# Patient Record
Sex: Female | Born: 1965
Health system: Southern US, Community
[De-identification: ages and names within clinical notes are randomized; demographics above are authoritative.]

## PROBLEM LIST (undated history)

## (undated) DIAGNOSIS — Z8719 Personal history of other diseases of the digestive system: Secondary | ICD-10-CM

## (undated) DIAGNOSIS — E119 Type 2 diabetes mellitus without complications: Secondary | ICD-10-CM

## (undated) DIAGNOSIS — E538 Deficiency of other specified B group vitamins: Secondary | ICD-10-CM

## (undated) DIAGNOSIS — F329 Major depressive disorder, single episode, unspecified: Secondary | ICD-10-CM

## (undated) DIAGNOSIS — F32A Depression, unspecified: Secondary | ICD-10-CM

## (undated) DIAGNOSIS — K56609 Unspecified intestinal obstruction, unspecified as to partial versus complete obstruction: Secondary | ICD-10-CM

## (undated) DIAGNOSIS — J9610 Chronic respiratory failure, unspecified whether with hypoxia or hypercapnia: Secondary | ICD-10-CM

## (undated) DIAGNOSIS — G4730 Sleep apnea, unspecified: Secondary | ICD-10-CM

## (undated) DIAGNOSIS — M1990 Unspecified osteoarthritis, unspecified site: Secondary | ICD-10-CM

## (undated) DIAGNOSIS — Z8489 Family history of other specified conditions: Secondary | ICD-10-CM

## (undated) DIAGNOSIS — E785 Hyperlipidemia, unspecified: Secondary | ICD-10-CM

## (undated) DIAGNOSIS — T4145XA Adverse effect of unspecified anesthetic, initial encounter: Secondary | ICD-10-CM

## (undated) DIAGNOSIS — M479 Spondylosis, unspecified: Secondary | ICD-10-CM

## (undated) DIAGNOSIS — G43719 Chronic migraine without aura, intractable, without status migrainosus: Secondary | ICD-10-CM

## (undated) DIAGNOSIS — G4733 Obstructive sleep apnea (adult) (pediatric): Secondary | ICD-10-CM

## (undated) DIAGNOSIS — K219 Gastro-esophageal reflux disease without esophagitis: Secondary | ICD-10-CM

## (undated) DIAGNOSIS — K2270 Barrett's esophagus without dysplasia: Secondary | ICD-10-CM

## (undated) DIAGNOSIS — Z9884 Bariatric surgery status: Secondary | ICD-10-CM

## (undated) DIAGNOSIS — F319 Bipolar disorder, unspecified: Secondary | ICD-10-CM

## (undated) DIAGNOSIS — G43909 Migraine, unspecified, not intractable, without status migrainosus: Secondary | ICD-10-CM

## (undated) DIAGNOSIS — J189 Pneumonia, unspecified organism: Secondary | ICD-10-CM

## (undated) DIAGNOSIS — F419 Anxiety disorder, unspecified: Secondary | ICD-10-CM

## (undated) DIAGNOSIS — D649 Anemia, unspecified: Secondary | ICD-10-CM

## (undated) DIAGNOSIS — I10 Essential (primary) hypertension: Secondary | ICD-10-CM

## (undated) DIAGNOSIS — E559 Vitamin D deficiency, unspecified: Secondary | ICD-10-CM

## (undated) DIAGNOSIS — K5909 Other constipation: Secondary | ICD-10-CM

## (undated) DIAGNOSIS — H04129 Dry eye syndrome of unspecified lacrimal gland: Secondary | ICD-10-CM

## (undated) DIAGNOSIS — E039 Hypothyroidism, unspecified: Secondary | ICD-10-CM

## (undated) DIAGNOSIS — K3184 Gastroparesis: Secondary | ICD-10-CM

## (undated) HISTORY — DX: Obstructive sleep apnea (adult) (pediatric): G47.33

## (undated) HISTORY — DX: Sleep apnea, unspecified: G47.30

## (undated) HISTORY — DX: Chronic migraine without aura, intractable, without status migrainosus: G43.719

## (undated) HISTORY — DX: Pneumonia, unspecified organism: J18.9

## (undated) HISTORY — DX: Chronic respiratory failure, unspecified whether with hypoxia or hypercapnia: J96.10

## (undated) HISTORY — PX: TONSILLECTOMY: SUR1361

## (undated) HISTORY — DX: Barrett's esophagus without dysplasia: K22.70

## (undated) HISTORY — DX: Migraine, unspecified, not intractable, without status migrainosus: G43.909

## (undated) HISTORY — DX: Vitamin D deficiency, unspecified: E55.9

## (undated) HISTORY — PX: HERNIA REPAIR: SHX51

## (undated) HISTORY — DX: Other constipation: K59.09

## (undated) HISTORY — DX: Deficiency of other specified B group vitamins: E53.8

## (undated) HISTORY — DX: Hypothyroidism, unspecified: E03.9

## (undated) HISTORY — DX: Morbid (severe) obesity due to excess calories: E66.01

## (undated) HISTORY — DX: Hyperlipidemia, unspecified: E78.5

## (undated) HISTORY — DX: Dry eye syndrome of unspecified lacrimal gland: H04.129

## (undated) HISTORY — PX: COLONOSCOPY: SHX174

## (undated) HISTORY — DX: Unspecified osteoarthritis, unspecified site: M19.90

## (undated) HISTORY — DX: Gastroparesis: K31.84

## (undated) HISTORY — DX: Essential (primary) hypertension: I10

## (undated) HISTORY — DX: Bariatric surgery status: Z98.84

## (undated) HISTORY — PX: GASTROSTOMY TUBE PLACEMENT: SHX655

## (undated) HISTORY — DX: Bipolar disorder, unspecified: F31.9

---
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
## 1999-05-31 ENCOUNTER — Emergency Department (HOSPITAL_COMMUNITY): Admission: EM | Admit: 1999-05-31 | Discharge: 1999-05-31 | Payer: Self-pay | Admitting: Emergency Medicine

## 2003-11-20 ENCOUNTER — Other Ambulatory Visit: Admission: RE | Admit: 2003-11-20 | Discharge: 2003-11-20 | Payer: Self-pay | Admitting: Obstetrics and Gynecology

## 2003-12-13 HISTORY — PX: CHOLECYSTECTOMY: SHX55

## 2004-02-27 ENCOUNTER — Ambulatory Visit (HOSPITAL_COMMUNITY): Admission: RE | Admit: 2004-02-27 | Discharge: 2004-02-27 | Payer: Self-pay | Admitting: Family Medicine

## 2004-03-17 ENCOUNTER — Ambulatory Visit (HOSPITAL_COMMUNITY): Admission: RE | Admit: 2004-03-17 | Discharge: 2004-03-17 | Payer: Self-pay

## 2004-03-17 ENCOUNTER — Encounter (INDEPENDENT_AMBULATORY_CARE_PROVIDER_SITE_OTHER): Payer: Self-pay | Admitting: *Deleted

## 2004-09-16 ENCOUNTER — Ambulatory Visit (HOSPITAL_COMMUNITY): Admission: RE | Admit: 2004-09-16 | Discharge: 2004-09-16 | Payer: Self-pay | Admitting: Internal Medicine

## 2004-10-19 ENCOUNTER — Ambulatory Visit: Payer: Self-pay | Admitting: Gastroenterology

## 2004-10-25 ENCOUNTER — Ambulatory Visit (HOSPITAL_COMMUNITY): Admission: RE | Admit: 2004-10-25 | Discharge: 2004-10-25 | Payer: Self-pay | Admitting: Gastroenterology

## 2004-11-22 ENCOUNTER — Other Ambulatory Visit: Admission: RE | Admit: 2004-11-22 | Discharge: 2004-11-22 | Payer: Self-pay | Admitting: Obstetrics and Gynecology

## 2004-12-14 ENCOUNTER — Ambulatory Visit: Payer: Self-pay | Admitting: Gastroenterology

## 2004-12-20 ENCOUNTER — Ambulatory Visit: Payer: Self-pay | Admitting: Gastroenterology

## 2004-12-29 ENCOUNTER — Ambulatory Visit: Payer: Self-pay | Admitting: Gastroenterology

## 2005-01-06 ENCOUNTER — Ambulatory Visit: Payer: Self-pay | Admitting: Gastroenterology

## 2005-01-11 ENCOUNTER — Ambulatory Visit (HOSPITAL_COMMUNITY): Admission: RE | Admit: 2005-01-11 | Discharge: 2005-01-11 | Payer: Self-pay | Admitting: Gastroenterology

## 2005-02-08 ENCOUNTER — Ambulatory Visit: Payer: Self-pay | Admitting: Gastroenterology

## 2005-06-16 ENCOUNTER — Other Ambulatory Visit: Admission: RE | Admit: 2005-06-16 | Discharge: 2005-06-16 | Payer: Self-pay | Admitting: Obstetrics and Gynecology

## 2005-07-25 ENCOUNTER — Ambulatory Visit: Admission: RE | Admit: 2005-07-25 | Discharge: 2005-07-25 | Payer: Self-pay | Admitting: Family Medicine

## 2005-07-27 ENCOUNTER — Ambulatory Visit: Payer: Self-pay | Admitting: Pulmonary Disease

## 2005-08-19 ENCOUNTER — Ambulatory Visit (HOSPITAL_COMMUNITY): Admission: RE | Admit: 2005-08-19 | Discharge: 2005-08-19 | Payer: Self-pay | Admitting: Family Medicine

## 2005-11-09 ENCOUNTER — Encounter: Admission: RE | Admit: 2005-11-09 | Discharge: 2005-11-28 | Payer: Self-pay | Admitting: Orthopedic Surgery

## 2005-12-01 ENCOUNTER — Other Ambulatory Visit: Admission: RE | Admit: 2005-12-01 | Discharge: 2005-12-01 | Payer: Self-pay | Admitting: Obstetrics and Gynecology

## 2010-07-14 ENCOUNTER — Encounter: Admission: RE | Admit: 2010-07-14 | Discharge: 2010-07-14 | Payer: Self-pay | Admitting: Family Medicine

## 2010-09-05 ENCOUNTER — Encounter: Payer: Self-pay | Admitting: Pulmonary Disease

## 2010-09-05 ENCOUNTER — Emergency Department (HOSPITAL_COMMUNITY): Admission: EM | Admit: 2010-09-05 | Discharge: 2010-09-05 | Payer: Self-pay | Admitting: Emergency Medicine

## 2010-09-05 ENCOUNTER — Ambulatory Visit: Admission: RE | Admit: 2010-09-05 | Discharge: 2010-09-05 | Payer: Self-pay | Admitting: Family Medicine

## 2010-09-09 ENCOUNTER — Ambulatory Visit (HOSPITAL_COMMUNITY): Admission: RE | Admit: 2010-09-09 | Discharge: 2010-09-09 | Payer: Self-pay | Admitting: Family Medicine

## 2010-09-27 ENCOUNTER — Ambulatory Visit: Payer: Self-pay | Admitting: Internal Medicine

## 2010-09-27 DIAGNOSIS — I10 Essential (primary) hypertension: Secondary | ICD-10-CM

## 2010-09-27 DIAGNOSIS — G43909 Migraine, unspecified, not intractable, without status migrainosus: Secondary | ICD-10-CM | POA: Insufficient documentation

## 2010-09-27 DIAGNOSIS — J9610 Chronic respiratory failure, unspecified whether with hypoxia or hypercapnia: Secondary | ICD-10-CM | POA: Insufficient documentation

## 2010-09-27 DIAGNOSIS — E1159 Type 2 diabetes mellitus with other circulatory complications: Secondary | ICD-10-CM

## 2010-09-27 DIAGNOSIS — G4733 Obstructive sleep apnea (adult) (pediatric): Secondary | ICD-10-CM | POA: Insufficient documentation

## 2010-11-08 ENCOUNTER — Ambulatory Visit: Payer: Self-pay | Admitting: Internal Medicine

## 2010-12-09 ENCOUNTER — Telehealth (INDEPENDENT_AMBULATORY_CARE_PROVIDER_SITE_OTHER): Payer: Self-pay | Admitting: *Deleted

## 2010-12-14 ENCOUNTER — Encounter: Payer: Self-pay | Admitting: Internal Medicine

## 2010-12-16 ENCOUNTER — Encounter: Payer: Self-pay | Admitting: Internal Medicine

## 2010-12-16 ENCOUNTER — Telehealth: Payer: Self-pay | Admitting: Internal Medicine

## 2010-12-27 ENCOUNTER — Ambulatory Visit
Admission: RE | Admit: 2010-12-27 | Discharge: 2010-12-27 | Payer: Self-pay | Source: Home / Self Care | Attending: Internal Medicine | Admitting: Internal Medicine

## 2010-12-27 ENCOUNTER — Telehealth (INDEPENDENT_AMBULATORY_CARE_PROVIDER_SITE_OTHER): Payer: Self-pay | Admitting: *Deleted

## 2010-12-31 ENCOUNTER — Ambulatory Visit
Admission: RE | Admit: 2010-12-31 | Discharge: 2010-12-31 | Payer: Self-pay | Source: Home / Self Care | Attending: Pulmonary Disease | Admitting: Pulmonary Disease

## 2010-12-31 DIAGNOSIS — G8929 Other chronic pain: Secondary | ICD-10-CM | POA: Insufficient documentation

## 2010-12-31 DIAGNOSIS — K3184 Gastroparesis: Secondary | ICD-10-CM | POA: Insufficient documentation

## 2010-12-31 DIAGNOSIS — M25562 Pain in left knee: Secondary | ICD-10-CM

## 2010-12-31 DIAGNOSIS — M25561 Pain in right knee: Secondary | ICD-10-CM

## 2010-12-31 DIAGNOSIS — J309 Allergic rhinitis, unspecified: Secondary | ICD-10-CM | POA: Insufficient documentation

## 2010-12-31 DIAGNOSIS — K2270 Barrett's esophagus without dysplasia: Secondary | ICD-10-CM | POA: Insufficient documentation

## 2011-01-13 NOTE — Progress Notes (Signed)
  Phone Note Other Incoming   Request: Send information Summary of Call: Request for records received from [employer]. Request forwarded to [employer]. 12/12/2008 to present. Pulmonary

## 2011-01-13 NOTE — Miscellaneous (Signed)
Summary: Orders Update  Clinical Lists Changes  Orders: Added new Referral order of DME Referral (DME) - Signed 

## 2011-01-13 NOTE — Progress Notes (Signed)
Summary: ?cpap/o2 >  check ono on cpap  Phone Note Call from Patient Call back at Home Phone [phone]   Caller: Patient Call For: wert Reason for Call: Talk to Nurse Summary of Call: Patient calling saying that when she wakes in the morning her chest feels tight and sore.  Patient on cpap and is wondering if adding oxygen at night along with cpap would help with this.   Initial call taken by: Lehman Prom,  December 09, 2010 8:13 AM  Follow-up for Phone Call        Pt staets she has been waking up in the morning and her chest has been really tight in the mornings. She states the tightness goes away after a few hours. Pt wears a cpap at night and wants to know if she needs to have oxygen bleed into the machine. It doesnt look like we ordered her cpap, pt states her pcp ordered it originally but she wants to ask Dr. Sherene Sires his pinion about the oxygen. Pt aware MW out of office and ok to wait for his response. Please advise.Carron Curie CMA  December 09, 2010 9:33 AM I ordered overnight 02 on cpap today but need to be sure she has ov next available to review meds/ blood pressure Follow-up by: Nyoka Cowden MD,  December 10, 2010 9:43 AM  Additional Follow-up for Phone Call Additional follow up Details #1::        Order faxed to [facility] to arrange ONO on CPAP. Pt is aware of this order. Rhonda Cobb  December 10, 2010 10:00 AM  Pt is sch for f/u on 12/27/2010 @ 10am w/ MW. Michel Bickers [facility]  December 10, 2010 10:09 AM     Appended Document: ?cpap/o2 >  check ono on cpap study done 12/13/09 shows 1h and 30 min desat on cpap/ RA so add 2lpm and referred to sleep doc

## 2011-01-13 NOTE — Assessment & Plan Note (Signed)
Summary: Pulmonary/ ext f/u ov with change lisnopril > diovan 160/25    Copy to:  Belva Agee, PA Primary Provider/Referring Provider:  Belva Agee, PA  CC:  DOE- improved.  History of Present Illness: 45  yowf never smoker with morbid obesity  placed 02 2  effective 08/2010  September 27, 2010  1st pulmonary office eval  new onset bilateral leg swelling x sev months assoc excess hypersomnolence and better since started CPAP 2 weeks.  eval with ct chest showing atx bases and referred for pulmonary evaluation by Belva Agee with Dr [facility] office.  swelling better.  doe x > room to room . rec Stop fish oil Incentive spirometry 4 x daily Please schedule a follow-up appointment in 6 weeks, sooner if needed with PFT's  Weight control  November 08, 2010 with pft's cc doe better, some sensation of pnds, cough, tickle in throat while on ace. Pt denies any significant sore throat, dysphagia, itching, sneezing,  nasal congestion or excess secretions,  fever, chills, sweats, unintended wt loss, pleuritic or exertional cp, hempoptysis, change in activity tolerance  orthopnea pnd or leg swelling. Pt also denies any obvious fluctuation in symptoms with weather or environmental change or other alleviating or aggravating factors.       Current Medications (verified): 1)  Lisinopril-Hydrochlorothiazide 20-25 Mg Tabs (Lisinopril-Hydrochlorothiazide) .Marland Kitchen.. 1 Once Daily 2)  Lasix 20 Mg Tabs (Furosemide) .Marland Kitchen.. 1 Once Daily 3)  Antara 130 Mg Caps (Fenofibrate Micronized) .Marland Kitchen.. 1 Once Daily 4)  Amitiza 24 Mcg Caps (Lubiprostone) .Marland Kitchen.. 1 Two Times A Day 5)  Ultram 50 Mg Tabs (Tramadol Hcl) .Marland Kitchen.. 1 To 2 Every 4-6 Hrs As Needed 6)  Ra One Daily Multi-Vitamin  Tabs (Multiple Vitamin) .Marland Kitchen.. 1 Once Daily 7)  Trazodone Hcl 100 Mg Tabs (Trazodone Hcl) .... 2 At Bedtime 8)  Omeprazole 40 Mg Cpdr (Omeprazole) .Marland Kitchen.. 1 Once Daily 9)  Diclofenac Sodium 75 Mg Tbec (Diclofenac Sodium) .Marland Kitchen.. 1 Two Times A Day With Meal 10)  Nyamyc  100000 Unit/gm Powd (Nystatin) .... Apply To Area Two Times A Day 11)  Hydroxyzine Hcl 25 Mg Tabs (Hydroxyzine Hcl) .Marland Kitchen.. 1 Three Times A Day 12)  Sudafed 30 Mg Tabs (Pseudoephedrine Hcl) .... Per Bottle Directions As Needed  Allergies (verified): 1)  ! Codeine 2)  ! * Nizoral 3)  ! * Statins  Past History:  Past Medical History: Mobid Obesity      -  weighed 175  2007  OSA  dx 08/2010 >> cpap and daytime 02 ordered by Dr Kathi Der office Dyspnea      - PFT's nl November 08, 2010 x low erv      - Sats ok on 2lpm walking November 08, 2010   Vital Signs:  Patient profile:   45 year old female Height:      66 inches Weight:      326 pounds BMI:     52.81 O2 Sat:      96 % on Room air Temp:     97.9 degrees F oral Pulse rate:   97 / minute BP sitting:   114 / 78  (left arm) Cuff size:   large  Vitals Entered By: Vernie Murders (November 08, 2010 10:47 AM)  O2 Flow:  Room air  Serial Vital Signs/Assessments:  Comments: 12:04 PM Ambulatory Pulse Oximetry  Resting; HR__99___    02 Sat__97%2lpm___  Lap1 (185 feet)   HR_126____   02 Sat__90%2lpm___ Lap2 (185 feet)   HR_101____   02  Sat__96%2lpm___    Lap3 (185 feet)   HR_____   02 Sat_____  ___Test Completed without Difficulty _x__Test Stopped due ZO:XWRU knee pain   By: Vernie Murders    Physical Exam  Additional Exam:  massively obese wf nad wt  345 September 27, 2010  >> 326 November 08, 2010  HEENT: nl dentition, turbinates, and orophanx. Nl external ear canals without cough reflex NECK :  without JVD/Nodes/TM/ nl carotid upstrokes bilaterally LUNGS: no acc muscle use, clear to A and P bilaterally without cough on insp or exp maneuvers CV:  RRR  no s3 or murmur or increase in P2, no edema  ABD:   massive with limited excursion   MS:  warm without deformities, calf tenderness, cyanosis or clubbing SKIN: warm and dry without lesions   NEURO:  alert, approp, no deficits     Impression &  Recommendations:  Problem # 1:  RESPIRATORY FAILURE, CHRONIC (ICD-518.83)  c/w obesity effects by pft's with nl lung vol and nl dlco but disproportionate reduction in erv and well compensated on 2lpm with activity and cpap at night  Orders: Est. Patient Level IV (99214) Pulse Oximetry, Ambulatory (04540)  Problem # 2:  HYPERTENSION (ICD-401.9)  The following medications were removed from the medication list:    Lisinopril-hydrochlorothiazide 20-25 Mg Tabs (Lisinopril-hydrochlorothiazide) .Marland Kitchen... 1 once daily Her updated medication list for this problem includes:    Lasix 20 Mg Tabs (Furosemide) .Marland Kitchen... 1 once daily    Diovan Hct 160-25 Mg Tabs (Valsartan-hydrochlorothiazide) ..... One by mouth daily  cough on ace is ace related until proven otherwise.  ACE inhibitors are probably fueling this problem because they inhibit the metabolism of Upper Air Bradykinin,  a potent mediator of upper airways inflammation. If they are the "fuel" then something else usually is the "spark" (eg viral uri, post nasal drainage, or reflux). In the era of ARB equivalency, at least in the short run, I recommend the ACE be stopped for a six week trial basis. If the problem resolves, the ACE could still be restarted depending on the severity of the severity of the original problem  Orders: Est. Patient Level IV (98119)  Medications Added to Medication List This Visit: 1)  Trazodone Hcl 100 Mg Tabs (Trazodone hcl) .... 2 at bedtime 2)  Sudafed 30 Mg Tabs (Pseudoephedrine hcl) .... Per bottle directions as needed 3)  Diovan Hct 160-25 Mg Tabs (Valsartan-hydrochlorothiazide) .... One by mouth daily  Patient Instructions: 1)  Keep working on your weight, you are making great progress 2)  Try Diovan 14782  one daily in place of the lisinopril for a 6 week free trial to see if the cough resolves 3)  Please schedule a follow-up appointment in 6 weeks, sooner if needed

## 2011-01-13 NOTE — Progress Notes (Signed)
Summary: returning call  Phone Note Call from Patient Call back at Home Phone [phone]   Caller: Patient Call For: Leila Schuff Reason for Call: Talk to Nurse Summary of Call: Patient returning call.  Patient says it could be about ONO. Initial call taken by: Lehman Prom,  December 16, 2010 11:45 AM  Follow-up for Phone Call        Pt states she had a message left on her vmail and was not sure who was calling. States she figured out it was Almyra Free to confirm her upcoming appointment with [facility]. No further action needed. Zackery Barefoot CMA  December 16, 2010 2:29 PM

## 2011-01-13 NOTE — Assessment & Plan Note (Signed)
Summary: Pulmonary/ final f/u ov for obesity    Copy to:  Belva Agee, PA Primary Provider/Referring Provider:  Belva Agee, PA  CC:  DOE- the same.  History of Present Illness: 71  yowf never smoker with morbid obesity  placed 02 2  effective 08/2010  September 27, 2010  1st pulmonary office eval  new onset bilateral leg swelling x sev months assoc excess hypersomnolence and better since started CPAP 2 weeks.  eval with ct chest showing atx bases and referred for pulmonary evaluation by Belva Agee with Dr [facility] office.  swelling better.  doe x > room to room . rec Stop fish oil Incentive spirometry 4 x daily Please schedule a follow-up appointment in 6 weeks, sooner if needed with PFT's  Weight control  November 08, 2010 with pft's cc doe better, some sensation of pnds, cough, tickle in throat while on ace Keep working on your weight, you are making great progress Try Diovan 16109  one daily in place of the lisinopril for a 6 week free trial to see if the cough resolves    December 27, 2010 ov c/o doe no cough.  Pt denies any significant sore throat, dysphagia, itching, sneezing,  nasal congestion or excess secretions,  fever, chills, sweats, unintended wt loss, pleuritic or exertional cp, hempoptysis, change in activity tolerance  orthopnea pnd or leg swelling    Current Medications (verified): 1)  Lasix 20 Mg Tabs (Furosemide) .Marland Kitchen.. 1 Once Daily 2)  Antara 130 Mg Caps (Fenofibrate Micronized) .Marland Kitchen.. 1 Once Daily 3)  Amitiza 24 Mcg Caps (Lubiprostone) .Marland Kitchen.. 1 Two Times A Day 4)  Ultram 50 Mg Tabs (Tramadol Hcl) .Marland Kitchen.. 1 To 2 Every 4-6 Hrs As Needed 5)  Ra One Daily Multi-Vitamin  Tabs (Multiple Vitamin) .Marland Kitchen.. 1 Once Daily 6)  Omeprazole 40 Mg Cpdr (Omeprazole) .Marland Kitchen.. 1 Once Daily 7)  Diclofenac Sodium 75 Mg Tbec (Diclofenac Sodium) .Marland Kitchen.. 1 Two Times A Day With Meal 8)  Nyamyc 100000 Unit/gm Powd (Nystatin) .... Apply To Area Two Times A Day 9)  Hydroxyzine Hcl 25 Mg Tabs (Hydroxyzine Hcl)  .Marland Kitchen.. 1 Three Times A Day 10)  Sudafed 30 Mg Tabs (Pseudoephedrine Hcl) .... Per Bottle Directions As Needed 11)  Diovan Hct 160-25 Mg  Tabs (Valsartan-Hydrochlorothiazide) .... One By Mouth Daily 12)  Vitamin D (Ergocalciferol) 50000 Unit Caps (Ergocalciferol) .Marland Kitchen.. 1 Every Wk  Allergies (verified): 1)  ! Codeine 2)  ! * Nizoral 3)  ! * Statins  Past History:  Past Medical History: Mobid Obesity      -  weighed 175  2007       -  5'  6" so [employer] wt  =   179  for BMI < 30  OSA  dx 08/2010 >> cpap and daytime 02 ordered by Dr Kathi Der office Dyspnea      - CTangiogram of chest neg for pulmonary emboli/ ild  09/09/10      - PFT's nl November 08, 2010 x low erv c/w effects of obesity      - Sats ok on 2lpm walking November 08, 2010  Cough     - Resolved off ace December 27, 2010   Vital Signs:  Patient profile:   45 year old female Weight:      333 pounds O2 Sat:      95 % on Room air Temp:     98.0 degrees F oral Pulse rate:   76 / minute BP sitting:   126 /  78  (left arm)  Vitals Entered By: Vernie Murders (December 27, 2010 9:46 AM)  O2 Flow:  Room air  Physical Exam  Additional Exam:  massively obese wf nad wt  345 September 27, 2010  >> 326 November 08, 2010 >  333 December 27, 2010  HEENT: nl dentition, turbinates, and orophanx. Nl external ear canals without cough reflex NECK :  without JVD/Nodes/TM/ nl carotid upstrokes bilaterally LUNGS: no acc muscle use, clear to A and P bilaterally without cough on insp or exp maneuvers CV:  RRR  no s3 or murmur or increase in P2, no edema  ABD:   massive with limited excursion   MS:  warm without deformities, calf tenderness, cyanosis or clubbing    Impression & Recommendations:  Problem # 1:  RESPIRATORY FAILURE, CHRONIC (ICD-518.83)  c/w obesity effects by pft's with nl lung vol and nl dlco but disproportionate reduction in erv and well compensated on 2lpm with activity and cpap at night   Weight control is a matter of  calorie balance which needs to be tilted in the pt's favor by eating less and exercising more.  Specifically, I recommended  exercise at a level where pt  is short of breath but not out of breath 30 minutes daily.  If not losing weight on this program, I would strongly recommend pt see a nutritionist with a food diary recorded for two weeks prior to the visit.     No further pulmonary f/u planned     Problem # 2:  HYPERTENSION (ICD-401.9)  Her updated medication list for this problem includes:    Lasix 20 Mg Tabs (Furosemide) .Marland Kitchen... 1 once daily    Diovan Hct 160-25 Mg Tabs (Valsartan-hydrochlorothiazide) ..... One by mouth daily   cough resolved off ace so rec avoid this class with f/u by Belva Agee Dr [facility] office     Medications Added to Medication List This Visit: 1)  Vitamin D (ergocalciferol) 50000 Unit Caps (Ergocalciferol) .Marland Kitchen.. 1 every wk  Other Orders: Est. Patient Level IV (62952)  Patient Instructions: 1)  See Dr Tania Ade re longterm blood pressure medication choice but you need to avoid ace inhibitors 2)  Keep Appt to see Clance for sleep and we will see you in the pulmonary clinic as needed

## 2011-01-13 NOTE — Assessment & Plan Note (Signed)
Summary: Pulmonary/ new pt ov with walking sats ra= 86 p 185 ft   Visit Type:  Initial Consult Copy to:  Belva Agee, PA Primary Provider/Referring Provider:  Belva Agee, PA  CC:  Abnormal CT Chest.  History of Present Illness: 64 yowf never smoker with morbid obesity  placed 02 2 24 hours per day effective 08/2010  September 27, 2010  1st pulmonary office eval  new onset bilateral leg swelling x sev months assoc excess hypersomnolence and better since started CPAP 2 weeks.  eval with ct chest showing atx bases and referred for pulmonary evaluation by Belva Agee with Dr [facility] office.  swelling better.  doe x > room to room .  Pt denies any significant sore throat, dysphagia, itching, sneezing,  nasal congestion or excess secretions,  fever, chills, sweats, unintended wt loss, pleuritic or exertional cp, hempoptysis, change in activity tolerance  orthopnea pnd. Pt also denies any obvious fluctuation in symptoms with weather or environmental change or other alleviating or aggravating factors.       Current Medications (verified): 1)  Lisinopril-Hydrochlorothiazide 20-25 Mg Tabs (Lisinopril-Hydrochlorothiazide) .Marland Kitchen.. 1 Once Daily 2)  Lasix 20 Mg Tabs (Furosemide) .Marland Kitchen.. 1 Once Daily 3)  Antara 130 Mg Caps (Fenofibrate Micronized) .Marland Kitchen.. 1 Once Daily 4)  Amitiza 24 Mcg Caps (Lubiprostone) .Marland Kitchen.. 1 Two Times A Day 5)  Ultram 50 Mg Tabs (Tramadol Hcl) .Marland Kitchen.. 1 To 2 Every 4-6 Hrs As Needed 6)  Ra One Daily Multi-Vitamin  Tabs (Multiple Vitamin) .Marland Kitchen.. 1 Once Daily 7)  Cymbalta 30 Mg Cpep (Duloxetine Hcl) .Marland Kitchen.. 1 Once Daily 8)  Trazodone Hcl 100 Mg Tabs (Trazodone Hcl) .... 2 At Bedtime 9)  Omeprazole 40 Mg Cpdr (Omeprazole) .Marland Kitchen.. 1 Once Daily 10)  Diclofenac Sodium 75 Mg Tbec (Diclofenac Sodium) .Marland Kitchen.. 1 Two Times A Day With Meal 11)  Fish Oil Double Strength 1200 Mg Caps (Omega-3 Fatty Acids) .... 4 Once Daily 12)  Nyamyc 100000 Unit/gm Powd (Nystatin) .... Apply To Area Two Times A Day 13)  Hydroxyzine Hcl  25 Mg Tabs (Hydroxyzine Hcl) .Marland Kitchen.. 1 Three Times A Day  Allergies (verified): 1)  ! Codeine 2)  ! * Nizoral 3)  ! * Statins  Past History:  Past Medical History: Mobid Obesity      -  weighed 175  2007  OSA  dx 08/2010 >> cpap and daytime 02 ordered by Dr Kathi Der office  Past Surgical History: Cholecystectomy 2005 Appendectomy age 31 Tonsillectomy age 59  Family History: Asthma- Father and SIster Allergies- Brother and Sister Heart Dz- Father had enlarged heart Colon CA- Sister  Social History: Single with no children Disabled Never smoker No ETOH  Review of Systems       The patient complains of shortness of breath with activity, productive cough, indigestion, abdominal pain, headaches, anxiety, depression, hand/feet swelling, and change in color of mucus.  The patient denies shortness of breath at rest, non-productive cough, coughing up blood, chest pain, irregular heartbeats, acid heartburn, loss of appetite, weight change, difficulty swallowing, sore throat, tooth/dental problems, nasal congestion/difficulty breathing through nose, sneezing, itching, ear ache, joint stiffness or pain, rash, and fever.    Vital Signs:  Patient profile:   45 year old female Height:      65 inches Weight:      345 pounds BMI:     57.62 O2 Sat:      97 % on 2 L/min Temp:     98.1 degrees F oral Pulse rate:  75 / minute BP sitting:   126 / 76  (left arm) Cuff size:   large  Vitals Entered By: Vernie Murders (September 27, 2010 11:28 AM)  O2 Flow:  2 L/min  Serial Vital Signs/Assessments:  Comments: Ambulatory Pulse Oximetry  Resting; HR__76___    02 Sat_98%RA___  Lap1 (185 feet)   HR__103___   02 Sat_86%RA___ Pt recovered to 99%2liters P-97 Lap2 (185 feet)   HR__102___   02 Sat__96%2 liters___    Lap3 (185 feet)   HR__111___   02 Sat__95%2liters___  _x__Test Completed without Difficulty Zackery Barefoot CMA  September 27, 2010 12:17 PM  ___Test Stopped due to:   By: Zackery Barefoot CMA    Physical Exam  Additional Exam:  massively obese wf nad wt  345 September 27, 2010  HEENT: nl dentition, turbinates, and orophanx. Nl external ear canals without cough reflex NECK :  without JVD/Nodes/TM/ nl carotid upstrokes bilaterally LUNGS: no acc muscle use, clear to A and P bilaterally without cough on insp or exp maneuvers CV:  RRR  no s3 or murmur or increase in P2, no edema  ABD:   massive with limited excursion   MS:  warm without deformities, calf tenderness, cyanosis or clubbing SKIN: warm and dry without lesions   NEURO:  alert, approp, no deficits     CT of Chest  Procedure date:  09/09/2010  Findings:      Dependent atx and large Hiatal  hernia  Impression & Recommendations:  Problem # 1:  RESPIRATORY FAILURE, CHRONIC (ICD-518.83)  CT c/w poor basilar v/q matching from obesity; no evidence PE though she is at risk   Weight control is a matter of calorie balance which needs to be tilted in the pt's favor by eating less and exercising more.  Specifically, I recommended  exercise at a level where pt  is short of breath but not out of breath 30 minutes daily.  If not losing weight on this program, I would strongly recommend pt see a nutritionist with a food diary recorded for two weeks prior to the visit.     Needs to wear 02 with anything more than room to room walking  Orders: New Patient Level V (78469) Pulse Oximetry, Ambulatory (62952)  Problem # 2:  OBSTRUCTIVE SLEEP APNEA (ICD-327.23)  rx per Belva Agee with cpap  Orders: New Patient Level V (84132)  Medications Added to Medication List This Visit: 1)  Lisinopril-hydrochlorothiazide 20-25 Mg Tabs (Lisinopril-hydrochlorothiazide) .Marland Kitchen.. 1 once daily 2)  Lasix 20 Mg Tabs (Furosemide) .Marland Kitchen.. 1 once daily 3)  Antara 130 Mg Caps (Fenofibrate micronized) .Marland Kitchen.. 1 once daily 4)  Amitiza 24 Mcg Caps (Lubiprostone) .Marland Kitchen.. 1 two times a day 5)  Ultram 50 Mg Tabs (Tramadol hcl) .Marland Kitchen.. 1 to 2 every 4-6  hrs as needed 6)  Ra One Daily Multi-vitamin Tabs (Multiple vitamin) .Marland Kitchen.. 1 once daily 7)  Cymbalta 30 Mg Cpep (Duloxetine hcl) .Marland Kitchen.. 1 once daily 8)  Trazodone Hcl 100 Mg Tabs (Trazodone hcl) .... 2 at bedtime 9)  Omeprazole 40 Mg Cpdr (Omeprazole) .Marland Kitchen.. 1 once daily 10)  Diclofenac Sodium 75 Mg Tbec (Diclofenac sodium) .Marland Kitchen.. 1 two times a day with meal 11)  Fish Oil Double Strength 1200 Mg Caps (Omega-3 fatty acids) .... 4 once daily 12)  Nyamyc 100000 Unit/gm Powd (Nystatin) .... Apply to area two times a day 13)  Hydroxyzine Hcl 25 Mg Tabs (Hydroxyzine hcl) .Marland Kitchen.. 1 three times a day  Patient Instructions: 1)  Stop  fish oil 2)  Incentive spirometry 4 x daily 3)  Please schedule a follow-up appointment in 6 weeks, sooner if needed with PFT's  4)  Weight control is simply a matter of calorie balance which needs to be tilted in your favor by eating less and exercising more.  To get the most out of exercise, you need to be continuously aware that you are short of breath, but never out of breath, for 30 minutes daily. As you improve, it will actually be easier for you to do the same amount in  30 minutes so always push to the level where you are short of breath.  If this does not result in gradual weight reduction,  I recommend  a nutritionist for a food nutrition.

## 2011-01-13 NOTE — Procedures (Signed)
Summary: Oximetry/[facility]  Oximetry/[facility]   Imported By: Sherian Rein 12/28/2010 10:14:49  _____________________________________________________________________  External Attachment:    Type:   Image     Comment:   External Document

## 2011-01-13 NOTE — Miscellaneous (Signed)
Summary: Orders Update pft charges  Clinical Lists Changes  Orders: Added new Service order of Carbon Monoxide diffusing w/capacity (94720) - Signed Added new Service order of Lung Volumes (94240) - Signed Added new Service order of Spirometry (Pre & Post) (94060) - Signed 

## 2011-01-19 NOTE — Assessment & Plan Note (Signed)
Summary: consult for managment of very severe osa   Copy to:  Sandrea Hughs Primary Provider/Referring Provider:  Belva Agee, PA  CC:  Sleep Consult.  History of Present Illness: The pt is a 45y/o female who I have been asked to see for management of severe osa.  The pt was diagnosed with osa in 2011 with AHI 111 events/hr, and was ultimately titrated to a cpap pressure of 18cm.  She currently is using cpap everynight, and wears oxygen at hs as well.  She has a full face mask that sometimes leaks, and is also having issues with her humidifier.  She feels that cpap aggrevates her sleep, and most likely is due to "mask flapping".  She goes to bed at 9-10pm, and arises at 4am to start her day.  She feels more rested since being on cpap, and although she has some residual sleepiness during the day with tv/reading, it is much improved.  She no longer falls asleep in church, and denies sleepiness with driving.  Her epworth score today is only 6, and she tells me that she has gained a "huge" amount of weight over the last 2 years (? > 100 lbs).  Medications Prior to Update: 1)  Lasix 20 Mg Tabs (Furosemide) .Marland Kitchen.. 1 Once Daily 2)  Antara 130 Mg Caps (Fenofibrate Micronized) .Marland Kitchen.. 1 Once Daily 3)  Amitiza 24 Mcg Caps (Lubiprostone) .Marland Kitchen.. 1 Two Times A Day 4)  Ultram 50 Mg Tabs (Tramadol Hcl) .Marland Kitchen.. 1 To 2 Every 4-6 Hrs As Needed 5)  Ra One Daily Multi-Vitamin  Tabs (Multiple Vitamin) .Marland Kitchen.. 1 Once Daily 6)  Omeprazole 40 Mg Cpdr (Omeprazole) .Marland Kitchen.. 1 Once Daily 7)  Diclofenac Sodium 75 Mg Tbec (Diclofenac Sodium) .Marland Kitchen.. 1 Two Times A Day With Meal 8)  Nyamyc 100000 Unit/gm Powd (Nystatin) .... Apply To Area Two Times A Day 9)  Hydroxyzine Hcl 25 Mg Tabs (Hydroxyzine Hcl) .Marland Kitchen.. 1 Three Times A Day 10)  Sudafed 30 Mg Tabs (Pseudoephedrine Hcl) .... Per Bottle Directions As Needed 11)  Diovan Hct 160-25 Mg  Tabs (Valsartan-Hydrochlorothiazide) .... One By Mouth Daily 12)  Vitamin D (Ergocalciferol) 50000 Unit Caps  (Ergocalciferol) .Marland Kitchen.. 1 Every Wk  Allergies (verified): 1)  ! Codeine 2)  ! * Nizoral 3)  ! * Statins  Past History:  Past Medical History: Mobid Obesity      -  weighed 175  2007       -  5'  6" so [employer] wt  =   179  for BMI < 30  OSA  dx 08/2010 >> cpap and daytime 02 ordered by Dr Kathi Der office; AHI 111/hr. Dyspnea      - CTangiogram of chest neg for pulmonary emboli/ ild  09/09/10      - PFT's nl November 08, 2010 x low erv c/w effects of obesity      - Sats ok on 2lpm walking November 08, 2010  Cough     - Resolved off ace December 27, 2010   BARRETTS ESOPHAGUS (ICD-530.85) OSTEOARTHRITIS (ICD-715.90) GASTROPARESIS (ICD-536.3) ALLERGIC RHINITIS (ICD-477.9) RESPIRATORY FAILURE, CHRONIC (ICD-518.83) MIGRAINE HEADACHE (ICD-346.90) HYPERTENSION (ICD-401.9) OBSTRUCTIVE SLEEP APNEA (ICD-327.23)--AHI 111/hr 2011.    Past Surgical History: Cholecystectomy 2005 Appendectomy 1978 Tonsillectomy age 45  Family History: Reviewed history from 09/27/2010 and no changes required. Asthma- Father and SIster Allergies- Brother and Sister, father, mother  Heart Dz- Father had enlarged heart Colon CA- Sister  Social History: Reviewed history from 09/27/2010 and no changes required. Single with  no children Disabled Never smoker No ETOH  Review of Systems       The patient complains of shortness of breath with activity, shortness of breath at rest, productive cough, weight change, headaches, nasal congestion/difficulty breathing through nose, anxiety, depression, hand/feet swelling, joint stiffness or pain, and change in color of mucus.  The patient denies non-productive cough, coughing up blood, chest pain, irregular heartbeats, acid heartburn, indigestion, loss of appetite, abdominal pain, difficulty swallowing, sore throat, tooth/dental problems, sneezing, itching, ear ache, rash, and fever.    Vital Signs:  Patient profile:   45 year old female Height:      66 inches Weight:       330.50 pounds BMI:     53.54 O2 Sat:      99 % on 2 LPM  Temp:     97.7 degrees F oral Pulse rate:   75 / minute BP sitting:   122 / 88  (left arm) Cuff size:   large  Vitals Entered By: Arman Filter LPN (December 31, 2010 3:02 PM)  O2 Flow:  2 LPM  CC: Sleep Consult Comments Medications reviewed with patient Arman Filter LPN  December 31, 2010 3:03 PM    Physical Exam  General:  morbidly obese female in nad Eyes:  PERRLA and EOMI.   Nose:  narrowed bilat, no discharge Mouth:  long palate, normal uvula, small posterior pharynx Neck:  large, no jvd, tmg, LN Lungs:  clear to auscultation Heart:  rrr, no mrg Abdomen:  soft and nontender, bs+ Extremities:  1+ edema , no cyanosis  pulses intact distally Neurologic:  alert and oriented, moves all 4.   Impression & Recommendations:  Problem # 1:  OBSTRUCTIVE SLEEP APNEA (ICD-327.23) the pt has very severe osa by her sleep study last year, and has been treated with cpap at a fairly high pressure.  She is tolerating the device surprisingly well given her pressure setting, but is having seal issues with her current mask.  This may explain her persistent daytime sleepiness issues despite wearing cpap compliantly.  At this point, would like to get her a better sealing mask and see how she does.  If continues to have issues, would wonder if her optimal pressure is accurate since it was a split night study?  My other thought is she may be in one of the small subsets of pts with severe osa that have persistent sleepiness to varying degress despite adequate treatment with cpap.    Other Orders: Consultation Level IV (95621) DME Referral (DME)  Patient Instructions: 1)  will get you a new full face mask, and get your humidifier fixed.  If not able to find the right fit, will refer you to the sleep center for fitting. 2)  work on weight loss 3)  will see you back in 4 weeks to check on your progress.

## 2011-01-28 ENCOUNTER — Ambulatory Visit (INDEPENDENT_AMBULATORY_CARE_PROVIDER_SITE_OTHER): Payer: Medicare Other | Admitting: Pulmonary Disease

## 2011-01-28 ENCOUNTER — Encounter: Payer: Self-pay | Admitting: Pulmonary Disease

## 2011-01-28 DIAGNOSIS — G4733 Obstructive sleep apnea (adult) (pediatric): Secondary | ICD-10-CM

## 2011-02-02 NOTE — Assessment & Plan Note (Signed)
Summary: rov for osa   Copy to:  Sandrea Hughs Primary Provider/Referring Provider:  Belva Agee, PA  CC:  Pt here for follow-up sleep. She has new full face mask and it is working well for her.  Pt is wearing mask x 5 hours nightly. Pt also c/o productive cough with green phlegm and nasal congestion x3 days. Marland Kitchen  History of Present Illness: the pt comes in today for f/u of her known very severe osa.  She has finally found a mask that fits well for her, and is wearing cpap compliantly.  She feels she is sleeping better, with improved daytime alertness.  She has developed a "head cold", but hasn't interfered with her cpap use as of yet.    Current Medications (verified): 1)  Lasix 20 Mg Tabs (Furosemide) .Marland Kitchen.. 1 Once Daily 2)  Antara 130 Mg Caps (Fenofibrate Micronized) .Marland Kitchen.. 1 Once Daily 3)  Amitiza 24 Mcg Caps (Lubiprostone) .Marland Kitchen.. 1 Two Times A Day 4)  Ultram 50 Mg Tabs (Tramadol Hcl) .Marland Kitchen.. 1 To 2 Every 4-6 Hrs As Needed 5)  Ra One Daily Multi-Vitamin  Tabs (Multiple Vitamin) .Marland Kitchen.. 1 Once Daily 6)  Omeprazole 40 Mg Cpdr (Omeprazole) .Marland Kitchen.. 1 Once Daily 7)  Diclofenac Sodium 75 Mg Tbec (Diclofenac Sodium) .Marland Kitchen.. 1 Two Times A Day With Meal 8)  Nyamyc 100000 Unit/gm Powd (Nystatin) .... Apply To Area Two Times A Day 9)  Sudafed 30 Mg Tabs (Pseudoephedrine Hcl) .... Per Bottle Directions As Needed 10)  Diovan Hct 160-25 Mg  Tabs (Valsartan-Hydrochlorothiazide) .... One By Mouth Daily 11)  Vitamin D (Ergocalciferol) 50000 Unit Caps (Ergocalciferol) .Marland Kitchen.. 1 Every Wk 12)  Zoloft 100 Mg Tabs (Sertraline Hcl) .... Take 1 Tablet By Mouth Once A Day 13)  Seroquel Xr 150 Mg Xr24h-Tab (Quetiapine Fumarate) .... Take 1 Tablet By Mouth Once A Day  Allergies (verified): 1)  ! Codeine 2)  ! * Nizoral 3)  ! * Statins  Review of Systems       The patient complains of productive cough, nasal congestion/difficulty breathing through nose, and change in color of mucus.  The patient denies shortness of breath with  activity, shortness of breath at rest, non-productive cough, coughing up blood, chest pain, irregular heartbeats, acid heartburn, indigestion, loss of appetite, weight change, abdominal pain, difficulty swallowing, sore throat, tooth/dental problems, headaches, sneezing, itching, ear ache, anxiety, depression, hand/feet swelling, joint stiffness or pain, rash, and fever.    Vital Signs:  Patient profile:   45 year old female Height:      66 inches Weight:      321.25 pounds O2 Sat:      98 % on 2 L/min Temp:     98.1 degrees F oral Pulse rate:   77 / minute BP sitting:   126 / 82  (right arm) Cuff size:   large  Vitals Entered By: Carron Curie CMA (January 28, 2011 9:08 AM)  O2 Flow:  2 L/min CC: Pt here for follow-up sleep. She has new full face mask and it is working well for her.  Pt is wearing mask x 5 hours nightly. Pt also c/o productive cough with green phlegm and nasal congestion x3 days.  Comments Medications reviewed with patient Carron Curie CMA  January 28, 2011 9:10 AM Daytime phone number verified with patient.    Physical Exam  General:  morbidly obese female in nad Nose:  no skin breakdown or pressure necrosis from cpap mask Extremities:  mild edema, no cyanosis  Neurologic:  alert and oriented, moves all 4. does not appear sleepy.   Impression & Recommendations:  Problem # 1:  OBSTRUCTIVE SLEEP APNEA (ICD-327.23)  the pt has found a mask that fits, and is doing much better with cpap.  I have asked her to keep up with mask seal changes and cpap supplies.  I have also encouraged her to work aggressively on weight loss.  She is to followup with me in 6mos.  Medications Added to Medication List This Visit: 1)  Zoloft 100 Mg Tabs (Sertraline hcl) .... Take 1 tablet by mouth once a day 2)  Seroquel Xr 150 Mg Xr24h-tab (Quetiapine fumarate) .... Take 1 tablet by mouth once a day  Other Orders: Est. Patient Level III (04540)  Patient Instructions: 1)   stay on cpap as you are doing.  keep up with changing seals on mask fairly frequently since you need high pressures. 2)  work on weight loss 3)  try tylenol cold and sinus for your head cold, use saline nasal spray to help keep your nose open.  If congestion keeps you from wearing cpap at night, can use afrin 2 sprays each nostril at bedtime ONLY for no more than one week. 4)  followup with me in 6mos.

## 2011-02-24 LAB — DIFFERENTIAL
Basophils Absolute: 0 10*3/uL (ref 0.0–0.1)
Basophils Relative: 1 % (ref 0–1)
Eosinophils Absolute: 0.1 10*3/uL (ref 0.0–0.7)
Eosinophils Relative: 2 % (ref 0–5)
Lymphocytes Relative: 17 % (ref 12–46)
Lymphs Abs: 1 10*3/uL (ref 0.7–4.0)
Monocytes Absolute: 0.3 10*3/uL (ref 0.1–1.0)
Monocytes Relative: 5 % (ref 3–12)
Neutro Abs: 4.3 10*3/uL (ref 1.7–7.7)
Neutrophils Relative %: 76 % (ref 43–77)

## 2011-02-24 LAB — CBC
HCT: 39.5 % (ref 36.0–46.0)
Hemoglobin: 12.7 g/dL (ref 12.0–15.0)
MCH: 30 pg (ref 26.0–34.0)
MCHC: 32.2 g/dL (ref 30.0–36.0)
MCV: 93.2 fL (ref 78.0–100.0)
Platelets: 304 10*3/uL (ref 150–400)
RBC: 4.24 MIL/uL (ref 3.87–5.11)
RDW: 15.1 % (ref 11.5–15.5)
WBC: 5.7 10*3/uL (ref 4.0–10.5)

## 2011-02-24 LAB — BASIC METABOLIC PANEL
BUN: 17 mg/dL (ref 6–23)
CO2: 35 mEq/L — ABNORMAL HIGH (ref 19–32)
Calcium: 9 mg/dL (ref 8.4–10.5)
Chloride: 98 mEq/L (ref 96–112)
Creatinine, Ser: 0.97 mg/dL (ref 0.4–1.2)
GFR calc Af Amer: >60 mL/min (ref >60)
GFR calc non Af Amer: >60 mL/min (ref >60)
Glucose, Bld: 91 mg/dL (ref 70–99)
Potassium: 4 mEq/L (ref 3.5–5.1)
Sodium: 141 mEq/L (ref 135–145)

## 2011-02-24 LAB — RAPID STREP SCREEN (MED CTR MEBANE ONLY): Streptococcus, Group A Screen (Direct): NEGATIVE

## 2011-02-24 LAB — LACTIC ACID, PLASMA: Lactic Acid, Venous: 1 mmol/L (ref 0.5–2.2)

## 2011-02-28 ENCOUNTER — Encounter: Payer: Self-pay | Admitting: Nurse Practitioner

## 2011-02-28 DIAGNOSIS — E039 Hypothyroidism, unspecified: Secondary | ICD-10-CM

## 2011-02-28 DIAGNOSIS — E538 Deficiency of other specified B group vitamins: Secondary | ICD-10-CM | POA: Insufficient documentation

## 2011-02-28 DIAGNOSIS — K5909 Other constipation: Secondary | ICD-10-CM | POA: Insufficient documentation

## 2011-04-06 ENCOUNTER — Telehealth: Payer: Self-pay | Admitting: Pulmonary Disease

## 2011-04-06 NOTE — Telephone Encounter (Signed)
Spoke w/ pt and she states she is going to have laser shoulder surgery due to her arthritis is getting worse and is "eating away at her bones". Pt states surgery is not scheduled yet and it will bed one by Dr. Dion Saucier and she is going to see him next wed. Pt states she uses cpap w/ o2 and wants to know if Dr. Shelle Iron feels like she would be fine to have the surgery. I asked pt did she need clearance and she states no one advised her that she did or not. Please advise Dr. Shelle Iron. Thanks  Carver Fila, CMA

## 2011-04-06 NOTE — Telephone Encounter (Signed)
Ok to have surgery, but needs to have cpap available in the postop period in case she needs while waking up from anesthesia.

## 2011-04-06 NOTE — Telephone Encounter (Signed)
Called, spoke with pt.  She was informed of KC's statement and verbalized understanding of this.

## 2011-04-29 NOTE — Op Note (Signed)
NAME:  Morgan [name], Morgan [name]                        ACCOUNT NO.:  [id number]   MEDICAL RECORD NO.:  [id number]                   PATIENT TYPE:  OIB   LOCATION:  5715                                 FACILITY:  [facility]   PHYSICIAN:  Lorre Munroe., M.D.            DATE OF BIRTH:  1966/05/26   DATE OF PROCEDURE:  03/17/2004  DATE OF DISCHARGE:  03/17/2004                                 OPERATIVE REPORT   PREOPERATIVE DIAGNOSIS:  Symptomatic cholelithiasis.   POSTOPERATIVE DIAGNOSIS:  Symptomatic cholelithiasis.   OPERATION:  Laparoscopic cholecystectomy with operative cholangiogram.   SURGEON:  Lebron Conners, M.D.   ANESTHESIA:  General.   PROCEDURE:  After the patient was monitored and anesthetized, and had  routine preparation and draping of the abdomen, I infused local anesthetic  just below the umbilicus, then subsequently at three additional sites for  port placement.  I made a short transverse incision below the umbilicus and  cut the fascia longitudinally and entered the peritoneum bluntly.  There  were adhesions in the lower abdomen but none right around the umbilicus.  I  put in a Hasson cannula and inflated the abdomen with CO2, securing the  Hasson with a 0 Vicryl pursestring suture in the fascia.  Except for the  adhesions and for some distention of the gallbladder and adhesions of the  liver to the abdominal wall and adhesions of viscera to the gallbladder, I  saw no abnormalities.  After positioning the patient head up, foot down and  tilted to the left, I put in the three additional ports and took down the  adhesions with scissors and with cautery.  I then retracted the fundus of  the gallbladder toward the right shoulder and dissected away the fat from  around the infundibulum until I could pull the infundibulum laterally.  I  noted the cystic duct emerging from the infundibulum and put one clip on it  as it emerged.  I saw the cystic artery branching from a very  prominent  right hepatic artery, and I clipped it with three clips and divided between  the two closest to the gallbladder.  I then made a small hole in the cystic  duct and put in a cholangiogram catheter for fluoroscopic cholangiogram,  which showed a normal biliary anatomy with normal sized ducts and no  evidence of any obstruction or filling defects.  I then removed the  cholangiogram catheter and clipped the distal cystic duct with three clips  and divided it at the site of catheter insertion.  I dissected the  gallbladder from the liver using cautery and mostly the hook instrument.  I  found one other arterial branch, which I clipped and divided.  After  detaching the gallbladder from the liver, I got good hemostasis in the  gallbladder fossa.  I accidentally made a small hole or two in the  gallbladder and spilled a couple of small stones which  I scooped up and  removed.  I placed the gallbladder in a plastic pouch and removed it from  the body through the umbilical incision and tied the first string suture.  After copiously irrigating the right upper quadrant until the irrigant was  clear and removing the irrigant, I concluded the operation, finding that the  clips were secure and that hemostasis was good.  I removed the lateral ports  under direct vision, then allowed the CO2 to escape and removed the  epigastric port.  I closed all skin incisions with intracuticular 4-0 Vicryl  and Steri-Strips.  The patient tolerated the operation well.                                               Lorre Munroe., M.D.    Jodi Marble  D:  03/17/2004  T:  03/18/2004  Job:  [id number]

## 2011-04-29 NOTE — Procedures (Signed)
Morgan [name], YERBY NO.:  [id number]   MEDICAL RECORD NO.:  [id number]          PATIENT TYPE:  OUT   LOCATION:  SLEEP LAB                     FACILITY:  [facility]   PHYSICIAN:  Marcelyn Bruins, M.D. [facility] DATE OF BIRTH:  [birth year]   DATE OF STUDY:  07/25/2005                              NOCTURNAL POLYSOMNOGRAM   REFERRING PHYSICIAN:  Ernestina Penna, M.D.   DATE OF STUDY:  July 25, 2005.   INDICATION FOR STUDY:  Hypersomnia with sleep apnea.   EPWORTH SLEEPINESS SCORE:  8.   SLEEP ARCHITECTURE:  The patient had total sleep time of 242 minutes with  adequate slow wave sleep but never achieved REM.  Sleep onset latency was  prolonged at 39 minutes. Sleep efficiency was very poor at 60%   IMPRESSION:  1.  Mild obstructive sleep apnea/hypopnea syndrome with a respiratory      disturbance index of 9 events per hour and O2 desaturation as low as      86%. Events were not positional. The patient did not meet split night      criteria secondary to the small numbers of events in the allotted time.      Treatment for this degree of sleep apnea may include weight loss alone,      upper airway surgery, oral appliances, or CPAP. Clinical correlation is      suggested.  2.  Mild to moderate snoring noted throughout.  3.  No clinically significant cardiac arrhythmias.                                            ______________________________  Marcelyn Bruins, M.D. [facility]  Diplomate, American Board of Sleep  Medicine     KC/MEDQ  D:  07/27/2005 11:57:16  T:  07/27/2005 12:42:27  Job:  [id number]

## 2011-06-09 ENCOUNTER — Encounter: Payer: Self-pay | Admitting: Internal Medicine

## 2011-06-10 ENCOUNTER — Telehealth: Payer: Self-pay | Admitting: Internal Medicine

## 2011-06-10 ENCOUNTER — Encounter: Payer: Self-pay | Admitting: Internal Medicine

## 2011-06-10 ENCOUNTER — Ambulatory Visit (INDEPENDENT_AMBULATORY_CARE_PROVIDER_SITE_OTHER): Payer: Medicare Other | Admitting: Internal Medicine

## 2011-06-10 VITALS — BP 112/63 | HR 83 | Ht 66.0 in | Wt 312.0 lb

## 2011-06-10 DIAGNOSIS — G4733 Obstructive sleep apnea (adult) (pediatric): Secondary | ICD-10-CM

## 2011-06-10 DIAGNOSIS — Z01818 Encounter for other preprocedural examination: Secondary | ICD-10-CM

## 2011-06-10 NOTE — Telephone Encounter (Signed)
LOV,Sleep Study faxed to [facility] @[phone]  06/10/11/km

## 2011-06-12 DIAGNOSIS — Z419 Encounter for procedure for purposes other than remedying health state, unspecified: Secondary | ICD-10-CM | POA: Insufficient documentation

## 2011-06-12 HISTORY — PX: SHOULDER ARTHROSCOPY: SHX128

## 2011-06-12 NOTE — Progress Notes (Signed)
HPI  Patient is a 45 year old who was referred for preop risk stratification prior to shoulder surgery The patient has no history of CAD.  She does have a history of sleep apnea She was recently seen at [facility].  EKG showed nonspeciific IVCD. She denies chest pains. She denies dizziness.  No syncope. Able to clean house. Allergies  Allergen Reactions  . Codeine     REACTION: increased BP  . Ketoconazole     REACTION: hives, swelling  . Statins     REACTION: leg crapms    Current Outpatient Prescriptions  Medication Sig Dispense Refill  . calcium-vitamin D (OSCAL WITH D) 500-200 MG-UNIT per tablet Take 1 tablet by mouth daily.        Marland Kitchen etodolac (LODINE) 500 MG tablet Take 500 mg by mouth 2 (two) times daily.        . fenofibrate micronized (ANTARA) 130 MG capsule Take 130 mg by mouth daily before breakfast.        . ferrous gluconate (FERGON) 325 MG tablet Take 325 mg by mouth daily with breakfast.        . furosemide (LASIX) 20 MG tablet Take 20 mg by mouth daily.        Marland Kitchen levothyroxine (SYNTHROID, LEVOTHROID) 150 MCG tablet Take 150 mcg by mouth daily.        Marland Kitchen loratadine (CLARITIN) 10 MG tablet Take 10 mg by mouth daily. Prn        . lubiprostone (AMITIZA) 24 MCG capsule Take 24 mcg by mouth 2 (two) times daily with a meal.        . MEDROXYPROGESTERONE ACETATE PO Take by mouth. Take 1 tab daily for 12 days per month every other month       . Multiple Vitamin (MULTIVITAMIN) capsule Take 1 capsule by mouth daily.        Marland Kitchen nystatin (MYCOSTATIN) powder Apply topically 2 (two) times daily as needed.        Marland Kitchen omeprazole (PRILOSEC) 40 MG capsule Take 40 mg by mouth daily.        . potassium chloride (K-DUR,KLOR-CON) 10 MEQ tablet Take 10 mEq by mouth daily.        . pseudoephedrine (SUDAFED) 30 MG tablet Take 30 mg by mouth every 4 (four) hours as needed.        Marland Kitchen QUEtiapine Fumarate (SEROQUEL XR) 150 MG TB24 Take by mouth. 1 po q day       . sertraline (ZOLOFT) 100 MG tablet Take 1 and  1/2 tab daily      . traMADol (ULTRAM) 50 MG tablet Take 50 mg by mouth. 1 to 2 tabs po q 4-6 hours prn       . valsartan-hydrochlorothiazide (DIOVAN-HCT) 160-25 MG per tablet Take 1 tablet by mouth daily.          Past Medical History  Diagnosis Date  . Unspecified hypothyroidism   . Constipation, chronic   . Vitamin B 12 deficiency     No past surgical history on file.  No family history on file.  History   Social History  . Marital Status: Single    Spouse Name: N/A    Number of Children: N/A  . Years of Education: N/A   Occupational History  . Not on file.   Social History Main Topics  . Smoking status: Never Smoker   . Smokeless tobacco: Not on file  . Alcohol Use: Not on file  . Drug Use: Not  on file  . Sexually Active: Not on file   Other Topics Concern  . Not on file   Social History Narrative  . No narrative on file    Review of Systems:  All systems reviewed.  They are negative to the above problem except as previously stated.  Vital Signs: BP 112/63  Pulse 83  Ht 5\' 6"  (1.676 m)  Wt 312 lb (141.522 kg)  BMI 50.36 kg/m2  Physical Exam  Patient is in NAD  HEENT:  Normocephalic, atraumatic. EOMI, PERRLA.  Neck: JVP is normal. No thyromegaly. No bruits.  Lungs: clear to auscultation. No rales no wheezes.  Heart: Regular rate and rhythm. Normal S1, S2. No S3.   No significant murmurs. PMI not displaced.  Abdomen:  Supple, nontender. Normal bowel sounds. No masses. No hepatomegaly.  Extremities:   Good distal pulses throughout. No lower extremity edema.  Musculoskeletal :moving all extremities.  Neuro:   alert and oriented x3.  CN II-XII grossly intact.  EKG:  71 bpm.  Nonspecific IVCD.   Assessment and Plan:

## 2011-06-12 NOTE — Assessment & Plan Note (Signed)
Patient does not appear to have any active cardiac problems.  Overall I think she is a low risk for cardiac event with planned procedure and OK to proceed.

## 2011-06-12 NOTE — Assessment & Plan Note (Signed)
Continue CPAP.  

## 2011-06-13 ENCOUNTER — Telehealth: Payer: Self-pay | Admitting: Internal Medicine

## 2011-06-13 NOTE — Telephone Encounter (Signed)
Office note sent in EPIC system.

## 2011-06-13 NOTE — Telephone Encounter (Signed)
DR OFFICE NEEDS SUR CLEARANCE. DR FAX# [phone]. PT WAS SEEN BY DR ROSS.

## 2011-06-27 ENCOUNTER — Telehealth: Payer: Self-pay | Admitting: Internal Medicine

## 2011-06-27 NOTE — Telephone Encounter (Signed)
Faxed EKG to [facility] @ [facility] ([phone]).

## 2011-06-28 ENCOUNTER — Other Ambulatory Visit: Payer: Self-pay | Admitting: Orthopedic Surgery

## 2011-06-28 ENCOUNTER — Ambulatory Visit
Admission: RE | Admit: 2011-06-28 | Discharge: 2011-06-28 | Disposition: A | Discharge status: Home / Self Care | Payer: Medicare Other | Source: Ambulatory Visit | Attending: Orthopedic Surgery | Admitting: Orthopedic Surgery

## 2011-06-28 ENCOUNTER — Encounter (HOSPITAL_BASED_OUTPATIENT_CLINIC_OR_DEPARTMENT_OTHER)
Admission: RE | Admit: 2011-06-28 | Discharge: 2011-06-28 | Disposition: A | Discharge status: Home / Self Care | Payer: Medicare Other | Source: Ambulatory Visit | Attending: Orthopedic Surgery | Admitting: Orthopedic Surgery

## 2011-06-28 DIAGNOSIS — Z01811 Encounter for preprocedural respiratory examination: Secondary | ICD-10-CM

## 2011-06-28 LAB — BASIC METABOLIC PANEL
BUN: 14 mg/dL (ref 6–23)
Chloride: 98 mEq/L (ref 96–112)
Creatinine, Ser: 0.69 mg/dL (ref 0.50–1.10)
Glucose, Bld: 121 mg/dL — ABNORMAL HIGH (ref 70–99)
Potassium: 3.5 mEq/L (ref 3.5–5.1)

## 2011-07-01 ENCOUNTER — Ambulatory Visit (HOSPITAL_BASED_OUTPATIENT_CLINIC_OR_DEPARTMENT_OTHER)
Admission: RE | Admit: 2011-07-01 | Discharge: 2011-07-02 | Disposition: A | Discharge status: Home / Self Care | Payer: Medicare Other | Source: Ambulatory Visit | Attending: Orthopedic Surgery | Admitting: Orthopedic Surgery

## 2011-07-01 DIAGNOSIS — M24119 Other articular cartilage disorders, unspecified shoulder: Secondary | ICD-10-CM | POA: Insufficient documentation

## 2011-07-01 DIAGNOSIS — Z01812 Encounter for preprocedural laboratory examination: Secondary | ICD-10-CM | POA: Insufficient documentation

## 2011-07-01 DIAGNOSIS — M19019 Primary osteoarthritis, unspecified shoulder: Secondary | ICD-10-CM | POA: Insufficient documentation

## 2011-07-01 DIAGNOSIS — Z79899 Other long term (current) drug therapy: Secondary | ICD-10-CM | POA: Insufficient documentation

## 2011-07-01 DIAGNOSIS — Z01818 Encounter for other preprocedural examination: Secondary | ICD-10-CM | POA: Insufficient documentation

## 2011-07-01 DIAGNOSIS — F411 Generalized anxiety disorder: Secondary | ICD-10-CM | POA: Insufficient documentation

## 2011-07-01 DIAGNOSIS — M25819 Other specified joint disorders, unspecified shoulder: Secondary | ICD-10-CM | POA: Insufficient documentation

## 2011-07-01 DIAGNOSIS — F3289 Other specified depressive episodes: Secondary | ICD-10-CM | POA: Insufficient documentation

## 2011-07-01 DIAGNOSIS — I10 Essential (primary) hypertension: Secondary | ICD-10-CM | POA: Insufficient documentation

## 2011-07-01 DIAGNOSIS — F329 Major depressive disorder, single episode, unspecified: Secondary | ICD-10-CM | POA: Insufficient documentation

## 2011-07-01 LAB — POCT HEMOGLOBIN-HEMACUE: Hemoglobin: 13.2 g/dL (ref 12.0–15.0)

## 2011-07-07 NOTE — Op Note (Signed)
  NAMENEDA, WILLENBRING NO.:  [id number]  MEDICAL RECORD NO.:  [id number]  LOCATION:                                 FACILITY:  PHYSICIAN:  Eulas Post, MD         DATE OF BIRTH:  DATE OF PROCEDURE:  07/01/2011 DATE OF DISCHARGE:                              OPERATIVE REPORT   ATTENDING SURGEON:  Eulas Post, MD  ASSISTANT:  Janace Litten, Orthopedics PA-C  PREOPERATIVE DIAGNOSIS:  Left shoulder impingement syndrome and acromioclavicular joint arthritis.  POSTOPERATIVE DIAGNOSIS:  Left shoulder impingement syndrome, acromioclavicular joint arthritis, and labral tearing.  OPERATIVE PROCEDURE:  Left shoulder arthroscopy with acromioplasty and distal clavicle resection and debridement of labrum.  PREOPERATIVE INDICATIONS:  [name] is a 45 year old woman who had persistent left shoulder pain that failed conservative treatment including antiinflammatories, injections, as well as exercises.  She localized pain vertically over the [facility] joint, as well as laterally with overhead motion.  She elected for the above-named procedures.  The risks, benefits, and alternatives were discussed with her before the procedure including, but not limited to risks of infection, bleeding, nerve injury, stiffness, incomplete relief of pain, cardiopulmonary complications, among others and she is willing to proceed.  OPERATIVE FINDINGS:  The glenohumeral joint actually had a remarkable amount of degenerative changes.  The anterior labrum had substantial fraying and tearing diffusely all the way around to the 6 o'clock position.  This was not an unstable labrum, but were frayed and degenerative.  The rotator cuff and biceps and biceps pulley were all intact.  She had full motion during exam under anesthesia with no evidence for adhesive capsulitis.  Her subacromial space had some moderate bursitis with mild undersurface spurring of the acromion.  The [facility] joint had  substantial degenerative changes with osteolysis changes noted.  OPERATIVE PROCEDURE:  The patient was brought to the operating room, placed in supine position.  General anesthesia was administered.  2 g of Ancef were given.  The left upper extremity was prepped and draped in usual sterile fashion.  Examination under anesthesia demonstrated full motion.  A time-out was performed and diagnostic arthroscopy was carried out with the above-named findings.  The arthroscopic shaver was used to debride the anterior labrum.  A cannula was also utilized.  Once I had completed the debridement and the diagnostic arthroscopy of the joint, I went to the subacromial space.  Complete bursectomy was performed, and I released the CA ligament and performed the undersurface acromioplasty using a bur.  I then resected the distal clavicle and confirmed adequate resection both from the lateral and the anterior viewing portals.  The shoulder was then irrigated copiously and the arthroscopic instruments removed and the portal was closed with Monocryl and Steri-Strips and sterile gauze.  She was awakened and extubated and returned to PACU in stable satisfactory condition.  There were no complications.  She tolerated procedure well.     Eulas Post, MD     JPL/MEDQ  D:  07/01/2011  T:  07/01/2011  Job:  [id number]  Electronically Signed by Teryl Lucy MD on 07/07/2011 10:35:29 AM

## 2011-07-18 ENCOUNTER — Ambulatory Visit: Payer: Medicare Other | Attending: Orthopedic Surgery | Admitting: Physical Therapy

## 2011-07-18 DIAGNOSIS — IMO0001 Reserved for inherently not codable concepts without codable children: Secondary | ICD-10-CM | POA: Insufficient documentation

## 2011-07-18 DIAGNOSIS — R5381 Other malaise: Secondary | ICD-10-CM | POA: Insufficient documentation

## 2011-07-18 DIAGNOSIS — M25519 Pain in unspecified shoulder: Secondary | ICD-10-CM | POA: Insufficient documentation

## 2011-07-18 DIAGNOSIS — M25619 Stiffness of unspecified shoulder, not elsewhere classified: Secondary | ICD-10-CM | POA: Insufficient documentation

## 2011-07-22 ENCOUNTER — Ambulatory Visit: Payer: Medicare Other | Admitting: Physical Therapy

## 2011-07-25 ENCOUNTER — Ambulatory Visit: Payer: Medicare Other | Admitting: Physical Therapy

## 2011-07-28 ENCOUNTER — Encounter: Payer: Self-pay | Admitting: Pulmonary Disease

## 2011-07-28 ENCOUNTER — Ambulatory Visit: Payer: Medicare Other | Admitting: Physical Therapy

## 2011-07-29 ENCOUNTER — Ambulatory Visit (INDEPENDENT_AMBULATORY_CARE_PROVIDER_SITE_OTHER): Payer: Medicare Other | Admitting: Pulmonary Disease

## 2011-07-29 ENCOUNTER — Encounter: Payer: Self-pay | Admitting: Pulmonary Disease

## 2011-07-29 VITALS — BP 126/72 | HR 72 | Temp 97.5°F | Ht 66.0 in | Wt 317.0 lb

## 2011-07-29 DIAGNOSIS — G4733 Obstructive sleep apnea (adult) (pediatric): Secondary | ICD-10-CM

## 2011-07-29 NOTE — Patient Instructions (Signed)
Continue on CPAP, and keep up with cushion on your mask. Work on weight loss Followup with me in one year or sooner if having problems.

## 2011-07-29 NOTE — Progress Notes (Signed)
  Subjective:    Patient ID: Morgan [name], female    DOB: 06/10/1966, 45 y.o.   MRN: [id number]  HPI The patient comes in today for followup of her known severe obstructive sleep apnea.  She is wearing CPAP compliantly, and feels that she is sleeping fairly well with adequate daytime alertness.  She denies any issues with mask fit, leaks, or an intolerance to her current pressure.  She is keeping up with her CPAP supplies.   Review of Systems  Constitutional: Negative for fever and unexpected weight change.  HENT: Positive for congestion. Negative for ear pain, nosebleeds, sore throat, rhinorrhea, sneezing, trouble swallowing, dental problem, postnasal drip and sinus pressure.   Eyes: Negative for redness and itching.  Respiratory: Negative for cough, chest tightness, shortness of breath and wheezing.   Cardiovascular: Negative for palpitations and leg swelling.  Gastrointestinal: Negative for nausea and vomiting.  Genitourinary: Negative for dysuria.  Musculoskeletal: Positive for joint swelling.  Skin: Negative for rash.  Neurological: Positive for headaches.  Hematological: Does not bruise/bleed easily.  Psychiatric/Behavioral: Negative for dysphoric mood. The patient is not nervous/anxious.        Objective:   Physical Exam Morbidly obese female in no acute distress No skin breakdown or pressure necrosis from the CPAP mask Lower extremities with 1+ edema bilaterally, no cyanosis noted Alert, does not appear sleepy, moves all 4 extremities.       Assessment & Plan:

## 2011-07-29 NOTE — Assessment & Plan Note (Signed)
The patient currently is doing well with CPAP, and has been compliant by her history.  She feels that she is sleeping well with adequate daytime alertness.  I've encouraged her to work aggressively on weight loss, and to keep up with CPAP supplies and mask changes.  She will followup with me in one year.

## 2011-08-02 ENCOUNTER — Ambulatory Visit: Payer: Medicare Other | Admitting: Physical Therapy

## 2011-08-04 ENCOUNTER — Ambulatory Visit: Payer: Medicare Other | Admitting: Physical Therapy

## 2011-08-08 ENCOUNTER — Ambulatory Visit: Payer: Medicare Other | Admitting: Physical Therapy

## 2011-10-19 ENCOUNTER — Other Ambulatory Visit: Payer: Self-pay | Admitting: Orthopedic Surgery

## 2011-10-19 ENCOUNTER — Other Ambulatory Visit: Payer: Medicare Other

## 2011-10-19 DIAGNOSIS — M25562 Pain in left knee: Secondary | ICD-10-CM

## 2011-10-20 ENCOUNTER — Ambulatory Visit
Admission: RE | Admit: 2011-10-20 | Discharge: 2011-10-20 | Disposition: A | Discharge status: Home / Self Care | Payer: Medicare Other | Source: Ambulatory Visit | Attending: Orthopedic Surgery | Admitting: Orthopedic Surgery

## 2011-10-20 DIAGNOSIS — M25562 Pain in left knee: Secondary | ICD-10-CM

## 2012-07-30 ENCOUNTER — Ambulatory Visit (INDEPENDENT_AMBULATORY_CARE_PROVIDER_SITE_OTHER): Payer: Medicare Other | Admitting: Pulmonary Disease

## 2012-07-30 ENCOUNTER — Encounter: Payer: Self-pay | Admitting: Pulmonary Disease

## 2012-07-30 VITALS — BP 122/72 | HR 99 | Temp 98.1°F | Ht 66.0 in | Wt 322.6 lb

## 2012-07-30 DIAGNOSIS — G4733 Obstructive sleep apnea (adult) (pediatric): Secondary | ICD-10-CM

## 2012-07-30 NOTE — Progress Notes (Signed)
  Subjective:    Patient ID: Morgan [name], female    DOB: 03/21/1966, 46 y.o.   MRN: [id number]  HPI The pt comes in today for f/u of her known obstructive sleep apnea.  She has been wearing cpap compliantly, and has had no issues with pressure tolerance or mask comfort.  She is due for a new cushion, and has had some leaking recently.  She feels that her daytime alertness is stable, but has been having increased awakenings of late.  Her weight is up a few pounds from her last visit.    Review of Systems  Constitutional: Negative for fever and unexpected weight change.  HENT: Positive for congestion. Negative for ear pain, nosebleeds, sore throat, rhinorrhea, sneezing, trouble swallowing, dental problem, postnasal drip and sinus pressure.   Eyes: Negative for redness and itching.  Respiratory: Negative for cough, chest tightness, shortness of breath and wheezing.   Cardiovascular: Negative for palpitations and leg swelling.  Gastrointestinal: Negative for nausea and vomiting.  Genitourinary: Negative for dysuria.  Musculoskeletal: Positive for arthralgias. Negative for joint swelling.  Skin: Negative for rash.  Neurological: Positive for headaches.  Hematological: Does not bruise/bleed easily.  Psychiatric/Behavioral: Negative for dysphoric mood. The patient is nervous/anxious.   All other systems reviewed and are negative.       Objective:   Physical Exam Obese female in nad Nose without purulence or discharge. No skin breakdown or pressure necrosis from cpap mask. LE with edema, no cyanosis  Alert and oriented, does not appear sleepy, moves all 4.        Assessment & Plan:

## 2012-07-30 NOTE — Patient Instructions (Addendum)
Continue with cpap, and work on weight loss Keep up with mask changes and supplies If you continue to have frequent awakenings at night for unknown reasons, let me know and we can recheck your optimal pressure at home. followup with me in one year.

## 2012-07-30 NOTE — Assessment & Plan Note (Signed)
The patient is doing fairly well overall with her CPAP therapy, and feels that it is helping her daytime alertness.  She is having some increased awakenings at night, and it is unclear whether this has anything to do with her sleep disordered breathing.  She continues to have adequate daytime alertness.  Her weight has increased, and therefore if she continues to have these awakenings, would consider optimizing her pressure again on the automatic setting.  She is to call me if this continues, and I have encouraged her to work aggressively on weight loss.

## 2012-12-14 ENCOUNTER — Other Ambulatory Visit: Payer: Self-pay | Admitting: Orthopedic Surgery

## 2012-12-18 ENCOUNTER — Encounter (HOSPITAL_BASED_OUTPATIENT_CLINIC_OR_DEPARTMENT_OTHER): Payer: Self-pay | Admitting: *Deleted

## 2012-12-18 NOTE — Progress Notes (Signed)
Pt was here 2012 for shoulder surgery-did stay overnight due to OSA-uses cpap-did well To bring cpap dos and knows to use it post op

## 2012-12-19 ENCOUNTER — Other Ambulatory Visit: Payer: Self-pay

## 2012-12-19 ENCOUNTER — Encounter (HOSPITAL_BASED_OUTPATIENT_CLINIC_OR_DEPARTMENT_OTHER)
Admission: RE | Admit: 2012-12-19 | Discharge: 2012-12-19 | Disposition: A | Discharge status: Home / Self Care | Payer: Medicare Other | Source: Ambulatory Visit | Attending: Orthopedic Surgery | Admitting: Orthopedic Surgery

## 2012-12-19 LAB — BASIC METABOLIC PANEL
Chloride: 100 mEq/L (ref 96–112)
GFR calc Af Amer: >90 mL/min (ref >90)
GFR calc non Af Amer: >90 mL/min (ref >90)
Potassium: 3.6 mEq/L (ref 3.5–5.1)
Sodium: 140 mEq/L (ref 135–145)

## 2012-12-20 ENCOUNTER — Ambulatory Visit (HOSPITAL_BASED_OUTPATIENT_CLINIC_OR_DEPARTMENT_OTHER)
Admission: RE | Admit: 2012-12-20 | Discharge: 2012-12-20 | Disposition: A | Discharge status: Home / Self Care | Payer: Medicare Other | Source: Ambulatory Visit | Attending: Orthopedic Surgery | Admitting: Orthopedic Surgery

## 2012-12-20 ENCOUNTER — Encounter (HOSPITAL_BASED_OUTPATIENT_CLINIC_OR_DEPARTMENT_OTHER): Payer: Self-pay | Admitting: Anesthesiology

## 2012-12-20 ENCOUNTER — Encounter (HOSPITAL_BASED_OUTPATIENT_CLINIC_OR_DEPARTMENT_OTHER): Payer: Self-pay

## 2012-12-20 ENCOUNTER — Ambulatory Visit (HOSPITAL_BASED_OUTPATIENT_CLINIC_OR_DEPARTMENT_OTHER): Payer: Medicare Other | Admitting: Anesthesiology

## 2012-12-20 ENCOUNTER — Encounter (HOSPITAL_BASED_OUTPATIENT_CLINIC_OR_DEPARTMENT_OTHER)
Admission: RE | Disposition: A | Discharge status: Home / Self Care | Payer: Self-pay | Source: Ambulatory Visit | Attending: Orthopedic Surgery

## 2012-12-20 ENCOUNTER — Encounter (HOSPITAL_BASED_OUTPATIENT_CLINIC_OR_DEPARTMENT_OTHER): Payer: Self-pay | Admitting: Orthopedic Surgery

## 2012-12-20 DIAGNOSIS — Z01812 Encounter for preprocedural laboratory examination: Secondary | ICD-10-CM | POA: Insufficient documentation

## 2012-12-20 DIAGNOSIS — M6530 Trigger finger, unspecified finger: Secondary | ICD-10-CM | POA: Insufficient documentation

## 2012-12-20 DIAGNOSIS — K219 Gastro-esophageal reflux disease without esophagitis: Secondary | ICD-10-CM | POA: Insufficient documentation

## 2012-12-20 DIAGNOSIS — E039 Hypothyroidism, unspecified: Secondary | ICD-10-CM | POA: Insufficient documentation

## 2012-12-20 DIAGNOSIS — E669 Obesity, unspecified: Secondary | ICD-10-CM | POA: Insufficient documentation

## 2012-12-20 DIAGNOSIS — E119 Type 2 diabetes mellitus without complications: Secondary | ICD-10-CM | POA: Insufficient documentation

## 2012-12-20 DIAGNOSIS — I10 Essential (primary) hypertension: Secondary | ICD-10-CM | POA: Insufficient documentation

## 2012-12-20 DIAGNOSIS — Z79899 Other long term (current) drug therapy: Secondary | ICD-10-CM | POA: Insufficient documentation

## 2012-12-20 DIAGNOSIS — G4733 Obstructive sleep apnea (adult) (pediatric): Secondary | ICD-10-CM | POA: Insufficient documentation

## 2012-12-20 HISTORY — DX: Gastro-esophageal reflux disease without esophagitis: K21.9

## 2012-12-20 HISTORY — PX: TRIGGER FINGER RELEASE: SHX641

## 2012-12-20 HISTORY — DX: Type 2 diabetes mellitus without complications: E11.9

## 2012-12-20 LAB — POCT HEMOGLOBIN-HEMACUE: Hemoglobin: 12.9 g/dL (ref 12.0–15.0)

## 2012-12-20 SURGERY — RELEASE, A1 PULLEY, FOR TRIGGER FINGER
Anesthesia: Monitor Anesthesia Care | Site: Thumb | Laterality: Left | Wound class: Clean

## 2012-12-20 MED ORDER — BUPIVACAINE HCL (PF) 0.25 % IJ SOLN
INTRAMUSCULAR | Status: DC | PRN
  Administered 2012-12-20: 10 mL

## 2012-12-20 MED ORDER — MIDAZOLAM HCL 2 MG/2ML IJ SOLN: 1.0000 mg | INTRAMUSCULAR | Status: DC | PRN

## 2012-12-20 MED ORDER — OXYCODONE-ACETAMINOPHEN 5-325 MG PO TABS: ORAL_TABLET | ORAL | Status: DC

## 2012-12-20 MED ORDER — MIDAZOLAM HCL 5 MG/5ML IJ SOLN
INTRAMUSCULAR | Status: DC | PRN
  Administered 2012-12-20: 2 mg via INTRAVENOUS

## 2012-12-20 MED ORDER — DEXAMETHASONE SODIUM PHOSPHATE 10 MG/ML IJ SOLN
INTRAMUSCULAR | Status: DC | PRN
  Administered 2012-12-20: 10 mg via INTRAVENOUS

## 2012-12-20 MED ORDER — ONDANSETRON HCL 4 MG/2ML IJ SOLN
INTRAMUSCULAR | Status: DC | PRN
  Administered 2012-12-20: 4 mg via INTRAVENOUS

## 2012-12-20 MED ORDER — CHLORHEXIDINE GLUCONATE 4 % EX LIQD: 60.0000 mL | Freq: Once | CUTANEOUS | Status: DC

## 2012-12-20 MED ORDER — FENTANYL CITRATE 0.05 MG/ML IJ SOLN
INTRAMUSCULAR | Status: DC | PRN
  Administered 2012-12-20: 100 ug via INTRAVENOUS
  Administered 2012-12-20: 50 ug via INTRAVENOUS

## 2012-12-20 MED ORDER — OXYCODONE HCL 5 MG/5ML PO SOLN: 5.0000 mg | Freq: Once | ORAL | Status: DC | PRN

## 2012-12-20 MED ORDER — LIDOCAINE HCL (CARDIAC) 20 MG/ML IV SOLN
INTRAVENOUS | Status: DC | PRN
  Administered 2012-12-20: 50 mg via INTRAVENOUS

## 2012-12-20 MED ORDER — LACTATED RINGERS IV SOLN
INTRAVENOUS | Status: DC
  Administered 2012-12-20: 08:00:00 via INTRAVENOUS

## 2012-12-20 MED ORDER — FENTANYL CITRATE 0.05 MG/ML IJ SOLN: 50.0000 ug | Freq: Once | INTRAMUSCULAR | Status: DC

## 2012-12-20 MED ORDER — FENTANYL CITRATE 0.05 MG/ML IJ SOLN: 25.0000 ug | INTRAMUSCULAR | Status: DC | PRN

## 2012-12-20 MED ORDER — PROPOFOL INFUSION 10 MG/ML OPTIME
INTRAVENOUS | Status: DC | PRN
  Administered 2012-12-20: 100 ug/kg/min via INTRAVENOUS

## 2012-12-20 MED ORDER — CEFAZOLIN SODIUM-DEXTROSE 2-3 GM-% IV SOLR: 2.0000 g | Freq: Once | INTRAVENOUS | Status: DC

## 2012-12-20 MED ORDER — OXYCODONE HCL 5 MG PO TABS: 5.0000 mg | ORAL_TABLET | Freq: Once | ORAL | Status: DC | PRN

## 2012-12-20 MED ORDER — PROMETHAZINE HCL 25 MG/ML IJ SOLN: 6.2500 mg | INTRAMUSCULAR | Status: DC | PRN

## 2012-12-20 SURGICAL SUPPLY — 35 items
BANDAGE COBAN STERILE 2 (GAUZE/BANDAGES/DRESSINGS) ×2 IMPLANT
BANDAGE CONFORM 2  STR LF (GAUZE/BANDAGES/DRESSINGS) ×2 IMPLANT
BLADE MINI RND TIP GREEN BEAV (BLADE) IMPLANT
BLADE SURG 15 STRL LF DISP TIS (BLADE) ×2 IMPLANT
BLADE SURG 15 STRL SS (BLADE) ×4
BNDG CMPR 9X4 STRL LF SNTH (GAUZE/BANDAGES/DRESSINGS) ×1
BNDG CMPR MD 5X2 ELC HKLP STRL (GAUZE/BANDAGES/DRESSINGS)
BNDG ELASTIC 2 VLCR STRL LF (GAUZE/BANDAGES/DRESSINGS) IMPLANT
BNDG ESMARK 4X9 LF (GAUZE/BANDAGES/DRESSINGS) ×1 IMPLANT
CHLORAPREP W/TINT 26ML (MISCELLANEOUS) ×2 IMPLANT
CLOTH BEACON ORANGE TIMEOUT ST (SAFETY) ×2 IMPLANT
CORDS BIPOLAR (ELECTRODE) ×2 IMPLANT
COVER MAYO STAND STRL (DRAPES) ×2 IMPLANT
COVER TABLE BACK 60X90 (DRAPES) ×2 IMPLANT
CUFF TOURNIQUET SINGLE 18IN (TOURNIQUET CUFF) ×2 IMPLANT
DRAPE EXTREMITY T 121X128X90 (DRAPE) ×2 IMPLANT
DRAPE SURG 17X23 STRL (DRAPES) ×2 IMPLANT
GAUZE XEROFORM 1X8 LF (GAUZE/BANDAGES/DRESSINGS) ×2 IMPLANT
GLOVE BIO SURGEON STRL SZ7.5 (GLOVE) ×2 IMPLANT
GOWN PREVENTION PLUS XLARGE (GOWN DISPOSABLE) ×2 IMPLANT
GOWN STRL REIN XL XLG (GOWN DISPOSABLE) ×2 IMPLANT
NDL HYPO 25X1 1.5 SAFETY (NEEDLE) IMPLANT
NEEDLE HYPO 25X1 1.5 SAFETY (NEEDLE) ×2 IMPLANT
NS IRRIG 1000ML POUR BTL (IV SOLUTION) ×2 IMPLANT
PACK BASIN DAY SURGERY FS (CUSTOM PROCEDURE TRAY) ×2 IMPLANT
PADDING CAST ABS 4INX4YD NS (CAST SUPPLIES) ×1
PADDING CAST ABS COTTON 4X4 ST (CAST SUPPLIES) ×1 IMPLANT
SPONGE GAUZE 4X4 12PLY (GAUZE/BANDAGES/DRESSINGS) ×2 IMPLANT
STOCKINETTE 4X48 STRL (DRAPES) ×2 IMPLANT
SUT ETHILON 4 0 PS 2 18 (SUTURE) ×2 IMPLANT
SYR BULB 3OZ (MISCELLANEOUS) ×2 IMPLANT
SYR CONTROL 10ML LL (SYRINGE) ×1 IMPLANT
TOWEL OR 17X24 6PK STRL BLUE (TOWEL DISPOSABLE) ×4 IMPLANT
UNDERPAD 30X30 INCONTINENT (UNDERPADS AND DIAPERS) ×2 IMPLANT
WATER STERILE IRR 1000ML POUR (IV SOLUTION) ×1 IMPLANT

## 2012-12-20 NOTE — Anesthesia Postprocedure Evaluation (Signed)
  Anesthesia Post-op Note  Patient: Morgan [name]  Procedure(s) Performed: Procedure(s) (LRB) with comments: RELEASE TRIGGER FINGER/A-1 PULLEY (Left) - LEFT TRIGGER THUMB RELEASE  Patient Location: PACU  Anesthesia Type:MAC  Level of Consciousness: awake and alert   Airway and Oxygen Therapy: Patient Spontanous Breathing  Post-op Pain: mild  Post-op Assessment: Post-op Vital signs reviewed, Patient's Cardiovascular Status Stable, Respiratory Function Stable, Patent Airway, No signs of Nausea or vomiting, Adequate PO intake and Pain level controlled  Post-op Vital Signs: stable  Complications: No apparent anesthesia complications

## 2012-12-20 NOTE — Anesthesia Procedure Notes (Signed)
Procedure Name: MAC Date/Time: 12/20/2012 8:52 AM Performed by: Caren Macadam Pre-anesthesia Checklist: Patient identified, Emergency Drugs available, Suction available and Patient being monitored Patient Re-evaluated:Patient Re-evaluated prior to inductionOxygen Delivery Method: Simple face mask Preoxygenation: Pre-oxygenation with 100% oxygen Intubation Type: IV induction Placement Confirmation: breath sounds checked- equal and bilateral and positive ETCO2

## 2012-12-20 NOTE — Transfer of Care (Signed)
Immediate Anesthesia Transfer of Care Note  Patient: Morgan [name]  Procedure(s) Performed: Procedure(s) (LRB) with comments: RELEASE TRIGGER FINGER/A-1 PULLEY (Left) - LEFT TRIGGER THUMB RELEASE  Patient Location: PACU  Anesthesia Type:MAC  Level of Consciousness: awake and alert   Airway & Oxygen Therapy: Patient Spontanous Breathing and Patient connected to face mask oxygen  Post-op Assessment: Report given to PACU RN and Post -op Vital signs reviewed and stable  Post vital signs: Reviewed and stable  Complications: No apparent anesthesia complications

## 2012-12-20 NOTE — Brief Op Note (Signed)
12/20/2012  9:21 AM  PATIENT:  Morgan [name]  47 y.o. female  PRE-OPERATIVE DIAGNOSIS:  LEFT TRIGGER THUMB  POST-OPERATIVE DIAGNOSIS:  LEFT TRIGGER THUMB  PROCEDURE:  Procedure(s) (LRB) with comments: RELEASE TRIGGER FINGER/A-1 PULLEY (Left) - LEFT TRIGGER THUMB RELEASE  SURGEON:  Surgeon(s) and Role:    * Tami Ribas, MD - Primary  PHYSICIAN ASSISTANT:   ASSISTANTS: none   ANESTHESIA:   local and MAC  EBL:  Total I/O In: 800 [I.V.:800] Out: -   BLOOD ADMINISTERED:none  DRAINS: none   LOCAL MEDICATIONS USED:  MARCAINE     SPECIMEN:  No Specimen  DISPOSITION OF SPECIMEN:  N/A  COUNTS:  YES  TOURNIQUET:   Total Tourniquet Time Documented: Upper Arm (Left) - 17 minutes  DICTATION: .Other Dictation: Dictation Number [id number]  PLAN OF CARE: Discharge to home after PACU  PATIENT DISPOSITION:  PACU - hemodynamically stable.

## 2012-12-20 NOTE — Op Note (Signed)
Dictation [id number]

## 2012-12-20 NOTE — Anesthesia Preprocedure Evaluation (Signed)
Anesthesia Evaluation  Patient identified by MRN, date of birth, ID band Patient awake    Reviewed: Allergy & Precautions, H&P , NPO status , Patient's Chart, lab work & pertinent test results  Airway Mallampati: II TM Distance: <3 FB Neck ROM: Full    Dental   Pulmonary sleep apnea ,  breath sounds clear to auscultation        Cardiovascular hypertension, Rate:Normal     Neuro/Psych  Headaches,    GI/Hepatic GERD-  ,  Endo/Other  diabetesHypothyroidism Morbid obesity  Renal/GU      Musculoskeletal   Abdominal (+) + obese,   Peds  Hematology   Anesthesia Other Findings   Reproductive/Obstetrics                           Anesthesia Physical Anesthesia Plan  ASA: III  Anesthesia Plan: MAC   Post-op Pain Management:    Induction: Intravenous  Airway Management Planned: Simple Face Mask  Additional Equipment:   Intra-op Plan:   Post-operative Plan:   Informed Consent: I have reviewed the patients History and Physical, chart, labs and discussed the procedure including the risks, benefits and alternatives for the proposed anesthesia with the patient or authorized representative who has indicated his/her understanding and acceptance.     Plan Discussed with: CRNA and Surgeon  Anesthesia Plan Comments:         Anesthesia Quick Evaluation

## 2012-12-20 NOTE — H&P (Signed)
Morgan [name] is an 47 y.o. female.   Chief Complaint: left trigger thumb HPI: 47 yo rhd female with triggering and pain in left thumb x 4 months.  Has had injections of flexor sheath without relief.  She would like a trigger release.  Past Medical History  Diagnosis Date  . Unspecified hypothyroidism   . Constipation, chronic   . Vitamin B 12 deficiency   . Barrett's esophagus   . Osteoarthritis   . Gastroparesis   . Allergic rhinitis   . Chronic respiratory failure   . Migraine headache   . Hypertension   . Diabetes mellitus without complication   . GERD (gastroesophageal reflux disease)   . OSA (obstructive sleep apnea)     uses a cpap    Past Surgical History  Procedure Date  . Cholecystectomy 2005  . Appendectomy 1978  . Tonsillectomy at age 34  . Shoulder arthroscopy 7/12    left-dsc    Family History  Problem Relation Age of Onset  . Asthma Father   . Asthma Sister   . Allergies Mother   . Allergies Father   . Allergies Brother   . Allergies Sister   . Heart disease Father     enlarged heart  . Colon cancer Sister    Social History:  reports that she has never smoked. She does not have any smokeless tobacco history on file. She reports that she does not drink alcohol or use illicit drugs.  Allergies:  Allergies  Allergen Reactions  . Codeine     REACTION: increased BP  . Ketoconazole     REACTION: hives, swelling  . Statins     REACTION: leg crapms    Medications Prior to Admission  Medication Sig Dispense Refill  . calcium-vitamin D (OSCAL WITH D) 500-200 MG-UNIT per tablet Take 1 tablet by mouth daily.        . cyanocobalamin (,VITAMIN B-12,) 1000 MCG/ML injection Inject 1,000 mcg into the muscle every 30 (thirty) days.      Marland Kitchen etodolac (LODINE) 500 MG tablet Take 500 mg by mouth 2 (two) times daily.        . fenofibrate micronized (ANTARA) 130 MG capsule Take 130 mg by mouth daily before breakfast.        . ferrous gluconate (FERGON) 325 MG  tablet Take 325 mg by mouth daily with breakfast.        . furosemide (LASIX) 20 MG tablet Take 20 mg by mouth daily.        Marland Kitchen levothyroxine (SYNTHROID, LEVOTHROID) 150 MCG tablet Take 150 mcg by mouth daily.        Marland Kitchen loratadine (CLARITIN) 10 MG tablet Take 10 mg by mouth daily. Prn        . lubiprostone (AMITIZA) 24 MCG capsule Take 24 mcg by mouth 2 (two) times daily with a meal.        . MEDROXYPROGESTERONE ACETATE PO Take by mouth. Take 1 tab daily for 12 days per month every other month       . metFORMIN (GLUCOPHAGE) 500 MG tablet Take 500 mg by mouth every evening.      . Multiple Vitamin (MULTIVITAMIN) capsule Take 1 capsule by mouth daily.        Marland Kitchen nystatin (MYCOSTATIN) powder Apply topically 2 (two) times daily as needed.        Marland Kitchen omeprazole (PRILOSEC) 40 MG capsule Take 40 mg by mouth daily.        . potassium  chloride (K-DUR,KLOR-CON) 10 MEQ tablet Take 10 mEq by mouth daily.        . Probiotic Product (HEALTHY COLON PO) Take 1 capsule by mouth daily.      . pseudoephedrine (SUDAFED) 30 MG tablet Take 30 mg by mouth every 4 (four) hours as needed.        Marland Kitchen QUEtiapine Fumarate (SEROQUEL XR) 150 MG TB24 Take by mouth. 1 po q day       . sertraline (ZOLOFT) 100 MG tablet Take 1 and 1/2 tab daily      . traMADol (ULTRAM) 50 MG tablet Take 50 mg by mouth. 1 to 2 tabs po q 4-6 hours prn       . valsartan-hydrochlorothiazide (DIOVAN-HCT) 160-25 MG per tablet Take 1 tablet by mouth daily.          Results for orders placed during the hospital encounter of 12/20/12 (from the past 48 hour(s))  BASIC METABOLIC PANEL     Status: Abnormal   Collection Time   12/19/12 10:30 AM      Component Value Range Comment   Sodium 140  135 - 145 mEq/L    Potassium 3.6  3.5 - 5.1 mEq/L    Chloride 100  96 - 112 mEq/L    CO2 30  19 - 32 mEq/L    Glucose, Bld 137 (*) 70 - 99 mg/dL    BUN 19  6 - 23 mg/dL    Creatinine, Ser 1.61  0.50 - 1.10 mg/dL    Calcium 9.6  8.4 - 09.6 mg/dL    GFR calc non Af Amer  >90  >90 mL/min    GFR calc Af Amer >90  >90 mL/min     No results found.   A comprehensive review of systems was negative except for: Hematologic/lymphatic: positive for anemia Neurological: positive for headaches Behavioral/Psych: positive for anxiety, depression and sleep disturbance  Blood pressure 136/82, pulse 73, temperature 98 F (36.7 C), temperature source Oral, resp. rate 20, height 5\' 6"  (1.676 m), weight 144.924 kg (319 lb 8 oz), last menstrual period 12/19/2012, SpO2 99.00%.  General appearance: alert, cooperative and appears stated age Head: Normocephalic, without obvious abnormality, atraumatic Neck: supple, symmetrical, trachea midline Resp: clear to auscultation bilaterally Cardio: regular rate and rhythm GI: non tender Extremities: intact sensation and capillary refill all digits.  +epl/fpl/io.  palpable flexor tendon nodule left thumb at mp joint. Pulses: 2+ and symmetric Skin: Skin color, texture, turgor normal. No rashes or lesions Neurologic: Grossly normal Incision/Wound: na  Assessment/Plan Left trigger thumb.  Non operative and operative treatment options were discussed with the patient and patient wishes to proceed with operative treatment. Risks, benefits, and alternatives of surgery were discussed and the patient agrees with the plan of care.   [name] R 12/20/2012, 8:45 AM

## 2012-12-21 ENCOUNTER — Encounter (HOSPITAL_BASED_OUTPATIENT_CLINIC_OR_DEPARTMENT_OTHER): Payer: Self-pay | Admitting: Orthopedic Surgery

## 2012-12-21 NOTE — Op Note (Signed)
Morgan [name], Morgan [name] NO.:  [id number]  MEDICAL RECORD NO.:  [id number]  LOCATION:                                 FACILITY:  PHYSICIAN:  Betha Loa, MD        DATE OF BIRTH:  1966/11/09  DATE OF PROCEDURE:  12/20/2012 DATE OF DISCHARGE:                              OPERATIVE REPORT   PREOPERATIVE DIAGNOSIS:  Left thumb trigger digit.  POSTOPERATIVE DIAGNOSIS:  Left thumb trigger digit.  PROCEDURE:  Left thumb trigger release.  SURGEON:  Betha Loa, MD  ASSISTANT:  None.  ANESTHESIA:  MAC with local.  IV FLUIDS:  Per anesthesia flow sheet.  ESTIMATED BLOOD LOSS:  Minimal.  COMPLICATIONS:  None.  SPECIMENS:  None.  TOURNIQUET TIME:  17 minutes.  DISPOSITION:  Stable to PACU.  INDICATIONS:  Morgan [name] is a 47 year old right-hand dominant female who has had trigger in her left thumb for approximately 4 months.  She has had this injected twice with recurrence of triggering pain.  Flexor tendon nodule could be palpated and was painful at her last visit.  She wished to have a trigger digit release.  Risks, benefits, and alternatives of surgery were discussed including the risk of blood loss, infection, damage to nerves, vessels, tendons, ligaments, bone, failure of surgery, need for additional surgery, complications with wound healing, recurrence of triggering.  She voiced understanding of these risks and elected to proceed.  OPERATIVE COURSE:  After being identified preoperatively by myself, the patient and I agreed upon procedure and site of procedure.  Surgical site was marked.  The risks, benefits, and alternatives of surgery were reviewed and she wished to proceed.  Surgical consent had been signed. She was given 2 g of IV Ancef as preoperative antibiotic prophylaxis. She was transported to the operating room, placed on the operative table in supine position with left upper extremity on arm board.  MAC anesthesia was induced.  A surgical  pause was performed between surgeons, anesthesia, and operating staff, and all were in agreement as to the patient, procedure, and site of procedure.  The surgical site was injected with 10 mL of 0.25% plain Marcaine.  The left upper extremity was prepped and draped in normal sterile orthopedic fashion.  Tourniquet at the proximal aspect of the forearm was inflated to 250 mmHg after exsanguination the limb with an Esmarch bandage.  A transverse incision was made at the proximal flexion crease of the thumb through the skin only.  Spreading technique was used in deeper tissues.  The radial and ulnar digital nerves were identified and were protected throughout the case.  The A1 pulley was identified.  It was thickened.  It was incised sharply toward its radial side.  The oblique pulley was identified and was protected.  Spreading technique was used proximally on the tendon to ensure there was no tight band more proximally, which there was not. The wound was copiously irrigated with sterile saline.  It was closed with 4-0 nylon in a horizontal mattress fashion.  It was then dressed with sterile Xeroform, 4x4s, and wrapped with Kling and a Coban dressing lightly.  Tourniquet was deflated at 17 minutes.  Fingertips  were pink with brisk capillary refill after deflation of the tourniquet. Operative drapes were broken down and the patient was awoken from anesthesia safely.  She was transferred back to stretcher and taken to PACU in stable condition.  I will see her back in the office in 1 week for postoperative followup.  I will give her Percocet 5/325, 1-2 p.o. q.6 hours p.r.n. pain, dispensed 30.     Betha Loa, MD     KK/MEDQ  D:  12/20/2012  T:  12/21/2012  Job:  [id number]

## 2013-03-12 ENCOUNTER — Ambulatory Visit (INDEPENDENT_AMBULATORY_CARE_PROVIDER_SITE_OTHER): Payer: Medicare Other | Admitting: Family Medicine

## 2013-03-12 ENCOUNTER — Encounter: Payer: Self-pay | Admitting: Family Medicine

## 2013-03-12 VITALS — BP 136/77 | HR 80 | Temp 97.2°F | Ht 67.0 in | Wt 323.0 lb

## 2013-03-12 DIAGNOSIS — J9610 Chronic respiratory failure, unspecified whether with hypoxia or hypercapnia: Secondary | ICD-10-CM

## 2013-03-12 DIAGNOSIS — G4733 Obstructive sleep apnea (adult) (pediatric): Secondary | ICD-10-CM

## 2013-03-12 DIAGNOSIS — E785 Hyperlipidemia, unspecified: Secondary | ICD-10-CM

## 2013-03-12 DIAGNOSIS — I10 Essential (primary) hypertension: Secondary | ICD-10-CM

## 2013-03-12 DIAGNOSIS — E039 Hypothyroidism, unspecified: Secondary | ICD-10-CM

## 2013-03-12 DIAGNOSIS — E538 Deficiency of other specified B group vitamins: Secondary | ICD-10-CM

## 2013-03-12 DIAGNOSIS — E119 Type 2 diabetes mellitus without complications: Secondary | ICD-10-CM | POA: Insufficient documentation

## 2013-03-12 HISTORY — DX: Hyperlipidemia, unspecified: E78.5

## 2013-03-12 LAB — HEPATIC FUNCTION PANEL
ALT: 43 U/L — ABNORMAL HIGH (ref 0–35)
AST: 37 U/L (ref 0–37)
Albumin: 4.1 g/dL (ref 3.5–5.2)
Alkaline Phosphatase: 58 U/L (ref 39–117)
Bilirubin, Direct: 0.1 mg/dL (ref 0.0–0.3)
Indirect Bilirubin: 0.3 mg/dL (ref 0.0–0.9)
Total Bilirubin: 0.4 mg/dL (ref 0.3–1.2)
Total Protein: 6.6 g/dL (ref 6.0–8.3)

## 2013-03-12 LAB — BASIC METABOLIC PANEL WITH GFR
BUN: 17 mg/dL (ref 6–23)
CO2: 31 mEq/L (ref 19–32)
Calcium: 9.4 mg/dL (ref 8.4–10.5)
Chloride: 100 mEq/L (ref 96–112)
Creat: 0.72 mg/dL (ref 0.50–1.10)
GFR, Est African American: >89 mL/min
GFR, Est Non African American: >89 mL/min
Glucose, Bld: 101 mg/dL — ABNORMAL HIGH (ref 70–99)
Potassium: 3.8 mEq/L (ref 3.5–5.3)
Sodium: 140 mEq/L (ref 135–145)

## 2013-03-12 LAB — MICROALBUMIN, URINE: Microalb, Ur: 1 mg/dL (ref 0.00–1.89)

## 2013-03-12 LAB — THYROID PANEL WITH TSH
Free Thyroxine Index: 2.3 (ref 1.0–3.9)
T3 Uptake: 25.9 % (ref 22.5–37.0)
T4, Total: 8.9 ug/dL (ref 5.0–12.5)
TSH: 2.102 u[IU]/mL (ref 0.350–4.500)

## 2013-03-12 LAB — VITAMIN B12: Vitamin B-12: >2000 pg/mL — ABNORMAL HIGH (ref 211–911)

## 2013-03-12 LAB — POCT GLYCOSYLATED HEMOGLOBIN (HGB A1C): Hemoglobin A1C: 6.8

## 2013-03-12 MED ORDER — CYANOCOBALAMIN 1000 MCG/ML IJ SOLN
1000.0000 ug | Freq: Once | INTRAMUSCULAR | Status: AC
  Administered 2013-03-12: 1000 ug via INTRAMUSCULAR

## 2013-03-12 NOTE — Assessment & Plan Note (Signed)
Continue to follow per Dr. Delford Field. On portable oxygen 2 L per minute via nasal cannula.

## 2013-03-12 NOTE — Assessment & Plan Note (Signed)
Dietary changes to be made. Exercise program in a chair. Will check an NMR lipomed  Today. Increase in vegetables recommended. Nutrition books recommended. Information will be in the AVS.

## 2013-03-12 NOTE — Progress Notes (Signed)
Tolerated injection well. 

## 2013-03-12 NOTE — Progress Notes (Signed)
Subjective:     Patient ID: Morgan [name], female   DOB: 1966-07-09, 47 y.o.   MRN: [id number]  HPI patient comes in for follow up of her multiple medical problems. She is morbidly obese with significant impact on her health. She claims that she is incapable of exercise. Her diet also is not reflective of a healthy diet. She is obstructive sleep apnea with chronic respiratory failure and she is on portable oxygen 2 L per minute she is to sleep with a CPAP at night.  In regards to her hypertension no headache chest pain palpitations or pedal edema. In regards to her diabetes she'll brought her CBG reports. She runs between 100-120. Her glucose looks actually very good on her present treatment regimen. In regards to her obesity her weight fluctuates 100 pounds at the time she is capable of losing weight. Family stress intervenes and then she gained that weight by eating foods for comfort. Hyperlipidemia: Diet is high in fat. Arthritis of the knees: Worsened by her weight gain. Gastroparesis. Vitamin B12 deficiency she is here for a B12 shot which he receives monthly.  PMH/PSH: reviewed/updated in Epic  SH/FH: reviewed/updated in Epic  Allergies: reviewed/updated in Epic  Medications: reviewed/updated in Epic  Immunizations: reviewed/updated in Epic     Review of Systems  Constitutional: Positive for activity change, appetite change and fatigue.  HENT: Negative.   Eyes: Negative.   Respiratory: Positive for apnea and shortness of breath.   Cardiovascular: Negative.   Gastrointestinal: Negative.   Endocrine: Negative.   Genitourinary: Negative.   Musculoskeletal: Positive for arthralgias.  Skin: Negative.   Allergic/Immunologic: Negative.   Neurological: Negative.   Hematological: Negative.   Psychiatric/Behavioral: Negative.        Objective:   Physical Exam On examination she appeared in good health and spirits. She is on oxygen via nasal cannula. Morbidly obese .  Vital signs as documented. BP 136/77  Pulse 80  Temp(Src) 97.2 F (36.2 C) (Oral)  Ht 5\' 7"  (1.702 m)  Wt 323 lb (146.512 kg)  BMI 50.58 kg/m2  SpO2 98%  LMP 02/20/2013  Skin warm and dry and without overt rashes.  Neck without JVD.  Lungs clear.  Heart exam notable for regular rhythm, normal sounds and absence of murmurs, rubs or gallops. Abdomen unremarkable and without evidence of organomegaly, masses, or abdominal aortic enlargement.  Extremities nonedematous. The knee joints have palpable crepitus. Neurologic: Oriented to time place and person. Nonfocal    Assessment:     RESPIRATORY FAILURE, CHRONIC Continue to follow per Dr. Delford Field. On portable oxygen 2 L per minute via nasal cannula.  OBSTRUCTIVE SLEEP APNEA Continue using her CPAP. 15 is the pressure setting  Morbid obesity Diet and Exercise discussed with patient. For nutrition information, I recommend books: Eat to Live by Dr Monico Hoar. Prevent and Reverse Heart Disease by Dr Suzzette Righter.  Exercise recommendations are:  If unable to walk, then the patient can exercise in a chair 3 times a day. By flapping arms like a bird gently and raising legs outwards to the front.  If ambulatory, the patient can go for walks for 30 minutes 3 times a week. Then increase the intensity and duration as tolerated. Goal is to try to attain exercise frequency to 5 times a week. Best to perform resistance exercises 2 days a week and cardio type exercises 3 days per week.   DM (diabetes mellitus) CBGs/sugars have been running low 100. This is satisfactory. She is on  metformin. She is tolerating this. We'll check her labs today hemoglobin A1c. Microalbumin urea test.  HLD (hyperlipidemia) Dietary changes to be made. Exercise program in a chair. Will check an NMR lipomed  Today. Increase in vegetables recommended. Nutrition books recommended. Information will be in the AVS.  Vitamin B 12 deficiency Patient received her B12  injection today.        Plan:     Orders Placed This Encounter  Procedures  . MyChart Exercise Flowsheet    Order Specific Question:  After how many days would you like to receive a notification of this patient's flowsheet entries?    Answer:  46    Order Specific Question:  After how many readings would you like to receive notification of this patient's flowsheet entries?    Answer:  1  . MyChart Weight Flowsheet    Order Specific Question:  After how many days would you like to receive a notification of this patient's flowsheet entries?    Answer:  6    Order Specific Question:  After how many readings would you like to receive notification of this patient's flowsheet entries?    Answer:  1  . NMR Lipoprofile with Lipids  . Vitamin B12  . Thyroid Panel With TSH  . Hepatic function panel  . Microalbumin, urine  . BASIC METABOLIC PANEL WITH GFR  . POCT glycosylated hemoglobin (Hb A1C)   Meds ordered this encounter  Medications  . potassium chloride (K-DUR) 10 MEQ tablet    Sig:   . traMADol (ULTRAM) 50 MG tablet    Sig: Take 50 mg by mouth 2 (two) times daily.   . cyanocobalamin ((VITAMIN B-12)) injection 1,000 mcg    Sig:                Also discussed with this patient for 45 minutes on making lifestyle therapeutic changes.  In the AVS. patient will receive instructions on B12 deficiency and diet and exercise. Diet and Exercise discussed with patient. For nutrition information, I recommend books: Eat to Live by Dr Monico Hoar. Prevent and Reverse Heart Disease by Dr Suzzette Righter.  Exercise recommendations are:  If unable to walk, then the patient can exercise in a chair 3 times a day. By flapping arms like a bird gently and raising legs outwards to the front.  If ambulatory, the patient can go for walks for 30 minutes 3 times a week. Then increase the intensity and duration as tolerated. Goal is to try to attain exercise frequency to 5 times a week. Best to  perform resistance exercises 2 days a week and cardio type exercises 3 days per week.  Await labs. Encouraged patient to sign up for my chart.  Return to clinic in 3 months.  Viola Placeres P. Modesto Charon, M.D.

## 2013-03-12 NOTE — Assessment & Plan Note (Signed)
Continue using her CPAP. 15 is the pressure setting

## 2013-03-12 NOTE — Assessment & Plan Note (Signed)
CBGs/sugars have been running low 100. This is satisfactory. She is on metformin. She is tolerating this. We'll check her labs today hemoglobin A1c. Microalbumin urea test.

## 2013-03-12 NOTE — Patient Instructions (Addendum)
Vitamin B12 Deficiency Not having enough vitamin B12 is called a deficiency. Vitamin B12 is an important vitamin. Your body needs vitamin B12 to:   Make red blood cells.  Make DNA. This is the genetic material inside all of your cells.  Help your nerves work properly so they can carry messages from your brain to your body. CAUSES  Not eating enough foods that contain vitamin B12.  Not having enough stomach acid and digestive juices. The body needs these to absorb vitamin B12 from the food you eat.  Having certain digestive system diseases that make it hard to absorb vitamin B12. These diseases include Crohn's disease, chronic pancreatitis, and cystic fibrosis.  Having pernicious anemia, which is a condition where the body has too few red blood cells. People with this condition do not make enough of a protein called "intrinsic factor," which is needed to absorb vitamin B12.  Having a surgery in which part of the stomach or small intestine is removed.  Taking certain medicines that make it hard for the body to absorb vitamin B12. These medicines include:  Heartburn medicine (antacids and proton pump inhibitors).  A certain antibiotic medicine called neomycin, which fights infection.  Some medicines used to treat diabetes, tuberculosis, gout, and high cholesterol. RISK FACTORS Risk factors are things that make you more likely to develop a vitamin B12 deficiency. They include:  Being older than 50.  Being a vegetarian.  Being pregnant and a vegetarian or having a poor diet.  Taking certain drugs.  Being an alcoholic. SYMPTOMS You may have a vitamin B12 deficiency with no symptoms. However, a vitamin B12 deficiency can cause health problems like anemia and nerve damage. These health problems can lead to many possible symptoms, including:  Weakness.  Fatigue.  Loss of appetite.  Weight loss.  Numbness or tingling in your hands and feet.  Redness and burning of the  tongue.  Confusion or memory problems.  Depression.  Dizziness.  Sensory problems, such as loss of taste, color blindness, and ringing in the ears.  Diarrhea or constipation.  Trouble walking. DIAGNOSIS Various types of tests can be given to help find the cause of your vitamin B12 deficiency. These tests include:  A complete blood count (CBC). This test gives your caregiver an overall picture of what makes up your blood.  A blood test to measure your B12 level.  A blood test to measure intrinsic factor.  An endoscopy. This procedure uses a thin tube with a camera on the end to look into your stomach or intestines. TREATMENT Treatment for vitamin B12 deficiency depends on what is causing it. Common options include:  Changing your eating and drinking habits, such as:  Eating more foods that contain vitamin B12.  Not drinking as much alcohol or any alcohol.  Taking vitamin B12 supplements. Your caregiver will tell you what dose is best for you.  Getting vitamin B12 injections. Some people get these a few times a week. Others get them once a month. HOME CARE INSTRUCTIONS  Take all supplements as directed by your caregiver. Follow the directions carefully.  Get any injections your caregiver prescribes. Do not miss your appointments.  Eat lots of healthy foods that contain vitamin B12. Ask your caregiver if you should work with a nutritionist. Good things to include in your diet are:  Meat.  Poultry.  Fish.  Eggs.  Fortified cereal and dairy products. This means vitamin B12 has been added to the food. Check the label on the   package to be sure.  Do not abuse alcohol.  Keep all follow-up appointments. Your caregiver will need to perform blood tests to make sure your vitamin B12 deficiency is going away. SEEK MEDICAL CARE IF:  You have any questions about your treatment.  Your symptoms come back. MAKE SURE YOU:  Understand these instructions.  Will watch your  condition.  Will get help right away if you are not doing well or get worse. Document Released: 02/20/2012 Document Reviewed: 02/20/2012 [facility] Patient Information 2013 [place], [place].    Diet and Exercise discussed with patient. For nutrition information, I recommend books: Eat to Live by Dr Monico Hoar. Prevent and Reverse Heart Disease by Dr Suzzette Righter.  Exercise recommendations are:  If unable to walk, then the patient can exercise in a chair 3 times a day. By flapping arms like a bird gently and raising legs outwards to the front.  If ambulatory, the patient can go for walks for 30 minutes 3 times a week. Then increase the intensity and duration as tolerated. Goal is to try to attain exercise frequency to 5 times a week. Best to perform resistance exercises 2 days a week and cardio type exercises 3 days per week.

## 2013-03-12 NOTE — Assessment & Plan Note (Signed)
Diet and Exercise discussed with patient. For nutrition information, I recommend books: Eat to Live by Dr Joel Fuhrman. Prevent and Reverse Heart Disease by Dr Caldwell Esselstyn.  Exercise recommendations are:  If unable to walk, then the patient can exercise in a chair 3 times a day. By flapping arms like a bird gently and raising legs outwards to the front.  If ambulatory, the patient can go for walks for 30 minutes 3 times a week. Then increase the intensity and duration as tolerated. Goal is to try to attain exercise frequency to 5 times a week. Best to perform resistance exercises 2 days a week and cardio type exercises 3 days per week.  

## 2013-03-12 NOTE — Assessment & Plan Note (Signed)
Patient received her B-12 injection today.

## 2013-03-13 LAB — NMR LIPOPROFILE WITH LIPIDS
Cholesterol, Total: 231 mg/dL — ABNORMAL HIGH (ref <200)
HDL Particle Number: 29.7 umol/L — ABNORMAL LOW (ref >=30.5)
HDL Size: 9.1 nm — ABNORMAL LOW (ref >=9.2)
HDL-C: 36 mg/dL — ABNORMAL LOW (ref >=40)
LDL (calc): 107 mg/dL — ABNORMAL HIGH (ref <100)
LDL Particle Number: 2300 nmol/L — ABNORMAL HIGH (ref <1000)
LDL Size: 19.5 nm — ABNORMAL LOW (ref >20.5)
LP-IR Score: 76 — ABNORMAL HIGH (ref <=45)
Large HDL-P: 1.4 umol/L — ABNORMAL LOW (ref >=4.8)
Large VLDL-P: 22.5 nmol/L — ABNORMAL HIGH (ref <=2.7)
Small LDL Particle Number: 2013 nmol/L — ABNORMAL HIGH (ref <=527)
Triglycerides: 439 mg/dL — ABNORMAL HIGH (ref <150)
VLDL Size: 56.2 nm — ABNORMAL HIGH (ref >=46.6)

## 2013-03-13 NOTE — Progress Notes (Signed)
Quick Note:  Labs abnormal. Needs appointment with Pharmacist for nutrition education, diet review and optimize treat for lipids and DM. Lipids too high. The HgbA1C is close to goal. Thanks ______

## 2013-04-03 ENCOUNTER — Encounter: Payer: Self-pay | Admitting: *Deleted

## 2013-04-08 ENCOUNTER — Ambulatory Visit: Payer: Medicare Other

## 2013-04-11 ENCOUNTER — Other Ambulatory Visit: Payer: Self-pay | Admitting: Physician Assistant

## 2013-04-12 ENCOUNTER — Ambulatory Visit (INDEPENDENT_AMBULATORY_CARE_PROVIDER_SITE_OTHER): Payer: Medicare Other | Admitting: *Deleted

## 2013-04-12 DIAGNOSIS — E538 Deficiency of other specified B group vitamins: Secondary | ICD-10-CM

## 2013-04-12 MED ORDER — CYANOCOBALAMIN 1000 MCG/ML IJ SOLN
1000.0000 ug | INTRAMUSCULAR | Status: DC
  Administered 2013-04-12 – 2015-09-11 (×28): 1000 ug via INTRAMUSCULAR

## 2013-04-12 NOTE — Progress Notes (Signed)
Tolerated well

## 2013-05-08 ENCOUNTER — Other Ambulatory Visit: Payer: Self-pay | Admitting: Physician Assistant

## 2013-05-14 ENCOUNTER — Ambulatory Visit (INDEPENDENT_AMBULATORY_CARE_PROVIDER_SITE_OTHER): Payer: Medicare Other | Admitting: General Practice

## 2013-05-14 DIAGNOSIS — E538 Deficiency of other specified B group vitamins: Secondary | ICD-10-CM

## 2013-05-14 NOTE — Progress Notes (Signed)
Patient tolerated well.

## 2013-05-14 NOTE — Patient Instructions (Signed)
Vitamin B12 Injections Every person needs vitamin B12. A deficiency develops when the body does not get enough of it. One way to overcome this is by getting B12 shots (injections). A B12 shot puts the vitamin directly into muscle tissue. This avoids any problems your body might have in absorbing it from food or a pill. In some people, the body has trouble using the vitamin correctly. This can cause a B12 deficiency. Not consuming enough of the vitamin can also cause a deficiency. Getting enough vitamin B12 can be hard for elderly people. Sometimes, they do not eat a well-balanced diet. The elderly are also more likely than younger people to have medical conditions or take medications that can lead to a deficiency. WHAT DOES VITAMIN B12 DO? Vitamin B12 does many things to help the body work right:  It helps the body make healthy red blood cells.  It helps maintain nerve cells.  It is involved in the body's process of converting food into energy (metabolism).  It is needed to make the genetic material in all cells (DNA). VITAMIN B12 FOOD SOURCES Most people get plenty of vitamin B12 through the foods they eat. It is present in:  Meat, fish, poultry, and eggs.  Milk and milk products.  It also is added when certain foods are made, including some breads, cereals and yogurts. The food is then called "fortified". CAUSES The most common causes of vitamin B12 deficiency are:  Pernicious anemia. The condition develops when the body cannot make enough healthy red blood cells. This stems from a lack of a protein made in the stomach (intrinsic factor). People without this protein cannot absorb enough vitamin B12 from food.  Malabsorption. This is when the body cannot absorb the vitamin. It can be caused by:  Pernicious anemia.  Surgery to remove part or all of the stomach can lead to malabsorption. Removal of part or all of the small intestine can also cause malabsorption.  Vegetarian diet.  People who are strict about not eating foods from animals could have trouble taking in enough vitamin B12 from diet alone.  Medications. Some medicines have been linked to B12 deficiency, such as Metformin (a drug prescribed for type 2 diabetes). Long-term use of stomach acid suppressants also can keep the vitamin from being absorbed.  Intestinal problems such as inflammatory bowel disease. If there are problems in the digestive tract, vitamin B12 may not be absorbed in good enough amounts. SYMPTOMS People who do not get enough B12 can develop problems. These can include:  Anemia. This is when the body has too few red blood cells. Red blood cells carry oxygen to the rest of the body. Without a healthy supply of red blood cells, people can feel:  Tired (fatigued).  Weak.  Severe anemia can cause:  Shortness of breath.  Dizziness.  Rapid heart rate.  Paleness.  Other Vitamin B12 deficiency symptoms include:  Diarrhea.  Numbness or tingling in the hands or feet.  Loss of appetite.  Confusion.  Sores on the tongue or in the mouth. LET YOUR CAREGIVER KNOW ABOUT:  Any allergies. It is very important to know if you are allergic or sensitive to cobalt. Vitamin B12 contains cobalt.  Any history of kidney disease.  All medications you are taking. Include prescription and over-the-counter medicines, herbs and creams.  Whether you are pregnant or breast-feeding.  If you have Leber's disease, a hereditary eye condition, vitamin B12 could make it worse. RISKS AND COMPLICATIONS Reactions to an injection are   usually temporary. They might include:  Pain at the injection site.  Redness, swelling or tenderness at the site.  Headache, dizziness or weakness.  Nausea, upset stomach or diarrhea.  Numbness or tingling.  Fever.  Joint pain.  Itching or rash. If a reaction does not go away in a short while, talk with your healthcare provider. A change in the way the shots are  given, or where they are given, might need to be made. BEFORE AN INJECTION To decide whether B12 injections are right for you, your healthcare provider will probably:  Ask about your medical history.  Ask questions about your diet.  Ask about symptoms such as:  Have you felt weak?  Do you feel unusually tired?  Do you get dizzy?  Order blood tests. These may include a test to:  Check the level of red cells in your blood.  Measure B12 levels.  Check for the presence of intrinsic factor. VITAMIN B12 INJECTIONS How often you will need a vitamin B12 injection will depend on how severe your deficiency is. This also will affect how long you will need to get them. People with pernicious anemia usually get injections for their entire life. Others might get them for a shorter period. For many people, injections are given daily or weekly for several weeks. Then, once B12 levels are normal, injections are given just once a month. If the cause of the deficiency can be fixed, the injections can be stopped. Talk with your healthcare provider about what you should expect. For an injection:  The injection site will be cleaned with an alcohol swab.  Your healthcare provider will insert a needle directly into a muscle. Most any muscle can be used. Most often, an arm muscle is used. A buttocks muscle can also be used. Many people say shots in that area are less painful.  A small adhesive bandage may be put over the injection site. It usually can be taken off in an hour or less. Injections can be given by your healthcare provider. In some cases, family members give them. Sometimes, people give them to themselves. Talk with your healthcare provider about what would be best for you. If someone other than your healthcare provider will be giving the shots, the person will need to be trained to give them correctly. HOME CARE INSTRUCTIONS   You can remove the adhesive bandage within an hour of getting a  shot.  You should be able to go about your normal activities right away.  Avoid drinking large amounts of alcohol while taking vitamin B12 shots. Alcohol can interfere with the body's use of the vitamin. SEEK MEDICAL CARE IF:   Pain, redness, swelling or tenderness at the injection site does not get better or gets worse.  Headache, dizziness or weakness does not go away.  You develop a fever of more than 100.5 F (38.1 C). SEEK IMMEDIATE MEDICAL CARE IF:   You have chest pain.  You develop shortness of breath.  You have muscle weakness that gets worse.  You develop numbness, weakness or tingling on one side or one area of the body.  You have symptoms of an allergic reaction, such as:  Hives.  Difficulty breathing.  Swelling of the lips, face, tongue or throat.  You develop a fever of more than 102.0 F (38.9 C). MAKE SURE YOU:   Understand these instructions.  Will watch your condition.  Will get help right away if you are not doing well or get worse. Document   Released: 02/24/2009 Document Revised: 02/20/2012 Document Reviewed: 02/24/2009 [employer] Patient Information 2014 [employer].  

## 2013-06-11 ENCOUNTER — Ambulatory Visit: Payer: Medicare Other | Admitting: Family Medicine

## 2013-06-13 ENCOUNTER — Ambulatory Visit (INDEPENDENT_AMBULATORY_CARE_PROVIDER_SITE_OTHER): Payer: Medicare Other | Admitting: *Deleted

## 2013-06-13 DIAGNOSIS — E538 Deficiency of other specified B group vitamins: Secondary | ICD-10-CM

## 2013-06-17 ENCOUNTER — Other Ambulatory Visit: Payer: Self-pay

## 2013-06-17 MED ORDER — METFORMIN HCL 500 MG PO TABS: 500.0000 mg | ORAL_TABLET | Freq: Every evening | ORAL | Status: DC

## 2013-06-17 MED ORDER — LEVOTHYROXINE SODIUM 150 MCG PO TABS: 150.0000 ug | ORAL_TABLET | Freq: Every day | ORAL | Status: DC

## 2013-06-18 ENCOUNTER — Ambulatory Visit (INDEPENDENT_AMBULATORY_CARE_PROVIDER_SITE_OTHER): Payer: Medicare Other | Admitting: Family Medicine

## 2013-06-18 ENCOUNTER — Encounter: Payer: Self-pay | Admitting: Family Medicine

## 2013-06-18 VITALS — BP 125/76 | HR 60 | Temp 97.4°F | Ht 67.0 in | Wt 316.8 lb

## 2013-06-18 DIAGNOSIS — E119 Type 2 diabetes mellitus without complications: Secondary | ICD-10-CM

## 2013-06-18 DIAGNOSIS — E876 Hypokalemia: Secondary | ICD-10-CM

## 2013-06-18 DIAGNOSIS — E785 Hyperlipidemia, unspecified: Secondary | ICD-10-CM

## 2013-06-18 DIAGNOSIS — E538 Deficiency of other specified B group vitamins: Secondary | ICD-10-CM

## 2013-06-18 DIAGNOSIS — F319 Bipolar disorder, unspecified: Secondary | ICD-10-CM

## 2013-06-18 LAB — COMPLETE METABOLIC PANEL WITH GFR
ALT: 30 U/L (ref 0–35)
AST: 24 U/L (ref 0–37)
Albumin: 4.2 g/dL (ref 3.5–5.2)
Alkaline Phosphatase: 54 U/L (ref 39–117)
BUN: 23 mg/dL (ref 6–23)
CO2: 31 mEq/L (ref 19–32)
Calcium: 9.9 mg/dL (ref 8.4–10.5)
Chloride: 102 mEq/L (ref 96–112)
Creat: 0.82 mg/dL (ref 0.50–1.10)
GFR, Est African American: >89 mL/min
GFR, Est Non African American: 87 mL/min
Glucose, Bld: 106 mg/dL — ABNORMAL HIGH (ref 70–99)
Potassium: 3.7 mEq/L (ref 3.5–5.3)
Sodium: 142 mEq/L (ref 135–145)
Total Bilirubin: 0.4 mg/dL (ref 0.3–1.2)
Total Protein: 6.6 g/dL (ref 6.0–8.3)

## 2013-06-18 LAB — VITAMIN B12: Vitamin B-12: 742 pg/mL (ref 211–911)

## 2013-06-18 LAB — POCT GLYCOSYLATED HEMOGLOBIN (HGB A1C): Hemoglobin A1C: 6.5

## 2013-06-18 LAB — POCT UA - MICROALBUMIN: Microalbumin Ur, POC: POSITIVE mg/L

## 2013-06-18 MED ORDER — GLUCOSE BLOOD VI STRP: ORAL_STRIP | Status: DC

## 2013-06-18 MED ORDER — SERTRALINE HCL 100 MG PO TABS: 150.0000 mg | ORAL_TABLET | Freq: Every day | ORAL | Status: DC

## 2013-06-18 MED ORDER — QUETIAPINE FUMARATE ER 150 MG PO TB24: 150.0000 mg | ORAL_TABLET | Freq: Every day | ORAL | Status: DC

## 2013-06-18 MED ORDER — POTASSIUM CHLORIDE CRYS ER 10 MEQ PO TBCR: 10.0000 meq | EXTENDED_RELEASE_TABLET | Freq: Every day | ORAL | Status: DC

## 2013-06-18 MED ORDER — METFORMIN HCL 500 MG PO TABS: 500.0000 mg | ORAL_TABLET | Freq: Every evening | ORAL | Status: DC

## 2013-06-18 NOTE — Progress Notes (Signed)
Patient ID: [id number] [name], female   DOB: 1966/02/05, 47 y.o.   MRN: [id number] SUBJECTIVE: CC: Chief Complaint  Patient presents with  . Follow-up    3 month fasting     HPI: Patient is here for follow up of Diabetes Mellitus/obesity/OSA/vit B12 deficiency/ chronic  Respiratory failure: Symptoms: Denies Nocturia ,Denies Urinary Frequency , denies Blurred vision ,deniesDizziness,denies.Dysuria,denies paresthesias, denies extremity pain or ulcers.Marland Kitchendenies chest pain. has had an annual eye exam. do check the feet. Does check CBGs. Average CBG:116-122 Denies episodes of hypoglycemia. Does have an emergency hypoglycemic plan. admits toCompliance with medications. Denies Problems with medications.  Breakfast: scrambled egg and toast; some mornings oatmeal Lunch: mix chicken and salad dressing and  A salad  Supper : potatoes, pintos, chicken or tuna.  Exercise: trying to do chair exercises 4-5 times a  Day.  Having a flare up of allergies. Using Zyrtec and  Nose  Spray.  Past Medical History  Diagnosis Date  . Unspecified hypothyroidism   . Constipation, chronic   . Vitamin B 12 deficiency   . Barrett's esophagus   . Osteoarthritis   . Gastroparesis   . Allergic rhinitis   . Chronic respiratory failure   . Migraine headache   . Hypertension   . Diabetes mellitus without complication   . GERD (gastroesophageal reflux disease)   . OSA (obstructive sleep apnea)     uses a cpap  . Sleep apnea     has c-pap   Past Surgical History  Procedure Laterality Date  . Cholecystectomy  2005  . Appendectomy  1978  . Tonsillectomy  at age 79  . Shoulder arthroscopy  7/12    left-dsc  . Trigger finger release  12/20/2012    Procedure: RELEASE TRIGGER FINGER/A-1 PULLEY;  Surgeon: Tami Ribas, MD;  Location: [facility];  Service: Orthopedics;  Laterality: Left;  LEFT TRIGGER THUMB RELEASE   History   Social History  . Marital Status: Single    Spouse Name: N/A   Number of Children: N  . Years of Education: N/A   Occupational History  . disabled    Social History Main Topics  . Smoking status: Never Smoker   . Smokeless tobacco: Not on file  . Alcohol Use: No  . Drug Use: No  . Sexually Active: Not on file   Other Topics Concern  . Not on file   Social History Narrative  . No narrative on file   Family History  Problem Relation Age of Onset  . Asthma Father   . Allergies Father   . Heart disease Father     enlarged heart  . Peripheral vascular disease Father   . Asthma Sister   . Cancer Sister     colon at 44 yr old.  . Allergies Mother   . Allergies Brother   . Allergies Sister   . Diabetes Sister   . Colon cancer Sister    Current Outpatient Prescriptions on File Prior to Visit  Medication Sig Dispense Refill  . calcium-vitamin D (OSCAL WITH D) 500-200 MG-UNIT per tablet Take 1 tablet by mouth daily.        . cyanocobalamin (,VITAMIN B-12,) 1000 MCG/ML injection Inject 1,000 mcg into the muscle every 30 (thirty) days.      Marland Kitchen etodolac (LODINE) 500 MG tablet Take 500 mg by mouth 2 (two) times daily.        . fenofibrate micronized (ANTARA) 130 MG capsule TAKE 1 CAPSULE AT BEDTIME  30 capsule  4  . ferrous gluconate (FERGON) 325 MG tablet Take 325 mg by mouth daily with breakfast.        . furosemide (LASIX) 20 MG tablet Take 20 mg by mouth daily.        Marland Kitchen levothyroxine (SYNTHROID, LEVOTHROID) 150 MCG tablet Take 1 tablet (150 mcg total) by mouth daily.  30 tablet  9  . loratadine (CLARITIN) 10 MG tablet Take 10 mg by mouth daily. Prn        . lubiprostone (AMITIZA) 24 MCG capsule Take 24 mcg by mouth 2 (two) times daily with a meal.        . MEDROXYPROGESTERONE ACETATE PO Take by mouth. Take 1 tab daily for 12 days per month every other month       . metFORMIN (GLUCOPHAGE) 500 MG tablet Take 1 tablet (500 mg total) by mouth every evening.  30 tablet  0  . Multiple Vitamin (MULTIVITAMIN) capsule Take 1 capsule by mouth daily.         Marland Kitchen nystatin (MYCOSTATIN) powder Apply topically 2 (two) times daily as needed.        Marland Kitchen omeprazole (PRILOSEC) 40 MG capsule TAKE (1) CAPSULE DAILY  30 capsule  4  . oxyCODONE-acetaminophen (PERCOCET) 5-325 MG per tablet 1-2 tabs po q6 hours prn pain  30 tablet  0  . potassium chloride (K-DUR) 10 MEQ tablet       . potassium chloride (K-DUR,KLOR-CON) 10 MEQ tablet Take 10 mEq by mouth daily.        . Probiotic Product (HEALTHY COLON PO) Take 1 capsule by mouth daily.      . pseudoephedrine (SUDAFED) 30 MG tablet Take 30 mg by mouth every 4 (four) hours as needed.        Marland Kitchen QUEtiapine Fumarate (SEROQUEL XR) 150 MG TB24 Take by mouth. 1 po q day       . sertraline (ZOLOFT) 100 MG tablet Take 1 and 1/2 tab daily      . traMADol (ULTRAM) 50 MG tablet Take 50 mg by mouth 2 (two) times daily.       . valsartan-hydrochlorothiazide (DIOVAN-HCT) 160-25 MG per tablet Take 1 tablet by mouth daily.         Current Facility-Administered Medications on File Prior to Visit  Medication Dose Route Frequency Provider Last Rate Last Dose  . cyanocobalamin ((VITAMIN B-12)) injection 1,000 mcg  1,000 mcg Intramuscular Q30 days Ernestina Penna, MD   1,000 mcg at 06/13/13 0849   Allergies  Allergen Reactions  . Codeine     REACTION: increased BP  . Ketoconazole     REACTION: hives, swelling  . Statins     REACTION: leg crapms    There is no immunization history on file for this patient. Prior to Admission medications   Medication Sig Start Date End Date Taking? Authorizing Provider  calcium-vitamin D (OSCAL WITH D) 500-200 MG-UNIT per tablet Take 1 tablet by mouth daily.      Historical Provider, MD  cyanocobalamin (,VITAMIN B-12,) 1000 MCG/ML injection Inject 1,000 mcg into the muscle every 30 (thirty) days.    Historical Provider, MD  etodolac (LODINE) 500 MG tablet Take 500 mg by mouth 2 (two) times daily.      Historical Provider, MD  fenofibrate micronized (ANTARA) 130 MG capsule TAKE 1 CAPSULE AT  BEDTIME 04/11/13   Ileana Ladd, MD  ferrous gluconate (FERGON) 325 MG tablet Take 325 mg by mouth daily with breakfast.  Historical Provider, MD  fluticasone Aleda Grana) 50 MCG/ACT nasal spray  06/12/13   Historical Provider, MD  furosemide (LASIX) 20 MG tablet Take 20 mg by mouth daily.      Historical Provider, MD  levothyroxine (SYNTHROID, LEVOTHROID) 150 MCG tablet Take 1 tablet (150 mcg total) by mouth daily. 06/17/13   Coralie Keens, FNP  loratadine (CLARITIN) 10 MG tablet Take 10 mg by mouth daily. Prn      Historical Provider, MD  lubiprostone (AMITIZA) 24 MCG capsule Take 24 mcg by mouth 2 (two) times daily with a meal.      Historical Provider, MD  MEDROXYPROGESTERONE ACETATE PO Take by mouth. Take 1 tab daily for 12 days per month every other month     Historical Provider, MD  metFORMIN (GLUCOPHAGE) 500 MG tablet Take 1 tablet (500 mg total) by mouth every evening. 06/17/13   Coralie Keens, FNP  Multiple Vitamin (MULTIVITAMIN) capsule Take 1 capsule by mouth daily.      Historical Provider, MD  nystatin (MYCOSTATIN) powder Apply topically 2 (two) times daily as needed.      Historical Provider, MD  omeprazole (PRILOSEC) 40 MG capsule TAKE (1) CAPSULE DAILY 05/08/13   Ileana Ladd, MD  oxyCODONE-acetaminophen [facility]) 5-325 MG per tablet 1-2 tabs po q6 hours prn pain 12/20/12   Tami Ribas, MD  potassium chloride (K-DUR) 10 MEQ tablet  02/11/13   Historical Provider, MD  potassium chloride (K-DUR,KLOR-CON) 10 MEQ tablet Take 10 mEq by mouth daily.      Historical Provider, MD  Probiotic Product (HEALTHY COLON PO) Take 1 capsule by mouth daily.    Historical Provider, MD  pseudoephedrine (SUDAFED) 30 MG tablet Take 30 mg by mouth every 4 (four) hours as needed.      Historical Provider, MD  QUEtiapine Fumarate (SEROQUEL XR) 150 MG TB24 Take by mouth. 1 po q day     Historical Provider, MD  sertraline (ZOLOFT) 100 MG tablet Take 1 and 1/2 tab daily    Historical Provider, MD  traMADol  (ULTRAM) 50 MG tablet Take 50 mg by mouth 2 (two) times daily.  02/27/13   Historical Provider, MD  valsartan-hydrochlorothiazide (DIOVAN-HCT) 160-25 MG per tablet Take 1 tablet by mouth daily.      Historical Provider, MD     ROS: As above in the HPI. All other systems are stable or negative.  OBJECTIVE: APPEARANCE:  Patient in no acute distress.The patient appeared well nourished and normally developed. Acyanotic. Waist: VITAL SIGNS:BP 125/76  Pulse 60  Temp(Src) 97.4 F (36.3 C) (Oral)  Ht 5\' 7"  (1.702 m)  Wt 316 lb 12.8 oz (143.7 kg)  BMI 49.61 kg/m2  SpO2 98% WF Morbidly obese.  SKIN: warm and  Dry without overt rashes, tattoos and scars  HEAD and Neck: without JVD, Head and scalp: normal Eyes:No scleral icterus. Fundi normal, eye movements normal. Ears: Auricle normal, canal normal, Tympanic membranes normal, insufflation normal. Nose: normal Throat: normal Neck & thyroid: normal  CHEST & LUNGS: Chest wall: normal Lungs: Clear  CVS: Reveals the PMI to be normally located. Regular rhythm, First and Second Heart sounds are normal,  absence of murmurs, rubs or gallops. Peripheral vasculature: Radial pulses: normal Dorsal pedis pulses: normal Posterior pulses: normal  ABDOMEN:  Appearance:morbidly Obese Benign, no organomegaly, no masses, no Abdominal Aortic enlargement. No Guarding , no rebound. No Bruits. Bowel sounds: normal  RECTAL: N/A GU: N/A  EXTREMETIES: nonedematous.Pedal pulses are reduced  MUSCULOSKELETAL:  Spine:  normal Joints: intact  NEUROLOGIC: oriented to time,place and person; nonfocal. Strength is normal Sensory is normal Reflexes are normal Cranial Nerves are normal.  ASSESSMENT: DM (diabetes mellitus) - Plan: POCT glycosylated hemoglobin (Hb A1C), POCT UA - Microalbumin, Microalbumin, urine, metFORMIN (GLUCOPHAGE) 500 MG tablet, glucose blood (ONE TOUCH ULTRA TEST) test strip  HLD (hyperlipidemia) - Plan: COMPLETE METABOLIC  PANEL WITH GFR, NMR Lipoprofile with Lipids  B12 deficiency - Plan: Vitamin B12  Bipolar disorder, unspecified - Plan: QUEtiapine Fumarate (SEROQUEL XR) 150 MG 24 hr tablet, sertraline (ZOLOFT) 100 MG tablet  Hypokalemia - Plan: potassium chloride (K-DUR,KLOR-CON) 10 MEQ tablet   PLAN: Agree with flonase and  Zyrtec, and this  Should be continued. Orders Placed This Encounter  Procedures  . COMPLETE METABOLIC PANEL WITH GFR  . NMR Lipoprofile with Lipids  . Vitamin B12  . Microalbumin, urine  . POCT glycosylated hemoglobin (Hb A1C)  . POCT UA - Microalbumin   Meds ordered this encounter  Medications  . fluticasone (FLONASE) 50 MCG/ACT nasal spray    Sig: Place 1 spray into the nose 2 (two) times daily.   Marland Kitchen DISCONTD: glucose blood (ONE TOUCH ULTRA TEST) test strip    Sig: 1 each by Other route daily as needed for other. Use as instructed  . metFORMIN (GLUCOPHAGE) 500 MG tablet    Sig: Take 1 tablet (500 mg total) by mouth every evening.    Dispense:  30 tablet    Refill:  11  . QUEtiapine Fumarate (SEROQUEL XR) 150 MG 24 hr tablet    Sig: Take 1 tablet (150 mg total) by mouth at bedtime. 1 po q day    Dispense:  30 tablet    Refill:  5  . sertraline (ZOLOFT) 100 MG tablet    Sig: Take 1.5 tablets (150 mg total) by mouth daily. Take 1 and 1/2 tab daily    Dispense:  45 tablet    Refill:  11  . potassium chloride (K-DUR,KLOR-CON) 10 MEQ tablet    Sig: Take 1 tablet (10 mEq total) by mouth daily.    Dispense:  30 tablet    Refill:  11  . glucose blood (ONE TOUCH ULTRA TEST) test strip    Sig: Use as instructed    Dispense:  100 each    Refill:  11   Recommended to continue with the exercises in a chair and reading the book "Eat to LIVE"      Dr Woodroe Mode Recommendations  Diet and Exercise discussed with patient.  For nutrition information, I recommend books:  1).Eat to Live by Dr Monico Hoar. 2).Prevent and Reverse Heart Disease by Dr Suzzette Righter. 3)  Dr Katherina Right Book:  Program to Reverse Diabetes  Exercise recommendations are:  If unable to walk, then the patient can exercise in a chair 3 times a day. By flapping arms like a bird gently and raising legs outwards to the front.  If ambulatory, the patient can go for walks for 30 minutes 3 times a week. Then increase the intensity and duration as tolerated.  Goal is to try to attain exercise frequency to 5 times a week.  If applicable: Best to perform resistance exercises (machines or weights) 2 days a week and cardio type exercises 3 days per week.   Return in about 3 months (around 09/18/2013) for Recheck medical problems.  Wilena Tyndall P. Modesto Charon, M.D.

## 2013-06-18 NOTE — Patient Instructions (Addendum)
      Dr Woodroe Mode Recommendations  Diet and Exercise discussed with patient.  For nutrition information, I recommend books:  1).Eat to Live by Dr Monico Hoar. 2).Prevent and Reverse Heart Disease by Dr Suzzette Righter. 3) Dr Katherina Right Book:  Program to Reverse Diabetes  Exercise recommendations are:  If unable to walk, then the patient can exercise in a chair 3 times a day. By flapping arms like a bird gently and raising legs outwards to the front.  If ambulatory, the patient can go for walks for 30 minutes 3 times a week. Then increase the intensity and duration as tolerated.  Goal is to try to attain exercise frequency to 5 times a week.  If applicable: Best to perform resistance exercises (machines or weights) 2 days a week and cardio type exercises 3 days per week.  Continue doing the good work with slow but persistent weight loss by changing your  Diet and  Exercising in a  Chair.

## 2013-06-19 LAB — NMR LIPOPROFILE WITH LIPIDS
Cholesterol, Total: 225 mg/dL — ABNORMAL HIGH (ref <200)
HDL Particle Number: 28.2 umol/L — ABNORMAL LOW (ref >=30.5)
HDL Size: 8.4 nm — ABNORMAL LOW (ref >=9.2)
HDL-C: 33 mg/dL — ABNORMAL LOW (ref >=40)
LDL (calc): 129 mg/dL — ABNORMAL HIGH (ref <100)
LDL Particle Number: 2565 nmol/L — ABNORMAL HIGH (ref <1000)
LDL Size: 19.6 nm — ABNORMAL LOW (ref >20.5)
LP-IR Score: 91 — ABNORMAL HIGH (ref <=45)
Large HDL-P: <1.3 umol/L — ABNORMAL LOW (ref >=4.8)
Large VLDL-P: 19.1 nmol/L — ABNORMAL HIGH (ref <=2.7)
Small LDL Particle Number: 2041 nmol/L — ABNORMAL HIGH (ref <=527)
Triglycerides: 314 mg/dL — ABNORMAL HIGH (ref <150)
VLDL Size: 56.6 nm — ABNORMAL HIGH (ref <=46.6)

## 2013-06-23 ENCOUNTER — Other Ambulatory Visit: Payer: Self-pay | Admitting: Family Medicine

## 2013-06-23 MED ORDER — EZETIMIBE 10 MG PO TABS: 10.0000 mg | ORAL_TABLET | Freq: Every day | ORAL | Status: DC

## 2013-07-15 ENCOUNTER — Encounter: Payer: Self-pay | Admitting: *Deleted

## 2013-07-16 ENCOUNTER — Ambulatory Visit (INDEPENDENT_AMBULATORY_CARE_PROVIDER_SITE_OTHER): Payer: Medicare Other | Admitting: *Deleted

## 2013-07-16 DIAGNOSIS — E538 Deficiency of other specified B group vitamins: Secondary | ICD-10-CM

## 2013-07-16 NOTE — Patient Instructions (Signed)
Vitamin B12 Injections Every person needs vitamin B12. A deficiency develops when the body does not get enough of it. One way to overcome this is by getting B12 shots (injections). A B12 shot puts the vitamin directly into muscle tissue. This avoids any problems your body might have in absorbing it from food or a pill. In some people, the body has trouble using the vitamin correctly. This can cause a B12 deficiency. Not consuming enough of the vitamin can also cause a deficiency. Getting enough vitamin B12 can be hard for elderly people. Sometimes, they do not eat a well-balanced diet. The elderly are also more likely than younger people to have medical conditions or take medications that can lead to a deficiency. WHAT DOES VITAMIN B12 DO? Vitamin B12 does many things to help the body work right:  It helps the body make healthy red blood cells.  It helps maintain nerve cells.  It is involved in the body's process of converting food into energy (metabolism).  It is needed to make the genetic material in all cells (DNA). VITAMIN B12 FOOD SOURCES Most people get plenty of vitamin B12 through the foods they eat. It is present in:  Meat, fish, poultry, and eggs.  Milk and milk products.  It also is added when certain foods are made, including some breads, cereals and yogurts. The food is then called "fortified". CAUSES The most common causes of vitamin B12 deficiency are:  Pernicious anemia. The condition develops when the body cannot make enough healthy red blood cells. This stems from a lack of a protein made in the stomach (intrinsic factor). People without this protein cannot absorb enough vitamin B12 from food.  Malabsorption. This is when the body cannot absorb the vitamin. It can be caused by:  Pernicious anemia.  Surgery to remove part or all of the stomach can lead to malabsorption. Removal of part or all of the small intestine can also cause malabsorption.  Vegetarian diet.  People who are strict about not eating foods from animals could have trouble taking in enough vitamin B12 from diet alone.  Medications. Some medicines have been linked to B12 deficiency, such as Metformin (a drug prescribed for type 2 diabetes). Long-term use of stomach acid suppressants also can keep the vitamin from being absorbed.  Intestinal problems such as inflammatory bowel disease. If there are problems in the digestive tract, vitamin B12 may not be absorbed in good enough amounts. SYMPTOMS People who do not get enough B12 can develop problems. These can include:  Anemia. This is when the body has too few red blood cells. Red blood cells carry oxygen to the rest of the body. Without a healthy supply of red blood cells, people can feel:  Tired (fatigued).  Weak.  Severe anemia can cause:  Shortness of breath.  Dizziness.  Rapid heart rate.  Paleness.  Other Vitamin B12 deficiency symptoms include:  Diarrhea.  Numbness or tingling in the hands or feet.  Loss of appetite.  Confusion.  Sores on the tongue or in the mouth. LET YOUR CAREGIVER KNOW ABOUT:  Any allergies. It is very important to know if you are allergic or sensitive to cobalt. Vitamin B12 contains cobalt.  Any history of kidney disease.  All medications you are taking. Include prescription and over-the-counter medicines, herbs and creams.  Whether you are pregnant or breast-feeding.  If you have Leber's disease, a hereditary eye condition, vitamin B12 could make it worse. RISKS AND COMPLICATIONS Reactions to an injection are   usually temporary. They might include:  Pain at the injection site.  Redness, swelling or tenderness at the site.  Headache, dizziness or weakness.  Nausea, upset stomach or diarrhea.  Numbness or tingling.  Fever.  Joint pain.  Itching or rash. If a reaction does not go away in a short while, talk with your healthcare provider. A change in the way the shots are  given, or where they are given, might need to be made. BEFORE AN INJECTION To decide whether B12 injections are right for you, your healthcare provider will probably:  Ask about your medical history.  Ask questions about your diet.  Ask about symptoms such as:  Have you felt weak?  Do you feel unusually tired?  Do you get dizzy?  Order blood tests. These may include a test to:  Check the level of red cells in your blood.  Measure B12 levels.  Check for the presence of intrinsic factor. VITAMIN B12 INJECTIONS How often you will need a vitamin B12 injection will depend on how severe your deficiency is. This also will affect how long you will need to get them. People with pernicious anemia usually get injections for their entire life. Others might get them for a shorter period. For many people, injections are given daily or weekly for several weeks. Then, once B12 levels are normal, injections are given just once a month. If the cause of the deficiency can be fixed, the injections can be stopped. Talk with your healthcare provider about what you should expect. For an injection:  The injection site will be cleaned with an alcohol swab.  Your healthcare provider will insert a needle directly into a muscle. Most any muscle can be used. Most often, an arm muscle is used. A buttocks muscle can also be used. Many people say shots in that area are less painful.  A small adhesive bandage may be put over the injection site. It usually can be taken off in an hour or less. Injections can be given by your healthcare provider. In some cases, family members give them. Sometimes, people give them to themselves. Talk with your healthcare provider about what would be best for you. If someone other than your healthcare provider will be giving the shots, the person will need to be trained to give them correctly. HOME CARE INSTRUCTIONS   You can remove the adhesive bandage within an hour of getting a  shot.  You should be able to go about your normal activities right away.  Avoid drinking large amounts of alcohol while taking vitamin B12 shots. Alcohol can interfere with the body's use of the vitamin. SEEK MEDICAL CARE IF:   Pain, redness, swelling or tenderness at the injection site does not get better or gets worse.  Headache, dizziness or weakness does not go away.  You develop a fever of more than 100.5 F (38.1 C). SEEK IMMEDIATE MEDICAL CARE IF:   You have chest pain.  You develop shortness of breath.  You have muscle weakness that gets worse.  You develop numbness, weakness or tingling on one side or one area of the body.  You have symptoms of an allergic reaction, such as:  Hives.  Difficulty breathing.  Swelling of the lips, face, tongue or throat.  You develop a fever of more than 102.0 F (38.9 C). MAKE SURE YOU:   Understand these instructions.  Will watch your condition.  Will get help right away if you are not doing well or get worse. Document   Released: 02/24/2009 Document Revised: 02/20/2012 Document Reviewed: 02/24/2009 [employer] Patient Information 2014 [employer].  

## 2013-07-16 NOTE — Progress Notes (Signed)
Patient tolerated well.

## 2013-07-30 ENCOUNTER — Encounter: Payer: Self-pay | Admitting: Pulmonary Disease

## 2013-07-30 ENCOUNTER — Ambulatory Visit (INDEPENDENT_AMBULATORY_CARE_PROVIDER_SITE_OTHER): Payer: Medicare Other | Admitting: Pulmonary Disease

## 2013-07-30 VITALS — BP 126/80 | HR 61 | Temp 97.0°F | Ht 65.0 in | Wt 316.6 lb

## 2013-07-30 DIAGNOSIS — G4733 Obstructive sleep apnea (adult) (pediatric): Secondary | ICD-10-CM

## 2013-07-30 NOTE — Progress Notes (Signed)
  Subjective:    Patient ID: Morgan [name], female    DOB: 1965-12-19, 47 y.o.   MRN: [id number]  HPI The pt comes in today for f/u of her OSA.  She is wearing cpap compliantly, and keeping up with mask changes and supplies.  She feels she is sleeping well, and is satisfied with her daytime alertness.   No issues with pressure or mask fit.  She has lost weight since her last visit, and is continuing to work on this.    Review of Systems  Constitutional: Negative for fever and unexpected weight change.  HENT: Positive for congestion. Negative for ear pain, nosebleeds, sore throat, rhinorrhea, sneezing, trouble swallowing, dental problem, postnasal drip and sinus pressure.   Eyes: Negative for redness and itching.  Respiratory: Negative for cough, chest tightness, shortness of breath and wheezing.   Cardiovascular: Negative for palpitations and leg swelling.  Gastrointestinal: Negative for nausea and vomiting.  Genitourinary: Negative for dysuria.  Musculoskeletal: Negative for joint swelling.  Skin: Negative for rash.  Neurological: Negative for headaches.  Hematological: Does not bruise/bleed easily.  Psychiatric/Behavioral: Negative for dysphoric mood. The patient is not nervous/anxious.        Objective:   Physical Exam Obese female in nad Nose without purulence or discharge noted.  Minimal crusting. No skin breakdown or pressure necrosis from cpap mask. Neck without LN or TMG LE with mild edema, no cyanosis Alert and oriented, moves all 4.        Assessment & Plan:

## 2013-07-30 NOTE — Patient Instructions (Addendum)
Continue with cpap, and keep up with mask changes and supplies. Keep working on weight loss followup with me in one year, but call if having issues.

## 2013-07-30 NOTE — Assessment & Plan Note (Signed)
The pt is doing well from a sleep apnea standpoint while on cpap.  She is losing weight, although slowly.  I have asked her to continue with cpap, and keep up with supplies and mask changes.   She will see me back in one year  If doing well.

## 2013-08-16 ENCOUNTER — Ambulatory Visit (INDEPENDENT_AMBULATORY_CARE_PROVIDER_SITE_OTHER): Payer: Medicare Other | Admitting: *Deleted

## 2013-08-16 DIAGNOSIS — E538 Deficiency of other specified B group vitamins: Secondary | ICD-10-CM

## 2013-08-29 ENCOUNTER — Other Ambulatory Visit: Payer: Self-pay

## 2013-08-29 DIAGNOSIS — E119 Type 2 diabetes mellitus without complications: Secondary | ICD-10-CM

## 2013-08-29 MED ORDER — GLUCOSE BLOOD VI STRP: ORAL_STRIP | Status: DC

## 2013-09-09 ENCOUNTER — Telehealth: Payer: Self-pay | Admitting: Family Medicine

## 2013-09-09 NOTE — Telephone Encounter (Signed)
Pt.notified

## 2013-09-09 NOTE — Telephone Encounter (Signed)
Appointment with Morgan [name] or Morgan [name].

## 2013-09-12 ENCOUNTER — Other Ambulatory Visit: Payer: Self-pay | Admitting: Family Medicine

## 2013-09-18 ENCOUNTER — Ambulatory Visit (INDEPENDENT_AMBULATORY_CARE_PROVIDER_SITE_OTHER): Payer: Medicare Other | Admitting: Family Medicine

## 2013-09-18 DIAGNOSIS — E538 Deficiency of other specified B group vitamins: Secondary | ICD-10-CM

## 2013-10-01 ENCOUNTER — Telehealth: Payer: Self-pay | Admitting: Family Medicine

## 2013-10-01 NOTE — Telephone Encounter (Signed)
Can hold for Dr. Modesto Charon

## 2013-10-01 NOTE — Telephone Encounter (Signed)
Pt notified and states she can come in for labs cause "they don't cost anything" but does not want an ov.

## 2013-10-01 NOTE — Telephone Encounter (Signed)
CLARIFICATION ON MESSAGE : PT DOES NOT WANT AN OFFICE VISIT DUE TO HER FIANCES BUT SHE SAYS LABS WILL BE PAID FOR AND WOULD RATHER DO LABS ONLY IF OK WITH DR Modesto Charon.

## 2013-10-03 ENCOUNTER — Ambulatory Visit: Payer: Medicare Other | Admitting: Family Medicine

## 2013-10-03 NOTE — Telephone Encounter (Signed)
Spoke with pt regarding an "addenum " from Dr Modesto Charon and call transferred to insurance regarding possible payment plan . Pt verbalizes understanding

## 2013-10-07 ENCOUNTER — Encounter: Payer: Self-pay | Admitting: Family Medicine

## 2013-10-07 ENCOUNTER — Ambulatory Visit (INDEPENDENT_AMBULATORY_CARE_PROVIDER_SITE_OTHER): Payer: Medicare Other | Admitting: Family Medicine

## 2013-10-07 VITALS — BP 141/81 | HR 69 | Temp 97.0°F | Ht 67.0 in | Wt 315.4 lb

## 2013-10-07 DIAGNOSIS — K2270 Barrett's esophagus without dysplasia: Secondary | ICD-10-CM

## 2013-10-07 DIAGNOSIS — F319 Bipolar disorder, unspecified: Secondary | ICD-10-CM

## 2013-10-07 DIAGNOSIS — E039 Hypothyroidism, unspecified: Secondary | ICD-10-CM

## 2013-10-07 DIAGNOSIS — G43909 Migraine, unspecified, not intractable, without status migrainosus: Secondary | ICD-10-CM

## 2013-10-07 DIAGNOSIS — M1990 Unspecified osteoarthritis, unspecified site: Secondary | ICD-10-CM

## 2013-10-07 DIAGNOSIS — E785 Hyperlipidemia, unspecified: Secondary | ICD-10-CM

## 2013-10-07 DIAGNOSIS — K3184 Gastroparesis: Secondary | ICD-10-CM

## 2013-10-07 DIAGNOSIS — I10 Essential (primary) hypertension: Secondary | ICD-10-CM

## 2013-10-07 DIAGNOSIS — E119 Type 2 diabetes mellitus without complications: Secondary | ICD-10-CM

## 2013-10-07 DIAGNOSIS — K5909 Other constipation: Secondary | ICD-10-CM

## 2013-10-07 DIAGNOSIS — E538 Deficiency of other specified B group vitamins: Secondary | ICD-10-CM

## 2013-10-07 DIAGNOSIS — G4733 Obstructive sleep apnea (adult) (pediatric): Secondary | ICD-10-CM

## 2013-10-07 DIAGNOSIS — D649 Anemia, unspecified: Secondary | ICD-10-CM

## 2013-10-07 DIAGNOSIS — J309 Allergic rhinitis, unspecified: Secondary | ICD-10-CM

## 2013-10-07 DIAGNOSIS — R300 Dysuria: Secondary | ICD-10-CM

## 2013-10-07 DIAGNOSIS — K5900 Constipation, unspecified: Secondary | ICD-10-CM

## 2013-10-07 LAB — POCT URINALYSIS DIPSTICK
Bilirubin, UA: NEGATIVE
Blood, UA: NEGATIVE
Glucose, UA: NEGATIVE
Ketones, UA: NEGATIVE
Leukocytes, UA: NEGATIVE
Nitrite, UA: NEGATIVE
Protein, UA: NEGATIVE
Spec Grav, UA: 1.02
Urobilinogen, UA: NEGATIVE
pH, UA: 6.5

## 2013-10-07 LAB — POCT UA - MICROSCOPIC ONLY
Bacteria, U Microscopic: NEGATIVE
Casts, Ur, LPF, POC: NEGATIVE
Crystals, Ur, HPF, POC: NEGATIVE
Mucus, UA: NEGATIVE
RBC, urine, microscopic: NEGATIVE
WBC, Ur, HPF, POC: NEGATIVE
Yeast, UA: NEGATIVE

## 2013-10-07 LAB — POCT CBC
Granulocyte percent: 68.5 %G (ref 37–80)
HCT, POC: 40.9 % (ref 37.7–47.9)
Hemoglobin: 13.1 g/dL (ref 12.2–16.2)
Lymph, poc: 1.8 (ref 0.6–3.4)
MCH, POC: 27.4 pg (ref 27–31.2)
MCHC: 32.2 g/dL (ref 31.8–35.4)
MCV: 85.2 fL (ref 80–97)
MPV: 8.5 fL (ref 0–99.8)
POC Granulocyte: 4.2 (ref 2–6.9)
POC LYMPH PERCENT: 28.4 %L (ref 10–50)
Platelet Count, POC: 254 10*3/uL (ref 142–424)
RBC: 4.8 M/uL (ref 4.04–5.48)
RDW, POC: 13.6 %
WBC: 6.2 10*3/uL (ref 4.6–10.2)

## 2013-10-07 LAB — POCT GLYCOSYLATED HEMOGLOBIN (HGB A1C): Hemoglobin A1C: 6.2

## 2013-10-07 MED ORDER — QUETIAPINE FUMARATE ER 150 MG PO TB24: 150.0000 mg | ORAL_TABLET | Freq: Every day | ORAL | Status: DC

## 2013-10-07 MED ORDER — VALSARTAN-HYDROCHLOROTHIAZIDE 160-25 MG PO TABS: ORAL_TABLET | ORAL | Status: DC

## 2013-10-07 MED ORDER — FUROSEMIDE 20 MG PO TABS: 20.0000 mg | ORAL_TABLET | Freq: Every day | ORAL | Status: DC

## 2013-10-07 MED ORDER — FENOFIBRATE MICRONIZED 130 MG PO CAPS: ORAL_CAPSULE | ORAL | Status: DC

## 2013-10-07 MED ORDER — OMEPRAZOLE 40 MG PO CPDR: DELAYED_RELEASE_CAPSULE | ORAL | Status: DC

## 2013-10-07 NOTE — Progress Notes (Signed)
Patient ID: Morgan [name], female   DOB: May 03, 1966, 47 y.o.   MRN: [id number] SUBJECTIVE: CC: Chief Complaint  Patient presents with  . Follow-up    3 month follow up diabetes  . Medication Refill    prilosec, diovan,lasix antara seroquel     HPI: Patient is here for follow up of Diabetes Mellitus/obesity/hypothyrois/hyperlipidemia: Symptoms evaluated: Denies Nocturia ,Denies Urinary Frequency , denies Blurred vision ,deniesDizziness,denies.Dysuria,denies paresthesias, denies extremity pain or ulcers.Marland Kitchendenies chest pain. has had an annual eye exam. do check the feet. Does check CBGs. Average CBG:94-136 Denies episodes of hypoglycemia. Does have an emergency hypoglycemic plan. admits toCompliance with medications.   Problems with medications. Zocor caused swelling. The other statins caused bad leg cramps. And her dad had worsening DM with a statin. She has tried everyone of the statin. Has appointment with Tammy to review medications and  Diet as well. Couldn't tolerate Zetia due to cramps.  Breakfast: scrambled eggs or boiled eggs, and oatmeal. Lunch: baked chicken or tuna and salad dressing. Pinto beans. Dinner: baked  Deer hamburger or baked chicken, with a piece of bread and sweet potatoes and beans.  Doesn't eat enough vegetables.  Exercise: walks  Some. 30 to 45 minutes daily. Has arthritis that limits her activities. Has gastroparesis that limits dietary changes.  Drinks: sweet tea, coolaid.  Past Medical History  Diagnosis Date  . Unspecified hypothyroidism   . Constipation, chronic   . Vitamin B 12 deficiency   . Barrett's esophagus   . Osteoarthritis   . Gastroparesis   . Allergic rhinitis   . Chronic respiratory failure   . Migraine headache   . Hypertension   . Diabetes mellitus without complication   . GERD (gastroesophageal reflux disease)   . OSA (obstructive sleep apnea)     uses a cpap  . Sleep apnea     has c-pap   Past Surgical History   Procedure Laterality Date  . Cholecystectomy  2005  . Appendectomy  1978  . Tonsillectomy  at age 57  . Shoulder arthroscopy  7/12    left-dsc  . Trigger finger release  12/20/2012    Procedure: RELEASE TRIGGER FINGER/A-1 PULLEY;  Surgeon: Tami Ribas, MD;  Location: [facility];  Service: Orthopedics;  Laterality: Left;  LEFT TRIGGER THUMB RELEASE   History   Social History  . Marital Status: Single    Spouse Name: N/A    Number of Children: N  . Years of Education: N/A   Occupational History  . disabled    Social History Main Topics  . Smoking status: Never Smoker   . Smokeless tobacco: Not on file  . Alcohol Use: No  . Drug Use: No  . Sexual Activity: Not on file   Other Topics Concern  . Not on file   Social History Narrative  . No narrative on file   Family History  Problem Relation Age of Onset  . Asthma Father   . Allergies Father   . Heart disease Father     enlarged heart  . Peripheral vascular disease Father   . Asthma Sister   . Cancer Sister     colon at 48 yr old.  . Allergies Mother   . Allergies Brother   . Allergies Sister   . Diabetes Sister   . Colon cancer Sister    Current Outpatient Prescriptions on File Prior to Visit  Medication Sig Dispense Refill  . calcium-vitamin D (OSCAL WITH D) 500-200 MG-UNIT per  tablet Take 1 tablet by mouth daily.       Marland Kitchen etodolac (LODINE) 500 MG tablet Take 500 mg by mouth 2 (two) times daily.        . ferrous gluconate (FERGON) 325 MG tablet Take 325 mg by mouth daily with breakfast.        . fluticasone (FLONASE) 50 MCG/ACT nasal spray Place 1 spray into the nose 2 (two) times daily.       Marland Kitchen glucose blood (ONE TOUCH ULTRA TEST) test strip Use as instructed  100 each  0  . levothyroxine (SYNTHROID, LEVOTHROID) 150 MCG tablet Take 1 tablet (150 mcg total) by mouth daily.  30 tablet  9  . loratadine (CLARITIN) 10 MG tablet Take 10 mg by mouth daily. Prn        . MEDROXYPROGESTERONE ACETATE PO  Take by mouth. Take 1 tab daily for 12 days per month every other month       . metFORMIN (GLUCOPHAGE) 500 MG tablet Take 1 tablet (500 mg total) by mouth every evening.  30 tablet  11  . Multiple Vitamin (MULTIVITAMIN) capsule Take 1 capsule by mouth daily.        Marland Kitchen nystatin (MYCOSTATIN) powder Apply topically 2 (two) times daily as needed.        . potassium chloride (K-DUR,KLOR-CON) 10 MEQ tablet Take 1 tablet (10 mEq total) by mouth daily.  30 tablet  11  . Probiotic Product (HEALTHY COLON PO) Take 2 capsules by mouth daily.       . sertraline (ZOLOFT) 100 MG tablet Take 1.5 tablets (150 mg total) by mouth daily. Take 1 and 1/2 tab daily  45 tablet  11  . traMADol (ULTRAM) 50 MG tablet Take 50 mg by mouth 2 (two) times daily.        Current Facility-Administered Medications on File Prior to Visit  Medication Dose Route Frequency Provider Last Rate Last Dose  . cyanocobalamin ((VITAMIN B-12)) injection 1,000 mcg  1,000 mcg Intramuscular Q30 days Ernestina Penna, MD   1,000 mcg at 09/18/13 0901   Allergies  Allergen Reactions  . Codeine     REACTION: increased BP  . Ketoconazole     REACTION: hives, swelling  . Statins     REACTION: leg crapms    There is no immunization history on file for this patient. Prior to Admission medications   Medication Sig Start Date End Date Taking? Authorizing Provider  calcium-vitamin D (OSCAL WITH D) 500-200 MG-UNIT per tablet Take 1 tablet by mouth daily.     Historical Provider, MD  etodolac (LODINE) 500 MG tablet Take 500 mg by mouth 2 (two) times daily.      Historical Provider, MD  ezetimibe (ZETIA) 10 MG tablet Take 1 tablet (10 mg total) by mouth daily. 06/23/13   Ileana Ladd, MD  fenofibrate micronized (ANTARA) 130 MG capsule TAKE (1) CAPSULE DAILY AT BEDTIME. 09/12/13   Mary-Margaret Daphine Deutscher, FNP  ferrous gluconate (FERGON) 325 MG tablet Take 325 mg by mouth daily with breakfast.      Historical Provider, MD  fluticasone (FLONASE) 50 MCG/ACT  nasal spray Place 1 spray into the nose 2 (two) times daily.  06/12/13   Historical Provider, MD  furosemide (LASIX) 20 MG tablet Take 20 mg by mouth daily.      Historical Provider, MD  glucose blood (ONE TOUCH ULTRA TEST) test strip Use as instructed 08/29/13   Ernestina Penna, MD  levothyroxine (SYNTHROID, LEVOTHROID) 150  MCG tablet Take 1 tablet (150 mcg total) by mouth daily. 06/17/13   Coralie Keens, FNP  loratadine (CLARITIN) 10 MG tablet Take 10 mg by mouth daily. Prn      Historical Provider, MD  MEDROXYPROGESTERONE ACETATE PO Take by mouth. Take 1 tab daily for 12 days per month every other month     Historical Provider, MD  metFORMIN (GLUCOPHAGE) 500 MG tablet Take 1 tablet (500 mg total) by mouth every evening. 06/18/13   Ileana Ladd, MD  Multiple Vitamin (MULTIVITAMIN) capsule Take 1 capsule by mouth daily.      Historical Provider, MD  nystatin (MYCOSTATIN) powder Apply topically 2 (two) times daily as needed.      Historical Provider, MD  omeprazole (PRILOSEC) 40 MG capsule TAKE (1) CAPSULE DAILY 05/08/13   Ileana Ladd, MD  potassium chloride (K-DUR,KLOR-CON) 10 MEQ tablet Take 1 tablet (10 mEq total) by mouth daily. 06/18/13   Ileana Ladd, MD  Probiotic Product (HEALTHY COLON PO) Take 2 capsules by mouth daily.     Historical Provider, MD  QUEtiapine Fumarate (SEROQUEL XR) 150 MG 24 hr tablet Take 1 tablet (150 mg total) by mouth at bedtime. 1 po q day 06/18/13   Ileana Ladd, MD  sertraline (ZOLOFT) 100 MG tablet Take 1.5 tablets (150 mg total) by mouth daily. Take 1 and 1/2 tab daily 06/18/13   Ileana Ladd, MD  traMADol (ULTRAM) 50 MG tablet Take 50 mg by mouth 2 (two) times daily.  02/27/13   Historical Provider, MD  valsartan-hydrochlorothiazide (DIOVAN-HCT) 160-25 MG per tablet TAKE 1 TABLET DAILY 09/12/13   Ileana Ladd, MD     ROS: As above in the HPI. All other systems are stable or negative.  OBJECTIVE: APPEARANCE:  Patient in no acute distress.The patient  appeared well nourished and normally developed. Acyanotic. Waist: VITAL SIGNS:BP 141/81  Pulse 69  Temp(Src) 97 F (36.1 C) (Oral)  Ht 5\' 7"  (1.702 m)  Wt 315 lb 6.4 oz (143.065 kg)  BMI 49.39 kg/m2  LMP 09/13/2013 WF morbidly Obese Accompanied by mother who says she has the advanced symptoms and conditions that her daughter has. And therefore she and her daughter is limited from eating a healthier diet and exercising. SKIN: warm and  Dry without overt rashes, tattoos and scars  HEAD and Neck: without JVD, Head and scalp: normal Eyes:No scleral icterus. Fundi normal, eye movements normal. Ears: Auricle normal, canal normal, Tympanic membranes normal, insufflation normal. Nose: normal Throat: normal Neck & thyroid: normal  CHEST & LUNGS: Chest wall: normal Lungs: Clear  CVS: Reveals the PMI to be normally located. Regular rhythm, First and Second Heart sounds are normal,  absence of murmurs, rubs or gallops. Peripheral vasculature: Radial pulses: normal Dorsal pedis pulses: normal Posterior pulses: normal  ABDOMEN:  Appearance: morbidly obese Benign, no organomegaly, no masses, no Abdominal Aortic enlargement. No Guarding , no rebound. No Bruits. Bowel sounds: normal  RECTAL: N/A GU: N/A  EXTREMETIES: nonedematous.  MUSCULOSKELETAL:  Spine: normal Joints: intact  NEUROLOGIC: oriented to time,place and person; nonfocal. Strength is normal Sensory is normal Reflexes are normal Cranial Nerves are normal.  Psychiatric: stable. No emotional swings. No mania and not depressed. Not suicidal. Not hallucinating.  ASSESSMENT:  HYPERTENSION - Plan: CMP14+EGFR, furosemide (LASIX) 20 MG tablet, valsartan-hydrochlorothiazide (DIOVAN-HCT) 160-25 MG per tablet  Morbid obesity  OBSTRUCTIVE SLEEP APNEA  DM (diabetes mellitus) - Plan: POCT glycosylated hemoglobin (Hb A1C), CMP14+EGFR  OSTEOARTHRITIS  Unspecified hypothyroidism -  Plan: TSH  HLD (hyperlipidemia) -  Plan: CMP14+EGFR, NMR, lipoprofile, fenofibrate micronized (ANTARA) 130 MG capsule  Vitamin B 12 deficiency  MIGRAINE HEADACHE  ALLERGIC RHINITIS  Gastroparesis  BARRETTS ESOPHAGUS - Plan: omeprazole (PRILOSEC) 40 MG capsule  Constipation, chronic - Plan: omeprazole (PRILOSEC) 40 MG capsule  Bipolar disorder, unspecified - Plan: QUEtiapine Fumarate (SEROQUEL XR) 150 MG 24 hr tablet  Anemia - Plan: POCT CBC, Iron  Dysuria - Plan: POCT urinalysis dipstick, POCT UA - Microscopic Only  Many obstacles to needed lifestyle therapeutic  Changes , including environment, culture and physical limitations. Still encouraged to make small incremental changes over time will add up and make a substantial difference.  Patient was not able to continue with mental health for her Rx. Is stable and was told her PCP could refill.  PLAN:  Orders Placed This Encounter  Procedures  . CMP14+EGFR  . NMR, lipoprofile  . TSH  . Iron  . POCT glycosylated hemoglobin (Hb A1C)  . POCT CBC  . POCT urinalysis dipstick  . POCT UA - Microscopic Only   Meds ordered this encounter  Medications  . fenofibrate micronized (ANTARA) 130 MG capsule    Sig: TAKE (1) CAPSULE DAILY AT BEDTIME.    Dispense:  30 capsule    Refill:  5  . furosemide (LASIX) 20 MG tablet    Sig: Take 1 tablet (20 mg total) by mouth daily.    Dispense:  30 tablet    Refill:  5  . valsartan-hydrochlorothiazide (DIOVAN-HCT) 160-25 MG per tablet    Sig: TAKE 1 TABLET DAILY    Dispense:  30 tablet    Refill:  5  . QUEtiapine Fumarate (SEROQUEL XR) 150 MG 24 hr tablet    Sig: Take 1 tablet (150 mg total) by mouth at bedtime. 1 po q day    Dispense:  30 tablet    Refill:  5  . omeprazole (PRILOSEC) 40 MG capsule    Sig: TAKE (1) CAPSULE DAILY    Dispense:  30 capsule    Refill:  5   Medications Discontinued During This Encounter  Medication Reason  . ezetimibe (ZETIA) 10 MG tablet Side effect (s)  . fenofibrate micronized  (ANTARA) 130 MG capsule Reorder  . furosemide (LASIX) 20 MG tablet Reorder  . valsartan-hydrochlorothiazide (DIOVAN-HCT) 160-25 MG per tablet Reorder  . QUEtiapine Fumarate (SEROQUEL XR) 150 MG 24 hr tablet Reorder  . omeprazole (PRILOSEC) 40 MG capsule Reorder   DM foot Care handout in the AVS Use artificial sweetener in the tea. Prefer to have you stop Col aid and use water flavored with lemon or lime.  Try and add 3 to 4 servings of vegetables that you can tolerate daily.  Walking exercise as you can tolerate twice a day. Consider the following:       Dr Woodroe Mode Recommendations  For nutrition information, I recommend books:  1).Eat to Live by Dr Monico Hoar. 2).Prevent and Reverse Heart Disease by Dr Suzzette Righter. 3) Dr Katherina Right Book:  Program to Reverse Diabetes  Exercise recommendations are:  If unable to walk, then the patient can exercise in a chair 3 times a day. By flapping arms like a bird gently and raising legs outwards to the front.  If ambulatory, the patient can go for walks for 30 minutes 3 times a week. Then increase the intensity and duration as tolerated.  Goal is to try to attain exercise frequency to 5 times a week.  If applicable:  Best to perform resistance exercises (machines or weights) 2 days a week and cardio type exercises 3 days per week.   Return in about 4 months (around 02/07/2014) for Recheck medical problems.  Supriya Beaston P. Modesto Charon, M.D.

## 2013-10-07 NOTE — Patient Instructions (Addendum)
Use artificial sweetener in the tea. Prefer to have you stop Col aid and use water flavored with lemon or lime.  Try and add 3 to 4 servings of vegetables that you can tolerate daily.  Walking exercise as you can tolerate twice a day. Consider the following:       Dr Woodroe Mode Recommendations  For nutrition information, I recommend books:  1).Eat to Live by Dr Monico Hoar. 2).Prevent and Reverse Heart Disease by Dr Suzzette Righter. 3) Dr Katherina Right Book:  Program to Reverse Diabetes  Exercise recommendations are:  If unable to walk, then the patient can exercise in a chair 3 times a day. By flapping arms like a bird gently and raising legs outwards to the front.  If ambulatory, the patient can go for walks for 30 minutes 3 times a week. Then increase the intensity and duration as tolerated.  Goal is to try to attain exercise frequency to 5 times a week.  If applicable: Best to perform resistance exercises (machines or weights) 2 days a week and cardio type exercises 3 days per week.    Diabetes and Foot Care Diabetes may cause you to have a poor blood supply (circulation) to your legs and feet. Because of this, the skin may be thinner, break easier, and heal more slowly. You also may have nerve damage in your legs and feet causing decreased feeling. You may not notice minor injuries to your feet that could lead to serious problems or infections. Taking care of your feet is one of the most important things you can do for yourself.  HOME CARE INSTRUCTIONS  Do not go barefoot. Bare feet are easily injured.  Check your feet daily for blisters, cuts, and redness.  Wash your feet with warm water (not hot) and mild soap. Pat your feet and between your toes until completely dry.  Apply a moisturizing lotion that does not contain alcohol or petroleum jelly to the dry skin on your feet and to dry brittle toenails. Do not put it between your toes.  Trim your toenails  straight across. Do not dig under them or around the cuticle.  Do not cut corns or calluses, or try to remove them with medicine.  Wear clean cotton socks or stockings every day. Make sure they are not too tight. Do not wear knee high stockings since they may decrease blood flow to your legs.  Wear leather shoes that fit properly and have enough cushioning. To break in new shoes, wear them just a few hours a day to avoid injuring your feet.  Wear shoes at all times, even in the house.  Do not cross your legs. This may decrease the blood flow to your feet.  If you find a minor scrape, cut, or break in the skin on your feet, keep it and the skin around it clean and dry. These areas may be cleansed with mild soap and water. Do not use peroxide, alcohol, iodine or Merthiolate.  When you remove an adhesive bandage, be sure not to harm the skin around it.  If you have a wound, look at it several times a day to make sure it is healing.  Do not use heating pads or hot water bottles. Burns can occur. If you have lost feeling in your feet or legs, you may not know it is happening until it is too late.  Report any cuts, sores or bruises to your caregiver. Do not wait! SEEK MEDICAL CARE IF:  You have an injury that is not healing or you notice redness, numbness, burning, or tingling.  Your feet always feel cold.  You have pain or cramps in your legs and feet. SEEK IMMEDIATE MEDICAL CARE IF:   There is increasing redness, swelling, or increasing pain in the wound.  There is a red line that goes up your leg.  Pus is coming from a wound.  You develop an unexplained oral temperature above 102 F (38.9 C), or as your caregiver suggests.  You notice a bad smell coming from an ulcer or wound. MAKE SURE YOU:   Understand these instructions.  Will watch your condition.  Will get help right away if you are not doing well or get worse. Document Released: 11/25/2000 Document Revised:  02/20/2012 Document Reviewed: 06/03/2009 [facility] Patient Information 2014 [place], [place].

## 2013-10-08 LAB — CMP14+EGFR
ALT: 31 IU/L (ref 0–32)
AST: 24 IU/L (ref 0–40)
Albumin/Globulin Ratio: 1.9 (ref 1.1–2.5)
Albumin: 4.4 g/dL (ref 3.5–5.5)
Alkaline Phosphatase: 53 IU/L (ref 39–117)
BUN/Creatinine Ratio: 32 — ABNORMAL HIGH (ref 9–23)
BUN: 23 mg/dL (ref 6–24)
CO2: 27 mmol/L (ref 18–29)
Calcium: 9.5 mg/dL (ref 8.7–10.2)
Chloride: 99 mmol/L (ref 97–108)
Creatinine, Ser: 0.71 mg/dL (ref 0.57–1.00)
GFR calc Af Amer: 118 mL/min/{1.73_m2} (ref >59)
GFR calc non Af Amer: 102 mL/min/{1.73_m2} (ref >59)
Globulin, Total: 2.3 g/dL (ref 1.5–4.5)
Glucose: 108 mg/dL — ABNORMAL HIGH (ref 65–99)
Potassium: 4 mmol/L (ref 3.5–5.2)
Sodium: 143 mmol/L (ref 134–144)
Total Bilirubin: 0.2 mg/dL (ref 0.0–1.2)
Total Protein: 6.7 g/dL (ref 6.0–8.5)

## 2013-10-08 LAB — TSH: TSH: 4.41 u[IU]/mL (ref 0.450–4.500)

## 2013-10-08 LAB — NMR, LIPOPROFILE
Cholesterol: 244 mg/dL — ABNORMAL HIGH (ref <200)
HDL Cholesterol by NMR: 48 mg/dL (ref >=40)
HDL Particle Number: 37.9 umol/L (ref >=30.5)
LDL Particle Number: 2360 nmol/L — ABNORMAL HIGH (ref <1000)
LDL Size: 20.2 nm — ABNORMAL LOW (ref >20.5)
LDLC SERPL CALC-MCNC: 145 mg/dL — ABNORMAL HIGH (ref <100)
LP-IR Score: 86 — ABNORMAL HIGH (ref <=45)
Small LDL Particle Number: 1469 nmol/L — ABNORMAL HIGH (ref <=527)
Triglycerides by NMR: 257 mg/dL — ABNORMAL HIGH (ref <150)

## 2013-10-08 LAB — IRON: Iron: 72 ug/dL (ref 35–155)

## 2013-10-21 ENCOUNTER — Ambulatory Visit (INDEPENDENT_AMBULATORY_CARE_PROVIDER_SITE_OTHER): Payer: Medicare Other | Admitting: Pharmacist

## 2013-10-21 VITALS — BP 110/70 | HR 72 | Ht 67.0 in | Wt 319.0 lb

## 2013-10-21 DIAGNOSIS — E538 Deficiency of other specified B group vitamins: Secondary | ICD-10-CM

## 2013-10-21 DIAGNOSIS — E785 Hyperlipidemia, unspecified: Secondary | ICD-10-CM

## 2013-10-21 DIAGNOSIS — E119 Type 2 diabetes mellitus without complications: Secondary | ICD-10-CM

## 2013-10-22 NOTE — Progress Notes (Signed)
Lipid Clinic Consultation  Chief Complaint:   Chief Complaint  Patient presents with  . Hyperlipidemia     HPI: First visit to lipid clinic. Patient with hyperlipidemia.  According to patient and her sister she has tried several statins with extreme myalgias but cannot recall exact names of medications.  She also tried Zetia 10mg  but after a month she though it was causing abdominal cramping and stomach.  Patient also has type 2 DM and related gastroparesis.   Past Medical History  Diagnosis Date  . Unspecified hypothyroidism   . Constipation, chronic   . Vitamin B 12 deficiency   . Barrett's esophagus   . Osteoarthritis   . Gastroparesis   . Allergic rhinitis   . Chronic respiratory failure   . Migraine headache   . Hypertension   . Diabetes mellitus without complication   . GERD (gastroesophageal reflux disease)   . OSA (obstructive sleep apnea)     uses a cpap  . Sleep apnea     has c-pap      lipid  panel  Component Value Date/Time   LDL-P 2360 10/07/2013   LDL-C 145 10/07/2013   HDL-C 48 10/07/2013   TOTAL CHOL 244* 10/07/2013 0927   TRIG 257 10/07/2013   A1c was 6.2& (10/27/20140)   CHD/CHF Risk Equivalents:  DM Primary Problem(s):  LDL or LDL-P elevated and TG elevated  Current NCEP Goals: LDL Goal < 100 HDL Goal >/= 45 Tg Goal < 161 Non-HDL Goal < 130  Secondary cause of hyperlipidemia present:  DM and poor dietary habits Low fat diet followed?  No - eats lots of processed food such as Vienna sausages, liver pudding / mush Low carb diet followed?  No - sweet tea is regular drink Exercise?  No - limited by osteoarthritis and finances  Patient was going to [facility] and riding bike but she cannot afford membership which is $25 per month  Filed Vitals:   10/21/13 0836  BP: 110/70  Pulse: 72   Filed Weights   10/21/13 0836  Weight: 319 lb (144.697 kg)     Edema:  Trace bilaterally Respirations:  Normal, no wheezing noted   General Appearance:   alert, oriented, no acute distress and obese Mood/Affect:  normal    Recommendations: Changes in lipid medication(s):  Restart zetia 10mg  daily - patient already had rx at pharmacy but has not filled in several months.   I also discussed the benefit on statin therapy but patient unwilling to retry even 2-3 times weekly statin.  I also gave patient information about incretin mimetic / Bydureon as an alternate to metformin which might help to decrease appetite and weight loss.  Patient to take information and we will discuss again at next visit. Most of the visit was spent discussing dietary changes to decrease cholesterol - lean proteins, increase whole grains and watch serving sizes (1/2 cup), increase non starchy vegetables and fresh fruits - handout given.  Encouraged increase exercise however able - important to move more.    Recheck Lipid Panel:  2 months   Henrene Pastor, PharmD, CPP   Time spent counseling patient:  40 minutes PharmD:  Henrene Pastor, PHARMD

## 2013-10-28 ENCOUNTER — Telehealth: Payer: Self-pay | Admitting: Family Medicine

## 2013-10-28 MED ORDER — PRAVASTATIN SODIUM 20 MG PO TABS: 20.0000 mg | ORAL_TABLET | Freq: Every day | ORAL | Status: DC

## 2013-10-28 NOTE — Telephone Encounter (Signed)
Patient calls to say that she cannot tolerate Zetia.  She has been having cramps.  He brother has been able to tolerate pravastatin 20mg  daily and she would like to try.   Rx for pravastatin sent to pharmacy.

## 2013-11-11 ENCOUNTER — Telehealth: Payer: Self-pay | Admitting: Family Medicine

## 2013-11-11 ENCOUNTER — Other Ambulatory Visit: Payer: Self-pay | Admitting: Family Medicine

## 2013-11-13 NOTE — Telephone Encounter (Signed)
Talked with patient about rxn she had to pravastatin.  Will not prescribe another medication at this time as she has had side effects to all cholesterol lowering medications tried.  She is trying to eat healthier and reduce fat intake.  Continue with TLC and recheck as planned January 2014.

## 2013-11-21 ENCOUNTER — Ambulatory Visit (INDEPENDENT_AMBULATORY_CARE_PROVIDER_SITE_OTHER): Payer: Medicare Other | Admitting: *Deleted

## 2013-11-21 DIAGNOSIS — E538 Deficiency of other specified B group vitamins: Secondary | ICD-10-CM

## 2013-11-21 NOTE — Progress Notes (Signed)
Patient ID: Morgan [name], female   DOB: 18-Jun-1966, 47 y.o.   MRN: [id number] Pt tolerated inj well

## 2013-12-18 ENCOUNTER — Encounter: Payer: Medicare Other | Admitting: *Deleted

## 2013-12-18 NOTE — Progress Notes (Signed)
Patient was too early for injection

## 2013-12-23 ENCOUNTER — Ambulatory Visit (INDEPENDENT_AMBULATORY_CARE_PROVIDER_SITE_OTHER): Payer: Medicare Other | Admitting: *Deleted

## 2013-12-23 DIAGNOSIS — E538 Deficiency of other specified B group vitamins: Secondary | ICD-10-CM

## 2014-01-16 ENCOUNTER — Telehealth: Payer: Self-pay | Admitting: Pulmonary Disease

## 2014-01-16 ENCOUNTER — Telehealth: Payer: Self-pay | Admitting: Family Medicine

## 2014-01-16 NOTE — Telephone Encounter (Signed)
Pt advsied to contact PCP as well as [employer]. [place] Bing, CMA

## 2014-01-16 NOTE — Telephone Encounter (Signed)
If we have never dealt with her oxygen, and her pcp has been signing for this, would have them send to primary.

## 2014-01-16 NOTE — Telephone Encounter (Signed)
lmtcb x1 w/ family member  

## 2014-01-16 NOTE — Telephone Encounter (Signed)
I spoke with the pt and she states she changed insurance and received a letter from [employer] stating she needs to be tested for her oxygen. I advised I will call [facility] and ask exactly what is needed.  I spoke with Mariann Laster and she advised the pt needs to have an ONO done to re qualify her for oxygen due to change in insurance.  Looking through the pt chart it looks like the orders from [facility] for the pt oxygen is being singed by her PCP and orders for cpap are being singed by you. Are you ok with ordering ONO or does this need to go through PCP? [place] Bing, CMA

## 2014-01-29 ENCOUNTER — Other Ambulatory Visit: Payer: Self-pay | Admitting: Family Medicine

## 2014-01-29 NOTE — Progress Notes (Signed)
   Subjective:    Patient ID: Morgan [name], female    DOB: 09/30/1966, 48 y.o.   MRN: [id number]  HPI    Review of Systems     Objective:   Physical Exam        Assessment & Plan:  b12 injection Lysbeth Penner FNP

## 2014-02-07 ENCOUNTER — Ambulatory Visit: Payer: Medicare Other | Admitting: Family Medicine

## 2014-02-07 ENCOUNTER — Ambulatory Visit (INDEPENDENT_AMBULATORY_CARE_PROVIDER_SITE_OTHER): Payer: Medicare Other | Admitting: *Deleted

## 2014-02-07 DIAGNOSIS — E538 Deficiency of other specified B group vitamins: Secondary | ICD-10-CM

## 2014-02-07 NOTE — Progress Notes (Signed)
Patient tolerated well.

## 2014-02-20 ENCOUNTER — Ambulatory Visit (INDEPENDENT_AMBULATORY_CARE_PROVIDER_SITE_OTHER): Payer: Medicare Other | Admitting: Family Medicine

## 2014-02-20 ENCOUNTER — Encounter: Payer: Self-pay | Admitting: Family Medicine

## 2014-02-20 VITALS — BP 153/75 | HR 78 | Temp 97.6°F | Ht 65.0 in | Wt 326.8 lb

## 2014-02-20 DIAGNOSIS — E785 Hyperlipidemia, unspecified: Secondary | ICD-10-CM

## 2014-02-20 DIAGNOSIS — K2270 Barrett's esophagus without dysplasia: Secondary | ICD-10-CM

## 2014-02-20 DIAGNOSIS — R1013 Epigastric pain: Secondary | ICD-10-CM

## 2014-02-20 DIAGNOSIS — E039 Hypothyroidism, unspecified: Secondary | ICD-10-CM

## 2014-02-20 DIAGNOSIS — G4733 Obstructive sleep apnea (adult) (pediatric): Secondary | ICD-10-CM

## 2014-02-20 DIAGNOSIS — J9610 Chronic respiratory failure, unspecified whether with hypoxia or hypercapnia: Secondary | ICD-10-CM

## 2014-02-20 DIAGNOSIS — M1990 Unspecified osteoarthritis, unspecified site: Secondary | ICD-10-CM

## 2014-02-20 DIAGNOSIS — E8881 Metabolic syndrome: Secondary | ICD-10-CM | POA: Insufficient documentation

## 2014-02-20 DIAGNOSIS — E559 Vitamin D deficiency, unspecified: Secondary | ICD-10-CM

## 2014-02-20 DIAGNOSIS — E119 Type 2 diabetes mellitus without complications: Secondary | ICD-10-CM

## 2014-02-20 DIAGNOSIS — F319 Bipolar disorder, unspecified: Secondary | ICD-10-CM

## 2014-02-20 DIAGNOSIS — E538 Deficiency of other specified B group vitamins: Secondary | ICD-10-CM

## 2014-02-20 DIAGNOSIS — G43909 Migraine, unspecified, not intractable, without status migrainosus: Secondary | ICD-10-CM

## 2014-02-20 DIAGNOSIS — I10 Essential (primary) hypertension: Secondary | ICD-10-CM

## 2014-02-20 DIAGNOSIS — G8929 Other chronic pain: Secondary | ICD-10-CM

## 2014-02-20 DIAGNOSIS — K3184 Gastroparesis: Secondary | ICD-10-CM

## 2014-02-20 LAB — POCT CBC
Granulocyte percent: 65.6 %G (ref 37–80)
HCT, POC: 43 % (ref 37.7–47.9)
Hemoglobin: 12.9 g/dL (ref 12.2–16.2)
Lymph, poc: 1.5 (ref 0.6–3.4)
MCH, POC: 26 pg — AB (ref 27–31.2)
MCHC: 29.9 g/dL — AB (ref 31.8–35.4)
MCV: 86.9 fL (ref 80–97)
MPV: 9.3 fL (ref 0–99.8)
POC Granulocyte: 3.1 (ref 2–6.9)
POC LYMPH PERCENT: 31.2 %L (ref 10–50)
Platelet Count, POC: 267 10*3/uL (ref 142–424)
RBC: 5 M/uL (ref 4.04–5.48)
RDW, POC: 14 %
WBC: 4.8 10*3/uL (ref 4.6–10.2)

## 2014-02-20 LAB — POCT GLYCOSYLATED HEMOGLOBIN (HGB A1C): Hemoglobin A1C: 6.7

## 2014-02-20 MED ORDER — QUETIAPINE FUMARATE 50 MG PO TABS: 50.0000 mg | ORAL_TABLET | Freq: Two times a day (BID) | ORAL | Status: DC

## 2014-02-20 NOTE — Patient Instructions (Signed)
Dr Paula Libra Recommendations  For nutrition information, I recommend books:  1).Eat to Live by Dr Excell Seltzer. 2).Prevent and Reverse Heart Disease by Dr Karl Luke. 3) Dr Janene Harvey Book:  Program to Reverse Diabetes  Exercise recommendations are:  If unable to walk, then the patient can exercise in a chair 3 times a day. By flapping arms like a bird gently and raising legs outwards to the front.  If ambulatory, the patient can go for walks for 30 minutes 3 times a week. Then increase the intensity and duration as tolerated.  Goal is to try to attain exercise frequency to 5 times a week.  If applicable: Best to perform resistance exercises (machines or weights) 2 days a week and cardio type exercises 3 days per week.    Consider bariatric  Surgery  DASH Diet The DASH diet stands for "Dietary Approaches to Stop Hypertension." It is a healthy eating plan that has been shown to reduce high blood pressure (hypertension) in as little as 14 days, while also possibly providing other significant health benefits. These other health benefits include reducing the risk of breast cancer after menopause and reducing the risk of type 2 diabetes, heart disease, colon cancer, and stroke. Health benefits also include weight loss and slowing kidney failure in patients with chronic kidney disease.  DIET GUIDELINES  Limit salt (sodium). Your diet should contain less than 1500 mg of sodium daily.  Limit refined or processed carbohydrates. Your diet should include mostly whole grains. Desserts and added sugars should be used sparingly.  Include small amounts of heart-healthy fats. These types of fats include nuts, oils, and tub margarine. Limit saturated and trans fats. These fats have been shown to be harmful in the body. CHOOSING FOODS  The following food groups are based on a 2000 calorie diet. See your Registered Dietitian for individual calorie needs. Grains and Grain Products  (6 to 8 servings daily)  Eat More Often: Whole-wheat bread, brown rice, whole-grain or wheat pasta, quinoa, popcorn without added fat or salt (air popped).  Eat Less Often: White bread, white pasta, white rice, cornbread. Vegetables (4 to 5 servings daily)  Eat More Often: Fresh, frozen, and canned vegetables. Vegetables may be raw, steamed, roasted, or grilled with a minimal amount of fat.  Eat Less Often/Avoid: Creamed or fried vegetables. Vegetables in a cheese sauce. Fruit (4 to 5 servings daily)  Eat More Often: All fresh, canned (in natural juice), or frozen fruits. Dried fruits without added sugar. One hundred percent fruit juice ( cup [237 mL] daily).  Eat Less Often: Dried fruits with added sugar. Canned fruit in light or heavy syrup. [employer], Fish, and Poultry (2 servings or less daily. One serving is 3 to 4 oz [85-114 g]).  Eat More Often: Ninety percent or leaner ground beef, tenderloin, sirloin. Round cuts of beef, chicken breast, [place] breast. All fish. Grill, bake, or broil your meat. Nothing should be fried.  Eat Less Often/Avoid: Fatty cuts of meat, [place], or chicken leg, thigh, or wing. Fried cuts of meat or fish. Dairy (2 to 3 servings)  Eat More Often: Low-fat or fat-free milk, low-fat plain or light yogurt, reduced-fat or part-skim cheese.  Eat Less Often/Avoid: Milk (whole, 2%).Whole milk yogurt. Full-fat cheeses. Nuts, Seeds, and Legumes (4 to 5 servings per week)  Eat More Often: All without added salt.  Eat Less Often/Avoid: Salted nuts and seeds, canned beans with added salt. Fats and Sweets (limited)  Eat More Often: Vegetable oils, tub margarines without trans fats, sugar-free gelatin. Mayonnaise and salad dressings.  Eat Less Often/Avoid: Coconut oils, palm oils, butter, stick margarine, cream, half and half, cookies, candy, pie. FOR MORE INFORMATION The Dash Diet Eating Plan: www.dashdiet.org Document Released: 11/17/2011 Document Revised:  02/20/2012 Document Reviewed: 11/17/2011 [facility] Patient Information 2014 [place], [place].   Obesity Obesity is defined as having too much total body fat and a body mass index (BMI) of 30 or more. BMI is an estimate of body fat and is calculated from your height and weight. Obesity happens when you consume more calories than you can burn by exercising or performing daily physical tasks. Prolonged obesity can cause major illnesses or emergencies, such as:   A stroke.  Heart disease.  Diabetes.  Cancer.  Arthritis.  High blood pressure (hypertension).  High cholesterol.  Sleep apnea.  Erectile dysfunction.  Infertility problems. CAUSES   Regularly eating unhealthy foods.  Physical inactivity.  Certain disorders, such as an underactive thyroid (hypothyroidism), Cushing's syndrome, and polycystic ovarian syndrome.  Certain medicines, such as steroids, some depression medicines, and antipsychotics.  Genetics.  Lack of sleep. DIAGNOSIS  A caregiver can diagnose obesity after calculating your BMI. Obesity will be diagnosed if your BMI is 30 or higher.  There are other methods of measuring obesity levels. Some other methods include measuring your skin fold thickness, your waist circumference, and comparing your hip circumference to your waist circumference. TREATMENT  A healthy treatment program includes some or all of the following:  Long-term dietary changes.  Exercise and physical activity.  Behavioral and lifestyle changes.  Medicine only under the supervision of your caregiver. Medicines may help, but only if they are used with diet and exercise programs. An unhealthy treatment program includes:  Fasting.  Fad diets.  Supplements and drugs. These choices do not succeed in long-term weight control.  HOME CARE INSTRUCTIONS   Exercise and perform physical activity as directed by your caregiver. To increase physical activity, try the following:  Use stairs  instead of elevators.  Park farther away from store entrances.  Garden, bike, or walk instead of watching television or using the computer.  Eat healthy, low-calorie foods and drinks on a regular basis. Eat more fruits and vegetables. Use low-calorie cookbooks or take healthy cooking classes.  Limit fast food, sweets, and processed snack foods.  Eat smaller portions.  Keep a daily journal of everything you eat. There are many free websites to help you with this. It may be helpful to measure your foods so you can determine if you are eating the correct portion sizes.  Avoid drinking alcohol. Drink more water and drinks without calories.  Take vitamins and supplements only as recommended by your caregiver.  Weight-loss support groups, Nurse, mental health, counselors, and stress reduction education can also be very helpful. SEEK IMMEDIATE MEDICAL CARE IF:  You have chest pain or tightness.  You have trouble breathing or feel short of breath.  You have weakness or leg numbness.  You feel confused or have trouble talking.  You have sudden changes in your vision. MAKE SURE YOU:  Understand these instructions.  Will watch your condition.  Will get help right away if you are not doing well or get worse. Document Released: 01/05/2005 Document Revised: 05/29/2012 Document Reviewed: 01/04/2012 [facility] Patient Information 2014 [place].

## 2014-02-20 NOTE — Progress Notes (Signed)
Patient ID: Morgan [name], female   DOB: [birth year], 48 y.o.   MRN: [id number] SUBJECTIVE: CC: Chief Complaint  Patient presents with  . Follow-up    4 month ck up chronic problems. states had stomach virus 2 weeks ago and now has "bloatness" . wants rx for plain serquoel due to insurance     HPI:  As above: had the stomach virus as above and now gets cramps when she eats.  Breakfast:oatmeal or 1-2 eggs and a piece of toast Snack: peanut butter cracker or popcorn Lunch: sandwich, ham, or  A can of tuna with crackers. Snack: peanut butter crackers or fruit or cracker Supper: beans, or meat: baked chicken eg a leg, occasional baked potato. Gravy.   Patient is here for follow up of Diabetes Mellitus/HTN/B12 def/Barett's esophagus/: Symptoms evaluated: Denies Nocturia ,Denies Urinary Frequency , denies Blurred vision ,deniesDizziness,denies.Dysuria,denies paresthesias, denies extremity pain or ulcers.Marland Kitchendenies chest pain. has had an annual eye exam. do check the feet. Does check CBGs. Average CBG:110s to 140s Denies episodes of hypoglycemia. Does have an emergency hypoglycemic plan. admits toCompliance with medications. Denies Problems with medications.   Still drinks coolaid Past Medical History  Diagnosis Date  . Unspecified hypothyroidism   . Constipation, chronic   . Vitamin B 12 deficiency   . Barrett's esophagus   . Osteoarthritis   . Gastroparesis   . Allergic rhinitis   . Chronic respiratory failure   . Migraine headache   . Hypertension   . Diabetes mellitus without complication   . GERD (gastroesophageal reflux disease)   . OSA (obstructive sleep apnea)     uses a cpap  . Sleep apnea     has c-pap  . Vitamin D deficiency    Past Surgical History  Procedure Laterality Date  . Cholecystectomy  2005  . Appendectomy  1978  . Tonsillectomy  at age 63  . Shoulder arthroscopy  7/12    left-dsc  . Trigger finger release  12/20/2012    Procedure: RELEASE TRIGGER  FINGER/A-1 PULLEY;  Surgeon: Tennis Must, MD;  Location: [facility];  Service: Orthopedics;  Laterality: Left;  LEFT TRIGGER THUMB RELEASE   History   Social History  . Marital Status: Single    Spouse Name: N/A    Number of Children: N  . Years of Education: N/A   Occupational History  . disabled    Social History Main Topics  . Smoking status: Never Smoker   . Smokeless tobacco: Not on file  . Alcohol Use: No  . Drug Use: No  . Sexual Activity: Not on file   Other Topics Concern  . Not on file   Social History Narrative  . No narrative on file   Family History  Problem Relation Age of Onset  . Asthma Father   . Allergies Father   . Heart disease Father     enlarged heart  . Peripheral vascular disease Father   . Asthma Sister   . Cancer Sister     colon at 66 yr old.  . Allergies Mother   . Allergies Brother   . Allergies Sister   . Diabetes Sister   . Colon cancer Sister    Current Outpatient Prescriptions on File Prior to Visit  Medication Sig Dispense Refill  . calcium-vitamin D (OSCAL WITH D) 500-200 MG-UNIT per tablet Take 1 tablet by mouth daily.       Marland Kitchen etodolac (LODINE) 500 MG tablet Take 500 mg by mouth  2 (two) times daily.        . fenofibrate micronized (ANTARA) 130 MG capsule TAKE (1) CAPSULE DAILY AT BEDTIME.  30 capsule  5  . ferrous gluconate (FERGON) 325 MG tablet Take 325 mg by mouth daily with breakfast.        . fluticasone (FLONASE) 50 MCG/ACT nasal spray SPRAY 1 SPRAY IN EACH NOSTRIL TWICE DAILY. SHAKE GENTLY BEFORE EACH USE.  16 g  3  . furosemide (LASIX) 20 MG tablet Take 1 tablet (20 mg total) by mouth daily.  30 tablet  5  . glucose blood (ONE TOUCH ULTRA TEST) test strip Use as instructed  100 each  0  . levothyroxine (SYNTHROID, LEVOTHROID) 150 MCG tablet Take 1 tablet (150 mcg total) by mouth daily.  30 tablet  9  . loratadine (CLARITIN) 10 MG tablet Take 10 mg by mouth daily. Prn        . MEDROXYPROGESTERONE ACETATE  PO Take by mouth. Take 1 tab daily for 12 days per month every other month       . metFORMIN (GLUCOPHAGE) 500 MG tablet Take 1 tablet (500 mg total) by mouth every evening.  30 tablet  11  . Multiple Vitamin (MULTIVITAMIN) capsule Take 1 capsule by mouth daily.        Marland Kitchen nystatin (MYCOSTATIN) powder Apply topically 2 (two) times daily as needed.        Marland Kitchen omeprazole (PRILOSEC) 40 MG capsule TAKE (1) CAPSULE DAILY  30 capsule  5  . potassium chloride (K-DUR,KLOR-CON) 10 MEQ tablet Take 1 tablet (10 mEq total) by mouth daily.  30 tablet  11  . pravastatin (PRAVACHOL) 20 MG tablet Take 1 tablet (20 mg total) by mouth daily.  30 tablet  2  . Probiotic Product (HEALTHY COLON PO) Take 2 capsules by mouth daily.       . sertraline (ZOLOFT) 100 MG tablet Take 1.5 tablets (150 mg total) by mouth daily. Take 1 and 1/2 tab daily  45 tablet  11  . traMADol (ULTRAM) 50 MG tablet Take 50 mg by mouth 2 (two) times daily.       . valsartan-hydrochlorothiazide (DIOVAN-HCT) 160-25 MG per tablet TAKE 1 TABLET DAILY  30 tablet  5   Current Facility-Administered Medications on File Prior to Visit  Medication Dose Route Frequency Provider Last Rate Last Dose  . cyanocobalamin ((VITAMIN B-12)) injection 1,000 mcg  1,000 mcg Intramuscular Q30 days Chipper Herb, MD   1,000 mcg at 02/07/14 1124   Allergies  Allergen Reactions  . Codeine     REACTION: increased BP  . Ketoconazole     REACTION: hives, swelling  . Pravachol [Pravastatin Sodium] Swelling    Throat swelling  . Statins     REACTION: leg crapms    There is no immunization history on file for this patient. Prior to Admission medications   Medication Sig Start Date End Date Taking? Authorizing Provider  calcium-vitamin D (OSCAL WITH D) 500-200 MG-UNIT per tablet Take 1 tablet by mouth daily.     Historical Provider, MD  etodolac (LODINE) 500 MG tablet Take 500 mg by mouth 2 (two) times daily.      Historical Provider, MD  fenofibrate micronized  (ANTARA) 130 MG capsule TAKE (1) CAPSULE DAILY AT BEDTIME. 10/07/13   Vernie Shanks, MD  ferrous gluconate (FERGON) 325 MG tablet Take 325 mg by mouth daily with breakfast.      Historical Provider, MD  fluticasone (FLONASE) 50 MCG/ACT  nasal spray SPRAY 1 SPRAY IN EACH NOSTRIL TWICE DAILY. SHAKE GENTLY BEFORE EACH USE. 11/11/13   Vernie Shanks, MD  furosemide (LASIX) 20 MG tablet Take 1 tablet (20 mg total) by mouth daily. 10/07/13   Vernie Shanks, MD  glucose blood (ONE TOUCH ULTRA TEST) test strip Use as instructed 08/29/13   Chipper Herb, MD  levothyroxine (SYNTHROID, LEVOTHROID) 150 MCG tablet Take 1 tablet (150 mcg total) by mouth daily. 06/17/13   Erby Pian, FNP  loratadine (CLARITIN) 10 MG tablet Take 10 mg by mouth daily. Prn      Historical Provider, MD  MEDROXYPROGESTERONE ACETATE PO Take by mouth. Take 1 tab daily for 12 days per month every other month     Historical Provider, MD  metFORMIN (GLUCOPHAGE) 500 MG tablet Take 1 tablet (500 mg total) by mouth every evening. 06/18/13   Vernie Shanks, MD  Multiple Vitamin (MULTIVITAMIN) capsule Take 1 capsule by mouth daily.      Historical Provider, MD  nystatin (MYCOSTATIN) powder Apply topically 2 (two) times daily as needed.      Historical Provider, MD  omeprazole (PRILOSEC) 40 MG capsule TAKE (1) CAPSULE DAILY 10/07/13   Vernie Shanks, MD  potassium chloride (K-DUR,KLOR-CON) 10 MEQ tablet Take 1 tablet (10 mEq total) by mouth daily. 06/18/13   Vernie Shanks, MD  pravastatin (PRAVACHOL) 20 MG tablet Take 1 tablet (20 mg total) by mouth daily. 10/28/13   Tammy Eckard, PHARMD  Probiotic Product (HEALTHY COLON PO) Take 2 capsules by mouth daily.     Historical Provider, MD  QUEtiapine Fumarate (SEROQUEL XR) 150 MG 24 hr tablet Take 1 tablet (150 mg total) by mouth at bedtime. 1 po q day 10/07/13   Vernie Shanks, MD  sertraline (ZOLOFT) 100 MG tablet Take 1.5 tablets (150 mg total) by mouth daily. Take 1 and 1/2 tab daily 06/18/13    Vernie Shanks, MD  traMADol (ULTRAM) 50 MG tablet Take 50 mg by mouth 2 (two) times daily.  02/27/13   Historical Provider, MD  valsartan-hydrochlorothiazide (DIOVAN-HCT) 160-25 MG per tablet TAKE 1 TABLET DAILY 10/07/13   Vernie Shanks, MD     ROS: As above in the HPI. All other systems are stable or negative.  OBJECTIVE: APPEARANCE:  Patient in no acute distress.The patient appeared well nourished and normally developed. Acyanotic. Waist: VITAL SIGNS:BP 153/75  Pulse 78  Temp(Src) 97.6 F (36.4 C) (Oral)  Ht _0  (1.651 m)  Wt 326 lb 12.8 oz (148.236 kg)  BMI 54.38 kg/m2  SpO2 96%  LMP 01/23/2014 Morbidly Obese WF on portable O2. Via [place].  SKIN: warm and  Dry without overt rashes, tattoos and scars  HEAD and Neck: without JVD, Head and scalp: normal Eyes:No scleral icterus. Fundi normal, eye movements normal. Ears: Auricle normal, canal normal, Tympanic membranes normal, insufflation normal. Nose: normal Throat: normal Neck & thyroid: normal  CHEST & LUNGS: Chest wall: normal Lungs: Clear  CVS: Reveals the PMI to be normally located. Regular rhythm, First and Second Heart sounds are normal,  absence of murmurs, rubs or gallops. Peripheral vasculature: Radial pulses: normal Dorsal pedis pulses: normal Posterior pulses: normal  ABDOMEN:  Appearance: morbidly Obese Benign, no organomegaly, no masses, no Abdominal Aortic enlargement. No Guarding , no rebound. No Bruits. Bowel sounds: normal  RECTAL: N/A GU: N/A  EXTREMETIES: nonedematous.  MUSCULOSKELETAL:  Spine: normal Joints: intact  NEUROLOGIC: oriented to time,place and person; nonfocal.  ASSESSMENT:  RESPIRATORY FAILURE, CHRONIC  OBSTRUCTIVE SLEEP APNEA  Morbid obesity  HYPERTENSION  DM (diabetes mellitus) - Plan: POCT glycosylated hemoglobin (Hb A1C)  Unspecified hypothyroidism - Plan: TSH  OSTEOARTHRITIS  HLD (hyperlipidemia) - Plan: CMP14+EGFR, NMR, lipoprofile  Vitamin B 12  deficiency - Plan: Vitamin B12  BARRETTS ESOPHAGUS  Gastroparesis  MIGRAINE HEADACHE  Abdominal pain, chronic, epigastric - Plan: POCT CBC, CMP14+EGFR, Amylase, Lipase  Bipolar disorder, unspecified - Plan: QUEtiapine (SEROQUEL) 50 MG tablet  Vitamin D deficiency - Plan: Vit D  25 hydroxy (rtn osteoporosis monitoring)  Metabolic syndrome Patient no longer seeing a psychiatrist because she cannot afford it. She requests a change to cheaper seroquel.  PLAN: Recommend taking the probiotics.  Dietary changes recommended Lifestyle changes. Recommend consider Bariatric surgery to try to improve her morbidity and mortality. She declines. I think the patient has a poor longterm prognosis. She has poor motivation, lacking interest to even exercise in a  Chair.       Dr Paula Libra Recommendations  For nutrition information, I recommend books:  1).Eat to Live by Dr Excell Seltzer. 2).Prevent and Reverse Heart Disease by Dr Karl Luke. 3) Dr Janene Harvey Book:  Program to Reverse Diabetes  Exercise recommendations are:  If unable to walk, then the patient can exercise in a chair 3 times a day. By flapping arms like a bird gently and raising legs outwards to the front.  If ambulatory, the patient can go for walks for 30 minutes 3 times a week. Then increase the intensity and duration as tolerated.  Goal is to try to attain exercise frequency to 5 times a week.  If applicable: Best to perform resistance exercises (machines or weights) 2 days a week and cardio type exercises 3 days per week.   Handout on Obesity & DASH diet given in the AVS   Orders Placed This Encounter  Procedures  . CMP14+EGFR  . Amylase  . Lipase  . NMR, lipoprofile  . TSH  . Vit D  25 hydroxy (rtn osteoporosis monitoring)  . Vitamin B12  . POCT CBC  . POCT glycosylated hemoglobin (Hb A1C)   Meds ordered this encounter  Medications  . Cholecalciferol (VITAMIN D) 2000 UNITS CAPS    Sig:  Take 2,000 Units by mouth daily.  . QUEtiapine (SEROQUEL) 50 MG tablet    Sig: Take 1 tablet (50 mg total) by mouth 2 (two) times daily.    Dispense:  60 tablet    Refill:  2   Medications Discontinued During This Encounter  Medication Reason  . QUEtiapine Fumarate (SEROQUEL XR) 150 MG 24 hr tablet Formulary change   Return in about 4 weeks (around 03/20/2014) for Recheck medical problems, recheck th eseroquel.  Terrion Gencarelli P. Jacelyn Grip, M.D.

## 2014-02-22 LAB — NMR, LIPOPROFILE
Cholesterol: 219 mg/dL — ABNORMAL HIGH (ref <200)
HDL Cholesterol by NMR: 37 mg/dL — ABNORMAL LOW (ref >=40)
HDL Particle Number: 29 umol/L — ABNORMAL LOW (ref >=30.5)
LDL Particle Number: 2382 nmol/L — ABNORMAL HIGH (ref <1000)
LDL Size: 19.5 nm — ABNORMAL LOW (ref >20.5)
LP-IR Score: 86 — ABNORMAL HIGH (ref <=45)
Small LDL Particle Number: 2086 nmol/L — ABNORMAL HIGH (ref <=527)
Triglycerides by NMR: 485 mg/dL — ABNORMAL HIGH (ref <150)

## 2014-02-22 LAB — CMP14+EGFR
ALT: 45 IU/L — ABNORMAL HIGH (ref 0–32)
AST: 36 IU/L (ref 0–40)
Albumin/Globulin Ratio: 1.8 (ref 1.1–2.5)
Albumin: 4.2 g/dL (ref 3.5–5.5)
Alkaline Phosphatase: 57 IU/L (ref 39–117)
BUN/Creatinine Ratio: 20 (ref 9–23)
BUN: 16 mg/dL (ref 6–24)
CO2: 26 mmol/L (ref 18–29)
Calcium: 9.4 mg/dL (ref 8.7–10.2)
Chloride: 100 mmol/L (ref 97–108)
Creatinine, Ser: 0.79 mg/dL (ref 0.57–1.00)
GFR calc Af Amer: 104 mL/min/{1.73_m2} (ref >59)
GFR calc non Af Amer: 90 mL/min/{1.73_m2} (ref >59)
Globulin, Total: 2.4 g/dL (ref 1.5–4.5)
Glucose: 136 mg/dL — ABNORMAL HIGH (ref 65–99)
Potassium: 3.9 mmol/L (ref 3.5–5.2)
Sodium: 143 mmol/L (ref 134–144)
Total Bilirubin: 0.2 mg/dL (ref 0.0–1.2)
Total Protein: 6.6 g/dL (ref 6.0–8.5)

## 2014-02-22 LAB — TSH: TSH: 3.01 u[IU]/mL (ref 0.450–4.500)

## 2014-02-22 LAB — AMYLASE: Amylase: 24 U/L — ABNORMAL LOW (ref 31–124)

## 2014-02-22 LAB — LIPASE: Lipase: 80 U/L — ABNORMAL HIGH (ref 0–59)

## 2014-02-22 LAB — VITAMIN D 25 HYDROXY (VIT D DEFICIENCY, FRACTURES): Vit D, 25-Hydroxy: 26.9 ng/mL — ABNORMAL LOW (ref 30.0–100.0)

## 2014-02-22 LAB — VITAMIN B12: Vitamin B-12: 728 pg/mL (ref 211–946)

## 2014-02-23 NOTE — Progress Notes (Signed)
Quick Note:  Call Patient Labs that are abnormal: Lipase is elevated. She may have pancreatitis with the elevated triglyceride and with the seroquel. She needs to come in for Recheck in the next day or 2 for Recheck and she needs to see a Psychiatrist to consider changing her medications.    ______

## 2014-02-25 ENCOUNTER — Encounter: Payer: Self-pay | Admitting: Family Medicine

## 2014-02-25 ENCOUNTER — Other Ambulatory Visit: Payer: Self-pay | Admitting: Family Medicine

## 2014-02-25 ENCOUNTER — Other Ambulatory Visit (INDEPENDENT_AMBULATORY_CARE_PROVIDER_SITE_OTHER): Payer: Medicare Other | Admitting: Family Medicine

## 2014-02-25 ENCOUNTER — Telehealth: Payer: Self-pay | Admitting: Family Medicine

## 2014-02-25 ENCOUNTER — Ambulatory Visit (HOSPITAL_COMMUNITY)
Admission: RE | Admit: 2014-02-25 | Discharge: 2014-02-25 | Disposition: A | Discharge status: Home / Self Care | Payer: Medicare Other | Source: Ambulatory Visit | Attending: Family Medicine | Admitting: Family Medicine

## 2014-02-25 DIAGNOSIS — K859 Acute pancreatitis without necrosis or infection, unspecified: Secondary | ICD-10-CM

## 2014-02-25 DIAGNOSIS — R109 Unspecified abdominal pain: Secondary | ICD-10-CM | POA: Insufficient documentation

## 2014-02-25 DIAGNOSIS — K760 Fatty (change of) liver, not elsewhere classified: Secondary | ICD-10-CM | POA: Insufficient documentation

## 2014-02-25 LAB — POCT CBC
Granulocyte percent: 64.5 %G (ref 37–80)
HCT, POC: 40.9 % (ref 37.7–47.9)
Hemoglobin: 12.9 g/dL (ref 12.2–16.2)
Lymph, poc: 1.5 (ref 0.6–3.4)
MCH, POC: 27 pg (ref 27–31.2)
MCHC: 31.5 g/dL — AB (ref 31.8–35.4)
MCV: 85.8 fL (ref 80–97)
MPV: 8.2 fL (ref 0–99.8)
POC Granulocyte: 2.9 (ref 2–6.9)
POC LYMPH PERCENT: 33.3 %L (ref 10–50)
Platelet Count, POC: 268 10*3/uL (ref 142–424)
RBC: 4.8 M/uL (ref 4.04–5.48)
RDW, POC: 13.4 %
WBC: 4.5 10*3/uL — AB (ref 4.6–10.2)

## 2014-02-25 NOTE — Progress Notes (Signed)
Patient ID: Morgan [name], female   DOB: October 09, 1966, 48 y.o.   MRN: [id number] SUBJECTIVE: CC: Chief Complaint  Patient presents with  . Follow-up    follow up on abnormal lab results    HPI: Abnormal labs  Suspicious for pancreatitis. Patient called back in. Her epigastric bloating and  Discomfort has improved since being seen. She has stopped seeing her psychiatrist. Never had pancreatitis before.  Past Medical History  Diagnosis Date  . Unspecified hypothyroidism   . Constipation, chronic   . Vitamin B 12 deficiency   . Barrett's esophagus   . Osteoarthritis   . Gastroparesis   . Allergic rhinitis   . Chronic respiratory failure   . Migraine headache   . Hypertension   . Diabetes mellitus without complication   . GERD (gastroesophageal reflux disease)   . OSA (obstructive sleep apnea)     uses a cpap  . Sleep apnea     has c-pap  . Vitamin D deficiency    Past Surgical History  Procedure Laterality Date  . Cholecystectomy  2005  . Appendectomy  1978  . Tonsillectomy  at age 13  . Shoulder arthroscopy  7/12    left-dsc  . Trigger finger release  12/20/2012    Procedure: RELEASE TRIGGER FINGER/A-1 PULLEY;  Surgeon: Tennis Must, MD;  Location: [place];  Service: Orthopedics;  Laterality: Left;  LEFT TRIGGER THUMB RELEASE   History   Social History  . Marital Status: Single    Spouse Name: N/A    Number of Children: N  . Years of Education: N/A   Occupational History  . disabled    Social History Main Topics  . Smoking status: Never Smoker   . Smokeless tobacco: Not on file  . Alcohol Use: No  . Drug Use: No  . Sexual Activity: Not on file   Other Topics Concern  . Not on file   Social History Narrative  . No narrative on file   Family History  Problem Relation Age of Onset  . Asthma Father   . Allergies Father   . Heart disease Father     enlarged heart  . Peripheral vascular disease Father   . Asthma Sister   . Cancer  Sister     colon at 63 yr old.  . Allergies Mother   . Allergies Brother   . Allergies Sister   . Diabetes Sister   . Colon cancer Sister    Current Outpatient Prescriptions on File Prior to Visit  Medication Sig Dispense Refill  . calcium-vitamin D (OSCAL WITH D) 500-200 MG-UNIT per tablet Take 1 tablet by mouth daily.       . Cholecalciferol (VITAMIN D) 2000 UNITS CAPS Take 2,000 Units by mouth daily.      Marland Kitchen etodolac (LODINE) 500 MG tablet Take 500 mg by mouth 2 (two) times daily.        . fenofibrate micronized (ANTARA) 130 MG capsule TAKE (1) CAPSULE DAILY AT BEDTIME.  30 capsule  5  . ferrous gluconate (FERGON) 325 MG tablet Take 325 mg by mouth daily with breakfast.        . fluticasone (FLONASE) 50 MCG/ACT nasal spray SPRAY 1 SPRAY IN EACH NOSTRIL TWICE DAILY. SHAKE GENTLY BEFORE EACH USE.  16 g  3  . furosemide (LASIX) 20 MG tablet Take 1 tablet (20 mg total) by mouth daily.  30 tablet  5  . glucose blood (ONE TOUCH ULTRA TEST) test strip  Use as instructed  100 each  0  . levothyroxine (SYNTHROID, LEVOTHROID) 150 MCG tablet Take 1 tablet (150 mcg total) by mouth daily.  30 tablet  9  . loratadine (CLARITIN) 10 MG tablet Take 10 mg by mouth daily. Prn        . MEDROXYPROGESTERONE ACETATE PO Take by mouth. Take 1 tab daily for 12 days per month every other month       . metFORMIN (GLUCOPHAGE) 500 MG tablet Take 1 tablet (500 mg total) by mouth every evening.  30 tablet  11  . Multiple Vitamin (MULTIVITAMIN) capsule Take 1 capsule by mouth daily.        Marland Kitchen nystatin (MYCOSTATIN) powder Apply topically 2 (two) times daily as needed.        Marland Kitchen omeprazole (PRILOSEC) 40 MG capsule TAKE (1) CAPSULE DAILY  30 capsule  5  . potassium chloride (K-DUR,KLOR-CON) 10 MEQ tablet Take 1 tablet (10 mEq total) by mouth daily.  30 tablet  11  . pravastatin (PRAVACHOL) 20 MG tablet Take 1 tablet (20 mg total) by mouth daily.  30 tablet  2  . Probiotic Product (HEALTHY COLON PO) Take 2 capsules by mouth  daily.       . QUEtiapine (SEROQUEL) 50 MG tablet Take 1 tablet (50 mg total) by mouth 2 (two) times daily.  60 tablet  2  . sertraline (ZOLOFT) 100 MG tablet Take 1.5 tablets (150 mg total) by mouth daily. Take 1 and 1/2 tab daily  45 tablet  11  . traMADol (ULTRAM) 50 MG tablet Take 50 mg by mouth 2 (two) times daily.       . valsartan-hydrochlorothiazide (DIOVAN-HCT) 160-25 MG per tablet TAKE 1 TABLET DAILY  30 tablet  5   Current Facility-Administered Medications on File Prior to Visit  Medication Dose Route Frequency Provider Last Rate Last Dose  . cyanocobalamin ((VITAMIN B-12)) injection 1,000 mcg  1,000 mcg Intramuscular Q30 days Chipper Herb, MD   1,000 mcg at 02/07/14 1124   Allergies  Allergen Reactions  . Codeine     REACTION: increased BP  . Ketoconazole     REACTION: hives, swelling  . Pravachol [Pravastatin Sodium] Swelling    Throat swelling  . Statins     REACTION: leg crapms    There is no immunization history on file for this patient. Prior to Admission medications   Medication Sig Start Date End Date Taking? Authorizing Provider  calcium-vitamin D (OSCAL WITH D) 500-200 MG-UNIT per tablet Take 1 tablet by mouth daily.     Historical Provider, MD  Cholecalciferol (VITAMIN D) 2000 UNITS CAPS Take 2,000 Units by mouth daily.    Historical Provider, MD  etodolac (LODINE) 500 MG tablet Take 500 mg by mouth 2 (two) times daily.      Historical Provider, MD  fenofibrate micronized (ANTARA) 130 MG capsule TAKE (1) CAPSULE DAILY AT BEDTIME. 10/07/13   Vernie Shanks, MD  ferrous gluconate (FERGON) 325 MG tablet Take 325 mg by mouth daily with breakfast.      Historical Provider, MD  fluticasone (FLONASE) 50 MCG/ACT nasal spray SPRAY 1 SPRAY IN EACH NOSTRIL TWICE DAILY. SHAKE GENTLY BEFORE EACH USE. 11/11/13   Vernie Shanks, MD  furosemide (LASIX) 20 MG tablet Take 1 tablet (20 mg total) by mouth daily. 10/07/13   Vernie Shanks, MD  glucose blood (ONE TOUCH ULTRA TEST)  test strip Use as instructed 08/29/13   Chipper Herb, MD  levothyroxine (  SYNTHROID, LEVOTHROID) 150 MCG tablet Take 1 tablet (150 mcg total) by mouth daily. 06/17/13   Erby Pian, FNP  loratadine (CLARITIN) 10 MG tablet Take 10 mg by mouth daily. Prn      Historical Provider, MD  MEDROXYPROGESTERONE ACETATE PO Take by mouth. Take 1 tab daily for 12 days per month every other month     Historical Provider, MD  metFORMIN (GLUCOPHAGE) 500 MG tablet Take 1 tablet (500 mg total) by mouth every evening. 06/18/13   Vernie Shanks, MD  Multiple Vitamin (MULTIVITAMIN) capsule Take 1 capsule by mouth daily.      Historical Provider, MD  nystatin (MYCOSTATIN) powder Apply topically 2 (two) times daily as needed.      Historical Provider, MD  omeprazole (PRILOSEC) 40 MG capsule TAKE (1) CAPSULE DAILY 10/07/13   Vernie Shanks, MD  potassium chloride (K-DUR,KLOR-CON) 10 MEQ tablet Take 1 tablet (10 mEq total) by mouth daily. 06/18/13   Vernie Shanks, MD  pravastatin (PRAVACHOL) 20 MG tablet Take 1 tablet (20 mg total) by mouth daily. 10/28/13   Tammy Eckard, PHARMD  Probiotic Product (HEALTHY COLON PO) Take 2 capsules by mouth daily.     Historical Provider, MD  QUEtiapine (SEROQUEL) 50 MG tablet Take 1 tablet (50 mg total) by mouth 2 (two) times daily. 02/20/14   Vernie Shanks, MD  sertraline (ZOLOFT) 100 MG tablet Take 1.5 tablets (150 mg total) by mouth daily. Take 1 and 1/2 tab daily 06/18/13   Vernie Shanks, MD  traMADol (ULTRAM) 50 MG tablet Take 50 mg by mouth 2 (two) times daily.  02/27/13   Historical Provider, MD  valsartan-hydrochlorothiazide (DIOVAN-HCT) 160-25 MG per tablet TAKE 1 TABLET DAILY 10/07/13   Vernie Shanks, MD     ROS: As above in the HPI. All other systems are stable or negative.  OBJECTIVE: APPEARANCE:  Patient in no acute distress.The patient appeared well nourished and normally developed. Acyanotic. Waist: VITAL SIGNS:BP 138/92  Pulse 66  Temp(Src) 98.9 F (37.2 C)  (Oral)  Ht 5\' 5"  (1.651 m)  Wt 325 lb (147.419 kg)  BMI 54.08 kg/m2  LMP 01/23/2014  Morbidly obese WF On portable O2 via [place].  SKIN: warm and  Dry without overt rashes, tattoos and scars  HEAD and Neck: without JVD, Head and scalp: normal Eyes:No scleral icterus. Fundi normal, eye movements normal. Ears: Auricle normal, canal normal, Tympanic membranes normal, insufflation normal. Nose: normal Throat: normal Neck & thyroid: normal  CHEST & LUNGS: Chest wall: normal Lungs: Clear  CVS: Reveals the PMI to be normally located. Regular rhythm, First and Second Heart sounds are normal,  absence of murmurs, rubs or gallops. Peripheral vasculature: Radial pulses: normal Dorsal pedis pulses: normal Posterior pulses: normal  ABDOMEN:  Appearance: morbidly obese with mild epigastric tenderness. Benign, no organomegaly, no masses, no Abdominal Aortic enlargement. No Guarding , no rebound. No Bruits. Bowel sounds: normal  RECTAL: N/A GU: N/A  EXTREMETIES: nonedematous.  MUSCULOSKELETAL:  Spine: normal Joints: intact  NEUROLOGIC: oriented to time,place and person; nonfocal.  Results for orders placed in visit on 02/25/14  POCT CBC      Result Value Ref Range   WBC 4.5 (*) 4.6 - 10.2 K/uL   Lymph, poc 1.5  0.6 - 3.4   POC LYMPH PERCENT 33.3  10 - 50 %L   MID (cbc)    0 - 0.9   POC MID %    0 - 12 %M  POC Granulocyte 2.9  2 - 6.9   Granulocyte percent 64.5  37 - 80 %G   RBC 4.8  4.04 - 5.48 M/uL   Hemoglobin 12.9  12.2 - 16.2 g/dL   HCT, POC 40.9  37.7 - 47.9 %   MCV 85.8  80 - 97 fL   MCH, POC 27.0  27 - 31.2 pg   MCHC 31.5 (*) 31.8 - 35.4 g/dL   RDW, POC 13.4     Platelet Count, POC 268.0  142 - 424 K/uL   MPV 8.2  0 - 99.8 fL    ASSESSMENT: Pancreatitis - Plan: POCT CBC, Lipase, Hepatic function panel  PLAN:      Dr Paula Libra Recommendations  For nutrition information, I recommend books:  1).Eat to Live by Dr Excell Seltzer. 2).Prevent and  Reverse Heart Disease by Dr Karl Luke. 3) Dr Janene Harvey Book:  Program to Reverse Diabetes  Exercise recommendations are:  If unable to walk, then the patient can exercise in a chair 3 times a day. By flapping arms like a bird gently and raising legs outwards to the front.  If ambulatory, the patient can go for walks for 30 minutes 3 times a week. Then increase the intensity and duration as tolerated.  Goal is to try to attain exercise frequency to 5 times a week.  If applicable: Best to perform resistance exercises (machines or weights) 2 days a week and cardio type exercises 3 days per week.  No orders of the defined types were placed in this encounter.   No orders of the defined types were placed in this encounter.   There are no discontinued medications. Return in about 2 days (around 02/27/2014) for Recheck medical problems.  Otilio Groleau P. Jacelyn Grip, M.D.

## 2014-02-25 NOTE — Patient Instructions (Signed)
      Dr Alyze Lauf's Recommendations  For nutrition information, I recommend books:  1).Eat to Live by Dr Joel Fuhrman. 2).Prevent and Reverse Heart Disease by Dr Caldwell Esselstyn. 3) Dr Neal Barnard's Book:  Program to Reverse Diabetes  Exercise recommendations are:  If unable to walk, then the patient can exercise in a chair 3 times a day. By flapping arms like a bird gently and raising legs outwards to the front.  If ambulatory, the patient can go for walks for 30 minutes 3 times a week. Then increase the intensity and duration as tolerated.  Goal is to try to attain exercise frequency to 5 times a week.  If applicable: Best to perform resistance exercises (machines or weights) 2 days a week and cardio type exercises 3 days per week.  

## 2014-02-25 NOTE — Telephone Encounter (Signed)
Pt aware test results not reviewed yet by Dr Jacelyn Grip and as soon as reviewed will contac pt

## 2014-02-26 LAB — HEPATIC FUNCTION PANEL
ALT: 45 IU/L — ABNORMAL HIGH (ref 0–32)
AST: 38 IU/L (ref 0–40)
Albumin: 4.4 g/dL (ref 3.5–5.5)
Alkaline Phosphatase: 57 IU/L (ref 39–117)
Bilirubin, Direct: 0.11 mg/dL (ref 0.00–0.40)
Total Bilirubin: 0.2 mg/dL (ref 0.0–1.2)
Total Protein: 6.9 g/dL (ref 6.0–8.5)

## 2014-02-26 LAB — LIPASE: Lipase: 61 U/L — ABNORMAL HIGH (ref 0–59)

## 2014-02-27 ENCOUNTER — Encounter: Payer: Self-pay | Admitting: Family Medicine

## 2014-02-27 ENCOUNTER — Ambulatory Visit (INDEPENDENT_AMBULATORY_CARE_PROVIDER_SITE_OTHER): Payer: Medicare Other | Admitting: Family Medicine

## 2014-02-27 VITALS — BP 133/81 | HR 77 | Temp 97.0°F | Ht 65.0 in | Wt 325.0 lb

## 2014-02-27 DIAGNOSIS — E785 Hyperlipidemia, unspecified: Secondary | ICD-10-CM

## 2014-02-27 DIAGNOSIS — E119 Type 2 diabetes mellitus without complications: Secondary | ICD-10-CM

## 2014-02-27 DIAGNOSIS — F319 Bipolar disorder, unspecified: Secondary | ICD-10-CM

## 2014-02-27 DIAGNOSIS — G4733 Obstructive sleep apnea (adult) (pediatric): Secondary | ICD-10-CM

## 2014-02-27 DIAGNOSIS — K3184 Gastroparesis: Secondary | ICD-10-CM

## 2014-02-27 DIAGNOSIS — M1990 Unspecified osteoarthritis, unspecified site: Secondary | ICD-10-CM

## 2014-02-27 DIAGNOSIS — K7689 Other specified diseases of liver: Secondary | ICD-10-CM

## 2014-02-27 DIAGNOSIS — I10 Essential (primary) hypertension: Secondary | ICD-10-CM

## 2014-02-27 DIAGNOSIS — E8881 Metabolic syndrome: Secondary | ICD-10-CM

## 2014-02-27 DIAGNOSIS — K859 Acute pancreatitis without necrosis or infection, unspecified: Secondary | ICD-10-CM

## 2014-02-27 DIAGNOSIS — G43909 Migraine, unspecified, not intractable, without status migrainosus: Secondary | ICD-10-CM

## 2014-02-27 DIAGNOSIS — K760 Fatty (change of) liver, not elsewhere classified: Secondary | ICD-10-CM

## 2014-02-27 DIAGNOSIS — J9610 Chronic respiratory failure, unspecified whether with hypoxia or hypercapnia: Secondary | ICD-10-CM

## 2014-02-27 DIAGNOSIS — E559 Vitamin D deficiency, unspecified: Secondary | ICD-10-CM

## 2014-02-27 DIAGNOSIS — E039 Hypothyroidism, unspecified: Secondary | ICD-10-CM

## 2014-02-27 DIAGNOSIS — E538 Deficiency of other specified B group vitamins: Secondary | ICD-10-CM

## 2014-02-27 DIAGNOSIS — K2270 Barrett's esophagus without dysplasia: Secondary | ICD-10-CM

## 2014-02-27 NOTE — Progress Notes (Addendum)
Patient ID: Morgan [name], female   DOB: 19-Aug-1966, 48 y.o.   MRN: [id number] SUBJECTIVE: CC: Chief Complaint  Patient presents with  . Follow-up    2 day reck also face to face for usage of oxygen. upon arrival 02 on at 2l --97 andafter walking length of office without 02 was 94% and and after walking with the 02 on was 94% . pt states she does use the 02 "which is bled into her CPAP at night .    HPI:   Here for follow up of pancreatitis. Stomach pain is resolved. Changing her diet and is motivated to try changing her lifestyle to improve her health. The psychiatrist is no longer at [facility] [address] that she used to follow . So she did not make an appointment to see anyone else there.  Needs renewal of O2. Assessment of her O2 requirements.  Past Medical History  Diagnosis Date  . Unspecified hypothyroidism   . Constipation, chronic   . Vitamin B 12 deficiency   . Barrett's esophagus   . Osteoarthritis   . Gastroparesis   . Allergic rhinitis   . Chronic respiratory failure   . Migraine headache   . Hypertension   . Diabetes mellitus without complication   . GERD (gastroesophageal reflux disease)   . OSA (obstructive sleep apnea)     uses a cpap  . Sleep apnea     has c-pap  . Vitamin D deficiency    Past Surgical History  Procedure Laterality Date  . Cholecystectomy  2005  . Appendectomy  1978  . Tonsillectomy  at age 39  . Shoulder arthroscopy  7/12    left-dsc  . Trigger finger release  12/20/2012    Procedure: RELEASE TRIGGER FINGER/A-1 PULLEY;  Surgeon: Tennis Must, MD;  Location: [place];  Service: Orthopedics;  Laterality: Left;  LEFT TRIGGER THUMB RELEASE   History   Social History  . Marital Status: Single    Spouse Name: N/A    Number of Children: N  . Years of Education: N/A   Occupational History  . disabled    Social History Main Topics  . Smoking status: Never Smoker   . Smokeless tobacco: Not on file  . Alcohol Use: No  . Drug  Use: No  . Sexual Activity: Not on file   Other Topics Concern  . Not on file   Social History Narrative  . No narrative on file   Family History  Problem Relation Age of Onset  . Asthma Father   . Allergies Father   . Heart disease Father     enlarged heart  . Peripheral vascular disease Father   . Asthma Sister   . Cancer Sister     colon at 21 yr old.  . Allergies Mother   . Allergies Brother   . Allergies Sister   . Diabetes Sister   . Colon cancer Sister    Current Outpatient Prescriptions on File Prior to Visit  Medication Sig Dispense Refill  . calcium-vitamin D (OSCAL WITH D) 500-200 MG-UNIT per tablet Take 1 tablet by mouth daily.       . Cholecalciferol (VITAMIN D) 2000 UNITS CAPS Take 2,000 Units by mouth daily.      Marland Kitchen etodolac (LODINE) 500 MG tablet Take 500 mg by mouth 2 (two) times daily.        . fenofibrate micronized (ANTARA) 130 MG capsule TAKE (1) CAPSULE DAILY AT BEDTIME.  30 capsule  5  .  ferrous gluconate (FERGON) 325 MG tablet Take 325 mg by mouth daily with breakfast.        . fluticasone (FLONASE) 50 MCG/ACT nasal spray SPRAY 1 SPRAY IN EACH NOSTRIL TWICE DAILY. SHAKE GENTLY BEFORE EACH USE.  16 g  3  . furosemide (LASIX) 20 MG tablet Take 1 tablet (20 mg total) by mouth daily.  30 tablet  5  . glucose blood (ONE TOUCH ULTRA TEST) test strip Use as instructed  100 each  0  . levothyroxine (SYNTHROID, LEVOTHROID) 150 MCG tablet Take 1 tablet (150 mcg total) by mouth daily.  30 tablet  9  . loratadine (CLARITIN) 10 MG tablet Take 10 mg by mouth daily. Prn        . MEDROXYPROGESTERONE ACETATE PO Take by mouth. Take 1 tab daily for 12 days per month every other month       . metFORMIN (GLUCOPHAGE) 500 MG tablet Take 1 tablet (500 mg total) by mouth every evening.  30 tablet  11  . Multiple Vitamin (MULTIVITAMIN) capsule Take 1 capsule by mouth daily.        Marland Kitchen nystatin (MYCOSTATIN) powder Apply topically 2 (two) times daily as needed.        Marland Kitchen omeprazole  (PRILOSEC) 40 MG capsule TAKE (1) CAPSULE DAILY  30 capsule  5  . potassium chloride (K-DUR,KLOR-CON) 10 MEQ tablet Take 1 tablet (10 mEq total) by mouth daily.  30 tablet  11  . pravastatin (PRAVACHOL) 20 MG tablet Take 1 tablet (20 mg total) by mouth daily.  30 tablet  2  . Probiotic Product (HEALTHY COLON PO) Take 2 capsules by mouth daily.       . QUEtiapine (SEROQUEL) 50 MG tablet Take 1 tablet (50 mg total) by mouth 2 (two) times daily.  60 tablet  2  . sertraline (ZOLOFT) 100 MG tablet Take 1.5 tablets (150 mg total) by mouth daily. Take 1 and 1/2 tab daily  45 tablet  11  . traMADol (ULTRAM) 50 MG tablet Take 50 mg by mouth 2 (two) times daily.       . valsartan-hydrochlorothiazide (DIOVAN-HCT) 160-25 MG per tablet TAKE 1 TABLET DAILY  30 tablet  5   Current Facility-Administered Medications on File Prior to Visit  Medication Dose Route Frequency Provider Last Rate Last Dose  . cyanocobalamin ((VITAMIN B-12)) injection 1,000 mcg  1,000 mcg Intramuscular Q30 days Chipper Herb, MD   1,000 mcg at 02/07/14 1124   Allergies  Allergen Reactions  . Codeine     REACTION: increased BP  . Ketoconazole     REACTION: hives, swelling  . Pravachol [Pravastatin Sodium] Swelling    Throat swelling  . Statins     REACTION: leg crapms    There is no immunization history on file for this patient. Prior to Admission medications   Medication Sig Start Date End Date Taking? Authorizing Provider  calcium-vitamin D (OSCAL WITH D) 500-200 MG-UNIT per tablet Take 1 tablet by mouth daily.     Historical Provider, MD  Cholecalciferol (VITAMIN D) 2000 UNITS CAPS Take 2,000 Units by mouth daily.    Historical Provider, MD  etodolac (LODINE) 500 MG tablet Take 500 mg by mouth 2 (two) times daily.      Historical Provider, MD  fenofibrate micronized (ANTARA) 130 MG capsule TAKE (1) CAPSULE DAILY AT BEDTIME. 10/07/13   Vernie Shanks, MD  ferrous gluconate (FERGON) 325 MG tablet Take 325 mg by mouth daily  with breakfast.  Historical Provider, MD  fluticasone (FLONASE) 50 MCG/ACT nasal spray SPRAY 1 SPRAY IN EACH NOSTRIL TWICE DAILY. SHAKE GENTLY BEFORE EACH USE. 11/11/13   Vernie Shanks, MD  furosemide (LASIX) 20 MG tablet Take 1 tablet (20 mg total) by mouth daily. 10/07/13   Vernie Shanks, MD  glucose blood (ONE TOUCH ULTRA TEST) test strip Use as instructed 08/29/13   Chipper Herb, MD  levothyroxine (SYNTHROID, LEVOTHROID) 150 MCG tablet Take 1 tablet (150 mcg total) by mouth daily. 06/17/13   Erby Pian, FNP  loratadine (CLARITIN) 10 MG tablet Take 10 mg by mouth daily. Prn      Historical Provider, MD  MEDROXYPROGESTERONE ACETATE PO Take by mouth. Take 1 tab daily for 12 days per month every other month     Historical Provider, MD  metFORMIN (GLUCOPHAGE) 500 MG tablet Take 1 tablet (500 mg total) by mouth every evening. 06/18/13   Vernie Shanks, MD  Multiple Vitamin (MULTIVITAMIN) capsule Take 1 capsule by mouth daily.      Historical Provider, MD  nystatin (MYCOSTATIN) powder Apply topically 2 (two) times daily as needed.      Historical Provider, MD  omeprazole (PRILOSEC) 40 MG capsule TAKE (1) CAPSULE DAILY 10/07/13   Vernie Shanks, MD  potassium chloride (K-DUR,KLOR-CON) 10 MEQ tablet Take 1 tablet (10 mEq total) by mouth daily. 06/18/13   Vernie Shanks, MD  pravastatin (PRAVACHOL) 20 MG tablet Take 1 tablet (20 mg total) by mouth daily. 10/28/13   Tammy Eckard, PHARMD  Probiotic Product (HEALTHY COLON PO) Take 2 capsules by mouth daily.     Historical Provider, MD  QUEtiapine (SEROQUEL) 50 MG tablet Take 1 tablet (50 mg total) by mouth 2 (two) times daily. 02/20/14   Vernie Shanks, MD  sertraline (ZOLOFT) 100 MG tablet Take 1.5 tablets (150 mg total) by mouth daily. Take 1 and 1/2 tab daily 06/18/13   Vernie Shanks, MD  traMADol (ULTRAM) 50 MG tablet Take 50 mg by mouth 2 (two) times daily.  02/27/13   Historical Provider, MD  valsartan-hydrochlorothiazide (DIOVAN-HCT) 160-25 MG per  tablet TAKE 1 TABLET DAILY 10/07/13   Vernie Shanks, MD     ROS: As above in the HPI. All other systems are stable or negative.  OBJECTIVE: APPEARANCE:  Patient in no acute distress.The patient appeared well nourished and normally developed. Acyanotic. Waist: VITAL SIGNS:BP 133/81  Pulse 77  Temp(Src) 97 F (36.1 C) (Oral)  Ht 5\' 5"  (1.651 m)  Wt 325 lb (147.419 kg)  BMI 54.08 kg/m2  SpO2 97%  LMP 01/23/2014 Morbidly WF  SKIN: warm and  Dry without overt rashes, tattoos and scars  HEAD and Neck: without JVD, Head and scalp: normal Eyes:No scleral icterus. Fundi normal, eye movements normal. Ears: Auricle normal, canal normal, Tympanic membranes normal, insufflation normal. Nose: normal Throat: normal Neck & thyroid: normal  CHEST & LUNGS: Chest wall: normal Lungs: Clear  CVS: Reveals the PMI to be normally located. Regular rhythm, First and Second Heart sounds are normal,  absence of murmurs, rubs or gallops. Peripheral vasculature: Radial pulses: normal Dorsal pedis pulses: normal Posterior pulses: normal  ABDOMEN:  Appearance: Obese Benign, no organomegaly, no masses, no Abdominal Aortic enlargement. No Guarding , no rebound. No Bruits. Bowel sounds: normal  RECTAL: N/A GU: N/A  EXTREMETIES: nonedematous.  MUSCULOSKELETAL:  Spine: normal Joints: intact  NEUROLOGIC: oriented to time,place and person; nonfocal.    Results for orders placed in visit on  02/25/14  LIPASE      Result Value Ref Range   Lipase 61 (*) 0 - 59 U/L  HEPATIC FUNCTION PANEL      Result Value Ref Range   Total Protein 6.9  6.0 - 8.5 g/dL   Albumin 4.4  3.5 - 5.5 g/dL   Total Bilirubin 0.2  0.0 - 1.2 mg/dL   Bilirubin, Direct 0.11  0.00 - 0.40 mg/dL   Alkaline Phosphatase 57  39 - 117 IU/L   AST 38  0 - 40 IU/L   ALT 45 (*) 0 - 32 IU/L  POCT CBC      Result Value Ref Range   WBC 4.5 (*) 4.6 - 10.2 K/uL   Lymph, poc 1.5  0.6 - 3.4   POC LYMPH PERCENT 33.3  10 - 50  %L   MID (cbc)    0 - 0.9   POC MID %    0 - 12 %M   POC Granulocyte 2.9  2 - 6.9   Granulocyte percent 64.5  37 - 80 %G   RBC 4.8  4.04 - 5.48 M/uL   Hemoglobin 12.9  12.2 - 16.2 g/dL   HCT, POC 40.9  37.7 - 47.9 %   MCV 85.8  80 - 97 fL   MCH, POC 27.0  27 - 31.2 pg   MCHC 31.5 (*) 31.8 - 35.4 g/dL   RDW, POC 13.4     Platelet Count, POC 268.0  142 - 424 K/uL   MPV 8.2  0 - 99.8 fL    ASSESSMENT: Pancreatitis - Plan: Lipase, Hepatic function panel  RESPIRATORY FAILURE, CHRONIC  Metabolic syndrome  HYPERTENSION  Fatty liver disease, nonalcoholic  DM (diabetes mellitus)  OBSTRUCTIVE SLEEP APNEA - Plan: Ambulatory referral to Home Health  Unspecified hypothyroidism  OSTEOARTHRITIS  MIGRAINE HEADACHE  HLD (hyperlipidemia)  Gastroparesis  Bipolar disorder, unspecified  BARRETTS ESOPHAGUS  Vitamin D deficiency  Vitamin B 12 deficiency  PLAN:      Dr Paula Libra Recommendations  For nutrition information, I recommend books:  1).Eat to Live by Dr Excell Seltzer. 2).Prevent and Reverse Heart Disease by Dr Karl Luke. 3) Dr Janene Harvey Book:  Program to Reverse Diabetes  Exercise recommendations are:  If unable to walk, then the patient can exercise in a chair 3 times a day. By flapping arms like a bird gently and raising legs outwards to the front.  If ambulatory, the patient can go for walks for 30 minutes 3 times a week. Then increase the intensity and duration as tolerated.  Goal is to try to attain exercise frequency to 5 times a week.  If applicable: Best to perform resistance exercises (machines or weights) 2 days a week and cardio type exercises 3 days per week.  counselled for 20 minutes on healthy eating choices and increasing activities to lose weight and exercise as above. Orders Placed This Encounter  Procedures  . Lipase  . Hepatic function panel  . Ambulatory referral to Home Health    Referral Priority:  Routine     Referral Type:  Home Health Care    Referral Reason:  Specialty Services Required    Requested Specialty:  [place]    Number of Visits Requested:  1   No orders of the defined types were placed in this encounter.   There are no discontinued medications. Return keep your follow up appointment, for Recheck medical problems, appt with Tammy on nutrition counselling.  Rhiana Morash P.  Jacelyn Grip, M.D.   03/27/2014 Had ordered for home pulse oximetry and her Sats drop to < 88% lowest is 59% recorded especially low at nights. Needs night time O2. Rx written today. Mykelle Cockerell P. Jacelyn Grip, M.D.

## 2014-02-27 NOTE — Patient Instructions (Signed)
      Dr Dianely Krehbiel's Recommendations  For nutrition information, I recommend books:  1).Eat to Live by Dr Joel Fuhrman. 2).Prevent and Reverse Heart Disease by Dr Caldwell Esselstyn. 3) Dr Neal Barnard's Book:  Program to Reverse Diabetes  Exercise recommendations are:  If unable to walk, then the patient can exercise in a chair 3 times a day. By flapping arms like a bird gently and raising legs outwards to the front.  If ambulatory, the patient can go for walks for 30 minutes 3 times a week. Then increase the intensity and duration as tolerated.  Goal is to try to attain exercise frequency to 5 times a week.  If applicable: Best to perform resistance exercises (machines or weights) 2 days a week and cardio type exercises 3 days per week.  

## 2014-02-28 LAB — HEPATIC FUNCTION PANEL
ALT: 56 IU/L — ABNORMAL HIGH (ref 0–32)
AST: 46 IU/L — ABNORMAL HIGH (ref 0–40)
Albumin: 4.7 g/dL (ref 3.5–5.5)
Alkaline Phosphatase: 61 IU/L (ref 39–117)
Bilirubin, Direct: 0.14 mg/dL (ref 0.00–0.40)
Total Bilirubin: 0.4 mg/dL (ref 0.0–1.2)
Total Protein: 7 g/dL (ref 6.0–8.5)

## 2014-02-28 LAB — LIPASE: Lipase: 51 U/L (ref 0–59)

## 2014-03-04 ENCOUNTER — Telehealth: Payer: Self-pay | Admitting: Family Medicine

## 2014-03-04 NOTE — Progress Notes (Signed)
Sent order for overnight pulse oximetry to [employer]

## 2014-03-10 ENCOUNTER — Encounter: Payer: Self-pay | Admitting: Pharmacist

## 2014-03-10 ENCOUNTER — Ambulatory Visit (INDEPENDENT_AMBULATORY_CARE_PROVIDER_SITE_OTHER): Payer: Medicare Other | Admitting: Pharmacist

## 2014-03-10 ENCOUNTER — Other Ambulatory Visit: Payer: Self-pay | Admitting: Pharmacist

## 2014-03-10 VITALS — BP 133/78 | HR 70 | Ht 65.0 in | Wt 325.0 lb

## 2014-03-10 DIAGNOSIS — K7689 Other specified diseases of liver: Secondary | ICD-10-CM

## 2014-03-10 DIAGNOSIS — E119 Type 2 diabetes mellitus without complications: Secondary | ICD-10-CM

## 2014-03-10 DIAGNOSIS — E785 Hyperlipidemia, unspecified: Secondary | ICD-10-CM

## 2014-03-10 DIAGNOSIS — E538 Deficiency of other specified B group vitamins: Secondary | ICD-10-CM

## 2014-03-10 DIAGNOSIS — I10 Essential (primary) hypertension: Secondary | ICD-10-CM

## 2014-03-10 DIAGNOSIS — K760 Fatty (change of) liver, not elsewhere classified: Secondary | ICD-10-CM

## 2014-03-10 MED ORDER — LINACLOTIDE 145 MCG PO CAPS: 145.0000 ug | ORAL_CAPSULE | Freq: Every day | ORAL | Status: DC

## 2014-03-10 NOTE — Progress Notes (Signed)
Lipid Clinic Consultation  Chief Complaint:   Chief Complaint  Patient presents with  . Hyperlipidemia     HPI:  Patient with hyperlipidemia. She was last seen several months ago and tried pravastatin but was unable to tolerate - causeed nausea.  According to patient and her sister she has tried several statins with extreme myalgias and or nausea - crestor, vytorin, atorvastatin, pravastatin, livalo and simvastatin.  She also tried Zetia 10mg  but after a month she though it was causing abdominal cramping and stomach pain and stopped Patient also has type 2 DM and related gastroparesis.   There is concern about the antipsychotic Seroquel that she is taking worsening metabolic problems and fatty liver.  She has not seen psychiatrist - Dr Reece Levy since 2012 and he is no longer at the office she use to go to.    Past Medical History  Diagnosis Date  . Unspecified hypothyroidism   . Constipation, chronic   . Vitamin B 12 deficiency   . Barrett's esophagus   . Osteoarthritis   . Gastroparesis   . Allergic rhinitis   . Chronic respiratory failure   . Migraine headache   . Hypertension   . Diabetes mellitus without complication   . GERD (gastroesophageal reflux disease)   . OSA (obstructive sleep apnea)     uses a cpap  . Sleep apnea     has c-pap  . Vitamin D deficiency       lipid  panel  Component Value Date/Time   LDL-P 2382 02/20/2014   LDL-C 129 02/20/2014   HDL-C 33 02/20/2014   TOTAL CHOL 219 02/20/2014   TRIG 485 02/20/2014   A1c was 6.7% (02/20/2014)   CHD/CHF Risk Equivalents:  DM Primary Problem(s):  LDL or LDL-P elevated and TG elevated  Current NCEP Goals: LDL Goal < 100 HDL Goal >/= 45 Tg Goal < 150 Non-HDL Goal < 130  Secondary cause of hyperlipidemia present:  DM and poor dietary habits Low fat diet followed?  No  Low carb diet followed?  No  Exercise?  No   Filed Vitals:   03/10/14 2255  BP: 133/78  Pulse: 70   Filed Weights   03/10/14 2255   Weight: 325 lb (147.419 kg)   Edema:  Trace bilaterally Respirations:  Normal, no wheezing noted   General Appearance:  alert, oriented, no acute distress and obese Mood/Affect:  normal    Recommendations: Changes in lipid medication(s):  Restart zetia 5mg  daily.  Consider changing seroquel (medium metabolic abnormalilites) to Abilify or Geodon which have lower associated risk of metabolic abnormalilites  Most of the visit was spent reviewing dietary changes to decrease cholesterol - lean proteins, increase whole grains and watch serving sizes (1/2 cup), increase non starchy vegetables and fresh fruits - handout given.  Encouraged increase exercise however able - important to move more.    Recheck Lipid Panel:  2 months   Cherre Robins, PharmD, CPP  Time spent counseling patient:  30 minutes PharmD:  Cherre Robins, PHARMD

## 2014-03-10 NOTE — Telephone Encounter (Signed)
Entered in error

## 2014-03-18 ENCOUNTER — Telehealth: Payer: Self-pay

## 2014-03-18 NOTE — Telephone Encounter (Signed)
Faxed over papers several times for Dr Jacelyn Grip to sign regarding pulse O2  New need order for nocturnal O2

## 2014-03-19 ENCOUNTER — Encounter: Payer: Self-pay | Admitting: General Practice

## 2014-03-19 ENCOUNTER — Ambulatory Visit (INDEPENDENT_AMBULATORY_CARE_PROVIDER_SITE_OTHER): Payer: Medicare Other | Admitting: General Practice

## 2014-03-19 VITALS — BP 123/77 | HR 69 | Temp 97.7°F | Ht 65.0 in | Wt 320.0 lb

## 2014-03-19 DIAGNOSIS — F319 Bipolar disorder, unspecified: Secondary | ICD-10-CM

## 2014-03-19 DIAGNOSIS — E785 Hyperlipidemia, unspecified: Secondary | ICD-10-CM

## 2014-03-19 DIAGNOSIS — K589 Irritable bowel syndrome without diarrhea: Secondary | ICD-10-CM

## 2014-03-19 MED ORDER — ARIPIPRAZOLE 15 MG PO TABS: 15.0000 mg | ORAL_TABLET | Freq: Every day | ORAL | Status: DC

## 2014-03-19 MED ORDER — LINACLOTIDE 145 MCG PO CAPS: 145.0000 ug | ORAL_CAPSULE | Freq: Every day | ORAL | Status: DC

## 2014-03-19 MED ORDER — EZETIMIBE 10 MG PO TABS: 5.0000 mg | ORAL_TABLET | Freq: Every day | ORAL | Status: DC

## 2014-03-19 NOTE — Progress Notes (Signed)
   Subjective:    Patient ID: Morgan [name], female    DOB: May 28, 1966, 48 y.o.   MRN: [id number]  HPI Patient presents today for follow up of medication change. She was given linezess and found it effective in managing IBS, would like to continue. Also taking zetia was able to tolerate 5 mg. Also change seroquel changed to abilify. Patient denies being seen by psychiatrist since 2012 due to insurance change.     Review of Systems  Constitutional: Negative for fever and chills.  Respiratory: Negative for chest tightness and shortness of breath.   Cardiovascular: Negative for chest pain and palpitations.  Psychiatric/Behavioral: Negative for suicidal ideas and self-injury. The patient is not nervous/anxious.        Bipolar disorder       Objective:   Physical Exam  Constitutional: She is oriented to person, place, and time. She appears well-developed and well-nourished.  Obese   Cardiovascular: Normal rate, regular rhythm and normal heart sounds.   Pulmonary/Chest: Effort normal and breath sounds normal. No respiratory distress. She exhibits no tenderness.  Neurological: She is alert and oriented to person, place, and time.  Skin: Skin is warm and dry.  Psychiatric: She has a normal mood and affect. Her behavior is normal.          Assessment & Plan:  1. Bipolar disorder, unspecified  - ARIPiprazole (ABILIFY) 15 MG tablet; Take 1 tablet (15 mg total) by mouth daily.  Dispense: 30 tablet; Refill: 1  2. Dyslipidemia  - ezetimibe (ZETIA) 10 MG tablet; Take 0.5 tablets (5 mg total) by mouth daily.  Dispense: 30 tablet; Refill: 5  3. IBS (irritable bowel syndrome)  - Linaclotide (LINZESS) 145 MCG CAPS capsule; Take 1 capsule (145 mcg total) by mouth daily.  Dispense: 30 capsule; Refill: 1 -provided list of counseling centers (patient to call) -RTO in 1 weeks for follow up medication change -may seek emergency medical treatment  Patient verbalized understanding Erby Pian, FNP-C

## 2014-03-19 NOTE — Patient Instructions (Addendum)
[facility]- Therapist [address] [place] ,[place] [address] [phone] Children limited to anxiety and depression- NO ADD/ADHD Does not accept Medicaid  [facility] [address] [place], [place] Does see children Does accept medicaid Will assess for Autism but not treat  [facility] [place]. [address] [place] [phone] Does see children  Does accept Medicaid Medication management- substance abuse- bipolar- grief- family-marriage- OCD- Anxiety- PTSD  [facility] Does see children Does accept medicaid They do perform psychological testing  [facility] [address] [place] Schedule through [facility]. [phone] Patient must call and make own appointment Does se children Does accept Medicaid  [facility] [place], [place] 7-10 accompanied by an adult, 11 and up by themselves Does accept Medicaid Will see patients with- substance abuse-ADHD-ADD-Bipolar-Domestic violence-Marriage counseling- Family Counseling and sexual abuse  [facility] [address], [place] [phone] Does see children Does accept [facility] [place] [phone]  Dr. Sabra Heck-  Psychiatrist [address], [place] Specializes in ADHD and addictions They do ADHD testing Suboxone clinic  [facility] [address] [place] Does Child psychological testing  [facility] [address] [place] [phone] Does Accept Medicaid Evaluates for Autism  [facility] [place] [phone] Does Not accept Medicaid Does do adult ADD  evaluations  Dr. Lorenza Evangelist [address] [place] [phone] Does not Take Medicaid Sees ADD and ADHD for treatment      [facility] [address], [place] [address] [phone] Takes Medicaid WIll see children as young as 3  Irritable Bowel Syndrome Irritable Bowel Syndrome (IBS) is caused by a disturbance of normal bowel function. Other terms used are spastic colon, mucous colitis, and irritable colon. It does not require surgery, nor does it lead to cancer. There is no cure for IBS. But with proper diet, stress reduction, and medication, you will find that your problems (symptoms) will gradually disappear or improve. IBS is a common digestive disorder. It usually appears in late adolescence or early adulthood. Women develop it twice as often as men. CAUSES  After food has been digested and absorbed in the small intestine, waste material is moved into the colon (large intestine). In the colon, water and salts are absorbed from the undigested products coming from the small intestine. The remaining residue, or fecal material, is held for elimination. Under normal circumstances, gentle, rhythmic contractions on the bowel walls push the fecal material along the colon towards the rectum. In IBS, however, these contractions are irregular and poorly coordinated. The fecal material is either retained too long, resulting in constipation, or expelled too soon, producing diarrhea. SYMPTOMS  The most common symptom of IBS is pain. It is typically in the lower left side of the belly (abdomen). But it may occur anywhere in the abdomen. It can be felt as heartburn, backache, or even as a dull pain in the arms or shoulders. The pain comes from excessive bowel-muscle spasms and from the buildup of gas and fecal material in the colon. This pain:  Can range from sharp belly (abdominal) cramps to a dull, continuous ache.  Usually worsens soon after eating.  Is  typically relieved by having a bowel movement or passing gas.  Abdominal pain is usually accompanied by constipation. But it may also produce diarrhea. The diarrhea typically occurs right after a meal or upon arising in the morning. The stools are typically soft and watery. They are often flecked with secretions (mucus). Other symptoms of IBS include:  Bloating.  Loss of appetite.  Heartburn.  Feeling sick to your stomach (nausea).  Belching  Vomiting  Gas. IBS may also cause a number of symptoms that are unrelated to the digestive system:  Fatigue.  Headaches.  Anxiety  Shortness of breath  Difficulty in concentrating.  Dizziness. These symptoms tend to come and go. DIAGNOSIS  The symptoms of IBS closely mimic the symptoms of other, more serious digestive disorders. So your caregiver may wish to perform a variety of additional tests to exclude these disorders. He/she wants to be certain of learning what is wrong (diagnosis). The nature and purpose of each test will be explained to you. TREATMENT A number of medications are available to help correct bowel function and/or relieve bowel spasms and abdominal pain. Among the drugs available are:  Mild, non-irritating laxatives for severe constipation and to help restore normal bowel habits.  Specific anti-diarrheal medications to treat severe or prolonged diarrhea.  Anti-spasmodic agents to relieve intestinal cramps.  Your caregiver may also decide to treat you with a mild tranquilizer or sedative during unusually stressful periods in your life. The important thing to remember is that if any drug is prescribed for you, make sure that you take it exactly as directed. Make sure that your caregiver knows how well it worked for you. HOME CARE INSTRUCTIONS   Avoid foods that are high in fat or oils. Some examples [facility]:QIONG cream, butter, frankfurters, sausage, and other fatty meats.  Avoid foods that have a laxative effect, such  as fruit, fruit juice, and dairy products.  Cut out carbonated drinks, chewing gum, and "gassy" foods, such as beans and cabbage. This may help relieve bloating and belching.  Bran taken with plenty of liquids may help relieve constipation.  Keep track of what foods seem to trigger your symptoms.  Avoid emotionally charged situations or circumstances that produce anxiety.  Start or continue exercising.  Get plenty of rest and sleep. MAKE SURE YOU:   Understand these instructions.  Will watch your condition.  Will get help right away if you are not doing well or get worse. Document Released: 11/28/2005 Document Revised: 02/20/2012 Document Reviewed: 07/18/2008 [facility] Patient Information 2014 [place].

## 2014-03-23 NOTE — Telephone Encounter (Signed)
Could we look for this please. thanks

## 2014-03-24 ENCOUNTER — Ambulatory Visit: Payer: Medicare Other | Admitting: Family Medicine

## 2014-03-26 LAB — HM DIABETES EYE EXAM

## 2014-03-28 ENCOUNTER — Telehealth: Payer: Self-pay | Admitting: General Practice

## 2014-03-28 NOTE — Telephone Encounter (Signed)
Patient had discussed possible changes from seroquel to abilify with clinical pharmacist on 03/10/14. Change was made on 03/19/14. Should allow more time (2 weeks) for effectiveness of medication. Patient was given list of counselors during [place] visit, has she contacted a psychiatrist for appointment, since last visit with one was in 2012. Patient needs to be followed by psychiatry. Make follow up appointment with this office (clinical pharmacist), to discuss medications. May seek emergency medical treatment as needed.

## 2014-03-31 NOTE — Telephone Encounter (Signed)
Patient aware.

## 2014-04-10 ENCOUNTER — Other Ambulatory Visit: Payer: Self-pay | Admitting: General Practice

## 2014-04-10 ENCOUNTER — Other Ambulatory Visit: Payer: Self-pay | Admitting: Family Medicine

## 2014-04-17 ENCOUNTER — Encounter: Payer: Self-pay | Admitting: Pharmacist

## 2014-04-17 ENCOUNTER — Ambulatory Visit (INDEPENDENT_AMBULATORY_CARE_PROVIDER_SITE_OTHER): Payer: Medicare Other | Admitting: Pharmacist

## 2014-04-17 VITALS — BP 134/82 | HR 78 | Ht 65.0 in | Wt 320.0 lb

## 2014-04-17 DIAGNOSIS — E538 Deficiency of other specified B group vitamins: Secondary | ICD-10-CM

## 2014-04-17 DIAGNOSIS — E559 Vitamin D deficiency, unspecified: Secondary | ICD-10-CM

## 2014-04-17 DIAGNOSIS — E8881 Metabolic syndrome: Secondary | ICD-10-CM

## 2014-04-17 DIAGNOSIS — K760 Fatty (change of) liver, not elsewhere classified: Secondary | ICD-10-CM

## 2014-04-17 DIAGNOSIS — K7689 Other specified diseases of liver: Secondary | ICD-10-CM

## 2014-04-17 DIAGNOSIS — E119 Type 2 diabetes mellitus without complications: Secondary | ICD-10-CM

## 2014-04-17 MED ORDER — ZIPRASIDONE HCL 60 MG PO CAPS: 60.0000 mg | ORAL_CAPSULE | Freq: Two times a day (BID) | ORAL | Status: DC

## 2014-04-17 NOTE — Progress Notes (Signed)
Lipid Clinic Consultation  Chief Complaint:   Chief Complaint  Patient presents with  . Hyperlipidemia     HPI:  Patient with hyperlipidemia. She was last seen several months ago and tried pravastatin but was unable to tolerate - causeed nausea.  According to patient and her sister she has tried several statins with extreme myalgias and or nausea - crestor, vytorin, atorvastatin, pravastatin, livalo and simvastatin.  She also tried Zetia 10m but after a month she though it was causing abdominal cramping and stomach pain and stopped.  At our last visit she agreed to retry Zetia 18m1/2 tablet daily and has been able to tolerate.   Patient also has type 2 DM and related gastroparesis.  Checks BG 1-2 times daily.  Average is 122.  Lowest = 108.  Highest = 166.  Patient was seen about 1 month ago by MaAleen Sellso change Seroquel because it was suspected that it could be worsening metabolic problems and fatty liver.  Mrs. Shinsky was changed to Abilify 1510maily.  Since change patient and her sister report that Mrs. Whitakers mood swings have been worse and also she is not sleeping well - only about 2-3 hours at a time.   She has not seen psychiatrist - Dr RedReece Levynce 2012 and he is no longer at the office she use to go to.    Past Medical History  Diagnosis Date  . Unspecified hypothyroidism   . Constipation, chronic   . Vitamin B 12 deficiency   . Barrett's esophagus   . Osteoarthritis   . Gastroparesis   . Allergic rhinitis   . Chronic respiratory failure   . Migraine headache   . Hypertension   . Diabetes mellitus without complication   . GERD (gastroesophageal reflux disease)   . OSA (obstructive sleep apnea)     uses a cpap  . Sleep apnea     has c-pap  . Vitamin D deficiency   . Bipolar affective disorder       lipid  panel  Component Value Date/Time   LDL-P 2382 02/20/2014   LDL-C 129 02/20/2014   HDL-C 33 02/20/2014   TOTAL CHOL 219 02/20/2014   TRIG 485  02/20/2014   A1c was 6.7% (02/20/2014)   Secondary cause of hyperlipidemia present:  antipsychoitic medication, DM and poor dietary habits Low fat diet followed?  Has greatly improved - not more fried foods and has increased vegetable intake Low carb diet followed?  Has increased non starchy vegetables over last month Exercise?  No - but planning to start riding stationary bike    Filed Vitals:   04/17/14 0844  BP: 134/82  Pulse: 78   Filed Weights   04/17/14 0844  Weight: 320 lb (145.151 kg)   Edema:  Trace bilaterally Respirations:  Normal, no wheezing noted   General Appearance:  alert, oriented, no acute distress and obese Mood/Affect:  normal  Assessment:  Dyslipidemia - complicated by DM and antipsychotic medications side effects  CHD/CHF Risk Equivalents:  DM  Primary Problem(s):  LDL or LDL-P elevated and TG elevated  Current NCEP Goals:  LDL Goal < 100,  HDL Goal >/= 45, Tg Goal < 150 and Non-HDL Goal < 130 Type 2 DM Fatty liver Vitamin D deficiency  Recommendations: Changes in lipid medication(s):  Continue zetia 5mg27mily  Discussed bipolar with Dr MoorLaurance Flatteniscontinue Abilify and start Geodon 60mg6mapsule BID with food.   Patient reminded to call office if has thoughts  of harming self or someone else or if not experiencing improvement in mood or sleep  Continue to check BG 1-2 times daily Continue to limit CHO serving sizes, increase lean proteins, increase whole grains, increase non starchy vegetables Encouraged increase exercise however able - stationary bikes is great way to start Above should help with weight loss - goal 25# Patient will RTC in 1 month to have labs rechecked - future orders placed today  Orders Placed This Encounter  Procedures  . BMP8+EGFR    Standing Status: Future     Number of Occurrences:      Standing Expiration Date: 06/17/2014  . NMR, lipoprofile    Standing Status: Future     Number of Occurrences:      Standing Expiration  Date: 06/17/2014  . Hepatic function panel    Standing Status: Future     Number of Occurrences:      Standing Expiration Date: 06/17/2014  . Lipase    Standing Status: Future     Number of Occurrences:      Standing Expiration Date: 06/17/2014  . Vit D  25 hydroxy (rtn osteoporosis monitoring)    Standing Status: Future     Number of Occurrences:      Standing Expiration Date: 06/02/2014  . POCT glycosylated hemoglobin (Hb A1C)    Standing Status: Future     Number of Occurrences:      Standing Expiration Date: 06/18/2015      Time spent counseling patient:  30 minutes PharmD:  Cherre Robins, PHARMD

## 2014-05-12 ENCOUNTER — Other Ambulatory Visit: Payer: Self-pay | Admitting: General Practice

## 2014-05-15 ENCOUNTER — Telehealth: Payer: Self-pay | Admitting: Family Medicine

## 2014-05-15 DIAGNOSIS — F319 Bipolar disorder, unspecified: Secondary | ICD-10-CM

## 2014-05-15 MED ORDER — ZIPRASIDONE HCL 60 MG PO CAPS: 60.0000 mg | ORAL_CAPSULE | Freq: Every evening | ORAL | Status: DC

## 2014-05-15 MED ORDER — SERTRALINE HCL 100 MG PO TABS: 200.0000 mg | ORAL_TABLET | Freq: Every day | ORAL | Status: DC

## 2014-05-15 NOTE — Telephone Encounter (Signed)
Patient complains of increase fatigue and sleeping during day since starting Geodon.  She is sleeping better at night but still some mood swings.  Recommended increase sertraline to 200mg  daily and decrease Geodon to 80mg  just at night.  RTC in 5 days to have b12 will check on mood at that time.

## 2014-05-19 ENCOUNTER — Ambulatory Visit (INDEPENDENT_AMBULATORY_CARE_PROVIDER_SITE_OTHER): Payer: Medicare Other | Admitting: *Deleted

## 2014-05-19 DIAGNOSIS — E538 Deficiency of other specified B group vitamins: Secondary | ICD-10-CM

## 2014-05-19 NOTE — Progress Notes (Signed)
Vitamin b12 given and tolerated well. 

## 2014-05-19 NOTE — Patient Instructions (Signed)
Vitamin B12 Injections Every person needs vitamin B12. A deficiency develops when the body does not get enough of it. One way to overcome this is by getting B12 shots (injections). A B12 shot puts the vitamin directly into muscle tissue. This avoids any problems your body might have in absorbing it from food or a pill. In some people, the body has trouble using the vitamin correctly. This can cause a B12 deficiency. Not consuming enough of the vitamin can also cause a deficiency. Getting enough vitamin B12 can be hard for elderly people. Sometimes, they do not eat a well-balanced diet. The elderly are also more likely than younger people to have medical conditions or take medications that can lead to a deficiency. WHAT DOES VITAMIN B12 DO? Vitamin B12 does many things to help the body work right:  It helps the body make healthy red blood cells.  It helps maintain nerve cells.  It is involved in the body's process of converting food into energy (metabolism).  It is needed to make the genetic material in all cells (DNA). VITAMIN B12 FOOD SOURCES Most people get plenty of vitamin B12 through the foods they eat. It is present in:  Meat, fish, poultry, and eggs.  Milk and milk products.  It also is added when certain foods are made, including some breads, cereals and yogurts. The food is then called "fortified". CAUSES The most common causes of vitamin B12 deficiency are:  Pernicious anemia. The condition develops when the body cannot make enough healthy red blood cells. This stems from a lack of a protein made in the stomach (intrinsic factor). People without this protein cannot absorb enough vitamin B12 from food.  Malabsorption. This is when the body cannot absorb the vitamin. It can be caused by:  Pernicious anemia.  Surgery to remove part or all of the stomach can lead to malabsorption. Removal of part or all of the small intestine can also cause malabsorption.  Vegetarian diet.  People who are strict about not eating foods from animals could have trouble taking in enough vitamin B12 from diet alone.  Medications. Some medicines have been linked to B12 deficiency, such as Metformin (a drug prescribed for type 2 diabetes). Long-term use of stomach acid suppressants also can keep the vitamin from being absorbed.  Intestinal problems such as inflammatory bowel disease. If there are problems in the digestive tract, vitamin B12 may not be absorbed in good enough amounts. SYMPTOMS People who do not get enough B12 can develop problems. These can include:  Anemia. This is when the body has too few red blood cells. Red blood cells carry oxygen to the rest of the body. Without a healthy supply of red blood cells, people can feel:  Tired (fatigued).  Weak.  Severe anemia can cause:  Shortness of breath.  Dizziness.  Rapid heart rate.  Paleness.  Other Vitamin B12 deficiency symptoms include:  Diarrhea.  Numbness or tingling in the hands or feet.  Loss of appetite.  Confusion.  Sores on the tongue or in the mouth. LET YOUR CAREGIVER KNOW ABOUT:  Any allergies. It is very important to know if you are allergic or sensitive to cobalt. Vitamin B12 contains cobalt.  Any history of kidney disease.  All medications you are taking. Include prescription and over-the-counter medicines, herbs and creams.  Whether you are pregnant or breast-feeding.  If you have Leber's disease, a hereditary eye condition, vitamin B12 could make it worse. RISKS AND COMPLICATIONS Reactions to an injection are   usually temporary. They might include:  Pain at the injection site.  Redness, swelling or tenderness at the site.  Headache, dizziness or weakness.  Nausea, upset stomach or diarrhea.  Numbness or tingling.  Fever.  Joint pain.  Itching or rash. If a reaction does not go away in a short while, talk with your healthcare provider. A change in the way the shots are  given, or where they are given, might need to be made. BEFORE AN INJECTION To decide whether B12 injections are right for you, your healthcare provider will probably:  Ask about your medical history.  Ask questions about your diet.  Ask about symptoms such as:  Have you felt weak?  Do you feel unusually tired?  Do you get dizzy?  Order blood tests. These may include a test to:  Check the level of red cells in your blood.  Measure B12 levels.  Check for the presence of intrinsic factor. VITAMIN B12 INJECTIONS How often you will need a vitamin B12 injection will depend on how severe your deficiency is. This also will affect how long you will need to get them. People with pernicious anemia usually get injections for their entire life. Others might get them for a shorter period. For many people, injections are given daily or weekly for several weeks. Then, once B12 levels are normal, injections are given just once a month. If the cause of the deficiency can be fixed, the injections can be stopped. Talk with your healthcare provider about what you should expect. For an injection:  The injection site will be cleaned with an alcohol swab.  Your healthcare provider will insert a needle directly into a muscle. Most any muscle can be used. Most often, an arm muscle is used. A buttocks muscle can also be used. Many people say shots in that area are less painful.  A small adhesive bandage may be put over the injection site. It usually can be taken off in an hour or less. Injections can be given by your healthcare provider. In some cases, family members give them. Sometimes, people give them to themselves. Talk with your healthcare provider about what would be best for you. If someone other than your healthcare provider will be giving the shots, the person will need to be trained to give them correctly. HOME CARE INSTRUCTIONS   You can remove the adhesive bandage within an hour of getting a  shot.  You should be able to go about your normal activities right away.  Avoid drinking large amounts of alcohol while taking vitamin B12 shots. Alcohol can interfere with the body's use of the vitamin. SEEK MEDICAL CARE IF:   Pain, redness, swelling or tenderness at the injection site does not get better or gets worse.  Headache, dizziness or weakness does not go away.  You develop a fever of more than 100.5 F (38.1 C). SEEK IMMEDIATE MEDICAL CARE IF:   You have chest pain.  You develop shortness of breath.  You have muscle weakness that gets worse.  You develop numbness, weakness or tingling on one side or one area of the body.  You have symptoms of an allergic reaction, such as:  Hives.  Difficulty breathing.  Swelling of the lips, face, tongue or throat.  You develop a fever of more than 102.0 F (38.9 C). MAKE SURE YOU:   Understand these instructions.  Will watch your condition.  Will get help right away if you are not doing well or get worse. Document   Released: 02/24/2009 Document Revised: 02/20/2012 Document Reviewed: 02/24/2009 [employer] Patient Information 2014 [employer].  

## 2014-05-23 ENCOUNTER — Other Ambulatory Visit (INDEPENDENT_AMBULATORY_CARE_PROVIDER_SITE_OTHER): Payer: Medicare Other

## 2014-05-23 DIAGNOSIS — K760 Fatty (change of) liver, not elsewhere classified: Secondary | ICD-10-CM

## 2014-05-23 DIAGNOSIS — E8881 Metabolic syndrome: Secondary | ICD-10-CM

## 2014-05-23 DIAGNOSIS — E119 Type 2 diabetes mellitus without complications: Secondary | ICD-10-CM

## 2014-05-23 LAB — POCT GLYCOSYLATED HEMOGLOBIN (HGB A1C): Hemoglobin A1C: 6.4

## 2014-05-23 NOTE — Progress Notes (Unsigned)
Patient came in for labs only.

## 2014-05-24 LAB — NMR, LIPOPROFILE
CHOLESTEROL: 186 mg/dL (ref 100–199)
HDL Cholesterol by NMR: 34 mg/dL — ABNORMAL LOW (ref >39)
HDL Particle Number: 32.9 umol/L (ref >=30.5)
LDL Particle Number: 1554 nmol/L — ABNORMAL HIGH (ref <1000)
LDL Size: 20.1 nm (ref >20.5)
LDLC SERPL CALC-MCNC: 105 mg/dL — AB (ref 0–99)
LP-IR SCORE: 92 — AB (ref <=45)
SMALL LDL PARTICLE NUMBER: 1065 nmol/L — AB (ref <=527)
Triglycerides by NMR: 233 mg/dL — ABNORMAL HIGH (ref 0–149)

## 2014-05-24 LAB — BMP8+EGFR
BUN / CREAT RATIO: 25 — AB (ref 9–23)
BUN: 20 mg/dL (ref 6–24)
CHLORIDE: 98 mmol/L (ref 97–108)
CO2: 28 mmol/L (ref 18–29)
Calcium: 9.3 mg/dL (ref 8.7–10.2)
Creatinine, Ser: 0.81 mg/dL (ref 0.57–1.00)
GFR calc non Af Amer: 87 mL/min/{1.73_m2} (ref >59)
GFR, EST AFRICAN AMERICAN: 101 mL/min/{1.73_m2} (ref >59)
Glucose: 113 mg/dL — ABNORMAL HIGH (ref 65–99)
POTASSIUM: 4.2 mmol/L (ref 3.5–5.2)
SODIUM: 141 mmol/L (ref 134–144)

## 2014-05-24 LAB — HEPATIC FUNCTION PANEL
ALT: 26 IU/L (ref 0–32)
AST: 25 IU/L (ref 0–40)
Albumin: 4.1 g/dL (ref 3.5–5.5)
Alkaline Phosphatase: 58 IU/L (ref 39–117)
BILIRUBIN DIRECT: 0.12 mg/dL (ref 0.00–0.40)
Total Bilirubin: 0.2 mg/dL (ref 0.0–1.2)
Total Protein: 6.2 g/dL (ref 6.0–8.5)

## 2014-05-24 LAB — VITAMIN D 25 HYDROXY (VIT D DEFICIENCY, FRACTURES): Vit D, 25-Hydroxy: 35.5 ng/mL (ref 30.0–100.0)

## 2014-05-24 LAB — LIPASE: LIPASE: 52 U/L (ref 0–59)

## 2014-05-26 ENCOUNTER — Telehealth: Payer: Self-pay | Admitting: Pharmacist

## 2014-05-26 NOTE — Telephone Encounter (Signed)
Patient called about recent labs which were much improved.  No changes in medications recommended.  Patien to follow up in 3 months

## 2014-06-16 ENCOUNTER — Telehealth: Payer: Self-pay | Admitting: Family Medicine

## 2014-06-16 ENCOUNTER — Other Ambulatory Visit: Payer: Self-pay | Admitting: Family Medicine

## 2014-06-16 DIAGNOSIS — I10 Essential (primary) hypertension: Secondary | ICD-10-CM

## 2014-06-16 DIAGNOSIS — E785 Hyperlipidemia, unspecified: Secondary | ICD-10-CM

## 2014-06-16 MED ORDER — VALSARTAN-HYDROCHLOROTHIAZIDE 160-25 MG PO TABS: ORAL_TABLET | ORAL | Status: DC

## 2014-06-16 MED ORDER — FENOFIBRATE MICRONIZED 130 MG PO CAPS: ORAL_CAPSULE | ORAL | Status: DC

## 2014-06-16 NOTE — Telephone Encounter (Signed)
done

## 2014-06-18 NOTE — Telephone Encounter (Signed)
Patient of Mae and Dr Jacelyn Grip. Please advise on refill

## 2014-06-18 NOTE — Telephone Encounter (Signed)
Please have patient make an appointment for followup with a provider We will refill her prescription

## 2014-06-19 ENCOUNTER — Ambulatory Visit (INDEPENDENT_AMBULATORY_CARE_PROVIDER_SITE_OTHER): Payer: Medicare Other | Admitting: *Deleted

## 2014-06-19 DIAGNOSIS — E538 Deficiency of other specified B group vitamins: Secondary | ICD-10-CM

## 2014-06-19 NOTE — Progress Notes (Signed)
Vitamin b12 given and tolerated well. 

## 2014-06-19 NOTE — Patient Instructions (Signed)
Vitamin B12 Injections Every person needs vitamin B12. A deficiency develops when the body does not get enough of it. One way to overcome this is by getting B12 shots (injections). A B12 shot puts the vitamin directly into muscle tissue. This avoids any problems your body might have in absorbing it from food or a pill. In some people, the body has trouble using the vitamin correctly. This can cause a B12 deficiency. Not consuming enough of the vitamin can also cause a deficiency. Getting enough vitamin B12 can be hard for elderly people. Sometimes, they do not eat a well-balanced diet. The elderly are also more likely than younger people to have medical conditions or take medications that can lead to a deficiency. WHAT DOES VITAMIN B12 DO? Vitamin B12 does many things to help the body work right:  It helps the body make healthy red blood cells.  It helps maintain nerve cells.  It is involved in the body's process of converting food into energy (metabolism).  It is needed to make the genetic material in all cells (DNA). VITAMIN B12 FOOD SOURCES Most people get plenty of vitamin B12 through the foods they eat. It is present in:  Meat, fish, poultry, and eggs.  Milk and milk products.  It also is added when certain foods are made, including some breads, cereals and yogurts. The food is then called "fortified". CAUSES The most common causes of vitamin B12 deficiency are:  Pernicious anemia. The condition develops when the body cannot make enough healthy red blood cells. This stems from a lack of a protein made in the stomach (intrinsic factor). People without this protein cannot absorb enough vitamin B12 from food.  Malabsorption. This is when the body cannot absorb the vitamin. It can be caused by:  Pernicious anemia.  Surgery to remove part or all of the stomach can lead to malabsorption. Removal of part or all of the small intestine can also cause malabsorption.  Vegetarian diet.  People who are strict about not eating foods from animals could have trouble taking in enough vitamin B12 from diet alone.  Medications. Some medicines have been linked to B12 deficiency, such as Metformin (a drug prescribed for type 2 diabetes). Long-term use of stomach acid suppressants also can keep the vitamin from being absorbed.  Intestinal problems such as inflammatory bowel disease. If there are problems in the digestive tract, vitamin B12 may not be absorbed in good enough amounts. SYMPTOMS People who do not get enough B12 can develop problems. These can include:  Anemia. This is when the body has too few red blood cells. Red blood cells carry oxygen to the rest of the body. Without a healthy supply of red blood cells, people can feel:  Tired (fatigued).  Weak.  Severe anemia can cause:  Shortness of breath.  Dizziness.  Rapid heart rate.  Paleness.  Other Vitamin B12 deficiency symptoms include:  Diarrhea.  Numbness or tingling in the hands or feet.  Loss of appetite.  Confusion.  Sores on the tongue or in the mouth. LET YOUR CAREGIVER KNOW ABOUT:  Any allergies. It is very important to know if you are allergic or sensitive to cobalt. Vitamin B12 contains cobalt.  Any history of kidney disease.  All medications you are taking. Include prescription and over-the-counter medicines, herbs and creams.  Whether you are pregnant or breast-feeding.  If you have Leber's disease, a hereditary eye condition, vitamin B12 could make it worse. RISKS AND COMPLICATIONS Reactions to an injection are   usually temporary. They might include:  Pain at the injection site.  Redness, swelling or tenderness at the site.  Headache, dizziness or weakness.  Nausea, upset stomach or diarrhea.  Numbness or tingling.  Fever.  Joint pain.  Itching or rash. If a reaction does not go away in a short while, talk with your healthcare provider. A change in the way the shots are  given, or where they are given, might need to be made. BEFORE AN INJECTION To decide whether B12 injections are right for you, your healthcare provider will probably:  Ask about your medical history.  Ask questions about your diet.  Ask about symptoms such as:  Have you felt weak?  Do you feel unusually tired?  Do you get dizzy?  Order blood tests. These may include a test to:  Check the level of red cells in your blood.  Measure B12 levels.  Check for the presence of intrinsic factor. VITAMIN B12 INJECTIONS How often you will need a vitamin B12 injection will depend on how severe your deficiency is. This also will affect how long you will need to get them. People with pernicious anemia usually get injections for their entire life. Others might get them for a shorter period. For many people, injections are given daily or weekly for several weeks. Then, once B12 levels are normal, injections are given just once a month. If the cause of the deficiency can be fixed, the injections can be stopped. Talk with your healthcare provider about what you should expect. For an injection:  The injection site will be cleaned with an alcohol swab.  Your healthcare provider will insert a needle directly into a muscle. Most any muscle can be used. Most often, an arm muscle is used. A buttocks muscle can also be used. Many people say shots in that area are less painful.  A small adhesive bandage may be put over the injection site. It usually can be taken off in an hour or less. Injections can be given by your healthcare provider. In some cases, family members give them. Sometimes, people give them to themselves. Talk with your healthcare provider about what would be best for you. If someone other than your healthcare provider will be giving the shots, the person will need to be trained to give them correctly. HOME CARE INSTRUCTIONS   You can remove the adhesive bandage within an hour of getting a  shot.  You should be able to go about your normal activities right away.  Avoid drinking large amounts of alcohol while taking vitamin B12 shots. Alcohol can interfere with the body's use of the vitamin. SEEK MEDICAL CARE IF:   Pain, redness, swelling or tenderness at the injection site does not get better or gets worse.  Headache, dizziness or weakness does not go away.  You develop a fever of more than 100.5 F (38.1 C). SEEK IMMEDIATE MEDICAL CARE IF:   You have chest pain.  You develop shortness of breath.  You have muscle weakness that gets worse.  You develop numbness, weakness or tingling on one side or one area of the body.  You have symptoms of an allergic reaction, such as:  Hives.  Difficulty breathing.  Swelling of the lips, face, tongue or throat.  You develop a fever of more than 102.0 F (38.9 C). MAKE SURE YOU:   Understand these instructions.  Will watch your condition.  Will get help right away if you are not doing well or get worse. Document   Released: 02/24/2009 Document Revised: 02/20/2012 Document Reviewed: 02/24/2009 [employer] Patient Information 2015 [employer]. This information is not intended to replace advice given to you by your health care provider. Make sure you discuss any questions you have with your health care provider.  

## 2014-06-23 ENCOUNTER — Other Ambulatory Visit: Payer: Self-pay | Admitting: Family Medicine

## 2014-07-03 ENCOUNTER — Telehealth: Payer: Self-pay | Admitting: General Practice

## 2014-07-04 MED ORDER — METFORMIN HCL 500 MG PO TABS: 500.0000 mg | ORAL_TABLET | Freq: Every evening | ORAL | Status: DC

## 2014-07-04 NOTE — Telephone Encounter (Signed)
Med refilled   Patient aware 

## 2014-07-12 ENCOUNTER — Other Ambulatory Visit: Payer: Self-pay | Admitting: Nurse Practitioner

## 2014-07-15 ENCOUNTER — Other Ambulatory Visit: Payer: Self-pay | Admitting: *Deleted

## 2014-07-15 DIAGNOSIS — E876 Hypokalemia: Secondary | ICD-10-CM

## 2014-07-15 NOTE — Telephone Encounter (Signed)
Last ov 4/15. Last K+ 4.2 on 05/23/14.

## 2014-07-16 MED ORDER — ETODOLAC 500 MG PO TABS: 500.0000 mg | ORAL_TABLET | Freq: Two times a day (BID) | ORAL | Status: DC

## 2014-07-16 MED ORDER — POTASSIUM CHLORIDE CRYS ER 10 MEQ PO TBCR: 10.0000 meq | EXTENDED_RELEASE_TABLET | Freq: Every day | ORAL | Status: DC

## 2014-07-18 ENCOUNTER — Other Ambulatory Visit: Payer: Self-pay

## 2014-07-18 MED ORDER — PRAVASTATIN SODIUM 20 MG PO TABS: 20.0000 mg | ORAL_TABLET | Freq: Every day | ORAL | Status: DC

## 2014-07-18 MED ORDER — ETODOLAC 500 MG PO TABS: 500.0000 mg | ORAL_TABLET | Freq: Two times a day (BID) | ORAL | Status: DC

## 2014-07-21 ENCOUNTER — Ambulatory Visit (INDEPENDENT_AMBULATORY_CARE_PROVIDER_SITE_OTHER): Payer: Medicare Other | Admitting: *Deleted

## 2014-07-21 DIAGNOSIS — E538 Deficiency of other specified B group vitamins: Secondary | ICD-10-CM

## 2014-07-21 NOTE — Progress Notes (Signed)
Vitamin b12 given and tolerated well. 

## 2014-07-21 NOTE — Patient Instructions (Signed)
Vitamin B12 Injections Every person needs vitamin B12. A deficiency develops when the body does not get enough of it. One way to overcome this is by getting B12 shots (injections). A B12 shot puts the vitamin directly into muscle tissue. This avoids any problems your body might have in absorbing it from food or a pill. In some people, the body has trouble using the vitamin correctly. This can cause a B12 deficiency. Not consuming enough of the vitamin can also cause a deficiency. Getting enough vitamin B12 can be hard for elderly people. Sometimes, they do not eat a well-balanced diet. The elderly are also more likely than younger people to have medical conditions or take medications that can lead to a deficiency. WHAT DOES VITAMIN B12 DO? Vitamin B12 does many things to help the body work right:  It helps the body make healthy red blood cells.  It helps maintain nerve cells.  It is involved in the body's process of converting food into energy (metabolism).  It is needed to make the genetic material in all cells (DNA). VITAMIN B12 FOOD SOURCES Most people get plenty of vitamin B12 through the foods they eat. It is present in:  Meat, fish, poultry, and eggs.  Milk and milk products.  It also is added when certain foods are made, including some breads, cereals and yogurts. The food is then called "fortified". CAUSES The most common causes of vitamin B12 deficiency are:  Pernicious anemia. The condition develops when the body cannot make enough healthy red blood cells. This stems from a lack of a protein made in the stomach (intrinsic factor). People without this protein cannot absorb enough vitamin B12 from food.  Malabsorption. This is when the body cannot absorb the vitamin. It can be caused by:  Pernicious anemia.  Surgery to remove part or all of the stomach can lead to malabsorption. Removal of part or all of the small intestine can also cause malabsorption.  Vegetarian diet.  People who are strict about not eating foods from animals could have trouble taking in enough vitamin B12 from diet alone.  Medications. Some medicines have been linked to B12 deficiency, such as Metformin (a drug prescribed for type 2 diabetes). Long-term use of stomach acid suppressants also can keep the vitamin from being absorbed.  Intestinal problems such as inflammatory bowel disease. If there are problems in the digestive tract, vitamin B12 may not be absorbed in good enough amounts. SYMPTOMS People who do not get enough B12 can develop problems. These can include:  Anemia. This is when the body has too few red blood cells. Red blood cells carry oxygen to the rest of the body. Without a healthy supply of red blood cells, people can feel:  Tired (fatigued).  Weak.  Severe anemia can cause:  Shortness of breath.  Dizziness.  Rapid heart rate.  Paleness.  Other Vitamin B12 deficiency symptoms include:  Diarrhea.  Numbness or tingling in the hands or feet.  Loss of appetite.  Confusion.  Sores on the tongue or in the mouth. LET YOUR CAREGIVER KNOW ABOUT:  Any allergies. It is very important to know if you are allergic or sensitive to cobalt. Vitamin B12 contains cobalt.  Any history of kidney disease.  All medications you are taking. Include prescription and over-the-counter medicines, herbs and creams.  Whether you are pregnant or breast-feeding.  If you have Leber's disease, a hereditary eye condition, vitamin B12 could make it worse. RISKS AND COMPLICATIONS Reactions to an injection are   usually temporary. They might include:  Pain at the injection site.  Redness, swelling or tenderness at the site.  Headache, dizziness or weakness.  Nausea, upset stomach or diarrhea.  Numbness or tingling.  Fever.  Joint pain.  Itching or rash. If a reaction does not go away in a short while, talk with your healthcare provider. A change in the way the shots are  given, or where they are given, might need to be made. BEFORE AN INJECTION To decide whether B12 injections are right for you, your healthcare provider will probably:  Ask about your medical history.  Ask questions about your diet.  Ask about symptoms such as:  Have you felt weak?  Do you feel unusually tired?  Do you get dizzy?  Order blood tests. These may include a test to:  Check the level of red cells in your blood.  Measure B12 levels.  Check for the presence of intrinsic factor. VITAMIN B12 INJECTIONS How often you will need a vitamin B12 injection will depend on how severe your deficiency is. This also will affect how long you will need to get them. People with pernicious anemia usually get injections for their entire life. Others might get them for a shorter period. For many people, injections are given daily or weekly for several weeks. Then, once B12 levels are normal, injections are given just once a month. If the cause of the deficiency can be fixed, the injections can be stopped. Talk with your healthcare provider about what you should expect. For an injection:  The injection site will be cleaned with an alcohol swab.  Your healthcare provider will insert a needle directly into a muscle. Most any muscle can be used. Most often, an arm muscle is used. A buttocks muscle can also be used. Many people say shots in that area are less painful.  A small adhesive bandage may be put over the injection site. It usually can be taken off in an hour or less. Injections can be given by your healthcare provider. In some cases, family members give them. Sometimes, people give them to themselves. Talk with your healthcare provider about what would be best for you. If someone other than your healthcare provider will be giving the shots, the person will need to be trained to give them correctly. HOME CARE INSTRUCTIONS   You can remove the adhesive bandage within an hour of getting a  shot.  You should be able to go about your normal activities right away.  Avoid drinking large amounts of alcohol while taking vitamin B12 shots. Alcohol can interfere with the body's use of the vitamin. SEEK MEDICAL CARE IF:   Pain, redness, swelling or tenderness at the injection site does not get better or gets worse.  Headache, dizziness or weakness does not go away.  You develop a fever of more than 100.5 F (38.1 C). SEEK IMMEDIATE MEDICAL CARE IF:   You have chest pain.  You develop shortness of breath.  You have muscle weakness that gets worse.  You develop numbness, weakness or tingling on one side or one area of the body.  You have symptoms of an allergic reaction, such as:  Hives.  Difficulty breathing.  Swelling of the lips, face, tongue or throat.  You develop a fever of more than 102.0 F (38.9 C). MAKE SURE YOU:   Understand these instructions.  Will watch your condition.  Will get help right away if you are not doing well or get worse. Document   Released: 02/24/2009 Document Revised: 02/20/2012 Document Reviewed: 02/24/2009 [employer] Patient Information 2015 [employer]. This information is not intended to replace advice given to you by your health care provider. Make sure you discuss any questions you have with your health care provider.  

## 2014-07-30 ENCOUNTER — Ambulatory Visit (INDEPENDENT_AMBULATORY_CARE_PROVIDER_SITE_OTHER): Payer: Medicare Other | Admitting: Pulmonary Disease

## 2014-07-30 ENCOUNTER — Encounter: Payer: Self-pay | Admitting: Pulmonary Disease

## 2014-07-30 VITALS — BP 120/80 | HR 67 | Temp 98.4°F | Ht 67.0 in | Wt 328.0 lb

## 2014-07-30 DIAGNOSIS — G4733 Obstructive sleep apnea (adult) (pediatric): Secondary | ICD-10-CM

## 2014-07-30 NOTE — Progress Notes (Signed)
   Subjective:    Patient ID: Morgan [name], female    DOB: 1966/07/11, 48 y.o.   MRN: [id number]  HPI The patient comes in today for followup of her obstructive sleep apnea. She is wearing CPAP compliantly, and is having no issues with her mask fit or pressure. She feels that she is sleeping well with the device, and is satisfied with her sleep and daytime alertness. Of note, her weight is up 12 pounds since last visit.   Review of Systems  Constitutional: Negative for fever and unexpected weight change.  HENT: Negative for congestion, dental problem, ear pain, nosebleeds, postnasal drip, rhinorrhea, sinus pressure, sneezing, sore throat and trouble swallowing.   Eyes: Negative for redness and itching.  Respiratory: Negative for cough, chest tightness, shortness of breath and wheezing.   Cardiovascular: Negative for palpitations and leg swelling.  Gastrointestinal: Negative for nausea and vomiting.  Genitourinary: Negative for dysuria.  Musculoskeletal: Negative for joint swelling.  Skin: Negative for rash.  Neurological: Negative for headaches.  Hematological: Does not bruise/bleed easily.  Psychiatric/Behavioral: Negative for dysphoric mood. The patient is not nervous/anxious.        Objective:   Physical Exam Morbidly obese female in no acute distress Nose without purulence or discharge noted Neck without lymphadenopathy or thyromegaly No skin breakdown or pressure necrosis from the CPAP mask Lower extremities with mild edema, no cyanosis Alert and oriented, moves all 4 extremities.       Assessment & Plan:

## 2014-07-30 NOTE — Assessment & Plan Note (Signed)
The patient is doing very well with CPAP, and is having no issues with her equipment or her sleep. I have encouraged her to stay on her device with nocturnal oxygen, and to work aggressively on weight loss. I've also asked her to consider bariatric surgery. I will see her back in one year if doing well.

## 2014-07-30 NOTE — Patient Instructions (Signed)
Stay on cpap and keep up with mask changes and supplies. Work on weight loss followup with me again in one year if doing well.

## 2014-08-08 ENCOUNTER — Other Ambulatory Visit: Payer: Self-pay | Admitting: General Practice

## 2014-08-19 ENCOUNTER — Other Ambulatory Visit: Payer: Self-pay | Admitting: Family Medicine

## 2014-08-19 NOTE — Telephone Encounter (Signed)
Last ov 5/15 with tammy.

## 2014-08-22 ENCOUNTER — Ambulatory Visit (INDEPENDENT_AMBULATORY_CARE_PROVIDER_SITE_OTHER): Payer: Medicare Other

## 2014-08-22 DIAGNOSIS — E538 Deficiency of other specified B group vitamins: Secondary | ICD-10-CM

## 2014-08-29 ENCOUNTER — Ambulatory Visit (INDEPENDENT_AMBULATORY_CARE_PROVIDER_SITE_OTHER): Payer: Medicare Other | Admitting: Pharmacist

## 2014-08-29 ENCOUNTER — Encounter: Payer: Self-pay | Admitting: Pharmacist

## 2014-08-29 VITALS — BP 116/72 | HR 66 | Ht 67.0 in | Wt 324.5 lb

## 2014-08-29 DIAGNOSIS — E8881 Metabolic syndrome: Secondary | ICD-10-CM

## 2014-08-29 DIAGNOSIS — Z0000 Encounter for general adult medical examination without abnormal findings: Secondary | ICD-10-CM

## 2014-08-29 DIAGNOSIS — K760 Fatty (change of) liver, not elsewhere classified: Secondary | ICD-10-CM

## 2014-08-29 DIAGNOSIS — E119 Type 2 diabetes mellitus without complications: Secondary | ICD-10-CM

## 2014-08-29 DIAGNOSIS — E785 Hyperlipidemia, unspecified: Secondary | ICD-10-CM

## 2014-08-29 DIAGNOSIS — L84 Corns and callosities: Secondary | ICD-10-CM

## 2014-08-29 DIAGNOSIS — E538 Deficiency of other specified B group vitamins: Secondary | ICD-10-CM

## 2014-08-29 LAB — POCT GLYCOSYLATED HEMOGLOBIN (HGB A1C): HEMOGLOBIN A1C: 6.2

## 2014-08-29 NOTE — Patient Instructions (Signed)
Diabetes and Standards of Medical Care  Diabetes is complicated. You may find that your diabetes team includes a dietitian, nurse, diabetes educator, eye doctor, and more. To help everyone know what is going on and to help you get the care you deserve, the following schedule of care was developed to help keep you on track. Below are the tests, exams, vaccines, medicines, education, and plans you will need.  Blood Glucose Goals Prior to meals = 80 - 130 Within 2 hours of the start of a meal = less than 180  HbA1c test (goal is less than 7.0% - your last value was 6.2%) This test shows how well you have controlled your glucose over the past 2 3 months. It is used to see if your diabetes management plan needs to be adjusted.   It is performed at least 2 times a year if you are meeting treatment goals.  It is performed 4 times a year if therapy has changed or if you are not meeting treatment goals.   Blood pressure test  This test is performed at every routine medical visit. The goal is less than 140/90 mmHg for most people, but 130/80 mmHg in some cases. Ask your health care provider about your goal. Dental exam  Follow up with the dentist regularly. Eye exam  If you are diagnosed with type 1 diabetes as a child, get an exam upon reaching the age of 57 years or older and have had diabetes for 3 5 years. Yearly eye exams are recommended after that initial eye exam.  If you are diagnosed with type 1 diabetes as an adult, get an exam within 5 years of diagnosis and then yearly.  If you are diagnosed with type 2 diabetes, get an exam as soon as possible after the diagnosis and then yearly. Foot care exam  Visual foot exams are performed at every routine medical visit. The exams check for cuts, injuries, or other problems with the feet.  A comprehensive foot exam should be done yearly. This includes visual inspection as well as assessing foot pulses and testing for loss of sensation.  Check  your feet nightly for cuts, injuries, or other problems with your feet. Tell your health care provider if anything is not healing. Kidney function test (urine microalbumin)  This test is performed once a year.  Type 1 diabetes: The first test is performed 5 years after diagnosis.  Type 2 diabetes: The first test is performed at the time of diagnosis.  A serum creatinine and estimated glomerular filtration rate (eGFR) test is done once a year to assess the level of chronic kidney disease (CKD), if present. Lipid profile (cholesterol, HDL, LDL, triglycerides)  Performed every 5 years for most people.  The goal for LDL is less than 100 mg/dL. If you are at high risk, the goal is less than 70 mg/dL.  The goal for HDL is 40 mg/dL 50 mg/dL for men and 50 mg/dL 60 mg/dL for women. An HDL cholesterol of 60 mg/dL or higher gives some protection against heart disease.  The goal for triglycerides is less than 150 mg/dL. Influenza vaccine, pneumococcal vaccine, and hepatitis B vaccine  The influenza vaccine is recommended yearly.  The pneumococcal vaccine is generally given once in a lifetime. However, there are some instances when another vaccination is recommended. Check with your health care provider.  The hepatitis B vaccine is also recommended for adults with diabetes. Diabetes self-management education  Education is recommended at diagnosis and ongoing  as needed. Treatment plan  Your treatment plan is reviewed at every medical visit. Document Released: 09/25/2009 Document Revised: 07/31/2013 Document Reviewed: 04/30/2013 [facility] Patient Information 2014 [place].  Health Maintenance Summary    PNEUMOCOCCAL POLYSACCHARIDE VACCINE Overdue 08/10/1969      TETANUS/TDAP Overdue 08/10/1986     Colonoscopy Next Due 08/24/2015 to 08/23/2017  last done 08/23/2012    INFLUENZA VACCINE Overdue 07/12/2014      HEMOGLOBIN A1C Next Due 02/27/2015      OPHTHALMOLOGY EXAM Next Due 03/13/2015       FOOT EXAM Next Due 08/30/2015      URINE MICROALBUMIN Next Due 08/30/2015      PAP SMEAR Next Due 08/12/2016         Preventive Care for Adults A healthy lifestyle and preventive care can promote health and wellness. Preventive health guidelines for women include the following key practices.  A routine yearly physical is a good way to check with your health care provider about your health and preventive screening. It is a chance to share any concerns and updates on your health and to receive a thorough exam.  Visit your dentist for a routine exam and preventive care every 6 months. Brush your teeth twice a day and floss once a day. Good oral hygiene prevents tooth decay and gum disease.  The frequency of eye exams is based on your age, health, family medical history, use of contact lenses, and other factors. Follow your health care provider's recommendations for frequency of eye exams.  Eat a healthy diet. Foods like vegetables, fruits, whole grains, low-fat dairy products, and lean protein foods contain the nutrients you need without too many calories. Decrease your intake of foods high in solid fats, added sugars, and salt. Eat the right amount of calories for you.Get information about a proper diet from your health care provider, if necessary.  Regular physical exercise is one of the most important things you can do for your health. Most adults should get at least 150 minutes of moderate-intensity exercise (any activity that increases your heart rate and causes you to sweat) each week. In addition, most adults need muscle-strengthening exercises on 2 or more days a week.  Maintain a healthy weight. The body mass index (BMI) is a screening tool to identify possible weight problems. It provides an estimate of body fat based on height and weight. Your health care provider can find your BMI and can help you achieve or maintain a healthy weight.For adults 20 years and older:  A BMI below 18.5 is  considered underweight.  A BMI of 18.5 to 24.9 is normal.  A BMI of 25 to 29.9 is considered overweight.  A BMI of 30 and above is considered obese.  Maintain normal blood lipids and cholesterol levels by exercising and minimizing your intake of saturated fat. Eat a balanced diet with plenty of fruit and vegetables. Blood tests for lipids and cholesterol should begin at age 27 and be repeated every 5 years. If your lipid or cholesterol levels are high, you are over 50, or you are at high risk for heart disease, you may need your cholesterol levels checked more frequently.Ongoing high lipid and cholesterol levels should be treated with medicines if diet and exercise are not working.  If you smoke, find out from your health care provider how to quit. If you do not use tobacco, do not start.  Lung cancer screening is recommended for adults aged 63-80 years who are at high risk  for developing lung cancer because of a history of smoking. A yearly low-dose CT scan of the lungs is recommended for people who have at least a 30-pack-year history of smoking and are a current smoker or have quit within the past 15 years. A pack year of smoking is smoking an average of 1 pack of cigarettes a day for 1 year (for example: 1 pack a day for 30 years or 2 packs a day for 15 years). Yearly screening should continue until the smoker has stopped smoking for at least 15 years. Yearly screening should be stopped for people who develop a health problem that would prevent them from having lung cancer treatment.  If you are pregnant, do not drink alcohol. If you are breastfeeding, be very cautious about drinking alcohol. If you are not pregnant and choose to drink alcohol, do not have more than 1 drink per day. One drink is considered to be 12 ounces (355 mL) of beer, 5 ounces (148 mL) of wine, or 1.5 ounces (44 mL) of liquor.  Avoid use of street drugs. Do not share needles with anyone. Ask for help if you need support or  instructions about stopping the use of drugs.  High blood pressure causes heart disease and increases the risk of stroke. Your blood pressure should be checked at least every 1 to 2 years. Ongoing high blood pressure should be treated with medicines if weight loss and exercise do not work.  If you are 49-33 years old, ask your health care provider if you should take aspirin to prevent strokes.  Diabetes screening involves taking a blood sample to check your fasting blood sugar level. This should be done once every 3 years, after age 23, if you are within normal weight and without risk factors for diabetes. Testing should be considered at a younger age or be carried out more frequently if you are overweight and have at least 1 risk factor for diabetes.  Breast cancer screening is essential preventive care for women. You should practice "breast self-awareness." This means understanding the normal appearance and feel of your breasts and may include breast self-examination. Any changes detected, no matter how small, should be reported to a health care provider. Women in their 14s and 30s should have a clinical breast exam (CBE) by a health care provider as part of a regular health exam every 1 to 3 years. After age 59, women should have a CBE every year. Starting at age 28, women should consider having a mammogram (breast X-ray test) every year. Women who have a family history of breast cancer should talk to their health care provider about genetic screening. Women at a high risk of breast cancer should talk to their health care providers about having an MRI and a mammogram every year.  Breast cancer gene (BRCA)-related cancer risk assessment is recommended for women who have family members with BRCA-related cancers. BRCA-related cancers include breast, ovarian, tubal, and peritoneal cancers. Having family members with these cancers may be associated with an increased risk for harmful changes (mutations) in  the breast cancer genes BRCA1 and BRCA2. Results of the assessment will determine the need for genetic counseling and BRCA1 and BRCA2 testing.  Routine pelvic exams to screen for cancer are no longer recommended for nonpregnant women who are considered low risk for cancer of the pelvic organs (ovaries, uterus, and vagina) and who do not have symptoms. Ask your health care provider if a screening pelvic exam is right for you.  If you have had past treatment for cervical cancer or a condition that could lead to cancer, you need Pap tests and screening for cancer for at least 20 years after your treatment. If Pap tests have been discontinued, your risk factors (such as having a new sexual partner) need to be reassessed to determine if screening should be resumed. Some women have medical problems that increase the chance of getting cervical cancer. In these cases, your health care provider may recommend more frequent screening and Pap tests.  The HPV test is an additional test that may be used for cervical cancer screening. The HPV test looks for the virus that can cause the cell changes on the cervix. The cells collected during the Pap test can be tested for HPV. The HPV test could be used to screen women aged 9 years and older, and should be used in women of any age who have unclear Pap test results. After the age of 89, women should have HPV testing at the same frequency as a Pap test.  Colorectal cancer can be detected and often prevented. Most routine colorectal cancer screening begins at the age of 8 years and continues through age 67 years. However, your health care provider may recommend screening at an earlier age if you have risk factors for colon cancer. On a yearly basis, your health care provider may provide home test kits to check for hidden blood in the stool. Use of a small camera at the end of a tube, to directly examine the colon (sigmoidoscopy or colonoscopy), can detect the earliest forms  of colorectal cancer. Talk to your health care provider about this at age 60, when routine screening begins. Direct exam of the colon should be repeated every 5-10 years through age 72 years, unless early forms of pre-cancerous polyps or small growths are found.  People who are at an increased risk for hepatitis B should be screened for this virus. You are considered at high risk for hepatitis B if:  You were born in a country where hepatitis B occurs often. Talk with your health care provider about which countries are considered high risk.  Your parents were born in a high-risk country and you have not received a shot to protect against hepatitis B (hepatitis B vaccine).  You have HIV or AIDS.  You use needles to inject street drugs.  You live with, or have sex with, someone who has hepatitis B.  You get hemodialysis treatment.  You take certain medicines for conditions like cancer, organ transplantation, and autoimmune conditions.  Hepatitis C blood testing is recommended for all people born from [birth year] through 1964 and any individual with known risks for hepatitis C.  Practice safe sex. Use condoms and avoid high-risk sexual practices to reduce the spread of sexually transmitted infections (STIs). STIs include gonorrhea, chlamydia, syphilis, trichomonas, herpes, HPV, and human immunodeficiency virus (HIV). Herpes, HIV, and HPV are viral illnesses that have no cure. They can result in disability, cancer, and death.  You should be screened for sexually transmitted illnesses (STIs) including gonorrhea and chlamydia if:  You are sexually active and are younger than 24 years.  You are older than 24 years and your health care provider tells you that you are at risk for this type of infection.  Your sexual activity has changed since you were last screened and you are at an increased risk for chlamydia or gonorrhea. Ask your health care provider if you are at risk.  If you are  at risk of  being infected with HIV, it is recommended that you take a prescription medicine daily to prevent HIV infection. This is called preexposure prophylaxis (PrEP). You are considered at risk if:  You are a heterosexual woman, are sexually active, and are at increased risk for HIV infection.  You take drugs by injection.  You are sexually active with a partner who has HIV.  Talk with your health care provider about whether you are at high risk of being infected with HIV. If you choose to begin PrEP, you should first be tested for HIV. You should then be tested every 3 months for as long as you are taking PrEP.  Osteoporosis is a disease in which the bones lose minerals and strength with aging. This can result in serious bone fractures or breaks. The risk of osteoporosis can be identified using a bone density scan. Women ages 69 years and over and women at risk for fractures or osteoporosis should discuss screening with their health care providers. Ask your health care provider whether you should take a calcium supplement or vitamin D to reduce the rate of osteoporosis.  Menopause can be associated with physical symptoms and risks. Hormone replacement therapy is available to decrease symptoms and risks. You should talk to your health care provider about whether hormone replacement therapy is right for you.  Use sunscreen. Apply sunscreen liberally and repeatedly throughout the day. You should seek shade when your shadow is shorter than you. Protect yourself by wearing long sleeves, pants, a wide-brimmed hat, and sunglasses year round, whenever you are outdoors.  Once a month, do a whole body skin exam, using a mirror to look at the skin on your back. Tell your health care provider of new moles, moles that have irregular borders, moles that are larger than a pencil eraser, or moles that have changed in shape or color.  Stay current with required vaccines (immunizations).  Influenza vaccine. All adults  should be immunized every year.  Tetanus, diphtheria, and acellular pertussis (Td, Tdap) vaccine. Pregnant women should receive 1 dose of Tdap vaccine during each pregnancy. The dose should be obtained regardless of the length of time since the last dose. Immunization is preferred during the 27th-36th week of gestation. An adult who has not previously received Tdap or who does not know her vaccine status should receive 1 dose of Tdap. This initial dose should be followed by tetanus and diphtheria toxoids (Td) booster doses every 10 years. Adults with an unknown or incomplete history of completing a 3-dose immunization series with Td-containing vaccines should begin or complete a primary immunization series including a Tdap dose. Adults should receive a Td booster every 10 years.  Varicella vaccine. An adult without evidence of immunity to varicella should receive 2 doses or a second dose if she has previously received 1 dose. Pregnant females who do not have evidence of immunity should receive the first dose after pregnancy. This first dose should be obtained before leaving the health care facility. The second dose should be obtained 4-8 weeks after the first dose.  Human papillomavirus (HPV) vaccine. Females aged 13-26 years who have not received the vaccine previously should obtain the 3-dose series. The vaccine is not recommended for use in pregnant females. However, pregnancy testing is not needed before receiving a dose. If a female is found to be pregnant after receiving a dose, no treatment is needed. In that case, the remaining doses should be delayed until after the pregnancy. Immunization is  recommended for any person with an immunocompromised condition through the age of 68 years if she did not get any or all doses earlier. During the 3-dose series, the second dose should be obtained 4-8 weeks after the first dose. The third dose should be obtained 24 weeks after the first dose and 16 weeks after  the second dose.  Zoster vaccine. One dose is recommended for adults aged 18 years or older unless certain conditions are present.  Measles, mumps, and rubella (MMR) vaccine. Adults born before [birth year] generally are considered immune to measles and mumps. Adults born in [birth year] or later should have 1 or more doses of MMR vaccine unless there is a contraindication to the vaccine or there is laboratory evidence of immunity to each of the three diseases. A routine second dose of MMR vaccine should be obtained at least 28 days after the first dose for students attending postsecondary schools, health care workers, or international travelers. People who received inactivated measles vaccine or an unknown type of measles vaccine during 1963-1967 should receive 2 doses of MMR vaccine. People who received inactivated mumps vaccine or an unknown type of mumps vaccine before 1979 and are at high risk for mumps infection should consider immunization with 2 doses of MMR vaccine. For females of childbearing age, rubella immunity should be determined. If there is no evidence of immunity, females who are not pregnant should be vaccinated. If there is no evidence of immunity, females who are pregnant should delay immunization until after pregnancy. Unvaccinated health care workers born before [birth year] who lack laboratory evidence of measles, mumps, or rubella immunity or laboratory confirmation of disease should consider measles and mumps immunization with 2 doses of MMR vaccine or rubella immunization with 1 dose of MMR vaccine.  Pneumococcal 13-valent conjugate (PCV13) vaccine. When indicated, a person who is uncertain of her immunization history and has no record of immunization should receive the PCV13 vaccine. An adult aged 49 years or older who has certain medical conditions and has not been previously immunized should receive 1 dose of PCV13 vaccine. This PCV13 should be followed with a dose of pneumococcal polysaccharide (PPSV23)  vaccine. The PPSV23 vaccine dose should be obtained at least 8 weeks after the dose of PCV13 vaccine. An adult aged 65 years or older who has certain medical conditions and previously received 1 or more doses of PPSV23 vaccine should receive 1 dose of PCV13. The PCV13 vaccine dose should be obtained 1 or more years after the last PPSV23 vaccine dose.  Pneumococcal polysaccharide (PPSV23) vaccine. When PCV13 is also indicated, PCV13 should be obtained first. All adults aged 8 years and older should be immunized. An adult younger than age 81 years who has certain medical conditions should be immunized. Any person who resides in a nursing home or long-term care facility should be immunized. An adult smoker should be immunized. People with an immunocompromised condition and certain other conditions should receive both PCV13 and PPSV23 vaccines. People with human immunodeficiency virus (HIV) infection should be immunized as soon as possible after diagnosis. Immunization during chemotherapy or radiation therapy should be avoided. Routine use of PPSV23 vaccine is not recommended for American Indians, [place] Natives, or people younger than 65 years unless there are medical conditions that require PPSV23 vaccine. When indicated, people who have unknown immunization and have no record of immunization should receive PPSV23 vaccine. One-time revaccination 5 years after the first dose of PPSV23 is recommended for people aged 19-64 years who have chronic kidney failure,  nephrotic syndrome, asplenia, or immunocompromised conditions. People who received 1-2 doses of PPSV23 before age 44 years should receive another dose of PPSV23 vaccine at age 65 years or later if at least 5 years have passed since the previous dose. Doses of PPSV23 are not needed for people immunized with PPSV23 at or after age 8 years.  Meningococcal vaccine. Adults with asplenia or persistent complement component deficiencies should receive 2 doses of  quadrivalent meningococcal conjugate (MenACWY-D) vaccine. The doses should be obtained at least 2 months apart. Microbiologists working with certain meningococcal bacteria, [place] recruits, people at risk during an outbreak, and people who travel to or live in countries with a high rate of meningitis should be immunized. A first-year college student up through age 32 years who is living in a residence hall should receive a dose if she did not receive a dose on or after her 16th birthday. Adults who have certain high-risk conditions should receive one or more doses of vaccine.  Hepatitis A vaccine. Adults who wish to be protected from this disease, have certain high-risk conditions, work with hepatitis A-infected animals, work in hepatitis A research labs, or travel to or work in countries with a high rate of hepatitis A should be immunized. Adults who were previously unvaccinated and who anticipate close contact with an international adoptee during the first 60 days after arrival in the [place] from a country with a high rate of hepatitis A should be immunized.  Hepatitis B vaccine. Adults who wish to be protected from this disease, have certain high-risk conditions, may be exposed to blood or other infectious body fluids, are household contacts or sex partners of hepatitis B positive people, are clients or workers in certain care facilities, or travel to or work in countries with a high rate of hepatitis B should be immunized.  Haemophilus influenzae type b (Hib) vaccine. A previously unvaccinated person with asplenia or sickle cell disease or having a scheduled splenectomy should receive 1 dose of Hib vaccine. Regardless of previous immunization, a recipient of a hematopoietic stem cell transplant should receive a 3-dose series 6-12 months after her successful transplant. Hib vaccine is not recommended for adults with HIV infection. Preventive Services / Frequency Ages 60 to 69 years  Blood  pressure check.** / Every 1 to 2 years.  Lipid and cholesterol check.** / Every 5 years beginning at age 48 years.  Lung cancer screening. / Every year if you are aged 42-80 years and have a 30-pack-year history of smoking and currently smoke or have quit within the past 15 years. Yearly screening is stopped once you have quit smoking for at least 15 years or develop a health problem that would prevent you from having lung cancer treatment.  Clinical breast exam.** / Every year after age 22 years.  BRCA-related cancer risk assessment.** / For women who have family members with a BRCA-related cancer (breast, ovarian, tubal, or peritoneal cancers).  Mammogram.** / Every year beginning at age 42 years and continuing for as long as you are in good health. Consult with your health care provider.  Pap test.** / Every 3 years starting at age 30 years through age 52 or 65 years with a history of 3 consecutive normal Pap tests.  HPV screening.** / Every 3 years from ages 72 years through ages 67 to 35 years with a history of 3 consecutive normal Pap tests.  Fecal occult blood test (FOBT) of stool. / Every year beginning at age 67 years and  continuing until age 16 years. You may not need to do this test if you get a colonoscopy every 10 years.  Flexible sigmoidoscopy or colonoscopy.** / Every 5 years for a flexible sigmoidoscopy or every 10 years for a colonoscopy beginning at age 63 years and continuing until age 51 years.  Hepatitis C blood test.** / For all people born from [birth year] through 1964 and any individual with known risks for hepatitis C.  Skin self-exam. / Monthly.  Influenza vaccine. / Every year.  Tetanus, diphtheria, and acellular pertussis (Tdap/Td) vaccine.** / Consult your health care provider. Pregnant women should receive 1 dose of Tdap vaccine during each pregnancy. 1 dose of Td every 10 years.  Varicella vaccine.** / Consult your health care provider. Pregnant females who do not  have evidence of immunity should receive the first dose after pregnancy.  Zoster vaccine.** / 1 dose for adults aged 27 years or older.  Measles, mumps, rubella (MMR) vaccine.** / You need at least 1 dose of MMR if you were born in 1957 or later. You may also need a 2nd dose. For females of childbearing age, rubella immunity should be determined. If there is no evidence of immunity, females who are not pregnant should be vaccinated. If there is no evidence of immunity, females who are pregnant should delay immunization until after pregnancy.  Pneumococcal 13-valent conjugate (PCV13) vaccine.** / Consult your health care provider.  Pneumococcal polysaccharide (PPSV23) vaccine.** / 1 to 2 doses if you smoke cigarettes or if you have certain conditions.  Meningococcal vaccine.** / Consult your health care provider.  Hepatitis A vaccine.** / Consult your health care provider.  Hepatitis B vaccine.** / Consult your health care provider.  Haemophilus influenzae type b (Hib) vaccine.** / Consult your health care provider.

## 2014-08-29 NOTE — Progress Notes (Signed)
Subjective:    Morgan [name] is a 48 y.o. female who presents for Medicare Initial Wellness Visit and recheck diabetes and elevated triglycerides.  HBG readings:  High - 227 (after steroid injection); Low - 96;  Average = 111  Preventive Screening-Counseling & Management  Tobacco History  Smoking status  . Never Smoker   Smokeless tobacco  . Never Used    Current Problems (verified) Patient Active Problem List   Diagnosis Date Noted  . Pancreatitis 02/27/2014  . Fatty liver disease, nonalcoholic 50/27/7412  . Bipolar disorder, unspecified 02/20/2014  . Metabolic syndrome 87/86/7672  . Vitamin D deficiency   . Morbid obesity 03/12/2013  . DM (diabetes mellitus) 03/12/2013  . HLD (hyperlipidemia) 03/12/2013  . Preoperative evaluation to rule out surgical contraindication 06/12/2011  . Unspecified hypothyroidism   . Constipation, chronic   . Vitamin B 12 deficiency   . ALLERGIC RHINITIS 12/31/2010  . BARRETTS ESOPHAGUS 12/31/2010  . Gastroparesis 12/31/2010  . OSTEOARTHRITIS 12/31/2010  . OBSTRUCTIVE SLEEP APNEA 09/27/2010  . MIGRAINE HEADACHE 09/27/2010  . HYPERTENSION 09/27/2010  . RESPIRATORY FAILURE, CHRONIC 09/27/2010    Medications Prior to Visit Current Outpatient Prescriptions on File Prior to Visit  Medication Sig Dispense Refill  . Cholecalciferol (VITAMIN D) 2000 UNITS CAPS Take 2,000 Units by mouth daily.      Marland Kitchen etodolac (LODINE) 500 MG tablet Take 1 tablet (500 mg total) by mouth 2 (two) times daily.  60 tablet  0  . ezetimibe (ZETIA) 10 MG tablet Take 0.5 tablets (5 mg total) by mouth daily.  30 tablet  5  . fenofibrate micronized (ANTARA) 130 MG capsule TAKE (1) CAPSULE DAILY AT BEDTIME.  30 capsule  5  . ferrous gluconate (FERGON) 325 MG tablet Take 325 mg by mouth daily with breakfast.        . fluticasone (FLONASE) 50 MCG/ACT nasal spray SPRAY 1 SPRAY IN EACH NOSTRIL TWICE DAILY. SHAKE GENTLY BEFORE EACH USE.  16 g  3  . furosemide (LASIX) 20 MG  tablet TAKE 1 TABLET DAILY  30 tablet  3  . glucose blood (ONE TOUCH ULTRA TEST) test strip Use as instructed  100 each  0  . levothyroxine (SYNTHROID, LEVOTHROID) 150 MCG tablet TAKE 1 TABLET DAILY  30 tablet  10  . LINZESS 145 MCG CAPS capsule TAKE (1) CAPSULE DAILY  30 capsule  6  . loratadine (CLARITIN) 10 MG tablet Take 10 mg by mouth daily. Prn        . MEDROXYPROGESTERONE ACETATE PO Take by mouth. Take 1 tab daily for 12 days per month every other month       . metFORMIN (GLUCOPHAGE) 500 MG tablet Take 1 tablet (500 mg total) by mouth every evening.  30 tablet  2  . Multiple Vitamin (MULTIVITAMIN) capsule Take 1 capsule by mouth daily.        Marland Kitchen nystatin (MYCOSTATIN) powder Apply topically 2 (two) times daily as needed.        Marland Kitchen omeprazole (PRILOSEC) 40 MG capsule TAKE (1) CAPSULE DAILY  30 capsule  5  . potassium chloride (K-DUR,KLOR-CON) 10 MEQ tablet Take 1 tablet (10 mEq total) by mouth daily.  30 tablet  3  . Probiotic Product (HEALTHY COLON PO) Take 2 capsules by mouth daily.       . sertraline (ZOLOFT) 100 MG tablet TAKE (2) TABLETS DAILY  60 tablet  3  . traMADol (ULTRAM) 50 MG tablet Take 50 mg by mouth 2 (two) times daily.       Marland Kitchen  valsartan-hydrochlorothiazide (DIOVAN-HCT) 160-25 MG per tablet TAKE 1 TABLET DAILY  30 tablet  5   Current Facility-Administered Medications on File Prior to Visit  Medication Dose Route Frequency Provider Last Rate Last Dose  . cyanocobalamin ((VITAMIN B-12)) injection 1,000 mcg  1,000 mcg Intramuscular Q30 days Chipper Herb, MD   1,000 mcg at 08/22/14 1125    Current Medications (verified) Current Outpatient Prescriptions  Medication Sig Dispense Refill  . Cholecalciferol (VITAMIN D) 2000 UNITS CAPS Take 2,000 Units by mouth daily.      Marland Kitchen etodolac (LODINE) 500 MG tablet Take 1 tablet (500 mg total) by mouth 2 (two) times daily.  60 tablet  0  . ezetimibe (ZETIA) 10 MG tablet Take 0.5 tablets (5 mg total) by mouth daily.  30 tablet  5  .  fenofibrate micronized (ANTARA) 130 MG capsule TAKE (1) CAPSULE DAILY AT BEDTIME.  30 capsule  5  . ferrous gluconate (FERGON) 325 MG tablet Take 325 mg by mouth daily with breakfast.        . fluticasone (FLONASE) 50 MCG/ACT nasal spray SPRAY 1 SPRAY IN EACH NOSTRIL TWICE DAILY. SHAKE GENTLY BEFORE EACH USE.  16 g  3  . furosemide (LASIX) 20 MG tablet TAKE 1 TABLET DAILY  30 tablet  3  . glucose blood (ONE TOUCH ULTRA TEST) test strip Use as instructed  100 each  0  . levothyroxine (SYNTHROID, LEVOTHROID) 150 MCG tablet TAKE 1 TABLET DAILY  30 tablet  10  . LINZESS 145 MCG CAPS capsule TAKE (1) CAPSULE DAILY  30 capsule  6  . loratadine (CLARITIN) 10 MG tablet Take 10 mg by mouth daily. Prn        . MEDROXYPROGESTERONE ACETATE PO Take by mouth. Take 1 tab daily for 12 days per month every other month       . metFORMIN (GLUCOPHAGE) 500 MG tablet Take 1 tablet (500 mg total) by mouth every evening.  30 tablet  2  . Multiple Vitamin (MULTIVITAMIN) capsule Take 1 capsule by mouth daily.        Marland Kitchen nystatin (MYCOSTATIN) powder Apply topically 2 (two) times daily as needed.        Marland Kitchen omeprazole (PRILOSEC) 40 MG capsule TAKE (1) CAPSULE DAILY  30 capsule  5  . potassium chloride (K-DUR,KLOR-CON) 10 MEQ tablet Take 1 tablet (10 mEq total) by mouth daily.  30 tablet  3  . Probiotic Product (HEALTHY COLON PO) Take 2 capsules by mouth daily.       . sertraline (ZOLOFT) 100 MG tablet TAKE (2) TABLETS DAILY  60 tablet  3  . traMADol (ULTRAM) 50 MG tablet Take 50 mg by mouth 2 (two) times daily.       . valsartan-hydrochlorothiazide (DIOVAN-HCT) 160-25 MG per tablet TAKE 1 TABLET DAILY  30 tablet  5  . ziprasidone (GEODON) 60 MG capsule TAKE  (1)  CAPSULE  daily       Current Facility-Administered Medications  Medication Dose Route Frequency Provider Last Rate Last Dose  . cyanocobalamin ((VITAMIN B-12)) injection 1,000 mcg  1,000 mcg Intramuscular Q30 days Chipper Herb, MD   1,000 mcg at 08/22/14 1125      Allergies (verified) Codeine; Ketoconazole; Pravachol; and Statins   PAST HISTORY  Family History Family History  Problem Relation Age of Onset  . Asthma Father   . Allergies Father   . Heart disease Father     enlarged heart  . Peripheral vascular disease Father   .  Diabetes Father   . Hyperlipidemia Father   . Arthritis Father   . Asthma Sister   . Cancer Sister     colon at 47 yr old.  . Colon cancer Sister   . Allergies Mother   . Stroke Mother 22    with hemi paralysis  . Diabetes Mother   . Hyperlipidemia Mother   . Hypertension Mother   . GI problems Mother   . Arthritis Mother   . Allergies Brother   . Early death Brother 9    congenital abormality  . Allergies Sister   . Diabetes Sister   . Asthma Sister   . Colon polyps Sister   . Hyperlipidemia Sister   . GI problems Sister     gastroporesis   . Liver disease Sister     fatty liver  . Diabetes Daughter   . Hyperlipidemia Daughter   . Hypertension Daughter     Social History History  Substance Use Topics  . Smoking status: Never Smoker   . Smokeless tobacco: Never Used  . Alcohol Use: No     Are there smokers in your home (other than you)? No  Risk Factors Current exercise habits: walks when she can and chair exercises - limited by knee and shoulder pain  Dietary issues discussed: low CHO diet and Low fat diet   Cardiac risk factors: diabetes mellitus, dyslipidemia, family history of premature cardiovascular disease, hypertension, obesity (BMI >= 30 kg/m2) and sedentary lifestyle.  Depression Screen (Note: if answer to either of the following is "Yes", a more complete depression screening is indicated)   Over the past 2 weeks, have you felt down, depressed or hopeless? No  Over the past 2 weeks, have you felt little interest or pleasure in doing things? No  Have you lost interest or pleasure in daily life? No  Do you often feel hopeless? No  Do you cry easily over simple problems?  No  Activities of Daily Living In your present state of health, do you have any difficulty performing the following activities?:  Driving? No Managing money?  No Feeding yourself? No Getting from bed to chair? No  Climbing a flight of stairs? Yes Preparing food and eating?: No Bathing or showering? No Getting dressed: No Getting to the toilet? No Using the toilet:No Moving around from place to place: No In the past year have you fallen or had a near fall?:No   Are you sexually active?  No  Do you have more than one partner?  No  Hearing Difficulties: No Do you often ask people to speak up or repeat themselves? No Do you experience ringing or noises in your ears? No Do you have difficulty understanding soft or whispered voices? No   Do you feel that you have a problem with memory? No  Do you often misplace items? No  Do you feel safe at home?  Yes  Cognitive Testing  Alert? Yes  Normal Appearance?Yes  Oriented to person? Yes  Place? Yes   Time? Yes  Recall of three objects?  Yes  Can perform simple calculations? Yes  Displays appropriate judgment?Yes  Can read the correct time from a watch face?Yes   Advanced Directives have been discussed with the patient? Yes  List the Names of Other Physician/Practitioners you currently use: 1.  Henry Mixon - GI 2.  Dr Earnstine Regal and [place] 3.  Dr. Marin Comment - optometrist / [facility]  Indicate any recent Medical Services you may have received  from other than [facility] providers in the past year (date may be approximate).   There is no immunization history on file for this patient.  Screening Tests Health Maintenance  Topic Date Due  . Pneumococcal Polysaccharide Vaccine (##1) 08/10/1969  . Tetanus/tdap  08/10/1986  . Influenza Vaccine  07/12/2014  . Hemoglobin A1c  02/27/2015  . Ophthalmology Exam  03/13/2015  . Foot Exam  08/30/2015  . Urine Microalbumin  08/30/2015  . Pap Smear  08/12/2016    All answers were reviewed with the  patient and necessary referrals were made:  Cherre Robins, [facility]   08/29/2014   History reviewed: allergies, current medications, past family history, past medical history, past social history, past surgical history and problem list  Objective:    Body mass index is 50.81 kg/(m^2). BP 116/72  Pulse 66  Ht '5\' 7"'  (1.702 m)  Wt 324 lb 8 oz (147.192 kg)  BMI 50.81 kg/m2  A1c was 6.2% today in office  Assessment:     Initial Medicare Wellenss Visit Type 2 DM controlled  Foot exam - shows callus of concern on right great toe/  Poor fitting shoes Mixes dyslipidemia HTN - controlled Metabolic Syndrome     Plan:     During the course of the visit the patient was educated and counseled about appropriate screening and preventive services including:    Pneumococcal vaccine - patient declined  Influenza vaccine - patient declined  Td vaccine - patient declined  Screening mammography - patient declined  Screening Pap smear and pelvic exam - UTD - due 2016  Colorectal cancer screening - UTD  Glaucoma screening - UTD - records requested from Dr Marin Comment  Nutrition counseling - continue to limit high calorie, CHO foods  Advanced directives: patient given caring connections packet today  No changes in medications today  Foot exam performed today   Orders Placed This Encounter  Procedures  . CMP14+EGFR  . NMR, lipoprofile  . Microalbumin, urine  . Vitamin B12  . POCT glycosylated hemoglobin (Hb A1C)  . PR DIABETIC DELUXE SHOE    Pre-ulcerative callus on great toe of right foot. Dx:  700.00  Type 2 Diabetes - controlled Dx:  250.00   Patient Instructions (the written plan) was given to the patient.  Medicare Attestation I have personally reviewed: The patient's medical and social history Their use of alcohol, tobacco or illicit drugs Their current medications and supplements The patient's functional ability including ADLs,fall risks, home safety risks, cognitive, and  hearing and visual impairment Diet and physical activities Evidence for depression or mood disorders  The patient's weight, height, BMI, and BP/HR have been recorded in the chart.  I have made referrals, counseling, and provided education to the patient based on review of the above and I have provided the patient with a written personalized care plan for preventive services.     Cherre Robins, [facility]   08/29/2014

## 2014-08-30 LAB — NMR, LIPOPROFILE
Cholesterol: 206 mg/dL — ABNORMAL HIGH (ref 100–199)
HDL CHOLESTEROL BY NMR: 39 mg/dL — AB (ref >39)
HDL Particle Number: 35 umol/L (ref >=30.5)
LDL Particle Number: 1790 nmol/L — ABNORMAL HIGH (ref <1000)
LDL Size: 20 nm (ref >20.5)
LDLC SERPL CALC-MCNC: 115 mg/dL — ABNORMAL HIGH (ref 0–99)
LP-IR Score: 99 — ABNORMAL HIGH (ref <=45)
Small LDL Particle Number: 1269 nmol/L — ABNORMAL HIGH (ref <=527)
Triglycerides by NMR: 259 mg/dL — ABNORMAL HIGH (ref 0–149)

## 2014-08-30 LAB — CMP14+EGFR
ALT: 30 IU/L (ref 0–32)
AST: 23 IU/L (ref 0–40)
Albumin/Globulin Ratio: 1.9 (ref 1.1–2.5)
Albumin: 4.5 g/dL (ref 3.5–5.5)
Alkaline Phosphatase: 56 IU/L (ref 39–117)
BUN/Creatinine Ratio: 27 — ABNORMAL HIGH (ref 9–23)
BUN: 21 mg/dL (ref 6–24)
CALCIUM: 9.4 mg/dL (ref 8.7–10.2)
CHLORIDE: 99 mmol/L (ref 97–108)
CO2: 26 mmol/L (ref 18–29)
Creatinine, Ser: 0.78 mg/dL (ref 0.57–1.00)
GFR calc Af Amer: 105 mL/min/{1.73_m2} (ref >59)
GFR calc non Af Amer: 91 mL/min/{1.73_m2} (ref >59)
Globulin, Total: 2.4 g/dL (ref 1.5–4.5)
Glucose: 115 mg/dL — ABNORMAL HIGH (ref 65–99)
POTASSIUM: 4.2 mmol/L (ref 3.5–5.2)
Sodium: 142 mmol/L (ref 134–144)
Total Bilirubin: 0.3 mg/dL (ref 0.0–1.2)
Total Protein: 6.9 g/dL (ref 6.0–8.5)

## 2014-08-30 LAB — MICROALBUMIN, URINE: MICROALBUM., U, RANDOM: 9.8 ug/mL (ref 0.0–17.0)

## 2014-08-30 LAB — VITAMIN B12: VITAMIN B 12: 815 pg/mL (ref 211–946)

## 2014-09-03 ENCOUNTER — Telehealth: Payer: Self-pay | Admitting: Pharmacist

## 2014-09-03 ENCOUNTER — Encounter: Payer: Self-pay | Admitting: Pharmacist

## 2014-09-03 DIAGNOSIS — E785 Hyperlipidemia, unspecified: Secondary | ICD-10-CM

## 2014-09-03 MED ORDER — EZETIMIBE 10 MG PO TABS: 10.0000 mg | ORAL_TABLET | Freq: Every day | ORAL | Status: DC

## 2014-09-03 NOTE — Telephone Encounter (Signed)
Patient notified of lab results.  Instructed to increase Zetia to 10mg  daily. Rx sen to pharmacy

## 2014-09-11 ENCOUNTER — Other Ambulatory Visit: Payer: Self-pay | Admitting: *Deleted

## 2014-09-11 MED ORDER — GLUCOSE BLOOD VI STRP: ORAL_STRIP | Status: DC

## 2014-09-23 ENCOUNTER — Ambulatory Visit (INDEPENDENT_AMBULATORY_CARE_PROVIDER_SITE_OTHER): Payer: Medicare Other | Admitting: *Deleted

## 2014-09-23 DIAGNOSIS — E538 Deficiency of other specified B group vitamins: Secondary | ICD-10-CM

## 2014-09-23 NOTE — Patient Instructions (Signed)
Vitamin B12 Injections Every person needs vitamin B12. A deficiency develops when the body does not get enough of it. One way to overcome this is by getting B12 shots (injections). A B12 shot puts the vitamin directly into muscle tissue. This avoids any problems your body might have in absorbing it from food or a pill. In some people, the body has trouble using the vitamin correctly. This can cause a B12 deficiency. Not consuming enough of the vitamin can also cause a deficiency. Getting enough vitamin B12 can be hard for elderly people. Sometimes, they do not eat a well-balanced diet. The elderly are also more likely than younger people to have medical conditions or take medications that can lead to a deficiency. WHAT DOES VITAMIN B12 DO? Vitamin B12 does many things to help the body work right:  It helps the body make healthy red blood cells.  It helps maintain nerve cells.  It is involved in the body's process of converting food into energy (metabolism).  It is needed to make the genetic material in all cells (DNA). VITAMIN B12 FOOD SOURCES Most people get plenty of vitamin B12 through the foods they eat. It is present in:  Meat, fish, poultry, and eggs.  Milk and milk products.  It also is added when certain foods are made, including some breads, cereals and yogurts. The food is then called "fortified". CAUSES The most common causes of vitamin B12 deficiency are:  Pernicious anemia. The condition develops when the body cannot make enough healthy red blood cells. This stems from a lack of a protein made in the stomach (intrinsic factor). People without this protein cannot absorb enough vitamin B12 from food.  Malabsorption. This is when the body cannot absorb the vitamin. It can be caused by:  Pernicious anemia.  Surgery to remove part or all of the stomach can lead to malabsorption. Removal of part or all of the small intestine can also cause malabsorption.  Vegetarian diet.  People who are strict about not eating foods from animals could have trouble taking in enough vitamin B12 from diet alone.  Medications. Some medicines have been linked to B12 deficiency, such as Metformin (a drug prescribed for type 2 diabetes). Long-term use of stomach acid suppressants also can keep the vitamin from being absorbed.  Intestinal problems such as inflammatory bowel disease. If there are problems in the digestive tract, vitamin B12 may not be absorbed in good enough amounts. SYMPTOMS People who do not get enough B12 can develop problems. These can include:  Anemia. This is when the body has too few red blood cells. Red blood cells carry oxygen to the rest of the body. Without a healthy supply of red blood cells, people can feel:  Tired (fatigued).  Weak.  Severe anemia can cause:  Shortness of breath.  Dizziness.  Rapid heart rate.  Paleness.  Other Vitamin B12 deficiency symptoms include:  Diarrhea.  Numbness or tingling in the hands or feet.  Loss of appetite.  Confusion.  Sores on the tongue or in the mouth. LET YOUR CAREGIVER KNOW ABOUT:  Any allergies. It is very important to know if you are allergic or sensitive to cobalt. Vitamin B12 contains cobalt.  Any history of kidney disease.  All medications you are taking. Include prescription and over-the-counter medicines, herbs and creams.  Whether you are pregnant or breast-feeding.  If you have Leber's disease, a hereditary eye condition, vitamin B12 could make it worse. RISKS AND COMPLICATIONS Reactions to an injection are   usually temporary. They might include:  Pain at the injection site.  Redness, swelling or tenderness at the site.  Headache, dizziness or weakness.  Nausea, upset stomach or diarrhea.  Numbness or tingling.  Fever.  Joint pain.  Itching or rash. If a reaction does not go away in a short while, talk with your healthcare provider. A change in the way the shots are  given, or where they are given, might need to be made. BEFORE AN INJECTION To decide whether B12 injections are right for you, your healthcare provider will probably:  Ask about your medical history.  Ask questions about your diet.  Ask about symptoms such as:  Have you felt weak?  Do you feel unusually tired?  Do you get dizzy?  Order blood tests. These may include a test to:  Check the level of red cells in your blood.  Measure B12 levels.  Check for the presence of intrinsic factor. VITAMIN B12 INJECTIONS How often you will need a vitamin B12 injection will depend on how severe your deficiency is. This also will affect how long you will need to get them. People with pernicious anemia usually get injections for their entire life. Others might get them for a shorter period. For many people, injections are given daily or weekly for several weeks. Then, once B12 levels are normal, injections are given just once a month. If the cause of the deficiency can be fixed, the injections can be stopped. Talk with your healthcare provider about what you should expect. For an injection:  The injection site will be cleaned with an alcohol swab.  Your healthcare provider will insert a needle directly into a muscle. Most any muscle can be used. Most often, an arm muscle is used. A buttocks muscle can also be used. Many people say shots in that area are less painful.  A small adhesive bandage may be put over the injection site. It usually can be taken off in an hour or less. Injections can be given by your healthcare provider. In some cases, family members give them. Sometimes, people give them to themselves. Talk with your healthcare provider about what would be best for you. If someone other than your healthcare provider will be giving the shots, the person will need to be trained to give them correctly. HOME CARE INSTRUCTIONS   You can remove the adhesive bandage within an hour of getting a  shot.  You should be able to go about your normal activities right away.  Avoid drinking large amounts of alcohol while taking vitamin B12 shots. Alcohol can interfere with the body's use of the vitamin. SEEK MEDICAL CARE IF:   Pain, redness, swelling or tenderness at the injection site does not get better or gets worse.  Headache, dizziness or weakness does not go away.  You develop a fever of more than 100.5 F (38.1 C). SEEK IMMEDIATE MEDICAL CARE IF:   You have chest pain.  You develop shortness of breath.  You have muscle weakness that gets worse.  You develop numbness, weakness or tingling on one side or one area of the body.  You have symptoms of an allergic reaction, such as:  Hives.  Difficulty breathing.  Swelling of the lips, face, tongue or throat.  You develop a fever of more than 102.0 F (38.9 C). MAKE SURE YOU:   Understand these instructions.  Will watch your condition.  Will get help right away if you are not doing well or get worse. Document   Released: 02/24/2009 Document Revised: 02/20/2012 Document Reviewed: 02/24/2009 [employer] Patient Information 2015 [employer]. This information is not intended to replace advice given to you by your health care provider. Make sure you discuss any questions you have with your health care provider.  

## 2014-09-23 NOTE — Progress Notes (Signed)
Patient ID: Morgan [name], female   DOB: 12-08-1966, 48 y.o.   MRN: [id number] Vitamin B12 given and tolerated well.

## 2014-09-29 ENCOUNTER — Other Ambulatory Visit: Payer: Self-pay | Admitting: Family Medicine

## 2014-10-13 ENCOUNTER — Other Ambulatory Visit: Payer: Self-pay | Admitting: General Practice

## 2014-10-20 ENCOUNTER — Other Ambulatory Visit: Payer: Self-pay | Admitting: Family Medicine

## 2014-10-20 DIAGNOSIS — F3170 Bipolar disorder, currently in remission, most recent episode unspecified: Secondary | ICD-10-CM

## 2014-10-21 NOTE — Telephone Encounter (Signed)
Pt last seen in April 2015 by [place], she has appt with Sabra Heck 12/10/14, needs refill on Geodon, is it ok to refill

## 2014-10-24 ENCOUNTER — Ambulatory Visit (INDEPENDENT_AMBULATORY_CARE_PROVIDER_SITE_OTHER): Payer: Medicare Other

## 2014-10-24 DIAGNOSIS — E538 Deficiency of other specified B group vitamins: Secondary | ICD-10-CM

## 2014-11-19 ENCOUNTER — Other Ambulatory Visit: Payer: Self-pay | Admitting: Nurse Practitioner

## 2014-11-25 ENCOUNTER — Ambulatory Visit (INDEPENDENT_AMBULATORY_CARE_PROVIDER_SITE_OTHER): Payer: Medicare Other | Admitting: *Deleted

## 2014-11-25 ENCOUNTER — Telehealth: Payer: Self-pay | Admitting: Family Medicine

## 2014-11-25 DIAGNOSIS — E538 Deficiency of other specified B group vitamins: Secondary | ICD-10-CM

## 2014-11-25 NOTE — Patient Instructions (Signed)

## 2014-11-25 NOTE — Progress Notes (Signed)
Vitamin B12 injection given and tolerated well   

## 2014-11-26 ENCOUNTER — Other Ambulatory Visit: Payer: Self-pay | Admitting: Family Medicine

## 2014-11-26 NOTE — Telephone Encounter (Signed)
Ok to continue with maximally tolerated Zetia of 5mg  daily.  Due to recheck labs - 12/10/2014

## 2014-12-10 ENCOUNTER — Encounter: Payer: Self-pay | Admitting: Family Medicine

## 2014-12-10 ENCOUNTER — Ambulatory Visit (INDEPENDENT_AMBULATORY_CARE_PROVIDER_SITE_OTHER): Payer: Medicare Other | Admitting: Family Medicine

## 2014-12-10 VITALS — BP 124/79 | HR 69 | Temp 97.8°F | Ht 67.0 in | Wt 330.4 lb

## 2014-12-10 DIAGNOSIS — E785 Hyperlipidemia, unspecified: Secondary | ICD-10-CM

## 2014-12-10 DIAGNOSIS — E119 Type 2 diabetes mellitus without complications: Secondary | ICD-10-CM

## 2014-12-10 DIAGNOSIS — I10 Essential (primary) hypertension: Secondary | ICD-10-CM

## 2014-12-10 LAB — POCT GLYCOSYLATED HEMOGLOBIN (HGB A1C): HEMOGLOBIN A1C: 6.5

## 2014-12-10 MED ORDER — POTASSIUM CHLORIDE ER 10 MEQ PO TBCR: 10.0000 meq | EXTENDED_RELEASE_TABLET | Freq: Every day | ORAL | Status: DC

## 2014-12-10 MED ORDER — FENOFIBRATE MICRONIZED 130 MG PO CAPS: ORAL_CAPSULE | ORAL | Status: DC

## 2014-12-10 MED ORDER — FLUTICASONE PROPIONATE 50 MCG/ACT NA SUSP: NASAL | Status: DC

## 2014-12-10 MED ORDER — AMOXICILLIN 875 MG PO TABS: 875.0000 mg | ORAL_TABLET | Freq: Two times a day (BID) | ORAL | Status: DC

## 2014-12-10 MED ORDER — METFORMIN HCL 500 MG PO TABS: 500.0000 mg | ORAL_TABLET | Freq: Every evening | ORAL | Status: DC

## 2014-12-10 MED ORDER — SERTRALINE HCL 100 MG PO TABS: ORAL_TABLET | ORAL | Status: DC

## 2014-12-10 MED ORDER — VALSARTAN-HYDROCHLOROTHIAZIDE 160-25 MG PO TABS: ORAL_TABLET | ORAL | Status: DC

## 2014-12-10 NOTE — Progress Notes (Signed)
   Subjective:    Patient ID: Morgan [name], female    DOB: 09/17/1966, 48 y.o.   MRN: [id number]  HPI Morgan [name] is a 48 year old female who is here to follow-up hyperlipidemia, diabetes, obesity,. Today, she also complains of some sinus drainage consisting of some pressure in her sinuses and cough productive of green sputum.  We did an optimal screening assessment tool on her today looking at blood pressure diabetes with hemoglobin A1c as well as a depression screen. Depression screening shows no issues with depression and is entered in the electronic record. A1c is pending at the time of this dictation    Review of Systems  Constitutional: Negative.   HENT: Positive for congestion and sinus pressure.   Eyes: Negative.   Respiratory: Negative.   Cardiovascular: Negative.   Gastrointestinal: Negative.   Endocrine: Negative.   Genitourinary: Negative.   Hematological: Negative.   Psychiatric/Behavioral: Negative.        Objective:   Physical Exam  HENT:  Sinuses are tender to percussion          Assessment & Plan:  1. Type 2 diabetes mellitus without complication  - POCT glycosylated hemoglobin (Hb A1C)  2. Essential hypertension  - valsartan-hydrochlorothiazide (DIOVAN-HCT) 160-25 MG per tablet; TAKE 1 TABLET DAILY  Dispense: 30 tablet; Refill: 5  3. HLD (hyperlipidemia)  - fenofibrate micronized (ANTARA) 130 MG capsule; TAKE (1) CAPSULE DAILY AT BEDTIME.  Dispense: 90 capsule; Refill: 3  May have to substitute another fibrate since her insurance will not cover the above per history  Wardell Honour MD

## 2014-12-17 ENCOUNTER — Other Ambulatory Visit: Payer: Self-pay | Admitting: Nurse Practitioner

## 2014-12-26 ENCOUNTER — Ambulatory Visit (INDEPENDENT_AMBULATORY_CARE_PROVIDER_SITE_OTHER): Payer: Medicare Other | Admitting: *Deleted

## 2014-12-26 DIAGNOSIS — E538 Deficiency of other specified B group vitamins: Secondary | ICD-10-CM | POA: Diagnosis not present

## 2014-12-26 NOTE — Progress Notes (Signed)
Pt given B12 injection IM right deltoid, pt tolerated injection well. 

## 2014-12-26 NOTE — Patient Instructions (Signed)

## 2014-12-29 DIAGNOSIS — G4733 Obstructive sleep apnea (adult) (pediatric): Secondary | ICD-10-CM | POA: Diagnosis not present

## 2014-12-29 DIAGNOSIS — R0902 Hypoxemia: Secondary | ICD-10-CM | POA: Diagnosis not present

## 2015-01-12 ENCOUNTER — Telehealth: Payer: Self-pay | Admitting: Family Medicine

## 2015-01-12 DIAGNOSIS — G43809 Other migraine, not intractable, without status migrainosus: Secondary | ICD-10-CM

## 2015-01-12 NOTE — Telephone Encounter (Signed)
Please review and advise.

## 2015-01-13 NOTE — Telephone Encounter (Signed)
Okay to do referral

## 2015-01-14 NOTE — Telephone Encounter (Signed)
Pt.notified

## 2015-01-29 DIAGNOSIS — G4733 Obstructive sleep apnea (adult) (pediatric): Secondary | ICD-10-CM | POA: Diagnosis not present

## 2015-01-29 DIAGNOSIS — R0902 Hypoxemia: Secondary | ICD-10-CM | POA: Diagnosis not present

## 2015-01-30 ENCOUNTER — Ambulatory Visit (INDEPENDENT_AMBULATORY_CARE_PROVIDER_SITE_OTHER): Payer: Medicare Other | Admitting: *Deleted

## 2015-01-30 DIAGNOSIS — E538 Deficiency of other specified B group vitamins: Secondary | ICD-10-CM

## 2015-01-30 NOTE — Progress Notes (Signed)
Pt given B12 injection IM left deltoid, pt tolerated injection well. 

## 2015-01-30 NOTE — Patient Instructions (Signed)

## 2015-02-04 ENCOUNTER — Encounter: Payer: Self-pay | Admitting: Neurology

## 2015-02-04 ENCOUNTER — Ambulatory Visit (INDEPENDENT_AMBULATORY_CARE_PROVIDER_SITE_OTHER): Payer: Medicare Other | Admitting: Neurology

## 2015-02-04 VITALS — BP 141/82 | HR 72 | Ht 67.0 in | Wt 321.2 lb

## 2015-02-04 DIAGNOSIS — G43719 Chronic migraine without aura, intractable, without status migrainosus: Secondary | ICD-10-CM | POA: Diagnosis not present

## 2015-02-04 MED ORDER — SUMATRIPTAN SUCCINATE 100 MG PO TABS: 100.0000 mg | ORAL_TABLET | Freq: Two times a day (BID) | ORAL | Status: DC | PRN

## 2015-02-04 MED ORDER — TOPIRAMATE 25 MG PO TABS: ORAL_TABLET | ORAL | Status: DC

## 2015-02-04 NOTE — Progress Notes (Signed)
Reason for visit: Migraine headache  Morgan [name] is a 49 y.o. female  History of present illness:  Morgan [name] is a 49 year old right-handed white female with morbid obesity and sleep apnea. The patient gives a history of headaches that began around age 75 or 61. The patient indicates that following a tonsillectomy her headaches disappeared for number of years, but in her early 64s the headaches began once again. The headaches have gradually become more frequent over time, currently they are occurring once every other day. The headaches may last up to 6 hours, and may be associated with nausea, no vomiting. The headaches start in the occipital area of the head, and spread forward. There may be some neck stiffness with the headache. Headaches are pounding in nature and bilateral. The patient may have some visual disturbance with squiggly lines in the right eye only, and spots in eyes. The patient indicates that the week prior to and after her menstrual cycle is a period of time where the headaches are more frequent and more severe. The patient indicates that she has photophobia and phonophobia with headache. She has a lot of allergies and sinus drainage. She indicates that heat and certain odors such as perfumes may bring on the headache. The patient takes Tylenol with caffeine for the headache, averaging about 10 of these tablets a week. She has about 15 headache days per month. She also drinks 3 cups of tea daily, and she may have occasional coffee in the morning. She does not drink soft drinks. She indicates that she is incapacitated by the headaches frequently. She is sent to this office for an evaluation.  Past Medical History  Diagnosis Date  . Unspecified hypothyroidism   . Constipation, chronic   . Vitamin B 12 deficiency   . Barrett's esophagus   . Osteoarthritis     bilateral knee  . Gastroparesis   . Allergic rhinitis   . Chronic respiratory failure   . Migraine headache   .  Hypertension   . Diabetes mellitus without complication   . GERD (gastroesophageal reflux disease)   . OSA (obstructive sleep apnea)     uses a cpap  . Sleep apnea     has c-pap  . Vitamin D deficiency   . Bipolar affective disorder   . Hyperlipidemia   . Morbid obesity     Past Surgical History  Procedure Laterality Date  . Cholecystectomy  2005  . Appendectomy  1978  . Tonsillectomy  at age 65  . Shoulder arthroscopy  7/12    left-dsc  . Trigger finger release  12/20/2012    Procedure: RELEASE TRIGGER FINGER/A-1 PULLEY;  Surgeon: Tennis Must, MD;  Location: [facility];  Service: Orthopedics;  Laterality: Left;  LEFT TRIGGER THUMB RELEASE    Family History  Problem Relation Age of Onset  . Asthma Father   . Allergies Father   . Heart disease Father     enlarged heart  . Peripheral vascular disease Father   . Diabetes Father   . Hyperlipidemia Father   . Arthritis Father   . Asthma Sister   . Cancer Sister     colon at 48 yr old.  . Colon cancer Sister   . Allergies Mother   . Stroke Mother 57    with hemi paralysis  . Diabetes Mother   . Hyperlipidemia Mother   . Hypertension Mother   . GI problems Mother   . Arthritis Mother   .  Allergies Brother   . Early death Brother 9    congenital abormality  . Allergies Sister   . Diabetes Sister   . Asthma Sister   . Colon polyps Sister   . Hyperlipidemia Sister   . GI problems Sister     gastroporesis   . Liver disease Sister     fatty liver  . Diabetes Daughter   . Hyperlipidemia Daughter   . Hypertension Daughter   . Migraines Neg Hx     Social history:  reports that she has never smoked. She has never used smokeless tobacco. She reports that she does not drink alcohol or use illicit drugs.  Medications:  Current Outpatient Prescriptions on File Prior to Visit  Medication Sig Dispense Refill  . Cholecalciferol (VITAMIN D) 2000 UNITS CAPS Take 2,000 Units by mouth daily.    Marland Kitchen etodolac  (LODINE) 500 MG tablet Take 1 tablet (500 mg total) by mouth 2 (two) times daily. 60 tablet 0  . ezetimibe (ZETIA) 10 MG tablet Take 1 tablet (10 mg total) by mouth daily. 30 tablet 5  . fenofibrate micronized (ANTARA) 130 MG capsule TAKE (1) CAPSULE DAILY AT BEDTIME. 90 capsule 3  . ferrous gluconate (FERGON) 325 MG tablet Take 325 mg by mouth daily with breakfast.      . fluticasone (FLONASE) 50 MCG/ACT nasal spray SPRAY 1 SPRAY IN EACH NOSTRIL TWICE DAILY. SHAKE GENTLY BEFORE EACH USE. 16 g 3  . furosemide (LASIX) 20 MG tablet TAKE 1 TABLET DAILY 30 tablet 2  . glucose blood (ONE TOUCH ULTRA TEST) test strip Use as instructed 100 each 1  . levothyroxine (SYNTHROID, LEVOTHROID) 150 MCG tablet TAKE 1 TABLET DAILY 30 tablet 10  . LINZESS 145 MCG CAPS capsule TAKE (1) CAPSULE DAILY 30 capsule 6  . loratadine (CLARITIN) 10 MG tablet Take 10 mg by mouth daily as needed. Prn     . metFORMIN (GLUCOPHAGE) 500 MG tablet Take 1 tablet (500 mg total) by mouth every evening. 30 tablet 5  . Multiple Vitamin (MULTIVITAMIN) capsule Take 1 capsule by mouth daily.      Marland Kitchen nystatin (MYCOSTATIN) powder Apply topically 2 (two) times daily as needed.      Marland Kitchen omeprazole (PRILOSEC) 40 MG capsule TAKE (1) CAPSULE DAILY 30 capsule 4  . potassium chloride (K-DUR) 10 MEQ tablet Take 1 tablet (10 mEq total) by mouth daily. 30 tablet 5  . Probiotic Product (HEALTHY COLON PO) Take 2 capsules by mouth daily.     . sertraline (ZOLOFT) 100 MG tablet TAKE (2) TABLETS DAILY 60 tablet 5  . traMADol (ULTRAM) 50 MG tablet Take 50 mg by mouth 2 (two) times daily.     . valsartan-hydrochlorothiazide (DIOVAN-HCT) 160-25 MG per tablet TAKE 1 TABLET DAILY 30 tablet 5  . ziprasidone (GEODON) 60 MG capsule TAKE  (1)  CAPSULE  daily     Current Facility-Administered Medications on File Prior to Visit  Medication Dose Route Frequency Provider Last Rate Last Dose  . cyanocobalamin ((VITAMIN B-12)) injection 1,000 mcg  1,000 mcg  Intramuscular Q30 days Chipper Herb, MD   1,000 mcg at 01/30/15 0857      Allergies  Allergen Reactions  . Codeine     REACTION: increased BP  . Ketoconazole     REACTION: hives, swelling  . Pravachol [Pravastatin Sodium] Swelling    Throat swelling  . Statins     REACTION: leg crapms    ROS:  Out of a complete 14 system  review of symptoms, the patient complains only of the following symptoms, and all other reviewed systems are negative.  Blurred vision Wheezing, snoring Constipation Feeling hot, flushing Joint pain, joint swelling, aching muscles Allergies Headache, dizziness Depression, anxiety, not enough sleep, decreased energy, disinterest in activities Sleepiness, snoring  Blood pressure 141/82, pulse 72, height 5\' 7"  (1.702 m), weight 321 lb 3.2 oz (145.695 kg).  Physical Exam  General: The patient is alert and cooperative at the time of the examination. The patient is morbidly obese.  Eyes: Pupils are equal, round, and reactive to light. Discs are flat bilaterally.  Neck: The neck is supple, no carotid bruits are noted.  Respiratory: The respiratory examination is clear.  Cardiovascular: The cardiovascular examination reveals a regular rate and rhythm, no obvious murmurs or rubs are noted.  Neuromuscular: Range of movement of the cervical spine is full. No crepitus is noted in the temporomandibular joints.  Skin: Extremities are without significant edema.  Neurologic Exam  Mental status: The patient is alert and oriented x 3 at the time of the examination. The patient has apparent normal recent and remote memory, with an apparently normal attention span and concentration ability.  Cranial nerves: Facial symmetry is present. There is good sensation of the face to pinprick and soft touch bilaterally. The strength of the facial muscles and the muscles to head turning and shoulder shrug are normal bilaterally. Speech is well enunciated, no aphasia or  dysarthria is noted. Extraocular movements are full. Visual fields are full. The tongue is midline, and the patient has symmetric elevation of the soft palate. No obvious hearing deficits are noted.  Motor: The motor testing reveals 5 over 5 strength of all 4 extremities. Good symmetric motor tone is noted throughout.  Sensory: Sensory testing is intact to pinprick, soft touch, vibration sensation, and position sense on all 4 extremities. No evidence of extinction is noted.  Coordination: Cerebellar testing reveals good finger-nose-finger and heel-to-shin bilaterally.  Gait and station: Gait is normal. Tandem gait is normal. Romberg is negative. No drift is seen.  Reflexes: Deep tendon reflexes are symmetric and normal bilaterally. Toes are downgoing bilaterally.   Assessment/Plan:  1. Migraine headache  The patient has chronic migraine headache that have been virtually lifelong. Clinical examination today is unremarkable. The patient will be started on Topamax, working up to 75 mg at night. I have asked her to stop drinking caffeinated tea. The patient will be given Imitrex to take if needed for the headache. She will follow-up in about 4 months. If the headaches do not improve, MRI of the brain may be done in the future.  Jill Alexanders MD 02/04/2015 9:15 PM  [facility] [address] [place], [place] [address]  Phone [phone] Fax [phone]

## 2015-02-04 NOTE — Patient Instructions (Signed)
Topamax (topiramate) is a seizure medication that has an FDA approval for seizures and for migraine headache. Potential side effects of this medication include weight loss, cognitive slowing, tingling in the fingers and toes, and carbonated drinks will taste bad. If any significant side effects are noted on this drug, please contact our office.  Migraine Headache A migraine headache is an intense, throbbing pain on one or both sides of your head. A migraine can last for 30 minutes to several hours. CAUSES  The exact cause of a migraine headache is not always known. However, a migraine may be caused when nerves in the brain become irritated and release chemicals that cause inflammation. This causes pain. Certain things may also trigger migraines, such as:  Alcohol.  Smoking.  Stress.  Menstruation.  Aged cheeses.  Foods or drinks that contain nitrates, glutamate, aspartame, or tyramine.  Lack of sleep.  Chocolate.  Caffeine.  Hunger.  Physical exertion.  Fatigue.  Medicines used to treat chest pain (nitroglycerine), birth control pills, estrogen, and some blood pressure medicines. SIGNS AND SYMPTOMS  Pain on one or both sides of your head.  Pulsating or throbbing pain.  Severe pain that prevents daily activities.  Pain that is aggravated by any physical activity.  Nausea, vomiting, or both.  Dizziness.  Pain with exposure to bright lights, loud noises, or activity.  General sensitivity to bright lights, loud noises, or smells. Before you get a migraine, you may get warning signs that a migraine is coming (aura). An aura may include:  Seeing flashing lights.  Seeing bright spots, halos, or zigzag lines.  Having tunnel vision or blurred vision.  Having feelings of numbness or tingling.  Having trouble talking.  Having muscle weakness. DIAGNOSIS  A migraine headache is often diagnosed based on:  Symptoms.  Physical exam.  A CT scan or MRI of your  head. These imaging tests cannot diagnose migraines, but they can help rule out other causes of headaches. TREATMENT Medicines may be given for pain and nausea. Medicines can also be given to help prevent recurrent migraines.  HOME CARE INSTRUCTIONS  Only take over-the-counter or prescription medicines for pain or discomfort as directed by your health care provider. The use of long-term narcotics is not recommended.  Lie down in a dark, quiet room when you have a migraine.  Keep a journal to find out what may trigger your migraine headaches. For example, write down:  What you eat and drink.  How much sleep you get.  Any change to your diet or medicines.  Limit alcohol consumption.  Quit smoking if you smoke.  Get 7-9 hours of sleep, or as recommended by your health care provider.  Limit stress.  Keep lights dim if bright lights bother you and make your migraines worse. SEEK IMMEDIATE MEDICAL CARE IF:   Your migraine becomes severe.  You have a fever.  You have a stiff neck.  You have vision loss.  You have muscular weakness or loss of muscle control.  You start losing your balance or have trouble walking.  You feel faint or pass out.  You have severe symptoms that are different from your first symptoms. MAKE SURE YOU:   Understand these instructions.  Will watch your condition.  Will get help right away if you are not doing well or get worse. Document Released: 11/28/2005 Document Revised: 04/14/2014 Document Reviewed: 08/05/2013 [facility] Patient Information 2015 [place], [place]. This information is not intended to replace advice given to you by your  health care provider. Make sure you discuss any questions you have with your health care provider.

## 2015-02-09 ENCOUNTER — Other Ambulatory Visit: Payer: Self-pay | Admitting: Family Medicine

## 2015-02-23 ENCOUNTER — Telehealth: Payer: Self-pay | Admitting: Neurology

## 2015-02-23 MED ORDER — TOPIRAMATE 50 MG PO TABS: 150.0000 mg | ORAL_TABLET | Freq: Every day | ORAL | Status: DC

## 2015-02-23 NOTE — Telephone Encounter (Signed)
Patient stated she's had 13 migraines since last seen.  Rx's topiramate (TOPAMAX) 25 MG tablet and SUMAtriptan (IMITREX) 100 MG tablet isn't beneficial.  Patient stated she has a migraine today.  Please call and advise.

## 2015-02-23 NOTE — Telephone Encounter (Signed)
I called patient. The patient is still having almost daily headaches. She is taking 75 mg of Topamax at night, she is tolerating medication, but she is not on a maximum dose. We will convert to the 50 mg tablets, the patient will begin taking 100 mg at night now 1 week, and she will convert to 150 mg at night.

## 2015-02-27 DIAGNOSIS — G4733 Obstructive sleep apnea (adult) (pediatric): Secondary | ICD-10-CM | POA: Diagnosis not present

## 2015-02-27 DIAGNOSIS — R0902 Hypoxemia: Secondary | ICD-10-CM | POA: Diagnosis not present

## 2015-03-02 ENCOUNTER — Ambulatory Visit (INDEPENDENT_AMBULATORY_CARE_PROVIDER_SITE_OTHER): Payer: Medicare Other | Admitting: *Deleted

## 2015-03-02 DIAGNOSIS — E538 Deficiency of other specified B group vitamins: Secondary | ICD-10-CM | POA: Diagnosis not present

## 2015-03-02 NOTE — Progress Notes (Signed)
Vitamin b12 injection given and tolerated well.  

## 2015-03-02 NOTE — Patient Instructions (Signed)

## 2015-03-04 ENCOUNTER — Telehealth: Payer: Self-pay | Admitting: *Deleted

## 2015-03-04 MED ORDER — FENOFIBRATE MICRONIZED 134 MG PO CAPS: 134.0000 mg | ORAL_CAPSULE | Freq: Every day | ORAL | Status: DC

## 2015-03-04 NOTE — Telephone Encounter (Signed)
Dr Sabra Heck ins wants fenofibrate 134 mg instead of antara, am not sure what the big difference is in fenofibrate 130 and 134 but apparently a lot to ins.  Could you change this and if so please let us know. thanks

## 2015-03-09 DIAGNOSIS — G4733 Obstructive sleep apnea (adult) (pediatric): Secondary | ICD-10-CM | POA: Diagnosis not present

## 2015-03-10 ENCOUNTER — Telehealth: Payer: Self-pay | Admitting: Neurology

## 2015-03-10 DIAGNOSIS — G4489 Other headache syndrome: Secondary | ICD-10-CM

## 2015-03-10 MED ORDER — PROPRANOLOL HCL 10 MG PO TABS: ORAL_TABLET | ORAL | Status: DC

## 2015-03-10 NOTE — Telephone Encounter (Signed)
I called the patient. The patient continues to have frequent headaches. She is on 150 mg Topamax without benefit. I will taper her down off of the Topamax by 50 mg every 2 weeks until she stopped the drug. I will start propranolol for the headache, and get her set up for MRI evaluation of the brain.

## 2015-03-10 NOTE — Telephone Encounter (Signed)
Patient stated150 mg increase of  Rx topiramate (TOPAMAX) 50 MG tablet has not helped with headaches, since increase she has had 8 headaches.  Please call and advise.

## 2015-03-11 ENCOUNTER — Other Ambulatory Visit: Payer: Self-pay | Admitting: General Practice

## 2015-03-11 ENCOUNTER — Other Ambulatory Visit: Payer: Self-pay | Admitting: Nurse Practitioner

## 2015-03-11 ENCOUNTER — Other Ambulatory Visit: Payer: Self-pay | Admitting: Family Medicine

## 2015-03-12 NOTE — Telephone Encounter (Signed)
Fenofibrate was changed to 134mg .

## 2015-03-23 ENCOUNTER — Other Ambulatory Visit: Payer: Self-pay | Admitting: Neurology

## 2015-03-25 DIAGNOSIS — E119 Type 2 diabetes mellitus without complications: Secondary | ICD-10-CM | POA: Diagnosis not present

## 2015-03-25 DIAGNOSIS — H40033 Anatomical narrow angle, bilateral: Secondary | ICD-10-CM | POA: Diagnosis not present

## 2015-03-25 LAB — HM DIABETES EYE EXAM

## 2015-03-29 ENCOUNTER — Ambulatory Visit
Admission: RE | Admit: 2015-03-29 | Discharge: 2015-03-29 | Disposition: A | Discharge status: Home / Self Care | Payer: Self-pay | Source: Ambulatory Visit | Attending: Neurology | Admitting: Neurology

## 2015-03-29 DIAGNOSIS — G4489 Other headache syndrome: Secondary | ICD-10-CM

## 2015-03-30 ENCOUNTER — Encounter: Payer: Self-pay | Admitting: *Deleted

## 2015-03-30 DIAGNOSIS — G4733 Obstructive sleep apnea (adult) (pediatric): Secondary | ICD-10-CM | POA: Diagnosis not present

## 2015-03-30 DIAGNOSIS — R0902 Hypoxemia: Secondary | ICD-10-CM | POA: Diagnosis not present

## 2015-03-31 ENCOUNTER — Other Ambulatory Visit: Payer: Self-pay | Admitting: Family Medicine

## 2015-03-31 ENCOUNTER — Telehealth: Payer: Self-pay | Admitting: Neurology

## 2015-03-31 NOTE — Telephone Encounter (Signed)
  MRI is normal. The results were released on Mychart   MRI brain 03/30/15:  IMPRESSION: This is a normal noncontrasted MRI of the brain. There are no acute findings.

## 2015-04-03 ENCOUNTER — Ambulatory Visit (INDEPENDENT_AMBULATORY_CARE_PROVIDER_SITE_OTHER): Payer: Medicare Other | Admitting: *Deleted

## 2015-04-03 ENCOUNTER — Telehealth: Payer: Self-pay

## 2015-04-03 DIAGNOSIS — E538 Deficiency of other specified B group vitamins: Secondary | ICD-10-CM | POA: Diagnosis not present

## 2015-04-03 MED ORDER — PROPRANOLOL HCL 40 MG PO TABS: 40.0000 mg | ORAL_TABLET | Freq: Two times a day (BID) | ORAL | Status: DC

## 2015-04-03 MED ORDER — ISOMETHEPTENE-DICHLORAL-APAP 65-100-325 MG PO CAPS: 1.0000 | ORAL_CAPSULE | Freq: Four times a day (QID) | ORAL | Status: DC | PRN

## 2015-04-03 NOTE — Telephone Encounter (Signed)
I called patient. MRI the brain was normal. She still having a lot of headaches. She is off the Topamax, on propranolol. The propranolol is helping slightly, she is only on 20 mg twice daily. I will go up to 40 milligrams twice daily. The Imitrex does not help her, we will switch to Midrin to see if this helps.

## 2015-04-03 NOTE — Patient Instructions (Signed)

## 2015-04-03 NOTE — Progress Notes (Signed)
Vitamin b12 given and tolerated well. 

## 2015-04-03 NOTE — Telephone Encounter (Signed)
Patient called wondering what her MRI results were. I relayed that her MRI was normal. She stated that she wanted Dr. Jannifer Franklin to know that she has had 11 migraines since 03/11/15. The medications she is taking do not seem to help.

## 2015-04-13 ENCOUNTER — Telehealth: Payer: Self-pay | Admitting: Neurology

## 2015-04-13 MED ORDER — CHLORPROMAZINE HCL 50 MG PO TABS: 50.0000 mg | ORAL_TABLET | Freq: Two times a day (BID) | ORAL | Status: DC

## 2015-04-13 NOTE — Telephone Encounter (Signed)
I called patient. She has had a headache for about 4 days. She has been on an increased dose of Inderal for about 10 days. In the future, she may be a candidate for Botox injections. I will give her a 3 day course of Thorazine taking 50 mg 2 times daily.

## 2015-04-13 NOTE — Telephone Encounter (Signed)
Patient is calling in regard to Rx Inderal 40 mg 2 x day.  She has been taking since 4/23 and has had constant headaches and pain in neck.  Please call.

## 2015-04-15 ENCOUNTER — Telehealth: Payer: Self-pay

## 2015-04-15 NOTE — Telephone Encounter (Signed)
Blenda Peals at 04/15/2015      Status: Signed       Expand All Collapse All   Patient called stating that the patient started taking Thorazine yesterday and is still having headaches. She also wants to know if she needs to stop taking her other meds.  Please call and advice # [phone]

## 2015-04-15 NOTE — Telephone Encounter (Signed)
I called patient. The Thorazine makes her somewhat drowsy, but it does seem to be helping the headache. She is to continue her propranolol, she is not to stop that medication.

## 2015-04-15 NOTE — Telephone Encounter (Signed)
Patient called stating that the patient started taking Thorazine yesterday and is still having headaches. She also wants to know if she needs to stop taking her other meds.  Please call and advice # [phone]

## 2015-04-16 ENCOUNTER — Other Ambulatory Visit: Payer: Self-pay | Admitting: Family Medicine

## 2015-04-29 DIAGNOSIS — R0902 Hypoxemia: Secondary | ICD-10-CM | POA: Diagnosis not present

## 2015-04-29 DIAGNOSIS — G4733 Obstructive sleep apnea (adult) (pediatric): Secondary | ICD-10-CM | POA: Diagnosis not present

## 2015-05-01 ENCOUNTER — Encounter: Payer: Self-pay | Admitting: Family Medicine

## 2015-05-01 ENCOUNTER — Ambulatory Visit (INDEPENDENT_AMBULATORY_CARE_PROVIDER_SITE_OTHER): Payer: Medicare Other | Admitting: Family Medicine

## 2015-05-01 VITALS — BP 128/75 | HR 61 | Temp 97.1°F | Ht 64.5 in | Wt 324.8 lb

## 2015-05-01 DIAGNOSIS — H65191 Other acute nonsuppurative otitis media, right ear: Secondary | ICD-10-CM | POA: Diagnosis not present

## 2015-05-01 DIAGNOSIS — Z0000 Encounter for general adult medical examination without abnormal findings: Secondary | ICD-10-CM

## 2015-05-01 DIAGNOSIS — Z0189 Encounter for other specified special examinations: Secondary | ICD-10-CM

## 2015-05-01 NOTE — Patient Instructions (Signed)
Continue current medications. Continue good therapeutic lifestyle changes which include good diet and exercise. Fall precautions discussed with patient. If an FOBT was given today- please return it to our front desk. If you are over 48 years old - you may need Prevnar 13 or the adult Pneumonia vaccine.   After your visit with us today you will receive a survey in the mail or online from [employer] regarding your care with us. Please take a moment to fill this out. Your feedback is very important to us as you can help us better understand your patient needs as well as improve your experience and satisfaction. WE CARE ABOUT YOU!!!    

## 2015-05-01 NOTE — Progress Notes (Signed)
Subjective:    Patient ID: Morgan [name], female    DOB: 07-Sep-1966, 49 y.o.   MRN: [id number]  HPI  Pt is here today for annual wellness exam. Actually, her wellness exam is due for July which time we need to repeat her lab work, lipids and blood sugar since she was prediabetic. Today we talked about migraines. She is in communication with Dr. Jannifer Franklin, neurologist trying to get her migraines regulated. She says that occur as often as every other day. She takes propranolol and Imitrex with occasional tramadol, the latter which is not very effective. Today she is also complaining of some fullness in her ears. Not had any fever or pain.           Patient Active Problem List   Diagnosis Date Noted  . Pancreatitis 02/27/2014  . Fatty liver disease, nonalcoholic 63/33/5456  . Bipolar disorder 02/20/2014  . Metabolic syndrome 25/63/8937  . Vitamin D deficiency   . Morbid obesity 03/12/2013  . DM (diabetes mellitus) 03/12/2013  . HLD (hyperlipidemia) 03/12/2013  . Preoperative evaluation to rule out surgical contraindication 06/12/2011  . Unspecified hypothyroidism   . Constipation, chronic   . Vitamin B 12 deficiency   . ALLERGIC RHINITIS 12/31/2010  . BARRETTS ESOPHAGUS 12/31/2010  . Gastroparesis 12/31/2010  . OSTEOARTHRITIS 12/31/2010  . OBSTRUCTIVE SLEEP APNEA 09/27/2010  . Migraine 09/27/2010  . HYPERTENSION 09/27/2010  . RESPIRATORY FAILURE, CHRONIC 09/27/2010   Outpatient Encounter Prescriptions as of 05/01/2015  Medication Sig  . chlorproMAZINE (THORAZINE) 50 MG tablet Take 1 tablet (50 mg total) by mouth 2 (two) times daily.  . Cholecalciferol (VITAMIN D) 2000 UNITS CAPS Take 2,000 Units by mouth daily.  Marland Kitchen etodolac (LODINE) 500 MG tablet Take 1 tablet (500 mg total) by mouth 2 (two) times daily.  Marland Kitchen ezetimibe (ZETIA) 10 MG tablet Take 1 tablet (10 mg total) by mouth daily.  . fenofibrate micronized (LOFIBRA) 134 MG capsule Take 1 capsule (134 mg total) by mouth  daily before breakfast.  . ferrous gluconate (FERGON) 325 MG tablet Take 325 mg by mouth daily with breakfast.    . fluticasone (FLONASE) 50 MCG/ACT nasal spray SPRAY 1 SPRAY IN EACH NOSTRIL TWICE DAILY. SHAKE GENTLY BEFORE EACH USE.  . furosemide (LASIX) 20 MG tablet TAKE 1 TABLET DAILY  . glucose blood (ONE TOUCH ULTRA TEST) test strip Test 2 times daily  . levothyroxine (SYNTHROID, LEVOTHROID) 150 MCG tablet TAKE 1 TABLET DAILY  . LINZESS 145 MCG CAPS capsule TAKE (1) CAPSULE DAILY  . loratadine (CLARITIN) 10 MG tablet Take 10 mg by mouth daily as needed. Prn   . metFORMIN (GLUCOPHAGE) 500 MG tablet Take 1 tablet (500 mg total) by mouth every evening.  . Multiple Vitamin (MULTIVITAMIN) capsule Take 1 capsule by mouth daily.    Marland Kitchen nystatin (MYCOSTATIN) powder Apply topically 2 (two) times daily as needed.    Marland Kitchen omeprazole (PRILOSEC) 40 MG capsule TAKE (1) CAPSULE DAILY  . potassium chloride (K-DUR) 10 MEQ tablet Take 1 tablet (10 mEq total) by mouth daily.  . Probiotic Product (HEALTHY COLON PO) Take 2 capsules by mouth daily.   . propranolol (INDERAL) 40 MG tablet Take 1 tablet (40 mg total) by mouth 2 (two) times daily.  . sertraline (ZOLOFT) 100 MG tablet TAKE (2) TABLETS DAILY  . traMADol (ULTRAM) 50 MG tablet Take 50 mg by mouth 2 (two) times daily.   . valsartan-hydrochlorothiazide (DIOVAN-HCT) 160-25 MG per tablet TAKE 1 TABLET DAILY  . ziprasidone (GEODON)  60 MG capsule TAKE  (1)  CAPSULE  daily  . ziprasidone (GEODON) 60 MG capsule Take 1 capsule (60 mg total) by mouth every evening.  . [DISCONTINUED] isometheptene-acetaminophen-dichloralphenazone (MIDRIN) 65-100-325 MG capsule Take 1 capsule by mouth 4 (four) times daily as needed for migraine. Maximum 5 capsules in 12 hours for migraine headaches, 8 capsules in 24 hours for tension headaches. (Patient not taking: Reported on 05/01/2015)   Facility-Administered Encounter Medications as of 05/01/2015  Medication  . cyanocobalamin  ((VITAMIN B-12)) injection 1,000 mcg       Review of Systems  Constitutional: Positive for fatigue.  HENT: Positive for ear pain (bilateral, pressure) and rhinorrhea.   Eyes: Negative.   Respiratory: Negative.   Cardiovascular: Positive for leg swelling (intermitent).  Gastrointestinal: Negative.   Genitourinary: Negative.   Musculoskeletal: Positive for arthralgias (bilateral knees).  Neurological: Positive for headaches (migraines, pt sees Dr Jannifer Franklin).  Psychiatric/Behavioral: Positive for sleep disturbance (occasional).       Objective:   Physical Exam  Constitutional: She appears well-developed and well-nourished.  HENT:  Right TM is dull and does not move with Valsalva maneuver  Cardiovascular: Normal rate and regular rhythm.   Pulmonary/Chest: Effort normal.  Musculoskeletal:  There is tenderness involving the left knee. She sees orthopedist and will eventually probably need total knee replacement. We spent some time talking about weight reduction and how that could help her knees as well as her lipids and sugar   BP 128/75 mmHg  Pulse 61  Temp(Src) 97.1 F (36.2 C) (Oral)  Ht 5' 4.5" (1.638 m)  Wt 324 lb 12.8 oz (147.328 kg)  BMI 54.91 kg/m2        Assessment & Plan:  1. Wellness examination Will return in July for exam  2. Acute nonsuppurative otitis media of right ear There is no sign of infection at this point. Patient instructed on Valsalva maneuver which she will do 6 times a day and also begin Flonase once a day. If pain or fever ensue may need an antibiotic.  Wardell Honour MD

## 2015-05-04 ENCOUNTER — Ambulatory Visit: Payer: Medicare Other

## 2015-05-07 ENCOUNTER — Ambulatory Visit (INDEPENDENT_AMBULATORY_CARE_PROVIDER_SITE_OTHER): Payer: Medicare Other | Admitting: *Deleted

## 2015-05-07 DIAGNOSIS — E538 Deficiency of other specified B group vitamins: Secondary | ICD-10-CM

## 2015-05-07 NOTE — Patient Instructions (Signed)

## 2015-05-07 NOTE — Progress Notes (Signed)
Pt given B12 injection IM right deltoid and tolerated well. 

## 2015-05-13 ENCOUNTER — Other Ambulatory Visit: Payer: Self-pay | Admitting: Family Medicine

## 2015-05-13 DIAGNOSIS — N939 Abnormal uterine and vaginal bleeding, unspecified: Secondary | ICD-10-CM | POA: Insufficient documentation

## 2015-05-13 NOTE — Telephone Encounter (Signed)
Last seen 05/01/15 Dr Sabra Heck last thyroid level 02/20/14

## 2015-05-28 ENCOUNTER — Other Ambulatory Visit: Payer: Self-pay | Admitting: Family Medicine

## 2015-05-28 NOTE — Telephone Encounter (Signed)
Last seen 05/01/15   Dr Sabra Heck

## 2015-05-29 ENCOUNTER — Other Ambulatory Visit: Payer: Self-pay

## 2015-05-29 DIAGNOSIS — Z124 Encounter for screening for malignant neoplasm of cervix: Secondary | ICD-10-CM | POA: Diagnosis not present

## 2015-05-29 DIAGNOSIS — Z1231 Encounter for screening mammogram for malignant neoplasm of breast: Secondary | ICD-10-CM | POA: Diagnosis not present

## 2015-05-29 DIAGNOSIS — Z01419 Encounter for gynecological examination (general) (routine) without abnormal findings: Secondary | ICD-10-CM | POA: Diagnosis not present

## 2015-05-30 DIAGNOSIS — R0902 Hypoxemia: Secondary | ICD-10-CM | POA: Diagnosis not present

## 2015-05-30 DIAGNOSIS — G4733 Obstructive sleep apnea (adult) (pediatric): Secondary | ICD-10-CM | POA: Diagnosis not present

## 2015-06-01 LAB — CYTOLOGY - PAP

## 2015-06-03 ENCOUNTER — Other Ambulatory Visit: Payer: Self-pay | Admitting: Orthopedic Surgery

## 2015-06-03 DIAGNOSIS — M542 Cervicalgia: Secondary | ICD-10-CM | POA: Diagnosis not present

## 2015-06-04 ENCOUNTER — Encounter: Payer: Self-pay | Admitting: Neurology

## 2015-06-04 ENCOUNTER — Ambulatory Visit (INDEPENDENT_AMBULATORY_CARE_PROVIDER_SITE_OTHER): Payer: Medicare Other | Admitting: Neurology

## 2015-06-04 VITALS — BP 141/77 | HR 62 | Ht 66.0 in | Wt 322.6 lb

## 2015-06-04 DIAGNOSIS — G43719 Chronic migraine without aura, intractable, without status migrainosus: Secondary | ICD-10-CM | POA: Diagnosis not present

## 2015-06-04 HISTORY — DX: Chronic migraine without aura, intractable, without status migrainosus: G43.719

## 2015-06-04 MED ORDER — PROPRANOLOL HCL 60 MG PO TABS: 60.0000 mg | ORAL_TABLET | Freq: Two times a day (BID) | ORAL | Status: DC

## 2015-06-04 MED ORDER — ISOMETHEPTENE-DICHLORAL-APAP 65-100-325 MG PO CAPS
1.0000 | ORAL_CAPSULE | Freq: Four times a day (QID) | ORAL | Status: DC | PRN
Start: 2015-06-04 — End: 2015-06-05

## 2015-06-04 NOTE — Patient Instructions (Signed)

## 2015-06-04 NOTE — Progress Notes (Signed)
Reason for visit: Intractable migraine headache  Morgan [name] is an 49 y.o. female  History of present illness:  Morgan [name] is a 49 year old right-handed white female with a history of morbid obesity and sleep apnea. She has had migraine headache on a regular basis since she was 51 or 49 years old. More recently, she had MRI evaluation of the brain that was unremarkable. The patient has returned to the office today continuing to complain of at least 15 migraine headache days a month. The patient has been on Topamax, working up to 150 mg daily, with no benefit whatsoever. She was tapered off of the Topamax. She is on valsartan and Zoloft, without any benefit with her headache. The patient has been given a trial on acetaminophen and sumatriptan and as a rescue drug, this has not been helpful. The patient has also used Thorazine as a rescue drug with some benefit. The patient also takes Geodon on a regular basis. She has been placed on propranolol, but the headaches continue at a frequent rate. The headaches have become shorter in duration, however. The patient was having headaches that would last 2 or 3 days, but this is no longer the case. The propranolol has reduced the duration of the headaches to about 3 or 4 hours. The patient has to lie down with the headache, she may have nausea but no vomiting. She may have spots in front of the eyes, photophobia and phonophobia. Heat and certain odors make activate the headache. The patient returns to the office today continuing to complain of intractable migraine headache. She has gone off of the sumatriptan as this was not beneficial whatsoever. The patient more recently has had a lot of neck pain, she is followed through an orthopedic physician, they are concerned about possible nerve root impingement.  Past Medical History  Diagnosis Date  . Unspecified hypothyroidism   . Constipation, chronic   . Vitamin B 12 deficiency   . Barrett's esophagus   .  Osteoarthritis     bilateral knee  . Gastroparesis   . Allergic rhinitis   . Chronic respiratory failure   . Migraine headache   . Hypertension   . Diabetes mellitus without complication   . GERD (gastroesophageal reflux disease)   . OSA (obstructive sleep apnea)     uses a cpap  . Sleep apnea     has c-pap  . Vitamin D deficiency   . Bipolar affective disorder   . Hyperlipidemia   . Morbid obesity   . Intractable chronic migraine without aura 06/04/2015    Past Surgical History  Procedure Laterality Date  . Cholecystectomy  2005  . Appendectomy  1978  . Tonsillectomy  at age 3  . Shoulder arthroscopy  7/12    left-dsc  . Trigger finger release  12/20/2012    Procedure: RELEASE TRIGGER FINGER/A-1 PULLEY;  Surgeon: Tennis Must, MD;  Location: [place];  Service: Orthopedics;  Laterality: Left;  LEFT TRIGGER THUMB RELEASE    Family History  Problem Relation Age of Onset  . Asthma Father   . Allergies Father   . Heart disease Father     enlarged heart  . Peripheral vascular disease Father   . Diabetes Father   . Hyperlipidemia Father   . Arthritis Father   . Asthma Sister   . Cancer Sister     colon at 41 yr old.  . Colon cancer Sister   . Allergies Mother   .  Stroke Mother 87    with hemi paralysis  . Diabetes Mother   . Hyperlipidemia Mother   . Hypertension Mother   . GI problems Mother   . Arthritis Mother   . Allergies Brother   . Early death Brother 9    congenital abormality  . Allergies Sister   . Diabetes Sister   . Asthma Sister   . Colon polyps Sister   . Hyperlipidemia Sister   . GI problems Sister     gastroporesis   . Liver disease Sister     fatty liver  . Diabetes Daughter   . Hyperlipidemia Daughter   . Hypertension Daughter   . Migraines Neg Hx     Social history:  reports that she has never smoked. She has never used smokeless tobacco. She reports that she does not drink alcohol or use illicit drugs.      Allergies  Allergen Reactions  . Codeine     REACTION: increased BP  . Ketoconazole     REACTION: hives, swelling  . Pravachol [Pravastatin Sodium] Swelling    Throat swelling  . Statins     REACTION: leg crapms    Medications:  Prior to Admission medications   Medication Sig Start Date End Date Taking? Authorizing Provider  chlorproMAZINE (THORAZINE) 50 MG tablet Take 1 tablet (50 mg total) by mouth 2 (two) times daily. 04/13/15  Yes Kathrynn Ducking, MD  Cholecalciferol (VITAMIN D) 2000 UNITS CAPS Take 2,000 Units by mouth daily.   Yes Historical Provider, MD  etodolac (LODINE) 500 MG tablet Take 1 tablet (500 mg total) by mouth 2 (two) times daily. 07/18/14  Yes Chipper Herb, MD  ezetimibe (ZETIA) 10 MG tablet Take 1 tablet (10 mg total) by mouth daily. 09/03/14  Yes Tammy Eckard, PHARMD  fenofibrate micronized (LOFIBRA) 134 MG capsule Take 1 capsule (134 mg total) by mouth daily before breakfast. 03/04/15  Yes Wardell Honour, MD  ferrous gluconate (FERGON) 325 MG tablet Take 325 mg by mouth daily with breakfast.     Yes Historical Provider, MD  fluticasone (FLONASE) 50 MCG/ACT nasal spray SPRAY 1 SPRAY IN EACH NOSTRIL TWICE DAILY. SHAKE GENTLY BEFORE EACH USE. 12/10/14  Yes Wardell Honour, MD  furosemide (LASIX) 20 MG tablet TAKE 1 TABLET DAILY 03/12/15  Yes Wardell Honour, MD  glucose blood (ONE TOUCH ULTRA TEST) test strip Test 2 times daily 04/20/15  Yes Wardell Honour, MD  levothyroxine (SYNTHROID, LEVOTHROID) 150 MCG tablet TAKE 1 TABLET DAILY 05/14/15  Yes Wardell Honour, MD  LINZESS 145 MCG CAPS capsule TAKE (1) CAPSULE DAILY 03/12/15  Yes Wardell Honour, MD  loratadine (CLARITIN) 10 MG tablet Take 10 mg by mouth daily as needed. Prn    Yes Historical Provider, MD  metFORMIN (GLUCOPHAGE) 500 MG tablet Take 1 tablet (500 mg total) by mouth every evening. 12/10/14  Yes Wardell Honour, MD  Multiple Vitamin (MULTIVITAMIN) capsule Take 1 capsule by mouth daily.     Yes  Historical Provider, MD  nystatin (MYCOSTATIN) powder Apply topically 2 (two) times daily as needed.     Yes Historical Provider, MD  omeprazole (PRILOSEC) 40 MG capsule TAKE (1) CAPSULE DAILY 03/12/15  Yes Wardell Honour, MD  potassium chloride (K-DUR) 10 MEQ tablet Take 1 tablet (10 mEq total) by mouth daily. 12/10/14  Yes Wardell Honour, MD  predniSONE (DELTASONE) 10 MG tablet Take by mouth. Follow taper instructions 06/03/15  Yes Historical Provider, MD  Probiotic Product (HEALTHY COLON PO) Take 2 capsules by mouth daily.    Yes Historical Provider, MD  propranolol (INDERAL) 40 MG tablet Take 1 tablet (40 mg total) by mouth 2 (two) times daily. 04/03/15  Yes Kathrynn Ducking, MD  sertraline (ZOLOFT) 100 MG tablet TAKE (2) TABLETS DAILY 12/10/14  Yes Wardell Honour, MD  traMADol (ULTRAM) 50 MG tablet Take 50 mg by mouth 2 (two) times daily.  02/27/13  Yes Historical Provider, MD  valsartan-hydrochlorothiazide (DIOVAN-HCT) 160-25 MG per tablet TAKE 1 TABLET DAILY 12/10/14  Yes Wardell Honour, MD  ziprasidone (GEODON) 60 MG capsule TAKE  (1)  CAPSULE  daily   Yes Chipper Herb, MD  ziprasidone (GEODON) 60 MG capsule TAKE 1 CAPSULE IN THE EVENING 05/29/15  Yes Wardell Honour, MD    ROS:  Out of a complete 14 system review of symptoms, the patient complains only of the following symptoms, and all other reviewed systems are negative.  Excessive sweating Light sensitivity, blurred vision Swollen abdomen, constipation Sleep apnea, snoring Environmental allergies Neck pain Anemia Dizziness, headache Depression, anxiety  Blood pressure 141/77, pulse 62, height 5\' 6"  (1.676 m), weight 322 lb 9.6 oz (146.33 kg).  Physical Exam  General: The patient is alert and cooperative at the time of the examination. The patient is morbidly obese.  Skin: No significant peripheral edema is noted.   Neurologic Exam  Mental status: The patient is alert and oriented x 3 at the time of the  examination. The patient has apparent normal recent and remote memory, with an apparently normal attention span and concentration ability.   Cranial nerves: Facial symmetry is present. Speech is normal, no aphasia or dysarthria is noted. Extraocular movements are full. Visual fields are full.  Motor: The patient has good strength in all 4 extremities.  Sensory examination: Soft touch sensation is symmetric on the face, arms, and legs.  Coordination: The patient has good finger-nose-finger and heel-to-shin bilaterally.  Gait and station: The patient has a normal gait. Tandem gait is slightly unsteady. Romberg is negative. No drift is seen.  Reflexes: Deep tendon reflexes are symmetric.   Assessment/Plan:  1. Intractable migraine headache, G43.719  2. Morbid obesity  3. Sleep apnea  The patient has had ongoing severe headaches. The patient continues to have 15 or more headache days a month. The propranolol will be increased to 60 mg twice daily. The patient will be taken off of the sumatriptan, and placed on Midrin to take if needed for the headache. She has failed multiple drugs for her migraine, I have discussed with her the possibility of using Botox injections for migraine, she is amenable to this. I will try to initiate this therapy. The patient will follow-up in 4 months, she may be seen sooner for the Botox injections if this is approved.  Jill Alexanders MD 06/04/2015 8:11 PM  [facility] [address] [place], [place] [address]  Phone [phone] Fax [phone]

## 2015-06-05 ENCOUNTER — Telehealth: Payer: Self-pay

## 2015-06-05 MED ORDER — ZOLMITRIPTAN 2.5 MG NA SOLN: 1.0000 | Freq: Two times a day (BID) | NASAL | Status: DC | PRN

## 2015-06-05 NOTE — Telephone Encounter (Signed)
Midrin apparently is too expensive. I will try the patient on Zomig nasal spray.

## 2015-06-05 NOTE — Telephone Encounter (Signed)
Morgan [name] sent a fax stating the patient is requesting a drug change because Midrin is too expensive.  Please advise.  Thank you.

## 2015-06-08 ENCOUNTER — Ambulatory Visit (INDEPENDENT_AMBULATORY_CARE_PROVIDER_SITE_OTHER): Payer: Medicare Other | Admitting: *Deleted

## 2015-06-08 DIAGNOSIS — E538 Deficiency of other specified B group vitamins: Secondary | ICD-10-CM

## 2015-06-08 NOTE — Progress Notes (Signed)
Patient tolerated well.

## 2015-06-09 ENCOUNTER — Other Ambulatory Visit: Payer: Self-pay | Admitting: Family Medicine

## 2015-06-09 NOTE — Telephone Encounter (Signed)
Last seen 05/01/15  Dr Sabra Heck  Last lipid 08/29/14

## 2015-06-09 NOTE — Telephone Encounter (Signed)
It appears the provider sent Rx for Zomig.  I called the pharmacy who verified they did receive the prescription, however, ins will not pay for it.  I contacted ins to ask that they grant an exception, request is currently under review Ref # [id number].  I called back and spoke with the patient.  Relayed the info above.  She expressed understanding and will wait to hear from ins regarding determination.

## 2015-06-09 NOTE — Telephone Encounter (Signed)
Last seen 05/01/15 Dr Sabra Heck  Last thyroid level 02/20/14

## 2015-06-09 NOTE — Telephone Encounter (Signed)
Patient called stating pharmacy has not received medication and she does not know what medication has been replaced by Midrin. Please call and advise.

## 2015-06-10 ENCOUNTER — Telehealth: Payer: Self-pay

## 2015-06-10 NOTE — Telephone Encounter (Signed)
Request has been approved.  Ins indicates the patient has been notified.

## 2015-06-10 NOTE — Telephone Encounter (Signed)
[facility] [place]) has approved the request for coverage on Zomig effective until 12/12/2015 Ref # [id number]

## 2015-06-13 ENCOUNTER — Ambulatory Visit
Admission: RE | Admit: 2015-06-13 | Discharge: 2015-06-13 | Disposition: A | Discharge status: Home / Self Care | Payer: Medicare Other | Source: Ambulatory Visit | Attending: Orthopedic Surgery | Admitting: Orthopedic Surgery

## 2015-06-13 DIAGNOSIS — M542 Cervicalgia: Secondary | ICD-10-CM

## 2015-06-13 DIAGNOSIS — R200 Anesthesia of skin: Secondary | ICD-10-CM | POA: Diagnosis not present

## 2015-06-17 ENCOUNTER — Ambulatory Visit (INDEPENDENT_AMBULATORY_CARE_PROVIDER_SITE_OTHER): Payer: Medicare Other | Admitting: Family Medicine

## 2015-06-17 ENCOUNTER — Encounter: Payer: Self-pay | Admitting: Family Medicine

## 2015-06-17 VITALS — BP 119/71 | HR 53 | Temp 97.3°F | Ht 66.0 in | Wt 317.0 lb

## 2015-06-17 DIAGNOSIS — I10 Essential (primary) hypertension: Secondary | ICD-10-CM | POA: Diagnosis not present

## 2015-06-17 DIAGNOSIS — E039 Hypothyroidism, unspecified: Secondary | ICD-10-CM

## 2015-06-17 DIAGNOSIS — E785 Hyperlipidemia, unspecified: Secondary | ICD-10-CM | POA: Diagnosis not present

## 2015-06-17 DIAGNOSIS — E119 Type 2 diabetes mellitus without complications: Secondary | ICD-10-CM | POA: Diagnosis not present

## 2015-06-17 LAB — POCT GLYCOSYLATED HEMOGLOBIN (HGB A1C): Hemoglobin A1C: 7.1

## 2015-06-17 MED ORDER — SERTRALINE HCL 100 MG PO TABS: 200.0000 mg | ORAL_TABLET | Freq: Every day | ORAL | Status: DC

## 2015-06-17 MED ORDER — ZIPRASIDONE HCL 60 MG PO CAPS: ORAL_CAPSULE | ORAL | Status: DC

## 2015-06-17 MED ORDER — EZETIMIBE 10 MG PO TABS: 10.0000 mg | ORAL_TABLET | Freq: Every day | ORAL | Status: DC

## 2015-06-17 MED ORDER — METFORMIN HCL 500 MG PO TABS: 500.0000 mg | ORAL_TABLET | Freq: Every evening | ORAL | Status: DC

## 2015-06-17 MED ORDER — LEVOTHYROXINE SODIUM 150 MCG PO TABS: 150.0000 ug | ORAL_TABLET | Freq: Every day | ORAL | Status: DC

## 2015-06-17 NOTE — Progress Notes (Signed)
Subjective:    Patient ID: Morgan [name], female    DOB: 11-02-1966, 49 y.o.   MRN: [id number]  HPI 49 year old female who is here to follow-up diabetes, hypertension, and hyperlipidemia, she also has ongoing problems with neck pain for which she sees orthopedics and migraine headache for which sees she sees neurology. Regarding diabetes when she checks her sugars at home have generally been less than 150 except she has been on a course of prednisone and sugars were higher. Her last A1c 6 months ago was 6.5. Her weight is down about 7 pounds. In reviewing her medicines noticed that she is on an anti-inflammatory and we discussed this might not be best regarding kidneys in view of diabetes.  Patient Active Problem List   Diagnosis Date Noted  . Intractable chronic migraine without aura 06/04/2015  . Pancreatitis 02/27/2014  . Fatty liver disease, nonalcoholic 74/07/1447  . Bipolar disorder 02/20/2014  . Metabolic syndrome 18/56/3149  . Vitamin D deficiency   . Morbid obesity 03/12/2013  . DM (diabetes mellitus) 03/12/2013  . HLD (hyperlipidemia) 03/12/2013  . Preoperative evaluation to rule out surgical contraindication 06/12/2011  . Unspecified hypothyroidism   . Constipation, chronic   . Vitamin B 12 deficiency   . ALLERGIC RHINITIS 12/31/2010  . BARRETTS ESOPHAGUS 12/31/2010  . Gastroparesis 12/31/2010  . OSTEOARTHRITIS 12/31/2010  . OBSTRUCTIVE SLEEP APNEA 09/27/2010  . Migraine 09/27/2010  . HYPERTENSION 09/27/2010  . RESPIRATORY FAILURE, CHRONIC 09/27/2010   Outpatient Encounter Prescriptions as of 06/17/2015  Medication Sig  . chlorproMAZINE (THORAZINE) 50 MG tablet Take 1 tablet (50 mg total) by mouth 2 (two) times daily.  . Cholecalciferol (VITAMIN D) 2000 UNITS CAPS Take 2,000 Units by mouth daily.  Marland Kitchen etodolac (LODINE) 500 MG tablet Take 1 tablet (500 mg total) by mouth 2 (two) times daily.  Marland Kitchen ezetimibe (ZETIA) 10 MG tablet Take 1 tablet (10 mg total) by mouth daily.    . fenofibrate micronized (LOFIBRA) 134 MG capsule Take 1 capsule (134 mg total) by mouth daily before breakfast.  . ferrous gluconate (FERGON) 325 MG tablet Take 325 mg by mouth daily with breakfast.    . fluticasone (FLONASE) 50 MCG/ACT nasal spray SPRAY 1 SPRAY IN EACH NOSTRIL TWICE DAILY. SHAKE GENTLY BEFORE EACH USE.  . furosemide (LASIX) 20 MG tablet TAKE 1 TABLET DAILY  . glucose blood (ONE TOUCH ULTRA TEST) test strip Test 2 times daily  . levothyroxine (SYNTHROID, LEVOTHROID) 150 MCG tablet TAKE 1 TABLET DAILY  . LINZESS 145 MCG CAPS capsule TAKE (1) CAPSULE DAILY  . loratadine (CLARITIN) 10 MG tablet Take 10 mg by mouth daily as needed. Prn   . metFORMIN (GLUCOPHAGE) 500 MG tablet Take 1 tablet (500 mg total) by mouth every evening.  . Multiple Vitamin (MULTIVITAMIN) capsule Take 1 capsule by mouth daily.    Marland Kitchen nystatin (MYCOSTATIN) powder Apply topically 2 (two) times daily as needed.    Marland Kitchen omeprazole (PRILOSEC) 40 MG capsule TAKE (1) CAPSULE DAILY  . potassium chloride (K-DUR) 10 MEQ tablet TAKE 1 TABLET DAILY  . Probiotic Product (HEALTHY COLON PO) Take 2 capsules by mouth daily.   . propranolol (INDERAL) 60 MG tablet Take 1 tablet (60 mg total) by mouth 2 (two) times daily.  . sertraline (ZOLOFT) 100 MG tablet TAKE (2) TABLETS DAILY  . traMADol (ULTRAM) 50 MG tablet Take 50 mg by mouth 2 (two) times daily.   . valsartan-hydrochlorothiazide (DIOVAN-HCT) 160-25 MG per tablet TAKE 1 TABLET DAILY  . ziprasidone (  GEODON) 60 MG capsule TAKE 1 CAPSULE IN THE EVENING  . ZOLMitriptan (ZOMIG) 2.5 MG SOLN Place 1 spray into the nose 2 (two) times daily as needed.  . [DISCONTINUED] predniSONE (DELTASONE) 10 MG tablet Take by mouth. Follow taper instructions  . [DISCONTINUED] ziprasidone (GEODON) 60 MG capsule TAKE  (1)  CAPSULE  daily   Facility-Administered Encounter Medications as of 06/17/2015  Medication  . cyanocobalamin ((VITAMIN B-12)) injection 1,000 mcg       Review of Systems   Constitutional: Negative.   Eyes: Negative.   Respiratory: Negative.   Cardiovascular: Negative.   Gastrointestinal: Negative.   Musculoskeletal: Positive for arthralgias and neck pain.  Neurological: Positive for headaches.       Objective:   Physical Exam  Constitutional: She is oriented to person, place, and time. She appears well-developed and well-nourished.  Cardiovascular: Normal rate, regular rhythm and intact distal pulses.   Pulmonary/Chest: Effort normal.  Neurological: She is alert and oriented to person, place, and time.  Psychiatric: She has a normal mood and affect.    BP 119/71 mmHg  Pulse 53  Temp(Src) 97.3 F (36.3 C) (Oral)  Ht '5\' 6"'  (1.676 m)  Wt 317 lb (143.79 kg)  BMI 51.19 kg/m2  LMP 05/22/2015       Assessment & Plan:  1. Hypothyroidism, unspecified hypothyroidism type She is having some irregular periods so we need to check TSH to be sure that dose of supplemental thyroid is appropriate - TSH  2. Essential hypertension Esters are well controlled on Diovan - CMP14+EGFR  3. HLD (hyperlipidemia) Intolerant of statins. She does take Zetia and fenofibrate. - Lipid panel  4. Type 2 diabetes mellitus without complication Sugars have been doing good when she checks at home. Last A1c was 6.5 on metformin only - POCT glycosylated hemoglobin (Hb A1C)  Wardell Honour MD

## 2015-06-17 NOTE — Patient Instructions (Signed)

## 2015-06-18 ENCOUNTER — Telehealth: Payer: Self-pay | Admitting: *Deleted

## 2015-06-18 LAB — CMP14+EGFR
A/G RATIO: 1.7 (ref 1.1–2.5)
ALBUMIN: 4 g/dL (ref 3.5–5.5)
ALT: 34 IU/L — ABNORMAL HIGH (ref 0–32)
AST: 24 IU/L (ref 0–40)
Alkaline Phosphatase: 58 IU/L (ref 39–117)
BILIRUBIN TOTAL: 0.3 mg/dL (ref 0.0–1.2)
BUN/Creatinine Ratio: 24 — ABNORMAL HIGH (ref 9–23)
BUN: 19 mg/dL (ref 6–24)
CALCIUM: 9.2 mg/dL (ref 8.7–10.2)
CO2: 26 mmol/L (ref 18–29)
CREATININE: 0.78 mg/dL (ref 0.57–1.00)
Chloride: 101 mmol/L (ref 97–108)
GFR calc non Af Amer: 91 mL/min/{1.73_m2} (ref >59)
GFR, EST AFRICAN AMERICAN: 105 mL/min/{1.73_m2} (ref >59)
Globulin, Total: 2.3 g/dL (ref 1.5–4.5)
Glucose: 131 mg/dL — ABNORMAL HIGH (ref 65–99)
Potassium: 4.1 mmol/L (ref 3.5–5.2)
Sodium: 142 mmol/L (ref 134–144)
TOTAL PROTEIN: 6.3 g/dL (ref 6.0–8.5)

## 2015-06-18 LAB — LIPID PANEL
Chol/HDL Ratio: 6 ratio units — ABNORMAL HIGH (ref 0.0–4.4)
Cholesterol, Total: 199 mg/dL (ref 100–199)
HDL: 33 mg/dL — ABNORMAL LOW (ref >39)
Triglycerides: 425 mg/dL — ABNORMAL HIGH (ref 0–149)

## 2015-06-18 LAB — TSH: TSH: 4.43 u[IU]/mL (ref 0.450–4.500)

## 2015-06-18 NOTE — Telephone Encounter (Signed)
Pt notified of results Verbalizes understanding 

## 2015-06-18 NOTE — Telephone Encounter (Signed)
-----   Message from Wardell Honour, MD sent at 06/18/2015  7:48 AM EDT ----- Hemoglobin A1c is near goal lipids show elevated triglycerides despite therapy. Needs better adherence to low-fat diet and be sure she is taking her fiber 8 regularly

## 2015-06-19 DIAGNOSIS — N939 Abnormal uterine and vaginal bleeding, unspecified: Secondary | ICD-10-CM | POA: Diagnosis not present

## 2015-06-22 ENCOUNTER — Telehealth: Payer: Self-pay | Admitting: Neurology

## 2015-06-22 NOTE — Telephone Encounter (Signed)
I called the patient to let her know that we received her message and that I was forwarding it to the Botox coordinator to check on the status of her request.

## 2015-06-22 NOTE — Telephone Encounter (Signed)
Patient called and wanted to follow up on the request to receive botox, she states that Dr. Jannifer Franklin sent over paperwork to her insurance regarding the botox, and he informed her that if she had not heard anything back within two weeks she should call us back and follow up. Please call and advise.

## 2015-06-29 DIAGNOSIS — R0902 Hypoxemia: Secondary | ICD-10-CM | POA: Diagnosis not present

## 2015-06-29 DIAGNOSIS — G4733 Obstructive sleep apnea (adult) (pediatric): Secondary | ICD-10-CM | POA: Diagnosis not present

## 2015-07-06 NOTE — Telephone Encounter (Signed)
Please call patient to schedule a Botox appointment. Thanks!

## 2015-07-07 NOTE — Telephone Encounter (Signed)
Patient returned your call. Please call [phone].  Thanks!

## 2015-07-07 NOTE — Telephone Encounter (Signed)
Called patient. No answer. Left vmail to return call.  

## 2015-07-08 NOTE — Telephone Encounter (Signed)
Spoke to patient. Explained because of insurance Botox injections will be performed by [facility]. Patient verbalized understanding.

## 2015-07-09 ENCOUNTER — Encounter: Payer: Medicare Other | Admitting: Nurse Practitioner

## 2015-07-09 ENCOUNTER — Encounter: Payer: Self-pay | Admitting: Nurse Practitioner

## 2015-07-09 DIAGNOSIS — E538 Deficiency of other specified B group vitamins: Secondary | ICD-10-CM

## 2015-07-09 DIAGNOSIS — Z23 Encounter for immunization: Secondary | ICD-10-CM

## 2015-07-09 NOTE — Patient Instructions (Signed)

## 2015-07-09 NOTE — Progress Notes (Signed)
Patient administered B12 injection to right deltoid, tolerated well.  Patient administered to left deltoid Boostrix, tolerated well, VIS given

## 2015-07-13 ENCOUNTER — Ambulatory Visit: Payer: Medicare Other

## 2015-07-13 ENCOUNTER — Other Ambulatory Visit: Payer: Self-pay | Admitting: Family Medicine

## 2015-07-15 ENCOUNTER — Other Ambulatory Visit: Payer: Self-pay | Admitting: Family Medicine

## 2015-07-21 ENCOUNTER — Telehealth: Payer: Self-pay | Admitting: Neurology

## 2015-07-21 NOTE — Telephone Encounter (Signed)
Patient called stating she needs information on who will be calling her for botox. She is upset.Please call asap. Patient can be [phone].

## 2015-07-21 NOTE — Telephone Encounter (Signed)
Spoke with patient and scheduled her for her BOTOX injections 07/30/2015 at 12 pt verbalized understanding

## 2015-07-28 ENCOUNTER — Telehealth: Payer: Self-pay

## 2015-07-28 NOTE — Telephone Encounter (Signed)
Spoke to patient. Advised [facility] will contact her in reference to confirm shipment of Botox to our office. Went over Botox shipment plan. Patient verbalized understanding. Botox PA# [id number] exp. 01/28/2016

## 2015-07-30 ENCOUNTER — Ambulatory Visit (INDEPENDENT_AMBULATORY_CARE_PROVIDER_SITE_OTHER): Payer: Medicare Other | Admitting: Neurology

## 2015-07-30 ENCOUNTER — Ambulatory Visit: Payer: Medicare Other | Admitting: Pulmonary Disease

## 2015-07-30 ENCOUNTER — Encounter: Payer: Self-pay | Admitting: Neurology

## 2015-07-30 VITALS — BP 141/69 | HR 59

## 2015-07-30 DIAGNOSIS — R0902 Hypoxemia: Secondary | ICD-10-CM | POA: Diagnosis not present

## 2015-07-30 DIAGNOSIS — G4733 Obstructive sleep apnea (adult) (pediatric): Secondary | ICD-10-CM | POA: Diagnosis not present

## 2015-07-30 DIAGNOSIS — G43719 Chronic migraine without aura, intractable, without status migrainosus: Secondary | ICD-10-CM

## 2015-07-30 MED ORDER — ZOLMITRIPTAN 5 MG NA SOLN: 1.0000 | Freq: Two times a day (BID) | NASAL | Status: DC | PRN

## 2015-07-30 NOTE — Procedures (Signed)
     BOTOX PROCEDURE NOTE FOR MIGRAINE HEADACHE   HISTORY: Morgan [name] is a 49 year old patient with a history of intractable migraine headaches. She is having at least 15 headache days a month, she is on propranolol which has helped the duration of the headaches, but she still has very frequent headaches. At least 6 days a month she has to lie down and cannot function with the headache. She has been on several different prophylactic medications without benefit. Propranolol has worked the best for her. She takes Zomig nasal spray with some benefit. She comes in today for Botox injections for treatment of intractable migraine headache.   Description of procedure:  The patient was placed in a sitting position. The standard protocol was used for Botox as follows, with 5 units of Botox injected at each site:   -Procerus muscle, midline injection  -Corrugator muscle, bilateral injection  -Frontalis muscle, bilateral injection, with 2 sites each side, medial injection was performed in the upper one third of the frontalis muscle, in the region vertical from the medial inferior edge of the superior orbital rim. The lateral injection was again in the upper one third of the forehead vertically above the lateral limbus of the cornea, 1.5 cm lateral to the medial injection site.  -Temporalis muscle injection, 4 sites, bilaterally. The first injection was 3 cm above the tragus of the ear, second injection site was 1.5 cm to 3 cm up from the first injection site in line with the tragus of the ear. The third injection site was 1.5-3 cm forward between the first 2 injection sites. The fourth injection site was 1.5 cm posterior to the second injection site.  -Occipitalis muscle injection, 3 sites, bilaterally. The first injection was done one half way between the occipital protuberance and the tip of the mastoid process behind the ear. The second injection site was done lateral and superior to the first, 1  fingerbreadth from the first injection. The third injection site was 1 fingerbreadth superiorly and medially from the first injection site.  -Cervical paraspinal muscle injection, 2 sites, bilateral, the first injection site was 1 cm from the midline of the cervical spine, 3 cm inferior to the lower border of the occipital protuberance. The second injection site was 1.5 cm superiorly and laterally to the first injection site.  -Trapezius muscle injection was performed at 3 sites, bilaterally. The first injection site was in the upper trapezius muscle halfway between the inflection point of the neck, and the acromion. The second injection site was one half way between the acromion and the first injection site. The third injection was done between the first injection site and the inflection point of the neck.   A 200 unit bottle of Botox was used, 155 units were injected, the rest of the Botox was wasted. The patient tolerated the procedure well, there were no complications of the above procedure.  Botox NDC 4536-4680-32 Lot number [id number] Expiration date March 2019

## 2015-08-03 ENCOUNTER — Other Ambulatory Visit: Payer: Self-pay

## 2015-08-03 DIAGNOSIS — N939 Abnormal uterine and vaginal bleeding, unspecified: Secondary | ICD-10-CM | POA: Diagnosis not present

## 2015-08-03 DIAGNOSIS — Z3202 Encounter for pregnancy test, result negative: Secondary | ICD-10-CM | POA: Diagnosis not present

## 2015-08-10 ENCOUNTER — Ambulatory Visit (INDEPENDENT_AMBULATORY_CARE_PROVIDER_SITE_OTHER): Payer: Medicare Other | Admitting: *Deleted

## 2015-08-10 DIAGNOSIS — E538 Deficiency of other specified B group vitamins: Secondary | ICD-10-CM | POA: Diagnosis not present

## 2015-08-10 NOTE — Progress Notes (Signed)
Vitamin b12 injection given and tolerated well.  

## 2015-08-10 NOTE — Patient Instructions (Signed)

## 2015-08-30 DIAGNOSIS — G4733 Obstructive sleep apnea (adult) (pediatric): Secondary | ICD-10-CM | POA: Diagnosis not present

## 2015-08-30 DIAGNOSIS — R0902 Hypoxemia: Secondary | ICD-10-CM | POA: Diagnosis not present

## 2015-09-02 ENCOUNTER — Telehealth: Payer: Self-pay | Admitting: Neurology

## 2015-09-02 MED ORDER — RIZATRIPTAN BENZOATE 10 MG PO TBDP: 10.0000 mg | ORAL_TABLET | Freq: Three times a day (TID) | ORAL | Status: DC | PRN

## 2015-09-02 NOTE — Telephone Encounter (Signed)
Patient called in regards to zolmitriptan (ZOMIG) 5 MG nasal solution . She is experiencing severe stomach pain after using medication. She has been using medication for 2 months but thought the stomach issues and nausea were from irritable bowel syndrome. She read information on medication and discovered stomach issus could be associated so she has stopped medication x 1 week with significant decrease in stomach cramping and nausea. She is not going to take medication but is requesting what to do next. Please call and advise. Patient can be reached at [phone] and [phone] (cell).

## 2015-09-02 NOTE — Telephone Encounter (Signed)
I called patient. She indicates that the Zomig nasal spray upsets her stomach. Imitrex previously did not help much, but it did not upset her stomach. I may try an orally disintegrating Maxalt tablet to see if this helps.

## 2015-09-09 ENCOUNTER — Other Ambulatory Visit: Payer: Self-pay | Admitting: Family Medicine

## 2015-09-11 ENCOUNTER — Ambulatory Visit (INDEPENDENT_AMBULATORY_CARE_PROVIDER_SITE_OTHER): Payer: Medicare Other | Admitting: *Deleted

## 2015-09-11 DIAGNOSIS — G4733 Obstructive sleep apnea (adult) (pediatric): Secondary | ICD-10-CM | POA: Diagnosis not present

## 2015-09-11 DIAGNOSIS — E538 Deficiency of other specified B group vitamins: Secondary | ICD-10-CM | POA: Diagnosis not present

## 2015-09-23 ENCOUNTER — Other Ambulatory Visit: Payer: Self-pay | Admitting: Neurology

## 2015-09-29 DIAGNOSIS — G4733 Obstructive sleep apnea (adult) (pediatric): Secondary | ICD-10-CM | POA: Diagnosis not present

## 2015-09-29 DIAGNOSIS — R0902 Hypoxemia: Secondary | ICD-10-CM | POA: Diagnosis not present

## 2015-10-01 DIAGNOSIS — M170 Bilateral primary osteoarthritis of knee: Secondary | ICD-10-CM | POA: Diagnosis not present

## 2015-10-12 ENCOUNTER — Ambulatory Visit: Payer: Medicare Other

## 2015-10-13 ENCOUNTER — Ambulatory Visit (INDEPENDENT_AMBULATORY_CARE_PROVIDER_SITE_OTHER): Payer: Medicare Other | Admitting: Internal Medicine

## 2015-10-13 ENCOUNTER — Encounter: Payer: Self-pay | Admitting: Internal Medicine

## 2015-10-13 VITALS — BP 124/72 | HR 64 | Ht 67.0 in | Wt 313.6 lb

## 2015-10-13 DIAGNOSIS — G4733 Obstructive sleep apnea (adult) (pediatric): Secondary | ICD-10-CM | POA: Diagnosis not present

## 2015-10-13 DIAGNOSIS — J9611 Chronic respiratory failure with hypoxia: Secondary | ICD-10-CM

## 2015-10-13 NOTE — Assessment & Plan Note (Signed)
Severe obstructive sleep apnea complicated by obesity. She describes good compliance and control with CPAP at 18/[facility] supplemented with oxygen bleed in at 2 L

## 2015-10-13 NOTE — Assessment & Plan Note (Signed)
Offered consideration of bariatric referral

## 2015-10-13 NOTE — Patient Instructions (Signed)
Order- [facility]- Continue CPAP 18 with O2 2L bleed in, mask of choice, humidifier, supplies,                     Please add AirView, download for pressure compliance    Dx OSA, morbid obesity

## 2015-10-13 NOTE — Assessment & Plan Note (Signed)
Require supplemental oxygen at 2 L during sleep, through her CPAP machine. This probably reflects her morbid obesity with obesity hypoventilation syndrome

## 2015-10-13 NOTE — Progress Notes (Signed)
07/30/14- Dr Gwenette Greet The patient comes in today for followup of her obstructive sleep apnea. She is wearing CPAP compliantly, and is having no issues with her mask fit or pressure. She feels that she is sleeping well with the device, and is satisfied with her sleep and daytime alertness. Of note, her weight is up 12 pounds since last visit.   10/13/15- 84 yoF following for obstructive sleep apnea complicated by obesity, HBP, GERD/Barrett's, chronic respiratory failure with hypoxia, bipolar CPAP 18/ [facility] w O2 2L NPSG 09/05/10  AHI 50.9 per hour, CPAP to 18, body weight 364 pounds  Former [facility] pt-Wears CPAP every night for about 6 hours; DME is [facility]-no recent download; has had current CPAP machine for about 2 years.      She describes good compliance and control. CPAP stops her snoring and she sleeps very well. Declines flu vaccine-discussed ENT surgery history-tonsils and adenoids  ROS-see HPI   Negative unless "+" Constitutional:    weight loss, night sweats, fevers, chills, fatigue, lassitude. HEENT:    headaches, difficulty swallowing, tooth/dental problems, sore throat,       sneezing, itching, ear ache, nasal congestion, post nasal drip, snoring CV:    chest pain, orthopnea, PND, swelling in lower extremities, anasarca,                                                   dizziness, palpitations Resp:   shortness of breath with exertion or at rest.                productive cough,   non-productive cough, coughing up of blood.              change in color of mucus.  wheezing.   Skin:    rash or lesions. GI:  No-   heartburn, indigestion, abdominal pain, nausea, vomiting,  GU:  MS:   joint pain, stiffness, decreased range of motion, back pain. Neuro-     nothing unusual Psych:  change in mood or affect.  depression or anxiety.   memory loss.  OBJ- Physical Exam General- Alert, Oriented, Affect-appropriate, Distress- none acute, + morbid obesity Skin- rash-none,  lesions- none, excoriation- none Lymphadenopathy- none Head- atraumatic            Eyes- Gross vision intact, PERRLA, conjunctivae and secretions clear            Ears- Hearing, canals-normal            Nose- Clear, no-Septal dev, mucus, polyps, erosion, perforation             Throat- Mallampati IV , mucosa clear , drainage- none, tonsils- atrophic Neck- flexible , trachea midline, no stridor , thyroid nl, carotid no bruit Chest - symmetrical excursion , unlabored           Heart/CV- RRR , no murmur , no gallop  , no rub, nl s1 s2                           - JVD- none , edema- none, stasis changes- none, varices- none           Lung- clear to P&A, wheeze- none, cough- none , dullness-none, rub- none           Chest wall-  Abd-  Br/ Gen/ Rectal- Not done, not indicated Extrem- cyanosis- none, clubbing, none, atrophy- none, strength- nl Neuro- grossly intact to observation

## 2015-10-14 ENCOUNTER — Ambulatory Visit (INDEPENDENT_AMBULATORY_CARE_PROVIDER_SITE_OTHER): Payer: Medicare Other | Admitting: Neurology

## 2015-10-14 ENCOUNTER — Encounter: Payer: Self-pay | Admitting: Neurology

## 2015-10-14 DIAGNOSIS — G43719 Chronic migraine without aura, intractable, without status migrainosus: Secondary | ICD-10-CM | POA: Diagnosis not present

## 2015-10-14 NOTE — Progress Notes (Signed)
Reason for visit: Migraine headache  Morgan [name] is an 49 y.o. female  History of present illness:  Morgan [name] is a 49 year old right-handed white female with a history of morbid obesity and intractable migraine headache. The patient was having greater than 15 headache days a month, she was set up for Botox injections, and she received her first injection on 18th of August 2016. The patient indicates that she had a slight improvement in her headaches for several weeks afterwards, but otherwise she gained no benefit from the injection. The patient had only 12 headache days in October. She has been under some increased stress recently as her sister had a significant stroke event 2 weeks ago with a left hemiparesis. The patient indicates that she has been sleeping fairly well otherwise. She does have some nausea with her headaches, she has been taking propranolol 60 mg twice a day, without any significant side effects. The patient takes Maxalt for the headache with some benefit. At times, her headaches may last 2 days. She comes to this office for an evaluation. In October, 7 of the 12 headaches were incapacitating requiring that she lie down, unable to function.  Past Medical History  Diagnosis Date  . Unspecified hypothyroidism   . Constipation, chronic   . Vitamin B 12 deficiency   . Barrett's esophagus   . Osteoarthritis     bilateral knee  . Gastroparesis   . Allergic rhinitis   . Chronic respiratory failure ([place])   . Migraine headache   . Hypertension   . Diabetes mellitus without complication ([place])   . GERD (gastroesophageal reflux disease)   . OSA (obstructive sleep apnea)     uses a cpap  . Sleep apnea     has c-pap  . Vitamin D deficiency   . Bipolar affective disorder ([place])   . Hyperlipidemia   . Morbid obesity ([place])   . Intractable chronic migraine without aura 06/04/2015    Past Surgical History  Procedure Laterality Date  . Cholecystectomy  2005  .  Appendectomy  1978  . Tonsillectomy  at age 37  . Shoulder arthroscopy  7/12    left-dsc  . Trigger finger release  12/20/2012    Procedure: RELEASE TRIGGER FINGER/A-1 PULLEY;  Surgeon: Tennis Must, MD;  Location: [facility];  Service: Orthopedics;  Laterality: Left;  LEFT TRIGGER THUMB RELEASE    Family History  Problem Relation Age of Onset  . Asthma Father   . Allergies Father   . Heart disease Father     enlarged heart  . Peripheral vascular disease Father   . Diabetes Father   . Hyperlipidemia Father   . Arthritis Father   . Asthma Sister   . Cancer Sister     colon at 29 yr old.  . Colon cancer Sister   . Allergies Mother   . Stroke Mother 75    with hemi paralysis  . Diabetes Mother   . Hyperlipidemia Mother   . Hypertension Mother   . GI problems Mother   . Arthritis Mother   . Allergies Brother   . Early death Brother 9    congenital abormality  . Allergies Sister   . Diabetes Sister   . Asthma Sister   . Colon polyps Sister   . Hyperlipidemia Sister   . GI problems Sister     gastroporesis   . Liver disease Sister     fatty liver  . Diabetes Daughter   .  Hyperlipidemia Daughter   . Hypertension Daughter   . Migraines Neg Hx     Social history:  reports that she has never smoked. She has never used smokeless tobacco. She reports that she does not drink alcohol or use illicit drugs.    Allergies  Allergen Reactions  . Codeine     REACTION: increased BP  . Ketoconazole     REACTION: hives, swelling  . Pravachol [Pravastatin Sodium] Swelling    Throat swelling  . Statins     REACTION: leg crapms    Medications:  Prior to Admission medications   Medication Sig Start Date End Date Taking? Authorizing Provider  chlorproMAZINE (THORAZINE) 50 MG tablet Take 1 tablet (50 mg total) by mouth 2 (two) times daily. 04/13/15  Yes Kathrynn Ducking, MD  Cholecalciferol (VITAMIN D) 2000 UNITS CAPS Take 2,000 Units by mouth daily.   Yes Historical  Provider, MD  diclofenac sodium (VOLTAREN) 1 % GEL Use as directed for knee pain 10/02/15  Yes Historical Provider, MD  ezetimibe (ZETIA) 10 MG tablet Take 1 tablet (10 mg total) by mouth daily. 06/17/15  Yes Wardell Honour, MD  fenofibrate micronized (LOFIBRA) 134 MG capsule TAKE (1) CAPSULE DAILY BEFORE BREAKFAST. 07/13/15  Yes Wardell Honour, MD  ferrous gluconate (FERGON) 325 MG tablet Take 325 mg by mouth daily with breakfast.     Yes Historical Provider, MD  fluticasone (FLONASE) 50 MCG/ACT nasal spray SPRAY 1 SPRAY IN EACH NOSTRIL TWICE DAILY. SHAKE GENTLY BEFORE EACH USE. 12/10/14  Yes Wardell Honour, MD  furosemide (LASIX) 20 MG tablet TAKE 1 TABLET DAILY 09/09/15  Yes Wardell Honour, MD  levothyroxine (SYNTHROID, LEVOTHROID) 150 MCG tablet Take 1 tablet (150 mcg total) by mouth daily. 06/17/15  Yes Wardell Honour, MD  LINZESS 145 MCG CAPS capsule TAKE (1) CAPSULE DAILY 09/09/15  Yes Wardell Honour, MD  loratadine (CLARITIN) 10 MG tablet Take 10 mg by mouth daily as needed. Prn    Yes Historical Provider, MD  metFORMIN (GLUCOPHAGE) 500 MG tablet Take 1 tablet (500 mg total) by mouth every evening. 06/17/15  Yes Wardell Honour, MD  Multiple Vitamin (MULTIVITAMIN) capsule Take 1 capsule by mouth daily.     Yes Historical Provider, MD  nystatin (MYCOSTATIN) powder Apply topically 2 (two) times daily as needed.     Yes Historical Provider, MD  omeprazole (PRILOSEC) 40 MG capsule TAKE (1) CAPSULE DAILY 09/09/15  Yes Wardell Honour, MD  ONE TOUCH ULTRA TEST test strip CHECK BLOOD SUGAR TWICE A DAY AS DIRECTED 07/16/15  Yes Wardell Honour, MD  potassium chloride (K-DUR) 10 MEQ tablet TAKE 1 TABLET DAILY 06/09/15  Yes Chipper Herb, MD  Probiotic Product (HEALTHY COLON PO) Take 2 capsules by mouth daily.    Yes Historical Provider, MD  propranolol (INDERAL) 60 MG tablet TAKE  (1)  TABLET TWICE A DAY. 09/23/15  Yes Kathrynn Ducking, MD  rizatriptan (MAXALT-MLT) 10 MG disintegrating tablet  Take 1 tablet (10 mg total) by mouth 3 (three) times daily as needed for migraine. 09/02/15  Yes Kathrynn Ducking, MD  sertraline (ZOLOFT) 100 MG tablet Take 2 tablets (200 mg total) by mouth daily. 06/17/15  Yes Wardell Honour, MD  traMADol (ULTRAM) 50 MG tablet Take 50 mg by mouth 2 (two) times daily.  02/27/13  Yes Historical Provider, MD  valsartan-hydrochlorothiazide (DIOVAN-HCT) 160-25 MG per tablet TAKE 1 TABLET DAILY 06/09/15  Yes Chipper Herb, MD  ziprasidone (GEODON) 60 MG capsule TAKE 1 CAPSULE IN THE EVENING 06/17/15  Yes Wardell Honour, MD    ROS:  Out of a complete 14 system review of symptoms, the patient complains only of the following symptoms, and all other reviewed systems are negative.  Constipation Sleep apnea Environmental allergies Joint pain, neck pain Anemia Headache Depression  Blood pressure 135/76, pulse 53, height 5\' 7"  (1.702 m), weight 311 lb (141.069 kg).  Physical Exam  General: The patient is alert and cooperative at the time of the examination. The patient is morbidly obese.  Skin: No significant peripheral edema is noted.   Neurologic Exam  Mental status: The patient is alert and oriented x 3 at the time of the examination. The patient has apparent normal recent and remote memory, with an apparently normal attention span and concentration ability.   Cranial nerves: Facial symmetry is present. Speech is normal, no aphasia or dysarthria is noted. Extraocular movements are full. Visual fields are full.  Motor: The patient has good strength in all 4 extremities.  Sensory examination: Soft touch sensation is symmetric on the face, arms, and legs.  Coordination: The patient has good finger-nose-finger and heel-to-shin bilaterally.  Gait and station: The patient has a normal gait. Tandem gait is normal. Romberg is negative. No drift is seen.  Reflexes: Deep tendon reflexes are symmetric.   Assessment/Plan:  1. Intractable migraine  headache  2. Morbid obesity  The patient will continue on propranolol, the heart rate today is 53, we may be unable to increase the dose. The patient has gained a modest benefit with the first Botox injection, we will give a least 3 or 4 treatments before we make a decision to continue or stop the treatment. The patient will follow-up for her next injection within the next several weeks.  Jill Alexanders MD 10/14/2015 8:17 AM  [facility] [address] [place], [place] [address]  Phone [phone] Fax [phone]

## 2015-10-14 NOTE — Patient Instructions (Signed)

## 2015-10-15 ENCOUNTER — Ambulatory Visit (INDEPENDENT_AMBULATORY_CARE_PROVIDER_SITE_OTHER): Payer: Medicare Other | Admitting: *Deleted

## 2015-10-15 DIAGNOSIS — E538 Deficiency of other specified B group vitamins: Secondary | ICD-10-CM

## 2015-10-15 MED ORDER — CYANOCOBALAMIN 1000 MCG/ML IJ SOLN
1000.0000 ug | INTRAMUSCULAR | Status: AC
  Administered 2015-10-15 – 2016-09-29 (×12): 1000 ug via INTRAMUSCULAR

## 2015-10-15 NOTE — Patient Instructions (Signed)

## 2015-10-15 NOTE — Progress Notes (Signed)
Patient is here today for a vitamin b12 injection and tolerated well. Patients bp was rechecked. Patients BP 115/71 mmHg  Pulse 56

## 2015-10-22 ENCOUNTER — Telehealth: Payer: Self-pay | Admitting: Neurology

## 2015-10-22 NOTE — Telephone Encounter (Signed)
Called patient to schedule botox inj. Left a VM asking her to call me back.

## 2015-10-30 ENCOUNTER — Ambulatory Visit (HOSPITAL_BASED_OUTPATIENT_CLINIC_OR_DEPARTMENT_OTHER): Payer: Medicare Other | Admitting: Physical Medicine & Rehabilitation

## 2015-10-30 ENCOUNTER — Encounter: Payer: Self-pay | Admitting: Physical Medicine & Rehabilitation

## 2015-10-30 ENCOUNTER — Encounter: Payer: Medicare Other | Attending: Physical Medicine & Rehabilitation

## 2015-10-30 VITALS — BP 128/72 | HR 54

## 2015-10-30 DIAGNOSIS — G43719 Chronic migraine without aura, intractable, without status migrainosus: Secondary | ICD-10-CM | POA: Insufficient documentation

## 2015-10-30 DIAGNOSIS — G4733 Obstructive sleep apnea (adult) (pediatric): Secondary | ICD-10-CM | POA: Diagnosis not present

## 2015-10-30 DIAGNOSIS — R0902 Hypoxemia: Secondary | ICD-10-CM | POA: Diagnosis not present

## 2015-10-30 NOTE — Patient Instructions (Signed)
You received a botulinum toxin injection for chronic headache prevention. You may have soreness in your injection site, you may use ice for 20-30 minutes every 2 hours to help with soreness.  Maximum effect of the injection will be at around 1 week. Other side effects include possible muscle weakness which will subside as the medication wears off.  Medication may be repeated every 3 months as needed. 

## 2015-11-09 ENCOUNTER — Other Ambulatory Visit: Payer: Self-pay | Admitting: Family Medicine

## 2015-11-15 ENCOUNTER — Other Ambulatory Visit: Payer: Self-pay

## 2015-11-15 MED ORDER — RIZATRIPTAN BENZOATE 10 MG PO TBDP: 10.0000 mg | ORAL_TABLET | Freq: Three times a day (TID) | ORAL | Status: DC | PRN

## 2015-11-16 ENCOUNTER — Telehealth: Payer: Self-pay | Admitting: Neurology

## 2015-11-16 ENCOUNTER — Ambulatory Visit (INDEPENDENT_AMBULATORY_CARE_PROVIDER_SITE_OTHER): Payer: Medicare Other | Admitting: *Deleted

## 2015-11-16 DIAGNOSIS — E538 Deficiency of other specified B group vitamins: Secondary | ICD-10-CM | POA: Diagnosis not present

## 2015-11-16 MED ORDER — TIZANIDINE HCL 2 MG PO TABS
ORAL_TABLET | ORAL | Status: DC
Start: 2015-11-16 — End: 2016-05-10

## 2015-11-16 NOTE — Progress Notes (Signed)
Patient given vitamin b12 injection right deltoid and tolerated well.

## 2015-11-16 NOTE — Telephone Encounter (Signed)
Pt called and states that since she had her botox injection (10/30/15) she has had a headache and her head feels like she has someone pressing in on her at all time. " like a vise grip on both sides and on the back of my head." she also says that her head feels like it is pounding. Please call and advise [phone]

## 2015-11-16 NOTE — Telephone Encounter (Signed)
I called the patient. The patient has had Botox injections in November, has a squeezing sensation in the head at this time. Likely represents some component of a tension headache. I will try tizanidine for this.

## 2015-11-16 NOTE — Patient Instructions (Signed)

## 2015-11-16 NOTE — Telephone Encounter (Signed)
I called the patient. She states that she has had a constant headache since her last Botox injections (10/30/15). It feels like someone is squeezing her head in the sides and back. Maxalt helps some but she still has to lay down all the time and it hurts to touch it. She wants to make sure she is not having an allergic reaction to the Botox and would like to know if there is anything else she can try to help with the migraine. I advised that I would forward this message to Dr. Jannifer Franklin and we would call her back.

## 2015-11-29 DIAGNOSIS — G4733 Obstructive sleep apnea (adult) (pediatric): Secondary | ICD-10-CM | POA: Diagnosis not present

## 2015-11-29 DIAGNOSIS — R0902 Hypoxemia: Secondary | ICD-10-CM | POA: Diagnosis not present

## 2015-12-09 ENCOUNTER — Other Ambulatory Visit: Payer: Self-pay | Admitting: Family Medicine

## 2015-12-18 ENCOUNTER — Ambulatory Visit (INDEPENDENT_AMBULATORY_CARE_PROVIDER_SITE_OTHER): Payer: Medicare Other

## 2015-12-18 DIAGNOSIS — E538 Deficiency of other specified B group vitamins: Secondary | ICD-10-CM

## 2015-12-22 ENCOUNTER — Ambulatory Visit: Payer: Medicare Other | Admitting: Family Medicine

## 2015-12-22 ENCOUNTER — Other Ambulatory Visit: Payer: Self-pay | Admitting: Family Medicine

## 2015-12-22 NOTE — Telephone Encounter (Signed)
Last seen 06/17/15  Dr Sabra Heck

## 2015-12-30 DIAGNOSIS — G4733 Obstructive sleep apnea (adult) (pediatric): Secondary | ICD-10-CM | POA: Diagnosis not present

## 2015-12-30 DIAGNOSIS — R0902 Hypoxemia: Secondary | ICD-10-CM | POA: Diagnosis not present

## 2016-01-04 ENCOUNTER — Encounter: Payer: Self-pay | Admitting: Family Medicine

## 2016-01-04 ENCOUNTER — Ambulatory Visit (INDEPENDENT_AMBULATORY_CARE_PROVIDER_SITE_OTHER): Payer: Medicare Other | Admitting: Family Medicine

## 2016-01-04 VITALS — BP 136/81 | HR 62 | Temp 97.1°F | Ht 67.0 in | Wt 315.0 lb

## 2016-01-04 DIAGNOSIS — E538 Deficiency of other specified B group vitamins: Secondary | ICD-10-CM

## 2016-01-04 DIAGNOSIS — J0110 Acute frontal sinusitis, unspecified: Secondary | ICD-10-CM | POA: Diagnosis not present

## 2016-01-04 DIAGNOSIS — E119 Type 2 diabetes mellitus without complications: Secondary | ICD-10-CM | POA: Diagnosis not present

## 2016-01-04 DIAGNOSIS — I10 Essential (primary) hypertension: Secondary | ICD-10-CM

## 2016-01-04 DIAGNOSIS — E785 Hyperlipidemia, unspecified: Secondary | ICD-10-CM

## 2016-01-04 DIAGNOSIS — E039 Hypothyroidism, unspecified: Secondary | ICD-10-CM | POA: Diagnosis not present

## 2016-01-04 LAB — POCT GLYCOSYLATED HEMOGLOBIN (HGB A1C): Hemoglobin A1C: 7

## 2016-01-04 MED ORDER — LINACLOTIDE 145 MCG PO CAPS: ORAL_CAPSULE | ORAL | Status: DC

## 2016-01-04 MED ORDER — VALSARTAN-HYDROCHLOROTHIAZIDE 160-25 MG PO TABS: 1.0000 | ORAL_TABLET | Freq: Every day | ORAL | Status: DC

## 2016-01-04 MED ORDER — METFORMIN HCL 500 MG PO TABS: 500.0000 mg | ORAL_TABLET | Freq: Every evening | ORAL | Status: DC

## 2016-01-04 MED ORDER — AMOXICILLIN 875 MG PO TABS: 875.0000 mg | ORAL_TABLET | Freq: Two times a day (BID) | ORAL | Status: DC

## 2016-01-04 MED ORDER — SERTRALINE HCL 100 MG PO TABS: 200.0000 mg | ORAL_TABLET | Freq: Every day | ORAL | Status: DC

## 2016-01-04 MED ORDER — FUROSEMIDE 20 MG PO TABS: 20.0000 mg | ORAL_TABLET | Freq: Every day | ORAL | Status: DC

## 2016-01-04 MED ORDER — LEVOTHYROXINE SODIUM 150 MCG PO TABS: 150.0000 ug | ORAL_TABLET | Freq: Every day | ORAL | Status: DC

## 2016-01-04 MED ORDER — OMEPRAZOLE 40 MG PO CPDR: DELAYED_RELEASE_CAPSULE | ORAL | Status: DC

## 2016-01-04 MED ORDER — FENOFIBRATE MICRONIZED 134 MG PO CAPS: ORAL_CAPSULE | ORAL | Status: DC

## 2016-01-04 MED ORDER — ZIPRASIDONE HCL 60 MG PO CAPS: ORAL_CAPSULE | ORAL | Status: DC

## 2016-01-04 MED ORDER — POTASSIUM CHLORIDE ER 10 MEQ PO TBCR: 10.0000 meq | EXTENDED_RELEASE_TABLET | Freq: Every day | ORAL | Status: DC

## 2016-01-04 NOTE — Progress Notes (Signed)
Subjective:    Patient ID: Morgan [name], female    DOB: 1966/11/08, 50 y.o.   MRN: [id number]  HPI Pt here for follow up and management of chronic medical problems which includes hyperlipidemia, hypertension, diabetes and hypothyroid. She is taking medications regularly.  Today she is complaining of some sinus symptoms with pressure and drainage. She took Claritin for the drainage. The drainage stopped but headaches increased. This is not the same as her migraine headaches which she has frequently. She is receiving Botox injections for migraines.       Patient Active Problem List   Diagnosis Date Noted  . Intractable chronic migraine without aura 06/04/2015  . Pancreatitis 02/27/2014  . Fatty liver disease, nonalcoholic [id number]  . Bipolar disorder ([place]) 02/20/2014  . Metabolic syndrome [id number]  . Vitamin D deficiency   . Morbid obesity ([place]) 03/12/2013  . DM (diabetes mellitus) ([place]) 03/12/2013  . HLD (hyperlipidemia) 03/12/2013  . Preoperative evaluation to rule out surgical contraindication 06/12/2011  . Hypothyroidism   . Constipation, chronic   . Vitamin B 12 deficiency   . ALLERGIC RHINITIS 12/31/2010  . BARRETTS ESOPHAGUS 12/31/2010  . Gastroparesis 12/31/2010  . OSTEOARTHRITIS 12/31/2010  . Obstructive sleep apnea 09/27/2010  . Migraine 09/27/2010  . Essential hypertension 09/27/2010  . RESPIRATORY FAILURE, CHRONIC 09/27/2010   Outpatient Encounter Prescriptions as of 01/04/2016  Medication Sig  . Cholecalciferol (VITAMIN D) 2000 UNITS CAPS Take 2,000 Units by mouth daily.  . diclofenac sodium (VOLTAREN) 1 % GEL Use as directed for knee pain  . ezetimibe (ZETIA) 10 MG tablet Take 1 tablet (10 mg total) by mouth daily.  . fenofibrate micronized (LOFIBRA) 134 MG capsule TAKE (1) CAPSULE DAILY BEFORE BREAKFAST.  . ferrous gluconate (FERGON) 325 MG tablet Take 325 mg by mouth daily with breakfast.    . fluticasone (FLONASE) 50 MCG/ACT nasal spray SPRAY 1  SPRAY IN EACH NOSTRIL TWICE DAILY. SHAKE GENTLY BEFORE EACH USE.  . furosemide (LASIX) 20 MG tablet TAKE 1 TABLET DAILY  . levothyroxine (SYNTHROID, LEVOTHROID) 150 MCG tablet Take 1 tablet (150 mcg total) by mouth daily.  Marland Kitchen LINZESS 145 MCG CAPS capsule TAKE (1) CAPSULE DAILY  . loratadine (CLARITIN) 10 MG tablet Take 10 mg by mouth daily as needed. Prn   . metFORMIN (GLUCOPHAGE) 500 MG tablet Take 1 tablet (500 mg total) by mouth every evening.  . Multiple Vitamin (MULTIVITAMIN) capsule Take 1 capsule by mouth daily.    Marland Kitchen nystatin (MYCOSTATIN) powder Apply topically 2 (two) times daily as needed.    Marland Kitchen omeprazole (PRILOSEC) 40 MG capsule TAKE (1) CAPSULE DAILY  . ONE TOUCH ULTRA TEST test strip CHECK BLOOD SUGAR TWICE A DAY AS DIRECTED  . potassium chloride (K-DUR) 10 MEQ tablet TAKE 1 TABLET DAILY  . Probiotic Product (HEALTHY COLON PO) Take 2 capsules by mouth daily.   . propranolol (INDERAL) 60 MG tablet TAKE  (1)  TABLET TWICE A DAY.  . rizatriptan (MAXALT-MLT) 10 MG disintegrating tablet Take 1 tablet (10 mg total) by mouth 3 (three) times daily as needed for migraine.  . sertraline (ZOLOFT) 100 MG tablet Take 2 tablets (200 mg total) by mouth daily.  Marland Kitchen tiZANidine (ZANAFLEX) 2 MG tablet 1 tablet twice daily for 2 weeks, then take 1 tablet 3 times daily  . traMADol (ULTRAM) 50 MG tablet Take 50 mg by mouth 2 (two) times daily.   . valsartan-hydrochlorothiazide (DIOVAN-HCT) 160-25 MG tablet TAKE 1 TABLET DAILY  . ziprasidone (GEODON) 60 MG  capsule TAKE 1 CAPSULE IN THE EVENING  . [DISCONTINUED] chlorproMAZINE (THORAZINE) 50 MG tablet Take 1 tablet (50 mg total) by mouth 2 (two) times daily.   Facility-Administered Encounter Medications as of 01/04/2016  Medication  . cyanocobalamin ((VITAMIN B-12)) injection 1,000 mcg      Review of Systems  Constitutional: Negative.   HENT: Positive for postnasal drip and sinus pressure.   Eyes: Negative.   Respiratory: Negative.   Cardiovascular:  Negative.   Gastrointestinal: Negative.   Endocrine: Negative.   Genitourinary: Negative.   Musculoskeletal: Negative.   Skin: Negative.   Allergic/Immunologic: Negative.   Neurological: Negative.   Hematological: Negative.   Psychiatric/Behavioral: Negative.        Objective:   Physical Exam  Constitutional: She is oriented to person, place, and time. She appears well-developed and well-nourished.  HENT:  Head: Normocephalic.  Tenderness with percussion over frontal sinus  Cardiovascular: Normal rate and regular rhythm.   Pulmonary/Chest: Effort normal and breath sounds normal.  Neurological: She is alert and oriented to person, place, and time.  Psychiatric: She has a normal mood and affect. Her behavior is normal.   BP 136/81 mmHg  Pulse 62  Temp(Src) 97.1 F (36.2 C) (Oral)  Ht 5\' 7"  (1.702 m)  Wt 315 lb (142.883 kg)  BMI 49.32 kg/m2  LMP 12/29/2015        Assessment & Plan:  1. Hypothyroidism, unspecified hypothyroidism type TSH was high normal in July    4. HLD (hyperlipidemia)  main issue is triglycerides which are probably a function of diabetes. LDL still not at goal on ZetiaLipid panel  5. Type 2 diabetes mellitus without complication, without long-term current use of insulA1c was 7.16 months ago. Need to update today. Generally sugars at averaged 1:30 fasting. - POCT glycosylated hemoglobin (Hb A1C)  6. Acute frontal sinusitis, recurrenRx amoxicillin 875 twice a day Mucinexce not specified   Wardell Honour MD

## 2016-01-04 NOTE — Patient Instructions (Signed)
Continue current medications. Continue good therapeutic lifestyle changes which include good diet and exercise. Fall precautions discussed with patient. If an FOBT was given today- please return it to our front desk. If you are over 50 years old - you may need Prevnar 13 or the adult Pneumonia vaccine.  **Flu shots are available--- please call and schedule a FLU-CLINIC appointment**  After your visit with us today you will receive a survey in the mail or online from [employer] regarding your care with us. Please take a moment to fill this out. Your feedback is very important to us as you can help us better understand your patient needs as well as improve your experience and satisfaction. WE CARE ABOUT YOU!!!    

## 2016-01-05 LAB — CMP14+EGFR
ALBUMIN: 4.5 g/dL (ref 3.5–5.5)
ALK PHOS: 76 IU/L (ref 39–117)
ALT: 30 IU/L (ref 0–32)
AST: 28 IU/L (ref 0–40)
Albumin/Globulin Ratio: 1.6 (ref 1.1–2.5)
BUN / CREAT RATIO: 22 (ref 9–23)
BUN: 17 mg/dL (ref 6–24)
CHLORIDE: 98 mmol/L (ref 96–106)
CO2: 25 mmol/L (ref 18–29)
Calcium: 9.7 mg/dL (ref 8.7–10.2)
Creatinine, Ser: 0.77 mg/dL (ref 0.57–1.00)
GFR calc Af Amer: 106 mL/min/{1.73_m2} (ref >59)
GFR calc non Af Amer: 92 mL/min/{1.73_m2} (ref >59)
GLOBULIN, TOTAL: 2.8 g/dL (ref 1.5–4.5)
Glucose: 122 mg/dL — ABNORMAL HIGH (ref 65–99)
Potassium: 4.2 mmol/L (ref 3.5–5.2)
SODIUM: 141 mmol/L (ref 134–144)
Total Protein: 7.3 g/dL (ref 6.0–8.5)

## 2016-01-05 LAB — LIPID PANEL
CHOLESTEROL TOTAL: 232 mg/dL — AB (ref 100–199)
Chol/HDL Ratio: 5.8 ratio units — ABNORMAL HIGH (ref 0.0–4.4)
HDL: 40 mg/dL (ref >39)
LDL Calculated: 118 mg/dL — ABNORMAL HIGH (ref 0–99)
Triglycerides: 371 mg/dL — ABNORMAL HIGH (ref 0–149)
VLDL CHOLESTEROL CAL: 74 mg/dL — AB (ref 5–40)

## 2016-01-15 NOTE — Progress Notes (Signed)
Botox Injection for Chronic migraines, greater than 15 per month, has had good relief with Botox injection in past prior injection August 2016  Dilution: 50 Units/ml  Informed consent was obtained after describing risks and benefits of the procedure with the patient. This includes bleeding, bruising, infection, excessive weakness, or medication side effects. A REMS form is on file and signed. Needle: 30-gauge 1/2 inch  The patient was placed in a sitting position. The standard protocol was used for Botox as follows, with 5 units of Botox injected at each site:   -Procerus muscle, midline injection  -Corrugator muscle, bilateral injection  -Frontalis muscle, bilateral injection, with 2 sites each side, medial injection was performed in the upper one third of the frontalis muscle, in the region vertical from the medial inferior edge of the superior orbital rim. The lateral injection was again in the upper one third of the forehead vertically above the lateral limbus of the cornea, 1.5 cm lateral to the medial injection site.  -Temporalis muscle injection, 4 sites, bilaterally. The first injection was 3 cm above the tragus of the ear, second injection site was 1.5 cm to 3 cm up from the first injection site in line with the tragus of the ear. The third injection site was 1.5-3 cm forward between the first 2 injection sites. The fourth injection site was 1.5 cm posterior to the second injection site.  -Occipitalis muscle injection, 3 sites, bilaterally. The first injection was done one half way between the occipital protuberance and the tip of the mastoid process behind the ear. The second injection site was done lateral and superior to the first, 1 fingerbreadth from the first injection. The third injection site was 1 fingerbreadth superiorly and medially from the first injection site.  -Cervical paraspinal muscle injection, 2 sites, bilateral, the first injection site was 1 cm from the midline of  the cervical spine, 3 cm inferior to the lower border of the occipital protuberance. The second injection site was 1.5 cm superiorly and laterally to the first injection site.  -Trapezius muscle injection was performed at 3 sites, bilaterally. The first injection site was in the upper trapezius muscle halfway between the inflection point of the neck, and the acromion. The second injection site was one half way between the acromion and the first injection site. The third injection was done between the first injection site and the inflection point of the neck.   A 200 unit bottle of Botox was used, 155 units were injected, the rest of the Botox was wasted. The patient tolerated the procedure well, there were no complications of the above procedure All injections were done  after negative drawback for blood. The patient tolerated the procedure well. Post procedure instructions were given. Plan repeat injection in 3 months.

## 2016-01-19 ENCOUNTER — Ambulatory Visit (INDEPENDENT_AMBULATORY_CARE_PROVIDER_SITE_OTHER): Payer: Medicare Other | Admitting: *Deleted

## 2016-01-19 DIAGNOSIS — E538 Deficiency of other specified B group vitamins: Secondary | ICD-10-CM | POA: Diagnosis not present

## 2016-01-19 NOTE — Patient Instructions (Signed)

## 2016-01-19 NOTE — Progress Notes (Signed)
Pt given B12 injection IM right deltoid and tolerated well. 

## 2016-01-21 ENCOUNTER — Other Ambulatory Visit: Payer: Self-pay | Admitting: Neurology

## 2016-01-30 DIAGNOSIS — G4733 Obstructive sleep apnea (adult) (pediatric): Secondary | ICD-10-CM | POA: Diagnosis not present

## 2016-01-30 DIAGNOSIS — R0902 Hypoxemia: Secondary | ICD-10-CM | POA: Diagnosis not present

## 2016-02-01 ENCOUNTER — Other Ambulatory Visit: Payer: Self-pay | Admitting: Family Medicine

## 2016-02-01 ENCOUNTER — Encounter: Payer: Self-pay | Admitting: Physical Medicine & Rehabilitation

## 2016-02-01 ENCOUNTER — Encounter: Payer: Medicare Other | Attending: Physical Medicine & Rehabilitation

## 2016-02-01 ENCOUNTER — Ambulatory Visit (HOSPITAL_BASED_OUTPATIENT_CLINIC_OR_DEPARTMENT_OTHER): Payer: Medicare Other | Admitting: Physical Medicine & Rehabilitation

## 2016-02-01 VITALS — BP 122/66 | HR 54 | Resp 14

## 2016-02-01 DIAGNOSIS — G43719 Chronic migraine without aura, intractable, without status migrainosus: Secondary | ICD-10-CM | POA: Insufficient documentation

## 2016-02-01 NOTE — Patient Instructions (Signed)
You received a botulinum toxin injection for chronic headache prevention. You may have soreness in your injection site, you may use ice for 20-30 minutes every 2 hours to help with soreness.  Maximum effect of the injection will be at around 1 week. Other side effects include possible muscle weakness which will subside as the medication wears off.  Medication may be repeated every 3 months as needed. 

## 2016-02-01 NOTE — Progress Notes (Signed)
Botox Injection for Chronic migraines, greater than 15 per month, has had good relief with Botox injection in past prior injection August 2016  Dilution: 50 Units/ml  Informed consent was obtained after describing risks and benefits of the procedure with the patient. This includes bleeding, bruising, infection, excessive weakness, or medication side effects. A REMS form is on file and signed. Needle: 30-gauge 1/2 inch for the frontalis, temporalis, procerus and corrugator. 30-gauge 1 inch needle electrode with audio EMG guidance for the suboccipital and posterior cervical musculature The patient was placed in a sitting position. The standard protocol was used for Botox as follows, with 5 units of Botox injected at each site:   -Procerus muscle, midline injection  -Corrugator muscle, bilateral injection  -Frontalis muscle, bilateral injection, with 2 sites each side, medial injection was performed in the upper one third of the frontalis muscle, in the region vertical from the medial inferior edge of the superior orbital rim. The lateral injection was again in the upper one third of the forehead vertically above the lateral limbus of the cornea, 1.5 cm lateral to the medial injection site.  -Temporalis muscle injection, 4 sites, bilaterally. The first injection was 3 cm above the tragus of the ear, second injection site was 1.5 cm to 3 cm up from the first injection site in line with the tragus of the ear. The third injection site was 1.5-3 cm forward between the first 2 injection sites. The fourth injection site was 1.5 cm posterior to the second injection site.  -Occipitalis muscle injection, 3 sites, bilaterally. The first injection was done one half way between the occipital protuberance and the tip of the mastoid process behind the ear. The second injection site was done lateral and superior to the first, 1 fingerbreadth from the first injection. The third injection site was 1 fingerbreadth  superiorly and medially from the first injection site.  -Cervical paraspinal muscle injection, 3 sites, bilateral, the first injection site was 1 cm from the midline of the cervical spine, 3 cm inferior to the lower border of the occipital protuberance. The second injection site was 1.5 cm superiorly and laterally to the first injection site.   -Trapezius muscle injection was performed at 3 sites, bilaterally. The first injection site was in the upper trapezius muscle halfway between the inflection point of the neck, and the acromion. The second injection site was one half way between the acromion and the first injection site. The third injection was done between the first injection site and the inflection point of the neck.   A 200 unit bottle of Botox was used, 165 units were injected, the rest of the Botox was wasted. The patient tolerated the procedure well, there were no complications of the above procedure.   All injections were done  after negative drawback for blood. The patient tolerated the procedure well. Post procedure instructions were given. Plan repeat injection in 3 months.

## 2016-02-17 ENCOUNTER — Ambulatory Visit (INDEPENDENT_AMBULATORY_CARE_PROVIDER_SITE_OTHER): Payer: Medicare Other | Admitting: *Deleted

## 2016-02-17 DIAGNOSIS — E538 Deficiency of other specified B group vitamins: Secondary | ICD-10-CM

## 2016-02-17 NOTE — Patient Instructions (Signed)

## 2016-02-17 NOTE — Progress Notes (Signed)
Pt given B12 injection IM left deltoid and tolerated well. °

## 2016-02-19 ENCOUNTER — Telehealth: Payer: Self-pay | Admitting: Neurology

## 2016-02-19 NOTE — Telephone Encounter (Signed)
[facility] would have done her benefits investigation and would have been the ones to advise her regarding her financial obligation for Botox.  She should call [facility] to have them explain to her why she owes this amt./fim

## 2016-02-19 NOTE — Telephone Encounter (Signed)
Pt says she was sent to [facility] for Botox and states that she is being charged over $400 dollars for one round. She is very upset about not knowing the price of the injection. She says she was not told what the cost would be. She says she has had 2 rounds at [facility] but will not have any more. She says Dr. Jannifer Franklin wanted to see her after 4 rounds of botox. Please call and advise [phone]

## 2016-02-22 NOTE — Telephone Encounter (Signed)
Pt returned call and was told she would need to speak with [facility]. She does not understand why she was sent to them to begin with for botox when she used to come here. Pt wants to know if she needs to have a return appt. Please call and advise [phone]

## 2016-02-22 NOTE — Telephone Encounter (Signed)
Spoke to pt and made an appt for her on 05/10/16 at 8:00 per Dr. Jannifer Franklin request.

## 2016-02-22 NOTE — Telephone Encounter (Signed)
I called patient. The patient was told that each visit for Botox headache $400 co-pay for the visit. She is unable to afford anymore Botox injections. I will see her back in office in a couple months.

## 2016-02-22 NOTE — Telephone Encounter (Signed)
Called pt back. Advised that because of her insurance she has to be buy and bill. Our office does not do this. That is why we had to refer her to a different office. Advised she will have to call [facility] to discuss botox coverage. She stated "They did not tell me it was going to cost me that much. They stated "I signed a paper stating I would have to pay" and now I  Am out 800 dollars. Calling them won't do me any good. I have called [facility] and their office." Advised Dr Jannifer Franklin out of office until Wednesday and I will give him the message to see if she needs to come in for f/u apt. She stated "I am not going for any more botox treatments there". Advised we will call her back. She verbalized understanding.

## 2016-02-27 DIAGNOSIS — R0902 Hypoxemia: Secondary | ICD-10-CM | POA: Diagnosis not present

## 2016-02-27 DIAGNOSIS — G4733 Obstructive sleep apnea (adult) (pediatric): Secondary | ICD-10-CM | POA: Diagnosis not present

## 2016-03-10 ENCOUNTER — Other Ambulatory Visit: Payer: Self-pay | Admitting: Neurology

## 2016-03-18 DIAGNOSIS — G4733 Obstructive sleep apnea (adult) (pediatric): Secondary | ICD-10-CM | POA: Diagnosis not present

## 2016-03-21 ENCOUNTER — Ambulatory Visit (INDEPENDENT_AMBULATORY_CARE_PROVIDER_SITE_OTHER): Payer: Medicare Other | Admitting: *Deleted

## 2016-03-21 DIAGNOSIS — E538 Deficiency of other specified B group vitamins: Secondary | ICD-10-CM | POA: Diagnosis not present

## 2016-03-21 DIAGNOSIS — E119 Type 2 diabetes mellitus without complications: Secondary | ICD-10-CM | POA: Diagnosis not present

## 2016-03-21 DIAGNOSIS — H40033 Anatomical narrow angle, bilateral: Secondary | ICD-10-CM | POA: Diagnosis not present

## 2016-03-21 LAB — HM DIABETES EYE EXAM

## 2016-03-21 NOTE — Progress Notes (Signed)
Pt given B12 injection IM right deltoid and tolerated well. 

## 2016-03-28 ENCOUNTER — Other Ambulatory Visit: Payer: Self-pay | Admitting: Neurology

## 2016-03-29 DIAGNOSIS — G4733 Obstructive sleep apnea (adult) (pediatric): Secondary | ICD-10-CM | POA: Diagnosis not present

## 2016-03-29 DIAGNOSIS — R0902 Hypoxemia: Secondary | ICD-10-CM | POA: Diagnosis not present

## 2016-04-21 ENCOUNTER — Ambulatory Visit (INDEPENDENT_AMBULATORY_CARE_PROVIDER_SITE_OTHER): Payer: Medicare Other | Admitting: *Deleted

## 2016-04-21 DIAGNOSIS — E538 Deficiency of other specified B group vitamins: Secondary | ICD-10-CM

## 2016-04-21 NOTE — Patient Instructions (Signed)

## 2016-04-21 NOTE — Progress Notes (Signed)
Vitamin b12 injection given and patient tolerated well.  

## 2016-04-25 ENCOUNTER — Other Ambulatory Visit: Payer: Self-pay | Admitting: Neurology

## 2016-04-28 ENCOUNTER — Telehealth: Payer: Self-pay | Admitting: *Deleted

## 2016-04-28 DIAGNOSIS — R0902 Hypoxemia: Secondary | ICD-10-CM | POA: Diagnosis not present

## 2016-04-28 DIAGNOSIS — G4733 Obstructive sleep apnea (adult) (pediatric): Secondary | ICD-10-CM | POA: Diagnosis not present

## 2016-04-28 MED ORDER — RIZATRIPTAN BENZOATE 10 MG PO TBDP: ORAL_TABLET | ORAL | Status: DC

## 2016-04-28 NOTE — Telephone Encounter (Signed)
Pt requesting additional refill of Maxalt. Recent rx for #9 sent in on 04/09/16.

## 2016-04-28 NOTE — Telephone Encounter (Signed)
I will call in another refill for the Maxalt.

## 2016-04-28 NOTE — Telephone Encounter (Signed)
------------------------------------------------------------ ------------------------------------------------------------   [facility]     CID [id number]  Patient Morgan [name]       Pt's Dr [place] Code 336 Phone# [phone] * DOB [id number]     RE PLS CALL REGARDING THE RX DENIAL THAT WAS         SENT IN                                              Disp:Y/N N If Y = C/B If No Response In 90minutes ============================================================

## 2016-05-02 ENCOUNTER — Ambulatory Visit: Payer: Medicare Other | Admitting: Physical Medicine & Rehabilitation

## 2016-05-10 ENCOUNTER — Encounter: Payer: Self-pay | Admitting: Neurology

## 2016-05-10 ENCOUNTER — Ambulatory Visit (INDEPENDENT_AMBULATORY_CARE_PROVIDER_SITE_OTHER): Payer: Medicare Other | Admitting: Neurology

## 2016-05-10 VITALS — BP 130/79 | HR 61 | Ht 67.0 in | Wt 313.0 lb

## 2016-05-10 DIAGNOSIS — G43719 Chronic migraine without aura, intractable, without status migrainosus: Secondary | ICD-10-CM | POA: Diagnosis not present

## 2016-05-10 MED ORDER — TIZANIDINE HCL 2 MG PO TABS: 2.0000 mg | ORAL_TABLET | Freq: Three times a day (TID) | ORAL | Status: DC

## 2016-05-10 MED ORDER — SUMATRIPTAN SUCCINATE 6 MG/0.5ML ~~LOC~~ SOSY: 6.0000 mg | PREFILLED_SYRINGE | SUBCUTANEOUS | Status: DC | PRN

## 2016-05-10 MED ORDER — GABAPENTIN 300 MG PO CAPS: ORAL_CAPSULE | ORAL | Status: DC

## 2016-05-10 NOTE — Patient Instructions (Signed)
   Neurontin (gabapentin) may result in drowsiness, ankle swelling, gait instability, or possibly dizziness. Please contact our office if significant side effects occur with this medication.  

## 2016-05-10 NOTE — Progress Notes (Signed)
Reason for visit: Intractable migraine headache  Morgan [name] is an 50 y.o. female  History of present illness:  Morgan [name] is a 50 year old right-handed white female with a history of intractable migraine headaches, morbid obesity, and bipolar disorder. The patient has had ongoing difficulties with her headaches. She has had 3 injections of Botox, the last was done in November 2016. The patient has stopped the Botox mainly because of the expense of the procedure, the co-pay was about $400 each visit. The patient has had an increase in her headaches over the last 3 months. The patient is averaging about 16 headache days a month, but the severity of the headaches has increased, the patient will have at least 8 headaches each month that are quite severe, and incapacitating to her. She has significant photophobia, she may have to wear dark glasses inside the house. She has a lot of nausea, but no vomiting. The patient indicates that getting heat exposure may will bring on the headache. The Maxalt is becoming less effective in controlling the headaches. The patient has some neck discomfort, Zanaflex does help this. She is on propranolol at 60 mg twice daily. She returns to this office for an evaluation.  Past Medical History  Diagnosis Date  . Unspecified hypothyroidism   . Constipation, chronic   . Vitamin B 12 deficiency   . Barrett's esophagus   . Osteoarthritis     bilateral knee  . Gastroparesis   . Allergic rhinitis   . Chronic respiratory failure ([place])   . Migraine headache   . Hypertension   . Diabetes mellitus without complication ([place])   . GERD (gastroesophageal reflux disease)   . OSA (obstructive sleep apnea)     uses a cpap  . Sleep apnea     has c-pap  . Vitamin D deficiency   . Bipolar affective disorder ([place])   . Hyperlipidemia   . Morbid obesity ([place])   . Intractable chronic migraine without aura 06/04/2015    Past Surgical History  Procedure Laterality Date   . Cholecystectomy  2005  . Appendectomy  1978  . Tonsillectomy  at age 75  . Shoulder arthroscopy  7/12    left-dsc  . Trigger finger release  12/20/2012    Procedure: RELEASE TRIGGER FINGER/A-1 PULLEY;  Surgeon: Tennis Must, MD;  Location: [facility];  Service: Orthopedics;  Laterality: Left;  LEFT TRIGGER THUMB RELEASE    Family History  Problem Relation Age of Onset  . Asthma Father   . Allergies Father   . Heart disease Father     enlarged heart  . Peripheral vascular disease Father   . Diabetes Father   . Hyperlipidemia Father   . Arthritis Father   . Asthma Sister   . Cancer Sister     colon at 53 yr old.  . Colon cancer Sister   . Allergies Mother   . Stroke Mother 27    with hemi paralysis  . Diabetes Mother   . Hyperlipidemia Mother   . Hypertension Mother   . GI problems Mother   . Arthritis Mother   . Allergies Brother   . Early death Brother 9    congenital abormality  . Allergies Sister   . Diabetes Sister   . Asthma Sister   . Colon polyps Sister   . Hyperlipidemia Sister   . GI problems Sister     gastroporesis   . Liver disease Sister  fatty liver  . Diabetes Daughter   . Hyperlipidemia Daughter   . Hypertension Daughter   . Migraines Neg Hx     Social history:  reports that she has never smoked. She has never used smokeless tobacco. She reports that she does not drink alcohol or use illicit drugs.    Allergies  Allergen Reactions  . Codeine     REACTION: increased BP  . Ketoconazole     REACTION: hives, swelling  . Pravachol [Pravastatin Sodium] Swelling    Throat swelling  . Statins     REACTION: leg crapms    Medications:  Prior to Admission medications   Medication Sig Start Date End Date Taking? Authorizing Provider  Cholecalciferol (VITAMIN D) 2000 UNITS CAPS Take 2,000 Units by mouth daily.    Historical Provider, MD  diclofenac sodium (VOLTAREN) 1 % GEL Use as directed for knee pain 10/02/15   Historical  Provider, MD  ezetimibe (ZETIA) 10 MG tablet Take 1 tablet (10 mg total) by mouth daily. 06/17/15   Wardell Honour, MD  fenofibrate micronized (LOFIBRA) 134 MG capsule TAKE (1) CAPSULE DAILY BEFORE BREAKFAST. 01/04/16   Wardell Honour, MD  ferrous gluconate (FERGON) 325 MG tablet Take 325 mg by mouth daily with breakfast.      Historical Provider, MD  fluticasone (FLONASE) 50 MCG/ACT nasal spray SPRAY 1 SPRAY IN EACH NOSTRIL TWICE DAILY. SHAKE GENTLY BEFORE EACH USE. 12/10/14   Wardell Honour, MD  furosemide (LASIX) 20 MG tablet Take 1 tablet (20 mg total) by mouth daily. 01/04/16   Wardell Honour, MD  levothyroxine (SYNTHROID, LEVOTHROID) 150 MCG tablet Take 1 tablet (150 mcg total) by mouth daily. 01/04/16   Wardell Honour, MD  Linaclotide [facility]) 145 MCG CAPS capsule TAKE (1) CAPSULE DAILY 01/04/16   Wardell Honour, MD  loratadine (CLARITIN) 10 MG tablet Take 10 mg by mouth daily as needed. Prn     Historical Provider, MD  metFORMIN (GLUCOPHAGE) 500 MG tablet Take 1 tablet (500 mg total) by mouth every evening. 01/04/16   Wardell Honour, MD  Multiple Vitamin (MULTIVITAMIN) capsule Take 1 capsule by mouth daily.      Historical Provider, MD  nystatin (MYCOSTATIN) powder Apply topically 2 (two) times daily as needed.      Historical Provider, MD  omeprazole (PRILOSEC) 40 MG capsule TAKE (1) CAPSULE DAILY 01/04/16   Wardell Honour, MD  ONE TOUCH ULTRA TEST test strip CHECK BLOOD SUGAR TWICE A DAY AS DIRECTED 02/02/16   Wardell Honour, MD  potassium chloride (K-DUR) 10 MEQ tablet Take 1 tablet (10 mEq total) by mouth daily. 01/04/16   Wardell Honour, MD  Probiotic Product (HEALTHY COLON PO) Take 2 capsules by mouth daily.     Historical Provider, MD  propranolol (INDERAL) 60 MG tablet TAKE (1) TABLET TWICE A DAY. 01/21/16   Kathrynn Ducking, MD  rizatriptan (MAXALT-MLT) 10 MG disintegrating tablet TAKE 1 TABLET UP TO 3 TIMES A DAY AS NEEDED FOR SEVERE HEADACHE 04/28/16   Kathrynn Ducking,  MD  sertraline (ZOLOFT) 100 MG tablet Take 2 tablets (200 mg total) by mouth daily. 01/04/16   Wardell Honour, MD  tiZANidine (ZANAFLEX) 2 MG tablet 1 tablet twice daily for 2 weeks, then take 1 tablet 3 times daily 11/16/15   Kathrynn Ducking, MD  traMADol (ULTRAM) 50 MG tablet Take 50 mg by mouth 2 (two) times daily.  02/27/13   Historical Provider, MD  valsartan-hydrochlorothiazide (DIOVAN-HCT) 160-25 MG tablet Take 1 tablet by mouth daily. 01/04/16   Wardell Honour, MD  ziprasidone (GEODON) 60 MG capsule TAKE 1 CAPSULE IN THE EVENING 01/04/16   Wardell Honour, MD    ROS:  Out of a complete 14 system review of symptoms, the patient complains only of the following symptoms, and all other reviewed systems are negative.  Dizziness, headache Depression, anxiety  Blood pressure 130/79, pulse 61, height 5\' 7"  (1.702 m), weight 313 lb (141.976 kg).  Physical Exam  General: The patient is alert and cooperative at the time of the examination. The patient is morbidly obese.  Skin: No significant peripheral edema is noted.   Neurologic Exam  Mental status: The patient is alert and oriented x 3 at the time of the examination. The patient has apparent normal recent and remote memory, with an apparently normal attention span and concentration ability.   Cranial nerves: Facial symmetry is present. Speech is normal, no aphasia or dysarthria is noted. Extraocular movements are full. Visual fields are full.  Motor: The patient has good strength in all 4 extremities.  Sensory examination: Soft touch sensation is symmetric on the face, arms, and legs.  Coordination: The patient has good finger-nose-finger and heel-to-shin bilaterally.  Gait and station: The patient has a normal gait. Tandem gait is normal. Romberg is negative. No drift is seen.  Reflexes: Deep tendon reflexes are symmetric.   Assessment/Plan:  1. Intractable migraine headache  The patient will continue the propranolol  for now, in the future we may consider tapering this medication off and use verapamil. The patient will be given a trial on gabapentin. The Maxalt will be discontinued, and the patient will be given a prescription for injectable Imitrex. The patient will follow-up in 3-4 months. A prescription was written for the tizanidine taking 2 mg 3 times daily.  Jill Alexanders MD 05/10/2016 8:09 PM  [facility] [address] [place], [place] [address]  Phone [phone] Fax [phone]

## 2016-05-23 ENCOUNTER — Ambulatory Visit (INDEPENDENT_AMBULATORY_CARE_PROVIDER_SITE_OTHER): Payer: Medicare Other | Admitting: *Deleted

## 2016-05-23 ENCOUNTER — Other Ambulatory Visit: Payer: Self-pay | Admitting: Neurology

## 2016-05-23 DIAGNOSIS — E538 Deficiency of other specified B group vitamins: Secondary | ICD-10-CM | POA: Diagnosis not present

## 2016-05-23 NOTE — Patient Instructions (Signed)

## 2016-05-23 NOTE — Progress Notes (Signed)
Pt given B12 injection IM right deltoid and tolerated well. 

## 2016-05-29 DIAGNOSIS — G4733 Obstructive sleep apnea (adult) (pediatric): Secondary | ICD-10-CM | POA: Diagnosis not present

## 2016-05-29 DIAGNOSIS — R0902 Hypoxemia: Secondary | ICD-10-CM | POA: Diagnosis not present

## 2016-06-06 DIAGNOSIS — N921 Excessive and frequent menstruation with irregular cycle: Secondary | ICD-10-CM | POA: Diagnosis not present

## 2016-06-06 DIAGNOSIS — Z1231 Encounter for screening mammogram for malignant neoplasm of breast: Secondary | ICD-10-CM | POA: Diagnosis not present

## 2016-06-06 DIAGNOSIS — Z6841 Body Mass Index (BMI) 40.0 and over, adult: Secondary | ICD-10-CM

## 2016-06-06 DIAGNOSIS — Z01419 Encounter for gynecological examination (general) (routine) without abnormal findings: Secondary | ICD-10-CM | POA: Diagnosis not present

## 2016-06-06 DIAGNOSIS — Z124 Encounter for screening for malignant neoplasm of cervix: Secondary | ICD-10-CM | POA: Diagnosis not present

## 2016-06-08 ENCOUNTER — Other Ambulatory Visit: Payer: Self-pay | Admitting: *Deleted

## 2016-06-08 DIAGNOSIS — E785 Hyperlipidemia, unspecified: Secondary | ICD-10-CM

## 2016-06-08 MED ORDER — EZETIMIBE 10 MG PO TABS: 10.0000 mg | ORAL_TABLET | Freq: Every day | ORAL | Status: DC

## 2016-06-20 DIAGNOSIS — H1013 Acute atopic conjunctivitis, bilateral: Secondary | ICD-10-CM | POA: Diagnosis not present

## 2016-06-23 ENCOUNTER — Ambulatory Visit (INDEPENDENT_AMBULATORY_CARE_PROVIDER_SITE_OTHER): Payer: Medicare Other | Admitting: *Deleted

## 2016-06-23 DIAGNOSIS — E538 Deficiency of other specified B group vitamins: Secondary | ICD-10-CM

## 2016-06-23 NOTE — Progress Notes (Signed)
Pt given B12 injection IM left deltoid and tolerated well. °

## 2016-06-28 DIAGNOSIS — G4733 Obstructive sleep apnea (adult) (pediatric): Secondary | ICD-10-CM | POA: Diagnosis not present

## 2016-06-28 DIAGNOSIS — R0902 Hypoxemia: Secondary | ICD-10-CM | POA: Diagnosis not present

## 2016-06-29 ENCOUNTER — Encounter: Payer: Self-pay | Admitting: Pharmacist

## 2016-06-29 ENCOUNTER — Encounter: Payer: Self-pay | Admitting: Gastroenterology

## 2016-06-29 ENCOUNTER — Ambulatory Visit (INDEPENDENT_AMBULATORY_CARE_PROVIDER_SITE_OTHER): Payer: Medicare Other | Admitting: Pharmacist

## 2016-06-29 VITALS — BP 122/70 | HR 78 | Ht 64.5 in | Wt 314.0 lb

## 2016-06-29 DIAGNOSIS — Z1211 Encounter for screening for malignant neoplasm of colon: Secondary | ICD-10-CM

## 2016-06-29 DIAGNOSIS — Z0000 Encounter for general adult medical examination without abnormal findings: Secondary | ICD-10-CM

## 2016-06-29 DIAGNOSIS — Z23 Encounter for immunization: Secondary | ICD-10-CM

## 2016-06-29 DIAGNOSIS — Z299 Encounter for prophylactic measures, unspecified: Secondary | ICD-10-CM

## 2016-06-29 NOTE — Progress Notes (Signed)
Subjective:   Morgan [name] is a 50 y.o. female who presents for a subsequent Medicare Annual Wellness Visit.  Morgan [name] is doing well.  She reports that she is meeting with Dr Redmond Pulling with [facility] to discuss bariatric surgery 07/14/16.  She has lost about 10# since I last saw her which was over a year ago 08/2014.   Last A1c was at goal.  Review of Systems  Review of Systems  Constitutional: Negative.   Eyes: Negative.   Respiratory: Negative.   Cardiovascular: Negative.   Gastrointestinal: Positive for constipation (has IBS-C).  Genitourinary: Negative.   Musculoskeletal: Positive for back pain.  Skin: Negative.   Neurological: Positive for headaches (has migraine HAs).  Endo/Heme/Allergies: Negative.   Psychiatric/Behavioral: Positive for depression.    Current Medications (verified) Outpatient Encounter Prescriptions as of 06/29/2016  Medication Sig  . Cholecalciferol (VITAMIN D) 2000 UNITS CAPS Take 2,000 Units by mouth daily.  . diclofenac sodium (VOLTAREN) 1 % GEL Use as directed for knee pain  . ezetimibe (ZETIA) 10 MG tablet Take 1 tablet (10 mg total) by mouth daily.  . fenofibrate micronized (LOFIBRA) 134 MG capsule TAKE (1) CAPSULE DAILY BEFORE BREAKFAST.  . fluticasone (FLONASE) 50 MCG/ACT nasal spray SPRAY 1 SPRAY IN EACH NOSTRIL TWICE DAILY. SHAKE GENTLY BEFORE EACH USE.  . furosemide (LASIX) 20 MG tablet Take 1 tablet (20 mg total) by mouth daily.  Marland Kitchen gabapentin (NEURONTIN) 300 MG capsule One capsule twice a day for 2 weeks, then take one capsule in the morning and two in the evening (Patient taking differently: Take 300 mg by mouth. one capsule in the morning and two in the evening)  . levothyroxine (SYNTHROID, LEVOTHROID) 150 MCG tablet Take 1 tablet (150 mcg total) by mouth daily.  . Linaclotide (LINZESS) 145 MCG CAPS capsule TAKE (1) CAPSULE DAILY  . loratadine (CLARITIN) 10 MG tablet Take 10 mg by mouth daily as needed. Prn   . metFORMIN  (GLUCOPHAGE) 500 MG tablet Take 1 tablet (500 mg total) by mouth every evening.  . Multiple Vitamin (MULTIVITAMIN) capsule Take 1 capsule by mouth daily.    Marland Kitchen nystatin (MYCOSTATIN) powder Apply topically 2 (two) times daily as needed.    Marland Kitchen omeprazole (PRILOSEC) 40 MG capsule TAKE (1) CAPSULE DAILY  . ONE TOUCH ULTRA TEST test strip CHECK BLOOD SUGAR TWICE A DAY AS DIRECTED  . potassium chloride (K-DUR) 10 MEQ tablet Take 1 tablet (10 mEq total) by mouth daily.  . Probiotic Product (HEALTHY COLON PO) Take 2 capsules by mouth daily.   . propranolol (INDERAL) 60 MG tablet TAKE (1) TABLET TWICE A DAY.  . sertraline (ZOLOFT) 100 MG tablet Take 2 tablets (200 mg total) by mouth daily.  . SUMAtriptan (IMITREX) 6 MG/0.5ML SOSY injection Inject 0.5 mLs (6 mg total) into the skin every 2 (two) hours as needed for migraine or headache.  Marland Kitchen tiZANidine (ZANAFLEX) 2 MG tablet Take 1 tablet (2 mg total) by mouth 3 (three) times daily.  . traMADol (ULTRAM) 50 MG tablet Take 50 mg by mouth 2 (two) times daily.   . valsartan-hydrochlorothiazide (DIOVAN-HCT) 160-25 MG tablet Take 1 tablet by mouth daily.  . ziprasidone (GEODON) 60 MG capsule TAKE 1 CAPSULE IN THE EVENING  . ferrous gluconate (FERGON) 325 MG tablet Take 325 mg by mouth daily with breakfast. Reported on 06/29/2016  . PAZEO 0.7 % SOLN Place 1 drop into both eyes every morning.  . RESTASIS MULTIDOSE 0.05 % ophthalmic emulsion Place 1 drop  into both eyes 2 (two) times daily.   Facility-Administered Encounter Medications as of 06/29/2016  Medication  . cyanocobalamin ((VITAMIN B-12)) injection 1,000 mcg    Allergies (verified) Codeine; Ketoconazole; Pravachol; and Statins   History: Past Medical History  Diagnosis Date  . Unspecified hypothyroidism   . Constipation, chronic   . Vitamin B 12 deficiency   . Barrett's esophagus   . Osteoarthritis     bilateral knee  . Gastroparesis   . Allergic rhinitis   . Chronic respiratory failure ([place])     . Migraine headache   . Hypertension   . Diabetes mellitus without complication ([place])   . GERD (gastroesophageal reflux disease)   . OSA (obstructive sleep apnea)     uses a cpap  . Sleep apnea     has c-pap  . Vitamin D deficiency   . Bipolar affective disorder ([place])   . Hyperlipidemia   . Morbid obesity ([place])   . Intractable chronic migraine without aura 06/04/2015  . Dry eye    Past Surgical History  Procedure Laterality Date  . Cholecystectomy  2005  . Appendectomy  1978  . Tonsillectomy  at age 65  . Shoulder arthroscopy  7/12    left-dsc  . Trigger finger release  12/20/2012    Procedure: RELEASE TRIGGER FINGER/A-1 PULLEY;  Surgeon: Tennis Must, MD;  Location: [place];  Service: Orthopedics;  Laterality: Left;  LEFT TRIGGER THUMB RELEASE   Family History  Problem Relation Age of Onset  . Asthma Father   . Allergies Father   . Heart disease Father     enlarged heart  . Peripheral vascular disease Father   . Diabetes Father   . Hyperlipidemia Father   . Arthritis Father   . Asthma Sister   . Cancer Sister     colon at 10 yr old.  . Colon cancer Sister   . Allergies Mother   . Stroke Mother 21    with hemi paralysis  . Diabetes Mother   . Hyperlipidemia Mother   . Hypertension Mother   . GI problems Mother   . Arthritis Mother   . Allergies Brother   . Early death Brother 9    congenital abormality  . Allergies Sister   . Diabetes Sister   . Asthma Sister   . Colon polyps Sister   . Hyperlipidemia Sister   . GI problems Sister     gastroporesis   . Liver disease Sister     fatty liver  . Stroke Sister     intercrandial bleed  . Diabetes Daughter   . Hyperlipidemia Daughter   . Hypertension Daughter   . Migraines Neg Hx   . Diabetes Brother   . Hypertension Brother   . Hyperlipidemia Brother    Social History   Occupational History  . disabled    Social History Main Topics  . Smoking status: Never Smoker   . Smokeless  tobacco: Never Used  . Alcohol Use: No  . Drug Use: No  . Sexual Activity: No    Do you feel safe at home?  Yes Are there smokers in your home (other than you)? No  Dietary issues and exercise activities: Current Exercise Habits: Home exercise routine, Time (Minutes): 20, Frequency (Times/Week): 1, Weekly Exercise (Minutes/Week): 20, Intensity: Mild  Current Dietary habits:  Trying to limit serving sizes of high calorie foods.  She is also limiting her CHO intake   Objective:    Today's  Vitals   06/29/16 0852  BP: 122/70  Pulse: 78  Height: 5' 4.5" (1.638 m)  Weight: 314 lb (142.429 kg)  PainSc: 6   PainLoc: Knee   Body mass index is 53.08 kg/(m^2).  Activities of Daily Living In your present state of health, do you have any difficulty performing the following activities: 06/29/2016  Hearing? N  Vision? N  Difficulty concentrating or making decisions? N  Walking or climbing stairs? Y  Dressing or bathing? N  Doing errands, shopping? Y  Preparing Food and eating ? N  Using the Toilet? N  In the past six months, have you accidently leaked urine? N  Do you have problems with loss of bowel control? N  Managing your Medications? N  Managing your Finances? N  Housekeeping or managing your Housekeeping? N     Cardiac Risk Factors include: diabetes mellitus;dyslipidemia;family history of premature cardiovascular disease;hypertension;obesity (BMI >30kg/m2);sedentary lifestyle  Depression Screen PHQ 2/9 Scores 06/29/2016 01/04/2016 05/01/2015 12/10/2014  PHQ - 2 Score 3 1 5 1   PHQ- 9 Score 6 - 13 -     Fall Risk Fall Risk  06/29/2016 02/01/2016 01/04/2016 10/30/2015 05/01/2015  Falls in the past year? No No No No No    Cognitive Function: MMSE - Mini Mental State Exam 06/29/2016  Orientation to time 5  Orientation to Place 5  Registration 3  Attention/ Calculation 5  Recall 3  Language- name 2 objects 2  Language- repeat 1  Language- follow 3 step command 3  Language-  read & follow direction 1  Write a sentence 1  Copy design 1  Total score 30    Immunizations and Health Maintenance Immunization History  Administered Date(s) Administered  . Pneumococcal Polysaccharide-23 06/29/2016  . Tdap 07/09/2015   Health Maintenance Due  Topic Date Due  . COLONOSCOPY  09/15/2009    Patient Care Team: Wardell Honour, MD as PCP - General (Family Medicine) Allyn Kenner, DO as Attending Physician (Obstetrics and Gynecology) Marchia Bond, MD as Attending Physician (Orthopedic Surgery) Vinie Sill Mixon as Consulting Physician (Unknown Physician Specialty) Adelina Mings Margret Chance, MD as Referring Physician (Optometry) Deneise Lever, MD as Consulting Physician (Pulmonary Disease) Almedia Balls, MD as Consulting Physician (Orthopedic Surgery)  Indicate any recent Medical Services you may have received from other than [facility] providers in the past year (date may be approximate).    Assessment:    Annual Wellness Visit    Screening Tests Health Maintenance  Topic Date Due  . COLONOSCOPY  09/15/2009  . PNEUMOCOCCAL POLYSACCHARIDE VACCINE (1) 07/03/2016 (Originally 08/10/1969)  . HIV Screening  09/03/2016 (Originally 08/10/1982)  . INFLUENZA VACCINE  10/12/2016 (Originally 07/12/2016)  . HEMOGLOBIN A1C  07/03/2016  . FOOT EXAM  01/03/2017  . OPHTHALMOLOGY EXAM  03/21/2017  . MAMMOGRAM  05/26/2017  . PAP SMEAR  05/28/2018  . TETANUS/TDAP  07/08/2025        Plan:   During the course of the visit Morgan [name] was educated and counseled about the following appropriate screening and preventive services:   Vaccines to include Pneumoccal, Influenza, Td, - received Pneumovax 23 in office today  Colorectal cancer screening - referral made today for follow up colonoscopy  Diabetes - due to have A1c checked at appt with PCP next week 07/05/16  Mammogram - UTD (05/2016)  PAP - UTD, every 2 years  Glaucoma screening / Diabetic Eye Exam - UTD Nutrition counseling -    Increase non-starchy vegetables - carrots, green bean, squash, zucchini,  tomatoes, onions, peppers, spinach and other green leafy vegetables,  cabbage, lettuce, cucumbers, asparagus, okra (not fried), eggplant.  Limit  sugar and processed foods (cakes, cookies, ice cream, crackers and  chips.  Increase fresh fruit but limit serving sizes 1/2 cup or about the size  of tennis or baseball. Limit red meat to no more than 1-2 times per week  (serving size about the size of your palm).  Choose whole grains / lean  proteins - whole wheat bread, quinoa, whole grain rice (1/2 cup), fish,  chicken, [place]. Avoid sugar and calorie containing beverages - soda,  sweet tea and juice.  Choose water or unsweetened tea instead.   Advanced Directives - UTD, patient to bring in copy  Physical Activity - discussed increasing water exercising and join [employer]  Follow up with PCP as planned 07/05/2016 - labs due then A1c, lipids, CMP, CBC with diff, B12, vitamin D and consider urine microalbumin    Patient Instructions (the written plan) were given to the patient.   Cherre Robins, [facility]   06/29/2016

## 2016-06-29 NOTE — Patient Instructions (Addendum)
[name] , Thank you for taking time to come for your Medicare Wellness Visit. I appreciate your ongoing commitment to your health goals. Please review the following plan we discussed and let me know if I can assist you in the future.   These are the goals we discussed:  Sent referral for colonoscopy - due every 10 years.  Look into Musician Program at [employer].  Continue to do water exercises / aerobics  Increase non-starchy vegetables - carrots, green bean, squash, zucchini, tomatoes, onions, peppers, spinach and other green leafy vegetables, cabbage, lettuce, cucumbers, asparagus, okra (not fried), eggplant Limit sugar and processed foods (cakes, cookies, ice cream, crackers and chips) Increase fresh fruit but limit serving sizes 1/2 cup or about the size of tennis or baseball Limit red meat to no more than 1-2 times per week (serving size about the size of your palm) Choose whole grains / lean proteins - whole wheat bread, quinoa, whole grain rice (1/2 cup), fish, chicken, [place] Avoid sugar and calorie containing beverages - soda, sweet tea and juice.  Choose water or unsweetened tea instead.    This is a list of the screening recommended for you and due dates:  Health Maintenance  Topic Date Due  . Pneumococcal vaccine (1) Done today  . HIV Screening  09/03/2016 - post pone  . Flu Shot   doesn't get flu vaccine  . Hemoglobin A1C  07/03/2016  . Complete foot exam   01/03/2017  . Eye exam for diabetics  03/21/2017  . Pap Smear  05/28/2018  . Tetanus Vaccine  07/08/2025  *Topic was postponed. The date shown is not the original due date.   Health Maintenance, Female Adopting a healthy lifestyle and getting preventive care can go a long way to promote health and wellness. Talk with your health care provider about what schedule of regular examinations is right for you. This is a good chance for you to check in with your provider about disease prevention and staying healthy. In  between checkups, there are plenty of things you can do on your own. Experts have done a lot of research about which lifestyle changes and preventive measures are most likely to keep you healthy. Ask your health care provider for more information. WEIGHT AND DIET  Eat a healthy diet  Be sure to include plenty of vegetables, fruits, low-fat dairy products, and lean protein.  Do not eat a lot of foods high in solid fats, added sugars, or salt.  Get regular exercise. This is one of the most important things you can do for your health.  Most adults should exercise for at least 150 minutes each week. The exercise should increase your heart rate and make you sweat (moderate-intensity exercise).  Most adults should also do strengthening exercises at least twice a week. This is in addition to the moderate-intensity exercise.  Maintain a healthy weight  Body mass index (BMI) is a measurement that can be used to identify possible weight problems. It estimates body fat based on height and weight. Your health care provider can help determine your BMI and help you achieve or maintain a healthy weight.  For females 41 years of age and older:   A BMI below 18.5 is considered underweight.  A BMI of 18.5 to 24.9 is normal.  A BMI of 25 to 29.9 is considered overweight.  A BMI of 30 and above is considered obese.  Watch levels of cholesterol and blood lipids  You should start having  your blood tested for lipids and cholesterol at 49 years of age, then have this test every 5 years.  You may need to have your cholesterol levels checked more often if:  Your lipid or cholesterol levels are high.  You are older than 49 years of age.  You are at high risk for heart disease.  CANCER SCREENING   Lung Cancer  Lung cancer screening is recommended for adults 14-32 years old who are at high risk for lung cancer because of a history of smoking.  A yearly low-dose CT scan of the lungs is recommended  for people who:  Currently smoke.  Have quit within the past 15 years.  Have at least a 30-pack-year history of smoking. A pack year is smoking an average of one pack of cigarettes a day for 1 year.  Yearly screening should continue until it has been 15 years since you quit.  Yearly screening should stop if you develop a health problem that would prevent you from having lung cancer treatment.  Breast Cancer  Practice breast self-awareness. This means understanding how your breasts normally appear and feel.  It also means doing regular breast self-exams. Let your health care provider know about any changes, no matter how small.  If you are in your 20s or 30s, you should have a clinical breast exam (CBE) by a health care provider every 1-3 years as part of a regular health exam.  If you are 73 or older, have a CBE every year. Also consider having a breast X-ray (mammogram) every year.  If you have a family history of breast cancer, talk to your health care provider about genetic screening.  If you are at high risk for breast cancer, talk to your health care provider about having an MRI and a mammogram every year.  Breast cancer gene (BRCA) assessment is recommended for women who have family members with BRCA-related cancers. BRCA-related cancers include:  Breast.  Ovarian.  Tubal.  Peritoneal cancers.  Results of the assessment will determine the need for genetic counseling and BRCA1 and BRCA2 testing. Cervical Cancer Your health care provider may recommend that you be screened regularly for cancer of the pelvic organs (ovaries, uterus, and vagina). This screening involves a pelvic examination, including checking for microscopic changes to the surface of your cervix (Pap test). You may be encouraged to have this screening done every 3 years, beginning at age 67.  For women ages 63-65, health care providers may recommend pelvic exams and Pap testing every 3 years, or they may  recommend the Pap and pelvic exam, combined with testing for human papilloma virus (HPV), every 5 years. Some types of HPV increase your risk of cervical cancer. Testing for HPV may also be done on women of any age with unclear Pap test results.  Other health care providers may not recommend any screening for nonpregnant women who are considered low risk for pelvic cancer and who do not have symptoms. Ask your health care provider if a screening pelvic exam is right for you.  If you have had past treatment for cervical cancer or a condition that could lead to cancer, you need Pap tests and screening for cancer for at least 20 years after your treatment. If Pap tests have been discontinued, your risk factors (such as having a new sexual partner) need to be reassessed to determine if screening should resume. Some women have medical problems that increase the chance of getting cervical cancer. In these cases, your health  care provider may recommend more frequent screening and Pap tests. Colorectal Cancer  This type of cancer can be detected and often prevented.  Routine colorectal cancer screening usually begins at 50 years of age and continues through 49 years of age.  Your health care provider may recommend screening at an earlier age if you have risk factors for colon cancer.  Your health care provider may also recommend using home test kits to check for hidden blood in the stool.  A small camera at the end of a tube can be used to examine your colon directly (sigmoidoscopy or colonoscopy). This is done to check for the earliest forms of colorectal cancer.  Routine screening usually begins at age 70.  Direct examination of the colon should be repeated every 5-10 years through 49 years of age. However, you may need to be screened more often if early forms of precancerous polyps or small growths are found. Skin Cancer  Check your skin from head to toe regularly.  Tell your health care provider  about any new moles or changes in moles, especially if there is a change in a mole's shape or color.  Also tell your health care provider if you have a mole that is larger than the size of a pencil eraser.  Always use sunscreen. Apply sunscreen liberally and repeatedly throughout the day.  Protect yourself by wearing long sleeves, pants, a wide-brimmed hat, and sunglasses whenever you are outside. HEART DISEASE, DIABETES, AND HIGH BLOOD PRESSURE   High blood pressure causes heart disease and increases the risk of stroke. High blood pressure is more likely to develop in:  People who have blood pressure in the high end of the normal range (130-139/85-89 mm Hg).  People who are overweight or obese.  People who are African American.  If you are 63-39 years of age, have your blood pressure checked every 3-5 years. If you are 2 years of age or older, have your blood pressure checked every year. You should have your blood pressure measured twice--once when you are at a hospital or clinic, and once when you are not at a hospital or clinic. Record the average of the two measurements. To check your blood pressure when you are not at a hospital or clinic, you can use:  An automated blood pressure machine at a pharmacy.  A home blood pressure monitor.  If you are between 21 years and 51 years old, ask your health care provider if you should take aspirin to prevent strokes.  Have regular diabetes screenings. This involves taking a blood sample to check your fasting blood sugar level.  If you are at a normal weight and have a low risk for diabetes, have this test once every three years after 49 years of age.  If you are overweight and have a high risk for diabetes, consider being tested at a younger age or more often. PREVENTING INFECTION  Hepatitis B  If you have a higher risk for hepatitis B, you should be screened for this virus. You are considered at high risk for hepatitis B if:  You were  born in a country where hepatitis B is common. Ask your health care provider which countries are considered high risk.  Your parents were born in a high-risk country, and you have not been immunized against hepatitis B (hepatitis B vaccine).  You have HIV or AIDS.  You use needles to inject street drugs.  You live with someone who has hepatitis B.  You  have had sex with someone who has hepatitis B.  You get hemodialysis treatment.  You take certain medicines for conditions, including cancer, organ transplantation, and autoimmune conditions. Hepatitis C  Blood testing is recommended for:  Everyone born from [birth year] through 1964.  Anyone with known risk factors for hepatitis C. Sexually transmitted infections (STIs)  You should be screened for sexually transmitted infections (STIs) including gonorrhea and chlamydia if:  You are sexually active and are younger than 50 years of age.  You are older than 49 years of age and your health care provider tells you that you are at risk for this type of infection.  Your sexual activity has changed since you were last screened and you are at an increased risk for chlamydia or gonorrhea. Ask your health care provider if you are at risk.  If you do not have HIV, but are at risk, it may be recommended that you take a prescription medicine daily to prevent HIV infection. This is called pre-exposure prophylaxis (PrEP). You are considered at risk if:  You are sexually active and do not regularly use condoms or know the HIV status of your partner(s).  You take drugs by injection.  You are sexually active with a partner who has HIV. Talk with your health care provider about whether you are at high risk of being infected with HIV. If you choose to begin PrEP, you should first be tested for HIV. You should then be tested every 3 months for as long as you are taking PrEP.  PREGNANCY   If you are premenopausal and you may become pregnant, ask your  health care provider about preconception counseling.  If you may become pregnant, take 400 to 800 micrograms (mcg) of folic acid every day.  If you want to prevent pregnancy, talk to your health care provider about birth control (contraception). OSTEOPOROSIS AND MENOPAUSE   Osteoporosis is a disease in which the bones lose minerals and strength with aging. This can result in serious bone fractures. Your risk for osteoporosis can be identified using a bone density scan.  If you are 79 years of age or older, or if you are at risk for osteoporosis and fractures, ask your health care provider if you should be screened.  Ask your health care provider whether you should take a calcium or vitamin D supplement to lower your risk for osteoporosis.  Menopause may have certain physical symptoms and risks.  Hormone replacement therapy may reduce some of these symptoms and risks. Talk to your health care provider about whether hormone replacement therapy is right for you.  HOME CARE INSTRUCTIONS   Schedule regular health, dental, and eye exams.  Stay current with your immunizations.   Do not use any tobacco products including cigarettes, chewing tobacco, or electronic cigarettes.  If you are pregnant, do not drink alcohol.  If you are breastfeeding, limit how much and how often you drink alcohol.  Limit alcohol intake to no more than 1 drink per day for nonpregnant women. One drink equals 12 ounces of beer, 5 ounces of wine, or 1 ounces of hard liquor.  Do not use street drugs.  Do not share needles.  Ask your health care provider for help if you need support or information about quitting drugs.  Tell your health care provider if you often feel depressed.  Tell your health care provider if you have ever been abused or do not feel safe at home.   This information is not intended to  replace advice given to you by your health care provider. Make sure you discuss any questions you have with  your health care provider.   Document Released: 06/13/2011 Document Revised: 12/19/2014 Document Reviewed: 10/30/2013 Elsevier Interactive Patient Education [employer].

## 2016-07-05 ENCOUNTER — Ambulatory Visit (INDEPENDENT_AMBULATORY_CARE_PROVIDER_SITE_OTHER): Payer: Medicare Other | Admitting: Family Medicine

## 2016-07-05 ENCOUNTER — Encounter: Payer: Self-pay | Admitting: Family Medicine

## 2016-07-05 VITALS — BP 138/66 | HR 58 | Temp 97.0°F | Ht 64.5 in | Wt 310.0 lb

## 2016-07-05 DIAGNOSIS — E039 Hypothyroidism, unspecified: Secondary | ICD-10-CM

## 2016-07-05 DIAGNOSIS — E119 Type 2 diabetes mellitus without complications: Secondary | ICD-10-CM | POA: Diagnosis not present

## 2016-07-05 DIAGNOSIS — E785 Hyperlipidemia, unspecified: Secondary | ICD-10-CM | POA: Diagnosis not present

## 2016-07-05 DIAGNOSIS — I10 Essential (primary) hypertension: Secondary | ICD-10-CM | POA: Diagnosis not present

## 2016-07-05 LAB — BAYER DCA HB A1C WAIVED: HB A1C: 7.2 % — AB (ref <7.0)

## 2016-07-05 MED ORDER — VALSARTAN-HYDROCHLOROTHIAZIDE 160-25 MG PO TABS: 1.0000 | ORAL_TABLET | Freq: Every day | ORAL | 6 refills | Status: DC

## 2016-07-05 MED ORDER — LINACLOTIDE 145 MCG PO CAPS: ORAL_CAPSULE | ORAL | 6 refills | Status: DC

## 2016-07-05 MED ORDER — POTASSIUM CHLORIDE ER 10 MEQ PO TBCR: 10.0000 meq | EXTENDED_RELEASE_TABLET | Freq: Every day | ORAL | 6 refills | Status: DC

## 2016-07-05 MED ORDER — METFORMIN HCL 500 MG PO TABS: 500.0000 mg | ORAL_TABLET | Freq: Every evening | ORAL | 6 refills | Status: DC

## 2016-07-05 MED ORDER — GLUCOSE BLOOD VI STRP: ORAL_STRIP | 3 refills | Status: DC

## 2016-07-05 MED ORDER — FENOFIBRATE MICRONIZED 134 MG PO CAPS: ORAL_CAPSULE | ORAL | 6 refills | Status: DC

## 2016-07-05 MED ORDER — FUROSEMIDE 20 MG PO TABS: 20.0000 mg | ORAL_TABLET | Freq: Every day | ORAL | 6 refills | Status: DC

## 2016-07-05 MED ORDER — ZIPRASIDONE HCL 60 MG PO CAPS: ORAL_CAPSULE | ORAL | 6 refills | Status: DC

## 2016-07-05 MED ORDER — SERTRALINE HCL 100 MG PO TABS: 200.0000 mg | ORAL_TABLET | Freq: Every day | ORAL | 6 refills | Status: DC

## 2016-07-05 MED ORDER — LEVOTHYROXINE SODIUM 150 MCG PO TABS: 150.0000 ug | ORAL_TABLET | Freq: Every day | ORAL | 6 refills | Status: DC

## 2016-07-05 MED ORDER — OMEPRAZOLE 40 MG PO CPDR: DELAYED_RELEASE_CAPSULE | ORAL | 6 refills | Status: DC

## 2016-07-05 NOTE — Progress Notes (Signed)
Subjective:    Patient ID: Morgan [name], female    DOB: 1966-04-10, 50 y.o.   MRN: [id number]  HPI 50 year old female with morbid obesity with BMI at 53.2, diabetes, hypertension, hyperlipidemia, and obstructive sleep apnea. She also has migraine headaches and these are being managed by neurology. She brings with her today some forms from the [facility] surgery office and she is considering bariatric surgery. Nodes are to include weights from the past 5 years weight loss attempts including medicines diets etc. This will require some research before completing  Patient Active Problem List   Diagnosis Date Noted  . Intractable chronic migraine without aura 06/04/2015  . Pancreatitis 02/27/2014  . Fatty liver disease, nonalcoholic 54/98/2641  . Bipolar disorder ([place]) 02/20/2014  . Metabolic syndrome 58/30/9407  . Vitamin D deficiency   . Morbid obesity ([place]) 03/12/2013  . DM (diabetes mellitus) ([place]) 03/12/2013  . HLD (hyperlipidemia) 03/12/2013  . Preoperative evaluation to rule out surgical contraindication 06/12/2011  . Hypothyroidism   . Constipation, chronic   . Vitamin B 12 deficiency   . ALLERGIC RHINITIS 12/31/2010  . BARRETTS ESOPHAGUS 12/31/2010  . Gastroparesis 12/31/2010  . OSTEOARTHRITIS 12/31/2010  . Obstructive sleep apnea 09/27/2010  . Migraine 09/27/2010  . Essential hypertension 09/27/2010  . RESPIRATORY FAILURE, CHRONIC 09/27/2010   Outpatient Encounter Prescriptions as of 07/05/2016  Medication Sig  . Cholecalciferol (VITAMIN D) 2000 UNITS CAPS Take 2,000 Units by mouth daily.  . diclofenac sodium (VOLTAREN) 1 % GEL Use as directed for knee pain  . ezetimibe (ZETIA) 10 MG tablet Take 1 tablet (10 mg total) by mouth daily.  . fenofibrate micronized (LOFIBRA) 134 MG capsule TAKE (1) CAPSULE DAILY BEFORE BREAKFAST.  . ferrous gluconate (FERGON) 325 MG tablet Take 325 mg by mouth daily with breakfast. Reported on 06/29/2016  . fluticasone (FLONASE) 50  MCG/ACT nasal spray SPRAY 1 SPRAY IN EACH NOSTRIL TWICE DAILY. SHAKE GENTLY BEFORE EACH USE.  . furosemide (LASIX) 20 MG tablet Take 1 tablet (20 mg total) by mouth daily.  Marland Kitchen gabapentin (NEURONTIN) 300 MG capsule One capsule twice a day for 2 weeks, then take one capsule in the morning and two in the evening (Patient taking differently: Take 300 mg by mouth. one capsule in the morning and two in the evening)  . glucose blood (ONE TOUCH ULTRA TEST) test strip CHECK BLOOD SUGAR TWICE A DAY AS DIRECTED  . levothyroxine (SYNTHROID, LEVOTHROID) 150 MCG tablet Take 1 tablet (150 mcg total) by mouth daily.  Marland Kitchen linaclotide (LINZESS) 145 MCG CAPS capsule TAKE (1) CAPSULE DAILY  . loratadine (CLARITIN) 10 MG tablet Take 10 mg by mouth daily as needed. Prn   . metFORMIN (GLUCOPHAGE) 500 MG tablet Take 1 tablet (500 mg total) by mouth every evening.  . Multiple Vitamin (MULTIVITAMIN) capsule Take 1 capsule by mouth daily.    Marland Kitchen nystatin (MYCOSTATIN) powder Apply topically 2 (two) times daily as needed.    Marland Kitchen omeprazole (PRILOSEC) 40 MG capsule TAKE (1) CAPSULE DAILY  . PAZEO 0.7 % SOLN Place 1 drop into both eyes every morning.  . potassium chloride (K-DUR) 10 MEQ tablet Take 1 tablet (10 mEq total) by mouth daily.  . Probiotic Product (HEALTHY COLON PO) Take 2 capsules by mouth daily.   . propranolol (INDERAL) 60 MG tablet TAKE (1) TABLET TWICE A DAY.  Marland Kitchen RESTASIS MULTIDOSE 0.05 % ophthalmic emulsion Place 1 drop into both eyes 2 (two) times daily.  . sertraline (ZOLOFT) 100 MG tablet Take  2 tablets (200 mg total) by mouth daily.  . SUMAtriptan (IMITREX) 6 MG/0.5ML SOSY injection Inject 0.5 mLs (6 mg total) into the skin every 2 (two) hours as needed for migraine or headache.  Marland Kitchen tiZANidine (ZANAFLEX) 2 MG tablet Take 1 tablet (2 mg total) by mouth 3 (three) times daily.  . traMADol (ULTRAM) 50 MG tablet Take 50 mg by mouth 2 (two) times daily.   . valsartan-hydrochlorothiazide (DIOVAN-HCT) 160-25 MG tablet  Take 1 tablet by mouth daily.  . ziprasidone (GEODON) 60 MG capsule TAKE 1 CAPSULE IN THE EVENING  . [DISCONTINUED] fenofibrate micronized (LOFIBRA) 134 MG capsule TAKE (1) CAPSULE DAILY BEFORE BREAKFAST.  . [DISCONTINUED] furosemide (LASIX) 20 MG tablet Take 1 tablet (20 mg total) by mouth daily.  . [DISCONTINUED] levothyroxine (SYNTHROID, LEVOTHROID) 150 MCG tablet Take 1 tablet (150 mcg total) by mouth daily.  . [DISCONTINUED] Linaclotide (LINZESS) 145 MCG CAPS capsule TAKE (1) CAPSULE DAILY  . [DISCONTINUED] metFORMIN (GLUCOPHAGE) 500 MG tablet Take 1 tablet (500 mg total) by mouth every evening.  . [DISCONTINUED] omeprazole (PRILOSEC) 40 MG capsule TAKE (1) CAPSULE DAILY  . [DISCONTINUED] ONE TOUCH ULTRA TEST test strip CHECK BLOOD SUGAR TWICE A DAY AS DIRECTED  . [DISCONTINUED] potassium chloride (K-DUR) 10 MEQ tablet Take 1 tablet (10 mEq total) by mouth daily.  . [DISCONTINUED] sertraline (ZOLOFT) 100 MG tablet Take 2 tablets (200 mg total) by mouth daily.  . [DISCONTINUED] valsartan-hydrochlorothiazide (DIOVAN-HCT) 160-25 MG tablet Take 1 tablet by mouth daily.  . [DISCONTINUED] ziprasidone (GEODON) 60 MG capsule TAKE 1 CAPSULE IN THE EVENING   Facility-Administered Encounter Medications as of 07/05/2016  Medication  . cyanocobalamin ((VITAMIN B-12)) injection 1,000 mcg      Review of Systems  Constitutional: Negative.   Respiratory: Negative.   Cardiovascular: Negative.   Neurological: Positive for headaches.  Psychiatric/Behavioral: Negative.        Objective:   Physical Exam  Constitutional: She is oriented to person, place, and time. She appears well-developed and well-nourished.  HENT:  Head: Normocephalic.  Cardiovascular: Normal rate, regular rhythm and normal heart sounds.   Pulmonary/Chest: Effort normal and breath sounds normal.  Abdominal: Soft.  Neurological: She is alert and oriented to person, place, and time.  Psychiatric: She has a normal mood and affect.  Her behavior is normal.   BP 138/66 (BP Location: Left Arm, Patient Position: Sitting, Cuff Size: Large)   Pulse (!) 58   Temp 97 F (36.1 C) (Oral)   Ht 5' 4.5" (1.638 m)   Wt (!) 310 lb (140.6 kg)   BMI 52.39 kg/m         Assessment & Plan:  1. Type 2 diabetes mellitus without complication, without long-term current use of insulin (HCC) Last A1c was 7.07 months ago. - Bayer DCA Hb A1c Waived - Microalbumin / creatinine urine ratio  2. Essential hypertension Pressure is well controlled today at 138/66. - CMP14+EGFR - Vitamin B12  3. Hypothyroidism, unspecified hypothyroidism type Last TSH was done one year ago at 4.4 - Lipid panel - Thyroid Panel With TSH - VITAMIN D 25 Hydroxy (Vit-D Deficiency, Fractures)  4. HLD (hyperlipidemia) She takes Zetia and fenofibrate.  Wardell Honour MD

## 2016-07-06 LAB — CMP14+EGFR
A/G RATIO: 1.7 (ref 1.2–2.2)
ALBUMIN: 4.3 g/dL (ref 3.5–5.5)
ALK PHOS: 68 IU/L (ref 39–117)
ALT: 40 IU/L — ABNORMAL HIGH (ref 0–32)
AST: 42 IU/L — ABNORMAL HIGH (ref 0–40)
BILIRUBIN TOTAL: 0.3 mg/dL (ref 0.0–1.2)
BUN / CREAT RATIO: 26 — AB (ref 9–23)
BUN: 23 mg/dL (ref 6–24)
CHLORIDE: 100 mmol/L (ref 96–106)
CO2: 27 mmol/L (ref 18–29)
CREATININE: 0.88 mg/dL (ref 0.57–1.00)
Calcium: 9.6 mg/dL (ref 8.7–10.2)
GFR calc Af Amer: 90 mL/min/{1.73_m2} (ref >59)
GFR calc non Af Amer: 78 mL/min/{1.73_m2} (ref >59)
GLOBULIN, TOTAL: 2.5 g/dL (ref 1.5–4.5)
Glucose: 130 mg/dL — ABNORMAL HIGH (ref 65–99)
POTASSIUM: 4 mmol/L (ref 3.5–5.2)
SODIUM: 144 mmol/L (ref 134–144)
Total Protein: 6.8 g/dL (ref 6.0–8.5)

## 2016-07-06 LAB — THYROID PANEL WITH TSH
Free Thyroxine Index: 2.5 (ref 1.2–4.9)
T3 Uptake Ratio: 26 % (ref 24–39)
T4, Total: 9.6 ug/dL (ref 4.5–12.0)
TSH: 2.33 u[IU]/mL (ref 0.450–4.500)

## 2016-07-06 LAB — MICROALBUMIN / CREATININE URINE RATIO
Creatinine, Urine: 235.9 mg/dL
MICROALB/CREAT RATIO: 6.7 mg/g creat (ref 0.0–30.0)
Microalbumin, Urine: 15.9 ug/mL

## 2016-07-06 LAB — LIPID PANEL
CHOLESTEROL TOTAL: 179 mg/dL (ref 100–199)
Chol/HDL Ratio: 5.6 ratio units — ABNORMAL HIGH (ref 0.0–4.4)
HDL: 32 mg/dL — AB (ref >39)
TRIGLYCERIDES: 450 mg/dL — AB (ref 0–149)

## 2016-07-06 LAB — VITAMIN D 25 HYDROXY (VIT D DEFICIENCY, FRACTURES): Vit D, 25-Hydroxy: 44.1 ng/mL (ref 30.0–100.0)

## 2016-07-06 LAB — VITAMIN B12: Vitamin B-12: 1140 pg/mL — ABNORMAL HIGH (ref 211–946)

## 2016-07-12 ENCOUNTER — Telehealth: Payer: Self-pay | Admitting: Family Medicine

## 2016-07-13 NOTE — Telephone Encounter (Signed)
Pt needed appt for weight management - appt given.  Also will write letter to [facility] [place] [facility] for bariatric surgery program enrollment and fax to them one Dr Sabra Heck has opportunity to sign.

## 2016-07-14 ENCOUNTER — Telehealth: Payer: Self-pay | Admitting: Family Medicine

## 2016-07-14 DIAGNOSIS — E669 Obesity, unspecified: Secondary | ICD-10-CM | POA: Diagnosis not present

## 2016-07-14 DIAGNOSIS — E1169 Type 2 diabetes mellitus with other specified complication: Secondary | ICD-10-CM | POA: Diagnosis not present

## 2016-07-14 DIAGNOSIS — K219 Gastro-esophageal reflux disease without esophagitis: Secondary | ICD-10-CM | POA: Diagnosis not present

## 2016-07-14 DIAGNOSIS — Z6841 Body Mass Index (BMI) 40.0 and over, adult: Secondary | ICD-10-CM | POA: Diagnosis not present

## 2016-07-14 NOTE — Telephone Encounter (Signed)
Patient aware of lab results.

## 2016-07-20 ENCOUNTER — Encounter: Payer: Medicare Other | Attending: General Surgery | Admitting: Dietician

## 2016-07-20 ENCOUNTER — Encounter: Payer: Self-pay | Admitting: Dietician

## 2016-07-20 DIAGNOSIS — E669 Obesity, unspecified: Secondary | ICD-10-CM | POA: Diagnosis not present

## 2016-07-20 DIAGNOSIS — I10 Essential (primary) hypertension: Secondary | ICD-10-CM | POA: Insufficient documentation

## 2016-07-20 DIAGNOSIS — E119 Type 2 diabetes mellitus without complications: Secondary | ICD-10-CM | POA: Insufficient documentation

## 2016-07-20 DIAGNOSIS — Z713 Dietary counseling and surveillance: Secondary | ICD-10-CM | POA: Insufficient documentation

## 2016-07-20 NOTE — Patient Instructions (Signed)
Follow Pre-Op Goals Try Protein Shakes Call [facility] at [phone] when surgery is scheduled to enroll in Pre-Op Class  Things to remember:  Please always be honest with us. We want to support you!  If you have any questions or concerns in between appointments, please call or email Liz, Leslie, or Laurie.  The diet after surgery will be high protein and low in carbohydrate.  Vitamins and calcium need to be taken for the rest of your life.  Feel free to include support people in any classes or appointments.   Supplement recommendations:  Complete" Multivitamin: Sleeve Gastrectomy and RYGB patients take a double dose of MVI. LAGB patients take single dose as it is written on the package. Vitamin must be liquid or chewable but not gummy. Examples of these include Flintstones Complete and Centrum Complete. If the vitamin is bariatric-specific, take 1 dose as it is already formulated for bariatric surgery patients. Examples of these are Bariatric Advantage, Celebrate, and Wellesse. These can be found at the [facility] and/or online.     Calcium citrate: 1500 mg/day of Calcium citrate (also chewable or liquid) is recommended for all procedures. The body is only able to absorb 500-600 mg of Calcium at one time so 3 daily doses of 500 mg are recommended. Calcium doses must be taken a minimum of 2 hours apart. Additionally, Calcium must be taken 2 hours apart from iron-containing MVI. Examples of brands include Celebrate, Bariatric Advantage, and Wellesse. These brands must be purchased online or at the [facility]. Citracal Petites is the only Calcium citrate supplement found in general grocery stores and pharmacies. This is in tablet form and may be recommended for patients who do not tolerate chewable Calcium.  Continued or added Vitamin D supplementation based on individual needs.    Vitamin B12: 300-500 mcg/day for Sleeve Gastrectomy and RYGB. Optional for  LAGB patients as stomach remains fully intact. Must be taken intramuscularly, sublingually, or inhaled nasally. Oral route is not recommended. 

## 2016-07-20 NOTE — Progress Notes (Signed)
  Pre-Op Assessment Visit:  Pre-Operative RYGB Surgery  Medical Nutrition Therapy:  Appt start time: [id number]   End time:  1000.  Patient was seen on 07/20/2016 for Pre-Operative Nutrition Assessment. Assessment and letter of approval faxed to [facility] Bariatric [facility] Program coordinator on 07/20/2016.   Preferred Learning Style:   No preference indicated   Learning Readiness:   Ready  Handouts given during visit include:  Pre-Op Goals Bariatric Surgery Protein Shakes   During the appointment today the following Pre-Op Goals were reviewed with the patient: Maintain or lose weight as instructed by your surgeon Make healthy food choices Begin to limit portion sizes Limited concentrated sugars and fried foods Keep fat/sugar in the single digits per serving on   food labels Practice CHEWING your food  (aim for 30 chews per bite or until applesauce consistency) Practice not drinking 15 minutes before, during, and 30 minutes after each meal/snack Avoid all carbonated beverages  Avoid/limit caffeinated beverages  Avoid all sugar-sweetened beverages Consume 3 meals per day; eat every 3-5 hours Make a list of non-food related activities Aim for 64-100 ounces of FLUID daily  Aim for at least 60-80 grams of PROTEIN daily Look for a liquid protein source that contain ?15 g protein and ?5 g carbohydrate  (ex: shakes, drinks, shots)  Patient-Centered Goals: Have knee surgery, be more active with nieces and nephews  8 confidence/10 importance  Demonstrated degree of understanding via:  Teach Back  Teaching Method Utilized:  Visual Auditory Hands on  Barriers to learning/adherence to lifestyle change: none  [facility] to call the [facility] to enroll in Pre-Op and Post-Op Nutrition Education when surgery date is scheduled.

## 2016-07-26 ENCOUNTER — Ambulatory Visit (INDEPENDENT_AMBULATORY_CARE_PROVIDER_SITE_OTHER): Payer: Medicare Other | Admitting: *Deleted

## 2016-07-26 ENCOUNTER — Ambulatory Visit: Payer: Medicare Other

## 2016-07-26 ENCOUNTER — Encounter: Payer: Self-pay | Admitting: Pharmacist

## 2016-07-26 ENCOUNTER — Ambulatory Visit (INDEPENDENT_AMBULATORY_CARE_PROVIDER_SITE_OTHER): Payer: Medicare Other | Admitting: Pharmacist

## 2016-07-26 DIAGNOSIS — E661 Drug-induced obesity: Secondary | ICD-10-CM

## 2016-07-26 DIAGNOSIS — E538 Deficiency of other specified B group vitamins: Secondary | ICD-10-CM

## 2016-07-26 NOTE — Patient Instructions (Signed)

## 2016-07-26 NOTE — Progress Notes (Signed)
Vitamin b12 injection given and patient tolerated well.  

## 2016-07-26 NOTE — Patient Instructions (Signed)
Goal is to decrease weight by 10% or 30# over next 3 to 6 months.   Start to record each meal and snack with food amounts Goal calorie intake per day is 1200 to 1500 calories per day.  My Fitness Pal is a good app to use on phone to track calories and exercise.   Continue to do water aerobics - goal is at least 150 minutes of exercise per week.    Increase non-starchy vegetables - carrots, green bean, squash, zucchini, tomatoes, onions, peppers, spinach and other green leafy vegetables, cabbage, lettuce, cucumbers, asparagus, okra (not fried), eggplant  Limit sugar and processed foods (cakes, cookies, ice cream, crackers and chips)  Increase fresh fruit but limit serving sizes 1/2 cup or about the size of tennis or baseball  Limit red meat to no more than 1-2 times per week (serving size about the size of your palm)  Choose whole grains / lean proteins - whole wheat bread, quinoa, whole grain rice (1/2 cup), fish, chicken, [place] Avoid fried foods  Avoid sugar and calorie containing beverages - soda, sweet tea and juice.  Choose water or unsweetened, decaf tea instead.

## 2016-07-28 NOTE — Progress Notes (Signed)
Patient ID: Morgan [name], female   DOB: 11-Apr-1966, 50 y.o.   MRN: [id number]  Subjective:     Morgan [name] is a 50 y.o. female who I am asked to see in consultation with Dr Alain Honey for evaluation and treatment of obesity. Morgan [name] is interested in bariatric surgery. She has met with surgeon at [facility] and also nutritionist.  She is requried by her insurance to complete 6 months physician supervised weight loss visits.    Patient cites health, increased physical ability, self-image as reasons for wanting to lose weight.   History of Weight Loss Efforts Greatest amount of weight lost: 150 lbs over 10 months Amount of time that loss was maintained: 4 months Circumstances associated with regain of weight: stopped phentermine Successful weight loss techniques attempted: prescription appetite suppressants: phentermine and supervised diet program Unsuccessful weight loss techniques attempted: self-directed dieting and vegan diet  Current Exercise Habits water aerobics started about 2 weeks ago - goes for 60 minutes MWF  Current Outpatient Prescriptions on File Prior to Visit  Medication Sig Dispense Refill  . Cholecalciferol (VITAMIN D) 2000 UNITS CAPS Take 2,000 Units by mouth daily.    . diclofenac sodium (VOLTAREN) 1 % GEL Use as directed for knee pain    . ezetimibe (ZETIA) 10 MG tablet Take 1 tablet (10 mg total) by mouth daily. 30 tablet 5  . fenofibrate micronized (LOFIBRA) 134 MG capsule TAKE (1) CAPSULE DAILY BEFORE BREAKFAST. 30 capsule 6  . ferrous gluconate (FERGON) 325 MG tablet Take 325 mg by mouth daily with breakfast. Reported on 06/29/2016    . fluticasone (FLONASE) 50 MCG/ACT nasal spray SPRAY 1 SPRAY IN EACH NOSTRIL TWICE DAILY. SHAKE GENTLY BEFORE EACH USE. 16 g 3  . furosemide (LASIX) 20 MG tablet Take 1 tablet (20 mg total) by mouth daily. 30 tablet 6  . gabapentin (NEURONTIN) 300 MG capsule One capsule twice a day for 2 weeks, then take one  capsule in the morning and two in the evening (Patient taking differently: Take 300 mg by mouth. one capsule in the morning and two in the evening) 90 capsule 3  . glucose blood (ONE TOUCH ULTRA TEST) test strip CHECK BLOOD SUGAR TWICE A DAY AS DIRECTED 100 each 3  . levothyroxine (SYNTHROID, LEVOTHROID) 150 MCG tablet Take 1 tablet (150 mcg total) by mouth daily. 30 tablet 6  . linaclotide (LINZESS) 145 MCG CAPS capsule TAKE (1) CAPSULE DAILY 30 capsule 6  . loratadine (CLARITIN) 10 MG tablet Take 10 mg by mouth daily as needed. Prn     . metFORMIN (GLUCOPHAGE) 500 MG tablet Take 1 tablet (500 mg total) by mouth every evening. 30 tablet 6  . Multiple Vitamin (MULTIVITAMIN) capsule Take 1 capsule by mouth daily.      Marland Kitchen nystatin (MYCOSTATIN) powder Apply topically 2 (two) times daily as needed.      Marland Kitchen omeprazole (PRILOSEC) 40 MG capsule TAKE (1) CAPSULE DAILY 30 capsule 6  . PAZEO 0.7 % SOLN Place 1 drop into both eyes every morning.    . potassium chloride (K-DUR) 10 MEQ tablet Take 1 tablet (10 mEq total) by mouth daily. 30 tablet 6  . Probiotic Product (HEALTHY COLON PO) Take 2 capsules by mouth daily.     . propranolol (INDERAL) 60 MG tablet TAKE (1) TABLET TWICE A DAY. 60 tablet 5  . RESTASIS MULTIDOSE 0.05 % ophthalmic emulsion Place 1 drop into both eyes 2 (two) times daily.    Marland Kitchen  sertraline (ZOLOFT) 100 MG tablet Take 2 tablets (200 mg total) by mouth daily. 60 tablet 6  . SUMAtriptan (IMITREX) 6 MG/0.5ML SOSY injection Inject 0.5 mLs (6 mg total) into the skin every 2 (two) hours as needed for migraine or headache. 8 vial 5  . tiZANidine (ZANAFLEX) 2 MG tablet Take 1 tablet (2 mg total) by mouth 3 (three) times daily. 90 tablet 5  . traMADol (ULTRAM) 50 MG tablet Take 50 mg by mouth 2 (two) times daily.     . valsartan-hydrochlorothiazide (DIOVAN-HCT) 160-25 MG tablet Take 1 tablet by mouth daily. 30 tablet 6  . ziprasidone (GEODON) 60 MG capsule TAKE 1 CAPSULE IN THE EVENING 30 capsule 6    Current Facility-Administered Medications on File Prior to Visit  Medication Dose Route Frequency Provider Last Rate Last Dose  . cyanocobalamin ((VITAMIN B-12)) injection 1,000 mcg  1,000 mcg Intramuscular Q30 days Wardell Honour, MD   1,000 mcg at 07/26/16 1359    Current Eating Habits Number of regular meals per day: 3 Number of snacking episodes per day: 1 Who shops for food? patient Who prepares food? patient Who eats with patient? patient Binge behavior?: yes -   Purge behavior? no Anorexic behavior? no Eating precipitated by stress? yes -   Guilt feelings associated with eating? yes -    Other Potential Contributing Factors Use of alcohol: average 0 drinks/week Use of medications that may cause weight gain antipsychotics (ziprasidone / Geodon) and SSRI   Psych History: bipolar Comorbidities: diabetes mellitus, dyslipidemias, GERD, hypertension, osteoarthritis, sleep apnea/hypopnea and NASH The following portions of the patient's history were reviewed and updated as appropriate: allergies, current medications, past family history, past medical history, past social history, past surgical history and problem list.    Objective:    BP 132/80   Pulse 78   Ht 5' 4.5" (1.638 m)   Wt (!) 309 lb (140.2 kg)   BMI 52.22 kg/m  Body mass index is 52.22 kg/m.  Lab Review  Last A1c = 7.2% (07/05/2016) AST = 42 (07/05/2016) ALT = 40 (07/05/2016)  Last Lipid Panel (07/05/2016) Tg = 450 HDL = 32 LDL = not able to calculate    Assessment:    Obesity with BMI and comorbidities as noted above. Contraindications to weight loss: none Patient readiness to commit to diet and activity changes: excellent Barriers to weight loss: limited income and stress      Plan:  1.  Discussed available diabtes treatments that can also help with weight loss - GLP-1 Agonist and SLGT2 agents.  Patient declined to start today (concerned about side effects) but information was given for her  to review.   2. General patient education ('Yes' if discussed, 'No' if not) Average sustained weight loss in long-term studies w/lifestyle interventions alone is 10-15lb: yes Importance of long-term maintenance tx in weight loss: yes Use non-food self-rewards to reinforce behavior changes: yes Elicit support from others; identify saboteurs: yes Initial target weight loss is 10% of weight.  Patient's goal is to lose 30lbs over the next 6 months.  3. Diet interventions: low calorie (1000 kCal/d) deficit diet Risks of dieting were reviewed, including fatigue, temporary hair loss, gallstone formation, gout, and with very low calorie diets, electrolyte abnormalities, nutrient inadequacies, and loss of lean body mass. Proper food choices reviewed: yes Preparation techniques reviewed: yes Careful meal planning; avoiding ad hoc eating: yes Stimulus control to control unhealthy eating: yes Handouts given: samples diet given.    4. Exercise  intervention:  Informal measures, e.g. taking stairs instead of elevator: yes Formal exercise regimen: yes - patient to also add weight training to water aerobics 5. Other behavioral treatment: Discussed trigger and trigger avoidiance.   6. Patient to keep a food and exercise diary either on phone with My Fitness pal or in notebook. that we will review at follow up. 7. Follow up: 4 weeks and as needed.    Above information was reviewed and discussed with patient's PCP - Dr Alain Honey

## 2016-07-29 DIAGNOSIS — G4733 Obstructive sleep apnea (adult) (pediatric): Secondary | ICD-10-CM | POA: Diagnosis not present

## 2016-07-29 DIAGNOSIS — R0902 Hypoxemia: Secondary | ICD-10-CM | POA: Diagnosis not present

## 2016-08-04 ENCOUNTER — Telehealth: Payer: Self-pay | Admitting: Cardiovascular Disease

## 2016-08-04 NOTE — Telephone Encounter (Signed)
Received records from [facility] for appointment with Dr Oval Linsey on 08/31/16.  Records given to [facility] (medical records) for Dr Blenda Mounts schedule on 08/31/16. lp

## 2016-08-26 ENCOUNTER — Ambulatory Visit: Payer: Medicare Other

## 2016-08-29 ENCOUNTER — Ambulatory Visit: Payer: Self-pay

## 2016-08-29 ENCOUNTER — Encounter: Payer: Self-pay | Admitting: Pharmacist

## 2016-08-29 ENCOUNTER — Ambulatory Visit (INDEPENDENT_AMBULATORY_CARE_PROVIDER_SITE_OTHER): Payer: Medicare Other | Admitting: *Deleted

## 2016-08-29 ENCOUNTER — Ambulatory Visit (INDEPENDENT_AMBULATORY_CARE_PROVIDER_SITE_OTHER): Payer: Medicare Other | Admitting: Pharmacist

## 2016-08-29 VITALS — BP 112/72 | HR 60 | Ht 65.0 in | Wt 304.0 lb

## 2016-08-29 DIAGNOSIS — E669 Obesity, unspecified: Secondary | ICD-10-CM | POA: Diagnosis not present

## 2016-08-29 DIAGNOSIS — E119 Type 2 diabetes mellitus without complications: Secondary | ICD-10-CM

## 2016-08-29 DIAGNOSIS — R0902 Hypoxemia: Secondary | ICD-10-CM | POA: Diagnosis not present

## 2016-08-29 DIAGNOSIS — G4733 Obstructive sleep apnea (adult) (pediatric): Secondary | ICD-10-CM | POA: Diagnosis not present

## 2016-08-29 DIAGNOSIS — E538 Deficiency of other specified B group vitamins: Secondary | ICD-10-CM

## 2016-08-29 NOTE — Progress Notes (Signed)
Patient ID: Morgan [name], female   DOB: 1966/04/21, 50 y.o.   MRN: [id number]  Subjective:     Morgan [name] is a 50 y.o. female who I am asked to see in consultation with Dr Alain Honey for evaluation and treatment of obesity. [name] is interested in bariatric surgery. She has met with surgeon at [facility] and also nutritionist.  She is requried by her insurance to complete 6 months physician supervised weight loss visits.  This is her second visit with me and she saw Dr Sabra Heck regarding weight July 2017.  Patient cites health, increased physical ability, self-image as reasons for wanting to lose weight.   History of Weight Loss Efforts Greatest amount of weight lost: 150 lbs over 10 months Amount of time that loss was maintained: 4 months Circumstances associated with regain of weight: stopped phentermine Successful weight loss techniques attempted: prescription appetite suppressants: phentermine and supervised diet program Unsuccessful weight loss techniques attempted: self-directed dieting and vegan diet  Current Eating Habits Number of regular meals per day: 3 Number of snacking episodes per day: 1 Patient is keeping food journal.  She is keeping meals small, though she still eats a lot of bread, breaded foods and fried foods.  She has improved greatly.  She is choosing low fat dairy products but still chooses some meats such as liver pudding and bologna which are high in fat.  Who shops for food? patient Who prepares food? patient Who eats with patient? patient Binge behavior?: yes -   Purge behavior? no Anorexic behavior? no Eating precipitated by stress? yes -   Guilt feelings associated with eating? yes -    Current Exercise Habits water aerobics started about 6 weeks ago - goes for 60 minutes MWF  Current Outpatient Prescriptions on File Prior to Visit  Medication Sig Dispense Refill  . Cholecalciferol (VITAMIN D) 2000 UNITS CAPS Take 2,000  Units by mouth daily.    . diclofenac sodium (VOLTAREN) 1 % GEL Use as directed for knee pain    . ezetimibe (ZETIA) 10 MG tablet Take 1 tablet (10 mg total) by mouth daily. 30 tablet 5  . fenofibrate micronized (LOFIBRA) 134 MG capsule TAKE (1) CAPSULE DAILY BEFORE BREAKFAST. 30 capsule 6  . ferrous gluconate (FERGON) 325 MG tablet Take 325 mg by mouth daily with breakfast. Reported on 06/29/2016    . fluticasone (FLONASE) 50 MCG/ACT nasal spray SPRAY 1 SPRAY IN EACH NOSTRIL TWICE DAILY. SHAKE GENTLY BEFORE EACH USE. 16 g 3  . furosemide (LASIX) 20 MG tablet Take 1 tablet (20 mg total) by mouth daily. 30 tablet 6  . gabapentin (NEURONTIN) 300 MG capsule One capsule twice a day for 2 weeks, then take one capsule in the morning and two in the evening 90 capsule 3  . glucose blood (ONE TOUCH ULTRA TEST) test strip CHECK BLOOD SUGAR TWICE A DAY AS DIRECTED 100 each 3  . levothyroxine (SYNTHROID, LEVOTHROID) 150 MCG tablet Take 1 tablet (150 mcg total) by mouth daily. 30 tablet 6  . linaclotide (LINZESS) 145 MCG CAPS capsule TAKE (1) CAPSULE DAILY 30 capsule 6  . loratadine (CLARITIN) 10 MG tablet Take 10 mg by mouth daily as needed. Prn     . metFORMIN (GLUCOPHAGE) 500 MG tablet Take 1 tablet (500 mg total) by mouth every evening. 30 tablet 6  . Multiple Vitamin (MULTIVITAMIN) capsule Take 1 capsule by mouth daily.      Marland Kitchen nystatin (MYCOSTATIN) powder Apply topically 2 (  two) times daily as needed.      Marland Kitchen omeprazole (PRILOSEC) 40 MG capsule TAKE (1) CAPSULE DAILY 30 capsule 6  . PAZEO 0.7 % SOLN Place 1 drop into both eyes every morning.    . potassium chloride (K-DUR) 10 MEQ tablet Take 1 tablet (10 mEq total) by mouth daily. 30 tablet 6  . Probiotic Product (HEALTHY COLON PO) Take 2 capsules by mouth daily.     . propranolol (INDERAL) 60 MG tablet TAKE (1) TABLET TWICE A DAY. 60 tablet 5  . RESTASIS MULTIDOSE 0.05 % ophthalmic emulsion Place 1 drop into both eyes 2 (two) times daily.    .  sertraline (ZOLOFT) 100 MG tablet Take 2 tablets (200 mg total) by mouth daily. 60 tablet 6  . SUMAtriptan (IMITREX) 6 MG/0.5ML SOSY injection Inject 0.5 mLs (6 mg total) into the skin every 2 (two) hours as needed for migraine or headache. 8 vial 5  . tiZANidine (ZANAFLEX) 2 MG tablet Take 1 tablet (2 mg total) by mouth 3 (three) times daily. 90 tablet 5  . traMADol (ULTRAM) 50 MG tablet Take 50 mg by mouth 2 (two) times daily.     . valsartan-hydrochlorothiazide (DIOVAN-HCT) 160-25 MG tablet Take 1 tablet by mouth daily. 30 tablet 6  . ziprasidone (GEODON) 60 MG capsule TAKE 1 CAPSULE IN THE EVENING 30 capsule 6   Current Facility-Administered Medications on File Prior to Visit  Medication Dose Route Frequency Provider Last Rate Last Dose  . cyanocobalamin ((VITAMIN B-12)) injection 1,000 mcg  1,000 mcg Intramuscular Q30 days Wardell Honour, MD   1,000 mcg at 08/29/16 1153    Other Potential Contributing Factors Use of alcohol: average 0 drinks/week Use of medications that may cause weight gain antipsychotics (ziprasidone / Geodon) and SSRI   Psych History: bipolar Comorbidities: diabetes mellitus, dyslipidemias, GERD, hypertension, osteoarthritis, sleep apnea/hypopnea and NASH The following portions of the patient's history were reviewed and updated as appropriate: allergies, current medications, past family history, past medical history, past social history, past surgical history and problem list.    Objective:    BP 112/72 (BP Location: Left Arm, Patient Position: Sitting, Cuff Size: Large)   Pulse 60   Ht '5\' 5"'  (1.651 m)   Wt (!) 304 lb (137.9 kg)   BMI 50.59 kg/m  Body mass index is 50.59 kg/m.  Weight has decreased by 5# over last month  Lab Review  Last A1c = 7.2% (07/05/2016) AST = 42 (07/05/2016) ALT = 40 (07/05/2016)  Last Lipid Panel (07/05/2016) Tg = 450 HDL = 32 LDL = not able to calculate    Assessment:    Obesity with BMI and comorbidities as noted  above - has lost 5# Contraindications to weight loss: none Patient readiness to commit to diet and activity changes: excellent Barriers to weight loss: limited income and stress      Plan:  1.  Continue current medications - no changes made today 2. General patient education ('Yes' if discussed, 'No' if not) Importance of long-term maintenance tx in weight loss: yes Use non-food self-rewards to reinforce behavior changes: yes Elicit support from others; identify saboteurs: yes Initial target weight loss is 10% of weight.  Patient's goal is to lose 30lbs over the next 6 months.  3. Diet interventions: low calorie (1000 kCal/d) deficit diet Proper food choices reviewed: yes Preparation techniques reviewed: yes Particularly recommended she decrease breaded and fried foods and decrease bread.  Discussed lower caloris and fat sources of protein Careful  meal planning; avoiding ad hoc eating: yes Stimulus control to control unhealthy eating: yes  4. Exercise intervention:  Informal measures, e.g. taking stairs instead of elevator: yes Formal exercise regimen: yes - patient to also add weight training to water aerobics.  Continue 3 days per week water aerobics 5. Other behavioral treatment: Discussed trigger and trigger avoidiance.   6. Patient to continue to keep a food and exercise diary either on phone with My Fitness pal or in notebook. that we will review at follow up. 7. Follow up: 4 weeks and as needed.    Above information was reviewed and discussed with patient's PCP - Dr Alain Honey Patient ID: [name], female   DOB: Aug 15, 1966, 50 y.o.   MRN: [id number]

## 2016-08-29 NOTE — Progress Notes (Signed)
Vit B12 inj 1000 mcg given Pt tolerated well

## 2016-08-29 NOTE — Patient Instructions (Signed)
Continue to limit fried foods and portions sizes.   Limit breads and breaded foods.   Continue to choose lean proteins - grilled or broiled meats.  [place], chicken, fish and venison.   Continue to eat fruit and vegetables.  OK to add cauliflower to meals.   Continue to exercise - water aerobics 3 times per week.

## 2016-08-31 ENCOUNTER — Encounter: Payer: Self-pay | Admitting: Cardiovascular Disease

## 2016-08-31 ENCOUNTER — Ambulatory Visit: Payer: Self-pay

## 2016-08-31 ENCOUNTER — Ambulatory Visit (INDEPENDENT_AMBULATORY_CARE_PROVIDER_SITE_OTHER): Payer: Medicare Other | Admitting: Cardiovascular Disease

## 2016-08-31 ENCOUNTER — Ambulatory Visit: Payer: Medicare Other | Admitting: Gastroenterology

## 2016-08-31 VITALS — BP 119/74 | HR 49 | Ht 64.0 in | Wt 303.0 lb

## 2016-08-31 DIAGNOSIS — I10 Essential (primary) hypertension: Secondary | ICD-10-CM | POA: Diagnosis not present

## 2016-08-31 DIAGNOSIS — E785 Hyperlipidemia, unspecified: Secondary | ICD-10-CM

## 2016-08-31 DIAGNOSIS — Z01818 Encounter for other preprocedural examination: Secondary | ICD-10-CM | POA: Diagnosis not present

## 2016-08-31 DIAGNOSIS — R001 Bradycardia, unspecified: Secondary | ICD-10-CM

## 2016-08-31 NOTE — Patient Instructions (Signed)
Medication Instructions:  Your physician recommends that you continue on your current medications as directed. Please refer to the Current Medication list given to you today.  Labwork: none  Testing/Procedures: Your physician has requested that you have a lexiscan myoview. For further information please visit [email or web address]. Please follow instruction sheet, as given. 2 DAY STUDY  Follow-Up: AS NEEDED

## 2016-08-31 NOTE — Progress Notes (Signed)
Cardiology Office Note   Date:  08/31/2016   ID:  AMORIAH STARNS, DOB 1966-07-24, MRN [id number]  PCP:  Wardell Honour, MD  Cardiologist:   Skeet Latch, MD   Chief Complaint  Patient presents with  . New Patient (Initial Visit)    Pt states no Sx or concerns.  . Medical Clearance    Clearance for wt loss surgery.       History of Present Illness: Morgan [name] is a 50 y.o. female with OSA, hypertension, diabetes, hyperlipidemia, morbid obesity and bipolar disorder who presents for presurgical clearance.  Morgan [name] was seen by Dr. Greer Pickerel on 07/14/16 for consideration of bariatric surgery.  She was referred to cardiology for pre-surgical risk assessnent.  Overall she has been feeling well.  She exercises in water aerobics three times per week and and denies chest pain or shortness of breath with these activities.  She is unable to walk 4 blocks or up a flight of stairs due to pain in her knees. She has osteoarthritis that limits her ability to exercise. She thinks that if she did not have knee pain she might be able to walk up a flight of stairs. She think she would probably have to stop while walking 4 blocks due to shortness of breath. She sometimes notes lower extremity edema especially in the summer time. She attributes this to her osteoarthritis. She denies orthopnea or PND. Morgan [name] does have OSA and uses her CPAP regularly.  She denies lightheadedness, dizziness, or syncope.  Morgan [name] has struggled with hyperlipidemia. In the past she was treated with several statins but developed myalgias with the mall. She has been on Zetia and fenofibrate.  Her pulmonologist told her that she cannot be on fish oil because it will affect her oxygen levels.   She is unsure what her levels are so high, and she has been limiting hydrates and fatty foods. She's been able to lose 30 pounds through diet and exercise but is hoping to get the bariatric surgery to assist in her  weight loss.    Past Medical History:  Diagnosis Date  . Allergic rhinitis   . Barrett's esophagus   . Bipolar affective disorder ([place])   . Chronic respiratory failure ([place])   . Constipation, chronic   . Diabetes mellitus without complication ([place])   . Dry eye   . Gastroparesis   . GERD (gastroesophageal reflux disease)   . Hyperlipidemia   . Hypertension   . Intractable chronic migraine without aura 06/04/2015  . Migraine headache   . Morbid obesity ([place])   . OSA (obstructive sleep apnea)    uses a cpap  . Osteoarthritis    bilateral knee  . Sleep apnea    has c-pap  . Unspecified hypothyroidism   . Vitamin B 12 deficiency   . Vitamin D deficiency     Past Surgical History:  Procedure Laterality Date  . APPENDECTOMY  1978  . CHOLECYSTECTOMY  2005  . SHOULDER ARTHROSCOPY  7/12   left-dsc  . TONSILLECTOMY  at age 6  . TRIGGER FINGER RELEASE  12/20/2012   Procedure: RELEASE TRIGGER FINGER/A-1 PULLEY;  Surgeon: Tennis Must, MD;  Location: [place];  Service: Orthopedics;  Laterality: Left;  LEFT TRIGGER THUMB RELEASE     Current Outpatient Prescriptions  Medication Sig Dispense Refill  . Cholecalciferol (VITAMIN D) 2000 UNITS CAPS Take 2,000 Units by mouth daily.    . cyanocobalamin (,VITAMIN B-12,)  1000 MCG/ML injection Inject 1,000 mcg into the muscle once.    . diclofenac sodium (VOLTAREN) 1 % GEL Use as directed for knee pain    . ezetimibe (ZETIA) 10 MG tablet Take 1 tablet (10 mg total) by mouth daily. 30 tablet 5  . fenofibrate micronized (LOFIBRA) 134 MG capsule TAKE (1) CAPSULE DAILY BEFORE BREAKFAST. 30 capsule 6  . ferrous gluconate (FERGON) 325 MG tablet Take 325 mg by mouth daily with breakfast. Reported on 06/29/2016    . fluticasone (FLONASE) 50 MCG/ACT nasal spray SPRAY 1 SPRAY IN EACH NOSTRIL TWICE DAILY. SHAKE GENTLY BEFORE EACH USE. 16 g 3  . furosemide (LASIX) 20 MG tablet Take 1 tablet (20 mg total) by mouth daily. 30 tablet 6  .  gabapentin (NEURONTIN) 300 MG capsule One capsule twice a day for 2 weeks, then take one capsule in the morning and two in the evening 90 capsule 3  . glucose blood (ONE TOUCH ULTRA TEST) test strip CHECK BLOOD SUGAR TWICE A DAY AS DIRECTED 100 each 3  . levothyroxine (SYNTHROID, LEVOTHROID) 150 MCG tablet Take 1 tablet (150 mcg total) by mouth daily. 30 tablet 6  . linaclotide (LINZESS) 145 MCG CAPS capsule TAKE (1) CAPSULE DAILY 30 capsule 6  . loratadine (CLARITIN) 10 MG tablet Take 10 mg by mouth daily as needed. Prn     . metFORMIN (GLUCOPHAGE) 500 MG tablet Take 1 tablet (500 mg total) by mouth every evening. 30 tablet 6  . Multiple Vitamin (MULTIVITAMIN) capsule Take 1 capsule by mouth daily.      Marland Kitchen nystatin (MYCOSTATIN) powder Apply topically 2 (two) times daily as needed.      Marland Kitchen omeprazole (PRILOSEC) 40 MG capsule TAKE (1) CAPSULE DAILY 30 capsule 6  . PAZEO 0.7 % SOLN Place 1 drop into both eyes every morning.    . potassium chloride (K-DUR) 10 MEQ tablet Take 1 tablet (10 mEq total) by mouth daily. 30 tablet 6  . Probiotic Product (HEALTHY COLON PO) Take 2 capsules by mouth daily.     . propranolol (INDERAL) 60 MG tablet TAKE (1) TABLET TWICE A DAY. 60 tablet 5  . RESTASIS MULTIDOSE 0.05 % ophthalmic emulsion Place 1 drop into both eyes 2 (two) times daily.    . sertraline (ZOLOFT) 100 MG tablet Take 2 tablets (200 mg total) by mouth daily. 60 tablet 6  . SUMAtriptan (IMITREX) 6 MG/0.5ML SOSY injection Inject 0.5 mLs (6 mg total) into the skin every 2 (two) hours as needed for migraine or headache. 8 vial 5  . tiZANidine (ZANAFLEX) 2 MG tablet Take 1 tablet (2 mg total) by mouth 3 (three) times daily. 90 tablet 5  . traMADol (ULTRAM) 50 MG tablet Take 50 mg by mouth 2 (two) times daily.     . valsartan-hydrochlorothiazide (DIOVAN-HCT) 160-25 MG tablet Take 1 tablet by mouth daily. 30 tablet 6  . ziprasidone (GEODON) 60 MG capsule TAKE 1 CAPSULE IN THE EVENING 30 capsule 6   Current  Facility-Administered Medications  Medication Dose Route Frequency Provider Last Rate Last Dose  . cyanocobalamin ((VITAMIN B-12)) injection 1,000 mcg  1,000 mcg Intramuscular Q30 days Wardell Honour, MD   1,000 mcg at 08/29/16 1153    Allergies:   Codeine; Ketoconazole; Pravachol [pravastatin sodium]; and Statins    Social History:  The patient  reports that she has never smoked. She has never used smokeless tobacco. She reports that she does not drink alcohol or use drugs.   Family  History:  The patient's family history includes Allergies in her brother, father, mother, and sister; Arthritis in her father and mother; Asthma in her father, sister, and sister; Cancer in her sister; Colon cancer in her sister; Colon polyps in her sister; Diabetes in her brother, daughter, father, mother, and sister; Early death (age of onset: 41) in her brother; GI problems in her mother and sister; Heart disease in her father and paternal aunt; Hyperlipidemia in her brother, daughter, father, mother, and sister; Hypertension in her brother, daughter, and mother; Liver disease in her sister; Peripheral vascular disease in her father; Stroke in her sister; Stroke (age of onset: 60) in her mother.    ROS:  Please see the history of present illness.   Otherwise, review of systems are positive for none.   All other systems are reviewed and negative.    PHYSICAL EXAM: VS:  BP 119/74   Pulse (!) 49   Ht 5\' 4"  (1.626 m)   Wt (!) 303 lb (137.4 kg)   BMI 52.01 kg/m  , BMI Body mass index is 52.01 kg/m. GENERAL:  Well appearing.  Morbidly obse.  HEENT:  Pupils equal round and reactive, fundi not visualized, oral mucosa unremarkable NECK:  No jugular venous distention, waveform within normal limits, carotid upstroke brisk and symmetric, no bruits, no thyromegaly LYMPHATICS:  No cervical adenopathy LUNGS:  Clear to auscultation bilaterally HEART:  RRR.  PMI not displaced or sustained,S1 and S2 within normal limits,  no S3, no S4, no clicks, no rubs, no murmurs ABD:  Flat, positive bowel sounds normal in frequency in pitch, no bruits, no rebound, no guarding, no midline pulsatile mass, no hepatomegaly, no splenomegaly EXT:  2 plus pulses throughout, no edema, no cyanosis no clubbing SKIN:  No rashes no nodules NEURO:  Cranial nerves II through XII grossly intact, motor grossly intact throughout PSYCH:  Cognitively intact, oriented to person place and time   EKG:  EKG is ordered today. The ekg ordered today demonstrates Sinus bradycardia. Rate 49 bpm.  Recent Labs: 07/05/2016: ALT 40; BUN 23; Creatinine, Ser 0.88; Potassium 4.0; Sodium 144; TSH 2.330    Lipid Panel    Component Value Date/Time   CHOL 179 07/05/2016 0805   CHOL 225 (H) 06/18/2013 1009   TRIG 450 (H) 07/05/2016 0805   TRIG 259 (H) 08/29/2014 0838   TRIG 314 (H) 06/18/2013 1009   HDL 32 (L) 07/05/2016 0805   HDL 39 (L) 08/29/2014 0838   HDL 33 (L) 06/18/2013 1009   CHOLHDL 5.6 (H) 07/05/2016 0805   LDLCALC Comment 07/05/2016 0805   LDLCALC 115 (H) 08/29/2014 0838   LDLCALC 129 (H) 06/18/2013 1009      Wt Readings from Last 3 Encounters:  08/31/16 (!) 303 lb (137.4 kg)  08/29/16 (!) 304 lb (137.9 kg)  07/26/16 (!) 309 lb (140.2 kg)      ASSESSMENT AND PLAN:  # Presurgical risk: Morgan [name] does not have exertional chest pain but she does have shortness of breath that limits her ability to walk 4 blocks.  Therefore we will refer her for Lexiscan Myoview to assess her surgical risk prior to bariatric surgery.  # Hyperlipidemia: Morgan [name] has hyperlipidemia and is on both Zetia and fenofibrate.  She is unable to tolerate statins and is not allowed to take fish oil per her pulmonologist.  She is scheduled to have her lipids retested next month. We will not make any changes at this time. Given that she is no  longer requiring supplemental oxygen, may be fish oil can be considered, as it would help her  hypertriglyceridemia.  # Bradycardia: Morgan [name] is asymtpomatic And reports that her heart rate is usually not this low. She takes propranolol to help her sleep at night. Given that she is asymptomatic, no changes are needed at this time.  Current medicines are reviewed at length with the patient today.  The patient does not have concerns regarding medicines.  The following changes have been made:  no change  Labs/ tests ordered today include:   Orders Placed This Encounter  Procedures  . Myocardial Perfusion Imaging  . EKG 12-Lead     Disposition:   FU with Mariel Lukins C. Oval Linsey, MD, [facility] as needed.    This note was written with the assistance of speech recognition software.  Please excuse any transcriptional errors.  Signed, Erhard Senske C. Oval Linsey, MD, [facility]  08/31/2016 1:09 PM    [place]

## 2016-09-01 ENCOUNTER — Ambulatory Visit (INDEPENDENT_AMBULATORY_CARE_PROVIDER_SITE_OTHER): Payer: 59 | Admitting: Psychiatry

## 2016-09-01 DIAGNOSIS — F319 Bipolar disorder, unspecified: Secondary | ICD-10-CM | POA: Diagnosis not present

## 2016-09-06 ENCOUNTER — Ambulatory Visit (INDEPENDENT_AMBULATORY_CARE_PROVIDER_SITE_OTHER): Payer: 59 | Admitting: Psychiatry

## 2016-09-07 ENCOUNTER — Other Ambulatory Visit: Payer: Self-pay | Admitting: Neurology

## 2016-09-07 ENCOUNTER — Other Ambulatory Visit: Payer: Self-pay | Admitting: Family Medicine

## 2016-09-08 ENCOUNTER — Other Ambulatory Visit: Payer: Self-pay | Admitting: *Deleted

## 2016-09-08 MED ORDER — NYSTATIN 100000 UNIT/GM EX POWD: Freq: Two times a day (BID) | CUTANEOUS | 1 refills | Status: DC | PRN

## 2016-09-14 ENCOUNTER — Ambulatory Visit (INDEPENDENT_AMBULATORY_CARE_PROVIDER_SITE_OTHER): Payer: Medicare Other | Admitting: Neurology

## 2016-09-14 ENCOUNTER — Encounter: Payer: Self-pay | Admitting: Neurology

## 2016-09-14 VITALS — BP 124/78 | HR 72 | Resp 20 | Ht 64.0 in | Wt 300.0 lb

## 2016-09-14 DIAGNOSIS — G43719 Chronic migraine without aura, intractable, without status migrainosus: Secondary | ICD-10-CM

## 2016-09-14 MED ORDER — GABAPENTIN 300 MG PO CAPS: ORAL_CAPSULE | ORAL | 2 refills | Status: DC

## 2016-09-14 NOTE — Progress Notes (Signed)
Reason for visit: Migraine headache  Morgan [name] is an 50 y.o. female  History of present illness:  Morgan [name] is a 50 year old right-handed white female with a history of morbid obesity and intractable migraine headaches. The patient has been placed on gabapentin and she is on propranolol for headaches. She has had some improvement in her headache frequency and severity since that time. She is currently having 8-12 headaches a month, 3 or 4 these headaches are incapacitating. The patient may go up to a week without any headaches. The patient is now on injectable Imitrex which seems to be more effective than the oral Maxalt. She in the past has been on Botox treatments, she has had difficulty affording the therapy and had to stop the medication. The Botox was effective. The patient is considering bariatric surgery for her obesity. She returns to the office today for an evaluation.  Past Medical History:  Diagnosis Date  . Allergic rhinitis   . Barrett's esophagus   . Bipolar affective disorder ([place])   . Chronic respiratory failure ([place])   . Constipation, chronic   . Diabetes mellitus without complication ([place])   . Dry eye   . Gastroparesis   . GERD (gastroesophageal reflux disease)   . Hyperlipidemia   . Hypertension   . Intractable chronic migraine without aura 06/04/2015  . Migraine headache   . Morbid obesity ([place])   . OSA (obstructive sleep apnea)    uses a cpap  . Osteoarthritis    bilateral knee  . Sleep apnea    has c-pap  . Unspecified hypothyroidism   . Vitamin B 12 deficiency   . Vitamin D deficiency     Past Surgical History:  Procedure Laterality Date  . APPENDECTOMY  1978  . CHOLECYSTECTOMY  2005  . SHOULDER ARTHROSCOPY  7/12   left-dsc  . TONSILLECTOMY  at age 89  . TRIGGER FINGER RELEASE  12/20/2012   Procedure: RELEASE TRIGGER FINGER/A-1 PULLEY;  Surgeon: Tennis Must, MD;  Location: [place];  Service: Orthopedics;  Laterality:  Left;  LEFT TRIGGER THUMB RELEASE    Family History  Problem Relation Age of Onset  . Asthma Father   . Allergies Father   . Heart disease Father     enlarged heart  . Peripheral vascular disease Father   . Diabetes Father   . Hyperlipidemia Father   . Arthritis Father   . Asthma Sister   . Cancer Sister     colon at 54 yr old.  . Colon cancer Sister   . Allergies Mother   . Stroke Mother 60    with hemi paralysis  . Diabetes Mother   . Hyperlipidemia Mother   . Hypertension Mother   . GI problems Mother   . Arthritis Mother   . Allergies Brother   . Early death Brother 9    congenital abormality  . Allergies Sister   . Diabetes Sister   . Asthma Sister   . Colon polyps Sister   . Hyperlipidemia Sister   . GI problems Sister     gastroporesis   . Liver disease Sister     fatty liver  . Stroke Sister     intercrandial bleed  . Diabetes Brother   . Hypertension Brother   . Hyperlipidemia Brother   . Diabetes Daughter   . Hyperlipidemia Daughter   . Hypertension Daughter   . Heart disease Paternal Aunt   . Migraines Neg Hx  Social history:  reports that she has never smoked. She has never used smokeless tobacco. She reports that she does not drink alcohol or use drugs.    Allergies  Allergen Reactions  . Codeine     REACTION: increased BP  . Ketoconazole     REACTION: hives, swelling  . Pravachol [Pravastatin Sodium] Swelling    Throat swelling  . Statins     REACTION: leg crapms    Medications:  Prior to Admission medications   Medication Sig Start Date End Date Taking? Authorizing Provider  Cholecalciferol (VITAMIN D) 2000 UNITS CAPS Take 2,000 Units by mouth daily.   Yes Historical Provider, MD  cyanocobalamin (,VITAMIN B-12,) 1000 MCG/ML injection Inject 1,000 mcg into the muscle once.   Yes Historical Provider, MD  diclofenac sodium (VOLTAREN) 1 % GEL Use as directed for knee pain 10/02/15  Yes Historical Provider, MD  ezetimibe (ZETIA) 10 MG  tablet Take 1 tablet (10 mg total) by mouth daily. 06/08/16  Yes Wardell Honour, MD  fenofibrate micronized (LOFIBRA) 134 MG capsule TAKE (1) CAPSULE DAILY BEFORE BREAKFAST. 07/05/16  Yes Wardell Honour, MD  ferrous gluconate (FERGON) 325 MG tablet Take 325 mg by mouth daily with breakfast. Reported on 06/29/2016   Yes Historical Provider, MD  fluticasone (FLONASE) 50 MCG/ACT nasal spray SPRAY 1 SPRAY IN EACH NOSTRIL TWICE DAILY. SHAKE GENTLY BEFORE EACH [place] E. 09/08/16  Yes Wardell Honour, MD  furosemide (LASIX) 20 MG tablet Take 1 tablet (20 mg total) by mouth daily. 07/05/16  Yes Wardell Honour, MD  gabapentin (NEURONTIN) 300 MG capsule Take 1 cap in the morning and 2 in the evening 09/07/16  Yes Kathrynn Ducking, MD  glucose blood (ONE TOUCH ULTRA TEST) test strip CHECK BLOOD SUGAR TWICE A DAY AS DIRECTED 07/05/16  Yes Wardell Honour, MD  levothyroxine (SYNTHROID, LEVOTHROID) 150 MCG tablet Take 1 tablet (150 mcg total) by mouth daily. 07/05/16  Yes Wardell Honour, MD  linaclotide [facility]) 145 MCG CAPS capsule TAKE (1) CAPSULE DAILY 07/05/16  Yes Wardell Honour, MD  loratadine (CLARITIN) 10 MG tablet Take 10 mg by mouth daily as needed. Prn    Yes Historical Provider, MD  metFORMIN (GLUCOPHAGE) 500 MG tablet Take 1 tablet (500 mg total) by mouth every evening. 07/05/16  Yes Wardell Honour, MD  Multiple Vitamin (MULTIVITAMIN) capsule Take 1 capsule by mouth daily.     Yes Historical Provider, MD  nystatin (MYCOSTATIN/NYSTOP) powder Apply topically 2 (two) times daily as needed. 09/08/16  Yes Wardell Honour, MD  omeprazole (PRILOSEC) 40 MG capsule TAKE (1) CAPSULE DAILY 07/05/16  Yes Wardell Honour, MD  PAZEO 0.7 % SOLN Place 1 drop into both eyes every morning. 06/10/16  Yes Historical Provider, MD  potassium chloride (K-DUR) 10 MEQ tablet Take 1 tablet (10 mEq total) by mouth daily. 07/05/16  Yes Wardell Honour, MD  Probiotic Product (HEALTHY COLON PO) Take 2 capsules by mouth daily.     Yes Historical Provider, MD  propranolol (INDERAL) 60 MG tablet TAKE (1) TABLET TWICE A DAY. 05/23/16  Yes Kathrynn Ducking, MD  RESTASIS MULTIDOSE 0.05 % ophthalmic emulsion Place 1 drop into both eyes 2 (two) times daily. 06/17/16  Yes Historical Provider, MD  sertraline (ZOLOFT) 100 MG tablet Take 2 tablets (200 mg total) by mouth daily. 07/05/16  Yes Wardell Honour, MD  SUMAtriptan (IMITREX) 6 MG/0.5ML SOSY injection Inject 0.5 mLs (6 mg total) into the skin  every 2 (two) hours as needed for migraine or headache. 05/10/16  Yes Kathrynn Ducking, MD  tiZANidine (ZANAFLEX) 2 MG tablet Take 1 tablet (2 mg total) by mouth 3 (three) times daily. 05/10/16  Yes Kathrynn Ducking, MD  traMADol (ULTRAM) 50 MG tablet Take 50 mg by mouth 2 (two) times daily.  02/27/13  Yes Historical Provider, MD  valsartan-hydrochlorothiazide (DIOVAN-HCT) 160-25 MG tablet Take 1 tablet by mouth daily. 07/05/16  Yes Wardell Honour, MD  ziprasidone (GEODON) 60 MG capsule TAKE 1 CAPSULE IN THE EVENING 07/05/16  Yes Wardell Honour, MD    ROS:  Out of a complete 14 system review of symptoms, the patient complains only of the following symptoms, and all other reviewed systems are negative.  Constipation Sleep apnea Joint pain, neck pain Headache Depression, anxiety  Blood pressure 124/78, pulse 72, resp. rate 20, height 5\' 4"  (1.626 m), weight 300 lb (136.1 kg).  Physical Exam  General: The patient is alert and cooperative at the time of the examination. The patient is morbidly obese.  Skin: No significant peripheral edema is noted.   Neurologic Exam  Mental status: The patient is alert and oriented x 3 at the time of the examination. The patient has apparent normal recent and remote memory, with an apparently normal attention span and concentration ability.   Cranial nerves: Facial symmetry is present. Speech is normal, no aphasia or dysarthria is noted. Extraocular movements are full. Visual fields are  full.  Motor: The patient has good strength in all 4 extremities.  Sensory examination: Soft touch sensation is symmetric on the face, arms, and legs.  Coordination: The patient has good finger-nose-finger and heel-to-shin bilaterally.  Gait and station: The patient has a normal gait. Tandem gait is normal. Romberg is negative. No drift is seen.  Reflexes: Deep tendon reflexes are symmetric.   Assessment/Plan:  1. Intractable migraine headache  The patient will go up on the gabapentin taking 600 mg in the morning and 900 mg in the evening. The patient will continue the propranolol, she will follow-up in about 4 months. The headache frequency has been helped by the gabapentin, Botox can still be considered in the future if the patient can get the medication through specialty pharmacy. A prescription for the gabapentin was called in.  Jill Alexanders MD 09/14/2016 7:30 AM  [facility] [address] [place], [place] [address]  Phone [phone] Fax [phone]

## 2016-09-14 NOTE — Patient Instructions (Signed)
   With the gabapentin 300 mg capsules, take 2 twice a day for 2 weeks, then go to 2 in the morning and 3 in the evening

## 2016-09-15 ENCOUNTER — Telehealth (HOSPITAL_COMMUNITY): Payer: Self-pay

## 2016-09-15 NOTE — Telephone Encounter (Signed)
Encounter complete. 

## 2016-09-20 ENCOUNTER — Ambulatory Visit (HOSPITAL_COMMUNITY)
Admission: RE | Admit: 2016-09-20 | Discharge: 2016-09-20 | Disposition: A | Discharge status: Home / Self Care | Payer: Medicare Other | Source: Ambulatory Visit | Attending: Cardiovascular Disease | Admitting: Cardiovascular Disease

## 2016-09-20 DIAGNOSIS — E785 Hyperlipidemia, unspecified: Secondary | ICD-10-CM | POA: Insufficient documentation

## 2016-09-20 DIAGNOSIS — E039 Hypothyroidism, unspecified: Secondary | ICD-10-CM | POA: Insufficient documentation

## 2016-09-20 DIAGNOSIS — Z6841 Body Mass Index (BMI) 40.0 and over, adult: Secondary | ICD-10-CM | POA: Insufficient documentation

## 2016-09-20 DIAGNOSIS — G4733 Obstructive sleep apnea (adult) (pediatric): Secondary | ICD-10-CM | POA: Insufficient documentation

## 2016-09-20 DIAGNOSIS — Z01818 Encounter for other preprocedural examination: Secondary | ICD-10-CM | POA: Diagnosis not present

## 2016-09-20 DIAGNOSIS — I10 Essential (primary) hypertension: Secondary | ICD-10-CM | POA: Diagnosis not present

## 2016-09-20 MED ORDER — REGADENOSON 0.4 MG/5ML IV SOLN
0.4000 mg | Freq: Once | INTRAVENOUS | Status: AC
Start: 2016-09-20 — End: 2016-09-20
  Administered 2016-09-20: 0.4 mg via INTRAVENOUS

## 2016-09-20 MED ORDER — TECHNETIUM TC 99M TETROFOSMIN IV KIT
30.2000 | PACK | Freq: Once | INTRAVENOUS | Status: AC | PRN
Start: 2016-09-20 — End: 2016-09-20
  Administered 2016-09-20: 30.2 via INTRAVENOUS
  Filled 2016-09-20: qty 31

## 2016-09-21 ENCOUNTER — Ambulatory Visit (HOSPITAL_COMMUNITY)
Admission: RE | Admit: 2016-09-21 | Discharge: 2016-09-21 | Disposition: A | Discharge status: Home / Self Care | Payer: Medicare Other | Source: Ambulatory Visit | Attending: Cardiovascular Disease | Admitting: Cardiovascular Disease

## 2016-09-21 LAB — MYOCARDIAL PERFUSION IMAGING
CHL CUP NUCLEAR SDS: 6
CHL CUP RESTING HR STRESS: 50 {beats}/min
CSEPPHR: 66 {beats}/min
LV sys vol: 47 mL
LVDIAVOL: 121 mL (ref 46–106)
NUC STRESS TID: 1.02
SRS: 0
SSS: 6

## 2016-09-21 MED ORDER — TECHNETIUM TC 99M TETROFOSMIN IV KIT
30.6000 | PACK | Freq: Once | INTRAVENOUS | Status: AC | PRN
  Administered 2016-09-21: 30.6 via INTRAVENOUS

## 2016-09-22 DIAGNOSIS — G4733 Obstructive sleep apnea (adult) (pediatric): Secondary | ICD-10-CM | POA: Diagnosis not present

## 2016-09-22 DIAGNOSIS — M170 Bilateral primary osteoarthritis of knee: Secondary | ICD-10-CM | POA: Diagnosis not present

## 2016-09-23 ENCOUNTER — Encounter: Payer: Self-pay | Admitting: *Deleted

## 2016-09-28 ENCOUNTER — Ambulatory Visit: Payer: Medicare Other | Admitting: Family Medicine

## 2016-09-28 DIAGNOSIS — R0902 Hypoxemia: Secondary | ICD-10-CM | POA: Diagnosis not present

## 2016-09-28 DIAGNOSIS — G4733 Obstructive sleep apnea (adult) (pediatric): Secondary | ICD-10-CM | POA: Diagnosis not present

## 2016-09-29 ENCOUNTER — Ambulatory Visit (INDEPENDENT_AMBULATORY_CARE_PROVIDER_SITE_OTHER): Payer: Medicare Other | Admitting: Pharmacist

## 2016-09-29 ENCOUNTER — Encounter: Payer: Self-pay | Admitting: Pharmacist

## 2016-09-29 ENCOUNTER — Ambulatory Visit (INDEPENDENT_AMBULATORY_CARE_PROVIDER_SITE_OTHER): Payer: Medicare Other | Admitting: *Deleted

## 2016-09-29 DIAGNOSIS — Z23 Encounter for immunization: Secondary | ICD-10-CM

## 2016-09-29 DIAGNOSIS — E781 Pure hyperglyceridemia: Secondary | ICD-10-CM | POA: Diagnosis not present

## 2016-09-29 DIAGNOSIS — E538 Deficiency of other specified B group vitamins: Secondary | ICD-10-CM

## 2016-09-29 DIAGNOSIS — E119 Type 2 diabetes mellitus without complications: Secondary | ICD-10-CM

## 2016-09-29 LAB — BAYER DCA HB A1C WAIVED: HB A1C: 6.5 % (ref <7.0)

## 2016-09-29 NOTE — Progress Notes (Signed)
Patient tolerated well.

## 2016-09-29 NOTE — Progress Notes (Addendum)
Patient ID: Morgan [name], female   DOB: [birth year], 50 y.o.   MRN: [id number]  Subjective:     Morgan [name] is a 50 y.o. female who I am asked to see in consultation with Morgan Morgan [name] for evaluation and treatment of obesity. Morgan [name] is interested in bariatric surgery. She has met with surgeon at [facility] and also nutritionist.  She reports that she has seen cardiologist since our last visit and has been cleared for surgery. She is requried by her insurance to complete 6 months physician supervised weight loss visits.  This is her third visit with me and she saw Morgan [name] regarding weight July 2017. Morgan [name] has been following a low calorie diet and exercising regularly for the last 4 months - see below for further details.  Patient cites health, increased physical ability, self-image as reasons for wanting to lose weight.  History of Weight Loss Efforts Greatest amount of weight lost: 150 lbs over 10 months Amount of time that loss was maintained: 4 months Circumstances associated with regain of weight: stopped phentermine Successful weight loss techniques attempted: prescription appetite suppressants: phentermine and supervised diet program Unsuccessful weight loss techniques attempted: self-directed dieting and vegan diet  Current Eating Habits Number of regular meals per day: 3 Number of snacking episodes per day: 1 Patient is keeping food journal but she forget to bring in today.  She is keeping meals small.  Since our last visit she has made many great changes.  She is choosing lean, lower fat proteins like fish / tuna.  She has decreased her intake of bread and starchy vegetables.  Increase in green beans and broccoli and also fruit such as apples and clementines.  Who shops for food? patient Who prepares food? patient Who eats with patient? patient Binge behavior?: yes -   Purge behavior? no Anorexic behavior? no Eating precipitated by stress? yes  -   Guilt feelings associated with eating? yes -    Current Exercise Habits - continues to do water aerobics for 60 minutes MWF  Current Outpatient Prescriptions on File Prior to Visit  Medication Sig Dispense Refill  . Cholecalciferol (VITAMIN D) 2000 UNITS CAPS Take 2,000 Units by mouth daily.    . cyanocobalamin (,VITAMIN B-12,) 1000 MCG/ML injection Inject 1,000 mcg into the muscle once.    . diclofenac sodium (VOLTAREN) 1 % GEL Use as directed for knee pain    . ezetimibe (ZETIA) 10 MG tablet Take 1 tablet (10 mg total) by mouth daily. 30 tablet 5  . fenofibrate micronized (LOFIBRA) 134 MG capsule TAKE (1) CAPSULE DAILY BEFORE BREAKFAST. 30 capsule 6  . ferrous gluconate (FERGON) 325 MG tablet Take 325 mg by mouth daily with breakfast. Reported on 06/29/2016    . fluticasone (FLONASE) 50 MCG/ACT nasal spray SPRAY 1 SPRAY IN EACH NOSTRIL TWICE DAILY. SHAKE GENTLY BEFORE EACH [place] E. 16 g 0  . furosemide (LASIX) 20 MG tablet Take 1 tablet (20 mg total) by mouth daily. 30 tablet 6  . gabapentin (NEURONTIN) 300 MG capsule Two capsules in the morning and 3 in the evening 450 capsule 2  . glucose blood (ONE TOUCH ULTRA TEST) test strip CHECK BLOOD SUGAR TWICE A DAY AS DIRECTED 100 each 3  . levothyroxine (SYNTHROID, LEVOTHROID) 150 MCG tablet Take 1 tablet (150 mcg total) by mouth daily. 30 tablet 6  . linaclotide (LINZESS) 145 MCG CAPS capsule TAKE (1) CAPSULE DAILY 30 capsule 6  . loratadine (CLARITIN) 10  MG tablet Take 10 mg by mouth daily as needed. Prn     . metFORMIN (GLUCOPHAGE) 500 MG tablet Take 1 tablet (500 mg total) by mouth every evening. 30 tablet 6  . Multiple Vitamin (MULTIVITAMIN) capsule Take 1 capsule by mouth daily.      Marland Kitchen nystatin (MYCOSTATIN/NYSTOP) powder Apply topically 2 (two) times daily as needed. 15 g 1  . omeprazole (PRILOSEC) 40 MG capsule TAKE (1) CAPSULE DAILY 30 capsule 6  . PAZEO 0.7 % SOLN Place 1 drop into both eyes every morning.    . potassium chloride  (K-DUR) 10 MEQ tablet Take 1 tablet (10 mEq total) by mouth daily. 30 tablet 6  . Probiotic Product (HEALTHY COLON PO) Take 2 capsules by mouth daily.     . propranolol (INDERAL) 60 MG tablet TAKE (1) TABLET TWICE A DAY. 60 tablet 5  . RESTASIS MULTIDOSE 0.05 % ophthalmic emulsion Place 1 drop into both eyes 2 (two) times daily.    . sertraline (ZOLOFT) 100 MG tablet Take 2 tablets (200 mg total) by mouth daily. 60 tablet 6  . SUMAtriptan (IMITREX) 6 MG/0.5ML SOSY injection Inject 0.5 mLs (6 mg total) into the skin every 2 (two) hours as needed for migraine or headache. 8 vial 5  . tiZANidine (ZANAFLEX) 2 MG tablet Take 1 tablet (2 mg total) by mouth 3 (three) times daily. 90 tablet 5  . traMADol (ULTRAM) 50 MG tablet Take 50 mg by mouth 2 (two) times daily.     . valsartan-hydrochlorothiazide (DIOVAN-HCT) 160-25 MG tablet Take 1 tablet by mouth daily. 30 tablet 6  . ziprasidone (GEODON) 60 MG capsule TAKE 1 CAPSULE IN THE EVENING 30 capsule 6   Current Facility-Administered Medications on File Prior to Visit  Medication Dose Route Frequency Provider Last Rate Last Dose  . cyanocobalamin ((VITAMIN B-12)) injection 1,000 mcg  1,000 mcg Intramuscular Q30 days Frederica Kuster, MD   1,000 mcg at 08/29/16 1153    Other Potential Contributing Factors Use of alcohol: average 0 drinks/week Use of medications that may cause weight gain antipsychotics (ziprasidone / Geodon) and SSRI   Psych History: bipolar Comorbidities: diabetes mellitus, dyslipidemias, GERD, hypertension, osteoarthritis, sleep apnea/hypopnea and NASH The following portions of the patient's history were reviewed and updated as appropriate: allergies, current medications, past family history, past medical history, past social history, past surgical history and problem list.    Objective:    BP 128/80 (Cuff Size: Large)   Pulse (!) 56   Ht 5\' 4"  (1.626 m)   Wt 295 lb 4 oz (133.9 kg)   BMI 50.68 kg/m  Body mass index is 50.68  kg/m.  Weight has decreased by 7# over last month and has lost a total of 15# since July 2017  Lab Review  Last A1c = 7.2% (07/05/2016) AST = 42 (07/05/2016) ALT = 40 (07/05/2016)  Last Lipid Panel (07/05/2016) Tg = 450 HDL = 32 LDL = not able to calculate    Assessment:    Obesity with BMI and comorbidities as noted above - has lost 15# Contraindications to weight loss: none Patient readiness to commit to diet and activity changes: excellent Barriers to weight loss: limited income and stress      Plan:  1.  Continue current medications - no changes made today 2. General patient education ('Yes' if discussed, 'No' if not) Importance of long-term maintenance tx in weight loss: yes Use non-food self-rewards to reinforce behavior changes: yes Elicit support from others; identify  saboteurs: yes Initial target weight loss is 10% of weight.  Patient's goal is to lose 30lbs over 6 months. She is half way to her target and has 3 months to go. 3. Diet interventions: Continue low calorie (1000 kCal/d) deficit diet Proper food choices reviewed: yes Preparation techniques reviewed: yes Careful meal planning; avoiding ad hoc eating: yes Stimulus control to control unhealthy eating: yes  4. Exercise intervention:  Informal measures, e.g. taking stairs instead of elevator: yes Formal exercise regimen: yes -Continue 3 days per week water aerobics and try to add weight training along with this. 5. Patient to continue to keep a food and exercise diary either on phone with My Fitness pal or in notebook. that we will review at follow up. 6. Follow up: 4 weeks and as needed.    Orders Placed This Encounter  Procedures  . Bayer DCA Hb A1c Waived  . CMP14+EGFR  . Lipid panel  . LDL Cholesterol, Direct    Above information was reviewed and discussed with patient's PCP - Morgan Morgan [name]  Patient ID: [name], female   DOB: 01-May-1966, 50 y.o.   MRN: [id number] Patient ID: SACHI BOULAY, female   DOB: [birth year], 50 y.o.   MRN: [id number]

## 2016-09-30 LAB — CMP14+EGFR
A/G RATIO: 1.5 (ref 1.2–2.2)
ALK PHOS: 65 IU/L (ref 39–117)
ALT: 15 IU/L (ref 0–32)
AST: 17 IU/L (ref 0–40)
Albumin: 4.3 g/dL (ref 3.5–5.5)
BILIRUBIN TOTAL: 0.2 mg/dL (ref 0.0–1.2)
BUN/Creatinine Ratio: 22 (ref 9–23)
BUN: 19 mg/dL (ref 6–24)
CHLORIDE: 98 mmol/L (ref 96–106)
CO2: 26 mmol/L (ref 18–29)
Calcium: 9.6 mg/dL (ref 8.7–10.2)
Creatinine, Ser: 0.87 mg/dL (ref 0.57–1.00)
GFR calc Af Amer: 90 mL/min/{1.73_m2} (ref >59)
GFR calc non Af Amer: 78 mL/min/{1.73_m2} (ref >59)
GLUCOSE: 95 mg/dL (ref 65–99)
Globulin, Total: 2.9 g/dL (ref 1.5–4.5)
POTASSIUM: 4.1 mmol/L (ref 3.5–5.2)
Sodium: 142 mmol/L (ref 134–144)
Total Protein: 7.2 g/dL (ref 6.0–8.5)

## 2016-09-30 LAB — LIPID PANEL
CHOLESTEROL TOTAL: 190 mg/dL (ref 100–199)
Chol/HDL Ratio: 4.5 ratio units — ABNORMAL HIGH (ref 0.0–4.4)
HDL: 42 mg/dL (ref >39)
LDL Calculated: 112 mg/dL — ABNORMAL HIGH (ref 0–99)
Triglycerides: 179 mg/dL — ABNORMAL HIGH (ref 0–149)
VLDL CHOLESTEROL CAL: 36 mg/dL (ref 5–40)

## 2016-09-30 LAB — LDL CHOLESTEROL, DIRECT: LDL Direct: 122 mg/dL — ABNORMAL HIGH (ref 0–99)

## 2016-10-13 ENCOUNTER — Encounter: Payer: Self-pay | Admitting: Internal Medicine

## 2016-10-13 ENCOUNTER — Ambulatory Visit (INDEPENDENT_AMBULATORY_CARE_PROVIDER_SITE_OTHER): Payer: Medicare Other | Admitting: Internal Medicine

## 2016-10-13 VITALS — BP 130/70 | HR 50 | Ht 64.0 in | Wt 292.8 lb

## 2016-10-13 DIAGNOSIS — G4733 Obstructive sleep apnea (adult) (pediatric): Secondary | ICD-10-CM

## 2016-10-13 DIAGNOSIS — J9611 Chronic respiratory failure with hypoxia: Secondary | ICD-10-CM

## 2016-10-13 NOTE — Assessment & Plan Note (Signed)
Oxygen dependence during sleep most consistent with obesity hypoventilation syndrome. Planned bariatric surgery hopefully will resolve this issue.

## 2016-10-13 NOTE — Progress Notes (Signed)
07/30/14- Dr Gwenette Greet The patient comes in today for followup of her obstructive sleep apnea. She is wearing CPAP compliantly, and is having no issues with her mask fit or pressure. She feels that she is sleeping well with the device, and is satisfied with her sleep and daytime alertness. Of note, her weight is up 12 pounds since last visit.   10/13/15- 48 yoF following for obstructive sleep apnea complicated by obesity, HBP, GERD/Barrett's, chronic respiratory failure with hypoxia, bipolar CPAP 18/ [facility] w O2 2L NPSG 09/05/10  AHI 50.9 per hour, CPAP to 18, body weight 364 pounds  Former [place] pt-Wears CPAP every night for about 6 hours; DME is [facility]-no recent download; has had current CPAP machine for about 2 years.      She describes good compliance and control. CPAP stops her snoring and she sleeps very well. Declines flu vaccine-discussed ENT surgery history-tonsils and adenoids  10/13/2016-50 year old female never smoker followed for OSA, complicated by obesity, HBP, GERD/Barrett's, chronic respiratory failure with hypoxia, bipolar CPAP 18/[facility], O2 2 L FOLLOWS FOR: DME: [facility] Pt wears CPAP nightly for about 6 hours; no complaints. Pt is going to be scheduled for weight loss surgery after the first of the year. Very comfortable with CPAP and describes excellent compliance with improved sleep quality. No active respiratory complaints.  ROS-see HPI   Negative unless "+" Constitutional:    weight loss, night sweats, fevers, chills, fatigue, lassitude. HEENT:    headaches, difficulty swallowing, tooth/dental problems, sore throat,       sneezing, itching, ear ache, nasal congestion, post nasal drip, snoring CV:    chest pain, orthopnea, PND, swelling in lower extremities, anasarca,                                                   dizziness, palpitations Resp:   shortness of breath with exertion or at rest.                productive cough,    non-productive cough, coughing up of blood.              change in color of mucus.  wheezing.   Skin:    rash or lesions. GI:  No-   heartburn, indigestion, abdominal pain, nausea, vomiting,  GU:  MS:   joint pain, stiffness, decreased range of motion, back pain. Neuro-     nothing unusual Psych:  change in mood or affect.  depression or anxiety.   memory loss.  OBJ- Physical Exam General- Alert, Oriented, Affect-appropriate, Distress- none acute, + morbid obesity Skin- rash-none, lesions- none, excoriation- none Lymphadenopathy- none Head- atraumatic            Eyes- Gross vision intact, PERRLA, conjunctivae and secretions clear            Ears- Hearing, canals-normal            Nose- Clear, no-Septal dev, mucus, polyps, erosion, perforation             Throat- Mallampati IV , mucosa clear , drainage- none, tonsils- atrophic Neck- flexible , trachea midline, no stridor , thyroid nl, carotid no bruit Chest - symmetrical excursion , unlabored           Heart/CV- RRR , no murmur , no gallop  , no rub, nl s1 s2                           -  JVD- none , edema- none, stasis changes- none, varices- none           Lung- clear to P&A, wheeze- none, cough- none , dullness-none, rub- none           Chest wall-  Abd-  Br/ Gen/ Rectal- Not done, not indicated Extrem- cyanosis- none, clubbing, none, atrophy- none, strength- nl Neuro- grossly intact to observation

## 2016-10-13 NOTE — Assessment & Plan Note (Signed)
She describes excellent compliance and control with CPAP 18. We will need to get download for documentation. She is instructed to take her CPAP mask when she goes for planned surgery because she will need to use CPAP while sleeping after the procedure.

## 2016-10-13 NOTE — Patient Instructions (Signed)
Order- DME [facility]- please provide CPAP pressure-compliance download            Continue CPAP 18, O2 2L for sleep, mask of choice, humidifier, supplies, AirView if available      Dx OSA  Please call if we can help

## 2016-10-16 ENCOUNTER — Encounter: Payer: Self-pay | Admitting: Internal Medicine

## 2016-10-17 ENCOUNTER — Telehealth: Payer: Self-pay | Admitting: Neurology

## 2016-10-17 MED ORDER — PROPRANOLOL HCL 40 MG PO TABS: 40.0000 mg | ORAL_TABLET | Freq: Two times a day (BID) | ORAL | 1 refills | Status: DC

## 2016-10-17 MED ORDER — GABAPENTIN 300 MG PO CAPS
600.0000 mg | ORAL_CAPSULE | Freq: Two times a day (BID) | ORAL | Status: DC
Start: 2016-10-17 — End: 2017-06-06

## 2016-10-17 NOTE — Telephone Encounter (Signed)
Pt was seen on 09/14/16 for migraines. At this visit, HR was 72 and BP 124/78. Gabapentin was increased to 600 mg in the morning and 900 mg at night. She continued propanolol 60 mg twice per day. HR has ranged between 53 and 72 over the past year when coming for visits at [facility]

## 2016-10-17 NOTE — Telephone Encounter (Signed)
I called patient. The patient has been running heart rates in the 40s, likely related to the propranolol not the gabapentin. We will cut back on the medication taking 40 mg twice daily. The patient is getting a squeezing sensation in the head and a foggy headed sensation after going up on the gabapentin, we will cut back to 600 mg twice daily of this medication.

## 2016-10-17 NOTE — Telephone Encounter (Signed)
Pt called to advise gabapentin (NEURONTIN) 300 MG capsule  Is causing her to feel foggy and she is having on the side of her head. She said this is starting the 3rd week increasing it to 2am and 3 pm. She is also wanting to know if it can decrease heart rate. She advised her pulse has been low - at the Dr last week it was 81 and she gave blood on 11/4, it was 52. Please call

## 2016-10-27 ENCOUNTER — Other Ambulatory Visit: Payer: Self-pay | Admitting: Family Medicine

## 2016-10-29 DIAGNOSIS — R0902 Hypoxemia: Secondary | ICD-10-CM | POA: Diagnosis not present

## 2016-10-29 DIAGNOSIS — G4733 Obstructive sleep apnea (adult) (pediatric): Secondary | ICD-10-CM | POA: Diagnosis not present

## 2016-11-01 ENCOUNTER — Ambulatory Visit (INDEPENDENT_AMBULATORY_CARE_PROVIDER_SITE_OTHER): Payer: Medicare Other

## 2016-11-01 ENCOUNTER — Ambulatory Visit (INDEPENDENT_AMBULATORY_CARE_PROVIDER_SITE_OTHER): Payer: Medicare Other | Admitting: Pharmacist

## 2016-11-01 ENCOUNTER — Ambulatory Visit (INDEPENDENT_AMBULATORY_CARE_PROVIDER_SITE_OTHER): Payer: Medicare Other | Admitting: Family Medicine

## 2016-11-01 ENCOUNTER — Encounter: Payer: Self-pay | Admitting: Family Medicine

## 2016-11-01 ENCOUNTER — Encounter: Payer: Self-pay | Admitting: Pharmacist

## 2016-11-01 VITALS — BP 118/69 | HR 53 | Temp 98.0°F | Ht 64.0 in | Wt 285.8 lb

## 2016-11-01 DIAGNOSIS — J0100 Acute maxillary sinusitis, unspecified: Secondary | ICD-10-CM

## 2016-11-01 DIAGNOSIS — E119 Type 2 diabetes mellitus without complications: Secondary | ICD-10-CM | POA: Diagnosis not present

## 2016-11-01 MED ORDER — AMOXICILLIN-POT CLAVULANATE 875-125 MG PO TABS: 1.0000 | ORAL_TABLET | Freq: Two times a day (BID) | ORAL | 0 refills | Status: DC

## 2016-11-01 NOTE — Patient Instructions (Signed)
Great to meet you!  Finish all antibiotics  Come back with any concerns   Sinusitis, Adult Sinusitis is soreness and inflammation of your sinuses. Sinuses are hollow spaces in the bones around your face. They are located:  Around your eyes.  In the middle of your forehead.  Behind your nose.  In your cheekbones. Your sinuses and nasal passages are lined with a stringy fluid (mucus). Mucus normally drains out of your sinuses. When your nasal tissues get inflamed or swollen, the mucus can get trapped or blocked so air cannot flow through your sinuses. This lets bacteria, viruses, and funguses grow, and that leads to infection. Follow these instructions at home: Medicines  Take, use, or apply over-the-counter and prescription medicines only as told by your doctor. These may include nasal sprays.  If you were prescribed an antibiotic medicine, take it as told by your doctor. Do not stop taking the antibiotic even if you start to feel better. Hydrate and Humidify  Drink enough water to keep your pee (urine) clear or pale yellow.  Use a cool mist humidifier to keep the humidity level in your home above 50%.  Breathe in steam for 10-15 minutes, 3-4 times a day or as told by your doctor. You can do this in the bathroom while a hot shower is running.  Try not to spend time in cool or dry air. Rest  Rest as much as possible.  Sleep with your head raised (elevated).  Make sure to get enough sleep each night. General instructions  Put a warm, moist washcloth on your face 3-4 times a day or as told by your doctor. This will help with discomfort.  Wash your hands often with soap and water. If there is no soap and water, use hand sanitizer.  Do not smoke. Avoid being around people who are smoking (secondhand smoke).  Keep all follow-up visits as told by your doctor. This is important. Contact a doctor if:  You have a fever.  Your symptoms get worse.  Your symptoms do not get  better within 10 days. Get help right away if:  You have a very bad headache.  You cannot stop throwing up (vomiting).  You have pain or swelling around your face or eyes.  You have trouble seeing.  You feel confused.  Your neck is stiff.  You have trouble breathing. This information is not intended to replace advice given to you by your health care provider. Make sure you discuss any questions you have with your health care provider. Document Released: 05/16/2008 Document Revised: 07/24/2016 Document Reviewed: 09/23/2015 Elsevier Interactive Patient Education  2017 [employer].

## 2016-11-01 NOTE — Progress Notes (Signed)
   HPI  Patient presents today here with concern for sinus infection.  Patient is on day 5 of severe sinus pressure and cough. Cough is productive of thick yellow/green sputum. She denies shortness of breath. Today she began developing central chest pain with inspiration. She's tolerating foods and fluids normally. She has severe postnasal drip and nasal congestion which interferes with her CPAP Usage  Using Mucinex, Sudafed, and Flonase, Flonase twice daily.   PMH: Smoking status noted ROS: Per HPI  Objective: BP 118/69   Pulse (!) 53   Temp 98 F (36.7 C) (Oral)   Ht 5\' 4"  (1.626 m)   Wt 285 lb 12.8 oz (129.6 kg)   BMI 49.06 kg/m  Gen: NAD, alert, cooperative with exam HEENT: NCAT, tenderness to palpation of bilateral maxillary sinuses, oropharynx moist, nares with swollen turbinates bilaterally CV: RRR, good S1/S2, no murmur Resp: CTABL, no wheezes, non-labored Ext: No edema, warm Neuro: Alert and oriented, No gross deficits  Assessment and plan:  # Acute maxillary sinusitis Treat with Augmentin, continue aggressive supportive care including Flonase, Mucinex Return to clinic with any concerns or worsening symptoms.    Meds ordered this encounter  Medications  . amoxicillin-clavulanate (AUGMENTIN) 875-125 MG tablet    Sig: Take 1 tablet by mouth 2 (two) times daily.    Dispense:  20 tablet    Refill:  0    Laroy Apple, MD [facility] 11/01/2016, 9:02 AM

## 2016-11-01 NOTE — Progress Notes (Signed)
Patient ID: Morgan [name], female   DOB: [birth year], 50 y.o.   MRN: [id number]    Subjective:     Morgan [name] is a 50 y.o. female who I am asked to see in consultation with Dr Alain Honey for evaluation and treatment of obesity. Morgan [name] is interested in bariatric surgery. Her hopes are to be approved by December 2017. She has met with surgeon at [facility] and also nutritionist.  She is requried by her insurance to complete 6 months physician supervised weight loss visits which started July 05, 2016.  This is her fourth visit with me and she saw Dr Sabra Heck in July 2017. Morgan [name] has been following a low calorie diet and exercising regularly for the last 5 months - see below for further details.  HBG readings = 99 to 133  Patient cites health, increased physical ability, self-image as reasons for wanting to lose weight.  Current Eating Habits Number of regular meals per day: 3 Number of snacking episodes per day: 1 Patient is keeping food journal and bring in today.  Breakfast is usually oatmeal and apple or sausage and egg sandwich.  Lunch and Dinner are similar - meat (cubed steak, deer burger, chicken or [place]) + vegetable (beans, broccoli, [place]ra) + starch (rice, potatoes, bread, pasta). She still sometimes eats a larger amount of CHO's than recommended since she is diabetic. Snacks - clementines, banana, peanut better crackers, cheese stick Who shops for food? patient Who prepares food? patient Who eats with patient? patient Binge behavior?: no Purge behavior? no Anorexic behavior? no Eating precipitated by stress? yes   Guilt feelings associated with eating? yes   Current Exercise Habits - continues to do water aerobics for 60 minutes MWF  Current Outpatient Prescriptions on File Prior to Visit  Medication Sig Dispense Refill  . Cholecalciferol (VITAMIN D) 2000 UNITS CAPS Take 2,000 Units by mouth daily.    . diclofenac sodium (VOLTAREN) 1 % GEL Use  as directed for knee pain    . ezetimibe (ZETIA) 10 MG tablet Take 1 tablet (10 mg total) by mouth daily. 30 tablet 5  . fenofibrate micronized (LOFIBRA) 134 MG capsule TAKE (1) CAPSULE DAILY BEFORE BREAKFAST. 30 capsule 6  . ferrous gluconate (FERGON) 325 MG tablet Take 325 mg by mouth daily with breakfast. Reported on 06/29/2016    . fluticasone (FLONASE) 50 MCG/ACT nasal spray SPRAY 1 SPRAY IN EACH NOSTRIL TWICE DAILY. SHAKE GENTLY BEFORE EACH [place] E. 16 g 1  . furosemide (LASIX) 20 MG tablet Take 1 tablet (20 mg total) by mouth daily. 30 tablet 6  . gabapentin (NEURONTIN) 300 MG capsule Take 2 capsules (600 mg total) by mouth 2 (two) times daily. (Patient taking differently: Take 300 mg by mouth 2 (two) times daily. )    . glucose blood (ONE TOUCH ULTRA TEST) test strip CHECK BLOOD SUGAR TWICE A DAY AS DIRECTED 100 each 3  . levothyroxine (SYNTHROID, LEVOTHROID) 150 MCG tablet Take 1 tablet (150 mcg total) by mouth daily. 30 tablet 6  . linaclotide (LINZESS) 145 MCG CAPS capsule TAKE (1) CAPSULE DAILY 30 capsule 6  . loratadine (CLARITIN) 10 MG tablet Take 10 mg by mouth daily as needed. Prn     . metFORMIN (GLUCOPHAGE) 500 MG tablet Take 1 tablet (500 mg total) by mouth every evening. 30 tablet 6  . Multiple Vitamin (MULTIVITAMIN) capsule Take 1 capsule by mouth daily.      Marland Kitchen nystatin (MYCOSTATIN/NYSTOP) powder Apply topically  2 (two) times daily as needed. 15 g 1  . omeprazole (PRILOSEC) 40 MG capsule TAKE (1) CAPSULE DAILY 30 capsule 6  . PAZEO 0.7 % SOLN Place 1 drop into both eyes every morning.    . potassium chloride (K-DUR) 10 MEQ tablet Take 1 tablet (10 mEq total) by mouth daily. 30 tablet 6  . Probiotic Product (HEALTHY COLON PO) Take 2 capsules by mouth daily.     . propranolol (INDERAL) 40 MG tablet Take 1 tablet (40 mg total) by mouth 2 (two) times daily. 180 tablet 1  . RESTASIS MULTIDOSE 0.05 % ophthalmic emulsion Place 1 drop into both eyes 2 (two) times daily.    . sertraline  (ZOLOFT) 100 MG tablet Take 2 tablets (200 mg total) by mouth daily. 60 tablet 6  . SUMAtriptan (IMITREX) 6 MG/0.5ML SOSY injection Inject 0.5 mLs (6 mg total) into the skin every 2 (two) hours as needed for migraine or headache. 8 vial 5  . tiZANidine (ZANAFLEX) 2 MG tablet Take 1 tablet (2 mg total) by mouth 3 (three) times daily. 90 tablet 5  . traMADol (ULTRAM) 50 MG tablet Take 50 mg by mouth 2 (two) times daily.     . valsartan-hydrochlorothiazide (DIOVAN-HCT) 160-25 MG tablet Take 1 tablet by mouth daily. 30 tablet 6  . ziprasidone (GEODON) 60 MG capsule TAKE 1 CAPSULE IN THE EVENING 30 capsule 6   No current facility-administered medications on file prior to visit.     Other Potential Contributing Factors Use of alcohol: average 0 drinks/week Use of medications that may cause weight gain antipsychotics (ziprasidone / Geodon) and SSRI   Psych History: bipolar Comorbidities: diabetes mellitus, dyslipidemias, GERD, hypertension, osteoarthritis, sleep apnea/hypopnea and NASH The following portions of the patient's history were reviewed and updated as appropriate: allergies, current medications, past family history, past medical history, past social history, past surgical history and problem list.    Objective:    BP 102/68   Pulse 66   Wt 287 lb (130.2 kg)   BMI 49.26 kg/m  Body mass index is 49.26 kg/m.  Weight has decreased by 6# over last month and has lost a total of 23# since July 2017  Lab Review  Last A1c = 6.5% (09/29/2016) AST = 17 (09/29/2016) ALT = 15 (09/29/2016)  Last Lipid Panel (10/19/2017_ Tg = 179 HDL = 32 LDL = not able to calculate   previous Labs:  A1c = 7.2% (07/05/2016) AST = 42 (07/05/2016) ALT = 40 (07/05/2016)  Last Lipid Panel (07/05/2016) Tg = 450 HDL = 42 LDL = 122    Assessment:    Obesity with BMI and comorbidities as noted above - has lost 23# Contraindications to weight loss: none Patient readiness to commit to diet and  activity changes: excellent Barriers to weight loss: limited income and stress      Plan:  1.  Continue current medications - no changes made today 2. General patient education ('Yes' if discussed, 'No' if not) Initial target weight loss is 10% of weight.  Patient's goal is to lose 30lbs over 6 months. She has almost met her goal. 3. Diet interventions: Continue low calorie (1000 kCal/d) deficit diet Proper food choices reviewed: yes - discussed limiting CHO intake and reviewed serving size recommendatiosn Preparation techniques reviewed: yes Careful meal planning; avoiding ad hoc eating: yes Stimulus control to control unhealthy eating: yes  4. Exercise intervention: Formal exercise regimen: yes -Continue 3 days per week water aerobics and try to add weight  training along with this. 5. Patient to continue to keep a food and exercise diary  6. Follow up: 4 weeks and as needed.     Above information was reviewed and discussed with patient's PCP - Dr Alain Honey

## 2016-11-15 ENCOUNTER — Telehealth: Payer: Self-pay

## 2016-11-15 NOTE — Telephone Encounter (Signed)
-----   Message from Nelida Meuse III, MD sent at 11/14/2016  4:25 PM EST ----- Regarding: RE: EGD Christy,    I am not sure how this message was directed to me in particular, as I cannot find records of having seen this patient in the past.  I do not see any pre-colonoscopy visits with our nurses either, so I do not know what to say about reports of a canceled colonoscopy. That said, I will be helpful if I can be.  Her BMI requires that the EGD be done at the [facility] outpatient endoscopy dept.  Her last documented colonoscopy report from another institution from 9/17 had no polyps and recommended a repeat in 5 years for family history colon cancer in sister.  So she is not due for that yet.  Of note, there was no Barrett's reported on an EGD at that time either. My next available [facility] outpatient procedure is most likely in January or February because we do not have much available block time there. I see that the patient had a favorable cardiology pre-op evaluation.  Gregary Signs,  Please communicate with the bariatric coordinator above. If they want this patient to have an EGD, please make the arrangements. - H. Danis ----- Message ----- From: Ivor Costa Sent: 11/14/2016   2:02 PM To: Doran Stabler, MD Subject: EGD                                            Dr. Loletha Carrow,  Patient was seen and evaluated for Weight Loss Surgery by Dr. Greer Pickerel on 07/14/2016.  Dr. Redmond Pulling has recommended that Ms. Geraldo have a repeat EGD to Evaluate Reflux Disease and rule out Barrett's Esophagus.  Patient stated during her initial consultation that she was scheduled for a Colonoscopy in September however, it appears the appointment was cancelled.  Ms. Michels will need to have an EGD prior to proceeding with Weight Loss Surgery.    Thank you,  Ivor Costa, [facility]  [facility] [phone] (office)  [phone] (fax)

## 2016-11-15 NOTE — Telephone Encounter (Signed)
Left message for Ivor Costa to call me back.

## 2016-11-28 DIAGNOSIS — G4733 Obstructive sleep apnea (adult) (pediatric): Secondary | ICD-10-CM | POA: Diagnosis not present

## 2016-11-28 DIAGNOSIS — R0902 Hypoxemia: Secondary | ICD-10-CM | POA: Diagnosis not present

## 2016-12-07 ENCOUNTER — Ambulatory Visit (INDEPENDENT_AMBULATORY_CARE_PROVIDER_SITE_OTHER): Payer: Medicare Other | Admitting: Pharmacist

## 2016-12-07 ENCOUNTER — Encounter: Payer: Self-pay | Admitting: Pharmacist

## 2016-12-07 VITALS — BP 110/62 | HR 56 | Ht 64.0 in | Wt 283.0 lb

## 2016-12-07 DIAGNOSIS — E782 Mixed hyperlipidemia: Secondary | ICD-10-CM

## 2016-12-07 DIAGNOSIS — E119 Type 2 diabetes mellitus without complications: Secondary | ICD-10-CM

## 2016-12-07 DIAGNOSIS — E538 Deficiency of other specified B group vitamins: Secondary | ICD-10-CM

## 2016-12-07 MED ORDER — CYANOCOBALAMIN 1000 MCG/ML IJ SOLN
1000.0000 ug | INTRAMUSCULAR | Status: DC
  Administered 2016-12-07 – 2018-04-09 (×16): 1000 ug via INTRAMUSCULAR

## 2016-12-07 NOTE — Patient Instructions (Addendum)
Continue with low calorie diet - eating lean proteins and lots of vegetables and fruits.   Continue with water aerobics - goal is at least 150 minutes of exercise per week.   Add weight training along with water aerobics  Continue to keep food journal  We will send records and information to [facility] after your visit with Dr Sabra Heck 01/04/17

## 2016-12-07 NOTE — Progress Notes (Signed)
Patient ID: Morgan [name], female   DOB: [birth year], 50 y.o.   MRN: [id number]   Subjective:     Morgan [name] is a 50 y.o. female who I am asked to see in consultation with Dr Alain Honey for evaluation and treatment of obesity. Patient cites health, increased physical ability, self-image as reasons for wanting to lose weight. Morgan [name] is interested in bariatric surgery and has met with surgeon, nutritionist and staff at [facility]. She is required by her insurance to complete 6 months physician supervised weight loss visits which started July 05, 2016.  This is her fifth visit with me since she saw Dr Sabra Heck in July 2017. [name] has been following a low calorie diet and exercising regularly for the last 6 months - see below for further details.  Depression screen [facility] 2/9 12/07/2016 11/01/2016 11/01/2016 09/29/2016 07/20/2016  Decreased Interest '2 1 1 1 1  ' Down, Depressed, Hopeless '2 1 1 1 1  ' PHQ - 2 Score '4 2 2 2 2  ' Altered sleeping 1 0 0 1 -  Tired, decreased energy '2 1 1 1 ' -  Change in appetite 0 0 0 0 -  Feeling bad or failure about yourself  '1 1 1 1 ' -  Trouble concentrating 1 0 0 0 -  Moving slowly or fidgety/restless 0 0 0 0 -  Suicidal thoughts 0 0 0 0 -  PHQ-9 Score '9 4 4 5 ' -  Difficult doing work/chores Somewhat difficult - Not difficult at all Not difficult at all -  Some recent data might be hidden   Depression score is higher than previous due to patient's sister died 2017/01/01.  She continues with her treatment and report she is handling OK.  HBG readings = 95 to 130 Checking BG once daily  Current Eating Habits Number of regular meals per day: 3 Number of snacking episodes per day: 1 Patient is keeping food journal.  Reviewed with her today in office: Breakfast is usually bacon or sausage and egg sandwich or liver pudding sandwich Lunch and Dinner are similar - meat (chicken, roast beef, meatloaf or ground venison) + vegetable (beans,  broccoli, Okra or cauliflower) + sometimes a starch (potatoes or pasta).  Snacks - clementines or nuts Continues to do a great job of limiting sugar in diet. Who shops for food? patient Who prepares food? patient Who eats with patient? patient Binge behavior?: no Purge behavior? no Anorexic behavior? no Eating precipitated by stress? yes   Guilt feelings associated with eating? yes   Current Exercise Habits - continues to do water aerobics for 60 minutes 2 or 3 times per week  Current Outpatient Prescriptions on File Prior to Visit  Medication Sig Dispense Refill  . Cholecalciferol (VITAMIN D) 2000 UNITS CAPS Take 2,000 Units by mouth daily.    . diclofenac sodium (VOLTAREN) 1 % GEL Use as directed for knee pain    . ezetimibe (ZETIA) 10 MG tablet Take 1 tablet (10 mg total) by mouth daily. 30 tablet 5  . fenofibrate micronized (LOFIBRA) 134 MG capsule TAKE (1) CAPSULE DAILY BEFORE BREAKFAST. 30 capsule 6  . ferrous gluconate (FERGON) 325 MG tablet Take 325 mg by mouth daily with breakfast. Reported on 06/29/2016    . fluticasone (FLONASE) 50 MCG/ACT nasal spray SPRAY 1 SPRAY IN EACH NOSTRIL TWICE DAILY. SHAKE GENTLY BEFORE EACH [place] E. 16 g 1  . furosemide (LASIX) 20 MG tablet Take 1 tablet (20 mg total) by mouth daily.  30 tablet 6  . gabapentin (NEURONTIN) 300 MG capsule Take 2 capsules (600 mg total) by mouth 2 (two) times daily. (Patient taking differently: Take 300 mg by mouth 2 (two) times daily. )    . glucose blood (ONE TOUCH ULTRA TEST) test strip CHECK BLOOD SUGAR TWICE A DAY AS DIRECTED 100 each 3  . levothyroxine (SYNTHROID, LEVOTHROID) 150 MCG tablet Take 1 tablet (150 mcg total) by mouth daily. 30 tablet 6  . linaclotide (LINZESS) 145 MCG CAPS capsule TAKE (1) CAPSULE DAILY 30 capsule 6  . loratadine (CLARITIN) 10 MG tablet Take 10 mg by mouth daily as needed. Prn     . metFORMIN (GLUCOPHAGE) 500 MG tablet Take 1 tablet (500 mg total) by mouth every evening. 30 tablet 6  .  Multiple Vitamin (MULTIVITAMIN) capsule Take 1 capsule by mouth daily.      Marland Kitchen nystatin (MYCOSTATIN/NYSTOP) powder Apply topically 2 (two) times daily as needed. 15 g 1  . omeprazole (PRILOSEC) 40 MG capsule TAKE (1) CAPSULE DAILY 30 capsule 6  . PAZEO 0.7 % SOLN Place 1 drop into both eyes every morning.    . potassium chloride (K-DUR) 10 MEQ tablet Take 1 tablet (10 mEq total) by mouth daily. 30 tablet 6  . Probiotic Product (HEALTHY COLON PO) Take 2 capsules by mouth daily.     . propranolol (INDERAL) 40 MG tablet Take 1 tablet (40 mg total) by mouth 2 (two) times daily. 180 tablet 1  . RESTASIS MULTIDOSE 0.05 % ophthalmic emulsion Place 1 drop into both eyes 2 (two) times daily.    . sertraline (ZOLOFT) 100 MG tablet Take 2 tablets (200 mg total) by mouth daily. 60 tablet 6  . SUMAtriptan (IMITREX) 6 MG/0.5ML SOSY injection Inject 0.5 mLs (6 mg total) into the skin every 2 (two) hours as needed for migraine or headache. 8 vial 5  . tiZANidine (ZANAFLEX) 2 MG tablet Take 1 tablet (2 mg total) by mouth 3 (three) times daily. 90 tablet 5  . traMADol (ULTRAM) 50 MG tablet Take 50 mg by mouth 2 (two) times daily.     . valsartan-hydrochlorothiazide (DIOVAN-HCT) 160-25 MG tablet Take 1 tablet by mouth daily. 30 tablet 6  . ziprasidone (GEODON) 60 MG capsule TAKE 1 CAPSULE IN THE EVENING 30 capsule 6   No current facility-administered medications on file prior to visit.     Other Potential Contributing Factors Use of alcohol: average 0 drinks/week Use of medications that may cause weight gain antipsychotics (ziprasidone / Geodon) and SSRI   Psych History: bipolar Comorbidities: diabetes mellitus, dyslipidemias, GERD, hypertension, osteoarthritis, sleep apnea/hypopnea and NASH The following portions of the patient's history were reviewed and updated as appropriate: allergies, current medications, past family history, past medical history, past social history, past surgical history and problem  list.    Objective:    BP 110/62   Pulse (!) 56   Ht '5\' 4"'  (1.626 m)   Wt 283 lb (128.4 kg)   BMI 48.58 kg/m  Body mass index is 48.58 kg/m.  Weight has decreased by 3# over last month and has lost a total of 26# since July 2017  Lab Review  Last A1c = 6.5% (09/29/2016) AST = 17 (09/29/2016) ALT = 15 (09/29/2016)  Last Lipid Panel (09/29/2016) Tg = 179 HDL = 32 Direct LDL = 122   previous Labs:  A1c = 7.2% (07/05/2016) AST = 42 (07/05/2016) ALT = 40 (07/05/2016)  Last Lipid Panel (07/05/2016) Tg = 450 HDL =  42 LDL = unable to calculate due to elevated Tg    Assessment:    Obesity with BMI and comorbidities as noted above - has lost 26# over last 6 months Contraindications to weight loss: none Patient readiness to commit to diet and activity changes: excellent Barriers to weight loss: limited income and stress  Depression - worse over last 1 week due to death of sister. B12 Deficiency - due next monthly injection Mixed dyslipidemia - intolerant to statins, taking fenofibrate     Plan:  1.  Continue current medications - no changes made today  Recheck lipids next month - if Tg below 150 then consider decreasing dose of fenofibrate  Maybe trial of low dose statin 2. General patient education ('Yes' if discussed, 'No' if not) Initial target weight loss is 10% of weight.  Patient's goal is to lose 30lbs over 6 months. She has almost met her goal. 3. Diet interventions: Continue low calorie (1000 kCal/d) deficit diet Proper food choices reviewed: yes - discussed limiting CHO intake and reviewed serving size recommendations.  Recommended choosing leaner proteins - decrease higher fat meats such as sausage, liver pudding and bacon Preparation techniques reviewed: yes Careful meal planning; avoiding ad hoc eating: yes Stimulus control to control unhealthy eating: yes  4. Exercise intervention: Formal exercise regimen: yes -Continue 3 days per week water aerobics  and try to add weight training along with this. 5. Patient to continue to keep a food diary  6. b12 injection given in office today 7.  Follow up: 4 weeks to see Dr Sabra Heck.    Above information was reviewed and discussed with patient's PCP - Dr Alain Honey

## 2016-12-29 DIAGNOSIS — R0902 Hypoxemia: Secondary | ICD-10-CM | POA: Diagnosis not present

## 2016-12-29 DIAGNOSIS — G4733 Obstructive sleep apnea (adult) (pediatric): Secondary | ICD-10-CM | POA: Diagnosis not present

## 2017-01-03 NOTE — Progress Notes (Signed)
Subjective:    Patient ID: Morgan [name], female    DOB: 30-Jul-1966, 51 y.o.   MRN: [id number]  HPI 51 year old female with obesity and related problems. Since last July she has been on a weight loss program restricting carbohydrates. She has had good success losing from 310 pounds in July 2 today's weight of 277.4. She feels much better overall. Her sugars are improved as are her lipids. After this 6 months weight loss on her own she will see surgeon again to discuss possible bariatric surgery.  Patient Active Problem List   Diagnosis Date Noted  . Intractable chronic migraine without aura 06/04/2015  . Pancreatitis 02/27/2014  . Fatty liver disease, nonalcoholic [id number]  . Bipolar disorder ([place]) 02/20/2014  . Metabolic syndrome [id number]  . Vitamin D deficiency   . Morbid obesity ([place]) 03/12/2013  . DM (diabetes mellitus) ([place]) 03/12/2013  . HLD (hyperlipidemia) 03/12/2013  . Preoperative evaluation to rule out surgical contraindication 06/12/2011  . Hypothyroidism   . Constipation, chronic   . Vitamin B 12 deficiency   . ALLERGIC RHINITIS 12/31/2010  . BARRETTS ESOPHAGUS 12/31/2010  . Gastroparesis 12/31/2010  . OSTEOARTHRITIS 12/31/2010  . Obstructive sleep apnea 09/27/2010  . Migraine 09/27/2010  . Essential hypertension 09/27/2010  . RESPIRATORY FAILURE, CHRONIC 09/27/2010   Outpatient Encounter Prescriptions as of 01/04/2017  Medication Sig  . Cholecalciferol (VITAMIN D) 2000 UNITS CAPS Take 2,000 Units by mouth daily.  . diclofenac sodium (VOLTAREN) 1 % GEL Use as directed for knee pain  . ezetimibe (ZETIA) 10 MG tablet Take 1 tablet (10 mg total) by mouth daily.  . fenofibrate micronized (LOFIBRA) 134 MG capsule TAKE (1) CAPSULE DAILY BEFORE BREAKFAST.  . ferrous gluconate (FERGON) 325 MG tablet Take 325 mg by mouth daily with breakfast. Reported on 06/29/2016  . fluticasone (FLONASE) 50 MCG/ACT nasal spray SPRAY 1 SPRAY IN EACH NOSTRIL TWICE DAILY. SHAKE GENTLY  BEFORE EACH [place] E.  . furosemide (LASIX) 20 MG tablet Take 1 tablet (20 mg total) by mouth daily.  Marland Kitchen gabapentin (NEURONTIN) 300 MG capsule Take 2 capsules (600 mg total) by mouth 2 (two) times daily. (Patient taking differently: Take 300 mg by mouth 2 (two) times daily. )  . glucose blood (ONE TOUCH ULTRA TEST) test strip CHECK BLOOD SUGAR TWICE A DAY AS DIRECTED  . levothyroxine (SYNTHROID, LEVOTHROID) 150 MCG tablet Take 1 tablet (150 mcg total) by mouth daily.  Marland Kitchen linaclotide (LINZESS) 145 MCG CAPS capsule TAKE (1) CAPSULE DAILY  . loratadine (CLARITIN) 10 MG tablet Take 10 mg by mouth daily as needed. Prn   . metFORMIN (GLUCOPHAGE) 500 MG tablet Take 1 tablet (500 mg total) by mouth every evening.  . Multiple Vitamin (MULTIVITAMIN) capsule Take 1 capsule by mouth daily.    Marland Kitchen nystatin (MYCOSTATIN/NYSTOP) powder Apply topically 2 (two) times daily as needed.  Marland Kitchen omeprazole (PRILOSEC) 40 MG capsule TAKE (1) CAPSULE DAILY  . PAZEO 0.7 % SOLN Place 1 drop into both eyes every morning.  . potassium chloride (K-DUR) 10 MEQ tablet Take 1 tablet (10 mEq total) by mouth daily.  . Probiotic Product (HEALTHY COLON PO) Take 2 capsules by mouth daily.   . propranolol (INDERAL) 40 MG tablet Take 1 tablet (40 mg total) by mouth 2 (two) times daily.  . RESTASIS MULTIDOSE 0.05 % ophthalmic emulsion Place 1 drop into both eyes 2 (two) times daily.  . sertraline (ZOLOFT) 100 MG tablet Take 2 tablets (200 mg total) by mouth daily.  . SUMAtriptan (  IMITREX) 6 MG/0.5ML SOSY injection Inject 0.5 mLs (6 mg total) into the skin every 2 (two) hours as needed for migraine or headache.  Marland Kitchen tiZANidine (ZANAFLEX) 2 MG tablet Take 1 tablet (2 mg total) by mouth 3 (three) times daily.  . traMADol (ULTRAM) 50 MG tablet Take 50 mg by mouth 2 (two) times daily.   . valsartan-hydrochlorothiazide (DIOVAN-HCT) 160-25 MG tablet Take 1 tablet by mouth daily.  . ziprasidone (GEODON) 60 MG capsule TAKE 1 CAPSULE IN THE EVENING    Facility-Administered Encounter Medications as of 01/04/2017  Medication  . cyanocobalamin ((VITAMIN B-12)) injection 1,000 mcg      Review of Systems  Constitutional: Negative.   HENT: Negative.   Respiratory: Negative.   Cardiovascular: Negative.   Musculoskeletal: Positive for arthralgias.  Neurological: Negative.   Psychiatric/Behavioral: Negative.        Objective:   Physical Exam  Constitutional: She is oriented to person, place, and time. She appears well-developed and well-nourished.  Cardiovascular: Normal rate, regular rhythm and normal heart sounds.   Pulmonary/Chest: Effort normal and breath sounds normal.  Musculoskeletal:  Left knee has some swelling and crepitance  Neurological: She is alert and oriented to person, place, and time.  Psychiatric: She has a normal mood and affect. Her behavior is normal.   BP 122/74   Pulse 62   Temp 98.3 F (36.8 C) (Oral)   Ht 5\' 4"  (1.626 m)   Wt 277 lb 6.4 oz (125.8 kg)   LMP 12/05/2016   BMI 47.62 kg/m         Assessment & Plan:  1. Essential hypertension Blood pressure is 122/74 today. This is well controlled on current regimen  2. Hypothyroidism, unspecified type We last assessed TSH in July was normal  3. Type 2 diabetes mellitus without complication, without long-term current use of insulin (HCC) Expect A1c to be at goal or less based on her home testing and weight loss and adherence to low carb diet - Bayer DCA Hb A1c Waived  Wardell Honour MD

## 2017-01-04 ENCOUNTER — Encounter: Payer: Self-pay | Admitting: Family Medicine

## 2017-01-04 ENCOUNTER — Ambulatory Visit (INDEPENDENT_AMBULATORY_CARE_PROVIDER_SITE_OTHER): Payer: Medicare Other | Admitting: Family Medicine

## 2017-01-04 VITALS — BP 122/74 | HR 62 | Temp 98.3°F | Ht 64.0 in | Wt 277.4 lb

## 2017-01-04 DIAGNOSIS — E039 Hypothyroidism, unspecified: Secondary | ICD-10-CM

## 2017-01-04 DIAGNOSIS — I10 Essential (primary) hypertension: Secondary | ICD-10-CM | POA: Diagnosis not present

## 2017-01-04 DIAGNOSIS — E119 Type 2 diabetes mellitus without complications: Secondary | ICD-10-CM

## 2017-01-04 LAB — BAYER DCA HB A1C WAIVED: HB A1C (BAYER DCA - WAIVED): 6 % (ref <7.0)

## 2017-01-10 ENCOUNTER — Ambulatory Visit (INDEPENDENT_AMBULATORY_CARE_PROVIDER_SITE_OTHER): Payer: Medicare Other | Admitting: *Deleted

## 2017-01-10 DIAGNOSIS — E538 Deficiency of other specified B group vitamins: Secondary | ICD-10-CM | POA: Diagnosis not present

## 2017-01-10 NOTE — Progress Notes (Signed)
Pt given Vit B12 inj Tolerated well 

## 2017-01-17 ENCOUNTER — Telehealth: Payer: Self-pay | Admitting: *Deleted

## 2017-01-17 ENCOUNTER — Ambulatory Visit: Payer: Medicare Other | Admitting: Adult Health

## 2017-01-17 NOTE — Telephone Encounter (Addendum)
Left message for patient to call our office to reschedule.  Morgan [name] spoke with her earlier this morning to cancel her appt with Jinny Blossom, who is out sick today.

## 2017-01-18 ENCOUNTER — Telehealth: Payer: Self-pay

## 2017-01-18 NOTE — Telephone Encounter (Signed)
12/19/16 No EGD needed per [facility] in Dr. Dois Davenport office.

## 2017-01-19 NOTE — Progress Notes (Signed)
  Pre-Op Assessment Visit:  Pre-Operative RYGB Surgery  Medical Nutrition Therapy:  Appt start time: [id number]   End time:  1000.  Patient was seen on 07/20/2016 for Pre-Operative Nutrition Assessment. Assessment and letter of approval faxed to [facility] Bariatric Surgery Program coordinator on 07/20/2016.  24 hr Dietary Recall: First Meal: cereal Snack: fruit Second Meal: soup Snack: chips Third Meal: chili Snack: ice cream Beverages: water, unsweet tea  Wt: 312.5 pounds BMI: 53.7  Preferred Learning Style:   No preference indicated   Learning Readiness:   Ready  Handouts given during visit include:  Pre-Op Goals Bariatric Surgery Protein Shakes   During the appointment today the following Pre-Op Goals were reviewed with the patient: Maintain or lose weight as instructed by your surgeon Make healthy food choices Begin to limit portion sizes Limited concentrated sugars and fried foods Keep fat/sugar in the single digits per serving on food labels Practice CHEWING your food  (aim for 30 chews per bite or until applesauce consistency) Practice not drinking 15 minutes before, during, and 30 minutes after each meal/snack Avoid all carbonated beverages  Avoid/limit caffeinated beverages  Avoid all sugar-sweetened beverages Consume 3 meals per day; eat every 3-5 hours Make a list of non-food related activities Aim for 64-100 ounces of FLUID daily  Aim for at least 60-80 grams of PROTEIN daily Look for a liquid protein source that contain ?15 g protein and ?5 g carbohydrate  (ex: shakes, drinks, shots)  Patient-Centered Goals: Have knee surgery, be more active with nieces and nephews Encouraged to engage in 30 minutes of moderate physical activity including cardiovascular and weight baring weekly  8 confidence/10 importance -Follow diet recommendations listed below   Energy and Macronutrient Recomendations: Calories: 1500 Carbohydrate: 170 Protein: 112 Fat:  42  Demonstrated degree of understanding via:  Teach Back  Teaching Method Utilized:  Visual Auditory Hands on  Barriers to learning/adherence to lifestyle change: none  [facility] to [facility] the [facility] to enroll in Pre-Op and Post-Op Nutrition Education when surgery date is scheduled.

## 2017-01-25 DIAGNOSIS — M170 Bilateral primary osteoarthritis of knee: Secondary | ICD-10-CM | POA: Diagnosis not present

## 2017-01-29 DIAGNOSIS — G4733 Obstructive sleep apnea (adult) (pediatric): Secondary | ICD-10-CM | POA: Diagnosis not present

## 2017-01-29 DIAGNOSIS — R0902 Hypoxemia: Secondary | ICD-10-CM | POA: Diagnosis not present

## 2017-01-31 ENCOUNTER — Encounter: Payer: Self-pay | Admitting: *Deleted

## 2017-02-09 ENCOUNTER — Ambulatory Visit (INDEPENDENT_AMBULATORY_CARE_PROVIDER_SITE_OTHER): Payer: Medicare Other | Admitting: *Deleted

## 2017-02-09 DIAGNOSIS — E538 Deficiency of other specified B group vitamins: Secondary | ICD-10-CM

## 2017-02-09 NOTE — Progress Notes (Signed)
Pt given Vit B12 inj Tolerated well 

## 2017-02-26 DIAGNOSIS — G4733 Obstructive sleep apnea (adult) (pediatric): Secondary | ICD-10-CM | POA: Diagnosis not present

## 2017-02-26 DIAGNOSIS — R0902 Hypoxemia: Secondary | ICD-10-CM | POA: Diagnosis not present

## 2017-03-02 ENCOUNTER — Ambulatory Visit (INDEPENDENT_AMBULATORY_CARE_PROVIDER_SITE_OTHER): Payer: Medicare Other | Admitting: Adult Health

## 2017-03-02 ENCOUNTER — Encounter: Payer: Self-pay | Admitting: Adult Health

## 2017-03-02 VITALS — BP 122/76 | HR 52 | Wt 269.4 lb

## 2017-03-02 DIAGNOSIS — G43719 Chronic migraine without aura, intractable, without status migrainosus: Secondary | ICD-10-CM

## 2017-03-02 NOTE — Progress Notes (Signed)
Surgery on 03/21/17.  Proep on 03/07/17-  Need orders in epic  Thank You.

## 2017-03-02 NOTE — Patient Instructions (Signed)
Titrate Gabapentin to 600 mg twice a day. Increase to 2 tablets in the morning and 1 tablet in the evening for 1 week and then increase 2 tablets twice a day.  We will refer for botox If your symptoms worsen or you develop new symptoms please let us know.

## 2017-03-02 NOTE — Progress Notes (Signed)
PATIENT: Morgan [name] DOB: 04-Oct-1966  REASON FOR VISIT: follow up- migraine headache HISTORY FROM: patient  HISTORY OF PRESENT ILLNESS: Morgan [name] is a 51 year old female with a history of intractable migraine headaches. She returns today for follow-up. She states that she confuses Morgan [name] instructions regarding gabapentin. Instead of taking gabapentin 600 twice a day she was only taking 600 daily. She's been having 15-17 headaches a month. Her headaches typically start in the occipital region and spread throughout the head. She does have photophobia and phonophobia but denies nausea and vomiting. She states that she does see spots in the right periphery. She states in the past she did receive Botox and received some benefit however she can no longer afford this. She states that she has been under increased stress last several months due to her sister's death. She returns today for evaluation.  HISTORY 09/14/16: Morgan [name] is a 51 year old right-handed white female with a history of morbid obesity and intractable migraine headaches. The patient has been placed on gabapentin and she is on propranolol for headaches. She has had some improvement in her headache frequency and severity since that time. She is currently having 8-12 headaches a month, 3 or 4 these headaches are incapacitating. The patient may go up to a week without any headaches. The patient is now on injectable Imitrex which seems to be more effective than the oral Maxalt. She in the past has been on Botox treatments, she has had difficulty affording the therapy and had to stop the medication. The Botox was effective. The patient is considering bariatric surgery for her obesity. She returns to the office today for an evaluation.  REVIEW OF SYSTEMS: Out of a complete 14 system review of symptoms, the patient complains only of the following symptoms, and all other reviewed systems are negative.  Headache, depression,  nervous/anxious, anemia, environmental allergies, joint pain, neck pain, apnea, constipation, light sensitivity  ALLERGIES: Allergies  Allergen Reactions  . Codeine     REACTION: increased BP  . Ketoconazole     REACTION: hives, swelling  . Pravachol [Pravastatin Sodium] Swelling    Throat swelling  . Statins     REACTION: leg crapms    HOME MEDICATIONS: Outpatient Medications Prior to Visit  Medication Sig Dispense Refill  . Cholecalciferol (VITAMIN D) 2000 UNITS CAPS Take 2,000 Units by mouth daily.    . diclofenac sodium (VOLTAREN) 1 % GEL Use as directed for knee pain    . ezetimibe (ZETIA) 10 MG tablet Take 1 tablet (10 mg total) by mouth daily. 30 tablet 5  . fenofibrate micronized (LOFIBRA) 134 MG capsule TAKE (1) CAPSULE DAILY BEFORE BREAKFAST. 30 capsule 6  . ferrous gluconate (FERGON) 325 MG tablet Take 325 mg by mouth daily with breakfast. Reported on 06/29/2016    . fluticasone (FLONASE) 50 MCG/ACT nasal spray SPRAY 1 SPRAY IN EACH NOSTRIL TWICE DAILY. SHAKE GENTLY BEFORE EACH [place] E. 16 g 1  . furosemide (LASIX) 20 MG tablet Take 1 tablet (20 mg total) by mouth daily. 30 tablet 6  . gabapentin (NEURONTIN) 300 MG capsule Take 2 capsules (600 mg total) by mouth 2 (two) times daily. (Patient taking differently: Take 300 mg by mouth 2 (two) times daily. )    . glucose blood (ONE TOUCH ULTRA TEST) test strip CHECK BLOOD SUGAR TWICE A DAY AS DIRECTED 100 each 3  . levothyroxine (SYNTHROID, LEVOTHROID) 150 MCG tablet Take 1 tablet (150 mcg total) by mouth daily. 30 tablet 6  .  linaclotide (LINZESS) 145 MCG CAPS capsule TAKE (1) CAPSULE DAILY 30 capsule 6  . loratadine (CLARITIN) 10 MG tablet Take 10 mg by mouth daily as needed. Prn     . metFORMIN (GLUCOPHAGE) 500 MG tablet Take 1 tablet (500 mg total) by mouth every evening. 30 tablet 6  . Multiple Vitamin (MULTIVITAMIN) capsule Take 1 capsule by mouth daily.      Marland Kitchen nystatin (MYCOSTATIN/NYSTOP) powder Apply topically 2 (two)  times daily as needed. 15 g 1  . omeprazole (PRILOSEC) 40 MG capsule TAKE (1) CAPSULE DAILY 30 capsule 6  . PAZEO 0.7 % SOLN Place 1 drop into both eyes every morning.    . potassium chloride (K-DUR) 10 MEQ tablet Take 1 tablet (10 mEq total) by mouth daily. 30 tablet 6  . Probiotic Product (HEALTHY COLON PO) Take 2 capsules by mouth daily.     . propranolol (INDERAL) 40 MG tablet Take 1 tablet (40 mg total) by mouth 2 (two) times daily. 180 tablet 1  . RESTASIS MULTIDOSE 0.05 % ophthalmic emulsion Place 1 drop into both eyes 2 (two) times daily.    . sertraline (ZOLOFT) 100 MG tablet Take 2 tablets (200 mg total) by mouth daily. 60 tablet 6  . SUMAtriptan (IMITREX) 6 MG/0.5ML SOSY injection Inject 0.5 mLs (6 mg total) into the skin every 2 (two) hours as needed for migraine or headache. 8 vial 5  . tiZANidine (ZANAFLEX) 2 MG tablet Take 1 tablet (2 mg total) by mouth 3 (three) times daily. 90 tablet 5  . traMADol (ULTRAM) 50 MG tablet Take 50 mg by mouth 2 (two) times daily.     . valsartan-hydrochlorothiazide (DIOVAN-HCT) 160-25 MG tablet Take 1 tablet by mouth daily. 30 tablet 6  . ziprasidone (GEODON) 60 MG capsule TAKE 1 CAPSULE IN THE EVENING 30 capsule 6   Facility-Administered Medications Prior to Visit  Medication Dose Route Frequency Provider Last Rate Last Dose  . cyanocobalamin ((VITAMIN B-12)) injection 1,000 mcg  1,000 mcg Intramuscular Q30 days Wardell Honour, MD   1,000 mcg at 02/09/17 0355    PAST MEDICAL HISTORY: Past Medical History:  Diagnosis Date  . Allergic rhinitis   . Barrett's esophagus   . Bipolar affective disorder ([place])   . Chronic respiratory failure ([place])   . Constipation, chronic   . Diabetes mellitus without complication ([place])   . Dry eye   . Gastroparesis   . GERD (gastroesophageal reflux disease)   . Hyperlipidemia   . Hypertension   . Intractable chronic migraine without aura 06/04/2015  . Migraine headache   . Morbid obesity ([place])   . OSA  (obstructive sleep apnea)    uses a cpap  . Osteoarthritis    bilateral knee  . Sleep apnea    has c-pap  . Unspecified hypothyroidism   . Vitamin B 12 deficiency   . Vitamin D deficiency     PAST SURGICAL HISTORY: Past Surgical History:  Procedure Laterality Date  . APPENDECTOMY  1978  . CHOLECYSTECTOMY  2005  . SHOULDER ARTHROSCOPY  7/12   left-dsc  . TONSILLECTOMY  at age 59  . TRIGGER FINGER RELEASE  12/20/2012   Procedure: RELEASE TRIGGER FINGER/A-1 PULLEY;  Surgeon: Tennis Must, MD;  Location: [facility];  Service: Orthopedics;  Laterality: Left;  LEFT TRIGGER THUMB RELEASE    FAMILY HISTORY: Family History  Problem Relation Age of Onset  . Asthma Father   . Allergies Father   . Heart disease  Father     enlarged heart  . Peripheral vascular disease Father   . Diabetes Father   . Hyperlipidemia Father   . Arthritis Father   . Asthma Sister   . Cancer Sister     colon at 85 yr old.  . Colon cancer Sister   . Allergies Mother   . Stroke Mother 69    with hemi paralysis  . Diabetes Mother   . Hyperlipidemia Mother   . Hypertension Mother   . GI problems Mother   . Arthritis Mother   . Allergies Brother   . Early death Brother 9    congenital abormality  . Allergies Sister   . Diabetes Sister   . Asthma Sister   . Colon polyps Sister   . Hyperlipidemia Sister   . GI problems Sister     gastroporesis   . Liver disease Sister     fatty liver  . Stroke Sister     intercrandial bleed  . Diabetes Brother   . Hypertension Brother   . Hyperlipidemia Brother   . Diabetes Daughter   . Hyperlipidemia Daughter   . Hypertension Daughter   . Heart disease Paternal Aunt   . Migraines Neg Hx     SOCIAL HISTORY: Social History   Social History  . Marital status: Single    Spouse name: N/A  . Number of children: 0  . Years of education: HS   Occupational History  . disabled Unemployed   Social History Main Topics  . Smoking status:  Never Smoker  . Smokeless tobacco: Never Used  . Alcohol use No  . Drug use: No  . Sexual activity: No   Other Topics Concern  . Not on file   Social History Narrative   Patient is right handed.   Patient drinks 2 glasses of caffeine daily.      PHYSICAL EXAM  Vitals:   03/02/17 1106  BP: 122/76  Pulse: (!) 52  Weight: 269 lb 6.4 oz (122.2 kg)   Body mass index is 46.24 kg/m.  Generalized: Well developed, in no acute distress   Neurological examination  Mentation: Alert oriented to time, place, history taking. Follows all commands speech and language fluent Cranial nerve II-XII: Pupils were equal round reactive to light. Extraocular movements were full, visual field were full on confrontational test. Facial sensation and strength were normal. Uvula tongue midline. Head turning and shoulder shrug  were normal and symmetric. Motor: The motor testing reveals 5 over 5 strength of all 4 extremities. Good symmetric motor tone is noted throughout.  Sensory: Sensory testing is intact to soft touch on all 4 extremities. No evidence of extinction is noted.  Coordination: Cerebellar testing reveals good finger-nose-finger and heel-to-shin bilaterally.  Gait and station: Gait is normal. Reflexes: Deep tendon reflexes are symmetric and normal bilaterally.   DIAGNOSTIC DATA (LABS, IMAGING, TESTING) - I reviewed patient records, labs, notes, testing and imaging myself where available.  Lab Results  Component Value Date   WBC 4.5 (A) 02/25/2014   HGB 12.9 02/25/2014   HCT 40.9 02/25/2014   MCV 85.8 02/25/2014   PLT 304 09/05/2010      Component Value Date/Time   NA 142 09/29/2016 0839   K 4.1 09/29/2016 0839   CL 98 09/29/2016 0839   CO2 26 09/29/2016 0839   GLUCOSE 95 09/29/2016 0839   GLUCOSE 106 (H) 06/18/2013 1009   BUN 19 09/29/2016 0839   CREATININE 0.87 09/29/2016 0839   CREATININE  0.82 06/18/2013 1009   CALCIUM 9.6 09/29/2016 0839   PROT 7.2 09/29/2016 0839    ALBUMIN 4.3 09/29/2016 0839   AST 17 09/29/2016 0839   ALT 15 09/29/2016 0839   ALKPHOS 65 09/29/2016 0839   BILITOT 0.2 09/29/2016 0839   GFRNONAA 78 09/29/2016 0839   GFRNONAA 87 06/18/2013 1009   GFRAA 90 09/29/2016 0839   GFRAA >89 06/18/2013 1009   Lab Results  Component Value Date   CHOL 190 09/29/2016   HDL 42 09/29/2016   LDLCALC 112 (H) 09/29/2016   LDLDIRECT 122 (H) 09/29/2016   TRIG 179 (H) 09/29/2016   CHOLHDL 4.5 (H) 09/29/2016   Lab Results  Component Value Date   HGBA1C 7.0 01/04/2016   Lab Results  Component Value Date   VITAMINB12 1,140 (H) 07/05/2016   Lab Results  Component Value Date   TSH 2.330 07/05/2016      ASSESSMENT AND PLAN 51 y.o. year old female  has a past medical history of Allergic rhinitis; Barrett's esophagus; Bipolar affective disorder ([place]); Chronic respiratory failure ([place]); Constipation, chronic; Diabetes mellitus without complication ([place]); Dry eye; Gastroparesis; GERD (gastroesophageal reflux disease); Hyperlipidemia; Hypertension; Intractable chronic migraine without aura (06/04/2015); Migraine headache; Morbid obesity ([place]); OSA (obstructive sleep apnea); Osteoarthritis; Sleep apnea; Unspecified hypothyroidism; Vitamin B 12 deficiency; and Vitamin D deficiency. here with:  1. Migraine headaches  The patient will increase gabapentin to 600 mg twice a day. She will continue using Imitrex injections for acute treatment. I advised the patient that we should reconsider Botox due to her headache frequency. She is amenable to this if her insurance covers it. However she will be undergoing bariatric surgery next month and states that she may want to wait to after her surgery to start Botox. I am amenable to this plan. She will follow-up in 3 months or sooner if needed.  I sit 15 minutes with the patient 50% this time was spent discussing Botox therapy.    Ward Givens, MSN, NP-C 03/02/2017, 11:35 AM [facility] [address], [place], [place] [address] [phone]

## 2017-03-02 NOTE — Progress Notes (Signed)
I have read the note, and I agree with the clinical assessment and plan.  Morgan [name],Morgan [name]   

## 2017-03-03 NOTE — Pre-Procedure Instructions (Signed)
09-21-16 Stress Test (epic) 08-31-16 EKG (epic)

## 2017-03-03 NOTE — Patient Instructions (Signed)
Morgan [name]  03/03/2017   Your procedure is scheduled on: 03/21/17  Report to [facility] Main  Entrance take [facility]  elevators to 3rd floor to  [place] at 5:15 AM.  Call this number if you have problems the morning of surgery [phone]   Remember: ONLY 1 PERSON MAY GO WITH YOU TO SHORT STAY TO GET  READY MORNING OF [place].  Have a healthy snack before bed the night before surgery.  Be sure to follow all pre-op dietary instructions from Dr. Dois Davenport office.    Do not eat food or drink liquids :After Midnight   Take these medicines the morning of surgery with A SIP OF WATER: Flonase, Gabapentin, Levothyroxine, Linaclotide, Omeprazole, Pazeo eye drops, Restasis eye drops, Propanolol, Sertraline, Tramadol, Loratidine if needed.  DO NOT TAKE ANY DIABETIC MEDICATIONS DAY OF YOUR SURGERY                  Please bring your CPAP mask and tubing with you to the hospital.                You may not have any metal on your body including hair pins and              piercings  Do not wear jewelry, make-up, lotions, powders or perfumes, deodorant             Do not wear nail polish.  Do not shave  48 hours prior to surgery.              Men may shave face and neck.   Do not bring valuables to the hospital. [place].  Contacts, dentures or bridgework may not be worn into surgery.  Leave suitcase in the car. After surgery it may be brought to your room.              Please read over the following fact sheets you were given: _____________________________________________________________________             [facility] - Preparing for Surgery Before surgery, you can play an important role.  Because skin is not sterile, your skin needs to be as free of germs as possible.  You can reduce the number of germs on your skin by washing with CHG (chlorahexidine gluconate) soap before surgery.  CHG is an antiseptic  cleaner which kills germs and bonds with the skin to continue killing germs even after washing. Please DO NOT use if you have an allergy to CHG or antibacterial soaps.  If your skin becomes reddened/irritated stop using the CHG and inform your nurse when you arrive at Short Stay. Do not shave (including legs and underarms) for at least 48 hours prior to the first CHG shower.  You may shave your face/neck. Please follow these instructions carefully:  1.  Shower with CHG Soap the night before surgery and the  morning of Surgery.  2.  If you choose to wash your hair, wash your hair first as usual with your  normal  shampoo.  3.  After you shampoo, rinse your hair and body thoroughly to remove the  shampoo.  4.  Use CHG as you would any other liquid soap.  You can apply chg directly  to the skin and wash                       Gently with a scrungie or clean washcloth.  5.  Apply the CHG Soap to your body ONLY FROM THE NECK DOWN.   Do not use on face/ open                           Wound or open sores. Avoid contact with eyes, ears mouth and genitals (private parts).                       Wash face,  Genitals (private parts) with your normal soap.             6.  Wash thoroughly, paying special attention to the area where your surgery  will be performed.  7.  Thoroughly rinse your body with warm water from the neck down.  8.  DO NOT shower/wash with your normal soap after using and rinsing off  the CHG Soap.                9.  Pat yourself dry with a clean towel.            10.  Wear clean pajamas.            11.  Place clean sheets on your bed the night of your first shower and do not  sleep with pets. Day of Surgery : Do not apply any lotions/deodorants the morning of surgery.  Please wear clean clothes to the hospital/surgery center.  FAILURE TO FOLLOW THESE INSTRUCTIONS MAY RESULT IN THE CANCELLATION OF YOUR SURGERY PATIENT  SIGNATURE_________________________________  NURSE SIGNATURE__________________________________  ________________________________________________________________________ How to Manage Your Diabetes Before and After Surgery  Why is it important to control my blood sugar before and after surgery? . Improving blood sugar levels before and after surgery helps healing and can limit problems. . A way of improving blood sugar control is eating a healthy diet by: o  Eating less sugar and carbohydrates o  Increasing activity/exercise o  Talking with your doctor about reaching your blood sugar goals . High blood sugars (greater than 180 mg/dL) can raise your risk of infections and slow your recovery, so you will need to focus on controlling your diabetes during the weeks before surgery. . Make sure that the doctor who takes care of your diabetes knows about your planned surgery including the date and location.  How do I manage my blood sugar before surgery? . Check your blood sugar at least 4 times a day, starting 2 days before surgery, to make sure that the level is not too high or low. o Check your blood sugar the morning of your surgery when you wake up and every 2 hours until you get to the Short Stay unit. . If your blood sugar is less than 70 mg/dL, you will need to treat for low blood sugar: o Do not take insulin. o Treat a low blood sugar (less than 70 mg/dL) with  cup of clear juice (cranberry or apple), 4 glucose tablets, OR glucose gel. o Recheck blood sugar in 15 minutes after treatment (to make sure it is greater than 70 mg/dL). If your blood sugar is not greater than 70 mg/dL on recheck, call [phone]  for further instructions. . Report your blood sugar to the short stay nurse when you get to Short Stay.  . If you are admitted to the hospital after surgery: o Your blood sugar will be checked by the staff and you will probably be given insulin after surgery (instead of oral diabetes  medicines) to make sure you have good blood sugar levels. o The goal for blood sugar control after surgery is 80-180 mg/dL.   WHAT DO I DO ABOUT MY DIABETES MEDICATION?  Do not take oral diabetes medicines (pills) the morning of surgery.   Patient Signature:  Date:   Nurse Signature:  Date:   Reviewed and Endorsed by [facility] Patient Education Committee, August 2015

## 2017-03-03 NOTE — Progress Notes (Signed)
NEED ORDERS IN EPIC FOR 4-10 SURGERY PRE OP IS 3-27

## 2017-03-06 ENCOUNTER — Encounter: Payer: Medicare Other | Attending: General Surgery | Admitting: Skilled Nursing Facility1

## 2017-03-06 ENCOUNTER — Other Ambulatory Visit: Payer: Self-pay | Admitting: Family Medicine

## 2017-03-06 DIAGNOSIS — I10 Essential (primary) hypertension: Secondary | ICD-10-CM | POA: Diagnosis not present

## 2017-03-06 DIAGNOSIS — E785 Hyperlipidemia, unspecified: Secondary | ICD-10-CM | POA: Diagnosis not present

## 2017-03-06 DIAGNOSIS — E119 Type 2 diabetes mellitus without complications: Secondary | ICD-10-CM | POA: Diagnosis not present

## 2017-03-06 DIAGNOSIS — D119 Benign neoplasm of major salivary gland, unspecified: Secondary | ICD-10-CM | POA: Diagnosis not present

## 2017-03-06 DIAGNOSIS — Z713 Dietary counseling and surveillance: Secondary | ICD-10-CM | POA: Insufficient documentation

## 2017-03-06 DIAGNOSIS — K219 Gastro-esophageal reflux disease without esophagitis: Secondary | ICD-10-CM | POA: Diagnosis not present

## 2017-03-07 ENCOUNTER — Encounter (HOSPITAL_COMMUNITY): Payer: Self-pay

## 2017-03-07 ENCOUNTER — Encounter (HOSPITAL_COMMUNITY)
Admission: RE | Admit: 2017-03-07 | Discharge: 2017-03-07 | Disposition: A | Discharge status: Home / Self Care | Payer: Medicare Other | Source: Ambulatory Visit | Attending: General Surgery | Admitting: General Surgery

## 2017-03-07 DIAGNOSIS — Z01812 Encounter for preprocedural laboratory examination: Secondary | ICD-10-CM | POA: Insufficient documentation

## 2017-03-07 LAB — GLUCOSE, CAPILLARY: Glucose-Capillary: 107 mg/dL — ABNORMAL HIGH (ref 65–99)

## 2017-03-07 LAB — BASIC METABOLIC PANEL
ANION GAP: 8 (ref 5–15)
BUN: 19 mg/dL (ref 6–20)
CHLORIDE: 104 mmol/L (ref 101–111)
CO2: 29 mmol/L (ref 22–32)
Calcium: 9.3 mg/dL (ref 8.9–10.3)
Creatinine, Ser: 0.96 mg/dL (ref 0.44–1.00)
GFR calc Af Amer: >60 mL/min (ref >60)
GFR calc non Af Amer: >60 mL/min (ref >60)
Glucose, Bld: 98 mg/dL (ref 65–99)
Potassium: 3.5 mmol/L (ref 3.5–5.1)
Sodium: 141 mmol/L (ref 135–145)

## 2017-03-07 LAB — CBC
HEMATOCRIT: 37.7 % (ref 36.0–46.0)
HEMOGLOBIN: 12 g/dL (ref 12.0–15.0)
MCH: 27.2 pg (ref 26.0–34.0)
MCHC: 31.8 g/dL (ref 30.0–36.0)
MCV: 85.5 fL (ref 78.0–100.0)
Platelets: 260 10*3/uL (ref 150–400)
RBC: 4.41 MIL/uL (ref 3.87–5.11)
RDW: 13.8 % (ref 11.5–15.5)
WBC: 7 10*3/uL (ref 4.0–10.5)

## 2017-03-07 LAB — PREGNANCY, URINE: PREG TEST UR: NEGATIVE

## 2017-03-08 ENCOUNTER — Ambulatory Visit (INDEPENDENT_AMBULATORY_CARE_PROVIDER_SITE_OTHER): Payer: Medicare Other | Admitting: Family Medicine

## 2017-03-08 ENCOUNTER — Encounter: Payer: Self-pay | Admitting: Family Medicine

## 2017-03-08 VITALS — BP 119/64 | HR 57 | Temp 97.9°F | Ht 64.0 in | Wt 266.6 lb

## 2017-03-08 DIAGNOSIS — E119 Type 2 diabetes mellitus without complications: Secondary | ICD-10-CM | POA: Diagnosis not present

## 2017-03-08 DIAGNOSIS — I10 Essential (primary) hypertension: Secondary | ICD-10-CM | POA: Diagnosis not present

## 2017-03-08 LAB — HEMOGLOBIN A1C
HEMOGLOBIN A1C: 6.2 % — AB (ref 4.8–5.6)
MEAN PLASMA GLUCOSE: 131 mg/dL

## 2017-03-08 NOTE — Patient Instructions (Signed)
Great to meet you again!  You are doing well, keep up the good work and I hope you do very well with your surgery!

## 2017-03-08 NOTE — Progress Notes (Signed)
   HPI  Patient presents today here to follow-up for chronic medical conditions.  Patient is having a Roux-en-Y gastric bypass in about 2 weeks. Patient has been losing weight from aggressive diet changes and is very excited to have her procedure.  As good medication compliance, she has no other complaints today.  She is mildly concerned that her A1c is actually higher than it was previously, however she understands that she is doing well controlled A1c.  PMH: Smoking status noted ROS: Per HPI  Objective: BP 119/64   Pulse (!) 57   Temp 97.9 F (36.6 C) (Oral)   Ht 5\' 4"  (1.626 m)   Wt 266 lb 9.6 oz (120.9 kg)   LMP 02/09/2017   BMI 45.76 kg/m  Gen: NAD, alert, cooperative with exam HEENT: NCAT CV: RRR, good S1/S2, no murmur Resp: CTABL, no wheezes, non-labored Ext: No edema, warm Neuro: Alert and oriented, No gross deficits  Assessment and plan:  # Type 2 diabetes Controlled A1c 6.2 Continue metformin, patient will likely have even better improvement after Roux-en-Y  # Hypertension Well-controlled on current medications, no changes Labs up-to-date  # Morbid obesity Improving with aggressive diet changes, patient also planning to have gastric bypass as explained above. Follow-up one week after surgery   Morgan Apple, MD [facility] 03/08/2017, 1:16 PM

## 2017-03-09 ENCOUNTER — Encounter: Payer: Self-pay | Admitting: Skilled Nursing Facility1

## 2017-03-09 NOTE — Progress Notes (Signed)
  Pre-Operative Nutrition Class: 03/06/2017 Appt start time: 2257   End time:  1830.  Patient was seen on 03/06/2017 for Pre-Operative Bariatric Surgery [facility]   Surgery date: 03/21/2017 Surgery type: RYGB Start weight at [facility]: 312.5 Weight today: 266.6  TANITA  BODY COMP RESULTS     BMI (kg/m^2)    Fat Mass (lbs)    Fat Free Mass (lbs)    Total Body Water (lbs)    Samples given per MNT protocol. Patient educated on appropriate usage: Opurity Multivitamin Lot # [id number] Exp:02/28/2018  Celebrate Vitamins Calcium Citrate Lot # [id number] Exp: 03/2018  Premeir Protein  Lot #[id number] Exp: 03/nov/2018  The following the learning objectives were met by the patient during this course:  Identify Pre-Op Dietary Goals and will begin 2 weeks pre-operatively  Identify appropriate sources of fluids and proteins   State protein recommendations and appropriate sources pre and post-operatively  Identify Post-Operative Dietary Goals and will follow for 2 weeks post-operatively  Identify appropriate multivitamin and calcium sources  Describe the need for physical activity post-operatively and will follow MD recommendations  State when to call healthcare provider regarding medication questions or post-operative complications  Handouts given during class include:  Pre-Op Bariatric Surgery Diet Handout  Protein Shake Handout  Post-Op Bariatric Surgery Nutrition Handout  [facility] Program Information Flyer  Support Group Information Flyer  [facility] Bariatric Supplements Price List  Follow-Up Plan: Patient will follow-up at [facility] 2 weeks post operatively for diet advancement per MD.

## 2017-03-12 DIAGNOSIS — Z9884 Bariatric surgery status: Secondary | ICD-10-CM

## 2017-03-12 HISTORY — DX: Bariatric surgery status: Z98.84

## 2017-03-14 ENCOUNTER — Other Ambulatory Visit: Payer: Self-pay | Admitting: Family Medicine

## 2017-03-14 ENCOUNTER — Telehealth: Payer: Self-pay | Admitting: *Deleted

## 2017-03-14 ENCOUNTER — Ambulatory Visit (INDEPENDENT_AMBULATORY_CARE_PROVIDER_SITE_OTHER): Payer: Medicare Other | Admitting: *Deleted

## 2017-03-14 DIAGNOSIS — E538 Deficiency of other specified B group vitamins: Secondary | ICD-10-CM | POA: Diagnosis not present

## 2017-03-14 NOTE — Telephone Encounter (Signed)
Received fax from [facility] and [employer]. They stated pt stated propranolol and gabapentin doses lowered d/t low heart rate. D/t this she was having increase in headache and having to use acute management (imitrex. tramadol) more often. They suggested gabapentin be titrated back up to maintenance dose. Propranolol likely cause of lowered heart rate.  I called pt and relayed we received above message. She stated she saw MM,NP on 03/02/17 and she had previously misunderstood CW,MD message to titrate up to 600mg  2x/day. She just started taking the 600mg  2x/day. She is going better on increased dose. Still having headaches. I encouraged pt to continue to take medication. Can take 4-6 weeks for medications to reach max benefit. Pt verbalized understanding and agreeable to plan. She will call if she has new or worsening symptoms. Nothing further needed at this time.

## 2017-03-14 NOTE — Progress Notes (Signed)
Vit B12 inj given Tolerated well 

## 2017-03-14 NOTE — Telephone Encounter (Signed)
I have never seen this patient

## 2017-03-14 NOTE — Telephone Encounter (Signed)
Noted  

## 2017-03-15 ENCOUNTER — Other Ambulatory Visit: Payer: Self-pay | Admitting: Neurology

## 2017-03-17 ENCOUNTER — Ambulatory Visit (HOSPITAL_COMMUNITY)
Admission: RE | Admit: 2017-03-17 | Discharge: 2017-03-17 | Disposition: A | Discharge status: Home / Self Care | Payer: Medicare Other | Source: Ambulatory Visit | Attending: General Surgery | Admitting: General Surgery

## 2017-03-17 ENCOUNTER — Other Ambulatory Visit (HOSPITAL_COMMUNITY): Payer: Self-pay | Admitting: General Surgery

## 2017-03-17 ENCOUNTER — Ambulatory Visit: Payer: Self-pay | Admitting: General Surgery

## 2017-03-17 DIAGNOSIS — G43809 Other migraine, not intractable, without status migrainosus: Secondary | ICD-10-CM | POA: Diagnosis not present

## 2017-03-17 DIAGNOSIS — Z6841 Body Mass Index (BMI) 40.0 and over, adult: Secondary | ICD-10-CM | POA: Diagnosis not present

## 2017-03-17 DIAGNOSIS — M1710 Unilateral primary osteoarthritis, unspecified knee: Secondary | ICD-10-CM | POA: Diagnosis not present

## 2017-03-17 DIAGNOSIS — I10 Essential (primary) hypertension: Secondary | ICD-10-CM | POA: Diagnosis not present

## 2017-03-17 NOTE — H&P (Signed)
Morgan [name] 03/17/2017 9:03 AM Location: [facility] Patient #: [id number] DOB: [birth year] Single / Language: Morgan [name] / Race: White Female  History of Present Illness Randall Hiss M. Beza Steppe MD; 03/17/2017 9:56 AM) The patient is a 51 year old female who presents for a bariatric surgery evaluation. She comes in today for her preoperative appointment. I initially met her in August 2017. Her weight was 309 pounds. She denies any medical changes since she was last seen other than the fact that she has lost over 50 pounds. She started regular physical activity including water aerobics as well as cutting back on unhealthy foods and eliminating sweet tea and sodas. She denies any trips the emergency room hospital. She was seen by cardiology and underwent a nuclear stress test which was low risk. Cardiology officially cleared her for surgery. She also states that her reflux symptoms are much improved. She still occasionally has a sensation of food sticking in her upper abdomen due to her underlying gastroparesis. She denies any chest pain, chest pressure, shortness of breath. Her preoperative labs were unremarkable. However her hemoglobin A1c improved from 7.2 down to 6   07/14/2016 She is referred by Dr Sabra Heck to discuss weight loss surgery. She attended our seminar in person. She is debating between a sleeve gastrectomy and a laparoscopic adjustable gastric band. She is a little bit nervous about having intestinal rerouting. Her main goals with weight loss surgery are to improve her health, lose weight in order to have left knee surgery. Despite numerous attempts for sustained weight loss she has been unsuccessful. She is tried low-calorie diets, nutritionist counseling, and diet medication-all without any long-term success.  Current comorbidities include hypertension, dyslipidemia, obstructive sleep apnea on CPAP, gastroesophageal reflux disease, fatty liver, bilateral knee arthritis,  diabetes mellitus type 2  She denies any chest pain or chest pressure or shortness of breath. She does have some dyspnea on exertion. She does sleep on 2 pillows. She denies any paroxysmal nocturnal dyspnea. She denies any personal or family history of blood clots. She does have sleep apnea and uses her CPAP. Oxygen is mixed in with it. Dr. Annamaria Boots is her pulmonologist. She does have gastroesophageal reflux disease and takes medication daily. She states that she has lots of gas in her abdomen. She takes linzess. She states that she has IBS-constipation. She averages a bowel movement every other day. She has had an appendectomy and laparoscopic cholecystectomy. She is due for a colonoscopy. Her last one was about 10 years ago. She is scheduled to see [facility] later this month. She reports that she has had 2 prior upper endoscopies more than 10 years ago. She states on her first upper endoscopy she was told that she had Barrett's esophagus. On a subsequent repeat endoscopy about a year later she states that she was told she had no evidence of Barrett's disease. I do not have those records. I was able to find the operative note from 2006 from [place] from [place] that showed 2 lines of gastric mucosa proximal in the esophagus. However I do not have the biopsy results. She is a G0 P0. She denies any melena or hematochezia. She denies any dysuria or hematuria. She has left shoulder pain as well as bilateral knee pain. Her left knee pain is worse than the right. She takes pills for diabetes. She also takes propranolol for migraines. She denies any TIAs or amaurosis fugax.  She denies any tobacco or alcohol use. She is on disability.  She had some blood work done about a week and a half ago. Her total cholesterol level was 179, triglyceride level 450, HDL 32, vitamin D 44. AST 42, ALT 40, hemoglobin A1c 7.2. TSH was normal at 2.33 She had an abdominal ultrasound back  in 2015 which showed fatty liver. She also had a remote gastric emptying study in 2006 which showed some evidence of gastroparesis. She had 46% remaining after 2 hours.   Problem List/Past Medical Randall Hiss M. Redmond Pulling, MD; 03/17/2017 10:00 AM) OSTEOARTHRITIS, LOCALIZED, KNEE (Y22.33) METABOLIC SYNDROME (K12.24) BIPOLAR DISEASE, CHRONIC (F31.9) MORBID OBESITY WITH BMI OF 50.0-59.9, ADULT (E66.01)  Past Surgical History Randall Hiss M. Redmond Pulling, MD; 03/17/2017 10:00 AM) Appendectomy Gallbladder Surgery - Laparoscopic Shoulder Surgery Left. Tonsillectomy  Diagnostic Studies History Randall Hiss M. Redmond Pulling, MD; 03/17/2017 10:00 AM) Colonoscopy >10 years ago Mammogram within last year Pap Smear 1-5 years ago  Allergies Randall Hiss M. Redmond Pulling, MD; 03/17/2017 10:00 AM) Codeine and Related Statins Ketoconazole (bulk) *CHEMICALS* Pravachol *ANTIHYPERLIPIDEMICS*  Medication History Randall Hiss M. Redmond Pulling, MD; 03/17/2017 10:00 AM) Medications Reconciled OxyCODONE HCl (5MG/5ML Solution, 5-10 Milliliter Oral every four hours, as needed, Taken starting 03/17/2017) Active. Zofran ODT (4MG Tablet Disint, 1 (one) Tablet Disperse Oral every six hours, as needed, Taken starting 03/17/2017) Active. Diclofenac Sodium (1% Gel, Transdermal) Active. Ezetimibe (10MG Tablet, Oral) Active. Fenofibrate Micronized (134MG Capsule, Oral) Active. Ferrous Gluconate (324 (38 Fe)MG Tablet, Oral) Active. Fluticasone Furoate (100MCG/ACT Aero Pow Br Act, Inhalation) Active. Furosemide (20MG Tablet, Oral) Active. Gabapentin (300MG Capsule, Oral) Active. Levothyroxine Sodium (150MCG Tablet, Oral) Active. Linzess (145MCG Capsule, Oral) Active. MetFORMIN HCl (500MG Tablet, Oral) Active. Multiple Vitamin (Oral) Active. Healthy Colon (Oral) Active. Linaclotide (145MCG Capsule, Oral) Active. OneTouch Ultra Blue (In Vitro) Active. Omeprazole (40MG Capsule DR, Oral) Active. Pazeo (0.7% Solution, Ophthalmic) Active. Potassium  Chloride ER (10MEQ Tablet ER, Oral) Active. Propranolol HCl (60MG Tablet, Oral) Active. Restasis Multidose (0.05% Emulsion, Ophthalmic) Active. Sertraline HCl (100MG Tablet, Oral) Active. SUMAtriptan Succinate (6MG/0.5ML Soln Auto-inj, Subcutaneous) Active. TiZANidine HCl (2MG Tablet, Oral) Active. TraMADol HCl (50MG Tablet, Oral) Active. Valsartan-Hydrochlorothiazide (160-25MG Tablet, Oral) Active. Ziprasidone HCl (60MG Capsule, Oral) Active. Vitamin D (2000UNIT Capsule, Oral) Active. Nystatin (6MU/GM Powder,) Active.  Social History Randall Hiss M. Redmond Pulling, MD; 03/17/2017 10:00 AM) Caffeine use Tea. No alcohol use No drug use Tobacco use Never smoker.  Family History Randall Hiss M. Redmond Pulling, MD; 03/17/2017 10:00 AM) Anesthetic complications Sister. Arthritis Brother, Father, Mother, Sister. Bleeding disorder Father. Cerebrovascular Accident Mother, Sister. Colon Cancer Sister. Colon Polyps Father, Sister. Depression Father, Mother, Sister. Diabetes Mellitus Brother, Father, Mother, Sister. Heart Disease Father. Hypertension Brother, Father, Mother, Sister.  Pregnancy / Birth History Randall Hiss M. Redmond Pulling, MD; 03/17/2017 10:00 AM) Age at menarche 74 years. Gravida 0 Irregular periods  Other Problems Randall Hiss M. Redmond Pulling, MD; 03/17/2017 10:00 AM) Anxiety Disorder Arthritis Back Pain Depression Home Oxygen Use Hypercholesterolemia Other disease, cancer, significant illness HEADACHE, VARIANT MIGRAINE (G43.809) HYPOTHYROIDISM, ADULT (E03.9) DIABETES MELLITUS TYPE 2 IN OBESE (E11.69) GASTROESOPHAGEAL REFLUX DISEASE, ESOPHAGITIS PRESENCE NOT SPECIFIED (K21.9) OBSTRUCTIVE SLEEP APNEA ON CPAP (G47.33) ESSENTIAL HYPERTENSION (I10)     Review of Systems Randall Hiss M. Jamieson Hetland MD; 03/17/2017 9:57 AM) General Present- Fatigue, Night Sweats, Weight Gain and Weight Loss. Not Present- Appetite Loss, Chills and Fever. HEENT Present- Seasonal Allergies, Sinus Pain and Wears  glasses/contact lenses. Not Present- Earache, Hearing Loss, Hoarseness, Nose Bleed, Oral Ulcers, Ringing in the Ears, Sore Throat, Visual Disturbances and Yellow Eyes. Respiratory Present- Snoring. Not Present- Bloody sputum, Chronic Cough, Difficulty Breathing and Wheezing. Breast  Not Present- Breast Mass, Breast Pain, Nipple Discharge and Skin Changes. Cardiovascular Present- Difficulty Breathing Lying Down. Not Present- Chest Pain, Leg Cramps, Palpitations, Rapid Heart Rate, Shortness of Breath and Swelling of Extremities. Gastrointestinal Present- Bloating, Constipation, Excessive gas, Gets full quickly at meals and Nausea. Not Present- Abdominal Pain, Bloody Stool, Change in Bowel Habits, Chronic diarrhea, Difficulty Swallowing, Hemorrhoids, Indigestion, Rectal Pain and Vomiting. Female Genitourinary Not Present- Frequency, Nocturia, Painful Urination, Pelvic Pain and Urgency. Musculoskeletal Present- Back Pain, Joint Pain and Swelling of Extremities. Not Present- Joint Stiffness, Muscle Pain and Muscle Weakness. Neurological Present- Headaches. Not Present- Decreased Memory, Fainting, Numbness, Seizures, Tingling, Tremor, Trouble walking and Weakness. Psychiatric Present- Anxiety, Bipolar and Depression. Not Present- Change in Sleep Pattern, Fearful and Frequent crying. Endocrine Present- Hot flashes. Not Present- Cold Intolerance, Excessive Hunger, Hair Changes, Heat Intolerance and New Diabetes. Hematology Not Present- Blood Thinners, Easy Bruising, Excessive bleeding, Gland problems, HIV and Persistent Infections.  Vitals (Alisha Spillers CMA; 03/17/2017 9:04 AM) 03/17/2017 9:03 AM Weight: 258.4 lb Height: 64.25in Body Surface Area: 2.19 m Body Mass Index: 44.01 kg/m  Temp.: 17F(Oral)  Pulse: 58 (Regular)  BP: 122/82 (Sitting, Left Arm, Standard)      Physical Exam Randall Hiss M. Maryruth Apple MD; 03/17/2017 9:54 AM)  General Mental Status-Alert. General Appearance-Consistent  with stated age. Hydration-Well hydrated. Voice-Normal. Note: morbidly obese; mainly central truncal  Head and Neck Head-normocephalic, atraumatic with no lesions or palpable masses. Trachea-midline. Thyroid Gland Characteristics - normal size and consistency.  Eye Eyeball - Bilateral-Extraocular movements intact. Sclera/Conjunctiva - Bilateral-No scleral icterus.  ENMT Note: dentition adequate; ears: normal external ears.  Chest and Lung Exam Chest and lung exam reveals -quiet, even and easy respiratory effort with no use of accessory muscles and on auscultation, normal breath sounds, no adventitious sounds and normal vocal resonance. Inspection Chest Wall - Normal. Back - normal.  Breast - Did not examine.  Cardiovascular Cardiovascular examination reveals -normal heart sounds, regular rate and rhythm with no murmurs and normal pedal pulses bilaterally.  Abdomen Inspection Inspection of the abdomen reveals - No Hernias. Skin - Scar - Right Lower Quadrant(old appy scar) and no surgical scars. Palpation/Percussion Palpation and Percussion of the abdomen reveal - Soft, Non Tender, No Rebound tenderness, No Rigidity (guarding) and No hepatosplenomegaly. Auscultation Auscultation of the abdomen reveals - Bowel sounds normal.  Peripheral Vascular Upper Extremity Palpation - Pulses bilaterally normal.  Neurologic Neurologic evaluation reveals -alert and oriented x 3 with no impairment of recent or remote memory. Mental Status-Normal.  Neuropsychiatric The patient's mood and affect are described as -normal. Judgment and Insight-insight is appropriate concerning matters relevant to self.  Musculoskeletal Normal Exam - Left-Upper Extremity Strength Normal and Lower Extremity Strength Normal. Normal Exam - Right-Upper Extremity Strength Normal and Lower Extremity Strength Normal. Note: Left knee crepitus left knee crepitus  Lymphatic Head  & Neck  General Head & Neck Lymphatics: Bilateral - Description - Normal. Axillary - Did not examine. Femoral & Inguinal - Did not examine.    Assessment & Plan Randall Hiss M. Meggin Ola MD; 03/17/2017 9:59 AM)  MORBID OBESITY WITH BMI OF 50.0-59.9, ADULT (E66.01) Impression: We reviewed her preoperative workup. She is missing her upper GI she will go today for that. She had an upper endoscopy in 2013 which showed no evidence of a hiatal hernia as well as no evidence of Barrett's esophagus. I congratulated her on her weight loss. We discussed the typical surgery process as well as typical postoperative course. All of her questions  were asked and answered. She was given her postoperative prescriptions today.  Current Plans Pt Education - EMW_preopbariatric OSTEOARTHRITIS, LOCALIZED, KNEE (M17.10)  BIPOLAR DISEASE, CHRONIC (F31.9) Impression: seems stable. no recent hospitalizations per patient. psychological clearance recvd  HEADACHE, VARIANT MIGRAINE (G43.809)  HYPOTHYROIDISM, ADULT (E03.9)  OBSTRUCTIVE SLEEP APNEA ON CPAP (G47.33)  ESSENTIAL HYPERTENSION (I10)  GASTROESOPHAGEAL REFLUX DISEASE, ESOPHAGITIS PRESENCE NOT SPECIFIED (K21.9) Impression: There was a remote question of Barrett's esophagus however the patient had an upper endoscopy in 2013 which showed no evidence of Barrett's esophagus and no hiatal hernia. However given her gastroparesis I do think of Roux-en-Y gastric bypass is the better surgical option as opposed to a sleeve gastrectomy.  Leighton Ruff. Redmond Pulling, MD, FACS General, Bariatric, & Minimally Invasive Surgery [facility], [place]

## 2017-03-20 NOTE — Anesthesia Preprocedure Evaluation (Addendum)
Anesthesia Evaluation  Patient identified by MRN, date of birth, ID band Patient awake    Reviewed: Allergy & Precautions, H&P , Patient's Chart, lab work & pertinent test results, reviewed documented beta blocker date and time   Airway Mallampati: II  TM Distance: >3 FB Neck ROM: full    Dental no notable dental hx.    Pulmonary    Pulmonary exam normal breath sounds clear to auscultation       Cardiovascular hypertension,  Rhythm:regular Rate:Normal     Neuro/Psych    GI/Hepatic   Endo/Other  diabetesMorbid obesity  Renal/GU      Musculoskeletal   Abdominal   Peds  Hematology   Anesthesia Other Findings  Gastroparesis     Allergic rhinitis   Chronic respiratory failure (HCC)   Hypertension   GERD (gastroesophageal reflux disease)   Vitamin D deficiency   Bipolar affective disorder ([place])   Morbid obesity  OSA uses a cpap with 2 units o2  Diabetes mellitus without complication type 2  Migraine headache         Reproductive/Obstetrics                            Anesthesia Physical Anesthesia Plan  ASA: III  Anesthesia Plan: General   Post-op Pain Management:    Induction: Intravenous  Airway Management Planned: Oral ETT  Additional Equipment:   Intra-op Plan:   Post-operative Plan: Extubation in OR  Informed Consent: I have reviewed the patients History and Physical, chart, labs and discussed the procedure including the risks, benefits and alternatives for the proposed anesthesia with the patient or authorized representative who has indicated his/her understanding and acceptance.   Dental Advisory Given  Plan Discussed with: CRNA and Surgeon  Anesthesia Plan Comments: (  )       Anesthesia Quick Evaluation

## 2017-03-21 ENCOUNTER — Encounter (HOSPITAL_COMMUNITY): Payer: Self-pay

## 2017-03-21 ENCOUNTER — Encounter (HOSPITAL_COMMUNITY)
Admission: RE | Disposition: A | Discharge status: Home / Self Care | Payer: Self-pay | Source: Ambulatory Visit | Attending: General Surgery

## 2017-03-21 ENCOUNTER — Inpatient Hospital Stay (HOSPITAL_COMMUNITY)
Admission: RE | Admit: 2017-03-21 | Discharge: 2017-03-23 | DRG: 620 | Disposition: A | Discharge status: Home / Self Care | Payer: Medicare Other | Source: Ambulatory Visit | Attending: General Surgery | Admitting: General Surgery

## 2017-03-21 ENCOUNTER — Inpatient Hospital Stay (HOSPITAL_COMMUNITY): Payer: Medicare Other | Admitting: Anesthesiology

## 2017-03-21 DIAGNOSIS — K219 Gastro-esophageal reflux disease without esophagitis: Secondary | ICD-10-CM | POA: Diagnosis not present

## 2017-03-21 DIAGNOSIS — Z888 Allergy status to other drugs, medicaments and biological substances status: Secondary | ICD-10-CM

## 2017-03-21 DIAGNOSIS — E119 Type 2 diabetes mellitus without complications: Secondary | ICD-10-CM

## 2017-03-21 DIAGNOSIS — G8929 Other chronic pain: Secondary | ICD-10-CM | POA: Diagnosis not present

## 2017-03-21 DIAGNOSIS — F319 Bipolar disorder, unspecified: Secondary | ICD-10-CM | POA: Diagnosis present

## 2017-03-21 DIAGNOSIS — M25562 Pain in left knee: Secondary | ICD-10-CM | POA: Diagnosis not present

## 2017-03-21 DIAGNOSIS — Z885 Allergy status to narcotic agent status: Secondary | ICD-10-CM

## 2017-03-21 DIAGNOSIS — G4733 Obstructive sleep apnea (adult) (pediatric): Secondary | ICD-10-CM | POA: Diagnosis not present

## 2017-03-21 DIAGNOSIS — E8881 Metabolic syndrome: Secondary | ICD-10-CM | POA: Diagnosis present

## 2017-03-21 DIAGNOSIS — M25561 Pain in right knee: Secondary | ICD-10-CM | POA: Diagnosis not present

## 2017-03-21 DIAGNOSIS — Z6841 Body Mass Index (BMI) 40.0 and over, adult: Secondary | ICD-10-CM

## 2017-03-21 DIAGNOSIS — Z7951 Long term (current) use of inhaled steroids: Secondary | ICD-10-CM | POA: Diagnosis not present

## 2017-03-21 DIAGNOSIS — Z7984 Long term (current) use of oral hypoglycemic drugs: Secondary | ICD-10-CM | POA: Diagnosis not present

## 2017-03-21 DIAGNOSIS — Z9884 Bariatric surgery status: Secondary | ICD-10-CM

## 2017-03-21 DIAGNOSIS — I10 Essential (primary) hypertension: Secondary | ICD-10-CM | POA: Diagnosis present

## 2017-03-21 DIAGNOSIS — I152 Hypertension secondary to endocrine disorders: Secondary | ICD-10-CM | POA: Diagnosis present

## 2017-03-21 DIAGNOSIS — E1159 Type 2 diabetes mellitus with other circulatory complications: Secondary | ICD-10-CM | POA: Diagnosis present

## 2017-03-21 DIAGNOSIS — J9610 Chronic respiratory failure, unspecified whether with hypoxia or hypercapnia: Secondary | ICD-10-CM | POA: Diagnosis not present

## 2017-03-21 DIAGNOSIS — Z79891 Long term (current) use of opiate analgesic: Secondary | ICD-10-CM

## 2017-03-21 DIAGNOSIS — E785 Hyperlipidemia, unspecified: Secondary | ICD-10-CM | POA: Diagnosis not present

## 2017-03-21 DIAGNOSIS — K760 Fatty (change of) liver, not elsewhere classified: Secondary | ICD-10-CM | POA: Diagnosis present

## 2017-03-21 DIAGNOSIS — K449 Diaphragmatic hernia without obstruction or gangrene: Secondary | ICD-10-CM | POA: Diagnosis present

## 2017-03-21 DIAGNOSIS — Z9981 Dependence on supplemental oxygen: Secondary | ICD-10-CM

## 2017-03-21 DIAGNOSIS — M170 Bilateral primary osteoarthritis of knee: Secondary | ICD-10-CM | POA: Diagnosis not present

## 2017-03-21 DIAGNOSIS — Z9049 Acquired absence of other specified parts of digestive tract: Secondary | ICD-10-CM

## 2017-03-21 DIAGNOSIS — Z79899 Other long term (current) drug therapy: Secondary | ICD-10-CM | POA: Diagnosis not present

## 2017-03-21 HISTORY — PX: GASTRIC ROUX-EN-Y: SHX5262

## 2017-03-21 LAB — GLUCOSE, CAPILLARY
GLUCOSE-CAPILLARY: 184 mg/dL — AB (ref 65–99)
GLUCOSE-CAPILLARY: 213 mg/dL — AB (ref 65–99)
GLUCOSE-CAPILLARY: 150 mg/dL — AB (ref 65–99)
Glucose-Capillary: 111 mg/dL — ABNORMAL HIGH (ref 65–99)
Glucose-Capillary: 161 mg/dL — ABNORMAL HIGH (ref 65–99)

## 2017-03-21 LAB — HEMOGLOBIN AND HEMATOCRIT, BLOOD
HCT: 37.5 % (ref 36.0–46.0)
Hemoglobin: 12.2 g/dL (ref 12.0–15.0)

## 2017-03-21 LAB — PREGNANCY, URINE: Preg Test, Ur: NEGATIVE

## 2017-03-21 LAB — HEPATIC FUNCTION PANEL
ALBUMIN: 4.3 g/dL (ref 3.5–5.0)
ALK PHOS: 55 U/L (ref 38–126)
ALT: 22 U/L (ref 14–54)
AST: 27 U/L (ref 15–41)
BILIRUBIN TOTAL: 0.5 mg/dL (ref 0.3–1.2)
Total Protein: 7.6 g/dL (ref 6.5–8.1)

## 2017-03-21 SURGERY — LAPAROSCOPIC ROUX-EN-Y GASTRIC
Anesthesia: General

## 2017-03-21 MED ORDER — CHLORHEXIDINE GLUCONATE 4 % EX LIQD: 60.0000 mL | Freq: Once | CUTANEOUS | Status: DC

## 2017-03-21 MED ORDER — DEXAMETHASONE SODIUM PHOSPHATE 10 MG/ML IJ SOLN
INTRAMUSCULAR | Status: AC
  Filled 2017-03-21: qty 1

## 2017-03-21 MED ORDER — MIDAZOLAM HCL 5 MG/5ML IJ SOLN
INTRAMUSCULAR | Status: DC | PRN
  Administered 2017-03-21: 2 mg via INTRAVENOUS

## 2017-03-21 MED ORDER — KETAMINE HCL 10 MG/ML IJ SOLN
INTRAMUSCULAR | Status: AC
  Filled 2017-03-21: qty 1

## 2017-03-21 MED ORDER — LIDOCAINE 2% (20 MG/ML) 5 ML SYRINGE
INTRAMUSCULAR | Status: AC
  Filled 2017-03-21: qty 15

## 2017-03-21 MED ORDER — ONDANSETRON HCL 4 MG/2ML IJ SOLN
INTRAMUSCULAR | Status: AC
  Filled 2017-03-21: qty 2

## 2017-03-21 MED ORDER — ONDANSETRON HCL 4 MG/2ML IJ SOLN
INTRAMUSCULAR | Status: DC | PRN
  Administered 2017-03-21: 4 mg via INTRAVENOUS

## 2017-03-21 MED ORDER — SODIUM CHLORIDE 0.9 % IJ SOLN
INTRAMUSCULAR | Status: DC | PRN
  Administered 2017-03-21: 50 mL

## 2017-03-21 MED ORDER — PROPOFOL 10 MG/ML IV BOLUS
INTRAVENOUS | Status: AC
  Filled 2017-03-21: qty 40

## 2017-03-21 MED ORDER — OXYCODONE HCL 5 MG/5ML PO SOLN
5.0000 mg | ORAL | Status: DC | PRN
  Administered 2017-03-21 – 2017-03-22 (×2): 10 mg via ORAL
  Administered 2017-03-22: 5 mg via ORAL
  Administered 2017-03-22 – 2017-03-23 (×4): 10 mg via ORAL
  Filled 2017-03-21 (×2): qty 10
  Filled 2017-03-21: qty 5
  Filled 2017-03-21 (×4): qty 10

## 2017-03-21 MED ORDER — ROCURONIUM BROMIDE 50 MG/5ML IV SOSY
PREFILLED_SYRINGE | INTRAVENOUS | Status: AC
  Filled 2017-03-21: qty 10

## 2017-03-21 MED ORDER — MIDAZOLAM HCL 2 MG/2ML IJ SOLN
INTRAMUSCULAR | Status: AC
  Filled 2017-03-21: qty 2

## 2017-03-21 MED ORDER — DIPHENHYDRAMINE HCL 50 MG/ML IJ SOLN: 12.5000 mg | Freq: Three times a day (TID) | INTRAMUSCULAR | Status: DC | PRN

## 2017-03-21 MED ORDER — CEFOTETAN DISODIUM-DEXTROSE 2-2.08 GM-% IV SOLR
INTRAVENOUS | Status: AC
  Filled 2017-03-21: qty 50

## 2017-03-21 MED ORDER — SODIUM CHLORIDE 0.9 % IJ SOLN
INTRAMUSCULAR | Status: AC
  Filled 2017-03-21: qty 10

## 2017-03-21 MED ORDER — KETAMINE HCL 10 MG/ML IJ SOLN
INTRAMUSCULAR | Status: DC | PRN
  Administered 2017-03-21: 30 mg via INTRAVENOUS

## 2017-03-21 MED ORDER — DEXAMETHASONE SODIUM PHOSPHATE 10 MG/ML IJ SOLN
INTRAMUSCULAR | Status: DC | PRN
  Administered 2017-03-21: 8 mg via INTRAVENOUS

## 2017-03-21 MED ORDER — EPHEDRINE 5 MG/ML INJ
INTRAVENOUS | Status: AC
  Filled 2017-03-21: qty 10

## 2017-03-21 MED ORDER — ACETAMINOPHEN 160 MG/5ML PO SOLN
325.0000 mg | ORAL | Status: DC | PRN
  Administered 2017-03-21: 650 mg via ORAL
  Filled 2017-03-21: qty 20.3

## 2017-03-21 MED ORDER — FENTANYL CITRATE (PF) 100 MCG/2ML IJ SOLN
INTRAMUSCULAR | Status: AC
  Filled 2017-03-21: qty 2

## 2017-03-21 MED ORDER — FENTANYL CITRATE (PF) 100 MCG/2ML IJ SOLN
25.0000 ug | INTRAMUSCULAR | Status: DC | PRN
  Administered 2017-03-21 (×2): 50 ug via INTRAVENOUS

## 2017-03-21 MED ORDER — FENTANYL CITRATE (PF) 100 MCG/2ML IJ SOLN
INTRAMUSCULAR | Status: DC | PRN
  Administered 2017-03-21: 100 ug via INTRAVENOUS
  Administered 2017-03-21 (×2): 50 ug via INTRAVENOUS

## 2017-03-21 MED ORDER — DEXAMETHASONE SODIUM PHOSPHATE 10 MG/ML IJ SOLN: 4.0000 mg | INTRAMUSCULAR | Status: DC

## 2017-03-21 MED ORDER — EPHEDRINE SULFATE-NACL 50-0.9 MG/10ML-% IV SOSY
PREFILLED_SYRINGE | INTRAVENOUS | Status: DC | PRN
  Administered 2017-03-21: 10 mg via INTRAVENOUS
  Administered 2017-03-21: 20 mg via INTRAVENOUS

## 2017-03-21 MED ORDER — INSULIN ASPART 100 UNIT/ML ~~LOC~~ SOLN
0.0000 [IU] | SUBCUTANEOUS | Status: DC
  Administered 2017-03-21: 2 [IU] via SUBCUTANEOUS
  Administered 2017-03-21: 3 [IU] via SUBCUTANEOUS
  Administered 2017-03-21: 5 [IU] via SUBCUTANEOUS
  Administered 2017-03-22 – 2017-03-23 (×3): 2 [IU] via SUBCUTANEOUS

## 2017-03-21 MED ORDER — HEPARIN SODIUM (PORCINE) 5000 UNIT/ML IJ SOLN
5000.0000 [IU] | INTRAMUSCULAR | Status: AC
  Administered 2017-03-21: 5000 [IU] via SUBCUTANEOUS
  Filled 2017-03-21: qty 1

## 2017-03-21 MED ORDER — GLYCOPYRROLATE 0.2 MG/ML IV SOSY
PREFILLED_SYRINGE | INTRAVENOUS | Status: DC | PRN
  Administered 2017-03-21: .2 mg via INTRAVENOUS

## 2017-03-21 MED ORDER — SCOPOLAMINE 1 MG/3DAYS TD PT72
1.0000 | MEDICATED_PATCH | TRANSDERMAL | Status: DC
  Administered 2017-03-21: 1.5 mg via TRANSDERMAL
  Filled 2017-03-21: qty 1

## 2017-03-21 MED ORDER — PROMETHAZINE HCL 25 MG/ML IJ SOLN
INTRAMUSCULAR | Status: AC
  Filled 2017-03-21: qty 1

## 2017-03-21 MED ORDER — SUGAMMADEX SODIUM 500 MG/5ML IV SOLN
INTRAVENOUS | Status: AC
  Filled 2017-03-21: qty 5

## 2017-03-21 MED ORDER — PROPOFOL 10 MG/ML IV BOLUS
INTRAVENOUS | Status: DC | PRN
  Administered 2017-03-21: 150 mg via INTRAVENOUS

## 2017-03-21 MED ORDER — GLYCOPYRROLATE 0.2 MG/ML IV SOSY
PREFILLED_SYRINGE | INTRAVENOUS | Status: AC
  Filled 2017-03-21: qty 5

## 2017-03-21 MED ORDER — PROMETHAZINE HCL 25 MG/ML IJ SOLN
6.2500 mg | INTRAMUSCULAR | Status: DC | PRN
  Administered 2017-03-21: 6.25 mg via INTRAVENOUS

## 2017-03-21 MED ORDER — SODIUM CHLORIDE 0.9 % IJ SOLN
INTRAMUSCULAR | Status: AC
  Filled 2017-03-21: qty 50

## 2017-03-21 MED ORDER — EVICEL 5 ML EX KIT
PACK | Freq: Once | CUTANEOUS | Status: AC
  Administered 2017-03-21: 1
  Filled 2017-03-21: qty 1

## 2017-03-21 MED ORDER — 0.9 % SODIUM CHLORIDE (POUR BTL) OPTIME
TOPICAL | Status: DC | PRN
Start: 2017-03-21 — End: 2017-03-21
  Administered 2017-03-21: 1000 mL

## 2017-03-21 MED ORDER — LIDOCAINE 2% (20 MG/ML) 5 ML SYRINGE
INTRAMUSCULAR | Status: DC | PRN
  Administered 2017-03-21: 1.5 mg/kg/h via INTRAVENOUS

## 2017-03-21 MED ORDER — ACETAMINOPHEN 325 MG PO TABS
650.0000 mg | ORAL_TABLET | ORAL | Status: DC | PRN
  Administered 2017-03-23: 650 mg via ORAL
  Filled 2017-03-21: qty 2

## 2017-03-21 MED ORDER — CEFOTETAN DISODIUM-DEXTROSE 2-2.08 GM-% IV SOLR
2.0000 g | INTRAVENOUS | Status: AC
  Administered 2017-03-21: 2 g via INTRAVENOUS

## 2017-03-21 MED ORDER — ENALAPRILAT 1.25 MG/ML IV SOLN
1.2500 mg | Freq: Four times a day (QID) | INTRAVENOUS | Status: DC | PRN
  Filled 2017-03-21: qty 1

## 2017-03-21 MED ORDER — ENOXAPARIN SODIUM 30 MG/0.3ML ~~LOC~~ SOLN
30.0000 mg | Freq: Two times a day (BID) | SUBCUTANEOUS | Status: DC
  Administered 2017-03-22 – 2017-03-23 (×2): 30 mg via SUBCUTANEOUS
  Filled 2017-03-21 (×2): qty 0.3

## 2017-03-21 MED ORDER — APREPITANT 40 MG PO CAPS
40.0000 mg | ORAL_CAPSULE | ORAL | Status: AC
  Administered 2017-03-21: 40 mg via ORAL
  Filled 2017-03-21: qty 1

## 2017-03-21 MED ORDER — PANTOPRAZOLE SODIUM 40 MG IV SOLR
40.0000 mg | Freq: Every day | INTRAVENOUS | Status: DC
  Administered 2017-03-21 – 2017-03-22 (×2): 40 mg via INTRAVENOUS
  Filled 2017-03-21 (×2): qty 40

## 2017-03-21 MED ORDER — KCL IN DEXTROSE-NACL 20-5-0.45 MEQ/L-%-% IV SOLN
INTRAVENOUS | Status: DC
  Administered 2017-03-21 – 2017-03-23 (×4): via INTRAVENOUS
  Administered 2017-03-23: 125 mL/h via INTRAVENOUS
  Filled 2017-03-21 (×8): qty 1000

## 2017-03-21 MED ORDER — GABAPENTIN 300 MG PO CAPS: 300.0000 mg | ORAL_CAPSULE | ORAL | Status: DC

## 2017-03-21 MED ORDER — LIDOCAINE 2% (20 MG/ML) 5 ML SYRINGE
INTRAMUSCULAR | Status: AC
  Filled 2017-03-21: qty 5

## 2017-03-21 MED ORDER — LIDOCAINE 2% (20 MG/ML) 5 ML SYRINGE
INTRAMUSCULAR | Status: DC | PRN
  Administered 2017-03-21: 100 mg via INTRAVENOUS

## 2017-03-21 MED ORDER — LACTATED RINGERS IV SOLN
INTRAVENOUS | Status: DC | PRN
  Administered 2017-03-21 (×2): via INTRAVENOUS

## 2017-03-21 MED ORDER — MORPHINE SULFATE (PF) 2 MG/ML IV SOLN
1.0000 mg | INTRAVENOUS | Status: DC | PRN
  Administered 2017-03-21 – 2017-03-22 (×5): 3 mg via INTRAVENOUS
  Administered 2017-03-22 – 2017-03-23 (×4): 2 mg via INTRAVENOUS
  Filled 2017-03-21 (×3): qty 1
  Filled 2017-03-21: qty 2
  Filled 2017-03-21: qty 1
  Filled 2017-03-21 (×4): qty 2

## 2017-03-21 MED ORDER — ROCURONIUM BROMIDE 10 MG/ML (PF) SYRINGE
PREFILLED_SYRINGE | INTRAVENOUS | Status: DC | PRN
  Administered 2017-03-21: 10 mg via INTRAVENOUS
  Administered 2017-03-21 (×2): 20 mg via INTRAVENOUS
  Administered 2017-03-21: 50 mg via INTRAVENOUS

## 2017-03-21 MED ORDER — SUGAMMADEX SODIUM 200 MG/2ML IV SOLN
INTRAVENOUS | Status: DC | PRN
  Administered 2017-03-21: 400 mg via INTRAVENOUS

## 2017-03-21 MED ORDER — LACTATED RINGERS IR SOLN
Status: DC | PRN
  Administered 2017-03-21: 1000 mL

## 2017-03-21 MED ORDER — ONDANSETRON HCL 4 MG/2ML IJ SOLN
4.0000 mg | Freq: Four times a day (QID) | INTRAMUSCULAR | Status: DC | PRN
  Administered 2017-03-21 – 2017-03-22 (×2): 4 mg via INTRAVENOUS
  Filled 2017-03-21 (×2): qty 2

## 2017-03-21 MED ORDER — PREMIER PROTEIN SHAKE
2.0000 [oz_av] | ORAL | Status: DC
  Administered 2017-03-23 (×3): 2 [oz_av] via ORAL

## 2017-03-21 MED ORDER — FENTANYL CITRATE (PF) 250 MCG/5ML IJ SOLN
INTRAMUSCULAR | Status: AC
  Filled 2017-03-21: qty 5

## 2017-03-21 MED ORDER — ACETAMINOPHEN 500 MG PO TABS
1000.0000 mg | ORAL_TABLET | ORAL | Status: AC
  Administered 2017-03-21: 1000 mg via ORAL
  Filled 2017-03-21: qty 2

## 2017-03-21 MED ORDER — LIDOCAINE 2% (20 MG/ML) 5 ML SYRINGE
INTRAMUSCULAR | Status: AC
  Filled 2017-03-21: qty 10

## 2017-03-21 MED ORDER — BUPIVACAINE LIPOSOME 1.3 % IJ SUSP
20.0000 mL | Freq: Once | INTRAMUSCULAR | Status: AC
  Administered 2017-03-21: 20 mL
  Filled 2017-03-21: qty 20

## 2017-03-21 MED ORDER — STERILE WATER FOR IRRIGATION IR SOLN
Status: DC | PRN
  Administered 2017-03-21: 2000 mL

## 2017-03-21 SURGICAL SUPPLY — 82 items
ADH SKN CLS APL DERMABOND .7 (GAUZE/BANDAGES/DRESSINGS)
APL SKNCLS STERI-STRIP NONHPOA (GAUZE/BANDAGES/DRESSINGS) ×1
APPLICATOR COTTON TIP 6IN STRL (MISCELLANEOUS) ×6 IMPLANT
APPLIER CLIP ROT 13.4 12 LRG (CLIP)
APR CLP LRG 13.4X12 ROT 20 MLT (CLIP)
BENZOIN TINCTURE PRP APPL 2/3 (GAUZE/BANDAGES/DRESSINGS) ×2 IMPLANT
BLADE SURG SZ11 CARB STEEL (BLADE) ×3 IMPLANT
CABLE HIGH FREQUENCY MONO STRZ (ELECTRODE) ×2 IMPLANT
CHLORAPREP W/TINT 26ML (MISCELLANEOUS) ×6 IMPLANT
CLIP APPLIE ROT 13.4 12 LRG (CLIP) IMPLANT
CLIP SUT LAPRA TY ABSORB (SUTURE) ×8 IMPLANT
CLOSURE WOUND 1/2 X4 (GAUZE/BANDAGES/DRESSINGS) ×1
CUTTER FLEX LINEAR 45M (STAPLE) IMPLANT
DERMABOND ADVANCED (GAUZE/BANDAGES/DRESSINGS)
DERMABOND ADVANCED .7 DNX12 (GAUZE/BANDAGES/DRESSINGS) IMPLANT
DEVICE SUT QUICK LOAD TK 5 (STAPLE) ×3 IMPLANT
DEVICE SUT TI-KNOT TK 5X26 (MISCELLANEOUS) IMPLANT
DEVICE SUTURE ENDOST 10MM (ENDOMECHANICALS) ×3 IMPLANT
DEVICE TI KNOT TK5 (MISCELLANEOUS)
DRAIN PENROSE 18X1/2 LTX STRL (DRAIN) ×2 IMPLANT
DRAIN PENROSE 18X1/4 LTX STRL (WOUND CARE) ×3 IMPLANT
ELECT REM PT RETURN 15FT ADLT (MISCELLANEOUS) ×3 IMPLANT
GAUZE SPONGE 4X4 12PLY STRL (GAUZE/BANDAGES/DRESSINGS) IMPLANT
GAUZE SPONGE 4X4 16PLY XRAY LF (GAUZE/BANDAGES/DRESSINGS) ×3 IMPLANT
GLOVE BIO SURGEON STRL SZ7.5 (GLOVE) IMPLANT
GLOVE INDICATOR 8.0 STRL GRN (GLOVE) ×3 IMPLANT
GOWN STRL REUS W/TWL XL LVL3 (GOWN DISPOSABLE) ×12 IMPLANT
HOVERMATT SINGLE USE (MISCELLANEOUS) ×3 IMPLANT
KIT BASIN OR (CUSTOM PROCEDURE TRAY) ×3 IMPLANT
KIT GASTRIC LAVAGE 34FR ADT (SET/KITS/TRAYS/PACK) ×3 IMPLANT
LUBRICANT JELLY K Y 4OZ (MISCELLANEOUS) ×3 IMPLANT
NDL SPNL 22GX3.5 QUINCKE BK (NEEDLE) ×1 IMPLANT
NEEDLE SPNL 22GX3.5 QUINCKE BK (NEEDLE) ×3 IMPLANT
PACK CARDIOVASCULAR III (CUSTOM PROCEDURE TRAY) ×3 IMPLANT
QUICK LOAD TK 5 (STAPLE) ×3
RELOAD 45 VASCULAR/THIN (ENDOMECHANICALS) IMPLANT
RELOAD ENDO STITCH 2.0 (ENDOMECHANICALS) ×24
RELOAD STAPLE 45 2.5 WHT GRN (ENDOMECHANICALS) IMPLANT
RELOAD STAPLE 45 3.5 BLU ETS (ENDOMECHANICALS) IMPLANT
RELOAD STAPLE 60 2.6 WHT THN (STAPLE) ×2 IMPLANT
RELOAD STAPLE 60 3.6 BLU REG (STAPLE) ×2 IMPLANT
RELOAD STAPLE 60 3.8 GOLD REG (STAPLE) ×1 IMPLANT
RELOAD STAPLE TA45 3.5 REG BLU (ENDOMECHANICALS) IMPLANT
RELOAD STAPLER BLUE 60MM (STAPLE) ×4 IMPLANT
RELOAD STAPLER GOLD 60MM (STAPLE) ×1 IMPLANT
RELOAD STAPLER WHITE 60MM (STAPLE) ×2 IMPLANT
RELOAD SUT SNGL STCH ABSRB 2-0 (ENDOMECHANICALS) ×4 IMPLANT
RELOAD SUT SNGL STCH BLK 2-0 (ENDOMECHANICALS) ×4 IMPLANT
SCISSORS LAP 5X45 EPIX DISP (ENDOMECHANICALS) ×3 IMPLANT
SET IRRIG TUBING LAPAROSCOPIC (IRRIGATION / IRRIGATOR) ×2 IMPLANT
SHEARS HARMONIC ACE PLUS 45CM (MISCELLANEOUS) ×3 IMPLANT
SLEEVE ADV FIXATION 12X100MM (TROCAR) ×6 IMPLANT
SLEEVE XCEL OPT CAN 5 100 (ENDOMECHANICALS) IMPLANT
SOLUTION ANTI FOG 6CC (MISCELLANEOUS) ×3 IMPLANT
STAPLER ECHELON BIOABSB 60 FLE (MISCELLANEOUS) IMPLANT
STAPLER ECHELON LONG 60 440 (INSTRUMENTS) ×3 IMPLANT
STAPLER RELOAD BLUE 60MM (STAPLE) ×12
STAPLER RELOAD GOLD 60MM (STAPLE) ×3
STAPLER RELOAD WHITE 60MM (STAPLE) ×6
STRIP CLOSURE SKIN 1/2X4 (GAUZE/BANDAGES/DRESSINGS) ×1 IMPLANT
SUT MNCRL AB 4-0 PS2 18 (SUTURE) ×3 IMPLANT
SUT RELOAD ENDO STITCH 2 48X1 (ENDOMECHANICALS) ×4
SUT RELOAD ENDO STITCH 2.0 (ENDOMECHANICALS) ×4
SUT SURGIDAC NAB ES-9 0 48 120 (SUTURE) ×6 IMPLANT
SUT VIC AB 2-0 SH 27 (SUTURE) ×3
SUT VIC AB 2-0 SH 27X BRD (SUTURE) ×1 IMPLANT
SUTURE RELOAD END STTCH 2 48X1 (ENDOMECHANICALS) ×4 IMPLANT
SUTURE RELOAD ENDO STITCH 2.0 (ENDOMECHANICALS) ×4 IMPLANT
SYR 10ML ECCENTRIC (SYRINGE) ×3 IMPLANT
SYR 20CC LL (SYRINGE) ×6 IMPLANT
TIP RIGID 35CM EVICEL (HEMOSTASIS) ×3 IMPLANT
TOWEL OR 17X26 10 PK STRL BLUE (TOWEL DISPOSABLE) ×3 IMPLANT
TOWEL OR NON WOVEN STRL DISP B (DISPOSABLE) ×3 IMPLANT
TRAY FOLEY CATH SILVER 14FR (SET/KITS/TRAYS/PACK) ×2 IMPLANT
TROCAR ADV FIXATION 12X100MM (TROCAR) ×3 IMPLANT
TROCAR ADV FIXATION 5X100MM (TROCAR) ×3 IMPLANT
TROCAR BLADELESS OPT 5 100 (ENDOMECHANICALS) ×3 IMPLANT
TROCAR XCEL 12X100 BLDLESS (ENDOMECHANICALS) ×3 IMPLANT
TUBING CONNECTING 10 (TUBING) IMPLANT
TUBING CONNECTING 10' (TUBING)
TUBING ENDO SMARTCAP PENTAX (MISCELLANEOUS) ×3 IMPLANT
TUBING INSUF HEATED (TUBING) ×3 IMPLANT

## 2017-03-21 NOTE — Transfer of Care (Signed)
Immediate Anesthesia Transfer of Care Note  Patient: Morgan [name]  Procedure(s) Performed: Procedure(s): LAPAROSCOPIC ROUX-EN-Y GASTRIC, UPPER ENDO (N/A)  Patient Location: PACU  Anesthesia Type:General  Level of Consciousness:  sedated, patient cooperative and responds to stimulation  Airway & Oxygen Therapy:Patient Spontanous Breathing and Patient connected to face mask oxgen  Post-op Assessment:  Report given to PACU RN and Post -op Vital signs reviewed and stable  Post vital signs:  Reviewed and stable  Last Vitals:  Vitals:   03/21/17 0526  BP: 121/86  Pulse: 61  Resp: 16  Temp: 52.5 C    Complications: No apparent anesthesia complications

## 2017-03-21 NOTE — Anesthesia Procedure Notes (Signed)
Procedure Name: Intubation Date/Time: 03/21/2017 7:28 AM Performed by: Talbot Grumbling Pre-anesthesia Checklist: Patient identified, Emergency Drugs available, Suction available and Patient being monitored Patient Re-evaluated:Patient Re-evaluated prior to inductionOxygen Delivery Method: Circle system utilized Preoxygenation: Pre-oxygenation with 100% oxygen Intubation Type: IV induction Ventilation: Mask ventilation without difficulty Laryngoscope Size: Mac and 3 Grade View: Grade II Tube type: Oral Tube size: 7.5 mm Number of attempts: 1 Airway Equipment and Method: Stylet Placement Confirmation: ETT inserted through vocal cords under direct vision,  positive ETCO2 and breath sounds checked- equal and bilateral Secured at: 21 cm Tube secured with: Tape Dental Injury: Teeth and Oropharynx as per pre-operative assessment

## 2017-03-21 NOTE — H&P (View-Only) (Signed)
Morgan [name] 03/17/2017 9:03 AM Location: [facility] Patient #: [id number] DOB: 03/07/1966 Single / Language: English / Race: White Female  History of Present Illness (Kaelynne Christley M. Welden Hausmann MD; 03/17/2017 9:56 AM) The patient is a 50 year old female who presents for a bariatric surgery evaluation. She comes in today for her preoperative appointment. I initially met her in August 2017. Her weight was 309 pounds. She denies any medical changes since she was last seen other than the fact that she has lost over 50 pounds. She started regular physical activity including water aerobics as well as cutting back on unhealthy foods and eliminating sweet tea and sodas. She denies any trips the emergency room hospital. She was seen by cardiology and underwent a nuclear stress test which was low risk. Cardiology officially cleared her for surgery. She also states that her reflux symptoms are much improved. She still occasionally has a sensation of food sticking in her upper abdomen due to her underlying gastroparesis. She denies any chest pain, chest pressure, shortness of breath. Her preoperative labs were unremarkable. However her hemoglobin A1c improved from 7.2 down to 6   07/14/2016 She is referred by Dr Miller to discuss weight loss surgery. She attended our seminar in person. She is debating between a sleeve gastrectomy and a laparoscopic adjustable gastric band. She is a little bit nervous about having intestinal rerouting. Her main goals with weight loss surgery are to improve her health, lose weight in order to have left knee surgery. Despite numerous attempts for sustained weight loss she has been unsuccessful. She is tried low-calorie diets, nutritionist counseling, and diet medication-all without any long-term success.  Current comorbidities include hypertension, dyslipidemia, obstructive sleep apnea on CPAP, gastroesophageal reflux disease, fatty liver, bilateral knee arthritis,  diabetes mellitus type 2  She denies any chest pain or chest pressure or shortness of breath. She does have some dyspnea on exertion. She does sleep on 2 pillows. She denies any paroxysmal nocturnal dyspnea. She denies any personal or family history of blood clots. She does have sleep apnea and uses her CPAP. Oxygen is mixed in with it. Dr. Young is her pulmonologist. She does have gastroesophageal reflux disease and takes medication daily. She states that she has lots of gas in her abdomen. She takes linzess. She states that she has IBS-constipation. She averages a bowel movement every other day. She has had an appendectomy and laparoscopic cholecystectomy. She is due for a colonoscopy. Her last one was about 10 years ago. She is scheduled to see [facility] later this month. She reports that she has had 2 prior upper endoscopies more than 10 years ago. She states on her first upper endoscopy she was told that she had Barrett's esophagus. On a subsequent repeat endoscopy about a year later she states that she was told she had no evidence of Barrett's disease. I do not have those records. I was able to find the operative note from 2006 from [place] from endoscopy Center that showed 2 lines of gastric mucosa proximal in the esophagus. However I do not have the biopsy results. She is a G0 P0. She denies any melena or hematochezia. She denies any dysuria or hematuria. She has left shoulder pain as well as bilateral knee pain. Her left knee pain is worse than the right. She takes pills for diabetes. She also takes propranolol for migraines. She denies any TIAs or amaurosis fugax.  She denies any tobacco or alcohol use. She is on disability.    She had some blood work done about a week and a half ago. Her total cholesterol level was 179, triglyceride level 450, HDL 32, vitamin D 44. AST 42, ALT 40, hemoglobin A1c 7.2. TSH was normal at 2.33 She had an abdominal ultrasound back  in 2015 which showed fatty liver. She also had a remote gastric emptying study in 2006 which showed some evidence of gastroparesis. She had 46% remaining after 2 hours.   Problem List/Past Medical (Aivan Fillingim M. Tyrea Froberg, MD; 03/17/2017 10:00 AM) OSTEOARTHRITIS, LOCALIZED, KNEE (M17.10) METABOLIC SYNDROME (E88.81) BIPOLAR DISEASE, CHRONIC (F31.9) MORBID OBESITY WITH BMI OF 50.0-59.9, ADULT (E66.01)  Past Surgical History (Yassmin Binegar M. Emmersen Garraway, MD; 03/17/2017 10:00 AM) Appendectomy Gallbladder Surgery - Laparoscopic Shoulder Surgery Left. Tonsillectomy  Diagnostic Studies History (Hollee Fate M. Tanya Marvin, MD; 03/17/2017 10:00 AM) Colonoscopy >10 years ago Mammogram within last year Pap Smear 1-5 years ago  Allergies (Kyrene Longan M. Ronte Parker, MD; 03/17/2017 10:00 AM) Codeine and Related Statins Ketoconazole (bulk) *CHEMICALS* Pravachol *ANTIHYPERLIPIDEMICS*  Medication History (Shaka Zech M. Melaya Hoselton, MD; 03/17/2017 10:00 AM) Medications Reconciled OxyCODONE HCl (5MG/5ML Solution, 5-10 Milliliter Oral every four hours, as needed, Taken starting 03/17/2017) Active. Zofran ODT (4MG Tablet Disint, 1 (one) Tablet Disperse Oral every six hours, as needed, Taken starting 03/17/2017) Active. Diclofenac Sodium (1% Gel, Transdermal) Active. Ezetimibe (10MG Tablet, Oral) Active. Fenofibrate Micronized (134MG Capsule, Oral) Active. Ferrous Gluconate (324 (38 Fe)MG Tablet, Oral) Active. Fluticasone Furoate (100MCG/ACT Aero Pow Br Act, Inhalation) Active. Furosemide (20MG Tablet, Oral) Active. Gabapentin (300MG Capsule, Oral) Active. Levothyroxine Sodium (150MCG Tablet, Oral) Active. Linzess (145MCG Capsule, Oral) Active. MetFORMIN HCl (500MG Tablet, Oral) Active. Multiple Vitamin (Oral) Active. Healthy Colon (Oral) Active. Linaclotide (145MCG Capsule, Oral) Active. OneTouch Ultra Blue (In Vitro) Active. Omeprazole (40MG Capsule DR, Oral) Active. Pazeo (0.7% Solution, Ophthalmic) Active. Potassium  Chloride ER (10MEQ Tablet ER, Oral) Active. Propranolol HCl (60MG Tablet, Oral) Active. Restasis Multidose (0.05% Emulsion, Ophthalmic) Active. Sertraline HCl (100MG Tablet, Oral) Active. SUMAtriptan Succinate (6MG/0.5ML Soln Auto-inj, Subcutaneous) Active. TiZANidine HCl (2MG Tablet, Oral) Active. TraMADol HCl (50MG Tablet, Oral) Active. Valsartan-Hydrochlorothiazide (160-25MG Tablet, Oral) Active. Ziprasidone HCl (60MG Capsule, Oral) Active. Vitamin D (2000UNIT Capsule, Oral) Active. Nystatin (6MU/GM Powder,) Active.  Social History (Derick Seminara M. Clennon Nasca, MD; 03/17/2017 10:00 AM) Caffeine use Tea. No alcohol use No drug use Tobacco use Never smoker.  Family History (Timiyah Romito M. Yenni Carra, MD; 03/17/2017 10:00 AM) Anesthetic complications Sister. Arthritis Brother, Father, Mother, Sister. Bleeding disorder Father. Cerebrovascular Accident Mother, Sister. Colon Cancer Sister. Colon Polyps Father, Sister. Depression Father, Mother, Sister. Diabetes Mellitus Brother, Father, Mother, Sister. Heart Disease Father. Hypertension Brother, Father, Mother, Sister.  Pregnancy / Birth History (Nichlos Kunzler M. Leeann Bady, MD; 03/17/2017 10:00 AM) Age at menarche 12 years. Gravida 0 Irregular periods  Other Problems (Shawniece Oyola M. Tiffanye Hartmann, MD; 03/17/2017 10:00 AM) Anxiety Disorder Arthritis Back Pain Depression Home Oxygen Use Hypercholesterolemia Other disease, cancer, significant illness HEADACHE, VARIANT MIGRAINE (G43.809) HYPOTHYROIDISM, ADULT (E03.9) DIABETES MELLITUS TYPE 2 IN OBESE (E11.69) GASTROESOPHAGEAL REFLUX DISEASE, ESOPHAGITIS PRESENCE NOT SPECIFIED (K21.9) OBSTRUCTIVE SLEEP APNEA ON CPAP (G47.33) ESSENTIAL HYPERTENSION (I10)     Review of Systems (Emerita Berkemeier M. Dedria Endres MD; 03/17/2017 9:57 AM) General Present- Fatigue, Night Sweats, Weight Gain and Weight Loss. Not Present- Appetite Loss, Chills and Fever. HEENT Present- Seasonal Allergies, Sinus Pain and Wears  glasses/contact lenses. Not Present- Earache, Hearing Loss, Hoarseness, Nose Bleed, Oral Ulcers, Ringing in the Ears, Sore Throat, Visual Disturbances and Yellow Eyes. Respiratory Present- Snoring. Not Present- Bloody sputum, Chronic Cough, Difficulty Breathing and Wheezing. Breast   Not Present- Breast Mass, Breast Pain, Nipple Discharge and Skin Changes. Cardiovascular Present- Difficulty Breathing Lying Down. Not Present- Chest Pain, Leg Cramps, Palpitations, Rapid Heart Rate, Shortness of Breath and Swelling of Extremities. Gastrointestinal Present- Bloating, Constipation, Excessive gas, Gets full quickly at meals and Nausea. Not Present- Abdominal Pain, Bloody Stool, Change in Bowel Habits, Chronic diarrhea, Difficulty Swallowing, Hemorrhoids, Indigestion, Rectal Pain and Vomiting. Female Genitourinary Not Present- Frequency, Nocturia, Painful Urination, Pelvic Pain and Urgency. Musculoskeletal Present- Back Pain, Joint Pain and Swelling of Extremities. Not Present- Joint Stiffness, Muscle Pain and Muscle Weakness. Neurological Present- Headaches. Not Present- Decreased Memory, Fainting, Numbness, Seizures, Tingling, Tremor, Trouble walking and Weakness. Psychiatric Present- Anxiety, Bipolar and Depression. Not Present- Change in Sleep Pattern, Fearful and Frequent crying. Endocrine Present- Hot flashes. Not Present- Cold Intolerance, Excessive Hunger, Hair Changes, Heat Intolerance and New Diabetes. Hematology Not Present- Blood Thinners, Easy Bruising, Excessive bleeding, Gland problems, HIV and Persistent Infections.  Vitals (Alisha Spillers CMA; 03/17/2017 9:04 AM) 03/17/2017 9:03 AM Weight: 258.4 lb Height: 64.25in Body Surface Area: 2.19 m Body Mass Index: 44.01 kg/m  Temp.: 98F(Oral)  Pulse: 58 (Regular)  BP: 122/82 (Sitting, Left Arm, Standard)      Physical Exam (Zosia Lucchese M. Kallon Caylor MD; 03/17/2017 9:54 AM)  General Mental Status-Alert. General Appearance-Consistent  with stated age. Hydration-Well hydrated. Voice-Normal. Note: morbidly obese; mainly central truncal  Head and Neck Head-normocephalic, atraumatic with no lesions or palpable masses. Trachea-midline. Thyroid Gland Characteristics - normal size and consistency.  Eye Eyeball - Bilateral-Extraocular movements intact. Sclera/Conjunctiva - Bilateral-No scleral icterus.  ENMT Note: dentition adequate; ears: normal external ears.  Chest and Lung Exam Chest and lung exam reveals -quiet, even and easy respiratory effort with no use of accessory muscles and on auscultation, normal breath sounds, no adventitious sounds and normal vocal resonance. Inspection Chest Wall - Normal. Back - normal.  Breast - Did not examine.  Cardiovascular Cardiovascular examination reveals -normal heart sounds, regular rate and rhythm with no murmurs and normal pedal pulses bilaterally.  Abdomen Inspection Inspection of the abdomen reveals - No Hernias. Skin - Scar - Right Lower Quadrant(old appy scar) and no surgical scars. Palpation/Percussion Palpation and Percussion of the abdomen reveal - Soft, Non Tender, No Rebound tenderness, No Rigidity (guarding) and No hepatosplenomegaly. Auscultation Auscultation of the abdomen reveals - Bowel sounds normal.  Peripheral Vascular Upper Extremity Palpation - Pulses bilaterally normal.  Neurologic Neurologic evaluation reveals -alert and oriented x 3 with no impairment of recent or remote memory. Mental Status-Normal.  Neuropsychiatric The patient's mood and affect are described as -normal. Judgment and Insight-insight is appropriate concerning matters relevant to self.  Musculoskeletal Normal Exam - Left-Upper Extremity Strength Normal and Lower Extremity Strength Normal. Normal Exam - Right-Upper Extremity Strength Normal and Lower Extremity Strength Normal. Note: Left knee crepitus left knee crepitus  Lymphatic Head  & Neck  General Head & Neck Lymphatics: Bilateral - Description - Normal. Axillary - Did not examine. Femoral & Inguinal - Did not examine.    Assessment & Plan (Cherylann Hobday M. Almetta Liddicoat MD; 03/17/2017 9:59 AM)  MORBID OBESITY WITH BMI OF 50.0-59.9, ADULT (E66.01) Impression: We reviewed her preoperative workup. She is missing her upper GI she will go today for that. She had an upper endoscopy in 2013 which showed no evidence of a hiatal hernia as well as no evidence of Barrett's esophagus. I congratulated her on her weight loss. We discussed the typical surgery process as well as typical postoperative course. All of her questions   were asked and answered. She was given her postoperative prescriptions today.  Current Plans Pt Education - EMW_preopbariatric OSTEOARTHRITIS, LOCALIZED, KNEE (M17.10)  BIPOLAR DISEASE, CHRONIC (F31.9) Impression: seems stable. no recent hospitalizations per patient. psychological clearance recvd  HEADACHE, VARIANT MIGRAINE (G43.809)  HYPOTHYROIDISM, ADULT (E03.9)  OBSTRUCTIVE SLEEP APNEA ON CPAP (G47.33)  ESSENTIAL HYPERTENSION (I10)  GASTROESOPHAGEAL REFLUX DISEASE, ESOPHAGITIS PRESENCE NOT SPECIFIED (K21.9) Impression: There was a remote question of Barrett's esophagus however the patient had an upper endoscopy in 2013 which showed no evidence of Barrett's esophagus and no hiatal hernia. However given her gastroparesis I do think of Roux-en-Y gastric bypass is the better surgical option as opposed to a sleeve gastrectomy.  Dedra Matsuo M. Charene Mccallister, MD, FACS General, Bariatric, & Minimally Invasive Surgery [name]   

## 2017-03-21 NOTE — Op Note (Signed)
KYLEAH PENSABENE [id number] Oct 16, 1966. 03/21/2017  Preoperative diagnosis:  Principal Problem:   Morbid obesity (BMI 44) Active Problems:   Obstructive sleep apnea   Essential hypertension   Bilateral chronic knee pain   DM (diabetes mellitus) (HCC)   HLD (hyperlipidemia)   Bipolar disorder (HCC)   Metabolic syndrome   Fatty liver disease, nonalcoholic   GERD (gastroesophageal reflux disease)  Postoperative  diagnosis:  1. Same + hiatal hernia   Surgical procedure: Laparoscopic Roux-en-Y gastric bypass (ante-colic, ante-gastric) with hiatal hernia repair; upper endoscopy  Surgeon: Gayland Curry, M.D. FACS  Asst.: Alphonsa Overall MD FACS; Camillia Herter Plocki PA-S  Anesthesia: General plus exparel  Complications: None   EBL: Minimal   Drains: None   Disposition: PACU in good condition   Indications for procedure: 51yo female with morbid obesity who has been unsuccessful at sustained weight loss. The patient's comorbidities are listed above. We discussed the risk and benefits of surgery including but not limited to anesthesia risk, bleeding, infection, blood clot formation, anastomotic leak, anastomotic stricture, ulcer formation, death, respiratory complications, intestinal blockage, internal hernia, gallstone formation, vitamin and nutritional deficiencies, injury to surrounding structures, failure to lose weight and mood changes.   Description of procedure: Patient is brought to the operating room and general anesthesia induced. The patient had received preoperative broad-spectrum IV antibiotics and subcutaneous heparin. The abdomen was widely sterilely prepped with Chloraprep and draped. Patient timeout was performed and correct patient and procedure confirmed. Access was obtained with a 12 mm Optiview trocar in the left upper quadrant and pneumoperitoneum established without difficulty. Under direct vision 12 mm trocars were placed laterally in the right upper quadrant, right upper  quadrant midclavicular line, and to the left and above the umbilicus for the camera port. A 5 mm trocar was placed laterally in the left upper quadrant. Exparel was infiltrated in bilateral upper abdominal walls as a TAPP block.  The omentum was brought into the upper abdomen and the transverse mesocolon elevated and the ligament of Treitz clearly identified. A 40 cm biliopancreatic limb was then carefully measured from the ligament of Treitz. The small intestine was divided at this point with a single firing of the white load linear stapler. A Penrose drain was sutured to the end of the Roux-en-Y limb for later identification. A 100 cm Roux-en-Y limb was then carefully measured. At this point a side-to-side anastomosis was created between the Roux limb and the end of the biliopancreatic limb. This was accomplished with a single firing of the 60 mm white load linear stapler. The common enterotomy was closed with a running 2-0 Vicryl begun at either end of the enterotomy and tied centrally. Eviceal tissue sealant was placed over the anastomosis. The mesenteric defect was then closed with running 2-0 silk. The omentum was then divided with the harmonic scalpel up towards the transverse colon to allow mobility of the Roux limb toward the gastric pouch. The patient was then placed in steep reversed Trendelenburg. Through a 5 mm subxiphoid site the [facility] retractor was placed and the left lobe of the liver elevated with excellent exposure of the upper stomach and hiatus.  There was an obvious anterior dimple that was visible. There was a fair amount of perigastric fat tissue herniating thru the hiatal hernia defect. The perigastric fat tissue was reduced from the hiatus. The gastrohepatic ligament was incised with harmonic scalpel. The right crus was identified. We identified the crossing fat along the right crus. The adipose tissue just above this  area was incised with harmonic scalpel. I then bluntly dissected out  this area and identified the left crus. A penrose drain was passed retrogastric to facilitate traction on the stomach and to aid in visualization of the hiatus structures. I then mobilized the esophagus.The left and right crura and aorta were clearly identified. The perigastric fat tissue had been completely reduced.  I was then able to reapproximate the left and right crus with 0 Ethibond using an Endostitch suture device and securing it with a titanium tyknot. I placed a second and third suture in a similar fashion. The calibration tube was placed in the oropharynx and guided down into the stomach by the CRNA. 10 mL of air was insufflated into the calibration balloon.  The calibration tube was then gently pulled back and there was resistance at the GE junction. The tube did not slide back up into the esophagus. At this point the calibration tubing was deflated and removed from the patient's body.   The angle of Hiss was then mobilized with the harmonic scalpel. A 4-5 cm gastric pouch was then carefully measured along the lesser curve of the stomach. Dissection was carried along the lesser curve at this point with the Harmonic scalpel working carefully back toward the lesser sac at right angles to the lesser curve. The free lesser sac was then entered. After being sure all tubes were removed from the stomach an initial firing of the gold load 60 mm linear stapler was fired at right angles across the lesser curve for about 4 cm. The gastric pouch was further mobilized posteriorly and then the pouch was completed with 3 further firings of the 60 mm blue load linear stapler  up through the previously dissected angle of His. It was ensured that the pouch was completely mobilized away from the gastric remnant. This created a nice tubular 4-5 cm gastric pouch.  The Roux limb was then brought up in an antecolic fashion with the candycane facing to the patient's left without undue tension. The gastrojejunostomy was  created with an initial posterior row of 2-0 Vicryl between the Roux limb and the staple line of the gastric pouch. Enterotomies were then made in the gastric pouch and the Roux limb with the harmonic scalpel and an approximately 2-2-1/2 cm anastomosis was created with a single firing of the 54mm blue load linear stapler. The staple line was inspected and was intact without bleeding. The common enterotomy was then closed with running 2-0 Vicryl begun at either end and tied centrally. The Ewall tube was then easily passed through the anastomosis and an outer anterior layer of running 2-0 Vicryl was placed. The Ewald tube was removed. With the outlet of the gastrojejunostomy clamped and under saline irrigation the assistant performed upper endoscopy and with the gastric pouch tensely distended with air-there was no evidence of leak on this test. The pouch was desufflated. The Terance Hart defect was closed with running 2-0 silk. The abdomen was inspected for any evidence of bleeding or bowel injury and everything looked fine. The Nathanson retractor was removed under direct vision after coating the anastomosis with Eviceal tissue sealant. All CO2 was evacuated and trochars removed. Skin incisions were closed with 4-0 monocryl in a subcuticular fashion followed by benzoin, steri-strips, and bandages. Sponge needle and instrument counts were correct. The patient was taken to the PACU in good condition.    Leighton Ruff. Redmond Pulling, MD, FACS General, Bariatric, & Minimally Invasive Surgery [facility], [place]

## 2017-03-21 NOTE — Interval H&P Note (Signed)
History and Physical Interval Note:  03/21/2017 7:09 AM  Morgan [name]  has presented today for surgery, with the diagnosis of Morbid Obesity, HTN, GERD, DM,  OSA, NASH, Bipolar  The various methods of treatment have been discussed with the patient and family. After consideration of risks, benefits and other options for treatment, the patient has consented to  Procedure(s): LAPAROSCOPIC ROUX-EN-Y GASTRIC, UPPER ENDO (N/A) as a surgical intervention .  The patient's history has been reviewed, patient examined, no change in status, stable for surgery.  I have reviewed the patient's chart and labs.  Questions were answered to the patient's satisfaction.    Leighton Ruff. Redmond Pulling, MD, [place], Bariatric, & Minimally Invasive Surgery [facility], [place]  [facility]

## 2017-03-21 NOTE — Anesthesia Postprocedure Evaluation (Signed)
Anesthesia Post Note  Patient: JESSICAMARIE AMIRI  Procedure(s) Performed: Procedure(s) (LRB): LAPAROSCOPIC ROUX-EN-Y GASTRIC, UPPER ENDO (N/A)  Patient location during evaluation: PACU Anesthesia Type: General Level of consciousness: awake and alert Pain management: pain level controlled Vital Signs Assessment: post-procedure vital signs reviewed and stable Respiratory status: spontaneous breathing, nonlabored ventilation, respiratory function stable and patient connected to nasal cannula oxygen Cardiovascular status: blood pressure returned to baseline and stable Postop Assessment: no signs of nausea or vomiting Anesthetic complications: no       Last Vitals:  Vitals:   03/21/17 1404 03/21/17 1502  BP: (!) 106/59 (!) 109/55  Pulse: 64 71  Resp: 14 16  Temp: 36.8 C 36.8 C    Last Pain:  Vitals:   03/21/17 1522  TempSrc:   PainSc: [place]

## 2017-03-21 NOTE — Op Note (Signed)
Name:  DAWNN NAM MRN: [id number] Date of Surgery: 03/21/2017  Preop Diagnosis:  Morbid Obesity, S/P RYGB  Postop Diagnosis:  Morbid Obesity, S/P RYGB (Weight - 258, BMI - 44)  Procedure:  Upper endoscopy  (Intraoperative)  Surgeon:  Alphonsa Overall, M.D.  Anesthesia:  GET  Indications for procedure: Morgan [name] is a 51 y.o. female whose primary care physician is Kenn File, MD and has completed a Roux-en-Y gastric bypass today by Dr. Redmond Pulling.  I am doing an intraoperative upper endoscopy to evaluate the gastric pouch and the gastro-jejunal anastomosis.  Operative Note: The patient is under general anesthesia.  Dr. Redmond Pulling is laparoscoping the patient while I do an upper endoscopy to evaluate the stomach pouch and gastrojejunal anastomosis.  With the patient intubated, I passed the Pentax endoscope without difficulty down the esophagus.  The esophago-gastric junction was at 39 cm.  The gastro-jejunal anastomosis was at 44 cm.  The mucosa of the stomach looked viable and the staple line was intact without bleeding.  The gastro-jejunal anastomosis looked okay.  While I insufflated the stomach pouch with air, Dr. Redmond Pulling clamped off the efferent limb of the jejunum.  He then flooded the upper abdomen with saline to put the gastric pouch and gastro-jejunal anastomosis under saline.  There was no bubbling or evidence of a leak.    The scope was then withdrawn.  The esophagus was unremarkable and the patient tolerated the endoscopy without difficulty.  Alphonsa Overall, MD, [facility] Pager: [phone] Office phone:  [phone]

## 2017-03-22 LAB — CBC WITH DIFFERENTIAL/PLATELET
BASOS PCT: 0 %
Basophils Absolute: 0 10*3/uL (ref 0.0–0.1)
EOS ABS: 0 10*3/uL (ref 0.0–0.7)
Eosinophils Relative: 0 %
HCT: 36.4 % (ref 36.0–46.0)
HEMOGLOBIN: 11.4 g/dL — AB (ref 12.0–15.0)
LYMPHS ABS: 0.9 10*3/uL (ref 0.7–4.0)
Lymphocytes Relative: 13 %
MCH: 27 pg (ref 26.0–34.0)
MCHC: 31.3 g/dL (ref 30.0–36.0)
MCV: 86.1 fL (ref 78.0–100.0)
Monocytes Absolute: 0.7 10*3/uL (ref 0.1–1.0)
Monocytes Relative: 10 %
NEUTROS PCT: 77 %
Neutro Abs: 5.3 10*3/uL (ref 1.7–7.7)
Platelets: 242 10*3/uL (ref 150–400)
RBC: 4.23 MIL/uL (ref 3.87–5.11)
RDW: 13.4 % (ref 11.5–15.5)
WBC: 6.9 10*3/uL (ref 4.0–10.5)

## 2017-03-22 LAB — GLUCOSE, CAPILLARY
GLUCOSE-CAPILLARY: 113 mg/dL — AB (ref 65–99)
GLUCOSE-CAPILLARY: 110 mg/dL — AB (ref 65–99)
Glucose-Capillary: 111 mg/dL — ABNORMAL HIGH (ref 65–99)
Glucose-Capillary: 130 mg/dL — ABNORMAL HIGH (ref 65–99)
Glucose-Capillary: 131 mg/dL — ABNORMAL HIGH (ref 65–99)
Glucose-Capillary: 132 mg/dL — ABNORMAL HIGH (ref 65–99)

## 2017-03-22 LAB — COMPREHENSIVE METABOLIC PANEL
ALBUMIN: 3.5 g/dL (ref 3.5–5.0)
ALK PHOS: 48 U/L (ref 38–126)
ALT: 113 U/L — AB (ref 14–54)
AST: 142 U/L — ABNORMAL HIGH (ref 15–41)
Anion gap: 6 (ref 5–15)
BUN: 8 mg/dL (ref 6–20)
CHLORIDE: 100 mmol/L — AB (ref 101–111)
CO2: 33 mmol/L — ABNORMAL HIGH (ref 22–32)
Calcium: 8.6 mg/dL — ABNORMAL LOW (ref 8.9–10.3)
Creatinine, Ser: 0.79 mg/dL (ref 0.44–1.00)
GFR calc Af Amer: >60 mL/min (ref >60)
Glucose, Bld: 130 mg/dL — ABNORMAL HIGH (ref 65–99)
POTASSIUM: 3.2 mmol/L — AB (ref 3.5–5.1)
SODIUM: 139 mmol/L (ref 135–145)
Total Bilirubin: 0.4 mg/dL (ref 0.3–1.2)
Total Protein: 6.4 g/dL — ABNORMAL LOW (ref 6.5–8.1)

## 2017-03-22 LAB — HEMOGLOBIN AND HEMATOCRIT, BLOOD
HEMATOCRIT: 36.7 % (ref 36.0–46.0)
HEMOGLOBIN: 11.6 g/dL — AB (ref 12.0–15.0)

## 2017-03-22 MED ORDER — SIMETHICONE 40 MG/0.6ML PO SUSP
40.0000 mg | Freq: Four times a day (QID) | ORAL | Status: DC | PRN
  Administered 2017-03-22 – 2017-03-23 (×3): 40 mg via ORAL
  Filled 2017-03-22 (×6): qty 0.6

## 2017-03-22 MED ORDER — POTASSIUM CHLORIDE 10 MEQ/100ML IV SOLN
10.0000 meq | INTRAVENOUS | Status: AC
  Administered 2017-03-22 (×3): 10 meq via INTRAVENOUS
  Filled 2017-03-22 (×3): qty 100

## 2017-03-22 MED ORDER — SODIUM CHLORIDE 0.9 % IV SOLN: 30.0000 meq | Freq: Once | INTRAVENOUS | Status: DC

## 2017-03-22 NOTE — Progress Notes (Addendum)
Patient continues to complain of strong pain between 6-10 morphine and oxy doses were doubled from morning doses but still no relief. Rocking chair was brought into patients room to try and relieve the gas, pt is walking in hallways and states that helps with her pain. Pt was napping at 1630 but woke with pain 9/10.

## 2017-03-22 NOTE — Progress Notes (Addendum)
Patient alert and oriented, pain is controlled. Patient is tolerating fluids  Reviewed Gastric Bypass discharge instructions with patient and patient is able to articulate understanding. Provided information on BELT program, Support Group and [facility] outpatient pharmacy. All questions answered, will continue to monitor.

## 2017-03-22 NOTE — Plan of Care (Signed)
Problem: Food- and Nutrition-Related Knowledge Deficit (NB-1.1) Goal: Nutrition education Formal process to instruct or train a patient/client in a skill or to impart knowledge to help patients/clients voluntarily manage or modify food choices and eating behavior to maintain or improve health. Outcome: Completed/Met Date Met: 03/22/17 Nutrition Education Note  Received consult for diet education per DROP protocol.   Discussed 2 week post op diet with pt. Emphasized that liquids must be non carbonated, non caffeinated, and sugar free. Fluid goals discussed. Pt to follow up with outpatient bariatric RD for further diet progression after 2 weeks. Multivitamins and minerals also reviewed. Teach back method used, pt expressed understanding, expect good compliance.   Diet: First 2 Weeks  You will see the nutritionist about two (2) weeks after your surgery. The nutritionist will increase the types of foods you can eat if you are handling liquids well:  If you have severe vomiting or nausea and cannot handle clear liquids lasting longer than 1 day, call your surgeon  Protein Shake  Drink at least 2 ounces of shake 5-6 times per day  Each serving of protein shakes (usually 8 - 12 ounces) should have a minimum of:  15 grams of protein  And no more than 5 grams of carbohydrate  Goal for protein each day:  Men = 80 grams per day  Women = 60 grams per day  Protein powder may be added to fluids such as non-fat milk or Lactaid milk or Soy milk (limit to 35 grams added protein powder per serving)   Hydration  Slowly increase the amount of water and other clear liquids as tolerated (See Acceptable Fluids)  Slowly increase the amount of protein shake as tolerated  Sip fluids slowly and throughout the day  May use sugar substitutes in small amounts (no more than 6 - 8 packets per day; i.e. Splenda)   Fluid Goal  The first goal is to drink at least 8 ounces of protein shake/drink per day (or as directed  by the nutritionist); some examples of protein shakes are Premier Protein, Syntrax Nectar, Adkins Advantage, EAS Edge HP, and Unjury. See handout from pre-op Bariatric Education Class:  Slowly increase the amount of protein shake you drink as tolerated  You may find it easier to slowly sip shakes throughout the day  It is important to get your proteins in first  Your fluid goal is to drink 64 - 100 ounces of fluid daily  It may take a few weeks to build up to this  32 oz (or more) should be clear liquids  And  32 oz (or more) should be full liquids (see below for examples)  Liquids should not contain sugar, caffeine, or carbonation   Clear Liquids:  Water or Sugar-free flavored water (i.e. Fruit H2O, Propel)  Decaffeinated coffee or tea (sugar-free)  Crystal Lite, Wyler's Lite, Minute Maid Lite  Sugar-free Jell-O  Bouillon or broth  Sugar-free Popsicle: *Less than 20 calories each; Limit 1 per day   Full Liquids:  Protein Shakes/Drinks + 2 choices per day of other full liquids  Full liquids must be:  No More Than 12 grams of Carbs per serving  No More Than 3 grams of Fat per serving  Strained low-fat cream soup  Non-Fat milk  Fat-free Lactaid Milk  Sugar-free yogurt (Dannon Lite & Fit, Greek yogurt, Oikos Zero)    , MS, RD, LDN Pager: [phone] After Hours Pager: [phone]     

## 2017-03-22 NOTE — Progress Notes (Signed)
Patient alert and oriented, Post op day 1.  Provided support and encouragement.  Encouraged pulmonary toilet, ambulation and small sips of liquids.  All questions answered.  Will continue to monitor. 

## 2017-03-22 NOTE — Progress Notes (Signed)
1 Day Post-Op Gastric Bypass and Hiatal Hernia repair Subjective: Nausea, abdominal cramping, belching, gas Discomfort and nausea with consumption of broth, apple juice, and jello Intermittent Pain 9/10 at its worst  Objective: Vital signs in last 24 hours: Temp:  [97.5 F (36.4 C)-98.7 F (37.1 C)] 98 F (36.7 C) (04/11 0539) Pulse Rate:  [58-81] 64 (04/11 0539) Resp:  [13-19] 19 (04/11 0539) BP: (103-152)/(55-89) 115/67 (04/11 0539) SpO2:  [91 %-100 %] 93 % (04/11 0539) Weight:  [118.6 kg (261 lb 8 oz)] 118.6 kg (261 lb 8 oz) (04/11 0539) Last BM Date: 03/20/17  Intake/Output from previous day: 04/10 0701 - 04/11 0700 In: 2576.3 [P.O.:120; I.V.:2456.3] Out: 2325 [Urine:2275; Blood:50] Intake/Output this shift: No intake/output data recorded.  General: Pt. Appears stated age, appropriate affect, in no acute distress Resp: Lungs clear to auscultations in all fields. Card: Normal heart sounds, no MRG, Normal pulse rate and rhythm GI: Abdominal soft, TTP appropriately around incision sites Incisions: TTP, Clean, no drainage, no erythema, no warm, no induration  Lab Results:   Recent Labs  03/21/17 1053 03/22/17 0501  WBC  --  6.9  HGB 12.2 11.4*  HCT 37.5 36.4  PLT  --  242   BMET  Recent Labs  03/22/17 0501  NA 139  K 3.2*  CL 100*  CO2 33*  GLUCOSE 130*  BUN 8  CREATININE 0.79  CALCIUM 8.6*   PT/INR No results for input(s): LABPROT, INR in the last 72 hours. ABG No results for input(s): PHART, HCO3 in the last 72 hours.  Invalid input(s): PCO2, PO2  Studies/Results: No results found.  Anti-infectives: Anti-infectives    Start     Dose/Rate Route Frequency Ordered Stop   03/21/17 0655  cefoTEtan in Dextrose 5% (CEFOTAN) 2-2.08 GM-% IVPB    Comments:  Carleene Cooper   : cabinet override      03/21/17 0655 03/21/17 0740   03/21/17 0520  cefoTEtan in Dextrose 5% (CEFOTAN) IVPB 2 g     2 g Intravenous On call to O.R. 03/21/17 0520 03/21/17 0755       Assessment/Plan: s/p Procedure(s): LAPAROSCOPIC ROUX-EN-Y GASTRIC, UPPER ENDO (N/A)  Restrict diet to water only this morning. Advance diet later in day if able to tolerate water better than last night. Re-assess: CBC- Hemoglobin prior to discharge  Plan for discharge tomorrow  LOS: 1 day    Neena Beecham 03/22/2017

## 2017-03-23 LAB — GLUCOSE, CAPILLARY
GLUCOSE-CAPILLARY: 136 mg/dL — AB (ref 65–99)
GLUCOSE-CAPILLARY: 148 mg/dL — AB (ref 65–99)
Glucose-Capillary: 127 mg/dL — ABNORMAL HIGH (ref 65–99)

## 2017-03-23 LAB — COMPREHENSIVE METABOLIC PANEL
ALT: 148 U/L — AB (ref 14–54)
ANION GAP: 8 (ref 5–15)
AST: 131 U/L — ABNORMAL HIGH (ref 15–41)
Albumin: 3.5 g/dL (ref 3.5–5.0)
Alkaline Phosphatase: 50 U/L (ref 38–126)
BUN: <5 mg/dL — ABNORMAL LOW (ref 6–20)
CO2: 29 mmol/L (ref 22–32)
CREATININE: 0.69 mg/dL (ref 0.44–1.00)
Calcium: 8.4 mg/dL — ABNORMAL LOW (ref 8.9–10.3)
Chloride: 101 mmol/L (ref 101–111)
Glucose, Bld: 150 mg/dL — ABNORMAL HIGH (ref 65–99)
Potassium: 3.3 mmol/L — ABNORMAL LOW (ref 3.5–5.1)
SODIUM: 138 mmol/L (ref 135–145)
Total Bilirubin: 0.7 mg/dL (ref 0.3–1.2)
Total Protein: 6.7 g/dL (ref 6.5–8.1)

## 2017-03-23 LAB — CBC WITH DIFFERENTIAL/PLATELET
Basophils Absolute: 0 10*3/uL (ref 0.0–0.1)
Basophils Relative: 0 %
EOS ABS: 0 10*3/uL (ref 0.0–0.7)
EOS PCT: 1 %
HCT: 34.9 % — ABNORMAL LOW (ref 36.0–46.0)
Hemoglobin: 10.9 g/dL — ABNORMAL LOW (ref 12.0–15.0)
LYMPHS ABS: 0.9 10*3/uL (ref 0.7–4.0)
LYMPHS PCT: 16 %
MCH: 26.3 pg (ref 26.0–34.0)
MCHC: 31.2 g/dL (ref 30.0–36.0)
MCV: 84.3 fL (ref 78.0–100.0)
MONO ABS: 0.5 10*3/uL (ref 0.1–1.0)
MONOS PCT: 9 %
Neutro Abs: 4.2 10*3/uL (ref 1.7–7.7)
Neutrophils Relative %: 74 %
PLATELETS: 230 10*3/uL (ref 150–400)
RBC: 4.14 MIL/uL (ref 3.87–5.11)
RDW: 13.5 % (ref 11.5–15.5)
WBC: 5.7 10*3/uL (ref 4.0–10.5)

## 2017-03-23 MED ORDER — SUMATRIPTAN SUCCINATE 6 MG/0.5ML ~~LOC~~ SOLN
6.0000 mg | SUBCUTANEOUS | Status: DC | PRN
  Filled 2017-03-23: qty 0.5

## 2017-03-23 MED ORDER — OXYCODONE HCL 5 MG/5ML PO SOLN: 5.0000 mg | ORAL | 0 refills | Status: DC | PRN

## 2017-03-23 NOTE — Discharge Instructions (Signed)
MONITOR YOUR BLOOD PRESSURE DAILY.  If the top number (systolic) is consistently greater than 140 and/or the bottom number is greater than 90, resume your diovan blood pressure medication  If you can not take your GEODON capsule - do not OPEN capsule. Please call and let us know and we can try to prescribe a different form of it.  Do not take the linzess (constipation medication) right now.  We can resume that in a few weeks.    GASTRIC BYPASS/SLEEVE  Home Care Instructions   These instructions are to help you care for yourself when you go home.  Call: If you have any problems.  Call [phone] and ask for the surgeon on call  If you need immediate assistance come to the ER at [facility]. Tell the ER staff you are a new post-op gastric bypass or gastric sleeve patient  Signs and symptoms to report:  Severe  vomiting or nausea o If you cannot handle clear liquids for longer than 1 day, call your surgeon  Abdominal pain which does not get better after taking your pain medication  Fever greater than 100.4  F and chills  Heart rate over 100 beats a minute  Trouble breathing  Chest pain  Redness,  swelling, drainage, or foul odor at incision (surgical) sites  If your incisions open or pull apart  Swelling or pain in calf (lower leg)  Diarrhea (Loose bowel movements that happen often), frequent watery, uncontrolled bowel movements  Constipation, (no bowel movements for 3 days) if this happens: o Take Milk of Magnesia, 2 tablespoons by mouth, 3 times a day for 2 days if needed o Stop taking Milk of Magnesia once you have had a bowel movement o Call your doctor if constipation continues Or o Take Miralax  (instead of Milk of Magnesia) following the label instructions o Stop taking Miralax once you have had a bowel movement o Call your doctor if constipation continues  Anything you think is abnormal for you   Normal side effects after surgery:  Unable to sleep at  night or unable to concentrate  Irritability  Being tearful (crying) or depressed  These are common complaints, possibly related to your anesthesia, stress of surgery, and change in lifestyle, that usually go away a few weeks after surgery. If these feelings continue, call your medical doctor.  Wound Care: You may have surgical glue, steri-strips, or staples over your incisions after surgery  Surgical glue: Looks like clear film over your incisions and will wear off a little at a time  Steri-strips: Adhesive strips of tape over your incisions. You may notice a yellowish color on skin under the steri-strips. This is used to make the steri-strips stick better. Do not pull the steri-strips off - let them fall off  Staples: Staples may be removed before you leave the hospital o If you go home with staples, call [facility] for an appointment with your surgeons nurse to have staples removed 10 days after surgery, [phone]  Showering: You may shower two (2) days after your surgery unless your surgeon tells you differently o Wash gently around incisions with warm soapy water, rinse well, and gently pat dry o If you have a drain (tube from your incision), you may need someone to hold this while you shower o No tub baths until staples are removed and incisions are healed   Medications:  Medications should be liquid or crushed if larger than the size of a dime  Extended  release pills (medication that releases a little bit at a time through the  day) should not be crushed  Depending on the size and number of medications you take, you may need to space (take a few throughout the day)/change the time you take your medications so that you do not over-fill your pouch (smaller stomach)  Make sure you follow-up with you primary care physician to make medication changes needed during rapid weight loss and life -style changes  If you have diabetes, follow up with your doctor that  orders your diabetes medication(s) within one week after surgery and check your blood sugar regularly   Do not drive while taking narcotics (pain medications)   Do not take acetaminophen (Tylenol) and Roxicet or Lortab Elixir at the same time since these pain medications contain acetaminophen   Diet:  First 2 Weeks You will see the nutritionist about two (2) weeks after your surgery. The nutritionist will increase the types of foods you can eat if you are handling liquids well:  If you have severe vomiting or nausea and cannot handle clear liquids lasting longer than 1 day call your surgeon Protein Shake  Drink at least 2 ounces of shake 5-6 times per day  Each serving of protein shakes (usually 8-12 ounces) should have a minimum of: o 15 grams of protein o And no more than 5 grams of carbohydrate  Goal for protein each day: o Men = 80 grams per day o Women = 60 grams per day     Protein powder may be added to fluids such as non-fat milk or Lactaid milk or Soy milk (limit to 35 grams added protein powder per serving)  Hydration  Slowly increase the amount of water and other clear liquids as tolerated (See Acceptable Fluids)  Slowly increase the amount of protein shake as tolerated  Sip fluids slowly and throughout the day  May use sugar substitutes in small amounts (no more than 6-8 packets per day; i.e. Splenda)  Fluid Goal  The first goal is to drink at least 8 ounces of protein shake/drink per day (or as directed by the nutritionist); some examples of protein shakes are [employer], [employer], EAS Edge HP, and Unjury. - See handout from pre-op Bariatric Education Class: o Slowly increase the amount of protein shake you drink as tolerated o You may find it easier to slowly sip shakes throughout the day o It is important to get your proteins in first  Your fluid goal is to drink 64-100 ounces of fluid daily o It may take a few weeks to build up to this   32  oz. (or more) should be clear liquids And  32 oz. (or more) should be full liquids (see below for examples)  Liquids should not contain sugar, caffeine, or carbonation  Clear Liquids:  Water of Sugar-free flavored water (i.e. Fruit HO, Propel)  Decaffeinated coffee or tea (sugar-free)  Crystal lite, Wylers Lite, Minute Maid Lite  Sugar-free Jell-O  Bouillon or broth  Sugar-free Popsicle:    - Less than 20 calories each; Limit 1 per day  Full Liquids:                   Protein Shakes/Drinks + 2 choices per day of other full liquids  Full liquids must be: o No More Than 12 grams of Carbs per serving o No More Than 3 grams of Fat per serving  Strained low-fat cream soup  Non-Fat milk  Fat-free Lactaid  Milk  Sugar-free yogurt (Dannon Lite & Fit, Greek yogurt)    Vitamins and Minerals  Start 1 day after surgery unless otherwise directed by your surgeon  2 Chewable Multivitamin / Multimineral Supplement with iron (i.e. Centrum for Adults)  Vitamin B-12, 350-500 micrograms sub-lingual (place tablet under the tongue) each day  Chewable Calcium Citrate with Vitamin D-3 (Example: 3 Chewable Calcium  Plus 600 with Vitamin D-3) o Take 500 mg three (3) times a day for a total of 1500 mg each day o Do not take all 3 doses of calcium at one time as it may cause constipation, and you can only absorb 500 mg at a time o Do not mix multivitamins containing iron with calcium supplements;  take 2 hours apart o Do not substitute Tums (calcium carbonate) for your calcium  Menstruating women and those at risk for anemia ( a blood disease that causes weakness) may need extra iron o Talk to your doctor to see if you need more iron  If you need extra iron: Total daily Iron recommendation (including Vitamins) is 50 to 100 mg Iron/day  Do not stop taking or change any vitamins or minerals until you talk to your nutritionist or surgeon  Your nutritionist and/or surgeon must approve all  vitamin and mineral supplements   Activity and Exercise: It is important to continue walking at home. Limit your physical activity as instructed by your doctor. During this time, use these guidelines:  Do not lift anything greater than ten  (10) pounds for at least two (2) weeks  Do not go back to work or drive until Engineer, production says you can  You may have sex when you feel comfortable o It is VERY important for female patients to use a reliable birth control method; fertility often increase after surgery o Do not get pregnant for at least 18 months  Start exercising as soon as your doctor tells you that you can o Make sure your doctor approves any physical activity  Start with a simple walking program  Walk 5-15 minutes each day, 7 days per week  Slowly increase until you are walking 30-45 minutes per day  Consider joining our [place] program. [phone] or email [email or web address]   Special Instructions Things to remember:  Free counseling is available for you and your family through collaboration between [facility] and [place]. Please call [phone] and leave a message  Use your CPAP when sleeping if this applies to you  Consider buying a medical alert bracelet that says you had lap-band surgery     You will likely have your first fill (fluid added to your band) 6 - 8 weeks after surgery  [facility] has a free Bariatric Surgery Support Group that meets monthly, the 3rd Thursday, [place]. You can see classes online at [email or web address]  It is very important to keep all follow up appointments with your surgeon, nutritionist, primary care physician, and behavioral health practitioner o After the first year, please follow up with your bariatric surgeon and nutritionist at least once a year in order to maintain best weight loss results                    [facility]:  [place]: [phone]               Bariatric  Nurse Coordinator: [phone]  Gastric Bypass/Sleeve Home Care Instructions  Rev. 01/2013                                                         Reviewed and Endorsed                                                    by [facility] Patient Education Committee, Jan, 2014

## 2017-03-23 NOTE — Progress Notes (Signed)
2 Days Post-Op  Subjective: Still having some epigastric pain at times. Not necessarily related to food intake. Ambulating. Took in 4 oz of shake already this am  Objective: Vital signs in last 24 hours: Temp:  [97.9 F (36.6 C)-98.6 F (37 C)] 98.1 F (36.7 C) (04/12 0409) Pulse Rate:  [66-88] 88 (04/12 0409) Resp:  [17-18] 18 (04/12 0409) BP: (118-139)/(58-84) 118/64 (04/12 0409) SpO2:  [92 %-99 %] 92 % (04/12 0409) Weight:  [121.6 kg (268 lb 1.3 oz)] 121.6 kg (268 lb 1.3 oz) (04/12 0500) Last BM Date: 03/20/17  Intake/Output from previous day: 04/11 0701 - 04/12 0700 In: 2500 [P.O.:750; I.V.:1650; IV Piggyback:100] Out: 2175 [Urine:2175] Intake/Output this shift: No intake/output data recorded.  Alert, nontoxic, not ill appearing cta b/l Reg Soft, essentially nontender, some bruising in L abd; incisions c/d/I No edema  Lab Results:   Recent Labs  03/22/17 0501 03/22/17 1610 03/23/17 0505  WBC 6.9  --  5.7  HGB 11.4* 11.6* 10.9*  HCT 36.4 36.7 34.9*  PLT 242  --  230   BMET  Recent Labs  03/22/17 0501 03/23/17 0505  NA 139 138  K 3.2* 3.3*  CL 100* 101  CO2 33* 29  GLUCOSE 130* 150*  BUN 8 <5*  CREATININE 0.79 0.69  CALCIUM 8.6* 8.4*   PT/INR No results for input(s): LABPROT, INR in the last 72 hours. ABG No results for input(s): PHART, HCO3 in the last 72 hours.  Invalid input(s): PCO2, PO2  Studies/Results: No results found.  Anti-infectives: Anti-infectives    Start     Dose/Rate Route Frequency Ordered Stop   03/21/17 0655  cefoTEtan in Dextrose 5% (CEFOTAN) 2-2.08 GM-% IVPB    Comments:  Carleene Cooper   : cabinet override      03/21/17 0655 03/21/17 0740   03/21/17 0520  cefoTEtan in Dextrose 5% (CEFOTAN) IVPB 2 g     2 g Intravenous On call to O.R. 03/21/17 0520 03/21/17 0755      Assessment/Plan: s/p Procedure(s): LAPAROSCOPIC ROUX-EN-Y GASTRIC, UPPER ENDO (N/A)  Vitals ok. Wbc ok.  Cont diet as tolerated Discussed gas  pain is fairly typical Will see how does thru morning Discussed with pt need to monitor BP daily after dc and to resume BP medication if BP gets above 140/90.  Discussed other dc med changes  Leighton Ruff. Redmond Pulling, MD, FACS General, Bariatric, & Minimally Invasive Surgery [facility], [place]   LOS: 2 days    Gayland Curry 03/23/2017

## 2017-03-23 NOTE — Progress Notes (Signed)
Bariatric discharge education reviewed with patient.  Discussed with patient the need to monitor blood pressure and parameters to restart Diovan if needed, medications to stop taking including Linzess, and size of medications. Special emphasis to not open Geodon capsule. Patient restated information provided by nurse through teach back.  Questions answered. .  8 oz of Premiere protein tolerated without difficulty and patient states gas pain has decreased and no reported nausea. Concerns regarding migraine shared with Dr Redmond Pulling and medication ordered.  Patient provided with contact information for any further questions post discharge.

## 2017-03-27 DIAGNOSIS — G4733 Obstructive sleep apnea (adult) (pediatric): Secondary | ICD-10-CM | POA: Diagnosis not present

## 2017-03-27 NOTE — Discharge Summary (Signed)
Physician Discharge Summary  Morgan [name]:[id number] DOB: April 09, 1966 DOA: 03/21/2017  PCP: Kenn File, MD  Admit date: 03/21/2017 Discharge date: 03/23/2017  Recommendations for Outpatient Follow-up:    Follow-up Information    Gayland Curry, MD. Go on 04/13/2017.   Specialty:  General Surgery Why:  at 345 pm Contact information: [address] [place] [address] [place], MD Follow up.   Specialty:  General Surgery Contact information: [place] [address] [phone]          Discharge Diagnoses:  Principal Problem:   Morbid obesity ([place]) Active Problems:   Obstructive sleep apnea   Essential hypertension   Bilateral chronic knee pain   DM (diabetes mellitus) ([place])   HLD (hyperlipidemia)   Bipolar disorder ([facility])   Metabolic syndrome   Fatty liver disease, nonalcoholic   GERD (gastroesophageal reflux disease)   S/P gastric bypass with hiatal hernia repair   Surgical Procedure: Laparoscopic Roux-en-Y gastric bypass with hiatal hernia repair, upper endoscopy  Discharge Condition: Good Disposition: Home  Diet recommendation: Postoperative gastric bypass diet  Filed Weights   03/21/17 0526 03/22/17 0539 03/23/17 0500  Weight: 115.8 kg (255 lb 4 oz) 118.6 kg (261 lb 8 oz) 121.6 kg (268 lb 1.3 oz)     Hospital Course:  The patient was admitted for a planned laparoscopic Roux-en-Y gastric bypass. Found to have an incidental hiatal hernia and that was also repaired. Please see operative note. Preoperatively the patient was given 5000 units of subcutaneous heparin for DVT prophylaxis. Postoperative prophylactic Lovenox dosing was started on the morning of postoperative day 1.  The patient was started on ice chips and water on POD 0 as part of ERAS protocol. On postoperative day 1The patient's diet was advanced to protein shakes which they also tolerated. On POD 2, The patient was ambulating  without difficulty. Their vital signs are stable without fever or tachycardia. Their hemoglobin had remained stable. The patient was maintained on their home settings for CPAP therapy. The patient had received discharge instructions and counseling. They were deemed stable for discharge.   Discharge Instructions  Discharge Instructions    Ambulate hourly while awake    Complete by:  As directed    Call MD for:  difficulty breathing, headache or visual disturbances    Complete by:  As directed    Call MD for:  persistant dizziness or light-headedness    Complete by:  As directed    Call MD for:  persistant nausea and vomiting    Complete by:  As directed    Call MD for:  redness, tenderness, or signs of infection (pain, swelling, redness, odor or green/yellow discharge around incision site)    Complete by:  As directed    Call MD for:  severe uncontrolled pain    Complete by:  As directed    Call MD for:  temperature >101 F    Complete by:  As directed    Diet bariatric full liquid    Complete by:  As directed    Discharge instructions    Complete by:  As directed    See bariatric discharge instructions   Incentive spirometry    Complete by:  As directed    Perform hourly while awake     Allergies as of 03/23/2017      Reactions   Pravachol [pravastatin Sodium] Shortness Of Breath, Swelling   Throat  swelling   Codeine    REACTION: increased BP   Ketoconazole    REACTION: hives, swelling   Statins    REACTION: leg crapms      Medication List    STOP taking these medications   ferrous gluconate 325 MG tablet Commonly known as:  FERGON   furosemide 20 MG tablet Commonly known as:  LASIX   LINZESS 145 MCG Caps capsule Generic drug:  linaclotide   metFORMIN 500 MG tablet Commonly known as:  GLUCOPHAGE   potassium chloride 10 MEQ tablet Commonly known as:  K-DUR   traMADol 50 MG tablet Commonly known as:  ULTRAM   valsartan-hydrochlorothiazide 160-25 MG  tablet Commonly known as:  DIOVAN-HCT     TAKE these medications   diclofenac sodium 1 % Gel Commonly known as:  VOLTAREN Apply 2 g topically 2 (two) times daily as needed (JOINT PAIN). Use as directed for knee pain   ezetimibe 10 MG tablet Commonly known as:  ZETIA Take 1 tablet (10 mg total) by mouth daily. What changed:  when to take this   fenofibrate micronized 134 MG capsule Commonly known as:  LOFIBRA TAKE (1) CAPSULE DAILY BEFORE BREAKFAST.   fluticasone 50 MCG/ACT nasal spray Commonly known as:  FLONASE SPRAY 1 SPRAY IN EACH NOSTRIL TWICE DAILY. SHAKE GENTLY BEFORE EACH [place] E.   gabapentin 300 MG capsule Commonly known as:  NEURONTIN Take 2 capsules (600 mg total) by mouth 2 (two) times daily.   glucose blood test strip Commonly known as:  ONE TOUCH ULTRA TEST CHECK BLOOD SUGAR TWICE A DAY AS DIRECTED   HEALTHY COLON PO Take 2 capsules by mouth daily.   levothyroxine 150 MCG tablet Commonly known as:  SYNTHROID, LEVOTHROID TAKE 1 TABLET DAILY   loratadine 10 MG tablet Commonly known as:  CLARITIN Take 10 mg by mouth daily as needed for allergies. Prn   multivitamin capsule Take 1 capsule by mouth daily.   nystatin powder Commonly known as:  MYCOSTATIN/NYSTOP Apply topically 2 (two) times daily as needed. What changed:  reasons to take this   omeprazole 40 MG capsule Commonly known as:  PRILOSEC TAKE (1) CAPSULE DAILY   oxyCODONE 5 MG/5ML solution Commonly known as:  ROXICODONE Take 5-10 mLs (5-10 mg total) by mouth every 4 (four) hours as needed for moderate pain or severe pain.   OXYGEN Inhale 2 L into the lungs Nightly.   PAZEO 0.7 % Soln Generic drug:  Olopatadine HCl Place 1 drop into both eyes every morning.   propranolol 40 MG tablet Commonly known as:  INDERAL Take 1 tablet (40 mg total) by mouth 2 (two) times daily. Notes to patient:  Monitor Blood Pressure Daily and keep a log for primary care physician.  You may need to make changes  to your medications with rapid weight loss.     RESTASIS MULTIDOSE 0.05 % ophthalmic emulsion Generic drug:  cycloSPORINE Place 1 drop into both eyes 2 (two) times daily.   sertraline 100 MG tablet Commonly known as:  ZOLOFT TAKE (2) TABLETS DAILY   SUMAtriptan 6 MG/0.5ML Soaj Inject 0.5 mLs in skin every 2 hours as needed for migraine or headache.   tiZANidine 2 MG tablet Commonly known as:  ZANAFLEX Take 1 tablet (2 mg total) by mouth 3 (three) times daily. What changed:  when to take this  reasons to take this   Vitamin D 2000 units Caps Take 2,000 Units by mouth daily.   ziprasidone 60 MG capsule  Commonly known as:  GEODON TAKE 1 CAPSULE IN THE EVENING      Follow-up Information    Gayland Curry, MD. Go on 04/13/2017.   Specialty:  General Surgery Why:  at 345 pm Contact information: [address] [place] [address] [place], [place] Follow up.   Specialty:  General Surgery Contact information: [place] [address] [phone]            The results of significant diagnostics from this hospitalization (including imaging, microbiology, ancillary and laboratory) are listed below for reference.    Significant Diagnostic Studies: Dg Chest 2 View  Result Date: 03/17/2017 CLINICAL DATA:  Hypertension.  Morbid obesity. EXAM: CHEST  2 VIEW COMPARISON:  06/28/2011. FINDINGS: Mediastinum and hilar structures are normal. Right mid lung field pleuroparenchymal thickening consistent scarring. Low lung volumes. No pleural effusion or pneumothorax. Heart size normal. Degenerative changes thoracic spine . IMPRESSION: 1. Right mid lung field pleuroparenchymal thickening consistent scarring. Low lung volumes. 2.  No acute cardiopulmonary disease. Electronically Signed   By: Marcello Moores  Register   On: 03/17/2017 13:50   Dg Duanne Limerick W/high Density W/kub  Result Date: 03/17/2017 CLINICAL DATA:  Morbid obesity. EXAM: UPPER  GI SERIES WITHOUT KUB TECHNIQUE: Routine upper GI series was performed with thin barium. FLUOROSCOPY TIME:  Fluoroscopy Time:  1 minutes 42 seconds Radiation Exposure Index (if provided by the fluoroscopic device): 44.1 mGy COMPARISON:  None. FINDINGS: The mucosa and motility of the esophagus are normal. No hiatal hernia or gastroesophageal reflux during this exam. The fundus, body, and antrum of the stomach are normal. The pylorus and duodenal bulb and duodenal C-loop are normal. Prior cholecystectomy. IMPRESSION: Normal upper GI. Electronically Signed   By: Lorriane Shire M.D.   On: 03/17/2017 14:17    Labs: Basic Metabolic Panel:  Recent Labs Lab 03/22/17 0501 03/23/17 0505  NA 139 138  K 3.2* 3.3*  CL 100* 101  CO2 33* 29  GLUCOSE 130* 150*  BUN 8 <5*  CREATININE 0.79 0.69  CALCIUM 8.6* 8.4*   Liver Function Tests:  Recent Labs Lab 03/21/17 0602 03/22/17 0501 03/23/17 0505  AST 27 142* 131*  ALT 22 113* 148*  ALKPHOS 55 48 50  BILITOT 0.5 0.4 0.7  PROT 7.6 6.4* 6.7  ALBUMIN 4.3 3.5 3.5    CBC:  Recent Labs Lab 03/21/17 1053 03/22/17 0501 03/22/17 1610 03/23/17 0505  WBC  --  6.9  --  5.7  NEUTROABS  --  5.3  --  4.2  HGB 12.2 11.4* 11.6* 10.9*  HCT 37.5 36.4 36.7 34.9*  MCV  --  86.1  --  84.3  PLT  --  242  --  230    CBG:  Recent Labs Lab 03/22/17 2002 03/22/17 2355 03/23/17 0406 03/23/17 0750 03/23/17 1146  GLUCAP 130* 111* 136* 148* 127*    Principal Problem:   Morbid obesity ([place]) Active Problems:   Obstructive sleep apnea   Essential hypertension   Bilateral chronic knee pain   DM (diabetes mellitus) ([place])   HLD (hyperlipidemia)   Bipolar disorder ([place])   Metabolic syndrome   Fatty liver disease, nonalcoholic   GERD (gastroesophageal reflux disease)   S/P gastric bypass   Time coordinating discharge: 15 min  Signed:  Gayland Curry, MD [facility], [place] [phone] 03/27/2017, 10:39 AM

## 2017-03-29 DIAGNOSIS — G4733 Obstructive sleep apnea (adult) (pediatric): Secondary | ICD-10-CM | POA: Diagnosis not present

## 2017-03-29 DIAGNOSIS — R0902 Hypoxemia: Secondary | ICD-10-CM | POA: Diagnosis not present

## 2017-03-30 ENCOUNTER — Ambulatory Visit (INDEPENDENT_AMBULATORY_CARE_PROVIDER_SITE_OTHER): Payer: Medicare Other | Admitting: Family Medicine

## 2017-03-30 ENCOUNTER — Telehealth (HOSPITAL_COMMUNITY): Payer: Self-pay

## 2017-03-30 ENCOUNTER — Encounter: Payer: Self-pay | Admitting: Family Medicine

## 2017-03-30 VITALS — BP 110/65 | HR 54 | Temp 97.9°F | Ht 64.0 in | Wt 257.4 lb

## 2017-03-30 DIAGNOSIS — E119 Type 2 diabetes mellitus without complications: Secondary | ICD-10-CM | POA: Diagnosis not present

## 2017-03-30 DIAGNOSIS — I10 Essential (primary) hypertension: Secondary | ICD-10-CM

## 2017-03-30 DIAGNOSIS — Z9884 Bariatric surgery status: Secondary | ICD-10-CM

## 2017-03-30 NOTE — Progress Notes (Signed)
   HPI  Patient presents today here for follow up chronic medical problems after Gastric bypas  t doing well post op, on all liquid diet currently with very small stools. C/o gas, trying miralax per surgery's reccs.   Taking vitamins  Has stopped HTn and DM2 meds, BPs ranging 130s avg with a single reading in 552C, 80E diastolic  CBGs- 23-361  Wounds healing well with only minor irritation around one of them.   PMH: Smoking status noted ROS: Per HPI  Objective: BP 110/65   Pulse (!) 54   Temp 97.9 F (36.6 C) (Oral)   Ht 5\' 4"  (1.626 m)   Wt 257 lb 6.4 oz (116.8 kg)   LMP 03/17/2017   BMI 44.18 kg/m  Gen: NAD, alert, cooperative with exam HEENT: NCAT CV: RRR, good S1/S2, no murmur Resp: CTABL, no wheezes, non-labored Abd: healing surgical wounds with steri stroips in place, 4 sites, minimal erythemetous irritation around steri strip on L sided midline wound Neuro: Alert and oriented, No gross deficits  Assessment and plan:  # S/p gastric bypass Doing well post op, tlerating PO and has small BM's.  Working through gas pains, has Gen Surg follow up scheduled.   # HTN BP reasonably controlled without meds, No need to restart currently  # Dm2 CBGs well controlled currently without meds Expect with weight loss likely will remain diet controlled.     Laroy Apple, MD [facility] 03/30/2017, 10:26 AM   '

## 2017-03-30 NOTE — Telephone Encounter (Signed)
Made discharge phone call to patient.. Asking the following questions.    1. Do you have someone to care for you now that you are home?  independent 2. Are you having pain now that is not relieved by your pain medication?  No pain medicine 3. Are you able to drink the recommended daily amount of fluids (48 ounces minimum/day) and protein (60-80 grams/day) as prescribed by the dietitian or nutritional counselor?  64 ounces of fluid and 60 grams of protein 4. Are you taking the vitamins and minerals as prescribed?  Taking vitamins and calcium 5. Do you have the "on call" number to contact your surgeon if you have a problem or question?  yes 6. Are your incisions free of redness, swelling or drainage? (If steri strips, address that these can fall off, shower as tolerated) incisions look good 7. Have your bowels moved since your surgery?  If not, are you passing gas?  Passing gas but no bowel movement started taking miralax 8. Are you up and walking 3-4 times per day?  Walking within home pollen too much to get outside 9. Were you provided your discharge medications before your surgery or before you were discharged from the hospital and are you taking them without problem?  yes

## 2017-04-03 ENCOUNTER — Other Ambulatory Visit: Payer: Self-pay | Admitting: Neurology

## 2017-04-03 ENCOUNTER — Other Ambulatory Visit: Payer: Self-pay | Admitting: Family Medicine

## 2017-04-04 ENCOUNTER — Encounter: Payer: Medicare Other | Attending: General Surgery | Admitting: Skilled Nursing Facility1

## 2017-04-04 DIAGNOSIS — I10 Essential (primary) hypertension: Secondary | ICD-10-CM | POA: Diagnosis not present

## 2017-04-04 DIAGNOSIS — Z9884 Bariatric surgery status: Secondary | ICD-10-CM | POA: Insufficient documentation

## 2017-04-04 DIAGNOSIS — K219 Gastro-esophageal reflux disease without esophagitis: Secondary | ICD-10-CM | POA: Insufficient documentation

## 2017-04-04 DIAGNOSIS — F319 Bipolar disorder, unspecified: Secondary | ICD-10-CM | POA: Insufficient documentation

## 2017-04-04 DIAGNOSIS — Z713 Dietary counseling and surveillance: Secondary | ICD-10-CM | POA: Diagnosis not present

## 2017-04-04 DIAGNOSIS — E785 Hyperlipidemia, unspecified: Secondary | ICD-10-CM | POA: Insufficient documentation

## 2017-04-04 DIAGNOSIS — Z6841 Body Mass Index (BMI) 40.0 and over, adult: Secondary | ICD-10-CM | POA: Diagnosis not present

## 2017-04-04 DIAGNOSIS — E119 Type 2 diabetes mellitus without complications: Secondary | ICD-10-CM | POA: Insufficient documentation

## 2017-04-04 DIAGNOSIS — D119 Benign neoplasm of major salivary gland, unspecified: Secondary | ICD-10-CM | POA: Diagnosis not present

## 2017-04-05 ENCOUNTER — Encounter: Payer: Self-pay | Admitting: Skilled Nursing Facility1

## 2017-04-05 NOTE — Progress Notes (Signed)
Bariatric Class:  Appt start time: 1530 end time:  1630.  2 Week Post-Operative Nutrition Class  Patient was seen on 04/04/2017 for Post[facility]ative [facility]  Pt states she had a dermal allergic reaction to the adhesive during surgery which is getting better. Pt states miralax is helping with constipation.  A1C 6.0, numbers 90-106  Surgery date: 03/21/2017 Surgery type: RYGB Start weight at [facility]: 312.5 Weight today: 248.8  TANITA  BODY COMP RESULTS  04/04/2017   BMI (kg/m^2) 42.7   Fat Mass (lbs) 118.2   Fat Free Mass (lbs) 130.6   Total Body Water (lbs) 95.6   The following the learning objectives were met by the patient during this course:  Identifies Phase 3A (Soft, High Proteins) Dietary Goals and will begin from 2 weeks post-operatively to 2 months post-operatively  Identifies appropriate sources of fluids and proteins   States protein recommendations and appropriate sources post-operatively  Identifies the need for appropriate texture modifications, mastication, and bite sizes when consuming solids  Identifies appropriate multivitamin and calcium sources post-operatively  Describes the need for physical activity post-operatively and will follow MD recommendations  States when to call healthcare provider regarding medication questions or post-operative complications  Handouts given during class include:  Phase 3A: Soft, High Protein Diet Handout  Follow-Up Plan: Patient will follow-up at [facility] in 6 weeks for 2 month post-op nutrition visit for diet advancement per MD.

## 2017-04-14 ENCOUNTER — Ambulatory Visit (INDEPENDENT_AMBULATORY_CARE_PROVIDER_SITE_OTHER): Payer: Medicare Other | Admitting: *Deleted

## 2017-04-14 DIAGNOSIS — E538 Deficiency of other specified B group vitamins: Secondary | ICD-10-CM | POA: Diagnosis not present

## 2017-04-14 NOTE — Progress Notes (Signed)
Pt given Vit B12 inj Tolerated well 

## 2017-04-18 DIAGNOSIS — H04123 Dry eye syndrome of bilateral lacrimal glands: Secondary | ICD-10-CM | POA: Diagnosis not present

## 2017-04-18 DIAGNOSIS — H40033 Anatomical narrow angle, bilateral: Secondary | ICD-10-CM | POA: Diagnosis not present

## 2017-04-18 LAB — HM DIABETES EYE EXAM

## 2017-04-24 ENCOUNTER — Other Ambulatory Visit: Payer: Self-pay

## 2017-04-24 MED ORDER — VALSARTAN-HYDROCHLOROTHIAZIDE 160-25 MG PO TABS: 1.0000 | ORAL_TABLET | Freq: Every day | ORAL | 0 refills | Status: DC

## 2017-04-24 MED ORDER — METFORMIN HCL 500 MG PO TABS: ORAL_TABLET | ORAL | 0 refills | Status: DC

## 2017-04-28 DIAGNOSIS — G4733 Obstructive sleep apnea (adult) (pediatric): Secondary | ICD-10-CM | POA: Diagnosis not present

## 2017-04-28 DIAGNOSIS — R0902 Hypoxemia: Secondary | ICD-10-CM | POA: Diagnosis not present

## 2017-05-10 ENCOUNTER — Encounter: Payer: Self-pay | Admitting: Family Medicine

## 2017-05-10 ENCOUNTER — Ambulatory Visit (INDEPENDENT_AMBULATORY_CARE_PROVIDER_SITE_OTHER): Payer: Medicare Other | Admitting: Family Medicine

## 2017-05-10 VITALS — BP 108/78 | HR 59 | Temp 97.8°F | Ht 64.0 in | Wt 240.0 lb

## 2017-05-10 DIAGNOSIS — L02219 Cutaneous abscess of trunk, unspecified: Secondary | ICD-10-CM | POA: Diagnosis not present

## 2017-05-10 DIAGNOSIS — L03319 Cellulitis of trunk, unspecified: Secondary | ICD-10-CM

## 2017-05-10 MED ORDER — SULFAMETHOXAZOLE-TRIMETHOPRIM 800-160 MG PO TABS: 1.0000 | ORAL_TABLET | Freq: Two times a day (BID) | ORAL | 0 refills | Status: DC

## 2017-05-10 NOTE — Progress Notes (Signed)
BP 108/78   Pulse (!) 59   Temp 97.8 F (36.6 C) (Oral)   Ht 5\' 4"  (1.626 m)   Wt 240 lb (108.9 kg)   BMI 41.20 kg/m    Subjective:    Patient ID: Morgan [name], female    DOB: 06/19/1966, 51 y.o.   MRN: [id number]  HPI: Morgan [name] is a 51 y.o. female presenting on 05/10/2017 for Boil on back (was recently able pop it, had an odor, wants to see if she needs antibiotic)   HPI Abs//skin infection Patient comes in today for a boil/abscess that is started on her back. She says it's been there about a week and has been increasing in size until yesterday when she finally did get it to drain. Once she got up to drain it did feel better but she is still concerned because it is not draining anymore and they're still a hard knot and it is tender to palpation. She is a diabetic and has high risk factor for skin infections but her blood sugars have been running in the 90s over the past few days despite the infection. She denies any fevers or chills. The spot is on her right upper back near her spine. She denies any pain radiating anywhere else or numbness or weakness.  Relevant past medical, surgical, family and social history reviewed and updated as indicated. Interim medical history since our last visit reviewed. Allergies and medications reviewed and updated.  Review of Systems  Constitutional: Negative for chills and fever.  Respiratory: Negative for chest tightness and shortness of breath.   Cardiovascular: Negative for chest pain and leg swelling.  Musculoskeletal: Negative for back pain and gait problem.  Skin: Positive for color change. Negative for rash.  Neurological: Negative for light-headedness and headaches.  Psychiatric/Behavioral: Negative for agitation and behavioral problems.  All other systems reviewed and are negative.   Per HPI unless specifically indicated above        Objective:    BP 108/78   Pulse (!) 59   Temp 97.8 F (36.6 C) (Oral)   Ht 5\' 4"   (1.626 m)   Wt 240 lb (108.9 kg)   BMI 41.20 kg/m   Wt Readings from Last 3 Encounters:  05/10/17 240 lb (108.9 kg)  04/04/17 248 lb 12.8 oz (112.9 kg)  03/30/17 257 lb 6.4 oz (116.8 kg)    Physical Exam  Constitutional: She is oriented to person, place, and time. She appears well-developed and well-nourished. No distress.  Eyes: Conjunctivae are normal.  Musculoskeletal: Normal range of motion.  Neurological: She is alert and oriented to person, place, and time. Coordination normal.  Skin: Skin is warm and dry. No rash noted. She is not diaphoretic. There is erythema (3 cm area of erythema with 1.5 cm area of induration).  Psychiatric: She has a normal mood and affect. Her behavior is normal.  Nursing note and vitals reviewed.   I&D: 15 blade was used. Incision was made on anterior medial aspect of the open wound. Significant serosanguineous and purulent drainage was exuded. Culture was taken. Simple bandage was placed. Bleeding was minimal and patient tolerated procedure well. Able to express about 5 mL of purulent exudate     Assessment & Plan:   Problem List Items Addressed This Visit    None    Visit Diagnoses    Cellulitis and abscess of trunk    -  Primary   Right upper back, did I&D and send Bactrim  Relevant Medications   sulfamethoxazole-trimethoprim (BACTRIM DS,SEPTRA DS) 800-160 MG tablet   Other Relevant Orders   Anaerobic and Aerobic Culture       Follow up plan: Return if symptoms worsen or fail to improve.  Counseling provided for all of the vaccine components No orders of the defined types were placed in this encounter.   Caryl Pina, MD [facility] 05/10/2017, 9:00 AM

## 2017-05-12 ENCOUNTER — Other Ambulatory Visit: Payer: Self-pay | Admitting: Family Medicine

## 2017-05-14 LAB — ANAEROBIC AND AEROBIC CULTURE

## 2017-05-15 ENCOUNTER — Other Ambulatory Visit: Payer: Self-pay | Admitting: Family Medicine

## 2017-05-16 ENCOUNTER — Encounter: Payer: Self-pay | Admitting: Skilled Nursing Facility1

## 2017-05-16 ENCOUNTER — Ambulatory Visit: Payer: Medicare Other

## 2017-05-16 ENCOUNTER — Encounter: Payer: Medicare Other | Attending: General Surgery | Admitting: Skilled Nursing Facility1

## 2017-05-16 DIAGNOSIS — E119 Type 2 diabetes mellitus without complications: Secondary | ICD-10-CM | POA: Diagnosis not present

## 2017-05-16 DIAGNOSIS — I10 Essential (primary) hypertension: Secondary | ICD-10-CM | POA: Insufficient documentation

## 2017-05-16 DIAGNOSIS — E785 Hyperlipidemia, unspecified: Secondary | ICD-10-CM | POA: Diagnosis not present

## 2017-05-16 DIAGNOSIS — F319 Bipolar disorder, unspecified: Secondary | ICD-10-CM | POA: Insufficient documentation

## 2017-05-16 DIAGNOSIS — Z6841 Body Mass Index (BMI) 40.0 and over, adult: Secondary | ICD-10-CM | POA: Diagnosis not present

## 2017-05-16 DIAGNOSIS — Z713 Dietary counseling and surveillance: Secondary | ICD-10-CM | POA: Diagnosis not present

## 2017-05-16 DIAGNOSIS — K219 Gastro-esophageal reflux disease without esophagitis: Secondary | ICD-10-CM | POA: Diagnosis not present

## 2017-05-16 DIAGNOSIS — D119 Benign neoplasm of major salivary gland, unspecified: Secondary | ICD-10-CM | POA: Diagnosis not present

## 2017-05-16 NOTE — Patient Instructions (Addendum)
-  Call your surgeon for something for the constipation   -Try to aim for 70-80 fluid ounces   -Start with cooked/soft vegetables   -Add vinegar or mustard to your pork   -Start out with one vegetable at a time   -Eat all of the protein first then start in on the vegetables   -If it is too much of a struggle to add enough fluid and vegetables focus on protein and fluid

## 2017-05-16 NOTE — Progress Notes (Signed)
Pt states she had a blood sugar reading of 65 one time asymptomatic. Pt states the linzess causing cramping and nausea so stopped taking it. Pt states she needs to take an ex-lax to have a movement. Pt arrives with a dry mouth.   Surgery date: 03/21/2017 Surgery type: RYGB Start weight at [facility]: 312.5 Weight today: 233.2 Weight Change: 15  TANITA  BODY COMP RESULTS  04/04/2017 05/16/2017   BMI (kg/m^2) 42.7 40.0   Fat Mass (lbs) 118.2 110.4   Fat Free Mass (lbs) 130.6 122.8   Total Body Water (lbs) 95.6 89.4   24-hr recall: B (AM): 2 boiled eggs (14g) Snk (AM):  L (PM): 4 ounces of tuna (28g)---chicken and beans Snk (PM): sometimes sugar free popcicle  D (PM): chicken and beans (28g) Snk (PM):   Fluid intake: water, unsweet tea: 66 fluid ounces  Estimated total protein intake: 70  Medications: see list  Supplementation: bariatric advantage and calcium   CBG monitoring: 3 times a day Average CBG per patient: under 100 Last patient reported A1c: 6.0  Using straws: no Drinking while eating: no Having you been chewing well: yes Chewing/swallowing difficulties: no Changes in vision: no Changes to mood/headaches: no Hair loss/Cahnges to skin/Changes to nails: no Any difficulty focusing or concentrating: no Sweating: no Dizziness/Lightheaded: no Palpitations: no  Carbonated beverages: no N/V/D/C/GAS:  Constipation  Abdominal Pain: no Dumping syndrome: no  Recent physical activity:  Water aerobics 3 days week   Progress Towards Goal(s):  In progress.  Handouts given during visit include:  NS veggies + protein   Nutritional Diagnosis:  [place]-3.3 Overweight/obesity related to past poor dietary habits and physical inactivity as evidenced by patient w/ recent RYGB surgery following dietary guidelines for continued weight loss.    Intervention:  Nutrition counseling. Dietitian educated the pt on advancing her diet to include ns veggies while having enough room to add more  fluid into her day. Goals: -Call your surgeon for something for the constipation  -Try to aim for 70-80 fluid ounces  -Start with cooked/soft vegetables  -Add vinegar or mustard to your pork  -Start out with one vegetable at a time  -Eat all of the protein first then start in on the vegetables  -If it is too much of a struggle to add enough fluid and vegetables focus on protein and fluid  Teaching Method Utilized:  Visual Auditory Hands on  Barriers to learning/adherence to lifestyle change: lack of internal cues for hydration/blood sugars  Demonstrated degree of understanding via:  Teach Back   Monitoring/Evaluation:  Dietary intake, exercise, lap band fills, and body weight.

## 2017-05-18 ENCOUNTER — Ambulatory Visit (INDEPENDENT_AMBULATORY_CARE_PROVIDER_SITE_OTHER): Payer: Medicare Other | Admitting: *Deleted

## 2017-05-18 DIAGNOSIS — E538 Deficiency of other specified B group vitamins: Secondary | ICD-10-CM | POA: Diagnosis not present

## 2017-05-18 NOTE — Progress Notes (Signed)
Vit B12 inj given Pt tolerated well 

## 2017-05-23 ENCOUNTER — Other Ambulatory Visit: Payer: Self-pay

## 2017-05-23 ENCOUNTER — Telehealth: Payer: Self-pay

## 2017-05-23 NOTE — Telephone Encounter (Signed)
Pt with recent weight loss after bariatric surgery. She was doing well without meds at our visit in 03/2017. I am ok with her not taking meds and restarting cautiously as needed.   Laroy Apple, MD [facility] 05/23/2017, 10:17 AM

## 2017-05-23 NOTE — Telephone Encounter (Signed)
Morgan [name] from [facility] called and wanted to make sure that patient was supposed to discontinue taking her metformin and Valsartin- HCTZ . States she has not taken them in 47 days. I do not see anything in the office note. Please review and advise. Wilhemena Durie would like a call back at [phone].

## 2017-05-23 NOTE — Patient Outreach (Signed)
[facility]) Care Management  05/23/2017  Morgan [name] 1966/06/25 [id number]   Medication Adherence call to Morgan [name]  Mrs. Szwed is showing under [facility] she is past due on her valsartan /hctz 160/25 and her metformin 500 mg she said she had surgery and that the doctor took her off of both medications for now until she sees the doctor in July.     [facility] Direct Dial [phone]  Fax [phone] [email or web address]

## 2017-05-23 NOTE — Telephone Encounter (Signed)
lmtcb

## 2017-05-23 NOTE — Telephone Encounter (Signed)
Aware and verbalizes understanding.  

## 2017-05-29 DIAGNOSIS — G4733 Obstructive sleep apnea (adult) (pediatric): Secondary | ICD-10-CM | POA: Diagnosis not present

## 2017-05-29 DIAGNOSIS — R0902 Hypoxemia: Secondary | ICD-10-CM | POA: Diagnosis not present

## 2017-06-06 ENCOUNTER — Encounter: Payer: Self-pay | Admitting: Adult Health

## 2017-06-06 ENCOUNTER — Ambulatory Visit (INDEPENDENT_AMBULATORY_CARE_PROVIDER_SITE_OTHER): Payer: Medicare Other | Admitting: Adult Health

## 2017-06-06 ENCOUNTER — Other Ambulatory Visit: Payer: Self-pay | Admitting: Family Medicine

## 2017-06-06 VITALS — BP 129/72 | HR 50 | Ht 64.0 in | Wt 225.4 lb

## 2017-06-06 DIAGNOSIS — G43019 Migraine without aura, intractable, without status migrainosus: Secondary | ICD-10-CM | POA: Diagnosis not present

## 2017-06-06 DIAGNOSIS — E785 Hyperlipidemia, unspecified: Secondary | ICD-10-CM

## 2017-06-06 MED ORDER — GABAPENTIN 800 MG PO TABS: 800.0000 mg | ORAL_TABLET | Freq: Two times a day (BID) | ORAL | 5 refills | Status: DC

## 2017-06-06 NOTE — Patient Instructions (Signed)
Increase Gabapentin 800 mg twice a day If your symptoms worsen or you develop new symptoms please let us know.

## 2017-06-06 NOTE — Progress Notes (Signed)
PATIENT: Morgan [name] DOB: 1966-03-12  REASON FOR VISIT: follow up- migraine  HISTORY FROM: patient  HISTORY OF PRESENT ILLNESS: Today 06/06/2017: Ms. Bessent is a 51 year old female with a history of intractable migraine headaches. She returns today for follow-up. At the last visit gabapentin was Increased to 600 mg twice a day. She reports that this has helped with the severity of her headaches. She states that she is no longer having headaches back to back and even though some weeks without a headache however she still reports that she has 12 headaches a month. Her headaches are typically located in the temporal regions radiating to the occipital region. She does have photophobia, phonophobia as well as nausea but denies vomiting. She states with severe headache she will have blurry vision otherwise she does not have any visual changes. She has tried Botox in the past with good beneift.  However she cannot continue to afford injections. She returns today for an evaluation.  HISTORY 03/02/17:  Mrs. Deprey is a 51 year old female with a history of intractable migraine headaches. She returns today for follow-up. She states that she confuses Dr. Tobey Grim instructions regarding gabapentin. Instead of taking gabapentin 600 twice a day she was only taking 600 daily. She's been having 15-17 headaches a month. Her headaches typically start in the occipital region and spread throughout the head. She does have photophobia and phonophobia but denies nausea and vomiting. She states that she does see spots in the right periphery. She states in the past she did receive Botox and received some benefit however she can no longer afford this. She states that she has been under increased stress last several months due to her sister's death. She returns today for evaluation.  HISTORY 09/14/16: Ms. Cope is a 51 year old right-handed white female with a history of morbid obesity and intractable migraine  headaches. The patient has been placed on gabapentin and she is on propranolol for headaches. She has had some improvement in her headache frequency and severity since that time. She is currently having 8-12 headaches a month, 3 or 4 these headaches are incapacitating. The patient may go up to a week without any headaches. The patient is now on injectable Imitrex which seems to be more effective than the oral Maxalt. She in the past has been on Botox treatments, she has had difficulty affording the therapy and had to stop the medication. The Botox was effective. The patient is considering bariatric surgery for her obesity. She returns to the office today for an evaluation.  REVIEW OF SYSTEMS: Out of a complete 14 system review of symptoms, the patient complains only of the following symptoms, and all other reviewed systems are negative.  Light sensitivity, constipation, apnea, joint pain, depression, dizziness, headache, anemia  ALLERGIES: Allergies  Allergen Reactions  . Pravachol [Pravastatin Sodium] Shortness Of Breath and Swelling    Throat swelling  . Codeine     REACTION: increased BP  . Ketoconazole     REACTION: hives, swelling  . Statins     REACTION: leg crapms    HOME MEDICATIONS: Outpatient Medications Prior to Visit  Medication Sig Dispense Refill  . Cholecalciferol (VITAMIN D) 2000 UNITS CAPS Take 2,000 Units by mouth daily.    . diclofenac sodium (VOLTAREN) 1 % GEL Apply 2 g topically 2 (two) times daily as needed (JOINT PAIN). Use as directed for knee pain    . ezetimibe (ZETIA) 10 MG tablet Take 1 tablet (10 mg total) by mouth daily. (  Patient taking differently: Take 10 mg by mouth every evening. ) 30 tablet 5  . fenofibrate micronized (LOFIBRA) 134 MG capsule TAKE (1) CAPSULE DAILY BEFORE BREAKFAST. 30 capsule 3  . fluticasone (FLONASE) 50 MCG/ACT nasal spray SPRAY 1 SPRAY IN EACH NOSTRIL TWICE DAILY. SHAKE GENTLY BEFORE EACH [place] E. 16 g 1  . gabapentin (NEURONTIN) 300 MG  capsule Take 2 capsules (600 mg total) by mouth 2 (two) times daily.    Marland Kitchen glucose blood (ONE TOUCH ULTRA TEST) test strip CHECK BLOOD SUGAR TWICE A DAY AS DIRECTED 100 each 3  . levothyroxine (SYNTHROID, LEVOTHROID) 150 MCG tablet TAKE 1 TABLET DAILY 30 tablet 3  . loratadine (CLARITIN) 10 MG tablet Take 10 mg by mouth daily as needed for allergies. Prn      . metFORMIN (GLUCOPHAGE) 500 MG tablet Once daily in the evening 90 tablet 0  . Multiple Vitamin (MULTIVITAMIN) capsule Take 1 capsule by mouth daily.      Marland Kitchen NYAMYC powder APPLY TOPICALLY TWICE A DAY AS NEEDED 30 g 0  . omeprazole (PRILOSEC) 40 MG capsule TAKE (1) CAPSULE DAILY 30 capsule 3  . OXYGEN Inhale 2 L into the lungs Nightly.    Marland Kitchen PAZEO 0.7 % SOLN Place 1 drop into both eyes every morning.    . Probiotic Product (HEALTHY COLON PO) Take 2 capsules by mouth daily.     . propranolol (INDERAL) 40 MG tablet TAKE  (1)  TABLET TWICE A DAY. 180 tablet 0  . RESTASIS MULTIDOSE 0.05 % ophthalmic emulsion Place 1 drop into both eyes 2 (two) times daily.    . sertraline (ZOLOFT) 100 MG tablet TAKE (2) TABLETS DAILY 60 tablet 2  . SUMAtriptan 6 MG/0.5ML SOAJ Inject 0.5 mLs in skin every 2 hours as needed for migraine or headache. 4 mL 5  . tiZANidine (ZANAFLEX) 2 MG tablet Take 1 tablet (2 mg total) by mouth 3 (three) times daily. (Patient taking differently: Take 2 mg by mouth every 8 (eight) hours as needed for muscle spasms. ) 90 tablet 5  . ziprasidone (GEODON) 60 MG capsule TAKE 1 CAPSULE IN THE EVENING 30 capsule 2  . sulfamethoxazole-trimethoprim (BACTRIM DS,SEPTRA DS) 800-160 MG tablet Take 1 tablet by mouth 2 (two) times daily. (Patient not taking: Reported on 06/06/2017) 20 tablet 0   Facility-Administered Medications Prior to Visit  Medication Dose Route Frequency Provider Last Rate Last Dose  . cyanocobalamin ((VITAMIN B-12)) injection 1,000 mcg  1,000 mcg Intramuscular Q30 days Wardell Honour, MD   1,000 mcg at 05/18/17 0815     PAST MEDICAL HISTORY: Past Medical History:  Diagnosis Date  . Allergic rhinitis   . Barrett's esophagus   . Bipolar affective disorder ([place])   . Chronic respiratory failure ([place])   . Constipation, chronic   . Diabetes mellitus without complication ([place])    type 2   . Dry eye   . Gastroparesis   . GERD (gastroesophageal reflux disease)   . Hyperlipidemia   . Hypertension   . Intractable chronic migraine without aura 06/04/2015  . Migraine headache   . Morbid obesity ([place])   . OSA (obstructive sleep apnea)    uses a cpap with 2 units o2   . Osteoarthritis    bilateral knee  . Sleep apnea    has c-pap  . Unspecified hypothyroidism   . Vitamin B 12 deficiency   . Vitamin D deficiency     PAST SURGICAL HISTORY: Past Surgical History:  Procedure  Laterality Date  . APPENDECTOMY  1978  . CHOLECYSTECTOMY  2005  . GASTRIC ROUX-EN-Y N/A 03/21/2017   Procedure: LAPAROSCOPIC ROUX-EN-Y GASTRIC, UPPER ENDO;  Surgeon: Greer Pickerel, MD;  Location: [facility];  Service: General;  Laterality: N/A;  . SHOULDER ARTHROSCOPY  7/12   left-dsc  . TONSILLECTOMY  at age 12  . TRIGGER FINGER RELEASE  12/20/2012   Procedure: RELEASE TRIGGER FINGER/A-1 PULLEY;  Surgeon: Tennis Must, MD;  Location: [facility];  Service: Orthopedics;  Laterality: Left;  LEFT TRIGGER THUMB RELEASE    FAMILY HISTORY: Family History  Problem Relation Age of Onset  . Asthma Father   . Allergies Father   . Heart disease Father        enlarged heart  . Peripheral vascular disease Father   . Diabetes Father   . Hyperlipidemia Father   . Arthritis Father   . Asthma Sister   . Cancer Sister        colon at 21 yr old.  . Colon cancer Sister   . Allergies Mother   . Stroke Mother 7       with hemi paralysis  . Diabetes Mother   . Hyperlipidemia Mother   . Hypertension Mother   . GI problems Mother   . Arthritis Mother   . Allergies Brother   . Early death Brother 9       congenital abormality   . Allergies Sister   . Diabetes Sister   . Asthma Sister   . Colon polyps Sister   . Hyperlipidemia Sister   . GI problems Sister        gastroporesis   . Liver disease Sister        fatty liver  . Stroke Sister        intercrandial bleed  . Diabetes Brother   . Hypertension Brother   . Hyperlipidemia Brother   . Diabetes Daughter   . Hyperlipidemia Daughter   . Hypertension Daughter   . Heart disease Paternal Aunt   . Migraines Neg Hx     SOCIAL HISTORY: Social History   Social History  . Marital status: Single    Spouse name: N/A  . Number of children: 0  . Years of education: HS   Occupational History  . disabled Unemployed   Social History Main Topics  . Smoking status: Never Smoker  . Smokeless tobacco: Never Used  . Alcohol use No  . Drug use: No  . Sexual activity: No   Other Topics Concern  . Not on file   Social History Narrative   Patient is right handed.   Patient drinks 2 glasses of caffeine daily.      PHYSICAL EXAM  Vitals:   06/06/17 0919  BP: 129/72  Pulse: (!) 50  Weight: 225 lb 6.4 oz (102.2 kg)  Height: 5\' 4"  (1.626 m)   Body mass index is 38.69 kg/m.  Generalized: Well developed, in no acute distress   Neurological examination  Mentation: Alert oriented to time, place, history taking. Follows all commands speech and language fluent Cranial nerve II-XII: Pupils were equal round reactive to light. Extraocular movements were full, visual field were full on confrontational test. Facial sensation and strength were normal. Uvula tongue midline. Head turning and shoulder shrug  were normal and symmetric. Motor: The motor testing reveals 5 over 5 strength of all 4 extremities. Good symmetric motor tone is noted throughout.  Sensory: Sensory testing is intact to soft touch on  all 4 extremities. No evidence of extinction is noted.  Coordination: Cerebellar testing reveals good finger-nose-finger and heel-to-shin bilaterally.  Gait and  station: Gait is normal.  Reflexes: Deep tendon reflexes are symmetric and normal bilaterally.   DIAGNOSTIC DATA (LABS, IMAGING, TESTING) - I reviewed patient records, labs, notes, testing and imaging myself where available.  Lab Results  Component Value Date   WBC 5.7 03/23/2017   HGB 10.9 (L) 03/23/2017   HCT 34.9 (L) 03/23/2017   MCV 84.3 03/23/2017   PLT 230 03/23/2017      Component Value Date/Time   NA 138 03/23/2017 0505   NA 142 09/29/2016 0839   K 3.3 (L) 03/23/2017 0505   CL 101 03/23/2017 0505   CO2 29 03/23/2017 0505   GLUCOSE 150 (H) 03/23/2017 0505   BUN <5 (L) 03/23/2017 0505   BUN 19 09/29/2016 0839   CREATININE 0.69 03/23/2017 0505   CREATININE 0.82 06/18/2013 1009   CALCIUM 8.4 (L) 03/23/2017 0505   PROT 6.7 03/23/2017 0505   PROT 7.2 09/29/2016 0839   ALBUMIN 3.5 03/23/2017 0505   ALBUMIN 4.3 09/29/2016 0839   AST 131 (H) 03/23/2017 0505   ALT 148 (H) 03/23/2017 0505   ALKPHOS 50 03/23/2017 0505   BILITOT 0.7 03/23/2017 0505   BILITOT 0.2 09/29/2016 0839   GFRNONAA >60 03/23/2017 0505   GFRNONAA 87 06/18/2013 1009   GFRAA >60 03/23/2017 0505   GFRAA >89 06/18/2013 1009    Lab Results  Component Value Date   HGBA1C 6.2 (H) 03/07/2017       ASSESSMENT AND PLAN 51 y.o. year old female  has a past medical history of Allergic rhinitis; Barrett's esophagus; Bipolar affective disorder ([place]); Chronic respiratory failure ([place]); Constipation, chronic; Diabetes mellitus without complication ([place]); Dry eye; Gastroparesis; GERD (gastroesophageal reflux disease); Hyperlipidemia; Hypertension; Intractable chronic migraine without aura (06/04/2015); Migraine headache; Morbid obesity ([place]); OSA (obstructive sleep apnea); Osteoarthritis; Sleep apnea; Unspecified hypothyroidism; Vitamin B 12 deficiency; and Vitamin D deficiency. here with:  1. Migraine headache  Patient has received some benefit with gabapentin. We will increase 800 mg twice a day. It was  questionable whether gabapentin caused an increased in her blood pressure/heart rate. I advised patient that she should monitor this. I have also asked for a Botox needed to see if her insurance will have her Botox injections that have given her the best benefit. She will return in 6 months for follow-up.  I spent 15 minutes with the patient. 50% of this time was spent discussing treatment options      Ward Givens, MSN, NP-C 06/06/2017, 9:28 AM [facility] [address] [place], [place] [address] [phone]

## 2017-06-06 NOTE — Progress Notes (Signed)
I have read the note, and I agree with the clinical assessment and plan.  Luman Holway KEITH   

## 2017-06-07 NOTE — Telephone Encounter (Signed)
Last lipid 09/29/16

## 2017-06-15 ENCOUNTER — Encounter: Payer: Self-pay | Admitting: Skilled Nursing Facility1

## 2017-06-15 ENCOUNTER — Encounter: Payer: Medicare Other | Attending: General Surgery | Admitting: Skilled Nursing Facility1

## 2017-06-15 DIAGNOSIS — Z713 Dietary counseling and surveillance: Secondary | ICD-10-CM | POA: Diagnosis not present

## 2017-06-15 DIAGNOSIS — E119 Type 2 diabetes mellitus without complications: Secondary | ICD-10-CM

## 2017-06-15 DIAGNOSIS — K219 Gastro-esophageal reflux disease without esophagitis: Secondary | ICD-10-CM | POA: Insufficient documentation

## 2017-06-15 DIAGNOSIS — D119 Benign neoplasm of major salivary gland, unspecified: Secondary | ICD-10-CM | POA: Diagnosis not present

## 2017-06-15 DIAGNOSIS — Z9884 Bariatric surgery status: Secondary | ICD-10-CM | POA: Diagnosis not present

## 2017-06-15 DIAGNOSIS — E1169 Type 2 diabetes mellitus with other specified complication: Secondary | ICD-10-CM | POA: Insufficient documentation

## 2017-06-15 DIAGNOSIS — G4733 Obstructive sleep apnea (adult) (pediatric): Secondary | ICD-10-CM | POA: Diagnosis not present

## 2017-06-15 DIAGNOSIS — Z6841 Body Mass Index (BMI) 40.0 and over, adult: Secondary | ICD-10-CM | POA: Insufficient documentation

## 2017-06-15 DIAGNOSIS — I10 Essential (primary) hypertension: Secondary | ICD-10-CM | POA: Diagnosis not present

## 2017-06-15 DIAGNOSIS — E8881 Metabolic syndrome: Secondary | ICD-10-CM | POA: Diagnosis not present

## 2017-06-15 DIAGNOSIS — E785 Hyperlipidemia, unspecified: Secondary | ICD-10-CM | POA: Diagnosis not present

## 2017-06-15 DIAGNOSIS — K5909 Other constipation: Secondary | ICD-10-CM | POA: Diagnosis not present

## 2017-06-15 NOTE — Progress Notes (Signed)
  Pt states she gets nauseated when she is on her period. Pt states she is doing really well and is very happy with her progress so far.   Surgery date: 03/21/2017 Surgery type: RYGB Start weight at [facility]: 312.5 Weight today: 233.2 Weight Change: 12.8  TANITA  BODY COMP RESULTS  04/04/2017 05/16/2017 06/15/2017   BMI (kg/m^2) 42.7 40.0 37.8   Fat Mass (lbs) 118.2 110.4 100.2   Fat Free Mass (lbs) 130.6 122.8 120.2   Total Body Water (lbs) 95.6 89.4 87   24-hr recall: B (AM): 2 boiled eggs with 1 or 2 slices bacon (94I) Snk (AM): gold fish L (PM): 4 ounces of tuna (28g)---chicken and vegetables  Snk (PM): peach D (PM): chicken and vegetables (28g) Snk (PM):   Fluid intake: water, unsweet tea: Aiming for 80 fluid ounces  Estimated total protein intake: 70  Medications: see list  Supplementation: bariatric advantage and calcium   CBG monitoring: 3 times a day Average CBG per patient: under 75-101 Last patient reported A1c: 6.0  Using straws: no Drinking while eating: no Having you been chewing well: yes Chewing/swallowing difficulties: no Changes in vision: no Changes to mood/headaches: no Hair loss/Cahnges to skin/Changes to nails: no Any difficulty focusing or concentrating: no Sweating: no Dizziness/Lightheaded: no Palpitations: no  Carbonated beverages: no N/V/D/C/GAS:  Constipation  Abdominal Pain: no Dumping syndrome: no  Recent physical activity:  Water aerobics 3 days week 60 minutes   Progress Towards Goal(s):  In progress.  Handouts given during visit include:  NS veggies + protein   Nutritional Diagnosis:  [place]-3.3 Overweight/obesity related to past poor dietary habits and physical inactivity as evidenced by patient w/ recent RYGB surgery following dietary guidelines for continued weight loss.    Intervention:  Nutrition counseling. Dietitian educated the pt on advancing her diet to include ns veggies while having enough room to add more fluid into her  day. Goals: -Try kefir or kefir cup in the yogurt aisle  -Try the raw vegetables and chew very well -Wait on the baked potato for now Teaching Method Utilized:  Visual Auditory Hands on  Barriers to learning/adherence to lifestyle change: lack of internal cues for hydration/blood sugars  Demonstrated degree of understanding via:  Teach Back   Monitoring/Evaluation:  Dietary intake, exercise, and body weight.

## 2017-06-15 NOTE — Patient Instructions (Addendum)
-  Try kefir or kefir cup in the yogurt aisle   -Try the raw vegetables and chew very well  -Wait on the baked potato for now

## 2017-06-19 ENCOUNTER — Other Ambulatory Visit: Payer: Self-pay | Admitting: Family Medicine

## 2017-06-20 ENCOUNTER — Telehealth: Payer: Self-pay | Admitting: Adult Health

## 2017-06-20 ENCOUNTER — Telehealth: Payer: Self-pay | Admitting: Neurology

## 2017-06-20 ENCOUNTER — Ambulatory Visit (INDEPENDENT_AMBULATORY_CARE_PROVIDER_SITE_OTHER): Payer: Medicare Other | Admitting: *Deleted

## 2017-06-20 DIAGNOSIS — E538 Deficiency of other specified B group vitamins: Secondary | ICD-10-CM

## 2017-06-20 NOTE — Telephone Encounter (Signed)
Patient states Morgan [name] increased gabapentin (NEURONTIN) 800 MG tablet at last visit. Since taking new dosage her head feels lightheaded and like someone is pushing her head in. Please call and discuss.

## 2017-06-20 NOTE — Progress Notes (Signed)
Pt given Vit B12 inj Tolerated well 

## 2017-06-20 NOTE — Telephone Encounter (Signed)
Spoke with patient and advised her that NP wants he to go back to previous dose of Gabapentin. Advised her that hopefully the Botox will help her headaches. Asked patient if she has heard about Botox; she stated she has not. This RN stated she would send a message to Andee Poles to call her about Botox with update. Patient verbalized understanding, appreciation.

## 2017-06-21 NOTE — Telephone Encounter (Signed)
Last seen 05/10/17  Dr Dettinger   Dr Wendi Snipes PCP

## 2017-06-23 NOTE — Anesthesia Postprocedure Evaluation (Signed)
Anesthesia Post Note  Patient: [name]  Procedure(s) Performed: Procedure(s) (LRB): LAPAROSCOPIC ROUX-EN-Y GASTRIC, UPPER ENDO (N/A)     Anesthesia Post Evaluation  Last Vitals:  Vitals:   03/23/17 0409 03/23/17 1100  BP: 118/64 121/73  Pulse: 88 86  Resp: 18 18  Temp: 36.7 C 36.9 C    Last Pain:  Vitals:   03/23/17 1100  TempSrc: Oral  PainSc:                  Riccardo Dubin

## 2017-06-23 NOTE — Addendum Note (Signed)
Addendum  created 06/23/17 1115 by Lyndle Herrlich, MD   Sign clinical note

## 2017-06-28 DIAGNOSIS — R0902 Hypoxemia: Secondary | ICD-10-CM | POA: Diagnosis not present

## 2017-06-28 DIAGNOSIS — G4733 Obstructive sleep apnea (adult) (pediatric): Secondary | ICD-10-CM | POA: Diagnosis not present

## 2017-06-29 ENCOUNTER — Other Ambulatory Visit: Payer: Self-pay | Admitting: Neurology

## 2017-06-30 NOTE — Telephone Encounter (Signed)
I spoke with the pharmacy today who confirmed the medication was ready and patient has given consent. Scheduled delivery for 07/04/17. Patient has been scheduled for apt.

## 2017-07-03 ENCOUNTER — Other Ambulatory Visit: Payer: Self-pay | Admitting: Family Medicine

## 2017-07-03 DIAGNOSIS — E785 Hyperlipidemia, unspecified: Secondary | ICD-10-CM

## 2017-07-04 ENCOUNTER — Ambulatory Visit: Payer: Self-pay | Admitting: Pharmacist

## 2017-07-04 DIAGNOSIS — Z1231 Encounter for screening mammogram for malignant neoplasm of breast: Secondary | ICD-10-CM | POA: Diagnosis not present

## 2017-07-04 DIAGNOSIS — Z01419 Encounter for gynecological examination (general) (routine) without abnormal findings: Secondary | ICD-10-CM | POA: Diagnosis not present

## 2017-07-04 DIAGNOSIS — N920 Excessive and frequent menstruation with regular cycle: Secondary | ICD-10-CM | POA: Diagnosis not present

## 2017-07-05 NOTE — Telephone Encounter (Signed)
last lipid 09/29/16  Last thyroid 07/05/16

## 2017-07-06 ENCOUNTER — Ambulatory Visit (INDEPENDENT_AMBULATORY_CARE_PROVIDER_SITE_OTHER): Payer: Medicare Other | Admitting: Family Medicine

## 2017-07-06 ENCOUNTER — Encounter: Payer: Self-pay | Admitting: Family Medicine

## 2017-07-06 VITALS — BP 100/64 | HR 64 | Temp 98.4°F | Ht 64.0 in | Wt 208.4 lb

## 2017-07-06 DIAGNOSIS — E119 Type 2 diabetes mellitus without complications: Secondary | ICD-10-CM | POA: Diagnosis not present

## 2017-07-06 DIAGNOSIS — E785 Hyperlipidemia, unspecified: Secondary | ICD-10-CM

## 2017-07-06 DIAGNOSIS — E039 Hypothyroidism, unspecified: Secondary | ICD-10-CM

## 2017-07-06 DIAGNOSIS — E1169 Type 2 diabetes mellitus with other specified complication: Secondary | ICD-10-CM

## 2017-07-06 DIAGNOSIS — Z9884 Bariatric surgery status: Secondary | ICD-10-CM

## 2017-07-06 MED ORDER — DEXLANSOPRAZOLE 60 MG PO CPDR: 60.0000 mg | DELAYED_RELEASE_CAPSULE | Freq: Every day | ORAL | 5 refills | Status: DC

## 2017-07-06 MED ORDER — SERTRALINE HCL 100 MG PO TABS: ORAL_TABLET | ORAL | 3 refills | Status: DC

## 2017-07-06 MED ORDER — ZIPRASIDONE HCL 60 MG PO CAPS: ORAL_CAPSULE | ORAL | 5 refills | Status: DC

## 2017-07-06 MED ORDER — NYSTATIN 100000 UNIT/GM EX POWD: CUTANEOUS | 0 refills | Status: DC

## 2017-07-06 NOTE — Progress Notes (Signed)
   HPI  Patient presents today here for follow-up chronic medical conditions.  Patient has so far lost 100 pounds after gastric bypass.  Patient feels fantastic and has recently stopped taking all of her diabetic medications, her blood sugars throughout the day have ranged from 70-100.  Her blood pressures have ranged 110-150/60-75. Mostly the 120s.  No chest pain, palpitations, leg edema.  Patient stopped tolerating food and fluids well. She is having breakthrough GERD symptoms.  PMH: Smoking status noted ROS: Per HPI  Objective: BP 100/64   Pulse 64   Temp 98.4 F (36.9 C) (Oral)   Ht '5\' 4"'$  (1.626 m)   Wt 208 lb 6.4 oz (94.5 kg)   BMI 35.77 kg/m  Gen: NAD, alert, cooperative with exam HEENT: NCAT CV: RRR, good S1/S2, no murmur Resp: CTABL, no wheezes, non-labored Ext: No edema, warm Neuro: Alert and oriented, No gross deficits  Assessment and plan:  # Type 2 diabetes A1c pending, however patient continues to improve Fasting and nonfasting blood sugars are very well controlled without medication Discontinue metformin   # Hypothyroidism Repeat TSH, no change in Synthroid dose for now Asymptomatic  # Hyperlipidemia Now status post gastric bypass and doing very well Continue Zetia, stop the fibroid, checking levels today to confirm and plan repeat lipid panel in 3 months   Next visit plan Prevnar, may have shingrix after age 4. ( she will be next visit)   Orders Placed This Encounter  Procedures  . Bayer DCA Hb A1c Waived  . CMP14+EGFR  . Lipid panel  . TSH  . CBC with Differential/Platelet    Meds ordered this encounter  Medications  . gabapentin (NEURONTIN) 300 MG capsule    Sig: Take 600 mg by mouth 2 (two) times daily.  Marland Kitchen lactulose (CHRONULAC) 10 GM/15ML solution    Sig: Take by mouth daily as needed for mild constipation.  . sertraline (ZOLOFT) 100 MG tablet    Sig: TAKE (2) TABLETS DAILY    Dispense:  180 tablet    Refill:  3  .  ziprasidone (GEODON) 60 MG capsule    Sig: TAKE 1 CAPSULE IN THE EVENING    Dispense:  30 capsule    Refill:  5  . nystatin (NYAMYC) powder    Sig: APPLY TOPICALLY TWICE A DAY AS NEEDED    Dispense:  30 g    Refill:  0  . dexlansoprazole (DEXILANT) 60 MG capsule    Sig: Take 1 capsule (60 mg total) by mouth daily.    Dispense:  30 capsule    Refill:  [place], MD [facility] 07/06/2017, 8:52 AM

## 2017-07-06 NOTE — Patient Instructions (Signed)
Great to see you!  Stop fenofibrate  Try Dexilant instead of omeprazole  Come back in 3 months unless you need [place] sooner.

## 2017-07-07 LAB — CBC WITH DIFFERENTIAL/PLATELET
BASOS: 1 %
Basophils Absolute: 0 10*3/uL (ref 0.0–0.2)
EOS (ABSOLUTE): 0.2 10*3/uL (ref 0.0–0.4)
EOS: 5 %
HEMATOCRIT: 40.3 % (ref 34.0–46.6)
HEMOGLOBIN: 13.1 g/dL (ref 11.1–15.9)
Immature Grans (Abs): 0 10*3/uL (ref 0.0–0.1)
Immature Granulocytes: 0 %
LYMPHS ABS: 1.3 10*3/uL (ref 0.7–3.1)
Lymphs: 33 %
MCH: 28.1 pg (ref 26.6–33.0)
MCHC: 32.5 g/dL (ref 31.5–35.7)
MCV: 87 fL (ref 79–97)
MONOCYTES: 9 %
MONOS ABS: 0.3 10*3/uL (ref 0.1–0.9)
NEUTROS ABS: 2 10*3/uL (ref 1.4–7.0)
Neutrophils: 52 %
Platelets: 183 10*3/uL (ref 150–379)
RBC: 4.66 x10E6/uL (ref 3.77–5.28)
RDW: 17.2 % — AB (ref 12.3–15.4)
WBC: 3.8 10*3/uL (ref 3.4–10.8)

## 2017-07-07 LAB — CMP14+EGFR
A/G RATIO: 1.9 (ref 1.2–2.2)
ALBUMIN: 4.2 g/dL (ref 3.5–5.5)
ALK PHOS: 49 IU/L (ref 39–117)
ALT: 48 IU/L — ABNORMAL HIGH (ref 0–32)
AST: 57 IU/L — ABNORMAL HIGH (ref 0–40)
BUN / CREAT RATIO: 23 (ref 9–23)
BUN: 19 mg/dL (ref 6–24)
Bilirubin Total: 0.3 mg/dL (ref 0.0–1.2)
CO2: 26 mmol/L (ref 20–29)
CREATININE: 0.81 mg/dL (ref 0.57–1.00)
Calcium: 9.5 mg/dL (ref 8.7–10.2)
Chloride: 98 mmol/L (ref 96–106)
GFR calc Af Amer: 99 mL/min/{1.73_m2} (ref >59)
GFR calc non Af Amer: 86 mL/min/{1.73_m2} (ref >59)
GLOBULIN, TOTAL: 2.2 g/dL (ref 1.5–4.5)
Glucose: 77 mg/dL (ref 65–99)
POTASSIUM: 3.8 mmol/L (ref 3.5–5.2)
SODIUM: 143 mmol/L (ref 134–144)
Total Protein: 6.4 g/dL (ref 6.0–8.5)

## 2017-07-07 LAB — LIPID PANEL
CHOL/HDL RATIO: 5.3 ratio — AB (ref 0.0–4.4)
CHOLESTEROL TOTAL: 208 mg/dL — AB (ref 100–199)
HDL: 39 mg/dL — ABNORMAL LOW (ref >39)
LDL CALC: 124 mg/dL — AB (ref 0–99)
Triglycerides: 223 mg/dL — ABNORMAL HIGH (ref 0–149)
VLDL Cholesterol Cal: 45 mg/dL — ABNORMAL HIGH (ref 5–40)

## 2017-07-07 LAB — TSH: TSH: 1.81 u[IU]/mL (ref 0.450–4.500)

## 2017-07-07 LAB — BAYER DCA HB A1C WAIVED: HB A1C (BAYER DCA - WAIVED): 5.2 % (ref <7.0)

## 2017-07-10 ENCOUNTER — Encounter: Payer: Self-pay | Admitting: Pharmacist

## 2017-07-10 ENCOUNTER — Ambulatory Visit (INDEPENDENT_AMBULATORY_CARE_PROVIDER_SITE_OTHER): Payer: Medicare Other | Admitting: Pharmacist

## 2017-07-10 VITALS — BP 112/72 | HR 76 | Ht 64.0 in | Wt 211.5 lb

## 2017-07-10 DIAGNOSIS — E119 Type 2 diabetes mellitus without complications: Secondary | ICD-10-CM

## 2017-07-10 DIAGNOSIS — Z0000 Encounter for general adult medical examination without abnormal findings: Secondary | ICD-10-CM | POA: Diagnosis not present

## 2017-07-10 DIAGNOSIS — Z800 Family history of malignant neoplasm of digestive organs: Secondary | ICD-10-CM

## 2017-07-10 MED ORDER — LINACLOTIDE 72 MCG PO CAPS: 72.0000 ug | ORAL_CAPSULE | Freq: Every day | ORAL | 0 refills | Status: DC | PRN

## 2017-07-10 NOTE — Patient Instructions (Addendum)
  Morgan [name] , Thank you for taking time to come for your Medicare Wellness Visit. I appreciate your ongoing commitment to your health goals. Please review the following plan we discussed and let me know if I can assist you in the future.   These are the goals we discussed:  Look for copy of [place] (important to know where these are kept - you can also bring copy to our office to be placed in our file / electronic chart)  Continue to exercise - 3 times per week   Continue current diet - great job!  Linzess 44mcg - take 1 tablet as needed for constipation.  Do not take with Lactulose.    This is a list of the screening recommended for you and due dates:  Health Maintenance  Topic Date Due  . Mammogram  05/26/2017  . Urine Protein Check  07/05/2017  . HIV Screening  09/15/2017*  . Flu Shot  07/12/2017  . Colon Cancer Screening  08/23/2017  . Hemoglobin A1C  01/06/2018  . Eye exam for diabetics  04/18/2018  . Pap Smear  05/28/2018  . Complete foot exam   07/10/2018  . Pneumococcal vaccine (2) 06/29/2021  . Tetanus Vaccine  07/08/2025  *Topic was postponed. The date shown is not the original due date.

## 2017-07-10 NOTE — Progress Notes (Signed)
Patient ID: Morgan [name], female   DOB: 1966/08/05, 51 y.o.   MRN: [id number]    Subjective:   Morgan [name] is a 51 y.o. female who presents for a subsequent Medicare Annual Wellness Visit.  Mrs. Holben states that she feels her health has greatly improved since last year's AWV.  In April 2018 she had bariatric surgery.  Leading up to surgery she had lost about 48lbs and since surgery she has lost about 55lbs more.  She has been able to stop all medications for diabetes and also stopped all blood pressure medications with the exception of propranolol which she takes for HAs.  She reports that breathing has improved and she is have some knee pain but improved.  Unfortunately the weight loss has caused her to begin menstruating again which has worsened her HA's.  She has plans to see her neurologist for Botox injections for HAs but appt is not until October 2018.   She also reports increase constipation.  She had taken Linzess 171mcg qd in the past with good success but after bariatric surgery she had diarrhea after Linzess dosing. Was switched to lactulose 90mL prn but this has not help much. She also has been out of probiotic for several weeks. Reports small hard BM every 2-3 days.  Social History: Occupational history: disabled due to multiple conditions - ortho, neurological and psychiatric Marital history: single No children Alcohol/Tobacco/Substances: none   Current Medications (verified) Outpatient Encounter Prescriptions as of 07/10/2017  Medication Sig  . calcium-vitamin D (OSCAL WITH D) 500-200 MG-UNIT tablet Take 1 tablet by mouth 3 (three) times daily.  . Cholecalciferol (VITAMIN D) 2000 UNITS CAPS Take 2,000 Units by mouth daily.  Marland Kitchen dexlansoprazole (DEXILANT) 60 MG capsule Take 1 capsule (60 mg total) by mouth daily.  . diclofenac sodium (VOLTAREN) 1 % GEL Apply 2 g topically 2 (two) times daily as needed (JOINT PAIN). Use as directed for knee pain  . ezetimibe (ZETIA) 10  MG tablet Take 1 tablet (10 mg total) by mouth daily.  . fluticasone (FLONASE) 50 MCG/ACT nasal spray SPRAY 1 SPRAY IN EACH NOSTRIL TWICE DAILY. SHAKE GENTLY BEFORE EACH [place] E.  . gabapentin (NEURONTIN) 300 MG capsule Take 600 mg by mouth 2 (two) times daily.  Marland Kitchen lactulose (CHRONULAC) 10 GM/15ML solution Take by mouth daily as needed for mild constipation.  Marland Kitchen levothyroxine (SYNTHROID, LEVOTHROID) 150 MCG tablet TAKE 1 TABLET DAILY  . loratadine (CLARITIN) 10 MG tablet Take 10 mg by mouth daily as needed for allergies. Prn    . Multiple Vitamin (MULTIVITAMIN) capsule Take 2 capsules by mouth daily.   Marland Kitchen nystatin (NYAMYC) powder APPLY TOPICALLY TWICE A DAY AS NEEDED  . ONE TOUCH ULTRA TEST test strip CHECK BLOOD SUGAR TWICE A DAY AS DIRECTED  . OXYGEN Inhale 2 L into the lungs Nightly.  Marland Kitchen PAZEO 0.7 % SOLN Place 1 drop into both eyes every morning.  . Probiotic Product (HEALTHY COLON PO) Take 2 capsules by mouth daily.   . propranolol (INDERAL) 40 MG tablet TAKE (1) TABLET TWICE A DAY.  Marland Kitchen RESTASIS MULTIDOSE 0.05 % ophthalmic emulsion Place 1 drop into both eyes 2 (two) times daily.  . sertraline (ZOLOFT) 100 MG tablet TAKE (2) TABLETS DAILY  . SUMAtriptan 6 MG/0.5ML SOAJ Inject 0.5 mLs in skin every 2 hours as needed for migraine or headache.  Marland Kitchen tiZANidine (ZANAFLEX) 2 MG tablet Take 1 tablet (2 mg total) by mouth 3 (three) times daily. (Patient taking differently: Take  2 mg by mouth every 8 (eight) hours as needed for muscle spasms. )  . ziprasidone (GEODON) 60 MG capsule TAKE 1 CAPSULE IN THE EVENING  . linaclotide (LINZESS) 72 MCG capsule Take 1 capsule (72 mcg total) by mouth daily as needed.   Facility-Administered Encounter Medications as of 07/10/2017  Medication  . cyanocobalamin ((VITAMIN B-12)) injection 1,000 mcg    Allergies (verified) Ketoconazole; Pravachol [pravastatin sodium]; Codeine; and Statins   History: Past Medical History:  Diagnosis Date  . Allergic rhinitis   .  Barrett's esophagus   . Bipolar affective disorder ([place])   . Chronic respiratory failure ([place])   . Constipation, chronic   . Diabetes mellitus without complication ([place])    type 2   . Dry eye   . Gastroparesis   . GERD (gastroesophageal reflux disease)   . History of bariatric surgery 03/2017  . Hyperlipidemia   . Hypertension   . Intractable chronic migraine without aura 06/04/2015  . Migraine headache   . Morbid obesity ([place])   . OSA (obstructive sleep apnea)    uses a cpap with 2 units o2   . Osteoarthritis    bilateral knee  . Sleep apnea    has c-pap  . Unspecified hypothyroidism   . Vitamin B 12 deficiency   . Vitamin D deficiency    Past Surgical History:  Procedure Laterality Date  . APPENDECTOMY  1978  . CHOLECYSTECTOMY  2005  . GASTRIC ROUX-EN-Y N/A 03/21/2017   Procedure: LAPAROSCOPIC ROUX-EN-Y GASTRIC, UPPER ENDO;  Surgeon: Greer Pickerel, MD;  Location: [facility];  Service: General;  Laterality: N/A;  . SHOULDER ARTHROSCOPY  7/12   left-dsc  . TONSILLECTOMY  at age 26  . TRIGGER FINGER RELEASE  12/20/2012   Procedure: RELEASE TRIGGER FINGER/A-1 PULLEY;  Surgeon: Tennis Must, MD;  Location: [place];  Service: Orthopedics;  Laterality: Left;  LEFT TRIGGER THUMB RELEASE   Family History  Problem Relation Age of Onset  . Asthma Father   . Allergies Father   . Heart disease Father        enlarged heart  . Peripheral vascular disease Father   . Diabetes Father   . Hyperlipidemia Father   . Arthritis Father   . Asthma Sister   . Cancer Sister        colon at 57 yr old.  . Colon cancer Sister   . Allergies Mother   . Stroke Mother 2       with hemi paralysis  . Diabetes Mother   . Hyperlipidemia Mother   . Hypertension Mother   . GI problems Mother   . Arthritis Mother   . Allergies Brother   . Early death Brother 9       congenital abormality  . Allergies Sister   . Diabetes Sister   . Asthma Sister   . Colon polyps Sister   .  Hyperlipidemia Sister   . GI problems Sister        gastroporesis   . Liver disease Sister        fatty liver  . Stroke Sister        intercrandial bleed  . Diabetes Brother   . Hypertension Brother   . Hyperlipidemia Brother   . Diabetes Daughter   . Hyperlipidemia Daughter   . Hypertension Daughter   . Heart disease Paternal Aunt   . Migraines Neg Hx    Social History   Occupational History  . disabled Unemployed  Social History Main Topics  . Smoking status: Never Smoker  . Smokeless tobacco: Never Used  . Alcohol use No  . Drug use: No  . Sexual activity: No    Do you feel safe at home?  Yes Are there smokers in your home (other than you)? No  Dietary issues and exercise activities: Current Exercise Habits: Structured exercise class, Type of exercise: Other - see comments (water aerobics), Time (Minutes): 60, Frequency (Times/Week): 3, Weekly Exercise (Minutes/Week): 180, Intensity: Moderate  Current Dietary habits:  Eating small portions.  No carbonated drinks.  Lean proteins and small servings of starch.  Eats lots of fruits and vegetables.   Objective:    Today's Vitals   07/10/17 0855 07/10/17 0901  BP: 112/72   Pulse: 76   Weight: 211 lb 8 oz (95.9 kg)   Height: 5\' 4"  (1.626 m)   PainSc:  3    Body mass index is 36.3 kg/m.   Last A1c = 5.2% (07/06/2017)  Diabetic Foot Form - Detailed   Diabetic Foot Exam - detailed Diabetic Foot exam was performed with the following findings:  Yes 07/10/2017  9:08 AM  Visual Foot Exam completed.:  Yes  Can the patient see the bottom of their feet?:  Yes Are the shoes appropriate in style and fit?:  Yes Is there swelling or and abnormal foot shape?:  No Is there a claw toe deformity?:  No Is there elevated skin temparature?:  No Is there foot or ankle muscle weakness?:  No Normal Range of Motion:  Yes Pulse Foot Exam completed.:  Yes  Right posterior Tibialias:  Present Left posterior Tibialias:  Present    Right Dorsalis Pedis:  Present Left Dorsalis Pedis:  Present  Sensory Foot Exam Completed.:  Yes Semmes-Weinstein Monofilament Test R Site 1-Great Toe:  Pos L Site 1-Great Toe:  Pos         Activities of Daily Living In your present state of health, do you have any difficulty performing the following activities: 07/10/2017 03/21/2017  Hearing? N N  Vision? N Y  Difficulty concentrating or making decisions? N N  Walking or climbing stairs? N N  Dressing or bathing? N N  Doing errands, shopping? N N  Preparing Food and eating ? N -  Using the Toilet? N -  In the past six months, have you accidently leaked urine? N -  Do you have problems with loss of bowel control? N -  Managing your Medications? N -  Managing your Finances? N -  Housekeeping or managing your Housekeeping? N -  Some recent data might be hidden     Cardiac Risk Factors include: diabetes mellitus;dyslipidemia;family history of premature cardiovascular disease  Depression Screen PHQ 2/9 Scores 07/10/2017 07/06/2017 05/10/2017 03/30/2017  PHQ - 2 Score 2 4 1 6   PHQ- 9 Score 6 7 - 18     Fall Risk Fall Risk  07/10/2017 07/06/2017 03/30/2017 03/08/2017 12/07/2016  Falls in the past year? No No No No No    Cognitive Function: MMSE - Mini Mental State Exam 07/10/2017 06/29/2016  Orientation to time 5 5  Orientation to Place 5 5  Registration 3 3  Attention/ Calculation 5 5  Recall 3 3  Language- name 2 objects 2 2  Language- repeat 1 1  Language- follow 3 step command 3 3  Language- read & follow direction 1 1  Write a sentence 1 1  Copy design 1 1  Total score 30 30  Immunizations and Health Maintenance Immunization History  Administered Date(s) Administered  . Influenza,inj,Quad PF,36+ Mos 09/29/2016  . Pneumococcal Polysaccharide-23 06/29/2016  . Tdap 07/09/2015   Health Maintenance Due  Topic Date Due  . MAMMOGRAM  05/26/2017  . URINE MICROALBUMIN  07/05/2017    Patient Care Team: Timmothy Euler, MD as PCP - General (Family Medicine) Allyn Kenner, DO as Attending Physician (Obstetrics and Gynecology) Marchia Bond, MD as Attending Physician (Orthopedic Surgery) Mixon, Vinie Sill as Consulting Physician (Unknown Physician Specialty) Harlen Labs, MD as Referring Physician (Optometry) Deneise Lever, MD as Consulting Physician (Pulmonary Disease) Almedia Balls, MD as Consulting Physician (Orthopedic Surgery) Skeet Latch, MD as Attending Physician (Cardiology) Greer Pickerel, MD as Consulting Physician (General Surgery)  Indicate any recent Medical Services you may have received from other than [facility] providers in the past year (date may be approximate).    Assessment:    Annual Wellness Visit  Obesity  Type 2 DM   Screening Tests Health Maintenance  Topic Date Due  . MAMMOGRAM  05/26/2017  . URINE MICROALBUMIN  07/05/2017  . HIV Screening  09/15/2017 (Originally 08/10/1982)  . INFLUENZA VACCINE  07/12/2017  . COLONOSCOPY  08/23/2017  . HEMOGLOBIN A1C  01/06/2018  . OPHTHALMOLOGY EXAM  04/18/2018  . PAP SMEAR  05/28/2018  . FOOT EXAM  07/10/2018  . PNEUMOCOCCAL POLYSACCHARIDE VACCINE (2) 06/29/2021  . TETANUS/TDAP  07/08/2025        Plan:   During the course of the visit Adriyanna was educated and counseled about the following appropriate screening and preventive services:   Vaccines to include Pneumoccal, Influenza, Td - patient UTD on all required vaccines.  She will turn 51 yo in about 1 month and will get Shingrix vaccine at next visit  Colorectal cancer screening - UTD but gets every 5 years due to family history and is due colonoscopy 08/2017  Cardiovascular disease screening - UTD  Diabetes - continues to be well controlled since stopping meds and with weight loss  Mammogram - done July 2018 - requesting records from [facility]  PAP - UTD - requesting records form [place] [facility]  Glaucoma screening / Diabetic Eye Exam -  UTD  Nutrition counseling - continue with current diet  Smoking cessation counseling   Advanced Directives - requested copy of AD's  Physical Activity - continue with water aerobics.   Gave #12 samples of lower Linzess dose 81mcg qd to take as needed for constipation  F/U with neurologist as planned - patient is to call them to see if earlier appt available  F/U with PCP as planned for October 2018  Orders Placed This Encounter  Procedures  . Microalbumin / creatinine urine ratio  . Ambulatory referral to Gastroenterology    Referral Priority:   Routine    Referral Type:   Consultation    Referral Reason:   Specialty Services Required    Referred to Provider:   Ike Bene    Requested Specialty:   Gastroenterology    Number of Visits Requested:   1     Patient Instructions (the written plan) were given to the patient.   Cherre Robins, PharmD   07/10/2017

## 2017-07-11 LAB — MICROALBUMIN / CREATININE URINE RATIO
CREATININE, UR: 293.8 mg/dL
MICROALB/CREAT RATIO: 7.6 mg/g{creat} (ref 0.0–30.0)
Microalbumin, Urine: 22.2 ug/mL

## 2017-07-18 ENCOUNTER — Ambulatory Visit (INDEPENDENT_AMBULATORY_CARE_PROVIDER_SITE_OTHER): Payer: Medicare Other | Admitting: Adult Health

## 2017-07-18 ENCOUNTER — Encounter: Payer: Self-pay | Admitting: Adult Health

## 2017-07-18 VITALS — BP 120/77 | HR 52 | Ht 64.0 in | Wt 213.2 lb

## 2017-07-18 DIAGNOSIS — G43719 Chronic migraine without aura, intractable, without status migrainosus: Secondary | ICD-10-CM

## 2017-07-18 NOTE — Progress Notes (Signed)
Consent Form Botulism Toxin Injection For Chronic Migraine  Botulism toxin has been approved by the Federal drug administration for treatment of chronic migraine. Botulism toxin does not cure chronic migraine and it may not be effective in some patients.  The administration of botulism toxin is accomplished by injecting a small amount of toxin into the muscles of the neck and head. Dosage must be titrated for each individual. Any benefits resulting from botulism toxin tend to wear off after 3 months with a repeat injection required if benefit is to be maintained. Injections are usually done every 3-4 months with maximum effect peak achieved by about 2 or 3 weeks. Botulism toxin is expensive and you should be sure of what costs you will incur resulting from the injection.  The side effects of botulism toxin use for chronic migraine may include:   -Transient, and usually mild, facial weakness with facial injections  -Transient, and usually mild, head or neck weakness with head/neck injections  -Reduction or loss of forehead facial animation due to forehead muscle              weakness  -Eyelid drooping  -Dry eye  -Pain at the site of injection or bruising at the site of injection  -Double vision  -Potential unknown long term risks  Contraindications: You should not have Botox if you are pregnant, nursing, allergic to albumin, have an infection, skin condition, or muscle weakness at the site of the injection, or have myasthenia gravis, Lambert-Eaton syndrome, or ALS.  It is also possible that as with any injection, there may be an allergic reaction or no effect from the medication. Reduced effectiveness after repeated injections is sometimes seen and rarely infection at the injection site may occur. All care will be taken to prevent these side effects. If therapy is given over a long time, atrophy and wasting in the muscle injected may occur. Occasionally the patient's become refractory to  treatment because they develop antibodies to the toxin. In this event, therapy needs to be modified.  I have read the above information and consent to the administration of botulism toxin.    ______________  _____   _________________  Patient signature     Date   Witness signature       BOTOX PROCEDURE NOTE FOR MIGRAINE HEADACHE    Contraindications and precautions discussed with patient(above). Aseptic procedure was observed and patient tolerated procedure. Procedure performed by Ward Givens, NP-C  The condition has existed for more than 6 months, and pt does not have a diagnosis of ALS, Myasthenia Gravis or Lambert-Eaton Syndrome. Risks and benefits of injections discussed and pt agrees to proceed with the procedure. Written consent obtained  These injections are medically necessary. He receives good benefits from these injections. These injections do not cause sedations or hallucinations which the oral therapies may cause.  Indication/Diagnosis: chronic migraine BOTOX(J0585) injection was performed according to protocol by [employer]. 200 units of BOTOX was dissolved into 4 cc NS.  NDC: [address]-02  Type of toxin: Botox  Lot #[id number] EXP: 12/2019  NS:  [place] 24235-361-44 Lot # [id number] Exp: 03/20  Description of procedure:  The patient was placed in a sitting position. The standard protocol was used for Botox as follows, with 5 units of Botox injected at each site:   -Procerus muscle, midline injection  -Corrugator muscle, bilateral injection  -Frontalis muscle, bilateral injection, with 2 sites each side, medial injection was performed in the upper one third of the frontalis muscle, in  the region vertical from the medial inferior edge of the superior orbital rim. The lateral injection was again in the upper one third of the forehead vertically above the lateral limbus of the cornea, 1.5 cm lateral to the medial injection site.  -Temporalis muscle injection, 4  sites, bilaterally. The first injection was 3 cm above the tragus of the ear, second injection site was 1.5 cm to 3 cm up from the first injection site in line with the tragus of the ear. The third injection site was 1.5-3 cm forward between the first 2 injection sites. The fourth injection site was 1.5 cm posterior to the second injection site.  -Occipitalis muscle injection, 3 sites, bilaterally. The first injection was done one half way between the occipital protuberance and the tip of the mastoid process behind the ear. The second injection site was done lateral and superior to the first, 1 fingerbreadth from the first injection. The third injection site was 1 fingerbreadth superiorly and medially from the first injection site.  -Cervical paraspinal muscle injection, 2 sites, bilateral knee first injection site was 1 cm from the midline of the cervical spine, 3 cm inferior to the lower border of the occipital protuberance. The second injection site was 1.5 cm superiorly and laterally to the first injection site.  -Trapezius muscle injection was performed at 3 sites, bilaterally. The first injection site was in the upper trapezius muscle halfway between the inflection point of the neck, and the acromion. The second injection site was one half way between the acromion and the first injection site. The third injection was done between the first injection site and the inflection point of the neck.  - Orbicularis Oculi: injection 1 site bilaterally approximately 1 fingerbreadth from the lateral canthus   Will return for repeat injection in 3 months.   A 200 unit sof Botox was used. The patient tolerated the procedure well, there were no complications of the above procedure. Patient was alert and oriented. Ambulated into the exam room without difficulty.

## 2017-07-21 NOTE — Progress Notes (Signed)
I have read the note, and I agree with the clinical assessment and plan.  Morgan [name]   

## 2017-07-24 DIAGNOSIS — M170 Bilateral primary osteoarthritis of knee: Secondary | ICD-10-CM | POA: Diagnosis not present

## 2017-07-25 ENCOUNTER — Ambulatory Visit (INDEPENDENT_AMBULATORY_CARE_PROVIDER_SITE_OTHER): Payer: Medicare Other | Admitting: *Deleted

## 2017-07-25 DIAGNOSIS — E538 Deficiency of other specified B group vitamins: Secondary | ICD-10-CM | POA: Diagnosis not present

## 2017-07-25 NOTE — Progress Notes (Signed)
Pt given Vit B12 inj Tolerated well 

## 2017-07-29 DIAGNOSIS — G4733 Obstructive sleep apnea (adult) (pediatric): Secondary | ICD-10-CM | POA: Diagnosis not present

## 2017-07-29 DIAGNOSIS — R0902 Hypoxemia: Secondary | ICD-10-CM | POA: Diagnosis not present

## 2017-08-01 ENCOUNTER — Other Ambulatory Visit: Payer: Self-pay | Admitting: Family Medicine

## 2017-08-01 DIAGNOSIS — E785 Hyperlipidemia, unspecified: Secondary | ICD-10-CM

## 2017-08-07 DIAGNOSIS — N920 Excessive and frequent menstruation with regular cycle: Secondary | ICD-10-CM | POA: Diagnosis not present

## 2017-08-09 ENCOUNTER — Other Ambulatory Visit: Payer: Self-pay | Admitting: *Deleted

## 2017-08-09 DIAGNOSIS — M25561 Pain in right knee: Secondary | ICD-10-CM | POA: Diagnosis not present

## 2017-08-16 MED ORDER — LACTULOSE 10 GM/15ML PO SOLN: 10.0000 g | Freq: Every day | ORAL | 0 refills | Status: DC | PRN

## 2017-08-17 DIAGNOSIS — M25561 Pain in right knee: Secondary | ICD-10-CM | POA: Diagnosis not present

## 2017-08-21 DIAGNOSIS — M25561 Pain in right knee: Secondary | ICD-10-CM | POA: Diagnosis not present

## 2017-08-24 DIAGNOSIS — Z9989 Dependence on other enabling machines and devices: Secondary | ICD-10-CM | POA: Diagnosis not present

## 2017-08-24 DIAGNOSIS — R1013 Epigastric pain: Secondary | ICD-10-CM | POA: Diagnosis not present

## 2017-08-24 DIAGNOSIS — G4733 Obstructive sleep apnea (adult) (pediatric): Secondary | ICD-10-CM | POA: Diagnosis not present

## 2017-08-24 DIAGNOSIS — Z9884 Bariatric surgery status: Secondary | ICD-10-CM | POA: Diagnosis not present

## 2017-08-29 ENCOUNTER — Ambulatory Visit (INDEPENDENT_AMBULATORY_CARE_PROVIDER_SITE_OTHER): Payer: Medicare Other | Admitting: *Deleted

## 2017-08-29 DIAGNOSIS — E538 Deficiency of other specified B group vitamins: Secondary | ICD-10-CM

## 2017-08-29 DIAGNOSIS — R0902 Hypoxemia: Secondary | ICD-10-CM | POA: Diagnosis not present

## 2017-08-29 DIAGNOSIS — G4733 Obstructive sleep apnea (adult) (pediatric): Secondary | ICD-10-CM | POA: Diagnosis not present

## 2017-08-29 NOTE — Progress Notes (Signed)
Pt given Cyanocobalamin inj Tolerated well 

## 2017-09-18 ENCOUNTER — Encounter: Payer: Self-pay | Admitting: Skilled Nursing Facility1

## 2017-09-18 ENCOUNTER — Encounter: Payer: Medicare Other | Attending: General Surgery | Admitting: Skilled Nursing Facility1

## 2017-09-18 DIAGNOSIS — E8881 Metabolic syndrome: Secondary | ICD-10-CM | POA: Diagnosis not present

## 2017-09-18 DIAGNOSIS — E119 Type 2 diabetes mellitus without complications: Secondary | ICD-10-CM

## 2017-09-18 DIAGNOSIS — Z9884 Bariatric surgery status: Secondary | ICD-10-CM | POA: Insufficient documentation

## 2017-09-18 DIAGNOSIS — Z6841 Body Mass Index (BMI) 40.0 and over, adult: Secondary | ICD-10-CM | POA: Diagnosis not present

## 2017-09-18 DIAGNOSIS — G4733 Obstructive sleep apnea (adult) (pediatric): Secondary | ICD-10-CM | POA: Insufficient documentation

## 2017-09-18 DIAGNOSIS — K219 Gastro-esophageal reflux disease without esophagitis: Secondary | ICD-10-CM | POA: Diagnosis not present

## 2017-09-18 DIAGNOSIS — Z713 Dietary counseling and surveillance: Secondary | ICD-10-CM | POA: Diagnosis not present

## 2017-09-18 DIAGNOSIS — E1169 Type 2 diabetes mellitus with other specified complication: Secondary | ICD-10-CM | POA: Insufficient documentation

## 2017-09-18 DIAGNOSIS — I10 Essential (primary) hypertension: Secondary | ICD-10-CM | POA: Insufficient documentation

## 2017-09-18 DIAGNOSIS — K5909 Other constipation: Secondary | ICD-10-CM | POA: Diagnosis not present

## 2017-09-18 NOTE — Progress Notes (Signed)
  Pt states her surgeon thinks she may have an ulcer in her pouch: symptoms: tightness in her stomach, gurgling in stomach, after eating feeling like it is stuck. Pt states she has been chewing well and eating an appropriate amount at once. Pt states she is taking the linzess which helps her have bowel movements. Pt states she tried the kefir and felt it helped. Pt states walking is tough because of her knees. Pt states she thinks she is having a lot of gas stuck up inside of her.   Surgery date: 03/21/2017 Surgery type: RYGB [facility]: 312.5 Weight today: 185 Weight Change: 26  TANITA  BODY COMP RESULTS  04/04/2017 05/16/2017 06/15/2017 09/18/2017   BMI (kg/m^2) 42.7 40.0 37.8 31.8   Fat Mass (lbs) 118.2 110.4 100.2 65.4   Fat Free Mass (lbs) 130.6 122.8 120.2 119.6   Total Body Water (lbs) 95.6 89.4 87 85   24-hr recall: B (AM): 2 boiled eggs with 1 or 2 slices bacon (38B) Snk (AM): gold fish L (PM): 4 ounces of tuna (28g)---chicken and vegetables (beans or carrots) Snk (PM): peach or yogurt: dannon  D (PM): chicken and vegetables (28g) Snk (PM):   Fluid intake: water, unsweet tea: Aiming for 65-70 fluid ounces  Estimated total protein intake: 70  Medications: taking Carafate and protonix  Supplementation: bariatric celebrate and calcium   CBG monitoring: 2 times a day Average CBG per patient: under 75-101 Last patient reported A1c: 5.2  Using straws: no Drinking while eating: no Having you been chewing well: yes Chewing/swallowing difficulties: no Changes in vision: no Changes to mood/headaches: no Hair loss/Cahnges to skin/Changes to nails: no Any difficulty focusing or concentrating: no Sweating: no Dizziness/Lightheaded: no Palpitations: no  Carbonated beverages: no N/V/D/C/GAS:  Constipation  Abdominal Pain: no Dumping syndrome: no  Recent physical activity:  Water aerobics 3 days week 60 minutes   Progress Towards Goal(s):  In  progress.  Handouts given during visit include:  NS veggies + protein   Nutritional Diagnosis:  [place]-3.3 Overweight/obesity related to past poor dietary habits and physical inactivity as evidenced by patient w/ recent RYGB surgery following dietary guidelines for continued weight loss.    Intervention:  Nutrition counseling. Dietitian educated the pt on advancing her diet to include ns veggies while having enough room to add more fluid into her day. Goals: -Drink 4 ounces of kefir every morning -Bring your knee to your chest for gas relief  -Chew until applesauce consistency -Take bites the size of your thumb nail -One new food a day  -Try the multivitamin capsules Teaching Method Utilized:  Visual Auditory Hands on  Barriers to learning/adherence to lifestyle change: lack of internal cues for hydration/blood sugars  Demonstrated degree of understanding via:  Teach Back   Monitoring/Evaluation:  Dietary intake, exercise, and body weight.

## 2017-09-18 NOTE — Patient Instructions (Addendum)
-  Drink 4 ounces of kefir every morning  -Bring your knee to your chest for gas relief   -Chew until applesauce consistency  -Take bites the size of your thumb nail  -One new food a day   -Try the multivitamin capsules

## 2017-09-22 DIAGNOSIS — R1013 Epigastric pain: Secondary | ICD-10-CM | POA: Diagnosis not present

## 2017-09-22 DIAGNOSIS — Z800 Family history of malignant neoplasm of digestive organs: Secondary | ICD-10-CM | POA: Diagnosis not present

## 2017-09-22 DIAGNOSIS — K5904 Chronic idiopathic constipation: Secondary | ICD-10-CM | POA: Diagnosis not present

## 2017-09-27 ENCOUNTER — Ambulatory Visit: Payer: Medicare Other | Admitting: Adult Health

## 2017-09-28 DIAGNOSIS — G4733 Obstructive sleep apnea (adult) (pediatric): Secondary | ICD-10-CM | POA: Diagnosis not present

## 2017-09-28 DIAGNOSIS — R0902 Hypoxemia: Secondary | ICD-10-CM | POA: Diagnosis not present

## 2017-10-02 DIAGNOSIS — M25562 Pain in left knee: Secondary | ICD-10-CM | POA: Diagnosis not present

## 2017-10-02 DIAGNOSIS — M5431 Sciatica, right side: Secondary | ICD-10-CM | POA: Diagnosis not present

## 2017-10-03 ENCOUNTER — Ambulatory Visit (INDEPENDENT_AMBULATORY_CARE_PROVIDER_SITE_OTHER): Payer: Medicare Other | Admitting: *Deleted

## 2017-10-03 DIAGNOSIS — E538 Deficiency of other specified B group vitamins: Secondary | ICD-10-CM

## 2017-10-03 NOTE — Progress Notes (Signed)
Pt given Cyanocobalamin inj Tolerated well 

## 2017-10-05 DIAGNOSIS — N920 Excessive and frequent menstruation with regular cycle: Secondary | ICD-10-CM | POA: Diagnosis not present

## 2017-10-06 ENCOUNTER — Ambulatory Visit (INDEPENDENT_AMBULATORY_CARE_PROVIDER_SITE_OTHER): Payer: Medicare Other | Admitting: Family Medicine

## 2017-10-06 ENCOUNTER — Encounter: Payer: Self-pay | Admitting: Family Medicine

## 2017-10-06 VITALS — BP 158/90 | HR 61 | Temp 98.5°F | Ht 64.0 in | Wt 183.0 lb

## 2017-10-06 DIAGNOSIS — E785 Hyperlipidemia, unspecified: Secondary | ICD-10-CM

## 2017-10-06 DIAGNOSIS — Z0000 Encounter for general adult medical examination without abnormal findings: Secondary | ICD-10-CM

## 2017-10-06 DIAGNOSIS — E039 Hypothyroidism, unspecified: Secondary | ICD-10-CM

## 2017-10-06 DIAGNOSIS — E119 Type 2 diabetes mellitus without complications: Secondary | ICD-10-CM

## 2017-10-06 DIAGNOSIS — I10 Essential (primary) hypertension: Secondary | ICD-10-CM

## 2017-10-06 DIAGNOSIS — M25562 Pain in left knee: Secondary | ICD-10-CM | POA: Diagnosis not present

## 2017-10-06 DIAGNOSIS — Z23 Encounter for immunization: Secondary | ICD-10-CM

## 2017-10-06 DIAGNOSIS — E1169 Type 2 diabetes mellitus with other specified complication: Secondary | ICD-10-CM

## 2017-10-06 LAB — BAYER DCA HB A1C WAIVED: HB A1C (BAYER DCA - WAIVED): 4.8 % (ref <7.0)

## 2017-10-06 MED ORDER — LINACLOTIDE 145 MCG PO CAPS: 145.0000 ug | ORAL_CAPSULE | Freq: Every day | ORAL | 3 refills | Status: DC

## 2017-10-06 NOTE — Patient Instructions (Signed)
Great to see you!  Come back in 3 months unless you need us sooner.    

## 2017-10-06 NOTE — Progress Notes (Signed)
   HPI  Patient presents today for follow-up of chronic medical conditions.  Patient is doing well after gastric bypass.  She has had some abdominal discomfort and her GI physician and surgeon feel that she likely has gastritis or even possibly a gastric pouch ulcer.  She is on Dexilant once daily.  She will discuss with her doctor whether or not she needs to go twice daily.  Blood sugars at home have ranged 80s to low 100s. She is still watching her diet.  She is exercising regularly.  Her energy is good.  Blood pressure at home is been average 110-130 over 80s. No headaches or chest pain.   She is having increasing and worsening knee pain.  She is using tramadol for this.  She is seeing a Psychologist, sport and exercise to consider a knee replacement of the left knee.  PMH: Smoking status noted ROS: Per HPI  Objective: BP (!) 158/90   Pulse 61   Temp 98.5 F (36.9 C) (Oral)   Ht 5\' 4"  (1.626 m)   Wt 183 lb (83 kg)   BMI 31.41 kg/m  Gen: NAD, alert, cooperative with exam HEENT: NCAT CV: RRR, good S1/S2, no murmur Resp: CTABL, no wheezes, non-labored Ext: No edema, warm Neuro: Alert and oriented, No gross deficits  Assessment and plan:  #Type 2 diabetes Blood sugars very well controlled, A1c pending. No medications, diet controlled improved much after gastric bypass  #Hypertension Elevated today, however controlled at home, likely white coat hypertension. Continue to monitor, no changes  #Hyperlipidemia Patient has been de-escalating medications, repeat labs Continue current medications for now Zetia plus statin  #Hypothyroidism Repeating TSH frequently to watch for changes in absorption given gastric bypass Repeat labs Asymptomatic  Shingles vaccine-counseling provided for all vaccine components    No orders of the defined types were placed in this encounter.   Meds ordered this encounter  Medications  . linaclotide (LINZESS) 145 MCG CAPS capsule    Sig: Take 1 capsule by  mouth daily.  . traMADol (ULTRAM) 50 MG tablet    Sig: Take 1 tablet by mouth every 6 (six) hours as needed.    Laroy Apple, MD [facility] 10/06/2017, 8:13 AM

## 2017-10-06 NOTE — Addendum Note (Signed)
Addended by: Karle Plumber on: 10/06/2017 08:28 AM   Modules accepted: Orders

## 2017-10-07 LAB — CMP14+EGFR
A/G RATIO: 1.9 (ref 1.2–2.2)
ALBUMIN: 4 g/dL (ref 3.5–5.5)
ALK PHOS: 62 IU/L (ref 39–117)
ALT: 11 IU/L (ref 0–32)
AST: 18 IU/L (ref 0–40)
BUN / CREAT RATIO: 13 (ref 9–23)
BUN: 7 mg/dL (ref 6–24)
Bilirubin Total: 0.4 mg/dL (ref 0.0–1.2)
CHLORIDE: 102 mmol/L (ref 96–106)
CO2: 24 mmol/L (ref 20–29)
CREATININE: 0.54 mg/dL — AB (ref 0.57–1.00)
Calcium: 9.2 mg/dL (ref 8.7–10.2)
GFR calc Af Amer: 127 mL/min/{1.73_m2} (ref >59)
GFR calc non Af Amer: 110 mL/min/{1.73_m2} (ref >59)
Globulin, Total: 2.1 g/dL (ref 1.5–4.5)
Glucose: 76 mg/dL (ref 65–99)
POTASSIUM: 3.5 mmol/L (ref 3.5–5.2)
SODIUM: 147 mmol/L — AB (ref 134–144)
Total Protein: 6.1 g/dL (ref 6.0–8.5)

## 2017-10-07 LAB — TSH: TSH: 0.632 u[IU]/mL (ref 0.450–4.500)

## 2017-10-07 LAB — CBC WITH DIFFERENTIAL/PLATELET
Basophils Absolute: 0 10*3/uL (ref 0.0–0.2)
Basos: 0 %
EOS (ABSOLUTE): 0.2 10*3/uL (ref 0.0–0.4)
EOS: 3 %
HEMOGLOBIN: 12.1 g/dL (ref 11.1–15.9)
Hematocrit: 37.7 % (ref 34.0–46.6)
Immature Grans (Abs): 0 10*3/uL (ref 0.0–0.1)
Immature Granulocytes: 0 %
LYMPHS ABS: 1.7 10*3/uL (ref 0.7–3.1)
Lymphs: 30 %
MCH: 29.4 pg (ref 26.6–33.0)
MCHC: 32.1 g/dL (ref 31.5–35.7)
MCV: 92 fL (ref 79–97)
MONOS ABS: 0.5 10*3/uL (ref 0.1–0.9)
Monocytes: 8 %
Neutrophils Absolute: 3.3 10*3/uL (ref 1.4–7.0)
Neutrophils: 59 %
Platelets: 178 10*3/uL (ref 150–379)
RBC: 4.11 x10E6/uL (ref 3.77–5.28)
RDW: 14.9 % (ref 12.3–15.4)
WBC: 5.7 10*3/uL (ref 3.4–10.8)

## 2017-10-07 LAB — LIPID PANEL
Chol/HDL Ratio: 5 ratio — ABNORMAL HIGH (ref 0.0–4.4)
Cholesterol, Total: 205 mg/dL — ABNORMAL HIGH (ref 100–199)
HDL: 41 mg/dL (ref >39)
LDL Calculated: 135 mg/dL — ABNORMAL HIGH (ref 0–99)
TRIGLYCERIDES: 146 mg/dL (ref 0–149)
VLDL CHOLESTEROL CAL: 29 mg/dL (ref 5–40)

## 2017-10-09 DIAGNOSIS — M25562 Pain in left knee: Secondary | ICD-10-CM | POA: Diagnosis not present

## 2017-10-09 DIAGNOSIS — M25561 Pain in right knee: Secondary | ICD-10-CM | POA: Diagnosis not present

## 2017-10-09 DIAGNOSIS — M170 Bilateral primary osteoarthritis of knee: Secondary | ICD-10-CM | POA: Diagnosis not present

## 2017-10-09 DIAGNOSIS — M5431 Sciatica, right side: Secondary | ICD-10-CM | POA: Diagnosis not present

## 2017-10-10 ENCOUNTER — Other Ambulatory Visit: Payer: Self-pay | Admitting: Neurology

## 2017-10-13 ENCOUNTER — Telehealth: Payer: Self-pay | Admitting: Adult Health

## 2017-10-13 NOTE — Telephone Encounter (Signed)
Pt calling re: her Botox, she states she rcvd a call from [place] re: her Botox needing a Prior Auth, Pt was advised that Andee Poles was not in office today.  Pt asked this message be left and that she will also try back on Monday morning to try and reach [place] re: what Carley Hammed is stating is needed

## 2017-10-16 ENCOUNTER — Other Ambulatory Visit: Payer: Self-pay | Admitting: Neurology

## 2017-10-16 NOTE — Telephone Encounter (Signed)
I spoke with patient this morning and told her I would obtain PA before her apt.

## 2017-10-16 NOTE — Telephone Encounter (Signed)
ERROR

## 2017-10-17 NOTE — Telephone Encounter (Signed)
Called pt to verify how she was taking gabapentin. She stated she takes gabapentin 300mg  (2 capsules twice daily).  She tried gabapentin 800mg  twice daily, but it "made her head feel funny". She was not able to tolerate. She advised Megan told her to go back to lower dose.

## 2017-10-18 DIAGNOSIS — D509 Iron deficiency anemia, unspecified: Secondary | ICD-10-CM | POA: Diagnosis not present

## 2017-10-18 DIAGNOSIS — D122 Benign neoplasm of ascending colon: Secondary | ICD-10-CM | POA: Diagnosis not present

## 2017-10-18 DIAGNOSIS — D123 Benign neoplasm of transverse colon: Secondary | ICD-10-CM | POA: Diagnosis not present

## 2017-10-18 DIAGNOSIS — K254 Chronic or unspecified gastric ulcer with hemorrhage: Secondary | ICD-10-CM | POA: Diagnosis not present

## 2017-10-18 DIAGNOSIS — D120 Benign neoplasm of cecum: Secondary | ICD-10-CM | POA: Diagnosis not present

## 2017-10-19 NOTE — Telephone Encounter (Signed)
Botox approved.  [id number] for dates: 10/19/2017-01/19/2018

## 2017-10-23 ENCOUNTER — Telehealth: Payer: Self-pay

## 2017-10-23 DIAGNOSIS — Z86010 Personal history of colonic polyps: Secondary | ICD-10-CM | POA: Insufficient documentation

## 2017-10-23 NOTE — Telephone Encounter (Signed)
I received an prior auth request for sumatriptan INJ. I have completed and submitted the PA and should have a determination within 48-72 hours.

## 2017-10-24 ENCOUNTER — Other Ambulatory Visit: Payer: Self-pay | Admitting: Family Medicine

## 2017-10-25 ENCOUNTER — Encounter: Payer: Self-pay | Admitting: Adult Health

## 2017-10-25 ENCOUNTER — Ambulatory Visit: Payer: Medicare Other | Admitting: Adult Health

## 2017-10-25 VITALS — BP 128/79 | HR 49

## 2017-10-25 DIAGNOSIS — G43719 Chronic migraine without aura, intractable, without status migrainosus: Secondary | ICD-10-CM | POA: Diagnosis not present

## 2017-10-25 NOTE — Progress Notes (Signed)
      BOTOX PROCEDURE NOTE FOR MIGRAINE HEADACHE    Contraindications and precautions discussed with patient(above). Aseptic procedure was observed and patient tolerated procedure. Procedure performed by Ward Givens NP  The condition has existed for more than 6 months, and pt does not have a diagnosis of ALS, Myasthenia Gravis or Lambert-Eaton Syndrome. Risks and benefits of injections discussed and pt agrees to proceed with the procedure. Written consent obtained  These injections are medically necessary. She has received good benefit. She now has approximately 3-4 headaches a week versus daily headaches. These injections do not cause sedations or hallucinations which the oral therapies may cause.  Indication/Diagnosis: chronic migraine BOTOX(J0585) injection was performed according to protocol by [employer]. 200 units of BOTOX was dissolved into 4 cc NS.  NDC: [address]-32  Type of toxin: Botox  Lot # [id number] EXP: 03/2020   Description of procedure:  The patient was placed in a sitting position. The standard protocol was used for Botox as follows, with 5 units of Botox injected at each site:   -Procerus muscle, midline injection  -Corrugator muscle, bilateral injection  -Frontalis muscle, bilateral injection, with 2 sites each side, medial injection was performed in the upper one third of the frontalis muscle, in the region vertical from the medial inferior edge of the superior orbital rim. The lateral injection was again in the upper one third of the forehead vertically above the lateral limbus of the cornea, 1.5 cm lateral to the medial injection site.  -Temporalis muscle injection, 4 sites, bilaterally. The first injection was 3 cm above the tragus of the ear, second injection site was 1.5 cm to 3 cm up from the first injection site in line with the tragus of the ear. The third injection site was 1.5-3 cm forward between the first 2 injection sites. The fourth injection  site was 1.5 cm posterior to the second injection site.  -Occipitalis muscle injection, 3 sites, bilaterally. The first injection was done one half way between the occipital protuberance and the tip of the mastoid process behind the ear. The second injection site was done lateral and superior to the first, 1 fingerbreadth from the first injection. The third injection site was 1 fingerbreadth superiorly and medially from the first injection site.  -Cervical paraspinal muscle injection, 2 sites, bilateral knee first injection site was 1 cm from the midline of the cervical spine, 3 cm inferior to the lower border of the occipital protuberance. The second injection site was 1.5 cm superiorly and laterally to the first injection site.  -Trapezius muscle injection was performed at 3 sites, bilaterally. The first injection site was in the upper trapezius muscle halfway between the inflection point of the neck, and the acromion. The second injection site was one half way between the acromion and the first injection site. The third injection was done between the first injection site and the inflection point of the neck.   Will return for repeat injection in 3 months.   A 200 unit sof Botox was used, 155 units were injected, the rest of the Botox was wasted. The patient tolerated the procedure well, there were no complications of the above procedure.    Ward Givens, MSN, NP-C 10/25/2017, 7:21 AM [facility] [address] [place], [place] [address] [phone]

## 2017-10-26 DIAGNOSIS — R109 Unspecified abdominal pain: Secondary | ICD-10-CM | POA: Diagnosis not present

## 2017-10-29 DIAGNOSIS — G4733 Obstructive sleep apnea (adult) (pediatric): Secondary | ICD-10-CM | POA: Diagnosis not present

## 2017-10-29 DIAGNOSIS — R0902 Hypoxemia: Secondary | ICD-10-CM | POA: Diagnosis not present

## 2017-11-06 ENCOUNTER — Ambulatory Visit (INDEPENDENT_AMBULATORY_CARE_PROVIDER_SITE_OTHER): Payer: Medicare Other | Admitting: *Deleted

## 2017-11-06 DIAGNOSIS — E538 Deficiency of other specified B group vitamins: Secondary | ICD-10-CM | POA: Diagnosis not present

## 2017-11-06 NOTE — Progress Notes (Signed)
Pt given Cyanocobalamin inj Tolerated well 

## 2017-11-15 DIAGNOSIS — M1710 Unilateral primary osteoarthritis, unspecified knee: Secondary | ICD-10-CM | POA: Diagnosis not present

## 2017-11-15 DIAGNOSIS — Z9884 Bariatric surgery status: Secondary | ICD-10-CM | POA: Diagnosis not present

## 2017-11-15 DIAGNOSIS — I10 Essential (primary) hypertension: Secondary | ICD-10-CM | POA: Diagnosis not present

## 2017-11-15 DIAGNOSIS — K219 Gastro-esophageal reflux disease without esophagitis: Secondary | ICD-10-CM | POA: Diagnosis not present

## 2017-11-24 ENCOUNTER — Other Ambulatory Visit: Payer: Self-pay | Admitting: Neurology

## 2017-11-28 DIAGNOSIS — R0902 Hypoxemia: Secondary | ICD-10-CM | POA: Diagnosis not present

## 2017-11-28 DIAGNOSIS — G4733 Obstructive sleep apnea (adult) (pediatric): Secondary | ICD-10-CM | POA: Diagnosis not present

## 2017-12-07 ENCOUNTER — Ambulatory Visit (INDEPENDENT_AMBULATORY_CARE_PROVIDER_SITE_OTHER): Payer: Medicare Other

## 2017-12-07 DIAGNOSIS — E538 Deficiency of other specified B group vitamins: Secondary | ICD-10-CM | POA: Diagnosis not present

## 2017-12-07 NOTE — Progress Notes (Signed)
B-12 injection given to left deltoid, patient tolerated well 

## 2017-12-19 ENCOUNTER — Encounter: Payer: Self-pay | Admitting: Neurology

## 2017-12-19 ENCOUNTER — Ambulatory Visit: Payer: Medicare Other | Admitting: Neurology

## 2017-12-19 VITALS — BP 132/76 | HR 50 | Ht 64.0 in | Wt 156.6 lb

## 2017-12-19 DIAGNOSIS — G43719 Chronic migraine without aura, intractable, without status migrainosus: Secondary | ICD-10-CM

## 2017-12-19 MED ORDER — DIVALPROEX SODIUM 250 MG PO DR TAB: DELAYED_RELEASE_TABLET | ORAL | 3 refills | Status: DC

## 2017-12-19 NOTE — Progress Notes (Signed)
Reason for visit: Intractable migraine headache  Morgan [name] is an 52 y.o. female  History of present illness:  Morgan [name] is a 52 year old right-handed white female with a history of bipolar disorder and chronic migraine headache.  The patient has in the past again benefit from Botox injections but these were too expensive for her.  She has been able to restart the injections through this office at minimal cost to her.  The patient has had 2 injections, the last injection was in November 2018.  Unfortunately, the patient believes that the headaches have worsened on Botox rather than improved.  She is now having headaches every day, she is incapacitated by the headaches about 15 days of the month.  Headaches last all day long.  There may be associated with nausea without vomiting.  The patient does believe she gains benefit from gabapentin taking 600 mg twice daily, higher doses increase her headache.  The patient is using injectable Imitrex up to 4 times a week, currently she believes that the Imitrex makes her headaches worse rather than better.  She remains on tizanidine and propranolol, these medications have not offered much benefit.  She does indicate that she has had some problems with peptic ulcer disease, she is on Protonix and sucralfate.  Past Medical History:  Diagnosis Date  . Allergic rhinitis   . Barrett's esophagus   . Bipolar affective disorder ([place])   . Chronic respiratory failure ([place])   . Constipation, chronic   . Diabetes mellitus without complication ([place])    type 2   . Dry eye   . Gastroparesis   . GERD (gastroesophageal reflux disease)   . History of bariatric surgery 03/2017  . Hyperlipidemia   . Hypertension   . Intractable chronic migraine without aura 06/04/2015  . Migraine headache   . Morbid obesity ([place])   . OSA (obstructive sleep apnea)    uses a cpap with 2 units o2   . Osteoarthritis    bilateral knee  . Sleep apnea    has c-pap  .  Unspecified hypothyroidism   . Vitamin B 12 deficiency   . Vitamin D deficiency     Past Surgical History:  Procedure Laterality Date  . APPENDECTOMY  1978  . CHOLECYSTECTOMY  2005  . GASTRIC ROUX-EN-Y N/A 03/21/2017   Procedure: LAPAROSCOPIC ROUX-EN-Y GASTRIC, UPPER ENDO;  Surgeon: Greer Pickerel, MD;  Location: [facility];  Service: General;  Laterality: N/A;  . SHOULDER ARTHROSCOPY  7/12   left-dsc  . TONSILLECTOMY  at age 53  . TRIGGER FINGER RELEASE  12/20/2012   Procedure: RELEASE TRIGGER FINGER/A-1 PULLEY;  Surgeon: Tennis Must, MD;  Location: [facility];  Service: Orthopedics;  Laterality: Left;  LEFT TRIGGER THUMB RELEASE    Family History  Problem Relation Age of Onset  . Asthma Father   . Allergies Father   . Heart disease Father        enlarged heart  . Peripheral vascular disease Father   . Diabetes Father   . Hyperlipidemia Father   . Arthritis Father   . Asthma Sister   . Cancer Sister        colon at 7 yr old.  . Colon cancer Sister   . Allergies Mother   . Stroke Mother 80       with hemi paralysis  . Diabetes Mother   . Hyperlipidemia Mother   . Hypertension Mother   . GI problems Mother   .  Arthritis Mother   . Allergies Brother   . Early death Brother 9       congenital abormality  . Allergies Sister   . Diabetes Sister   . Asthma Sister   . Colon polyps Sister   . Hyperlipidemia Sister   . GI problems Sister        gastroporesis   . Liver disease Sister        fatty liver  . Stroke Sister        intercrandial bleed  . Diabetes Brother   . Hypertension Brother   . Hyperlipidemia Brother   . Diabetes Daughter   . Hyperlipidemia Daughter   . Hypertension Daughter   . Heart disease Paternal Aunt   . Migraines Neg Hx     Social history:  reports that  has never smoked. she has never used smokeless tobacco. She reports that she does not drink alcohol or use drugs.    Allergies  Allergen Reactions  . Ketoconazole     REACTION:  hives, swelling  . Pravachol [Pravastatin Sodium] Shortness Of Breath, Swelling and Anaphylaxis    Throat swelling  . Tape Other (See Comments)  . Codeine     REACTION: increased BP  . Statins     REACTION: leg crapms    Medications:  Prior to Admission medications   Medication Sig Start Date End Date Taking? Authorizing Provider  calcium-vitamin D (OSCAL WITH D) 500-200 MG-UNIT tablet Take 1 tablet by mouth 3 (three) times daily.   Yes [provider]  Cholecalciferol (VITAMIN D) 2000 UNITS CAPS Take 2,000 Units by mouth daily.   Yes [provider]  dexlansoprazole (DEXILANT) 60 MG capsule Take 1 capsule (60 mg total) by mouth daily. 07/06/17  Yes Timmothy Euler, MD  diclofenac sodium (VOLTAREN) 1 % GEL Apply 2 g topically 2 (two) times daily as needed (JOINT PAIN). Use as directed for knee pain 10/02/15  Yes [provider]  ezetimibe (ZETIA) 10 MG tablet Take 1 tablet (10 mg total) by mouth daily. 08/01/17  Yes Timmothy Euler, MD  fluticasone (FLONASE) 50 MCG/ACT nasal spray SPRAY 1 SPRAY IN EACH NOSTRIL TWICE DAILY. SHAKE GENTLY BEFORE EACH [place] E. 08/01/17  Yes Timmothy Euler, MD  gabapentin (NEURONTIN) 300 MG capsule Take 2 capsules (600 mg total) 2 (two) times daily by mouth. 10/18/17  Yes Ward Givens, NP  levothyroxine (SYNTHROID, LEVOTHROID) 150 MCG tablet TAKE 1 TABLET DAILY 08/01/17  Yes Timmothy Euler, MD  linaclotide [facility]) 145 MCG CAPS capsule Take 1 capsule (145 mcg total) by mouth daily. 10/06/17  Yes Timmothy Euler, MD  loratadine (CLARITIN) 10 MG tablet Take 10 mg by mouth daily as needed for allergies. Prn     Yes [provider]  Multiple Vitamin (MULTIVITAMIN) capsule Take 2 capsules by mouth daily.    Yes [provider]  nystatin [facility]) powder APPLY TOPICALLY TWICE A DAY AS NEEDED 07/06/17  Yes Timmothy Euler, MD  ONE TOUCH ULTRA TEST test strip CHECK BLOOD SUGAR TWICE A DAY AS DIRECTED 10/24/17   Yes Timmothy Euler, MD  OXYGEN Inhale 2 L into the lungs Nightly.   Yes [provider]  PAZEO 0.7 % SOLN Place 1 drop into both eyes every morning. 06/10/16  Yes [provider]  Probiotic Product (HEALTHY COLON PO) Take 2 capsules by mouth daily.    Yes [provider]  propranolol (INDERAL) 40 MG tablet TAKE (1) TABLET TWICE A  DAY. 06/30/17  Yes Kathrynn Ducking, MD  RESTASIS MULTIDOSE 0.05 % ophthalmic emulsion Place 1 drop into both eyes 2 (two) times daily. 06/17/16  Yes [provider]  sertraline (ZOLOFT) 100 MG tablet TAKE (2) TABLETS DAILY 07/06/17  Yes Timmothy Euler, MD  sucralfate (CARAFATE) 1 g tablet Take by mouth.   Yes [provider]  SUMAtriptan 6 MG/0.5ML SOAJ Inject 0.5 mLs in skin every 2 hours as needed for migraine or headache. 10/10/17  Yes Kathrynn Ducking, MD  tiZANidine (ZANAFLEX) 2 MG tablet Take 1 tablet (2 mg total) by mouth 3 (three) times daily. 11/27/17  Yes Kathrynn Ducking, MD  traMADol (ULTRAM) 50 MG tablet Take 1 tablet by mouth every 6 (six) hours as needed.   Yes [provider]  ziprasidone (GEODON) 60 MG capsule TAKE 1 CAPSULE IN THE EVENING 07/06/17  Yes Timmothy Euler, MD    ROS:  Out of a complete 14 system review of symptoms, the patient complains only of the following symptoms, and all other reviewed systems are negative.  Light sensitivity, blurred vision Constipation, nausea Sleep apnea Environmental allergies Joint pain, back pain, neck pain, neck stiffness Dizziness, headache Depression, anxiety  Blood pressure 132/76, pulse (!) 50, height 5\' 4"  (1.626 m), weight 156 lb 9.6 oz (71 kg).  Physical Exam  General: The patient is alert and cooperative at the time of the examination.  Skin: No significant peripheral edema is noted.   Neurologic Exam  Mental status: The patient is alert and oriented x 3 at the time of the examination. The patient has apparent normal recent  and remote memory, with an apparently normal attention span and concentration ability.   Cranial nerves: Facial symmetry is present. Speech is normal, no aphasia or dysarthria is noted. Extraocular movements are full. Visual fields are full.  Motor: The patient has good strength in all 4 extremities.  Sensory examination: Soft touch sensation is symmetric on the face, arms, and legs.  Coordination: The patient has good finger-nose-finger and heel-to-shin bilaterally.  Gait and station: The patient has a normal gait. Tandem gait is normal. Romberg is negative. No drift is seen.  Reflexes: Deep tendon reflexes are symmetric.   Assessment/Plan:  1.  Intractable migraine headache  The patient unfortunately is not gaining benefit with Botox.  The patient will try one more injection, if this is not effective, we will consider switching over to [place].  The patient will be tapered off of propranolol and started on Depakote, she will watch out for any stomach upset.  The patient will remain on gabapentin for now.  The patient will follow-up in about 4 months.  Jill Alexanders MD 12/19/2017 7:32 AM  [facility] [address] [place],  [address]  Phone [phone] Fax [phone]

## 2017-12-19 NOTE — Patient Instructions (Signed)
    We will reduce the propranolol 40 mg to 1/2 tablet twice a day for 2 weeks, then stop.  We will start Depakote 250 mg twice a day for 2 weeks, then take 2 tablets twice a day.  Depakote (valproic acid) is a seizure medication that also has an FDA approval for migraine headache. The most common potential side effects of this medication include weight gain, tremor, or possible stomach upset. This medication can potentially cause liver problems. If confusion is noted on this medication, contact our office immediately.

## 2017-12-20 DIAGNOSIS — Z9884 Bariatric surgery status: Secondary | ICD-10-CM | POA: Diagnosis not present

## 2017-12-20 DIAGNOSIS — K289 Gastrojejunal ulcer, unspecified as acute or chronic, without hemorrhage or perforation: Secondary | ICD-10-CM | POA: Diagnosis not present

## 2017-12-20 DIAGNOSIS — R1013 Epigastric pain: Secondary | ICD-10-CM | POA: Diagnosis not present

## 2017-12-28 DIAGNOSIS — K5900 Constipation, unspecified: Secondary | ICD-10-CM | POA: Diagnosis not present

## 2017-12-28 DIAGNOSIS — K229 Disease of esophagus, unspecified: Secondary | ICD-10-CM | POA: Diagnosis not present

## 2017-12-28 DIAGNOSIS — Z9884 Bariatric surgery status: Secondary | ICD-10-CM | POA: Diagnosis not present

## 2017-12-29 DIAGNOSIS — G4733 Obstructive sleep apnea (adult) (pediatric): Secondary | ICD-10-CM | POA: Diagnosis not present

## 2017-12-29 DIAGNOSIS — R0902 Hypoxemia: Secondary | ICD-10-CM | POA: Diagnosis not present

## 2018-01-03 ENCOUNTER — Telehealth: Payer: Self-pay | Admitting: Neurology

## 2018-01-03 MED ORDER — DIVALPROEX SODIUM 500 MG PO DR TAB: DELAYED_RELEASE_TABLET | ORAL | 3 refills | Status: DC

## 2018-01-03 NOTE — Telephone Encounter (Signed)
Patient has been on divalproex (DEPAKOTE) 250 MG DR tablet since 12-19-17. It does not seem to be helping with headaches and her head is pounding every day. Does she need to increase the dosage or try something else? Please call and advise. Patient uses [employer].

## 2018-01-03 NOTE — Addendum Note (Signed)
Addended by: Kathrynn Ducking on: 01/03/2018 10:58 AM   Modules accepted: Orders

## 2018-01-03 NOTE — Telephone Encounter (Signed)
I called the patient.  The patient is on Depakote taking 500 mg twice daily, she is still having headaches daily.  We will go up on the Depakote taking 500 mg in the morning and 1000 mg in the evening.  If this is not effective, we will add Aimovig or Ajovy to the regimen.

## 2018-01-04 ENCOUNTER — Ambulatory Visit (INDEPENDENT_AMBULATORY_CARE_PROVIDER_SITE_OTHER): Payer: Medicare Other | Admitting: *Deleted

## 2018-01-04 DIAGNOSIS — E538 Deficiency of other specified B group vitamins: Secondary | ICD-10-CM | POA: Diagnosis not present

## 2018-01-04 DIAGNOSIS — Z23 Encounter for immunization: Secondary | ICD-10-CM

## 2018-01-04 DIAGNOSIS — Z0000 Encounter for general adult medical examination without abnormal findings: Secondary | ICD-10-CM

## 2018-01-08 ENCOUNTER — Ambulatory Visit (INDEPENDENT_AMBULATORY_CARE_PROVIDER_SITE_OTHER): Payer: Medicare Other | Admitting: Family Medicine

## 2018-01-08 ENCOUNTER — Encounter: Payer: Self-pay | Admitting: Family Medicine

## 2018-01-08 VITALS — BP 129/79 | HR 67 | Temp 97.9°F | Ht 64.0 in | Wt 150.8 lb

## 2018-01-08 DIAGNOSIS — M5416 Radiculopathy, lumbar region: Secondary | ICD-10-CM

## 2018-01-08 DIAGNOSIS — I10 Essential (primary) hypertension: Secondary | ICD-10-CM | POA: Diagnosis not present

## 2018-01-08 DIAGNOSIS — E119 Type 2 diabetes mellitus without complications: Secondary | ICD-10-CM | POA: Diagnosis not present

## 2018-01-08 LAB — CMP14+EGFR
ALBUMIN: 3.9 g/dL (ref 3.5–5.5)
ALK PHOS: 62 IU/L (ref 39–117)
ALT: 19 IU/L (ref 0–32)
AST: 22 IU/L (ref 0–40)
Albumin/Globulin Ratio: 1.8 (ref 1.2–2.2)
BILIRUBIN TOTAL: 0.3 mg/dL (ref 0.0–1.2)
BUN / CREAT RATIO: 29 — AB (ref 9–23)
BUN: 17 mg/dL (ref 6–24)
CHLORIDE: 102 mmol/L (ref 96–106)
CO2: 26 mmol/L (ref 20–29)
Calcium: 9.4 mg/dL (ref 8.7–10.2)
Creatinine, Ser: 0.59 mg/dL (ref 0.57–1.00)
GFR calc Af Amer: 124 mL/min/{1.73_m2} (ref >59)
GFR calc non Af Amer: 107 mL/min/{1.73_m2} (ref >59)
GLOBULIN, TOTAL: 2.2 g/dL (ref 1.5–4.5)
GLUCOSE: 65 mg/dL (ref 65–99)
Potassium: 4 mmol/L (ref 3.5–5.2)
SODIUM: 147 mmol/L — AB (ref 134–144)
Total Protein: 6.1 g/dL (ref 6.0–8.5)

## 2018-01-08 LAB — CBC WITH DIFFERENTIAL/PLATELET
BASOS: 0 %
Basophils Absolute: 0 10*3/uL (ref 0.0–0.2)
EOS (ABSOLUTE): 0.1 10*3/uL (ref 0.0–0.4)
Eos: 4 %
Hematocrit: 38.6 % (ref 34.0–46.6)
Hemoglobin: 12.4 g/dL (ref 11.1–15.9)
IMMATURE GRANS (ABS): 0 10*3/uL (ref 0.0–0.1)
Immature Granulocytes: 0 %
Lymphocytes Absolute: 0.9 10*3/uL (ref 0.7–3.1)
Lymphs: 40 %
MCH: 29.4 pg (ref 26.6–33.0)
MCHC: 32.1 g/dL (ref 31.5–35.7)
MCV: 92 fL (ref 79–97)
MONOS ABS: 0.2 10*3/uL (ref 0.1–0.9)
Monocytes: 10 %
NEUTROS ABS: 1.1 10*3/uL — AB (ref 1.4–7.0)
Neutrophils: 46 %
PLATELETS: 162 10*3/uL (ref 150–379)
RBC: 4.22 x10E6/uL (ref 3.77–5.28)
RDW: 13.8 % (ref 12.3–15.4)
WBC: 2.3 10*3/uL — CL (ref 3.4–10.8)

## 2018-01-08 LAB — BAYER DCA HB A1C WAIVED: HB A1C: 5.2 % (ref <7.0)

## 2018-01-08 NOTE — Progress Notes (Signed)
   HPI  Patient presents today here for follow-up chronic medical conditions.  Patient has had some back pain over the last several months, she states that it previously improved with a course of steroids, however she has had an interval development of peptic ulcer disease after her gastric bypass.  She understands that prednisone may cause worsening of her ulcer. She states that she has right-sided low back pain with radiation down her right lateral leg. At times her knee gives away. She does have follow-up with orthopedics scheduled.  Hypertension Blood pressure at home is ranging 120s-150s, however it is mostly in the 169C systolic. No headaches or chest pain.  80s History of diabetes, now status post gastric bypass and doing very well.  Blood sugars at home have been ranging 80-100  PMH: Smoking status noted ROS: Per HPI  Objective: BP 129/79   Pulse 67   Temp 97.9 F (36.6 C) (Oral)   Ht 5' 4" (1.626 m)   Wt 150 lb 12.8 oz (68.4 kg)   BMI 25.88 kg/m  Gen: NAD, alert, cooperative with exam HEENT: NCAT CV: RRR, good S1/S2, no murmur Resp: CTABL, no wheezes, non-labored Ext: No edema, warm Neuro: Alert and oriented, No gross deficits MSK Some tenderness to palpation over the right SI joint, however no midline tenderness and no paraspinal muscle tenderness in the lumbar area Negative straight leg raise bilaterally Significant crepitus in the left knee  Assessment and plan:  #Type 2 diabetes Now diet controlled status post gastric bypass Repeat A1c today   #Hypertension Well-controlled with diet only female status post gastric bypass No changes Labs    #Lumbar radiculopathy No red flags Patient doing well with the pain, offered plain film as she did have worsening after slipping on the ice, however she declines She will follow-up with orthopedics   Interval changes-patient has been started on Depakote for migraine headaches by neurology.    Orders Placed  This Encounter  Procedures  . CBC with Differential/Platelet  . CMP14+EGFR  . [facility] Hb A1c Carney Bern, MD [facility] 01/08/2018, 8:57 AM

## 2018-01-09 ENCOUNTER — Other Ambulatory Visit: Payer: Self-pay

## 2018-01-09 DIAGNOSIS — D72819 Decreased white blood cell count, unspecified: Secondary | ICD-10-CM

## 2018-01-11 ENCOUNTER — Other Ambulatory Visit: Payer: Medicare Other

## 2018-01-11 DIAGNOSIS — D72819 Decreased white blood cell count, unspecified: Secondary | ICD-10-CM

## 2018-01-12 ENCOUNTER — Telehealth: Payer: Self-pay | Admitting: Family Medicine

## 2018-01-12 LAB — CBC WITH DIFFERENTIAL/PLATELET
BASOS: 1 %
Basophils Absolute: 0 10*3/uL (ref 0.0–0.2)
EOS (ABSOLUTE): 0.1 10*3/uL (ref 0.0–0.4)
EOS: 2 %
HEMATOCRIT: 40.6 % (ref 34.0–46.6)
Hemoglobin: 12.6 g/dL (ref 11.1–15.9)
Immature Grans (Abs): 0 10*3/uL (ref 0.0–0.1)
Immature Granulocytes: 0 %
LYMPHS ABS: 1.3 10*3/uL (ref 0.7–3.1)
Lymphs: 39 %
MCH: 28.8 pg (ref 26.6–33.0)
MCHC: 31 g/dL — AB (ref 31.5–35.7)
MCV: 93 fL (ref 79–97)
MONOS ABS: 0.3 10*3/uL (ref 0.1–0.9)
Monocytes: 10 %
Neutrophils Absolute: 1.6 10*3/uL (ref 1.4–7.0)
Neutrophils: 48 %
Platelets: 174 10*3/uL (ref 150–379)
RBC: 4.38 x10E6/uL (ref 3.77–5.28)
RDW: 13.8 % (ref 12.3–15.4)
WBC: 3.3 10*3/uL — ABNORMAL LOW (ref 3.4–10.8)

## 2018-01-12 NOTE — Telephone Encounter (Signed)
Pt called with labs

## 2018-01-16 DIAGNOSIS — R1013 Epigastric pain: Secondary | ICD-10-CM | POA: Diagnosis not present

## 2018-01-17 MED ORDER — PREDNISONE 5 MG PO TABS: ORAL_TABLET | ORAL | 0 refills | Status: DC

## 2018-01-17 NOTE — Addendum Note (Signed)
Addended by: Kathrynn Ducking on: 01/17/2018 03:19 PM   Modules accepted: Orders

## 2018-01-17 NOTE — Telephone Encounter (Signed)
Pt has called to inform that the increase in her medication has not helped.  Her head still pounds and she can hear it in her ears.  Please call

## 2018-01-17 NOTE — Telephone Encounter (Signed)
I called the patient.  She believes that going up on the Depakote made her headaches worse, she will drop back to 500 mg twice daily.  I will give her a 5 mg 6-day course of prednisone, she will be coming in on 20 February for her Botox injection.  In the future Aimovig will be tried if the Botox does not help.

## 2018-01-22 ENCOUNTER — Encounter: Payer: Self-pay | Admitting: Skilled Nursing Facility1

## 2018-01-22 ENCOUNTER — Encounter: Payer: Medicare Other | Attending: General Surgery | Admitting: Skilled Nursing Facility1

## 2018-01-22 DIAGNOSIS — Z713 Dietary counseling and surveillance: Secondary | ICD-10-CM | POA: Insufficient documentation

## 2018-01-22 DIAGNOSIS — E669 Obesity, unspecified: Secondary | ICD-10-CM | POA: Insufficient documentation

## 2018-01-22 DIAGNOSIS — Z9884 Bariatric surgery status: Secondary | ICD-10-CM | POA: Insufficient documentation

## 2018-01-22 DIAGNOSIS — E119 Type 2 diabetes mellitus without complications: Secondary | ICD-10-CM

## 2018-01-22 NOTE — Progress Notes (Signed)
RYGB  Pt states she thinks she is having a lot of gas stuck up inside of her.  Pt arrives wearing a face mask stating her white blood cell count is down. Pt states she has been migraine headaches every day since after the surgery. Pt states she is currently going through menopause. Pt states her hair is getting really thin: losing clumps at a time. Pt states she did have an ulcer in her pouch. Pt states her ulcer is healed but have been having soarness and pain in her stomach. Pt states she is getting an ultra sound tomorrow. Pt states she has also had a CT scan. Pt states she is scared she is losing too much weight. Pt states she does get shaky sometimes. Pt states she does not have an appetite, she will eat something get pain and not want to eat anything the rest of the day. Pts A1C was at 4.8 about 3 months ago and her WBC 3.3 up from 2.3. Pt states she has been sipping on fluid throughout the day. Pt states her hip has been hurting her with numbness and tingling down through her leg and a sharp pain in her lower back/hip. Pt states she did not think the kefir helped her bowels.  Pt is having low blood sugar.   Surgery date: 03/21/2017 Surgery type: RYGB [facility] weight at [facility]: 312.5 Weight today: 148.6 Weight Change: 36.4  [place]AN[place]  BODY COMP RESULTS  04/04/2017 05/16/2017 06/15/2017 09/18/2017 01/22/2018   BMI (kg/m^2) 42.7 40.0 37.8 31.8 25.5   Fat Mass (lbs) 118.2 110.4 100.2 65.4 44.2   Fat Free Mass (lbs) 130.6 122.8 120.2 119.6 104.4   Total Body Water (lbs) 95.6 89.4 87 85 72.8   24-hr recall: B (AM): 2 boiled eggs with 1 or 2 slices bacon (61W) or oatmeal with 1 piece of toast Snk (AM): peanut butter or crackers and peanut butter  L (PM): 4 ounces of tuna (28g)---chicken and vegetables (beans or carrots) or venison hamburger with beans and some times baked potato or hotdog  Snk (PM): peach or yogurt: dannon  D (PM): chicken and vegetables (28g) or hamburger patty with beans or broccoli   Snk (PM):   Fluid intake: water, unsweet tea: Aiming for 40-50 fluid ounces  Estimated total protein intake: 70  Medications: taking Carafate and protonix  Supplementation: [employer] and calcium   CBG monitoring: 2 times a day Average CBG per patient: under 43,15,400 Last patient reported A1c: 5.2  Using straws: no Drinking while eating: no Having you been chewing well: yes Chewing/swallowing difficulties: no Changes in vision: no Changes to mood/headaches: no Hair loss/[place] to skin/Changes to nails: no Any difficulty focusing or concentrating: no Sweating: no Dizziness/Lightheaded: no Palpitations: no  Carbonated beverages: no N/V/D/C/GAS:  Constipation: taking linzess and miralax  Abdominal Pain: no Dumping syndrome: no  Recent physical activity:  Water aerobics 3 days week 60 minutes (not since her lowered immune system) walking in the house  Progress Towards Goal(s):  In progress.  Handouts given during visit include:  NS veggies + protein   Nutritional Diagnosis:  [place]-3.3 Overweight/obesity related to past poor dietary habits and physical inactivity as evidenced by patient w/ recent RYGB surgery following dietary guidelines for continued weight loss.    Intervention:  Nutrition counseling. Dietitian educated the pt on advancing her diet to include ns veggies while having enough room to add more fluid into her day. Goals: -Have whole to half protein shake for breakfast and half  banana within 1 hour of waking 3 hours later after breakfast: 1 cheese stick 3 hours later for lunch: canned tuna leave little still in the container with 1/4 cup of beans with 1 tsp of mayo 3 hours later: 1 belvita sandwich with 1 tsp peanut butter smeared on top  3 hours later for dinner:  canned tuna leave little still in the container with 1/4 cup of beans with 1 tsp of mayo -Try soy crumbles and edamame  -Try alkaline water, pH 8.8 Teaching Method Utilized:   Visual Auditory Hands on  Barriers to learning/adherence to lifestyle change: lack of internal cues for hydration/blood sugars  Demonstrated degree of understanding via:  Teach Back   Monitoring/Evaluation:  Dietary intake, exercise, and body weight.

## 2018-01-22 NOTE — Patient Instructions (Addendum)
-  Have whole to half protein shake for breakfast and half banana within 1 hour of waking  3 hours later after breakfast: 1 cheese stick  3 hours later for lunch: canned tuna leave little still in the container with 1/4 cup of beans with 1 tsp of mayo  3 hours later: 1 belvita sandwich with 1 tsp peanut butter smeared on top   3 hours later for dinner:  canned tuna leave little still in the container with 1/4 cup of beans with 1 tsp of mayo  -Try soy crumbles and edamame   -Try alkaline water, pH 8.8  -Check your blood sugar 2 times a day: the morning and 2 hours after a meal: bring your log to your next appointment

## 2018-01-23 DIAGNOSIS — R1033 Periumbilical pain: Secondary | ICD-10-CM | POA: Diagnosis not present

## 2018-01-24 DIAGNOSIS — R1013 Epigastric pain: Secondary | ICD-10-CM | POA: Diagnosis not present

## 2018-01-24 DIAGNOSIS — Z9884 Bariatric surgery status: Secondary | ICD-10-CM | POA: Diagnosis not present

## 2018-01-24 DIAGNOSIS — M1710 Unilateral primary osteoarthritis, unspecified knee: Secondary | ICD-10-CM | POA: Diagnosis not present

## 2018-01-24 DIAGNOSIS — K5909 Other constipation: Secondary | ICD-10-CM | POA: Diagnosis not present

## 2018-01-25 ENCOUNTER — Other Ambulatory Visit: Payer: Self-pay | Admitting: General Surgery

## 2018-01-25 ENCOUNTER — Other Ambulatory Visit: Payer: Medicare Other

## 2018-01-25 DIAGNOSIS — D72818 Other decreased white blood cell count: Secondary | ICD-10-CM

## 2018-01-25 DIAGNOSIS — R1013 Epigastric pain: Secondary | ICD-10-CM

## 2018-01-25 LAB — CBC WITH DIFFERENTIAL/PLATELET
BASOS: 1 %
Basophils Absolute: 0 10*3/uL (ref 0.0–0.2)
EOS (ABSOLUTE): 0.1 10*3/uL (ref 0.0–0.4)
EOS: 2 %
HEMATOCRIT: 39.6 % (ref 34.0–46.6)
Hemoglobin: 12.9 g/dL (ref 11.1–15.9)
Immature Grans (Abs): 0 10*3/uL (ref 0.0–0.1)
Immature Granulocytes: 0 %
LYMPHS ABS: 1.4 10*3/uL (ref 0.7–3.1)
Lymphs: 32 %
MCH: 30 pg (ref 26.6–33.0)
MCHC: 32.6 g/dL (ref 31.5–35.7)
MCV: 92 fL (ref 79–97)
MONOS ABS: 0.4 10*3/uL (ref 0.1–0.9)
Monocytes: 9 %
Neutrophils Absolute: 2.4 10*3/uL (ref 1.4–7.0)
Neutrophils: 56 %
Platelets: 181 10*3/uL (ref 150–379)
RBC: 4.3 x10E6/uL (ref 3.77–5.28)
RDW: 14.1 % (ref 12.3–15.4)
WBC: 4.3 10*3/uL (ref 3.4–10.8)

## 2018-01-29 ENCOUNTER — Encounter: Payer: Medicare Other | Admitting: Skilled Nursing Facility1

## 2018-01-29 ENCOUNTER — Encounter: Payer: Self-pay | Admitting: Skilled Nursing Facility1

## 2018-01-29 DIAGNOSIS — R0902 Hypoxemia: Secondary | ICD-10-CM | POA: Diagnosis not present

## 2018-01-29 DIAGNOSIS — E119 Type 2 diabetes mellitus without complications: Secondary | ICD-10-CM

## 2018-01-29 DIAGNOSIS — G4733 Obstructive sleep apnea (adult) (pediatric): Secondary | ICD-10-CM | POA: Diagnosis not present

## 2018-01-29 DIAGNOSIS — Z713 Dietary counseling and surveillance: Secondary | ICD-10-CM | POA: Diagnosis not present

## 2018-01-29 DIAGNOSIS — Z9884 Bariatric surgery status: Secondary | ICD-10-CM | POA: Diagnosis not present

## 2018-01-29 NOTE — Progress Notes (Signed)
RYGB Pt states she will get an upper GI this Thursday. Pt states she starts eating after 1 bite and feels full or it starts hurting: sharp pain. Pts blood sugars fasting: 69, 79, 78, 80, 91, 70, 77, 84 and 2 hours after eating: 90, 99, 81, 80, 93, 79, 120. Pt states hours after eating she will feel clammy and tingly in her arms at the same time experiencing a migraine. Pt states she felt the alkaline water helped some. Pt states she was able to eat every 3 hours but it was forced and it caused a lot of sharp pains and felt like her food was not digesting at all throughout the day. Pt states she does have a hx of gastroparesis.   Pt states she does not believe it is carbohydrates causing her issues.  Surgery date: 03/21/2017 Surgery type: RYGB Start weight at [facility]: 312.5 Weight today: 148.6 Weight Change: 36.4  TANITA  BODY COMP RESULTS  04/04/2017 05/16/2017 06/15/2017 09/18/2017 01/22/2018   BMI (kg/m^2) 42.7 40.0 37.8 31.8 25.5   Fat Mass (lbs) 118.2 110.4 100.2 65.4 44.2   Fat Free Mass (lbs) 130.6 122.8 120.2 119.6 104.4   Total Body Water (lbs) 95.6 89.4 87 85 72.8   24-hr recall: B (AM): 2 boiled eggs with 1 or 2 slices bacon (28U) or oatmeal with 1 piece of toast Snk (AM): peanut butter or crackers and peanut butter  L (PM): 4 ounces of tuna (28g)---chicken and vegetables (beans or carrots) or venison hamburger with beans and some times baked potato or hotdog  Snk (PM): peach or yogurt: dannon  D (PM): chicken and vegetables (28g) or hamburger patty with beans or broccoli  Snk (PM):   Fluid intake: water, unsweet tea: Aiming for 40-50 fluid ounces  Estimated total protein intake: 70  Medications: taking Carafate and protonix  Supplementation: procare health capsule and calcium   CBG monitoring: 2 times a day Average CBG per patient: under 13,24,401 Last patient reported A1c: 5.2  Using straws: no Drinking while eating: no Having you been chewing well:  yes Chewing/swallowing difficulties: no Changes in vision: no Changes to mood/headaches: no Hair loss/Cahnges to skin/Changes to nails: no Any difficulty focusing or concentrating: no Sweating: no Dizziness/Lightheaded: no Palpitations: no  Carbonated beverages: no N/V/D/C/GAS:  Constipation: taking linzess and miralax  Abdominal Pain: no Dumping syndrome: possible  Recent physical activity:  Water aerobics 3 days week 60 minutes (not since her lowered immune system) walking in the house  Progress Towards Goal(s):  In progress.  Handouts given during visit include:  NS veggies + protein   Nutritional Diagnosis:  [place]-3.3 Overweight/obesity related to past poor dietary habits and physical inactivity as evidenced by patient w/ recent RYGB surgery following dietary guidelines for continued weight loss.    Intervention:  Nutrition counseling. Dietitian educated the pt on advancing her diet to include ns veggies while having enough room to add more fluid into her day. Goals: -Only add butter to one food item per meal -go back to eating every 4-5 hours  -Protein shake for breakfast -Try to have 3 ounces of tuna with mustard for lunch -Spray flounder with pam and lemon juice and wrap in tin foil and back: aiming for 3 ounces  -Try soy crumbles and edamame  Teaching Method Utilized:  Visual Auditory Hands on  Barriers to learning/adherence to lifestyle change: lack of internal cues for hydration/blood sugars  Demonstrated degree of understanding via:  Teach Back   Monitoring/Evaluation:  Dietary  intake, exercise, and body weight.

## 2018-01-29 NOTE — Patient Instructions (Addendum)
-  Only add butter to one food item per meal  -go back to eating every 4-5 hours   -Protein shake for breakfast -Try to have 3 ounces of tuna with mustard for lunch -Spray flounder with pam and lemon juice and wrap in tin foil and back: aiming for 3 ounces   -Try the soy crumbles

## 2018-01-30 ENCOUNTER — Encounter: Payer: Self-pay | Admitting: Internal Medicine

## 2018-01-30 ENCOUNTER — Ambulatory Visit: Payer: Medicare Other | Admitting: Internal Medicine

## 2018-01-30 VITALS — BP 116/68 | HR 71 | Ht 64.0 in | Wt 146.8 lb

## 2018-01-30 DIAGNOSIS — J9611 Chronic respiratory failure with hypoxia: Secondary | ICD-10-CM | POA: Diagnosis not present

## 2018-01-30 DIAGNOSIS — G4733 Obstructive sleep apnea (adult) (pediatric): Secondary | ICD-10-CM | POA: Diagnosis not present

## 2018-01-30 NOTE — Patient Instructions (Signed)
Order- DME [facility]    Please replace old CPAP machine- change to autopap 5-20, mask of choice, humidifier, supplies, [facility]     Dx OSA  Please call us as needed

## 2018-01-30 NOTE — Progress Notes (Signed)
HPI female never smoker followed for OSA, complicated by obesity, HBP, GERD/Barrett's, chronic respiratory failure with hypoxia, bipolar NPSG 09/05/10  AHI 50.9 per hour, CPAP to 18, body weight 364 pounds  --------------------------------------------------------------------------------- 10/13/2016-52 year old female never smoker followed for OSA, complicated by obesity, HBP, GERD/Barrett's, chronic respiratory failure with hypoxia, bipolar CPAP 18/[facility], O2 2 L FOLLOWS FOR: DME: [facility] Pt wears CPAP nightly for about 6 hours; no complaints. Pt is going to be scheduled for weight loss surgery after the first of the year. Very comfortable with CPAP and describes excellent compliance with improved sleep quality. No active respiratory complaints.  01/30/18- 52 year old female never smoker followed for OSA, complicated by obesity, HBP, GERD/Barrett's, chronic respiratory failure with hypoxia, bipolar CPAP 18/ [facility], O2 2 L ----OSA; DME: [facility]. Pt wears CPAP and DL attached. Pt is due for new CPAP(current one is outdated) per DME.  Lost 165 pounds after bariatric surgery (Roux-en-Y) in April, 2018.  Had to get smaller mask.  Tried to sleep without CPAP 1 power outage this winter and recognized feeling "groggy and unrested" next day.  Download compliance 78%. Breathing is comfortable on room air, denying cough or wheeze. CXR 03/17/17 IMPRESSION: 1. Right mid lung field pleuroparenchymal thickening consistent scarring. Low lung volumes. 2.  No acute cardiopulmonary disease.  ROS-see HPI   + = positive Constitutional:    +weight loss, night sweats, fevers, chills, fatigue, lassitude. HEENT:    headaches, difficulty swallowing, tooth/dental problems, sore throat,       sneezing, itching, ear ache, nasal congestion, post nasal drip, snoring CV:    chest pain, orthopnea, PND, swelling in lower extremities, anasarca,                                                  dizziness, palpitations Resp:   shortness of breath with exertion or at rest.                productive cough,   non-productive cough, coughing up of blood.              change in color of mucus.  wheezing.   Skin:    rash or lesions. GI:  No-   heartburn, indigestion, abdominal pain, nausea, vomiting,  GU:  MS:   joint pain, stiffness, decreased range of motion, back pain. Neuro-     nothing unusual Psych:  change in mood or affect.  depression or anxiety.   memory loss.  OBJ- Physical Exam General- Alert, Oriented, Affect-appropriate, Distress- none acute, +significant weight loss-looks better Skin- rash-none, lesions- none, excoriation- none Lymphadenopathy- none Head- atraumatic            Eyes- Gross vision intact, PERRLA, conjunctivae and secretions clear            Ears- Hearing, canals-normal            Nose- Clear, no-Septal dev, mucus, polyps, erosion, perforation             Throat- Mallampati IV , mucosa clear , drainage- none, tonsils- atrophic Neck- flexible , trachea midline, no stridor , thyroid nl, carotid no bruit Chest - symmetrical excursion , unlabored           Heart/CV- RRR , no murmur , no gallop  , no rub, nl s1 s2                           -  JVD- none , edema- none, stasis changes- none, varices- none           Lung- clear to P&A, wheeze- none, cough- none , dullness-none, rub- none           Chest wall-  Abd-  Br/ Gen/ Rectal- Not done, not indicated Extrem- cyanosis- none, clubbing, none, atrophy- none, strength- nl Neuro- grossly intact to observation

## 2018-01-30 NOTE — Assessment & Plan Note (Signed)
This problem can be resolved after 165 pound weight loss in less than a year following bariatric surgery 2018

## 2018-01-30 NOTE — Assessment & Plan Note (Signed)
Old scarring but no obvious active lung disease now.  Obesity hypoventilation component resolved by bariatric surgery.  When she is stable after replacement of old CPAP machine, we can reassess need for oxygen.

## 2018-01-30 NOTE — Assessment & Plan Note (Signed)
Importance of her significant weight loss discussed.  Appropriate to replace CPAP machine and we explained use of AutoPap.  She still feels better wearing CPAP. Plan-replacement machine AutoPap 5-20

## 2018-01-31 ENCOUNTER — Ambulatory Visit: Payer: Medicare Other | Admitting: Adult Health

## 2018-01-31 ENCOUNTER — Encounter: Payer: Self-pay | Admitting: Adult Health

## 2018-01-31 VITALS — BP 108/74 | HR 74 | Ht 64.0 in | Wt 147.8 lb

## 2018-01-31 DIAGNOSIS — G43719 Chronic migraine without aura, intractable, without status migrainosus: Secondary | ICD-10-CM | POA: Diagnosis not present

## 2018-01-31 NOTE — Progress Notes (Signed)
BOTOX LOT [id number] exp 07/2020  NDC 1856314970  X 2 vials Normal Saline NDC 26378-588-50  LOT [id number]  Exp 06/2019. sy

## 2018-01-31 NOTE — Progress Notes (Signed)
Consent Form Botulism Toxin Injection For Chronic Migraine  Botulism toxin has been approved by the Federal drug administration for treatment of chronic migraine. Botulism toxin does not cure chronic migraine and it may not be effective in some patients.  The administration of botulism toxin is accomplished by injecting a small amount of toxin into the muscles of the neck and head. Dosage must be titrated for each individual. Any benefits resulting from botulism toxin tend to wear off after 3 months with a repeat injection required if benefit is to be maintained. Injections are usually done every 3-4 months with maximum effect peak achieved by about 2 or 3 weeks. Botulism toxin is expensive and you should be sure of what costs you will incur resulting from the injection.  The side effects of botulism toxin use for chronic migraine may include:   -Transient, and usually mild, facial weakness with facial injections  -Transient, and usually mild, head or neck weakness with head/neck injections  -Reduction or loss of forehead facial animation due to forehead muscle              weakness  -Eyelid drooping  -Dry eye  -Pain at the site of injection or bruising at the site of injection  -Double vision  -Potential unknown long term risks  Contraindications: You should not have Botox if you are pregnant, nursing, allergic to albumin, have an infection, skin condition, or muscle weakness at the site of the injection, or have myasthenia gravis, Lambert-Eaton syndrome, or ALS.  It is also possible that as with any injection, there may be an allergic reaction or no effect from the medication. Reduced effectiveness after repeated injections is sometimes seen and rarely infection at the injection site may occur. All care will be taken to prevent these side effects. If therapy is given over a long time, atrophy and wasting in the muscle injected may occur. Occasionally the patient's become refractory to  treatment because they develop antibodies to the toxin. In this event, therapy needs to be modified.  I have read the above information and consent to the administration of botulism toxin.    ______________  _____   _________________  Patient signature     Date   Witness signature       BOTOX PROCEDURE NOTE FOR MIGRAINE HEADACHE    Contraindications and precautions discussed with patient(above). Aseptic procedure was observed and patient tolerated procedure. Procedure performed by Ward Givens NP.  The condition has existed for more than 6 months, and pt does not have a diagnosis of ALS, Myasthenia Gravis or Lambert-Eaton Syndrome. Risks and benefits of injections discussed and pt agrees to proceed with the procedure. Written consent obtained  These injections are medically necessary. She has not received any additional benefit with Botox. We will complete this round of Botox and if no improvement Botox will be discontinued. Patient was advised to increase Depakote to 500 mg in the morning and 1000 mg at bedtime in the mean time.    Indication/Diagnosis: chronic migraine BOTOX(J0585) injection was performed according to protocol by [employer]. 200 units of BOTOX was dissolved into 4 cc NS.  NDC: [address]-01  Type of toxin: Botox  Lot #[id number] EXP:07/2020   Description of procedure:  The patient was placed in a sitting position. The standard protocol was used for Botox as follows, with 5 units of Botox injected at each site:   -Procerus muscle, midline injection  -Corrugator muscle, bilateral injection  -Frontalis muscle, bilateral injection, with 2 sites  each side, medial injection was performed in the upper one third of the frontalis muscle, in the region vertical from the medial inferior edge of the superior orbital rim. The lateral injection was again in the upper one third of the forehead vertically above the lateral limbus of the cornea, 1.5 cm lateral to the  medial injection site.  -Temporalis muscle injection, 4 sites, bilaterally. The first injection was 3 cm above the tragus of the ear, second injection site was 1.5 cm to 3 cm up from the first injection site in line with the tragus of the ear. The third injection site was 1.5-3 cm forward between the first 2 injection sites. The fourth injection site was 1.5 cm posterior to the second injection site.  -Occipitalis muscle injection, 3 sites, bilaterally. The first injection was done one half way between the occipital protuberance and the tip of the mastoid process behind the ear. The second injection site was done lateral and superior to the first, 1 fingerbreadth from the first injection. The third injection site was 1 fingerbreadth superiorly and medially from the first injection site.  -Cervical paraspinal muscle injection, 2 sites, bilateral knee first injection site was 1 cm from the midline of the cervical spine, 3 cm inferior to the lower border of the occipital protuberance. The second injection site was 1.5 cm superiorly and laterally to the first injection site.  -Trapezius muscle injection was performed at 3 sites, bilaterally. The first injection site was in the upper trapezius muscle halfway between the inflection point of the neck, and the acromion. The second injection site was one half way between the acromion and the first injection site. The third injection was done between the first injection site and the inflection point of the neck.   Will return in 3 months for follow-up.   A 200 unit sof Botox was used, 155 units were injected, the rest of the Botox was wasted. The patient tolerated the procedure well, there were no complications of the above procedure.   Ward Givens, MSN, NP-C 01/31/2018, 10:23 AM [facility] [address], [place], [place] [address] [phone]

## 2018-02-01 ENCOUNTER — Other Ambulatory Visit: Payer: Self-pay | Admitting: General Surgery

## 2018-02-01 ENCOUNTER — Ambulatory Visit
Admission: RE | Admit: 2018-02-01 | Discharge: 2018-02-01 | Disposition: A | Discharge status: Home / Self Care | Payer: Medicare Other | Source: Ambulatory Visit | Attending: General Surgery | Admitting: General Surgery

## 2018-02-01 DIAGNOSIS — R1013 Epigastric pain: Secondary | ICD-10-CM

## 2018-02-01 DIAGNOSIS — K219 Gastro-esophageal reflux disease without esophagitis: Secondary | ICD-10-CM | POA: Diagnosis not present

## 2018-02-02 DIAGNOSIS — G4733 Obstructive sleep apnea (adult) (pediatric): Secondary | ICD-10-CM | POA: Diagnosis not present

## 2018-02-05 ENCOUNTER — Encounter: Payer: Medicare Other | Admitting: Skilled Nursing Facility1

## 2018-02-05 ENCOUNTER — Encounter: Payer: Self-pay | Admitting: Skilled Nursing Facility1

## 2018-02-05 DIAGNOSIS — Z713 Dietary counseling and surveillance: Secondary | ICD-10-CM | POA: Diagnosis not present

## 2018-02-05 DIAGNOSIS — Z9884 Bariatric surgery status: Secondary | ICD-10-CM | POA: Diagnosis not present

## 2018-02-05 DIAGNOSIS — E119 Type 2 diabetes mellitus without complications: Secondary | ICD-10-CM

## 2018-02-05 NOTE — Progress Notes (Signed)
RYGB Pt states she does not believe it is carbohydrates causing her issues. Blood sugars fasting: 87, 72, 78, 74, 76, 76, 78 and post prandial 2 hours: 98, 80, 80, 90, 82, 111, 98 Pt states her upper endoscopy came back unremarkable. Pt states she has been feeling good with only one "spell": feeling shaky with her blood sugar at 72. Pt states she still feels dull pains in her stomach continuously. Pt states she was feeling very nauseated Saturday with "sour beltches" and some acid reflux that morning-last time having had a bowel movement Tuesday: dietitian advised she contact her doctor. Pt states sugar free flavorings make her stomach cramp makes her feel bloated. Pt states as she goes on in the day and eats more food her stomach hurts but some days having from the time she wakes up to the time she goes to bed. Pt states she cannot drink more fluid because it hurts her stomach.   Surgery date: 03/21/2017 Surgery type: RYGB Start weight at [facility]: 312.5 Weight today: 148.7 (02/05/2018) Weight Change: 36.4  TANITA  BODY COMP RESULTS  04/04/2017 05/16/2017 06/15/2017 09/18/2017 01/22/2018   BMI (kg/m^2) 42.7 40.0 37.8 31.8 25.5   Fat Mass (lbs) 118.2 110.4 100.2 65.4 44.2   Fat Free Mass (lbs) 130.6 122.8 120.2 119.6 104.4   Total Body Water (lbs) 95.6 89.4 87 85 72.8   24-hr recall: B (AM): 1/2 protein shake + egg OR protein shake Snack: 1 tsp peanut butter  L (PM): stew beef with carrots and potatoes  Or 6 pcs chicken nuggets (fast food and breaded) Snk (PM): other half of protein shake    1 hour later: 1 tsp peanut butter (just feeling snacky) D (PM): baked flounder with blackeye peas  Or chicken and carrots   Snk (PM):   Fluid intake: water, unsweet tea: Aiming for 50 fluid ounces  Estimated total protein intake: 70  Medications: taking Carafate and protonix  Supplementation: procare health capsule and calcium   CBG monitoring: 2 times a day Average CBG per patient: under  63,01,601 Last patient reported A1c: 5.2  Using straws: no Drinking while eating: no Having you been chewing well: yes Chewing/swallowing difficulties: no Changes in vision: no Changes to mood/headaches: no Hair loss/Cahnges to skin/Changes to nails: no Any difficulty focusing or concentrating: no Sweating: no Dizziness/Lightheaded: no Palpitations: no  Carbonated beverages: no N/V/D/C/GAS:  Constipation: taking linzess and miralax  Abdominal Pain: no Dumping syndrome: possible  Recent physical activity:  Water aerobics 3 days week 60 minutes (not since her lowered immune system) walking in the house  Progress Towards Goal(s):  In progress.  Handouts given during visit include:  NS veggies + protein   Nutritional Diagnosis:  [place]-3.3 Overweight/obesity related to past poor dietary habits and physical inactivity as evidenced by patient w/ recent RYGB surgery following dietary guidelines for continued weight loss.    Intervention:  Nutrition counseling. Dietitian educated the pt on advancing her diet to include ns veggies while having enough room to add more fluid into her day. Goals: -Call your doctor about your constipation  -Try a plant based protein shake like pea protein or vegan, not whey protein: and mix with almond milk -Try frozen fruit or lemon juice or lime juice or cinnamon stick in your water  -Have whole wheat slice of bread with half to whole plant based protein shake Teaching Method Utilized:  Visual Auditory Hands on  Barriers to learning/adherence to lifestyle change: lack of internal cues for hydration/blood  sugars  Demonstrated degree of understanding via:  Teach Back   Monitoring/Evaluation:  Dietary intake, exercise, and body weight.

## 2018-02-05 NOTE — Patient Instructions (Addendum)
-  Call your doctor about your constipation   -Try a plant based protein shake like pea protein or vegan, not whey protein: and mix with almond milk  -Try frozen fruit or lemon juice or lime juice or cinnamon stick in your water   -Have whole wheat slice of bread with half to whole plant based protein shake

## 2018-02-06 ENCOUNTER — Ambulatory Visit: Payer: Medicare Other

## 2018-02-07 ENCOUNTER — Ambulatory Visit (INDEPENDENT_AMBULATORY_CARE_PROVIDER_SITE_OTHER): Payer: Medicare Other | Admitting: *Deleted

## 2018-02-07 DIAGNOSIS — E538 Deficiency of other specified B group vitamins: Secondary | ICD-10-CM | POA: Diagnosis not present

## 2018-02-07 NOTE — Progress Notes (Signed)
Pt given Cyanocobalamin inj Tolerated well 

## 2018-02-07 NOTE — Telephone Encounter (Signed)
Error

## 2018-02-12 ENCOUNTER — Ambulatory Visit: Payer: Self-pay | Admitting: Skilled Nursing Facility1

## 2018-02-13 ENCOUNTER — Encounter: Payer: Medicare Other | Attending: General Surgery | Admitting: Skilled Nursing Facility1

## 2018-02-13 ENCOUNTER — Encounter: Payer: Self-pay | Admitting: Skilled Nursing Facility1

## 2018-02-13 DIAGNOSIS — Z9884 Bariatric surgery status: Secondary | ICD-10-CM | POA: Insufficient documentation

## 2018-02-13 DIAGNOSIS — E669 Obesity, unspecified: Secondary | ICD-10-CM | POA: Insufficient documentation

## 2018-02-13 DIAGNOSIS — E119 Type 2 diabetes mellitus without complications: Secondary | ICD-10-CM

## 2018-02-13 DIAGNOSIS — Z713 Dietary counseling and surveillance: Secondary | ICD-10-CM | POA: Insufficient documentation

## 2018-02-13 NOTE — Progress Notes (Signed)
RYGB Pt states she does not believe it is carbohydrates causing her issues.  Pt states she called the gastroenterologist and the nurse told her to do a bowel prep resulting in a movement 1.5 hours latter and then diarrhea. Pt states her linzess has been increased and so far there is no difference in her bowels. Pt states she feels gas is so built up inside of her she does not want to eat or drink. Pt states she tried a pea protein shake resulting in cramping the shake 20g CHO with 14 g sugars and 4 g erythritol. Pt is very frustrated with her abdominal pain and not finding a cause.   Surgery date: 03/21/2017 Surgery type: RYGB Start weight at [facility]: 312.5 Weight today: 148.7 (02/05/2018) Weight Change: 36.4  TANITA  BODY COMP RESULTS  04/04/2017 05/16/2017 06/15/2017 09/18/2017 01/22/2018   BMI (kg/m^2) 42.7 40.0 37.8 31.8 25.5   Fat Mass (lbs) 118.2 110.4 100.2 65.4 44.2   Fat Free Mass (lbs) 130.6 122.8 120.2 119.6 104.4   Total Body Water (lbs) 95.6 89.4 87 85 72.8   24-hr recall: B (AM): 1 piece of toast and protein shake Snack: 1 tsp peanut butter  L (PM):1/2 cup chili beans with hamburger and chili powder and tomato sauce  Snk (PM): half breakfast bar: belvita D (PM): broccoli corn carrots and chicken frozen mix with pasta  Snk (PM):   Fluid intake: water, unsweet tea: Aiming for 50 fluid ounces  Estimated total protein intake: 70  Medications: taking Carafate and protonix  Supplementation: procare health capsule with food and calcium and b12 shot every month  CBG monitoring: 2 times a day Average CBG per patient: under 79,101 Last patient reported A1c: 5.2  Using straws: no Drinking while eating: no Having you been chewing well: yes Chewing/swallowing difficulties: no Changes in vision: no Changes to mood/headaches: no Hair loss/Cahnges to skin/Changes to nails: no Any difficulty focusing or concentrating: no Sweating: no Dizziness/Lightheaded: no Palpitations: no   Carbonated beverages: no N/V/D/C/GAS:  Constipation: taking linzess and miralax  Abdominal Pain: no Dumping syndrome: possible  Recent physical activity:  Water aerobics 3 days week 60 minutes (not since her lowered immune system) walking in the house  Progress Towards Goal(s):  In progress.  Handouts given during visit include:  NS veggies + protein   Nutritional Diagnosis:  -3.3 Overweight/obesity related to past poor dietary habits and physical inactivity as evidenced by patient w/ recent RYGB surgery following dietary guidelines for continued weight loss.    Intervention:  Nutrition counseling. Dietitian educated the pt on advancing her diet to include ns veggies while having enough room to add more fluid into her day. Goals: -only eat soft moist proteins (protein sheet examples given) -No dairy -No Beans -No vegetables  -No protein shakes  -Take small bites chew until applesauce consistency  Teaching Method Utilized:  Visual Auditory Hands on  Barriers to learning/adherence to lifestyle change:abdominal pain  Demonstrated degree of understanding via:  Teach Back   Monitoring/Evaluation:  Dietary intake, exercise, and body weight.

## 2018-02-19 DIAGNOSIS — M5431 Sciatica, right side: Secondary | ICD-10-CM | POA: Diagnosis not present

## 2018-02-20 ENCOUNTER — Encounter: Payer: Medicare Other | Admitting: Skilled Nursing Facility1

## 2018-02-20 DIAGNOSIS — Z713 Dietary counseling and surveillance: Secondary | ICD-10-CM | POA: Diagnosis not present

## 2018-02-20 DIAGNOSIS — E119 Type 2 diabetes mellitus without complications: Secondary | ICD-10-CM

## 2018-02-20 DIAGNOSIS — Z9884 Bariatric surgery status: Secondary | ICD-10-CM | POA: Diagnosis not present

## 2018-02-20 NOTE — Progress Notes (Signed)
Morgan [name] Pt states she does not believe it is carbohydrates causing her issues. Pt states she is still experiencing Pain in the sternum area all day every day some days worse than others. Pt states she takes a capsule probiotic every day. Pt states sometimes she does not sleep well due to the pain. Habits: Chewing well and size of thumb nail and not drinking with meals and taking at least 20 minutes to eat. Pt states the pain is causing her to have trouble breathing. Pt states she is taking protonix every day.Pt states her  Lower back hurting possibly arthritis. Pt states With the pain it is hard to drink throughout the day. Pt states she does not want to lose anymore weight and is worried about this.   Surgery date: 03/21/2017 Surgery type: Morgan [name] Start weight at [facility]: 312.5 Weight today:142.2  TANITA  BODY COMP RESULTS  04/04/2017 05/16/2017 06/15/2017 09/18/2017 01/22/2018   BMI (kg/m^2) 42.7 40.0 37.8 31.8 25.5   Fat Mass (lbs) 118.2 110.4 100.2 65.4 44.2   Fat Free Mass (lbs) 130.6 122.8 120.2 119.6 104.4   Total Body Water (lbs) 95.6 89.4 87 85 72.8   24-hr recall: B (AM): eggs cooked in pam and half a piece of bolgna Snack: 1 tsp of peanut butter and cracker  L (PM):deer tenderloin Snk (PM):  D (PM): chicken or deer dipped in mustard Snk (PM):   Fluid intake: water, unsweet tea: Aiming for 50 fluid ounces  Estimated total protein intake: 70  Medications: taking Carafate and protonix  Supplementation: procare health capsule with food and calcium and b12 shot every month  CBG monitoring: 2 times a day Average CBG per patient: 77, 87, 99, 101 Last patient reported A1c: 5.2  Using straws: no Drinking while eating: no Having you been chewing well: yes Chewing/swallowing difficulties: no Changes in vision: no Changes to mood/headaches: no Hair loss/Cahnges to skin/Changes to nails: more hair coming out and thinner but not chunks lost  Any difficulty focusing or concentrating:  no Sweating: no Dizziness/Lightheaded: no Palpitations: no  Carbonated beverages: no N/V/D/C/GAS:  Constipation: taking linzess and miralax: had one wends day and after that liquid bowels movements  Abdominal Pain: no Dumping syndrome: possible  Recent physical activity:  Water aerobics 3 days week 60 minutes walking in the house  Progress Towards Goal(s):  In progress.  Handouts given during visit include:  NS veggies + protein   Nutritional Diagnosis:  Enlow-3.3 Overweight/obesity related to past poor dietary habits and physical inactivity as evidenced by patient w/ recent Morgan [name] surgery following dietary guidelines for continued weight loss.    Intervention:  Nutrition counseling. Dietitian educated the pt on advancing her diet to include ns veggies while having enough room to add more fluid into her day. Goals: -Wait until afternoon for multivitamin -Eat like you were eating before Teaching Method Utilized:  Visual Auditory Hands on  Barriers to learning/adherence to lifestyle change:abdominal pain  Demonstrated degree of understanding via:  Teach Back   Monitoring/Evaluation:  Dietary intake, exercise, and body weight.

## 2018-02-20 NOTE — Patient Instructions (Addendum)
-  Wait until afternoon for multivitamin  -Eat like you were eating before

## 2018-02-26 ENCOUNTER — Telehealth: Payer: Self-pay | Admitting: Adult Health

## 2018-02-26 DIAGNOSIS — M545 Low back pain: Secondary | ICD-10-CM | POA: Diagnosis not present

## 2018-02-26 DIAGNOSIS — G4733 Obstructive sleep apnea (adult) (pediatric): Secondary | ICD-10-CM | POA: Diagnosis not present

## 2018-02-26 DIAGNOSIS — R0902 Hypoxemia: Secondary | ICD-10-CM | POA: Diagnosis not present

## 2018-02-26 MED ORDER — ERENUMAB-AOOE 70 MG/ML ~~LOC~~ SOAJ: 140.0000 mg | SUBCUTANEOUS | 4 refills | Status: DC

## 2018-02-26 NOTE — Telephone Encounter (Signed)
I called the patient.  The Botox has not been effective.  She last got an injection around 31 January 2018.  As we talked in the revisit in January, we will try Aimovig for the headache.  We will stop the Botox.

## 2018-02-26 NOTE — Telephone Encounter (Signed)
Pt called wanting to let RN and Jinny Blossom know that the botox injections have not worked for her, stating her headaches are "still regular and pounding" FYI

## 2018-02-27 ENCOUNTER — Telehealth: Payer: Self-pay | Admitting: *Deleted

## 2018-02-27 NOTE — Telephone Encounter (Signed)
Submitted PA. Waiting on determination. °

## 2018-02-27 NOTE — Telephone Encounter (Signed)
Initiated PA Aimovig on covermymeds. Key: [place]. In process of completing.

## 2018-02-27 NOTE — Telephone Encounter (Signed)
Received notification via fax from [employer] that PA Aimovig approved effective 02/27/2018-05/30/2018 under Medicare part D benefit. Reference#: [id number].  Faxed approval to [facility] at [phone]. Received fax notification.

## 2018-02-27 NOTE — Telephone Encounter (Signed)
Noted, thank you

## 2018-02-27 NOTE — Addendum Note (Signed)
Addended by: Hope Pigeon on: 02/27/2018 03:11 PM   Modules accepted: Orders

## 2018-02-28 ENCOUNTER — Telehealth: Payer: Self-pay | Admitting: Skilled Nursing Facility1

## 2018-02-28 NOTE — Telephone Encounter (Signed)
Yesterday stomach really hurt all day seems to be getting worse. Pt states she really want to get in sooner with her surgeon.

## 2018-03-02 DIAGNOSIS — G4733 Obstructive sleep apnea (adult) (pediatric): Secondary | ICD-10-CM | POA: Diagnosis not present

## 2018-03-08 ENCOUNTER — Ambulatory Visit (INDEPENDENT_AMBULATORY_CARE_PROVIDER_SITE_OTHER): Payer: Medicare Other | Admitting: *Deleted

## 2018-03-08 DIAGNOSIS — E538 Deficiency of other specified B group vitamins: Secondary | ICD-10-CM

## 2018-03-08 NOTE — Progress Notes (Signed)
Pt given B12 injection IM right deltoid and tolerated well. 

## 2018-03-12 ENCOUNTER — Other Ambulatory Visit: Payer: Self-pay | Admitting: Family Medicine

## 2018-03-12 DIAGNOSIS — M5416 Radiculopathy, lumbar region: Secondary | ICD-10-CM | POA: Diagnosis not present

## 2018-03-12 DIAGNOSIS — M545 Low back pain: Secondary | ICD-10-CM | POA: Diagnosis not present

## 2018-03-26 DIAGNOSIS — M545 Low back pain: Secondary | ICD-10-CM | POA: Diagnosis not present

## 2018-03-26 DIAGNOSIS — M5416 Radiculopathy, lumbar region: Secondary | ICD-10-CM | POA: Diagnosis not present

## 2018-03-28 DIAGNOSIS — Z9884 Bariatric surgery status: Secondary | ICD-10-CM | POA: Diagnosis not present

## 2018-03-28 DIAGNOSIS — R1013 Epigastric pain: Secondary | ICD-10-CM | POA: Diagnosis not present

## 2018-03-28 DIAGNOSIS — K912 Postsurgical malabsorption, not elsewhere classified: Secondary | ICD-10-CM | POA: Diagnosis not present

## 2018-03-28 DIAGNOSIS — K5909 Other constipation: Secondary | ICD-10-CM | POA: Diagnosis not present

## 2018-03-29 DIAGNOSIS — G4733 Obstructive sleep apnea (adult) (pediatric): Secondary | ICD-10-CM | POA: Diagnosis not present

## 2018-03-29 DIAGNOSIS — R0902 Hypoxemia: Secondary | ICD-10-CM | POA: Diagnosis not present

## 2018-03-31 ENCOUNTER — Other Ambulatory Visit: Payer: Self-pay | Admitting: Family Medicine

## 2018-04-02 DIAGNOSIS — G4733 Obstructive sleep apnea (adult) (pediatric): Secondary | ICD-10-CM | POA: Diagnosis not present

## 2018-04-04 DIAGNOSIS — R1013 Epigastric pain: Secondary | ICD-10-CM | POA: Diagnosis not present

## 2018-04-05 DIAGNOSIS — G4733 Obstructive sleep apnea (adult) (pediatric): Secondary | ICD-10-CM | POA: Diagnosis not present

## 2018-04-09 ENCOUNTER — Ambulatory Visit (INDEPENDENT_AMBULATORY_CARE_PROVIDER_SITE_OTHER): Payer: Medicare Other | Admitting: *Deleted

## 2018-04-09 DIAGNOSIS — E538 Deficiency of other specified B group vitamins: Secondary | ICD-10-CM | POA: Diagnosis not present

## 2018-04-09 NOTE — Progress Notes (Signed)
Pt given Cyanocobalamin inj Tolerated well 

## 2018-04-10 ENCOUNTER — Ambulatory Visit: Payer: Medicare Other

## 2018-04-10 DIAGNOSIS — M5416 Radiculopathy, lumbar region: Secondary | ICD-10-CM | POA: Diagnosis not present

## 2018-04-10 DIAGNOSIS — M546 Pain in thoracic spine: Secondary | ICD-10-CM | POA: Diagnosis not present

## 2018-04-10 DIAGNOSIS — M545 Low back pain: Secondary | ICD-10-CM | POA: Diagnosis not present

## 2018-04-19 ENCOUNTER — Ambulatory Visit: Payer: Medicare Other | Admitting: Adult Health

## 2018-04-19 ENCOUNTER — Encounter: Payer: Self-pay | Admitting: Adult Health

## 2018-04-19 VITALS — BP 104/67 | HR 59 | Ht 64.0 in | Wt 130.0 lb

## 2018-04-19 DIAGNOSIS — G43009 Migraine without aura, not intractable, without status migrainosus: Secondary | ICD-10-CM | POA: Diagnosis not present

## 2018-04-19 NOTE — Progress Notes (Signed)
PATIENT: Morgan [name] DOB: May 26, 1966  REASON FOR VISIT: follow up HISTORY FROM: patient  HISTORY OF PRESENT ILLNESS: Today 04/19/18 Morgan [name] is a 52 year old female with a history of migraine headaches.  She returns today for follow-up.  She is currently on Monday and reports that it has decreased her headache frequency.  She states that she has taken 2 injections.  She reports the first month her headaches decreased from having a daily headache down to 10 days a month.  She reports after the second injection her headaches increased to 8 headache with only four of those being severe.  She states that when she gets a severe headache she can take Tylenol.  The patient did have gastric bypass.  He has lost over 100 pounds.  She states that she is having some issues with her stomach and is being followed by GI.  She also reports that she has been diagnosed with 2 bulging disks in the lower back.  She is seeing her orthopedist for this.  She returns today for evaluation.  HISTORY 12/19/17 (Copied from Dr. Tobey Grim note): Morgan [name] is a 52 year old right-handed white female with a history of bipolar disorder and chronic migraine headache.  The patient has in the past again benefit from Botox injections but these were too expensive for her.  She has been able to restart the injections through this office at minimal cost to her.  The patient has had 2 injections, the last injection was in November 2018.  Unfortunately, the patient believes that the headaches have worsened on Botox rather than improved.  She is now having headaches every day, she is incapacitated by the headaches about 15 days of the month.  Headaches last all day long.  There may be associated with nausea without vomiting.  The patient does believe she gains benefit from gabapentin taking 600 mg twice daily, higher doses increase her headache.  The patient is using injectable Imitrex up to 4 times a week, currently she believes that  the Imitrex makes her headaches worse rather than better.  She remains on tizanidine and propranolol, these medications have not offered much benefit.  She does indicate that she has had some problems with peptic ulcer disease, she is on Protonix and sucralfate.   REVIEW OF SYSTEMS: Out of a complete 14 system review of symptoms, the patient complains only of the following symptoms, and all other reviewed systems are negative.  See HPI  ALLERGIES: Allergies  Allergen Reactions  . Ketoconazole     REACTION: hives, swelling  . Pravachol [Pravastatin Sodium] Shortness Of Breath, Swelling and Anaphylaxis    Throat swelling  . Tape Other (See Comments)  . Codeine     REACTION: increased BP  . Statins     REACTION: leg crapms    HOME MEDICATIONS: Outpatient Medications Prior to Visit  Medication Sig Dispense Refill  . Cholecalciferol (VITAMIN D) 2000 UNITS CAPS Take 2,000 Units by mouth daily.    . diclofenac sodium (VOLTAREN) 1 % GEL Apply 2 g topically 2 (two) times daily as needed (JOINT PAIN). Use as directed for knee pain    . dicyclomine (BENTYL) 10 MG capsule Take by mouth.    . divalproex (DEPAKOTE) 500 MG DR tablet 1 tablet in the morning, 2 in the evening (Patient taking differently: Take 500 mg by mouth 2 (two) times daily. ) 90 tablet 3  . Erenumab-aooe (AIMOVIG 140 DOSE) 70 MG/ML SOAJ Inject 140 mg into the skin every  30 (thirty) days. 2 pen 4  . ezetimibe (ZETIA) 10 MG tablet Take 1 tablet (10 mg total) by mouth daily. 30 tablet 4  . fluticasone (FLONASE) 50 MCG/ACT nasal spray SPRAY 1 SPRAY IN EACH NOSTRIL TWICE DAILY. SHAKE GENTLY BEFORE EACH [place] E. 16 g 5  . gabapentin (NEURONTIN) 300 MG capsule Take 2 capsules (600 mg total) 2 (two) times daily by mouth. 360 capsule 3  . levothyroxine (SYNTHROID, LEVOTHROID) 150 MCG tablet TAKE 1 TABLET DAILY 30 tablet 10  . linaclotide (LINZESS) 145 MCG CAPS capsule Take 1 capsule (145 mcg total) by mouth daily. 90 capsule 3  .  loratadine (CLARITIN) 10 MG tablet Take 10 mg by mouth daily as needed for allergies. Prn      . Multiple Vitamin (MULTIVITAMIN) capsule Take 1 capsule by mouth daily.     Marland Kitchen nystatin (NYAMYC) powder APPLY TOPICALLY TWICE A DAY AS NEEDED 30 g 0  . ONE TOUCH ULTRA TEST test strip CHECK BLOOD SUGAR TWICE A DAY AS DIRECTED 200 each 3  . OXYGEN Inhale 2 L into the lungs Nightly.    . pantoprazole (PROTONIX) 40 MG tablet Take 1 tablet by mouth daily.     Marland Kitchen PAZEO 0.7 % SOLN Place 1 drop into both eyes every morning.    . Probiotic Product (HEALTHY COLON PO) Take 1 capsule by mouth daily.     . RESTASIS MULTIDOSE 0.05 % ophthalmic emulsion Place 1 drop into both eyes 2 (two) times daily.    . sertraline (ZOLOFT) 100 MG tablet TAKE (2) TABLETS DAILY 180 tablet 3  . tiZANidine (ZANAFLEX) 2 MG tablet Take 1 tablet (2 mg total) by mouth 3 (three) times daily. 90 tablet 3  . traMADol (ULTRAM) 50 MG tablet Take 1 tablet by mouth every 6 (six) hours as needed.    . ziprasidone (GEODON) 60 MG capsule TAKE 1 CAPSULE IN THE EVENING 30 capsule 5   Facility-Administered Medications Prior to Visit  Medication Dose Route Frequency Provider Last Rate Last Dose  . cyanocobalamin ((VITAMIN B-12)) injection 1,000 mcg  1,000 mcg Intramuscular Q30 days Wardell Honour, MD   1,000 mcg at 04/09/18 0802    PAST MEDICAL HISTORY: Past Medical History:  Diagnosis Date  . Allergic rhinitis   . Barrett's esophagus   . Bipolar affective disorder ([place])   . Chronic respiratory failure ([place])   . Constipation, chronic   . Diabetes mellitus without complication ([place])    type 2   . Dry eye   . Gastroparesis   . GERD (gastroesophageal reflux disease)   . History of bariatric surgery 03/2017  . Hyperlipidemia   . Hypertension   . Intractable chronic migraine without aura 06/04/2015  . Migraine headache   . Morbid obesity ([place])   . OSA (obstructive sleep apnea)    uses a cpap with 2 units o2   . Osteoarthritis     bilateral knee  . Sleep apnea    has c-pap  . Unspecified hypothyroidism   . Vitamin B 12 deficiency   . Vitamin D deficiency     PAST SURGICAL HISTORY: Past Surgical History:  Procedure Laterality Date  . APPENDECTOMY  1978  . CHOLECYSTECTOMY  2005  . GASTRIC ROUX-EN-Y N/A 03/21/2017   Procedure: LAPAROSCOPIC ROUX-EN-Y GASTRIC, UPPER ENDO;  Surgeon: Greer Pickerel, MD;  Location: [facility];  Service: General;  Laterality: N/A;  . SHOULDER ARTHROSCOPY  7/12   left-dsc  . TONSILLECTOMY  at age 75  .  TRIGGER FINGER RELEASE  12/20/2012   Procedure: RELEASE TRIGGER FINGER/A-1 PULLEY;  Surgeon: Tennis Must, MD;  Location: [facility];  Service: Orthopedics;  Laterality: Left;  LEFT TRIGGER THUMB RELEASE    FAMILY HISTORY: Family History  Problem Relation Age of Onset  . Asthma Father   . Allergies Father   . Heart disease Father        enlarged heart  . Peripheral vascular disease Father   . Diabetes Father   . Hyperlipidemia Father   . Arthritis Father   . Asthma Sister   . Cancer Sister        colon at 77 yr old.  . Colon cancer Sister   . Allergies Mother   . Stroke Mother 35       with hemi paralysis  . Diabetes Mother   . Hyperlipidemia Mother   . Hypertension Mother   . GI problems Mother   . Arthritis Mother   . Allergies Brother   . Early death Brother 9       congenital abormality  . Allergies Sister   . Diabetes Sister   . Asthma Sister   . Colon polyps Sister   . Hyperlipidemia Sister   . GI problems Sister        gastroporesis   . Liver disease Sister        fatty liver  . Stroke Sister        intercrandial bleed  . Diabetes Brother   . Hypertension Brother   . Hyperlipidemia Brother   . Diabetes Daughter   . Hyperlipidemia Daughter   . Hypertension Daughter   . Heart disease Paternal Aunt   . Migraines Neg Hx     SOCIAL HISTORY: Social History   Socioeconomic History  . Marital status: Single    Spouse name: Not on file  .  Number of children: 0  . Years of education: HS  . Highest education level: Not on file  Occupational History  . Occupation: disabled    Fish farm manager: UNEMPLOYED  Social Needs  . Financial resource strain: Not on file  . Food insecurity:    Worry: Not on file    Inability: Not on file  . Transportation needs:    Medical: Not on file    Non-medical: Not on file  Tobacco Use  . Smoking status: Never Smoker  . Smokeless tobacco: Never Used  Substance and Sexual Activity  . Alcohol use: No  . Drug use: No  . Sexual activity: Never    Birth control/protection: None  Lifestyle  . Physical activity:    Days per week: Not on file    Minutes per session: Not on file  . Stress: Not on file  Relationships  . Social connections:    Talks on phone: Not on file    Gets together: Not on file    Attends religious service: Not on file    Active member of club or organization: Not on file    Attends meetings of clubs or organizations: Not on file    Relationship status: Not on file  . Intimate partner violence:    Fear of current or ex partner: Not on file    Emotionally abused: Not on file    Physically abused: Not on file    Forced sexual activity: Not on file  Other Topics Concern  . Not on file  Social History Narrative   Patient is right handed.   Patient drinks 2  glasses of caffeine daily.      PHYSICAL EXAM  Vitals:   04/19/18 0724  BP: 104/67  Pulse: (!) 59  Weight: 130 lb (59 kg)  Height: 5\' 4"  (1.626 m)   Body mass index is 22.31 kg/m.  Generalized: Well developed, in no acute distress   Neurological examination  Mentation: Alert oriented to time, place, history taking. Follows all commands speech and language fluent Cranial nerve II-XII: Pupils were equal round reactive to light. Extraocular movements were full, visual field were full on confrontational test. Facial sensation and strength were normal. Uvula tongue midline. Head turning and shoulder shrug  were  normal and symmetric. Motor: The motor testing reveals 5 over 5 strength of all 4 extremities. Good symmetric motor tone is noted throughout.  Sensory: Sensory testing is intact to soft touch on all 4 extremities. No evidence of extinction is noted.  Coordination: Cerebellar testing reveals good finger-nose-finger and heel-to-shin bilaterally.  Gait and station: Patient has a slight limp on the left due to back pain.  Tandem gait not attempted. Reflexes: Deep tendon reflexes are symmetric and normal bilaterally.   DIAGNOSTIC DATA (LABS, IMAGING, TESTING) - I reviewed patient records, labs, notes, testing and imaging myself where available.  Lab Results  Component Value Date   WBC 4.3 01/25/2018   HGB 12.9 01/25/2018   HCT 39.6 01/25/2018   MCV 92 01/25/2018   PLT 181 01/25/2018      Component Value Date/Time   NA 147 (H) 01/08/2018 1134   K 4.0 01/08/2018 1134   CL 102 01/08/2018 1134   CO2 26 01/08/2018 1134   GLUCOSE 65 01/08/2018 1134   GLUCOSE 150 (H) 03/23/2017 0505   BUN 17 01/08/2018 1134   CREATININE 0.59 01/08/2018 1134   CREATININE 0.82 06/18/2013 1009   CALCIUM 9.4 01/08/2018 1134   PROT 6.1 01/08/2018 1134   ALBUMIN 3.9 01/08/2018 1134   AST 22 01/08/2018 1134   ALT 19 01/08/2018 1134   ALKPHOS 62 01/08/2018 1134   BILITOT 0.3 01/08/2018 1134   GFRNONAA 107 01/08/2018 1134   GFRNONAA 87 06/18/2013 1009   GFRAA 124 01/08/2018 1134   GFRAA >89 06/18/2013 1009   Lab Results  Component Value Date   CHOL 205 (H) 10/06/2017   HDL 41 10/06/2017   LDLCALC 135 (H) 10/06/2017   LDLDIRECT 122 (H) 09/29/2016   TRIG 146 10/06/2017   CHOLHDL 5.0 (H) 10/06/2017   Lab Results  Component Value Date   HGBA1C 6.2 (H) 03/07/2017   Lab Results  Component Value Date   VITAMINB12 1,140 (H) 07/05/2016   Lab Results  Component Value Date   TSH 0.632 10/06/2017      ASSESSMENT AND PLAN 52 y.o. year old female  has a past medical history of Allergic rhinitis,  Barrett's esophagus, Bipolar affective disorder ([place]), Chronic respiratory failure ([place]), Constipation, chronic, Diabetes mellitus without complication ([place]), Dry eye, Gastroparesis, GERD (gastroesophageal reflux disease), History of bariatric surgery (03/2017), Hyperlipidemia, Hypertension, Intractable chronic migraine without aura (06/04/2015), Migraine headache, Morbid obesity ([place]), OSA (obstructive sleep apnea), Osteoarthritis, Sleep apnea, Unspecified hypothyroidism, Vitamin B 12 deficiency, and Vitamin D deficiency. here with:  1. Migraine headaches  Overall the patient is doing well.  She will continue on Aimovig.  She will also continue Depakote.  She reports that she is having blood drawn at the end of the month.  I advised that she should ask them to add on a Depakote level so she does not have to have  blood drawn today.  She Voiced understanding.  She will follow-up in 6 months or sooner if needed.   I spent 15 minutes with the patient. 50% of this time was spent discussing her migraine treatment as well as frequency.  Ward Givens, MSN, NP-C 04/19/2018, 7:46 AM [facility] [address], [place], [place] [address] [phone]

## 2018-04-19 NOTE — Progress Notes (Signed)
I have read the note, and I agree with the clinical assessment and plan.  Charles K Willis   

## 2018-04-19 NOTE — Patient Instructions (Signed)
Your Plan:  Continue Aimovig and Depakote Have them draw a depakote level when you get blood drawn at the end of the month If your symptoms worsen or you develop new symptoms please let us know.    Thank you for coming to see [place] at [facility]. I hope we have been able to provide you high quality care today.  You may receive a patient satisfaction survey over the next few weeks. We would appreciate your feedback and comments so that we may continue to improve ourselves and the health of our patients.

## 2018-04-24 ENCOUNTER — Other Ambulatory Visit: Payer: Self-pay | Admitting: Neurology

## 2018-04-27 DIAGNOSIS — M5416 Radiculopathy, lumbar region: Secondary | ICD-10-CM | POA: Diagnosis not present

## 2018-04-27 DIAGNOSIS — M545 Low back pain: Secondary | ICD-10-CM | POA: Diagnosis not present

## 2018-04-28 DIAGNOSIS — G4733 Obstructive sleep apnea (adult) (pediatric): Secondary | ICD-10-CM | POA: Diagnosis not present

## 2018-04-28 DIAGNOSIS — R0902 Hypoxemia: Secondary | ICD-10-CM | POA: Diagnosis not present

## 2018-04-30 ENCOUNTER — Ambulatory Visit: Payer: Medicare Other | Admitting: Internal Medicine

## 2018-04-30 ENCOUNTER — Encounter: Payer: Self-pay | Admitting: Internal Medicine

## 2018-04-30 VITALS — BP 112/68 | HR 64 | Ht 64.0 in | Wt 131.0 lb

## 2018-04-30 DIAGNOSIS — Z9884 Bariatric surgery status: Secondary | ICD-10-CM | POA: Diagnosis not present

## 2018-04-30 DIAGNOSIS — G4733 Obstructive sleep apnea (adult) (pediatric): Secondary | ICD-10-CM | POA: Diagnosis not present

## 2018-04-30 NOTE — Patient Instructions (Signed)
Order- please schedule unattended home sleep test    Dx OSA  Please call me about 2 weeks after the study, for results and recommendations. We may be able to stop or at least reduce your CPAP.

## 2018-04-30 NOTE — Assessment & Plan Note (Signed)
She reports close follow-up now for excessive weight loss after bariatric surgery.  We have discussed how this will likely impact her sleep apnea and other medical problems.

## 2018-04-30 NOTE — Assessment & Plan Note (Signed)
Satisfactory compliance and control.  Residual AHI 5.6/hour suggests that she may still need CPAP although she is lost a great deal of weight following her bariatric surgery in 2018. Plan-Home sleep test to see if she still needs CPAP.

## 2018-04-30 NOTE — Progress Notes (Signed)
HPI female never smoker followed for OSA, complicated by obesity/ bariatric surgery 2018, HBP, GERD/Barrett's, chronic respiratory failure with hypoxia, bipolar NPSG 09/05/10  AHI 50.9 per hour, CPAP to 18, body weight 364 pounds  --------------------------------------------------------------------------------- 01/30/18- 52 year old female never smoker followed for OSA, complicated by obesity, HBP, GERD/Barrett's, chronic respiratory failure with hypoxia, bipolar CPAP 18/[facility], O2 2 L ----OSA; DME: [employer]. Pt wears CPAP and DL attached. Pt is due for new CPAP(current one is outdated) per DME.  Lost 165 pounds after bariatric surgery (Roux-en-Y) in April, 2018.  Had to get smaller mask.  Tried to sleep without CPAP 1 power outage this winter and recognized feeling "groggy and unrested" next day.  Download compliance 78%. Breathing is comfortable on room air, denying cough or wheeze. CXR 03/17/17 IMPRESSION: 1. Right mid lung field pleuroparenchymal thickening consistent scarring. Low lung volumes. 2.  No acute cardiopulmonary disease.  04/30/18- 52 year old female never smoker followed for OSA, bariatric surgery HBP, GERD/Barrett's, chronic respiratory failure with hypoxia, bipolar CPAP 18/[facility], O2 2 L ----OSA: DME: [employer]. Pt wears CPAP nightly and DL attached. Pt feels the pressure works well for her and no new supplies needed. Download 100% compliance AHI 5.6/hour.  Median leak 0.9. Weight today 131 lbs.-being assessed for persistent weight decline after bariatric surgery. Still paying for replacement CPAP machine.  Not sure she still needs CPAP. Breathing okay.  Primary complaints are of abdominal pain and bulging disc back pain.  Feels she sleeps okay.  ROS-see HPI   + = positive Constitutional:    +weight loss, night sweats, fevers, chills, fatigue, lassitude. HEENT:    headaches, difficulty swallowing, tooth/dental problems, sore  throat,       sneezing, itching, ear ache, nasal congestion, post nasal drip, snoring CV:    chest pain, orthopnea, PND, swelling in lower extremities, anasarca,                                                   dizziness, palpitations Resp:   shortness of breath with exertion or at rest.                productive cough,   non-productive cough, coughing up of blood.              change in color of mucus.  wheezing.   Skin:    rash or lesions. GI:  No-   heartburn, indigestion, +abdominal pain, nausea, vomiting,  GU:  MS:   joint pain, stiffness, decreased range of motion, +back pain. Neuro-     nothing unusual Psych:  change in mood or affect.  depression or anxiety.   memory loss.  OBJ- Physical Exam General- Alert, Oriented, Affect-appropriate, Distress- none acute, +thin Skin- rash-none, lesions- none, excoriation- none Lymphadenopathy- none Head- atraumatic            Eyes- Gross vision intact, PERRLA, conjunctivae and secretions clear            Ears- Hearing, canals-normal            Nose- Clear, no-Septal dev, mucus, polyps, erosion, perforation             Throat- Mallampati IV , mucosa clear , drainage- none, tonsils- atrophic Neck- flexible , trachea midline, no stridor , thyroid nl, carotid no bruit Chest - symmetrical excursion , unlabored  Heart/CV- RRR , no murmur , no gallop  , no rub, nl s1 s2                           - JVD- none , edema- none, stasis changes- none, varices- none           Lung- clear to P&A, wheeze- none, cough- none , dullness-none, rub- none           Chest wall-  Abd-  Br/ Gen/ Rectal- Not done, not indicated Extrem- cyanosis- none, clubbing, none, atrophy- none, strength- nl Neuro- grossly intact to observation

## 2018-05-02 DIAGNOSIS — G4733 Obstructive sleep apnea (adult) (pediatric): Secondary | ICD-10-CM | POA: Diagnosis not present

## 2018-05-10 ENCOUNTER — Encounter: Payer: Self-pay | Admitting: Family Medicine

## 2018-05-10 ENCOUNTER — Ambulatory Visit (INDEPENDENT_AMBULATORY_CARE_PROVIDER_SITE_OTHER): Payer: Medicare Other | Admitting: Family Medicine

## 2018-05-10 VITALS — BP 103/64 | HR 63 | Temp 97.5°F | Ht 64.0 in | Wt 126.6 lb

## 2018-05-10 DIAGNOSIS — I10 Essential (primary) hypertension: Secondary | ICD-10-CM

## 2018-05-10 DIAGNOSIS — E538 Deficiency of other specified B group vitamins: Secondary | ICD-10-CM | POA: Diagnosis not present

## 2018-05-10 DIAGNOSIS — R6889 Other general symptoms and signs: Secondary | ICD-10-CM | POA: Diagnosis not present

## 2018-05-10 DIAGNOSIS — E119 Type 2 diabetes mellitus without complications: Secondary | ICD-10-CM | POA: Diagnosis not present

## 2018-05-10 DIAGNOSIS — G43009 Migraine without aura, not intractable, without status migrainosus: Secondary | ICD-10-CM | POA: Diagnosis not present

## 2018-05-10 DIAGNOSIS — Z9884 Bariatric surgery status: Secondary | ICD-10-CM | POA: Diagnosis not present

## 2018-05-10 DIAGNOSIS — E785 Hyperlipidemia, unspecified: Secondary | ICD-10-CM | POA: Diagnosis not present

## 2018-05-10 LAB — BAYER DCA HB A1C WAIVED: HB A1C (BAYER DCA - WAIVED): 4.9 % (ref <7.0)

## 2018-05-10 MED ORDER — CYANOCOBALAMIN 1000 MCG/ML IJ SOLN
1000.0000 ug | Freq: Once | INTRAMUSCULAR | Status: AC
  Administered 2018-05-10: 1000 ug via INTRAMUSCULAR

## 2018-05-10 NOTE — Patient Instructions (Signed)
Great to see you!  Come back to see Dr. Lajuana Ripple in 4 months unless you need [place] sooner.

## 2018-05-10 NOTE — Progress Notes (Signed)
   HPI  Patient presents today for follow-up chronic medical conditions.  Patient is now status post gastric bypass, she was previously diabetic and hypertensive, she is now diet controlled on both problems.  Her blood pressures at home have been ranging 02-7 13 systolic over 74J diastolic. Her blood sugars she is still checking fasting.  They are ranging 66-1 25, mostly 80s.  Patient was having difficult time with migraine headaches, however now is doing very well onAimovig  PMH: Smoking status noted ROS: Per HPI  Objective: BP 103/64   Pulse 63   Temp (!) 97.5 F (36.4 C) (Oral)   Ht _0  (1.626 m)   Wt 126 lb 9.6 oz (57.4 kg)   BMI 21.73 kg/m  Gen: NAD, very thin HEENT: NCAT CV: RRR, good S1/S2, no murmur Resp: CTABL, no wheezes, non-labored Ext: No edema, warm Neuro: Alert and oriented, No gross deficits  Assessment and plan:  #Hypertension Diet controlled, now status post gastric bypass No medications Actually encouraging hydration with slightly soft blood pressures now  #Type 2 diabetes Now diet controlled A1c today, plan on checking 1-2 times a year. No medications  #Status post gastric bypass Labs per general surgery  Migraine Improving, per neurology Depakote level    Orders Placed This Encounter  Procedures  . CMP14+EGFR  . CBC with Differential/Platelet  . Lipid panel  . TSH  . Vitamin B12  . Iron, TIBC and Ferritin Panel  . Bayer DCA Hb A1c Waived  . Valproic acid level  . Vitamin B1  . Vitamin A     Laroy Apple, MD [facility] 05/10/2018, 8:28 AM

## 2018-05-11 ENCOUNTER — Other Ambulatory Visit: Payer: Self-pay

## 2018-05-11 DIAGNOSIS — D72819 Decreased white blood cell count, unspecified: Secondary | ICD-10-CM

## 2018-05-11 DIAGNOSIS — Z9884 Bariatric surgery status: Secondary | ICD-10-CM

## 2018-05-11 LAB — CBC WITH DIFFERENTIAL/PLATELET
BASOS ABS: 0 10*3/uL (ref 0.0–0.2)
Basos: 1 %
EOS (ABSOLUTE): 0.1 10*3/uL (ref 0.0–0.4)
Eos: 2 %
Hematocrit: 38.1 % (ref 34.0–46.6)
Hemoglobin: 12.3 g/dL (ref 11.1–15.9)
IMMATURE GRANS (ABS): 0 10*3/uL (ref 0.0–0.1)
IMMATURE GRANULOCYTES: 0 %
LYMPHS: 47 %
Lymphocytes Absolute: 1.3 10*3/uL (ref 0.7–3.1)
MCH: 30.1 pg (ref 26.6–33.0)
MCHC: 32.3 g/dL (ref 31.5–35.7)
MCV: 93 fL (ref 79–97)
MONOS ABS: 0.2 10*3/uL (ref 0.1–0.9)
Monocytes: 6 %
NEUTROS PCT: 44 %
Neutrophils Absolute: 1.2 10*3/uL — ABNORMAL LOW (ref 1.4–7.0)
PLATELETS: 207 10*3/uL (ref 150–450)
RBC: 4.08 x10E6/uL (ref 3.77–5.28)
RDW: 14.9 % (ref 12.3–15.4)
WBC: 2.7 10*3/uL — AB (ref 3.4–10.8)

## 2018-05-11 LAB — LIPID PANEL
CHOLESTEROL TOTAL: 180 mg/dL (ref 100–199)
Chol/HDL Ratio: 3 ratio (ref 0.0–4.4)
HDL: 61 mg/dL (ref >39)
LDL Calculated: 100 mg/dL — ABNORMAL HIGH (ref 0–99)
Triglycerides: 93 mg/dL (ref 0–149)
VLDL Cholesterol Cal: 19 mg/dL (ref 5–40)

## 2018-05-11 LAB — VITAMIN B12

## 2018-05-11 LAB — CMP14+EGFR
A/G RATIO: 2.2 (ref 1.2–2.2)
ALK PHOS: 39 IU/L (ref 39–117)
ALT: 35 IU/L — AB (ref 0–32)
AST: 28 IU/L (ref 0–40)
Albumin: 4 g/dL (ref 3.5–5.5)
BILIRUBIN TOTAL: 0.3 mg/dL (ref 0.0–1.2)
BUN/Creatinine Ratio: 29 — ABNORMAL HIGH (ref 9–23)
BUN: 17 mg/dL (ref 6–24)
CHLORIDE: 103 mmol/L (ref 96–106)
CO2: 29 mmol/L (ref 20–29)
Calcium: 9 mg/dL (ref 8.7–10.2)
Creatinine, Ser: 0.59 mg/dL (ref 0.57–1.00)
GFR calc Af Amer: 124 mL/min/{1.73_m2} (ref >59)
GFR calc non Af Amer: 107 mL/min/{1.73_m2} (ref >59)
Globulin, Total: 1.8 g/dL (ref 1.5–4.5)
Glucose: 77 mg/dL (ref 65–99)
POTASSIUM: 4.2 mmol/L (ref 3.5–5.2)
SODIUM: 144 mmol/L (ref 134–144)
Total Protein: 5.8 g/dL — ABNORMAL LOW (ref 6.0–8.5)

## 2018-05-11 LAB — IRON,TIBC AND FERRITIN PANEL
Ferritin: 48 ng/mL (ref 15–150)
IRON SATURATION: 24 % (ref 15–55)
IRON: 72 ug/dL (ref 27–159)
Total Iron Binding Capacity: 296 ug/dL (ref 250–450)
UIBC: 224 ug/dL (ref 131–425)

## 2018-05-11 LAB — SPECIMEN STATUS REPORT

## 2018-05-11 LAB — TSH: TSH: 0.157 u[IU]/mL — ABNORMAL LOW (ref 0.450–4.500)

## 2018-05-11 LAB — VALPROIC ACID LEVEL: Valproic Acid Lvl: 36 ug/mL — ABNORMAL LOW (ref 50–100)

## 2018-05-12 LAB — VITAMIN B1

## 2018-05-12 LAB — SPECIMEN STATUS REPORT

## 2018-05-13 LAB — VITAMIN A: Vitamin A: 45.9 ug/dL (ref 20.1–62.0)

## 2018-05-16 ENCOUNTER — Telehealth: Payer: Self-pay | Admitting: Adult Health

## 2018-05-16 NOTE — Telephone Encounter (Signed)
Pt is asking for a call to find out if the lab results are available .  Pt also is asking to speak with RN QU:IVHOY blood cells

## 2018-05-16 NOTE — Telephone Encounter (Signed)
I spoke to pt and relayed that will see what her repeat WBC that her pcp does on Friday shows and then proceed from there.  Pt concerned about this, but will check on Monday to see what it is and what to do.

## 2018-05-16 NOTE — Telephone Encounter (Signed)
Labs are in from 05-10-18 depakote 38.  She has questions relating to her low WBC ? Cause of taking depakote?  To repeat with pcp in one week.  (see lab results in Epic).

## 2018-05-16 NOTE — Telephone Encounter (Signed)
Depakote can cause low WBC. Would wait to see what result is in 1 week.

## 2018-05-18 ENCOUNTER — Other Ambulatory Visit: Payer: Medicare Other

## 2018-05-18 DIAGNOSIS — D72819 Decreased white blood cell count, unspecified: Secondary | ICD-10-CM

## 2018-05-18 DIAGNOSIS — Z9884 Bariatric surgery status: Secondary | ICD-10-CM | POA: Diagnosis not present

## 2018-05-18 LAB — CBC WITH DIFFERENTIAL/PLATELET
BASOS ABS: 0 10*3/uL (ref 0.0–0.2)
BASOS: 1 %
EOS (ABSOLUTE): 0.1 10*3/uL (ref 0.0–0.4)
Eos: 2 %
HEMOGLOBIN: 11.7 g/dL (ref 11.1–15.9)
Hematocrit: 35.7 % (ref 34.0–46.6)
IMMATURE GRANS (ABS): 0 10*3/uL (ref 0.0–0.1)
IMMATURE GRANULOCYTES: 0 %
LYMPHS: 53 %
Lymphocytes Absolute: 2.3 10*3/uL (ref 0.7–3.1)
MCH: 30.4 pg (ref 26.6–33.0)
MCHC: 32.8 g/dL (ref 31.5–35.7)
MCV: 93 fL (ref 79–97)
MONOCYTES: 9 %
Monocytes Absolute: 0.4 10*3/uL (ref 0.1–0.9)
NEUTROS ABS: 1.5 10*3/uL (ref 1.4–7.0)
NEUTROS PCT: 35 %
PLATELETS: 195 10*3/uL (ref 150–450)
RBC: 3.85 x10E6/uL (ref 3.77–5.28)
RDW: 14.7 % (ref 12.3–15.4)
WBC: 4.2 10*3/uL (ref 3.4–10.8)

## 2018-05-21 LAB — VITAMIN B1: THIAMINE: 183.8 nmol/L (ref 66.5–200.0)

## 2018-05-22 ENCOUNTER — Telehealth: Payer: Self-pay | Admitting: Family Medicine

## 2018-05-22 NOTE — Telephone Encounter (Signed)
Patient aware of lab results.

## 2018-05-25 DIAGNOSIS — R1013 Epigastric pain: Secondary | ICD-10-CM | POA: Diagnosis not present

## 2018-05-25 MED ORDER — LEVOTHYROXINE SODIUM 125 MCG PO TABS: 125.0000 ug | ORAL_TABLET | Freq: Every day | ORAL | 3 refills | Status: DC

## 2018-05-25 NOTE — Telephone Encounter (Signed)
Patient aware.

## 2018-05-25 NOTE — Telephone Encounter (Signed)
I agree that synthroid should be adjusted. Decrease to 125 mcg from 150.   Recheck TSH in 2 months.   Laroy Apple, MD [facility] 05/25/2018, 2:43 PM

## 2018-05-25 NOTE — Telephone Encounter (Signed)
PT has called back and said her gastro doctor looked at her labs and said her TSH value was very low and wants to know if her synthroid medicine needs to be adjusted.

## 2018-05-28 DIAGNOSIS — M545 Low back pain: Secondary | ICD-10-CM | POA: Diagnosis not present

## 2018-05-28 DIAGNOSIS — M5416 Radiculopathy, lumbar region: Secondary | ICD-10-CM | POA: Diagnosis not present

## 2018-05-29 DIAGNOSIS — R0902 Hypoxemia: Secondary | ICD-10-CM | POA: Diagnosis not present

## 2018-05-29 DIAGNOSIS — G4733 Obstructive sleep apnea (adult) (pediatric): Secondary | ICD-10-CM | POA: Diagnosis not present

## 2018-05-30 ENCOUNTER — Other Ambulatory Visit: Payer: Self-pay | Admitting: Family Medicine

## 2018-05-30 DIAGNOSIS — G4733 Obstructive sleep apnea (adult) (pediatric): Secondary | ICD-10-CM | POA: Diagnosis not present

## 2018-05-30 DIAGNOSIS — R0683 Snoring: Secondary | ICD-10-CM | POA: Diagnosis not present

## 2018-05-31 ENCOUNTER — Other Ambulatory Visit (HOSPITAL_COMMUNITY): Payer: Self-pay | Admitting: General Surgery

## 2018-05-31 DIAGNOSIS — Z789 Other specified health status: Secondary | ICD-10-CM

## 2018-05-31 DIAGNOSIS — R1013 Epigastric pain: Secondary | ICD-10-CM | POA: Diagnosis not present

## 2018-05-31 DIAGNOSIS — Z8719 Personal history of other diseases of the digestive system: Secondary | ICD-10-CM | POA: Diagnosis not present

## 2018-05-31 DIAGNOSIS — Z9884 Bariatric surgery status: Secondary | ICD-10-CM | POA: Diagnosis not present

## 2018-05-31 DIAGNOSIS — K912 Postsurgical malabsorption, not elsewhere classified: Secondary | ICD-10-CM | POA: Diagnosis not present

## 2018-05-31 DIAGNOSIS — K5909 Other constipation: Secondary | ICD-10-CM | POA: Diagnosis not present

## 2018-05-31 DIAGNOSIS — R0683 Snoring: Secondary | ICD-10-CM | POA: Diagnosis not present

## 2018-06-01 ENCOUNTER — Ambulatory Visit (HOSPITAL_COMMUNITY)
Admission: RE | Admit: 2018-06-01 | Discharge: 2018-06-01 | Disposition: A | Discharge status: Home / Self Care | Payer: Medicare Other | Source: Ambulatory Visit | Attending: General Surgery | Admitting: General Surgery

## 2018-06-01 ENCOUNTER — Other Ambulatory Visit: Payer: Self-pay | Admitting: *Deleted

## 2018-06-01 ENCOUNTER — Other Ambulatory Visit (HOSPITAL_COMMUNITY)
Admit: 2018-06-01 | Discharge: 2018-06-01 | Disposition: A | Discharge status: Home / Self Care | Payer: Medicare Other | Source: Ambulatory Visit | Attending: General Surgery | Admitting: General Surgery

## 2018-06-01 DIAGNOSIS — G4733 Obstructive sleep apnea (adult) (pediatric): Secondary | ICD-10-CM

## 2018-06-01 DIAGNOSIS — Z452 Encounter for adjustment and management of vascular access device: Secondary | ICD-10-CM | POA: Insufficient documentation

## 2018-06-01 DIAGNOSIS — E440 Moderate protein-calorie malnutrition: Secondary | ICD-10-CM | POA: Diagnosis not present

## 2018-06-01 DIAGNOSIS — Z789 Other specified health status: Secondary | ICD-10-CM

## 2018-06-01 DIAGNOSIS — Z9884 Bariatric surgery status: Secondary | ICD-10-CM | POA: Insufficient documentation

## 2018-06-01 DIAGNOSIS — K912 Postsurgical malabsorption, not elsewhere classified: Secondary | ICD-10-CM | POA: Diagnosis not present

## 2018-06-01 LAB — CBC WITH DIFFERENTIAL/PLATELET
BASOS ABS: 0 10*3/uL (ref 0.0–0.1)
BASOS PCT: 0 %
EOS PCT: 2 %
Eosinophils Absolute: 0.1 10*3/uL (ref 0.0–0.7)
HEMATOCRIT: 34.4 % — AB (ref 36.0–46.0)
Hemoglobin: 11.4 g/dL — ABNORMAL LOW (ref 12.0–15.0)
Lymphocytes Relative: 52 %
Lymphs Abs: 1.4 10*3/uL (ref 0.7–4.0)
MCH: 30.9 pg (ref 26.0–34.0)
MCHC: 33.1 g/dL (ref 30.0–36.0)
MCV: 93.2 fL (ref 78.0–100.0)
MONO ABS: 0.2 10*3/uL (ref 0.1–1.0)
MONOS PCT: 8 %
NEUTROS ABS: 1.1 10*3/uL — AB (ref 1.7–7.7)
Neutrophils Relative %: 38 %
PLATELETS: 167 10*3/uL (ref 150–400)
RBC: 3.69 MIL/uL — ABNORMAL LOW (ref 3.87–5.11)
RDW: 13.1 % (ref 11.5–15.5)
WBC: 2.8 10*3/uL — ABNORMAL LOW (ref 4.0–10.5)

## 2018-06-01 LAB — COMPREHENSIVE METABOLIC PANEL
ALBUMIN: 3.4 g/dL — AB (ref 3.5–5.0)
ALT: 58 U/L — ABNORMAL HIGH (ref 14–54)
ANION GAP: 8 (ref 5–15)
AST: 81 U/L — ABNORMAL HIGH (ref 15–41)
Alkaline Phosphatase: 35 U/L — ABNORMAL LOW (ref 38–126)
BILIRUBIN TOTAL: 0.4 mg/dL (ref 0.3–1.2)
BUN: 21 mg/dL — ABNORMAL HIGH (ref 6–20)
CHLORIDE: 103 mmol/L (ref 101–111)
CO2: 31 mmol/L (ref 22–32)
Calcium: 8.5 mg/dL — ABNORMAL LOW (ref 8.9–10.3)
Creatinine, Ser: 0.57 mg/dL (ref 0.44–1.00)
GFR calc Af Amer: >60 mL/min (ref >60)
Glucose, Bld: 85 mg/dL (ref 65–99)
POTASSIUM: 4.1 mmol/L (ref 3.5–5.1)
Sodium: 142 mmol/L (ref 135–145)
TOTAL PROTEIN: 5.8 g/dL — AB (ref 6.5–8.1)

## 2018-06-01 LAB — MAGNESIUM: MAGNESIUM: 2 mg/dL (ref 1.7–2.4)

## 2018-06-01 LAB — PREALBUMIN: PREALBUMIN: 17.9 mg/dL — AB (ref 18–38)

## 2018-06-01 LAB — PHOSPHORUS: PHOSPHORUS: 4.1 mg/dL (ref 2.5–4.6)

## 2018-06-01 LAB — TRIGLYCERIDES: Triglycerides: 83 mg/dL (ref <150)

## 2018-06-01 MED ORDER — LIDOCAINE HCL 1 % IJ SOLN
INTRAMUSCULAR | Status: AC
  Filled 2018-06-01: qty 20

## 2018-06-01 NOTE — Procedures (Signed)
  Procedure: R SL PICC   EBL:   minimal Complications:  none immediate  See full dictation in [facility].  Dillard Cannon MD Main # [phone] Pager  [phone]

## 2018-06-02 DIAGNOSIS — G4733 Obstructive sleep apnea (adult) (pediatric): Secondary | ICD-10-CM | POA: Diagnosis not present

## 2018-06-04 DIAGNOSIS — Z9884 Bariatric surgery status: Secondary | ICD-10-CM | POA: Diagnosis not present

## 2018-06-04 DIAGNOSIS — E440 Moderate protein-calorie malnutrition: Secondary | ICD-10-CM | POA: Diagnosis not present

## 2018-06-04 DIAGNOSIS — K912 Postsurgical malabsorption, not elsewhere classified: Secondary | ICD-10-CM | POA: Diagnosis not present

## 2018-06-04 DIAGNOSIS — R1013 Epigastric pain: Secondary | ICD-10-CM | POA: Diagnosis not present

## 2018-06-05 ENCOUNTER — Other Ambulatory Visit: Payer: Self-pay | Admitting: Family Medicine

## 2018-06-05 DIAGNOSIS — E785 Hyperlipidemia, unspecified: Secondary | ICD-10-CM

## 2018-06-05 DIAGNOSIS — E440 Moderate protein-calorie malnutrition: Secondary | ICD-10-CM | POA: Diagnosis not present

## 2018-06-07 DIAGNOSIS — E440 Moderate protein-calorie malnutrition: Secondary | ICD-10-CM | POA: Diagnosis not present

## 2018-06-07 DIAGNOSIS — K912 Postsurgical malabsorption, not elsewhere classified: Secondary | ICD-10-CM | POA: Diagnosis not present

## 2018-06-07 DIAGNOSIS — Z9884 Bariatric surgery status: Secondary | ICD-10-CM | POA: Diagnosis not present

## 2018-06-09 DIAGNOSIS — E440 Moderate protein-calorie malnutrition: Secondary | ICD-10-CM | POA: Diagnosis not present

## 2018-06-11 DIAGNOSIS — Z9884 Bariatric surgery status: Secondary | ICD-10-CM | POA: Diagnosis not present

## 2018-06-11 DIAGNOSIS — K912 Postsurgical malabsorption, not elsewhere classified: Secondary | ICD-10-CM | POA: Diagnosis not present

## 2018-06-11 DIAGNOSIS — E440 Moderate protein-calorie malnutrition: Secondary | ICD-10-CM | POA: Diagnosis not present

## 2018-06-12 ENCOUNTER — Ambulatory Visit: Payer: Self-pay | Admitting: General Surgery

## 2018-06-12 ENCOUNTER — Other Ambulatory Visit: Payer: Self-pay | Admitting: General Surgery

## 2018-06-12 ENCOUNTER — Ambulatory Visit
Admission: RE | Admit: 2018-06-12 | Discharge: 2018-06-12 | Disposition: A | Discharge status: Home / Self Care | Payer: Medicare Other | Source: Ambulatory Visit | Attending: General Surgery | Admitting: General Surgery

## 2018-06-12 ENCOUNTER — Ambulatory Visit: Payer: Medicare Other

## 2018-06-12 DIAGNOSIS — R1013 Epigastric pain: Secondary | ICD-10-CM

## 2018-06-12 DIAGNOSIS — N281 Cyst of kidney, acquired: Secondary | ICD-10-CM | POA: Diagnosis not present

## 2018-06-12 MED ORDER — IOHEXOL 300 MG/ML  SOLN
100.0000 mL | Freq: Once | INTRAMUSCULAR | Status: AC | PRN
  Administered 2018-06-12: 100 mL via INTRAVENOUS

## 2018-06-13 ENCOUNTER — Telehealth: Payer: Self-pay | Admitting: Internal Medicine

## 2018-06-13 ENCOUNTER — Ambulatory Visit (INDEPENDENT_AMBULATORY_CARE_PROVIDER_SITE_OTHER): Payer: Medicare Other | Admitting: *Deleted

## 2018-06-13 DIAGNOSIS — J9611 Chronic respiratory failure with hypoxia: Secondary | ICD-10-CM

## 2018-06-13 DIAGNOSIS — K912 Postsurgical malabsorption, not elsewhere classified: Secondary | ICD-10-CM | POA: Diagnosis not present

## 2018-06-13 DIAGNOSIS — G4733 Obstructive sleep apnea (adult) (pediatric): Secondary | ICD-10-CM

## 2018-06-13 DIAGNOSIS — Z9884 Bariatric surgery status: Secondary | ICD-10-CM | POA: Diagnosis not present

## 2018-06-13 DIAGNOSIS — E538 Deficiency of other specified B group vitamins: Secondary | ICD-10-CM | POA: Diagnosis not present

## 2018-06-13 DIAGNOSIS — E440 Moderate protein-calorie malnutrition: Secondary | ICD-10-CM | POA: Diagnosis not present

## 2018-06-13 MED ORDER — CYANOCOBALAMIN 1000 MCG/ML IJ SOLN
1000.0000 ug | INTRAMUSCULAR | Status: AC
Start: 2018-06-13 — End: 2019-05-09
  Administered 2018-06-13 – 2019-04-19 (×9): 1000 ug via INTRAMUSCULAR

## 2018-06-13 NOTE — Progress Notes (Signed)
06-01-18 Epic (CBC w/Diff)   09-21-16 (Epic) Stress Test

## 2018-06-13 NOTE — Patient Instructions (Addendum)
Morgan [name]  06/13/2018   Your procedure is scheduled on: 06-19-18   Report to [facility] Main  Entrance    Report to Admitting at 11:15 AM    Call this number if you have problems the morning of surgery [phone]    Remember: NO SOLID FOOD AFTER MIDNIGHT THE NIGHT PRIOR TO SURGERY. NOTHING BY MOUTH EXCEPT CLEAR LIQUIDS UNTIL 3 HOURS PRIOR TO SCHEDULED SURGERY. PLEASE FINISH ENSURE DRINK PER SURGEON ORDER 3 HOURS PRIOR TO SCHEDULED SURGERY TIME WHICH NEEDS TO BE COMPLETED AT 10:15 AM.      CLEAR LIQUID DIET   Foods Allowed                                                                     Foods Excluded  Coffee and tea, regular and decaf                             liquids that you cannot  Plain Jell-O in any flavor                                             see through such as: Fruit ices (not with fruit pulp)                                     milk, soups, orange juice  Iced Popsicles                                    All solid food Carbonated beverages, regular and diet                                    Cranberry, grape and apple juices Sports drinks like Gatorade Lightly seasoned clear broth or consume(fat free) Sugar, honey syrup  Sample Menu Breakfast                                Lunch                                     Supper Cranberry juice                    Beef broth                            Chicken broth Jell-O                                     Grape juice  Apple juice Coffee or tea                        Jell-O                                      Popsicle                                                Coffee or tea                        Coffee or tea  _____________________________________________________________________    Take these medicines the morning of surgery with A SIP OF WATER:Levothyroxine (Synthroid), Pantoprazole (Protonix), Sertraline (Zoloft), Divalproex (Depakote), and Gabapentin  (Neurontin). You may also bring and use your eyedrops and nasal spray as needed.                                You may not have any metal on your body including hair pins and              piercings  Do not wear jewelry, make-up, lotions, powders or perfumes, deodorant             Do not wear nail polish.  Do not shave  48 hours prior to surgery.                Do not bring valuables to the hospital. [place].  Contacts, dentures or bridgework may not be worn into surgery.  Leave suitcase in the car. After surgery it may be brought to your room.     Special Instructions: Please bring your mask and tubing for your CPAP machine              Please read over the following fact sheets you were given: _____________________________________________________________________          [facility] - Preparing for Surgery Before surgery, you can play an important role.  Because skin is not sterile, your skin needs to be as free of germs as possible.  You can reduce the number of germs on your skin by washing with CHG (chlorahexidine gluconate) soap before surgery.  CHG is an antiseptic cleaner which kills germs and bonds with the skin to continue killing germs even after washing. Please DO NOT use if you have an allergy to CHG or antibacterial soaps.  If your skin becomes reddened/irritated stop using the CHG and inform your nurse when you arrive at Short Stay. Do not shave (including legs and underarms) for at least 48 hours prior to the first CHG shower.  You may shave your face/neck. Please follow these instructions carefully:  1.  Shower with CHG Soap the night before surgery and the  morning of Surgery.  2.  If you choose to wash your hair, wash your hair first as usual with your  normal  shampoo.  3.  After you shampoo, rinse your hair and body thoroughly to remove the  shampoo.  4.  Use CHG as you would any other liquid  soap.  You can apply chg directly  to the skin and wash                       Gently with a scrungie or clean washcloth.  5.  Apply the CHG Soap to your body ONLY FROM THE NECK DOWN.   Do not use on face/ open                           Wound or open sores. Avoid contact with eyes, ears mouth and genitals (private parts).                       Wash face,  Genitals (private parts) with your normal soap.             6.  Wash thoroughly, paying special attention to the area where your surgery  will be performed.  7.  Thoroughly rinse your body with warm water from the neck down.  8.  DO NOT shower/wash with your normal soap after using and rinsing off  the CHG Soap.                9.  Pat yourself dry with a clean towel.            10.  Wear clean pajamas.            11.  Place clean sheets on your bed the night of your first shower and do not  sleep with pets. Day of Surgery : Do not apply any lotions/deodorants the morning of surgery.  Please wear clean clothes to the hospital/surgery center.  FAILURE TO FOLLOW THESE INSTRUCTIONS MAY RESULT IN THE CANCELLATION OF YOUR SURGERY PATIENT SIGNATURE_________________________________  NURSE SIGNATURE__________________________________  ________________________________________________________________________

## 2018-06-13 NOTE — Progress Notes (Signed)
Pt given Cyanocobalamin inj Tolerated well 

## 2018-06-13 NOTE — Telephone Encounter (Signed)
CY pt requesting results. I do not see any notes regarding your review and recommendations. Please advise.   Spoke with pt, advised her I would route this message to doctor Young. Pt understood.   Patient Instructions by Deneise Lever, MD at 04/30/2018 10:30 AM  Author: Deneise Lever, MD Author Type: Physician Filed: 04/30/2018 9:36 AM  Note Status: Signed Cosign: Cosign Not Required Encounter Date: 04/30/2018  Editor: Deneise Lever, MD (Physician)    Order- please schedule unattended home sleep test    Dx OSA  Please call me about 2 weeks after the study, for results and recommendations. We may be able to stop or at least reduce your CPAP.    Instructions      Return in about 3 months (around 07/31/2018).  Order- please schedule unattended home sleep test    Dx OSA  Please call me about 2 weeks after the study, for results and recommendations. We may be able to stop or at least reduce your CPAP.       After Visit Summary (Printed 04/30/2018)

## 2018-06-15 ENCOUNTER — Encounter (HOSPITAL_COMMUNITY)
Admission: RE | Admit: 2018-06-15 | Discharge: 2018-06-15 | Disposition: A | Discharge status: Home / Self Care | Payer: Medicare Other | Source: Ambulatory Visit | Attending: General Surgery | Admitting: General Surgery

## 2018-06-15 ENCOUNTER — Encounter (HOSPITAL_COMMUNITY): Payer: Self-pay | Admitting: *Deleted

## 2018-06-15 ENCOUNTER — Other Ambulatory Visit: Payer: Self-pay

## 2018-06-15 DIAGNOSIS — Z01812 Encounter for preprocedural laboratory examination: Secondary | ICD-10-CM | POA: Diagnosis not present

## 2018-06-15 DIAGNOSIS — R001 Bradycardia, unspecified: Secondary | ICD-10-CM | POA: Insufficient documentation

## 2018-06-15 DIAGNOSIS — Z01810 Encounter for preprocedural cardiovascular examination: Secondary | ICD-10-CM | POA: Diagnosis not present

## 2018-06-15 DIAGNOSIS — I10 Essential (primary) hypertension: Secondary | ICD-10-CM | POA: Diagnosis not present

## 2018-06-15 LAB — BASIC METABOLIC PANEL
ANION GAP: 7 (ref 5–15)
BUN: 26 mg/dL — ABNORMAL HIGH (ref 6–20)
CHLORIDE: 103 mmol/L (ref 98–111)
CO2: 33 mmol/L — AB (ref 22–32)
Calcium: 8.5 mg/dL — ABNORMAL LOW (ref 8.9–10.3)
Creatinine, Ser: 0.53 mg/dL (ref 0.44–1.00)
GFR calc non Af Amer: >60 mL/min (ref >60)
Glucose, Bld: 82 mg/dL (ref 70–99)
POTASSIUM: 4.5 mmol/L (ref 3.5–5.1)
Sodium: 143 mmol/L (ref 135–145)

## 2018-06-15 LAB — HEMOGLOBIN A1C
Hgb A1c MFr Bld: 5.3 % (ref 4.8–5.6)
MEAN PLASMA GLUCOSE: 105.41 mg/dL

## 2018-06-15 LAB — PREGNANCY, URINE: PREG TEST UR: NEGATIVE

## 2018-06-15 NOTE — Telephone Encounter (Signed)
Pt is calling back [phone] 

## 2018-06-15 NOTE — Telephone Encounter (Signed)
Attempted to call pt but no answer.  Left message for pt to return call x1 

## 2018-06-15 NOTE — Progress Notes (Signed)
Pt is to be NPO after midnight. Patient questioning if she should have her tube feeding which infuses for 16hrs and runs overnight. Patient advised to speak to surgeon's office to discuss. Pt verbalized understanding.

## 2018-06-15 NOTE — Progress Notes (Signed)
06-15-18 BMP result routed to Dr. Redmond Pulling for review

## 2018-06-15 NOTE — Telephone Encounter (Signed)
HST- the sleep study showed a few apneas, but was within the limits of normal, so she doesn't have obstructive sleep apnea syndrome.  We can see her here again as needed.

## 2018-06-15 NOTE — Telephone Encounter (Signed)
Called and spoke with pt letting her know the results of the HST and based on the results, pt does not have OSA.  Pt asked if the CPAP could be picked up and I stated to her I would send an order to [facility] stating for them to pick up her equipment. Also stated to pt she could follow up at our office as needed.  Pt expressed understanding and asked if I could cancel her upcoming appt with CY.  appt has been cancelled and order has been placed to [facility] to pick up pt's cpap. Nothing further needed.

## 2018-06-18 DIAGNOSIS — M5416 Radiculopathy, lumbar region: Secondary | ICD-10-CM | POA: Diagnosis not present

## 2018-06-18 DIAGNOSIS — M47816 Spondylosis without myelopathy or radiculopathy, lumbar region: Secondary | ICD-10-CM | POA: Diagnosis not present

## 2018-06-18 MED ORDER — BUPIVACAINE LIPOSOME 1.3 % IJ SUSP
20.0000 mL | INTRAMUSCULAR | Status: DC
  Filled 2018-06-18: qty 20

## 2018-06-19 ENCOUNTER — Telehealth: Payer: Self-pay

## 2018-06-19 ENCOUNTER — Inpatient Hospital Stay (HOSPITAL_COMMUNITY): Payer: Medicare Other | Admitting: Anesthesiology

## 2018-06-19 ENCOUNTER — Encounter (HOSPITAL_COMMUNITY)
Admission: RE | Disposition: A | Discharge status: Home Health | Payer: Self-pay | Source: Home / Self Care | Attending: General Surgery

## 2018-06-19 ENCOUNTER — Inpatient Hospital Stay (HOSPITAL_COMMUNITY)
Admission: RE | Admit: 2018-06-19 | Discharge: 2018-06-21 | DRG: 327 | Disposition: A | Discharge status: Home Health | Payer: Medicare Other | Attending: General Surgery | Admitting: General Surgery

## 2018-06-19 ENCOUNTER — Encounter (HOSPITAL_COMMUNITY): Payer: Self-pay | Admitting: *Deleted

## 2018-06-19 ENCOUNTER — Other Ambulatory Visit: Payer: Self-pay

## 2018-06-19 DIAGNOSIS — Z79891 Long term (current) use of opiate analgesic: Secondary | ICD-10-CM

## 2018-06-19 DIAGNOSIS — Z833 Family history of diabetes mellitus: Secondary | ICD-10-CM

## 2018-06-19 DIAGNOSIS — Z9109 Other allergy status, other than to drugs and biological substances: Secondary | ICD-10-CM

## 2018-06-19 DIAGNOSIS — Z885 Allergy status to narcotic agent status: Secondary | ICD-10-CM

## 2018-06-19 DIAGNOSIS — D649 Anemia, unspecified: Secondary | ICD-10-CM | POA: Diagnosis not present

## 2018-06-19 DIAGNOSIS — J9610 Chronic respiratory failure, unspecified whether with hypoxia or hypercapnia: Secondary | ICD-10-CM | POA: Diagnosis present

## 2018-06-19 DIAGNOSIS — K449 Diaphragmatic hernia without obstruction or gangrene: Secondary | ICD-10-CM | POA: Diagnosis not present

## 2018-06-19 DIAGNOSIS — K219 Gastro-esophageal reflux disease without esophagitis: Secondary | ICD-10-CM | POA: Diagnosis not present

## 2018-06-19 DIAGNOSIS — Z8349 Family history of other endocrine, nutritional and metabolic diseases: Secondary | ICD-10-CM

## 2018-06-19 DIAGNOSIS — G43709 Chronic migraine without aura, not intractable, without status migrainosus: Secondary | ICD-10-CM | POA: Diagnosis present

## 2018-06-19 DIAGNOSIS — Z9884 Bariatric surgery status: Secondary | ICD-10-CM | POA: Diagnosis not present

## 2018-06-19 DIAGNOSIS — E441 Mild protein-calorie malnutrition: Secondary | ICD-10-CM | POA: Diagnosis present

## 2018-06-19 DIAGNOSIS — I10 Essential (primary) hypertension: Secondary | ICD-10-CM | POA: Diagnosis present

## 2018-06-19 DIAGNOSIS — E785 Hyperlipidemia, unspecified: Secondary | ICD-10-CM | POA: Diagnosis not present

## 2018-06-19 DIAGNOSIS — E538 Deficiency of other specified B group vitamins: Secondary | ICD-10-CM | POA: Diagnosis present

## 2018-06-19 DIAGNOSIS — Z9981 Dependence on supplemental oxygen: Secondary | ICD-10-CM | POA: Diagnosis not present

## 2018-06-19 DIAGNOSIS — E039 Hypothyroidism, unspecified: Secondary | ICD-10-CM | POA: Diagnosis not present

## 2018-06-19 DIAGNOSIS — E46 Unspecified protein-calorie malnutrition: Secondary | ICD-10-CM | POA: Diagnosis not present

## 2018-06-19 DIAGNOSIS — R6889 Other general symptoms and signs: Secondary | ICD-10-CM | POA: Diagnosis present

## 2018-06-19 DIAGNOSIS — E1143 Type 2 diabetes mellitus with diabetic autonomic (poly)neuropathy: Secondary | ICD-10-CM | POA: Diagnosis not present

## 2018-06-19 DIAGNOSIS — J309 Allergic rhinitis, unspecified: Secondary | ICD-10-CM | POA: Diagnosis present

## 2018-06-19 DIAGNOSIS — Z9049 Acquired absence of other specified parts of digestive tract: Secondary | ICD-10-CM

## 2018-06-19 DIAGNOSIS — K3184 Gastroparesis: Secondary | ICD-10-CM | POA: Diagnosis not present

## 2018-06-19 DIAGNOSIS — Z888 Allergy status to other drugs, medicaments and biological substances status: Secondary | ICD-10-CM

## 2018-06-19 DIAGNOSIS — E559 Vitamin D deficiency, unspecified: Secondary | ICD-10-CM | POA: Diagnosis present

## 2018-06-19 DIAGNOSIS — K5909 Other constipation: Secondary | ICD-10-CM | POA: Diagnosis not present

## 2018-06-19 DIAGNOSIS — R1013 Epigastric pain: Secondary | ICD-10-CM | POA: Diagnosis present

## 2018-06-19 DIAGNOSIS — G4733 Obstructive sleep apnea (adult) (pediatric): Secondary | ICD-10-CM | POA: Diagnosis present

## 2018-06-19 DIAGNOSIS — M170 Bilateral primary osteoarthritis of knee: Secondary | ICD-10-CM | POA: Diagnosis not present

## 2018-06-19 DIAGNOSIS — Z79899 Other long term (current) drug therapy: Secondary | ICD-10-CM

## 2018-06-19 DIAGNOSIS — F319 Bipolar disorder, unspecified: Secondary | ICD-10-CM | POA: Diagnosis present

## 2018-06-19 DIAGNOSIS — Z79890 Hormone replacement therapy (postmenopausal): Secondary | ICD-10-CM

## 2018-06-19 DIAGNOSIS — K2270 Barrett's esophagus without dysplasia: Secondary | ICD-10-CM | POA: Diagnosis present

## 2018-06-19 DIAGNOSIS — Z8249 Family history of ischemic heart disease and other diseases of the circulatory system: Secondary | ICD-10-CM

## 2018-06-19 HISTORY — PX: GASTROSTOMY: SHX5249

## 2018-06-19 HISTORY — PX: LAPAROSCOPY: SHX197

## 2018-06-19 HISTORY — PX: HIATAL HERNIA REPAIR: SHX195

## 2018-06-19 LAB — GLUCOSE, CAPILLARY
GLUCOSE-CAPILLARY: 158 mg/dL — AB (ref 70–99)
Glucose-Capillary: 90 mg/dL (ref 70–99)

## 2018-06-19 SURGERY — LAPAROSCOPY, DIAGNOSTIC
Anesthesia: General

## 2018-06-19 MED ORDER — STERILE WATER FOR IRRIGATION IR SOLN
Status: DC | PRN
  Administered 2018-06-19: 7 mL

## 2018-06-19 MED ORDER — PROMETHAZINE HCL 25 MG/ML IJ SOLN
INTRAMUSCULAR | Status: AC
  Filled 2018-06-19: qty 1

## 2018-06-19 MED ORDER — DEXAMETHASONE SODIUM PHOSPHATE 10 MG/ML IJ SOLN
INTRAMUSCULAR | Status: DC | PRN
  Administered 2018-06-19: 10 mg via INTRAVENOUS

## 2018-06-19 MED ORDER — GABAPENTIN 300 MG PO CAPS
600.0000 mg | ORAL_CAPSULE | Freq: Two times a day (BID) | ORAL | Status: DC
  Administered 2018-06-19 – 2018-06-21 (×4): 600 mg via ORAL
  Filled 2018-06-19 (×4): qty 2

## 2018-06-19 MED ORDER — LACTATED RINGERS IV SOLN
INTRAVENOUS | Status: DC
  Administered 2018-06-19 (×2): via INTRAVENOUS

## 2018-06-19 MED ORDER — MIDAZOLAM HCL 5 MG/5ML IJ SOLN
INTRAMUSCULAR | Status: DC | PRN
  Administered 2018-06-19 (×2): 1 mg via INTRAVENOUS

## 2018-06-19 MED ORDER — PROMETHAZINE HCL 25 MG/ML IJ SOLN: 12.5000 mg | Freq: Four times a day (QID) | INTRAMUSCULAR | Status: DC | PRN

## 2018-06-19 MED ORDER — ACETAMINOPHEN 500 MG PO TABS
1000.0000 mg | ORAL_TABLET | ORAL | Status: AC
  Administered 2018-06-19: 1000 mg via ORAL
  Filled 2018-06-19: qty 2

## 2018-06-19 MED ORDER — FENTANYL CITRATE (PF) 100 MCG/2ML IJ SOLN
25.0000 ug | INTRAMUSCULAR | Status: DC | PRN
  Administered 2018-06-19: 50 ug via INTRAVENOUS

## 2018-06-19 MED ORDER — FENTANYL CITRATE (PF) 100 MCG/2ML IJ SOLN
INTRAMUSCULAR | Status: AC
  Filled 2018-06-19: qty 4

## 2018-06-19 MED ORDER — FENTANYL CITRATE (PF) 250 MCG/5ML IJ SOLN
INTRAMUSCULAR | Status: AC
  Filled 2018-06-19: qty 5

## 2018-06-19 MED ORDER — BUPIVACAINE-EPINEPHRINE 0.25% -1:200000 IJ SOLN
INTRAMUSCULAR | Status: DC | PRN
Start: 2018-06-19 — End: 2018-06-19
  Administered 2018-06-19: 30 mL

## 2018-06-19 MED ORDER — GLYCOPYRROLATE 0.2 MG/ML IJ SOLN
INTRAMUSCULAR | Status: DC | PRN
  Administered 2018-06-19: 0.2 mg via INTRAVENOUS

## 2018-06-19 MED ORDER — LEVOTHYROXINE SODIUM 25 MCG PO TABS
125.0000 ug | ORAL_TABLET | Freq: Every day | ORAL | Status: DC
  Administered 2018-06-20 – 2018-06-21 (×2): 125 ug via ORAL
  Filled 2018-06-19 (×2): qty 1

## 2018-06-19 MED ORDER — DEXAMETHASONE SODIUM PHOSPHATE 10 MG/ML IJ SOLN
INTRAMUSCULAR | Status: AC
  Filled 2018-06-19: qty 1

## 2018-06-19 MED ORDER — OLOPATADINE HCL 0.7 % OP SOLN: 1.0000 [drp] | Freq: Every morning | OPHTHALMIC | Status: DC

## 2018-06-19 MED ORDER — LORATADINE 10 MG PO TABS: 10.0000 mg | ORAL_TABLET | Freq: Every day | ORAL | Status: DC | PRN

## 2018-06-19 MED ORDER — GABAPENTIN 300 MG PO CAPS
300.0000 mg | ORAL_CAPSULE | ORAL | Status: DC
  Filled 2018-06-19: qty 1

## 2018-06-19 MED ORDER — ACETAMINOPHEN 160 MG/5ML PO SOLN
650.0000 mg | Freq: Four times a day (QID) | ORAL | Status: DC
  Administered 2018-06-19 – 2018-06-21 (×7): 650 mg via ORAL
  Filled 2018-06-19 (×7): qty 20.3

## 2018-06-19 MED ORDER — ONDANSETRON HCL 4 MG/2ML IJ SOLN
INTRAMUSCULAR | Status: DC | PRN
  Administered 2018-06-19: 4 mg via INTRAVENOUS

## 2018-06-19 MED ORDER — ZIPRASIDONE HCL 60 MG PO CAPS
60.0000 mg | ORAL_CAPSULE | Freq: Every day | ORAL | Status: DC
  Administered 2018-06-19 – 2018-06-20 (×2): 60 mg via ORAL
  Filled 2018-06-19 (×2): qty 1

## 2018-06-19 MED ORDER — ONDANSETRON HCL 4 MG/2ML IJ SOLN
INTRAMUSCULAR | Status: AC
  Filled 2018-06-19: qty 2

## 2018-06-19 MED ORDER — SERTRALINE HCL 100 MG PO TABS
200.0000 mg | ORAL_TABLET | Freq: Every day | ORAL | Status: DC
  Administered 2018-06-20 – 2018-06-21 (×2): 200 mg via ORAL
  Filled 2018-06-19 (×2): qty 2

## 2018-06-19 MED ORDER — FENTANYL CITRATE (PF) 100 MCG/2ML IJ SOLN
25.0000 ug | INTRAMUSCULAR | Status: DC | PRN
  Administered 2018-06-21: 25 ug via INTRAVENOUS
  Filled 2018-06-19: qty 2

## 2018-06-19 MED ORDER — CHLORHEXIDINE GLUCONATE CLOTH 2 % EX PADS: 6.0000 | MEDICATED_PAD | Freq: Once | CUTANEOUS | Status: DC

## 2018-06-19 MED ORDER — ENOXAPARIN SODIUM 30 MG/0.3ML ~~LOC~~ SOLN
30.0000 mg | Freq: Two times a day (BID) | SUBCUTANEOUS | Status: DC
  Administered 2018-06-19: 30 mg via SUBCUTANEOUS
  Filled 2018-06-19: qty 0.3

## 2018-06-19 MED ORDER — EPHEDRINE SULFATE 50 MG/ML IJ SOLN
INTRAMUSCULAR | Status: DC | PRN
  Administered 2018-06-19 (×2): 10 mg via INTRAVENOUS
  Administered 2018-06-19 (×2): 5 mg via INTRAVENOUS

## 2018-06-19 MED ORDER — PROPOFOL 10 MG/ML IV BOLUS
INTRAVENOUS | Status: AC
  Filled 2018-06-19: qty 20

## 2018-06-19 MED ORDER — SUGAMMADEX SODIUM 200 MG/2ML IV SOLN
INTRAVENOUS | Status: AC
  Filled 2018-06-19: qty 2

## 2018-06-19 MED ORDER — LIDOCAINE 2% (20 MG/ML) 5 ML SYRINGE
INTRAMUSCULAR | Status: DC | PRN
  Administered 2018-06-19: 50 mg via INTRAVENOUS

## 2018-06-19 MED ORDER — 0.9 % SODIUM CHLORIDE (POUR BTL) OPTIME
TOPICAL | Status: DC | PRN
  Administered 2018-06-19: 1000 mL

## 2018-06-19 MED ORDER — BUPIVACAINE-EPINEPHRINE (PF) 0.25% -1:200000 IJ SOLN
INTRAMUSCULAR | Status: AC
  Filled 2018-06-19: qty 30

## 2018-06-19 MED ORDER — DIVALPROEX SODIUM 250 MG PO DR TAB
500.0000 mg | DELAYED_RELEASE_TABLET | Freq: Every morning | ORAL | Status: DC
  Administered 2018-06-20 – 2018-06-21 (×2): 500 mg via ORAL
  Filled 2018-06-19 (×2): qty 2

## 2018-06-19 MED ORDER — PREMIER PROTEIN SHAKE
2.0000 [oz_av] | ORAL | Status: DC
  Administered 2018-06-20 – 2018-06-21 (×9): 2 [oz_av] via ORAL

## 2018-06-19 MED ORDER — DIVALPROEX SODIUM 250 MG PO DR TAB
1000.0000 mg | DELAYED_RELEASE_TABLET | Freq: Every day | ORAL | Status: DC
  Administered 2018-06-19 – 2018-06-20 (×2): 1000 mg via ORAL
  Filled 2018-06-19 (×2): qty 4

## 2018-06-19 MED ORDER — FENTANYL CITRATE (PF) 100 MCG/2ML IJ SOLN
INTRAMUSCULAR | Status: AC
  Filled 2018-06-19: qty 2

## 2018-06-19 MED ORDER — PROMETHAZINE HCL 25 MG/ML IJ SOLN
6.2500 mg | INTRAMUSCULAR | Status: DC | PRN
  Administered 2018-06-19: 12.5 mg via INTRAVENOUS

## 2018-06-19 MED ORDER — LIDOCAINE 2% (20 MG/ML) 5 ML SYRINGE
INTRAMUSCULAR | Status: AC
  Filled 2018-06-19: qty 5

## 2018-06-19 MED ORDER — DICLOFENAC SODIUM 1 % TD GEL
2.0000 g | Freq: Two times a day (BID) | TRANSDERMAL | Status: DC | PRN
  Filled 2018-06-19: qty 100

## 2018-06-19 MED ORDER — TIZANIDINE HCL 4 MG PO TABS: 2.0000 mg | ORAL_TABLET | Freq: Three times a day (TID) | ORAL | Status: DC | PRN

## 2018-06-19 MED ORDER — PROPOFOL 10 MG/ML IV BOLUS
INTRAVENOUS | Status: DC | PRN
  Administered 2018-06-19: 120 mg via INTRAVENOUS

## 2018-06-19 MED ORDER — KCL IN DEXTROSE-NACL 20-5-0.45 MEQ/L-%-% IV SOLN
INTRAVENOUS | Status: DC
  Administered 2018-06-19: 1000 mL via INTRAVENOUS
  Administered 2018-06-20 – 2018-06-21 (×2): via INTRAVENOUS
  Filled 2018-06-19 (×3): qty 1000

## 2018-06-19 MED ORDER — FAMOTIDINE IN NACL 20-0.9 MG/50ML-% IV SOLN
20.0000 mg | Freq: Two times a day (BID) | INTRAVENOUS | Status: DC
  Administered 2018-06-19 – 2018-06-21 (×4): 20 mg via INTRAVENOUS
  Filled 2018-06-19 (×4): qty 50

## 2018-06-19 MED ORDER — OLOPATADINE HCL 0.1 % OP SOLN
1.0000 [drp] | Freq: Every day | OPHTHALMIC | Status: DC
  Administered 2018-06-20 – 2018-06-21 (×2): 1 [drp] via OPHTHALMIC
  Filled 2018-06-19: qty 5

## 2018-06-19 MED ORDER — BUPIVACAINE LIPOSOME 1.3 % IJ SUSP
INTRAMUSCULAR | Status: DC | PRN
  Administered 2018-06-19: 20 mL

## 2018-06-19 MED ORDER — CEFAZOLIN SODIUM-DEXTROSE 2-4 GM/100ML-% IV SOLN
2.0000 g | INTRAVENOUS | Status: AC
  Administered 2018-06-19: 2 g via INTRAVENOUS
  Filled 2018-06-19: qty 100

## 2018-06-19 MED ORDER — LACTATED RINGERS IR SOLN
Status: DC | PRN
  Administered 2018-06-19: 1000 mL

## 2018-06-19 MED ORDER — PHENYLEPHRINE HCL-NACL 10-0.9 MG/250ML-% IV SOLN
INTRAVENOUS | Status: AC
  Filled 2018-06-19: qty 250

## 2018-06-19 MED ORDER — FENTANYL CITRATE (PF) 250 MCG/5ML IJ SOLN
INTRAMUSCULAR | Status: DC | PRN
  Administered 2018-06-19 (×4): 50 ug via INTRAVENOUS

## 2018-06-19 MED ORDER — ROCURONIUM BROMIDE 10 MG/ML (PF) SYRINGE
PREFILLED_SYRINGE | INTRAVENOUS | Status: DC | PRN
  Administered 2018-06-19 (×2): 10 mg via INTRAVENOUS
  Administered 2018-06-19: 40 mg via INTRAVENOUS
  Administered 2018-06-19 (×4): 10 mg via INTRAVENOUS

## 2018-06-19 MED ORDER — SIMETHICONE 80 MG PO CHEW
80.0000 mg | CHEWABLE_TABLET | Freq: Four times a day (QID) | ORAL | Status: DC | PRN
  Administered 2018-06-20 – 2018-06-21 (×3): 80 mg via ORAL
  Filled 2018-06-19 (×3): qty 1

## 2018-06-19 MED ORDER — ONDANSETRON HCL 4 MG/2ML IJ SOLN
4.0000 mg | Freq: Four times a day (QID) | INTRAMUSCULAR | Status: DC | PRN
  Administered 2018-06-20: 4 mg via INTRAVENOUS
  Filled 2018-06-19: qty 2

## 2018-06-19 MED ORDER — SUGAMMADEX SODIUM 200 MG/2ML IV SOLN
INTRAVENOUS | Status: DC | PRN
  Administered 2018-06-19: 150 mg via INTRAVENOUS

## 2018-06-19 MED ORDER — MIDAZOLAM HCL 2 MG/2ML IJ SOLN
INTRAMUSCULAR | Status: AC
  Filled 2018-06-19: qty 2

## 2018-06-19 MED ORDER — TRAMADOL HCL 50 MG PO TABS
50.0000 mg | ORAL_TABLET | Freq: Four times a day (QID) | ORAL | Status: DC
  Administered 2018-06-19 – 2018-06-21 (×6): 50 mg via ORAL
  Filled 2018-06-19 (×6): qty 1

## 2018-06-19 SURGICAL SUPPLY — 95 items
ADH SKN CLS APL DERMABOND .7 (GAUZE/BANDAGES/DRESSINGS) ×1
APPLIER CLIP 5 13 M/L LIGAMAX5 (MISCELLANEOUS)
APPLIER CLIP ROT 10 11.4 M/L (STAPLE)
APR CLP MED LRG 11.4X10 (STAPLE)
APR CLP MED LRG 5 ANG JAW (MISCELLANEOUS)
BAG URINE DRAINAGE (UROLOGICAL SUPPLIES) ×1 IMPLANT
BLADE 15 SAFETY STRL DISP (BLADE) ×2 IMPLANT
BLADE EXTENDED COATED 6.5IN (ELECTRODE) IMPLANT
BLADE SURG SZ10 CARB STEEL (BLADE) IMPLANT
CABLE HIGH FREQUENCY MONO STRZ (ELECTRODE) ×2 IMPLANT
CELLS DAT CNTRL 66122 CELL SVR (MISCELLANEOUS) IMPLANT
CHLORAPREP W/TINT 26ML (MISCELLANEOUS) ×5 IMPLANT
CLIP APPLIE 5 13 M/L LIGAMAX5 (MISCELLANEOUS) IMPLANT
CLIP APPLIE ROT 10 11.4 M/L (STAPLE) IMPLANT
COVER MAYO STAND STRL (DRAPES) IMPLANT
COVER SURGICAL LIGHT HANDLE (MISCELLANEOUS) ×3 IMPLANT
DECANTER SPIKE VIAL GLASS SM (MISCELLANEOUS) ×3 IMPLANT
DERMABOND ADVANCED (GAUZE/BANDAGES/DRESSINGS) ×2
DERMABOND ADVANCED .7 DNX12 (GAUZE/BANDAGES/DRESSINGS) IMPLANT
DEVICE SUT QUICK LOAD TK 5 (STAPLE) ×3 IMPLANT
DEVICE SUT TI-KNOT TK 5X26 (MISCELLANEOUS) ×1 IMPLANT
DEVICE SUTURE ENDOST 10MM (ENDOMECHANICALS) ×2 IMPLANT
DEVICE TI KNOT TK5 (MISCELLANEOUS) ×1
DISSECTOR BLUNT TIP ENDO 5MM (MISCELLANEOUS) ×2 IMPLANT
DRAIN PENROSE 18X1/2 LTX STRL (DRAIN) ×2 IMPLANT
DRAPE LAPAROSCOPIC ABDOMINAL (DRAPES) ×3 IMPLANT
DRAPE SHEET LG 3/4 BI-LAMINATE (DRAPES) IMPLANT
DRAPE WARM FLUID 44X44 (DRAPE) ×1 IMPLANT
ELECT PENCIL ROCKER SW 15FT (MISCELLANEOUS) IMPLANT
ELECT REM PT RETURN 15FT ADLT (MISCELLANEOUS) ×3 IMPLANT
GAUZE SPONGE 4X4 12PLY STRL (GAUZE/BANDAGES/DRESSINGS) ×1 IMPLANT
GLOVE BIO SURGEON STRL SZ7.5 (GLOVE) ×3 IMPLANT
GLOVE BIOGEL M STRL SZ7.5 (GLOVE) ×3 IMPLANT
GLOVE BIOGEL PI IND STRL 6.5 (GLOVE) IMPLANT
GLOVE BIOGEL PI IND STRL 7.0 (GLOVE) IMPLANT
GLOVE BIOGEL PI INDICATOR 6.5 (GLOVE) ×4
GLOVE BIOGEL PI INDICATOR 7.0 (GLOVE) ×8
GLOVE INDICATOR 8.0 STRL GRN (GLOVE) ×3 IMPLANT
GLOVE SURG SS PI 7.0 STRL IVOR (GLOVE) ×4 IMPLANT
GOWN STRL REUS W/TWL XL LVL3 (GOWN DISPOSABLE) ×14 IMPLANT
GRASPER SUT TROCAR 14GX15 (MISCELLANEOUS) ×2 IMPLANT
HANDLE SUCTION POOLE (INSTRUMENTS) IMPLANT
IRRIG SUCT STRYKERFLOW 2 WTIP (MISCELLANEOUS) ×3
IRRIGATION SUCT STRKRFLW 2 WTP (MISCELLANEOUS) IMPLANT
KIT BASIN OR (CUSTOM PROCEDURE TRAY) ×3 IMPLANT
KIT INTRO ENDO 22F F/GAST TUBE (SET/KITS/TRAYS/PACK) IMPLANT
KIT INTRODUCER MIC G-18 (SET/KITS/TRAYS/PACK) ×3 IMPLANT
MIC BOLUS GASTROSTOMY FEEDING TUBE 18F ×2 IMPLANT
NEEDLE HYPO 22GX1.5 SAFETY (NEEDLE) IMPLANT
PACK GENERAL/GYN (CUSTOM PROCEDURE TRAY) ×3 IMPLANT
QUICK LOAD TK 5 (STAPLE) ×3
RETRACTOR WND ALEXIS 18 MED (MISCELLANEOUS) IMPLANT
RTRCTR WOUND ALEXIS 18CM MED (MISCELLANEOUS)
SCISSORS LAP 5X35 DISP (ENDOMECHANICALS) ×3 IMPLANT
SHEARS CURVED HARMONIC AC 45CM (MISCELLANEOUS) ×2 IMPLANT
SHEARS HARMONIC ACE PLUS 36CM (ENDOMECHANICALS) ×2 IMPLANT
SLEEVE XCEL OPT CAN 5 100 (ENDOMECHANICALS) ×7 IMPLANT
SPONGE DRAIN TRACH 4X4 STRL 2S (GAUZE/BANDAGES/DRESSINGS) ×3 IMPLANT
SPONGE LAP 18X18 RF (DISPOSABLE) ×2 IMPLANT
STAPLER VISISTAT 35W (STAPLE) ×2 IMPLANT
SUCTION POOLE HANDLE (INSTRUMENTS)
SUT ETHILON 2 0 PS N (SUTURE) ×3 IMPLANT
SUT ETHILON 3 0 PS 1 (SUTURE) ×2 IMPLANT
SUT MNCRL AB 4-0 PS2 18 (SUTURE) ×5 IMPLANT
SUT PDS AB 1 CT1 27 (SUTURE) IMPLANT
SUT PDS AB 1 TP1 96 (SUTURE) IMPLANT
SUT PROLENE 2 0 BLUE (SUTURE) IMPLANT
SUT PROLENE 2 0 KS (SUTURE) IMPLANT
SUT PROLENE 2 0 SH DA (SUTURE) IMPLANT
SUT SILK 0 SH 30 (SUTURE) ×2 IMPLANT
SUT SILK 2 0 (SUTURE)
SUT SILK 2 0 SH (SUTURE) ×10 IMPLANT
SUT SILK 2 0 SH CR/8 (SUTURE) IMPLANT
SUT SILK 2-0 18XBRD TIE 12 (SUTURE) IMPLANT
SUT SILK 3 0 (SUTURE)
SUT SILK 3 0 SH CR/8 (SUTURE) IMPLANT
SUT SILK 3-0 18XBRD TIE 12 (SUTURE) IMPLANT
SUT SURGIDAC NAB ES-9 0 48 120 (SUTURE) ×6 IMPLANT
SUT VIC AB 0 UR5 27 (SUTURE) ×4 IMPLANT
SYR 20CC LL (SYRINGE) ×3 IMPLANT
SYR BULB IRRIGATION 50ML (SYRINGE) IMPLANT
SYRINGE IRR TOOMEY STRL 70CC (SYRINGE) ×2 IMPLANT
SYS LAPSCP GELPORT 120MM (MISCELLANEOUS)
SYSTEM LAPSCP GELPORT 120MM (MISCELLANEOUS) IMPLANT
TOWEL OR 17X26 10 PK STRL BLUE (TOWEL DISPOSABLE) ×3 IMPLANT
TOWEL OR NON WOVEN STRL DISP B (DISPOSABLE) ×5 IMPLANT
TRAY LAPAROSCOPIC (CUSTOM PROCEDURE TRAY) ×3 IMPLANT
TROCAR BLADELESS OPT 5 100 (ENDOMECHANICALS) ×3 IMPLANT
TROCAR XCEL NON-BLD 11X100MML (ENDOMECHANICALS) IMPLANT
TUBE CALIBRATION LAPBAND (TUBING) ×2 IMPLANT
TUBE GASTROSTOMY 18F (CATHETERS) ×2 IMPLANT
TUBING CONNECTING 10 (TUBING) ×2 IMPLANT
TUBING CONNECTING 10' (TUBING) ×2
YANKAUER SUCT BULB TIP 10FT TU (MISCELLANEOUS) IMPLANT
YANKAUER SUCT BULB TIP NO VENT (SUCTIONS) IMPLANT

## 2018-06-19 NOTE — Anesthesia Preprocedure Evaluation (Signed)
Anesthesia Evaluation  Patient identified by MRN, date of birth, ID band Patient awake    Reviewed: Allergy & Precautions, NPO status , Patient's Chart, lab work & pertinent test results  Airway Mallampati: II  TM Distance: >3 FB Neck ROM: Full    Dental  (+) Dental Advisory Given   Pulmonary sleep apnea ,    breath sounds clear to auscultation       Cardiovascular hypertension,  Rhythm:Regular Rate:Normal     Neuro/Psych  Headaches, Bipolar Disorder    GI/Hepatic Neg liver ROS, GERD  ,S/p gastric bypass with epigastric pain and malnutrition   Endo/Other  diabetesHypothyroidism   Renal/GU negative Renal ROS     Musculoskeletal  (+) Arthritis ,   Abdominal   Peds  Hematology negative hematology ROS (+)   Anesthesia Other Findings   Reproductive/Obstetrics                             Lab Results  Component Value Date   WBC 2.8 (L) 06/01/2018   HGB 11.4 (L) 06/01/2018   HCT 34.4 (L) 06/01/2018   MCV 93.2 06/01/2018   PLT 167 06/01/2018   Lab Results  Component Value Date   CREATININE 0.53 06/15/2018   BUN 26 (H) 06/15/2018   NA 143 06/15/2018   K 4.5 06/15/2018   CL 103 06/15/2018   CO2 33 (H) 06/15/2018    Anesthesia Physical Anesthesia Plan  ASA: II  Anesthesia Plan: General   Post-op Pain Management:    Induction: Intravenous  PONV Risk Score and Plan: 3 and Dexamethasone, Ondansetron, Scopolamine patch - Pre-op and Treatment may vary due to age or medical condition  Airway Management Planned: Oral ETT  Additional Equipment:   Intra-op Plan:   Post-operative Plan: Extubation in OR  Informed Consent: I have reviewed the patients History and Physical, chart, labs and discussed the procedure including the risks, benefits and alternatives for the proposed anesthesia with the patient or authorized representative who has indicated his/her understanding and acceptance.    Dental advisory given  Plan Discussed with: CRNA  Anesthesia Plan Comments:         Anesthesia Quick Evaluation

## 2018-06-19 NOTE — Telephone Encounter (Signed)
Request Reference Number: [id number]. AIMOVIG INJ 140MG /ML is approved through 12/11/2018.   Pharmacy made aware.

## 2018-06-19 NOTE — Telephone Encounter (Signed)
We received a prior authorization request for the Aimovig 140mg /mL. I have completed and submitted the PA on Cover My Meds and should have a determination within 48-72 hours.  Cover My Meds Key: [id number]

## 2018-06-19 NOTE — H&P (Signed)
Morgan [name] is an 52 y.o. female.   Chief Complaint: Abdominal pain HPI: 52 year old Caucasian female status post laparoscopic Roux-en-Y gastric bypass April 2018 has had ongoing epigastric pain for the past several months.  She had epigastric postprandial pain back in November 2018 and was diagnosed with a marginal ulcer on endoscopy by her gastroenterologist.  She underwent medical management as well as removal of some staples.  Repeat upper endoscopy in early winter 2019 showed resolution of the ulcer and her pain resolved.  However a few months ago she started having recurrent epigastric pain mainly postprandial within 10 to 15 minutes of eating.  CT scan at outside hospital was unremarkable.  She underwent an upper GI which was also unremarkable.  She started having pain in her upper abdomen even without oral intake.  Her nutrition started to suffer so she underwent placement of a PICC line and started on TPN.  She got down to a weight of 121 pounds.  She had a repeat upper endoscopy which was unremarkable per her gastroenterologist.  I repeated her CT scan of her abdomen and there is no evidence of abnormality.  She still has ongoing epigastric pain even without oral intake.  At this point I recommended diagnostic laparoscopy with placement of a gastrostomy tube in her excluded stomach.  Since starting the TPN she has gained approximately 15 pounds.  She denies any fevers or chills.  She denies any melena or hematochezia.  She denies any hematemesis.  She denies any NSAID use.  Recent sleep study was negative and showed resolution of her obstructive sleep apnea.  She has been having back issues and has upcoming back surgery in late summer 2019  Past Medical History:  Diagnosis Date  . Allergic rhinitis   . Barrett's esophagus   . Bipolar affective disorder ([place])   . Chronic respiratory failure ([place])   . Constipation, chronic   . Diabetes mellitus without complication ([place])    type 2   .  Dry eye   . Gastroparesis   . GERD (gastroesophageal reflux disease)   . History of bariatric surgery 03/2017  . Hyperlipidemia   . Hypertension   . Intractable chronic migraine without aura 06/04/2015  . Migraine headache   . Morbid obesity ([place])   . OSA (obstructive sleep apnea)    uses a cpap with 2 units o2   . Osteoarthritis    bilateral knee  . Sleep apnea    has c-pap  . Unspecified hypothyroidism   . Vitamin B 12 deficiency   . Vitamin D deficiency     Past Surgical History:  Procedure Laterality Date  . APPENDECTOMY  1978  . CHOLECYSTECTOMY  2005  . GASTRIC ROUX-EN-Y N/A 03/21/2017   Procedure: LAPAROSCOPIC ROUX-EN-Y GASTRIC, UPPER ENDO;  Surgeon: Greer Pickerel, MD;  Location: [facility];  Service: General;  Laterality: N/A;  . HERNIA REPAIR    . SHOULDER ARTHROSCOPY  7/12   left-dsc  . TONSILLECTOMY  at age 70  . TRIGGER FINGER RELEASE  12/20/2012   Procedure: RELEASE TRIGGER FINGER/A-1 PULLEY;  Surgeon: Tennis Must, MD;  Location: [facility];  Service: Orthopedics;  Laterality: Left;  LEFT TRIGGER THUMB RELEASE    Family History  Problem Relation Age of Onset  . Asthma Father   . Allergies Father   . Heart disease Father        enlarged heart  . Peripheral vascular disease Father   . Diabetes Father   .  Hyperlipidemia Father   . Arthritis Father   . Asthma Sister   . Cancer Sister        colon at 26 yr old.  . Colon cancer Sister   . Allergies Mother   . Stroke Mother 7       with hemi paralysis  . Diabetes Mother   . Hyperlipidemia Mother   . Hypertension Mother   . GI problems Mother   . Arthritis Mother   . Allergies Brother   . Early death Brother 9       congenital abormality  . Allergies Sister   . Diabetes Sister   . Asthma Sister   . Colon polyps Sister   . Hyperlipidemia Sister   . GI problems Sister        gastroporesis   . Liver disease Sister        fatty liver  . Stroke Sister        intercrandial bleed  . Diabetes  Brother   . Hypertension Brother   . Hyperlipidemia Brother   . Diabetes Daughter   . Hyperlipidemia Daughter   . Hypertension Daughter   . Heart disease Paternal Aunt   . Migraines Neg Hx    Social History:  reports that she has never smoked. She has never used smokeless tobacco. She reports that she does not drink alcohol or use drugs.  Allergies:  Allergies  Allergen Reactions  . Ketoconazole     REACTION: hives, swelling  . Pravachol [Pravastatin Sodium] Shortness Of Breath, Swelling and Anaphylaxis    Throat swelling  . Tape Dermatitis and Other (See Comments)  . Codeine     REACTION: increased BP  . Statins     REACTION: leg crapms    Facility-Administered Medications Prior to Admission  Medication Dose Route Frequency Provider Last Rate Last Dose  . cyanocobalamin ((VITAMIN B-12)) injection 1,000 mcg  1,000 mcg Intramuscular Q30 days Ronnie Doss M, DO   1,000 mcg at 06/13/18 9509   Medications Prior to Admission  Medication Sig Dispense Refill  . diclofenac sodium (VOLTAREN) 1 % GEL Apply 2 g topically 2 (two) times daily as needed (JOINT PAIN). Use as directed for knee pain    . divalproex (DEPAKOTE) 500 MG DR tablet TAKE 1 TABLET EVERY MORNING AND 2 TABLETS AT BEDTIME 90 tablet 5  . Erenumab-aooe (AIMOVIG 140 DOSE) 70 MG/ML SOAJ Inject 140 mg into the skin every 30 (thirty) days. 2 pen 4  . ezetimibe (ZETIA) 10 MG tablet TAKE 1 TABLET ONCE DAILY 30 tablet 5  . fluticasone (FLONASE) 50 MCG/ACT nasal spray SPRAY 1 SPRAY IN EACH NOSTRIL TWICE DAILY. SHAKE GENTLY BEFORE EACH [place] E. (Patient taking differently: SPRAY 1 SPRAY IN EACH NOSTRIL DAILY AS NEEDED . SHAKE GENTLY BEFORE EACH [place] E.) 16 g 5  . gabapentin (NEURONTIN) 300 MG capsule Take 2 capsules (600 mg total) 2 (two) times daily by mouth. 360 capsule 3  . levothyroxine (SYNTHROID, LEVOTHROID) 125 MCG tablet Take 125 mcg by mouth daily before breakfast.    . Multiple Vitamin (MULTIVITAMIN) capsule Take 1 capsule  by mouth daily.     Marland Kitchen nystatin (NYAMYC) powder APPLY TOPICALLY TWICE A DAY AS NEEDED 30 g 0  . ONE TOUCH ULTRA TEST test strip CHECK BLOOD SUGAR TWICE A DAY AS DIRECTED 200 each 3  . pantoprazole (PROTONIX) 40 MG tablet Take 1 tablet by mouth daily.     Marland Kitchen PAZEO 0.7 % SOLN Place 1 drop into both  eyes every morning.    . polyethylene glycol (MIRALAX / GLYCOLAX) packet Take 17 g by mouth daily as needed for mild constipation.     . Probiotic Product (HEALTHY COLON PO) Take 1 capsule by mouth daily.     . RESTASIS MULTIDOSE 0.05 % ophthalmic emulsion Place 1 drop into both eyes 2 (two) times daily.    . sertraline (ZOLOFT) 100 MG tablet TAKE (2) TABLETS DAILY (Patient taking differently: Take 200 mg by mouth daily. TAKE (2) TABLETS DAILY) 180 tablet 3  . tiZANidine (ZANAFLEX) 2 MG tablet Take 1 tablet (2 mg total) by mouth 3 (three) times daily. (Patient taking differently: Take 2 mg by mouth every 8 (eight) hours as needed for muscle spasms. ) 90 tablet 3  . traMADol (ULTRAM) 50 MG tablet Take 50 mg by mouth every 6 (six) hours.     . ziprasidone (GEODON) 60 MG capsule TAKE 1 CAPSULE IN THE EVENING 30 capsule 5  . loratadine (CLARITIN) 10 MG tablet Take 10 mg by mouth daily as needed for allergies. Prn      . OXYGEN Inhale 2 L into the lungs Nightly.      Results for orders placed or performed during the hospital encounter of 06/19/18 (from the past 48 hour(s))  Glucose, capillary     Status: None   Collection Time: 06/19/18 11:16 AM  Result Value Ref Range   Glucose-Capillary 90 70 - 99 mg/dL   Comment 1 Notify RN    Comment 2 Document in Chart    No results found.  Review of Systems  Constitutional: Negative for chills, fever and weight loss.  HENT: Negative for nosebleeds.   Eyes: Negative for blurred vision.  Respiratory: Negative for shortness of breath.   Cardiovascular: Negative for chest pain, palpitations, orthopnea and PND.       Denies DOE  Gastrointestinal: Positive for  abdominal pain and nausea.  Genitourinary: Negative for dysuria and hematuria.  Musculoskeletal: Positive for back pain.  Skin: Negative for itching and rash.  Neurological: Negative for dizziness, focal weakness, seizures, loss of consciousness and headaches.       Denies TIAs, amaurosis fugax  Endo/Heme/Allergies: Does not bruise/bleed easily.  Psychiatric/Behavioral: The patient is not nervous/anxious.   All other systems reviewed and are negative.   Pulse 65, temperature 98.2 F (36.8 C), temperature source Oral, resp. rate 18, height 5\' 4"  (1.626 m), weight 61.7 kg (136 lb), SpO2 100 %. Physical Exam  Vitals reviewed. Constitutional: She is oriented to person, place, and time. She appears well-developed and well-nourished. No distress.  HENT:  Head: Normocephalic and atraumatic.  Right Ear: External ear normal.  Left Ear: External ear normal.  Eyes: Conjunctivae are normal. No scleral icterus.  Neck: Normal range of motion. Neck supple. No tracheal deviation present. No thyromegaly present.  Cardiovascular: Normal rate and normal heart sounds.  Respiratory: Effort normal and breath sounds normal. No stridor. No respiratory distress. She has no wheezes.  GI: Soft. She exhibits no distension. There is no tenderness. There is no rebound.  Musculoskeletal: She exhibits no edema or tenderness.  Lymphadenopathy:    She has no cervical adenopathy.  Neurological: She is alert and oriented to person, place, and time. She exhibits normal muscle tone.  Skin: Skin is warm and dry. No rash noted. She is not diaphoretic. No erythema. No pallor.  Psychiatric: She has a normal mood and affect. Her behavior is normal. Judgment and thought content normal.     Assessment/Plan  Epigastric abdominal pain History of laparoscopic Roux-en-Y gastric bypass April 2018 GERD Bipolar  She has had significant resolution of her comorbidities and significant weight loss however she has had ongoing  epigastric pain.  We have been unable to elucidate a cause.  At this point I recommended placement of an enteral feeding tube for supplemental nutrition as well as diagnostic laparoscopy to rule out an internal hernia or gastric gastric fistula or adhesive disease.  We have discussed at length the risk and benefits of the procedure including but not limited to bleeding, infection, injury to surrounding structures, failure to ameliorate her pain, blood clot formation, cardiac and pulmonary issues perioperatively, G-tube issue such as infection, dislodgment.  We also discussed the possibility of intolerance to enteral nutrition which is unlikely.  We discussed the typical recovery course.  Leighton Ruff. Redmond Pulling, MD, FACS General, Bariatric, & Minimally Invasive Surgery [facility], [place]   Greer Pickerel, MD 06/19/2018, 1:01 PM

## 2018-06-19 NOTE — Transfer of Care (Signed)
Immediate Anesthesia Transfer of Care Note  Patient: Morgan [name]  Procedure(s) Performed: LAPAROSCOPY DIAGNOSTIC (N/A ) LAPRASCOPIC INSERTION OF GASTROSTOMY TUBE (N/A ) LAPAROSCOPIC REPAIR OF HIATAL HERNIA (N/A )  Patient Location: PACU  Anesthesia Type:General  Level of Consciousness: awake and alert   Airway & Oxygen Therapy: Patient Spontanous Breathing and Patient connected to face mask oxygen  Post-op Assessment: Report given to RN and Post -op Vital signs reviewed and stable  Post vital signs: Reviewed and stable  Last Vitals:  Vitals Value Taken Time  BP 135/75 06/19/2018  4:51 PM  Temp    Pulse 75 06/19/2018  4:53 PM  Resp 12 06/19/2018  4:53 PM  SpO2 100 % 06/19/2018  4:53 PM  Vitals shown include unvalidated device data.  Last Pain:  Vitals:   06/19/18 1106  TempSrc: Oral         Complications: No apparent anesthesia complications

## 2018-06-19 NOTE — Anesthesia Procedure Notes (Signed)
Procedure Name: Intubation Date/Time: 06/19/2018 1:42 PM Performed by: West Pugh, CRNA Pre-anesthesia Checklist: Patient identified, Emergency Drugs available, Suction available, Patient being monitored and Timeout performed Patient Re-evaluated:Patient Re-evaluated prior to induction Oxygen Delivery Method: Circle system utilized Preoxygenation: Pre-oxygenation with 100% oxygen Induction Type: IV induction Ventilation: Mask ventilation without difficulty Laryngoscope Size: Mac and 4 Grade View: Grade II Tube type: Oral Tube size: 7.0 mm Number of attempts: 1 Airway Equipment and Method: Stylet Placement Confirmation: ETT inserted through vocal cords under direct vision,  positive ETCO2,  CO2 detector and breath sounds checked- equal and bilateral Secured at: 20 cm Tube secured with: Tape Dental Injury: Teeth and Oropharynx as per pre-operative assessment

## 2018-06-19 NOTE — Brief Op Note (Addendum)
06/19/2018  4:52 PM  PATIENT:  Morgan [name]  52 y.o. female  PRE-OPERATIVE DIAGNOSIS:  EPIGASTRIC PAIN, HISTORY OF GASTIC BYPASS, MALNUTRITION  POST-OPERATIVE DIAGNOSIS:  EPIGASTRIC PAIN, HISTORY OF GASTIC BYPASS, MALNUTRITION; recurrent hiatal hernia  PROCEDURE:  Procedure(s): LAPAROSCOPY DIAGNOSTIC (N/A) LAPRASCOPIC INSERTION OF GASTROSTOMY TUBE (N/A) LAPAROSCOPIC REPAIR OF Recurrent HIATAL HERNIA (N/A) Upper endoscopy   SURGEON:  Surgeon(s) and Role:    Greer Pickerel, MD - Primary    * Johnathan Hausen, MD - Assisting  PHYSICIAN ASSISTANT:   ASSISTANTS: see above   ANESTHESIA:   general  EBL:  50 mL   BLOOD ADMINISTERED:none  DRAINS: Gastrostomy Tube 18Fr in excluded stomach  Findings: recurrent hiatal hernia (repaired with 3 sutures); candy cane limb about 12 cm long but on EGD the endoscope goes straight into roux limb so did not feel pt's symptoms c/w with candy cane syndrome so did not trim/resect candy cane limb. No internal hernia. Dilated J-J anastomosis but no upstream dilation of the roux limb or BP limb and common channel appeared normal. Placed 18Fr G tube in excluded stomach along greater curvature.   LOCAL MEDICATIONS USED:  MARCAINE    and OTHER exparel  SPECIMEN:  No Specimen  DISPOSITION OF SPECIMEN:  N/A  COUNTS:  YES  TOURNIQUET:  * No tourniquets in log *  DICTATION: .Dragon Dictation [id number]  PLAN OF CARE: Admit for overnight observation  PATIENT DISPOSITION:  PACU - hemodynamically stable.   Delay start of Pharmacological VTE agent (>24hrs) due to surgical blood loss or risk of bleeding: no  Leighton Ruff. Redmond Pulling, MD, FACS General, Bariatric, & Minimally Invasive Surgery [facility], [place]

## 2018-06-20 ENCOUNTER — Encounter (HOSPITAL_COMMUNITY): Payer: Self-pay | Admitting: General Surgery

## 2018-06-20 DIAGNOSIS — F319 Bipolar disorder, unspecified: Secondary | ICD-10-CM | POA: Diagnosis present

## 2018-06-20 DIAGNOSIS — R6889 Other general symptoms and signs: Secondary | ICD-10-CM | POA: Diagnosis present

## 2018-06-20 DIAGNOSIS — E1143 Type 2 diabetes mellitus with diabetic autonomic (poly)neuropathy: Secondary | ICD-10-CM | POA: Diagnosis present

## 2018-06-20 DIAGNOSIS — I10 Essential (primary) hypertension: Secondary | ICD-10-CM | POA: Diagnosis present

## 2018-06-20 DIAGNOSIS — K2270 Barrett's esophagus without dysplasia: Secondary | ICD-10-CM | POA: Diagnosis present

## 2018-06-20 DIAGNOSIS — K449 Diaphragmatic hernia without obstruction or gangrene: Secondary | ICD-10-CM | POA: Diagnosis present

## 2018-06-20 DIAGNOSIS — E441 Mild protein-calorie malnutrition: Secondary | ICD-10-CM | POA: Diagnosis present

## 2018-06-20 DIAGNOSIS — J9610 Chronic respiratory failure, unspecified whether with hypoxia or hypercapnia: Secondary | ICD-10-CM | POA: Diagnosis present

## 2018-06-20 DIAGNOSIS — K5909 Other constipation: Secondary | ICD-10-CM | POA: Diagnosis present

## 2018-06-20 DIAGNOSIS — Z9049 Acquired absence of other specified parts of digestive tract: Secondary | ICD-10-CM | POA: Diagnosis not present

## 2018-06-20 DIAGNOSIS — Z9981 Dependence on supplemental oxygen: Secondary | ICD-10-CM | POA: Diagnosis not present

## 2018-06-20 DIAGNOSIS — R1013 Epigastric pain: Secondary | ICD-10-CM | POA: Diagnosis present

## 2018-06-20 DIAGNOSIS — E785 Hyperlipidemia, unspecified: Secondary | ICD-10-CM | POA: Diagnosis present

## 2018-06-20 DIAGNOSIS — G43709 Chronic migraine without aura, not intractable, without status migrainosus: Secondary | ICD-10-CM | POA: Diagnosis present

## 2018-06-20 DIAGNOSIS — E039 Hypothyroidism, unspecified: Secondary | ICD-10-CM | POA: Diagnosis present

## 2018-06-20 DIAGNOSIS — G4733 Obstructive sleep apnea (adult) (pediatric): Secondary | ICD-10-CM | POA: Diagnosis present

## 2018-06-20 DIAGNOSIS — J309 Allergic rhinitis, unspecified: Secondary | ICD-10-CM | POA: Diagnosis present

## 2018-06-20 DIAGNOSIS — Z9884 Bariatric surgery status: Secondary | ICD-10-CM | POA: Diagnosis not present

## 2018-06-20 DIAGNOSIS — K219 Gastro-esophageal reflux disease without esophagitis: Secondary | ICD-10-CM | POA: Diagnosis present

## 2018-06-20 DIAGNOSIS — M170 Bilateral primary osteoarthritis of knee: Secondary | ICD-10-CM | POA: Diagnosis present

## 2018-06-20 DIAGNOSIS — Z8249 Family history of ischemic heart disease and other diseases of the circulatory system: Secondary | ICD-10-CM | POA: Diagnosis not present

## 2018-06-20 DIAGNOSIS — D649 Anemia, unspecified: Secondary | ICD-10-CM | POA: Diagnosis present

## 2018-06-20 DIAGNOSIS — E559 Vitamin D deficiency, unspecified: Secondary | ICD-10-CM | POA: Diagnosis present

## 2018-06-20 DIAGNOSIS — E538 Deficiency of other specified B group vitamins: Secondary | ICD-10-CM | POA: Diagnosis present

## 2018-06-20 DIAGNOSIS — K3184 Gastroparesis: Secondary | ICD-10-CM | POA: Diagnosis present

## 2018-06-20 LAB — CBC WITH DIFFERENTIAL/PLATELET
Basophils Absolute: 0 10*3/uL (ref 0.0–0.1)
Basophils Relative: 1 %
EOS PCT: 0 %
Eosinophils Absolute: 0 10*3/uL (ref 0.0–0.7)
HCT: 31.4 % — ABNORMAL LOW (ref 36.0–46.0)
HEMOGLOBIN: 10.1 g/dL — AB (ref 12.0–15.0)
LYMPHS ABS: 0.9 10*3/uL (ref 0.7–4.0)
LYMPHS PCT: 18 %
MCH: 30.4 pg (ref 26.0–34.0)
MCHC: 32.2 g/dL (ref 30.0–36.0)
MCV: 94.6 fL (ref 78.0–100.0)
MONOS PCT: 8 %
Monocytes Absolute: 0.4 10*3/uL (ref 0.1–1.0)
Neutro Abs: 3.7 10*3/uL (ref 1.7–7.7)
Neutrophils Relative %: 73 %
Platelets: 210 10*3/uL (ref 150–400)
RBC: 3.32 MIL/uL — AB (ref 3.87–5.11)
RDW: 13.4 % (ref 11.5–15.5)
WBC: 5 10*3/uL (ref 4.0–10.5)

## 2018-06-20 LAB — COMPREHENSIVE METABOLIC PANEL
ALK PHOS: 37 U/L — AB (ref 38–126)
ALT: 35 U/L (ref 0–44)
AST: 43 U/L — ABNORMAL HIGH (ref 15–41)
Albumin: 3.2 g/dL — ABNORMAL LOW (ref 3.5–5.0)
Anion gap: 7 (ref 5–15)
BUN: 16 mg/dL (ref 6–20)
CALCIUM: 8.4 mg/dL — AB (ref 8.9–10.3)
CO2: 33 mmol/L — ABNORMAL HIGH (ref 22–32)
Chloride: 104 mmol/L (ref 98–111)
Creatinine, Ser: 0.7 mg/dL (ref 0.44–1.00)
Glucose, Bld: 110 mg/dL — ABNORMAL HIGH (ref 70–99)
Potassium: 4 mmol/L (ref 3.5–5.1)
Sodium: 144 mmol/L (ref 135–145)
TOTAL PROTEIN: 5.6 g/dL — AB (ref 6.5–8.1)
Total Bilirubin: 0.4 mg/dL (ref 0.3–1.2)

## 2018-06-20 MED ORDER — ENOXAPARIN SODIUM 40 MG/0.4ML ~~LOC~~ SOLN
40.0000 mg | SUBCUTANEOUS | Status: DC
  Administered 2018-06-20 – 2018-06-21 (×2): 40 mg via SUBCUTANEOUS
  Filled 2018-06-20 (×2): qty 0.4

## 2018-06-20 MED ORDER — PREMIER PROTEIN SHAKE
11.0000 [oz_av] | Freq: Two times a day (BID) | ORAL | Status: DC
  Administered 2018-06-20 – 2018-06-21 (×2): 11 [oz_av]

## 2018-06-20 NOTE — Progress Notes (Signed)
Visited with patient.  Provided information on Phase II Bariatric Liquid Diet.  Bedside RN working with patient on flushing G tube. Completed protein shakes and has ordered patient tray.    Case management consulted aware of patient needs.

## 2018-06-20 NOTE — Progress Notes (Signed)
Patient ID: Morgan [name], female   DOB: Mar 22, 1966, 52 y.o.   MRN: [id number]   Progress Note: Metabolic and Bariatric Surgery Service   Chief Complaint/Subjective: Mainly just soreness. No n/v. Walked this am Tolerating water and shake So far no pain like was having before surgery  Objective: Vital signs in last 24 hours: Temp:  [97.6 F (36.4 C)-98.7 F (37.1 C)] 97.8 F (36.6 C) (07/10 0419) Pulse Rate:  [65-81] 68 (07/10 0419) Resp:  [9-18] 16 (07/10 0419) BP: (108-135)/(64-85) 108/69 (07/10 0419) SpO2:  [98 %-100 %] 100 % (07/10 0419) Weight:  [61.7 kg (136 lb)] 61.7 kg (136 lb) (07/09 1141) Last BM Date: 06/19/18(before surgery)  Intake/Output from previous day: 07/09 0701 - 07/10 0700 In: 3706.7 [P.O.:360; I.V.:3100; IV Piggyback:236.7] Out: 2250 [Urine:2200; Blood:50] Intake/Output this shift: No intake/output data recorded.  Lungs: cta, symm  Cardiovascular: reg  Abd: soft, nd, incisions c/d/i; g tube intact  Extremities: no edema, +SCDs  Neuro: nonfocal, alert  Lab Results: CBC  Recent Labs    06/20/18 0422  WBC 5.0  HGB 10.1*  HCT 31.4*  PLT 210   BMET Recent Labs    06/20/18 0422  NA 144  K 4.0  CL 104  CO2 33*  GLUCOSE 110*  BUN 16  CREATININE 0.70  CALCIUM 8.4*   PT/INR No results for input(s): LABPROT, INR in the last 72 hours. ABG No results for input(s): PHART, HCO3 in the last 72 hours.  Invalid input(s): PCO2, PO2  Studies/Results:  Anti-infectives: Anti-infectives (From admission, onward)   Start     Dose/Rate Route Frequency Ordered Stop   06/19/18 1115  ceFAZolin (ANCEF) IVPB 2g/100 mL premix     2 g 200 mL/hr over 30 Minutes Intravenous On call to O.R. 06/19/18 1106 06/19/18 1413      Medications: Scheduled Meds: . acetaminophen (TYLENOL) oral liquid 160 mg/5 mL  650 mg Oral Q6H  . divalproex  1,000 mg Oral QHS  . divalproex  500 mg Oral q morning - 10a  . enoxaparin (LOVENOX) injection  30 mg Subcutaneous  Q12H  . gabapentin  600 mg Oral BID  . levothyroxine  125 mcg Oral QAC breakfast  . olopatadine  1 drop Both Eyes Daily  . protein supplement shake  2 oz Oral Q2H  . sertraline  200 mg Oral Daily  . traMADol  50 mg Oral QID  . ziprasidone  60 mg Oral QHS   Continuous Infusions: . dextrose 5 % and 0.45 % NaCl with KCl 20 mEq/L 125 mL/hr at 06/20/18 0748  . famotidine (PEPCID) IV Stopped (06/19/18 2231)   PRN Meds:.diclofenac sodium, fentaNYL (SUBLIMAZE) injection, loratadine, ondansetron (ZOFRAN) IV, promethazine, simethicone, tiZANidine  Assessment/Plan: Patient Active Problem List   Diagnosis Date Noted  . Epigastric pain 06/19/2018  . Hyperlipidemia associated with type 2 diabetes mellitus ([place]) 07/06/2017  . GERD (gastroesophageal reflux disease) 03/21/2017  . S/P gastric bypass 03/21/2017  . Intractable chronic migraine without aura 06/04/2015  . Pancreatitis 02/27/2014  . Fatty liver disease, nonalcoholic 76/28/3151  . Bipolar disorder ([place]) 02/20/2014  . Metabolic syndrome 76/16/0737  . Vitamin D deficiency   . DM (diabetes mellitus) ([place]) 03/12/2013  . HLD (hyperlipidemia) 03/12/2013  . Preoperative evaluation to rule out surgical contraindication 06/12/2011  . Hypothyroidism   . Constipation, chronic   . Vitamin B 12 deficiency   . ALLERGIC RHINITIS 12/31/2010  . BARRETTS ESOPHAGUS 12/31/2010  . Gastroparesis 12/31/2010  . Bilateral chronic knee pain 12/31/2010  .  Obstructive sleep apnea 09/27/2010  . Migraine 09/27/2010  . Essential hypertension 09/27/2010  . RESPIRATORY FAILURE, CHRONIC 09/27/2010   s/p Procedure(s): LAPAROSCOPY DIAGNOSTIC LAPRASCOPIC INSERTION OF GASTROSTOMY TUBE LAPAROSCOPIC REPAIR OF HIATAL HERNIA 06/19/2018  Doing well So far no pain like was having before surgery!! Tolerating liquids Adv to bari fulls If cont to tolerate liquids, anticipate no TPN/PICC line on discharge Will plan on using G tube as "back-up" route for enteral  nutrition in case has recurrent problems/pain with oral intake prob dc later today Discussed intraop findings with pt Cont chemical vte prophylaxis  Disposition:  LOS: 1 day  The patient will be in the hospital for normal postop protocol  Greer Pickerel, MD [phone] [facility]

## 2018-06-20 NOTE — Progress Notes (Signed)
Instruction via demonstration provided to patient regarding flush and feeding through G Tube. Patient able to return demonstration correctly but needs more practice to be able to manage process alone. She gave herself 1mls of protein shake through her tube. Will continue to monitor her progress and assist when needed.  Donne Hazel, RN

## 2018-06-20 NOTE — Progress Notes (Signed)
Patient had discomfort with fluids from bari full tray.  Orders placed by surgeon for protein shake through G tube between meals twice per day.  Discussed with Case Management HHRN after discharge for patient  needs related to Gtube. Patient and bedside RN aware of the above.

## 2018-06-20 NOTE — Op Note (Signed)
NAME: Morgan [name], Morgan [name] MEDICAL RECORD LZ:[id number] ACCOUNT [id number] DATE OF BIRTH:July 07, 1966 FACILITY: [facility] LOCATION: [id number] PHYSICIAN:Fallen Crisostomo Ronnie Derby, MD  OPERATIVE REPORT  DATE OF PROCEDURE:  06/19/2018  PREOPERATIVE DIAGNOSES: 1.  Epigastric pain. 2.  History of laparoscopic Roux-en-Y gastric bypass 03/2017. 3.  Malnutrition.  POSTOPERATIVE DIAGNOSES: 1.  Epigastric pain. 2.  History of laparoscopic Roux-en-Y gastric bypass 03/2017. 3.  Malnutrition. 4.  Recurrent hiatal hernia.  PROCEDURES: 1.  Diagnostic laparoscopy. 2.  Laparoscopic repair of recurrent hiatal hernia. 3.  Laparoscopic placement of an 18-French gastrotomy tube in excluded stomach. 4.  Upper endoscopy.    SURGEON:  Leighton Ruff. Redmond Pulling, MD  ASSISTANT SURGEON:  Johnathan Hausen, MD  ANESTHESIA:  General.  ESTIMATED BLOOD LOSS:  18.  DRAINS:  18-French gastrotomy tube in excluded stomach.  Local Marcaine mixed with Exparel as a TAP block in bilateral lateral abdominal walls.  FINDINGS:  The patient had evidence of recurrent hiatal hernia, which was primarily repaired with 3 interrupted sutures.  Her candy cane limb was approximately 12 cm long, but on upper endoscopy, the endoscope goes straight into the Roux limb, so I did  not feel the patient's symptoms were consistent with candy cane syndrome, so did not resect the candy cane.  No internal hernia.  She had a dilated jejunojejunostomy; however, there was no upstream dilatation of the Roux limb or BP limb and the common  channel appeared normal.  INDICATIONS FOR PROCEDURE:  The patient is a pleasant 52 year old female who underwent laparoscopic Roux-en-Y gastric bypass in 03/2017.  Her comorbidities at that time included obstructive sleep apnea, hypertension, diabetes mellitus, hyperlipidemia,  fatty liver disease, metabolic syndrome.  She underwent a routine laparoscopic antecolic, antegastric Roux-en-Y gastric bypass with repair of a hiatal hernia.  The  hiatal hernia was repaired with 3 interrupted sutures.  She had a fair amount of  perigastric fat tissue at that time.  She did quite well initially after surgery.  Her BMI preoperatively was 44.  She developed some epigastric pain, mainly postprandial, in the fall and ended up getting an upper endoscopy which showed a marginal ulcer.   She was started on PPI therapy and Carafate and extraction of 1-2 of the exposed staples at the gastrojejunal anastomosis by her gastroenterologist.  Followup endoscopy showed resolution of her ulcer and her pain resolved; however, a few months later,  she had recurrent epigastric pain, mainly postprandial, initially with solids.  We switched her back to mainly a liquid diet and continued a PPI and Carafate.  She had a CT scan at outside facility, which was unremarkable.  She ended up having an upper  GI as well, which demonstrated normal postoperative findings.  No evidence of hiatal hernia or GERD.  Her oral intake worsened.  She then started having trouble with liquids as well, mainly epigastric pain.  I talked with her gastroenterologist and we  tried her on medications for irritable bowel, but there was no relief.  She underwent a repeat upper endoscopy, which demonstrated no evidence of Barrett's esophagus or recurrent ulcer.  At this point, a PICC line was placed and TPN was started due to  her protein calorie malnutrition.  I also recommended diagnostic laparoscopy since we could not figure out a source of her ongoing epigastric pain.  I did not believe there is a psychological component to her problem.  We discussed at length the risks  and benefits of the procedure including but not limited to bleeding, infection, injury to surrounding  structures, leak, potential need for bowel resection, failure to ameliorate her pain, placement of a feeding tube in excluded stomach with associated  G-tube issues such as leaking or dislodgement, perioperative cardiac and pulmonary  events as well as a blood clot formation.  DESCRIPTION OF PROCEDURE:  She was given oral Tylenol and gabapentin preoperatively.  She was also given 5000 units of subcutaneous heparin.  She was taken to OR [facility] at [facility] and placed supine on the operating table.  Sequential compression  devices were placed.  General endotracheal anesthesia was established.  Her left arm was tucked with the appropriate padding.  She had lost a fair amount of weight and her comorbidities had resolved.  I decided to gain access to her abdomen using a  Hasson technique.  This was done after surgical timeout was performed and IV antibiotic was administered.  A small supraumbilical incision was made.  The fascia was grasped and lifted anteriorly.  Next, the fascia was incised and a pursestring suture was  placed around the fascial edges with a 0 Vicryl followed by placement of a 12 mm Hasson trocar into the abdominal cavity.  A pneumoperitoneum was smoothly established up to a patient pressure of 15 mmHg.  The laparoscope was advanced and the abdominal  cavity was surveyed.  There was no evidence of injury to surrounding structures.  Her fatty liver/hepatic steatosis had resolved.  There was gross evidence of a recurrent diaphragmatic defect or hiatal hernia.  There also appeared to be a dilated jejunojejunostomy.  I ended up placing two 5 mm trocars in the right abdominal area and  we also placed two 5 mm trocars in the left side of the abdomen, all under direct visualization.  A TAP block was placed on both sides of the abdominal wall using Exparel, Marcaine mixture using laparoscopic assistance.  I first inspected the hiatus.   There was evidence of a gap between the left and right crura of the diaphragm.  It appeared that all of the pouch was in the abdomen per se,  but there definitely was a recurrent hiatal hernia.  I identified the jejunojejunostomy and the candy cane or  blind end of the Roux limb.  The blind  end of the Roux limb or the candy cane limb was approximately 12 cm in length.  There was no evidence of a gastrogastric fistula.  The gastric pouch and Roux limb were completely separated from the excluded stomach.   I ran the Roux limb all the way down to the jejunojejunostomy.  The Roux limb was not dilated.  The jejunojejunostomy was sort of pan dilated for lack of better description.  There was no evidence of an internal hernia in this location or at Petersen's  space.  Identified the biliopancreatic limb at the jejunojejunostomy and ran it back proximally all the way back to the ligament of Treitz.  The biliary pancreatic limb was not dilated either.  I went back to the jejunojejunostomy and ran the bowel  distally starting there, all the way down the common channel to the terminal ileum.  There is no evidence of a distal small bowel stricture.  The small bowel was essentially normal in caliber.  There was no sign of abnormal intraabdominal adhesive bands.   The sigmoid colon appeared normal as well.  It did appear to have some stool in it.  At this point, I decided that the jejunojejunostomy, even though it was pan dilated, did not represent the source  of her pain.  There was no proximal dilatation of  either the biliopancreatic limb or Roux limb or evidence suggesting a stricture.  None of her preoperative imaging also showed no evidence of an obstruction at the jejunojejunostomy or stricture.  Therefore, I decided that I would repair her recurrent  hiatal hernia.  The patient had been placed in reverse Trendelenburg.  I identified the right crus of the diaphragm.  I incised the peritoneum overlying this with a Harmonic scalpel.  I came across 2 of the titanium tie knots from the original surgery.   I did have the CRNA pass a calibration tube through the oropharynx, down the esophagus into the pouch just to outline the esophagus and pouch while we were dissecting around the left and right crura of the  diaphragm.  There was evidence of a hernia sac.   This was pulled down with both the Harmonic scalpel as well as with blunt dissection.  We ended up passing a Penrose drain around the pouch in order to lift the pouch and distal esophagus to help further expose the crural defect.  Identified the left  crus of the diaphragm and took down some of the scar tissue around this area.  At this point, prior to closing the diaphragmatic defect, Dr. Hassell Done scrubbed out and performed an upper endoscopy.  There was no evidence of severe esophageal mucosa changes.   There was no evidence of an irregular Z line.  I was looking at him laparoscopically and could see the tip of the endoscope in the gastric pouch.  The pouch appeared normal.  There was no evidence of gastritis.  There was no evidence of a margin ulcer.   The anastomosis was widely patent and I was able to confirm that there was at least 3 cm of esophagus within the abdominal cavity at this point.  At this point, the endoscope was removed and he scrubbed back in.  I then reapproximated the left and  right crura of the diaphragm with 3 interrupted 0 Ethibond sutures, each secured with a titanium tie knot.  The Penrose drain was removed.  A calibration tube was able to be passed down the esophagus into the pouch through the diaphragm without  significant resistance.  When he did do the endoscopy, the endoscope went straight into the Roux limb.  It did not preferentially go into the candy cane side of the Roux limb.  Therefore, I did not think resecting the candy cane would be of much clinical  benefit since her upper GI did not show filling of the candy cane and the endoscope went straight down the Roux limb and not into the candy cane.  Because it was unclear whether or not the recurrent hiatal hernia was the source of her abdominal pain, I  did proceed with placement of a gastrostomy tube in the excluded stomach.  We identified a portion in the excluded stomach  along the greater curvature, sort of in the proximal antral area and then we went ahead and percutaneously placed a G-tube.  A  needle was introduced in the left upper quadrant and placed through the abdominal wall into the stomach.  We then placed a guidewire through the needle into the stomach.  The needle was then removed.  We then enlarged the skin incision around the wire.   We then passed an introducer with the sheath through the abdominal wall and into the stomach over the guidewire.  We then placed an 18-French gastrostomy  tube through the sheath and advanced it into the stomach and then peeled away the sheath.  We then  inflated the balloon on the G-tube with 7 mL of water.  I then placed 2 stay sutures with 2-0 silk suture with a laparoscopic needle driver one on each side, taking care not to injure the balloon of the feeding tube.  It was then secured to the abdominal  wall using a PMI suture passer as a transfascial stay suture x2.  I infiltrated some Exparel around this location.  Before repairing the hiatal hernia, I did upsize one of the 5 mm trocars in the right mid abdomen to a 12 mm trocar.  I closed the 12 mm  fascial defect with an interrupted 0 Vicryl using a PMI suture passer and then we removed the Hasson trocar and closed the pursestring suture.  I did place an additional interrupted 0 Vicryl using a PMI suture passer at the umbilical fascia.   Pneumoperitoneum was released.  Remaining trocars were removed.  We placed #2 nylon sutures through the G-tube flange to secure it to the skin.  The G-tube had been secured at approximately 2 cm.  Dermabond was placed on the skin incisions.  The patient  was extubated and taken to the recovery room in stable condition.  All needle, instrument and sponge counts were correct x2.  There were no immediate complications.  The patient tolerated the procedure well.  GN/NUANCE  D:06/20/2018 T:06/20/2018 JOB:[id number]

## 2018-06-20 NOTE — Plan of Care (Signed)
Nutrition Note  RD consulted for diet education regarding bariatric surgery. Pt reports having gastric bypass in April of 2018. Pt with history of home TPN. RD from [facility] reached out to this RD regarding patient prior to admission.  Per surgery, plan is to not resume TPN at home. Pt is s/p G-tube placement, if needed.  No plans to start TF at this time as pt is tolerating clears. Pt about to order bariatric full liquid tray. Tolerating protein shakes.   Will contact RDs at [facility] regarding discharge plans.  Clayton Bibles, MS, RD, [place] Dietitian Pager: [phone] After Hours Pager: [phone]

## 2018-06-20 NOTE — Anesthesia Postprocedure Evaluation (Signed)
Anesthesia Post Note  Patient: Morgan [name]  Procedure(s) Performed: LAPAROSCOPY DIAGNOSTIC (N/A ) LAPRASCOPIC INSERTION OF GASTROSTOMY TUBE (N/A ) LAPAROSCOPIC REPAIR OF HIATAL HERNIA (N/A )     Patient location during evaluation: PACU Anesthesia Type: General Level of consciousness: awake and alert Pain management: pain level controlled Vital Signs Assessment: post-procedure vital signs reviewed and stable Respiratory status: spontaneous breathing, nonlabored ventilation, respiratory function stable and patient connected to nasal cannula oxygen Cardiovascular status: blood pressure returned to baseline and stable Postop Assessment: no apparent nausea or vomiting Anesthetic complications: no    Last Vitals:  Vitals:   06/20/18 0419 06/20/18 1003  BP: 108/69 113/75  Pulse: 68 67  Resp: 16 18  Temp: 36.6 C 37 C  SpO2: 100% 100%    Last Pain:  Vitals:   06/20/18 1055  TempSrc:   PainSc: 0-No pain                 Montez Hageman

## 2018-06-21 ENCOUNTER — Encounter (HOSPITAL_COMMUNITY): Payer: Self-pay | Admitting: General Surgery

## 2018-06-21 NOTE — Progress Notes (Signed)
Patient successfully demonstrated g-tube feed and flush.

## 2018-06-21 NOTE — Discharge Summary (Signed)
Physician Discharge Summary  Morgan [name] GBT:[id number] DOB: 1966/03/19 DOA: 06/19/2018  PCP: Janora Norlander, DO  Admit date: 06/19/2018 Discharge date: 06/21/2018  Recommendations for Outpatient Follow-up:    Follow-up Information    Greer Pickerel, MD Follow up.   Specialty:  General Surgery Why:  keep august appointment as scheduled. can cancel July appt Contact information: [address] [place] [address] [phone]        [facility] [place] Follow up in 1 week(s).   Specialty:  [place] Why:  nurse to assist with PEG tube Contact information: [address] [place] [address] [phone]          Discharge Diagnoses:  1. Recurrent hiatal hernia 2. Epigastric pain 3. Mild protein calorie malnutrition 4. 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 Bipolar disease 6. Chronic anemia  Surgical Procedure: Diagnostic laparoscopy, laparoscopic repair of recurrent hiatal hernia, laparoscopic placement of 18 French gastrostomy tube in excluded stomach  Discharge Condition: Good Disposition: Home  Diet recommendation: Bariatric full liquid diet as well as supplemental enteral nutrition through G-tube consisting of bolus protein shakes  Filed Weights   06/19/18 1141  Weight: 61.7 kg (136 lb)    History of present illness: 52 year old Caucasian female status post laparoscopic Roux-en-Y gastric bypass April 2018 has had ongoing epigastric pain for the past several months.  She had epigastric postprandial pain back in November 2018 and was diagnosed with a marginal ulcer on endoscopy by her gastroenterologist.  She underwent medical management as well as removal of some staples.  Repeat upper endoscopy in early winter 2019 showed resolution of the ulcer and her pain resolved.  However a few months ago she started having recurrent epigastric pain mainly postprandial within 10 to 15 minutes of eating.  CT scan  at outside hospital was unremarkable.  She underwent an upper GI which was also unremarkable.  She started having pain in her upper abdomen even without oral intake.  Her nutrition started to suffer so she underwent placement of a PICC line and started on TPN.  She got down to a weight of 121 pounds.  She had a repeat upper endoscopy which was unremarkable per her gastroenterologist.  I repeated her CT scan of her abdomen and there is no evidence of abnormality.  She still has ongoing epigastric pain even without oral intake.  At this point I recommended diagnostic laparoscopy with placement of a gastrostomy tube in her excluded stomach.  Since starting the TPN she has gained approximately 15 pounds.  She denies any fevers or chills.  She denies any melena or hematochezia.  She denies any hematemesis.  She denies any NSAID use     Hospital Course:  She was taken to the operating room on July 9.  We found a recurrent hiatal hernia.  There was no evidence of a gastric gastric fistula.  There is no evidence of an internal hernia.  Her jejunojejunostomy anastomosis was dilated however the upstream rule limb as well as biliary pancreatic limb were normal.  There is no evidence of a stricture at her jejunojejunostomy.  None of her preoperative imaging demonstrated obstruction at this location so therefore I did not resect and redo her jejunojejunostomy..  I repaired her recurrent hiatal hernia and placed a 84 French gastrostomy tube in her excluded stomach.  She was maintained on chemical DVT prophylaxis perioperatively.  She was started on an oral diet the evening of surgery.  On postoperative day 1 she stated that the pain she was having preoperatively was much improved.  She did have abdominal wall soreness.  However later on postoperative day 1 she experienced pain with oral intake similar to what she was having preoperatively.  Therefore we instructed the nursing staff to start teaching the patient on how to  use her G-tube.  She was instructed on how to flush the G-tube as well as administering bolus G-tube feeds consisting of protein shakes.  The first protein shake through her G-tube went well.  However the second 1 caused discomfort and cramping.  It was felt that it was bolused to rapidly.  On postoperative day 2 she was doing well.  Her vital signs were stable.  Her pain was well controlled.  She was taking in nutrition by mouth as well as via G-tube.  She still had some ongoing mild abdominal discomfort.  However I felt her nutritional intake was stable for discharge.  We discussed using slow gravity bolus tube feeds as needed for supplementation.  I also instructed her to stay on a full liquid diet for the next week before resuming or trying solid foods.  We removed her PICC line prior to discharge since she will not be going home on TPN  BP 105/67 (BP Location: Left Arm)   Pulse (!) 55   Temp 97.8 F (36.6 C) (Oral)   Resp 16   Ht 5\' 4"  (1.626 m)   Wt 61.7 kg (136 lb)   SpO2 100%   BMI 23.34 kg/m   Gen: alert, NAD, non-toxic appearing Pupils: equal, no scleral icterus Pulm: Lungs clear to auscultation, symmetric chest rise CV: regular rate and rhythm Abd: soft, mild approp tender, nondistended.  No cellulitis. No incisional hernia Ext: no edema, no calf tenderness Skin: no rash, no jaundice    Discharge Instructions  Discharge Instructions    Ambulate hourly while awake   Complete by:  As directed    Call MD for:  difficulty breathing, headache or visual disturbances   Complete by:  As directed    Call MD for:  persistant dizziness or light-headedness   Complete by:  As directed    Call MD for:  persistant nausea and vomiting   Complete by:  As directed    Call MD for:  redness, tenderness, or signs of infection (pain, swelling, redness, odor or green/yellow discharge around incision site)   Complete by:  As directed    Call MD for:  severe uncontrolled pain   Complete by:   As directed    Call MD for:  temperature >101 F   Complete by:  As directed    Diet bariatric full liquid   Complete by:  As directed    Discharge instructions   Complete by:  As directed    See bariatric discharge instructions   Incentive spirometry   Complete by:  As directed    Perform hourly while awake     Allergies as of 06/21/2018      Reactions   Ketoconazole    REACTION: hives, swelling   Pravachol [pravastatin Sodium] Shortness Of Breath, Swelling, Anaphylaxis   Throat swelling   Tape Dermatitis, Other (See Comments)   Codeine    REACTION: increased BP   Statins    REACTION: leg crapms      Medication List    TAKE these medications   diclofenac sodium 1 % Gel Commonly known as:  VOLTAREN Apply 2 g topically 2 (  two) times daily as needed (JOINT PAIN). Use as directed for knee pain   divalproex 500 MG DR tablet Commonly known as:  DEPAKOTE TAKE 1 TABLET EVERY MORNING AND 2 TABLETS AT BEDTIME   Erenumab-aooe 70 MG/ML Soaj Commonly known as:  AIMOVIG 140 DOSE Inject 140 mg into the skin every 30 (thirty) days.   ezetimibe 10 MG tablet Commonly known as:  ZETIA TAKE 1 TABLET ONCE DAILY   fluticasone 50 MCG/ACT nasal spray Commonly known as:  FLONASE SPRAY 1 SPRAY IN EACH NOSTRIL TWICE DAILY. SHAKE GENTLY BEFORE EACH [place] E. What changed:  See the new instructions.   gabapentin 300 MG capsule Commonly known as:  NEURONTIN Take 2 capsules (600 mg total) 2 (two) times daily by mouth.   HEALTHY COLON PO Take 1 capsule by mouth daily.   levothyroxine 125 MCG tablet Commonly known as:  SYNTHROID, LEVOTHROID Take 125 mcg by mouth daily before breakfast.   loratadine 10 MG tablet Commonly known as:  CLARITIN Take 10 mg by mouth daily as needed for allergies. Prn   multivitamin capsule Take 1 capsule by mouth daily.   nystatin powder Commonly known as:  NYAMYC APPLY TOPICALLY TWICE A DAY AS NEEDED   ONE TOUCH ULTRA TEST test strip Generic drug:   glucose blood CHECK BLOOD SUGAR TWICE A DAY AS DIRECTED   OXYGEN Inhale 2 L into the lungs Nightly.   pantoprazole 40 MG tablet Commonly known as:  PROTONIX Take 1 tablet by mouth daily.   PAZEO 0.7 % Soln Generic drug:  Olopatadine HCl Place 1 drop into both eyes every morning.   polyethylene glycol packet Commonly known as:  MIRALAX / GLYCOLAX Take 17 g by mouth daily as needed for mild constipation.   RESTASIS MULTIDOSE 0.05 % ophthalmic emulsion Generic drug:  cycloSPORINE Place 1 drop into both eyes 2 (two) times daily.   sertraline 100 MG tablet Commonly known as:  ZOLOFT TAKE (2) TABLETS DAILY What changed:    how much to take  how to take this  when to take this  additional instructions   tiZANidine 2 MG tablet Commonly known as:  ZANAFLEX Take 1 tablet (2 mg total) by mouth 3 (three) times daily. What changed:    when to take this  reasons to take this   traMADol 50 MG tablet Commonly known as:  ULTRAM Take 50 mg by mouth every 6 (six) hours.   ziprasidone 60 MG capsule Commonly known as:  [place] 1 CAPSULE IN THE EVENING      Follow-up Information    Greer Pickerel, MD Follow up.   Specialty:  General Surgery Why:  keep august appointment as scheduled. can cancel July appt Contact information: [address] [place] [address] [phone]        [facility], [place] Follow up in 1 week(s).   Specialty:  [place] Why:  nurse to assist with PEG tube Contact information: [address] [place] [address] [phone]            The results of significant diagnostics from this hospitalization (including imaging, microbiology, ancillary and laboratory) are listed below for reference.    Significant Diagnostic Studies: Ct Abdomen Pelvis W Contrast  Addendum Date: 06/12/2018   ADDENDUM REPORT: 06/12/2018 11:32 ADDENDUM: Further review of the films with additional history of prior  abnormal CT with air within the excluded gastric segment was performed. There is air and fluid within the excluded  gastric segment as well as within the proximal duodenum. This air likely entered the excluded gastric segment in a retrograde fashion. No findings to suggest fistulization between the residual gastric lumen and excluded gastric lumen is seen. Electronically Signed   By: Inez Catalina M.D.   On: 06/12/2018 11:32   Result Date: 06/12/2018 CLINICAL DATA:  Postprandial epigastric pain EXAM: CT ABDOMEN AND PELVIS WITH CONTRAST TECHNIQUE: Multidetector CT imaging of the abdomen and pelvis was performed using the standard protocol following bolus administration of intravenous contrast. CONTRAST:  164mL OMNIPAQUE IOHEXOL 300 MG/ML  SOLN COMPARISON:  None. FINDINGS: Lower chest: No acute abnormality. Hepatobiliary: No focal liver abnormality is seen. Status post cholecystectomy. No biliary dilatation. Pancreas: Unremarkable. No pancreatic ductal dilatation or surrounding inflammatory changes. Spleen: Normal in size without focal abnormality. Adrenals/Urinary Tract: Adrenal glands are within normal limits. A small 1 cm left renal cyst is noted. No renal calculi or obstructive changes are noted. Bladder is decompressed. Stomach/Bowel: Scattered fecal material is noted throughout the colon without obstructive change. Postsurgical changes in the stomach are noted consistent with the given clinical history the appendix is not visualized consistent with prior surgical history. No obstructive or inflammatory changes are seen. Vascular/Lymphatic: Aortic atherosclerosis. No enlarged abdominal or pelvic lymph nodes. Reproductive: Uterus and bilateral adnexa are unremarkable. Other: No abdominal wall hernia or abnormality. No abdominopelvic ascites. Mild subcutaneous edema is noted diffusely consistent with anasarca. Musculoskeletal: Degenerative changes of lumbar spine are seen. IMPRESSION: Mild changes of anasarca. No  other acute abnormality noted. Chronic changes as described above. Electronically Signed: By: Inez Catalina M.D. On: 06/12/2018 09:59   Ir Picc Placement Right >5 Yrs Inc Img Guide  Result Date: 06/01/2018 CLINICAL DATA:  Needs parenteral feeding support post bariatric surgery EXAM: PICC PLACEMENT WITH ULTRASOUND AND FLUOROSCOPY FLUOROSCOPY TIME:  0.1 minute; 7 uGym2 DAP TECHNIQUE: After written informed consent was obtained, patient was placed in the supine position on angiographic table. Patency of the right basilic vein was confirmed with ultrasound with image documentation. An appropriate skin site was determined. Skin site was marked. Region was prepped using maximum barrier technique including cap and mask, sterile gown, sterile gloves, large sterile sheet, and Chlorhexidine as cutaneous antisepsis. The region was infiltrated locally with 1% lidocaine. Under real-time ultrasound guidance, the right basilic vein was accessed with a 21 gauge micropuncture needle; the needle tip within the vein was confirmed with ultrasound image documentation. Needle exchanged over a 018 guidewire for a peel-away sheath, through which a 5-French single-lumen power injector PICC trimmed to 39cm was advanced, positioned with its tip near the cavoatrial junction. Spot chest radiograph confirms appropriate catheter position. Catheter was flushed per protocol and secured externally. The patient tolerated procedure well. COMPLICATIONS: COMPLICATIONS none IMPRESSION: 1. Technically successful five [place] single lumen power injectable PICC placement Electronically Signed   By: Lucrezia Europe M.D.   On: 06/01/2018 09:30    Microbiology: No results found for this or any previous visit (from the past 240 hour(s)).   Labs: Basic Metabolic Panel: Recent Labs  Lab 06/15/18 0846 06/20/18 0422  NA 143 144  K 4.5 4.0  CL 103 104  CO2 33* 33*  GLUCOSE 82 110*  BUN 26* 16  CREATININE 0.53 0.70  CALCIUM 8.5* 8.4*   Liver  Function Tests: Recent Labs  Lab 06/20/18 0422  AST 43*  ALT 35  ALKPHOS 37*  BILITOT 0.4  PROT 5.6*  ALBUMIN 3.2*   No results for input(s): LIPASE, AMYLASE in  the last 168 hours. No results for input(s): AMMONIA in the last 168 hours. CBC: Recent Labs  Lab 06/20/18 0422  WBC 5.0  NEUTROABS 3.7  HGB 10.1*  HCT 31.4*  MCV 94.6  PLT 210   Cardiac Enzymes: No results for input(s): CKTOTAL, CKMB, CKMBINDEX, TROPONINI in the last 168 hours. BNP: BNP (last 3 results) No results for input(s): BNP in the last 8760 hours.  ProBNP (last 3 results) No results for input(s): PROBNP in the last 8760 hours.  CBG: Recent Labs  Lab 06/19/18 1116 06/19/18 1655  GLUCAP 90 158*    Active Problems:   Epigastric pain   Time coordinating discharge: 15 min  Signed:  Gayland Curry, MD [facility], [place] [phone] 06/21/2018, 11:13 AM

## 2018-06-21 NOTE — Progress Notes (Signed)
Spoke with patient at bedside. She has no preference for [facility] agency, needs nursing assistance for PEG. Contacted [facility] for referral, they accepted. No further d/c needs. [phone]

## 2018-06-21 NOTE — Progress Notes (Signed)
Discharge instructions discussed with patient and family, verbalized agreement and understanding 

## 2018-06-21 NOTE — Progress Notes (Signed)
Dr on call was paged twice about patient's BP, no new orders were given. RN rechecked patient's BP it was 98/62. Will continue to monitor patient.

## 2018-06-21 NOTE — Progress Notes (Signed)
Patient reported discomfort with trays and more discomfort with G tube shake/flush.  We discussed technique for introducing fluids/flush into tube.

## 2018-06-21 NOTE — Progress Notes (Signed)
Patient's BP was 88/56 and her HR was 59, Dr. Marcello Moores was paged. Will wait on new orders and continue to monitor patient.

## 2018-06-21 NOTE — Discharge Instructions (Signed)
GASTRIC BYPASS/SLEEVE  Home Care Instructions   These instructions are to help you care for yourself when you go home.  Call: If you have any problems.  Call [phone] and ask for the surgeon on call  If you need immediate help, come to the ER at [facility].    Signs and symptoms to report:  Severe vomiting or nausea o If you cannot keep down clear liquids for longer than 1 day, call your surgeon   Abdominal pain that does not get better after taking your pain medication  Fever over 101 F with chills  Heart beating over 100 beats a minute  Shortness of breath at rest  Chest pain   Redness, swelling, drainage, or foul odor at incision (surgical) sites   If your incisions open or pull apart  Swelling or pain in calf (lower leg)  Diarrhea (Loose bowel movements that happen often), frequent watery, uncontrolled bowel movements  Constipation, (no bowel movements for 3 days) if this happens: Pick one o Milk of Magnesia, 2 tablespoons by mouth, 3 times a day for 2 days if needed o Stop taking Milk of Magnesia once you have a bowel movement o Call your doctor if constipation continues Or o Miralax  (instead of Milk of Magnesia) following the label instructions o Stop taking Miralax once you have a bowel movement o Call your doctor if constipation continues  Anything you think is not normal   Normal side effects after surgery:  Unable to sleep at night or unable to focus  Irritability or moody  Being tearful (crying) or depressed These are common complaints, possibly related to your anesthesia medications that put you to sleep, stress of surgery, and change in lifestyle.  This usually goes away a few weeks after surgery.  If these feelings continue, call your primary care doctor.   Wound Care: You may have surgical glue, steri-strips, or staples over your incisions after surgery  Surgical glue:  Looks like a clear film over your incisions and will wear off a  little at a time  Steri-strips: Strips of tape over your incisions. You may notice a yellowish color on the skin under the steri-strips. This is used to make the   steri-strips stick better. Do not pull the steri-strips off - let them fall off  Staples: Staples may be removed before you leave the hospital o If you go home with staples, call [facility], [phone]) [phone] at for an appointment with your surgeons nurse to have staples removed 10 days after surgery.  Showering: You may shower two (2) days after your surgery unless your surgeon tells you differently o Wash gently around incisions with warm soapy water, rinse well, and gently pat dry  o No tub baths until staples are removed, steri-strips fall off or glue is gone.    Medications:  Medications should be liquid or crushed if larger than the size of a dime  Extended release pills (medication that release a little bit at a time through the day) should NOT be crushed or cut. (examples include XL, ER, DR, SR)  Depending on the size and number of medications you take, you may need to space (take a few throughout the day)/change the time you take your medications so that you do not over-fill your pouch (smaller stomach)  Make sure you follow-up with your primary care doctor to make medication changes needed during rapid weight loss and life-style changes  If you have diabetes, follow  up with the doctor that orders your diabetes medication(s) within one week after surgery and check your blood sugar regularly.  Do not drive while taking prescription pain medication   It is ok to take Tylenol by the bottle instructions with your pain medicine or instead of your pain medicine as needed.  DO NOT TAKE NSAIDS (EXAMPLES OF NSAIDS:  IBUPROFREN/ NAPROXEN)  Diet:                    First 2 Weeks  You will see the dietician t about two (2) weeks after your surgery. The dietician will increase the types of foods you can eat if you are  handling liquids well:  If you have severe vomiting or nausea and cannot keep down clear liquids lasting longer than 1 day, call your surgeon @ [phone]) Protein Shake  Drink at least 2 ounces of shake 5-6 times per day  Each serving of protein shakes (usually 8 - 12 ounces) should have: o 15 grams of protein  o And no more than 5 grams of carbohydrate   Goal for protein each day: o Men = 80 grams per day o Women = 60 grams per day  Protein powder may be added to fluids such as non-fat milk or Lactaid milk or unsweetened Soy/Almond milk (limit to 35 grams added protein powder per serving)  Hydration  Slowly increase the amount of water and other clear liquids as tolerated (See Acceptable Fluids)  Slowly increase the amount of protein shake as tolerated    Sip fluids slowly and throughout the day.  Do not use straws.  May use sugar substitutes in small amounts (no more than 6 - 8 packets per day; i.e. Splenda)  Fluid Goal  The first goal is to drink at least 8 ounces of protein shake/drink per day (or as directed by the nutritionist); some examples of protein shakes are [employer], [employer], EAS Edge HP, and Unjury. See handout from pre-op Bariatric Education Class: o Slowly increase the amount of protein shake you drink as tolerated o You may find it easier to slowly sip shakes throughout the day o It is important to get your proteins in first  Your fluid goal is to drink 64 - 100 ounces of fluid daily o It may take a few weeks to build up to this  32 oz (or more) should be clear liquids  And   32 oz (or more) should be full liquids (see below for examples)  Liquids should not contain sugar, caffeine, or carbonation  Clear Liquids:  Water or Sugar-free flavored water (i.e. Fruit H2O, Propel)  Decaffeinated coffee or tea (sugar-free)  Crystal Lite, Wylers Lite, Minute Maid Lite  Sugar-free Jell-O  Bouillon or broth  Sugar-free Popsicle:   *Less  than 20 calories each; Limit 1 per day  Full Liquids: Protein Shakes/Drinks + 2 choices per day of other full liquids  Full liquids must be: o No More Than 15 grams of Carbs per serving  o No More Than 3 grams of Fat per serving  Strained low-fat cream soup (except Cream of Potato or Tomato)  Non-Fat milk  Fat-free Lactaid Milk  Unsweetened Soy Or Unsweetened Almond Milk  Low Sugar yogurt (Dannon Lite & Fit, Greek yogurt; Oikos Triple Zero; Chobani Simply 100; Yoplait 100 calorie [place] - No Fruit on the Bottom)    Vitamins and Minerals  Start 1 day after surgery unless otherwise directed by your surgeon  2 Chewable  Bariatric Specific Multivitamin / Multimineral Supplement with iron (Example: Bariatric Advantage Multi EA)  Chewable Calcium with Vitamin D-3 (Example: 3 Chewable Calcium Plus 600 with Vitamin D-3) o Take 500 mg three (3) times a day for a total of 1500 mg each day o Do not take all 3 doses of calcium at one time as it may cause constipation, and you can only absorb 500 mg  at a time  o Do not mix multivitamins containing iron with calcium supplements; take 2 hours apart  Menstruating women and those with a history of anemia (a blood disease that causes weakness) may need extra iron o Talk with your doctor to see if you need more iron  Do not stop taking or change any vitamins or minerals until you talk to your dietitian or surgeon  Your Dietitian and/or surgeon must approve all vitamin and mineral supplements   Activity and Exercise: Limit your physical activity as instructed by your doctor.  It is important to continue walking at home.  During this time, use these guidelines:  Do not lift anything greater than ten (10) pounds for at least two (2) weeks  Do not go back to work or drive until Engineer, production says you can  You may have sex when you feel comfortable  o It is VERY important for female patients to use a reliable birth control method; fertility often  increases after surgery  o All hormonal birth control will be ineffective for 30 days after surgery due to medications given during surgery a barrier method must be used. o Do not get pregnant for at least 18 months  Start exercising as soon as your doctor tells you that you can o Make sure your doctor approves any physical activity  Start with a simple walking program  Walk 5-15 minutes each day, 7 days per week.   Slowly increase until you are walking 30-45 minutes per day Consider joining our [place] program. [phone] or email [email or web address]   Special Instructions Things to remember:  Use your CPAP when sleeping if this applies to you   [facility] has two free Bariatric Surgery Support Groups that meet monthly o The 3rd Thursday of each month, 6 pm, [facility] [employer]  o The 2nd Friday of each month, 11:45 am in the private dining room in the basement of [place]  It is very important to keep all follow up appointments with your surgeon, dietitian, primary care physician, and behavioral health practitioner  Routine follow up schedule with your surgeon include appointments at 2-3 weeks, 6-8 weeks, 6 months, and 1 year at a minimum.  Your surgeon may request to see you more often.   o After the first year, please follow up with your bariatric surgeon and dietitian at least once a year in order to maintain best weight loss results [facility]: [place]: [phone] Bariatric Nurse Coordinator: [phone]      Reviewed and Endorsed  by [facility] Patient Education Committee, June, 2016 Edits Approved: Aug, 2018   Gastrostomy Tube Home Guide, Adult A gastrostomy tube is a tube that is surgically placed into the stomach. It is also called a G-tube. G-tubes are used when a person is unable to eat and drink enough on their own to stay healthy. The tube is inserted into the  stomach through a small cut (incision) in the skin. This tube is used for:  Feeding.  Giving medication.  Gastrostomy tube care  Wash your hands with soap and water.  Remove the old dressing (if any). Some styles of G-tubes may need a dressing inserted between the skin and the G-tube. Other types of G-tubes do not require a dressing. Ask your health care provider if a dressing is needed.  Check the area where the tube enters the skin (insertion site) for redness, swelling, or pus-like (purulent) drainage. A small amount of clear or tan liquid drainage is normal. Check to make sure scar tissue (skin) is not growing around the insertion site. This could have a raised, bumpy appearance.  A cotton swab can be used to clean the skin around the tube: ? When the G-tube is first put in, a normal saline solution or water can be used to clean the skin. ? Mild soap and warm water can be used when the skin around the G-tube site has healed. ? Roll the cotton swab around the G-tube insertion site to remove any drainage or crusting at the insertion site. Stomach residuals Feeding tube residuals are the amount of liquids that are in the stomach at any given time. Residuals may be checked before giving feedings, medications, or as instructed by your health care provider.  Ask your health care provider if there are instances when you would not start tube feedings depending on the amount or type of contents withdrawn from the stomach.  Check residuals by attaching a syringe to the G-tube and pulling back on the syringe plunger. Note the amount, and return the residual back into the stomach.  Flushing the G-tube  The G-tube should be periodically flushed with clean warm water to keep it from clogging. ? Flush the G-tube after feedings or medications. Draw up 30 mL of warm water in a syringe. Connect the syringe to the G-tube and slowly push the water into the tube. ? Do not push feedings, medications, or  flushes rapidly. Flush the G-tube gently and slowly. ? Only use syringes made for G-tubes to flush medications or feedings. ? Your health care provider may want the G-tube flushed more often or with more water. If this is the case, follow your health care provider's instructions. Feedings Your health care provider will determine whether feedings are given as a bolus (a certain amount given at one time and at scheduled times) or whether feedings will be given continuously on a feeding pump.  Formulas should be given at room temperature.  If feedings are continuous, no more than 4 hours worth of feedings should be placed in the feeding bag. This helps prevent spoilage or accidental excess infusion.  Cover and place unused formula in the refrigerator.  If feedings are continuous, stop the feedings when medications or flushes are given. Be sure to restart the feedings.  Feeding bags and syringes should be replaced as instructed by your health care provider.  Giving medication  In general, it is best if all medications are in a liquid form for G-tube administration. Liquid medications are less likely to clog the G-tube. ? Mix the liquid medication with 30 mL (or amount recommended by your health care provider) of warm water. ? Draw up the medication into the syringe. ? Attach the syringe to the G-tube and slowly push the mixture into the G-tube. ? After giving the medication, draw up 30 mL of warm water in the syringe and slowly flush the G-tube.  For pills or capsules, check with your health care provider first before crushing medications. Some pills are  not effective if they are crushed. Some capsules are sustained-release medications. ? If appropriate, crush the pill or capsule and mix with 30 mL of warm water. Using the syringe, slowly push the medication through the tube, then flush the tube with another 30 mL of tap water. G-tube problems G-tube was pulled out.  Cause: May have been  pulled out accidentally.  Solutions: Cover the opening with clean dressing and tape. Call your health care provider right away. The G-tube should be put in as soon as possible (within 4 hours) so the G-tube opening (tract) does not close. The G-tube needs to be put in at a health care setting. An X-ray needs to be done to confirm placement before the G-tube can be used again.  Redness, irritation, soreness, or foul odor around the gastrostomy site.  Cause: May be caused by leakage or infection.  Solutions: Call your health care provider right away.  Large amount of leakage of fluid or mucus-like liquid present (a large amount means it soaks clothing).  Cause: Many reasons could cause the G-tube to leak.  Solutions: Call your health care provider to discuss the amount of leakage.  Skin or scar tissue appears to be growing where tube enters skin.  Cause: Tissue growth may develop around the insertion site if the G-tube is moved or pulled on excessively.  Solutions: Secure tube with tape so that excess movement does not occur. Call your health care provider.  G-tube is clogged.  Cause: Thick formula or medication.  Solutions: Try to slowly push warm water into the tube with a large syringe. Never try to push any object into the tube to unclog it. Do not force fluid into the G-tube. If you are unable to unclog the tube, call your health care provider right away.  Tips  Head of bed (HOB) position refers to the upright position of a person's upper body. ? When giving medications or a feeding bolus, keep the [facility] up as told by your health care provider. Do this during the feeding and for 1 hour after the feeding or medication administration. ? If continuous feedings are being given, it is best to keep the [facility] up as told by your health care provider. When ADLs (activities of daily living) are performed and the [facility] needs to be flat, be sure to turn the feeding pump off. Restart the feeding pump  when the [facility] is returned to the recommended height.  Do not pull or put tension on the tube.  To prevent fluid backflow, kink the G-tube before removing the cap or disconnecting a syringe.  Check the G-tube length every day. Measure from the insertion site to the end of the G-tube. If the length is longer than previous measurements, the tube may be coming out. Call your health care provider if you notice increasing G-tube length.  Oral care, such as brushing teeth, must be continued.  You may need to remove excess air (vent) from the G-tube. Your health care provider will tell you if this is needed.  Always call your health care provider if you have questions or problems with the G-tube. Get help right away if:  You have severe abdominal pain, tenderness, or abdominal bloating (distension).  You have nausea or vomiting.  You are constipated or have problems moving your bowels.  The G-tube insertion site is red, swollen, has a foul smell, or has yellow or brown drainage.  You have difficulty breathing or shortness of breath.  You have a  fever.  You have a large amount of feeding tube residuals.  The G-tube is clogged and cannot be flushed. This information is not intended to replace advice given to you by your health care provider. Make sure you discuss any questions you have with your health care provider. Document Released: 02/06/2002 Document Revised: 05/05/2016 Document Reviewed: 08/05/2013 Elsevier Interactive Patient Education  2017 [employer].

## 2018-06-22 ENCOUNTER — Telehealth: Payer: Self-pay | Admitting: Internal Medicine

## 2018-06-22 DIAGNOSIS — G4733 Obstructive sleep apnea (adult) (pediatric): Secondary | ICD-10-CM

## 2018-06-22 NOTE — Telephone Encounter (Signed)
Per CY ok to D/C o2 concentrator. Order has been placed. Nothing further needed. Patient aware.

## 2018-06-22 NOTE — Telephone Encounter (Signed)
D/C O2

## 2018-06-22 NOTE — Telephone Encounter (Signed)
Called and spoke with patient, she states that since she is no longer using her CPAP machine she would like to have her o2 concentrator picked up as well. Patient states she only used this with the CPAP machine.   CY please advise is it ok to d/c concentrator. Thank you.

## 2018-06-23 DIAGNOSIS — E538 Deficiency of other specified B group vitamins: Secondary | ICD-10-CM | POA: Diagnosis not present

## 2018-06-23 DIAGNOSIS — Z431 Encounter for attention to gastrostomy: Secondary | ICD-10-CM | POA: Diagnosis not present

## 2018-06-23 DIAGNOSIS — Z79891 Long term (current) use of opiate analgesic: Secondary | ICD-10-CM | POA: Diagnosis not present

## 2018-06-23 DIAGNOSIS — E1159 Type 2 diabetes mellitus with other circulatory complications: Secondary | ICD-10-CM | POA: Diagnosis not present

## 2018-06-23 DIAGNOSIS — K3184 Gastroparesis: Secondary | ICD-10-CM | POA: Diagnosis not present

## 2018-06-23 DIAGNOSIS — E559 Vitamin D deficiency, unspecified: Secondary | ICD-10-CM | POA: Diagnosis not present

## 2018-06-23 DIAGNOSIS — M170 Bilateral primary osteoarthritis of knee: Secondary | ICD-10-CM | POA: Diagnosis not present

## 2018-06-23 DIAGNOSIS — E441 Mild protein-calorie malnutrition: Secondary | ICD-10-CM | POA: Diagnosis not present

## 2018-06-23 DIAGNOSIS — Z79899 Other long term (current) drug therapy: Secondary | ICD-10-CM | POA: Diagnosis not present

## 2018-06-23 DIAGNOSIS — Z9884 Bariatric surgery status: Secondary | ICD-10-CM | POA: Diagnosis not present

## 2018-06-23 DIAGNOSIS — K5904 Chronic idiopathic constipation: Secondary | ICD-10-CM | POA: Diagnosis not present

## 2018-06-23 DIAGNOSIS — J9610 Chronic respiratory failure, unspecified whether with hypoxia or hypercapnia: Secondary | ICD-10-CM | POA: Diagnosis not present

## 2018-06-23 DIAGNOSIS — K2270 Barrett's esophagus without dysplasia: Secondary | ICD-10-CM | POA: Diagnosis not present

## 2018-06-23 DIAGNOSIS — K219 Gastro-esophageal reflux disease without esophagitis: Secondary | ICD-10-CM | POA: Diagnosis not present

## 2018-06-23 DIAGNOSIS — I10 Essential (primary) hypertension: Secondary | ICD-10-CM | POA: Diagnosis not present

## 2018-06-23 DIAGNOSIS — K449 Diaphragmatic hernia without obstruction or gangrene: Secondary | ICD-10-CM | POA: Diagnosis not present

## 2018-06-26 DIAGNOSIS — I10 Essential (primary) hypertension: Secondary | ICD-10-CM | POA: Diagnosis not present

## 2018-06-26 DIAGNOSIS — E559 Vitamin D deficiency, unspecified: Secondary | ICD-10-CM | POA: Diagnosis not present

## 2018-06-26 DIAGNOSIS — K5904 Chronic idiopathic constipation: Secondary | ICD-10-CM | POA: Diagnosis not present

## 2018-06-26 DIAGNOSIS — J9610 Chronic respiratory failure, unspecified whether with hypoxia or hypercapnia: Secondary | ICD-10-CM | POA: Diagnosis not present

## 2018-06-26 DIAGNOSIS — E538 Deficiency of other specified B group vitamins: Secondary | ICD-10-CM | POA: Diagnosis not present

## 2018-06-26 DIAGNOSIS — K3184 Gastroparesis: Secondary | ICD-10-CM | POA: Diagnosis not present

## 2018-06-26 DIAGNOSIS — K2270 Barrett's esophagus without dysplasia: Secondary | ICD-10-CM | POA: Diagnosis not present

## 2018-06-26 DIAGNOSIS — E1159 Type 2 diabetes mellitus with other circulatory complications: Secondary | ICD-10-CM | POA: Diagnosis not present

## 2018-06-26 DIAGNOSIS — Z79899 Other long term (current) drug therapy: Secondary | ICD-10-CM | POA: Diagnosis not present

## 2018-06-26 DIAGNOSIS — K449 Diaphragmatic hernia without obstruction or gangrene: Secondary | ICD-10-CM | POA: Diagnosis not present

## 2018-06-26 DIAGNOSIS — Z79891 Long term (current) use of opiate analgesic: Secondary | ICD-10-CM | POA: Diagnosis not present

## 2018-06-26 DIAGNOSIS — E441 Mild protein-calorie malnutrition: Secondary | ICD-10-CM | POA: Diagnosis not present

## 2018-06-26 DIAGNOSIS — Z9884 Bariatric surgery status: Secondary | ICD-10-CM | POA: Diagnosis not present

## 2018-06-26 DIAGNOSIS — Z431 Encounter for attention to gastrostomy: Secondary | ICD-10-CM | POA: Diagnosis not present

## 2018-06-26 DIAGNOSIS — K219 Gastro-esophageal reflux disease without esophagitis: Secondary | ICD-10-CM | POA: Diagnosis not present

## 2018-06-26 DIAGNOSIS — M170 Bilateral primary osteoarthritis of knee: Secondary | ICD-10-CM | POA: Diagnosis not present

## 2018-06-28 DIAGNOSIS — E441 Mild protein-calorie malnutrition: Secondary | ICD-10-CM | POA: Diagnosis not present

## 2018-06-28 DIAGNOSIS — E559 Vitamin D deficiency, unspecified: Secondary | ICD-10-CM | POA: Diagnosis not present

## 2018-06-28 DIAGNOSIS — K3184 Gastroparesis: Secondary | ICD-10-CM | POA: Diagnosis not present

## 2018-06-28 DIAGNOSIS — I10 Essential (primary) hypertension: Secondary | ICD-10-CM | POA: Diagnosis not present

## 2018-06-28 DIAGNOSIS — H40033 Anatomical narrow angle, bilateral: Secondary | ICD-10-CM | POA: Diagnosis not present

## 2018-06-28 DIAGNOSIS — J9610 Chronic respiratory failure, unspecified whether with hypoxia or hypercapnia: Secondary | ICD-10-CM | POA: Diagnosis not present

## 2018-06-28 DIAGNOSIS — Z9884 Bariatric surgery status: Secondary | ICD-10-CM | POA: Diagnosis not present

## 2018-06-28 DIAGNOSIS — H1013 Acute atopic conjunctivitis, bilateral: Secondary | ICD-10-CM | POA: Diagnosis not present

## 2018-06-28 DIAGNOSIS — K2270 Barrett's esophagus without dysplasia: Secondary | ICD-10-CM | POA: Diagnosis not present

## 2018-06-28 DIAGNOSIS — K449 Diaphragmatic hernia without obstruction or gangrene: Secondary | ICD-10-CM | POA: Diagnosis not present

## 2018-06-28 DIAGNOSIS — Z79891 Long term (current) use of opiate analgesic: Secondary | ICD-10-CM | POA: Diagnosis not present

## 2018-06-28 DIAGNOSIS — M170 Bilateral primary osteoarthritis of knee: Secondary | ICD-10-CM | POA: Diagnosis not present

## 2018-06-28 DIAGNOSIS — Z431 Encounter for attention to gastrostomy: Secondary | ICD-10-CM | POA: Diagnosis not present

## 2018-06-28 DIAGNOSIS — K219 Gastro-esophageal reflux disease without esophagitis: Secondary | ICD-10-CM | POA: Diagnosis not present

## 2018-06-28 DIAGNOSIS — E1159 Type 2 diabetes mellitus with other circulatory complications: Secondary | ICD-10-CM | POA: Diagnosis not present

## 2018-06-28 DIAGNOSIS — K5904 Chronic idiopathic constipation: Secondary | ICD-10-CM | POA: Diagnosis not present

## 2018-06-28 DIAGNOSIS — Z79899 Other long term (current) drug therapy: Secondary | ICD-10-CM | POA: Diagnosis not present

## 2018-06-28 DIAGNOSIS — E538 Deficiency of other specified B group vitamins: Secondary | ICD-10-CM | POA: Diagnosis not present

## 2018-07-03 ENCOUNTER — Encounter: Payer: Self-pay | Admitting: Neurology

## 2018-07-03 DIAGNOSIS — E538 Deficiency of other specified B group vitamins: Secondary | ICD-10-CM | POA: Diagnosis not present

## 2018-07-03 DIAGNOSIS — J9610 Chronic respiratory failure, unspecified whether with hypoxia or hypercapnia: Secondary | ICD-10-CM | POA: Diagnosis not present

## 2018-07-03 DIAGNOSIS — K449 Diaphragmatic hernia without obstruction or gangrene: Secondary | ICD-10-CM | POA: Diagnosis not present

## 2018-07-03 DIAGNOSIS — E441 Mild protein-calorie malnutrition: Secondary | ICD-10-CM | POA: Diagnosis not present

## 2018-07-03 DIAGNOSIS — K2270 Barrett's esophagus without dysplasia: Secondary | ICD-10-CM | POA: Diagnosis not present

## 2018-07-03 DIAGNOSIS — K3184 Gastroparesis: Secondary | ICD-10-CM | POA: Diagnosis not present

## 2018-07-03 DIAGNOSIS — I10 Essential (primary) hypertension: Secondary | ICD-10-CM | POA: Diagnosis not present

## 2018-07-03 DIAGNOSIS — K219 Gastro-esophageal reflux disease without esophagitis: Secondary | ICD-10-CM | POA: Diagnosis not present

## 2018-07-03 DIAGNOSIS — M170 Bilateral primary osteoarthritis of knee: Secondary | ICD-10-CM | POA: Diagnosis not present

## 2018-07-03 DIAGNOSIS — Z79899 Other long term (current) drug therapy: Secondary | ICD-10-CM | POA: Diagnosis not present

## 2018-07-03 DIAGNOSIS — E1159 Type 2 diabetes mellitus with other circulatory complications: Secondary | ICD-10-CM | POA: Diagnosis not present

## 2018-07-03 DIAGNOSIS — Z9884 Bariatric surgery status: Secondary | ICD-10-CM | POA: Diagnosis not present

## 2018-07-03 DIAGNOSIS — E559 Vitamin D deficiency, unspecified: Secondary | ICD-10-CM | POA: Diagnosis not present

## 2018-07-03 DIAGNOSIS — K5904 Chronic idiopathic constipation: Secondary | ICD-10-CM | POA: Diagnosis not present

## 2018-07-03 DIAGNOSIS — Z431 Encounter for attention to gastrostomy: Secondary | ICD-10-CM | POA: Diagnosis not present

## 2018-07-03 DIAGNOSIS — Z79891 Long term (current) use of opiate analgesic: Secondary | ICD-10-CM | POA: Diagnosis not present

## 2018-07-04 DIAGNOSIS — R1033 Periumbilical pain: Secondary | ICD-10-CM | POA: Diagnosis not present

## 2018-07-04 DIAGNOSIS — R1013 Epigastric pain: Secondary | ICD-10-CM | POA: Diagnosis not present

## 2018-07-05 DIAGNOSIS — Z79891 Long term (current) use of opiate analgesic: Secondary | ICD-10-CM | POA: Diagnosis not present

## 2018-07-05 DIAGNOSIS — K5904 Chronic idiopathic constipation: Secondary | ICD-10-CM | POA: Diagnosis not present

## 2018-07-05 DIAGNOSIS — Z431 Encounter for attention to gastrostomy: Secondary | ICD-10-CM | POA: Diagnosis not present

## 2018-07-05 DIAGNOSIS — I10 Essential (primary) hypertension: Secondary | ICD-10-CM | POA: Diagnosis not present

## 2018-07-05 DIAGNOSIS — Z79899 Other long term (current) drug therapy: Secondary | ICD-10-CM | POA: Diagnosis not present

## 2018-07-05 DIAGNOSIS — Z9884 Bariatric surgery status: Secondary | ICD-10-CM | POA: Diagnosis not present

## 2018-07-05 DIAGNOSIS — K449 Diaphragmatic hernia without obstruction or gangrene: Secondary | ICD-10-CM | POA: Diagnosis not present

## 2018-07-05 DIAGNOSIS — E559 Vitamin D deficiency, unspecified: Secondary | ICD-10-CM | POA: Diagnosis not present

## 2018-07-05 DIAGNOSIS — M170 Bilateral primary osteoarthritis of knee: Secondary | ICD-10-CM | POA: Diagnosis not present

## 2018-07-05 DIAGNOSIS — E538 Deficiency of other specified B group vitamins: Secondary | ICD-10-CM | POA: Diagnosis not present

## 2018-07-05 DIAGNOSIS — K219 Gastro-esophageal reflux disease without esophagitis: Secondary | ICD-10-CM | POA: Diagnosis not present

## 2018-07-05 DIAGNOSIS — J9610 Chronic respiratory failure, unspecified whether with hypoxia or hypercapnia: Secondary | ICD-10-CM | POA: Diagnosis not present

## 2018-07-05 DIAGNOSIS — K2270 Barrett's esophagus without dysplasia: Secondary | ICD-10-CM | POA: Diagnosis not present

## 2018-07-05 DIAGNOSIS — K3184 Gastroparesis: Secondary | ICD-10-CM | POA: Diagnosis not present

## 2018-07-05 DIAGNOSIS — E1159 Type 2 diabetes mellitus with other circulatory complications: Secondary | ICD-10-CM | POA: Diagnosis not present

## 2018-07-05 DIAGNOSIS — E441 Mild protein-calorie malnutrition: Secondary | ICD-10-CM | POA: Diagnosis not present

## 2018-07-09 ENCOUNTER — Other Ambulatory Visit: Payer: Self-pay | Admitting: Family Medicine

## 2018-07-11 ENCOUNTER — Ambulatory Visit: Payer: Medicare Other | Admitting: *Deleted

## 2018-07-11 DIAGNOSIS — K5904 Chronic idiopathic constipation: Secondary | ICD-10-CM | POA: Diagnosis not present

## 2018-07-11 DIAGNOSIS — M170 Bilateral primary osteoarthritis of knee: Secondary | ICD-10-CM | POA: Diagnosis not present

## 2018-07-11 DIAGNOSIS — Z431 Encounter for attention to gastrostomy: Secondary | ICD-10-CM | POA: Diagnosis not present

## 2018-07-11 DIAGNOSIS — Z79899 Other long term (current) drug therapy: Secondary | ICD-10-CM | POA: Diagnosis not present

## 2018-07-11 DIAGNOSIS — K449 Diaphragmatic hernia without obstruction or gangrene: Secondary | ICD-10-CM | POA: Diagnosis not present

## 2018-07-11 DIAGNOSIS — E441 Mild protein-calorie malnutrition: Secondary | ICD-10-CM | POA: Diagnosis not present

## 2018-07-11 DIAGNOSIS — I10 Essential (primary) hypertension: Secondary | ICD-10-CM | POA: Diagnosis not present

## 2018-07-11 DIAGNOSIS — K3184 Gastroparesis: Secondary | ICD-10-CM | POA: Diagnosis not present

## 2018-07-11 DIAGNOSIS — E559 Vitamin D deficiency, unspecified: Secondary | ICD-10-CM | POA: Diagnosis not present

## 2018-07-11 DIAGNOSIS — K219 Gastro-esophageal reflux disease without esophagitis: Secondary | ICD-10-CM | POA: Diagnosis not present

## 2018-07-11 DIAGNOSIS — J9610 Chronic respiratory failure, unspecified whether with hypoxia or hypercapnia: Secondary | ICD-10-CM | POA: Diagnosis not present

## 2018-07-11 DIAGNOSIS — E1159 Type 2 diabetes mellitus with other circulatory complications: Secondary | ICD-10-CM | POA: Diagnosis not present

## 2018-07-11 DIAGNOSIS — E538 Deficiency of other specified B group vitamins: Secondary | ICD-10-CM | POA: Diagnosis not present

## 2018-07-11 DIAGNOSIS — K2270 Barrett's esophagus without dysplasia: Secondary | ICD-10-CM | POA: Diagnosis not present

## 2018-07-11 DIAGNOSIS — Z9884 Bariatric surgery status: Secondary | ICD-10-CM | POA: Diagnosis not present

## 2018-07-11 DIAGNOSIS — Z79891 Long term (current) use of opiate analgesic: Secondary | ICD-10-CM | POA: Diagnosis not present

## 2018-07-16 ENCOUNTER — Ambulatory Visit (INDEPENDENT_AMBULATORY_CARE_PROVIDER_SITE_OTHER): Payer: Medicare Other | Admitting: *Deleted

## 2018-07-16 ENCOUNTER — Other Ambulatory Visit: Payer: Self-pay | Admitting: Neurology

## 2018-07-16 DIAGNOSIS — E538 Deficiency of other specified B group vitamins: Secondary | ICD-10-CM

## 2018-07-19 ENCOUNTER — Ambulatory Visit (INDEPENDENT_AMBULATORY_CARE_PROVIDER_SITE_OTHER): Payer: Medicare Other

## 2018-07-19 VITALS — BP 95/66 | HR 63 | Temp 97.4°F | Ht 64.0 in | Wt 125.0 lb

## 2018-07-19 DIAGNOSIS — Z0000 Encounter for general adult medical examination without abnormal findings: Secondary | ICD-10-CM | POA: Diagnosis not present

## 2018-07-19 NOTE — Progress Notes (Signed)
Subjective:   Morgan [name] is a 52 y.o. female who presents for Medicare Annual (Subsequent) preventive examination. She currently resides in [facility]. She has never been married and has no children. She lives alone but her niece and nieces husband and child are coming to live with her in the next couple of weeks. She seems very excited about this. She has many nieces and nephews and great nieces and nephews and is very involved with everything they do. She recently had gastric bypass surgery and has had several complications since then. She lost a huge amount of weight very quickly and continues to lose. According to her no one can figure out exactly why she keeps losing. They inserted a g tube last month and she is ready to have it removed. She agreed to keep it for 8 weeks. She has only used it a few times because she states that she doesn't want to become dependent on it. She used to do water aerobics 3 times a week but since her g tube insertion she has not been able to. She used to suffer from sleep apnea and had to use a cpap machine but since her surgery she no longer has to use. She is a very pleasant lady.  Review of Systems:   Cardiac Risk Factors include: diabetes mellitus;dyslipidemia;hypertension     Objective:     Vitals: BP 95/66   Pulse 63   Temp (!) 97.4 F (36.3 C) (Oral)   Ht 5\' 4"  (1.626 m)   Wt 125 lb (56.7 kg)   BMI 21.46 kg/m   Body mass index is 21.46 kg/m.  Advanced Directives 07/19/2018 06/19/2018 06/15/2018 07/10/2017 03/21/2017 03/07/2017 07/20/2016  Does Patient Have a Medical Advance Directive? Yes Yes Yes Yes Yes Yes Yes  Type of [place] administrator Power of [place] of [place] of [place] -  Does patient want to make changes to medical advance directive? No - Patient declined No - Patient declined No - Patient declined No - Patient  declined No - Patient declined No - Patient declined -  Copy of [place] in Chart? Yes - Yes No - copy requested - - -  Would patient like information on creating a medical advance directive? - - - - - - -    Patient states that she already has a power of attorney in place and that all funeral expenses are paid for.   Tobacco Social History   Tobacco Use  Smoking Status Never Smoker  Smokeless Tobacco Never Used     Patient is not a smoker  Clinical Intake:  Pre-visit preparation completed: No  Pain : 0-10 Pain Score: 5  Pain Location: Abdomen Pain Orientation: Left, Right Pain Descriptors / Indicators: Aching, Sharp Pain Onset: More than a month ago Pain Frequency: Constant Pain Relieving Factors: Nothing relieves the pain Effect of Pain on Daily Activities: Limits her in activities some days  Pain Relieving Factors: Nothing relieves the pain  BMI - recorded: 23.33 Nutritional Status: BMI of 19-24  Normal Nutritional Risks: Unintentional weight loss(massive weight loss and side effects since gastric bypass surgery) Diabetes: Yes CBG done?: No Did pt. bring in CBG monitor from home?: No  How often do you need to have someone help you when you read instructions, pamphlets, or other written materials from your doctor or pharmacy?: 1 - Never What is the last grade  level you completed in school?: 12th grade  Interpreter Needed?: No  Information entered by :: Theodoro Clock LPN  Past Medical History:  Diagnosis Date  . Allergic rhinitis   . Barrett's esophagus   . Bipolar affective disorder ([place])   . Chronic respiratory failure ([place])   . Constipation, chronic   . Diabetes mellitus without complication ([place])    type 2   . Dry eye   . Gastroparesis   . GERD (gastroesophageal reflux disease)   . History of bariatric surgery 03/2017  . Hyperlipidemia   . Hypertension   . Intractable chronic migraine without aura 06/04/2015  . Migraine headache   .  Morbid obesity ([place])   . OSA (obstructive sleep apnea)    uses a cpap with 2 units o2   . Osteoarthritis    bilateral knee  . Sleep apnea    has c-pap  . Unspecified hypothyroidism   . Vitamin B 12 deficiency   . Vitamin D deficiency    Past Surgical History:  Procedure Laterality Date  . APPENDECTOMY  1978  . CHOLECYSTECTOMY  2005  . GASTRIC ROUX-EN-Y N/A 03/21/2017   Procedure: LAPAROSCOPIC ROUX-EN-Y GASTRIC, UPPER ENDO;  Surgeon: Greer Pickerel, MD;  Location: [facility];  Service: General;  Laterality: N/A;  . GASTROSTOMY N/A 06/19/2018   Procedure: LAPRASCOPIC INSERTION OF GASTROSTOMY TUBE;  Surgeon: Greer Pickerel, MD;  Location: [facility];  Service: General;  Laterality: N/A;  . HERNIA REPAIR    . HIATAL HERNIA REPAIR N/A 06/19/2018   Procedure: LAPAROSCOPIC REPAIR OF HIATAL HERNIA;  Surgeon: Greer Pickerel, MD;  Location: [facility];  Service: General;  Laterality: N/A;  . LAPAROSCOPY N/A 06/19/2018   Procedure: LAPAROSCOPY DIAGNOSTIC;  Surgeon: Greer Pickerel, MD;  Location: [facility];  Service: General;  Laterality: N/A;  . SHOULDER ARTHROSCOPY  7/12   left-dsc  . TONSILLECTOMY  at age 58  . TRIGGER FINGER RELEASE  12/20/2012   Procedure: RELEASE TRIGGER FINGER/A-1 PULLEY;  Surgeon: Tennis Must, MD;  Location: [facility];  Service: Orthopedics;  Laterality: Left;  LEFT TRIGGER THUMB RELEASE   Family History  Problem Relation Age of Onset  . Asthma Father   . Allergies Father   . Heart disease Father        enlarged heart  . Peripheral vascular disease Father   . Diabetes Father   . Hyperlipidemia Father   . Arthritis Father   . Asthma Sister   . Cancer Sister        colon at 39 yr old.  . Colon cancer Sister   . Allergies Mother   . Stroke Mother 87       with hemi paralysis  . Diabetes Mother   . Hyperlipidemia Mother   . Hypertension Mother   . GI problems Mother   . Arthritis Mother   . Allergies Brother   . Early death Brother 9       congenital abormality  .  Allergies Sister   . Diabetes Sister   . Asthma Sister   . Colon polyps Sister   . Hyperlipidemia Sister   . GI problems Sister        gastroporesis   . Liver disease Sister        fatty liver  . Stroke Sister        intercrandial bleed  . Diabetes Brother   . Hypertension Brother   . Hyperlipidemia Brother   . Heart disease Paternal Aunt   .  Migraines Neg Hx    Social History   Socioeconomic History  . Marital status: Single    Spouse name: Not on file  . Number of children: 0  . Years of education: HS  . Highest education level: 12th grade  Occupational History  . Occupation: disabled    Fish farm manager: UNEMPLOYED  Social Needs  . Financial resource strain: Not hard at all  . Food insecurity:    Worry: Never true    Inability: Never true  . Transportation needs:    Medical: No    Non-medical: No  Tobacco Use  . Smoking status: Never Smoker  . Smokeless tobacco: Never Used  Substance and Sexual Activity  . Alcohol use: No  . Drug use: No  . Sexual activity: Never    Birth control/protection: None  Lifestyle  . Physical activity:    Days per week: 0 days    Minutes per session: 0 min  . Stress: Not at all  Relationships  . Social connections:    Talks on phone: More than three times a week    Gets together: More than three times a week    Attends religious service: More than 4 times per year    Active member of club or organization: No    Attends meetings of clubs or organizations: Never    Relationship status: Never married  Other Topics Concern  . Not on file  Social History Narrative   Patient is right handed.   Patient drinks 2 glasses of caffeine daily.    Outpatient Encounter Medications as of 07/19/2018  Medication Sig  . AIMOVIG 140 MG/ML SOAJ INJECT 140MG  (2ML) ONCE A MONTH AS DIRECTED  . diclofenac sodium (VOLTAREN) 1 % GEL Apply 2 g topically 2 (two) times daily as needed (JOINT PAIN). Use as directed for knee pain  . divalproex (DEPAKOTE) 500 MG  DR tablet TAKE 1 TABLET EVERY MORNING AND 2 TABLETS AT BEDTIME  . ezetimibe (ZETIA) 10 MG tablet TAKE 1 TABLET ONCE DAILY  . fluticasone (FLONASE) 50 MCG/ACT nasal spray SPRAY 1 SPRAY IN EACH NOSTRIL TWICE DAILY. SHAKE GENTLY BEFORE EACH [place] E. (Patient taking differently: SPRAY 1 SPRAY IN EACH NOSTRIL DAILY AS NEEDED . SHAKE GENTLY BEFORE EACH [place] E.)  . gabapentin (NEURONTIN) 300 MG capsule Take 2 capsules (600 mg total) 2 (two) times daily by mouth.  . levothyroxine (SYNTHROID, LEVOTHROID) 125 MCG tablet Take 125 mcg by mouth daily before breakfast.  . loratadine (CLARITIN) 10 MG tablet Take 10 mg by mouth daily as needed for allergies. Prn    . Multiple Vitamin (MULTIVITAMIN) capsule Take 1 capsule by mouth daily.   Marland Kitchen nystatin (NYAMYC) powder APPLY TOPICALLY TWICE A DAY AS NEEDED  . ONE TOUCH ULTRA TEST test strip CHECK BLOOD SUGAR TWICE A DAY AS DIRECTED  . OXYGEN Inhale 2 L into the lungs Nightly.  . pantoprazole (PROTONIX) 40 MG tablet Take 1 tablet by mouth daily.   Marland Kitchen PAZEO 0.7 % SOLN Place 1 drop into both eyes every morning.  . polyethylene glycol (MIRALAX / GLYCOLAX) packet Take 17 g by mouth daily as needed for mild constipation.   . Probiotic Product (HEALTHY COLON PO) Take 1 capsule by mouth daily.   . RESTASIS MULTIDOSE 0.05 % ophthalmic emulsion Place 1 drop into both eyes 2 (two) times daily.  . sertraline (ZOLOFT) 100 MG tablet TAKE (2) TABLETS DAILY  . tiZANidine (ZANAFLEX) 2 MG tablet Take 1 tablet (2 mg total) by mouth 3 (  three) times daily. (Patient taking differently: Take 2 mg by mouth every 8 (eight) hours as needed for muscle spasms. )  . traMADol (ULTRAM) 50 MG tablet Take 50 mg by mouth every 6 (six) hours.   . ziprasidone (GEODON) 60 MG capsule TAKE 1 CAPSULE IN THE EVENING   Facility-Administered Encounter Medications as of 07/19/2018  Medication  . cyanocobalamin ((VITAMIN B-12)) injection 1,000 mcg    Activities of Daily Living In your present state of health,  do you have any difficulty performing the following activities: 07/19/2018 06/19/2018  Hearing? N N  Vision? N N  Comment Patient used to wear glasses but does not need since losing weight -  Difficulty concentrating or making decisions? N N  Walking or climbing stairs? N Y  Comment - -  Dressing or bathing? N N  Doing errands, shopping? N N  Preparing Food and eating ? N -  Using the Toilet? N -  In the past six months, have you accidently leaked urine? N -  Do you have problems with loss of bowel control? N -  Managing your Medications? N -  Managing your Finances? N -  Housekeeping or managing your Housekeeping? N -  Some recent data might be hidden    Patient Care Team: Janora Norlander, DO as PCP - General (Family Medicine) Allyn Kenner, DO as Attending Physician (Obstetrics and Gynecology) Marchia Bond, MD as Attending Physician (Orthopedic Surgery) Mixon, Vinie Sill as Consulting Physician (Unknown Physician Specialty) Harlen Labs, MD as Referring Physician (Optometry) Deneise Lever, MD as Consulting Physician (Pulmonary Disease) Almedia Balls, MD as Consulting Physician (Orthopedic Surgery) Skeet Latch, MD as Attending Physician (Cardiology) Greer Pickerel, MD as Consulting Physician (General Surgery) Consuella Lose, MD as Consulting Physician (Neurosurgery)    Assessment:   This is a routine wellness examination for Lori.  Exercise Activities and Dietary recommendations Current Exercise Habits: The patient does not participate in regular exercise at present, Exercise limited by: Other - see comments(Patient currently has a feeding tube in place)  Goals    . Exercise 150 min/wk Moderate Activity    . Have 3 meals a day       Fall Risk Fall Risk  07/19/2018 05/10/2018 01/08/2018 10/06/2017 07/10/2017  Falls in the past year? No No Yes No No  Number falls in past yr: - - 1 - -  Injury with Fall? - - No - -   Is the patient's home free of loose throw  rugs in walkways, pet beds, electrical cords, etc?   yes      Grab bars in the bathroom? yes      Handrails on the stairs?   Patient lives in a mobile home and has no stairs      Adequate lighting?   yes    Depression Screen PHQ 2/9 Scores 07/19/2018 05/10/2018 01/08/2018 10/06/2017  PHQ - 2 Score 0 0 6 2  PHQ- 9 Score - - 14 4     Cognitive Function MMSE - Mini Mental State Exam 07/19/2018 07/10/2017 06/29/2016  Orientation to time 5 5 5   Orientation to Place 5 5 5   Registration 3 3 3   Attention/ Calculation 5 5 5   Recall 3 3 3   Language- name 2 objects 2 2 2   Language- repeat 1 1 1   Language- follow 3 step command 3 3 3   Language- read & follow direction 1 1 1   Write a sentence 1 1 1   Copy design 1 1  1  Total score 30 30 30       Patient had no problem completing MMSE  Immunization History  Administered Date(s) Administered  . Influenza,inj,Quad PF,6+ Mos 09/29/2016, 09/07/2017  . Pneumococcal Polysaccharide-23 06/29/2016  . Tdap 07/09/2015  . Zoster Recombinat (Shingrix) 10/06/2017, 01/04/2018    Qualifies for Shingles Vaccine? Patient has already shingrix vaccine  Screening Tests Health Maintenance  Topic Date Due  . HIV Screening  08/10/1982  . MAMMOGRAM  05/26/2017  . OPHTHALMOLOGY EXAM  04/18/2018  . PAP SMEAR  05/28/2018  . FOOT EXAM  07/10/2018  . URINE MICROALBUMIN  07/10/2018  . INFLUENZA VACCINE  07/12/2018  . HEMOGLOBIN A1C  12/16/2018  . PNEUMOCOCCAL POLYSACCHARIDE VACCINE (2) 06/29/2021  . COLONOSCOPY  10/18/2022  . TETANUS/TDAP  07/08/2025   Patient states that she had a pap smear last year but unable to locate documentation  Cancer Screenings: Lung: Low Dose CT Chest recommended if Age 89-80 years, 30 pack-year currently smoking OR have quit w/in 15years. Patient does not qualify. Breast:  Up to date on Mammogram? No   Up to date of Bone Density/Dexa? Patient does not qualify for this exam Colorectal: Patient in up to date on this  Additional  Screenings:  Hepatitis C Screening: Patient does not fall within the guidelines of this exam     Plan:     I have personally reviewed and noted the following in the patient's chart:   . Medical and social history . Use of alcohol, tobacco or illicit drugs  . Current medications and supplements . Functional ability and status . Nutritional status . Physical activity . Advanced directives . List of other physicians . Hospitalizations, surgeries, and ER visits in previous 12 months . Vitals . Screenings to include cognitive, depression, and falls . Referrals and appointments  In addition, I have reviewed and discussed with patient certain preventive protocols, quality metrics, and best practice recommendations. A written personalized care plan for preventive services as well as general preventive health recommendations were provided to patient.     Rolena Infante, LPN  01/16/3663

## 2018-07-19 NOTE — Patient Instructions (Signed)
  Morgan [name] , Thank you for taking time to come for your Medicare Wellness Visit. I appreciate your ongoing commitment to your health goals. Please review the following plan we discussed and let me know if I can assist you in the future.   These are the goals we discussed: Goals    . Exercise 150 min/wk Moderate Activity    . Have 3 meals a day       This is a list of the screening recommended for you and due dates:  Health Maintenance  Topic Date Due  . HIV Screening  08/10/1982  . Mammogram  05/26/2017  . Eye exam for diabetics  04/18/2018  . Pap Smear  05/28/2018  . Complete foot exam   07/10/2018  . Urine Protein Check  07/10/2018  . Flu Shot  07/12/2018  . Hemoglobin A1C  12/16/2018  . Pneumococcal vaccine (2) 06/29/2021  . Colon Cancer Screening  10/18/2022  . Tetanus Vaccine  07/08/2025

## 2018-07-20 ENCOUNTER — Inpatient Hospital Stay (HOSPITAL_COMMUNITY)
Admission: EM | Admit: 2018-07-20 | Discharge: 2018-07-24 | DRG: 871 | Disposition: A | Discharge status: Home / Self Care | Payer: Medicare Other | Attending: Internal Medicine | Admitting: Internal Medicine

## 2018-07-20 ENCOUNTER — Inpatient Hospital Stay (HOSPITAL_COMMUNITY): Payer: Medicare Other

## 2018-07-20 ENCOUNTER — Emergency Department (HOSPITAL_COMMUNITY): Payer: Medicare Other

## 2018-07-20 ENCOUNTER — Encounter (HOSPITAL_COMMUNITY): Payer: Self-pay

## 2018-07-20 ENCOUNTER — Other Ambulatory Visit: Payer: Self-pay

## 2018-07-20 DIAGNOSIS — J159 Unspecified bacterial pneumonia: Secondary | ICD-10-CM | POA: Diagnosis not present

## 2018-07-20 DIAGNOSIS — R6521 Severe sepsis with septic shock: Secondary | ICD-10-CM | POA: Diagnosis present

## 2018-07-20 DIAGNOSIS — M542 Cervicalgia: Secondary | ICD-10-CM | POA: Diagnosis not present

## 2018-07-20 DIAGNOSIS — R109 Unspecified abdominal pain: Secondary | ICD-10-CM

## 2018-07-20 DIAGNOSIS — E872 Acidosis: Secondary | ICD-10-CM | POA: Diagnosis not present

## 2018-07-20 DIAGNOSIS — R1084 Generalized abdominal pain: Secondary | ICD-10-CM

## 2018-07-20 DIAGNOSIS — I959 Hypotension, unspecified: Secondary | ICD-10-CM | POA: Diagnosis not present

## 2018-07-20 DIAGNOSIS — R945 Abnormal results of liver function studies: Secondary | ICD-10-CM | POA: Diagnosis not present

## 2018-07-20 DIAGNOSIS — Y95 Nosocomial condition: Secondary | ICD-10-CM | POA: Diagnosis present

## 2018-07-20 DIAGNOSIS — A419 Sepsis, unspecified organism: Principal | ICD-10-CM | POA: Diagnosis present

## 2018-07-20 DIAGNOSIS — I10 Essential (primary) hypertension: Secondary | ICD-10-CM | POA: Diagnosis present

## 2018-07-20 DIAGNOSIS — K219 Gastro-esophageal reflux disease without esophagitis: Secondary | ICD-10-CM | POA: Diagnosis present

## 2018-07-20 DIAGNOSIS — R001 Bradycardia, unspecified: Secondary | ICD-10-CM | POA: Diagnosis not present

## 2018-07-20 DIAGNOSIS — E876 Hypokalemia: Secondary | ICD-10-CM | POA: Diagnosis not present

## 2018-07-20 DIAGNOSIS — E785 Hyperlipidemia, unspecified: Secondary | ICD-10-CM | POA: Diagnosis not present

## 2018-07-20 DIAGNOSIS — R7989 Other specified abnormal findings of blood chemistry: Secondary | ICD-10-CM

## 2018-07-20 DIAGNOSIS — J9811 Atelectasis: Secondary | ICD-10-CM

## 2018-07-20 DIAGNOSIS — E1143 Type 2 diabetes mellitus with diabetic autonomic (poly)neuropathy: Secondary | ICD-10-CM | POA: Diagnosis not present

## 2018-07-20 DIAGNOSIS — R918 Other nonspecific abnormal finding of lung field: Secondary | ICD-10-CM | POA: Diagnosis not present

## 2018-07-20 DIAGNOSIS — J189 Pneumonia, unspecified organism: Secondary | ICD-10-CM

## 2018-07-20 DIAGNOSIS — S199XXA Unspecified injury of neck, initial encounter: Secondary | ICD-10-CM | POA: Diagnosis not present

## 2018-07-20 DIAGNOSIS — K3184 Gastroparesis: Secondary | ICD-10-CM | POA: Diagnosis present

## 2018-07-20 DIAGNOSIS — D696 Thrombocytopenia, unspecified: Secondary | ICD-10-CM | POA: Diagnosis not present

## 2018-07-20 DIAGNOSIS — E119 Type 2 diabetes mellitus without complications: Secondary | ICD-10-CM | POA: Diagnosis not present

## 2018-07-20 DIAGNOSIS — Z6821 Body mass index (BMI) 21.0-21.9, adult: Secondary | ICD-10-CM

## 2018-07-20 DIAGNOSIS — R0602 Shortness of breath: Secondary | ICD-10-CM

## 2018-07-20 DIAGNOSIS — E440 Moderate protein-calorie malnutrition: Secondary | ICD-10-CM | POA: Diagnosis not present

## 2018-07-20 DIAGNOSIS — J9622 Acute and chronic respiratory failure with hypercapnia: Secondary | ICD-10-CM | POA: Diagnosis not present

## 2018-07-20 DIAGNOSIS — N179 Acute kidney failure, unspecified: Secondary | ICD-10-CM | POA: Diagnosis not present

## 2018-07-20 DIAGNOSIS — J155 Pneumonia due to Escherichia coli: Secondary | ICD-10-CM | POA: Diagnosis not present

## 2018-07-20 DIAGNOSIS — S0993XA Unspecified injury of face, initial encounter: Secondary | ICD-10-CM | POA: Diagnosis not present

## 2018-07-20 DIAGNOSIS — N390 Urinary tract infection, site not specified: Secondary | ICD-10-CM

## 2018-07-20 DIAGNOSIS — J9690 Respiratory failure, unspecified, unspecified whether with hypoxia or hypercapnia: Secondary | ICD-10-CM | POA: Diagnosis not present

## 2018-07-20 DIAGNOSIS — E861 Hypovolemia: Secondary | ICD-10-CM | POA: Diagnosis not present

## 2018-07-20 DIAGNOSIS — J9621 Acute and chronic respiratory failure with hypoxia: Secondary | ICD-10-CM | POA: Diagnosis not present

## 2018-07-20 DIAGNOSIS — J181 Lobar pneumonia, unspecified organism: Secondary | ICD-10-CM

## 2018-07-20 DIAGNOSIS — E878 Other disorders of electrolyte and fluid balance, not elsewhere classified: Secondary | ICD-10-CM | POA: Diagnosis present

## 2018-07-20 DIAGNOSIS — E039 Hypothyroidism, unspecified: Secondary | ICD-10-CM | POA: Diagnosis present

## 2018-07-20 DIAGNOSIS — W1830XA Fall on same level, unspecified, initial encounter: Secondary | ICD-10-CM | POA: Diagnosis not present

## 2018-07-20 DIAGNOSIS — R531 Weakness: Secondary | ICD-10-CM | POA: Diagnosis not present

## 2018-07-20 DIAGNOSIS — D638 Anemia in other chronic diseases classified elsewhere: Secondary | ICD-10-CM | POA: Diagnosis present

## 2018-07-20 DIAGNOSIS — R740 Nonspecific elevation of levels of transaminase and lactic acid dehydrogenase [LDH]: Secondary | ICD-10-CM

## 2018-07-20 DIAGNOSIS — E162 Hypoglycemia, unspecified: Secondary | ICD-10-CM | POA: Diagnosis not present

## 2018-07-20 DIAGNOSIS — J9600 Acute respiratory failure, unspecified whether with hypoxia or hypercapnia: Secondary | ICD-10-CM

## 2018-07-20 DIAGNOSIS — Z885 Allergy status to narcotic agent status: Secondary | ICD-10-CM

## 2018-07-20 DIAGNOSIS — G4733 Obstructive sleep apnea (adult) (pediatric): Secondary | ICD-10-CM | POA: Diagnosis not present

## 2018-07-20 DIAGNOSIS — Z9884 Bariatric surgery status: Secondary | ICD-10-CM

## 2018-07-20 DIAGNOSIS — W19XXXA Unspecified fall, initial encounter: Secondary | ICD-10-CM | POA: Diagnosis not present

## 2018-07-20 DIAGNOSIS — S0990XA Unspecified injury of head, initial encounter: Secondary | ICD-10-CM | POA: Diagnosis not present

## 2018-07-20 DIAGNOSIS — F319 Bipolar disorder, unspecified: Secondary | ICD-10-CM | POA: Diagnosis present

## 2018-07-20 DIAGNOSIS — E11649 Type 2 diabetes mellitus with hypoglycemia without coma: Secondary | ICD-10-CM | POA: Diagnosis present

## 2018-07-20 DIAGNOSIS — J90 Pleural effusion, not elsewhere classified: Secondary | ICD-10-CM | POA: Diagnosis not present

## 2018-07-20 DIAGNOSIS — I951 Orthostatic hypotension: Secondary | ICD-10-CM | POA: Diagnosis not present

## 2018-07-20 DIAGNOSIS — Z888 Allergy status to other drugs, medicaments and biological substances status: Secondary | ICD-10-CM

## 2018-07-20 DIAGNOSIS — Z79899 Other long term (current) drug therapy: Secondary | ICD-10-CM

## 2018-07-20 DIAGNOSIS — Y92009 Unspecified place in unspecified non-institutional (private) residence as the place of occurrence of the external cause: Secondary | ICD-10-CM | POA: Diagnosis not present

## 2018-07-20 HISTORY — DX: Pneumonia, unspecified organism: J18.9

## 2018-07-20 LAB — CBC WITH DIFFERENTIAL/PLATELET
BASOS PCT: 0 %
Basophils Absolute: 0 10*3/uL (ref 0.0–0.1)
EOS ABS: 0 10*3/uL (ref 0.0–0.7)
Eosinophils Relative: 0 %
HCT: 35.6 % — ABNORMAL LOW (ref 36.0–46.0)
Hemoglobin: 11.3 g/dL — ABNORMAL LOW (ref 12.0–15.0)
Lymphocytes Relative: 6 %
Lymphs Abs: 0.2 10*3/uL — ABNORMAL LOW (ref 0.7–4.0)
MCH: 30.4 pg (ref 26.0–34.0)
MCHC: 31.7 g/dL (ref 30.0–36.0)
MCV: 95.7 fL (ref 78.0–100.0)
MONO ABS: 0.1 10*3/uL (ref 0.1–1.0)
MONOS PCT: 4 %
NEUTROS PCT: 90 %
Neutro Abs: 2.7 10*3/uL (ref 1.7–7.7)
PLATELETS: 122 10*3/uL — AB (ref 150–400)
RBC: 3.72 MIL/uL — ABNORMAL LOW (ref 3.87–5.11)
RDW: 13.4 % (ref 11.5–15.5)
WBC: 3 10*3/uL — ABNORMAL LOW (ref 4.0–10.5)

## 2018-07-20 LAB — COMPREHENSIVE METABOLIC PANEL
ALBUMIN: 2.4 g/dL — AB (ref 3.5–5.0)
ALT: 165 U/L — ABNORMAL HIGH (ref 0–44)
ALT: 158 U/L — AB (ref 0–44)
AST: 333 U/L — AB (ref 15–41)
AST: 214 U/L — AB (ref 15–41)
Albumin: 2.8 g/dL — ABNORMAL LOW (ref 3.5–5.0)
Alkaline Phosphatase: 41 U/L (ref 38–126)
Alkaline Phosphatase: 37 U/L — ABNORMAL LOW (ref 38–126)
Anion gap: 6 (ref 5–15)
Anion gap: 9 (ref 5–15)
BILIRUBIN TOTAL: 0.4 mg/dL (ref 0.3–1.2)
BUN: 33 mg/dL — AB (ref 6–20)
BUN: 28 mg/dL — ABNORMAL HIGH (ref 6–20)
CO2: 23 mmol/L (ref 22–32)
CO2: 24 mmol/L (ref 22–32)
CREATININE: 1.05 mg/dL — AB (ref 0.44–1.00)
CREATININE: 0.8 mg/dL (ref 0.44–1.00)
Calcium: 6.5 mg/dL — ABNORMAL LOW (ref 8.9–10.3)
Calcium: 7.2 mg/dL — ABNORMAL LOW (ref 8.9–10.3)
Chloride: 116 mmol/L — ABNORMAL HIGH (ref 98–111)
Chloride: 112 mmol/L — ABNORMAL HIGH (ref 98–111)
GFR calc Af Amer: >60 mL/min (ref >60)
GFR calc Af Amer: >60 mL/min (ref >60)
GFR calc non Af Amer: >60 mL/min (ref >60)
GLUCOSE: 98 mg/dL (ref 70–99)
Glucose, Bld: 112 mg/dL — ABNORMAL HIGH (ref 70–99)
POTASSIUM: 2.9 mmol/L — AB (ref 3.5–5.1)
POTASSIUM: 3.6 mmol/L (ref 3.5–5.1)
Sodium: 145 mmol/L (ref 135–145)
Sodium: 145 mmol/L (ref 135–145)
TOTAL PROTEIN: 4.9 g/dL — AB (ref 6.5–8.1)
Total Bilirubin: 0.4 mg/dL (ref 0.3–1.2)
Total Protein: 4.9 g/dL — ABNORMAL LOW (ref 6.5–8.1)

## 2018-07-20 LAB — PROCALCITONIN: Procalcitonin: 37.17 ng/mL

## 2018-07-20 LAB — BLOOD GAS, VENOUS
Acid-base deficit: 3 mmol/L — ABNORMAL HIGH (ref 0.0–2.0)
BICARBONATE: 26.1 mmol/L (ref 20.0–28.0)
O2 Saturation: 24.2 %
PH VEN: 7.19 — AB (ref 7.250–7.430)
Patient temperature: 98.6
pCO2, Ven: 71.1 mmHg (ref 44.0–60.0)

## 2018-07-20 LAB — URINALYSIS, ROUTINE W REFLEX MICROSCOPIC
Glucose, UA: NEGATIVE mg/dL
Ketones, ur: 5 mg/dL — AB
Leukocytes, UA: NEGATIVE
Nitrite: NEGATIVE
Protein, ur: 100 mg/dL — AB
SPECIFIC GRAVITY, URINE: 1.017 (ref 1.005–1.030)
pH: 5 (ref 5.0–8.0)

## 2018-07-20 LAB — CBG MONITORING, ED
GLUCOSE-CAPILLARY: 92 mg/dL (ref 70–99)
Glucose-Capillary: 106 mg/dL — ABNORMAL HIGH (ref 70–99)
Glucose-Capillary: 71 mg/dL (ref 70–99)

## 2018-07-20 LAB — I-STAT TROPONIN, ED: Troponin i, poc: 0.01 ng/mL (ref 0.00–0.08)

## 2018-07-20 LAB — PROTIME-INR
INR: 1.17
PROTHROMBIN TIME: 14.8 s (ref 11.4–15.2)

## 2018-07-20 LAB — MAGNESIUM: MAGNESIUM: 1.8 mg/dL (ref 1.7–2.4)

## 2018-07-20 LAB — LACTIC ACID, PLASMA: Lactic Acid, Venous: 1.1 mmol/L (ref 0.5–1.9)

## 2018-07-20 LAB — BLOOD GAS, ARTERIAL
ACID-BASE EXCESS: 0.4 mmol/L (ref 0.0–2.0)
BICARBONATE: 24.8 mmol/L (ref 20.0–28.0)
Drawn by: 308601
O2 Content: 2 L/min
O2 Saturation: 94.3 %
PCO2 ART: 42.1 mmHg (ref 32.0–48.0)
PH ART: 7.388 (ref 7.350–7.450)
PO2 ART: 74.2 mmHg — AB (ref 83.0–108.0)
Patient temperature: 99

## 2018-07-20 LAB — MRSA PCR SCREENING: MRSA BY PCR: NEGATIVE

## 2018-07-20 LAB — I-STAT BETA HCG BLOOD, ED (MC, WL, AP ONLY)

## 2018-07-20 LAB — I-STAT CG4 LACTIC ACID, ED
LACTIC ACID, VENOUS: 3.49 mmol/L — AB (ref 0.5–1.9)
Lactic Acid, Venous: 1.61 mmol/L (ref 0.5–1.9)

## 2018-07-20 LAB — GLUCOSE, CAPILLARY
Glucose-Capillary: 104 mg/dL — ABNORMAL HIGH (ref 70–99)
Glucose-Capillary: 63 mg/dL — ABNORMAL LOW (ref 70–99)

## 2018-07-20 LAB — TSH: TSH: 0.487 u[IU]/mL (ref 0.350–4.500)

## 2018-07-20 LAB — PHOSPHORUS: Phosphorus: 3.2 mg/dL (ref 2.5–4.6)

## 2018-07-20 LAB — STREP PNEUMONIAE URINARY ANTIGEN: Strep Pneumo Urinary Antigen: NEGATIVE

## 2018-07-20 LAB — VALPROIC ACID LEVEL: Valproic Acid Lvl: 50 ug/mL (ref 50.0–100.0)

## 2018-07-20 LAB — CORTISOL: CORTISOL PLASMA: 33.4 ug/dL

## 2018-07-20 MED ORDER — POTASSIUM CHLORIDE CRYS ER 20 MEQ PO TBCR
40.0000 meq | EXTENDED_RELEASE_TABLET | ORAL | Status: AC
  Administered 2018-07-20 (×2): 40 meq via ORAL
  Filled 2018-07-20 (×2): qty 2

## 2018-07-20 MED ORDER — DIVALPROEX SODIUM 500 MG PO DR TAB
1000.0000 mg | DELAYED_RELEASE_TABLET | Freq: Every day | ORAL | Status: DC
  Administered 2018-07-20: 1000 mg via ORAL
  Filled 2018-07-20: qty 2
  Filled 2018-07-20: qty 4

## 2018-07-20 MED ORDER — TIZANIDINE HCL 4 MG PO TABS: 2.0000 mg | ORAL_TABLET | Freq: Three times a day (TID) | ORAL | Status: DC | PRN

## 2018-07-20 MED ORDER — INSULIN ASPART 100 UNIT/ML ~~LOC~~ SOLN
1.0000 [IU] | SUBCUTANEOUS | Status: DC
  Administered 2018-07-23: 1 [IU] via SUBCUTANEOUS

## 2018-07-20 MED ORDER — SODIUM CHLORIDE 0.9 % IV BOLUS
500.0000 mL | Freq: Once | INTRAVENOUS | Status: AC
  Administered 2018-07-20: 500 mL via INTRAVENOUS

## 2018-07-20 MED ORDER — ZIPRASIDONE HCL 20 MG PO CAPS
60.0000 mg | ORAL_CAPSULE | Freq: Every evening | ORAL | Status: DC
  Administered 2018-07-20: 60 mg via ORAL
  Filled 2018-07-20 (×2): qty 3

## 2018-07-20 MED ORDER — SODIUM CHLORIDE 0.9 % IV SOLN: INTRAVENOUS | Status: DC | PRN

## 2018-07-20 MED ORDER — PANTOPRAZOLE SODIUM 40 MG PO TBEC: 40.0000 mg | DELAYED_RELEASE_TABLET | Freq: Every day | ORAL | Status: DC

## 2018-07-20 MED ORDER — SODIUM CHLORIDE 0.9 % IV SOLN: 500.0000 mg | INTRAVENOUS | Status: DC

## 2018-07-20 MED ORDER — FAMOTIDINE IN NACL 20-0.9 MG/50ML-% IV SOLN
20.0000 mg | Freq: Two times a day (BID) | INTRAVENOUS | Status: DC
  Administered 2018-07-20 – 2018-07-21 (×2): 20 mg via INTRAVENOUS
  Filled 2018-07-20 (×2): qty 50

## 2018-07-20 MED ORDER — VANCOMYCIN HCL IN DEXTROSE 1-5 GM/200ML-% IV SOLN
1000.0000 mg | Freq: Once | INTRAVENOUS | Status: AC
  Administered 2018-07-20: 1000 mg via INTRAVENOUS
  Filled 2018-07-20: qty 200

## 2018-07-20 MED ORDER — DIVALPROEX SODIUM 500 MG PO DR TAB
500.0000 mg | DELAYED_RELEASE_TABLET | Freq: Every day | ORAL | Status: DC
  Filled 2018-07-20: qty 1

## 2018-07-20 MED ORDER — OLOPATADINE HCL 0.1 % OP SOLN
1.0000 [drp] | Freq: Every morning | OPHTHALMIC | Status: DC
  Administered 2018-07-21 – 2018-07-24 (×4): 1 [drp] via OPHTHALMIC
  Filled 2018-07-20: qty 5

## 2018-07-20 MED ORDER — PIPERACILLIN-TAZOBACTAM 3.375 G IVPB
3.3750 g | Freq: Three times a day (TID) | INTRAVENOUS | Status: DC
  Administered 2018-07-20 – 2018-07-22 (×5): 3.375 g via INTRAVENOUS
  Filled 2018-07-20 (×5): qty 50

## 2018-07-20 MED ORDER — SODIUM CHLORIDE 0.9 % IV SOLN
INTRAVENOUS | Status: DC | PRN
  Administered 2018-07-22: 250 mL via INTRAVENOUS

## 2018-07-20 MED ORDER — DEXTROSE 50 % IV SOLN
INTRAVENOUS | Status: AC
  Administered 2018-07-20: 23:00:00
  Filled 2018-07-20: qty 50

## 2018-07-20 MED ORDER — VANCOMYCIN HCL IN DEXTROSE 1-5 GM/200ML-% IV SOLN
1000.0000 mg | INTRAVENOUS | Status: DC
  Administered 2018-07-21: 1000 mg via INTRAVENOUS
  Filled 2018-07-20: qty 200

## 2018-07-20 MED ORDER — SODIUM CHLORIDE 0.9 % IV SOLN
1.0000 g | INTRAVENOUS | Status: DC
  Administered 2018-07-20: 1 g via INTRAVENOUS
  Filled 2018-07-20: qty 1

## 2018-07-20 MED ORDER — GABAPENTIN 300 MG PO CAPS
600.0000 mg | ORAL_CAPSULE | Freq: Two times a day (BID) | ORAL | Status: DC
  Filled 2018-07-20: qty 2

## 2018-07-20 MED ORDER — ACETAMINOPHEN 650 MG RE SUPP
650.0000 mg | Freq: Four times a day (QID) | RECTAL | Status: DC | PRN
  Administered 2018-07-21 – 2018-07-23 (×4): 650 mg via RECTAL
  Filled 2018-07-20 (×4): qty 1

## 2018-07-20 MED ORDER — TRAMADOL HCL 50 MG PO TABS
50.0000 mg | ORAL_TABLET | Freq: Four times a day (QID) | ORAL | Status: DC
Start: 2018-07-20 — End: 2018-07-20
  Administered 2018-07-20: 50 mg via ORAL
  Filled 2018-07-20: qty 1

## 2018-07-20 MED ORDER — ENOXAPARIN SODIUM 40 MG/0.4ML ~~LOC~~ SOLN
40.0000 mg | SUBCUTANEOUS | Status: DC
  Administered 2018-07-20 – 2018-07-23 (×4): 40 mg via SUBCUTANEOUS
  Filled 2018-07-20 (×4): qty 0.4

## 2018-07-20 MED ORDER — LEVOTHYROXINE SODIUM 125 MCG PO TABS
125.0000 ug | ORAL_TABLET | Freq: Every day | ORAL | Status: DC
  Filled 2018-07-20: qty 1

## 2018-07-20 MED ORDER — SODIUM CHLORIDE 0.9 % IV BOLUS
1000.0000 mL | Freq: Once | INTRAVENOUS | Status: AC
  Administered 2018-07-20: 1000 mL via INTRAVENOUS

## 2018-07-20 MED ORDER — SERTRALINE HCL 50 MG PO TABS: 100.0000 mg | ORAL_TABLET | Freq: Every day | ORAL | Status: DC

## 2018-07-20 MED ORDER — LACTATED RINGERS IV SOLN
INTRAVENOUS | Status: DC
  Administered 2018-07-20 – 2018-07-21 (×3): via INTRAVENOUS
  Administered 2018-07-22: 125 mL/h via INTRAVENOUS
  Administered 2018-07-22 (×3): via INTRAVENOUS

## 2018-07-20 MED ORDER — CYCLOSPORINE 0.05 % OP EMUL
1.0000 [drp] | Freq: Two times a day (BID) | OPHTHALMIC | Status: DC
  Administered 2018-07-20 – 2018-07-24 (×8): 1 [drp] via OPHTHALMIC
  Filled 2018-07-20 (×8): qty 1

## 2018-07-20 MED ORDER — SODIUM CHLORIDE 0.9 % IV SOLN
1000.0000 mL | INTRAVENOUS | Status: DC
  Administered 2018-07-20: 1000 mL via INTRAVENOUS

## 2018-07-20 MED ORDER — PIPERACILLIN-TAZOBACTAM 3.375 G IVPB 30 MIN
3.3750 g | Freq: Once | INTRAVENOUS | Status: AC
  Administered 2018-07-20: 3.375 g via INTRAVENOUS
  Filled 2018-07-20: qty 50

## 2018-07-20 MED ORDER — SODIUM CHLORIDE 0.9 % IV SOLN
INTRAVENOUS | Status: DC
  Administered 2018-07-20: 19:00:00 via INTRAVENOUS

## 2018-07-20 MED ORDER — NOREPINEPHRINE 4 MG/250ML-% IV SOLN
0.0000 ug/min | INTRAVENOUS | Status: DC
  Administered 2018-07-20: 2 ug/min via INTRAVENOUS
  Filled 2018-07-20 (×2): qty 250

## 2018-07-20 MED ORDER — LEVOTHYROXINE SODIUM 100 MCG IV SOLR
62.5000 ug | Freq: Every day | INTRAVENOUS | Status: DC
  Administered 2018-07-21: 62.5 ug via INTRAVENOUS
  Filled 2018-07-20: qty 5

## 2018-07-20 MED ORDER — SODIUM CHLORIDE 0.9 % IV SOLN
100.0000 mg | Freq: Two times a day (BID) | INTRAVENOUS | Status: DC
  Administered 2018-07-20: 100 mg via INTRAVENOUS
  Filled 2018-07-20 (×2): qty 100

## 2018-07-20 NOTE — Care Management Note (Addendum)
Case Management Note  CM noted pt was with [facility] RN.  Contacted [facility] to advise of pt's admission who advised pt was D/C from their [facility] services on 8/4.  Veona Bittman, Benjaman Lobe, RN 07/20/2018, 2:36 PM

## 2018-07-20 NOTE — Progress Notes (Signed)
A consult was received from an ED physician for Vancomycin and Zosyn per pharmacy dosing (for an indication other than meningitis). The patient's profile has been reviewed for ht/wt/allergies/indication/available labs. A one time order has been placed for the above antibiotics.  Further antibiotics/pharmacy consults should be ordered by admitting physician if indicated.                       Reuel Boom, PharmD, BCPS [phone] 07/20/2018, 11:03 AM

## 2018-07-20 NOTE — ED Notes (Signed)
Levophed has been stopped.

## 2018-07-20 NOTE — ED Notes (Signed)
Bed: RESB Expected date:  Expected time:  Means of arrival:  Comments: EMS- Sepsis/BP 50s/hanging Levophed

## 2018-07-20 NOTE — Progress Notes (Signed)
Pharmacy Antibiotic Note  Morgan [name] is a 52 y.o. female with hypotension admitted on 07/20/2018 with pneumonia.  Pharmacy has been consulted for zosyn and vancomycin dosing.  Plan: Zosyn 3.375g IV q8h (4 hour infusion).  Vancomycin 1 gm IV q24h for est AUC = 543 Goal AUC = 400-500 F/u scr/cultures/levels  Height: 5\' 4"  (162.6 cm) Weight: 125 lb (56.7 kg) IBW/kg (Calculated) : 54.7  Temp (24hrs), Avg:99.3 F (37.4 C), Min:99 F (37.2 C), Max:99.6 F (37.6 C)  Recent Labs  Lab 07/20/18 1023 07/20/18 1100 07/20/18 1309 07/20/18 2258  WBC 3.0*  --   --   --   CREATININE 1.05*  --   --  0.80  LATICACIDVEN  --  3.49* 1.61 1.1    Estimated Creatinine Clearance: 72.6 mL/min (by C-G formula based on SCr of 0.8 mg/dL).    Allergies  Allergen Reactions  . Ketoconazole     REACTION: hives, swelling  . Pravachol [Pravastatin Sodium] Shortness Of Breath, Swelling and Anaphylaxis    Throat swelling  . Tape Dermatitis and Other (See Comments)  . Codeine     REACTION: increased BP  . Statins     REACTION: leg crapms    Antimicrobials this admission: 8/9 zosyn >>  8/9 vancomycin >>   Dose adjustments this admission:   Microbiology results:  BCx:   UCx:    Sputum:    MRSA PCR:  Thank you for allowing pharmacy to be a part of this patient's care.  Dorrene German 07/20/2018 11:53 PM

## 2018-07-20 NOTE — Progress Notes (Signed)
elink notified of BP 88/52. Pt is drowsy but easy to arouse, resting in bed with eyes closed. She's alert and oriented. 500cc bolus ordered and administered

## 2018-07-20 NOTE — ED Notes (Signed)
Dr, Zenia Resides notified of patient's lactic acid result of 3.49.

## 2018-07-20 NOTE — Progress Notes (Signed)
PULMONARY / CRITICAL CARE MEDICINE   Name: Morgan [name] MRN: [id number] DOB: 24-Jun-1966    ADMISSION DATE:  07/20/2018 CONSULTATION DATE: 07/20/18  CHIEF COMPLAINT: Septic shock, Pneumonia  HISTORY OF PRESENT ILLNESS:   52yoF with hx OSA, Obesity, HTN, Migraines, GERD, DM, Bipolar disorder, and recent gastric bypass followed by losing too much weight too quickly, requiring G-tube placement for nutritino, now presents with worsening of diffuse weakness and multiple recent falls including one fall today during which she struck her face (abrasion to bridge of nose, hematoma to forehead), after which she felt too weak to get up. EMS found patient to be hypotensive requiring initiation of levophed. In the ER she c/o chills and urinary frequency. Although she has the G-tube, she says she does not use it and her family reports she hasn't been eating much. In the ER she was found to have a large RLL pneumonia. In the ER she briefly required vasopressors but then was weaned off of them and admitted to the ICU. Lactate initially high but improved following 2.5L IVF bolus. Blood gas in ER showed quite severe respiratory acidosis; no ABG was rechecked. PCCM now recalled to patient bedside with worsening hypotension.   On my interview of patient, she is awake but lethargic. She reports she had her Gtube placed 06/18/18. She has only used it twice ever (once in the hospital and once the day she returned home). She has been eating by mouth but not eating much due to abdominal pain which she says she had had since her gastric bypass. She reports generalized body weakness x 2-3 weeks that is worsening. Denies SOB, Cough, CP, N/V/D, Dysuria. She reports pain in her posterior neck.   PAST MEDICAL HISTORY :  She  has a past medical history of Allergic rhinitis, Barrett's esophagus, Bipolar affective disorder ([place]), Chronic respiratory failure ([place]), Constipation, chronic, Diabetes mellitus without complication ([place]), Dry  eye, Gastroparesis, GERD (gastroesophageal reflux disease), History of bariatric surgery (03/2017), Hyperlipidemia, Hypertension, Intractable chronic migraine without aura (06/04/2015), Migraine headache, Morbid obesity ([place]), OSA (obstructive sleep apnea), Osteoarthritis, Sleep apnea, Unspecified hypothyroidism, Vitamin B 12 deficiency, and Vitamin D deficiency.  PAST SURGICAL HISTORY: She  has a past surgical history that includes Cholecystectomy (2005); Appendectomy (1978); Tonsillectomy (at age 16); Shoulder arthroscopy (7/12); Trigger finger release (12/20/2012); Gastric Roux-En-Y (N/A, 03/21/2017); Hernia repair; laparoscopy (N/A, 06/19/2018); Gastrostomy (N/A, 06/19/2018); and Hiatal hernia repair (N/A, 06/19/2018).  Allergies  Allergen Reactions  . Ketoconazole     REACTION: hives, swelling  . Pravachol [Pravastatin Sodium] Shortness Of Breath, Swelling and Anaphylaxis    Throat swelling  . Tape Dermatitis and Other (See Comments)  . Codeine     REACTION: increased BP  . Statins     REACTION: leg crapms    No current facility-administered medications on file prior to encounter.    Current Outpatient Medications on File Prior to Encounter  Medication Sig  . AIMOVIG 140 MG/ML SOAJ INJECT 140MG  (2ML) ONCE A MONTH AS DIRECTED (Patient taking differently: Inject 140 mg into the muscle every 30 (thirty) days. )  . diclofenac sodium (VOLTAREN) 1 % GEL Apply 2 g topically 2 (two) times daily as needed (JOINT PAIN). Use as directed for knee pain  . divalproex (DEPAKOTE) 500 MG DR tablet TAKE 1 TABLET EVERY MORNING AND 2 TABLETS AT BEDTIME  . ezetimibe (ZETIA) 10 MG tablet TAKE 1 TABLET ONCE DAILY (Patient taking differently: Take 10 mg by mouth at bedtime. )  . fluticasone (FLONASE) 50  MCG/ACT nasal spray SPRAY 1 SPRAY IN EACH NOSTRIL TWICE DAILY. SHAKE GENTLY BEFORE EACH [place] E. (Patient taking differently: Place 2 sprays into both nostrils 2 (two) times daily as needed for allergies. )  . gabapentin  (NEURONTIN) 300 MG capsule Take 2 capsules (600 mg total) 2 (two) times daily by mouth.  . levothyroxine (SYNTHROID, LEVOTHROID) 125 MCG tablet Take 125 mcg by mouth daily before breakfast.  . loratadine (CLARITIN) 10 MG tablet Take 10 mg by mouth daily as needed for allergies.   . Multiple Vitamin (MULTIVITAMIN) capsule Take 1 capsule by mouth daily.   Marland Kitchen nystatin (NYAMYC) powder APPLY TOPICALLY TWICE A DAY AS NEEDED (Patient taking differently: Apply 1 g topically 2 (two) times daily as needed (yeast). )  . ONE TOUCH ULTRA TEST test strip CHECK BLOOD SUGAR TWICE A DAY AS DIRECTED (Patient taking differently: 1 each by Other route 2 (two) times daily as needed. CHECK BLOOD SUGAR TWICE A DAY AS DIRECTED)  . pantoprazole (PROTONIX) 40 MG tablet Take 1 tablet by mouth daily.   Marland Kitchen PAZEO 0.7 % SOLN Place 1 drop into both eyes every morning.  . polyethylene glycol (MIRALAX / GLYCOLAX) packet Take 17 g by mouth at bedtime as needed for mild constipation.   . Probiotic Product (HEALTHY COLON PO) Take 1 capsule by mouth 2 (two) times daily.   . RESTASIS MULTIDOSE 0.05 % ophthalmic emulsion Place 1 drop into both eyes 2 (two) times daily.  . sertraline (ZOLOFT) 100 MG tablet TAKE (2) TABLETS DAILY (Patient taking differently: Take 100 mg by mouth daily. )  . tiZANidine (ZANAFLEX) 2 MG tablet Take 1 tablet (2 mg total) by mouth 3 (three) times daily. (Patient taking differently: Take 2 mg by mouth every 8 (eight) hours as needed for muscle spasms. )  . traMADol (ULTRAM) 50 MG tablet Take 50 mg by mouth every 6 (six) hours.   . ziprasidone (GEODON) 60 MG capsule TAKE 1 CAPSULE IN THE EVENING (Patient taking differently: Take 60 mg by mouth every evening. )   FAMILY HISTORY:  Her family history includes Allergies in her brother, father, mother, and sister; Arthritis in her father and mother; Asthma in her father, sister, and sister; Cancer in her sister; Colon cancer in her sister; Colon polyps in her sister;  Diabetes in her brother, father, mother, and sister; Early death (age of onset: 14) in her brother; GI problems in her mother and sister; Heart disease in her father and paternal aunt; Hyperlipidemia in her brother, father, mother, and sister; Hypertension in her brother and mother; Liver disease in her sister; Peripheral vascular disease in her father; Stroke in her sister; Stroke (age of onset: 29) in her mother. There is no history of Migraines.  SOCIAL HISTORY: She  reports that she has never smoked. She has never used smokeless tobacco. She reports that she does not drink alcohol or use drugs.  REVIEW OF SYSTEMS:   Review of Systems  Constitutional: Positive for chills and malaise/fatigue. Negative for fever.  HENT: Negative.   Eyes: Negative.   Respiratory: Negative.   Cardiovascular: Negative.   Gastrointestinal: Positive for abdominal pain and constipation. Negative for diarrhea, nausea and vomiting.  Genitourinary: Negative.   Musculoskeletal: Positive for falls.  Skin: Negative.   Neurological: Positive for weakness. Negative for dizziness, sensory change and focal weakness.  Endo/Heme/Allergies: Negative.   Psychiatric/Behavioral: Negative.    SUBJECTIVE:  Lying in ICU bed, lethargic but awake, on 2L O2 in no distress  VITAL  SIGNS: BP (!) 86/52 (BP Location: Left Arm)   Pulse 86   Temp 99 F (37.2 C) (Oral)   Resp 17   Ht 5\' 4"  (1.626 m)   Wt 56.7 kg   LMP 03/17/2017   SpO2 96%   BMI 21.46 kg/m   HEMODYNAMICS:  85/50 (MAP 59)  INTAKE / OUTPUT: I/O last 3 completed shifts: In: 2271.4 [I.V.:513.1; IV Piggyback:1758.3] Out: 260 [Urine:260]  PHYSICAL EXAMINATION: General: WD Adult female, Chronically ill appearing, in NAD Neuro: Awake/alert, moving all extremities, PERRL, Obeying commands HEENT: OP clear, MM moist  Cardiovascular: RRR no m/r/g Lungs: CTA b/l, RR 12-16 in no respiratory distress; no accessory muscle use. Speaking in full sentences  Abdomen:  Soft, mild TTP around G-tube, small amount of purulent discharge surrounding G-tube, some guarding, no rebound, normoactive bowel sounds  Musculoskeletal: Pain on palpation of cervical spinous processes Skin: abrasion to bridge of nose; hematoma to right forehead.   LABS:  BMET Recent Labs  Lab 07/20/18 1023  NA 145  K 2.9*  CL 116*  CO2 23  BUN 33*  CREATININE 1.05*  GLUCOSE 112*   Electrolytes Recent Labs  Lab 07/20/18 1023  CALCIUM 6.5*   CBC Recent Labs  Lab 07/20/18 1023  WBC 3.0*  HGB 11.3*  HCT 35.6*  PLT 122*   Coag's Recent Labs  Lab 07/20/18 1023  INR 1.17   Sepsis Markers Recent Labs  Lab 07/20/18 1100 07/20/18 1309  LATICACIDVEN 3.49* 1.61   ABG No results for input(s): PHART, PCO2ART, PO2ART in the last 168 hours.  Liver Enzymes Recent Labs  Lab 07/20/18 1023  AST 333*  ALT 165*  ALKPHOS 41  BILITOT 0.4  ALBUMIN 2.8*   Cardiac Enzymes No results for input(s): TROPONINI, PROBNP in the last 168 hours.  Glucose Recent Labs  Lab 07/20/18 1025 07/20/18 1300 07/20/18 1554 07/20/18 2232  GLUCAP 106* 71 92 63*   Imaging Dg Chest Port 1 View  Result Date: 07/20/2018 CLINICAL DATA:  Increasing weakness.  Chronic respiratory failure. EXAM: PORTABLE CHEST 1 VIEW COMPARISON:  Earlier today. FINDINGS: Normal sized heart. Dense airspace opacity at the right lung base with a more consolidated appearance and some volume loss. Clear left lung. Thoracic spine degenerative changes. IMPRESSION: Some interval volume loss associated with dense right basilar airspace opacity suspicious for pneumonia with superimposed atelectasis. See the previous report for follow-up recommendations. Electronically Signed   By: Claudie Revering M.D.   On: 07/20/2018 14:58   Dg Chest Port 1 View  Result Date: 07/20/2018 CLINICAL DATA:  Sepsis.  Weakness. EXAM: PORTABLE CHEST 1 VIEW COMPARISON:  Radiograph of March 17, 2017. FINDINGS: The heart size and mediastinal contours  are within normal limits. No pneumothorax or pleural effusion is noted. Left lung is clear. Large right lower lobe opacity is noted concerning for pneumonia. The visualized skeletal structures are unremarkable. IMPRESSION: Large right lower lobe opacity consistent with pneumonia. Followup PA and lateral chest X-ray is recommended in 3-4 weeks following trial of antibiotic therapy to ensure resolution and exclude underlying malignancy. Electronically Signed   By: Marijo Conception, M.D.   On: 07/20/2018 11:41   STUDIES:  CT Head (8/9): pending CT C-spine (8/9): pending CXR (8/9): RLL infiltrate  CULTURES: Blood culture (8/09): pending UA (8/9): consistent with UTI Urine culture (8/09): pending Pneumococcal Ag (8/09): negative Sputum culture (8/9): ordered  ANTIBIOTICS: Vancomycin 8/09 >>  Zosyn 8/09 >> Rocephin 8/09 >>8/09 Doxy 8/09 >>8/09   SIGNIFICANT EVENTS: 8/09: present  to hospital with generalized weakness, frequent falls including fall today, septic shock due to HCAP 8/09 PM: worsening hypotension and PCCM reconsulted  LINES/TUBES: PIVs (18G, 22G, 22G) >> Foley catheter (8/9)>>  DISCUSSION: 51yoF with hx OSA, Obesity, HTN, Migraines, GERD, DM, Bipolar disorder, and recent gastric bypass followed by losing too much weight too quickly, requiring G-tube placement for nutrition, now presents with worsening generalized weakness and multiple recent falls including one fall today during which she struck her face (abrasion to bridge of nose, hematoma to forehead), after which she felt too weak to get up. In the ER she was found to have a large RLL pneumonia, UTI, and Septic shock.    ASSESSMENT / PLAN:  PULMONARY 1. Acute Hypoxic and Hypercapneic Respiratory failure; HCAP pneumonia: - VBG this AM with severe respiratory acidosis; repeated ABG just now and acidosis is resolved - CXR shows RLL infiltrate consistent with pneumonia; given that patient was hospitalized just 1 month ago,  this would be an HCAP, not a CAP.  - continue 2L O2 and wean as tolerated - start IS and Flutter  CARDIOVASCULAR 1. Shock; Hx HTN: - hold home HTN meds; shock discussed below  RENAL 1. Pre-renal azotemia; Hyperchloremia; Hypokalemia: - place foley catheter as patient needs to be on strict bedrest anyway given her potential Cspine injury; foley will also allow monitoring of I/O and hourly UOP to assess volume status - avoid nephrotoxic agents - Chloride 116; change NS @ 75 to LR @ 125cc/hr - K 2.9 this AM and was repleted; recheck CMP now.   GASTROINTESTINAL 1. Abdominal pain; Transaminitis; G-tube; Unintentional weight loss; Poor appetite; hx GERD: - mild hepatocellular pattern of transaminitis may be due to shock liver from the hypotension; continue to monitor. Tbili is normal.  - abdominal pain unclear if due to Gtube infection vs SBO vs Constipation vs due to her known UTI.  - obtain KUB - add lipase to AM labs - GI prophylaxis  HEMATOLOGIC 1. Mild Leukopenia and Mild Thrombocytopenia; Anemia: - WBC 3.0 down from 5.0 previously; Plt 122 down from 210 previously; likely due to the sepsis itself; continue to monitor.   INFECTIOUS 1. Septic Shock: due to UTI and HCAP pna; questionable G-tube infection - Received 2.5L L IVF with initial improvement in BP able to wean off vasopressors, now mild hypotension again with MAP 59. Receiving additional 1 L IVF now; start low dose Levophed via her 18G PIV. If needs high amount of Levophed will require a CVL. At this time has 3 PIV's though and no definite indication for CVL now.  - lactate 3.49 initially, improved to 1.61 with IVF's; recheck now - cortisol ok at 33 - check procalcitonin - given her recent admission for G-tube placement, her pneumonia would be classified as HCAP not CAP; will change antibiotics from Ceftriaxone/Doxy to Vanc/Zosyn. Obtain sputum culture. Follow-up blood and urine cultures.   ENDOCRINE 1. DM - change to NPO  given critical illness; re-eval in AM if can eat if more stable. Start SSI q4hrs.  NEUROLOGIC 1. Generalized Weakness; Frequent falls; C-spine pain: - placed C-collar - order STAT Head and C-spine CT   FAMILY  - Updates: no family present at time of my exam - Inter-disciplinary family meet or Palliative Care meeting due by: 07/26/18  60 minutes nonprocedural critical care time  Vernie Murders, MD  Pulmonary and [place] Pager: [phone]  07/20/2018, 10:53 PM

## 2018-07-20 NOTE — H&P (Signed)
PULMONARY / CRITICAL CARE MEDICINE   Name: Morgan [name] MRN: [id number] DOB: 1965-12-13    ADMISSION DATE:  07/20/2018   REFERRING MD:  Dr. Zenia Resides, ER  CHIEF COMPLAINT:  Hypotension  HISTORY OF PRESENT ILLNESS:   52 yo female developed dizziness over past 24 hours.  She fell at home and hit her head.  Also felt shaky.  Family reports she hasn't been eating much.  Noted to have low blood pressure and given fluid.  Didn't have immediate improvement in BP and started on levophed.  After getting more IV fluid she was able to come off of levophed.  She had elevated lactic acid that improved after getting IV fluid.  She was found to have Rt lower lung lobar pneumonia and started on ABx.    She denies sinus congestion, sore throat, chest pain, nausea, vomiting, diarrhea, fever, chills, abdominal pain, skin rash, leg swelling, or sick exposures.  She feels better after getting IV fluids and ABx in ER.  STUDIES:   CULTURES: Blood 8/09 >> Urine 8/09 >> Pneumococcal Ag 8/09 >>  ANTIBIOTICS: Vancomycin 8/09 >> 8/09 Zosyn 8/09 >> 8/09 Rocephin 8/09 >> Zithromax 8/09 >>   SIGNIFICANT EVENTS: 8/09 Admit  LINES/TUBES: PIV >>  DISCUSSION: 52 yo female with sepsis from community acquired right lower lung lobar pneumonia.  She has improvement in hemodynamics and lactic acid after fluid administration.  ASSESSMENT / PLAN:  Sepsis. - continue IV fluids  Community acquired Rt lower lung lobar pneumonia. - rocephin, zithromax - f/u CXR  Acute hypoxic respiratory failure. - oxygen to keep SpO2 > 92%  Prerenal azotemia from hypovolemia. Hypokalemia. - baseline creatinine 0.53 from 06/15/18 - f/u BMET, monitor urine outpt - replace potassium  Hypothyroidism. - synthroid  Moderate protein calorie malnutrition. Hx of gastric bypass surgery. - advance diet as able  Elevated LFTs. - likely from hypotension - f/u LFTs  Hx of GERD. - protonix  Leukopenia, thrombocytopenia. -  likely from sepsis - f/u CBC  Hx of Bipolar disorder, chronic headaches. - depakote, neurontin, zoloft, tramadol, geodon, zanaflex  DVT prophylaxis - Lovenox SUP - protonix Nutrition - full liquid diet Goals of care - full code  Updated pt's family at bedside  Will ask [facility] to assume care from 37/10 and PCCM off    PAST MEDICAL HISTORY :  She  has a past medical history of Allergic rhinitis, Barrett's esophagus, Bipolar affective disorder ([place]), Chronic respiratory failure ([place]), Constipation, chronic, Diabetes mellitus without complication ([place]), Dry eye, Gastroparesis, GERD (gastroesophageal reflux disease), History of bariatric surgery (03/2017), Hyperlipidemia, Hypertension, Intractable chronic migraine without aura (06/04/2015), Migraine headache, Morbid obesity ([place]), OSA (obstructive sleep apnea), Osteoarthritis, Sleep apnea, Unspecified hypothyroidism, Vitamin B 12 deficiency, and Vitamin D deficiency.  PAST SURGICAL HISTORY: She  has a past surgical history that includes Cholecystectomy (2005); Appendectomy (1978); Tonsillectomy (at age 69); Shoulder arthroscopy (7/12); Trigger finger release (12/20/2012); Gastric Roux-En-Y (N/A, 03/21/2017); Hernia repair; laparoscopy (N/A, 06/19/2018); Gastrostomy (N/A, 06/19/2018); and Hiatal hernia repair (N/A, 06/19/2018).  Allergies  Allergen Reactions  . Ketoconazole     REACTION: hives, swelling  . Pravachol [Pravastatin Sodium] Shortness Of Breath, Swelling and Anaphylaxis    Throat swelling  . Tape Dermatitis and Other (See Comments)  . Codeine     REACTION: increased BP  . Statins     REACTION: leg crapms    Current Facility-Administered Medications on File Prior to Encounter  Medication  . cyanocobalamin ((VITAMIN B-12)) injection 1,000 mcg   Current Outpatient  Medications on File Prior to Encounter  Medication Sig  . AIMOVIG 140 MG/ML SOAJ INJECT 140MG  (2ML) ONCE A MONTH AS DIRECTED (Patient taking differently: Inject 140 mg into  the muscle every 30 (thirty) days. )  . diclofenac sodium (VOLTAREN) 1 % GEL Apply 2 g topically 2 (two) times daily as needed (JOINT PAIN). Use as directed for knee pain  . divalproex (DEPAKOTE) 500 MG DR tablet TAKE 1 TABLET EVERY MORNING AND 2 TABLETS AT BEDTIME  . ezetimibe (ZETIA) 10 MG tablet TAKE 1 TABLET ONCE DAILY (Patient taking differently: Take 10 mg by mouth at bedtime. )  . fluticasone (FLONASE) 50 MCG/ACT nasal spray SPRAY 1 SPRAY IN EACH NOSTRIL TWICE DAILY. SHAKE GENTLY BEFORE EACH [place] E. (Patient taking differently: Place 2 sprays into both nostrils 2 (two) times daily as needed for allergies. )  . gabapentin (NEURONTIN) 300 MG capsule Take 2 capsules (600 mg total) 2 (two) times daily by mouth.  . levothyroxine (SYNTHROID, LEVOTHROID) 125 MCG tablet Take 125 mcg by mouth daily before breakfast.  . loratadine (CLARITIN) 10 MG tablet Take 10 mg by mouth daily as needed for allergies.   . Multiple Vitamin (MULTIVITAMIN) capsule Take 1 capsule by mouth daily.   Marland Kitchen nystatin (NYAMYC) powder APPLY TOPICALLY TWICE A DAY AS NEEDED (Patient taking differently: Apply 1 g topically 2 (two) times daily as needed (yeast). )  . ONE TOUCH ULTRA TEST test strip CHECK BLOOD SUGAR TWICE A DAY AS DIRECTED (Patient taking differently: 1 each by Other route 2 (two) times daily as needed. CHECK BLOOD SUGAR TWICE A DAY AS DIRECTED)  . pantoprazole (PROTONIX) 40 MG tablet Take 1 tablet by mouth daily.   Marland Kitchen PAZEO 0.7 % SOLN Place 1 drop into both eyes every morning.  . polyethylene glycol (MIRALAX / GLYCOLAX) packet Take 17 g by mouth at bedtime as needed for mild constipation.   . Probiotic Product (HEALTHY COLON PO) Take 1 capsule by mouth 2 (two) times daily.   . RESTASIS MULTIDOSE 0.05 % ophthalmic emulsion Place 1 drop into both eyes 2 (two) times daily.  . sertraline (ZOLOFT) 100 MG tablet TAKE (2) TABLETS DAILY (Patient taking differently: Take 100 mg by mouth daily. )  . tiZANidine (ZANAFLEX) 2 MG  tablet Take 1 tablet (2 mg total) by mouth 3 (three) times daily. (Patient taking differently: Take 2 mg by mouth every 8 (eight) hours as needed for muscle spasms. )  . traMADol (ULTRAM) 50 MG tablet Take 50 mg by mouth every 6 (six) hours.   . ziprasidone (GEODON) 60 MG capsule TAKE 1 CAPSULE IN THE EVENING (Patient taking differently: Take 60 mg by mouth every evening. )    FAMILY HISTORY:  Her family history includes Allergies in her brother, father, mother, and sister; Arthritis in her father and mother; Asthma in her father, sister, and sister; Cancer in her sister; Colon cancer in her sister; Colon polyps in her sister; Diabetes in her brother, father, mother, and sister; Early death (age of onset: 20) in her brother; GI problems in her mother and sister; Heart disease in her father and paternal aunt; Hyperlipidemia in her brother, father, mother, and sister; Hypertension in her brother and mother; Liver disease in her sister; Peripheral vascular disease in her father; Stroke in her sister; Stroke (age of onset: 53) in her mother. There is no history of Migraines.  SOCIAL HISTORY: She  reports that she has never smoked. She has never used smokeless tobacco. She reports that  she does not drink alcohol or use drugs.  REVIEW OF SYSTEMS:   Negative except in HPI.  VITAL SIGNS: BP 110/73   Pulse 76   Temp (S) 99.6 F (37.6 C) (Rectal)   Resp 19   Ht 5\' 4"  (1.626 m)   Wt 56.7 kg   LMP 03/17/2017   SpO2 100%   BMI 21.46 kg/m   INTAKE / OUTPUT: No intake/output data recorded.  PHYSICAL EXAMINATION:  General - alert Eyes - pupils reactive ENT - no sinus tenderness, no stridor Cardiac - regular rate/rhythm, no murmur Chest - basilar crackles Rt > Lt, no wheeze Abdomen - soft, non tender, + bowel sounds, no hepatosplenomegaly Extremities - no cyanosis, clubbing, or edema Skin - no rashes Lymphatics - no lymphadenopathy Neuro - CN intact, normal strength, moves extremities, follows  commands Psych - normal mood and behavior    LABS:  BMET Recent Labs  Lab 07/20/18 1023  NA 145  K 2.9*  CL 116*  CO2 23  BUN 33*  CREATININE 1.05*  GLUCOSE 112*    Electrolytes Recent Labs  Lab 07/20/18 1023  CALCIUM 6.5*    CBC Recent Labs  Lab 07/20/18 1023  WBC 3.0*  HGB 11.3*  HCT 35.6*  PLT 122*    Coag's Recent Labs  Lab 07/20/18 1023  INR 1.17    Sepsis Markers Recent Labs  Lab 07/20/18 1100 07/20/18 1309  LATICACIDVEN 3.49* 1.61    ABG No results for input(s): PHART, PCO2ART, PO2ART in the last 168 hours.  Liver Enzymes Recent Labs  Lab 07/20/18 1023  AST 333*  ALT 165*  ALKPHOS 41  BILITOT 0.4  ALBUMIN 2.8*    Cardiac Enzymes No results for input(s): TROPONINI, PROBNP in the last 168 hours.  Glucose Recent Labs  Lab 07/20/18 1025 07/20/18 1300  GLUCAP 106* 71    Imaging CXR 07/20/18 (reviewed by me personally, independently, and by myself with my own independent, personal impression) >>  Rt lower lung lobar consolidation    Chesley Mires, MD [place] 07/20/2018, 2:27 PM

## 2018-07-20 NOTE — ED Triage Notes (Signed)
Pt arrives via [facility]. Pt comes from home. Pt arrived with Levophed drip started due to BP. Pt has been feeling weak since yesterday. Pt has G-Tube that appears to be backed up and red and tender around sight. Pt is AOx4 and ambulatory at baseline. Pt stated she has been feeling weak and has fallen several times with bruising noted around the nasal area and brow area.

## 2018-07-20 NOTE — ED Provider Notes (Signed)
[facility] DEPT Provider Note   CSN: [id number] Arrival date & time: 07/20/18  1019     History   Chief Complaint Chief Complaint  Patient presents with  . Code Sepsis  . Hypotension    HPI Morgan [name] is a 52 y.o. female.  52 year old female presents with increasing weakness as well as falls which began this morning.  Was seen by her physician yesterday and that office visit was reviewed and no acute abnormalities found.  She was to have her well checkup.  Gradually throughout the night she has had diffuse weakness and to the point that this morning when she tried to stand up her legs would not support her and she fell down and struck her face.  No LOC.  Denies any neck pain or headache at this time.  Also had diffuse body shaking.  Patient states that she just can get warm.  Has had some urinary frequency but denies any cough or shortness of breath.  No abdominal discomfort that is new for her.  Does have a history of a G-tube which is currently not being used.  EMS was called and patient found to be hypotensive and was given 1500 cc of saline.  Blood pressure quite then did not really improved and they spoke to the medical control physician who ordered patient be started on Levophed.  Blood sugar was above 100.  Transported here for further management     Past Medical History:  Diagnosis Date  . Allergic rhinitis   . Barrett's esophagus   . Bipolar affective disorder ([place])   . Chronic respiratory failure ([place])   . Constipation, chronic   . Diabetes mellitus without complication ([place])    type 2   . Dry eye   . Gastroparesis   . GERD (gastroesophageal reflux disease)   . History of bariatric surgery 03/2017  . Hyperlipidemia   . Hypertension   . Intractable chronic migraine without aura 06/04/2015  . Migraine headache   . Morbid obesity ([place])   . OSA (obstructive sleep apnea)    uses a cpap with 2 units o2   . Osteoarthritis    bilateral knee  . Sleep apnea    has c-pap  . Unspecified hypothyroidism   . Vitamin B 12 deficiency   . Vitamin D deficiency     Patient Active Problem List   Diagnosis Date Noted  . Epigastric pain 06/19/2018  . Hyperlipidemia associated with type 2 diabetes mellitus ([place]) 07/06/2017  . GERD (gastroesophageal reflux disease) 03/21/2017  . S/P gastric bypass 03/21/2017  . Intractable chronic migraine without aura 06/04/2015  . Pancreatitis 02/27/2014  . Fatty liver disease, nonalcoholic 43/15/4008  . Bipolar disorder ([place]) 02/20/2014  . Metabolic syndrome 67/61/9509  . Vitamin D deficiency   . DM (diabetes mellitus) ([place]) 03/12/2013  . HLD (hyperlipidemia) 03/12/2013  . Preoperative evaluation to rule out surgical contraindication 06/12/2011  . Hypothyroidism   . Constipation, chronic   . Vitamin B 12 deficiency   . ALLERGIC RHINITIS 12/31/2010  . BARRETTS ESOPHAGUS 12/31/2010  . Gastroparesis 12/31/2010  . Bilateral chronic knee pain 12/31/2010  . Obstructive sleep apnea 09/27/2010  . Migraine 09/27/2010  . Essential hypertension 09/27/2010  . RESPIRATORY FAILURE, CHRONIC 09/27/2010    Past Surgical History:  Procedure Laterality Date  . APPENDECTOMY  1978  . CHOLECYSTECTOMY  2005  . GASTRIC ROUX-EN-Y N/A 03/21/2017   Procedure: LAPAROSCOPIC ROUX-EN-Y GASTRIC, UPPER ENDO;  Surgeon: Greer Pickerel, MD;  Location:  [facility];  Service: General;  Laterality: N/A;  . GASTROSTOMY N/A 06/19/2018   Procedure: LAPRASCOPIC INSERTION OF GASTROSTOMY TUBE;  Surgeon: Greer Pickerel, MD;  Location: [facility];  Service: General;  Laterality: N/A;  . HERNIA REPAIR    . HIATAL HERNIA REPAIR N/A 06/19/2018   Procedure: LAPAROSCOPIC REPAIR OF HIATAL HERNIA;  Surgeon: Greer Pickerel, MD;  Location: [facility];  Service: General;  Laterality: N/A;  . LAPAROSCOPY N/A 06/19/2018   Procedure: LAPAROSCOPY DIAGNOSTIC;  Surgeon: Greer Pickerel, MD;  Location: [facility];  Service: General;  Laterality: N/A;  . SHOULDER  ARTHROSCOPY  7/12   left-dsc  . TONSILLECTOMY  at age 9  . TRIGGER FINGER RELEASE  12/20/2012   Procedure: RELEASE TRIGGER FINGER/A-1 PULLEY;  Surgeon: Tennis Must, MD;  Location: [facility];  Service: Orthopedics;  Laterality: Left;  LEFT TRIGGER THUMB RELEASE     OB History   None      Home Medications    Prior to Admission medications   Medication Sig Start Date End Date Taking? Authorizing Provider  AIMOVIG 140 MG/ML SOAJ INJECT 140MG  (2ML) ONCE A MONTH AS DIRECTED 07/16/18   Kathrynn Ducking, MD  diclofenac sodium (VOLTAREN) 1 % GEL Apply 2 g topically 2 (two) times daily as needed (JOINT PAIN). Use as directed for knee pain 10/02/15   [provider]  divalproex (DEPAKOTE) 500 MG DR tablet TAKE 1 TABLET EVERY MORNING AND 2 TABLETS AT BEDTIME 04/24/18   Ward Givens, NP  ezetimibe (ZETIA) 10 MG tablet TAKE 1 TABLET ONCE DAILY 06/05/18   Timmothy Euler, MD  fluticasone (FLONASE) 50 MCG/ACT nasal spray SPRAY 1 SPRAY IN EACH NOSTRIL TWICE DAILY. SHAKE GENTLY BEFORE EACH [place] E. Patient taking differently: SPRAY 1 SPRAY IN EACH NOSTRIL DAILY AS NEEDED . SHAKE GENTLY BEFORE EACH [place] E. 08/01/17   Timmothy Euler, MD  gabapentin (NEURONTIN) 300 MG capsule Take 2 capsules (600 mg total) 2 (two) times daily by mouth. 10/18/17   Ward Givens, NP  levothyroxine (SYNTHROID, LEVOTHROID) 125 MCG tablet Take 125 mcg by mouth daily before breakfast.    [provider]  loratadine (CLARITIN) 10 MG tablet Take 10 mg by mouth daily as needed for allergies. Prn      [provider]  Multiple Vitamin (MULTIVITAMIN) capsule Take 1 capsule by mouth daily.     [provider]  nystatin [facility]) powder APPLY TOPICALLY TWICE A DAY AS NEEDED 07/06/17   Timmothy Euler, MD  ONE TOUCH ULTRA TEST test strip CHECK BLOOD SUGAR TWICE A DAY AS DIRECTED 04/02/18   Timmothy Euler, MD  OXYGEN Inhale 2 L into the lungs Nightly.    [provider]    pantoprazole (PROTONIX) 40 MG tablet Take 1 tablet by mouth daily.  12/16/17   [provider]  PAZEO 0.7 % SOLN Place 1 drop into both eyes every morning. 06/10/16   [provider]  polyethylene glycol (MIRALAX / GLYCOLAX) packet Take 17 g by mouth daily as needed for mild constipation.     [provider]  Probiotic Product (HEALTHY COLON PO) Take 1 capsule by mouth daily.     [provider]  RESTASIS MULTIDOSE 0.05 % ophthalmic emulsion Place 1 drop into both eyes 2 (two) times daily. 06/17/16   [provider]  sertraline (ZOLOFT) 100 MG tablet TAKE (2) TABLETS DAILY 07/10/18   Ronnie Doss M, DO  tiZANidine (ZANAFLEX) 2 MG tablet Take 1 tablet (  2 mg total) by mouth 3 (three) times daily. Patient taking differently: Take 2 mg by mouth every 8 (eight) hours as needed for muscle spasms.  11/27/17   Kathrynn Ducking, MD  traMADol (ULTRAM) 50 MG tablet Take 50 mg by mouth every 6 (six) hours.     [provider]  ziprasidone (GEODON) 60 MG capsule TAKE 1 CAPSULE IN THE EVENING 03/12/18   Timmothy Euler, MD    Family History Family History  Problem Relation Age of Onset  . Asthma Father   . Allergies Father   . Heart disease Father        enlarged heart  . Peripheral vascular disease Father   . Diabetes Father   . Hyperlipidemia Father   . Arthritis Father   . Asthma Sister   . Cancer Sister        colon at 80 yr old.  . Colon cancer Sister   . Allergies Mother   . Stroke Mother 10       with hemi paralysis  . Diabetes Mother   . Hyperlipidemia Mother   . Hypertension Mother   . GI problems Mother   . Arthritis Mother   . Allergies Brother   . Early death Brother 9       congenital abormality  . Allergies Sister   . Diabetes Sister   . Asthma Sister   . Colon polyps Sister   . Hyperlipidemia Sister   . GI problems Sister        gastroporesis   . Liver disease Sister        fatty liver  . Stroke Sister         intercrandial bleed  . Diabetes Brother   . Hypertension Brother   . Hyperlipidemia Brother   . Heart disease Paternal Aunt   . Migraines Neg Hx     Social History Social History   Tobacco Use  . Smoking status: Never Smoker  . Smokeless tobacco: Never Used  Substance Use Topics  . Alcohol use: No  . Drug use: No     Allergies   Ketoconazole; Pravachol [pravastatin sodium]; Tape; Codeine; and Statins   Review of Systems Review of Systems  All other systems reviewed and are negative.    Physical Exam Updated Vital Signs Ht 1.626 m (5\' 4" )   Wt 56.7 kg   SpO2 99%   BMI 21.46 kg/m   Physical Exam  Constitutional: She is oriented to person, place, and time. She appears well-developed and well-nourished.  Non-toxic appearance. No distress.  HENT:  Head: Normocephalic and atraumatic.  Eyes: Pupils are equal, round, and reactive to light. Conjunctivae, EOM and lids are normal.  Neck: Normal range of motion. Neck supple. No tracheal deviation present. No thyroid mass present.  Cardiovascular: Normal rate, regular rhythm and normal heart sounds. Exam reveals no gallop.  No murmur heard. Pulmonary/Chest: Effort normal and breath sounds normal. No stridor. No respiratory distress. She has no decreased breath sounds. She has no wheezes. She has no rhonchi. She has no rales.  Abdominal: Soft. Normal appearance and bowel sounds are normal. She exhibits no distension. There is no tenderness. There is no rigidity, no rebound, no guarding and no CVA tenderness.    Musculoskeletal: Normal range of motion. She exhibits no edema or tenderness.  Neurological: She is alert and oriented to person, place, and time. She has normal strength. No cranial nerve deficit or sensory deficit. GCS eye subscore is 4. GCS  verbal subscore is 5. GCS motor subscore is 6.  Skin: Skin is warm and dry. No abrasion and no rash noted.  Psychiatric: Her affect is blunt.  Nursing note and vitals  reviewed.    ED Treatments / Results  Labs (all labs ordered are listed, but only abnormal results are displayed) Labs Reviewed  CBG MONITORING, ED - Abnormal; Notable for the following components:      Result Value   Glucose-Capillary 106 (*)    All other components within normal limits  CULTURE, BLOOD (ROUTINE X 2)  CULTURE, BLOOD (ROUTINE X 2)  URINE CULTURE  COMPREHENSIVE METABOLIC PANEL  CBC WITH DIFFERENTIAL/PLATELET  PROTIME-INR  URINALYSIS, ROUTINE W REFLEX MICROSCOPIC  TSH  VALPROIC ACID LEVEL  BLOOD GAS, VENOUS  I-STAT CG4 LACTIC ACID, ED  I-STAT BETA HCG BLOOD, ED ([facility], [facility], [facility] ONLY)  I-STAT TROPONIN, ED  I-STAT CG4 LACTIC ACID, ED    EKG EKG Interpretation  Date/Time:  Friday July 20 2018 10:23:49 EDT Ventricular Rate:  75 PR Interval:    QRS Duration: 106 QT Interval:  514 QTC Calculation: 575 R Axis:   54 Text Interpretation:  Sinus rhythm Low voltage, precordial leads Borderline repolarization abnormality Prolonged QT interval Baseline wander in lead(s) V3 Confirmed by Lacretia Leigh ([id number]) on 07/20/2018 10:47:12 AM   Radiology No results found.  Procedures Procedures (including critical care time)  Medications Ordered in ED Medications  0.9 %  sodium chloride infusion (has no administration in time range)  piperacillin-tazobactam (ZOSYN) IVPB 3.375 g (has no administration in time range)  vancomycin (VANCOCIN) IVPB 1000 mg/200 mL premix (has no administration in time range)     Initial Impression / Assessment and Plan / ED Course  I have reviewed the triage vital signs and the nursing notes.  Pertinent labs & imaging results that were available during my care of the patient were reviewed by me and considered in my medical decision making (see chart for details).     Patient received 1500 cc of saline prior to arrival here.  She was given an additional 500 cc of fluid as well as started at 125 cc/h.  This will complete her 30 cc/kg bolus.   Lactate was also noted as well.  She had been on Levophed and that was attempted to be weaned off but she did not tolerate it and became hypotensive.  Levophed was restarted again.  She received IV antibiotics as well for her pneumonia that she has an chest x-ray.  Spoke with Dr.sood from critical care who will come and admit the patient  CRITICAL CARE Performed by: Leota Jacobsen Total critical care time: 70 minutes Critical care time was exclusive of separately billable procedures and treating other patients. Critical care was necessary to treat or prevent imminent or life-threatening deterioration. Critical care was time spent personally by me on the following activities: development of treatment plan with patient and/or surrogate as well as nursing, discussions with consultants, evaluation of patient's response to treatment, examination of patient, obtaining history from patient or surrogate, ordering and performing treatments and interventions, ordering and review of laboratory studies, ordering and review of radiographic studies, pulse oximetry and re-evaluation of patient's condition.   Final Clinical Impressions(s) / ED Diagnoses   Final diagnoses:  None    ED Discharge Orders    None       Lacretia Leigh, MD 07/20/18 1308

## 2018-07-20 NOTE — ED Notes (Signed)
Pt was brought in via [facility]. Pt has Levophed running when arrived to ED and still currently with approval of ED Provider and will continue to run until ED Provider says otherwise or BP Increases over limit.   Levophed Current Infusion Rate - 62mcg / minute

## 2018-07-20 NOTE — ED Notes (Signed)
Pt Levophed was changed to 1.27mcg / minute and pt BP began to drop. Pt was put back to 55mcg / minute and BP began to come back up to Normal Limits. ED Provider made aware  Levophed  50mcg / minute

## 2018-07-20 NOTE — Progress Notes (Signed)
Blood sugar 63 on accucheck. 25 mL of D50 administered through IV. Will continue to monitor at this time.

## 2018-07-20 NOTE — ED Notes (Signed)
Spoke with MD Halford Chessman, RN will discontinue Levophed and give a bolus of 0.9% Normal saline. RN will keep Levophed at bedside for emergency.

## 2018-07-21 LAB — CBC
HCT: 33.4 % — ABNORMAL LOW (ref 36.0–46.0)
Hemoglobin: 10.8 g/dL — ABNORMAL LOW (ref 12.0–15.0)
MCH: 30.2 pg (ref 26.0–34.0)
MCHC: 32.3 g/dL (ref 30.0–36.0)
MCV: 93.3 fL (ref 78.0–100.0)
PLATELETS: 170 10*3/uL (ref 150–400)
RBC: 3.58 MIL/uL — AB (ref 3.87–5.11)
RDW: 13.5 % (ref 11.5–15.5)
WBC: 13.9 10*3/uL — AB (ref 4.0–10.5)

## 2018-07-21 LAB — COMPREHENSIVE METABOLIC PANEL
ALT: 142 U/L — AB (ref 0–44)
ANION GAP: 7 (ref 5–15)
AST: 169 U/L — ABNORMAL HIGH (ref 15–41)
Albumin: 2.4 g/dL — ABNORMAL LOW (ref 3.5–5.0)
Alkaline Phosphatase: 38 U/L (ref 38–126)
BUN: 21 mg/dL — ABNORMAL HIGH (ref 6–20)
CHLORIDE: 113 mmol/L — AB (ref 98–111)
CO2: 24 mmol/L (ref 22–32)
CREATININE: 0.68 mg/dL (ref 0.44–1.00)
Calcium: 7.1 mg/dL — ABNORMAL LOW (ref 8.9–10.3)
GFR calc non Af Amer: >60 mL/min (ref >60)
Glucose, Bld: 90 mg/dL (ref 70–99)
Potassium: 4.5 mmol/L (ref 3.5–5.1)
SODIUM: 144 mmol/L (ref 135–145)
Total Bilirubin: 0.3 mg/dL (ref 0.3–1.2)
Total Protein: 4.9 g/dL — ABNORMAL LOW (ref 6.5–8.1)

## 2018-07-21 LAB — LACTIC ACID, PLASMA
Lactic Acid, Venous: 2.3 mmol/L (ref 0.5–1.9)
Lactic Acid, Venous: 0.9 mmol/L (ref 0.5–1.9)

## 2018-07-21 LAB — URINE CULTURE: Culture: NO GROWTH

## 2018-07-21 LAB — GLUCOSE, CAPILLARY
GLUCOSE-CAPILLARY: 70 mg/dL (ref 70–99)
GLUCOSE-CAPILLARY: 77 mg/dL (ref 70–99)
GLUCOSE-CAPILLARY: 77 mg/dL (ref 70–99)
GLUCOSE-CAPILLARY: 85 mg/dL (ref 70–99)
GLUCOSE-CAPILLARY: 88 mg/dL (ref 70–99)
Glucose-Capillary: 66 mg/dL — ABNORMAL LOW (ref 70–99)
Glucose-Capillary: 90 mg/dL (ref 70–99)
Glucose-Capillary: 96 mg/dL (ref 70–99)

## 2018-07-21 LAB — PHOSPHORUS: PHOSPHORUS: 2.8 mg/dL (ref 2.5–4.6)

## 2018-07-21 LAB — PROCALCITONIN: PROCALCITONIN: 29.88 ng/mL

## 2018-07-21 LAB — MAGNESIUM: Magnesium: 2.4 mg/dL (ref 1.7–2.4)

## 2018-07-21 LAB — LIPASE, BLOOD: LIPASE: 24 U/L (ref 11–51)

## 2018-07-21 MED ORDER — DIVALPROEX SODIUM 250 MG PO DR TAB
500.0000 mg | DELAYED_RELEASE_TABLET | Freq: Two times a day (BID) | ORAL | Status: DC
  Administered 2018-07-21 – 2018-07-24 (×7): 500 mg via ORAL
  Filled 2018-07-21 (×7): qty 2

## 2018-07-21 MED ORDER — SODIUM CHLORIDE 0.9 % IV BOLUS
1000.0000 mL | Freq: Once | INTRAVENOUS | Status: AC
  Administered 2018-07-21: 1000 mL via INTRAVENOUS

## 2018-07-21 MED ORDER — GABAPENTIN 300 MG PO CAPS
300.0000 mg | ORAL_CAPSULE | Freq: Two times a day (BID) | ORAL | Status: DC
  Administered 2018-07-21 – 2018-07-24 (×7): 300 mg via ORAL
  Filled 2018-07-21 (×7): qty 1

## 2018-07-21 MED ORDER — TRAMADOL HCL 50 MG PO TABS
50.0000 mg | ORAL_TABLET | Freq: Four times a day (QID) | ORAL | Status: DC | PRN
  Administered 2018-07-21 – 2018-07-23 (×5): 50 mg via ORAL
  Filled 2018-07-21 (×6): qty 1

## 2018-07-21 MED ORDER — SERTRALINE HCL 100 MG PO TABS
100.0000 mg | ORAL_TABLET | Freq: Every day | ORAL | Status: DC
  Administered 2018-07-21 – 2018-07-24 (×4): 100 mg via ORAL
  Filled 2018-07-21 (×4): qty 1

## 2018-07-21 MED ORDER — SIMETHICONE 80 MG PO CHEW
80.0000 mg | CHEWABLE_TABLET | Freq: Four times a day (QID) | ORAL | Status: DC | PRN
  Administered 2018-07-21 – 2018-07-23 (×6): 80 mg via ORAL
  Filled 2018-07-21 (×6): qty 1

## 2018-07-21 MED ORDER — POTASSIUM CHLORIDE 10 MEQ/100ML IV SOLN
10.0000 meq | INTRAVENOUS | Status: AC
Start: 2018-07-21 — End: 2018-07-21
  Administered 2018-07-21 (×4): 10 meq via INTRAVENOUS
  Filled 2018-07-21 (×4): qty 100

## 2018-07-21 MED ORDER — MAGNESIUM SULFATE 2 GM/50ML IV SOLN
2.0000 g | Freq: Once | INTRAVENOUS | Status: AC
  Administered 2018-07-21: 2 g via INTRAVENOUS
  Filled 2018-07-21: qty 50

## 2018-07-21 MED ORDER — DEXTROSE 50 % IV SOLN
INTRAVENOUS | Status: AC
  Administered 2018-07-21: 25 mL
  Filled 2018-07-21: qty 50

## 2018-07-21 MED ORDER — ASPIRIN-ACETAMINOPHEN-CAFFEINE 250-250-65 MG PO TABS
2.0000 | ORAL_TABLET | Freq: Four times a day (QID) | ORAL | Status: DC | PRN
Start: 2018-07-21 — End: 2018-07-24
  Administered 2018-07-21 – 2018-07-24 (×4): 2 via ORAL
  Filled 2018-07-21 (×5): qty 2

## 2018-07-21 MED ORDER — FAMOTIDINE 20 MG PO TABS
20.0000 mg | ORAL_TABLET | Freq: Two times a day (BID) | ORAL | Status: DC
  Administered 2018-07-21 – 2018-07-22 (×2): 20 mg via ORAL
  Filled 2018-07-21 (×2): qty 1

## 2018-07-21 MED ORDER — LEVOTHYROXINE SODIUM 25 MCG PO TABS
125.0000 ug | ORAL_TABLET | Freq: Every day | ORAL | Status: DC
  Administered 2018-07-22 – 2018-07-24 (×3): 125 ug via ORAL
  Filled 2018-07-21 (×3): qty 1

## 2018-07-21 NOTE — Progress Notes (Addendum)
CRITICAL VALUE ALERT  Critical Value:  Lactic acid 2.3  Date & Time Notied:  07/21/18 0238  Provider Notified: Dr. Jimmy Footman  Orders Received/Actions taken: 1 L bolus NS

## 2018-07-21 NOTE — Progress Notes (Signed)
Hypoglycemic Event  CBG: 66  Treatment: 1/2 amp D50  Symptoms: None  Follow-up CBG: Time: 0826 CBG Result:90  Possible Reasons for Event: Pt NPO Comments/MD notified: MD notified    Wray Kearns

## 2018-07-21 NOTE — Progress Notes (Signed)
SBP Goal >85 or a MAP Goal >60 per Elsworth Soho, MD.

## 2018-07-21 NOTE — Progress Notes (Signed)
PULMONARY / CRITICAL CARE MEDICINE   Name: Morgan [name] MRN: [id number] DOB: 15-Jul-1966    ADMISSION DATE:  07/20/2018   REFERRING MD:  Dr. Zenia Resides, ER  CHIEF COMPLAINT:  Hypotension  HISTORY OF PRESENT ILLNESS:   52 year old woman who lost 175 pounds after gastric bypass, admitted with hypotension after a fall at home  STUDIES:   CULTURES: Blood 8/09 >>ng Urine 8/09 >> Pneumococcal Ag 8/09 >>neg  ANTIBIOTICS: Vancomycin 8/09 >>  Zosyn 8/09 >>  Rocephin 8/09 >> 8/9 Zithromax 8/09 >> 8/9  SIGNIFICANT EVENTS: 8/09 Admit  LINES/TUBES: PIV >>  SUBJ  -remain hypotensive overnight requiring restarting Levophed but now off again. Received fluid bolus overnight. Denies dizziness or shortness of breath or chest pain  VITAL SIGNS: BP 112/72   Pulse 71   Temp 97.6 F (36.4 C) (Oral)   Resp 16   Ht 5\' 4"  (1.626 m)   Wt 56.7 kg   LMP 03/17/2017   SpO2 97%   BMI 21.46 kg/m   INTAKE / OUTPUT: I/O last 3 completed shifts: In: 5868 [I.V.:1616; IV Piggyback:4252.1] Out: 1610 [Urine:1610]  PHYSICAL EXAMINATION:  Gen. Pleasant, obese, in no distress, normal affect ENT - no pallor, icterus, no post nasal drip, class 2 airway Neck: No JVD, no thyromegaly, no carotid bruits Lungs: no use of accessory muscles, no dullness to percussion, decreased without rales or rhonchi  Cardiovascular: Rhythm regular, heart sounds  normal, no murmurs or gallops, no peripheral edema Abdomen: soft and non-tender, no hepatosplenomegaly, BS normal. Musculoskeletal: No deformities, no cyanosis or clubbing Neuro:  alert, non focal, no tremors     LABS:  BMET Recent Labs  Lab 07/20/18 1023 07/20/18 2258 07/21/18 0429  NA 145 145 144  K 2.9* 3.6 4.5  CL 116* 112* 113*  CO2 23 24 24   BUN 33* 28* 21*  CREATININE 1.05* 0.80 0.68  GLUCOSE 112* 98 90    Electrolytes Recent Labs  Lab 07/20/18 1023 07/20/18 2258 07/21/18 0429  CALCIUM 6.5* 7.2* 7.1*  MG  --  1.8 2.4  PHOS  --   3.2 2.8    CBC Recent Labs  Lab 07/20/18 1023 07/21/18 0429  WBC 3.0* 13.9*  HGB 11.3* 10.8*  HCT 35.6* 33.4*  PLT 122* 170    Coag's Recent Labs  Lab 07/20/18 1023  INR 1.17    Sepsis Markers Recent Labs  Lab 07/20/18 2258 07/21/18 0139 07/21/18 0429  LATICACIDVEN 1.1 2.3* 0.9  PROCALCITON 37.17  --  29.88    ABG Recent Labs  Lab 07/20/18 2343  PHART 7.388  PCO2ART 42.1  PO2ART 74.2*    Liver Enzymes Recent Labs  Lab 07/20/18 1023 07/20/18 2258 07/21/18 0429  AST 333* 214* 169*  ALT 165* 158* 142*  ALKPHOS 41 37* 38  BILITOT 0.4 0.4 0.3  ALBUMIN 2.8* 2.4* 2.4*    Cardiac Enzymes No results for input(s): TROPONINI, PROBNP in the last 168 hours.  Glucose Recent Labs  Lab 07/20/18 1554 07/20/18 2232 07/20/18 2251 07/21/18 0403 07/21/18 0755 07/21/18 0826  GLUCAP 92 63* 104* 77 66* 90    Imaging  Chest x-ray from 8/9 personally reviewed shows right lower lobe consolidation    DISCUSSION: 52 yo female with sepsis from RLL HCAP. Lactate resolved but persistent hypotension  ASSESSMENT / PLAN:  Sepsis. - continue IV fluids -Lactate has resolved and procalcitonin decreasing   Rt lower lobar HCAP -Antibiotics broadened to Zosyn, discontinue vancomycin since MRSA PCR negative  Acute hypoxic respiratory failure. -  oxygen to keep SpO2 > 92%  Prerenal azotemia from hypovolemia-resolved Hypokalemia. -Repleted  Hypothyroidism. - synthroid  Falls -head CT and C-spine negative  Moderate protein calorie malnutrition. Hx of gastric bypass surgery. - advance diet as able  Elevated LFTs. - likely from hypotension - f/u LFTs  Hx of GERD. - protonix  Leukopenia, thrombocytopenia. - likely from sepsis - f/u CBC  Hx of Bipolar disorder, chronic headaches. -Resume Depakote, neurontin, zoloft, tramadol, geodon, zanaflex  DVT prophylaxis - Lovenox  Will ask [facility] to assume care, change to stepdown status  Kara Mead MD.  Shade Flood. [facility] Pager [phone] If no response call [phone]     07/21/2018, 11:22 AM

## 2018-07-21 NOTE — Progress Notes (Signed)
Pt noted to not have urine output with foley since 3am despite 1L bolus of NS. Assessed cathter, found bed wet and catheter kinked. Catheter readjusted, 750 cc of urine returned.

## 2018-07-21 NOTE — Progress Notes (Signed)
eLink Physician-Brief Progress Note Patient Name: Morgan [name] DOB: 16-May-1966 MRN: [id number]   Date of Service  07/21/2018  HPI/Events of Note  Hypokalemia Hypomag  eICU Interventions  Potassium and Mag replaced     Intervention Category Intermediate Interventions: Electrolyte abnormality - evaluation and management  DETERDING,ELIZABETH 07/21/2018, 12:23 AM

## 2018-07-21 NOTE — Progress Notes (Signed)
PHARMACIST - PHYSICIAN COMMUNICATION  CONCERNING: IV to Oral Route Change Policy  RECOMMENDATION: This patient is receiving synthroid and pepcid by the intravenous route.  Based on criteria approved by the Pharmacy and Therapeutics Committee, the intravenous medication(s) is/are being converted to the equivalent oral dose form(s).   DESCRIPTION: These criteria include:  The patient is eating (either orally or via tube) and/or has been taking other orally administered medications for a least 24 hours  The patient has no evidence of active gastrointestinal bleeding or impaired GI absorption (gastrectomy, short bowel, patient on TNA or NPO).  If you have questions about this conversion, please contact the Pharmacy Department  []   [phone] )  [facility] []   [phone] )  [facility] []   [phone] )  [facility] []   [phone] )  [facility] [x]   [phone] )  [place], [place].D [phone] 07/21/2018 1:39 PM

## 2018-07-22 ENCOUNTER — Inpatient Hospital Stay (HOSPITAL_COMMUNITY): Payer: Medicare Other

## 2018-07-22 DIAGNOSIS — D696 Thrombocytopenia, unspecified: Secondary | ICD-10-CM

## 2018-07-22 DIAGNOSIS — Y92009 Unspecified place in unspecified non-institutional (private) residence as the place of occurrence of the external cause: Secondary | ICD-10-CM

## 2018-07-22 DIAGNOSIS — W19XXXA Unspecified fall, initial encounter: Secondary | ICD-10-CM

## 2018-07-22 DIAGNOSIS — R945 Abnormal results of liver function studies: Secondary | ICD-10-CM

## 2018-07-22 LAB — CBC
HEMATOCRIT: 28.9 % — AB (ref 36.0–46.0)
Hemoglobin: 9.4 g/dL — ABNORMAL LOW (ref 12.0–15.0)
MCH: 30.6 pg (ref 26.0–34.0)
MCHC: 32.5 g/dL (ref 30.0–36.0)
MCV: 94.1 fL (ref 78.0–100.0)
PLATELETS: 109 10*3/uL — AB (ref 150–400)
RBC: 3.07 MIL/uL — AB (ref 3.87–5.11)
RDW: 13.8 % (ref 11.5–15.5)
WBC: 7.1 10*3/uL (ref 4.0–10.5)

## 2018-07-22 LAB — EXPECTORATED SPUTUM ASSESSMENT W GRAM STAIN, RFLX TO RESP C

## 2018-07-22 LAB — BASIC METABOLIC PANEL
Anion gap: 4 — ABNORMAL LOW (ref 5–15)
BUN: 16 mg/dL (ref 6–20)
CO2: 27 mmol/L (ref 22–32)
Calcium: 7.6 mg/dL — ABNORMAL LOW (ref 8.9–10.3)
Chloride: 112 mmol/L — ABNORMAL HIGH (ref 98–111)
Creatinine, Ser: 0.49 mg/dL (ref 0.44–1.00)
GFR calc Af Amer: >60 mL/min (ref >60)
Glucose, Bld: 120 mg/dL — ABNORMAL HIGH (ref 70–99)
POTASSIUM: 3.5 mmol/L (ref 3.5–5.1)
Sodium: 143 mmol/L (ref 135–145)

## 2018-07-22 LAB — GLUCOSE, CAPILLARY
GLUCOSE-CAPILLARY: 75 mg/dL (ref 70–99)
GLUCOSE-CAPILLARY: 79 mg/dL (ref 70–99)
GLUCOSE-CAPILLARY: 90 mg/dL (ref 70–99)
GLUCOSE-CAPILLARY: 92 mg/dL (ref 70–99)
Glucose-Capillary: 65 mg/dL — ABNORMAL LOW (ref 70–99)
Glucose-Capillary: 107 mg/dL — ABNORMAL HIGH (ref 70–99)
Glucose-Capillary: 75 mg/dL (ref 70–99)
Glucose-Capillary: 69 mg/dL — ABNORMAL LOW (ref 70–99)
Glucose-Capillary: 71 mg/dL (ref 70–99)
Glucose-Capillary: 75 mg/dL (ref 70–99)
Glucose-Capillary: 65 mg/dL — ABNORMAL LOW (ref 70–99)

## 2018-07-22 LAB — PROCALCITONIN: Procalcitonin: 16.44 ng/mL

## 2018-07-22 LAB — EXPECTORATED SPUTUM ASSESSMENT W REFEX TO RESP CULTURE

## 2018-07-22 LAB — MAGNESIUM: Magnesium: 2.1 mg/dL (ref 1.7–2.4)

## 2018-07-22 LAB — PHOSPHORUS: Phosphorus: 2 mg/dL — ABNORMAL LOW (ref 2.5–4.6)

## 2018-07-22 MED ORDER — SODIUM CHLORIDE 0.9 % IV BOLUS
1000.0000 mL | Freq: Once | INTRAVENOUS | Status: AC
  Administered 2018-07-22: 1000 mL via INTRAVENOUS

## 2018-07-22 MED ORDER — SCOPOLAMINE 1 MG/3DAYS TD PT72
1.0000 | MEDICATED_PATCH | TRANSDERMAL | Status: DC
  Administered 2018-07-22: 1.5 mg via TRANSDERMAL
  Filled 2018-07-22: qty 1

## 2018-07-22 MED ORDER — PANTOPRAZOLE SODIUM 40 MG PO TBEC
40.0000 mg | DELAYED_RELEASE_TABLET | Freq: Every day | ORAL | Status: DC
  Administered 2018-07-22 – 2018-07-24 (×3): 40 mg via ORAL
  Filled 2018-07-22 (×3): qty 1

## 2018-07-22 MED ORDER — CEFDINIR 300 MG PO CAPS
300.0000 mg | ORAL_CAPSULE | Freq: Two times a day (BID) | ORAL | Status: DC
  Administered 2018-07-22 – 2018-07-24 (×4): 300 mg via ORAL
  Filled 2018-07-22 (×4): qty 1

## 2018-07-22 MED ORDER — ZIPRASIDONE HCL 60 MG PO CAPS
60.0000 mg | ORAL_CAPSULE | Freq: Every evening | ORAL | Status: DC
  Administered 2018-07-22: 60 mg via ORAL
  Filled 2018-07-22 (×2): qty 1

## 2018-07-22 MED ORDER — POTASSIUM CHLORIDE CRYS ER 20 MEQ PO TBCR
40.0000 meq | EXTENDED_RELEASE_TABLET | Freq: Once | ORAL | Status: AC
  Administered 2018-07-22: 40 meq via ORAL
  Filled 2018-07-22: qty 2

## 2018-07-22 MED ORDER — DEXTROSE 50 % IV SOLN
INTRAVENOUS | Status: AC
  Administered 2018-07-22: 25 mL via INTRAVENOUS
  Filled 2018-07-22: qty 50

## 2018-07-22 NOTE — Progress Notes (Signed)
PROGRESS NOTE    Morgan [name]  ZRA:[id number] DOB: 04-14-1966 DOA: 07/20/2018 PCP: Janora Norlander, DO   Brief Narrative:  HPI on 07/20/2018 by Dr. Chesley Mires 52 yo female developed dizziness over past 24 hours.  She fell at home and hit her head.  Also felt shaky.  Family reports she hasn't been eating much.  Noted to have low blood pressure and given fluid.  Didn't have immediate improvement in BP and started on levophed.  After getting more IV fluid she was able to come off of levophed.  She had elevated lactic acid that improved after getting IV fluid.  She was found to have Rt lower lung lobar pneumonia and started on ABx.   She denies sinus congestion, sore throat, chest pain, nausea, vomiting, diarrhea, fever, chills, abdominal pain, skin rash, leg swelling, or sick exposures. She feels better after getting IV fluids and ABx in ER.  Interim history Admitted for hypotension and sepsis secondary to pneumonia. Admitted to PCCM, and placed on pressors.  BP holding stable off of pressors, patient was then transferred to [facility] service on 07/22/2018.  Assessment & Plan   Sepsis/shock secondary to right lower lobe pneumonia -Presented with hypotension, leukocytosis, -Patient did require levo fed for hypotension.  Has been discontinued and BP has remained stable. -Chest x-ray reviewed showing a right lower lobe infiltrate; of note patient was hospitalized approximately 1 month ago -Initially placed on ceftriaxone and doxy however transition to vancomycin and Zosyn, vancomycin was discontinued as PCR for MRSA was negative -Calcitonin improving -Blood cultures show no growth to date -Strep pneumonia urine antigen negative -UA showed 21-50 WBC, negative leukocytes and nitrites; shows no growth  Acute hypoxic respiratory failure -VBG on admission showed severe respiratory acidosis, repeat ABG showed acidosis had resolved -Likely secondary to pneumonia, continue supplemental oxygen to  maintain saturations above 92% -Continue incentive spirometry and flutter valve  Prerenal azotemia/AKI secondary to hypovolemia -Resolved, continue to monitor BMP  Fall -Secondary to hypotension -CT head and C-spine unremarkable -PT and OT consulted  Hypokalemia  -Potassium currently 3.5, will supplement for goal of 4 -Continue to monitor BMP  Hypothyroidism -Continue Synthroid -TSH 0.487  Elevated LFTs -Likely secondary to hypotension, will continue to follow  GERD -Continue PPI  Leukopenia, thrombocytopenia -Likely secondary to sepsis -Continue to monitor CBC  History of bipolar disorder, chronic headaches -Continue Depakote, Neurontin, Zoloft, tramadol, Geodon, Zanaflex  DVT Prophylaxis  lovenox  Code Status: Full  Family Communication: None at bedside  Disposition Plan: admitted, pending PT/OT  Consultants PCCM  Procedures  None  Antibiotics   Anti-infectives (From admission, onward)   Start     Dose/Rate Route Frequency Ordered Stop   07/21/18 0800  vancomycin (VANCOCIN) IVPB 1000 mg/200 mL premix  Status:  Discontinued     1,000 mg 200 mL/hr over 60 Minutes Intravenous Every 24 hours 07/20/18 2358 07/21/18 1129   07/20/18 2359  piperacillin-tazobactam (ZOSYN) IVPB 3.375 g     3.375 g 12.5 mL/hr over 240 Minutes Intravenous Every 8 hours 07/20/18 2323     07/20/18 1600  cefTRIAXone (ROCEPHIN) 1 g in sodium chloride 0.9 % 100 mL IVPB  Status:  Discontinued     1 g 200 mL/hr over 30 Minutes Intravenous Every 24 hours 07/20/18 1418 07/20/18 2316   07/20/18 1600  azithromycin (ZITHROMAX) 500 mg in sodium chloride 0.9 % 250 mL IVPB  Status:  Discontinued     500 mg 250 mL/hr over 60 Minutes Intravenous Every 24  hours 07/20/18 1418 07/20/18 1513   07/20/18 1600  doxycycline (VIBRAMYCIN) 100 mg in sodium chloride 0.9 % 250 mL IVPB  Status:  Discontinued     100 mg 125 mL/hr over 120 Minutes Intravenous Every 12 hours 07/20/18 1513 07/20/18 2316   07/20/18  1045  piperacillin-tazobactam (ZOSYN) IVPB 3.375 g     3.375 g 100 mL/hr over 30 Minutes Intravenous  Once 07/20/18 1044 07/20/18 1140   07/20/18 1045  vancomycin (VANCOCIN) IVPB 1000 mg/200 mL premix     1,000 mg 200 mL/hr over 60 Minutes Intravenous  Once 07/20/18 1044 07/20/18 1222      Subjective:   Morgan [name] seen and examined today.  Feeling better today.  States that her blood pressure has been low since her gastric bypass surgery and her weight loss.  Denies current chest pain, shortness breath, abdominal pain, nausea or vomiting, diarrhea constipation, dizziness or headache.  Objective:   Vitals:   07/22/18 0815 07/22/18 0830 07/22/18 0845 07/22/18 0900  BP: (!) 86/57 (!) 94/58 (!) 99/58 (!) 95/56  Pulse: 63 67 67 67  Resp: 18 19 19 16   Temp:      TempSrc:      SpO2: 95% 92% 95% 94%  Weight:      Height:        Intake/Output Summary (Last 24 hours) at 07/22/2018 1139 Last data filed at 07/22/2018 0909 Gross per 24 hour  Intake 3693.72 ml  Output 815 ml  Net 2878.72 ml   Filed Weights   07/20/18 1036  Weight: 56.7 kg    Exam  General: Well developed, well nourished, NAD, appears stated age  HEENT: [place], facial brusing, mucous membranes moist.   Neck: Supple  Cardiovascular: S1 S2 auscultated, no rubs, murmurs or gallops. Regular rate and rhythm.  Respiratory: Clear to auscultation bilaterally with equal chest rise  Abdomen: Soft, nontender, nondistended, + bowel sounds  Extremities: warm dry without cyanosis clubbing or edema  Neuro: AAOx3, nonfocal  Psych: Normal affect and demeanor with intact judgement and insight   Data Reviewed: I have personally reviewed following labs and imaging studies  CBC: Recent Labs  Lab 07/20/18 1023 07/21/18 0429 07/22/18 0337  WBC 3.0* 13.9* 7.1  NEUTROABS 2.7  --   --   HGB 11.3* 10.8* 9.4*  HCT 35.6* 33.4* 28.9*  MCV 95.7 93.3 94.1  PLT 122* 170 867*   Basic Metabolic Panel: Recent Labs  Lab  07/20/18 1023 07/20/18 2258 07/21/18 0429 07/22/18 0337  NA 145 145 144 143  K 2.9* 3.6 4.5 3.5  CL 116* 112* 113* 112*  CO2 23 24 24 27   GLUCOSE 112* 98 90 120*  BUN 33* 28* 21* 16  CREATININE 1.05* 0.80 0.68 0.49  CALCIUM 6.5* 7.2* 7.1* 7.6*  MG  --  1.8 2.4 2.1  PHOS  --  3.2 2.8 2.0*   GFR: Estimated Creatinine Clearance: 72.6 mL/min (by C-G formula based on SCr of 0.49 mg/dL). Liver Function Tests: Recent Labs  Lab 07/20/18 1023 07/20/18 2258 07/21/18 0429  AST 333* 214* 169*  ALT 165* 158* 142*  ALKPHOS 41 37* 38  BILITOT 0.4 0.4 0.3  PROT 4.9* 4.9* 4.9*  ALBUMIN 2.8* 2.4* 2.4*   Recent Labs  Lab 07/21/18 0429  LIPASE 24   No results for input(s): AMMONIA in the last 168 hours. Coagulation Profile: Recent Labs  Lab 07/20/18 1023  INR 1.17   Cardiac Enzymes: No results for input(s): CKTOTAL, CKMB, CKMBINDEX, TROPONINI in the  last 168 hours. BNP (last 3 results) No results for input(s): PROBNP in the last 8760 hours. HbA1C: No results for input(s): HGBA1C in the last 72 hours. CBG: Recent Labs  Lab 07/22/18 0316 07/22/18 0336 07/22/18 0610 07/22/18 0729 07/22/18 0904  GLUCAP 65* 107* 75 69* 90   Lipid Profile: No results for input(s): CHOL, HDL, LDLCALC, TRIG, CHOLHDL, LDLDIRECT in the last 72 hours. Thyroid Function Tests: Recent Labs    07/20/18 1035  TSH 0.487   Anemia Panel: No results for input(s): VITAMINB12, FOLATE, FERRITIN, TIBC, IRON, RETICCTPCT in the last 72 hours. Urine analysis:    Component Value Date/Time   COLORURINE AMBER (A) 07/20/2018 1143   APPEARANCEUR CLOUDY (A) 07/20/2018 1143   LABSPEC 1.017 07/20/2018 1143   PHURINE 5.0 07/20/2018 1143   GLUCOSEU NEGATIVE 07/20/2018 1143   HGBUR SMALL (A) 07/20/2018 1143   BILIRUBINUR SMALL (A) 07/20/2018 1143   BILIRUBINUR neg 10/07/2013 0954   KETONESUR 5 (A) 07/20/2018 1143   PROTEINUR 100 (A) 07/20/2018 1143   UROBILINOGEN negative 10/07/2013 0954   NITRITE NEGATIVE  07/20/2018 1143   LEUKOCYTESUR NEGATIVE 07/20/2018 1143   Sepsis Labs: @LABRCNTIP (procalcitonin:4,lacticidven:4)  ) Recent Results (from the past 240 hour(s))  Culture, blood (Routine x 2)     Status: None (Preliminary result)   Collection Time: 07/20/18 10:23 AM  Result Value Ref Range Status   Specimen Description   Final    BLOOD RIGHT FOREARM Performed at [facility], [place] [address], [place], [place] [address]    Special Requests   Final    BOTTLES DRAWN AEROBIC AND ANAEROBIC Blood Culture adequate volume Performed at [facility] [address] [place], [place] [address]    Culture   Final    NO GROWTH < 24 HOURS Performed at [facility] [address] [place], [place] [address]    Report Status PENDING  Incomplete  Culture, blood (Routine x 2)     Status: None (Preliminary result)   Collection Time: 07/20/18 10:57 AM  Result Value Ref Range Status   Specimen Description   Final    BLOOD LEFT FOREARM Performed at [facility] [address] [place], [place] [address]    Special Requests   Final    BOTTLES DRAWN AEROBIC AND ANAEROBIC Blood Culture results may not be optimal due to an inadequate volume of blood received in culture bottles Performed at [facility] [address], [place], [place] [address]    Culture   Final    NO GROWTH < 24 HOURS Performed at [facility] [address] [place], [place] [address]    Report Status PENDING  Incomplete  Urine culture     Status: None   Collection Time: 07/20/18 11:43 AM  Result Value Ref Range Status   Specimen Description   Final    URINE, CATHETERIZED Performed at [facility] [address] [place], [place] [address]    Special Requests   Final    NONE Performed at [facility], [place] [address] [place], [place] [address]    Culture   Final    NO GROWTH Performed at [facility], [place] [address], [place], [place] [address]    Report Status 07/21/2018 FINAL  Final  MRSA PCR Screening     Status: None   Collection Time: 07/20/18  5:48 PM  Result Value Ref Range Status   MRSA  by PCR NEGATIVE NEGATIVE Final    Comment:        The GeneXpert MRSA Assay (FDA approved for NASAL specimens only), is one component of a comprehensive MRSA colonization surveillance program. It is not intended to diagnose MRSA infection nor to guide or monitor treatment for MRSA infections. Performed at [facility], [place] [address] [place], [place] [address]       Radiology Studies: Dg Abd 1 View  Result Date: 07/21/2018 CLINICAL DATA:  Acute onset of generalized abdominal pain. EXAM: ABDOMEN - 1 VIEW COMPARISON:  CT of the abdomen and pelvis from 06/30/2018 FINDINGS: The visualized bowel gas pattern is unremarkable. Scattered air and stool filled loops of colon are seen; no abnormal dilatation of small bowel loops is seen to suggest small bowel obstruction. No free intra-abdominal air is identified, though evaluation for free air is limited on a single supine view. The visualized osseous structures are within normal limits; the sacroiliac joints are unremarkable in appearance. Clips are noted within the right upper quadrant, reflecting prior cholecystectomy. IMPRESSION: Unremarkable bowel gas pattern; no free intra-abdominal air seen. Electronically Signed   By: Garald Balding M.D.   On: 07/21/2018 00:47   Ct Head Wo Contrast  Result Date: 07/21/2018 CLINICAL DATA:  Fall with head trauma. EXAM: CT HEAD WITHOUT CONTRAST CT CERVICAL SPINE WITHOUT CONTRAST TECHNIQUE: Multidetector CT imaging of the head and cervical spine was performed following the standard protocol without intravenous contrast. Multiplanar CT image reconstructions of the cervical spine were also generated. COMPARISON:  None. FINDINGS: CT HEAD FINDINGS Brain: There is no mass, hemorrhage or  extra-axial collection. The size and configuration of the ventricles and extra-axial CSF spaces are normal. There is no acute or chronic infarction. The brain parenchyma is normal. Vascular: No abnormal hyperdensity of the major intracranial arteries or dural venous sinuses. No intracranial atherosclerosis. Skull: The visualized skull base, calvarium and extracranial soft tissues are normal. Sinuses/Orbits: No fluid levels or advanced mucosal thickening of the visualized paranasal sinuses. No mastoid or middle ear effusion. The orbits are normal. CT CERVICAL SPINE FINDINGS Alignment: No static subluxation. Facets are aligned. Occipital condyles are normally positioned. Skull base and vertebrae: No acute fracture. Soft tissues and spinal canal: No prevertebral fluid or swelling. No visible canal hematoma. Disc levels: No advanced spinal canal or neural foraminal stenosis. Upper chest: No pneumothorax, pulmonary nodule or pleural effusion. Other: Normal visualized paraspinal cervical soft tissues. IMPRESSION: Normal head and cervical spine. Electronically Signed   By: Ulyses Jarred M.D.   On: 07/21/2018 00:31   Ct Cervical Spine Wo Contrast  Result Date: 07/21/2018 CLINICAL DATA:  Fall with head trauma. EXAM: CT HEAD WITHOUT CONTRAST CT CERVICAL SPINE WITHOUT CONTRAST TECHNIQUE: Multidetector CT imaging of the head and cervical spine was performed following the standard protocol without intravenous contrast. Multiplanar CT image reconstructions of the cervical spine were also generated. COMPARISON:  None. FINDINGS: CT HEAD FINDINGS Brain: There is no mass, hemorrhage or extra-axial collection. The size and configuration of the ventricles and extra-axial CSF spaces are normal. There is no acute or chronic infarction. The brain parenchyma is normal. Vascular: No abnormal hyperdensity of the major intracranial arteries or dural venous sinuses. No intracranial atherosclerosis. Skull: The visualized skull base,  calvarium and extracranial soft tissues are normal. Sinuses/Orbits: No fluid levels or advanced mucosal thickening of the visualized paranasal sinuses. No mastoid or middle ear effusion. The orbits are normal. CT CERVICAL SPINE FINDINGS Alignment: No static subluxation. Facets are  aligned. Occipital condyles are normally positioned. Skull base and vertebrae: No acute fracture. Soft tissues and spinal canal: No prevertebral fluid or swelling. No visible canal hematoma. Disc levels: No advanced spinal canal or neural foraminal stenosis. Upper chest: No pneumothorax, pulmonary nodule or pleural effusion. Other: Normal visualized paraspinal cervical soft tissues. IMPRESSION: Normal head and cervical spine. Electronically Signed   By: Ulyses Jarred M.D.   On: 07/21/2018 00:31   Dg Chest Port 1 View  Result Date: 07/22/2018 CLINICAL DATA:  Respiratory failure. EXAM: PORTABLE CHEST 1 VIEW COMPARISON:  07/20/2018. FINDINGS: Normal sized heart. Increased dense opacity at the right lung base. Minimal atelectasis at the left lung base. Thoracic spine degenerative changes. IMPRESSION: 1. Increased dense right basilar atelectasis or pneumonia. 2. Minimal left basilar atelectasis. Electronically Signed   By: Claudie Revering M.D.   On: 07/22/2018 07:39   Dg Chest Port 1 View  Result Date: 07/20/2018 CLINICAL DATA:  Increasing weakness.  Chronic respiratory failure. EXAM: PORTABLE CHEST 1 VIEW COMPARISON:  Earlier today. FINDINGS: Normal sized heart. Dense airspace opacity at the right lung base with a more consolidated appearance and some volume loss. Clear left lung. Thoracic spine degenerative changes. IMPRESSION: Some interval volume loss associated with dense right basilar airspace opacity suspicious for pneumonia with superimposed atelectasis. See the previous report for follow-up recommendations. Electronically Signed   By: Claudie Revering M.D.   On: 07/20/2018 14:58     Scheduled Meds: . cycloSPORINE  1 drop Both Eyes  BID  . divalproex  500 mg Oral BID  . enoxaparin (LOVENOX) injection  40 mg Subcutaneous Q24H  . famotidine  20 mg Oral BID  . gabapentin  300 mg Oral BID  . insulin aspart  1-3 Units Subcutaneous Q4H  . levothyroxine  125 mcg Oral QAC breakfast  . olopatadine  1 drop Both Eyes q morning - 10a  . sertraline  100 mg Oral Daily   Continuous Infusions: . sodium chloride    . sodium chloride    . lactated ringers 125 mL/hr at 07/22/18 0909  . norepinephrine (LEVOPHED) Adult infusion Stopped (07/22/18 0039)  . piperacillin-tazobactam (ZOSYN)  IV 12.5 mL/hr at 07/22/18 0909     LOS: 2 days   Time Spent in minutes   30 minutes  Vernie Vinciguerra D.O. on 07/22/2018 at 11:39 AM  Between 7am to 7pm - Pager - [phone]  After 7pm go to [email or web address] - password [email or web address]  And look for the night coverage person covering for me after hours  [facility] Office  [phone]

## 2018-07-22 NOTE — Progress Notes (Signed)
eLink Physician-Brief Progress Note Patient Name: Morgan [name] DOB: Apr 23, 1966 MRN: [id number]   Date of Service  07/22/2018  HPI/Events of Note  Hypotension - BP = 82/52 with MAP = 60.   eICU Interventions  Will bolus with ).9 NaCl 1 lter IV over 1 hour now.      Intervention Category Major Interventions: Hypotension - evaluation and management  Inanna Telford Eugene 07/22/2018, 11:40 PM

## 2018-07-22 NOTE — Progress Notes (Signed)
PULMONARY / CRITICAL CARE MEDICINE   Name: Morgan [name] MRN: [id number] DOB: 10/21/1966    ADMISSION DATE:  07/20/2018   REFERRING MD:  Dr. Zenia Resides, ER  CHIEF COMPLAINT:  Hypotension  HISTORY OF PRESENT ILLNESS:   52 year old woman who lost 175 pounds after gastric bypass, admitted with hypotension after a fall at home.  Diagnosed with CAP and sepsis.   SUBJECTIVE:  Patient states she feels much better. Appetite has improved, denies dyspnea. Cough is productive. Has been up to chair.    STUDIES:   CULTURES: Blood 8/09 >>ng Urine 8/09 >> Pneumococcal Ag 8/09 >>neg  ANTIBIOTICS: Vancomycin 8/09 >>  Zosyn 8/09 >>  Rocephin 8/09 >> 8/9 Zithromax 8/09 >> 8/9  SIGNIFICANT EVENTS: 8/09 Admit  LINES/TUBES: PIV >>  SUBJ  -remain hypotensive overnight requiring restarting Levophed but now off again. Received fluid bolus overnight. Denies dizziness or shortness of breath or chest pain  VITAL SIGNS: BP 126/82   Pulse (!) 56   Temp 98.5 F (36.9 C) (Oral)   Resp 19   Ht 5\' 4"  (1.626 m)   Wt 56.7 kg   LMP 03/17/2017   SpO2 97%   BMI 21.46 kg/m   INTAKE / OUTPUT: I/O last 3 completed shifts: In: 7217.6 [P.O.:720; I.V.:3503.1; IV Piggyback:2994.5] Out: 2365 [Urine:2365]  PHYSICAL EXAMINATION:  Gen. Pleasant, obese, in no distress, normal affect ENT - no pallor, icterus, no post nasal drip, class 2 airway Neck: No JVD, no thyromegaly, no carotid bruits Lungs: no use of accessory muscles, occasional rhonchi and bronchial breathing at R base. Cardiovascular: Rhythm regular, heart sounds  normal, no murmurs or gallops, no peripheral edema Abdomen: soft and non-tender, no hepatosplenomegaly, BS normal. Musculoskeletal: No deformities, no cyanosis or clubbing Neuro:  alert, non focal, no tremors  LABS:  BMET Recent Labs  Lab 07/20/18 2258 07/21/18 0429 07/22/18 0337  NA 145 144 143  K 3.6 4.5 3.5  CL 112* 113* 112*  CO2 24 24 27   BUN 28* 21* 16   CREATININE 0.80 0.68 0.49  GLUCOSE 98 90 120*    Electrolytes Recent Labs  Lab 07/20/18 2258 07/21/18 0429 07/22/18 0337  CALCIUM 7.2* 7.1* 7.6*  MG 1.8 2.4 2.1  PHOS 3.2 2.8 2.0*    CBC Recent Labs  Lab 07/20/18 1023 07/21/18 0429 07/22/18 0337  WBC 3.0* 13.9* 7.1  HGB 11.3* 10.8* 9.4*  HCT 35.6* 33.4* 28.9*  PLT 122* 170 109*    Coag's Recent Labs  Lab 07/20/18 1023  INR 1.17    Sepsis Markers Recent Labs  Lab 07/20/18 2258 07/21/18 0139 07/21/18 0429 07/22/18 0337  LATICACIDVEN 1.1 2.3* 0.9  --   PROCALCITON 37.17  --  29.88 16.44    ABG Recent Labs  Lab 07/20/18 2343  PHART 7.388  PCO2ART 42.1  PO2ART 74.2*    Liver Enzymes Recent Labs  Lab 07/20/18 1023 07/20/18 2258 07/21/18 0429  AST 333* 214* 169*  ALT 165* 158* 142*  ALKPHOS 41 37* 38  BILITOT 0.4 0.4 0.3  ALBUMIN 2.8* 2.4* 2.4*    Cardiac Enzymes No results for input(s): TROPONINI, PROBNP in the last 168 hours.  Glucose Recent Labs  Lab 07/22/18 0336 07/22/18 0610 07/22/18 0729 07/22/18 0904 07/22/18 1158 07/22/18 1601  GLUCAP 107* 75 69* 90 71 75    Imaging  Chest x-ray from 8/9 personally reviewed shows right lower lobe consolidation  DISCUSSION: 52 yo female with sepsis from RLL HCAP. Lactate and hypotension have resolved.  ASSESSMENT /  PLAN:  Sepsis has resolved  - Stop iv fluids.   Rt lower lobar CAP - Can step down to oral cefdinir.  Ambulate and plan for discharge.  - Complete 7days of antibiotics. - Follow-up imaging with PCP in 1 months to ensure resolution of infiltrates.  Acute hypoxic respiratory failure. - oxygen to keep SpO2 > 92%  Hx of Bipolar disorder, chronic headaches. -Resume Depakote, neurontin, zoloft, tramadol, geodon, zanaflex  DVT prophylaxis - Lovenox  Ready for transfer to floor. Will sign-off at this time.  Kipp Brood, MD [facility] ICU Physician [place]  Pager: [phone] Mobile:  [phone] After hours: [phone].  07/22/2018, 4:46 PM

## 2018-07-22 NOTE — Progress Notes (Signed)
eLink Physician-Brief Progress Note Patient Name: RHYANNA SORCE DOB: 1966-06-29 MRN: [id number]   Date of Service  07/22/2018  HPI/Events of Note  Bradycardia while asleep - HR decreased into mid 40's. BP = 88/57 with MAP = 65. Multiple medications restarted today and have been reviewed for association with bradycardia. Geodon which she received earlier tonight has a < 2% incidence of bradycardia and Zoloft has a < 1% incidence of bradycardia. Patient is now awake and HR = 54 and BP = 97/58 with MAP = 69.   eICU Interventions  Will order: 1. Hold Geodon until patient re-evaluated by the rounding team in the AM.  2. Scopolamine 1.5 mg patch to skin now and Q 72 hours.  3. Will continue Zoloft for present, however, if bradycardia persists may need to hold Zoloft as well.         Colsen Modi Eugene 07/22/2018, 9:22 PM

## 2018-07-23 ENCOUNTER — Inpatient Hospital Stay (HOSPITAL_COMMUNITY): Payer: Medicare Other

## 2018-07-23 ENCOUNTER — Encounter (HOSPITAL_COMMUNITY): Payer: Self-pay | Admitting: Radiology

## 2018-07-23 DIAGNOSIS — N179 Acute kidney failure, unspecified: Secondary | ICD-10-CM

## 2018-07-23 DIAGNOSIS — I951 Orthostatic hypotension: Secondary | ICD-10-CM

## 2018-07-23 DIAGNOSIS — I959 Hypotension, unspecified: Secondary | ICD-10-CM

## 2018-07-23 DIAGNOSIS — R001 Bradycardia, unspecified: Secondary | ICD-10-CM

## 2018-07-23 DIAGNOSIS — J9811 Atelectasis: Secondary | ICD-10-CM

## 2018-07-23 DIAGNOSIS — E162 Hypoglycemia, unspecified: Secondary | ICD-10-CM

## 2018-07-23 LAB — CBC
HCT: 29.3 % — ABNORMAL LOW (ref 36.0–46.0)
HEMOGLOBIN: 9.4 g/dL — AB (ref 12.0–15.0)
MCH: 30.4 pg (ref 26.0–34.0)
MCHC: 32.1 g/dL (ref 30.0–36.0)
MCV: 94.8 fL (ref 78.0–100.0)
Platelets: 125 10*3/uL — ABNORMAL LOW (ref 150–400)
RBC: 3.09 MIL/uL — AB (ref 3.87–5.11)
RDW: 13.7 % (ref 11.5–15.5)
WBC: 7.2 10*3/uL (ref 4.0–10.5)

## 2018-07-23 LAB — GLUCOSE, CAPILLARY
GLUCOSE-CAPILLARY: 100 mg/dL — AB (ref 70–99)
GLUCOSE-CAPILLARY: 125 mg/dL — AB (ref 70–99)
GLUCOSE-CAPILLARY: 130 mg/dL — AB (ref 70–99)
GLUCOSE-CAPILLARY: 132 mg/dL — AB (ref 70–99)
GLUCOSE-CAPILLARY: 204 mg/dL — AB (ref 70–99)
GLUCOSE-CAPILLARY: 61 mg/dL — AB (ref 70–99)
GLUCOSE-CAPILLARY: 67 mg/dL — AB (ref 70–99)
GLUCOSE-CAPILLARY: 68 mg/dL — AB (ref 70–99)
GLUCOSE-CAPILLARY: 74 mg/dL (ref 70–99)
GLUCOSE-CAPILLARY: 81 mg/dL (ref 70–99)
Glucose-Capillary: 104 mg/dL — ABNORMAL HIGH (ref 70–99)
Glucose-Capillary: 126 mg/dL — ABNORMAL HIGH (ref 70–99)
Glucose-Capillary: 59 mg/dL — ABNORMAL LOW (ref 70–99)
Glucose-Capillary: 63 mg/dL — ABNORMAL LOW (ref 70–99)
Glucose-Capillary: 66 mg/dL — ABNORMAL LOW (ref 70–99)

## 2018-07-23 LAB — HEPATIC FUNCTION PANEL
ALT: 55 U/L — ABNORMAL HIGH (ref 0–44)
AST: 35 U/L (ref 15–41)
Albumin: 1.9 g/dL — ABNORMAL LOW (ref 3.5–5.0)
Alkaline Phosphatase: 32 U/L — ABNORMAL LOW (ref 38–126)
BILIRUBIN TOTAL: 0.3 mg/dL (ref 0.3–1.2)
Total Protein: 4 g/dL — ABNORMAL LOW (ref 6.5–8.1)

## 2018-07-23 LAB — ECHOCARDIOGRAM COMPLETE
Height: 64 in
WEIGHTICAEL: 2000 [oz_av]

## 2018-07-23 LAB — BASIC METABOLIC PANEL
ANION GAP: 3 — AB (ref 5–15)
BUN: 12 mg/dL (ref 6–20)
CHLORIDE: 115 mmol/L — AB (ref 98–111)
CO2: 28 mmol/L (ref 22–32)
Calcium: 7.3 mg/dL — ABNORMAL LOW (ref 8.9–10.3)
Creatinine, Ser: 0.5 mg/dL (ref 0.44–1.00)
GFR calc Af Amer: >60 mL/min (ref >60)
GFR calc non Af Amer: >60 mL/min (ref >60)
GLUCOSE: 132 mg/dL — AB (ref 70–99)
POTASSIUM: 3.6 mmol/L (ref 3.5–5.1)
Sodium: 146 mmol/L — ABNORMAL HIGH (ref 135–145)

## 2018-07-23 MED ORDER — DEXTROSE-NACL 5-0.45 % IV SOLN
INTRAVENOUS | Status: DC
  Administered 2018-07-23 – 2018-07-24 (×2): via INTRAVENOUS

## 2018-07-23 MED ORDER — DEXTROSE 5 % AND 0.9 % NACL IV BOLUS
1000.0000 mL | Freq: Once | INTRAVENOUS | Status: AC
  Administered 2018-07-23: 1000 mL via INTRAVENOUS

## 2018-07-23 MED ORDER — IOHEXOL 300 MG/ML  SOLN
75.0000 mL | Freq: Once | INTRAMUSCULAR | Status: AC | PRN
  Administered 2018-07-23: 75 mL via INTRAVENOUS

## 2018-07-23 MED ORDER — NYSTATIN 100000 UNIT/GM EX POWD
Freq: Two times a day (BID) | CUTANEOUS | Status: DC
  Administered 2018-07-23 – 2018-07-24 (×3): via TOPICAL
  Filled 2018-07-23: qty 15

## 2018-07-23 MED ORDER — DEXTROSE 50 % IV SOLN
INTRAVENOUS | Status: AC
  Administered 2018-07-23: 25 mL via INTRAVENOUS
  Filled 2018-07-23: qty 50

## 2018-07-23 MED ORDER — POTASSIUM CHLORIDE CRYS ER 20 MEQ PO TBCR
40.0000 meq | EXTENDED_RELEASE_TABLET | Freq: Once | ORAL | Status: AC
  Administered 2018-07-23: 40 meq via ORAL
  Filled 2018-07-23: qty 2

## 2018-07-23 MED ORDER — DEXTROSE 50 % IV SOLN
25.0000 mL | INTRAVENOUS | Status: DC | PRN
  Administered 2018-07-22 – 2018-07-24 (×4): 25 mL via INTRAVENOUS
  Filled 2018-07-23 (×2): qty 50

## 2018-07-23 NOTE — Progress Notes (Signed)
PROGRESS NOTE    Morgan [name]  EXB:[id number] DOB: [birth year] DOA: 07/20/2018 PCP: Morgan Norlander, DO   Brief Narrative:  HPI on 07/20/2018 by Morgan [name] 52 yo female developed dizziness over past 24 hours.  She fell at home and hit her head.  Also felt shaky.  Family reports she hasn't been eating much.  Noted to have low blood pressure and given fluid.  Didn't have immediate improvement in BP and started on levophed.  After getting more IV fluid she was able to come off of levophed.  She had elevated lactic acid that improved after getting IV fluid.  She was found to have Rt lower lung lobar pneumonia and started on ABx.   She denies sinus congestion, sore throat, chest pain, nausea, vomiting, diarrhea, fever, chills, abdominal pain, skin rash, leg swelling, or sick exposures. She feels better after getting IV fluids and ABx in ER.  Interim history Admitted for hypotension and sepsis secondary to pneumonia. Admitted to PCCM, and placed on pressors.  BP holding stable off of pressors, patient was then transferred to [facility] service on 07/22/2018.  Assessment & Plan   Sepsis/shock secondary to right lower lobe pneumonia -Presented with hypotension, leukocytosis, -Patient did require levo fed for hypotension.  Has been discontinued and BP has remained stable. -Chest x-ray reviewed showing a right lower lobe infiltrate; of note patient was hospitalized approximately 1 month ago -Initially placed on ceftriaxone and doxy however transition to vancomycin and Zosyn, vancomycin was discontinued as PCR for MRSA was negative -Calcitonin improving -Blood cultures show no growth to date -Strep pneumonia urine antigen negative -UA showed 21-50 WBC, negative leukocytes and nitrites; shows no growth  Acute hypoxic respiratory failure -VBG on admission showed severe respiratory acidosis, repeat ABG showed acidosis had resolved -Likely secondary to pneumonia, continue supplemental oxygen to  maintain saturations above 92% -Continue incentive spirometry and flutter valve -will wean O2 if possible  Prerenal azotemia/AKI secondary to hypovolemia -Resolved, continue to monitor BMP  Sinus bradycardia  -TSH WNL -patient asymptomatic, currently maintaining HR in 50s, however drips into low 40s when sleeping -suspect this may be patient's baseline given her weight loss -will obtain echocardiogram  -Cardiology consulted and appreciated  Fall -Secondary to hypotension -CT head and C-spine unremarkable -PT and OT consulted  Hypokalemia  -Potassium currently 3.6, will supplement for goal of 4 -Continue to monitor BMP  Hypothyroidism -Continue Synthroid -TSH 0.487  Elevated LFTs -Likely secondary to hypotension, will continue to follow  GERD -Continue PPI  Leukopenia, thrombocytopenia -Likely secondary to sepsis, improving  -Continue to monitor CBC  History of bipolar disorder, chronic headaches -Continue Depakote, Neurontin, Zoloft, tramadol, Geodon, Zanaflex  DVT Prophylaxis  lovenox  Code Status: Full  Family Communication: None at bedside  Disposition Plan: admitted, Dispo pending PT/OT, cardiology consult.   Consultants PCCM Cardiology   Procedures  None  Antibiotics   Anti-infectives (From admission, onward)   Start     Dose/Rate Route Frequency Ordered Stop   07/22/18 2200  cefdinir (OMNICEF) capsule 300 mg     300 mg Oral Every 12 hours 07/22/18 1656 07/28/18 2159   07/21/18 0800  vancomycin (VANCOCIN) IVPB 1000 mg/200 mL premix  Status:  Discontinued     1,000 mg 200 mL/hr over 60 Minutes Intravenous Every 24 hours 07/20/18 2358 07/21/18 1129   07/20/18 2359  piperacillin-tazobactam (ZOSYN) IVPB 3.375 g  Status:  Discontinued     3.375 g 12.5 mL/hr over 240 Minutes Intravenous Every 8 hours  07/20/18 2323 07/22/18 1656   07/20/18 1600  cefTRIAXone (ROCEPHIN) 1 g in sodium chloride 0.9 % 100 mL IVPB  Status:  Discontinued     1 g 200 mL/hr  over 30 Minutes Intravenous Every 24 hours 07/20/18 1418 07/20/18 2316   07/20/18 1600  azithromycin (ZITHROMAX) 500 mg in sodium chloride 0.9 % 250 mL IVPB  Status:  Discontinued     500 mg 250 mL/hr over 60 Minutes Intravenous Every 24 hours 07/20/18 1418 07/20/18 1513   07/20/18 1600  doxycycline (VIBRAMYCIN) 100 mg in sodium chloride 0.9 % 250 mL IVPB  Status:  Discontinued     100 mg 125 mL/hr over 120 Minutes Intravenous Every 12 hours 07/20/18 1513 07/20/18 2316   07/20/18 1045  piperacillin-tazobactam (ZOSYN) IVPB 3.375 g     3.375 g 100 mL/hr over 30 Minutes Intravenous  Once 07/20/18 1044 07/20/18 1140   07/20/18 1045  vancomycin (VANCOCIN) IVPB 1000 mg/200 mL premix     1,000 mg 200 mL/hr over 60 Minutes Intravenous  Once 07/20/18 1044 07/20/18 1222      Subjective:   Morgan [name] seen and examined today.  Has no complaints today. Would like to go home soon. Denies current chest pain, palpitations, shortness of breath, abdominal pain, N/V/D/C, dizziness, headache.  Objective:   Vitals:   07/23/18 0545 07/23/18 0600 07/23/18 0615 07/23/18 0630  BP: 127/76 115/80 119/77 119/78  Pulse: 60 (!) 54 (!) 54 (!) 56  Resp: 14 14 14 15   Temp:      TempSrc:      SpO2: 94% 98% 98% 99%  Weight:      Height:        Intake/Output Summary (Last 24 hours) at 07/23/2018 0921 Last data filed at 07/23/2018 0600 Gross per 24 hour  Intake 3326.48 ml  Output 1615 ml  Net 1711.48 ml   Filed Weights   07/20/18 1036  Weight: 56.7 kg    Exam  General: Well developed, well nourished, NAD, appears stated age  HEENT: Morgan [name], facial brusing, mucous membranes moist.   Neck: Supple  Cardiovascular: S1 S2 auscultated, no rubs, murmurs or gallops. Regular rate and rhythm.  Respiratory: Clear to auscultation bilaterally with equal chest rise  Abdomen: Soft, nontender, nondistended, + bowel sounds  Extremities: warm dry without cyanosis clubbing or edema  Neuro: AAOx3,  nonfocal  Psych: Normal affect and demeanor with intact judgement and insight   Data Reviewed: I have personally reviewed following labs and imaging studies  CBC: Recent Labs  Lab 07/20/18 1023 07/21/18 0429 07/22/18 0337 07/23/18 0354  WBC 3.0* 13.9* 7.1 7.2  NEUTROABS 2.7  --   --   --   HGB 11.3* 10.8* 9.4* 9.4*  HCT 35.6* 33.4* 28.9* 29.3*  MCV 95.7 93.3 94.1 94.8  PLT 122* 170 109* 650*   Basic Metabolic Panel: Recent Labs  Lab 07/20/18 1023 07/20/18 2258 07/21/18 0429 07/22/18 0337 07/23/18 0354  NA 145 145 144 143 146*  K 2.9* 3.6 4.5 3.5 3.6  CL 116* 112* 113* 112* 115*  CO2 23 24 24 27 28   GLUCOSE 112* 98 90 120* 132*  BUN 33* 28* 21* 16 12  CREATININE 1.05* 0.80 0.68 0.49 0.50  CALCIUM 6.5* 7.2* 7.1* 7.6* 7.3*  MG  --  1.8 2.4 2.1  --   PHOS  --  3.2 2.8 2.0*  --    GFR: Estimated Creatinine Clearance: 72.6 mL/min (by C-G formula based on SCr of 0.5 mg/dL). Liver Function  Tests: Recent Labs  Lab 07/20/18 1023 07/20/18 2258 07/21/18 0429  AST 333* 214* 169*  ALT 165* 158* 142*  ALKPHOS 41 37* 38  BILITOT 0.4 0.4 0.3  PROT 4.9* 4.9* 4.9*  ALBUMIN 2.8* 2.4* 2.4*   Recent Labs  Lab 07/21/18 0429  LIPASE 24   No results for input(s): AMMONIA in the last 168 hours. Coagulation Profile: Recent Labs  Lab 07/20/18 1023  INR 1.17   Cardiac Enzymes: No results for input(s): CKTOTAL, CKMB, CKMBINDEX, TROPONINI in the last 168 hours. BNP (last 3 results) No results for input(s): PROBNP in the last 8760 hours. HbA1C: No results for input(s): HGBA1C in the last 72 hours. CBG: Recent Labs  Lab 07/23/18 0139 07/23/18 0300 07/23/18 0359 07/23/18 0754 07/23/18 0814  GLUCAP 125* 204* 126* 61* 67*   Lipid Profile: No results for input(s): CHOL, HDL, LDLCALC, TRIG, CHOLHDL, LDLDIRECT in the last 72 hours. Thyroid Function Tests: Recent Labs    07/20/18 1035  TSH 0.487   Anemia Panel: No results for input(s): VITAMINB12, FOLATE, FERRITIN,  TIBC, IRON, RETICCTPCT in the last 72 hours. Urine analysis:    Component Value Date/Time   COLORURINE AMBER (A) 07/20/2018 1143   APPEARANCEUR CLOUDY (A) 07/20/2018 1143   LABSPEC 1.017 07/20/2018 1143   PHURINE 5.0 07/20/2018 1143   GLUCOSEU NEGATIVE 07/20/2018 1143   HGBUR SMALL (A) 07/20/2018 1143   BILIRUBINUR SMALL (A) 07/20/2018 1143   BILIRUBINUR neg 10/07/2013 0954   KETONESUR 5 (A) 07/20/2018 1143   PROTEINUR 100 (A) 07/20/2018 1143   UROBILINOGEN negative 10/07/2013 0954   NITRITE NEGATIVE 07/20/2018 1143   LEUKOCYTESUR NEGATIVE 07/20/2018 1143   Sepsis Labs: @LABRCNTIP (procalcitonin:4,lacticidven:4)  ) Recent Results (from the past 240 hour(s))  Culture, blood (Routine x 2)     Status: None (Preliminary result)   Collection Time: 07/20/18 10:23 AM  Result Value Ref Range Status   Specimen Description   Final    BLOOD RIGHT FOREARM Performed at [facility], [place] [address], [place], [place] [address]    Special Requests   Final    BOTTLES DRAWN AEROBIC AND ANAEROBIC Blood Culture adequate volume Performed at [facility] [address], [place], [place] [address]    Culture   Final    NO GROWTH 2 DAYS Performed at [facility] [address], [place], [place] [address]    Report Status PENDING  Incomplete  Culture, blood (Routine x 2)     Status: None (Preliminary result)   Collection Time: 07/20/18 10:57 AM  Result Value Ref Range Status   Specimen Description   Final    BLOOD LEFT FOREARM Performed at [facility] [address] [place], [place] [address]    Special Requests   Final    BOTTLES DRAWN AEROBIC AND ANAEROBIC Blood Culture results may not be optimal due to an inadequate volume of blood received in culture bottles Performed at [facility] [address], [place], [place] [address]    Culture   Final    NO GROWTH 2 DAYS Performed at [facility] [address], [place], [place] [address]    Report Status PENDING  Incomplete  Urine culture     Status: None   Collection Time: 07/20/18 11:43 AM  Result Value Ref Range Status   Specimen Description   Final    URINE, CATHETERIZED Performed at [place] [place] [address] [place], [place]  [address]    Special Requests   Final    NONE Performed at [facility], [place] [address] [place], [place] [address]    Culture   Final    NO GROWTH Performed at [facility], [place] [address], [place], [place] [address]    Report Status 07/21/2018 FINAL  Final  MRSA PCR Screening     Status: None   Collection Time: 07/20/18  5:48 PM  Result Value Ref Range Status   MRSA by PCR NEGATIVE NEGATIVE Final    Comment:        The GeneXpert MRSA Assay (FDA approved for NASAL specimens only), is one component of a comprehensive MRSA colonization surveillance program. It is not intended to diagnose MRSA infection nor to guide or monitor treatment for MRSA infections. Performed at [facility] [place] [address], [place], [place] [address]   Expectorated sputum assessment w rflx to resp cult     Status: None   Collection Time: 07/20/18 11:41 PM  Result Value Ref Range Status   Specimen Description SPUTUM  Final   Special Requests NONE  Final   Sputum evaluation   Final    THIS SPECIMEN IS ACCEPTABLE FOR SPUTUM CULTURE Performed at [facility], [place] [address], [place], [place] [address]    Report Status 07/22/2018 FINAL  Final  Culture, respiratory     Status: None (Preliminary result)   Collection Time: 07/20/18 11:41 PM  Result Value Ref Range Status   Specimen Description   Final    SPUTUM Performed at [facility] [address], [place], [place] [address]    Special Requests   Final    NONE Reflexed from [phone] Performed at [facility] [address],  [place], [place] [address]    Gram Stain   Final    MODERATE WBC PRESENT,BOTH PMN AND MONONUCLEAR RARE GRAM POSITIVE COCCI IN PAIRS RARE GRAM POSITIVE RODS Performed at [facility], [place] [address], [place], [place] [address]    Culture FEW GRAM NEGATIVE RODS  Final   Report Status PENDING  Incomplete      Radiology Studies: Dg Chest Port 1 View  Result Date: 07/22/2018 CLINICAL DATA:  Respiratory failure. EXAM: PORTABLE CHEST 1 VIEW COMPARISON:  07/20/2018. FINDINGS: Normal sized heart. Increased dense opacity at the right lung base. Minimal atelectasis at the left lung base. Thoracic spine degenerative changes. IMPRESSION: 1. Increased dense right basilar atelectasis or pneumonia. 2. Minimal left basilar atelectasis. Electronically Signed   By: Claudie Revering M.D.   On: 07/22/2018 07:39     Scheduled Meds: . cefdinir  300 mg Oral Q12H  . cycloSPORINE  1 drop Both Eyes BID  . divalproex  500 mg Oral BID  . enoxaparin (LOVENOX) injection  40 mg Subcutaneous Q24H  . gabapentin  300 mg Oral BID  . insulin aspart  1-3 Units Subcutaneous Q4H  . levothyroxine  125 mcg Oral QAC breakfast  . olopatadine  1 drop Both Eyes q morning - 10a  . pantoprazole  40 mg Oral Daily  . potassium chloride  40 mEq Oral Once  . scopolamine  1 patch Transdermal Q72H  . sertraline  100 mg Oral Daily  . ziprasidone  60 mg Oral QPM   Continuous Infusions: . sodium chloride 10 mL/hr at 07/22/18 2142  . dextrose 5 % and 0.45% NaCl 40 mL/hr at 07/23/18 0600     LOS: 3 days  Time Spent in minutes   30 minutes  Danelle Curiale D.O. on 07/23/2018 at 9:21 AM  Between 7am to 7pm - Pager - [phone]  After 7pm go to [email or web address] - password [email or web address]  And look for the night coverage person covering for me after hours  [facility] Office  [phone]

## 2018-07-23 NOTE — Progress Notes (Signed)
I came to do the echo but the patient was going to CT.

## 2018-07-23 NOTE — Progress Notes (Signed)
eLink Physician-Brief Progress Note Patient Name: Morgan [name] DOB: July 04, 1966 MRN: [id number]   Date of Service  07/23/2018  HPI/Events of Note  Multiple issues: 1. Blood glucose in 60's again and 2. BP = 76/45 with MAP = 52.   eICU Interventions  Will order: 1. Bolus with 0.9 NaCl 1 liter IV over 1 hour now.  2. D5 0.45 NaCl to run IV at 40 mL/hour.     Intervention Category Major Interventions: Hypotension - evaluation and management;Other:  Lysle Dingwall 07/23/2018, 1:57 AM

## 2018-07-23 NOTE — Progress Notes (Signed)
[facility] notified for lowered blood pressure of 72/42. Rechecked manually with finding of 88/54. Liter bolus given per order. Will continue to monitor blood pressures.

## 2018-07-23 NOTE — Progress Notes (Signed)
Hypoglycemic Event  CBG: 66  Treatment: Juice and Crackers  Symptoms: None  Follow-up CBG: Time: 01:10 CBG Result: 63  Follow up Treatment: 79mL D50  Follow-up CBG Time: 01:35 Follow-up CBG: 125  Possible Reasons for Event: Unknown  Comments/MD notified: No, per hypoglycemic protocol.     Arthor Captain Jenaye Rickert

## 2018-07-23 NOTE — Progress Notes (Addendum)
Inpatient Diabetes Program Recommendations  AACE/ADA: New Consensus Statement on Inpatient Glycemic Control (2015)  Target Ranges:  Prepandial:   less than 140 mg/dL      Peak postprandial:   less than 180 mg/dL (1-2 hours)      Critically ill patients:  140 - 180 mg/dL   Results for ALVILDA, MCKENNA (MRN [id number]) as of 07/23/2018 12:56  Ref. Range 07/21/2018 23:53 07/22/2018 03:16 07/22/2018 03:36 07/22/2018 06:10 07/22/2018 07:29 07/22/2018 09:04 07/22/2018 11:58 07/22/2018 16:01 07/22/2018 21:07 07/22/2018 21:28 07/22/2018 23:09  Glucose-Capillary Latest Ref Range: 70 - 99 mg/dL 75 65 (L) 107 (H) 75 69 (L) 90 71 75 65 (L) 79 92   Results for OLUWASEMILORE, PASCUZZI (MRN [id number]) as of 07/23/2018 12:56  Ref. Range 07/23/2018 00:52 07/23/2018 01:10 07/23/2018 01:39 07/23/2018 03:00 07/23/2018 03:59 07/23/2018 07:54 07/23/2018 08:14 07/23/2018 09:52 07/23/2018 11:52  Glucose-Capillary Latest Ref Range: 70 - 99 mg/dL 66 (L) 63 (L) 125 (H) 204 (H) 126 (H) 61 (L) 67 (L) 100 (H) 81    Admit with: Sepsis/ Pneumonia  History: DM  Home DM Meds: None  Current Orders: Novolog 1-2-3 units Q4 hours per the ICU Glycemic Control Protocol      MD- Note patient having episodes of Hypoglycemia since admission.  Has not received any Novolog SSI coverage since admission on 08/09.  Please consider d/c of Novolog SSI for now but continue CBG checks Q4 hours     --Will follow patient during hospitalization--  Wyn Quaker RN, MSN, CDE Diabetes Coordinator Inpatient Glycemic Control Team Team Pager: [phone] (8a-5p)

## 2018-07-23 NOTE — Care Management Note (Signed)
Case Management Note  Patient Details  Name: Morgan [name] MRN: [id number] Date of Birth: May 27, 1966  Subjective/Objective:       Pna/hypotension on admit now resolved/sputum culture -gpc in pairs and gnr/d51/2ns at kvo/p.o. abx/             Action/Plan: will follow for cm needs   Expected Discharge Date:  (unknown)               Expected Discharge Plan:  Home/Self Care  In-House Referral:     Discharge planning Services  CM Consult  Post Acute Care Choice:    Choice offered to:     DME Arranged:    DME Agency:     HH Arranged:    [place] Agency:     Status of Service:  In process, will continue to follow  If discussed at Long Length of Stay Meetings, dates discussed:    Additional Comments:  Leeroy Cha, RN 07/23/2018, 10:19 AM

## 2018-07-23 NOTE — Consult Note (Signed)
Cardiology Consultation:   Patient ID: Morgan [name]; [id number]; October 29, 1966   Admit date: 07/20/2018 Date of Consult: 07/23/2018  Primary Care Provider: Janora Norlander, DO Primary Cardiologist: Morgan Latch, MD   Patient Profile:   Morgan [name] is a 52 y.o. female with a hx of OSA no longer on CPAP since weight loss, hypertension, diabetes, hyperlipidemia, morbid obesity s/p gastric surgery 03/2017, osteoarthritis and bipolar disorder who is being seen today for the evaluation of bradycardia at the request of Morgan [name].  History of Present Illness:   Morgan [name] admitted to [facility] on 07/20/2018 after developing dizziness for the prior 24 hours and then a fall and hitting her head.  Family had reported that the patient had not been eating much.  She was noted to have low blood pressure and was given fluid with no immediate improvement so she was started on Levophed.  After getting more IV fluids she was able to come off Levophed.  He was found to have right lower lobe pneumonia and started on IV antibiotics.  She is being treated for sepsis related to right lower lobe pneumonia and shock.  She had severe respiratory acidosis on admission but this is now resolved.  She had elevated LFTs likely secondary to hypotension and AKI also likely secondary to hypovolemia.  Renal function has improved.  Cardiology was consulted for bradycardia.  The patient's heart rate got down into the 50s with occasional drops into the upper 40s last night.  Currently her heart rate is in the 70s.  Morgan [name] denies having had any symptoms of low heart rate, no lightheadedness, chest discomfort, shortness of breath, dizziness.  Morgan [name] reports that she has lost about 185 pounds since her weight loss surgery and 03/2017.  She has had a lot of problems with abdominal pain, recent hernia repair and nutrition.  She has been trying to eat to maintain nutrition but the abdominal pain, lack of  appetite and early satiety have made it difficult.  She does have a G-tube present but she says this is only for back-up use.  He also had hypoglycemia during the night, requiring treatment.  Morgan [name] was seen in 08/31/2016 by Morgan [name] for presurgical risk assessment prior to her weight loss surgery.  Patient at that time was unable to walk 4 blocks due to shortness of breath and a Lexiscan Myoview was done and was low risk with no ischemia identified.  He was noted at that time to have asymptomatic bradycardia, 49 bpm, and was taking propranolol for migraine prevention.  Propranolol has since been changed to Depakote.  In review of office notes from 2019, her heart rates have been in the low 60s at the office visits.  Morgan [name] says that she still cannot walk 4 blocks or climb stairs, mostly due to continued knee pain.  She also has back pain with pain down the right leg and states that she needs back surgery.  She denies any exertional chest discomfort or shortness of breath with what she can do.  She denies any orthopnea, PND or edema.  Past Medical History:  Diagnosis Date  . Allergic rhinitis   . Barrett's esophagus   . Bipolar affective disorder ([place])   . Chronic respiratory failure ([place])   . Constipation, chronic   . Diabetes mellitus without complication ([place])    type 2   . Dry eye   . Gastroparesis   . GERD (gastroesophageal reflux disease)   .  History of bariatric surgery 03/2017  . Hyperlipidemia   . Hypertension   . Intractable chronic migraine without aura 06/04/2015  . Migraine headache   . Morbid obesity ([place])   . OSA (obstructive sleep apnea)    uses a cpap with 2 units o2   . Osteoarthritis    bilateral knee  . Sleep apnea    has c-pap  . Unspecified hypothyroidism   . Vitamin B 12 deficiency   . Vitamin D deficiency     Past Surgical History:  Procedure Laterality Date  . APPENDECTOMY  1978  . CHOLECYSTECTOMY  2005  . GASTRIC ROUX-EN-Y N/A 03/21/2017     Procedure: LAPAROSCOPIC ROUX-EN-Y GASTRIC, UPPER ENDO;  Surgeon: Greer Pickerel, MD;  Location: [facility];  Service: General;  Laterality: N/A;  . GASTROSTOMY N/A 06/19/2018   Procedure: LAPRASCOPIC INSERTION OF GASTROSTOMY TUBE;  Surgeon: Greer Pickerel, MD;  Location: [facility];  Service: General;  Laterality: N/A;  . HERNIA REPAIR    . HIATAL HERNIA REPAIR N/A 06/19/2018   Procedure: LAPAROSCOPIC REPAIR OF HIATAL HERNIA;  Surgeon: Greer Pickerel, MD;  Location: [facility];  Service: General;  Laterality: N/A;  . LAPAROSCOPY N/A 06/19/2018   Procedure: LAPAROSCOPY DIAGNOSTIC;  Surgeon: Greer Pickerel, MD;  Location: [facility];  Service: General;  Laterality: N/A;  . SHOULDER ARTHROSCOPY  7/12   left-dsc  . TONSILLECTOMY  at age 63  . TRIGGER FINGER RELEASE  12/20/2012   Procedure: RELEASE TRIGGER FINGER/A-1 PULLEY;  Surgeon: Tennis Must, MD;  Location: [facility];  Service: Orthopedics;  Laterality: Left;  LEFT TRIGGER THUMB RELEASE     Home Medications:  Prior to Admission medications   Medication Sig Start Date End Date Taking? Authorizing Provider  AIMOVIG 140 MG/ML SOAJ INJECT 140MG  (2ML) ONCE A MONTH AS DIRECTED Patient taking differently: Inject 140 mg into the muscle every 30 (thirty) days.  07/16/18  Yes Kathrynn Ducking, MD  diclofenac sodium (VOLTAREN) 1 % GEL Apply 2 g topically 2 (two) times daily as needed (JOINT PAIN). Use as directed for knee pain 10/02/15  Yes [provider]  divalproex (DEPAKOTE) 500 MG DR tablet TAKE 1 TABLET EVERY MORNING AND 2 TABLETS AT BEDTIME 04/24/18  Yes Millikan, Megan, NP  ezetimibe (ZETIA) 10 MG tablet TAKE 1 TABLET ONCE DAILY Patient taking differently: Take 10 mg by mouth at bedtime.  06/05/18  Yes Timmothy Euler, MD  fluticasone (FLONASE) 50 MCG/ACT nasal spray SPRAY 1 SPRAY IN EACH NOSTRIL TWICE DAILY. SHAKE GENTLY BEFORE EACH [place] E. Patient taking differently: Place 2 sprays into both nostrils 2 (two) times daily as needed for allergies.   08/01/17  Yes Timmothy Euler, MD  gabapentin (NEURONTIN) 300 MG capsule Take 2 capsules (600 mg total) 2 (two) times daily by mouth. 10/18/17  Yes Ward Givens, NP  levothyroxine (SYNTHROID, LEVOTHROID) 125 MCG tablet Take 125 mcg by mouth daily before breakfast.   Yes [provider]  loratadine (CLARITIN) 10 MG tablet Take 10 mg by mouth daily as needed for allergies.    Yes [provider]  Multiple Vitamin (MULTIVITAMIN) capsule Take 1 capsule by mouth daily.    Yes [provider]  nystatin (NYAMYC) powder APPLY TOPICALLY TWICE A DAY AS NEEDED Patient taking differently: Apply 1 g topically 2 (two) times daily as needed (yeast).  07/06/17  Yes Timmothy Euler, MD  ONE TOUCH ULTRA TEST test strip CHECK BLOOD SUGAR TWICE A DAY AS DIRECTED Patient taking differently:  1 each by Other route 2 (two) times daily as needed. CHECK BLOOD SUGAR TWICE A DAY AS DIRECTED 04/02/18  Yes Timmothy Euler, MD  pantoprazole (PROTONIX) 40 MG tablet Take 1 tablet by mouth daily.  12/16/17  Yes [provider]  PAZEO 0.7 % SOLN Place 1 drop into both eyes every morning. 06/10/16  Yes [provider]  polyethylene glycol (MIRALAX / GLYCOLAX) packet Take 17 g by mouth at bedtime as needed for mild constipation.    Yes [provider]  Probiotic Product (HEALTHY COLON PO) Take 1 capsule by mouth 2 (two) times daily.    Yes [provider]  RESTASIS MULTIDOSE 0.05 % ophthalmic emulsion Place 1 drop into both eyes 2 (two) times daily. 06/17/16  Yes [provider]  sertraline (ZOLOFT) 100 MG tablet TAKE (2) TABLETS DAILY Patient taking differently: Take 100 mg by mouth daily.  07/10/18  Yes Gottschalk, Leatrice Jewels M, DO  tiZANidine (ZANAFLEX) 2 MG tablet Take 1 tablet (2 mg total) by mouth 3 (three) times daily. Patient taking differently: Take 2 mg by mouth every 8 (eight) hours as needed for muscle spasms.  11/27/17  Yes Kathrynn Ducking, MD    traMADol (ULTRAM) 50 MG tablet Take 50 mg by mouth every 6 (six) hours.    Yes [provider]  ziprasidone (GEODON) 60 MG capsule TAKE 1 CAPSULE IN THE EVENING Patient taking differently: Take 60 mg by mouth every evening.  03/12/18  Yes Timmothy Euler, MD    Inpatient Medications: Scheduled Meds: . cefdinir  300 mg Oral Q12H  . cycloSPORINE  1 drop Both Eyes BID  . divalproex  500 mg Oral BID  . enoxaparin (LOVENOX) injection  40 mg Subcutaneous Q24H  . gabapentin  300 mg Oral BID  . insulin aspart  1-3 Units Subcutaneous Q4H  . levothyroxine  125 mcg Oral QAC breakfast  . olopatadine  1 drop Both Eyes q morning - 10a  . pantoprazole  40 mg Oral Daily  . potassium chloride  40 mEq Oral Once  . scopolamine  1 patch Transdermal Q72H  . sertraline  100 mg Oral Daily  . ziprasidone  60 mg Oral QPM   Continuous Infusions: . sodium chloride 10 mL/hr at 07/22/18 2142  . dextrose 5 % and 0.45% NaCl 40 mL/hr at 07/23/18 0600   PRN Meds: sodium chloride, acetaminophen, aspirin-acetaminophen-caffeine, dextrose, simethicone, traMADol  Allergies:    Allergies  Allergen Reactions  . Ketoconazole     REACTION: hives, swelling  . Pravachol [Pravastatin Sodium] Shortness Of Breath, Swelling and Anaphylaxis    Throat swelling  . Tape Dermatitis and Other (See Comments)  . Codeine     REACTION: increased BP  . Statins     REACTION: leg crapms    Social History:   Social History   Socioeconomic History  . Marital status: Single    Spouse name: Not on file  . Number of children: 0  . Years of education: HS  . Highest education level: 12th grade  Occupational History  . Occupation: disabled    Fish farm manager: UNEMPLOYED  Social Needs  . Financial resource strain: Not hard at all  . Food insecurity:    Worry: Never true    Inability: Never true  . Transportation needs:    Medical: No    Non-medical: No  Tobacco Use  . Smoking status: Never Smoker  . Smokeless  tobacco: Never Used  Substance and Sexual Activity  .  Alcohol use: No  . Drug use: No  . Sexual activity: Never    Birth control/protection: None  Lifestyle  . Physical activity:    Days per week: 0 days    Minutes per session: 0 min  . Stress: Not at all  Relationships  . Social connections:    Talks on phone: More than three times a week    Gets together: More than three times a week    Attends religious service: More than 4 times per year    Active member of club or organization: No    Attends meetings of clubs or organizations: Never    Relationship status: Never married  . Intimate partner violence:    Fear of current or ex partner: No    Emotionally abused: No    Physically abused: No    Forced sexual activity: No  Other Topics Concern  . Not on file  Social History Narrative   Patient is right handed.   Patient drinks 2 glasses of caffeine daily.    Family History:    Family History  Problem Relation Age of Onset  . Asthma Father   . Allergies Father   . Heart disease Father        enlarged heart  . Peripheral vascular disease Father   . Diabetes Father   . Hyperlipidemia Father   . Arthritis Father   . Asthma Sister   . Cancer Sister        colon at 108 yr old.  . Colon cancer Sister   . Allergies Mother   . Stroke Mother 61       with hemi paralysis  . Diabetes Mother   . Hyperlipidemia Mother   . Hypertension Mother   . GI problems Mother   . Arthritis Mother   . Allergies Brother   . Early death Brother 9       congenital abormality  . Allergies Sister   . Diabetes Sister   . Asthma Sister   . Colon polyps Sister   . Hyperlipidemia Sister   . GI problems Sister        gastroporesis   . Liver disease Sister        fatty liver  . Stroke Sister        intercrandial bleed  . Diabetes Brother   . Hypertension Brother   . Hyperlipidemia Brother   . Heart disease Paternal Aunt   . Migraines Neg Hx      ROS:  Please see the history of  present illness.   All other ROS reviewed and negative.     Physical Exam/Data:   Vitals:   07/23/18 0545 07/23/18 0600 07/23/18 0615 07/23/18 0630  BP: 127/76 115/80 119/77 119/78  Pulse: 60 (!) 54 (!) 54 (!) 56  Resp: 14 14 14 15   Temp:      TempSrc:      SpO2: 94% 98% 98% 99%  Weight:      Height:        Intake/Output Summary (Last 24 hours) at 07/23/2018 0923 Last data filed at 07/23/2018 0600 Gross per 24 hour  Intake 3326.48 ml  Output 1615 ml  Net 1711.48 ml   Filed Weights   07/20/18 1036  Weight: 56.7 kg   Body mass index is 21.46 kg/m.  General: Thin, gaunt appearing female HEENT: normal Neck: no JVD Vascular: No carotid bruits; no pulses 1+ bilaterally Cardiac:  normal S1, S2; RRR; no murmur  Lungs:  clear to auscultation bilaterally, no wheezing, rhonchi or rales.  Diminished breath sounds in the bases bilaterally Abd: soft, nontender, G-tube present Ext: no edema Musculoskeletal:  No deformities, BUE and BLE strength normal and equal Skin: warm and dry  Neuro:  CNs 2-12 intact, no focal abnormalities noted Psych:  Normal affect   EKG:  The EKG was personally reviewed and demonstrates:  Sinus bradycardia at 53 bpm with low voltage Telemetry:  Telemetry was personally reviewed and demonstrates: Sinus rhythm in the 70s with noted sinus bradycardia in the 50s and occasional dips into the upper 40s during the night  Relevant CV Studies:  Myocardial perfusion imaging 09/21/2016  The left ventricular ejection fraction is normal (55-65%).  Nuclear stress EF: 61%. No wall motion abnormalities  There was no ST segment deviation noted during stress.  Defect 1: There is a small defect of mild severity present in the apical anterior and apical septal location. Possibly breast attenuation artifact.  This is a low risk study. No ischemia identified.    Laboratory Data:  Chemistry Recent Labs  Lab 07/21/18 0429 07/22/18 0337 07/23/18 0354  NA 144 143  146*  K 4.5 3.5 3.6  CL 113* 112* 115*  CO2 24 27 28   GLUCOSE 90 120* 132*  BUN 21* 16 12  CREATININE 0.68 0.49 0.50  CALCIUM 7.1* 7.6* 7.3*  GFRNONAA >60 >60 >60  GFRAA >60 >60 >60  ANIONGAP 7 4* 3*    Recent Labs  Lab 07/20/18 1023 07/20/18 2258 07/21/18 0429  PROT 4.9* 4.9* 4.9*  ALBUMIN 2.8* 2.4* 2.4*  AST 333* 214* 169*  ALT 165* 158* 142*  ALKPHOS 41 37* 38  BILITOT 0.4 0.4 0.3   Hematology Recent Labs  Lab 07/21/18 0429 07/22/18 0337 07/23/18 0354  WBC 13.9* 7.1 7.2  RBC 3.58* 3.07* 3.09*  HGB 10.8* 9.4* 9.4*  HCT 33.4* 28.9* 29.3*  MCV 93.3 94.1 94.8  MCH 30.2 30.6 30.4  MCHC 32.3 32.5 32.1  RDW 13.5 13.8 13.7  PLT 170 109* 125*   Cardiac EnzymesNo results for input(s): TROPONINI in the last 168 hours.  Recent Labs  Lab 07/20/18 1058  TROPIPOC 0.01    BNPNo results for input(s): BNP, PROBNP in the last 168 hours.  DDimer No results for input(s): DDIMER in the last 168 hours.  Radiology/Studies:  Dg Abd 1 View  Result Date: 07/21/2018 CLINICAL DATA:  Acute onset of generalized abdominal pain. EXAM: ABDOMEN - 1 VIEW COMPARISON:  CT of the abdomen and pelvis from 06/30/2018 FINDINGS: The visualized bowel gas pattern is unremarkable. Scattered air and stool filled loops of colon are seen; no abnormal dilatation of small bowel loops is seen to suggest small bowel obstruction. No free intra-abdominal air is identified, though evaluation for free air is limited on a single supine view. The visualized osseous structures are within normal limits; the sacroiliac joints are unremarkable in appearance. Clips are noted within the right upper quadrant, reflecting prior cholecystectomy. IMPRESSION: Unremarkable bowel gas pattern; no free intra-abdominal air seen. Electronically Signed   By: Garald Balding M.D.   On: 07/21/2018 00:47   Ct Head Wo Contrast  Result Date: 07/21/2018 CLINICAL DATA:  Fall with head trauma. EXAM: CT HEAD WITHOUT CONTRAST CT CERVICAL SPINE  WITHOUT CONTRAST TECHNIQUE: Multidetector CT imaging of the head and cervical spine was performed following the standard protocol without intravenous contrast. Multiplanar CT image reconstructions of the cervical spine were also generated. COMPARISON:  None. FINDINGS: CT HEAD FINDINGS Brain: There  is no mass, hemorrhage or extra-axial collection. The size and configuration of the ventricles and extra-axial CSF spaces are normal. There is no acute or chronic infarction. The brain parenchyma is normal. Vascular: No abnormal hyperdensity of the major intracranial arteries or dural venous sinuses. No intracranial atherosclerosis. Skull: The visualized skull base, calvarium and extracranial soft tissues are normal. Sinuses/Orbits: No fluid levels or advanced mucosal thickening of the visualized paranasal sinuses. No mastoid or middle ear effusion. The orbits are normal. CT CERVICAL SPINE FINDINGS Alignment: No static subluxation. Facets are aligned. Occipital condyles are normally positioned. Skull base and vertebrae: No acute fracture. Soft tissues and spinal canal: No prevertebral fluid or swelling. No visible canal hematoma. Disc levels: No advanced spinal canal or neural foraminal stenosis. Upper chest: No pneumothorax, pulmonary nodule or pleural effusion. Other: Normal visualized paraspinal cervical soft tissues. IMPRESSION: Normal head and cervical spine. Electronically Signed   By: Ulyses Jarred M.D.   On: 07/21/2018 00:31   Ct Cervical Spine Wo Contrast  Result Date: 07/21/2018 CLINICAL DATA:  Fall with head trauma. EXAM: CT HEAD WITHOUT CONTRAST CT CERVICAL SPINE WITHOUT CONTRAST TECHNIQUE: Multidetector CT imaging of the head and cervical spine was performed following the standard protocol without intravenous contrast. Multiplanar CT image reconstructions of the cervical spine were also generated. COMPARISON:  None. FINDINGS: CT HEAD FINDINGS Brain: There is no mass, hemorrhage or extra-axial collection.  The size and configuration of the ventricles and extra-axial CSF spaces are normal. There is no acute or chronic infarction. The brain parenchyma is normal. Vascular: No abnormal hyperdensity of the major intracranial arteries or dural venous sinuses. No intracranial atherosclerosis. Skull: The visualized skull base, calvarium and extracranial soft tissues are normal. Sinuses/Orbits: No fluid levels or advanced mucosal thickening of the visualized paranasal sinuses. No mastoid or middle ear effusion. The orbits are normal. CT CERVICAL SPINE FINDINGS Alignment: No static subluxation. Facets are aligned. Occipital condyles are normally positioned. Skull base and vertebrae: No acute fracture. Soft tissues and spinal canal: No prevertebral fluid or swelling. No visible canal hematoma. Disc levels: No advanced spinal canal or neural foraminal stenosis. Upper chest: No pneumothorax, pulmonary nodule or pleural effusion. Other: Normal visualized paraspinal cervical soft tissues. IMPRESSION: Normal head and cervical spine. Electronically Signed   By: Ulyses Jarred M.D.   On: 07/21/2018 00:31   Dg Chest Port 1 View  Result Date: 07/22/2018 CLINICAL DATA:  Respiratory failure. EXAM: PORTABLE CHEST 1 VIEW COMPARISON:  07/20/2018. FINDINGS: Normal sized heart. Increased dense opacity at the right lung base. Minimal atelectasis at the left lung base. Thoracic spine degenerative changes. IMPRESSION: 1. Increased dense right basilar atelectasis or pneumonia. 2. Minimal left basilar atelectasis. Electronically Signed   By: Claudie Revering M.D.   On: 07/22/2018 07:39   Dg Chest Port 1 View  Result Date: 07/20/2018 CLINICAL DATA:  Increasing weakness.  Chronic respiratory failure. EXAM: PORTABLE CHEST 1 VIEW COMPARISON:  Earlier today. FINDINGS: Normal sized heart. Dense airspace opacity at the right lung base with a more consolidated appearance and some volume loss. Clear left lung. Thoracic spine degenerative changes.  IMPRESSION: Some interval volume loss associated with dense right basilar airspace opacity suspicious for pneumonia with superimposed atelectasis. See the previous report for follow-up recommendations. Electronically Signed   By: Claudie Revering M.D.   On: 07/20/2018 14:58   Dg Chest Port 1 View  Result Date: 07/20/2018 CLINICAL DATA:  Sepsis.  Weakness. EXAM: PORTABLE CHEST 1 VIEW COMPARISON:  Radiograph of March 17, 2017. FINDINGS: The heart size and mediastinal contours are within normal limits. No pneumothorax or pleural effusion is noted. Left lung is clear. Large right lower lobe opacity is noted concerning for pneumonia. The visualized skeletal structures are unremarkable. IMPRESSION: Large right lower lobe opacity consistent with pneumonia. Followup PA and lateral chest X-ray is recommended in 3-4 weeks following trial of antibiotic therapy to ensure resolution and exclude underlying malignancy. Electronically Signed   By: Marijo Conception, M.D.   On: 07/20/2018 11:41    Assessment and Plan:   Bradycardia -EKG shows sinus bradycardia at 53 bpm -Patient with known bradycardia in the past, 49 bpm at her cardiology appointment in 2017-she was on propranolol at that time.  Recent office visits this year show heart rates in the low 60s. -TSH was within normal range at 0.487.  She is continued on Synthroid -Heart rates on telemetry overnight were in the 50s with occasional dips into the upper 40s.  The patient denies any symptoms with this.  Today her heart rates are in the 70s. -This is likely a normal variant for her.  Her poor nutritional status may have some bearing.  -Morgan [name] is to see the patient  Hyperlipidemia -Unable to tolerate statins in the past due to myalgias.  On Zetia and fenofibrate.  -Most recent lipid panel 05/10/2018 showed an LDL of 100 and triglycerides much improved from the past at 93.  Obstructive sleep apnea -Prior treatment with CPAP.  Recent home sleep test was done  with discontinuation of her CPAP, probably related to her significant weight loss  Sepsis secondary to right lower lobe pneumonia -Presented with hypotension, leukocytosis, weakness and fall -Treatment with IV antibiotics per primary team -Patient also has a history of bariatric surgery and weight loss of 185 pounds.  She has had poor nutrition due to abdominal pain, lack of appetite and early satiety.  History of bipolar disorder and chronic headaches -Patient was on propranolol in the past for headache prophylaxis.  This was discontinued about 2 months ago and is now on Depakote   For questions or updates, please contact [place] Please consult [email or web address] for contact info under Cardiology/STEMI.   Signed, Daune Perch, NP  07/23/2018 9:23 AM

## 2018-07-23 NOTE — Progress Notes (Signed)
PULMONARY / CRITICAL CARE MEDICINE  Name: Morgan [name] MRN: [id number] DOB: Nov 26, 1966    ADMISSION DATE:  07/20/2018   REFERRING MD:  Dr. Zenia Resides, ER  CHIEF COMPLAINT:  Hypotension, RLL pneumonia   HISTORY OF PRESENT ILLNESS:   52 year old woman who lost 175 pounds after gastric bypass, admitted with hypotension after a fall at home and history of recurrent hypoglycemic episodes, she was diagnosed with CAP.    SUBJECTIVE: Bradycardia overnight, hypotensive overnight. Low k this morning. Coughing up productive sputum.   STUDIES:   CULTURES: Blood 8/09 >>ng Urine 8/09 >> Pneumococcal Ag 8/09 >>neg  ANTIBIOTICS: Vancomycin 8/09 >>  Zosyn 8/09 >>  Rocephin 8/09 >> 8/9 Zithromax 8/09 >> 8/9  SIGNIFICANT EVENTS: 8/09 Admit 8/12 hypotension, bradycardia overnight, low blood glucose   LINES/TUBES: PIV >> PEG placed July 2019  VITAL SIGNS: BP 119/78   Pulse (!) 56   Temp (!) 97.3 F (36.3 C) (Axillary)   Resp 15   Ht 5\' 4"  (1.626 m)   Wt 56.7 kg   LMP 03/17/2017   SpO2 99%   BMI 21.46 kg/m   INTAKE / OUTPUT: I/O last 3 completed shifts: In: 5658.1 [P.O.:720; I.V.:2682.7; IV Piggyback:2255.4] Out: 2005 [Urine:2005]  PHYSICAL EXAMINATION:  General appearance: 52 y.o., female, NAD, conversant  Eyes: anicteric sclerae, moist conjunctivae; no lid-lag; PERRL, tracking appropriately HENT: trauma/scrap on face from fall; oropharynx, MMM, no mucosal ulcerations Neck: Trachea midline; FROM, supple, lymphadenopathy, no JVD Lungs: Absent breath sounds in the right base, normal respiratory effort and no intercostal retractions CV: RRR, S1, S2, no MRGs  Abdomen: Soft, non-tender; non-distended, BS present, PEG in LUQ Extremities: No peripheral edema or radial and DP pulses present bilaterally  Skin: Normal temperature, turgor and texture; no rash Psych: Appropriate mood and affect Neuro: Alert and oriented to person and place, no focal deficit   LABS:  BMET Recent  Labs  Lab 07/21/18 0429 07/22/18 0337 07/23/18 0354  NA 144 143 146*  K 4.5 3.5 3.6  CL 113* 112* 115*  CO2 24 27 28   BUN 21* 16 12  CREATININE 0.68 0.49 0.50  GLUCOSE 90 120* 132*    Electrolytes Recent Labs  Lab 07/20/18 2258 07/21/18 0429 07/22/18 0337 07/23/18 0354  CALCIUM 7.2* 7.1* 7.6* 7.3*  MG 1.8 2.4 2.1  --   PHOS 3.2 2.8 2.0*  --     CBC Recent Labs  Lab 07/21/18 0429 07/22/18 0337 07/23/18 0354  WBC 13.9* 7.1 7.2  HGB 10.8* 9.4* 9.4*  HCT 33.4* 28.9* 29.3*  PLT 170 109* 125*    Coag's Recent Labs  Lab 07/20/18 1023  INR 1.17    Sepsis Markers Recent Labs  Lab 07/20/18 2258 07/21/18 0139 07/21/18 0429 07/22/18 0337  LATICACIDVEN 1.1 2.3* 0.9  --   PROCALCITON 37.17  --  29.88 16.44    ABG Recent Labs  Lab 07/20/18 2343  PHART 7.388  PCO2ART 42.1  PO2ART 74.2*    Liver Enzymes Recent Labs  Lab 07/20/18 1023 07/20/18 2258 07/21/18 0429  AST 333* 214* 169*  ALT 165* 158* 142*  ALKPHOS 41 37* 38  BILITOT 0.4 0.4 0.3  ALBUMIN 2.8* 2.4* 2.4*    Cardiac Enzymes No results for input(s): TROPONINI, PROBNP in the last 168 hours.  Glucose Recent Labs  Lab 07/23/18 0110 07/23/18 0139 07/23/18 0300 07/23/18 0359 07/23/18 0754 07/23/18 0814  GLUCAP 63* 125* 204* 126* 61* 67*    Imaging  CXR 8/11 - reviewed with worsening RLL  consolidation and associated atelectasis  The patient's images have been independently reviewed by me.    DISCUSSION: 52 yo female with septic shock, now resolved, s/p fall, recurrent hypoglycemic episodes, found to have a RLL pneumonia.   ASSESSMENT / PLAN:  Septic Shock, now resolved, BP stable this morning RLL community acquired pneumonia, infiltrate with associated RLL atelectasis has worsened on imaging with shift of right mainstem.  - Respiratory cultures with GPC pairs,  Acute hypoxic respiratory failure secondary to the above, resolved now on room air  Hx of Bipolar disorder, chronic  headaches. 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, now with recurrent episodes of hypoglycemia, one must consider hyperinsulinemia syndrome associated with post bypass  Protein calorie malnutrition  - Will continue current abx regimen, she was de-escalated to oral cefdinir by primary  - With the worsening image would obtain a CT Chest to further evaluation (I have ordered this)   - bedside US of the right thorax reveals minimal pleural fluid and only areas of consolidated lung - Would consider sending c-peptide/ serum insulin levels with consult to endocrinology  - continue the D5 for now, however, oral carbohydrate loads can make episodes of hypoglycemia worse.  - would recommend dietary consultation, we may need to use her PEG tube for nutrition   DVT prophylaxis - Lovenox  Garner Nash, DO  Pulmonary Critical Care 07/23/2018 8:36 AM  Personal pager: [phone] If unanswered, please page CCM On-call: [phone]

## 2018-07-23 NOTE — Evaluation (Signed)
Physical Therapy Evaluation Patient Details Name: Morgan [name] MRN: [id number] DOB: November 19, 1966 Today's Date: 07/23/2018   History of Present Illness  52 yo female admitted after falling at home. Diagnosis: Pna, sepsis, hypoglycemia, hypotension  Clinical Impression  On eval, pt was Min guard assist for mobility. She walked ~375 feet around the ICU. Pt denied lightheadedness. She is mildly unsteady at times. Discussed d/c plan-she plans to return home alone. She stated she has a family member coming to stay with her (at some point). She politely declines HHPT f/u. Will continue to follow and progress activity as tolerated.     Follow Up Recommendations Supervision for mobility/OOB(pt declines HHPT f/u)    Equipment Recommendations  None recommended by PT    Recommendations for Other Services       Precautions / Restrictions Precautions Precautions: Fall Restrictions Weight Bearing Restrictions: No      Mobility  Bed Mobility               General bed mobility comments: oob in recliner  Transfers Overall transfer level: Needs assistance   Transfers: Sit to/from Stand Sit to Stand: Min guard         General transfer comment: very close guarding for safety. Mildly unsteady. Pt denied lighteadedness  Ambulation/Gait Ambulation/Gait assistance: Min assist;Min guard assist Gait Distance (Feet): 375 Feet Assistive device: IV Pole;None Gait Pattern/deviations: Step-through pattern;Decreased stride length;Drifts right/left     General Gait Details: Mildly unsteady. Walked 1/2 distance with 1 hand support on Iv pole. 2nd 1/2 of distance without UE support. Very close guarding. Small amount of assist initially. Pt tolerated distance well. She denied lightheadedness.  Stairs            Wheelchair Mobility    Modified Rankin (Stroke Patients Only)       Balance Overall balance assessment: History of Falls;Mild deficits observed, not formally tested                                            Pertinent Vitals/Pain Pain Assessment: No/denies pain    Home Living Family/patient expects to be discharged to:: Private residence Living Arrangements: Alone Available Help at Discharge: Family;Available PRN/intermittently Type of Home: Mobile home Home Access: Ramped entrance     Home Layout: One level Home Equipment: [place] - 2 wheels;Cane - single point;Wheelchair - [employer];Shower seat      Prior Function Level of Independence: Independent               Hand Dominance        Extremity/Trunk Assessment   Upper Extremity Assessment Upper Extremity Assessment: Overall WFL for tasks assessed    Lower Extremity Assessment Lower Extremity Assessment: Generalized weakness    Cervical / Trunk Assessment Cervical / Trunk Assessment: Normal  Communication   Communication: No difficulties  Cognition Arousal/Alertness: Awake/alert Behavior During Therapy: WFL for tasks assessed/performed Overall Cognitive Status: Within Functional Limits for tasks assessed                                        General Comments      Exercises     Assessment/Plan    PT Assessment Patient needs continued PT services  PT Problem List Decreased mobility;Decreased strength;Decreased balance  PT Treatment Interventions Gait training;Functional mobility training;Therapeutic activities;Balance training;Patient/family education;Therapeutic exercise    PT Goals (Current goals can be found in the Care Plan section)  Acute Rehab PT Goals Patient Stated Goal: home PT Goal Formulation: With patient Time For Goal Achievement: 08/06/18 Potential to Achieve Goals: Good    Frequency Min 3X/week   Barriers to discharge        Co-evaluation               AM-PAC PT "6 Clicks" Daily Activity  Outcome Measure Difficulty turning over in bed (including adjusting bedclothes, sheets and  blankets)?: A Little Difficulty moving from lying on back to sitting on the side of the bed? : A Little Difficulty sitting down on and standing up from a chair with arms (e.g., wheelchair, bedside commode, etc,.)?: A Little Help needed moving to and from a bed to chair (including a wheelchair)?: A Little Help needed walking in hospital room?: A Little Help needed climbing 3-5 steps with a railing? : A Little 6 Click Score: 18    End of Session Equipment Utilized During Treatment: Gait belt Activity Tolerance: Patient tolerated treatment well Patient left: in chair;with call bell/phone within reach;with chair alarm set   PT Visit Diagnosis: Muscle weakness (generalized) (M62.81);Unsteadiness on feet (R26.81);History of falling (Z91.81)    Time: 1552-0802 PT Time Calculation (min) (ACUTE ONLY): 12 min   Charges:   PT Evaluation $PT Eval Moderate Complexity: 1 Mod            Weston Anna, MPT Pager: [phone]

## 2018-07-23 NOTE — Progress Notes (Signed)
  Echocardiogram 2D Echocardiogram has been performed.  Merrie Roof F 07/23/2018, 3:54 PM

## 2018-07-24 ENCOUNTER — Telehealth: Payer: Self-pay | Admitting: Family Medicine

## 2018-07-24 DIAGNOSIS — Z9884 Bariatric surgery status: Secondary | ICD-10-CM

## 2018-07-24 DIAGNOSIS — J155 Pneumonia due to Escherichia coli: Secondary | ICD-10-CM

## 2018-07-24 DIAGNOSIS — E119 Type 2 diabetes mellitus without complications: Secondary | ICD-10-CM

## 2018-07-24 LAB — GLUCOSE, CAPILLARY
GLUCOSE-CAPILLARY: 94 mg/dL (ref 70–99)
Glucose-Capillary: 66 mg/dL — ABNORMAL LOW (ref 70–99)
Glucose-Capillary: 99 mg/dL (ref 70–99)
Glucose-Capillary: 107 mg/dL — ABNORMAL HIGH (ref 70–99)

## 2018-07-24 LAB — COMPREHENSIVE METABOLIC PANEL
ALBUMIN: 1.9 g/dL — AB (ref 3.5–5.0)
ALK PHOS: 34 U/L — AB (ref 38–126)
ALT: 44 U/L (ref 0–44)
ANION GAP: 6 (ref 5–15)
AST: 26 U/L (ref 15–41)
BUN: 9 mg/dL (ref 6–20)
CALCIUM: 7.6 mg/dL — AB (ref 8.9–10.3)
CHLORIDE: 110 mmol/L (ref 98–111)
CO2: 28 mmol/L (ref 22–32)
Creatinine, Ser: 0.5 mg/dL (ref 0.44–1.00)
GFR calc Af Amer: >60 mL/min (ref >60)
GFR calc non Af Amer: >60 mL/min (ref >60)
GLUCOSE: 77 mg/dL (ref 70–99)
POTASSIUM: 4.5 mmol/L (ref 3.5–5.1)
SODIUM: 144 mmol/L (ref 135–145)
Total Bilirubin: 0.3 mg/dL (ref 0.3–1.2)
Total Protein: 4 g/dL — ABNORMAL LOW (ref 6.5–8.1)

## 2018-07-24 LAB — CULTURE, RESPIRATORY W GRAM STAIN

## 2018-07-24 LAB — CULTURE, RESPIRATORY

## 2018-07-24 LAB — CBC
HCT: 30.5 % — ABNORMAL LOW (ref 36.0–46.0)
HEMOGLOBIN: 9.9 g/dL — AB (ref 12.0–15.0)
MCH: 30.4 pg (ref 26.0–34.0)
MCHC: 32.5 g/dL (ref 30.0–36.0)
MCV: 93.6 fL (ref 78.0–100.0)
Platelets: 124 10*3/uL — ABNORMAL LOW (ref 150–400)
RBC: 3.26 MIL/uL — AB (ref 3.87–5.11)
RDW: 13.4 % (ref 11.5–15.5)
WBC: 4.5 10*3/uL (ref 4.0–10.5)

## 2018-07-24 MED ORDER — GLUCOSE BLOOD VI STRP: 1.0000 | ORAL_STRIP | Freq: Two times a day (BID) | Status: DC | PRN

## 2018-07-24 MED ORDER — TIZANIDINE HCL 2 MG PO TABS: 2.0000 mg | ORAL_TABLET | Freq: Three times a day (TID) | ORAL | Status: DC | PRN

## 2018-07-24 MED ORDER — FLUTICASONE PROPIONATE 50 MCG/ACT NA SUSP: 2.0000 | Freq: Two times a day (BID) | NASAL | Status: DC | PRN

## 2018-07-24 MED ORDER — CEFDINIR 300 MG PO CAPS: 300.0000 mg | ORAL_CAPSULE | Freq: Two times a day (BID) | ORAL | 0 refills | Status: DC

## 2018-07-24 MED ORDER — EZETIMIBE 10 MG PO TABS: 10.0000 mg | ORAL_TABLET | Freq: Every day | ORAL | Status: DC

## 2018-07-24 NOTE — Discharge Summary (Signed)
Physician Discharge Summary  NORA SABEY OHY:[id number] DOB: 09-12-1966 DOA: 07/20/2018  PCP: Janora Norlander, DO  Admit date: 07/20/2018 Discharge date: 07/24/2018  Admitted From: Home Disposition: Home  Recommendations for Outpatient Follow-up:  1. Follow up with PCP in 1-2 weeks.  Patient might need repeat chest x-ray in 4 to 6 weeks 2. Outpatient evaluation by endocrinology in 1 to 2 weeks   Home Health: No Equipment/Devices: None  Discharge Condition: Stable CODE STATUS: Full Diet recommendation: Heart Healthy / Carb Modified   Brief/Interim Summary: 52 year old female with history of allergic rhinitis, Barrett's esophagus, bipolar disorder, diabetes mellitus, gastroparesis, GERD, hyperlipidemia, hypertension, migraine, sleep apnea, history of bariatric surgery presented on 07/20/2018 with dizziness and fall at home.  She was initially admitted to ICU under critical care service with shock secondary to right-sided pneumonia and started on antibiotics.  Her condition gradually improved and she was transferred to hospitalist service.  Cardiology was also consulted for bradycardia and cardiology recommended to avoid nodal agents.  Cultures have been negative so far.  Her respiratory status has much improved.  She will be discharged home on oral antibiotics.  Discharge Diagnoses:  Active Problems:   CAP (community acquired pneumonia)   Atelectasis   Hypoglycemia   Hypotension   Bradycardia  Septic shock secondary to pneumonia -Shock has resolved.  Currently hemodynamically stable and off Levophed -Antibiotic plan as below  Right lower lobe/lobar bacterial pneumonia -Initially started on Rocephin and doxycycline however transition to vancomycin and Zosyn.  Vancomycin was discontinued and Zosyn was transitioned to cefdinir.  Discharge home on oral cefdinir for 3 more days.  Respiratory status stable.  Blood cultures negative so far -PCCM evaluation appreciated.  Patient will  need outpatient chest x-ray in 4 to 6 weeks time  AKI -Secondary to prerenal azotemia and hypovolemia.  Resolved  Sinus bradycardia -TSH within normal limits -Cardiology evaluation appreciated.  No further work-up needed as per cardiology  Hypothyroidism -Continue Synthroid  History of bipolar disorder, chronic headaches -Continue Depakote, Neurontin, Zoloft, tramadol, Geodon, Zanaflex.  Outpatient follow-up with psychiatry/PCP  Leukopenia, thrombocytopenia -Likely secondary to sepsis -White count has normalized.  Platelets still on the lower side, no signs of bleeding.  Outpatient follow-up  Diabetes mellitus type II with hypoglycemia -Patient has had episodes of hypoglycemia.  Outpatient follow-up.  Might need endocrinology evaluation  Hypokalemia -Resolved  Fall -Secondary to hypotension -CT head and C-spine unremarkable -Currently ambulating well.  Discharge Instructions  Discharge Instructions    Ambulatory referral to Endocrinology   Complete by:  As directed    Call MD for:  difficulty breathing, headache or visual disturbances   Complete by:  As directed    Call MD for:  extreme fatigue   Complete by:  As directed    Call MD for:  hives   Complete by:  As directed    Call MD for:  persistant dizziness or light-headedness   Complete by:  As directed    Call MD for:  persistant nausea and vomiting   Complete by:  As directed    Call MD for:  severe uncontrolled pain   Complete by:  As directed    Call MD for:  temperature >100.4   Complete by:  As directed    Diet - low sodium heart healthy   Complete by:  As directed    Diet Carb Modified   Complete by:  As directed    Increase activity slowly   Complete by:  As directed  Allergies as of 07/24/2018      Reactions   Ketoconazole    REACTION: hives, swelling   Pravachol [pravastatin Sodium] Shortness Of Breath, Swelling, Anaphylaxis   Throat swelling   Tape Dermatitis, Other (See Comments)    Codeine    REACTION: increased BP   Statins    REACTION: leg crapms      Medication List    TAKE these medications   AIMOVIG 140 MG/ML Soaj Generic drug:  Erenumab-aooe INJECT 140MG  (2ML) ONCE A MONTH AS DIRECTED What changed:  See the new instructions.   cefdinir 300 MG capsule Commonly known as:  OMNICEF Take 1 capsule (300 mg total) by mouth every 12 (twelve) hours for 3 days.   diclofenac sodium 1 % Gel Commonly known as:  VOLTAREN Apply 2 g topically 2 (two) times daily as needed (JOINT PAIN). Use as directed for knee pain   divalproex 500 MG DR tablet Commonly known as:  DEPAKOTE TAKE 1 TABLET EVERY MORNING AND 2 TABLETS AT BEDTIME   ezetimibe 10 MG tablet Commonly known as:  ZETIA Take 1 tablet (10 mg total) by mouth at bedtime.   fluticasone 50 MCG/ACT nasal spray Commonly known as:  FLONASE Place 2 sprays into both nostrils 2 (two) times daily as needed for allergies. What changed:  See the new instructions.   gabapentin 300 MG capsule Commonly known as:  NEURONTIN Take 2 capsules (600 mg total) 2 (two) times daily by mouth.   glucose blood test strip 1 each by Other route 2 (two) times daily as needed. CHECK BLOOD SUGAR TWICE A DAY AS DIRECTED   HEALTHY COLON PO Take 1 capsule by mouth 2 (two) times daily.   levothyroxine 125 MCG tablet Commonly known as:  SYNTHROID, LEVOTHROID Take 125 mcg by mouth daily before breakfast.   loratadine 10 MG tablet Commonly known as:  CLARITIN Take 10 mg by mouth daily as needed for allergies.   multivitamin capsule Take 1 capsule by mouth daily.   nystatin powder Commonly known as:  MYCOSTATIN/NYSTOP APPLY TOPICALLY TWICE A DAY AS NEEDED What changed:    how much to take  how to take this  when to take this  reasons to take this  additional instructions   pantoprazole 40 MG tablet Commonly known as:  PROTONIX Take 1 tablet by mouth daily.   PAZEO 0.7 % Soln Generic drug:  Olopatadine HCl Place 1  drop into both eyes every morning.   polyethylene glycol packet Commonly known as:  MIRALAX / GLYCOLAX Take 17 g by mouth at bedtime as needed for mild constipation.   RESTASIS MULTIDOSE 0.05 % ophthalmic emulsion Generic drug:  cycloSPORINE Place 1 drop into both eyes 2 (two) times daily.   sertraline 100 MG tablet Commonly known as:  ZOLOFT TAKE (2) TABLETS DAILY What changed:  See the new instructions.   tiZANidine 2 MG tablet Commonly known as:  ZANAFLEX Take 1 tablet (2 mg total) by mouth every 8 (eight) hours as needed for muscle spasms.   traMADol 50 MG tablet Commonly known as:  ULTRAM Take 50 mg by mouth every 6 (six) hours.   ziprasidone 60 MG capsule Commonly known as:  GEODON TAKE 1 CAPSULE IN THE EVENING What changed:  See the new instructions.      Follow-up Information    Skeet Latch, MD Follow up.   Specialty:  Cardiology Why:  Cardiology hospital follow-up on September 9 at 8:40 AM.  Please arrive 15 minutes early for  check-in. Contact information: [address] [place] [address] [place], [place], DO. Schedule an appointment as soon as possible for a visit in 1 week(s).   Specialty:  Family Medicine Contact information: [address] [place] [address] [phone]          Allergies  Allergen Reactions  . Ketoconazole     REACTION: hives, swelling  . Pravachol [Pravastatin Sodium] Shortness Of Breath, Swelling and Anaphylaxis    Throat swelling  . Tape Dermatitis and Other (See Comments)  . Codeine     REACTION: increased BP  . Statins     REACTION: leg crapms    Consultations:  PCCM/cardiology   Procedures/Studies: Dg Abd 1 View  Result Date: 07/21/2018 CLINICAL DATA:  Acute onset of generalized abdominal pain. EXAM: ABDOMEN - 1 VIEW COMPARISON:  CT of the abdomen and pelvis from 06/30/2018 FINDINGS: The visualized bowel gas pattern is unremarkable. Scattered air and stool  filled loops of colon are seen; no abnormal dilatation of small bowel loops is seen to suggest small bowel obstruction. No free intra-abdominal air is identified, though evaluation for free air is limited on a single supine view. The visualized osseous structures are within normal limits; the sacroiliac joints are unremarkable in appearance. Clips are noted within the right upper quadrant, reflecting prior cholecystectomy. IMPRESSION: Unremarkable bowel gas pattern; no free intra-abdominal air seen. Electronically Signed   By: Garald Balding M.D.   On: 07/21/2018 00:47   Ct Head Wo Contrast  Result Date: 07/21/2018 CLINICAL DATA:  Fall with head trauma. EXAM: CT HEAD WITHOUT CONTRAST CT CERVICAL SPINE WITHOUT CONTRAST TECHNIQUE: Multidetector CT imaging of the head and cervical spine was performed following the standard protocol without intravenous contrast. Multiplanar CT image reconstructions of the cervical spine were also generated. COMPARISON:  None. FINDINGS: CT HEAD FINDINGS Brain: There is no mass, hemorrhage or extra-axial collection. The size and configuration of the ventricles and extra-axial CSF spaces are normal. There is no acute or chronic infarction. The brain parenchyma is normal. Vascular: No abnormal hyperdensity of the major intracranial arteries or dural venous sinuses. No intracranial atherosclerosis. Skull: The visualized skull base, calvarium and extracranial soft tissues are normal. Sinuses/Orbits: No fluid levels or advanced mucosal thickening of the visualized paranasal sinuses. No mastoid or middle ear effusion. The orbits are normal. CT CERVICAL SPINE FINDINGS Alignment: No static subluxation. Facets are aligned. Occipital condyles are normally positioned. Skull base and vertebrae: No acute fracture. Soft tissues and spinal canal: No prevertebral fluid or swelling. No visible canal hematoma. Disc levels: No advanced spinal canal or neural foraminal stenosis. Upper chest: No  pneumothorax, pulmonary nodule or pleural effusion. Other: Normal visualized paraspinal cervical soft tissues. IMPRESSION: Normal head and cervical spine. Electronically Signed   By: Ulyses Jarred M.D.   On: 07/21/2018 00:31   Ct Chest W Contrast  Result Date: 07/23/2018 CLINICAL DATA:  Pneumonia. EXAM: CT CHEST WITH CONTRAST TECHNIQUE: Multidetector CT imaging of the chest was performed during intravenous contrast administration. CONTRAST:  1mL OMNIPAQUE IOHEXOL 300 MG/ML  SOLN COMPARISON:  Radiograph of July 22, 2018. CT scan of September 09, 2010. FINDINGS: Cardiovascular: No significant vascular findings. Normal heart size. No pericardial effusion. Mediastinum/Nodes: No enlarged mediastinal, hilar, or axillary lymph nodes. Thyroid gland, trachea, and esophagus demonstrate no significant findings. Lungs/Pleura: No pneumothorax is noted. Mild left pleural effusion is noted with adjacent subsegmental atelectasis. Right lower lobe airspace opacity is noted  most concerning for pneumonia with small associated pleural effusion. Mild right upper lobe infiltrate is noted as well. Upper Abdomen: No acute abnormality. Musculoskeletal: No chest wall abnormality. No acute or significant osseous findings. IMPRESSION: Right lower lobe airspace opacity is noted most consistent with pneumonia with small associated right pleural effusion. Smaller right upper lobe infiltrate is noted as well. Mild left pleural effusion is noted with adjacent subsegmental atelectasis. Electronically Signed   By: Marijo Conception, M.D.   On: 07/23/2018 14:33   Ct Cervical Spine Wo Contrast  Result Date: 07/21/2018 CLINICAL DATA:  Fall with head trauma. EXAM: CT HEAD WITHOUT CONTRAST CT CERVICAL SPINE WITHOUT CONTRAST TECHNIQUE: Multidetector CT imaging of the head and cervical spine was performed following the standard protocol without intravenous contrast. Multiplanar CT image reconstructions of the cervical spine were also generated.  COMPARISON:  None. FINDINGS: CT HEAD FINDINGS Brain: There is no mass, hemorrhage or extra-axial collection. The size and configuration of the ventricles and extra-axial CSF spaces are normal. There is no acute or chronic infarction. The brain parenchyma is normal. Vascular: No abnormal hyperdensity of the major intracranial arteries or dural venous sinuses. No intracranial atherosclerosis. Skull: The visualized skull base, calvarium and extracranial soft tissues are normal. Sinuses/Orbits: No fluid levels or advanced mucosal thickening of the visualized paranasal sinuses. No mastoid or middle ear effusion. The orbits are normal. CT CERVICAL SPINE FINDINGS Alignment: No static subluxation. Facets are aligned. Occipital condyles are normally positioned. Skull base and vertebrae: No acute fracture. Soft tissues and spinal canal: No prevertebral fluid or swelling. No visible canal hematoma. Disc levels: No advanced spinal canal or neural foraminal stenosis. Upper chest: No pneumothorax, pulmonary nodule or pleural effusion. Other: Normal visualized paraspinal cervical soft tissues. IMPRESSION: Normal head and cervical spine. Electronically Signed   By: Ulyses Jarred M.D.   On: 07/21/2018 00:31   Dg Chest Port 1 View  Result Date: 07/22/2018 CLINICAL DATA:  Respiratory failure. EXAM: PORTABLE CHEST 1 VIEW COMPARISON:  07/20/2018. FINDINGS: Normal sized heart. Increased dense opacity at the right lung base. Minimal atelectasis at the left lung base. Thoracic spine degenerative changes. IMPRESSION: 1. Increased dense right basilar atelectasis or pneumonia. 2. Minimal left basilar atelectasis. Electronically Signed   By: Claudie Revering M.D.   On: 07/22/2018 07:39   Dg Chest Port 1 View  Result Date: 07/20/2018 CLINICAL DATA:  Increasing weakness.  Chronic respiratory failure. EXAM: PORTABLE CHEST 1 VIEW COMPARISON:  Earlier today. FINDINGS: Normal sized heart. Dense airspace opacity at the right lung base with a more  consolidated appearance and some volume loss. Clear left lung. Thoracic spine degenerative changes. IMPRESSION: Some interval volume loss associated with dense right basilar airspace opacity suspicious for pneumonia with superimposed atelectasis. See the previous report for follow-up recommendations. Electronically Signed   By: Claudie Revering M.D.   On: 07/20/2018 14:58   Dg Chest Port 1 View  Result Date: 07/20/2018 CLINICAL DATA:  Sepsis.  Weakness. EXAM: PORTABLE CHEST 1 VIEW COMPARISON:  Radiograph of March 17, 2017. FINDINGS: The heart size and mediastinal contours are within normal limits. No pneumothorax or pleural effusion is noted. Left lung is clear. Large right lower lobe opacity is noted concerning for pneumonia. The visualized skeletal structures are unremarkable. IMPRESSION: Large right lower lobe opacity consistent with pneumonia. Followup PA and lateral chest X-ray is recommended in 3-4 weeks following trial of antibiotic therapy to ensure resolution and exclude underlying malignancy. Electronically Signed   By: Marijo Conception,  M.D.   On: 07/20/2018 11:41   Echo -Study Conclusions  - Left ventricle: The cavity size was normal. Wall thickness was   normal. Systolic function was normal. The estimated ejection   fraction was in the range of 55% to 60%. Doppler parameters are   consistent with abnormal left ventricular relaxation (grade 1   diastolic dysfunction). - Left atrium: The atrium was mildly dilated.   Subjective: Patient seen and examined at bedside.  She denies any overnight fever, nausea and vomiting.  Feels better and wants to go home.  Discharge Exam: Vitals:   07/24/18 0700 07/24/18 0800  BP: 113/73 104/60  Pulse: (!) 58 62  Resp: 14 16  Temp:    SpO2: 95% 96%   Vitals:   07/24/18 0600 07/24/18 0630 07/24/18 0700 07/24/18 0800  BP: 102/62 (!) 94/53 113/73 104/60  Pulse: (!) 51 (!) 55 (!) 58 62  Resp: 11 13 14 16   Temp:      TempSrc:      SpO2: 95% 94% 95%  96%  Weight:      Height:        General: Pt is alert, awake, not in acute distress Cardiovascular: rate controlled, S1/S2 + Respiratory: bilateral decreased breath sounds at bases Abdominal: Soft, NT, ND, bowel sounds + Extremities: no edema, no cyanosis    The results of significant diagnostics from this hospitalization (including imaging, microbiology, ancillary and laboratory) are listed below for reference.     Microbiology: Recent Results (from the past 240 hour(s))  Culture, blood (Routine x 2)     Status: None (Preliminary result)   Collection Time: 07/20/18 10:23 AM  Result Value Ref Range Status   Specimen Description   Final    BLOOD RIGHT FOREARM Performed at [facility] [address], [place], [place] [address]    Special Requests   Final    BOTTLES DRAWN AEROBIC AND ANAEROBIC Blood Culture adequate volume Performed at [facility] [address], [place], [place] [address]    Culture   Final    NO GROWTH 3 DAYS Performed at [facility], [place] [address], [place], [place] [address]    Report Status PENDING  Incomplete  Culture, blood (Routine x 2)     Status: None (Preliminary result)   Collection Time: 07/20/18 10:57 AM  Result Value Ref Range Status   Specimen Description   Final    BLOOD LEFT FOREARM Performed at [facility] [address], [place], [place] [address]    Special Requests   Final    BOTTLES DRAWN AEROBIC AND ANAEROBIC Blood Culture results may not be optimal due to an inadequate volume of blood received in culture bottles Performed at [facility] [address] [place], [place] [address]    Culture   Final    NO GROWTH 3 DAYS Performed at [facility], [place] [address], [place], [place] [address]    Report Status PENDING  Incomplete  Urine culture     Status: None   Collection Time: 07/20/18 11:43 AM  Result Value Ref Range Status   Specimen  Description   Final    URINE, CATHETERIZED Performed at [facility] [address] [place], [place] [address]    Special Requests   Final    NONE Performed at [facility], [place] [address] [place], [place] [address]    Culture   Final  NO GROWTH Performed at [facility], [place] [address], [place], [place] [address]    Report Status 07/21/2018 FINAL  Final  MRSA PCR Screening     Status: None   Collection Time: 07/20/18  5:48 PM  Result Value Ref Range Status   MRSA by PCR NEGATIVE NEGATIVE Final    Comment:        The GeneXpert MRSA Assay (FDA approved for NASAL specimens only), is one component of a comprehensive MRSA colonization surveillance program. It is not intended to diagnose MRSA infection nor to guide or monitor treatment for MRSA infections. Performed at [facility], [place] [address], [place], [place] [address]   Expectorated sputum assessment w rflx to resp cult     Status: None   Collection Time: 07/20/18 11:41 PM  Result Value Ref Range Status   Specimen Description SPUTUM  Final   Special Requests NONE  Final   Sputum evaluation   Final    THIS SPECIMEN IS ACCEPTABLE FOR SPUTUM CULTURE Performed at [facility], [place] [address] [place], [place] [address]    Report Status 07/22/2018 FINAL  Final  Culture, respiratory     Status: None   Collection Time: 07/20/18 11:41 PM  Result Value Ref Range Status   Specimen Description   Final    SPUTUM Performed at [facility] [address] [place], [place] [address]    Special Requests   Final    NONE Reflexed from [id number] Performed at [facility] [address] [place], [place] [address]    Gram Stain   Final    MODERATE WBC PRESENT,BOTH PMN AND MONONUCLEAR RARE GRAM POSITIVE COCCI IN PAIRS RARE GRAM POSITIVE RODS Performed at [facility], [place] [address] [place], [place]  [address]    Culture FEW ESCHERICHIA COLI  Final   Report Status 07/24/2018 FINAL  Final   Organism ID, Bacteria ESCHERICHIA COLI  Final      Susceptibility   Escherichia coli - MIC*    AMPICILLIN 4 SENSITIVE Sensitive     CEFAZOLIN <=4 SENSITIVE Sensitive     CEFEPIME <=1 SENSITIVE Sensitive     CEFTAZIDIME <=1 SENSITIVE Sensitive     CEFTRIAXONE <=1 SENSITIVE Sensitive     CIPROFLOXACIN <=0.25 SENSITIVE Sensitive     GENTAMICIN <=1 SENSITIVE Sensitive     IMIPENEM <=0.25 SENSITIVE Sensitive     TRIMETH/SULFA <=20 SENSITIVE Sensitive     AMPICILLIN/SULBACTAM <=2 SENSITIVE Sensitive     PIP/TAZO <=4 SENSITIVE Sensitive     Extended ESBL NEGATIVE Sensitive     * FEW ESCHERICHIA COLI     Labs: BNP (last 3 results) No results for input(s): BNP in the last 8760 hours. Basic Metabolic Panel: Recent Labs  Lab 07/20/18 2258 07/21/18 0429 07/22/18 0337 07/23/18 0354 07/24/18 0330  NA 145 144 143 146* 144  K 3.6 4.5 3.5 3.6 4.5  CL 112* 113* 112* 115* 110  CO2 24 24 27 28 28   GLUCOSE 98 90 120* 132* 77  BUN 28* 21* 16 12 9   CREATININE 0.80 0.68 0.49 0.50 0.50  CALCIUM 7.2* 7.1* 7.6* 7.3* 7.6*  MG 1.8 2.4 2.1  --   --   PHOS 3.2 2.8 2.0*  --   --    Liver Function Tests: Recent Labs  Lab 07/20/18 1023 07/20/18 2258 07/21/18 0429 07/23/18 0354 07/24/18 0330  AST 333* 214* 169* 35 26  ALT 165* 158* 142*  55* 44  ALKPHOS 41 37* 38 32* 34*  BILITOT 0.4 0.4 0.3 0.3 0.3  PROT 4.9* 4.9* 4.9* 4.0* 4.0*  ALBUMIN 2.8* 2.4* 2.4* 1.9* 1.9*   Recent Labs  Lab 07/21/18 0429  LIPASE 24   No results for input(s): AMMONIA in the last 168 hours. CBC: Recent Labs  Lab 07/20/18 1023 07/21/18 0429 07/22/18 0337 07/23/18 0354 07/24/18 0413  WBC 3.0* 13.9* 7.1 7.2 4.5  NEUTROABS 2.7  --   --   --   --   HGB 11.3* 10.8* 9.4* 9.4* 9.9*  HCT 35.6* 33.4* 28.9* 29.3* 30.5*  MCV 95.7 93.3 94.1 94.8 93.6  PLT 122* 170 109* 125* 124*   Cardiac Enzymes: No results for input(s):  CKTOTAL, CKMB, CKMBINDEX, TROPONINI in the last 168 hours. BNP: Invalid input(s): POCBNP CBG: Recent Labs  Lab 07/23/18 2301 07/23/18 2357 07/24/18 0328 07/24/18 0403 07/24/18 0749  GLUCAP 59* 130* 66* 99 94   D-Dimer No results for input(s): DDIMER in the last 72 hours. Hgb A1c No results for input(s): HGBA1C in the last 72 hours. Lipid Profile No results for input(s): CHOL, HDL, LDLCALC, TRIG, CHOLHDL, LDLDIRECT in the last 72 hours. Thyroid function studies No results for input(s): TSH, T4TOTAL, T3FREE, THYROIDAB in the last 72 hours.  Invalid input(s): FREET3 Anemia work up No results for input(s): VITAMINB12, FOLATE, FERRITIN, TIBC, IRON, RETICCTPCT in the last 72 hours. Urinalysis    Component Value Date/Time   COLORURINE AMBER (A) 07/20/2018 1143   APPEARANCEUR CLOUDY (A) 07/20/2018 1143   LABSPEC 1.017 07/20/2018 1143   PHURINE 5.0 07/20/2018 1143   GLUCOSEU NEGATIVE 07/20/2018 1143   HGBUR SMALL (A) 07/20/2018 1143   BILIRUBINUR SMALL (A) 07/20/2018 1143   BILIRUBINUR neg 10/07/2013 0954   KETONESUR 5 (A) 07/20/2018 1143   PROTEINUR 100 (A) 07/20/2018 1143   UROBILINOGEN negative 10/07/2013 0954   NITRITE NEGATIVE 07/20/2018 1143   LEUKOCYTESUR NEGATIVE 07/20/2018 1143   Sepsis Labs Invalid input(s): PROCALCITONIN,  WBC,  LACTICIDVEN Microbiology Recent Results (from the past 240 hour(s))  Culture, blood (Routine x 2)     Status: None (Preliminary result)   Collection Time: 07/20/18 10:23 AM  Result Value Ref Range Status   Specimen Description   Final    BLOOD RIGHT FOREARM Performed at [facility], [place] [address] [place], [place] [address]    Special Requests   Final    BOTTLES DRAWN AEROBIC AND ANAEROBIC Blood Culture adequate volume Performed at [facility] [address] [place], [place] [address]    Culture   Final    NO GROWTH 3 DAYS Performed at [facility], [place] [address] [place],  [place] [address]    Report Status PENDING  Incomplete  Culture, blood (Routine x 2)     Status: None (Preliminary result)   Collection Time: 07/20/18 10:57 AM  Result Value Ref Range Status   Specimen Description   Final    BLOOD LEFT FOREARM Performed at [facility] [address] [place], [place] [address]    Special Requests   Final    BOTTLES DRAWN AEROBIC AND ANAEROBIC Blood Culture results may not be optimal due to an inadequate volume of blood received in culture bottles Performed at [facility] [address] [place], [place] [address]    Culture   Final    NO GROWTH 3 DAYS Performed at [facility], [place] [address], [place], [place]  [address]    Report Status PENDING  Incomplete  Urine culture     Status: None   Collection Time: 07/20/18 11:43 AM  Result Value Ref Range Status   Specimen Description   Final    URINE, CATHETERIZED Performed at [facility] [address] [place], [place] [address]    Special Requests   Final    NONE Performed at [facility], [place] [address], [place], [place] [address]    Culture   Final    NO GROWTH Performed at [facility], [place] [address], [place], [place] [address]    Report Status 07/21/2018 FINAL  Final  MRSA PCR Screening     Status: None   Collection Time: 07/20/18  5:48 PM  Result Value Ref Range Status   MRSA by PCR NEGATIVE NEGATIVE Final    Comment:        The GeneXpert MRSA Assay (FDA approved for NASAL specimens only), is one component of a comprehensive MRSA colonization surveillance program. It is not intended to diagnose MRSA infection nor to guide or monitor treatment for MRSA infections. Performed at [facility], [place] [address], [place], [place] [address]   Expectorated sputum assessment w rflx to resp cult     Status: None   Collection Time: 07/20/18 11:41 PM  Result Value Ref Range Status    Specimen Description SPUTUM  Final   Special Requests NONE  Final   Sputum evaluation   Final    THIS SPECIMEN IS ACCEPTABLE FOR SPUTUM CULTURE Performed at [facility], [place] [address] [place], [place] [address]    Report Status 07/22/2018 FINAL  Final  Culture, respiratory     Status: None   Collection Time: 07/20/18 11:41 PM  Result Value Ref Range Status   Specimen Description   Final    SPUTUM Performed at [facility] [address], [place], [place] [address]    Special Requests   Final    NONE Reflexed from [id number] Performed at [facility] [address], [place], [place] [address]    Gram Stain   Final    MODERATE WBC PRESENT,BOTH PMN AND MONONUCLEAR RARE GRAM POSITIVE COCCI IN PAIRS RARE GRAM POSITIVE RODS Performed at [facility], [place] [address] [place], [place] [address]    Culture FEW ESCHERICHIA COLI  Final   Report Status 07/24/2018 FINAL  Final   Organism ID, Bacteria ESCHERICHIA COLI  Final      Susceptibility   Escherichia coli - MIC*    AMPICILLIN 4 SENSITIVE Sensitive     CEFAZOLIN <=4 SENSITIVE Sensitive     CEFEPIME <=1 SENSITIVE Sensitive     CEFTAZIDIME <=1 SENSITIVE Sensitive     CEFTRIAXONE <=1 SENSITIVE Sensitive     CIPROFLOXACIN <=0.25 SENSITIVE Sensitive     GENTAMICIN <=1 SENSITIVE Sensitive     IMIPENEM <=0.25 SENSITIVE Sensitive     TRIMETH/SULFA <=20 SENSITIVE Sensitive     AMPICILLIN/SULBACTAM <=2 SENSITIVE Sensitive     PIP/TAZO <=4 SENSITIVE Sensitive     Extended ESBL NEGATIVE Sensitive     * FEW ESCHERICHIA COLI     Time coordinating discharge: 35 minutes  SIGNED:   Aline August, MD  [facility] 07/24/2018, 9:49 AM Pager: [phone]  If 7PM-7AM, please contact night-coverage [email or web address] Password [email or web address]

## 2018-07-24 NOTE — Evaluation (Signed)
Occupational Therapy Evaluation Patient Details Name: Morgan [name] MRN: [id number] DOB: November 29, 1966 Today's Date: 07/24/2018    History of Present Illness 52 yo female admitted after falling at home. Diagnosis: Pna, sepsis, hypoglycemia, hypotension   Clinical Impression   This 52 y/o female presents with the above. At baseline pt is independent with ADLs and functional mobility, was driving. Pt completing room level mobility without AD, toileting and standing grooming ADLs with overall minguard assist, intermittent minA with functional mobility. Pt reports she lives alone but has family close by who can check in PRN. Will continue to follow while pt remains in acute setting to maximize her overall safety and independence with ADLs and mobility prior to discharge home. Do not anticipate pt will require follow up OT services.     Follow Up Recommendations  No OT follow up;Supervision - Intermittent    Equipment Recommendations  None recommended by OT           Precautions / Restrictions Precautions Precautions: Fall Restrictions Weight Bearing Restrictions: No      Mobility Bed Mobility Overal bed mobility: Needs Assistance Bed Mobility: Supine to Sit     Supine to sit: Supervision     General bed mobility comments: for safety, no physical assist required   Transfers Overall transfer level: Needs assistance Equipment used: None Transfers: Sit to/from Stand Sit to Stand: Min guard         General transfer comment: close guarding for safety. Mildly unsteady. Pt denied lighteadedness    Balance Overall balance assessment: History of Falls;Mild deficits observed, not formally tested                                         ADL either performed or assessed with clinical judgement   ADL Overall ADL's : Needs assistance/impaired Eating/Feeding: Independent;Sitting   Grooming: Wash/dry face;Oral care;Min guard;Standing   Upper Body Bathing: Set  up;Sitting   Lower Body Bathing: Min guard;Sit to/from stand   Upper Body Dressing : Set up;Sitting   Lower Body Dressing: Min guard;Sit to/from stand   Toilet Transfer: Minimal assistance;Min guard;Ambulation;BSC Toilet Transfer Details (indicate cue type and reason): use of BSC, ambulatory distance to bathroom  Toileting- Clothing Manipulation and Hygiene: Min guard;Sit to/from stand Toileting - Clothing Manipulation Details (indicate cue type and reason): pt performing gown management and peri-care with setup assist, minguard for static standing      Functional mobility during ADLs: Min guard;Minimal assistance General ADL Comments: pt completing room level ambulation, toileting and standing grooming ADLs prior to transfer to recliner; pt mildly unsteady and occasionally seeking UE support for added stability      Vision         Perception     Praxis      Pertinent Vitals/Pain Pain Assessment: No/denies pain     Hand Dominance     Extremity/Trunk Assessment Upper Extremity Assessment Upper Extremity Assessment: Overall WFL for tasks assessed   Lower Extremity Assessment Lower Extremity Assessment: Defer to PT evaluation   Cervical / Trunk Assessment Cervical / Trunk Assessment: Normal   Communication Communication Communication: No difficulties   Cognition Arousal/Alertness: Awake/alert Behavior During Therapy: WFL for tasks assessed/performed Overall Cognitive Status: Within Functional Limits for tasks assessed  General Comments       Exercises     Shoulder Instructions      Home Living Family/patient expects to be discharged to:: Private residence Living Arrangements: Alone Available Help at Discharge: Family;Available PRN/intermittently Type of Home: Mobile home Home Access: Ramped entrance     Home Layout: One level     Bathroom Shower/Tub: Walk-in shower;Tub/shower unit   Bathroom Toilet:  Standard     Home Equipment: Environmental consultant - 2 wheels;Cane - single point;Wheelchair - [employer];Shower seat          Prior Functioning/Environment Level of Independence: Independent        Comments: was driving, not working (on disability)        OT Problem List: Decreased strength;Impaired balance (sitting and/or standing);Decreased activity tolerance      OT Treatment/Interventions: Self-care/ADL training;DME and/or AE instruction;Therapeutic activities;Balance training;Therapeutic exercise;Energy conservation;Patient/family education    OT Goals(Current goals can be found in the care plan section) Acute Rehab OT Goals Patient Stated Goal: planning for home today  OT Goal Formulation: With patient Time For Goal Achievement: 08/07/18 Potential to Achieve Goals: Good  OT Frequency: Min 2X/week   Barriers to D/C:            Co-evaluation              AM-PAC PT "6 Clicks" Daily Activity     Outcome Measure Help from another person eating meals?: None Help from another person taking care of personal grooming?: None Help from another person toileting, which includes using toliet, bedpan, or urinal?: None Help from another person bathing (including washing, rinsing, drying)?: A Little Help from another person to put on and taking off regular upper body clothing?: None Help from another person to put on and taking off regular lower body clothing?: A Little 6 Click Score: 22   End of Session Equipment Utilized During Treatment: Gait belt Nurse Communication: Mobility status  Activity Tolerance: Patient tolerated treatment well Patient left: in chair;with call bell/phone within reach;with chair alarm set  OT Visit Diagnosis: Unsteadiness on feet (R26.81)                Time: 6195-0932 OT Time Calculation (min): 22 min Charges:  OT General Charges $OT Visit: 1 Visit OT Evaluation $OT Eval Moderate Complexity: 1 Mod  Lou Cal, OT Pager  [phone] 07/24/2018   Raymondo Band 07/24/2018, 9:50 AM

## 2018-07-24 NOTE — Discharge Instructions (Signed)
Hypotension As your heart beats, it forces blood through your body. This force is called blood pressure. If you have hypotension, you have low blood pressure. When your blood pressure is too low, you may not get enough blood to your brain. You may feel weak, feel light-headed, have a fast heartbeat, or even pass out (faint). Follow these instructions at home: Eating and drinking  Drink enough fluids to keep your pee (urine) clear or pale yellow.  Eat a healthy diet, and follow instructions from your doctor about eating or drinking restrictions. A healthy diet includes: ? Fresh fruits and vegetables. ? Whole grains. ? Low-fat (lean) meats. ? Low-fat dairy products.  Eat extra salt only as told. Do not add extra salt to your diet unless your doctor tells you to.  Eat small meals often.  Avoid standing up quickly after you eat. Medicines  Take over-the-counter and prescription medicines only as told by your doctor. ? Follow instructions from your doctor about changing how much you take (the dosage) of your medicines, if this applies. ? Do not stop or change your medicine on your own. General instructions  Wear compression stockings as told by your doctor.  Get up slowly from lying down or sitting.  Avoid hot showers and a lot of heat as told by your doctor.  Return to your normal activities as told by your doctor. Ask what activities are safe for you.  Do not use any products that contain nicotine or tobacco, such as cigarettes and e-cigarettes. If you need help quitting, ask your doctor.  Keep all follow-up visits as told by your doctor. This is important. Contact a doctor if:  You throw up (vomit).  You have watery poop (diarrhea).  You have a fever for more than 2-3 days.  You feel more thirsty than normal.  You feel weak and tired. Get help right away if:  You have chest pain.  You have a fast or irregular heartbeat.  You lose feeling (get numbness) in any part  of your body.  You cannot move your arms or your legs.  You have trouble talking.  You get sweaty or feel light-headed.  You faint.  You have trouble breathing.  You have trouble staying awake.  You feel confused. This information is not intended to replace advice given to you by your health care provider. Make sure you discuss any questions you have with your health care provider. Document Released: 02/22/2010 Document Revised: 08/16/2016 Document Reviewed: 08/16/2016 Elsevier Interactive Patient Education  2017 [place]. Blood Glucose Monitoring, Adult Monitoring your blood sugar (glucose) helps you manage your diabetes. It also helps you and your health care provider determine how well your diabetes management plan is working. Blood glucose monitoring involves checking your blood glucose as often as directed, and keeping a record (log) of your results over time. Why should I monitor my blood glucose? Checking your blood glucose regularly can:  Help you understand how food, exercise, illnesses, and medicines affect your blood glucose.  Let you know what your blood glucose is at any time. You can quickly tell if you are having low blood glucose (hypoglycemia) or high blood glucose (hyperglycemia).  Help you and your health care provider adjust your medicines as needed.  When should I check my blood glucose? Follow instructions from your health care provider about how often to check your blood glucose. This may depend on:  The type of diabetes you have.  How well-controlled your diabetes is.  Medicines  you are taking.  If you have type 1 diabetes:  Check your blood glucose at least 2 times a day.  Also check your blood glucose: ? Before every insulin injection. ? Before and after exercise. ? Between meals. ? 2 hours after a meal. ? Occasionally between 2:00 a.m. and 3:00 a.m., as directed. ? Before potentially dangerous tasks, like driving or using heavy  machinery. ? At bedtime.  You may need to check your blood glucose more often, up to 6-10 times a day: ? If you use an insulin pump. ? If you need multiple daily injections (MDI). ? If your diabetes is not well-controlled. ? If you are ill. ? If you have a history of severe hypoglycemia. ? If you have a history of not knowing when your blood glucose is getting low (hypoglycemia unawareness). If you have type 2 diabetes:  If you take insulin or other diabetes medicines, check your blood glucose at least 2 times a day.  If you are on intensive insulin therapy, check your blood glucose at least 4 times a day. Occasionally, you may also need to check between 2:00 a.m. and 3:00 a.m., as directed.  Also check your blood glucose: ? Before and after exercise. ? Before potentially dangerous tasks, like driving or using heavy machinery.  You may need to check your blood glucose more often if: ? Your medicine is being adjusted. ? Your diabetes is not well-controlled. ? You are ill. What is a blood glucose log?  A blood glucose log is a record of your blood glucose readings. It helps you and your health care provider: ? Look for patterns in your blood glucose over time. ? Adjust your diabetes management plan as needed.  Every time you check your blood glucose, write down your result and notes about things that may be affecting your blood glucose, such as your diet and exercise for the day.  Most glucose meters store a record of glucose readings in the meter. Some meters allow you to download your records to a computer. How do I check my blood glucose? Follow these steps to get accurate readings of your blood glucose: Supplies needed   Blood glucose meter.  Test strips for your meter. Each meter has its own strips. You must use the strips that come with your meter.  A needle to prick your finger (lancet). Do not use lancets more than once.  A device that holds the lancet (lancing  device).  A journal or log book to write down your results. Procedure  Wash your hands with soap and water.  Prick the side of your finger (not the tip) with the lancet. Use a different finger each time.  Gently rub the finger until a small drop of blood appears.  Follow instructions that come with your meter for inserting the test strip, applying blood to the strip, and using your blood glucose meter.  Write down your result and any notes. Alternative testing sites  Some meters allow you to use areas of your body other than your finger (alternative sites) to test your blood.  If you think you may have hypoglycemia, or if you have hypoglycemia unawareness, do not use alternative sites. Use your finger instead.  Alternative sites may not be as accurate as the fingers, because blood flow is slower in these areas. This means that the result you get may be delayed, and it may be different from the result that you would get from your finger.  The most  common alternative sites are: ? Forearm. ? Thigh. ? Palm of the hand. Additional tips  Always keep your supplies with you.  If you have questions or need help, all blood glucose meters have a 24-hour hotline number that you can call. You may also contact your health care provider.  After you use a few boxes of test strips, adjust (calibrate) your blood glucose meter by following instructions that came with your meter. This information is not intended to replace advice given to you by your health care provider. Make sure you discuss any questions you have with your health care provider. Document Released: 12/01/2003 Document Revised: 06/17/2016 Document Reviewed: 05/09/2016 Elsevier Interactive Patient Education  2017 [place] With Diabetes Diabetes (type 1 diabetes mellitus or type 2 diabetes mellitus) is a condition in which the body does not have enough of a hormone called insulin, or the body does not respond  properly to insulin. Normally, insulin allows sugars (glucose) to enter cells in the body. The cells use glucose for energy. With diabetes, extra glucose builds up in the blood instead of going into cells, which results in high blood glucose (hyperglycemia). How to manage lifestyle changes Managing diabetes includes medical treatments as well as lifestyle changes. If diabetes is not managed well, serious physical and emotional complications can occur. Taking good care of yourself means that you are responsible for:  Monitoring glucose regularly.  Eating a healthy diet.  Exercising regularly.  Meeting with health care providers.  Taking medicines as directed.  Some people may feel a lot of stress about managing their diabetes. This is known as emotional distress, and it is very common. Living with diabetes can place you at risk for emotional distress, depression, or anxiety. These disorders can be confusing and can make diabetes management more difficult. How to recognize stress Emotional distress Symptoms of emotional distress include:  Anger about having a diagnosis of diabetes.  Fear or frustration about your diagnosis and the changes you need to make to manage the condition.  Being overly worried about the care that you need or the cost of the care you need.  Feeling like you caused your condition by doing something wrong.  Fear of unpredictable situations, like low or high blood glucose.  Feeling judged by your health care providers.  Feeling very alone with the disease.  Getting too tired or "burned out" with the demands of daily care.  Depression Having diabetes means that you are at a higher risk for depression. Having depression also means that you are at a higher risk for diabetes. Your health care provider may test (screen) you for symptoms of depression. It is important to recognize depression symptoms and to start treatment for it soon after it is diagnosed. The  following are some symptoms of depression:  Loss of interest in things that you used to enjoy.  Trouble sleeping, or often waking up early and not being able to get back to sleep.  A change in appetite.  Feeling tired most of the day.  Feeling nervous and anxious.  Feeling guilty and worrying that you are a burden to others.  Feeling depressed more often than you do not feel that way.  Thoughts of hurting yourself or feeling that you want to die.  If you have any of these symptoms for 2 weeks or longer, reach out to a health care provider. Where to find support  Ask your health care provider to recommend a therapist who understands both  depression and diabetes.  Search for information and support from the American Diabetes Association: www.diabetes.org  Find a certified diabetes educator and make an appointment through [facility] [employer]: www.diabeteseducator.org Follow these instructions at home: Managing emotional distress The following are some ways to manage emotional distress:  Talk with your health care provider or certified diabetes educator. Consider working with a counselor or therapist.  Learn as much as you can about diabetes and its treatment. Meet with a certified diabetes educator or take a class to learn how to manage your condition.  Keep a journal of your thoughts and concerns.  Accept that some things are out of your control.  Talk with other people who have diabetes. It can help to talk with others about the emotional distress that you feel.  Find ways to manage stress that work for you. These may include art or music therapy, exercise, meditation, and hobbies.  Seek support from spiritual leaders, family, and friends.  General instructions  Follow your diabetes management plan.  Keep all follow-up visits as told by your health care provider. This is important. Get help right away if:  You have thoughts about hurting  yourself or others. If you ever feel like you may hurt yourself or others, or have thoughts about taking your own life, get help right away. You can go to your nearest emergency department or call:  Your local emergency services (911 in the [place]  A suicide crisis helpline, such as the [facility] at 9316922404. This is open 24 hours a day.  Summary  Diabetes (type 1 diabetes mellitus or type 2 diabetes mellitus) is a condition in which the body does not have enough of a hormone called insulin, or the body does not respond properly to insulin.  Living with diabetes puts you at risk for medical issues, and it also puts you at risk for emotional issues such as emotional distress, depression, and anxiety.  Recognizing the symptoms of emotional distress and depression may help you avoid problems with your diabetes control. It is important to start treatment for emotional distress and depression soon after they are diagnosed.  Having diabetes means that you are at a higher risk for depression. Ask your health care provider to recommend a therapist who understands both depression and diabetes.  If you experience symptoms of emotional distress or depression, it is important to discuss this with your health care provider, certified diabetes educator, or therapist. This information is not intended to replace advice given to you by your health care provider. Make sure you discuss any questions you have with your health care provider. Document Released: 04/13/2017 Document Revised: 04/13/2017 Document Reviewed: 04/13/2017 Elsevier Interactive Patient Education  2018 [place] Content in Foods Generally, most healthy people need around 50 grams of protein each day. Depending on your overall health, you may need more or less protein in your diet. Talk to your health care provider or dietitian about how much protein you need. See the following list for the  protein content of some common foods. High-protein foods High-protein foods contain 4 grams (4 g) or more of protein per serving. They include:  Beef, ground sirloin (cooked) -- 3 oz have 24 g of protein.  Cheese (hard) -- 1 oz has 7 g of protein.  Chicken breast, boneless and skinless (cooked) -- 3 oz have 13.4 g of protein.  Cottage cheese -- 1/2 cup has 13.4 g of protein.  Egg -- 1 egg  has 6 g of protein.  Fish, filet (cooked) -- 1 oz has 6-7 g of protein.  Garbanzo beans (canned or cooked) -- 1/2 cup has 6-7 g of protein.  Kidney beans (canned or cooked) -- 1/2 cup has 6-7 g of protein.  Lamb (cooked) -- 3 oz has 24 g of protein.  Milk -- 1 cup (8 oz) has 8 g of protein.  Nuts (peanuts, pistachios, almonds) -- 1 oz has 6 g of protein.  Peanut butter -- 1 oz has 7-8 g of protein.  Pork tenderloin (cooked) -- 3 oz has 18.4 g of protein.  Pumpkin seeds -- 1 oz has 8.5 g of protein.  Soybeans (roasted) -- 1 oz has 8 g of protein.  Soybeans (cooked) -- 1/2 cup has 11 g of protein.  Soy milk -- 1 cup (8 oz) has 5-10 g of protein.  Soy or vegetable patty -- 1 patty has 11 g of protein.  Sunflower seeds -- 1 oz has 5.5 g of protein.  Tofu (firm) -- 1/2 cup has 20 g of protein.  Tuna (canned in water) -- 3 oz has 20 g of protein.  Yogurt -- 6 oz has 8 g of protein.  Low-protein foods Low-protein foods contain 3 grams (3 g) or less of protein per serving. They include:  Beets (raw or cooked) -- 1/2 cup has 1.5 g of protein.  Bran cereal -- 1/2 cup has 2-3 g of protein.  Bread -- 1 slice has 2.5 g of protein.  Broccoli (raw or cooked) -- 1/2 cup has 2 g of protein.  Collard greens (raw or cooked) -- 1/2 cup has 2 g of protein.  Corn (fresh or cooked) -- 1/2 cup has 2 g of protein.  Cream cheese -- 1 oz has 2 g of protein.  Creamer (half-and-half) -- 1 oz has 1 g of protein.  Flour tortilla -- 1 tortilla has 2.5 g of protein  Frozen yogurt -- 1/2 cup has 3  g of protein.  Fruit or vegetable juice -- 1/2 cup has 1 g of protein.  Green beans (raw or cooked) -- 1/2 cup has 1 g of protein.  Green peas (canned) -- 1/2 cup has 3.5 g of protein.  Muffins -- 1 small muffin (2 oz) has 3 g of protein.  Oatmeal (cooked) -- 1/2 cup has 3 g of protein.  Potato (baked with skin) -- 1 medium potato has 3 g of protein.  Rice (cooked) -- 1/2 cup has 2.5-3.5 g of protein.  Sour cream -- 1/2 cup has 2.5 g of protein.  Spinach (cooked) -- 1/2 cup has 3 g of protein.  Squash (cooked) -- 1/2 cup has 1.5 g of protein.  Actual amounts of protein may be different depending on processing. Talk with your health care provider or dietitian about what foods are recommended for you. This information is not intended to replace advice given to you by your health care provider. Make sure you discuss any questions you have with your health care provider. Document Released: 02/27/2016 Document Revised: 08/08/2016 Document Reviewed: 08/08/2016 Elsevier Interactive Patient Education  2018 [employer].  Hypoglycemia Hypoglycemia is when the sugar (glucose) level in the blood is too low. Symptoms of low blood sugar may include:  Feeling: ? Hungry. ? Worried or nervous (anxious). ? Sweaty and clammy. ? Confused. ? Dizzy. ? Sleepy. ? Sick to your stomach (nauseous).  Having: ? A fast heartbeat. ? A headache. ? A change in your vision. ?  Jerky movements that you cannot control (seizure). ? Nightmares. ? Tingling or no feeling (numbness) around the mouth, lips, or tongue.  Having trouble with: ? Talking. ? Paying attention (concentrating). ? Moving (coordination). ? Sleeping.  Shaking.  Passing out (fainting).  Getting upset easily (irritability).  Low blood sugar can happen to people who have diabetes and people who do not have diabetes. Low blood sugar can happen quickly, and it can be an emergency. Treating Low Blood Sugar Low blood sugar is often  treated by eating or drinking something sugary right away. If you can think clearly and swallow safely, follow the 15:15 rule:  Take 15 grams of a fast-acting carb (carbohydrate). Some fast-acting carbs are: ? 1 tube of glucose gel. ? 3 sugar tablets (glucose pills). ? 6-8 pieces of hard candy. ? 4 oz (120 mL) of fruit juice. ? 4 oz (120 mL) of regular (not diet) soda.  Check your blood sugar 15 minutes after you take the carb.  If your blood sugar is still at or below 70 mg/dL (3.9 mmol/L), take 15 grams of a carb again.  If your blood sugar does not go above 70 mg/dL (3.9 mmol/L) after 3 tries, get help right away.  After your blood sugar goes back to normal, eat a meal or a snack within 1 hour.  Treating Very Low Blood Sugar If your blood sugar is at or below 54 mg/dL (3 mmol/L), you have very low blood sugar (severe hypoglycemia). This is an emergency. Do not wait to see if the symptoms will go away. Get medical help right away. Call your local emergency services (911 in the [place] Do not drive yourself to the hospital. If you have very low blood sugar and you cannot eat or drink, you may need a glucagon shot (injection). A family member or friend should learn how to check your blood sugar and how to give you a glucagon shot. Ask your doctor if you need to have a glucagon shot kit at home. Follow these instructions at home: General instructions  Avoid any diets that cause you to not eat enough food. Talk with your doctor before you start any new diet.  Take over-the-counter and prescription medicines only as told by your doctor.  Limit alcohol to no more than 1 drink per day for nonpregnant women and 2 drinks per day for men. One drink equals 12 oz of beer, 5 oz of wine, or 1 oz of hard liquor.  Keep all follow-up visits as told by your doctor. This is important. If You Have Diabetes:   Make sure you know the symptoms of low blood sugar.  Always keep a source of sugar with  you, such as: ? Sugar. ? Sugar tablets. ? Glucose gel. ? Fruit juice. ? Regular soda (not diet soda). ? Milk. ? Hard candy. ? Honey.  Take your medicines as told.  Follow your exercise and meal plan. ? Eat on time. Do not skip meals. ? Follow your sick day plan when you cannot eat or drink normally. Make this plan ahead of time with your doctor.  Check your blood sugar as often as told by your doctor. Always check before and after exercise.  Share your diabetes care plan with: ? Your work or school. ? People you live with.  Check your pee (urine) for ketones: ? When you are sick. ? As told by your doctor.  Carry a card or wear jewelry that says you have diabetes. If You Have  Low Blood Sugar From Other Causes:   Check your blood sugar as often as told by your doctor.  Follow instructions from your doctor about what you cannot eat or drink. Contact a doctor if:  You have trouble keeping your blood sugar in your target range.  You have low blood sugar often. Get help right away if:  You still have symptoms after you eat or drink something sugary.  Your blood sugar is at or below 54 mg/dL (3 mmol/L).  You have jerky movements that you cannot control.  You pass out. These symptoms may be an emergency. Do not wait to see if the symptoms will go away. Get medical help right away. Call your local emergency services (911 in the [place] Do not drive yourself to the hospital. This information is not intended to replace advice given to you by your health care provider. Make sure you discuss any questions you have with your health care provider. Document Released: 02/22/2010 Document Revised: 05/05/2016 Document Reviewed: 01/01/2016 Elsevier Interactive Patient Education  [employer].

## 2018-07-24 NOTE — Progress Notes (Signed)
Hypoglycemic Event  CBG: 66  Treatment: D50, feeling too nauseous to eat  Symptoms: None  Follow-up CBG Time: 0400   CBG Result: 99  Possible Reasons for Event: Unknown  Comments/MD notified:None    Morgan [name]

## 2018-07-24 NOTE — Care Management Note (Signed)
Case Management Note  Patient Details  Name: ZILLAH ALEXIE MRN: [id number] Date of Birth: 19-Jun-1966  Subjective/Objective:     Discharged to home               Action/Plan:  No cm needs present.   Expected Discharge Date:  07/24/18               Expected Discharge Plan:  Home/Self Care  In-House Referral:     Discharge planning Services  CM Consult  Post Acute Care Choice:    Choice offered to:     DME Arranged:    DME Agency:     HH Arranged:    HH Agency:     Status of Service:  Completed, signed off  If discussed at [employer] of Stay Meetings, dates discussed:    Additional Comments:  Leeroy Cha, RN 07/24/2018, 10:32 AM

## 2018-07-24 NOTE — Progress Notes (Signed)
PULMONARY / CRITICAL CARE MEDICINE  Name: Morgan [name] MRN: [id number] DOB: 07/04/1966    ADMISSION DATE:  07/20/2018   REFERRING MD:  Dr. Zenia Resides, ER  CHIEF COMPLAINT:  Hypotension, RLL pneumonia   HISTORY OF PRESENT ILLNESS:   52 year old woman who lost 175 pounds after gastric bypass, admitted with hypotension after a fall at home and history of recurrent hypoglycemic episodes, she was diagnosed with CAP.    SUBJECTIVE: Doing well. Feels better this morning. No room air. No fevers. Still with episodes of low glucose. Discussed importance of frequent small protein based meals throughout the day. Encouraged early follow up with PCP / endocrinology   STUDIES:   CULTURES: Blood 8/09 >>ng Urine 8/09 >> Pneumococcal Ag 8/09 >>neg Sputum 8/13 + E. Coli   ANTIBIOTICS: Vancomycin 8/09 >> 8/9 Zosyn 8/09 >> 8/9 Rocephin 8/09 >> 8/9 Zithromax 8/09 >> 8/9 Cefdinir - 8/10  SIGNIFICANT EVENTS: 8/09 Admit 8/12 hypotension, bradycardia overnight, low blood glucose   LINES/TUBES: PIV >> PEG placed July 2019  VITAL SIGNS: BP 104/60   Pulse 62   Temp 98.3 F (36.8 C) (Oral)   Resp 16   Ht 5\' 4"  (1.626 m)   Wt 56.7 kg   LMP 03/17/2017   SpO2 96%   BMI 21.46 kg/m   INTAKE / OUTPUT: I/O last 3 completed shifts: In: 3683.8 [I.V.:1678.5; IV Piggyback:2005.4] Out: 2825 [Urine:2825]  PHYSICAL EXAMINATION: General appearance: 52 y.o., female, NAD, conversant Eyes: anicteric sclerae, moist conjunctivae; no lid-lag; PERRL, tracking appropriately HENT: NCAT; oropharynx, MMM,  Neck: Trachea midline; supple, lymphadenopathy, no JVD Lungs: diminished breath sounds in the right base, egophony  CV: RRR, S1, S2, no MRGs  Abdomen: Soft, non-tender; non-distended, BS present  Extremities: No peripheral edema or radial and DP pulses present bilaterally  Skin: Normal temperature, turgor and texture; no rash Psych: Appropriate affect Neuro: Alert and oriented to person and place, no  focal deficit   LABS:  BMET Recent Labs  Lab 07/22/18 0337 07/23/18 0354 07/24/18 0330  NA 143 146* 144  K 3.5 3.6 4.5  CL 112* 115* 110  CO2 27 28 28   BUN 16 12 9   CREATININE 0.49 0.50 0.50  GLUCOSE 120* 132* 77    Electrolytes Recent Labs  Lab 07/20/18 2258 07/21/18 0429 07/22/18 0337 07/23/18 0354 07/24/18 0330  CALCIUM 7.2* 7.1* 7.6* 7.3* 7.6*  MG 1.8 2.4 2.1  --   --   PHOS 3.2 2.8 2.0*  --   --     CBC Recent Labs  Lab 07/22/18 0337 07/23/18 0354 07/24/18 0413  WBC 7.1 7.2 4.5  HGB 9.4* 9.4* 9.9*  HCT 28.9* 29.3* 30.5*  PLT 109* 125* 124*    Coag's Recent Labs  Lab 07/20/18 1023  INR 1.17    Sepsis Markers Recent Labs  Lab 07/20/18 2258 07/21/18 0139 07/21/18 0429 07/22/18 0337  LATICACIDVEN 1.1 2.3* 0.9  --   PROCALCITON 37.17  --  29.88 16.44    ABG Recent Labs  Lab 07/20/18 2343  PHART 7.388  PCO2ART 42.1  PO2ART 74.2*    Liver Enzymes Recent Labs  Lab 07/21/18 0429 07/23/18 0354 07/24/18 0330  AST 169* 35 26  ALT 142* 55* 44  ALKPHOS 38 32* 34*  BILITOT 0.3 0.3 0.3  ALBUMIN 2.4* 1.9* 1.9*    Cardiac Enzymes No results for input(s): TROPONINI, PROBNP in the last 168 hours.  Glucose Recent Labs  Lab 07/23/18 2018 07/23/18 2301 07/23/18 2357 07/24/18 0328 07/24/18 0403 07/24/18  Spink* 99 94    Imaging  CXR 8/11 - reviewed with worsening RLL consolidation and associated atelectasis  The patient's images have been independently reviewed by me.    CT Chest 8/12 - large RLL consolidation, very small effusion, consistent with alveloar filling process such as pneumonia. The patient's images have been independently reviewed by me.   ECHO: Study Conclusions - Left ventricle: The cavity size was normal. Wall thickness was   normal. Systolic function was normal. The estimated ejection   fraction was in the range of 55% to 60%. Doppler parameters are   consistent with abnormal left  ventricular relaxation (grade 1   diastolic dysfunction). - Left atrium: The atrium was mildly dilated.  DISCUSSION: 52 yo female with septic shock, now resolved, s/p fall, recurrent hypoglycemic episodes, found to have a RLL pneumonia.   ASSESSMENT / PLAN:  Septic Shock, now resolved, BP remained stable  E. Coli, RLL aspiration pneumonia, RLL dense consolidation on CT imaging, no endobronchial lesion or obstruction, likely s/p fall/syncope from hypoglycemia  Acute hypoxic respiratory failure secondary to the above, on room air  Hx of Bipolar disorder, chronic headaches. 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, now with recurrent episodes of hypoglycemia, one must consider hyperinsulinemia syndrome associated with post bypass  Protein calorie malnutrition  - Dc on oral abx per primary  - Recommend follow up with primary care and repeat CXR in 6 weeks to ensure resolution of the RLL consolidation  - Would recommend referral to endocrinology for management of hypoglycemia s/p RGBP - Pulmonary will sign off, please call with any questions  DVT prophylaxis - Lovenox  Garner Nash, DO [facility] 07/24/2018 9:29 AM  Personal pager: [phone] If unanswered, please page CCM On-call: [phone]

## 2018-07-24 NOTE — Telephone Encounter (Signed)
appt scheduled Pt notified 

## 2018-07-25 LAB — CULTURE, BLOOD (ROUTINE X 2)
CULTURE: NO GROWTH
Culture: NO GROWTH
Special Requests: ADEQUATE

## 2018-07-27 ENCOUNTER — Encounter: Payer: Self-pay | Admitting: Family Medicine

## 2018-07-27 ENCOUNTER — Ambulatory Visit (INDEPENDENT_AMBULATORY_CARE_PROVIDER_SITE_OTHER): Payer: Medicare Other | Admitting: Family Medicine

## 2018-07-27 VITALS — BP 93/62 | HR 67 | Temp 97.9°F | Ht 64.0 in | Wt 152.5 lb

## 2018-07-27 DIAGNOSIS — J189 Pneumonia, unspecified organism: Secondary | ICD-10-CM

## 2018-07-27 DIAGNOSIS — J181 Lobar pneumonia, unspecified organism: Secondary | ICD-10-CM

## 2018-07-27 DIAGNOSIS — Z09 Encounter for follow-up examination after completed treatment for conditions other than malignant neoplasm: Secondary | ICD-10-CM | POA: Diagnosis not present

## 2018-07-27 DIAGNOSIS — D696 Thrombocytopenia, unspecified: Secondary | ICD-10-CM | POA: Diagnosis not present

## 2018-07-27 LAB — CBC WITH DIFFERENTIAL/PLATELET
BASOS ABS: 0 10*3/uL (ref 0.0–0.2)
Basos: 1 %
EOS (ABSOLUTE): 0.1 10*3/uL (ref 0.0–0.4)
Eos: 3 %
Hematocrit: 31.8 % — ABNORMAL LOW (ref 34.0–46.6)
Hemoglobin: 10.3 g/dL — ABNORMAL LOW (ref 11.1–15.9)
Immature Grans (Abs): 0.1 10*3/uL (ref 0.0–0.1)
Immature Granulocytes: 2 %
LYMPHS ABS: 1.1 10*3/uL (ref 0.7–3.1)
Lymphs: 28 %
MCH: 30.4 pg (ref 26.6–33.0)
MCHC: 32.4 g/dL (ref 31.5–35.7)
MCV: 94 fL (ref 79–97)
MONOCYTES: 12 %
MONOS ABS: 0.5 10*3/uL (ref 0.1–0.9)
NEUTROS ABS: 2.2 10*3/uL (ref 1.4–7.0)
Neutrophils: 54 %
PLATELETS: 289 10*3/uL (ref 150–450)
RBC: 3.39 x10E6/uL — AB (ref 3.77–5.28)
RDW: 13.6 % (ref 12.3–15.4)
WBC: 4 10*3/uL (ref 3.4–10.8)

## 2018-07-27 LAB — BASIC METABOLIC PANEL
BUN / CREAT RATIO: 21 (ref 9–23)
BUN: 15 mg/dL (ref 6–24)
CHLORIDE: 100 mmol/L (ref 96–106)
CO2: 32 mmol/L — ABNORMAL HIGH (ref 20–29)
Calcium: 8.3 mg/dL — ABNORMAL LOW (ref 8.7–10.2)
Creatinine, Ser: 0.71 mg/dL (ref 0.57–1.00)
GFR calc non Af Amer: 100 mL/min/{1.73_m2} (ref >59)
GFR, EST AFRICAN AMERICAN: 115 mL/min/{1.73_m2} (ref >59)
GLUCOSE: 82 mg/dL (ref 65–99)
POTASSIUM: 3.9 mmol/L (ref 3.5–5.2)
Sodium: 145 mmol/L — ABNORMAL HIGH (ref 134–144)

## 2018-07-27 NOTE — Patient Instructions (Signed)
You had labs performed today.  You will be contacted with the results of the labs once they are available, usually in the next 3 business days for routine lab work.   I am so glad to see that you are feeling better.

## 2018-07-27 NOTE — Progress Notes (Signed)
Subjective: CC: Hospital follow up PCP: Janora Norlander, DO GYI:RSWNI Morgan [name] is a 52 y.o. female presenting to clinic today for:  1. Hospital follow up Patient recently discharged from hospital after being hospitalized for septic shock secondary to right lobe pneumonia.  She was treated in the ICU with Levophed, IV antibiotics.  She was discharged on oral Ceftin ear for 3 additional days.  During hospital stay, she did have several episodes of hypo-glycemia.  Consideration for outpatient endocrinology eval recommended.  Repeat chest x-ray in 4 to 6 weeks after discharge also recommended.  Patient follows up today and notes that she has been doing well since discharge.  She notes occasional cough but sputum is fairly clear.  She completed her antibiotic today.  Denies any fevers, shortness of breath or wheeze.  She notes some lower extremity edema and abdominal edema.  She notes that there is about a 20+ pound weight gain from when she went into the hospital to when she was discharged.  She took Lasix over the last couple of days to help with this fluid.   ROS: Per HPI  Allergies  Allergen Reactions  . Ketoconazole     REACTION: hives, swelling  . Pravachol [Pravastatin Sodium] Shortness Of Breath, Swelling and Anaphylaxis    Throat swelling  . Tape Dermatitis and Other (See Comments)  . Codeine     REACTION: increased BP  . Statins     REACTION: leg crapms   Past Medical History:  Diagnosis Date  . Allergic rhinitis   . Barrett's esophagus   . Bipolar affective disorder ([place])   . Chronic respiratory failure ([place])   . Constipation, chronic   . Diabetes mellitus without complication ([place])    type 2   . Dry eye   . Gastroparesis   . GERD (gastroesophageal reflux disease)   . History of bariatric surgery 03/2017  . Hyperlipidemia   . Hypertension   . Intractable chronic migraine without aura 06/04/2015  . Migraine headache   . Morbid obesity ([place])   . OSA  (obstructive sleep apnea)    uses a cpap with 2 units o2   . Osteoarthritis    bilateral knee  . Sleep apnea    has c-pap  . Unspecified hypothyroidism   . Vitamin B 12 deficiency   . Vitamin D deficiency     Current Outpatient Medications:  .  AIMOVIG 140 MG/ML SOAJ, INJECT 140MG  (2ML) ONCE A MONTH AS DIRECTED (Patient taking differently: Inject 140 mg into the muscle every 30 (thirty) days. ), Disp: 1 pen, Rfl: 5 .  cefdinir (OMNICEF) 300 MG capsule, Take 1 capsule (300 mg total) by mouth every 12 (twelve) hours for 3 days., Disp: 6 capsule, Rfl: 0 .  diclofenac sodium (VOLTAREN) 1 % GEL, Apply 2 g topically 2 (two) times daily as needed (JOINT PAIN). Use as directed for knee pain, Disp: , Rfl:  .  divalproex (DEPAKOTE) 500 MG DR tablet, TAKE 1 TABLET EVERY MORNING AND 2 TABLETS AT BEDTIME, Disp: 90 tablet, Rfl: 5 .  ezetimibe (ZETIA) 10 MG tablet, Take 1 tablet (10 mg total) by mouth at bedtime., Disp: , Rfl:  .  fluticasone (FLONASE) 50 MCG/ACT nasal spray, Place 2 sprays into both nostrils 2 (two) times daily as needed for allergies., Disp: , Rfl:  .  gabapentin (NEURONTIN) 300 MG capsule, Take 2 capsules (600 mg total) 2 (two) times daily by mouth., Disp: 360 capsule, Rfl: 3 .  glucose blood (  ONE TOUCH ULTRA TEST) test strip, 1 each by Other route 2 (two) times daily as needed. CHECK BLOOD SUGAR TWICE A DAY AS DIRECTED, Disp: , Rfl:  .  levothyroxine (SYNTHROID, LEVOTHROID) 125 MCG tablet, Take 125 mcg by mouth daily before breakfast., Disp: , Rfl:  .  loratadine (CLARITIN) 10 MG tablet, Take 10 mg by mouth daily as needed for allergies. , Disp: , Rfl:  .  Multiple Vitamin (MULTIVITAMIN) capsule, Take 1 capsule by mouth daily. , Disp: , Rfl:  .  nystatin (NYAMYC) powder, APPLY TOPICALLY TWICE A DAY AS NEEDED (Patient taking differently: Apply 1 g topically 2 (two) times daily as needed (yeast). ), Disp: 30 g, Rfl: 0 .  pantoprazole (PROTONIX) 40 MG tablet, Take 1 tablet by mouth daily.  , Disp: , Rfl:  .  PAZEO 0.7 % SOLN, Place 1 drop into both eyes every morning., Disp: , Rfl:  .  polyethylene glycol (MIRALAX / GLYCOLAX) packet, Take 17 g by mouth at bedtime as needed for mild constipation. , Disp: , Rfl:  .  Probiotic Product (HEALTHY COLON PO), Take 1 capsule by mouth 2 (two) times daily. , Disp: , Rfl:  .  RESTASIS MULTIDOSE 0.05 % ophthalmic emulsion, Place 1 drop into both eyes 2 (two) times daily., Disp: , Rfl:  .  sertraline (ZOLOFT) 100 MG tablet, TAKE (2) TABLETS DAILY (Patient taking differently: Take 100 mg by mouth daily. ), Disp: 180 tablet, Rfl: 0 .  tiZANidine (ZANAFLEX) 2 MG tablet, Take 1 tablet (2 mg total) by mouth every 8 (eight) hours as needed for muscle spasms., Disp: , Rfl:  .  traMADol (ULTRAM) 50 MG tablet, Take 50 mg by mouth every 6 (six) hours. , Disp: , Rfl:  .  ziprasidone (GEODON) 60 MG capsule, TAKE 1 CAPSULE IN THE EVENING (Patient taking differently: Take 60 mg by mouth every evening. ), Disp: 30 capsule, Rfl: 5  Current Facility-Administered Medications:  .  cyanocobalamin ((VITAMIN B-12)) injection 1,000 mcg, 1,000 mcg, Intramuscular, Q30 days, Dontrey Snellgrove M, DO, 1,000 mcg at 07/16/18 8144 Social History   Socioeconomic History  . Marital status: Single    Spouse name: Not on file  . Number of children: 0  . Years of education: HS  . Highest education level: 12th grade  Occupational History  . Occupation: disabled    Fish farm manager: UNEMPLOYED  Social Needs  . Financial resource strain: Not hard at all  . Food insecurity:    Worry: Never true    Inability: Never true  . Transportation needs:    Medical: No    Non-medical: No  Tobacco Use  . Smoking status: Never Smoker  . Smokeless tobacco: Never Used  Substance and Sexual Activity  . Alcohol use: No  . Drug use: No  . Sexual activity: Never    Birth control/protection: None  Lifestyle  . Physical activity:    Days per week: 0 days    Minutes per session: 0 min  .  Stress: Not at all  Relationships  . Social connections:    Talks on phone: More than three times a week    Gets together: More than three times a week    Attends religious service: More than 4 times per year    Active member of club or organization: No    Attends meetings of clubs or organizations: Never    Relationship status: Never married  . Intimate partner violence:    Fear of current or ex partner:  No    Emotionally abused: No    Physically abused: No    Forced sexual activity: No  Other Topics Concern  . Not on file  Social History Narrative   Patient is right handed.   Patient drinks 2 glasses of caffeine daily.   Family History  Problem Relation Age of Onset  . Asthma Father   . Allergies Father   . Heart disease Father        enlarged heart  . Peripheral vascular disease Father   . Diabetes Father   . Hyperlipidemia Father   . Arthritis Father   . Asthma Sister   . Cancer Sister        colon at 53 yr old.  . Colon cancer Sister   . Allergies Mother   . Stroke Mother 45       with hemi paralysis  . Diabetes Mother   . Hyperlipidemia Mother   . Hypertension Mother   . GI problems Mother   . Arthritis Mother   . Allergies Brother   . Early death Brother 9       congenital abormality  . Allergies Sister   . Diabetes Sister   . Asthma Sister   . Colon polyps Sister   . Hyperlipidemia Sister   . GI problems Sister        gastroporesis   . Liver disease Sister        fatty liver  . Stroke Sister        intercrandial bleed  . Diabetes Brother   . Hypertension Brother   . Hyperlipidemia Brother   . Heart disease Paternal Aunt   . Migraines Neg Hx     Objective: Office vital signs reviewed. BP 93/62   Pulse 67   Temp 97.9 F (36.6 C) (Oral)   Ht 5\' 4"  (1.626 m)   Wt 152 lb 8 oz (69.2 kg)   LMP 03/17/2017   BMI 26.18 kg/m   Physical Examination:  General: Awake, alert, chronically ill appearing, No acute distress HEENT: sclera white, some  bruising along nasal bridge Pulm: Decreased breath sounds in the right lower lobe.  Otherwise, clear to auscultation bilaterally, no wheezes, rhonchi or rales; normal work of breathing on room air GI: soft, non-tender, non-distended, g-tube in place on left side of abdomen. Extremities: warm, well perfused, mild nonpitting edema. No cyanosis or clubbing; +2 pulses bilaterally  Assessment/ Plan: 52 y.o. female   1. Pneumonia of right lower lobe due to infectious organism [facility]) I reviewed patient's hospital discharge summary and recent lab work.  She has follow-up with her gastroenterologist, cardiologist and endocrinology.  Patient is afebrile nontoxic-appearing.  Her blood pressure is borderline during exam.  I suspect this is because of use of Lasix.  She is otherwise nontoxic-appearing.  Physical exam with right lower lobe decreased breath sounds.  Plan for repeat chest x-ray in about 4 to 6 weeks.  We will check a BMP and CBC today.  I advised her to not use any more Lasix.  She will follow-up with me as scheduled. - Basic Metabolic Panel - CBC with Differential  2. Hospital discharge follow-up - Basic Metabolic Panel - CBC with Differential  3. Thrombocytopenia (HCC) Mild noted on last CBC.  Will recheck today.  Likely down secondary to acute illness.   Orders Placed This Encounter  Procedures  . Basic Metabolic Panel  . CBC with Differential    Janora Norlander, DO [place] [phone])  [phone]   

## 2018-08-01 ENCOUNTER — Ambulatory Visit: Payer: Self-pay | Admitting: Internal Medicine

## 2018-08-02 ENCOUNTER — Other Ambulatory Visit: Payer: Self-pay | Admitting: General Surgery

## 2018-08-02 DIAGNOSIS — Z01419 Encounter for gynecological examination (general) (routine) without abnormal findings: Secondary | ICD-10-CM | POA: Diagnosis not present

## 2018-08-02 DIAGNOSIS — Z9884 Bariatric surgery status: Secondary | ICD-10-CM

## 2018-08-02 DIAGNOSIS — Z1231 Encounter for screening mammogram for malignant neoplasm of breast: Secondary | ICD-10-CM | POA: Diagnosis not present

## 2018-08-15 ENCOUNTER — Other Ambulatory Visit: Payer: Self-pay | Admitting: *Deleted

## 2018-08-15 MED ORDER — ZIPRASIDONE HCL 60 MG PO CAPS: 60.0000 mg | ORAL_CAPSULE | Freq: Every day | ORAL | 2 refills | Status: DC

## 2018-08-17 ENCOUNTER — Ambulatory Visit
Admission: RE | Admit: 2018-08-17 | Discharge: 2018-08-17 | Disposition: A | Discharge status: Home / Self Care | Payer: Medicare Other | Source: Ambulatory Visit | Attending: General Surgery | Admitting: General Surgery

## 2018-08-17 ENCOUNTER — Ambulatory Visit: Payer: Medicare Other

## 2018-08-17 DIAGNOSIS — Z4682 Encounter for fitting and adjustment of non-vascular catheter: Secondary | ICD-10-CM | POA: Diagnosis not present

## 2018-08-17 DIAGNOSIS — K5900 Constipation, unspecified: Secondary | ICD-10-CM | POA: Diagnosis not present

## 2018-08-17 DIAGNOSIS — Z9884 Bariatric surgery status: Secondary | ICD-10-CM

## 2018-08-19 NOTE — Progress Notes (Signed)
Cardiology Office Note   Date:  08/20/2018   ID:  Morgan [name], DOB [birth year], MRN [id number]  PCP:  Janora Norlander, DO  Cardiologist:   Skeet Latch, MD   No chief complaint on file.     History of Present Illness: Morgan [name] is a 52 y.o. female with OSA, hypertension, diabetes, hyperlipidemia, bradycardia, morbid obesity and bipolar disorder who presents for follow-up.  She was initially seen 08/2016 for presurgical clearance prior to bariatric surgery. Ms. Blakeley was seen by Dr. Greer Pickerel on 07/14/16 for consideration of bariatric surgery.  She was referred to cardiology for pre-surgical risk assessnent.  Given her shortness of breath and lack of exercise she was referred for [facility] 09/2016 that revealed breast attenuation artifact and LVEF 61%.  There is no evidence of ischemia.  Ms. Danielsen has struggled with hyperlipidemia. In the past she was treated with several statins but developed myalgias with them all. She has been on Zetia and fenofibrate.  Her pulmonologist told her that she cannot be on fish oil because it will affect her oxygen levels.   She is unsure what her levels are so high, and she has been limiting hydrates and fatty foods. She's been able to lose 30 pounds through diet and exercise but is hoping to get the bariatric surgery to assist in her weight loss.   Ms. Schatz was admitted 07/2018 with pneumonia and septic shock.  She initially presented with dizziness and a fall.  She was found to have a right lower lobe pneumonia.  Her hospitalization was complicated by acute renal failure.  She was noted to have sinus bradycardia so cardiology was consulted.  It was felt that her dizziness was due to sepsis and hypotension rather than bradycardia.  She had no recurrent symptoms once her blood pressure improved.  She had an echocardiogram 07/2018 that revealed LVEF 55 to 60% and grade 1 diastolic dysfunction.  Since being discharged she has been  feeling well.  Her main complaint is migraine headaches that have been almost daily.  Prior to her fall her headaches were well-controlled.  However since then she has had them almost daily.  She has follow-up scheduled with her neurologist.  She has been checking her blood pressures.  Initially after leaving the hospital her blood pressure was as low as the 60s over 40s.  Since that time it has improved and it is averaging around 100/70.  She denies any syncope or presyncope.  Her heart rate has been in the 60s to 70s.  She is trying to get exercise by walking.  In the past she liked to do water aerobics but cannot do that because she has a feeding tube.  She has no lower extremity edema, orthopnea, or PND.  She feels well with exercise.   Past Medical History:  Diagnosis Date  . Allergic rhinitis   . Barrett's esophagus   . Bipolar affective disorder ([place])   . Chronic respiratory failure ([place])   . Constipation, chronic   . Diabetes mellitus without complication ([place])    type 2   . Dry eye   . Gastroparesis   . GERD (gastroesophageal reflux disease)   . History of bariatric surgery 03/2017  . Hyperlipidemia   . Hypertension   . Intractable chronic migraine without aura 06/04/2015  . Migraine headache   . Morbid obesity ([place])   . OSA (obstructive sleep apnea)    uses a cpap with 2 units o2   .  Osteoarthritis    bilateral knee  . Sleep apnea    has c-pap  . Unspecified hypothyroidism   . Vitamin B 12 deficiency   . Vitamin D deficiency     Past Surgical History:  Procedure Laterality Date  . APPENDECTOMY  1978  . CHOLECYSTECTOMY  2005  . GASTRIC ROUX-EN-Y N/A 03/21/2017   Procedure: LAPAROSCOPIC ROUX-EN-Y GASTRIC, UPPER ENDO;  Surgeon: Greer Pickerel, MD;  Location: [facility];  Service: General;  Laterality: N/A;  . GASTROSTOMY N/A 06/19/2018   Procedure: LAPRASCOPIC INSERTION OF GASTROSTOMY TUBE;  Surgeon: Greer Pickerel, MD;  Location: [facility];  Service: General;  Laterality: N/A;  .  HERNIA REPAIR    . HIATAL HERNIA REPAIR N/A 06/19/2018   Procedure: LAPAROSCOPIC REPAIR OF HIATAL HERNIA;  Surgeon: Greer Pickerel, MD;  Location: [facility];  Service: General;  Laterality: N/A;  . LAPAROSCOPY N/A 06/19/2018   Procedure: LAPAROSCOPY DIAGNOSTIC;  Surgeon: Greer Pickerel, MD;  Location: [facility];  Service: General;  Laterality: N/A;  . SHOULDER ARTHROSCOPY  7/12   left-dsc  . TONSILLECTOMY  at age 27  . TRIGGER FINGER RELEASE  12/20/2012   Procedure: RELEASE TRIGGER FINGER/A-1 PULLEY;  Surgeon: Tennis Must, MD;  Location: [facility];  Service: Orthopedics;  Laterality: Left;  LEFT TRIGGER THUMB RELEASE     Current Outpatient Medications  Medication Sig Dispense Refill  . AIMOVIG 140 MG/ML SOAJ INJECT 140MG  (2ML) ONCE A MONTH AS DIRECTED (Patient taking differently: Inject 140 mg into the muscle every 30 (thirty) days. ) 1 pen 5  . diclofenac sodium (VOLTAREN) 1 % GEL Apply 2 g topically 2 (two) times daily as needed (JOINT PAIN). Use as directed for knee pain    . divalproex (DEPAKOTE) 500 MG DR tablet TAKE 1 TABLET EVERY MORNING AND 2 TABLETS AT BEDTIME 90 tablet 5  . ezetimibe (ZETIA) 10 MG tablet Take 1 tablet (10 mg total) by mouth at bedtime.    . fluticasone (FLONASE) 50 MCG/ACT nasal spray Place 2 sprays into both nostrils 2 (two) times daily as needed for allergies.    Marland Kitchen gabapentin (NEURONTIN) 300 MG capsule Take 2 capsules (600 mg total) 2 (two) times daily by mouth. 360 capsule 3  . glucose blood (ONE TOUCH ULTRA TEST) test strip 1 each by Other route 2 (two) times daily as needed. CHECK BLOOD SUGAR TWICE A DAY AS DIRECTED    . levothyroxine (SYNTHROID, LEVOTHROID) 125 MCG tablet Take 125 mcg by mouth daily before breakfast.    . loratadine (CLARITIN) 10 MG tablet Take 10 mg by mouth daily as needed for allergies.     . Multiple Vitamin (MULTIVITAMIN) capsule Take 1 capsule by mouth daily.     Marland Kitchen nystatin (NYAMYC) powder APPLY TOPICALLY TWICE A DAY AS NEEDED (Patient  taking differently: Apply 1 g topically 2 (two) times daily as needed (yeast). ) 30 g 0  . pantoprazole (PROTONIX) 40 MG tablet Take 1 tablet by mouth daily.     Marland Kitchen PAZEO 0.7 % SOLN Place 1 drop into both eyes every morning.    . polyethylene glycol (MIRALAX / GLYCOLAX) packet Take 17 g by mouth at bedtime as needed for mild constipation.     . Probiotic Product (HEALTHY COLON PO) Take 1 capsule by mouth 2 (two) times daily.     . RESTASIS MULTIDOSE 0.05 % ophthalmic emulsion Place 1 drop into both eyes 2 (two) times daily.    . sertraline (ZOLOFT) 100 MG tablet Take 200  mg by mouth daily.    Marland Kitchen tiZANidine (ZANAFLEX) 2 MG tablet Take 1 tablet (2 mg total) by mouth every 8 (eight) hours as needed for muscle spasms.    . traMADol (ULTRAM) 50 MG tablet Take 50 mg by mouth every 6 (six) hours.     . ziprasidone (GEODON) 60 MG capsule Take 1 capsule (60 mg total) by mouth at bedtime. 30 capsule 2   Current Facility-Administered Medications  Medication Dose Route Frequency Provider Last Rate Last Dose  . cyanocobalamin ((VITAMIN B-12)) injection 1,000 mcg  1,000 mcg Intramuscular Q30 days Ronnie Doss M, DO   1,000 mcg at 07/16/18 8657    Allergies:   Ketoconazole; Pravachol [pravastatin sodium]; Tape; Codeine; and Statins    Social History:  The patient  reports that she has never smoked. She has never used smokeless tobacco. She reports that she does not drink alcohol or use drugs.   Family History:  The patient's family history includes Allergies in her brother, father, mother, and sister; Arthritis in her father and mother; Asthma in her father, sister, and sister; Cancer in her sister; Colon cancer in her sister; Colon polyps in her sister; Diabetes in her brother, father, mother, and sister; Early death (age of onset: 22) in her brother; GI problems in her mother and sister; Heart disease in her father and paternal aunt; Hyperlipidemia in her brother, father, mother, and sister; Hypertension  in her brother and mother; Liver disease in her sister; Peripheral vascular disease in her father; Stroke in her sister; Stroke (age of onset: 41) in her mother.    ROS:  Please see the history of present illness.   Otherwise, review of systems are positive for none.   All other systems are reviewed and negative.    PHYSICAL EXAM: VS:  BP 110/70   Pulse (!) 57   Ht 5\' 4"  (1.626 m)   Wt 126 lb (57.2 kg)   LMP 03/17/2017   SpO2 99%   BMI 21.63 kg/m  , BMI Body mass index is 21.63 kg/m. GENERAL:  Well appearing HEENT: Pupils equal round and reactive, fundi not visualized, oral mucosa unremarkable NECK:  No jugular venous distention, waveform within normal limits, carotid upstroke brisk and symmetric, no bruits LUNGS:  Clear to auscultation bilaterally HEART:  RRR.  PMI not displaced or sustained,S1 and S2 within normal limits, no S3, no S4, no clicks, no rubs, no murmurs ABD:  Flat, positive bowel sounds normal in frequency in pitch, no bruits, no rebound, no guarding, no midline pulsatile mass, no hepatomegaly, no splenomegaly.  G-tube in place EXT:  2 plus pulses throughout, no edema, no cyanosis no clubbing SKIN:  No rashes no nodules NEURO:  Cranial nerves II through XII grossly intact, motor grossly intact throughout PSYCH:  Cognitively intact, oriented to person place and time   EKG:  EKG is not ordered today. The ekg ordered today demonstrates Sinus bradycardia. Rate 49 bpm.  Echo 07/23/18: Study Conclusions  - Left ventricle: The cavity size was normal. Wall thickness was   normal. Systolic function was normal. The estimated ejection   fraction was in the range of 55% to 60%. Doppler parameters are   consistent with abnormal left ventricular relaxation (grade 1   diastolic dysfunction). - Left atrium: The atrium was mildly dilated.  Lexiscan Myoview 09/21/16:  The left ventricular ejection fraction is normal (55-65%).  Nuclear stress EF: 61%. No wall motion  abnormalities  There was no ST segment deviation noted during  stress.  Defect 1: There is a small defect of mild severity present in the apical anterior and apical septal location. Possibly breast attenuation artifact.  This is a low risk study. No ischemia identified.The left ventricular ejection fraction is normal (55-65%).  Nuclear stress EF: 61%. No wall motion abnormalities  There was no ST segment deviation noted during stress.  Defect 1: There is a small defect of mild severity present in the apical anterior and apical septal location. Possibly breast attenuation artifact.  This is a low risk study. No ischemia identified.     Recent Labs: 07/20/2018: TSH 0.487 07/22/2018: Magnesium 2.1 07/24/2018: ALT 44 07/27/2018: BUN 15; Creatinine, Ser 0.71; Hemoglobin 10.3; Platelets 289; Potassium 3.9; Sodium 145    Lipid Panel    Component Value Date/Time   CHOL 180 05/10/2018 0829   CHOL 225 (H) 06/18/2013 1009   TRIG 83 06/01/2018 0850   TRIG 259 (H) 08/29/2014 0838   TRIG 314 (H) 06/18/2013 1009   HDL 61 05/10/2018 0829   HDL 39 (L) 08/29/2014 0838   HDL 33 (L) 06/18/2013 1009   CHOLHDL 3.0 05/10/2018 0829   LDLCALC 100 (H) 05/10/2018 0829   LDLCALC 115 (H) 08/29/2014 0838   LDLCALC 129 (H) 06/18/2013 1009   LDLDIRECT 122 (H) 09/29/2016 0839      Wt Readings from Last 3 Encounters:  08/20/18 126 lb (57.2 kg)  07/27/18 152 lb 8 oz (69.2 kg)  07/20/18 125 lb (56.7 kg)      ASSESSMENT AND PLAN:  # Bradycardia: Asymptomatic.  She is no longer bradycardic.  No pacemaker indicated.  Avoid nodal agents.    # Hyperlipidemia: Lipids well-controlled on Zetia.  Her LDL was 100 on 04/2018.   Current medicines are reviewed at length with the patient today.  The patient does not have concerns regarding medicines.  The following changes have been made:  no change  Labs/ tests ordered today include:   No orders of the defined types were placed in this  encounter.    Disposition:   FU with Elma Limas C. Oval Linsey, MD, [facility] as needed.   Signed, [place] Yearby C. Oval Linsey, MD, [facility]  08/20/2018 9:02 AM    [place]

## 2018-08-20 ENCOUNTER — Ambulatory Visit: Payer: Medicare Other | Admitting: Cardiovascular Disease

## 2018-08-20 ENCOUNTER — Encounter: Payer: Self-pay | Admitting: Cardiovascular Disease

## 2018-08-20 ENCOUNTER — Telehealth: Payer: Self-pay | Admitting: Adult Health

## 2018-08-20 VITALS — BP 110/70 | HR 57 | Ht 64.0 in | Wt 126.0 lb

## 2018-08-20 DIAGNOSIS — R001 Bradycardia, unspecified: Secondary | ICD-10-CM | POA: Diagnosis not present

## 2018-08-20 DIAGNOSIS — E7800 Pure hypercholesterolemia, unspecified: Secondary | ICD-10-CM

## 2018-08-20 NOTE — Telephone Encounter (Signed)
Spoke with patient who stated after her fall she was in hospital several days. She stated the Aimovig was helping headaches until she fell. She stated that she had several scans in hospital, results normal. The tingling is a new symptom. She has tried Tylenol with her Aimovig and Depakote as prescribed. Tylenol gives no relief. She cannot take NSAIDS due to gastric surgery. She is asking what other medication she can take for her daily headaches.  Advised her will discuss with NP and call her back. She verbalized understanding, appreciation.

## 2018-08-20 NOTE — Telephone Encounter (Signed)
I called the patient.  In August she had several falls.  She states that her sugar dropped and that was the reason for the fall.  She was hospitalized and then developed pneumonia.  She states that since the fall she has had a daily headache.  He took Aimovig September 5.  However she is continued to have daily headaches.  In the past she has tried multiple oral medications without benefit.  I advised that I would consult with Dr. Jaynee Eagles and see if she had any recommendations.

## 2018-08-20 NOTE — Patient Instructions (Signed)
Medication Instructions:  Your physician recommends that you continue on your current medications as directed. Please refer to the Current Medication list given to you today.   Labwork: None ordered  Testing/Procedures: None ordered  Follow-Up: Your physician wants you to follow-up AS NEEDED  Any Other Special Instructions Will Be Listed Below (If Applicable).     If you need a refill on your cardiac medications before your next appointment, please call your pharmacy.   

## 2018-08-20 NOTE — Telephone Encounter (Signed)
Pt called stating she had a fall on 8/9 and everyday since she has had an intense headache. Stating that medication AIMOVIG 140 MG/ML SOAJ had been helping until the fall. Pt states that both arms are tingling and the headaches are non stop. Requesting a call to advise

## 2018-08-21 ENCOUNTER — Ambulatory Visit (INDEPENDENT_AMBULATORY_CARE_PROVIDER_SITE_OTHER): Payer: Medicare Other | Admitting: *Deleted

## 2018-08-21 DIAGNOSIS — E538 Deficiency of other specified B group vitamins: Secondary | ICD-10-CM

## 2018-08-21 NOTE — Progress Notes (Signed)
Pt given Cyanocobalamin inj Tolerated well 

## 2018-08-22 MED ORDER — GABAPENTIN 300 MG PO CAPS: ORAL_CAPSULE | ORAL | 3 refills | Status: DC

## 2018-08-22 NOTE — Telephone Encounter (Signed)
I called patient back.  She continues to have a headache.  She has tried multiple oral medications in the past without benefit.  She continues on gabapentin and Depakote.  She is unable to do NSAIDs or prednisone due to recommendations from her GI physician.  For now we will increase gabapentin 600 mg in the morning, 300 mg at lunch and 600 mg at bedtime.  If this is not beneficial for her headaches she will call and let us know.

## 2018-08-22 NOTE — Addendum Note (Signed)
Addended by: Trudie Buckler on: 08/22/2018 05:01 PM   Modules accepted: Orders

## 2018-08-27 MED ORDER — PREDNISONE 5 MG PO TABS: ORAL_TABLET | ORAL | 0 refills | Status: DC

## 2018-08-27 NOTE — Telephone Encounter (Signed)
I reviewed fax from Dr. Dois Davenport  office.  A prednisone Dosepak will be ordered for the patient.

## 2018-08-27 NOTE — Telephone Encounter (Signed)
Spoke with patient to advise her this RN needs to check with her GI physician. The patient stated that their office called her. This RN advised NP needs to know from her dr's office, and this RN asked her for dr's name. The patient stated it is Dr Greer Pickerel.  Called Dr Greer Pickerel, [phone], and spoke with Naoma Diener. Sunday Spillers stated she sent an office message to Dr Redmond Pulling herself. He stated she may not have any NSAIDs but this far out form her surgery, she may have prednisone x 1 week. Sunday Spillers will fax the note to the NP.  3:56 pm Received fax from Dr Ok Anis, given to NP for review.

## 2018-08-27 NOTE — Telephone Encounter (Signed)
Pt has called back to inform NP Megan that the increase in her medication has not helped. Pt states that she has checked with her weight loss surgeon and was given the okay to be on steroids for a week.  Please call

## 2018-08-27 NOTE — Telephone Encounter (Signed)
Spoke with patient and informed her that NP reviewed note we received form Dr Redmond Pulling. Advised her the prednisone dose pack Rx has been sent in. She asked if she should continue taking extra gabapentin at lunchtime. This RN advised she may. She verbalized understanding, appreciation.

## 2018-08-27 NOTE — Telephone Encounter (Signed)
We need to get confirmation from her weight loss MD.

## 2018-08-27 NOTE — Addendum Note (Signed)
Addended by: Trudie Buckler on: 08/27/2018 04:01 PM   Modules accepted: Orders

## 2018-09-03 ENCOUNTER — Telehealth: Payer: Self-pay | Admitting: *Deleted

## 2018-09-03 NOTE — Telephone Encounter (Signed)
Fax from [facility] Levothyroxine Historical med Please verify dose 125 mcg or 150 mcg

## 2018-09-04 ENCOUNTER — Other Ambulatory Visit: Payer: Self-pay | Admitting: Family Medicine

## 2018-09-04 ENCOUNTER — Telehealth: Payer: Self-pay | Admitting: Skilled Nursing Facility1

## 2018-09-04 DIAGNOSIS — K289 Gastrojejunal ulcer, unspecified as acute or chronic, without hemorrhage or perforation: Secondary | ICD-10-CM | POA: Diagnosis not present

## 2018-09-04 DIAGNOSIS — Z9884 Bariatric surgery status: Secondary | ICD-10-CM | POA: Diagnosis not present

## 2018-09-04 DIAGNOSIS — E440 Moderate protein-calorie malnutrition: Secondary | ICD-10-CM | POA: Diagnosis not present

## 2018-09-04 MED ORDER — LEVOTHYROXINE SODIUM 125 MCG PO TABS: 125.0000 ug | ORAL_TABLET | Freq: Every day | ORAL | 0 refills | Status: DC

## 2018-09-04 NOTE — Telephone Encounter (Signed)
I have not seen patient ever for thyroid disorder.  It appears that previous PCP saw her for this in 09/2017.  Last TSH obtained while hospitalized.  It appears she was on synthroid 127mcg at that time.  I will refill that strength.

## 2018-09-04 NOTE — Telephone Encounter (Signed)
Pt states she had a feeding tube put in July 10th due to malnourishment. Pt states she did have a hernia that has been fixed but she is still experiencing pain in her abdomen. Pt states she did not want her feeding tube and has not used it and refuses to use it as long as she can pt food in her mouth and states she used it once making her not want to eat for hours.   Pt states she has been trying to eat every 2 hours even though it hurts her. Pt states she has drank 1 protein shake every day.   Dietitian advised she come in for a visit. Dietitian tried to get pt in as soon as possible but pt refused several date options and refused to work with another dietitian.

## 2018-09-06 ENCOUNTER — Telehealth: Payer: Self-pay | Admitting: Adult Health

## 2018-09-06 NOTE — Telephone Encounter (Signed)
Called patient and advised her that the NP wants to see her sooner to discuss headaches.  Rescheduled her 10/22/18 FU for 09/27/18, 10:30 am.  She verbalized understanding, appreciation.

## 2018-09-06 NOTE — Telephone Encounter (Addendum)
Called patient who stated she still is having headaches and pounding but not as many sharp pains. Some headaches haven't lasted as long as others. She doesn't wake with a headache but "it comes on as the day goes by". She is taking depakote and gabapentin as prescribed. This RN advised will discuss with NP and let her know recommendations. She verbalized understanding, appreciation.

## 2018-09-06 NOTE — Telephone Encounter (Signed)
Pt states she finished her steroids up on Sunday with no relief.  Pt is asking for a call to discuss next plan of action

## 2018-09-06 NOTE — Telephone Encounter (Signed)
Please schedule f/u appt to discuss.

## 2018-09-10 ENCOUNTER — Ambulatory Visit: Payer: Medicare Other | Admitting: Family Medicine

## 2018-09-11 ENCOUNTER — Ambulatory Visit (HOSPITAL_COMMUNITY)
Admission: RE | Admit: 2018-09-11 | Discharge: 2018-09-11 | Disposition: A | Discharge status: Home / Self Care | Payer: Medicare Other | Source: Ambulatory Visit | Attending: Family Medicine | Admitting: Family Medicine

## 2018-09-11 ENCOUNTER — Encounter: Payer: Self-pay | Admitting: Family Medicine

## 2018-09-11 ENCOUNTER — Ambulatory Visit (INDEPENDENT_AMBULATORY_CARE_PROVIDER_SITE_OTHER): Payer: Medicare Other | Admitting: Family Medicine

## 2018-09-11 ENCOUNTER — Other Ambulatory Visit: Payer: Self-pay | Admitting: Family Medicine

## 2018-09-11 ENCOUNTER — Ambulatory Visit (INDEPENDENT_AMBULATORY_CARE_PROVIDER_SITE_OTHER): Payer: Medicare Other

## 2018-09-11 VITALS — BP 111/70 | HR 61 | Temp 97.5°F | Ht 64.0 in | Wt 122.0 lb

## 2018-09-11 DIAGNOSIS — R918 Other nonspecific abnormal finding of lung field: Secondary | ICD-10-CM | POA: Insufficient documentation

## 2018-09-11 DIAGNOSIS — I152 Hypertension secondary to endocrine disorders: Secondary | ICD-10-CM

## 2018-09-11 DIAGNOSIS — Z23 Encounter for immunization: Secondary | ICD-10-CM | POA: Diagnosis not present

## 2018-09-11 DIAGNOSIS — I10 Essential (primary) hypertension: Secondary | ICD-10-CM | POA: Diagnosis not present

## 2018-09-11 DIAGNOSIS — G4733 Obstructive sleep apnea (adult) (pediatric): Secondary | ICD-10-CM

## 2018-09-11 DIAGNOSIS — E039 Hypothyroidism, unspecified: Secondary | ICD-10-CM | POA: Diagnosis not present

## 2018-09-11 DIAGNOSIS — J189 Pneumonia, unspecified organism: Secondary | ICD-10-CM

## 2018-09-11 DIAGNOSIS — J181 Lobar pneumonia, unspecified organism: Secondary | ICD-10-CM

## 2018-09-11 DIAGNOSIS — I2510 Atherosclerotic heart disease of native coronary artery without angina pectoris: Secondary | ICD-10-CM | POA: Insufficient documentation

## 2018-09-11 DIAGNOSIS — E1159 Type 2 diabetes mellitus with other circulatory complications: Secondary | ICD-10-CM

## 2018-09-11 DIAGNOSIS — Z01818 Encounter for other preprocedural examination: Secondary | ICD-10-CM

## 2018-09-11 DIAGNOSIS — E119 Type 2 diabetes mellitus without complications: Secondary | ICD-10-CM

## 2018-09-11 LAB — BAYER DCA HB A1C WAIVED: HB A1C (BAYER DCA - WAIVED): 5 % (ref <7.0)

## 2018-09-11 MED ORDER — IOHEXOL 300 MG/ML  SOLN
75.0000 mL | Freq: Once | INTRAMUSCULAR | Status: AC | PRN
  Administered 2018-09-11: 75 mL via INTRAVENOUS

## 2018-09-11 NOTE — Addendum Note (Signed)
Addended by: Janora Norlander on: 09/11/2018 09:16 AM   Modules accepted: Orders

## 2018-09-11 NOTE — Progress Notes (Addendum)
Subjective: CC: DM, HTN and preop exam PCP: Morgan Norlander, DO WEX:HBZJI [name] is a 52 y.o. female presenting to clinic today for:  1. Type 2 Diabetes:  Patient reports: High at home: 131; Low at home: 75, Taking medication(s): NONE since bypass surgery.  Has had some lows and is seeing endocrinology this Thursday for it  Last flu, zoster and/or pneumovax: needs Flu shot  ROS: denies LOC, polyuria, polydipsia, foot ulcerations,  or chest pain.  She has numbness or tingling in RLE related to her back.  No N/T in LLE.  No visual disturbance.  2. Hypertension Patient reports BP is diet controlled since bypass surgery. Denies headache, dizziness, visual changes, nausea, vomiting, chest pain, LE swelling,or shortness of breath. She has chronic abdominal pain that is being worked up by her GI specialist.  She is expected to have a study for gastric motility soon.  Recently placed on Creon last week.  3. Hypothyroidism Reports compliance with Synthroid.  Has been losing weight after bypass but otherwise asymptomatic.  Has appt w/ Endocrinology this week.  4. Preop exam Pt is a 52 y.o. female who is here for preoperative clearance for lumbar laminectomy/ foraminectomy w/ Dr Ralene Ok.  Date TBD.  She reports she has been cleared by her gastric surgeon.  1) High Risk Cardiac Conditions  1) Recent MI - No.  2) Decompensated Heart Failure - No.  3) Unstable angina - No.  4) Symptomatic arrythmia - No.  5) Sx Valvular Disease - No.  2) Intermediate Risk Factors - DM - Yes (but diet controlled, in remission after gastric bypass).    2) Functional Status - > 4 mets (Walk, run, climb stairs) Yes.  Rob Hickman Activity Status Index: 18.7  3) Surgery Specific Risk -  Intermediate (Carotid, Head and Neck, Orthopaedic )     4) Further Noninvasive evaluation -   1) EKG - No.   1) Hx of CVA, CAD, DM, CKD  2) Echo - No.   1) Worsening dyspnea   3) Stress Testing - Active Cardiac Disease -  No.  5) Need for medical therapy - Beta Blocker, Statins indicated ? No.   ROS: Per HPI  Allergies  Allergen Reactions  . Ketoconazole     REACTION: hives, swelling  . Pravachol [Pravastatin Sodium] Shortness Of Breath, Swelling and Anaphylaxis    Throat swelling  . Tape Dermatitis and Other (See Comments)  . Codeine     REACTION: increased BP  . Statins     REACTION: leg crapms   Past Medical History:  Diagnosis Date  . Allergic rhinitis   . Barrett's esophagus   . Bipolar affective disorder ([place])   . Chronic respiratory failure ([place])   . Constipation, chronic   . Diabetes mellitus without complication ([place])    type 2   . Dry eye   . Gastroparesis   . GERD (gastroesophageal reflux disease)   . History of bariatric surgery 03/2017  . Hyperlipidemia   . Hypertension   . Intractable chronic migraine without aura 06/04/2015  . Migraine headache   . Morbid obesity ([place])   . OSA (obstructive sleep apnea)    uses a cpap with 2 units o2   . Osteoarthritis    bilateral knee  . Sleep apnea    has c-pap  . Unspecified hypothyroidism   . Vitamin B 12 deficiency   . Vitamin D deficiency     Current Outpatient Medications:  .  AIMOVIG  140 MG/ML SOAJ, INJECT 140MG  (2ML) ONCE A MONTH AS DIRECTED (Patient taking differently: Inject 140 mg into the muscle every 30 (thirty) days. ), Disp: 1 pen, Rfl: 5 .  diclofenac sodium (VOLTAREN) 1 % GEL, Apply 2 g topically 2 (two) times daily as needed (JOINT PAIN). Use as directed for knee pain, Disp: , Rfl:  .  divalproex (DEPAKOTE) 500 MG DR tablet, TAKE 1 TABLET EVERY MORNING AND 2 TABLETS AT BEDTIME, Disp: 90 tablet, Rfl: 5 .  ezetimibe (ZETIA) 10 MG tablet, Take 1 tablet (10 mg total) by mouth at bedtime., Disp: , Rfl:  .  fluticasone (FLONASE) 50 MCG/ACT nasal spray, Place 2 sprays into both nostrils 2 (two) times daily as needed for allergies., Disp: , Rfl:  .  gabapentin (NEURONTIN) 300 MG capsule, Take 2 tabs PO in the AM, 1 tab at  lunch and 2 tabs at bedtime., Disp: 450 capsule, Rfl: 3 .  glucose blood (ONE TOUCH ULTRA TEST) test strip, 1 each by Other route 2 (two) times daily as needed. CHECK BLOOD SUGAR TWICE A DAY AS DIRECTED, Disp: , Rfl:  .  levothyroxine (SYNTHROID, LEVOTHROID) 125 MCG tablet, Take 1 tablet (125 mcg total) by mouth daily before breakfast., Disp: 90 tablet, Rfl: 0 .  lipase/protease/amylase (CREON) 12000 units CPEP capsule, Take by mouth 3 (three) times daily with meals., Disp: , Rfl:  .  loratadine (CLARITIN) 10 MG tablet, Take 10 mg by mouth daily as needed for allergies. , Disp: , Rfl:  .  Multiple Vitamin (MULTIVITAMIN) capsule, Take 1 capsule by mouth daily. , Disp: , Rfl:  .  nystatin (NYAMYC) powder, APPLY TOPICALLY TWICE A DAY AS NEEDED (Patient taking differently: Apply 1 g topically 2 (two) times daily as needed (yeast). ), Disp: 30 g, Rfl: 0 .  pantoprazole (PROTONIX) 40 MG tablet, Take 1 tablet by mouth daily. , Disp: , Rfl:  .  PAZEO 0.7 % SOLN, Place 1 drop into both eyes every morning., Disp: , Rfl:  .  polyethylene glycol (MIRALAX / GLYCOLAX) packet, Take 17 g by mouth at bedtime as needed for mild constipation. , Disp: , Rfl:  .  Probiotic Product (HEALTHY COLON PO), Take 1 capsule by mouth 2 (two) times daily. , Disp: , Rfl:  .  RESTASIS MULTIDOSE 0.05 % ophthalmic emulsion, Place 1 drop into both eyes 2 (two) times daily., Disp: , Rfl:  .  sertraline (ZOLOFT) 100 MG tablet, Take 200 mg by mouth daily., Disp: , Rfl:  .  tiZANidine (ZANAFLEX) 2 MG tablet, Take 1 tablet (2 mg total) by mouth every 8 (eight) hours as needed for muscle spasms., Disp: , Rfl:  .  traMADol (ULTRAM) 50 MG tablet, Take 50 mg by mouth every 6 (six) hours. , Disp: , Rfl:  .  ziprasidone (GEODON) 60 MG capsule, Take 1 capsule (60 mg total) by mouth at bedtime., Disp: 30 capsule, Rfl: 2  Current Facility-Administered Medications:  .  cyanocobalamin ((VITAMIN B-12)) injection 1,000 mcg, 1,000 mcg, Intramuscular,  Q30 days, Ronnie Doss M, DO, 1,000 mcg at 08/21/18 2458   Social History   Socioeconomic History  . Marital status: Single    Spouse name: Not on file  . Number of children: 0  . Years of education: HS  . Highest education level: 12th grade  Occupational History  . Occupation: disabled    Fish farm manager: UNEMPLOYED  Social Needs  . Financial resource strain: Not hard at all  . Food insecurity:    Worry:  Never true    Inability: Never true  . Transportation needs:    Medical: No    Non-medical: No  Tobacco Use  . Smoking status: Never Smoker  . Smokeless tobacco: Never Used  Substance and Sexual Activity  . Alcohol use: No  . Drug use: No  . Sexual activity: Never    Birth control/protection: None  Lifestyle  . Physical activity:    Days per week: 0 days    Minutes per session: 0 min  . Stress: Not at all  Relationships  . Social connections:    Talks on phone: More than three times a week    Gets together: More than three times a week    Attends religious service: More than 4 times per year    Active member of club or organization: No    Attends meetings of clubs or organizations: Never    Relationship status: Never married  . Intimate partner violence:    Fear of current or ex partner: No    Emotionally abused: No    Physically abused: No    Forced sexual activity: No  Other Topics Concern  . Not on file  Social History Narrative   Patient is right handed.   Patient drinks 2 glasses of caffeine daily.   Family History  Problem Relation Age of Onset  . Asthma Father   . Allergies Father   . Heart disease Father        enlarged heart  . Peripheral vascular disease Father   . Diabetes Father   . Hyperlipidemia Father   . Arthritis Father   . Asthma Sister   . Cancer Sister        colon at 38 yr old.  . Colon cancer Sister   . Allergies Mother   . Stroke Mother 33       with hemi paralysis  . Diabetes Mother   . Hyperlipidemia Mother   .  Hypertension Mother   . GI problems Mother   . Arthritis Mother   . Allergies Brother   . Early death Brother 9       congenital abormality  . Allergies Sister   . Diabetes Sister   . Asthma Sister   . Colon polyps Sister   . Hyperlipidemia Sister   . GI problems Sister        gastroporesis   . Liver disease Sister        fatty liver  . Stroke Sister        intercrandial bleed  . Diabetes Brother   . Hypertension Brother   . Hyperlipidemia Brother   . Heart disease Paternal Aunt   . Migraines Neg Hx     Objective: Office vital signs reviewed. BP 111/70   Pulse 61   Temp (!) 97.5 F (36.4 C) (Oral)   Ht 5\' 4"  (1.626 m)   Wt 122 lb (55.3 kg)   LMP 03/17/2017   SpO2 98%   BMI 20.94 kg/m   Physical Examination:  General: Awake, alert, thin, No acute distress HEENT: Normal    Neck: has full AROM of C-spine.    Ears: Tympanic membranes intact, normal light reflex, no erythema, no bulging    Eyes: PERRLA, extraocular membranes intact, sclera white    Throat: moist mucus membranes, no erythema, no tonsillar exudate.  Airway is patent.  Mallampati 3. Cardio: regular rate and rhythm, S1S2 heard, no murmurs appreciated Pulm: clear to auscultation bilaterally, no wheezes, rhonchi or rales;  normal work of breathing on room air Extremities: warm, well perfused, No edema, cyanosis or clubbing; +2 pulses bilaterally  Dg Chest 2 View  Result Date: 09/11/2018 CLINICAL DATA:  Preoperative examination prior to back surgery. Follow-up of right lower lobe pneumonia EXAM: CHEST - 2 VIEW COMPARISON:  CT scan of the chest of July 23, 2018 and chest x-ray of July 22, 2018 and March 17, 2017. FINDINGS: Both lungs are well expanded now. The pleural effusions have resolved. There is persistent patchy density in the right lower lung overlying the posterior aspect of the ninth rib. The left lung is clear. The heart and mediastinal structures are normal. The thoracic spine exhibits mild  multilevel degenerative disc disease. IMPRESSION: Near total clearing of the right lung. Persistent patchy density is present visible best on the frontal view in the lower right lung. Chest CT scanning is recommended prior to surgery in an effort to exclude occult malignancy. No CHF. These results will be called to the ordering clinician or representative by the Radiologist Assistant, and communication documented in the PACS or zVision Dashboard. Electronically Signed   By: David  Martinique M.D.   On: 09/11/2018 09:03    Assessment/ Plan: 52 y.o. female   1. Type 2 diabetes mellitus without complication, without long-term current use of insulin ([place]) Diabetes under excellent control and I would consider this to be in remission after bypass surgery.  Her A1c today was 5.0.  Lowest blood sugar at home has been 75 and she was asymptomatic.  She is to see the endocrinologist later in the week for low blood sugars that she had while hospitalized for pneumonia. - Morgan [name]  2. Hypertension associated with diabetes ([place]) Under excellent control.  This is diet controlled.  She is not on any medications for hypertension. - Basic Metabolic Panel  3. Hypothyroidism, unspecified type Follow-up with endocrinology as scheduled.  4. Preop examination I have independently evaluated patient.  Morgan [name] is a 52 y.o. female who is low-intermediate risk (diabetes, OSA) for an intermediate risk surgery.  There are not modifiable risk factors (smoking, etc).  Morgan [name]'s RCRI calculation for MACE is: 0 (3.9%).    5. Obstructive sleep apnea Per patient she no longer has OSA after losing weight from bypass surgery2.  6. Pneumonia of right lower lobe due to infectious organism [facility]) We will recheck chest x-ray given recent right lower lobe pneumonia and hospitalization. - DG Chest 2 View; Future  7. Need for influenza vaccination Flu shot administered today.  8. Right lower lobe  consolidation ([place]) Attempted to contact the patient with results of persistent right lower lobe consolidation.  Overall, lung fields look to be improved but there is a consolidation of concern that has not totally resolved.  Will obtain CT chest to further evaluate prior to surgery per recommendations.  - CT Chest W Contrast; Future   Orders Placed This Encounter  Procedures  . DG Chest 2 View    Standing Status:   Future    Number of Occurrences:   1    Standing Expiration Date:   11/12/2019    Order Specific Question:   Reason for Exam (SYMPTOM  OR DIAGNOSIS REQUIRED)    Answer:   follow up pneumonia/ preop evaluation    Order Specific Question:   Is patient pregnant?    Answer:   No    Order Specific Question:   Preferred imaging location?    Answer:   Internal  Order Specific Question:   Radiology Contrast Protocol - do NOT remove file path    Answer:   [email or web address]  . Basic Metabolic Panel  . Morgan [facility] Hb A1c [name]      [place], [address] [phone]

## 2018-09-12 LAB — BASIC METABOLIC PANEL
BUN / CREAT RATIO: 38 — AB (ref 9–23)
BUN: 21 mg/dL (ref 6–24)
CHLORIDE: 106 mmol/L (ref 96–106)
CO2: 27 mmol/L (ref 20–29)
Calcium: 9.3 mg/dL (ref 8.7–10.2)
Creatinine, Ser: 0.55 mg/dL — ABNORMAL LOW (ref 0.57–1.00)
GFR calc non Af Amer: 109 mL/min/{1.73_m2} (ref >59)
GFR, EST AFRICAN AMERICAN: 126 mL/min/{1.73_m2} (ref >59)
Glucose: 82 mg/dL (ref 65–99)
POTASSIUM: 4.9 mmol/L (ref 3.5–5.2)
Sodium: 147 mmol/L — ABNORMAL HIGH (ref 134–144)

## 2018-09-13 ENCOUNTER — Ambulatory Visit (INDEPENDENT_AMBULATORY_CARE_PROVIDER_SITE_OTHER): Payer: Medicare Other | Admitting: Endocrinology

## 2018-09-13 ENCOUNTER — Encounter: Payer: Self-pay | Admitting: Endocrinology

## 2018-09-13 ENCOUNTER — Other Ambulatory Visit: Payer: Self-pay | Admitting: Neurosurgery

## 2018-09-13 ENCOUNTER — Other Ambulatory Visit: Payer: Self-pay | Admitting: *Deleted

## 2018-09-13 VITALS — BP 94/60 | HR 64 | Ht 64.0 in | Wt 125.0 lb

## 2018-09-13 DIAGNOSIS — E039 Hypothyroidism, unspecified: Secondary | ICD-10-CM

## 2018-09-13 DIAGNOSIS — R634 Abnormal weight loss: Secondary | ICD-10-CM

## 2018-09-13 LAB — TSH: TSH: 0.47 u[IU]/mL (ref 0.35–4.50)

## 2018-09-13 LAB — T4, FREE: FREE T4: 0.8 ng/dL (ref 0.60–1.60)

## 2018-09-13 NOTE — Progress Notes (Signed)
Patient ID: Morgan [name], female   DOB: July 02, 1966, 52 y.o.   MRN: [id number]          Reason for Appointment: Endocrinology consultation  Referring physician:   History of Present Illness:          Date of diagnosis of type 2 diabetes mellitus:   2014      Background history:  She was treated with metformin until she had a gastric bypass surgery Her highest A1c on record is 7.1  Recent history:   Most recent A1c is done on 09/11/2018 is 5.0   Current management, blood sugar patterns and problems identified:  She has been referred here because of an episode of hypoglycemia in August requiring hospitalization, also however  at that time she was acutely ill with septic shock.  At that time she was not on any diabetes medications  She was also having issues with progressive weight loss and poor nutrition from decreased appetite and abdominal pain  More recently however with frequent small meals she has been able to keep her food down she says that she is afraid of her blood sugars getting low and she checks her blood sugars 2 or 3 times a day  She has not had any hypoglycemia since she has been home and lowest blood sugar has been 74  She is followed by a nutritionist  Currently trying to eat frequent small meals every 2-3 hours although not always after 6 PM; she has been given a feeding tube because of her poor nutrition but is not using this    Typical meal intake: Breakfast is toast, bacon and eggs usually getting some protein at each meal                Glucose monitoring:  done 2-3 times a day         Glucometer: One Touch .       Blood Glucose readings by time of day and averages from meter download:  PREMEAL Breakfast Lunch Dinner Bedtime  Overall   Glucose range:  74-103      Median:  94     100+/-20   POST-MEAL PC Breakfast PC Lunch PC Dinner  Glucose range:    88-165  Median:    105    Dietician visit, most recent: 02/2018  Weight history: 309  Wt  Readings from Last 3 Encounters:  09/13/18 125 lb (56.7 kg)  09/11/18 122 lb (55.3 kg)  08/20/18 126 lb (57.2 kg)    Glycemic control:   5.0  Lab Results  Component Value Date   HGBA1C 5.3 06/15/2018   HGBA1C 6.2 (H) 03/07/2017   HGBA1C 7.0 01/04/2016   Lab Results  Component Value Date   MICROALBUR 0.68 06/18/2013   LDLCALC 100 (H) 05/10/2018   CREATININE 0.55 (L) 09/11/2018   Lab Results  Component Value Date   MICRALBCREAT 6.7 07/05/2016    No results found for: FRUCTOSAMINE  Office Visit on 09/11/2018  Component Date Value Ref Range Status  . Glucose 09/11/2018 82  65 - 99 mg/dL Final  . BUN 09/11/2018 21  6 - 24 mg/dL Final  . Creatinine, Ser 09/11/2018 0.55* 0.57 - 1.00 mg/dL Final  . GFR calc non Af Amer 09/11/2018 109  >59 mL/min/1.73 Final  . GFR calc Af Amer 09/11/2018 126  >59 mL/min/1.73 Final  . BUN/Creatinine Ratio 09/11/2018 38* 9 - 23 Final  . Sodium 09/11/2018 147* 134 - 144 mmol/L Final  . Potassium 09/11/2018  4.9  3.5 - 5.2 mmol/L Final  . Chloride 09/11/2018 106  96 - 106 mmol/L Final  . CO2 09/11/2018 27  20 - 29 mmol/L Final  . Calcium 09/11/2018 9.3  8.7 - 10.2 mg/dL Final  . HB A1C (BAYER DCA - WAIVED) 09/11/2018 5.0  <7.0 % Final   Comment:                                       Diabetic Adult            <7.0                                       Healthy Adult        4.3 - 5.7                                                           (DCCT/NGSP) American Diabetes Association's Summary of Glycemic Recommendations for Adults with Diabetes: Hemoglobin A1c <7.0%. More stringent glycemic goals (A1c <6.0%) may further reduce complications at the cost of increased risk of hypoglycemia.     Allergies as of 09/13/2018      Reactions   Ketoconazole    REACTION: hives, swelling   Pravachol [pravastatin Sodium] Shortness Of Breath, Swelling, Anaphylaxis   Throat swelling   Tape Dermatitis, Other (See Comments)   Codeine    REACTION: increased BP    Statins    REACTION: leg crapms      Medication List        Accurate as of 09/13/18 11:31 AM. Always use your most recent med list.          AIMOVIG 140 MG/ML Soaj Generic drug:  Erenumab-aooe INJECT 140MG  (2ML) ONCE A MONTH AS DIRECTED   diclofenac sodium 1 % Gel Commonly known as:  VOLTAREN Apply 2 g topically 2 (two) times daily as needed (JOINT PAIN). Use as directed for knee pain   divalproex 500 MG DR tablet Commonly known as:  DEPAKOTE TAKE 1 TABLET EVERY MORNING AND 2 TABLETS AT BEDTIME   ezetimibe 10 MG tablet Commonly known as:  ZETIA Take 1 tablet (10 mg total) by mouth at bedtime.   fluticasone 50 MCG/ACT nasal spray Commonly known as:  FLONASE Place 2 sprays into both nostrils 2 (two) times daily as needed for allergies.   gabapentin 300 MG capsule Commonly known as:  NEURONTIN Take 2 tabs PO in the AM, 1 tab at lunch and 2 tabs at bedtime.   glucose blood test strip 1 each by Other route 2 (two) times daily as needed. CHECK BLOOD SUGAR TWICE A DAY AS DIRECTED   HEALTHY COLON PO Take 1 capsule by mouth 2 (two) times daily.   levothyroxine 125 MCG tablet Commonly known as:  SYNTHROID, LEVOTHROID Take 1 tablet (125 mcg total) by mouth daily before breakfast.   lipase/protease/amylase 12000 units Cpep capsule Commonly known as:  CREON Take by mouth 3 (three) times daily with meals.   loratadine 10 MG tablet Commonly known as:  CLARITIN Take 10 mg by mouth daily as needed for allergies.   multivitamin capsule Take  1 capsule by mouth daily.   nystatin powder Commonly known as:  MYCOSTATIN/NYSTOP APPLY TOPICALLY TWICE A DAY AS NEEDED   pantoprazole 40 MG tablet Commonly known as:  PROTONIX Take 1 tablet by mouth daily.   PAZEO 0.7 % Soln Generic drug:  Olopatadine HCl Place 1 drop into both eyes every morning.   polyethylene glycol packet Commonly known as:  MIRALAX / GLYCOLAX Take 17 g by mouth at bedtime as needed for mild  constipation.   RESTASIS MULTIDOSE 0.05 % ophthalmic emulsion Generic drug:  cycloSPORINE Place 1 drop into both eyes 2 (two) times daily.   sertraline 100 MG tablet Commonly known as:  ZOLOFT Take 200 mg by mouth daily.   tiZANidine 2 MG tablet Commonly known as:  ZANAFLEX Take 1 tablet (2 mg total) by mouth every 8 (eight) hours as needed for muscle spasms.   traMADol 50 MG tablet Commonly known as:  ULTRAM Take 50 mg by mouth every 6 (six) hours.   ziprasidone 60 MG capsule Commonly known as:  GEODON Take 1 capsule (60 mg total) by mouth at bedtime.       Allergies:  Allergies  Allergen Reactions  . Ketoconazole     REACTION: hives, swelling  . Pravachol [Pravastatin Sodium] Shortness Of Breath, Swelling and Anaphylaxis    Throat swelling  . Tape Dermatitis and Other (See Comments)  . Codeine     REACTION: increased BP  . Statins     REACTION: leg crapms    Past Medical History:  Diagnosis Date  . Allergic rhinitis   . Barrett's esophagus   . Bipolar affective disorder ([place])   . CAP (community acquired pneumonia) 07/20/2018  . Chronic respiratory failure ([place])   . Constipation, chronic   . Diabetes mellitus without complication ([place])    type 2   . Dry eye   . Gastroparesis   . GERD (gastroesophageal reflux disease)   . History of bariatric surgery 03/2017  . Hyperlipidemia   . Hypertension   . Intractable chronic migraine without aura 06/04/2015  . Migraine headache   . Morbid obesity ([place])   . OSA (obstructive sleep apnea)    uses a cpap with 2 units o2   . Osteoarthritis    bilateral knee  . Sleep apnea    has c-pap  . Unspecified hypothyroidism   . Vitamin B 12 deficiency   . Vitamin D deficiency     Past Surgical History:  Procedure Laterality Date  . APPENDECTOMY  1978  . CHOLECYSTECTOMY  2005  . GASTRIC ROUX-EN-Y N/A 03/21/2017   Procedure: LAPAROSCOPIC ROUX-EN-Y GASTRIC, UPPER ENDO;  Surgeon: Greer Pickerel, MD;  Location: [facility];  Service:  General;  Laterality: N/A;  . GASTROSTOMY N/A 06/19/2018   Procedure: LAPRASCOPIC INSERTION OF GASTROSTOMY TUBE;  Surgeon: Greer Pickerel, MD;  Location: [facility];  Service: General;  Laterality: N/A;  . HERNIA REPAIR    . HIATAL HERNIA REPAIR N/A 06/19/2018   Procedure: LAPAROSCOPIC REPAIR OF HIATAL HERNIA;  Surgeon: Greer Pickerel, MD;  Location: [facility];  Service: General;  Laterality: N/A;  . LAPAROSCOPY N/A 06/19/2018   Procedure: LAPAROSCOPY DIAGNOSTIC;  Surgeon: Greer Pickerel, MD;  Location: [facility];  Service: General;  Laterality: N/A;  . SHOULDER ARTHROSCOPY  7/12   left-dsc  . TONSILLECTOMY  at age 63  . TRIGGER FINGER RELEASE  12/20/2012   Procedure: RELEASE TRIGGER FINGER/A-1 PULLEY;  Surgeon: Tennis Must, MD;  Location: [facility];  Service: Orthopedics;  Laterality: Left;  LEFT TRIGGER THUMB RELEASE    Family History  Problem Relation Age of Onset  . Asthma Father   . Allergies Father   . Heart disease Father        enlarged heart  . Peripheral vascular disease Father   . Diabetes Father   . Hyperlipidemia Father   . Arthritis Father   . Asthma Sister   . Cancer Sister        colon at 52 yr old.  . Colon cancer Sister   . Allergies Mother   . Stroke Mother 45       with hemi paralysis  . Diabetes Mother   . Hyperlipidemia Mother   . Hypertension Mother   . GI problems Mother   . Arthritis Mother   . Allergies Brother   . Early death Brother 9       congenital abormality  . Allergies Sister   . Diabetes Sister   . Asthma Sister   . Colon polyps Sister   . Hyperlipidemia Sister   . GI problems Sister        gastroporesis   . Liver disease Sister        fatty liver  . Stroke Sister        intercrandial bleed  . Diabetes Brother   . Hypertension Brother   . Hyperlipidemia Brother   . Heart disease Paternal Aunt   . Migraines Neg Hx     Social History:  reports that she has never smoked. She has never used smokeless tobacco. She reports that she does  not drink alcohol or use drugs.   Review of Systems  Constitutional: Positive for weight loss and reduced appetite.  HENT: Positive for headaches.        History of migraines  Eyes: Negative for blurred vision.  Respiratory: Negative for shortness of breath.   Cardiovascular: Negative for leg swelling.  Gastrointestinal: Positive for nausea and constipation. Negative for diarrhea.  Endocrine: Positive for fatigue.       She has had weakness and fatigue, improving now  Musculoskeletal: Positive for joint pain and back pain.       She has sciatica  Skin: Negative for rash.  Neurological: Positive for weakness.  Psychiatric/Behavioral:       On treatment with Zoloft     Lipid history: Currently not on any statin drugs, taking Zetia    Lab Results  Component Value Date   CHOL 180 05/10/2018   HDL 61 05/10/2018   LDLCALC 100 (H) 05/10/2018   LDLDIRECT 122 (H) 09/29/2016   TRIG 83 06/01/2018   CHOLHDL 3.0 05/10/2018           Hypertension: Has been present  BP 111/73  BP Readings from Last 3 Encounters:  09/13/18 94/60  09/11/18 111/70  08/20/18 110/70    Most recent eye exam was in  Most recent foot exam:  Wt gain max 150  Lab Results  Component Value Date   TSH 0.487 07/20/2018   TSH 0.157 (L) 05/10/2018   TSH 0.632 10/06/2017     LABS:  Office Visit on 09/11/2018  Component Date Value Ref Range Status  . Glucose 09/11/2018 82  65 - 99 mg/dL Final  . BUN 09/11/2018 21  6 - 24 mg/dL Final  . Creatinine, Ser 09/11/2018 0.55* 0.57 - 1.00 mg/dL Final  . GFR calc non Af Amer 09/11/2018 109  >59 mL/min/1.73 Final  . GFR calc Af Amer 09/11/2018 126  >  59 mL/min/1.73 Final  . BUN/Creatinine Ratio 09/11/2018 38* 9 - 23 Final  . Sodium 09/11/2018 147* 134 - 144 mmol/L Final  . Potassium 09/11/2018 4.9  3.5 - 5.2 mmol/L Final  . Chloride 09/11/2018 106  96 - 106 mmol/L Final  . CO2 09/11/2018 27  20 - 29 mmol/L Final  . Calcium 09/11/2018 9.3  8.7 - 10.2 mg/dL  Final  . HB A1C (BAYER DCA - WAIVED) 09/11/2018 5.0  <7.0 % Final   Comment:                                       Diabetic Adult            <7.0                                       Healthy Adult        4.3 - 5.7                                                           (DCCT/NGSP) American Diabetes Association's Summary of Glycemic Recommendations for Adults with Diabetes: Hemoglobin A1c <7.0%. More stringent glycemic goals (A1c <6.0%) may further reduce complications at the cost of increased risk of hypoglycemia.     Physical Examination:  BP 94/60   Pulse 64   Ht 5\' 4"  (1.626 m)   Wt 125 lb (56.7 kg)   LMP 03/17/2017   SpO2 98%   BMI 21.46 kg/m   GENERAL:  Is somewhat asthenic looking She looks pale including mucous membranes  HEENT:         Eye exam shows normal external appearance.  Fundus exam not indicated Oral exam shows normal mucosa .   NECK:   There is no lymphadenopathy  Thyroid is not enlarged and no nodules felt.   Carotids are normal to palpation and no bruit heard  LUNGS:         Chest is symmetrical. Lungs are clear to auscultation.Marland Kitchen   HEART:         Heart sounds:  S1 and S2 are normal. No murmur or click heard., no S3 or S4.   ABDOMEN:   There is no distention present. Liver and spleen are not palpable.  No other mass or tenderness present.    NEUROLOGICAL:   Ankle jerks are 1+ bilaterally.  Biceps reflexes normal           Monofilament sensation appears normal in the toes  MUSCULOSKELETAL:  There is no swelling or deformity of the peripheral joints.     EXTREMITIES:     There is no ankle edema.  SKIN:       No rash or lesions of concern.        ASSESSMENT:  History of hypoglycemia in August related to poor intake and septic shock May have also been having some issues with malabsorption because of her gastric bypass surgery and possible pancreatic insufficiency She has had better nutrition she has not had any hypoglycemia and does not need any  further management She does not know how to eat frequent small meals with  some protein consistently and has not had any blood sugars below 70 at home also  Recent A1c indicates remission of her diabetes  HYPOTHYROIDISM: Details of this are not available and not clear if she truly had hypothyroidism at baseline Because of her significant weight loss and recently relatively low TSH levels this needs to be reassessed to determine her medication dosage   PLAN:    Discussed that with her sugars been quite normal and she is having consistent nutrition she does not need to monitor her blood sugars routinely and only as needed She will be seen as needed in follow-up Thyroid levels will be reviewed and if needing any changes will be contacted     There are no Patient Instructions on file for this visit.   Consultation note has been sent to the referring physician  Elayne Snare 09/13/2018, 11:31 AM   Note: This office note was prepared with Dragon voice recognition system technology. Any transcriptional errors that result from this process are unintentional.

## 2018-09-14 ENCOUNTER — Telehealth: Payer: Self-pay | Admitting: Family Medicine

## 2018-09-14 ENCOUNTER — Telehealth: Payer: Self-pay

## 2018-09-14 MED ORDER — LEVOTHYROXINE SODIUM 112 MCG PO TABS: 112.0000 ug | ORAL_TABLET | Freq: Every day | ORAL | 0 refills | Status: DC

## 2018-09-14 MED ORDER — PANTOPRAZOLE SODIUM 40 MG PO TBEC: 40.0000 mg | DELAYED_RELEASE_TABLET | Freq: Every day | ORAL | 1 refills | Status: DC

## 2018-09-14 NOTE — Telephone Encounter (Signed)
Patient called returning Morgan [name]'s call- I gave her the advice from the note below and informed her a new rx was sent for the new dosage of levothyroxine- patient stated an understanding

## 2018-09-14 NOTE — Telephone Encounter (Signed)
-----   Message from Elayne Snare, MD sent at 09/13/2018  8:49 PM EDT ----- Thyroid level is still high normal, recommend reducing the dose to 112 mcg, needs new prescription.  To follow-up with PCP

## 2018-09-15 NOTE — Telephone Encounter (Signed)
Dose change is appropriate.  Thank you for the information

## 2018-09-19 ENCOUNTER — Encounter: Payer: Medicare Other | Attending: General Surgery | Admitting: Skilled Nursing Facility1

## 2018-09-19 ENCOUNTER — Encounter: Payer: Self-pay | Admitting: Skilled Nursing Facility1

## 2018-09-19 DIAGNOSIS — R109 Unspecified abdominal pain: Secondary | ICD-10-CM | POA: Insufficient documentation

## 2018-09-19 DIAGNOSIS — Z9884 Bariatric surgery status: Secondary | ICD-10-CM | POA: Diagnosis not present

## 2018-09-19 DIAGNOSIS — E119 Type 2 diabetes mellitus without complications: Secondary | ICD-10-CM

## 2018-09-19 DIAGNOSIS — Z713 Dietary counseling and surveillance: Secondary | ICD-10-CM | POA: Diagnosis not present

## 2018-09-19 DIAGNOSIS — R140 Abdominal distension (gaseous): Secondary | ICD-10-CM | POA: Diagnosis not present

## 2018-09-19 DIAGNOSIS — Z6821 Body mass index (BMI) 21.0-21.9, adult: Secondary | ICD-10-CM | POA: Diagnosis not present

## 2018-09-19 NOTE — Patient Instructions (Addendum)
-  Do not eat bread-toasted or otherwise   -For breakfast: 1 egg and 1 sausage with 1 piece of fruit   -Try pureeing 3 ounces of fish, chicken, beef, pork and add sweetened soy milk or whole milk to make it the same consistency as your mashed potatoes     -Try 8 ounces of coffee when you first wake up and 8 ounces of coffee around 1-2pm with protein powder but not too hot

## 2018-09-19 NOTE — Progress Notes (Signed)
RYGB  Pt states he is now taking 1 pill of creon each time she eats and will be tested for gastroparesis on Friday. Pt states she is still experiencing extreme pain as well as feeling overly full when she eats and drinks solids and liquids feeling the same. Pt states she has not had any diarrhea or vomiting but has had nausea. Pt states her small intestine are very slow moving. Pt states she is making herself eat even though it hurts and makes herself eat every 2-3 hours. Pt states she tried birthday cake which caused pain but no other issues. Pt states she has been eating peanut butter and graham crackers at night so she will not have low blood sugars. Pt states her doctor lowered her thyroid medication and she will go back in month to check it again. Pt states she has a lot of gas.  Dietitian advised (again)  pt try a vegetarian diet and pt states she is a meat eater will NOT do a vegetarian diet.  Pt states she is feeling a little better since getting her sugars up. Pt states she has been slightly lightheaded but not dizzy or lightheaded. Pt states she fell so often and hit her head so often her headaches are back. Pt states she sees her neurologist soon. Pt states with miralax she is having a bowel movement every day.   Pt states she has not used her tube feed at all. Pt states she has not used her tube feed because it hurt significantly worse that oral feeds.  Pts labs: Creatinine: .55, BUN 38, Sodium 147, A1C 5, RBR 3.39, Hemoglobin 10.3, hematocrit: 31.8  Pt states she will be having back surgery soon.   Surgery date: 03/21/2017 Surgery type: RYGB Start weight at [facility]: 312.5 Weight today:127.2  TANITA  BODY COMP RESULTS  06/15/2017 09/18/2017 01/22/2018 09/19/2018   BMI (kg/m^2) 37.8 31.8 25.5 21.8   Fat Mass (lbs) 100.2 65.4 44.2 23.8   Fat Free Mass (lbs) 120.2 119.6 104.4 103.4   Total Body Water (lbs) 87 85 72.8 71.2   24-hr recall: B (AM): 1 egg and 1 piece of sausage on a Sandwich  toasted white bread (half the sandwich)  Snack: peanut butter and graham crackers or protein bar  L (PM): chicken or hamburger or mashed potates or beef stew Snk (PM): whole 9 ounce protein shake equate  D (PM): chicken and mashed potatoes or green beans or deer dipped in mustard Snk (PM): 2 peanut butter craham crackers   Fluid intake: water, unsweet tea: 38 ounces a day minimum  Estimated total protein intake: 60  Medications: taking Carafate and protonix  Supplementation: procare health capsule with food and calcium and b12 shot every month  CBG monitoring: 2 times a day Average CBG per patient: 95, 86, 101, 76 Last patient reported A1c: 5.2  Using straws: no Drinking while eating: no Having you been chewing well: yes Chewing/swallowing difficulties: no Changes in vision: no Changes to mood/headaches: no Hair loss/[place]nges to skin/Changes to nails: more hair coming out and thinner  Any difficulty focusing or concentrating: no Sweating: no Dizziness/Lightheaded: no Palpitations: no  Carbonated beverages: no N/V/D/C/GAS: taking miralax daily  Abdominal Pain: no Dumping syndrome: possible  Recent physical activity:  Water aerobics 3 days week 60 minutes walking in the house  Progress Towards Goal(s):  In progress.  Handouts given during visit include:  NS veggies + protein   Nutritional Diagnosis:  [place]-3.3 Overweight/obesity related to past poor dietary  habits and physical inactivity as evidenced by patient w/ recent RYGB surgery following dietary guidelines for continued weight loss.    Intervention:  Nutrition counseling. Dietitian educated the pt on advancing her diet to include ns veggies while having enough room to add more fluid into her day. Goals: -Do not eat bread-toasted or otherwise  -For breakfast: 1 egg and 1 sausage with 1 piece of fruit -Try pureeing 3 ounces of fish, chicken, beef, pork and add sweetened soy milk or whole milk to make it the same  consistency as your mashed potatoes   -Try 8 ounces of coffee when you first wake up and 8 ounces of coffee around 1-2pm with protein powder but not too hot   Teaching Method Utilized:  Visual Auditory Hands on  Barriers to learning/adherence to lifestyle change:abdominal pain  Demonstrated degree of understanding via:  Teach Back   Monitoring/Evaluation:  Dietary intake, exercise, and body weight.

## 2018-09-20 ENCOUNTER — Ambulatory Visit (INDEPENDENT_AMBULATORY_CARE_PROVIDER_SITE_OTHER): Payer: Medicare Other | Admitting: *Deleted

## 2018-09-20 ENCOUNTER — Ambulatory Visit: Payer: Medicare Other

## 2018-09-20 DIAGNOSIS — E538 Deficiency of other specified B group vitamins: Secondary | ICD-10-CM | POA: Diagnosis not present

## 2018-09-20 NOTE — Progress Notes (Signed)
Pt given cyanocobalamin inj Tolerated well 

## 2018-09-21 ENCOUNTER — Ambulatory Visit: Payer: Medicare Other

## 2018-09-25 ENCOUNTER — Other Ambulatory Visit (INDEPENDENT_AMBULATORY_CARE_PROVIDER_SITE_OTHER): Payer: Medicare Other

## 2018-09-25 DIAGNOSIS — J189 Pneumonia, unspecified organism: Secondary | ICD-10-CM

## 2018-09-25 DIAGNOSIS — J181 Lobar pneumonia, unspecified organism: Secondary | ICD-10-CM | POA: Diagnosis not present

## 2018-09-25 DIAGNOSIS — R918 Other nonspecific abnormal finding of lung field: Secondary | ICD-10-CM | POA: Diagnosis not present

## 2018-09-27 ENCOUNTER — Telehealth: Payer: Self-pay | Admitting: Adult Health

## 2018-09-27 ENCOUNTER — Ambulatory Visit: Payer: Medicare Other | Admitting: Adult Health

## 2018-09-27 ENCOUNTER — Encounter: Payer: Self-pay | Admitting: Adult Health

## 2018-09-27 VITALS — BP 109/65 | HR 64 | Ht 64.0 in | Wt 121.0 lb

## 2018-09-27 DIAGNOSIS — G44329 Chronic post-traumatic headache, not intractable: Secondary | ICD-10-CM | POA: Diagnosis not present

## 2018-09-27 DIAGNOSIS — G43009 Migraine without aura, not intractable, without status migrainosus: Secondary | ICD-10-CM

## 2018-09-27 DIAGNOSIS — Z5181 Encounter for therapeutic drug level monitoring: Secondary | ICD-10-CM

## 2018-09-27 NOTE — Telephone Encounter (Signed)
Pt spoke with RN- call dropped pt stated she is unable to come in for a 3 o'clock appt but would like RN to call on her home number to schedule

## 2018-09-27 NOTE — Telephone Encounter (Signed)
Spoke to pt she is aware she also has their number of [phone] to call if she has not heard in the next 2-3 business days.

## 2018-09-27 NOTE — Progress Notes (Signed)
PATIENT: Morgan [name] DOB: 1966-11-18  REASON FOR VISIT: follow up HISTORY FROM: patient  HISTORY OF PRESENT ILLNESS: Today 09/27/18:  Ms. Leisinger is a 52 year old female with a history of migraine headaches.  She returns today for follow-up.  The patient reports initially when she was started on Aimovig her headaches improved.  She states that she had 2 falls in August.  She went to the hospital.  CT scan of the head was unremarkable.  She then developed pneumonia and was in the hospital for several days.  She states that since then she has had daily headaches.  She did see minimal benefit with a steroid Dosepak.  She states that her headaches typically start around midday.  Headache occurs all over her head.  Typically a squeezing pounding sensation.  She reports photophobia and phonophobia as well as nausea.  She continues to have spots in the right eye.  She also reports dizziness with her headache.  She takes tramadol approximately every 6 hours.  If she does not take tramadol she takes Tylenol.  She states that this does not offer her much benefit in regards her headaches.  She had gastric bypass surgery in the past.  She has lost a significant amount of weight.  In July and feeding tube had to be placed because of malnutrition.  She also plans to have back surgery October 31.  She returns today for evaluation.  HISTORY 04/19/18 Ms. Mccaffrey is a 52 year old female with a history of migraine headaches.  She returns today for follow-up.  She is currently on Monday and reports that it has decreased her headache frequency.  She states that she has taken 2 injections.  She reports the first month her headaches decreased from having a daily headache down to 10 days a month.  She reports after the second injection her headaches increased to 8 headache with only four of those being severe.  She states that when she gets a severe headache she can take Tylenol.  The patient did have gastric  bypass.  He has lost over 100 pounds.  She states that she is having some issues with her stomach and is being followed by GI.  She also reports that she has been diagnosed with 2 bulging disks in the lower back.  She is seeing her orthopedist for this.  She returns today for evaluation.  REVIEW OF SYSTEMS: Out of a complete 14 system review of symptoms, the patient complains only of the following symptoms, and all other reviewed systems are negative.  Light sensitivity, blurred vision, abdominal pain, constipation, nausea, joint pain, back pain, neck stiffness, depression, nervous/anxious, headache, dizziness  ALLERGIES: Allergies  Allergen Reactions  . Ketoconazole     REACTION: hives, swelling  . Pravachol [Pravastatin Sodium] Shortness Of Breath, Swelling and Anaphylaxis    Throat swelling  . Tape Dermatitis and Other (See Comments)  . Codeine     increased BP  . Statins     leg crapms    HOME MEDICATIONS: Outpatient Medications Prior to Visit  Medication Sig Dispense Refill  . AIMOVIG 140 MG/ML SOAJ INJECT 140MG  (2ML) ONCE A MONTH AS DIRECTED (Patient taking differently: Inject 140 mg into the muscle every 30 (thirty) days. ) 1 pen 5  . Calcium-Vitamin D-Vitamin K 650-12.5-40 MG-MCG-MCG CHEW Chew 1 tablet by mouth 2 (two) times daily.    . Cyanocobalamin (VITAMIN B-12 IJ) Inject 1,000 mcg as directed every 30 (thirty) days.    . diclofenac sodium (  VOLTAREN) 1 % GEL Apply 2 g topically 2 (two) times daily as needed (JOINT PAIN). Use as directed for knee pain    . divalproex (DEPAKOTE) 500 MG DR tablet TAKE 1 TABLET EVERY MORNING AND 2 TABLETS AT BEDTIME (Patient taking differently: Take 500-1,000 mg by mouth See admin instructions. Take 500mg  by mouth every morning and 1000mg  at bedtime) 90 tablet 5  . ezetimibe (ZETIA) 10 MG tablet Take 1 tablet (10 mg total) by mouth at bedtime.    . fluticasone (FLONASE) 50 MCG/ACT nasal spray Place 2 sprays into both nostrils 2 (two) times daily  as needed for allergies.    Marland Kitchen gabapentin (NEURONTIN) 300 MG capsule Take 2 tabs PO in the AM, 1 tab at lunch and 2 tabs at bedtime. (Patient taking differently: Take 300-600 mg by mouth See admin instructions. Take 600mg  by mouth in the morning, 300mg  at lunch and 600mg  at bedtime.) 450 capsule 3  . glucose blood (ONE TOUCH ULTRA TEST) test strip 1 each by Other route 2 (two) times daily as needed. CHECK BLOOD SUGAR TWICE A DAY AS DIRECTED    . levothyroxine (SYNTHROID, LEVOTHROID) 112 MCG tablet Take 1 tablet (112 mcg total) by mouth daily. (Patient taking differently: Take 112 mcg by mouth daily before breakfast. ) 30 tablet 0  . lipase/protease/amylase (CREON) 36000 UNITS CPEP capsule Take 36,000 Units by mouth 4 (four) times daily.     Marland Kitchen loratadine (CLARITIN) 10 MG tablet Take 10 mg by mouth daily as needed for allergies.     . Multiple Vitamin (MULTIVITAMIN) capsule Take 1 capsule by mouth daily.     Marland Kitchen nystatin (NYAMYC) powder APPLY TOPICALLY TWICE A DAY AS NEEDED (Patient taking differently: Apply 1 g topically 2 (two) times daily as needed (yeast). ) 30 g 0  . pantoprazole (PROTONIX) 40 MG tablet Take 1 tablet (40 mg total) by mouth daily. 90 tablet 1  . PAZEO 0.7 % SOLN Place 1 drop into both eyes every morning.    . polyethylene glycol (MIRALAX / GLYCOLAX) packet Take 17 g by mouth at bedtime as needed for mild constipation.     . RESTASIS MULTIDOSE 0.05 % ophthalmic emulsion Place 1 drop into both eyes 2 (two) times daily.    . sertraline (ZOLOFT) 100 MG tablet Take 200 mg by mouth daily.    Marland Kitchen tiZANidine (ZANAFLEX) 2 MG tablet Take 1 tablet (2 mg total) by mouth every 8 (eight) hours as needed for muscle spasms.    . traMADol (ULTRAM) 50 MG tablet Take 50 mg by mouth every 6 (six) hours.     . ziprasidone (GEODON) 60 MG capsule Take 1 capsule (60 mg total) by mouth at bedtime. 30 capsule 2   Facility-Administered Medications Prior to Visit  Medication Dose Route Frequency Provider Last  Rate Last Dose  . cyanocobalamin ((VITAMIN B-12)) injection 1,000 mcg  1,000 mcg Intramuscular Q30 days Ronnie Doss M, DO   1,000 mcg at 09/20/18 9528    PAST MEDICAL HISTORY: Past Medical History:  Diagnosis Date  . Allergic rhinitis   . Barrett's esophagus   . Bipolar affective disorder ([place])   . CAP (community acquired pneumonia) 07/20/2018  . Chronic respiratory failure ([place])   . Constipation, chronic   . Diabetes mellitus without complication ([place])    type 2   . Dry eye   . Gastroparesis   . GERD (gastroesophageal reflux disease)   . History of bariatric surgery 03/2017  . Hyperlipidemia   .  Hypertension   . Intractable chronic migraine without aura 06/04/2015  . Migraine headache   . Morbid obesity ([place])   . OSA (obstructive sleep apnea)    uses a cpap with 2 units o2   . Osteoarthritis    bilateral knee  . Sleep apnea    has c-pap  . Unspecified hypothyroidism   . Vitamin B 12 deficiency   . Vitamin D deficiency     PAST SURGICAL HISTORY: Past Surgical History:  Procedure Laterality Date  . APPENDECTOMY  1978  . CHOLECYSTECTOMY  2005  . GASTRIC ROUX-EN-Y N/A 03/21/2017   Procedure: LAPAROSCOPIC ROUX-EN-Y GASTRIC, UPPER ENDO;  Surgeon: Greer Pickerel, MD;  Location: [facility];  Service: General;  Laterality: N/A;  . GASTROSTOMY N/A 06/19/2018   Procedure: LAPRASCOPIC INSERTION OF GASTROSTOMY TUBE;  Surgeon: Greer Pickerel, MD;  Location: [facility];  Service: General;  Laterality: N/A;  . HERNIA REPAIR    . HIATAL HERNIA REPAIR N/A 06/19/2018   Procedure: LAPAROSCOPIC REPAIR OF HIATAL HERNIA;  Surgeon: Greer Pickerel, MD;  Location: [facility];  Service: General;  Laterality: N/A;  . LAPAROSCOPY N/A 06/19/2018   Procedure: LAPAROSCOPY DIAGNOSTIC;  Surgeon: Greer Pickerel, MD;  Location: [facility];  Service: General;  Laterality: N/A;  . SHOULDER ARTHROSCOPY  7/12   left-dsc  . TONSILLECTOMY  at age 25  . TRIGGER FINGER RELEASE  12/20/2012   Procedure: RELEASE TRIGGER FINGER/A-1 PULLEY;   Surgeon: Tennis Must, MD;  Location: [facility];  Service: Orthopedics;  Laterality: Left;  LEFT TRIGGER THUMB RELEASE    FAMILY HISTORY: Family History  Problem Relation Age of Onset  . Asthma Father   . Allergies Father   . Heart disease Father        enlarged heart  . Peripheral vascular disease Father   . Diabetes Father   . Hyperlipidemia Father   . Arthritis Father   . Asthma Sister   . Cancer Sister        colon at 34 yr old.  . Colon cancer Sister   . Allergies Mother   . Stroke Mother 49       with hemi paralysis  . Diabetes Mother   . Hyperlipidemia Mother   . Hypertension Mother   . GI problems Mother   . Arthritis Mother   . Allergies Brother   . Early death Brother 9       congenital abormality  . Allergies Sister   . Diabetes Sister   . Asthma Sister   . Colon polyps Sister   . Hyperlipidemia Sister   . GI problems Sister        gastroporesis   . Liver disease Sister        fatty liver  . Stroke Sister        intercrandial bleed  . Diabetes Brother   . Hypertension Brother   . Hyperlipidemia Brother   . Heart disease Paternal Aunt   . Migraines Neg Hx     SOCIAL HISTORY: Social History   Socioeconomic History  . Marital status: Single    Spouse name: Not on file  . Number of children: 0  . Years of education: HS  . Highest education level: 12th grade  Occupational History  . Occupation: disabled    Fish farm manager: UNEMPLOYED  Social Needs  . Financial resource strain: Not hard at all  . Food insecurity:    Worry: Never true    Inability: Never true  . Transportation needs:  Medical: No    Non-medical: No  Tobacco Use  . Smoking status: Never Smoker  . Smokeless tobacco: Never Used  Substance and Sexual Activity  . Alcohol use: No  . Drug use: No  . Sexual activity: Never    Birth control/protection: None  Lifestyle  . Physical activity:    Days per week: 0 days    Minutes per session: 0 min  . Stress: Not at all   Relationships  . Social connections:    Talks on phone: More than three times a week    Gets together: More than three times a week    Attends religious service: More than 4 times per year    Active member of club or organization: No    Attends meetings of clubs or organizations: Never    Relationship status: Never married  . Intimate partner violence:    Fear of current or ex partner: No    Emotionally abused: No    Physically abused: No    Forced sexual activity: No  Other Topics Concern  . Not on file  Social History Narrative   Patient is right handed.   Patient drinks 2 glasses of caffeine daily.      PHYSICAL EXAM  Vitals:   09/27/18 1026  BP: 109/65  Pulse: 64  Weight: 121 lb (54.9 kg)  Height: 5\' 4"  (1.626 m)   Body mass index is 20.77 kg/m.  Generalized: Well developed, in no acute distress   Neurological examination  Mentation: Alert oriented to time, place, history taking. Follows all commands speech and language fluent Cranial nerve II-XII: Pupils were equal round reactive to light. Extraocular movements were full, visual field were full on confrontational test. Facial sensation and strength were normal. Uvula tongue midline. Head turning and shoulder shrug  were normal and symmetric. Motor: The motor testing reveals 5 over 5 strength of all 4 extremities. Good symmetric motor tone is noted throughout.  Sensory: Sensory testing is intact to soft touch on all 4 extremities. No evidence of extinction is noted.  Coordination: Cerebellar testing reveals good finger-nose-finger and heel-to-shin bilaterally.  Gait and station: Gait is slightly wide-based. Reflexes: Deep tendon reflexes are symmetric and normal bilaterally.   DIAGNOSTIC DATA (LABS, IMAGING, TESTING) - I reviewed patient records, labs, notes, testing and imaging myself where available.  Lab Results  Component Value Date   WBC 4.0 07/27/2018   HGB 10.3 (L) 07/27/2018   HCT 31.8 (L) 07/27/2018     MCV 94 07/27/2018   PLT 289 07/27/2018      Component Value Date/Time   NA 147 (H) 09/11/2018 0922   K 4.9 09/11/2018 0922   CL 106 09/11/2018 0922   CO2 27 09/11/2018 0922   GLUCOSE 82 09/11/2018 0922   GLUCOSE 77 07/24/2018 0330   BUN 21 09/11/2018 0922   CREATININE 0.55 (L) 09/11/2018 0922   CREATININE 0.82 06/18/2013 1009   CALCIUM 9.3 09/11/2018 0922   PROT 4.0 (L) 07/24/2018 0330   PROT 5.8 (L) 05/10/2018 0829   ALBUMIN 1.9 (L) 07/24/2018 0330   ALBUMIN 4.0 05/10/2018 0829   AST 26 07/24/2018 0330   ALT 44 07/24/2018 0330   ALKPHOS 34 (L) 07/24/2018 0330   BILITOT 0.3 07/24/2018 0330   BILITOT 0.3 05/10/2018 0829   GFRNONAA 109 09/11/2018 0922   GFRNONAA 87 06/18/2013 1009   GFRAA 126 09/11/2018 0922   GFRAA >89 06/18/2013 1009   Lab Results  Component Value Date   CHOL 180  05/10/2018   HDL 61 05/10/2018   LDLCALC 100 (H) 05/10/2018   LDLDIRECT 122 (H) 09/29/2016   TRIG 83 06/01/2018   CHOLHDL 3.0 05/10/2018   Lab Results  Component Value Date   HGBA1C 5.3 06/15/2018   Lab Results  Component Value Date   VITAMINB12 >2000 (H) 05/10/2018   Lab Results  Component Value Date   TSH 0.47 09/13/2018      ASSESSMENT AND PLAN 52 y.o. year old female  has a past medical history of Allergic rhinitis, Barrett's esophagus, Bipolar affective disorder ([place]), CAP (community acquired pneumonia) (07/20/2018), Chronic respiratory failure ([place]), Constipation, chronic, Diabetes mellitus without complication ([place]), Dry eye, Gastroparesis, GERD (gastroesophageal reflux disease), History of bariatric surgery (03/2017), Hyperlipidemia, Hypertension, Intractable chronic migraine without aura (06/04/2015), Migraine headache, Morbid obesity ([place]), OSA (obstructive sleep apnea), Osteoarthritis, Sleep apnea, Unspecified hypothyroidism, Vitamin B 12 deficiency, and Vitamin D deficiency. here with:  1.  Intractable migraine headaches  I discussed the patient's plan of care with Dr.  Jaynee Eagles.  We will check drug level as the patient is currently on Depakote and gabapentin.  The patient has had gastric bypass there is a possibility that absorption of medication may be an issue as well?  I advised the patient that she should reduce her intake of tramadol and Tylenol as this could be causing a rebound headache..  Advised the patient to keep a detailed headache diary.  Because of her intractable headaches and fall in August I will check an MRI of the brain.  The patient will follow-up in 1 month with Dr. Jaynee Eagles as a consult for her headaches.  Patient is amenable to this plan.  Advised to call if her symptoms worsen or she develops new symptoms.  Patient was given several paper resources to take with her.   I spent 25 minutes with the patient. 50% of this time was spent reviewing plan of care   Ward Givens, MSN, NP-C 09/27/2018, 10:30 AM [facility] [address], [place], [place] [address] [phone]

## 2018-09-27 NOTE — Patient Instructions (Addendum)
Your Plan:  Continue Depakote and Gabapentin Blood work today MRI brain  Keep a detailed headache journal Come back in 1 month to meet with Dr. Jaynee Eagles  Thank you for coming to see [place] at [facility]. I hope we have been able to provide you high quality care today.  You may receive a patient satisfaction survey over the next few weeks. We would appreciate your feedback and comments so that we may continue to improve ourselves and the health of our patients.

## 2018-09-27 NOTE — Progress Notes (Signed)
Made any corrections needed, and agree with history, physical, neuro exam,assessment and plan as stated above.     Antonia Ahern, MD [facility] 

## 2018-09-27 NOTE — Telephone Encounter (Signed)
[employer] medicare order sent to GI. They will reach out to the pt to schedule.

## 2018-09-29 LAB — COMPREHENSIVE METABOLIC PANEL
ALT: 52 IU/L — AB (ref 0–32)
AST: 46 IU/L — AB (ref 0–40)
Albumin/Globulin Ratio: 1.8 (ref 1.2–2.2)
Albumin: 4.2 g/dL (ref 3.5–5.5)
Alkaline Phosphatase: 59 IU/L (ref 39–117)
BUN/Creatinine Ratio: 41 — ABNORMAL HIGH (ref 9–23)
BUN: 23 mg/dL (ref 6–24)
Bilirubin Total: <0.2 mg/dL (ref 0.0–1.2)
CALCIUM: 9.3 mg/dL (ref 8.7–10.2)
CO2: 29 mmol/L (ref 20–29)
CREATININE: 0.56 mg/dL — AB (ref 0.57–1.00)
Chloride: 99 mmol/L (ref 96–106)
GFR, EST AFRICAN AMERICAN: 125 mL/min/{1.73_m2} (ref >59)
GFR, EST NON AFRICAN AMERICAN: 108 mL/min/{1.73_m2} (ref >59)
GLUCOSE: 77 mg/dL (ref 65–99)
Globulin, Total: 2.4 g/dL (ref 1.5–4.5)
Potassium: 4.8 mmol/L (ref 3.5–5.2)
Sodium: 146 mmol/L — ABNORMAL HIGH (ref 134–144)
TOTAL PROTEIN: 6.6 g/dL (ref 6.0–8.5)

## 2018-09-29 LAB — CBC WITH DIFFERENTIAL/PLATELET
BASOS: 1 %
Basophils Absolute: 0 10*3/uL (ref 0.0–0.2)
EOS (ABSOLUTE): 0.1 10*3/uL (ref 0.0–0.4)
Eos: 3 %
HEMOGLOBIN: 12.5 g/dL (ref 11.1–15.9)
Hematocrit: 39.5 % (ref 34.0–46.6)
IMMATURE GRANS (ABS): 0 10*3/uL (ref 0.0–0.1)
IMMATURE GRANULOCYTES: 0 %
LYMPHS: 39 %
Lymphocytes Absolute: 1.5 10*3/uL (ref 0.7–3.1)
MCH: 29.2 pg (ref 26.6–33.0)
MCHC: 31.6 g/dL (ref 31.5–35.7)
MCV: 92 fL (ref 79–97)
Monocytes Absolute: 0.3 10*3/uL (ref 0.1–0.9)
Monocytes: 8 %
NEUTROS ABS: 1.8 10*3/uL (ref 1.4–7.0)
NEUTROS PCT: 49 %
PLATELETS: 266 10*3/uL (ref 150–450)
RBC: 4.28 x10E6/uL (ref 3.77–5.28)
RDW: 13.9 % (ref 12.3–15.4)
WBC: 3.8 10*3/uL (ref 3.4–10.8)

## 2018-09-29 LAB — VALPROIC ACID LEVEL: VALPROIC ACID LVL: 60 ug/mL (ref 50–100)

## 2018-09-29 LAB — GABAPENTIN LEVEL: Gabapentin Lvl: 4.6 ug/mL (ref 4.0–16.0)

## 2018-10-01 NOTE — Telephone Encounter (Signed)
Spoke with patient. Offered her appoint for Tues 11/18 at 11:30. Patient concerned of not feeling well after her back surgery scheduled for 10/31, she had an appt already for 11/26 and didn't like afternoon appointments so she took the appt on 11/07/18 @ 11:30, 1 hour appt, arrival time 11:00. She verbalized appreciation.

## 2018-10-02 ENCOUNTER — Telehealth: Payer: Self-pay | Admitting: *Deleted

## 2018-10-02 ENCOUNTER — Other Ambulatory Visit: Payer: Self-pay | Admitting: Family Medicine

## 2018-10-02 ENCOUNTER — Other Ambulatory Visit: Payer: Self-pay | Admitting: Adult Health

## 2018-10-02 NOTE — Telephone Encounter (Signed)
Spoke with patient and informed her that her blood work is consistent with previous blood work. advised her liver enzymes- AST and ALT are slightly elevated, and this could be due to the Depakote. Advised NP will continue to monitor, and since she has a follow-up with Dr. Jaynee Eagles next month, a  repeating AST and ALT may be considered at that visit.  She verbalized understanding, appreciation.

## 2018-10-02 NOTE — Pre-Procedure Instructions (Addendum)
Morgan [name]  10/02/2018      [place], [place] [address] Phone: [phone] Fax: [phone]    Your procedure is scheduled on Thursday October 31st.  Report to [facility] at 1045 A.M.  Call this number if you have problems the morning of surgery:  [phone]   Remember:  Do not eat or drink after midnight.    Take these medicines the morning of surgery with A SIP OF WATER   divalproex (DEPAKOTE)  gabapentin (NEURONTIN)  Flonase (if needed)  levothyroxine (SYNTHROID)  Claritin (if needed)  pantoprazole (PROTONIX)   Eye Drops (if needed)  sertraline (ZOLOFT)  traMADol (ULTRAM) (if needed)  7 days prior to surgery STOP taking any Voltaren Gel, Aspirin(unless otherwise instructed by your surgeon), Aleve, Naproxen, Ibuprofen, Motrin, Advil, Goody's, BC's, all herbal medications, fish oil, and all vitamins   WHAT DO I DO ABOUT MY DIABETES MEDICATION?   Marland Kitchen Do not take oral diabetes medicines (pills) the morning of surgery.  . The day of surgery, do not take other diabetes injectables, including Byetta (exenatide), Bydureon (exenatide ER), Victoza (liraglutide), or Trulicity (dulaglutide).  . If your CBG is greater than 220 mg/dL, you may take  of your sliding scale (correction) dose of insulin.   How to Manage Your Diabetes Before and After Surgery  Why is it important to control my blood sugar before and after surgery? . Improving blood sugar levels before and after surgery helps healing and can limit problems. . A way of improving blood sugar control is eating a healthy diet by: o  Eating less sugar and carbohydrates o  Increasing activity/exercise o  Talking with your doctor about reaching your blood sugar goals . High blood sugars (greater than 180 mg/dL) can raise your risk of infections and slow your recovery, so you will need to focus on controlling your  diabetes during the weeks before surgery. . Make sure that the doctor who takes care of your diabetes knows about your planned surgery including the date and location.  How do I manage my blood sugar before surgery? . Check your blood sugar at least 4 times a day, starting 2 days before surgery, to make sure that the level is not too high or low. o Check your blood sugar the morning of your surgery when you wake up and every 2 hours until you get to the Short Stay unit. . If your blood sugar is less than 70 mg/dL, you will need to treat for low blood sugar: o Do not take insulin. o Treat a low blood sugar (less than 70 mg/dL) with  cup of clear juice (cranberry or apple), 4 glucose tablets, OR glucose gel. o Recheck blood sugar in 15 minutes after treatment (to make sure it is greater than 70 mg/dL). If your blood sugar is not greater than 70 mg/dL on recheck, call [phone] for further instructions. . Report your blood sugar to the short stay nurse when you get to Short Stay.  . If you are admitted to the hospital after surgery: o Your blood sugar will be checked by the staff and you will probably be given insulin after surgery (instead of oral diabetes medicines) to make sure you have good blood sugar levels. o The goal for blood sugar control after surgery is 80-180 mg/dL.   Do not wear jewelry, make-up or nail polish.  Do not wear lotions, powders,  or perfumes, or deodorant.  Do not shave 48 hours prior to surgery.  Men may shave face and neck.  Do not bring valuables to the hospital.  [facility] is not responsible for any belongings or valuables.  Contacts, dentures or bridgework may not be worn into surgery.  Leave your suitcase in the car.  After surgery it may be brought to your room.  For patients admitted to the hospital, discharge time will be determined by your treatment team.  Patients discharged the day of surgery will not be allowed to drive home.    [place]-  Preparing For Surgery  Before surgery, you can play an important role. Because skin is not sterile, your skin needs to be as free of germs as possible. You can reduce the number of germs on your skin by washing with CHG (chlorahexidine gluconate) Soap before surgery.  CHG is an antiseptic cleaner which kills germs and bonds with the skin to continue killing germs even after washing.    Oral Hygiene is also important to reduce your risk of infection.  Remember - BRUSH YOUR TEETH THE MORNING OF SURGERY WITH YOUR REGULAR TOOTHPASTE  Please do not use if you have an allergy to CHG or antibacterial soaps. If your skin becomes reddened/irritated stop using the CHG.  Do not shave (including legs and underarms) for at least 48 hours prior to first CHG shower. It is OK to shave your face.  Please follow these instructions carefully.   1. Shower the NIGHT BEFORE SURGERY and the MORNING OF SURGERY with CHG.   2. If you chose to wash your hair, wash your hair first as usual with your normal shampoo.  3. After you shampoo, rinse your hair and body thoroughly to remove the shampoo.  4. Use CHG as you would any other liquid soap. You can apply CHG directly to the skin and wash gently with a scrungie or a clean washcloth.   5. Apply the CHG Soap to your body ONLY FROM THE NECK DOWN.  Do not use on open wounds or open sores. Avoid contact with your eyes, ears, mouth and genitals (private parts). Wash Face and genitals (private parts)  with your normal soap.  6. Wash thoroughly, paying special attention to the area where your surgery will be performed.  7. Thoroughly rinse your body with warm water from the neck down.  8. DO NOT shower/wash with your normal soap after using and rinsing off the CHG Soap.  9. Pat yourself dry with a CLEAN TOWEL.  10. Wear CLEAN PAJAMAS to bed the night before surgery, wear comfortable clothes the morning of surgery  11. Place CLEAN SHEETS on your bed the night of your  first shower and DO NOT SLEEP WITH PETS.    Day of Surgery:  Do not apply any deodorants/lotions.  Please wear clean clothes to the hospital/surgery center.   Remember to brush your teeth WITH YOUR REGULAR TOOTHPASTE.    Please read over the following fact sheets that you were given.

## 2018-10-03 ENCOUNTER — Telehealth: Payer: Self-pay | Admitting: Family Medicine

## 2018-10-03 ENCOUNTER — Encounter (HOSPITAL_COMMUNITY)
Admission: RE | Admit: 2018-10-03 | Discharge: 2018-10-03 | Disposition: A | Discharge status: Home / Self Care | Payer: Medicare Other | Source: Ambulatory Visit | Attending: Neurosurgery | Admitting: Neurosurgery

## 2018-10-03 ENCOUNTER — Encounter (HOSPITAL_COMMUNITY): Payer: Self-pay

## 2018-10-03 ENCOUNTER — Other Ambulatory Visit: Payer: Self-pay

## 2018-10-03 DIAGNOSIS — G4733 Obstructive sleep apnea (adult) (pediatric): Secondary | ICD-10-CM | POA: Diagnosis not present

## 2018-10-03 DIAGNOSIS — E119 Type 2 diabetes mellitus without complications: Secondary | ICD-10-CM | POA: Insufficient documentation

## 2018-10-03 DIAGNOSIS — I10 Essential (primary) hypertension: Secondary | ICD-10-CM | POA: Diagnosis not present

## 2018-10-03 DIAGNOSIS — Z9889 Other specified postprocedural states: Secondary | ICD-10-CM | POA: Insufficient documentation

## 2018-10-03 DIAGNOSIS — Z01812 Encounter for preprocedural laboratory examination: Secondary | ICD-10-CM | POA: Diagnosis not present

## 2018-10-03 DIAGNOSIS — K219 Gastro-esophageal reflux disease without esophagitis: Secondary | ICD-10-CM | POA: Diagnosis not present

## 2018-10-03 HISTORY — DX: Anxiety disorder, unspecified: F41.9

## 2018-10-03 HISTORY — DX: Personal history of other diseases of the digestive system: Z87.19

## 2018-10-03 HISTORY — DX: Depression, unspecified: F32.A

## 2018-10-03 HISTORY — DX: Spondylosis, unspecified: M47.9

## 2018-10-03 HISTORY — DX: Family history of other specified conditions: Z84.89

## 2018-10-03 HISTORY — DX: Major depressive disorder, single episode, unspecified: F32.9

## 2018-10-03 LAB — CBC
HCT: 39.5 % (ref 36.0–46.0)
HEMOGLOBIN: 12.2 g/dL (ref 12.0–15.0)
MCH: 29.3 pg (ref 26.0–34.0)
MCHC: 30.9 g/dL (ref 30.0–36.0)
MCV: 94.7 fL (ref 80.0–100.0)
Platelets: 231 10*3/uL (ref 150–400)
RBC: 4.17 MIL/uL (ref 3.87–5.11)
RDW: 14 % (ref 11.5–15.5)
WBC: 4.2 10*3/uL (ref 4.0–10.5)
nRBC: 0 % (ref 0.0–0.2)

## 2018-10-03 LAB — BASIC METABOLIC PANEL
Anion gap: 8 (ref 5–15)
BUN: 20 mg/dL (ref 6–20)
CHLORIDE: 103 mmol/L (ref 98–111)
CO2: 30 mmol/L (ref 22–32)
CREATININE: 0.54 mg/dL (ref 0.44–1.00)
Calcium: 8.7 mg/dL — ABNORMAL LOW (ref 8.9–10.3)
GFR calc Af Amer: >60 mL/min (ref >60)
GFR calc non Af Amer: >60 mL/min (ref >60)
Glucose, Bld: 86 mg/dL (ref 70–99)
Potassium: 4.1 mmol/L (ref 3.5–5.1)
SODIUM: 141 mmol/L (ref 135–145)

## 2018-10-03 LAB — SURGICAL PCR SCREEN
MRSA, PCR: NEGATIVE
STAPHYLOCOCCUS AUREUS: NEGATIVE

## 2018-10-03 MED ORDER — LEVOTHYROXINE SODIUM 112 MCG PO TABS: 112.0000 ug | ORAL_TABLET | Freq: Every day | ORAL | 2 refills | Status: DC

## 2018-10-03 NOTE — Progress Notes (Signed)
PCP - Dr. Lajuana Ripple Cardiologist -  Dr. Skeet Latch  Chest x-ray - 09/25/18 EKG - 07/22/18 Stress Test - 2017 ECHO - 2019  Sleep Study - 2019 CPAP - none   Blood Thinner Instructions: N/A Aspirin Instructions: N/A  Anesthesia review: yes. EKG review.   Patient denies shortness of breath, fever, cough and chest pain at PAT appointment   Patient verbalized understanding of instructions that were given to them at the PAT appointment. Patient was also instructed that they will need to review over the PAT instructions again at home before surgery.

## 2018-10-03 NOTE — Telephone Encounter (Signed)
Refilled levothyroxine.

## 2018-10-04 ENCOUNTER — Other Ambulatory Visit: Payer: Self-pay | Admitting: Neurosurgery

## 2018-10-04 NOTE — Progress Notes (Signed)
Anesthesia Chart Review:  Case:  [id number] Date/Time:  10/11/18 1230   Procedure:  LAMINECTOMY AND FORAMINOTOMY RIGHT LUMBAR 3- LUMBAR 4 (Right ) - LAMINECTOMY AND FORAMINOTOMY RIGHT LUMBAR 3- LUMBAR 4   Anesthesia type:  General   Pre-op diagnosis:  LUMBAR RADICULOPATHY, CHRONIC   Location:  [facility] OR ROOM 13 / [facility]   Surgeon:  Consuella Lose, MD      DISCUSSION: 52 yo female never smoker. Pertinent hx includes Barrett's esophagus, bipolar disorder, DMII, gastroparesis, GERD, hyperlipidemia, hypertension, migraine, sleep apnea, bradycardia, history of bariatric surgery. She had gastric bypass surgery in the past. She has lost a significant amount of weight. In July and feeding tube had to be placed because of malnutrition however per recent notes she is not using the tube.  Pt admitted 8/9-8/13/2019 for sepsis secondary to PNA. She was initially admitted to ICU under critical care service with shock secondary to right-sided pneumonia and started on antibiotics.  Her condition gradually improved and she was transferred to hospitalist service. Cardiology was also consulted for bradycardia and cardiology recommended to avoid nodal agents.  She had no recurrent symptoms once her blood pressure improved.  She had an echocardiogram 07/2018 that revealed LVEF 55 to 60% and grade 1 diastolic dysfunction.  Since being discharged she has been feeling well.   Followed up with Dr. Oval Linsey after discharge on 08/20/2018. Per her note "She denies any syncope or presyncope.  Her heart rate has been in the 60s to 70s.  She is trying to get exercise by walking.  In the past she liked to do water aerobics but cannot do that because she has a feeding tube.  She has no lower extremity edema, orthopnea, or PND.  She feels well with exercise."  Anticipate she can proceed as planned barring acute status change.  VS: BP 113/69   Pulse 91   Temp 36.8 C   Resp 20   Ht 5\' 4"  (1.626 m)   Wt 57.3 kg   LMP 03/17/2017   SpO2  99%   BMI 21.68 kg/m   PROVIDERS: Janora Norlander, DO is PCP  Skeet Latch, MD is Cardiologist  Ezzie Dural, MD is Gastroenterologist  LABS: Labs reviewed: Acceptable for surgery. (all labs ordered are listed, but only abnormal results are displayed)  Labs Reviewed  BASIC METABOLIC PANEL - Abnormal; Notable for the following components:      Result Value   Calcium 8.7 (*)    All other components within normal limits  SURGICAL PCR SCREEN  CBC     IMAGES: CHEST - 2 VIEW 09/25/18  COMPARISON:  CT 09/11/2018, chest x-ray 09/11/2018  FINDINGS: Cardiomediastinal silhouette unchanged in size and contour. No pneumothorax. No pleural effusion. No confluent airspace disease. Compared to the prior plain film there is persisting reticulonodular opacity in the mid right lung, corresponding to findings on prior plain film and CT.  No displaced fracture.  IMPRESSION: Similar appearance of the chest x-ray 2 09/11/2018, with persisting reticular opacity nodule in the right mid lung, compatible with residual inflammation/infection.   EKG: 07/22/2018: Sinus bradycardia. Low voltage QRS  CV: TTE 07/23/2018: Study Conclusions  - Left ventricle: The cavity size was normal. Wall thickness was   normal. Systolic function was normal. The estimated ejection   fraction was in the range of 55% to 60%. Doppler parameters are   consistent with abnormal left ventricular relaxation (grade 1   diastolic dysfunction). - Left atrium: The atrium was mildly dilated.  Lexiscan  stress 09/21/2016:  The left ventricular ejection fraction is normal (55-65%).  Nuclear stress EF: 61%. No wall motion abnormalities  There was no ST segment deviation noted during stress.  Defect 1: There is a small defect of mild severity present in the apical anterior and apical septal location. Possibly breast attenuation artifact.  This is a low risk study. No ischemia identified.  Past  Medical History:  Diagnosis Date  . Allergic rhinitis   . Anxiety   . Barrett's esophagus   . Bipolar affective disorder ([place])   . CAP (community acquired pneumonia) 07/20/2018  . Chronic respiratory failure ([place])   . Constipation, chronic   . Degenerative joint disease of spine   . Depression   . Diabetes mellitus without complication ([place])    type 2   . Dry eye   . Family history of adverse reaction to anesthesia    nausea and vomiting  . Gastroparesis   . GERD (gastroesophageal reflux disease)   . History of bariatric surgery 03/2017  . History of hiatal hernia   . Hyperlipidemia   . Hypertension   . Intractable chronic migraine without aura 06/04/2015  . Migraine headache   . Morbid obesity ([place])   . OSA (obstructive sleep apnea)    uses a cpap with 2 units o2   . Osteoarthritis    bilateral knee  . Sleep apnea    has c-pap  . Unspecified hypothyroidism   . Vitamin B 12 deficiency   . Vitamin D deficiency     Past Surgical History:  Procedure Laterality Date  . APPENDECTOMY  1978  . CHOLECYSTECTOMY  2005  . COLONOSCOPY    . GASTRIC ROUX-EN-Y N/A 03/21/2017   Procedure: LAPAROSCOPIC ROUX-EN-Y GASTRIC, UPPER ENDO;  Surgeon: Greer Pickerel, MD;  Location: [facility];  Service: General;  Laterality: N/A;  . GASTROSTOMY N/A 06/19/2018   Procedure: LAPRASCOPIC INSERTION OF GASTROSTOMY TUBE;  Surgeon: Greer Pickerel, MD;  Location: [facility];  Service: General;  Laterality: N/A;  . GASTROSTOMY TUBE PLACEMENT Left    06/2018  . HERNIA REPAIR    . HIATAL HERNIA REPAIR N/A 06/19/2018   Procedure: LAPAROSCOPIC REPAIR OF HIATAL HERNIA;  Surgeon: Greer Pickerel, MD;  Location: [facility];  Service: General;  Laterality: N/A;  . LAPAROSCOPY N/A 06/19/2018   Procedure: LAPAROSCOPY DIAGNOSTIC;  Surgeon: Greer Pickerel, MD;  Location: [facility];  Service: General;  Laterality: N/A;  . SHOULDER ARTHROSCOPY  7/12   left-dsc  . TONSILLECTOMY  at age 4  . TRIGGER FINGER RELEASE  12/20/2012   Procedure: RELEASE  TRIGGER FINGER/A-1 PULLEY;  Surgeon: Tennis Must, MD;  Location: [place];  Service: Orthopedics;  Laterality: Left;  LEFT TRIGGER THUMB RELEASE    MEDICATIONS: . AIMOVIG 140 MG/ML SOAJ  . Calcium-Vitamin D-Vitamin K 650-12.5-40 MG-MCG-MCG CHEW  . Cyanocobalamin (VITAMIN B-12 IJ)  . diclofenac sodium (VOLTAREN) 1 % GEL  . divalproex (DEPAKOTE) 500 MG DR tablet  . ezetimibe (ZETIA) 10 MG tablet  . fluticasone (FLONASE) 50 MCG/ACT nasal spray  . gabapentin (NEURONTIN) 300 MG capsule  . glucose blood (ONE TOUCH ULTRA TEST) test strip  . levothyroxine (SYNTHROID, LEVOTHROID) 112 MCG tablet  . lipase/protease/amylase (CREON) 36000 UNITS CPEP capsule  . loratadine (CLARITIN) 10 MG tablet  . Multiple Vitamin (MULTIVITAMIN) capsule  . nystatin [facility]) powder  . pantoprazole (PROTONIX) 40 MG tablet  . PAZEO 0.7 % SOLN  . polyethylene glycol (MIRALAX / GLYCOLAX) packet  . RESTASIS MULTIDOSE 0.05 %  ophthalmic emulsion  . sertraline (ZOLOFT) 100 MG tablet  . tiZANidine (ZANAFLEX) 2 MG tablet  . traMADol (ULTRAM) 50 MG tablet  . ziprasidone (GEODON) 60 MG capsule   . cyanocobalamin ((VITAMIN B-12)) injection 1,000 mcg     Wynonia Musty [facility] Short Stay Center/Anesthesiology Phone [phone] 10/04/2018 9:55 AM

## 2018-10-04 NOTE — Anesthesia Preprocedure Evaluation (Addendum)
Anesthesia Evaluation  Patient identified by MRN, date of birth, ID band Patient awake    Reviewed: Allergy & Precautions, NPO status , Patient's Chart, lab work & pertinent test results  Airway Mallampati: II  TM Distance: >3 FB Neck ROM: Full    Dental  (+) Chipped, Dental Advisory Given,    Pulmonary sleep apnea , pneumonia, resolved,    Pulmonary exam normal breath sounds clear to auscultation       Cardiovascular hypertension, negative cardio ROS Normal cardiovascular exam Rhythm:Regular Rate:Normal  EKG: 07/22/2018: Sinus bradycardia. Low voltage QRS  CV: TTE 07/23/2018: - Left ventricle: The cavity size was normal. Wall thickness wasnormal. Systolic function was normal. The estimated ejection fraction was in the range of 55% to 60%. Doppler parameters areconsistent with abnormal left ventricular relaxation (grade 1diastolic dysfunction). - Left atrium: The atrium was mildly dilated.  Lexiscan stress 09/21/2016: The left ventricular ejection fraction is normal (55-65%). Nuclear stress EF: 61%. No wall motion abnormalities There was no ST segment deviation noted during stress. Defect 1: There is a small defect of mild severity present in the apical anterior and apical septal location. Possibly breast attenuation artifact. This is a low risk study. No ischemia identified.   Neuro/Psych  Headaches, PSYCHIATRIC DISORDERS Anxiety Depression Bipolar Disorder    GI/Hepatic Neg liver ROS, hiatal hernia, GERD  ,  Endo/Other  diabetes, Type 2Hypothyroidism   Renal/GU negative Renal ROS  negative genitourinary   Musculoskeletal  (+) Arthritis , Osteoarthritis,    Abdominal   Peds  Hematology negative hematology ROS (+)   Anesthesia Other Findings Lumbar radiculopathy  Reproductive/Obstetrics                           Anesthesia Physical Anesthesia Plan  ASA: III  Anesthesia Plan:  General   Post-op Pain Management:    Induction: Intravenous  PONV Risk Score and Plan: 3 and Midazolam, Dexamethasone and Ondansetron  Airway Management Planned: Oral ETT  Additional Equipment:   Intra-op Plan:   Post-operative Plan: Extubation in OR  Informed Consent: I have reviewed the patients History and Physical, chart, labs and discussed the procedure including the risks, benefits and alternatives for the proposed anesthesia with the patient or authorized representative who has indicated his/her understanding and acceptance.   Dental advisory given  Plan Discussed with: CRNA  Anesthesia Plan Comments: (  )       Anesthesia Quick Evaluation

## 2018-10-08 ENCOUNTER — Ambulatory Visit
Admission: RE | Admit: 2018-10-08 | Discharge: 2018-10-08 | Disposition: A | Discharge status: Home / Self Care | Payer: Medicare Other | Source: Ambulatory Visit | Attending: Adult Health | Admitting: Adult Health

## 2018-10-08 DIAGNOSIS — G44329 Chronic post-traumatic headache, not intractable: Secondary | ICD-10-CM

## 2018-10-08 MED ORDER — GADOBENATE DIMEGLUMINE 529 MG/ML IV SOLN
11.0000 mL | Freq: Once | INTRAVENOUS | Status: AC | PRN
  Administered 2018-10-08: 11 mL via INTRAVENOUS

## 2018-10-09 ENCOUNTER — Telehealth: Payer: Self-pay | Admitting: *Deleted

## 2018-10-09 NOTE — Telephone Encounter (Signed)
-----   Message from Ward Givens, NP sent at 10/09/2018  9:39 AM EDT ----- MRI brain is normal. Please call patient

## 2018-10-09 NOTE — Telephone Encounter (Signed)
Spoke to pt and let her know that MRI brain was normal study. She verbalized understanding.

## 2018-10-11 ENCOUNTER — Encounter (HOSPITAL_COMMUNITY)
Admission: RE | Disposition: A | Discharge status: Home / Self Care | Payer: Self-pay | Source: Home / Self Care | Attending: Neurosurgery

## 2018-10-11 ENCOUNTER — Ambulatory Visit (HOSPITAL_COMMUNITY): Payer: Medicare Other

## 2018-10-11 ENCOUNTER — Inpatient Hospital Stay (HOSPITAL_COMMUNITY)
Admission: RE | Admit: 2018-10-11 | Discharge: 2018-10-11 | DRG: 517 | Disposition: A | Discharge status: Home / Self Care | Payer: Medicare Other | Attending: Neurosurgery | Admitting: Neurosurgery

## 2018-10-11 ENCOUNTER — Ambulatory Visit (HOSPITAL_COMMUNITY): Payer: Medicare Other | Admitting: Certified Registered Nurse Anesthetist

## 2018-10-11 ENCOUNTER — Other Ambulatory Visit: Payer: Self-pay

## 2018-10-11 ENCOUNTER — Ambulatory Visit (HOSPITAL_COMMUNITY): Payer: Medicare Other | Admitting: Physician Assistant

## 2018-10-11 DIAGNOSIS — Z885 Allergy status to narcotic agent status: Secondary | ICD-10-CM

## 2018-10-11 DIAGNOSIS — Z8371 Family history of colonic polyps: Secondary | ICD-10-CM

## 2018-10-11 DIAGNOSIS — M79604 Pain in right leg: Secondary | ICD-10-CM | POA: Diagnosis present

## 2018-10-11 DIAGNOSIS — K760 Fatty (change of) liver, not elsewhere classified: Secondary | ICD-10-CM | POA: Diagnosis not present

## 2018-10-11 DIAGNOSIS — K219 Gastro-esophageal reflux disease without esophagitis: Secondary | ICD-10-CM | POA: Diagnosis present

## 2018-10-11 DIAGNOSIS — M4726 Other spondylosis with radiculopathy, lumbar region: Secondary | ICD-10-CM | POA: Diagnosis not present

## 2018-10-11 DIAGNOSIS — Z79899 Other long term (current) drug therapy: Secondary | ICD-10-CM

## 2018-10-11 DIAGNOSIS — Z6821 Body mass index (BMI) 21.0-21.9, adult: Secondary | ICD-10-CM

## 2018-10-11 DIAGNOSIS — F319 Bipolar disorder, unspecified: Secondary | ICD-10-CM | POA: Diagnosis present

## 2018-10-11 DIAGNOSIS — K449 Diaphragmatic hernia without obstruction or gangrene: Secondary | ICD-10-CM | POA: Diagnosis present

## 2018-10-11 DIAGNOSIS — E785 Hyperlipidemia, unspecified: Secondary | ICD-10-CM | POA: Diagnosis present

## 2018-10-11 DIAGNOSIS — Z9884 Bariatric surgery status: Secondary | ICD-10-CM

## 2018-10-11 DIAGNOSIS — F419 Anxiety disorder, unspecified: Secondary | ICD-10-CM | POA: Diagnosis present

## 2018-10-11 DIAGNOSIS — E538 Deficiency of other specified B group vitamins: Secondary | ICD-10-CM | POA: Diagnosis present

## 2018-10-11 DIAGNOSIS — G8929 Other chronic pain: Secondary | ICD-10-CM | POA: Diagnosis present

## 2018-10-11 DIAGNOSIS — I10 Essential (primary) hypertension: Secondary | ICD-10-CM | POA: Diagnosis present

## 2018-10-11 DIAGNOSIS — E1169 Type 2 diabetes mellitus with other specified complication: Secondary | ICD-10-CM | POA: Diagnosis present

## 2018-10-11 DIAGNOSIS — E8881 Metabolic syndrome: Secondary | ICD-10-CM | POA: Diagnosis present

## 2018-10-11 DIAGNOSIS — Z8249 Family history of ischemic heart disease and other diseases of the circulatory system: Secondary | ICD-10-CM

## 2018-10-11 DIAGNOSIS — M48061 Spinal stenosis, lumbar region without neurogenic claudication: Secondary | ICD-10-CM | POA: Diagnosis present

## 2018-10-11 DIAGNOSIS — Z8349 Family history of other endocrine, nutritional and metabolic diseases: Secondary | ICD-10-CM

## 2018-10-11 DIAGNOSIS — K2270 Barrett's esophagus without dysplasia: Secondary | ICD-10-CM | POA: Diagnosis not present

## 2018-10-11 DIAGNOSIS — Z791 Long term (current) use of non-steroidal anti-inflammatories (NSAID): Secondary | ICD-10-CM

## 2018-10-11 DIAGNOSIS — Z86010 Personal history of colonic polyps: Secondary | ICD-10-CM

## 2018-10-11 DIAGNOSIS — Z823 Family history of stroke: Secondary | ICD-10-CM

## 2018-10-11 DIAGNOSIS — E559 Vitamin D deficiency, unspecified: Secondary | ICD-10-CM | POA: Diagnosis present

## 2018-10-11 DIAGNOSIS — M5416 Radiculopathy, lumbar region: Secondary | ICD-10-CM | POA: Diagnosis present

## 2018-10-11 DIAGNOSIS — Z91048 Other nonmedicinal substance allergy status: Secondary | ICD-10-CM

## 2018-10-11 DIAGNOSIS — Z981 Arthrodesis status: Secondary | ICD-10-CM | POA: Diagnosis not present

## 2018-10-11 DIAGNOSIS — G4733 Obstructive sleep apnea (adult) (pediatric): Secondary | ICD-10-CM | POA: Diagnosis present

## 2018-10-11 DIAGNOSIS — Z9049 Acquired absence of other specified parts of digestive tract: Secondary | ICD-10-CM | POA: Diagnosis not present

## 2018-10-11 DIAGNOSIS — Z800 Family history of malignant neoplasm of digestive organs: Secondary | ICD-10-CM

## 2018-10-11 DIAGNOSIS — E1143 Type 2 diabetes mellitus with diabetic autonomic (poly)neuropathy: Secondary | ICD-10-CM | POA: Diagnosis present

## 2018-10-11 DIAGNOSIS — M549 Dorsalgia, unspecified: Secondary | ICD-10-CM | POA: Diagnosis present

## 2018-10-11 DIAGNOSIS — E039 Hypothyroidism, unspecified: Secondary | ICD-10-CM | POA: Diagnosis not present

## 2018-10-11 DIAGNOSIS — Z419 Encounter for procedure for purposes other than remedying health state, unspecified: Secondary | ICD-10-CM

## 2018-10-11 DIAGNOSIS — Z79890 Hormone replacement therapy (postmenopausal): Secondary | ICD-10-CM

## 2018-10-11 DIAGNOSIS — R001 Bradycardia, unspecified: Secondary | ICD-10-CM | POA: Diagnosis not present

## 2018-10-11 DIAGNOSIS — Z888 Allergy status to other drugs, medicaments and biological substances status: Secondary | ICD-10-CM

## 2018-10-11 DIAGNOSIS — Z8261 Family history of arthritis: Secondary | ICD-10-CM

## 2018-10-11 DIAGNOSIS — E119 Type 2 diabetes mellitus without complications: Secondary | ICD-10-CM | POA: Diagnosis not present

## 2018-10-11 DIAGNOSIS — Z79891 Long term (current) use of opiate analgesic: Secondary | ICD-10-CM

## 2018-10-11 DIAGNOSIS — Z833 Family history of diabetes mellitus: Secondary | ICD-10-CM

## 2018-10-11 DIAGNOSIS — Z825 Family history of asthma and other chronic lower respiratory diseases: Secondary | ICD-10-CM

## 2018-10-11 HISTORY — PX: LUMBAR LAMINECTOMY/DECOMPRESSION MICRODISCECTOMY: SHX5026

## 2018-10-11 LAB — GLUCOSE, CAPILLARY
GLUCOSE-CAPILLARY: 72 mg/dL (ref 70–99)
Glucose-Capillary: 74 mg/dL (ref 70–99)
Glucose-Capillary: 94 mg/dL (ref 70–99)

## 2018-10-11 IMAGING — CR DG LUMBAR SPINE 2-3V
2 series · 2 of 2 positions shown · non-contrast
Comparison: [DATE]

CLINICAL DATA: Localization for laminectomy

EXAM:
LUMBAR SPINE - 2-3 VIEW

[lateral (1 of 2)]
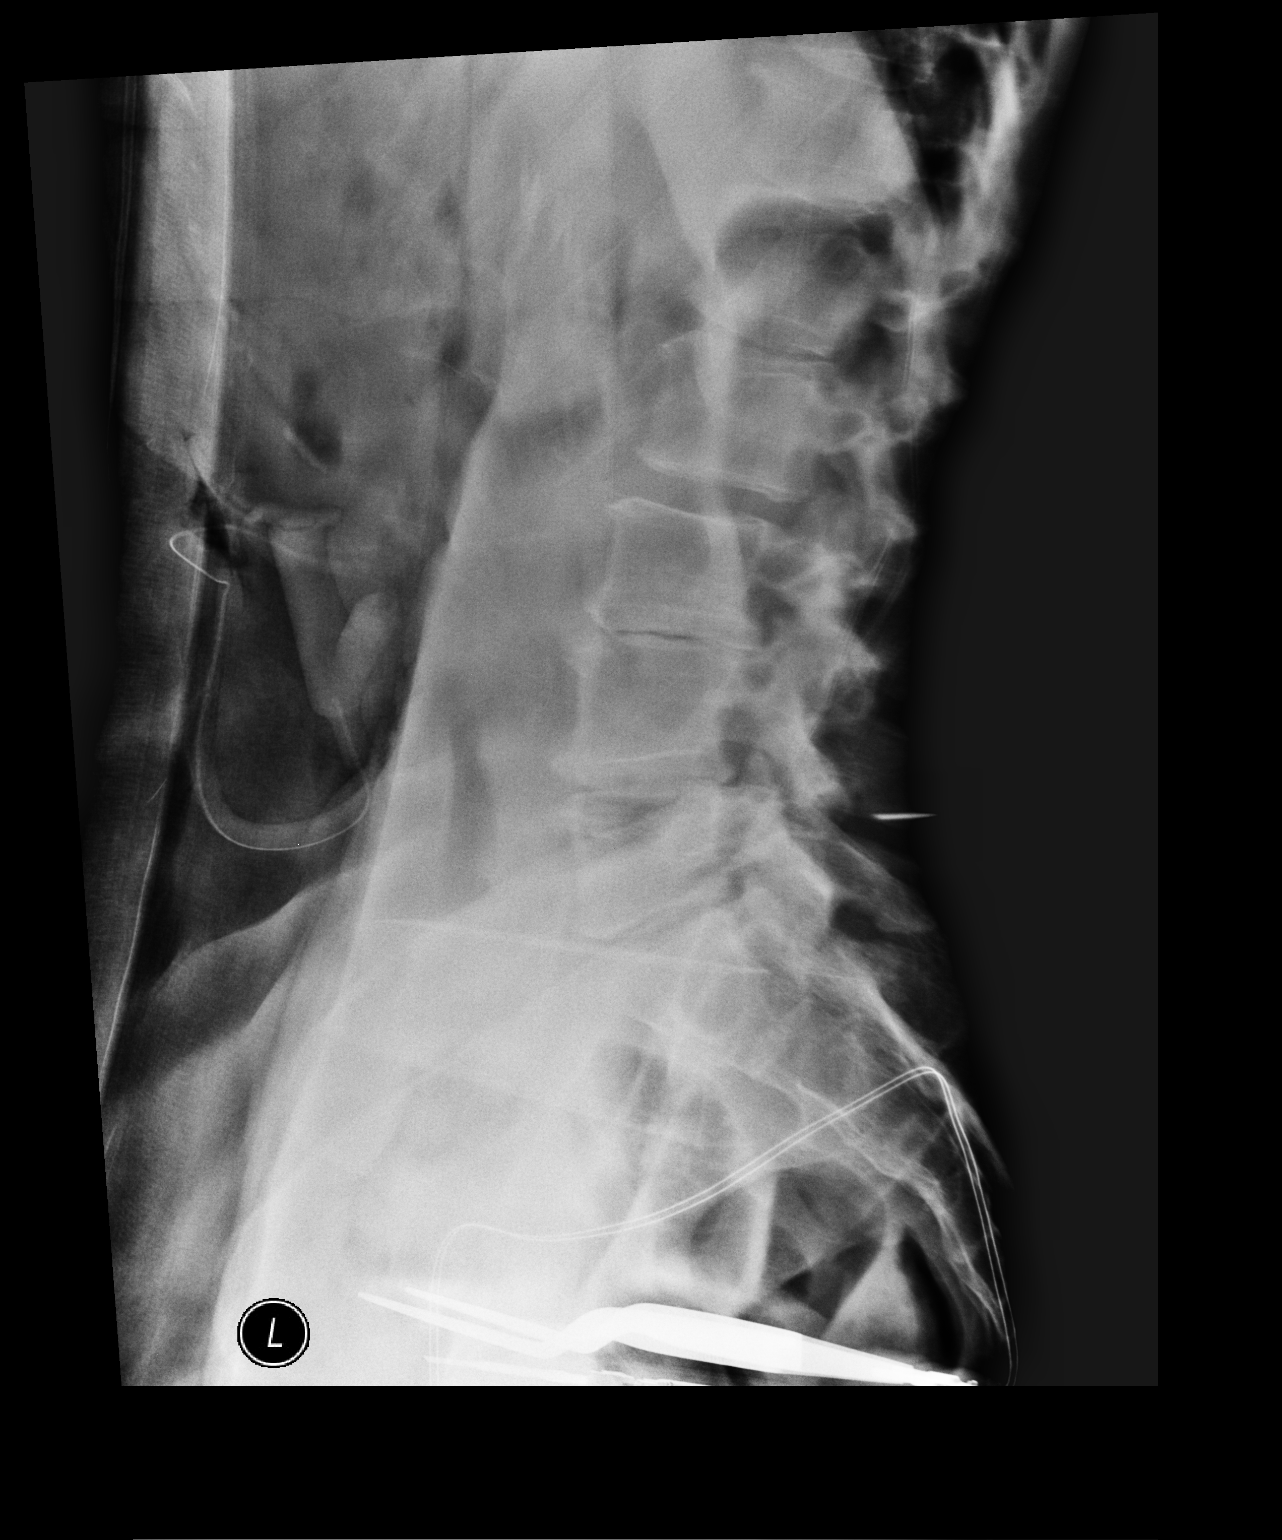

[lateral (2 of 2)]
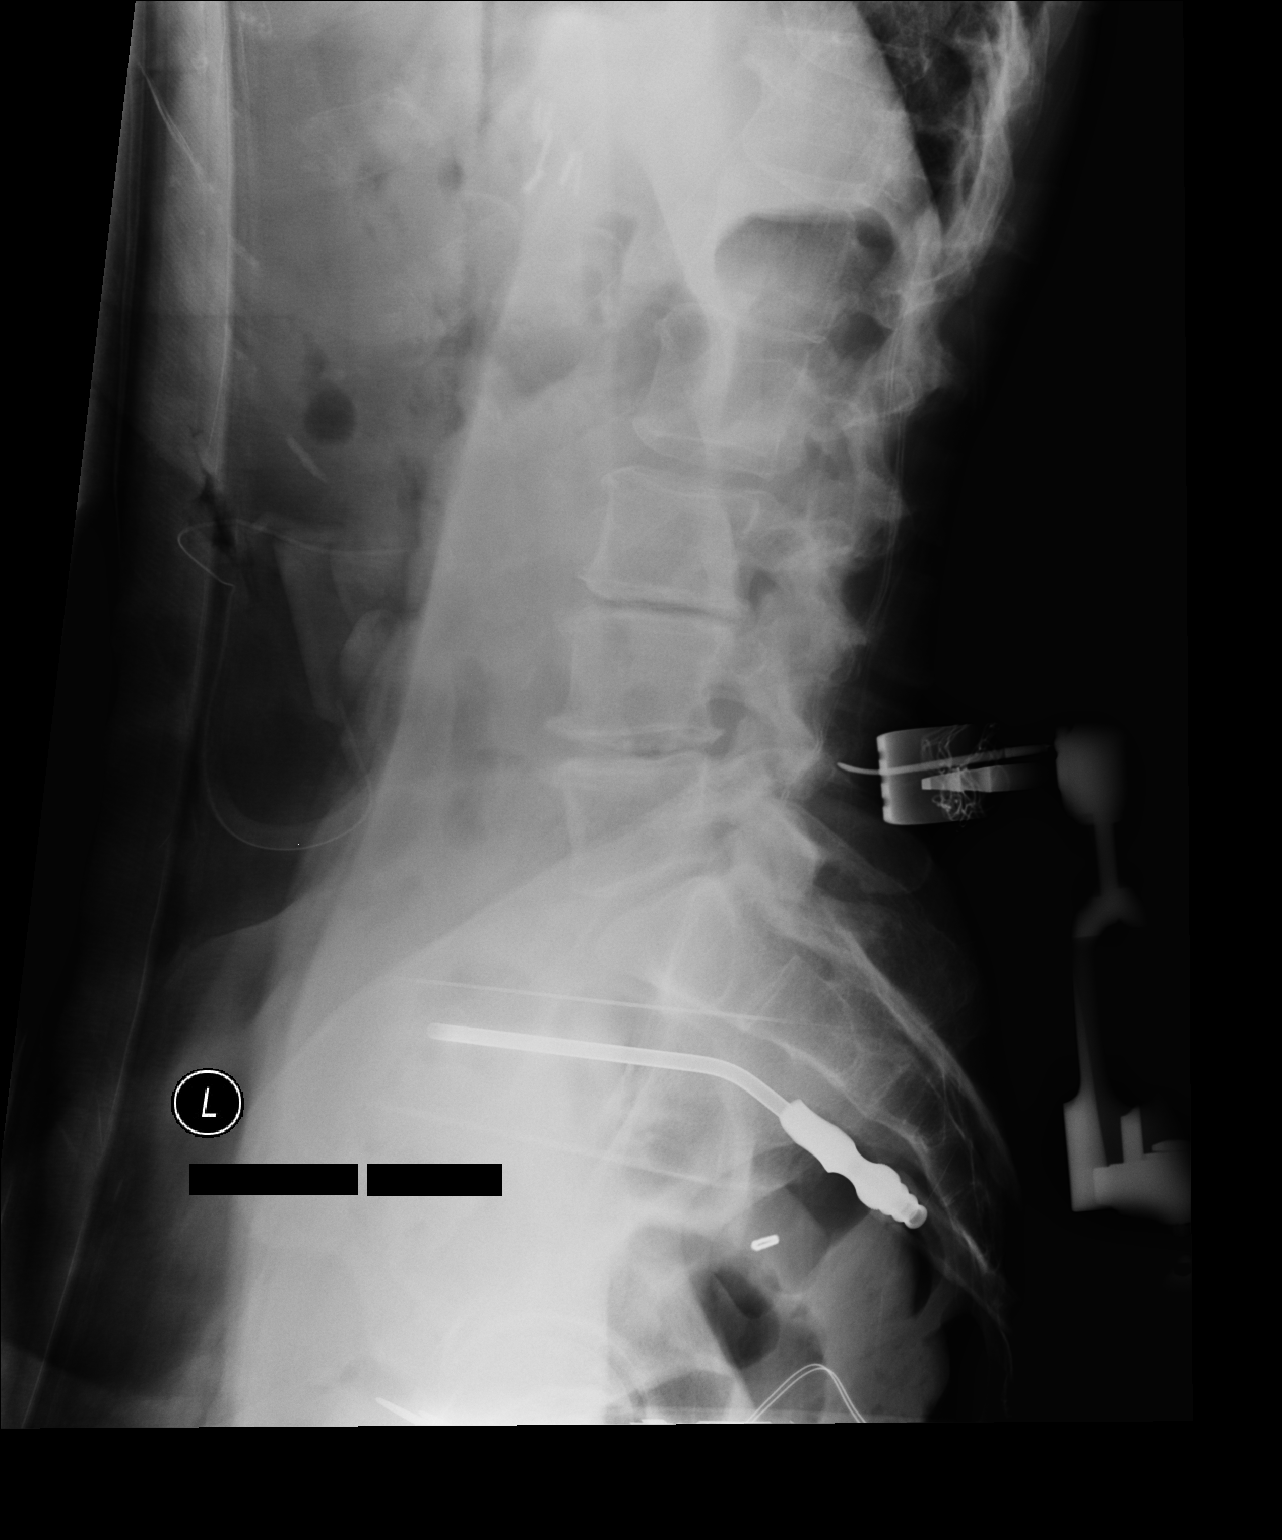

[2 of 2 positions shown; findings below may reference images not displayed]

FINDINGS: Needle is noted in the posterior soft tissues of the lumbar region
just above the L5 spinous process. Subsequent image demonstrates
retractors and surgical instruments at the L4-5 level. The numbering
nomenclature is similar to that utilized on prior MRI.
IMPRESSION: Intraoperative lumbar localization as described.

## 2018-10-11 SURGERY — LUMBAR LAMINECTOMY/DECOMPRESSION MICRODISCECTOMY 1 LEVEL
Anesthesia: General | Site: Spine Lumbar | Laterality: Right

## 2018-10-11 MED ORDER — SODIUM CHLORIDE 0.9 % IV SOLN: INTRAVENOUS | Status: DC

## 2018-10-11 MED ORDER — DEXAMETHASONE SODIUM PHOSPHATE 10 MG/ML IJ SOLN
INTRAMUSCULAR | Status: AC
  Filled 2018-10-11: qty 1

## 2018-10-11 MED ORDER — SUGAMMADEX SODIUM 200 MG/2ML IV SOLN
INTRAVENOUS | Status: DC | PRN
  Administered 2018-10-11: 150 mg via INTRAVENOUS

## 2018-10-11 MED ORDER — LIDOCAINE-EPINEPHRINE 1 %-1:100000 IJ SOLN
INTRAMUSCULAR | Status: AC
  Filled 2018-10-11: qty 1

## 2018-10-11 MED ORDER — HYDROMORPHONE HCL 1 MG/ML IJ SOLN: 0.5000 mg | INTRAMUSCULAR | Status: DC | PRN

## 2018-10-11 MED ORDER — ACETAMINOPHEN 500 MG PO TABS: 1000.0000 mg | ORAL_TABLET | Freq: Four times a day (QID) | ORAL | Status: DC

## 2018-10-11 MED ORDER — METHOCARBAMOL 500 MG PO TABS
ORAL_TABLET | ORAL | Status: AC
  Filled 2018-10-11: qty 1

## 2018-10-11 MED ORDER — LIDOCAINE 2% (20 MG/ML) 5 ML SYRINGE
INTRAMUSCULAR | Status: DC | PRN
  Administered 2018-10-11: 60 mg via INTRAVENOUS

## 2018-10-11 MED ORDER — THROMBIN 5000 UNITS EX SOLR
CUTANEOUS | Status: AC
  Filled 2018-10-11: qty 15000

## 2018-10-11 MED ORDER — ROCURONIUM BROMIDE 50 MG/5ML IV SOSY
PREFILLED_SYRINGE | INTRAVENOUS | Status: AC
  Filled 2018-10-11: qty 5

## 2018-10-11 MED ORDER — FLEET ENEMA 7-19 GM/118ML RE ENEM: 1.0000 | ENEMA | Freq: Once | RECTAL | Status: DC | PRN

## 2018-10-11 MED ORDER — OXYCODONE HCL 5 MG PO TABS: 5.0000 mg | ORAL_TABLET | ORAL | Status: DC | PRN

## 2018-10-11 MED ORDER — METHOCARBAMOL 1000 MG/10ML IJ SOLN: 500.0000 mg | Freq: Four times a day (QID) | INTRAVENOUS | Status: DC | PRN

## 2018-10-11 MED ORDER — GABAPENTIN 300 MG PO CAPS: 300.0000 mg | ORAL_CAPSULE | Freq: Three times a day (TID) | ORAL | Status: DC

## 2018-10-11 MED ORDER — MENTHOL 3 MG MT LOZG: 1.0000 | LOZENGE | OROMUCOSAL | Status: DC | PRN

## 2018-10-11 MED ORDER — CEFAZOLIN SODIUM-DEXTROSE 2-4 GM/100ML-% IV SOLN
2.0000 g | INTRAVENOUS | Status: AC
  Administered 2018-10-11: 2 g via INTRAVENOUS

## 2018-10-11 MED ORDER — SENNA 8.6 MG PO TABS: 1.0000 | ORAL_TABLET | Freq: Two times a day (BID) | ORAL | Status: DC

## 2018-10-11 MED ORDER — SODIUM CHLORIDE 0.9 % IV SOLN
INTRAVENOUS | Status: DC | PRN
  Administered 2018-10-11: 500 mL

## 2018-10-11 MED ORDER — SENNOSIDES-DOCUSATE SODIUM 8.6-50 MG PO TABS: 1.0000 | ORAL_TABLET | Freq: Every evening | ORAL | Status: DC | PRN

## 2018-10-11 MED ORDER — BISACODYL 10 MG RE SUPP: 10.0000 mg | Freq: Every day | RECTAL | Status: DC | PRN

## 2018-10-11 MED ORDER — BUPIVACAINE HCL (PF) 0.5 % IJ SOLN
INTRAMUSCULAR | Status: DC | PRN
  Administered 2018-10-11: 4 mL

## 2018-10-11 MED ORDER — ROCURONIUM BROMIDE 50 MG/5ML IV SOSY
PREFILLED_SYRINGE | INTRAVENOUS | Status: AC
  Filled 2018-10-11: qty 10

## 2018-10-11 MED ORDER — SODIUM CHLORIDE 0.9% FLUSH: 3.0000 mL | INTRAVENOUS | Status: DC | PRN

## 2018-10-11 MED ORDER — ACETAMINOPHEN 650 MG RE SUPP: 650.0000 mg | RECTAL | Status: DC | PRN

## 2018-10-11 MED ORDER — 0.9 % SODIUM CHLORIDE (POUR BTL) OPTIME
TOPICAL | Status: DC | PRN
  Administered 2018-10-11: 1000 mL

## 2018-10-11 MED ORDER — BUPIVACAINE HCL (PF) 0.5 % IJ SOLN
INTRAMUSCULAR | Status: AC
  Filled 2018-10-11: qty 30

## 2018-10-11 MED ORDER — PROPOFOL 10 MG/ML IV BOLUS
INTRAVENOUS | Status: AC
  Filled 2018-10-11: qty 20

## 2018-10-11 MED ORDER — SODIUM CHLORIDE 0.9% FLUSH: 3.0000 mL | Freq: Two times a day (BID) | INTRAVENOUS | Status: DC

## 2018-10-11 MED ORDER — PHENOL 1.4 % MT LIQD: 1.0000 | OROMUCOSAL | Status: DC | PRN

## 2018-10-11 MED ORDER — FENTANYL CITRATE (PF) 250 MCG/5ML IJ SOLN
INTRAMUSCULAR | Status: AC
  Filled 2018-10-11: qty 5

## 2018-10-11 MED ORDER — HYDROCODONE-ACETAMINOPHEN 5-325 MG PO TABS
ORAL_TABLET | ORAL | Status: AC
  Filled 2018-10-11: qty 1

## 2018-10-11 MED ORDER — LIDOCAINE-EPINEPHRINE 1 %-1:100000 IJ SOLN
INTRAMUSCULAR | Status: DC | PRN
  Administered 2018-10-11: 4 mL

## 2018-10-11 MED ORDER — ONDANSETRON HCL 4 MG PO TABS: 4.0000 mg | ORAL_TABLET | Freq: Four times a day (QID) | ORAL | Status: DC | PRN

## 2018-10-11 MED ORDER — ONDANSETRON HCL 4 MG/2ML IJ SOLN: 4.0000 mg | Freq: Four times a day (QID) | INTRAMUSCULAR | Status: DC | PRN

## 2018-10-11 MED ORDER — EPHEDRINE SULFATE 50 MG/ML IJ SOLN
INTRAMUSCULAR | Status: DC | PRN
  Administered 2018-10-11 (×2): 5 mg via INTRAVENOUS
  Administered 2018-10-11: 10 mg via INTRAVENOUS

## 2018-10-11 MED ORDER — ONDANSETRON HCL 4 MG/2ML IJ SOLN
INTRAMUSCULAR | Status: DC | PRN
  Administered 2018-10-11: 4 mg via INTRAVENOUS

## 2018-10-11 MED ORDER — ONDANSETRON HCL 4 MG/2ML IJ SOLN
INTRAMUSCULAR | Status: AC
  Filled 2018-10-11: qty 2

## 2018-10-11 MED ORDER — HYDROMORPHONE HCL 1 MG/ML IJ SOLN: 0.2500 mg | INTRAMUSCULAR | Status: DC | PRN

## 2018-10-11 MED ORDER — THROMBIN 5000 UNITS EX SOLR
CUTANEOUS | Status: DC | PRN
  Administered 2018-10-11 (×2): 5000 [IU] via TOPICAL

## 2018-10-11 MED ORDER — LIDOCAINE 2% (20 MG/ML) 5 ML SYRINGE
INTRAMUSCULAR | Status: AC
  Filled 2018-10-11: qty 5

## 2018-10-11 MED ORDER — OXYCODONE-ACETAMINOPHEN 7.5-325 MG PO TABS: 1.0000 | ORAL_TABLET | ORAL | 0 refills | Status: DC | PRN

## 2018-10-11 MED ORDER — METHOCARBAMOL 500 MG PO TABS
500.0000 mg | ORAL_TABLET | Freq: Four times a day (QID) | ORAL | Status: DC | PRN
  Administered 2018-10-11: 500 mg via ORAL

## 2018-10-11 MED ORDER — ROCURONIUM BROMIDE 50 MG/5ML IV SOSY
PREFILLED_SYRINGE | INTRAVENOUS | Status: DC | PRN
  Administered 2018-10-11 (×2): 10 mg via INTRAVENOUS
  Administered 2018-10-11: 40 mg via INTRAVENOUS

## 2018-10-11 MED ORDER — HEMOSTATIC AGENTS (NO CHARGE) OPTIME
TOPICAL | Status: DC | PRN
  Administered 2018-10-11: 1 via TOPICAL

## 2018-10-11 MED ORDER — THROMBIN 5000 UNITS EX SOLR
OROMUCOSAL | Status: DC | PRN
  Administered 2018-10-11: 5 mL via TOPICAL

## 2018-10-11 MED ORDER — CHLORHEXIDINE GLUCONATE CLOTH 2 % EX PADS: 6.0000 | MEDICATED_PAD | Freq: Once | CUTANEOUS | Status: DC

## 2018-10-11 MED ORDER — HYDROCODONE-ACETAMINOPHEN 5-325 MG PO TABS
1.0000 | ORAL_TABLET | ORAL | Status: DC | PRN
  Administered 2018-10-11: 1 via ORAL

## 2018-10-11 MED ORDER — DOCUSATE SODIUM 100 MG PO CAPS: 100.0000 mg | ORAL_CAPSULE | Freq: Two times a day (BID) | ORAL | Status: DC

## 2018-10-11 MED ORDER — METHYLPREDNISOLONE ACETATE 80 MG/ML IJ SUSP
INTRAMUSCULAR | Status: DC | PRN
  Administered 2018-10-11: 80 mg

## 2018-10-11 MED ORDER — PHENYLEPHRINE 40 MCG/ML (10ML) SYRINGE FOR IV PUSH (FOR BLOOD PRESSURE SUPPORT)
PREFILLED_SYRINGE | INTRAVENOUS | Status: AC
  Filled 2018-10-11: qty 10

## 2018-10-11 MED ORDER — MIDAZOLAM HCL 2 MG/2ML IJ SOLN
INTRAMUSCULAR | Status: AC
  Filled 2018-10-11: qty 2

## 2018-10-11 MED ORDER — METHYLPREDNISOLONE ACETATE 80 MG/ML IJ SUSP
INTRAMUSCULAR | Status: AC
  Filled 2018-10-11: qty 1

## 2018-10-11 MED ORDER — ACETAMINOPHEN 325 MG PO TABS: 650.0000 mg | ORAL_TABLET | ORAL | Status: DC | PRN

## 2018-10-11 MED ORDER — LACTATED RINGERS IV SOLN
INTRAVENOUS | Status: DC | PRN
  Administered 2018-10-11: 12:00:00 via INTRAVENOUS

## 2018-10-11 MED ORDER — FENTANYL CITRATE (PF) 100 MCG/2ML IJ SOLN
INTRAMUSCULAR | Status: DC | PRN
  Administered 2018-10-11: 100 ug via INTRAVENOUS

## 2018-10-11 MED ORDER — PROPOFOL 10 MG/ML IV BOLUS
INTRAVENOUS | Status: DC | PRN
  Administered 2018-10-11: 110 mg via INTRAVENOUS

## 2018-10-11 MED ORDER — CEFAZOLIN SODIUM-DEXTROSE 2-4 GM/100ML-% IV SOLN: 2.0000 g | Freq: Three times a day (TID) | INTRAVENOUS | Status: DC

## 2018-10-11 MED ORDER — LACTATED RINGERS IV SOLN
Freq: Once | INTRAVENOUS | Status: AC
  Administered 2018-10-11: 12:00:00 via INTRAVENOUS

## 2018-10-11 MED ORDER — MIDAZOLAM HCL 5 MG/5ML IJ SOLN
INTRAMUSCULAR | Status: DC | PRN
  Administered 2018-10-11: 2 mg via INTRAVENOUS

## 2018-10-11 MED ORDER — SODIUM CHLORIDE 0.9 % IV SOLN: 250.0000 mL | INTRAVENOUS | Status: DC

## 2018-10-11 MED ORDER — GLYCOPYRROLATE 0.2 MG/ML IJ SOLN
INTRAMUSCULAR | Status: DC | PRN
  Administered 2018-10-11: 0.1 mg via INTRAVENOUS

## 2018-10-11 MED ORDER — DEXAMETHASONE SODIUM PHOSPHATE 10 MG/ML IJ SOLN
INTRAMUSCULAR | Status: DC | PRN
  Administered 2018-10-11: 10 mg via INTRAVENOUS

## 2018-10-11 SURGICAL SUPPLY — 67 items
ADH SKN CLS APL DERMABOND .7 (GAUZE/BANDAGES/DRESSINGS) ×1
APL SKNCLS STERI-STRIP NONHPOA (GAUZE/BANDAGES/DRESSINGS)
BAG DECANTER FOR FLEXI CONT (MISCELLANEOUS) ×3 IMPLANT
BENZOIN TINCTURE PRP APPL 2/3 (GAUZE/BANDAGES/DRESSINGS) IMPLANT
BLADE CLIPPER SURG (BLADE) IMPLANT
BLADE SURG 11 STRL SS (BLADE) ×3 IMPLANT
BUR MATCHSTICK NEURO 3.0 LAGG (BURR) ×2 IMPLANT
BUR PRECISION FLUTE 5.0 (BURR) IMPLANT
CANISTER SUCT 3000ML PPV (MISCELLANEOUS) ×3 IMPLANT
CARTRIDGE OIL MAESTRO DRILL (MISCELLANEOUS) ×1 IMPLANT
CLOSURE WOUND 1/2 X4 (GAUZE/BANDAGES/DRESSINGS)
COVER WAND RF STERILE (DRAPES) ×3 IMPLANT
DECANTER SPIKE VIAL GLASS SM (MISCELLANEOUS) ×3 IMPLANT
DERMABOND ADVANCED (GAUZE/BANDAGES/DRESSINGS) ×2
DERMABOND ADVANCED .7 DNX12 (GAUZE/BANDAGES/DRESSINGS) ×1 IMPLANT
DIFFUSER DRILL AIR PNEUMATIC (MISCELLANEOUS) ×3 IMPLANT
DRAPE LAPAROTOMY 100X72X124 (DRAPES) ×3 IMPLANT
DRAPE MICROSCOPE LEICA (MISCELLANEOUS) ×3 IMPLANT
DRAPE SURG 17X23 STRL (DRAPES) ×3 IMPLANT
DRSG OPSITE POSTOP 3X4 (GAUZE/BANDAGES/DRESSINGS) ×1 IMPLANT
DRSG OPSITE POSTOP 4X6 (GAUZE/BANDAGES/DRESSINGS) ×2 IMPLANT
DURAPREP 26ML APPLICATOR (WOUND CARE) ×3 IMPLANT
ELECT REM PT RETURN 9FT ADLT (ELECTROSURGICAL) ×3
ELECTRODE REM PT RTRN 9FT ADLT (ELECTROSURGICAL) ×1 IMPLANT
GAUZE 4X4 16PLY RFD (DISPOSABLE) IMPLANT
GAUZE SPONGE 4X4 12PLY STRL (GAUZE/BANDAGES/DRESSINGS) IMPLANT
GLOVE BIO SURGEON STRL SZ7 (GLOVE) IMPLANT
GLOVE BIO SURGEON STRL SZ7.5 (GLOVE) ×2 IMPLANT
GLOVE BIOGEL PI IND STRL 6.5 (GLOVE) IMPLANT
GLOVE BIOGEL PI IND STRL 7.0 (GLOVE) IMPLANT
GLOVE BIOGEL PI IND STRL 7.5 (GLOVE) ×1 IMPLANT
GLOVE BIOGEL PI INDICATOR 6.5 (GLOVE) ×2
GLOVE BIOGEL PI INDICATOR 7.0 (GLOVE) ×6
GLOVE BIOGEL PI INDICATOR 7.5 (GLOVE) ×8
GLOVE ECLIPSE 6.5 STRL STRAW (GLOVE) ×2 IMPLANT
GLOVE ECLIPSE 7.0 STRL STRAW (GLOVE) ×3 IMPLANT
GLOVE EXAM NITRILE XL STR (GLOVE) IMPLANT
GLOVE SURG SS PI 6.0 STRL IVOR (GLOVE) ×4 IMPLANT
GOWN STRL REUS W/ TWL LRG LVL3 (GOWN DISPOSABLE) ×2 IMPLANT
GOWN STRL REUS W/ TWL XL LVL3 (GOWN DISPOSABLE) IMPLANT
GOWN STRL REUS W/TWL 2XL LVL3 (GOWN DISPOSABLE) IMPLANT
GOWN STRL REUS W/TWL LRG LVL3 (GOWN DISPOSABLE) ×9
GOWN STRL REUS W/TWL XL LVL3 (GOWN DISPOSABLE) ×3
HEMOSTAT POWDER KIT SURGIFOAM (HEMOSTASIS) ×3 IMPLANT
KIT BASIN OR (CUSTOM PROCEDURE TRAY) ×3 IMPLANT
KIT TURNOVER KIT B (KITS) ×3 IMPLANT
NDL HYPO 18GX1.5 BLUNT FILL (NEEDLE) IMPLANT
NDL SPNL 18GX3.5 QUINCKE PK (NEEDLE) IMPLANT
NEEDLE HYPO 18GX1.5 BLUNT FILL (NEEDLE) IMPLANT
NEEDLE HYPO 22GX1.5 SAFETY (NEEDLE) ×3 IMPLANT
NEEDLE SPNL 18GX3.5 QUINCKE PK (NEEDLE) ×3 IMPLANT
NS IRRIG 1000ML POUR BTL (IV SOLUTION) ×3 IMPLANT
OIL CARTRIDGE MAESTRO DRILL (MISCELLANEOUS) ×3
PACK LAMINECTOMY NEURO (CUSTOM PROCEDURE TRAY) ×3 IMPLANT
PAD ARMBOARD 7.5X6 YLW CONV (MISCELLANEOUS) ×9 IMPLANT
RUBBERBAND STERILE (MISCELLANEOUS) ×6 IMPLANT
SPONGE LAP 4X18 RFD (DISPOSABLE) IMPLANT
SPONGE SURGIFOAM ABS GEL SZ50 (HEMOSTASIS) ×3 IMPLANT
STRIP CLOSURE SKIN 1/2X4 (GAUZE/BANDAGES/DRESSINGS) IMPLANT
SUT VIC AB 0 CT1 18XCR BRD8 (SUTURE) ×1 IMPLANT
SUT VIC AB 0 CT1 8-18 (SUTURE) ×3
SUT VIC AB 2-0 CT1 18 (SUTURE) IMPLANT
SUT VICRYL 3-0 RB1 18 ABS (SUTURE) ×8 IMPLANT
SYR 3ML LL SCALE MARK (SYRINGE) IMPLANT
TOWEL GREEN STERILE (TOWEL DISPOSABLE) ×3 IMPLANT
TOWEL GREEN STERILE FF (TOWEL DISPOSABLE) ×3 IMPLANT
WATER STERILE IRR 1000ML POUR (IV SOLUTION) ×3 IMPLANT

## 2018-10-11 NOTE — Anesthesia Postprocedure Evaluation (Signed)
Anesthesia Post Note  Patient: Morgan [name]  Procedure(s) Performed: LAMINECTOMY AND FORAMINOTOMY RIGHT LUMBAR FOUR- LUMBAR FIVE (Right Spine Lumbar)     Patient location during evaluation: PACU Anesthesia Type: General Level of consciousness: awake and alert Pain management: pain level controlled Vital Signs Assessment: post-procedure vital signs reviewed and stable Respiratory status: spontaneous breathing, nonlabored ventilation, respiratory function stable and patient connected to nasal cannula oxygen Cardiovascular status: blood pressure returned to baseline and stable Postop Assessment: no apparent nausea or vomiting Anesthetic complications: no    Last Vitals:  Vitals:   10/11/18 1515 10/11/18 1538  BP: 117/78 106/70  Pulse: 75 67  Resp: 14 12  Temp:  (!) 36.3 C  SpO2: 97% 99%    Last Pain:  Vitals:   10/11/18 1538  TempSrc:   PainSc: 4                  Donavon Kimrey L Brittanee Ghazarian

## 2018-10-11 NOTE — H&P (Signed)
Chief Complaint   Back pain  HPI   HPI: Morgan [name] is a 52 y.o. female with history of chronic back and primarily right-sided leg pain. She describes pain running from the right side were back through the right thigh and leg, with associated numbness and tingling of her right foot.  Although she has some mild tingling in her left leg, this is not nearly as problematic for her.  She says lying down does help her pain.  She says sitting, walking, or standing makes the pain worse.  She has not noted any changes in bowel or bladder function.  She has tried po medications, ESI and PT without relief. She presents today for surgery. She is without any concerns.   Of note, she has a history of gastric bypass surgery.  Due to severe abd pain, she initially had a difficult time tolerating po but has much better nutrition now. She has been given clearance by Dr Redmond Pulling who performed bariatric surgery, to proceed with NS. She has also been given clearance by GP Dr Whitney Muse.  Patient Active Problem List   Diagnosis Date Noted  . Atelectasis   . Hypoglycemia   . Hypotension   . Bradycardia   . Epigastric pain 06/19/2018  . H/O gastric bypass 12/20/2017  . History of adenomatous polyp of colon 10/23/2017  . Hyperlipidemia associated with type 2 diabetes mellitus ([place]) 07/06/2017  . GERD (gastroesophageal reflux disease) 03/21/2017  . S/P gastric bypass 03/21/2017  . Morbid obesity with BMI of 40.0-44.9, adult ([place]) 06/06/2016  . Intractable chronic migraine without aura 06/04/2015  . Abnormal uterine bleeding 05/13/2015  . Pancreatitis 02/27/2014  . Fatty liver disease, nonalcoholic 74/25/9563  . Bipolar disorder ([place]) 02/20/2014  . Metabolic syndrome 87/56/4332  . Vitamin D deficiency   . DM (diabetes mellitus) ([place]) 03/12/2013  . HLD (hyperlipidemia) 03/12/2013  . Preoperative evaluation to rule out surgical contraindication 06/12/2011  . Hypothyroidism   . Constipation, chronic   .  Vitamin B 12 deficiency   . ALLERGIC RHINITIS 12/31/2010  . BARRETTS ESOPHAGUS 12/31/2010  . Gastroparesis 12/31/2010  . Bilateral chronic knee pain 12/31/2010  . Obstructive sleep apnea 09/27/2010  . Migraine 09/27/2010  . Hypertension associated with diabetes ([place]) 09/27/2010  . RESPIRATORY FAILURE, CHRONIC 09/27/2010  . Cyst of ovary 10/19/2009    PMH: Past Medical History:  Diagnosis Date  . Allergic rhinitis   . Anxiety   . Barrett's esophagus   . Bipolar affective disorder ([place])   . CAP (community acquired pneumonia) 07/20/2018  . Chronic respiratory failure ([place])   . Constipation, chronic   . Degenerative joint disease of spine   . Depression   . Diabetes mellitus without complication ([place])    type 2   . Dry eye   . Family history of adverse reaction to anesthesia    nausea and vomiting  . Gastroparesis   . GERD (gastroesophageal reflux disease)   . History of bariatric surgery 03/2017  . History of hiatal hernia   . Hyperlipidemia   . Hypertension   . Intractable chronic migraine without aura 06/04/2015  . Migraine headache   . Morbid obesity ([place])   . OSA (obstructive sleep apnea)    uses a cpap with 2 units o2   . Osteoarthritis    bilateral knee  . Sleep apnea    has c-pap  . Unspecified hypothyroidism   . Vitamin B 12 deficiency   . Vitamin D deficiency  PSH: Past Surgical History:  Procedure Laterality Date  . APPENDECTOMY  1978  . CHOLECYSTECTOMY  2005  . COLONOSCOPY    . GASTRIC ROUX-EN-Y N/A 03/21/2017   Procedure: LAPAROSCOPIC ROUX-EN-Y GASTRIC, UPPER ENDO;  Surgeon: Greer Pickerel, MD;  [facility];  Service: General;  Laterality: N/A;  . GASTROSTOMY N/A 06/19/2018   Procedure: LAPRASCOPIC INSERTION OF GASTROSTOMY TUBE;  Surgeon: Greer Pickerel, MD;  [facility];  Service: General;  Laterality: N/A;  . GASTROSTOMY TUBE PLACEMENT Left    06/2018  . HERNIA REPAIR    . HIATAL HERNIA REPAIR N/A 06/19/2018   Procedure: LAPAROSCOPIC REPAIR OF  HIATAL HERNIA;  Surgeon: Greer Pickerel, MD;  [facility];  Service: General;  Laterality: N/A;  . LAPAROSCOPY N/A 06/19/2018   Procedure: LAPAROSCOPY DIAGNOSTIC;  Surgeon: Greer Pickerel, MD;  [facility];  Service: General;  Laterality: N/A;  . SHOULDER ARTHROSCOPY  7/12   left-dsc  . TONSILLECTOMY  at age 67  . TRIGGER FINGER RELEASE  12/20/2012   Procedure: RELEASE TRIGGER FINGER/A-1 PULLEY;  Surgeon: Tennis Must, MD;  Location: [place];  Service: Orthopedics;  Laterality: Left;  LEFT TRIGGER THUMB RELEASE    No medications prior to admission.    SH: Social History   Tobacco Use  . Smoking status: Never Smoker  . Smokeless tobacco: Never Used  Substance Use Topics  . Alcohol use: No  . Drug use: No    MEDS: Prior to Admission medications   Medication Sig Start Date End Date Taking? Authorizing Provider  AIMOVIG 140 MG/ML SOAJ INJECT 140MG  (2ML) ONCE A MONTH AS DIRECTED Patient taking differently: Inject 140 mg into the muscle every 30 (thirty) days.  07/16/18  Yes Kathrynn Ducking, MD  Calcium-Vitamin D-Vitamin K 650-12.5-40 MG-MCG-MCG CHEW Chew 1 tablet by mouth 2 (two) times daily.   Yes [provider]  Cyanocobalamin (VITAMIN B-12 IJ) Inject 1,000 mcg as directed every 30 (thirty) days.   Yes [provider]  diclofenac sodium (VOLTAREN) 1 % GEL Apply 2 g topically 2 (two) times daily as needed (JOINT PAIN). Use as directed for knee pain 10/02/15  Yes [provider]  ezetimibe (ZETIA) 10 MG tablet Take 1 tablet (10 mg total) by mouth at bedtime. 07/24/18  Yes Aline August, MD  fluticasone (FLONASE) 50 MCG/ACT nasal spray Place 2 sprays into both nostrils 2 (two) times daily as needed for allergies. 07/24/18  Yes Aline August, MD  gabapentin (NEURONTIN) 300 MG capsule Take 2 tabs PO in the AM, 1 tab at lunch and 2 tabs at bedtime. Patient taking differently: Take 300-600 mg by mouth See admin instructions. Take 600mg  by mouth  in the morning, 300mg  at lunch and 600mg  at bedtime. 08/22/18  Yes Ward Givens, NP  lipase/protease/amylase (CREON) 36000 UNITS CPEP capsule Take 36,000 Units by mouth 4 (four) times daily.    Yes [provider]  loratadine (CLARITIN) 10 MG tablet Take 10 mg by mouth daily as needed for allergies.    Yes [provider]  Multiple Vitamin (MULTIVITAMIN) capsule Take 1 capsule by mouth daily.    Yes [provider]  nystatin (NYAMYC) powder APPLY TOPICALLY TWICE A DAY AS NEEDED Patient taking differently: Apply 1 g topically 2 (two) times daily as needed (yeast).  07/06/17  Yes Timmothy Euler, MD  pantoprazole (PROTONIX) 40 MG tablet Take 1 tablet (40 mg total) by mouth daily. 09/14/18  Yes Gottschalk, Ashly M, DO  PAZEO 0.7 %  SOLN Place 1 drop into both eyes every morning. 06/10/16  Yes [provider]  polyethylene glycol (MIRALAX / GLYCOLAX) packet Take 17 g by mouth at bedtime as needed for mild constipation.    Yes [provider]  RESTASIS MULTIDOSE 0.05 % ophthalmic emulsion Place 1 drop into both eyes 2 (two) times daily. 06/17/16  Yes [provider]  tiZANidine (ZANAFLEX) 2 MG tablet Take 1 tablet (2 mg total) by mouth every 8 (eight) hours as needed for muscle spasms. 07/24/18  Yes Aline August, MD  traMADol (ULTRAM) 50 MG tablet Take 50 mg by mouth every 6 (six) hours.    Yes [provider]  ziprasidone (GEODON) 60 MG capsule Take 1 capsule (60 mg total) by mouth at bedtime. 08/15/18  Yes Gottschalk, Ashly M, DO  divalproex (DEPAKOTE) 500 MG DR tablet TAKE 1 TABLET EVERY MORNING AND 2 TABLETS AT BEDTIME 10/02/18   Ward Givens, NP  glucose blood (ONE TOUCH ULTRA TEST) test strip 1 each by Other route 2 (two) times daily as needed. CHECK BLOOD SUGAR TWICE A DAY AS DIRECTED 07/24/18   Aline August, MD  levothyroxine (SYNTHROID, LEVOTHROID) 112 MCG tablet Take 1 tablet (112 mcg total) by mouth daily before breakfast. 10/03/18    Ronnie Doss M, DO  sertraline (ZOLOFT) 100 MG tablet TAKE (2) TABLETS DAILY 10/02/18   Ronnie Doss M, DO    ALLERGY: Allergies  Allergen Reactions  . Ketoconazole Hives and Swelling    SWELLING REACTION UNSPECIFIED   . Pravachol [Pravastatin Sodium] Shortness Of Breath, Swelling and Anaphylaxis    Throat swelling  . Statins Other (See Comments)    Leg cramps  . Tape Dermatitis and Other (See Comments)  . Codeine Hypertension    increased BP    Social History   Tobacco Use  . Smoking status: Never Smoker  . Smokeless tobacco: Never Used  Substance Use Topics  . Alcohol use: No     Family History  Problem Relation Age of Onset  . Asthma Father   . Allergies Father   . Heart disease Father        enlarged heart  . Peripheral vascular disease Father   . Diabetes Father   . Hyperlipidemia Father   . Arthritis Father   . Asthma Sister   . Cancer Sister        colon at 85 yr old.  . Colon cancer Sister   . Allergies Mother   . Stroke Mother 61       with hemi paralysis  . Diabetes Mother   . Hyperlipidemia Mother   . Hypertension Mother   . GI problems Mother   . Arthritis Mother   . Allergies Brother   . Early death Brother 9       congenital abormality  . Allergies Sister   . Diabetes Sister   . Asthma Sister   . Colon polyps Sister   . Hyperlipidemia Sister   . GI problems Sister        gastroporesis   . Liver disease Sister        fatty liver  . Stroke Sister        intercrandial bleed  . Diabetes Brother   . Hypertension Brother   . Hyperlipidemia Brother   . Heart disease Paternal Aunt   . Migraines Neg Hx      ROS   ROS  Exam   There were no vitals filed for this visit. General appearance: WDWN,  NAD Eyes: No scleral injection Cardiovascular: Regular rate and rhythm without murmurs, rubs, gallops. No edema or variciosities. Distal pulses normal. Pulmonary: Effort normal, non-labored breathing Musculoskeletal:     Muscle  tone upper extremities: Normal    Muscle tone lower extremities: Normal    Motor exam: Upper Extremities Deltoid Bicep Tricep Grip  Right 5/5 5/5 5/5 5/5  Left 5/5 5/5 5/5 5/5   Lower Extremity IP Quad PF DF EHL  Right 5/5 5/5 5/5 5/5 5/5  Left 5/5 5/5 5/5 5/5 5/5   Neurological Mental Status:    - Patient is awake, alert, oriented to person, place, month, year, and situation    - Patient is able to give a clear and coherent history.    - No signs of aphasia or neglect Cranial Nerves    - II: Visual Fields are full. PERRL    - III/IV/VI: EOMI without ptosis or diploplia.     - V: Facial sensation is grossly normal    - VII: Facial movement is symmetric.     - VIII: hearing is intact to voice    - X: Uvula elevates symmetrically    - XI: Shoulder shrug is symmetric.    - XII: tongue is midline without atrophy or fasciculations.  Sensory: Sensation grossly intact to LT Deep Tendon Reflexes    - 2+ and symmetric in the biceps and patellae. Plantars   - Toes are downgoing bilaterally.  Cerebellar    - FNF and HKS are intact bilaterally  Results - Imaging/Labs   No results found for this or any previous visit (from the past 48 hour(s)).  No results found.  IMAGING: MRI of the lumbar spine dated 02/26/2018 was reviewed.  Primary findings at L4-5, where there is disc desiccation with loss of height and reactive endplate change.  There is right greater than left facet arthropathy, with right eccentric although broad-based disc bulge with resultant lateral recess and severe foraminal stenosis.  Impression/Plan   52 y.o. female with right-sided back and leg pain likely related to lumbar spondylosis with lateral recess and foraminal stenosis at L4-5.  At this point, she has failed multiple reasonable conservative treatment including medication therapy, and injection therapy. She presents today for right-sided L3-4 laminotomy foraminotomy. She has medical clearance.  While in the  office risks of the surgery were discussed in detail, including but not limited to bleeding, infection, nerve injury resulting in leg/foot weakness/numbness or bowel/bladder dysfunction, and CSF leak. Possible outcomes of surgery were also discussed including the possibility of persistence or worsening of pain symptoms. We discussed the possible need for additional spinal surgery in the future. The general risks of anesthesia were also reviewed including heart attack, stroke, and DVT/PE.    The patient understood our discussion, all her questions were answered. She wished to proceed.

## 2018-10-11 NOTE — Progress Notes (Signed)
Patient ambulated in PACU with minimal pain. States it is tolerable. Able to eat full [place] sandwich with no issues. Will proceed with discharge.

## 2018-10-11 NOTE — Op Note (Signed)
  NEUROSURGERY OPERATIVE NOTE   PREOP DIAGNOSIS: Lumbar stenosis with radiculopathy, L4-5 right  POSTOP DIAGNOSIS: Same  PROCEDURE: 1. Right L4-5 laminotomy with medial facetectomy and foraminotomy for decompression of nerve root 2. Use of operating microscope  SURGEON: Dr. Consuella Lose, MD  ASSISTANT: Dr. Ashok Pall, MD  ANESTHESIA: General Endotracheal  EBL: 50cc  SPECIMENS: None  DRAINS: None  COMPLICATIONS: None immediate  CONDITION: Hemodynamically stable to PACU  HISTORY: Morgan [name] is a 52 y.o. female with a history of chronic right-sided back and leg pain.  She underwent multiple different conservative treatments with minimal improvement.  Her MRI did demonstrate significant lateral recess stenosis at L4-5 on the right.  She therefore elected to proceed with surgical decompression.  The risks and benefits of the surgery were explained in detail to the patient and her family.  After all questions were answered informed consent was obtained and witnessed.  PROCEDURE IN DETAIL: The patient was brought to the operating room via [place]. After induction of general anesthesia, the patient was positioned on the operative table in the prone position with all pressure points meticulously padded. The skin of the low back was then prepped and draped in the usual sterile fashion.  Under xray, the correct level was identified and marked out on the skin, and after timeout was conducted, the skin was infiltrated with local anesthetic. Skin incision was then made sharply and Bovie electrocautery was used to dissect the subcutaneous tissue until the lumbodorsal fascia was identified. The fascia was then incised using Bovie electrocautery and the lamina at the L4 and L5 levels was identified and dissection was carried out in the subperiosteal plane. Self-retaining retractor was then placed, and intraoperative x-ray was taken to confirm we were at the correct level.  Using a  high-speed drill, laminotomy was completed with a partial medial facetectomy. The ligamentum flavum was then identified and removed and the lateral edge of the thecal sac was identified. This was then traced down to identify the traversing nerve root. I then proceeded with removal of the medial part of the right L4 facet complex.  I was able to identify the compression of the right L5 nerve root which appeared to be from hypertrophy of the facet complex as well as some ligamentous hypertrophy.  Once a medial facetectomy was completed, foraminotomy was completed with a Kerrison Rogers.  Once this was completed, I was able to freely pass a ball-tipped dissector both ventral and dorsal to the right L5 nerve root indicating good decompression.  Hemostasis was then secured using a combination of morcellized Gelfoam and thrombin and bipolar electrocautery. The wound was irrigated with copious amounts of antibiotic saline irrigation. The nerve root was then covered with a long-acting steroid solution. Self-retaining retractor was then removed, and the wound is closed in layers using a combination of interrupted 0 Vicryl and 3-0 Vicryl stitches. The skin was closed using standard skin glue.  At the end of the case all sponge, needle, and instrument counts were correct. The patient was then transferred to the stretcher and taken to the postanesthesia care unit in stable hemodynamic condition.

## 2018-10-11 NOTE — Anesthesia Procedure Notes (Signed)
Procedure Name: Intubation Date/Time: 10/11/2018 1:06 PM Performed by: Inda Coke, CRNA Pre-anesthesia Checklist: Patient identified, Emergency Drugs available, Suction available and Patient being monitored Patient Re-evaluated:Patient Re-evaluated prior to induction Oxygen Delivery Method: Circle System Utilized Preoxygenation: Pre-oxygenation with 100% oxygen Induction Type: IV induction Ventilation: Mask ventilation without difficulty Laryngoscope Size: Mac and 3 Grade View: Grade I Tube type: Oral Tube size: 7.0 mm Number of attempts: 1 Airway Equipment and Method: Stylet and Oral airway Placement Confirmation: ETT inserted through vocal cords under direct vision,  positive ETCO2 and breath sounds checked- equal and bilateral Secured at: 21 cm Tube secured with: Tape Dental Injury: Teeth and Oropharynx as per pre-operative assessment

## 2018-10-11 NOTE — Transfer of Care (Signed)
Immediate Anesthesia Transfer of Care Note  Patient: Morgan [name]  Procedure(s) Performed: LAMINECTOMY AND FORAMINOTOMY RIGHT LUMBAR FOUR- LUMBAR FIVE (Right Spine Lumbar)  Patient Location: PACU  Anesthesia Type:General  Level of Consciousness: awake, alert  and oriented  Airway & Oxygen Therapy: Patient Spontanous Breathing and Patient connected to nasal cannula oxygen  Post-op Assessment: Report given to RN, Post -op Vital signs reviewed and stable and Patient moving all extremities X 4  Post vital signs: Reviewed and stable  Last Vitals:  Vitals Value Taken Time  BP 123/73 10/11/2018  2:30 PM  Temp 36.3 C 10/11/2018  2:30 PM  Pulse 72 10/11/2018  2:34 PM  Resp 10 10/11/2018  2:34 PM  SpO2 94 % 10/11/2018  2:34 PM  Vitals shown include unvalidated device data.  Last Pain:  Vitals:   10/11/18 1430  TempSrc:   PainSc: 4       Patients Stated Pain Goal: 3 (49/67/59 1638)  Complications: No apparent anesthesia complications

## 2018-10-11 NOTE — Progress Notes (Signed)
Called Dr Kathyrn Sheriff regarding patient discharging from PACU as beds on 3c are limited. Agreeable to discharge from PACU if patients pain tolerable,  able to ambulate and eat with no issues. Patient is wanting to go home from PACU

## 2018-10-11 NOTE — Discharge Summary (Signed)
Physician Discharge Summary  Patient ID: JAIMELYNN [name] MRN: [id number] DOB/AGE: 02/10/1966 52 y.o.  Admit date: 10/11/2018 Discharge date: 10/11/2018  Admission Diagnoses:  Lumbar radiculopathy  Discharge Diagnoses:  Same Active Problems:   Lumbar radiculopathy   Discharged Condition: Stable  Hospital Course:  Morgan [name] is a 52 y.o. female who was admitted for the below procedure. There were no post operative complications. At time of discharge, pain was well controlled, ambulating with Pt/OT, tolerating po, voiding normal. Ready for discharge.  Treatments: Surgery - L4-5 laminotomy and foraminotomy  Discharge Exam: Blood pressure 111/68, pulse 64, temperature 97.6 F (36.4 C), temperature source Oral, resp. rate 20, height 5\' 4"  (1.626 m), weight 57.3 kg, last menstrual period 10/17/2017, SpO2 100 %. Awake, alert, oriented Speech fluent, appropriate CN grossly intact 5/5 BUE/BLE Wound c/d/i  Disposition: Discharge disposition: 01-Home or Self Care       Discharge Instructions    Call MD for:  difficulty breathing, headache or visual disturbances   Complete by:  As directed    Call MD for:  persistant dizziness or light-headedness   Complete by:  As directed    Call MD for:  redness, tenderness, or signs of infection (pain, swelling, redness, odor or green/yellow discharge around incision site)   Complete by:  As directed    Call MD for:  severe uncontrolled pain   Complete by:  As directed    Call MD for:  temperature >100.4   Complete by:  As directed    Diet general   Complete by:  As directed    Driving Restrictions   Complete by:  As directed    Do not drive until given clearance.   Increase activity slowly   Complete by:  As directed    Lifting restrictions   Complete by:  As directed    Do not lift anything >10lbs. Avoid bending and twisting in awkward positions. Avoid bending at the back.   May shower / Bathe   Complete by:  As  directed    In 24 hours. Okay to wash wound with warm soapy water. Avoid scrubbing the wound. Pat dry.   Remove dressing in 24 hours   Complete by:  As directed      Allergies as of 10/11/2018      Reactions   Ketoconazole Hives, Swelling   SWELLING REACTION UNSPECIFIED    Pravachol [pravastatin Sodium] Shortness Of Breath, Swelling, Anaphylaxis   Throat swelling   Statins Other (See Comments)   Leg cramps   Tape Dermatitis, Other (See Comments)   Codeine Hypertension   increased BP      Medication List    TAKE these medications   AIMOVIG 140 MG/ML Soaj Generic drug:  Erenumab-aooe INJECT 140MG  ( ) ONCE A MONTH AS DIRECTED What changed:  See the new instructions.   Calcium-Vitamin D-Vitamin K 650-12.5-40 MG-MCG-MCG Chew Chew 1 tablet by mouth 2 (two) times daily.   diclofenac sodium 1 % Gel Commonly known as:  VOLTAREN Apply 2 g topically 2 (two) times daily as needed (JOINT PAIN). Use as directed for knee pain   divalproex 500 MG DR tablet Commonly known as:  DEPAKOTE TAKE 1 TABLET EVERY MORNING AND 2 TABLETS AT BEDTIME   ezetimibe 10 MG tablet Commonly known as:  ZETIA Take 1 tablet (10 mg total) by mouth at bedtime.   fluticasone 50 MCG/ACT nasal spray Commonly known as:  FLONASE Place 2 sprays into both nostrils 2 (two) times daily as  needed for allergies.   gabapentin 300 MG capsule Commonly known as:  NEURONTIN Take 2 tabs PO in the AM, 1 tab at lunch and 2 tabs at bedtime. What changed:    how much to take  how to take this  when to take this  additional instructions   glucose blood test strip 1 each by Other route 2 (two) times daily as needed. CHECK BLOOD SUGAR TWICE A DAY AS DIRECTED   levothyroxine 112 MCG tablet Commonly known as:  SYNTHROID, LEVOTHROID Take 1 tablet (112 mcg total) by mouth daily before breakfast.   lipase/protease/amylase 53664 UNITS Cpep capsule Commonly known as:  CREON Take 36,000 Units by mouth 4 (four) times  daily.   loratadine 10 MG tablet Commonly known as:  CLARITIN Take 10 mg by mouth daily as needed for allergies.   multivitamin capsule Take 1 capsule by mouth daily.   nystatin powder Commonly known as:  MYCOSTATIN/NYSTOP APPLY TOPICALLY TWICE A DAY AS NEEDED What changed:    how much to take  how to take this  when to take this  reasons to take this  additional instructions   oxyCODONE-acetaminophen 7.5-325 MG tablet Commonly known as:  PERCOCET Take 1 tablet by mouth every 4 (four) hours as needed.   pantoprazole 40 MG tablet Commonly known as:  PROTONIX Take 1 tablet (40 mg total) by mouth daily.   PAZEO 0.7 % Soln Generic drug:  Olopatadine HCl Place 1 drop into both eyes every morning.   polyethylene glycol packet Commonly known as:  MIRALAX / GLYCOLAX Take 17 g by mouth at bedtime as needed for mild constipation.   RESTASIS MULTIDOSE 0.05 % ophthalmic emulsion Generic drug:  cycloSPORINE Place 1 drop into both eyes 2 (two) times daily.   sertraline 100 MG tablet Commonly known as:  ZOLOFT TAKE (2) TABLETS DAILY   tiZANidine 2 MG tablet Commonly known as:  ZANAFLEX Take 1 tablet (2 mg total) by mouth every 8 (eight) hours as needed for muscle spasms.   traMADol 50 MG tablet Commonly known as:  ULTRAM Take 50 mg by mouth every 6 (six) hours.   VITAMIN B-12 IJ Inject 1,000 mcg as directed every 30 (thirty) days.   ziprasidone 60 MG capsule Commonly known as:  GEODON Take 1 capsule (60 mg total) by mouth at bedtime.      Follow-up Information    Lisbeth Renshaw, MD Follow up.   Specialty:  Neurosurgery Contact information: [address] [place] [address] [phone]           Signed: Alyson Ingles 10/11/2018, 2:23 PM

## 2018-10-12 ENCOUNTER — Encounter (HOSPITAL_COMMUNITY): Payer: Self-pay | Admitting: Neurosurgery

## 2018-10-22 ENCOUNTER — Ambulatory Visit: Payer: Medicare Other | Admitting: Adult Health

## 2018-11-05 ENCOUNTER — Ambulatory Visit (INDEPENDENT_AMBULATORY_CARE_PROVIDER_SITE_OTHER): Payer: Medicare Other | Admitting: *Deleted

## 2018-11-05 DIAGNOSIS — E538 Deficiency of other specified B group vitamins: Secondary | ICD-10-CM

## 2018-11-05 NOTE — Progress Notes (Signed)
Pt given Cyanocobalamin inj Tolerated well 

## 2018-11-06 ENCOUNTER — Encounter: Payer: Medicare Other | Attending: General Surgery | Admitting: Skilled Nursing Facility1

## 2018-11-06 ENCOUNTER — Encounter: Payer: Self-pay | Admitting: Skilled Nursing Facility1

## 2018-11-06 DIAGNOSIS — Z9884 Bariatric surgery status: Secondary | ICD-10-CM | POA: Diagnosis not present

## 2018-11-06 DIAGNOSIS — R140 Abdominal distension (gaseous): Secondary | ICD-10-CM | POA: Insufficient documentation

## 2018-11-06 DIAGNOSIS — R109 Unspecified abdominal pain: Secondary | ICD-10-CM | POA: Insufficient documentation

## 2018-11-06 DIAGNOSIS — Z6821 Body mass index (BMI) 21.0-21.9, adult: Secondary | ICD-10-CM | POA: Insufficient documentation

## 2018-11-06 DIAGNOSIS — Z713 Dietary counseling and surveillance: Secondary | ICD-10-CM | POA: Insufficient documentation

## 2018-11-06 DIAGNOSIS — E119 Type 2 diabetes mellitus without complications: Secondary | ICD-10-CM

## 2018-11-06 NOTE — Progress Notes (Signed)
RYGB  Pt arrives having maintained weight. Pt states she had back surgery on halloween. Pt states she will not do the tanita because she cannot bend over. Pt states she tried pureeing her food for 2 days and felt she had the same pain so the taste was not worth the same pain.  Pt states her gastroparesis test came back negative. Pt states she will be getting a small bowel test. Pt states she finds the coffee to help a little bit with her bowel movement. Pt states she finds the soy protein isolate to be more tolerable than the whey protein options. Pt states she has been eating bread because she cannot lift anything so her niece has been making her sandwiches; The sandwiches do not hurt any worse than the other foods. Pt was in the appointment randomly gasping for air stating she is in pain in her back. Pt states she checks her blood sugar every night at 6-7pm and will eat something if it is 95 or less. Pt states she checked her blood sugar at 10am (having eaten peanut butter captain crunch at 3:30am and then a protein bar at 8am) because she was feeling shaky and it was 62 so she ate and another day it was down to 60 for that day having eggs and bologna at 4am with the low blood sugar being about 6-7am.  Pts A1C 5.0  Surgery date: 03/21/2017 Surgery type: RYGB Start weight at [facility]: 312.5 Weight today:127.2  TANITA  BODY COMP RESULTS  06/15/2017 09/18/2017 01/22/2018 09/19/2018   BMI (kg/m^2) 37.8 31.8 25.5 21.8   Fat Mass (lbs) 100.2 65.4 44.2 23.8   Fat Free Mass (lbs) 120.2 119.6 104.4 103.4   Total Body Water (lbs) 87 85 72.8 71.2   24-hr recall: a protein bar every day (10g) B (AM): around 3am 1-2 eggs and hotdog  Snack: around 4-5am: 1 scoop soy protein isolate in caffinated 8 ounce coffee L (PM): hamburger helped and mashed potatoes  Snk (PM): 1 scoop soy protein isolate in caffinated 8 ounce coffee                       D (PM): hamburger helped and mashed potatoes  Or peanut butter  sandwich   Snk (PM): 2 peanut butter craham crackers and ice cream   Fluid intake: water, unsweet tea: 46 ounces a day minimum (67 fluid ounces total) Estimated total protein intake: 60  Medications: taking Carafate and protonix  Supplementation: procare health capsule with food and calcium and b12 shot every month  CBG monitoring: 2 times a day Average CBG per patient: 60, 65, 95, 86, 101, 76 Last patient reported A1c: 5.2  Using straws: no Drinking while eating: no Having you been chewing well: yes Chewing/swallowing difficulties: no Changes in vision: no Changes to mood/headaches: no Hair loss/C[place]es to skin/Changes to nails: more hair coming out and thinner  Any difficulty focusing or concentrating: no Sweating: no Dizziness/Lightheaded: no Palpitations: no  Carbonated beverages: no N/V/D/C/GAS: taking miralax daily, having a bowel movement every other day Abdominal Pain: no Dumping syndrome: possible  Recent physical activity:  Water aerobics 3 days week 60 minutes walking in the house  Progress Towards Goal(s):  In progress.  Handouts given during visit include:  NS veggies + protein   Nutritional Diagnosis:  Roberts-3.3 Overweight/obesity related to past poor dietary habits and physical inactivity as evidenced by patient w/ recent RYGB surgery following dietary guidelines for continued weight loss.  Intervention:  Nutrition counseling. Dietitian educated the pt on advancing her diet to include ns veggies while having enough room to add more fluid into her day. Goals: -With breakfast have 1 slice of whole wheat bread or 1 clementine or half a banana or half an apple, etc. -Have 3 protein coffee drinks a day  -Keep up the great work!!  Teaching Method Utilized:  Visual Auditory Hands on  Barriers to learning/adherence to lifestyle change:abdominal pain  Demonstrated degree of understanding via:  Teach Back   Monitoring/Evaluation:  Dietary intake, exercise,  and body weight.

## 2018-11-06 NOTE — Patient Instructions (Addendum)
-  With breakfast have 1 slice of whole wheat bread or 1 clementine or half a banana or half an apple, etc.  -Have 3 protein coffee drinks a day   -Keep up the great work!!

## 2018-11-07 ENCOUNTER — Ambulatory Visit: Payer: Self-pay | Admitting: Neurology

## 2018-11-14 ENCOUNTER — Telehealth: Payer: Self-pay | Admitting: *Deleted

## 2018-11-14 NOTE — Telephone Encounter (Signed)
Completed PA on Cover my Meds for Aimovig 140 mg. Continuation PA. KEY: [id number]. Anticipate determination within 72 hours.

## 2018-11-15 DIAGNOSIS — N281 Cyst of kidney, acquired: Secondary | ICD-10-CM | POA: Diagnosis not present

## 2018-11-15 DIAGNOSIS — R109 Unspecified abdominal pain: Secondary | ICD-10-CM | POA: Diagnosis not present

## 2018-11-15 DIAGNOSIS — R1033 Periumbilical pain: Secondary | ICD-10-CM | POA: Diagnosis not present

## 2018-11-16 NOTE — Telephone Encounter (Signed)
Fax received from [employer]. Aimovig 140mg /ml is approved thru 12/12/19 under pt's Medicare Part D benefit. Pt. ID: [id number]. Ref: [id number]

## 2018-11-19 DIAGNOSIS — M25561 Pain in right knee: Secondary | ICD-10-CM | POA: Diagnosis not present

## 2018-11-19 DIAGNOSIS — M25562 Pain in left knee: Secondary | ICD-10-CM | POA: Diagnosis not present

## 2018-11-20 ENCOUNTER — Ambulatory Visit: Payer: Medicare Other | Admitting: Neurology

## 2018-11-20 ENCOUNTER — Encounter: Payer: Self-pay | Admitting: Neurology

## 2018-11-20 VITALS — BP 110/58 | HR 68 | Ht 64.0 in | Wt 131.0 lb

## 2018-11-20 DIAGNOSIS — G43711 Chronic migraine without aura, intractable, with status migrainosus: Secondary | ICD-10-CM | POA: Diagnosis not present

## 2018-11-20 MED ORDER — ONDANSETRON HCL 4 MG PO TABS: 4.0000 mg | ORAL_TABLET | Freq: Three times a day (TID) | ORAL | 11 refills | Status: DC

## 2018-11-20 MED ORDER — FREMANEZUMAB-VFRM 225 MG/1.5ML ~~LOC~~ SOSY: 140.0000 mg | PREFILLED_SYRINGE | SUBCUTANEOUS | 11 refills | Status: DC

## 2018-11-20 NOTE — Progress Notes (Signed)
[facility]    Provider:  Dr Jaynee Eagles Referring Provider: Janora Norlander, DO Primary Care Physician:  Janora Norlander, DO  CC:  migraines  HPI:  Morgan [name] is a 52 y.o. female here for follow up of migraines.  She has a past medical history of Barrett's esophagus, bipolar affective disorder, community-acquired pneumonia in August of this year, chronic respiratory failure, constipation, diabetes, dry eyes, gastroparesis, GERD, history of bariatric surgery, hyperlipidemia, hypertension, intractable chronic migraine without aura, obstructive sleep apnea, hypothyroidism, B12 deficiency, vitamin D deficiency.    This is the first time I am seeing her.  She has been a patient of our practice and is come to me today for another opinion.  Patient has a history of chronic migraines.  She has tried and failed multiple medications.  Initially she was started on Aimovig and her headaches improved.  She had 2 falls in August 2019 she went to the hospital and CT scan of the head was unremarkable.  Since then she has daily headaches.  She had minimal benefit with a steroid Dosepak.  Her headaches typically start around midday occurs all over the head typically a squeezing pounding sensation.  She reports associated photophobia and phonophobia as well as nausea.  She does not have auras with most migraines.  However she does sometimes see spots in the right eye.  Unclear what this is if it is an aura or not.  She also reports associated dizziness.  She takes tramadol every 6 hours if she does not take tramadol she takes Tylenol.  She also had gastric bypass and she has lost a significant amount of weight, feeding tube had to be placed because of malnutrition.  Initially after Aimovig she had a significant decrease in her headaches from having a daily headache down to 10 days a month.  The second month it improved to 8 headaches a month from daily. She is still on the aimovig. Has daily  hedaches. She is not overtaking tylenol or tramadol. They are daily migraines. She wakes with the headaches. MRI of the brain was normal. Pressure/pulsating/pounding. Light and sound sensitivity. Migraines are moderately severe to severe. No medication overuse. Movement makes it worse. +nausea. She is compliant. Her levels of depakote and gabapentin were fine. She has a detailed migraine diary. Last 24 hours - 72 hours.   Meds tried: Gabapentin, depakote, tizanidine, zoloft, botox for migraine x 4. Aimovig x 4. And many other meds.   Review of Systems: Patient complains of symptoms per HPI as well as the following symptoms: headaches. Pertinent negatives and positives per HPI. All others negative.   Social History   Socioeconomic History  . Marital status: Single    Spouse name: Not on file  . Number of children: 0  . Years of education: HS  . Highest education level: 12th grade  Occupational History  . Occupation: disabled    Fish farm manager: UNEMPLOYED  Social Needs  . Financial resource strain: Not hard at all  . Food insecurity:    Worry: Never true    Inability: Never true  . Transportation needs:    Medical: No    Non-medical: No  Tobacco Use  . Smoking status: Never Smoker  . Smokeless tobacco: Never Used  Substance and Sexual Activity  . Alcohol use: No  . Drug use: No  . Sexual activity: Never    Birth control/protection: None  Lifestyle  . Physical activity:    Days per week: 0 days  Minutes per session: 0 min  . Stress: Not at all  Relationships  . Social connections:    Talks on phone: More than three times a week    Gets together: More than three times a week    Attends religious service: More than 4 times per year    Active member of club or organization: No    Attends meetings of clubs or organizations: Never    Relationship status: Never married  . Intimate partner violence:    Fear of current or ex partner: No    Emotionally abused: No    Physically  abused: No    Forced sexual activity: No  Other Topics Concern  . Not on file  Social History Narrative   Patient is right handed.   Patient drinks 2 glasses of caffeine daily.   Lives at home. Her niece is living with her right now.    Family History  Problem Relation Age of Onset  . Asthma Father   . Allergies Father   . Heart disease Father        enlarged heart  . Peripheral vascular disease Father   . Diabetes Father   . Hyperlipidemia Father   . Arthritis Father   . Asthma Sister   . Cancer Sister        colon at 75 yr old.  . Colon cancer Sister   . Allergies Mother   . Stroke Mother 59       with hemi paralysis  . Diabetes Mother   . Hyperlipidemia Mother   . Hypertension Mother   . GI problems Mother   . Arthritis Mother   . Allergies Brother   . Early death Brother 9       congenital abormality  . Allergies Sister   . Diabetes Sister   . Asthma Sister   . Colon polyps Sister   . Hyperlipidemia Sister   . GI problems Sister        gastroporesis   . Liver disease Sister        fatty liver  . Stroke Sister        intercrandial bleed  . Diabetes Brother   . Hypertension Brother   . Hyperlipidemia Brother   . Heart disease Paternal Aunt   . Migraines Neg Hx     Past Medical History:  Diagnosis Date  . Allergic rhinitis   . Anxiety   . Barrett's esophagus   . Bipolar affective disorder ([place])   . CAP (community acquired pneumonia) 07/20/2018  . Chronic respiratory failure ([place])   . Constipation, chronic   . Degenerative joint disease of spine   . Depression   . Diabetes mellitus without complication ([place])    type 2   . Dry eye   . Family history of adverse reaction to anesthesia    nausea and vomiting  . Gastroparesis   . GERD (gastroesophageal reflux disease)   . History of bariatric surgery 03/2017  . History of hiatal hernia   . Hyperlipidemia   . Hypertension   . Intractable chronic migraine without aura 06/04/2015  . Migraine headache     . Morbid obesity ([place])   . OSA (obstructive sleep apnea)    uses a cpap with 2 units o2   . Osteoarthritis    bilateral knee  . Sleep apnea    has c-pap  . Unspecified hypothyroidism   . Vitamin B 12 deficiency   . Vitamin D deficiency  Past Surgical History:  Procedure Laterality Date  . APPENDECTOMY  1978  . CHOLECYSTECTOMY  2005  . COLONOSCOPY    . GASTRIC ROUX-EN-Y N/A 03/21/2017   Procedure: LAPAROSCOPIC ROUX-EN-Y GASTRIC, UPPER ENDO;  Surgeon: Greer Pickerel, MD;  Location: [facility];  Service: General;  Laterality: N/A;  . GASTROSTOMY N/A 06/19/2018   Procedure: LAPRASCOPIC INSERTION OF GASTROSTOMY TUBE;  Surgeon: Greer Pickerel, MD;  Location: [facility];  Service: General;  Laterality: N/A;  . GASTROSTOMY TUBE PLACEMENT Left    06/2018  . HERNIA REPAIR    . HIATAL HERNIA REPAIR N/A 06/19/2018   Procedure: LAPAROSCOPIC REPAIR OF HIATAL HERNIA;  Surgeon: Greer Pickerel, MD;  Location: [facility];  Service: General;  Laterality: N/A;  . LAPAROSCOPY N/A 06/19/2018   Procedure: LAPAROSCOPY DIAGNOSTIC;  Surgeon: Greer Pickerel, MD;  Location: [facility];  Service: General;  Laterality: N/A;  . LUMBAR LAMINECTOMY/DECOMPRESSION MICRODISCECTOMY Right 10/11/2018   Procedure: LAMINECTOMY AND FORAMINOTOMY RIGHT LUMBAR FOUR- LUMBAR FIVE;  Surgeon: Consuella Lose, MD;  Location: [facility];  Service: Neurosurgery;  Laterality: Right;  . SHOULDER ARTHROSCOPY  7/12   left-dsc  . TONSILLECTOMY  at age 63  . TRIGGER FINGER RELEASE  12/20/2012   Procedure: RELEASE TRIGGER FINGER/A-1 PULLEY;  Surgeon: Tennis Must, MD;  Location: [facility];  Service: Orthopedics;  Laterality: Left;  LEFT TRIGGER THUMB RELEASE    Current Outpatient Medications  Medication Sig Dispense Refill  . AIMOVIG 140 MG/ML SOAJ INJECT 140MG  (2ML) ONCE A MONTH AS DIRECTED (Patient taking differently: Inject 140 mg into the muscle every 30 (thirty) days. ) 1 pen 5  . Calcium-Vitamin D-Vitamin K 650-12.5-40 MG-MCG-MCG CHEW Chew 1  tablet by mouth 2 (two) times daily.    . Cyanocobalamin (VITAMIN B-12 IJ) Inject 1,000 mcg as directed every 30 (thirty) days.    . diclofenac sodium (VOLTAREN) 1 % GEL Apply 2 g topically 2 (two) times daily as needed (JOINT PAIN). Use as directed for knee pain    . divalproex (DEPAKOTE) 500 MG DR tablet TAKE 1 TABLET EVERY MORNING AND 2 TABLETS AT BEDTIME 90 tablet 5  . ezetimibe (ZETIA) 10 MG tablet Take 1 tablet (10 mg total) by mouth at bedtime.    . fluticasone (FLONASE) 50 MCG/ACT nasal spray Place 2 sprays into both nostrils 2 (two) times daily as needed for allergies.    Marland Kitchen gabapentin (NEURONTIN) 300 MG capsule Take 2 tabs PO in the AM, 1 tab at lunch and 2 tabs at bedtime. (Patient taking differently: Take 300-600 mg by mouth See admin instructions. Take 600mg  by mouth in the morning, 300mg  at lunch and 600mg  at bedtime.) 450 capsule 3  . glucose blood (ONE TOUCH ULTRA TEST) test strip 1 each by Other route 2 (two) times daily as needed. CHECK BLOOD SUGAR TWICE A DAY AS DIRECTED    . levothyroxine (SYNTHROID, LEVOTHROID) 112 MCG tablet Take 1 tablet (112 mcg total) by mouth daily before breakfast. 30 tablet 2  . lipase/protease/amylase (CREON) 36000 UNITS CPEP capsule Take 36,000 Units by mouth 4 (four) times daily.     Marland Kitchen loratadine (CLARITIN) 10 MG tablet Take 10 mg by mouth daily as needed for allergies.     . Multiple Vitamin (MULTIVITAMIN) capsule Take 1 capsule by mouth daily.     Marland Kitchen nystatin (NYAMYC) powder APPLY TOPICALLY TWICE A DAY AS NEEDED (Patient taking differently: Apply 1 g topically 2 (two) times daily as needed (yeast). ) 30 g 0  . oxyCODONE-acetaminophen (PERCOCET)  7.5-325 MG tablet Take 1 tablet by mouth every 4 (four) hours as needed. 60 tablet 0  . pantoprazole (PROTONIX) 40 MG tablet Take 1 tablet (40 mg total) by mouth daily. 90 tablet 1  . PAZEO 0.7 % SOLN Place 1 drop into both eyes every morning.    . polyethylene glycol (MIRALAX / GLYCOLAX) packet Take 17 g by  mouth at bedtime as needed for mild constipation.     . RESTASIS MULTIDOSE 0.05 % ophthalmic emulsion Place 1 drop into both eyes 2 (two) times daily.    . sertraline (ZOLOFT) 100 MG tablet TAKE (2) TABLETS DAILY 180 tablet 0  . tiZANidine (ZANAFLEX) 2 MG tablet Take 1 tablet (2 mg total) by mouth every 8 (eight) hours as needed for muscle spasms.    . traMADol (ULTRAM) 50 MG tablet Take 50 mg by mouth every 6 (six) hours.     . ziprasidone (GEODON) 60 MG capsule Take 1 capsule (60 mg total) by mouth at bedtime. 30 capsule 2  . Fremanezumab-vfrm (AJOVY) 225 MG/1.5ML SOSY Inject 140 mg into the skin every 30 (thirty) days. 1 Syringe 11  . ondansetron (ZOFRAN) 4 MG tablet Take 1 tablet (4 mg total) by mouth every 8 (eight) hours. 90 tablet 11   Current Facility-Administered Medications  Medication Dose Route Frequency Provider Last Rate Last Dose  . cyanocobalamin ((VITAMIN B-12)) injection 1,000 mcg  1,000 mcg Intramuscular Q30 days Ronnie Doss M, DO   1,000 mcg at 11/05/18 1530    Allergies as of 11/20/2018 - Review Complete 11/20/2018  Allergen Reaction Noted  . Ketoconazole Hives and Swelling   . Pravachol [pravastatin sodium] Shortness Of Breath, Swelling, and Anaphylaxis 11/13/2013  . Statins Other (See Comments)   . Tape Dermatitis and Other (See Comments)   . Codeine Hypertension     Vitals: BP (!) 110/58 (BP Location: Right Arm, Patient Position: Sitting)   Pulse 68   Ht 5\' 4"  (1.626 m)   Wt 131 lb (59.4 kg)   LMP 10/17/2017 (Approximate)   BMI 22.49 kg/m  Last Weight:  Wt Readings from Last 1 Encounters:  11/20/18 131 lb (59.4 kg)   Last Height:   Ht Readings from Last 1 Encounters:  11/20/18 5\' 4"  (1.626 m)   Physical exam: Exam: Gen: NAD, conversant, well nourised, thin, well groomed                     CV: RRR, no MRG. No Carotid Bruits. No peripheral edema, warm, nontender Eyes: Conjunctivae clear without exudates or hemorrhage  Neuro: Detailed  Neurologic Exam  Speech:    Speech is normal; fluent and spontaneous with normal comprehension.  Cognition:    The patient is oriented to person, place, and time;     recent and remote memory intact;     language fluent;     normal attention, concentration,     fund of knowledge Cranial Nerves:    The pupils are equal, round, and reactive to light. The fundi are normal and spontaneous venous pulsations are present. Visual fields are full to finger confrontation. Extraocular movements are intact. Trigeminal sensation is intact and the muscles of mastication are normal. The face is symmetric. The palate elevates in the midline. Hearing intact. Voice is normal. Shoulder shrug is normal. The tongue has normal motion without fasciculations.   Coordination:    Normal finger to nose and heel to shin. Normal rapid alternating movements.   Gait:  antalgic  Motor  Observation:     no involuntary movements noted. Tone:    Normal muscle tone.    Posture:    Posture is normal. normal erect    Strength:    Strength is V/V in the upper and lower limbs.      Sensation: intact to LT        Assessment/Plan: Lovely 52 year old with chronic intractable migraines  - Stop Aimovig due to failure and Start Ajovy - Zofran 3x a day -email me in 2 weeks and give me an update  Discussed: To prevent or relieve headaches, try the following: Cool Compress. Lie down and place a cool compress on your head.  Avoid headache triggers. If certain foods or odors seem to have triggered your migraines in the past, avoid them. A headache diary might help you identify triggers.  Include physical activity in your daily routine. Try a daily walk or other moderate aerobic exercise.  Manage stress. Find healthy ways to cope with the stressors, such as delegating tasks on your to-do list.  Practice relaxation techniques. Try deep breathing, yoga, massage and visualization.  Eat regularly. Eating regularly scheduled  meals and maintaining a healthy diet might help prevent headaches. Also, drink plenty of fluids.  Follow a regular sleep schedule. Sleep deprivation might contribute to headaches Consider biofeedback. With this mind-body technique, you learn to control certain bodily functions - such as muscle tension, heart rate and blood pressure - to prevent headaches or reduce headache pain.    Proceed to emergency room if you experience new or worsening symptoms or symptoms do not resolve, if you have new neurologic symptoms or if headache is severe, or for any concerning symptom.   Provided education and documentation from American headache Society toolbox including articles on: chronic migraine medication overuse headache, chronic migraines, prevention of migraines, behavioral and other nonpharmacologic treatments for headache.   Sarina Ill, MD  [facility] [address] [place], [place] [address]  Phone [phone] Fax [phone]  A total of 25 minutes was spent face-to-face with this patient. Over half this time was spent on counseling patient on the  1. Chronic migraine without aura, with intractable migraine, so stated, with status migrainosus     diagnosis and different diagnostic and therapeutic options, counseling and coordination of care, risks ans benefits of management, compliance, risk factor reduction and education.

## 2018-11-20 NOTE — Patient Instructions (Signed)
Stop AImovig Start Ajovy Zofran (ondansetron) 3x a day email me in 2 weeks for updates and we can modify

## 2018-11-21 ENCOUNTER — Telehealth: Payer: Self-pay

## 2018-11-21 ENCOUNTER — Other Ambulatory Visit: Payer: Medicare Other

## 2018-11-21 DIAGNOSIS — R945 Abnormal results of liver function studies: Secondary | ICD-10-CM | POA: Diagnosis not present

## 2018-11-21 NOTE — Telephone Encounter (Signed)
Request Reference Number: [id number]. AJOVY INJ 225/1.5 is approved through 12/12/2019. For further questions, call [phone]. Reference PA number is [id number].

## 2018-11-21 NOTE — Telephone Encounter (Signed)
PA done on cover my meds for Ajovy. Pt has tried and failed:Aimovig,tylenol, zoloft,enalapril, depakote, gabapentin,seroquel, imitrex,maxalt,propranlol.enalapril.

## 2018-11-30 DIAGNOSIS — E1169 Type 2 diabetes mellitus with other specified complication: Secondary | ICD-10-CM | POA: Diagnosis not present

## 2018-11-30 DIAGNOSIS — Z9884 Bariatric surgery status: Secondary | ICD-10-CM | POA: Diagnosis not present

## 2018-11-30 DIAGNOSIS — I10 Essential (primary) hypertension: Secondary | ICD-10-CM | POA: Diagnosis not present

## 2018-11-30 DIAGNOSIS — E039 Hypothyroidism, unspecified: Secondary | ICD-10-CM | POA: Diagnosis not present

## 2018-12-01 ENCOUNTER — Other Ambulatory Visit: Payer: Self-pay | Admitting: Family Medicine

## 2018-12-03 ENCOUNTER — Other Ambulatory Visit: Payer: Self-pay | Admitting: Family Medicine

## 2018-12-03 ENCOUNTER — Ambulatory Visit (INDEPENDENT_AMBULATORY_CARE_PROVIDER_SITE_OTHER): Payer: Medicare Other | Admitting: *Deleted

## 2018-12-03 DIAGNOSIS — E538 Deficiency of other specified B group vitamins: Secondary | ICD-10-CM | POA: Diagnosis not present

## 2018-12-03 NOTE — Progress Notes (Signed)
Pt given Cyanocobalamin inj Tolerated well 

## 2018-12-03 NOTE — Telephone Encounter (Signed)
OV 01/14/19

## 2018-12-11 ENCOUNTER — Encounter: Payer: Medicare Other | Attending: General Surgery | Admitting: Skilled Nursing Facility1

## 2018-12-11 DIAGNOSIS — Z713 Dietary counseling and surveillance: Secondary | ICD-10-CM | POA: Diagnosis not present

## 2018-12-11 DIAGNOSIS — E119 Type 2 diabetes mellitus without complications: Secondary | ICD-10-CM

## 2018-12-11 DIAGNOSIS — Z9884 Bariatric surgery status: Secondary | ICD-10-CM | POA: Insufficient documentation

## 2018-12-11 DIAGNOSIS — Z6821 Body mass index (BMI) 21.0-21.9, adult: Secondary | ICD-10-CM | POA: Insufficient documentation

## 2018-12-11 DIAGNOSIS — R140 Abdominal distension (gaseous): Secondary | ICD-10-CM | POA: Diagnosis not present

## 2018-12-11 DIAGNOSIS — R109 Unspecified abdominal pain: Secondary | ICD-10-CM | POA: Diagnosis not present

## 2018-12-11 NOTE — Patient Instructions (Addendum)
Try a frittata for breakfast:  Preheat oven to 350 degrees  Crack 7 eggs into a bowl Pour half cup of fat free milk into the eggs Add sausage crumbles  Whisk all ingredients together Spray pie dish with non stick spray and pour mixture into pie dish   Cook for 30 minutes: stick toothpick in middle if comes outs clean and the egg does not jiggle it is done  -Do not eat sugar options by themselves: cereal, crackers, cakes, cookies, ice cream etc. For example:  your graham crackers and peanut butter are perfect

## 2018-12-11 NOTE — Progress Notes (Signed)
RYGB  Pt arrives having maintained wight. Pt arrives stating she will be meeting with a [facility] physician January 15th. Pt states her blood sugars have been 55 and 63. Pt states she had coconut cake at 6pm and then checked her blood which was 55: Dietitian educated the pt on reactive hypoglycemia and ideas to balance her blood sugars such as having ham with her small slice of coconut cake pt states she realized she probably should not have had only cake but states that is all she wanted and it is the holidays so she is struggling with her bodies wants verses needs.  Dietitian realized pt was called a serving spoon a tablespoon and also realized a teaspoon is actually an eating spoon.  Pt states she is still having stomach pains daily hurting worse some days than other days not being linked to anything she eats or drinks.  When reading pts recall dietitian helped her to realize she got a glucose of 68 after not eating anything from 3am to 6pm. Pt is very frustrated with having possible reactive hypoglycemia. Pt states she has not had any night time low symptoms due to her routine of checking her blood sugar before bed.  Pts hair seems to be thickening and pt seems to be moving a little easier than in previous visits.   Pt will work with [facility] dietitian but if she needs this dietitian she was advised to call or email.   Logged recall: With a piece of cake 2 hours later glucose 55 4am cereal (cheerio with sugar) 6:30 glucose 63 At 6:30 ate a doughnut then blood sugar 79 Water, coffee with protein powder (8oz: 20g) Stir fry (noodles, chicken, steak, broccoli, corn) around half a cup 12pm blood sugar 102 Water, coffee with protein powder (8oz: 20g) 1:30 5 fish sticks and ketchup 4:30 blood sugar 85 Water, coffee with protein powder (8oz: 20g) 5:00 1 peanut butter graham cracker 7pm blood glucose 90 Fluid intake: water, unsweet tea: 46 ounces a day minimum (54-55 fluid ounces total) Estimated total protein  intake: 60 +  Surgery date: 03/21/2017 Surgery type: RYGB Start weight at [facility]: 312.5 Weight today:127.6  TANITA  BODY COMP RESULTS  06/15/2017 09/18/2017 01/22/2018 09/19/2018 12/11/2018   BMI (kg/m^2) 37.8 31.8 25.5 21.8 21.9   Fat Mass (lbs) 100.2 65.4 44.2 23.8 29.2   Fat Free Mass (lbs) 120.2 119.6 104.4 103.4 98.4   Total Body Water (lbs) 87 85 72.8 71.2 67.8    Medications: taking Carafate and protonix  Supplementation: pro[employer] health capsule with food and calcium and b12 shot every month  CBG monitoring: 2 times a day Average CBG per patient: 60, 65, 95, 86, 101, 76 Last patient reported A1c: 5.2  Using straws: no Drinking while eating: no Having you been chewing well: yes Chewing/swallowing difficulties: no Changes in vision: no Changes to mood/headaches: no Hair loss/C[place]es to skin/Changes to nails: seems to be getting better Any difficulty focusing or concentrating: no Sweating: no Dizziness/Lightheaded: no Palpitations: no  Carbonated beverages: no N/V/D/C/GAS: taking miralax daily, having a bowel movement every other day Abdominal Pain: no Dumping syndrome: possible  Recent physical activity:  Water aerobics 3 days week 60 minutes walking in the house  Progress Towards Goal(s):  In progress.   Nutritional Diagnosis:  [place]rystal-3.3 Overweight/obesity related to past poor dietary habits and physical inactivity as evidenced by patient w/ recent RYGB surgery following dietary guidelines for continued weight loss.    Intervention:  Nutrition counseling. Dietitian educated the  pt on advancing her diet to include ns veggies while having enough room to add more fluid into her day. Goals: -Keep up the great work!! Try a frittata for breakfast: Preheat oven to 350 degrees Crack 7 eggs into a bowl Pour half cup of fat free milk into the eggs Add sausage crumbles  Whisk all ingredients together Spray pie dish with non stick spray and pour mixture into pie dish  Cook  for 30 minutes: stick toothpick in middle if comes outs clean and the egg does not jiggle it is done -Do not eat sugar options by themselves: cereal, crackers, cakes, cookies, ice cream etc. For example:  your graham crackers and peanut butter are perfect  Teaching Method Utilized:  Visual Auditory Hands on  Barriers to learning/adherence to lifestyle change: abdominal pain  Demonstrated degree of understanding via:  Teach Back   Monitoring/Evaluation:  Dietary intake, exercise, and body weight.

## 2018-12-17 DIAGNOSIS — R1013 Epigastric pain: Secondary | ICD-10-CM | POA: Diagnosis not present

## 2018-12-17 DIAGNOSIS — K30 Functional dyspepsia: Secondary | ICD-10-CM | POA: Diagnosis not present

## 2018-12-26 DIAGNOSIS — K912 Postsurgical malabsorption, not elsewhere classified: Secondary | ICD-10-CM | POA: Diagnosis not present

## 2018-12-26 DIAGNOSIS — R627 Adult failure to thrive: Secondary | ICD-10-CM | POA: Diagnosis not present

## 2018-12-26 DIAGNOSIS — Z713 Dietary counseling and surveillance: Secondary | ICD-10-CM | POA: Diagnosis not present

## 2018-12-26 DIAGNOSIS — E162 Hypoglycemia, unspecified: Secondary | ICD-10-CM | POA: Diagnosis not present

## 2018-12-26 DIAGNOSIS — Z6821 Body mass index (BMI) 21.0-21.9, adult: Secondary | ICD-10-CM | POA: Diagnosis not present

## 2018-12-26 DIAGNOSIS — Z9884 Bariatric surgery status: Secondary | ICD-10-CM | POA: Diagnosis not present

## 2018-12-27 ENCOUNTER — Other Ambulatory Visit: Payer: Self-pay | Admitting: *Deleted

## 2018-12-27 NOTE — Progress Notes (Signed)
I called [place] @ [phone] and canceled Aimovig since patient is now on Ajovy.

## 2018-12-31 ENCOUNTER — Other Ambulatory Visit: Payer: Self-pay | Admitting: Family Medicine

## 2018-12-31 NOTE — Telephone Encounter (Signed)
OV 01/14/19

## 2019-01-03 ENCOUNTER — Ambulatory Visit: Payer: Medicare Other

## 2019-01-03 DIAGNOSIS — M47816 Spondylosis without myelopathy or radiculopathy, lumbar region: Secondary | ICD-10-CM | POA: Diagnosis not present

## 2019-01-04 ENCOUNTER — Ambulatory Visit (INDEPENDENT_AMBULATORY_CARE_PROVIDER_SITE_OTHER): Payer: Medicare Other | Admitting: *Deleted

## 2019-01-04 ENCOUNTER — Other Ambulatory Visit: Payer: Self-pay | Admitting: Family Medicine

## 2019-01-04 DIAGNOSIS — E538 Deficiency of other specified B group vitamins: Secondary | ICD-10-CM | POA: Diagnosis not present

## 2019-01-04 MED ORDER — GLUCOSE BLOOD VI STRP: ORAL_STRIP | 3 refills | Status: DC

## 2019-01-04 NOTE — Telephone Encounter (Signed)
Pt aware test strips sent to pharmacy

## 2019-01-04 NOTE — Progress Notes (Signed)
Pt given Cyanocobalamin inj Tolerated well 

## 2019-01-04 NOTE — Telephone Encounter (Signed)
What is the name of the medication? One Touch Ultra test Strips with new direction. Checks twice a day  Have you contacted your pharmacy to request a refill? Yes  Which pharmacy would you like this sent to? [facility]   Patient notified that their request is being sent to the clinical staff for review and that they should receive a call once it is complete. If they do not receive a call within 24 hours they can check with their pharmacy or our office.

## 2019-01-08 ENCOUNTER — Other Ambulatory Visit: Payer: Self-pay | Admitting: *Deleted

## 2019-01-08 DIAGNOSIS — E785 Hyperlipidemia, unspecified: Secondary | ICD-10-CM

## 2019-01-08 MED ORDER — EZETIMIBE 10 MG PO TABS: 10.0000 mg | ORAL_TABLET | Freq: Every day | ORAL | 0 refills | Status: DC

## 2019-01-14 ENCOUNTER — Ambulatory Visit (INDEPENDENT_AMBULATORY_CARE_PROVIDER_SITE_OTHER): Payer: Medicare Other | Admitting: Family Medicine

## 2019-01-14 ENCOUNTER — Encounter: Payer: Self-pay | Admitting: Family Medicine

## 2019-01-14 VITALS — BP 114/70 | HR 57 | Temp 97.7°F | Ht 64.0 in | Wt 130.0 lb

## 2019-01-14 DIAGNOSIS — I10 Essential (primary) hypertension: Secondary | ICD-10-CM

## 2019-01-14 DIAGNOSIS — E039 Hypothyroidism, unspecified: Secondary | ICD-10-CM | POA: Diagnosis not present

## 2019-01-14 DIAGNOSIS — E11649 Type 2 diabetes mellitus with hypoglycemia without coma: Secondary | ICD-10-CM

## 2019-01-14 DIAGNOSIS — E785 Hyperlipidemia, unspecified: Secondary | ICD-10-CM | POA: Diagnosis not present

## 2019-01-14 DIAGNOSIS — E1169 Type 2 diabetes mellitus with other specified complication: Secondary | ICD-10-CM

## 2019-01-14 DIAGNOSIS — E1159 Type 2 diabetes mellitus with other circulatory complications: Secondary | ICD-10-CM | POA: Diagnosis not present

## 2019-01-14 DIAGNOSIS — E559 Vitamin D deficiency, unspecified: Secondary | ICD-10-CM | POA: Diagnosis not present

## 2019-01-14 DIAGNOSIS — E538 Deficiency of other specified B group vitamins: Secondary | ICD-10-CM | POA: Diagnosis not present

## 2019-01-14 MED ORDER — SERTRALINE HCL 100 MG PO TABS: 200.0000 mg | ORAL_TABLET | Freq: Every day | ORAL | 5 refills | Status: DC

## 2019-01-14 MED ORDER — ZIPRASIDONE HCL 60 MG PO CAPS: 60.0000 mg | ORAL_CAPSULE | Freq: Every day | ORAL | 5 refills | Status: DC

## 2019-01-14 NOTE — Progress Notes (Signed)
Subjective: CC: DM, HTN and preop exam PCP: Janora Norlander, DO ECX:[id number] R Morgan [name] is a 53 y.o. female presenting to clinic today for:  1. Type 2 Diabetes w/ hx HTN and HLD  Patient reports that she continues to have low blood sugars into the 40s and 50s.  She saw her bypass specialist at [facility], who has referred her to a different endocrinologist within the [facility] healthcare system.  She has an appointment on March 3 for recheck of her hypoglycemia.  She remains off of medications after gastric bypass surgery for blood sugar and BP control but does continue the Zetia.  She wonders if she needs to continue this medicine.  ROS: Denies LOC, polyuria, polydipsia, foot ulcerations, SOB or chest pain.   2. Hypothyroidism Reports compliance with Synthroid.  Reports that her weight has been stable around 128 at home.  She is denying any symptoms except for chronic abdominal pain which is being managed by GI.  3.  History of vitamin D and vitamin B12 deficiency Patient with history of vitamin D and vitamin B12 deficiency after gastric bypass surgery.  She receives injections of vitamin B12.  She would like to have both these levels rechecked today.  She reports attempting to eat soy protein to keep up muscle mass.  She continues to only be able to eat very small quantities at a time.  She does hydrate well.  ROS: Per HPI  Allergies  Allergen Reactions  . Ketoconazole Hives and Swelling    SWELLING REACTION UNSPECIFIED   . Pravachol [Pravastatin Sodium] Shortness Of Breath, Swelling and Anaphylaxis    Throat swelling  . Statins Other (See Comments)    Leg cramps  . Tape Dermatitis and Other (See Comments)  . Codeine Hypertension    increased BP   Past Medical History:  Diagnosis Date  . Allergic rhinitis   . Anxiety   . Barrett's esophagus   . Bipolar affective disorder ([place])   . CAP (community acquired pneumonia) 07/20/2018  . Chronic respiratory failure ([place])   . Constipation,  chronic   . Degenerative joint disease of spine   . Depression   . Diabetes mellitus without complication ([place])    type 2   . Dry eye   . Family history of adverse reaction to anesthesia    nausea and vomiting  . Gastroparesis   . GERD (gastroesophageal reflux disease)   . History of bariatric surgery 03/2017  . History of hiatal hernia   . Hyperlipidemia   . Hypertension   . Intractable chronic migraine without aura 06/04/2015  . Migraine headache   . Morbid obesity ([place])   . OSA (obstructive sleep apnea)    uses a cpap with 2 units o2   . Osteoarthritis    bilateral knee  . Sleep apnea    has c-pap  . Unspecified hypothyroidism   . Vitamin B 12 deficiency   . Vitamin D deficiency     Current Outpatient Medications:  .  Calcium-Vitamin D-Vitamin K 650-12.5-40 MG-MCG-MCG CHEW, Chew 1 tablet by mouth 2 (two) times daily., Disp: , Rfl:  .  Cyanocobalamin (VITAMIN B-12 IJ), Inject 1,000 mcg as directed every 30 (thirty) days., Disp: , Rfl:  .  divalproex (DEPAKOTE) 500 MG DR tablet, TAKE 1 TABLET EVERY MORNING AND 2 TABLETS AT BEDTIME, Disp: 90 tablet, Rfl: 5 .  ezetimibe (ZETIA) 10 MG tablet, Take 1 tablet (10 mg total) by mouth at bedtime. (Needs to be seen before next  refill), Disp: 30 tablet, Rfl: 0 .  fluticasone (FLONASE) 50 MCG/ACT nasal spray, Place 2 sprays into both nostrils 2 (two) times daily as needed for allergies., Disp: , Rfl:  .  Fremanezumab-vfrm (AJOVY) 225 MG/1.5ML SOSY, Inject 140 mg into the skin every 30 (thirty) days., Disp: 1 Syringe, Rfl: 11 .  gabapentin (NEURONTIN) 300 MG capsule, Take 2 tabs PO in the AM, 1 tab at lunch and 2 tabs at bedtime. (Patient taking differently: Take 300-600 mg by mouth See admin instructions. Take 646m by mouth in the morning, 3024mat lunch and 60035mt bedtime.), Disp: 450 capsule, Rfl: 3 .  glucose blood (ONE TOUCH ULTRA TEST) test strip, CHECK BLOOD SUGAR TWICE A DAY, Disp: 200 each, Rfl: 3 .  levothyroxine (SYNTHROID,  LEVOTHROID) 112 MCG tablet, TAKE 1 TABLET BEFORE BREAKFAST, Disp: 30 tablet, Rfl: 2 .  loratadine (CLARITIN) 10 MG tablet, Take 10 mg by mouth daily as needed for allergies. , Disp: , Rfl:  .  Multiple Vitamin (MULTIVITAMIN) capsule, Take 1 capsule by mouth daily. , Disp: , Rfl:  .  nystatin (NYAMYC) powder, APPLY TOPICALLY TWICE A DAY AS NEEDED (Patient taking differently: Apply 1 g topically 2 (two) times daily as needed (yeast). ), Disp: 30 g, Rfl: 0 .  ondansetron (ZOFRAN) 4 MG tablet, Take 1 tablet (4 mg total) by mouth every 8 (eight) hours., Disp: 90 tablet, Rfl: 11 .  pantoprazole (PROTONIX) 40 MG tablet, Take 1 tablet (40 mg total) by mouth daily., Disp: 90 tablet, Rfl: 1 .  PAZEO 0.7 % SOLN, Place 1 drop into both eyes every morning., Disp: , Rfl:  .  polyethylene glycol (MIRALAX / GLYCOLAX) packet, Take 17 g by mouth at bedtime as needed for mild constipation. , Disp: , Rfl:  .  RESTASIS MULTIDOSE 0.05 % ophthalmic emulsion, Place 1 drop into both eyes 2 (two) times daily., Disp: , Rfl:  .  sertraline (ZOLOFT) 100 MG tablet, TAKE (2) TABLETS DAILY, Disp: 60 tablet, Rfl: 0 .  tiZANidine (ZANAFLEX) 2 MG tablet, Take 1 tablet (2 mg total) by mouth every 8 (eight) hours as needed for muscle spasms., Disp: , Rfl:  .  traMADol (ULTRAM) 50 MG tablet, Take 50 mg by mouth every 6 (six) hours. , Disp: , Rfl:  .  ziprasidone (GEODON) 60 MG capsule, TAKE 1 CAPSULE AT BEDTIME, Disp: 30 capsule, Rfl: 0  Current Facility-Administered Medications:  .  cyanocobalamin ((VITAMIN B-12)) injection 1,000 mcg, 1,000 mcg, Intramuscular, Q30 days, GotRonnie Doss DO, 1,000 mcg at 01/04/19 [id number] History   Socioeconomic History  . Marital status: Single    Spouse name: Not on file  . Number of children: 0  . Years of education: HS  . Highest education level: 12th grade  Occupational History  . Occupation: disabled    EmpFish farm managerNEMPLOYED  Social Needs  . Financial resource strain: Not hard  at all  . Food insecurity:    Worry: Never true    Inability: Never true  . Transportation needs:    Medical: No    Non-medical: No  Tobacco Use  . Smoking status: Never Smoker  . Smokeless tobacco: Never Used  Substance and Sexual Activity  . Alcohol use: No  . Drug use: No  . Sexual activity: Never    Birth control/protection: None  Lifestyle  . Physical activity:    Days per week: 0 days    Minutes per session: 0 min  . Stress: Not at  all  Relationships  . Social connections:    Talks on phone: More than three times a week    Gets together: More than three times a week    Attends religious service: More than 4 times per year    Active member of club or organization: No    Attends meetings of clubs or organizations: Never    Relationship status: Never married  . Intimate partner violence:    Fear of current or ex partner: No    Emotionally abused: No    Physically abused: No    Forced sexual activity: No  Other Topics Concern  . Not on file  Social History Narrative   Patient is right handed.   Patient drinks 2 glasses of caffeine daily.   Lives at home. Her niece is living with her right now.   Family History  Problem Relation Age of Onset  . Asthma Father   . Allergies Father   . Heart disease Father        enlarged heart  . Peripheral vascular disease Father   . Diabetes Father   . Hyperlipidemia Father   . Arthritis Father   . Asthma Sister   . Cancer Sister        colon at 54 yr old.  . Colon cancer Sister   . Allergies Mother   . Stroke Mother 24       with hemi paralysis  . Diabetes Mother   . Hyperlipidemia Mother   . Hypertension Mother   . GI problems Mother   . Arthritis Mother   . Allergies Brother   . Early death Brother 9       congenital abormality  . Allergies Sister   . Diabetes Sister   . Asthma Sister   . Colon polyps Sister   . Hyperlipidemia Sister   . GI problems Sister        gastroporesis   . Liver disease Sister         fatty liver  . Stroke Sister        intercrandial bleed  . Diabetes Brother   . Hypertension Brother   . Hyperlipidemia Brother   . Heart disease Paternal Aunt   . Migraines Neg Hx     Objective: Office vital signs reviewed. BP 114/70   Pulse (!) 57   Temp 97.7 F (36.5 C) (Oral)   Ht '5\' 4"'  (1.626 m)   Wt 130 lb (59 kg)   LMP 10/17/2017 (Approximate)   BMI 22.31 kg/m   Physical Examination:  General: Awake, alert, thin, No acute distress HEENT: Normal, sclera white. Cardio: regular rate and rhythm, S1S2 heard, no murmurs appreciated Pulm: clear to auscultation bilaterally, no wheezes, rhonchi or rales; normal work of breathing on room air Extremities: warm, well perfused, No edema, cyanosis or clubbing; +2 pulses bilaterally  No results found.  Assessment/ Plan: 53 y.o. female   1. Type 2 diabetes mellitus with hypoglycemia without coma, without long-term current use of insulin (HCC) Under excellent control status post gastric bypass.  A1c today was 5.5.  She continues to have intermittent hypoglycemic episodes and is planned to see [facility] soon for this.  Check fasting lipid panel and metabolic panel. - Lipid Panel - CMP14+EGFR - Bayer DCA Hb A1c Waived  2. Hypertension associated with diabetes ([place]) Under excellent control status post [place] bypass - CMP14+EGFR  3. Hyperlipidemia associated with type 2 diabetes mellitus ([place]) May be able to discontinue Zetia.  Check  fasting lipid panel. - Lipid Panel - CMP14+EGFR  4. Acquired hypothyroidism Asymptomatic.  Check thyroid panel - Thyroid Panel With TSH  5. Vitamin B 12 deficiency History of gastric bypass and poor p.o. intake.  Check vitamin B - CBC - Vitamin B12  6. Vitamin D deficiency As above - VITAMIN D 25 Hydroxy (Vit-D Deficiency, Fractures)   Orders Placed This Encounter  Procedures  . Thyroid Panel With TSH  . Lipid Panel  . CMP14+EGFR  . CBC  . Vitamin B12  . VITAMIN D 25  Hydroxy (Vit-D Deficiency, Fractures)  . Bayer DCA Hb A1c Waived    Meds ordered this encounter  Medications  . sertraline (ZOLOFT) 100 MG tablet    Sig: Take 2 tablets (200 mg total) by mouth daily.    Dispense:  60 tablet    Refill:  5  . ziprasidone (GEODON) 60 MG capsule    Sig: Take 1 capsule (60 mg total) by mouth at bedtime.    Dispense:  30 capsule    Refill:  [place], [place] [phone]

## 2019-01-14 NOTE — Patient Instructions (Signed)
You had labs performed today.  You will be contacted with the results of the labs once they are available, usually in the next 3 business days for routine lab work.   

## 2019-01-15 ENCOUNTER — Other Ambulatory Visit: Payer: Self-pay | Admitting: *Deleted

## 2019-01-15 ENCOUNTER — Other Ambulatory Visit: Payer: Self-pay | Admitting: Family Medicine

## 2019-01-15 DIAGNOSIS — D72819 Decreased white blood cell count, unspecified: Secondary | ICD-10-CM

## 2019-01-15 LAB — CMP14+EGFR
A/G RATIO: 2.2 (ref 1.2–2.2)
ALK PHOS: 51 IU/L (ref 39–117)
ALT: 23 IU/L (ref 0–32)
AST: 20 IU/L (ref 0–40)
Albumin: 4.2 g/dL (ref 3.8–4.9)
BILIRUBIN TOTAL: 0.2 mg/dL (ref 0.0–1.2)
BUN/Creatinine Ratio: 31 — ABNORMAL HIGH (ref 9–23)
BUN: 19 mg/dL (ref 6–24)
CHLORIDE: 101 mmol/L (ref 96–106)
CO2: 31 mmol/L — ABNORMAL HIGH (ref 20–29)
Calcium: 8.9 mg/dL (ref 8.7–10.2)
Creatinine, Ser: 0.61 mg/dL (ref 0.57–1.00)
GFR calc Af Amer: 121 mL/min/{1.73_m2} (ref >59)
GFR calc non Af Amer: 105 mL/min/{1.73_m2} (ref >59)
GLOBULIN, TOTAL: 1.9 g/dL (ref 1.5–4.5)
GLUCOSE: 71 mg/dL (ref 65–99)
POTASSIUM: 4.3 mmol/L (ref 3.5–5.2)
SODIUM: 146 mmol/L — AB (ref 134–144)
Total Protein: 6.1 g/dL (ref 6.0–8.5)

## 2019-01-15 LAB — LIPID PANEL
Chol/HDL Ratio: 2.3 ratio (ref 0.0–4.4)
Cholesterol, Total: 155 mg/dL (ref 100–199)
HDL: 66 mg/dL (ref >39)
LDL CALC: 76 mg/dL (ref 0–99)
TRIGLYCERIDES: 63 mg/dL (ref 0–149)
VLDL CHOLESTEROL CAL: 13 mg/dL (ref 5–40)

## 2019-01-15 LAB — THYROID PANEL WITH TSH
Free Thyroxine Index: 1.2 (ref 1.2–4.9)
T3 Uptake Ratio: 22 % — ABNORMAL LOW (ref 24–39)
T4, Total: 5.3 ug/dL (ref 4.5–12.0)
TSH: 1.47 u[IU]/mL (ref 0.450–4.500)

## 2019-01-15 LAB — CBC
Hematocrit: 38.1 % (ref 34.0–46.6)
Hemoglobin: 12.3 g/dL (ref 11.1–15.9)
MCH: 30.2 pg (ref 26.6–33.0)
MCHC: 32.3 g/dL (ref 31.5–35.7)
MCV: 94 fL (ref 79–97)
PLATELETS: 152 10*3/uL (ref 150–450)
RBC: 4.07 x10E6/uL (ref 3.77–5.28)
RDW: 13.1 % (ref 11.7–15.4)
WBC: 3.1 10*3/uL — AB (ref 3.4–10.8)

## 2019-01-15 LAB — VITAMIN D 25 HYDROXY (VIT D DEFICIENCY, FRACTURES): VIT D 25 HYDROXY: 58 ng/mL (ref 30.0–100.0)

## 2019-01-15 LAB — VITAMIN B12: Vitamin B-12: 1805 pg/mL — ABNORMAL HIGH (ref 232–1245)

## 2019-01-15 LAB — BAYER DCA HB A1C WAIVED: HB A1C (BAYER DCA - WAIVED): 5.5 % (ref <7.0)

## 2019-01-16 DIAGNOSIS — M545 Low back pain: Secondary | ICD-10-CM | POA: Diagnosis not present

## 2019-01-16 DIAGNOSIS — M5416 Radiculopathy, lumbar region: Secondary | ICD-10-CM | POA: Diagnosis not present

## 2019-01-17 DIAGNOSIS — R140 Abdominal distension (gaseous): Secondary | ICD-10-CM | POA: Diagnosis not present

## 2019-01-17 DIAGNOSIS — R109 Unspecified abdominal pain: Secondary | ICD-10-CM | POA: Diagnosis not present

## 2019-01-17 DIAGNOSIS — M1710 Unilateral primary osteoarthritis, unspecified knee: Secondary | ICD-10-CM | POA: Diagnosis not present

## 2019-01-17 DIAGNOSIS — Z9884 Bariatric surgery status: Secondary | ICD-10-CM | POA: Diagnosis not present

## 2019-01-18 DIAGNOSIS — M545 Low back pain: Secondary | ICD-10-CM | POA: Diagnosis not present

## 2019-01-18 DIAGNOSIS — M5416 Radiculopathy, lumbar region: Secondary | ICD-10-CM | POA: Diagnosis not present

## 2019-02-04 DIAGNOSIS — M47816 Spondylosis without myelopathy or radiculopathy, lumbar region: Secondary | ICD-10-CM | POA: Diagnosis not present

## 2019-02-04 DIAGNOSIS — R030 Elevated blood-pressure reading, without diagnosis of hypertension: Secondary | ICD-10-CM | POA: Diagnosis not present

## 2019-02-05 ENCOUNTER — Ambulatory Visit (INDEPENDENT_AMBULATORY_CARE_PROVIDER_SITE_OTHER): Payer: Medicare Other | Admitting: *Deleted

## 2019-02-05 DIAGNOSIS — E538 Deficiency of other specified B group vitamins: Secondary | ICD-10-CM

## 2019-02-05 NOTE — Progress Notes (Signed)
Pt given cyanocobalamin inj Tolerated well 

## 2019-02-08 ENCOUNTER — Other Ambulatory Visit: Payer: Self-pay | Admitting: Neurosurgery

## 2019-02-12 DIAGNOSIS — E161 Other hypoglycemia: Secondary | ICD-10-CM | POA: Insufficient documentation

## 2019-02-12 NOTE — Pre-Procedure Instructions (Signed)
CONCHA SUDOL  02/12/2019      [place], [place] [address] Phone: [phone] Fax: [phone]    Your procedure is scheduled on February 19 2019.  Report to [facility] Entrance "A" at 1130 AM.  Call this number if you have problems the morning of surgery:  [phone]   Remember:  Do not eat or drink after midnight.   Take these medicines the morning of surgery with A SIP OF WATER  divalproex (DEPAKOTE)  gabapentin (NEURONTIN) levothyroxine (SYNTHROID, LEVOTHROID)  ondansetron (ZOFRAN pantoprazole (PROTONIX)  Eye Drops sertraline (ZOLOFT)  IF NEEDED: Flonase nasal spray loratadine (CLARITIN)  Tizanidine (zanaflex)- for muscle spasms Tramadol (ultram) -for pain 7 days prior to surgery STOP taking any Aspirin (unless otherwise instructed by your surgeon), Aleve, Naproxen, Ibuprofen, Motrin, Advil, Goody's, BC's, all herbal medications, fish oil, and all vitamins    WHAT DO I DO ABOUT MY DIABETES MEDICATION?   Marland Kitchen The day of surgery, do not take other diabetes injectables, including FREMANEZUMAB-FRM (AJOVY)    Reviewed and Endorsed by [facility] Patient Education Committee, August 2015  How to Manage Your Diabetes Before and After Surgery  Why is it important to control my blood sugar before and after surgery? . Improving blood sugar levels before and after surgery helps healing and can limit problems. . A way of improving blood sugar control is eating a healthy diet by: o  Eating less sugar and carbohydrates o  Increasing activity/exercise o  Talking with your doctor about reaching your blood sugar goals . High blood sugars (greater than 180 mg/dL) can raise your risk of infections and slow your recovery, so you will need to focus on controlling your diabetes during the weeks before surgery. . Make sure that the doctor who takes care of your diabetes knows about your planned surgery including  the date and location.  How do I manage my blood sugar before surgery? . Check your blood sugar at least 4 times a day, starting 2 days before surgery, to make sure that the level is not too high or low. o Check your blood sugar the morning of your surgery when you wake up and every 2 hours until you get to the Short Stay unit. . If your blood sugar is less than 70 mg/dL, you will need to treat for low blood sugar: o Do not take insulin. o Treat a low blood sugar (less than 70 mg/dL) with  cup of clear juice (cranberry or apple), 4 glucose tablets, OR glucose gel. Recheck blood sugar in 15 minutes after treatment (to make sure it is greater than 70 mg/dL). If your blood sugar is not greater than 70 mg/dL on recheck, call [phone] o  for further instructions. . Report your blood sugar to the short stay nurse when you get to Short Stay.  . If you are admitted to the hospital after surgery: o Your blood sugar will be checked by the staff and you will probably be given insulin after surgery (instead of oral diabetes medicines) to make sure you have good blood sugar levels. o The goal for blood sugar control after surgery is 80-180 mg/dL.   [place]- Preparing For Surgery  Before surgery, you can play an important role. Because skin is not sterile, your skin needs to be as free of germs as possible. You can reduce the number of germs on your skin by washing with CHG (  chlorahexidine gluconate) Soap before surgery.  CHG is an antiseptic cleaner which kills germs and bonds with the skin to continue killing germs even after washing.    Oral Hygiene is also important to reduce your risk of infection.  Remember - BRUSH YOUR TEETH THE MORNING OF SURGERY WITH YOUR REGULAR TOOTHPASTE  Please do not use if you have an allergy to CHG or antibacterial soaps. If your skin becomes reddened/irritated stop using the CHG.  Do not shave (including legs and underarms) for at least 48 hours prior to first CHG  shower. It is OK to shave your face.  Please follow these instructions carefully.   1. Shower the NIGHT BEFORE SURGERY and the MORNING OF SURGERY with CHG.   2. If you chose to wash your hair, wash your hair first as usual with your normal shampoo.  3. After you shampoo, rinse your hair and body thoroughly to remove the shampoo.  4. Use CHG as you would any other liquid soap. You can apply CHG directly to the skin and wash gently with a scrungie or a clean washcloth.   5. Apply the CHG Soap to your body ONLY FROM THE NECK DOWN.  Do not use on open wounds or open sores. Avoid contact with your eyes, ears, mouth and genitals (private parts). Wash Face and genitals (private parts)  with your normal soap.  6. Wash thoroughly, paying special attention to the area where your surgery will be performed.  7. Thoroughly rinse your body with warm water from the neck down.  8. DO NOT shower/wash with your normal soap after using and rinsing off the CHG Soap.  9. Pat yourself dry with a CLEAN TOWEL.  10. Wear CLEAN PAJAMAS to bed the night before surgery, wear comfortable clothes the morning of surgery  11. Place CLEAN SHEETS on your bed the night of your first shower and DO NOT SLEEP WITH PETS.    Day of Surgery:  Do not apply any deodorants/lotions.  Please wear clean clothes to the hospital/surgery center.   Remember to brush your teeth WITH YOUR REGULAR TOOTHPASTE.   Do not wear jewelry, make-up or nail polish.  Do not wear lotions, powders, or perfumes, or deodorant.  Do not shave 48 hours prior to surgery.    Do not bring valuables to the hospital.  [facility] is not responsible for any belongings or valuables.  Contacts, dentures or bridgework may not be worn into surgery.  Leave your suitcase in the car.  After surgery it may be brought to your room.  For patients admitted to the hospital, discharge time will be determined by your treatment team.  Patients discharged the day  of surgery will not be allowed to drive home.   Please read over the following fact sheets that you were given.

## 2019-02-13 ENCOUNTER — Encounter (HOSPITAL_COMMUNITY): Payer: Self-pay

## 2019-02-13 ENCOUNTER — Other Ambulatory Visit: Payer: Self-pay

## 2019-02-13 ENCOUNTER — Encounter (HOSPITAL_COMMUNITY)
Admission: RE | Admit: 2019-02-13 | Discharge: 2019-02-13 | Disposition: A | Discharge status: Home / Self Care | Payer: Medicare Other | Source: Ambulatory Visit | Attending: Neurosurgery | Admitting: Neurosurgery

## 2019-02-13 DIAGNOSIS — Z01818 Encounter for other preprocedural examination: Secondary | ICD-10-CM | POA: Insufficient documentation

## 2019-02-13 HISTORY — DX: Anemia, unspecified: D64.9

## 2019-02-13 LAB — CBC
HCT: 39 % (ref 36.0–46.0)
Hemoglobin: 12.1 g/dL (ref 12.0–15.0)
MCH: 29.8 pg (ref 26.0–34.0)
MCHC: 31 g/dL (ref 30.0–36.0)
MCV: 96.1 fL (ref 80.0–100.0)
Platelets: 156 10*3/uL (ref 150–400)
RBC: 4.06 MIL/uL (ref 3.87–5.11)
RDW: 13 % (ref 11.5–15.5)
WBC: 3.4 10*3/uL — ABNORMAL LOW (ref 4.0–10.5)
nRBC: 0 % (ref 0.0–0.2)

## 2019-02-13 LAB — TYPE AND SCREEN
ABO/RH(D): AB POS
Antibody Screen: NEGATIVE

## 2019-02-13 LAB — GLUCOSE, CAPILLARY
GLUCOSE-CAPILLARY: 110 mg/dL — AB (ref 70–99)
GLUCOSE-CAPILLARY: 53 mg/dL — AB (ref 70–99)

## 2019-02-13 LAB — BASIC METABOLIC PANEL
Anion gap: 8 (ref 5–15)
BUN: 20 mg/dL (ref 6–20)
CO2: 30 mmol/L (ref 22–32)
Calcium: 8.7 mg/dL — ABNORMAL LOW (ref 8.9–10.3)
Chloride: 103 mmol/L (ref 98–111)
Creatinine, Ser: 0.67 mg/dL (ref 0.44–1.00)
GFR calc non Af Amer: >60 mL/min (ref >60)
Glucose, Bld: 97 mg/dL (ref 70–99)
Potassium: 3.7 mmol/L (ref 3.5–5.1)
Sodium: 141 mmol/L (ref 135–145)

## 2019-02-13 LAB — ABO/RH: ABO/RH(D): AB POS

## 2019-02-13 LAB — HEMOGLOBIN A1C
Hgb A1c MFr Bld: 5.5 % (ref 4.8–5.6)
Mean Plasma Glucose: 111.15 mg/dL

## 2019-02-13 LAB — SURGICAL PCR SCREEN
MRSA, PCR: NEGATIVE
Staphylococcus aureus: NEGATIVE

## 2019-02-13 NOTE — Progress Notes (Signed)
PCP - Dr. Ronnie Doss  Endocrin- Dr. Armando Reichert  Cardiologist - Denies  Chest x-ray - 09/25/18 (E)  EKG - 07/22/18 (E)  Stress Test - 09/30/16 (E)  ECHO - 07/23/18 (E)  Cardiac Cath - Denies  AICD-na PM-na LOOP-na  Sleep Study - Yes- Positive CPAP - None- Have not used since gastric surgery  LABS- 02/13/2019: CBC, BMP, T/S, PCR  ASA- Denies  HA1C- 02/13/2019 Fasting Blood Sugar - 40-120, Today 53, after food given 110 Checks Blood Sugar ____2_ times a day  Pt sts since having gastric surgery, her bs and bp have been very low, so sahe has been taken off of all of her medicines. Pt advised to follow the diabetic instructions, and to call if needed.  Anesthesia-Yes- abnormal bs- PA Jeneen Rinks made aware.  Pt denies having chest pain, sob, or fever at this time. All instructions explained to the pt, with a verbal understanding of the material. Pt agrees to go over the instructions while at home for a better understanding. The opportunity to ask questions was provided.

## 2019-02-14 NOTE — Anesthesia Preprocedure Evaluation (Addendum)
Anesthesia Evaluation  Patient identified by MRN, date of birth, ID band Patient awake    Reviewed: Allergy & Precautions, NPO status , Patient's Chart, lab work & pertinent test results  History of Anesthesia Complications Negative for: history of anesthetic complications  Airway Mallampati: II  TM Distance: >3 FB Neck ROM: Full    Dental no notable dental hx. (+) Dental Advisory Given   Pulmonary sleep apnea ,    Pulmonary exam normal        Cardiovascular hypertension, Normal cardiovascular exam     Neuro/Psych PSYCHIATRIC DISORDERS Anxiety Depression Bipolar Disorder    GI/Hepatic hiatal hernia, GERD  ,(+) Cirrhosis       , S/P Gastric bypass   Endo/Other  diabetesHypothyroidism   Renal/GU      Musculoskeletal   Abdominal   Peds  Hematology   Anesthesia Other Findings   Reproductive/Obstetrics                                                            Anesthesia Evaluation  Patient identified by MRN, date of birth, ID band Patient awake    Reviewed: Allergy & Precautions, NPO status , Patient's Chart, lab work & pertinent test results  Airway Mallampati: II  TM Distance: >3 FB Neck ROM: Full    Dental no notable dental hx.    Pulmonary neg pulmonary ROS,    Pulmonary exam normal breath sounds clear to auscultation       Cardiovascular hypertension, Normal cardiovascular exam Rhythm:Regular Rate:Normal     Neuro/Psych Bipolar Disorder negative neurological ROS     GI/Hepatic Neg liver ROS, GERD  ,  Endo/Other  diabetesHypothyroidism   Renal/GU negative Renal ROS  negative genitourinary   Musculoskeletal negative musculoskeletal ROS (+)   Abdominal   Peds negative pediatric ROS (+)  Hematology negative hematology ROS (+)   Anesthesia Other Findings   Reproductive/Obstetrics negative OB ROS                              Anesthesia Physical Anesthesia Plan  ASA: III  Anesthesia Plan: General   Post-op Pain Management:    Induction: Intravenous and Rapid sequence  PONV Risk Score and Plan: 3 and Ondansetron, Dexamethasone and Treatment may vary due to age or medical condition  Airway Management Planned: Oral ETT  Additional Equipment:   Intra-op Plan:   Post-operative Plan: Extubation in OR  Informed Consent: I have reviewed the patients History and Physical, chart, labs and discussed the procedure including the risks, benefits and alternatives for the proposed anesthesia with the patient or authorized representative who has indicated his/her understanding and acceptance.     Dental advisory given  Plan Discussed with: CRNA and Surgeon  Anesthesia Plan Comments:         Anesthesia Quick Evaluation  Anesthesia Physical Anesthesia Plan  ASA: III  Anesthesia Plan: General   Post-op Pain Management:    Induction: Intravenous  PONV Risk Score and Plan: 4 or greater and Ondansetron, Dexamethasone, Scopolamine patch - Pre-op and Diphenhydramine  Airway Management Planned: Oral ETT  Additional Equipment:   Intra-op Plan:   Post-operative Plan: Extubation in OR  Informed Consent: I have reviewed the patients History and Physical, chart, labs and discussed the  procedure including the risks, benefits and alternatives for the proposed anesthesia with the patient or authorized representative who has indicated his/her understanding and acceptance.     Dental advisory given  Plan Discussed with: Anesthesiologist and CRNA  Anesthesia Plan Comments:     Anesthesia Quick Evaluation

## 2019-02-22 ENCOUNTER — Telehealth: Payer: Self-pay | Admitting: Internal Medicine

## 2019-02-22 ENCOUNTER — Other Ambulatory Visit: Payer: Self-pay

## 2019-02-22 MED ORDER — FLUTICASONE PROPIONATE 50 MCG/ACT NA SUSP: 2.0000 | Freq: Two times a day (BID) | NASAL | 4 refills | Status: DC | PRN

## 2019-02-22 NOTE — Telephone Encounter (Signed)
Spoke with the pt and notified her that we are waiting for Dr Annamaria Boots to respond to her call  Please advise if you give clearance for her back surgery, thanks

## 2019-02-22 NOTE — Telephone Encounter (Signed)
June 15, 2018  [name], [name], CMA       11:58 AM  Note    Called and spoke with pt letting her know the results of the HST and based on the results, pt does not have OSA.  Pt asked if the CPAP could be picked up and I stated to her I would send an order to [facility] stating for them to pick up her equipment. Also stated to pt she could follow up at our office as needed.  Pt expressed understanding and asked if I could cancel her upcoming appt with CY.  appt has been cancelled and order has been placed to [facility] to pick up pt's cpap. Nothing further needed.     Spoke with patient. She is scheduled for back surgery on 3/24. [facility] is asking for a pulmonary clearance.   CY, please advise. Thanks!

## 2019-02-22 NOTE — Telephone Encounter (Signed)
Pt is calling back [phone]

## 2019-02-25 NOTE — Telephone Encounter (Signed)
Pt calling back f/u from Friday CB# [phone]

## 2019-02-25 NOTE — Telephone Encounter (Signed)
CY please advise if you have the patients surgical clearance form. Thank you. Spoke with patient she is aware we are waiting on response.

## 2019-02-26 NOTE — Telephone Encounter (Signed)
Stanley please advise if this was faxed and confirmed thank you.

## 2019-02-26 NOTE — Telephone Encounter (Signed)
This has been completed and given to Baker to fax

## 2019-02-27 ENCOUNTER — Other Ambulatory Visit: Payer: Self-pay | Admitting: *Deleted

## 2019-02-27 ENCOUNTER — Other Ambulatory Visit: Payer: Self-pay | Admitting: Family Medicine

## 2019-02-27 MED ORDER — FLUTICASONE PROPIONATE 50 MCG/ACT NA SUSP: 2.0000 | Freq: Two times a day (BID) | NASAL | 4 refills | Status: DC | PRN

## 2019-03-01 NOTE — Telephone Encounter (Signed)
Just following up on this. Stanley, please advise if all was taken care of and if this encounter can be closed. Thanks!

## 2019-03-05 NOTE — Telephone Encounter (Signed)
[facility] [phone]. Was told the patient surgery date is Apr 19, 2019. Transferred to [place]  (surgery scheduler) left message requesting call back to confirm the form was received.

## 2019-03-05 NOTE — Telephone Encounter (Signed)
Received confirmation from front desk Theodoro Grist) the fax was sent, it was placed back in Dr. Janee Morn box to let him know that it was done.  Awaiting call back from Dante at [facility] to confirm she got it on her end.

## 2019-03-06 NOTE — Telephone Encounter (Signed)
Nikki from [facility] called in and stated they did receive the surgical clearance for the patient.  Nothing further needed at this time.

## 2019-03-06 NOTE — Telephone Encounter (Signed)
[facility], was placed on a long hold with no one coming to the line. Will try back.

## 2019-03-18 DIAGNOSIS — R140 Abdominal distension (gaseous): Secondary | ICD-10-CM | POA: Diagnosis not present

## 2019-03-18 DIAGNOSIS — R112 Nausea with vomiting, unspecified: Secondary | ICD-10-CM | POA: Diagnosis not present

## 2019-03-18 DIAGNOSIS — R1084 Generalized abdominal pain: Secondary | ICD-10-CM | POA: Diagnosis not present

## 2019-03-19 ENCOUNTER — Encounter (HOSPITAL_COMMUNITY): Payer: Self-pay | Admitting: Emergency Medicine

## 2019-03-19 ENCOUNTER — Emergency Department (HOSPITAL_COMMUNITY): Payer: Medicare Other

## 2019-03-19 ENCOUNTER — Inpatient Hospital Stay (HOSPITAL_COMMUNITY): Payer: Medicare Other | Admitting: Anesthesiology

## 2019-03-19 ENCOUNTER — Encounter (HOSPITAL_COMMUNITY): Admission: EM | Disposition: A | Discharge status: Home / Self Care | Payer: Self-pay | Source: Home / Self Care

## 2019-03-19 ENCOUNTER — Inpatient Hospital Stay (HOSPITAL_COMMUNITY)
Admission: EM | Admit: 2019-03-19 | Discharge: 2019-03-21 | DRG: 336 | Disposition: A | Discharge status: Home / Self Care | Payer: Medicare Other | Attending: Surgery | Admitting: Surgery

## 2019-03-19 ENCOUNTER — Other Ambulatory Visit: Payer: Self-pay

## 2019-03-19 DIAGNOSIS — E1159 Type 2 diabetes mellitus with other circulatory complications: Secondary | ICD-10-CM | POA: Diagnosis present

## 2019-03-19 DIAGNOSIS — R1110 Vomiting, unspecified: Secondary | ICD-10-CM | POA: Diagnosis not present

## 2019-03-19 DIAGNOSIS — Z8379 Family history of other diseases of the digestive system: Secondary | ICD-10-CM

## 2019-03-19 DIAGNOSIS — Z823 Family history of stroke: Secondary | ICD-10-CM

## 2019-03-19 DIAGNOSIS — R740 Nonspecific elevation of levels of transaminase and lactic acid dehydrogenase [LDH]: Secondary | ICD-10-CM | POA: Diagnosis not present

## 2019-03-19 DIAGNOSIS — F319 Bipolar disorder, unspecified: Secondary | ICD-10-CM | POA: Diagnosis present

## 2019-03-19 DIAGNOSIS — Z8719 Personal history of other diseases of the digestive system: Secondary | ICD-10-CM | POA: Diagnosis not present

## 2019-03-19 DIAGNOSIS — J9610 Chronic respiratory failure, unspecified whether with hypoxia or hypercapnia: Secondary | ICD-10-CM | POA: Diagnosis not present

## 2019-03-19 DIAGNOSIS — I152 Hypertension secondary to endocrine disorders: Secondary | ICD-10-CM | POA: Diagnosis not present

## 2019-03-19 DIAGNOSIS — E1169 Type 2 diabetes mellitus with other specified complication: Secondary | ICD-10-CM | POA: Diagnosis not present

## 2019-03-19 DIAGNOSIS — Z9884 Bariatric surgery status: Secondary | ICD-10-CM

## 2019-03-19 DIAGNOSIS — D62 Acute posthemorrhagic anemia: Secondary | ICD-10-CM | POA: Diagnosis not present

## 2019-03-19 DIAGNOSIS — G4733 Obstructive sleep apnea (adult) (pediatric): Secondary | ICD-10-CM | POA: Diagnosis not present

## 2019-03-19 DIAGNOSIS — K2270 Barrett's esophagus without dysplasia: Secondary | ICD-10-CM | POA: Diagnosis not present

## 2019-03-19 DIAGNOSIS — Z8249 Family history of ischemic heart disease and other diseases of the circulatory system: Secondary | ICD-10-CM

## 2019-03-19 DIAGNOSIS — K56609 Unspecified intestinal obstruction, unspecified as to partial versus complete obstruction: Secondary | ICD-10-CM

## 2019-03-19 DIAGNOSIS — Z8371 Family history of colonic polyps: Secondary | ICD-10-CM

## 2019-03-19 DIAGNOSIS — Z931 Gastrostomy status: Secondary | ICD-10-CM | POA: Diagnosis not present

## 2019-03-19 DIAGNOSIS — E1143 Type 2 diabetes mellitus with diabetic autonomic (poly)neuropathy: Secondary | ICD-10-CM | POA: Diagnosis present

## 2019-03-19 DIAGNOSIS — Z7951 Long term (current) use of inhaled steroids: Secondary | ICD-10-CM

## 2019-03-19 DIAGNOSIS — Z9049 Acquired absence of other specified parts of digestive tract: Secondary | ICD-10-CM

## 2019-03-19 DIAGNOSIS — Z833 Family history of diabetes mellitus: Secondary | ICD-10-CM

## 2019-03-19 DIAGNOSIS — K219 Gastro-esophageal reflux disease without esophagitis: Secondary | ICD-10-CM | POA: Diagnosis not present

## 2019-03-19 DIAGNOSIS — E039 Hypothyroidism, unspecified: Secondary | ICD-10-CM | POA: Diagnosis present

## 2019-03-19 DIAGNOSIS — K5651 Intestinal adhesions [bands], with partial obstruction: Secondary | ICD-10-CM | POA: Diagnosis not present

## 2019-03-19 DIAGNOSIS — K5909 Other constipation: Secondary | ICD-10-CM | POA: Diagnosis present

## 2019-03-19 DIAGNOSIS — I898 Other specified noninfective disorders of lymphatic vessels and lymph nodes: Secondary | ICD-10-CM | POA: Diagnosis present

## 2019-03-19 DIAGNOSIS — Z79899 Other long term (current) drug therapy: Secondary | ICD-10-CM

## 2019-03-19 DIAGNOSIS — Z91048 Other nonmedicinal substance allergy status: Secondary | ICD-10-CM

## 2019-03-19 DIAGNOSIS — Z79890 Hormone replacement therapy (postmenopausal): Secondary | ICD-10-CM

## 2019-03-19 DIAGNOSIS — M170 Bilateral primary osteoarthritis of knee: Secondary | ICD-10-CM | POA: Diagnosis present

## 2019-03-19 DIAGNOSIS — Z888 Allergy status to other drugs, medicaments and biological substances status: Secondary | ICD-10-CM

## 2019-03-19 DIAGNOSIS — R112 Nausea with vomiting, unspecified: Secondary | ICD-10-CM

## 2019-03-19 DIAGNOSIS — R1084 Generalized abdominal pain: Secondary | ICD-10-CM

## 2019-03-19 DIAGNOSIS — Z8349 Family history of other endocrine, nutritional and metabolic diseases: Secondary | ICD-10-CM

## 2019-03-19 DIAGNOSIS — K3184 Gastroparesis: Secondary | ICD-10-CM | POA: Diagnosis present

## 2019-03-19 DIAGNOSIS — E785 Hyperlipidemia, unspecified: Secondary | ICD-10-CM | POA: Diagnosis present

## 2019-03-19 DIAGNOSIS — G43719 Chronic migraine without aura, intractable, without status migrainosus: Secondary | ICD-10-CM | POA: Diagnosis present

## 2019-03-19 DIAGNOSIS — Z800 Family history of malignant neoplasm of digestive organs: Secondary | ICD-10-CM

## 2019-03-19 DIAGNOSIS — K760 Fatty (change of) liver, not elsewhere classified: Secondary | ICD-10-CM | POA: Diagnosis present

## 2019-03-19 DIAGNOSIS — Z79891 Long term (current) use of opiate analgesic: Secondary | ICD-10-CM

## 2019-03-19 DIAGNOSIS — Z825 Family history of asthma and other chronic lower respiratory diseases: Secondary | ICD-10-CM

## 2019-03-19 DIAGNOSIS — Z885 Allergy status to narcotic agent status: Secondary | ICD-10-CM

## 2019-03-19 HISTORY — PX: LAPAROSCOPIC LYSIS OF ADHESIONS: SHX5905

## 2019-03-19 HISTORY — DX: Unspecified intestinal obstruction, unspecified as to partial versus complete obstruction: K56.609

## 2019-03-19 HISTORY — PX: LAPAROSCOPY: SHX197

## 2019-03-19 LAB — CBC WITH DIFFERENTIAL/PLATELET
Abs Immature Granulocytes: 0.01 10*3/uL (ref 0.00–0.07)
Basophils Absolute: 0 10*3/uL (ref 0.0–0.1)
Basophils Relative: 0 %
Eosinophils Absolute: 0 10*3/uL (ref 0.0–0.5)
Eosinophils Relative: 0 %
HCT: 43.5 % (ref 36.0–46.0)
Hemoglobin: 13.5 g/dL (ref 12.0–15.0)
Immature Granulocytes: 0 %
Lymphocytes Relative: 11 %
Lymphs Abs: 0.7 10*3/uL (ref 0.7–4.0)
MCH: 30 pg (ref 26.0–34.0)
MCHC: 31 g/dL (ref 30.0–36.0)
MCV: 96.7 fL (ref 80.0–100.0)
Monocytes Absolute: 0.2 10*3/uL (ref 0.1–1.0)
Monocytes Relative: 3 %
Neutro Abs: 5.6 10*3/uL (ref 1.7–7.7)
Neutrophils Relative %: 86 %
Platelets: 168 10*3/uL (ref 150–400)
RBC: 4.5 MIL/uL (ref 3.87–5.11)
RDW: 13 % (ref 11.5–15.5)
WBC: 6.5 10*3/uL (ref 4.0–10.5)
nRBC: 0 % (ref 0.0–0.2)

## 2019-03-19 LAB — COMPREHENSIVE METABOLIC PANEL
ALT: 22 U/L (ref 0–44)
AST: 25 U/L (ref 15–41)
Albumin: 4.1 g/dL (ref 3.5–5.0)
Alkaline Phosphatase: 44 U/L (ref 38–126)
Anion gap: 9 (ref 5–15)
BUN: 27 mg/dL — ABNORMAL HIGH (ref 6–20)
CO2: 31 mmol/L (ref 22–32)
Calcium: 8.9 mg/dL (ref 8.9–10.3)
Chloride: 101 mmol/L (ref 98–111)
Creatinine, Ser: 0.51 mg/dL (ref 0.44–1.00)
GFR calc Af Amer: >60 mL/min (ref >60)
GFR calc non Af Amer: >60 mL/min (ref >60)
Glucose, Bld: 165 mg/dL — ABNORMAL HIGH (ref 70–99)
Potassium: 4.3 mmol/L (ref 3.5–5.1)
Sodium: 141 mmol/L (ref 135–145)
Total Bilirubin: 0.5 mg/dL (ref 0.3–1.2)
Total Protein: 7.4 g/dL (ref 6.5–8.1)

## 2019-03-19 LAB — URINALYSIS, ROUTINE W REFLEX MICROSCOPIC
Bilirubin Urine: NEGATIVE
Glucose, UA: 50 mg/dL — AB
Hgb urine dipstick: NEGATIVE
Ketones, ur: 5 mg/dL — AB
Leukocytes,Ua: NEGATIVE
Nitrite: NEGATIVE
Protein, ur: NEGATIVE mg/dL
Specific Gravity, Urine: 1.029 (ref 1.005–1.030)
pH: 5 (ref 5.0–8.0)

## 2019-03-19 LAB — HIV ANTIBODY (ROUTINE TESTING W REFLEX): HIV Screen 4th Generation wRfx: NONREACTIVE

## 2019-03-19 LAB — GLUCOSE, CAPILLARY
Glucose-Capillary: 145 mg/dL — ABNORMAL HIGH (ref 70–99)
Glucose-Capillary: 179 mg/dL — ABNORMAL HIGH (ref 70–99)
Glucose-Capillary: 176 mg/dL — ABNORMAL HIGH (ref 70–99)
Glucose-Capillary: 181 mg/dL — ABNORMAL HIGH (ref 70–99)
Glucose-Capillary: 148 mg/dL — ABNORMAL HIGH (ref 70–99)

## 2019-03-19 LAB — BASIC METABOLIC PANEL
Anion gap: 10 (ref 5–15)
BUN: 21 mg/dL — ABNORMAL HIGH (ref 6–20)
CO2: 25 mmol/L (ref 22–32)
Calcium: 8.2 mg/dL — ABNORMAL LOW (ref 8.9–10.3)
Chloride: 106 mmol/L (ref 98–111)
Creatinine, Ser: 0.45 mg/dL (ref 0.44–1.00)
GFR calc Af Amer: >60 mL/min (ref >60)
GFR calc non Af Amer: >60 mL/min (ref >60)
Glucose, Bld: 169 mg/dL — ABNORMAL HIGH (ref 70–99)
Potassium: 4 mmol/L (ref 3.5–5.1)
Sodium: 141 mmol/L (ref 135–145)

## 2019-03-19 LAB — SURGICAL PCR SCREEN
MRSA, PCR: NEGATIVE
Staphylococcus aureus: NEGATIVE

## 2019-03-19 LAB — CBC
HCT: 39.6 % (ref 36.0–46.0)
Hemoglobin: 12.9 g/dL (ref 12.0–15.0)
MCH: 31.3 pg (ref 26.0–34.0)
MCHC: 32.6 g/dL (ref 30.0–36.0)
MCV: 96.1 fL (ref 80.0–100.0)
Platelets: 145 10*3/uL — ABNORMAL LOW (ref 150–400)
RBC: 4.12 MIL/uL (ref 3.87–5.11)
RDW: 13.3 % (ref 11.5–15.5)
WBC: 4.8 10*3/uL (ref 4.0–10.5)
nRBC: 0 % (ref 0.0–0.2)

## 2019-03-19 LAB — LIPASE, BLOOD: Lipase: 42 U/L (ref 11–51)

## 2019-03-19 LAB — LACTIC ACID, PLASMA
Lactic Acid, Venous: 2.7 mmol/L (ref 0.5–1.9)
Lactic Acid, Venous: 0.9 mmol/L (ref 0.5–1.9)

## 2019-03-19 SURGERY — LAPAROSCOPY, DIAGNOSTIC
Anesthesia: General | Site: Abdomen

## 2019-03-19 MED ORDER — DEXAMETHASONE SODIUM PHOSPHATE 10 MG/ML IJ SOLN
INTRAMUSCULAR | Status: AC
  Filled 2019-03-19: qty 1

## 2019-03-19 MED ORDER — ONDANSETRON 4 MG PO TBDP: 4.0000 mg | ORAL_TABLET | Freq: Four times a day (QID) | ORAL | Status: DC | PRN

## 2019-03-19 MED ORDER — BUPIVACAINE-EPINEPHRINE 0.25% -1:200000 IJ SOLN
INTRAMUSCULAR | Status: DC | PRN
  Administered 2019-03-19: 30 mL

## 2019-03-19 MED ORDER — LACTATED RINGERS IV SOLN
INTRAVENOUS | Status: DC | PRN
  Administered 2019-03-19: 12:00:00 via INTRAVENOUS

## 2019-03-19 MED ORDER — HEPARIN SODIUM (PORCINE) 5000 UNIT/ML IJ SOLN
5000.0000 [IU] | Freq: Three times a day (TID) | INTRAMUSCULAR | Status: DC
  Administered 2019-03-19 – 2019-03-21 (×5): 5000 [IU] via SUBCUTANEOUS
  Filled 2019-03-19 (×5): qty 1

## 2019-03-19 MED ORDER — BISACODYL 10 MG RE SUPP: 10.0000 mg | Freq: Every day | RECTAL | Status: DC | PRN

## 2019-03-19 MED ORDER — LACTATED RINGERS IV SOLN
INTRAVENOUS | Status: DC
  Administered 2019-03-19: 10:00:00 via INTRAVENOUS

## 2019-03-19 MED ORDER — SODIUM CHLORIDE (PF) 0.9 % IJ SOLN
INTRAMUSCULAR | Status: AC
  Filled 2019-03-19: qty 50

## 2019-03-19 MED ORDER — DIVALPROEX SODIUM 250 MG PO DR TAB
500.0000 mg | DELAYED_RELEASE_TABLET | Freq: Every day | ORAL | Status: DC
  Administered 2019-03-20: 500 mg via ORAL
  Filled 2019-03-19: qty 2

## 2019-03-19 MED ORDER — ONDANSETRON 4 MG PO TBDP
4.0000 mg | ORAL_TABLET | Freq: Four times a day (QID) | ORAL | Status: DC | PRN
  Administered 2019-03-19: 4 mg via ORAL
  Filled 2019-03-19: qty 1

## 2019-03-19 MED ORDER — FENTANYL CITRATE (PF) 100 MCG/2ML IJ SOLN
INTRAMUSCULAR | Status: AC
  Filled 2019-03-19: qty 2

## 2019-03-19 MED ORDER — MIDAZOLAM HCL 2 MG/2ML IJ SOLN
INTRAMUSCULAR | Status: AC
  Filled 2019-03-19: qty 2

## 2019-03-19 MED ORDER — ONDANSETRON HCL 4 MG/2ML IJ SOLN
4.0000 mg | Freq: Once | INTRAMUSCULAR | Status: AC
  Administered 2019-03-19: 4 mg via INTRAVENOUS
  Filled 2019-03-19: qty 2

## 2019-03-19 MED ORDER — SERTRALINE HCL 100 MG PO TABS
200.0000 mg | ORAL_TABLET | Freq: Every day | ORAL | Status: DC
  Administered 2019-03-19 – 2019-03-20 (×2): 200 mg via ORAL
  Filled 2019-03-19 (×2): qty 2

## 2019-03-19 MED ORDER — ACETAMINOPHEN 500 MG PO TABS
1000.0000 mg | ORAL_TABLET | Freq: Four times a day (QID) | ORAL | Status: DC
  Administered 2019-03-19 – 2019-03-20 (×3): 1000 mg via ORAL
  Filled 2019-03-19 (×3): qty 2

## 2019-03-19 MED ORDER — HYDROMORPHONE HCL 1 MG/ML IJ SOLN
INTRAMUSCULAR | Status: AC
  Filled 2019-03-19: qty 1

## 2019-03-19 MED ORDER — ONDANSETRON HCL 4 MG/2ML IJ SOLN
INTRAMUSCULAR | Status: AC
  Filled 2019-03-19: qty 2

## 2019-03-19 MED ORDER — SCOPOLAMINE 1 MG/3DAYS TD PT72
1.0000 | MEDICATED_PATCH | TRANSDERMAL | Status: DC
  Administered 2019-03-19: 1.5 mg via TRANSDERMAL

## 2019-03-19 MED ORDER — PROMETHAZINE HCL 25 MG/ML IJ SOLN
12.5000 mg | Freq: Four times a day (QID) | INTRAMUSCULAR | Status: DC | PRN
  Administered 2019-03-19: 12.5 mg via INTRAVENOUS
  Filled 2019-03-19: qty 1

## 2019-03-19 MED ORDER — SUGAMMADEX SODIUM 200 MG/2ML IV SOLN
INTRAVENOUS | Status: DC | PRN
  Administered 2019-03-19: 150 mg via INTRAVENOUS

## 2019-03-19 MED ORDER — DIPHENHYDRAMINE HCL 25 MG PO CAPS: 25.0000 mg | ORAL_CAPSULE | Freq: Four times a day (QID) | ORAL | Status: DC | PRN

## 2019-03-19 MED ORDER — ONDANSETRON HCL 4 MG/2ML IJ SOLN: 4.0000 mg | Freq: Four times a day (QID) | INTRAMUSCULAR | Status: DC | PRN

## 2019-03-19 MED ORDER — MORPHINE SULFATE (PF) 4 MG/ML IV SOLN
4.0000 mg | Freq: Once | INTRAVENOUS | Status: AC
  Administered 2019-03-19: 4 mg via INTRAVENOUS
  Filled 2019-03-19: qty 1

## 2019-03-19 MED ORDER — SODIUM CHLORIDE 0.9 % IV SOLN
INTRAVENOUS | Status: DC | PRN
  Administered 2019-03-19: 80 ug/min via INTRAVENOUS

## 2019-03-19 MED ORDER — LIDOCAINE 2% (20 MG/ML) 5 ML SYRINGE
INTRAMUSCULAR | Status: DC | PRN
  Administered 2019-03-19: 50 mg via INTRAVENOUS

## 2019-03-19 MED ORDER — LEVOTHYROXINE SODIUM 112 MCG PO TABS
112.0000 ug | ORAL_TABLET | Freq: Every day | ORAL | Status: DC
  Administered 2019-03-20 – 2019-03-21 (×2): 112 ug via ORAL
  Filled 2019-03-19 (×2): qty 1

## 2019-03-19 MED ORDER — DEXAMETHASONE SODIUM PHOSPHATE 10 MG/ML IJ SOLN
INTRAMUSCULAR | Status: DC | PRN
  Administered 2019-03-19: 10 mg via INTRAVENOUS

## 2019-03-19 MED ORDER — FENTANYL CITRATE (PF) 100 MCG/2ML IJ SOLN
25.0000 ug | INTRAMUSCULAR | Status: DC | PRN
  Administered 2019-03-19: 50 ug via INTRAVENOUS
  Filled 2019-03-19: qty 2

## 2019-03-19 MED ORDER — BUPIVACAINE-EPINEPHRINE (PF) 0.25% -1:200000 IJ SOLN
INTRAMUSCULAR | Status: AC
  Filled 2019-03-19: qty 30

## 2019-03-19 MED ORDER — ONDANSETRON HCL 4 MG/2ML IJ SOLN
INTRAMUSCULAR | Status: DC | PRN
  Administered 2019-03-19: 4 mg via INTRAVENOUS

## 2019-03-19 MED ORDER — SUCCINYLCHOLINE CHLORIDE 200 MG/10ML IV SOSY
PREFILLED_SYRINGE | INTRAVENOUS | Status: DC | PRN
  Administered 2019-03-19: 100 mg via INTRAVENOUS

## 2019-03-19 MED ORDER — HYDROMORPHONE HCL 1 MG/ML IJ SOLN
0.2500 mg | INTRAMUSCULAR | Status: DC | PRN
  Administered 2019-03-19 (×4): 0.5 mg via INTRAVENOUS

## 2019-03-19 MED ORDER — SODIUM CHLORIDE 0.9 % IV BOLUS
1000.0000 mL | Freq: Once | INTRAVENOUS | Status: AC
  Administered 2019-03-19: 1000 mL via INTRAVENOUS

## 2019-03-19 MED ORDER — ROCURONIUM BROMIDE 10 MG/ML (PF) SYRINGE
PREFILLED_SYRINGE | INTRAVENOUS | Status: AC
  Filled 2019-03-19: qty 10

## 2019-03-19 MED ORDER — PANTOPRAZOLE SODIUM 40 MG IV SOLR
40.0000 mg | Freq: Every day | INTRAVENOUS | Status: DC
  Administered 2019-03-19 – 2019-03-20 (×2): 40 mg via INTRAVENOUS
  Filled 2019-03-19 (×2): qty 40

## 2019-03-19 MED ORDER — GABAPENTIN 300 MG PO CAPS
600.0000 mg | ORAL_CAPSULE | Freq: Two times a day (BID) | ORAL | Status: DC
  Administered 2019-03-19 – 2019-03-20 (×3): 600 mg via ORAL
  Filled 2019-03-19 (×3): qty 2

## 2019-03-19 MED ORDER — HYDROMORPHONE HCL 1 MG/ML IJ SOLN: 0.5000 mg | INTRAMUSCULAR | Status: DC | PRN

## 2019-03-19 MED ORDER — DIPHENHYDRAMINE HCL 50 MG/ML IJ SOLN: 25.0000 mg | Freq: Four times a day (QID) | INTRAMUSCULAR | Status: DC | PRN

## 2019-03-19 MED ORDER — LIDOCAINE 2% (20 MG/ML) 5 ML SYRINGE
INTRAMUSCULAR | Status: AC
  Filled 2019-03-19: qty 5

## 2019-03-19 MED ORDER — MUPIROCIN 2 % EX OINT: 1.0000 "application " | TOPICAL_OINTMENT | Freq: Two times a day (BID) | CUTANEOUS | Status: DC

## 2019-03-19 MED ORDER — GABAPENTIN 300 MG PO CAPS
300.0000 mg | ORAL_CAPSULE | Freq: Every day | ORAL | Status: DC
  Administered 2019-03-20: 300 mg via ORAL
  Filled 2019-03-19: qty 1

## 2019-03-19 MED ORDER — LACTATED RINGERS IV SOLN: INTRAVENOUS | Status: DC

## 2019-03-19 MED ORDER — OXYCODONE HCL 5 MG PO TABS
5.0000 mg | ORAL_TABLET | ORAL | Status: DC | PRN
  Administered 2019-03-19 – 2019-03-21 (×6): 5 mg via ORAL
  Filled 2019-03-19 (×6): qty 1

## 2019-03-19 MED ORDER — TIZANIDINE HCL 4 MG PO TABS: 2.0000 mg | ORAL_TABLET | Freq: Three times a day (TID) | ORAL | Status: DC | PRN

## 2019-03-19 MED ORDER — PROPOFOL 10 MG/ML IV BOLUS
INTRAVENOUS | Status: AC
  Filled 2019-03-19: qty 40

## 2019-03-19 MED ORDER — ONDANSETRON HCL 4 MG/2ML IJ SOLN
4.0000 mg | Freq: Once | INTRAMUSCULAR | Status: AC
  Administered 2019-03-19: 4 mg via INTRAVENOUS

## 2019-03-19 MED ORDER — SODIUM CHLORIDE 0.9 % IV SOLN
INTRAVENOUS | Status: DC
  Administered 2019-03-19 – 2019-03-20 (×2): via INTRAVENOUS

## 2019-03-19 MED ORDER — MIDAZOLAM HCL 2 MG/2ML IJ SOLN
INTRAMUSCULAR | Status: DC | PRN
  Administered 2019-03-19 (×2): 1 mg via INTRAVENOUS

## 2019-03-19 MED ORDER — ZIPRASIDONE HCL 60 MG PO CAPS
60.0000 mg | ORAL_CAPSULE | Freq: Every day | ORAL | Status: DC
  Administered 2019-03-19 – 2019-03-20 (×2): 60 mg via ORAL
  Filled 2019-03-19 (×2): qty 1

## 2019-03-19 MED ORDER — FENTANYL CITRATE (PF) 250 MCG/5ML IJ SOLN
INTRAMUSCULAR | Status: DC | PRN
  Administered 2019-03-19 (×2): 50 ug via INTRAVENOUS

## 2019-03-19 MED ORDER — ONDANSETRON HCL 4 MG/2ML IJ SOLN
4.0000 mg | Freq: Four times a day (QID) | INTRAMUSCULAR | Status: DC | PRN
  Filled 2019-03-19: qty 2

## 2019-03-19 MED ORDER — PROMETHAZINE HCL 25 MG/ML IJ SOLN: 6.2500 mg | INTRAMUSCULAR | Status: DC | PRN

## 2019-03-19 MED ORDER — SUGAMMADEX SODIUM 200 MG/2ML IV SOLN
INTRAVENOUS | Status: AC
  Filled 2019-03-19: qty 2

## 2019-03-19 MED ORDER — POTASSIUM CHLORIDE IN NACL 20-0.9 MEQ/L-% IV SOLN
INTRAVENOUS | Status: DC
  Administered 2019-03-19: 06:00:00 via INTRAVENOUS
  Filled 2019-03-19: qty 1000

## 2019-03-19 MED ORDER — CEFAZOLIN SODIUM-DEXTROSE 2-4 GM/100ML-% IV SOLN
2.0000 g | INTRAVENOUS | Status: AC
  Administered 2019-03-19: 2 g via INTRAVENOUS
  Filled 2019-03-19: qty 100

## 2019-03-19 MED ORDER — DIVALPROEX SODIUM 250 MG PO DR TAB: 500.0000 mg | DELAYED_RELEASE_TABLET | Freq: Every day | ORAL | Status: DC

## 2019-03-19 MED ORDER — ROCURONIUM BROMIDE 10 MG/ML (PF) SYRINGE
PREFILLED_SYRINGE | INTRAVENOUS | Status: DC | PRN
  Administered 2019-03-19: 10 mg via INTRAVENOUS
  Administered 2019-03-19: 40 mg via INTRAVENOUS

## 2019-03-19 MED ORDER — DOCUSATE SODIUM 100 MG PO CAPS
100.0000 mg | ORAL_CAPSULE | Freq: Two times a day (BID) | ORAL | Status: DC
  Administered 2019-03-19 – 2019-03-20 (×3): 100 mg via ORAL
  Filled 2019-03-19 (×3): qty 1

## 2019-03-19 MED ORDER — PROPOFOL 10 MG/ML IV BOLUS
INTRAVENOUS | Status: DC | PRN
  Administered 2019-03-19: 110 mg via INTRAVENOUS

## 2019-03-19 MED ORDER — DIVALPROEX SODIUM 250 MG PO DR TAB
1000.0000 mg | DELAYED_RELEASE_TABLET | Freq: Every day | ORAL | Status: DC
  Administered 2019-03-19 – 2019-03-20 (×2): 1000 mg via ORAL
  Filled 2019-03-19 (×2): qty 4

## 2019-03-19 MED ORDER — PANTOPRAZOLE SODIUM 40 MG IV SOLR: 40.0000 mg | Freq: Every day | INTRAVENOUS | Status: DC

## 2019-03-19 MED ORDER — IOHEXOL 300 MG/ML  SOLN
100.0000 mL | Freq: Once | INTRAMUSCULAR | Status: AC | PRN
  Administered 2019-03-19: 100 mL via INTRAVENOUS

## 2019-03-19 SURGICAL SUPPLY — 68 items
ADH SKN CLS APL DERMABOND .7 (GAUZE/BANDAGES/DRESSINGS) ×1
APPLIER CLIP 5 13 M/L LIGAMAX5 (MISCELLANEOUS)
APPLIER CLIP ROT 10 11.4 M/L (STAPLE)
APR CLP MED LRG 11.4X10 (STAPLE)
APR CLP MED LRG 5 ANG JAW (MISCELLANEOUS)
BLADE EXTENDED COATED 6.5IN (ELECTRODE) IMPLANT
CABLE HIGH FREQUENCY MONO STRZ (ELECTRODE) ×2 IMPLANT
CELLS DAT CNTRL 66122 CELL SVR (MISCELLANEOUS) IMPLANT
CLIP APPLIE 5 13 M/L LIGAMAX5 (MISCELLANEOUS) IMPLANT
CLIP APPLIE ROT 10 11.4 M/L (STAPLE) IMPLANT
COVER SURGICAL LIGHT HANDLE (MISCELLANEOUS) ×4 IMPLANT
COVER WAND RF STERILE (DRAPES) IMPLANT
DECANTER SPIKE VIAL GLASS SM (MISCELLANEOUS) ×2 IMPLANT
DERMABOND ADVANCED (GAUZE/BANDAGES/DRESSINGS) ×1
DERMABOND ADVANCED .7 DNX12 (GAUZE/BANDAGES/DRESSINGS) IMPLANT
DRAIN CHANNEL 19F RND (DRAIN) IMPLANT
DRSG OPSITE POSTOP 4X10 (GAUZE/BANDAGES/DRESSINGS) IMPLANT
DRSG OPSITE POSTOP 4X6 (GAUZE/BANDAGES/DRESSINGS) IMPLANT
DRSG OPSITE POSTOP 4X8 (GAUZE/BANDAGES/DRESSINGS) IMPLANT
ELECT PENCIL ROCKER SW 15FT (MISCELLANEOUS) ×2 IMPLANT
ELECT REM PT RETURN 15FT ADLT (MISCELLANEOUS) ×2 IMPLANT
ENDOLOOP SUT PDS II  0 18 (SUTURE)
ENDOLOOP SUT PDS II 0 18 (SUTURE) IMPLANT
GAUZE SPONGE 4X4 12PLY STRL (GAUZE/BANDAGES/DRESSINGS) IMPLANT
GLOVE BIO SURGEON STRL SZ 6 (GLOVE) ×4 IMPLANT
GLOVE INDICATOR 6.5 STRL GRN (GLOVE) ×4 IMPLANT
GOWN STRL REUS W/TWL LRG LVL3 (GOWN DISPOSABLE) ×4 IMPLANT
GOWN STRL REUS W/TWL XL LVL3 (GOWN DISPOSABLE) ×4 IMPLANT
KIT TURNOVER KIT A (KITS) IMPLANT
NDL INSUFFLATION 14GA 120MM (NEEDLE) ×1 IMPLANT
NEEDLE INSUFFLATION 14GA 120MM (NEEDLE) ×2 IMPLANT
PACK COLON (CUSTOM PROCEDURE TRAY) ×2 IMPLANT
PAD POSITIONING PINK XL (MISCELLANEOUS) IMPLANT
PORT LAP GEL ALEXIS MED 5-9CM (MISCELLANEOUS) IMPLANT
RELOAD STAPLE 60 2.6 WHT THN (STAPLE) IMPLANT
RELOAD STAPLER WHITE 60MM (STAPLE) IMPLANT
RETRACTOR WND ALEXIS 18 MED (MISCELLANEOUS) IMPLANT
RTRCTR WOUND ALEXIS 18CM MED (MISCELLANEOUS)
SCISSORS LAP 5X35 DISP (ENDOMECHANICALS) ×2 IMPLANT
SET IRRIG TUBING LAPAROSCOPIC (IRRIGATION / IRRIGATOR) ×2 IMPLANT
SET TUBE SMOKE EVAC HIGH FLOW (TUBING) ×2 IMPLANT
SHEARS HARMONIC ACE PLUS 36CM (ENDOMECHANICALS) ×2 IMPLANT
SLEEVE XCEL OPT CAN 5 100 (ENDOMECHANICALS) ×4 IMPLANT
SPONGE LAP 18X18 RF (DISPOSABLE) IMPLANT
STAPLER ECHELON LONG 60 440 (INSTRUMENTS) IMPLANT
STAPLER RELOAD WHITE 60MM (STAPLE)
STAPLER VISISTAT 35W (STAPLE) IMPLANT
SUT PDS AB 0 CT1 36 (SUTURE) IMPLANT
SUT PDS AB 1 CTX 36 (SUTURE) IMPLANT
SUT PROLENE 2 0 KS (SUTURE) IMPLANT
SUT PROLENE 2 0 SH DA (SUTURE) IMPLANT
SUT SILK 2 0 (SUTURE) ×2
SUT SILK 2 0 SH CR/8 (SUTURE) ×2 IMPLANT
SUT SILK 2-0 18XBRD TIE 12 (SUTURE) ×1 IMPLANT
SUT SILK 3 0 (SUTURE) ×2
SUT SILK 3 0 SH CR/8 (SUTURE) ×2 IMPLANT
SUT SILK 3-0 18XBRD TIE 12 (SUTURE) ×1 IMPLANT
SUT VIC AB 2-0 SH 27 (SUTURE)
SUT VIC AB 2-0 SH 27X BRD (SUTURE) IMPLANT
SUT VIC AB 3-0 SH 27 (SUTURE)
SUT VIC AB 3-0 SH 27XBRD (SUTURE) IMPLANT
SYS LAPSCP GELPORT 120MM (MISCELLANEOUS)
SYSTEM LAPSCP GELPORT 120MM (MISCELLANEOUS) IMPLANT
TOWEL OR NON WOVEN STRL DISP B (DISPOSABLE) ×2 IMPLANT
TRAY FOLEY MTR SLVR 16FR STAT (SET/KITS/TRAYS/PACK) IMPLANT
TROCAR BLADELESS OPT 5 100 (ENDOMECHANICALS) ×2 IMPLANT
TROCAR XCEL 12X100 BLDLESS (ENDOMECHANICALS) IMPLANT
TUBING CONNECTING 10 (TUBING) ×4 IMPLANT

## 2019-03-19 NOTE — ED Notes (Signed)
ED TO INPATIENT HANDOFF REPORT  Name/Age/Gender Morgan [name] 53 y.o. female  Code Status Code Status History    Date Active Date Inactive Code Status Order ID Comments User Context   10/11/2018 1420 10/11/2018 2129 Full Code [id number]  Elpidio Eric Inpatient   07/20/2018 1419 07/24/2018 1338 Full Code [id number]  Chesley Mires, MD ED   06/19/2018 1802 06/21/2018 1610 Full Code [id number]  Greer Pickerel, MD Inpatient   03/21/2017 1207 03/23/2017 1549 Full Code [id number]  Greer Pickerel, MD Inpatient      Home/SNF/Other Home  Chief Complaint Abdominal Pain  Level of Care/Admitting Diagnosis ED Disposition    ED Disposition Condition [facility]: [facility] [[id number]]  Level of Care: Med-Surg [16]  Diagnosis: SBO (small bowel obstruction) [facility] [[id number]]  Admitting Physician: Jones Skene  Attending Physician: CCS, MD [[id number]]  Estimated length of stay: 3 - 4 days  Certification:: I certify this patient will need inpatient services for at least 2 midnights  PT Class (Do Not Modify): Inpatient [101]  PT Acc Code (Do Not Modify): Private [1]       Medical History Past Medical History:  Diagnosis Date  . Allergic rhinitis   . Anemia   . Anxiety   . Barrett's esophagus   . Bipolar affective disorder ([place])   . CAP (community acquired pneumonia) 07/20/2018  . Chronic respiratory failure ([place])   . Constipation, chronic   . Degenerative joint disease of spine   . Depression   . Diabetes mellitus without complication ([place])    type 2   . Dry eye   . Family history of adverse reaction to anesthesia    nausea and vomiting  . Gastroparesis   . GERD (gastroesophageal reflux disease)   . History of bariatric surgery 03/2017  . History of hiatal hernia   . HLD (hyperlipidemia) 03/12/2013  . Hyperlipidemia   . Hypertension   . Intractable chronic migraine without aura 06/04/2015  . Migraine headache   . Morbid obesity ([place])   .  OSA (obstructive sleep apnea)    No cpap since gastric surgery  . Osteoarthritis    bilateral knee  . Sleep apnea   . Unspecified hypothyroidism   . Vitamin B 12 deficiency   . Vitamin D deficiency     Allergies Allergies  Allergen Reactions  . Ketoconazole Hives and Swelling    SWELLING REACTION UNSPECIFIED   . Pravachol [Pravastatin Sodium] Shortness Of Breath, Swelling and Anaphylaxis    Throat swelling  . Statins Other (See Comments)    Leg cramps  . Tape Dermatitis and Other (See Comments)    Steri-Strips  . Codeine Hypertension    increased BP    IV Location/Drains/Wounds Patient Lines/Drains/Airways Status   Active Line/Drains/Airways    Name:   Placement date:   Placement time:   Site:   Days:   Peripheral IV 03/19/19 Right Antecubital   03/19/19    0110    Antecubital   less than 1   Gastrostomy/Enterostomy Gastrostomy 18 Fr. LUQ   06/19/18    1536    LUQ   273   Incision (Closed) 10/11/18 Back   10/11/18    1418     159          Labs/Imaging Results for orders placed or performed during the hospital encounter of 03/19/19 (from the past 48 hour(s))  CBC with Differential     Status: None  Collection Time: 03/19/19 12:51 AM  Result Value Ref Range   WBC 6.5 4.0 - 10.5 K/uL   RBC 4.50 3.87 - 5.11 MIL/uL   Hemoglobin 13.5 12.0 - 15.0 g/dL   HCT 43.5 36.0 - 46.0 %   MCV 96.7 80.0 - 100.0 fL   MCH 30.0 26.0 - 34.0 pg   MCHC 31.0 30.0 - 36.0 g/dL   RDW 13.0 11.5 - 15.5 %   Platelets 168 150 - 400 K/uL   nRBC 0.0 0.0 - 0.2 %   Neutrophils Relative % 86 %   Neutro Abs 5.6 1.7 - 7.7 K/uL   Lymphocytes Relative 11 %   Lymphs Abs 0.7 0.7 - 4.0 K/uL   Monocytes Relative 3 %   Monocytes Absolute 0.2 0.1 - 1.0 K/uL   Eosinophils Relative 0 %   Eosinophils Absolute 0.0 0.0 - 0.5 K/uL   Basophils Relative 0 %   Basophils Absolute 0.0 0.0 - 0.1 K/uL   Immature Granulocytes 0 %   Abs Immature Granulocytes 0.01 0.00 - 0.07 K/uL    Comment: Performed at [facility], [place] [address] [place], [place] [address]  Comprehensive metabolic panel     Status: Abnormal   Collection Time: 03/19/19 12:51 AM  Result Value Ref Range   Sodium 141 135 - 145 mmol/L   Potassium 4.3 3.5 - 5.1 mmol/L   Chloride 101 98 - 111 mmol/L   CO2 31 22 - 32 mmol/L   Glucose, Bld 165 (H) 70 - 99 mg/dL   BUN 27 (H) 6 - 20 mg/dL   Creatinine, Ser 0.51 0.44 - 1.00 mg/dL   Calcium 8.9 8.9 - 10.3 mg/dL   Total Protein 7.4 6.5 - 8.1 g/dL   Albumin 4.1 3.5 - 5.0 g/dL   AST 25 15 - 41 U/L   ALT 22 0 - 44 U/L   Alkaline Phosphatase 44 38 - 126 U/L   Total Bilirubin 0.5 0.3 - 1.2 mg/dL   GFR calc non Af Amer >60 >60 mL/min   GFR calc Af Amer >60 >60 mL/min   Anion gap 9 5 - 15    Comment: Performed at [facility], [place] [address], [place], [place] [address]  Lipase, blood     Status: None   Collection Time: 03/19/19 12:51 AM  Result Value Ref Range   Lipase 42 11 - 51 U/L    Comment: Performed at [facility], [place] [address], [place], [place] [address]  Lactic acid, plasma     Status: Abnormal   Collection Time: 03/19/19 12:51 AM  Result Value Ref Range   Lactic Acid, Venous 2.7 (HH) 0.5 - 1.9 mmol/L    Comment: CRITICAL RESULT CALLED TO, READ BACK BY AND VERIFIED WITH: RN HODGES E. AT 0132 03/19/19 CRUICKSHANK A Performed at [facility], [place] [address], [place], [place] [address]   Urinalysis, Routine w reflex microscopic     Status: Abnormal   Collection Time: 03/19/19 12:52 AM  Result Value Ref Range   Color, Urine YELLOW YELLOW   APPearance CLEAR CLEAR   Specific Gravity, Urine 1.029 1.005 - 1.030   pH 5.0 5.0 - 8.0   Glucose, UA 50 (A) NEGATIVE mg/dL   Hgb urine dipstick NEGATIVE NEGATIVE   Bilirubin Urine NEGATIVE NEGATIVE   Ketones, ur 5 (A) NEGATIVE mg/dL   Protein, ur NEGATIVE NEGATIVE mg/dL   Nitrite NEGATIVE NEGATIVE   Leukocytes,Ua NEGATIVE NEGATIVE  Comment: Performed at  [facility], [place] [address], [place], [place] [address]   Ct Abdomen Pelvis W Contrast  Result Date: 03/19/2019 CLINICAL DATA:  Abd distension Nausea, vomiting Abd pain, acute, generalized History of multiple abdominal surgeries, new severe upper abdominal pain, nausea, vomiting, no flatus or bowel movement today. Mild distention. Concern for obstruction. EXAM: CT ABDOMEN AND PELVIS WITH CONTRAST TECHNIQUE: Multidetector CT imaging of the abdomen and pelvis was performed using the standard protocol following bolus administration of intravenous contrast. CONTRAST:  164mL OMNIPAQUE IOHEXOL 300 MG/ML  SOLN COMPARISON:  CT 06/12/2018 FINDINGS: Lower chest: Micronodularity in the included right middle and right lower lobes. Trace right pleural thickening without frank effusion. Coronary artery calcifications. Hepatobiliary: No focal hepatic lesion. Postcholecystectomy with mild biliary prominence. No visualized choledocholithiasis. Pancreas: No ductal dilatation or inflammation. Spleen: Normal in size without focal abnormality. Adrenals/Urinary Tract: No adrenal nodule. No hydronephrosis or perinephric edema. Homogeneous renal enhancement with symmetric excretion on delayed phase imaging. Small cyst in the left kidney. Urinary bladder is physiologically distended without wall thickening. Stomach/Bowel: Post gastric bypass. Fluid distends the excluded gastric remnant and duodenum. Mesenteric swirling in the central abdomen, image 52 series 5, new from prior exam. Small bowel in the left upper abdomen are fluid-filled and prominent. The jejunal anastomosis is displaced to the left abdomen. More distal small bowel demonstrates areas of bowel wall thickening, with generalized mesenteric edema. Moderate volume of stool throughout the colon. No pneumatosis. Appendix is surgically absent. Vascular/Lymphatic: Mild aortic atherosclerosis. No aneurysm. No enlarged lymph nodes in the abdomen or pelvis.  Reproductive: Uterus and bilateral adnexa are unremarkable. Other: Generalized mesenteric edema and engorgement. Small amount of free fluid in the abdomen and pelvis. No evidence of free air or localized abscess. Mild generalized subcutaneous edema of the body wall. Musculoskeletal: There are no acute or suspicious osseous abnormalities. Degenerative change in the spine. IMPRESSION: 1. Post gastric bypass. Mesenteric swirling in the central abdomen is new from prior exam and concerning for internal hernia. This causes obstruction of the duodenum, with dilated excluded gastric remnant and duodenal sweep. Mesenteric edema and engorgement of small bowel. Recommend surgical consultation. 2. Small amount of free fluid in the abdomen and pelvis. No free air. 3.  Aortic Atherosclerosis (ICD10-I70.0). These results were called by telephone at the time of interpretation on 03/19/2019 at 3:04 am to Dr. Marda Stalker , who verbally acknowledged these results. Electronically Signed   By: Keith Rake M.D.   On: 03/19/2019 03:05    Pending Labs Unresulted Labs (From admission, onward)    Start     Ordered   03/19/19 0052  Urine culture  ONCE - STAT,   STAT     03/19/19 0051   03/19/19 0051  Lactic acid, plasma  Now then every 2 hours,   STAT     03/19/19 0051   Signed and Held  HIV antibody (Routine Testing)  Once,   R     Signed and Held   Signed and Held  CBC  Tomorrow morning,   R     Signed and Held   Signed and Held  Basic metabolic panel  Tomorrow morning,   R     Signed and Held          Vitals/Pain Today's Vitals   03/19/19 0353 03/19/19 0400 03/19/19 0500 03/19/19 0530  BP:  111/77 111/85 130/85  Pulse:  73 77 77  Resp:  15 17 12   Temp:  SpO2:  93% 96% 95%  PainSc: Asleep       Isolation Precautions No active isolations  Medications Medications  sodium chloride (PF) 0.9 % injection (has no administration in time range)  morphine 4 MG/ML injection 4 mg (4 mg Intravenous  Given 03/19/19 0110)  ondansetron (ZOFRAN) injection 4 mg (4 mg Intravenous Given 03/19/19 0109)  sodium chloride 0.9 % bolus 1,000 mL (0 mLs Intravenous Stopped 03/19/19 0203)  iohexol (OMNIPAQUE) 300 MG/ML solution 100 mL (100 mLs Intravenous Contrast Given 03/19/19 0212)  morphine 4 MG/ML injection 4 mg (4 mg Intravenous Given 03/19/19 0323)  ondansetron (ZOFRAN) injection 4 mg (4 mg Intravenous Given 03/19/19 0323)  sodium chloride 0.9 % bolus 1,000 mL (0 mLs Intravenous Stopped 03/19/19 0439)    Mobility walks with device

## 2019-03-19 NOTE — ED Provider Notes (Signed)
Morgan [name] Provider Note   CSN: [id number] Arrival date & time: 03/19/19  0032    History   Chief Complaint Chief Complaint  Patient presents with  . Abdominal Pain    HPI Morgan [name] is a 53 y.o. female.     The history is provided by the patient and medical records. No language interpreter was used.  Abdominal Pain  Pain location:  Epigastric, RUQ and LUQ Pain quality: aching and sharp   Pain radiates to:  Does not radiate Pain severity:  Severe Onset quality:  Gradual Duration:  1 day Timing:  Constant Progression:  Worsening Chronicity:  New Context: previous surgery   Context: not trauma   Relieved by:  Nothing Worsened by:  Palpation and eating Ineffective treatments:  None tried Associated symptoms: flatus (no flatus), nausea and vomiting   Associated symptoms: no chest pain, no chills, no constipation (no BM since pain began), no cough, no diarrhea, no dysuria, no fatigue, no fever and no shortness of breath   Risk factors: multiple surgeries     Past Medical History:  Diagnosis Date  . Allergic rhinitis   . Anemia   . Anxiety   . Barrett's esophagus   . Bipolar affective disorder ([place])   . CAP (community acquired pneumonia) 07/20/2018  . Chronic respiratory failure ([place])   . Constipation, chronic   . Degenerative joint disease of spine   . Depression   . Diabetes mellitus without complication ([place])    type 2   . Dry eye   . Family history of adverse reaction to anesthesia    nausea and vomiting  . Gastroparesis   . GERD (gastroesophageal reflux disease)   . History of bariatric surgery 03/2017  . History of hiatal hernia   . HLD (hyperlipidemia) 03/12/2013  . Hyperlipidemia   . Hypertension   . Intractable chronic migraine without aura 06/04/2015  . Migraine headache   . Morbid obesity ([place])   . OSA (obstructive sleep apnea)    No cpap since gastric surgery  . Osteoarthritis    bilateral knee  . Sleep  apnea   . Unspecified hypothyroidism   . Vitamin B 12 deficiency   . Vitamin D deficiency     Patient Active Problem List   Diagnosis Date Noted  . Chronic migraine without aura, with intractable migraine, so stated, with status migrainosus 11/20/2018  . Lumbar radiculopathy 10/11/2018  . Atelectasis   . Hypoglycemia   . Hypotension   . Bradycardia   . Epigastric pain 06/19/2018  . H/O gastric bypass 12/20/2017  . History of adenomatous polyp of colon 10/23/2017  . Hyperlipidemia associated with type 2 diabetes mellitus ([place]) 07/06/2017  . GERD (gastroesophageal reflux disease) 03/21/2017  . S/P gastric bypass 03/21/2017  . Morbid obesity with BMI of 40.0-44.9, adult ([place]) 06/06/2016  . Intractable chronic migraine without aura 06/04/2015  . Abnormal uterine bleeding 05/13/2015  . Pancreatitis 02/27/2014  . Fatty liver disease, nonalcoholic 88/41/6606  . Bipolar disorder ([place]) 02/20/2014  . Metabolic syndrome 30/16/0109  . Vitamin D deficiency   . DM (diabetes mellitus) ([place]) 03/12/2013  . Preoperative evaluation to rule out surgical contraindication 06/12/2011  . Hypothyroidism   . Constipation, chronic   . Vitamin B 12 deficiency   . ALLERGIC RHINITIS 12/31/2010  . BARRETTS ESOPHAGUS 12/31/2010  . Gastroparesis 12/31/2010  . Bilateral chronic knee pain 12/31/2010  . Obstructive sleep apnea 09/27/2010  . Migraine 09/27/2010  . Hypertension associated with diabetes (  [place]) 09/27/2010  . RESPIRATORY FAILURE, CHRONIC 09/27/2010  . Cyst of ovary 10/19/2009    Past Surgical History:  Procedure Laterality Date  . APPENDECTOMY  1978  . CHOLECYSTECTOMY  2005  . COLONOSCOPY    . GASTRIC ROUX-EN-Y N/A 03/21/2017   Procedure: LAPAROSCOPIC ROUX-EN-Y GASTRIC, UPPER ENDO;  Surgeon: Greer Pickerel, MD;  Location: [facility];  Service: General;  Laterality: N/A;  . GASTROSTOMY N/A 06/19/2018   Procedure: LAPRASCOPIC INSERTION OF GASTROSTOMY TUBE;  Surgeon: Greer Pickerel, MD;  Location: [facility];  Service: General;  Laterality: N/A;  . GASTROSTOMY TUBE PLACEMENT Left    06/2018  . HERNIA REPAIR    . HIATAL HERNIA REPAIR N/A 06/19/2018   Procedure: LAPAROSCOPIC REPAIR OF HIATAL HERNIA;  Surgeon: Greer Pickerel, MD;  Location: [facility];  Service: General;  Laterality: N/A;  . LAPAROSCOPY N/A 06/19/2018   Procedure: LAPAROSCOPY DIAGNOSTIC;  Surgeon: Greer Pickerel, MD;  Location: [facility];  Service: General;  Laterality: N/A;  . LUMBAR LAMINECTOMY/DECOMPRESSION MICRODISCECTOMY Right 10/11/2018   Procedure: LAMINECTOMY AND FORAMINOTOMY RIGHT LUMBAR FOUR- LUMBAR FIVE;  Surgeon: Consuella Lose, MD;  Location: [facility];  Service: Neurosurgery;  Laterality: Right;  . SHOULDER ARTHROSCOPY  7/12   left-dsc  . TONSILLECTOMY  at age 60  . TRIGGER FINGER RELEASE  12/20/2012   Procedure: RELEASE TRIGGER FINGER/A-1 PULLEY;  Surgeon: Tennis Must, MD;  Location: [facility];  Service: Orthopedics;  Laterality: Left;  LEFT TRIGGER THUMB RELEASE     OB History   No obstetric history on file.      Home Medications    Prior to Admission medications   Medication Sig Start Date End Date Taking? Authorizing Provider  Calcium-Vitamin D-Vitamin K 650-12.5-40 MG-MCG-MCG CHEW Chew 1 tablet by mouth 2 (two) times daily.    [provider]  Cyanocobalamin (VITAMIN B-12 IJ) Inject 1,000 mcg as directed every 30 (thirty) days.    [provider]  divalproex (DEPAKOTE) 500 MG DR tablet TAKE 1 TABLET EVERY MORNING AND 2 TABLETS AT BEDTIME Patient taking differently: Take 500-1,000 mg by mouth See admin instructions. Take 500 mg by mouth every morning and 1000 mg at bedtime 10/02/18   Ward Givens, NP  fluticasone (FLONASE) 50 MCG/ACT nasal spray Place 2 sprays into both nostrils 2 (two) times daily as needed for allergies. 02/27/19   Janora Norlander, DO  Fremanezumab-vfrm (AJOVY) 225 MG/1.5ML SOSY Inject 140 mg into the skin every 30 (thirty) days. 11/20/18   Melvenia Beam, MD   gabapentin (NEURONTIN) 300 MG capsule Take 2 tabs PO in the AM, 1 tab at lunch and 2 tabs at bedtime. Patient taking differently: Take 300-600 mg by mouth See admin instructions. Take 600mg  by mouth in the morning, 300mg  at lunch and 600mg  at bedtime. 08/22/18   Ward Givens, NP  glucose blood (ONE TOUCH ULTRA TEST) test strip CHECK BLOOD SUGAR TWICE A DAY 01/04/19   Ronnie Doss M, DO  levothyroxine (SYNTHROID, LEVOTHROID) 112 MCG tablet TAKE 1 TABLET BEFORE BREAKFAST 02/27/19   Ronnie Doss M, DO  loratadine (CLARITIN) 10 MG tablet Take 10 mg by mouth daily as needed for allergies.     [provider]  Multiple Vitamin (MULTIVITAMIN) capsule Take 1 capsule by mouth daily.     [provider]  nystatin [facility]) powder APPLY TOPICALLY TWICE A DAY AS NEEDED Patient taking differently: Apply 1 g topically 2 (two) times daily as needed (yeast).  07/06/17   Timmothy Euler, MD  ondansetron (ZOFRAN) 4 MG tablet Take 1 tablet (4 mg total) by mouth every 8 (eight) hours. 11/20/18   Melvenia Beam, MD  pantoprazole (PROTONIX) 40 MG tablet Take 1 tablet (40 mg total) by mouth daily. 09/14/18   Gottschalk, Ashly M, DO  PAZEO 0.7 % SOLN Place 1 drop into both eyes every morning. 06/10/16   [provider]  polyethylene glycol (MIRALAX / GLYCOLAX) packet Take 17 g by mouth at bedtime.     [provider]  Probiotic Product (PROBIOTIC DAILY PO) Take 1 capsule by mouth daily.    [provider]  RESTASIS MULTIDOSE 0.05 % ophthalmic emulsion Place 1 drop into both eyes 2 (two) times daily. 06/17/16   [provider]  sertraline (ZOLOFT) 100 MG tablet Take 2 tablets (200 mg total) by mouth daily. 01/14/19   Janora Norlander, DO  tiZANidine (ZANAFLEX) 2 MG tablet Take 1 tablet (2 mg total) by mouth every 8 (eight) hours as needed for muscle spasms. 07/24/18   Aline August, MD  traMADol (ULTRAM) 50 MG tablet Take 50 mg by mouth every 6 (six) hours as  needed for severe pain.     [provider]  ziprasidone (GEODON) 60 MG capsule Take 1 capsule (60 mg total) by mouth at bedtime. 01/14/19   Janora Norlander, DO    Family History Family History  Problem Relation Age of Onset  . Asthma Father   . Allergies Father   . Heart disease Father        enlarged heart  . Peripheral vascular disease Father   . Diabetes Father   . Hyperlipidemia Father   . Arthritis Father   . Asthma Sister   . Cancer Sister        colon at 32 yr old.  . Colon cancer Sister   . Allergies Mother   . Stroke Mother 55       with hemi paralysis  . Diabetes Mother   . Hyperlipidemia Mother   . Hypertension Mother   . GI problems Mother   . Arthritis Mother   . Allergies Brother   . Early death Brother 9       congenital abormality  . Allergies Sister   . Diabetes Sister   . Asthma Sister   . Colon polyps Sister   . Hyperlipidemia Sister   . GI problems Sister        gastroporesis   . Liver disease Sister        fatty liver  . Stroke Sister        intercrandial bleed  . Diabetes Brother   . Hypertension Brother   . Hyperlipidemia Brother   . Heart disease Paternal Aunt   . Migraines Neg Hx     Social History Social History   Tobacco Use  . Smoking status: Never Smoker  . Smokeless tobacco: Never Used  Substance Use Topics  . Alcohol use: No  . Drug use: No     Allergies   Ketoconazole; Pravachol [pravastatin sodium]; Statins; Tape; and Codeine   Review of Systems Review of Systems  Constitutional: Negative for chills, diaphoresis, fatigue and fever.  HENT: Negative for congestion.   Respiratory: Negative for apnea, cough, chest tightness, shortness of breath, wheezing and stridor.   Cardiovascular: Negative for chest pain, palpitations and leg swelling.  Gastrointestinal: Positive for abdominal distention, abdominal pain, flatus (no flatus), nausea and vomiting. Negative for constipation (no BM since pain began) and  diarrhea.  Genitourinary: Negative for dysuria, flank pain and frequency.  Musculoskeletal: Negative for back pain, neck pain and neck stiffness.  Skin: Negative for rash and wound.  Neurological: Negative for light-headedness and headaches.  Psychiatric/Behavioral: Negative for agitation.  All other systems reviewed and are negative.    Physical Exam Updated Vital Signs LMP 10/17/2017 (Approximate)   Physical Exam Vitals signs and nursing note reviewed.  Constitutional:      General: She is not in acute distress.    Appearance: She is well-developed. She is not diaphoretic.  HENT:     Head: Normocephalic and atraumatic.     Right Ear: External ear normal.     Left Ear: External ear normal.     Nose: Nose normal.     Mouth/Throat:     Pharynx: No oropharyngeal exudate.  Eyes:     Extraocular Movements: Extraocular movements intact.     Conjunctiva/sclera: Conjunctivae normal.     Pupils: Pupils are equal, round, and reactive to light.  Neck:     Musculoskeletal: Normal range of motion and neck supple.  Cardiovascular:     Rate and Rhythm: Normal rate and regular rhythm.     Heart sounds: Normal heart sounds. No murmur.  Pulmonary:     Effort: No respiratory distress.     Breath sounds: No stridor. No wheezing, rhonchi or rales.  Chest:     Chest wall: No tenderness.  Abdominal:     General: Abdomen is flat. Bowel sounds are normal. Distention: per pt feels distended.     Tenderness: There is abdominal tenderness in the right upper quadrant, epigastric area and left upper quadrant. There is no right CVA tenderness, left CVA tenderness or rebound.    Skin:    General: Skin is warm.     Findings: No erythema or rash.  Neurological:     Mental Status: She is alert and oriented to person, place, and time.     Motor: No abnormal muscle tone.     Coordination: Coordination normal.     Deep Tendon Reflexes: Reflexes are normal and symmetric.      ED Treatments /  Results  Labs (all labs ordered are listed, but only abnormal results are displayed) Labs Reviewed  COMPREHENSIVE METABOLIC PANEL - Abnormal; Notable for the following components:      Result Value   Glucose, Bld 165 (*)    BUN 27 (*)    All other components within normal limits  LACTIC ACID, PLASMA - Abnormal; Notable for the following components:   Lactic Acid, Venous 2.7 (*)    All other components within normal limits  URINALYSIS, ROUTINE W REFLEX MICROSCOPIC - Abnormal; Notable for the following components:   Glucose, UA 50 (*)    Ketones, ur 5 (*)    All other components within normal limits  CBC - Abnormal; Notable for the following components:   Platelets 145 (*)    All other components within normal limits  BASIC METABOLIC PANEL - Abnormal; Notable for the following components:   Glucose, Bld 169 (*)    BUN 21 (*)    Calcium 8.2 (*)    All other components within normal limits  GLUCOSE, CAPILLARY - Abnormal; Notable for the following components:   Glucose-Capillary 145 (*)    All other components within normal limits  SURGICAL PCR SCREEN  URINE CULTURE  CBC WITH DIFFERENTIAL/PLATELET  LIPASE, BLOOD  LACTIC ACID, PLASMA  HIV ANTIBODY (ROUTINE TESTING W REFLEX)    EKG None  Radiology Ct Abdomen Pelvis W Contrast  Result Date: 03/19/2019 CLINICAL DATA:  Abd distension Nausea, vomiting Abd pain, acute, generalized History of multiple abdominal surgeries, new severe upper abdominal pain, nausea, vomiting, no flatus or bowel movement today. Mild distention. Concern for obstruction. EXAM: CT ABDOMEN AND PELVIS WITH CONTRAST TECHNIQUE: Multidetector CT imaging of the abdomen and pelvis was performed using the standard protocol following bolus administration of intravenous contrast. CONTRAST:  173mL OMNIPAQUE IOHEXOL 300 MG/ML  SOLN COMPARISON:  CT 06/12/2018 FINDINGS: Lower chest: Micronodularity in the included right middle and right lower lobes. Trace right pleural  thickening without frank effusion. Coronary artery calcifications. Hepatobiliary: No focal hepatic lesion. Postcholecystectomy with mild biliary prominence. No visualized choledocholithiasis. Pancreas: No ductal dilatation or inflammation. Spleen: Normal in size without focal abnormality. Adrenals/Urinary Tract: No adrenal nodule. No hydronephrosis or perinephric edema. Homogeneous renal enhancement with symmetric excretion on delayed phase imaging. Small cyst in the left kidney. Urinary bladder is physiologically distended without wall thickening. Stomach/Bowel: Post gastric bypass. Fluid distends the excluded gastric remnant and duodenum. Mesenteric swirling in the central abdomen, image 52 series 5, new from prior exam. Small bowel in the left upper abdomen are fluid-filled and prominent. The jejunal anastomosis is displaced to the left abdomen. More distal small bowel demonstrates areas of bowel wall thickening, with generalized mesenteric edema. Moderate volume of stool throughout the colon. No pneumatosis. Appendix is surgically absent. Vascular/Lymphatic: Mild aortic atherosclerosis. No aneurysm. No enlarged lymph nodes in the abdomen or pelvis. Reproductive: Uterus and bilateral adnexa are unremarkable. Other: Generalized mesenteric edema and engorgement. Small amount of free fluid in the abdomen and pelvis. No evidence of free air or localized abscess. Mild generalized subcutaneous edema of the body wall. Musculoskeletal: There are no acute or suspicious osseous abnormalities. Degenerative change in the spine. IMPRESSION: 1. Post gastric bypass. Mesenteric swirling in the central abdomen is new from prior exam and concerning for internal hernia. This causes obstruction of the duodenum, with dilated excluded gastric remnant and duodenal sweep. Mesenteric edema and engorgement of small bowel. Recommend surgical consultation. 2. Small amount of free fluid in the abdomen and pelvis. No free air. 3.  Aortic  Atherosclerosis (ICD10-I70.0). These results were called by telephone at the time of interpretation on 03/19/2019 at 3:04 am to Dr. Marda Stalker , who verbally acknowledged these results. Electronically Signed   By: Keith Rake M.D.   On: 03/19/2019 03:05    Procedures Procedures (including critical care time)  Medications Ordered in ED Medications  sodium chloride (PF) 0.9 % injection (has no administration in time range)  0.9 % NaCl with KCl 20 mEq/ L  infusion ( Intravenous New Bag/Given 03/19/19 0619)  fentaNYL (SUBLIMAZE) injection 25-50 mcg (50 mcg Intravenous Given 03/19/19 0618)  ondansetron (ZOFRAN-ODT) disintegrating tablet 4 mg (has no administration in time range)    Or  ondansetron (ZOFRAN) injection 4 mg (has no administration in time range)  pantoprazole (PROTONIX) injection 40 mg (has no administration in time range)  mupirocin ointment (BACTROBAN) 2 % 1 application (has no administration in time range)  ceFAZolin (ANCEF) IVPB 2g/100 mL premix (has no administration in time range)  promethazine (PHENERGAN) injection 12.5 mg (has no administration in time range)  morphine 4 MG/ML injection 4 mg (4 mg Intravenous Given 03/19/19 0110)  ondansetron (ZOFRAN) injection 4 mg (4 mg Intravenous Given 03/19/19 0109)  sodium chloride 0.9 % bolus 1,000 mL (0 mLs Intravenous Stopped 03/19/19 0203)  iohexol (OMNIPAQUE) 300 MG/ML solution 100 mL (100  mLs Intravenous Contrast Given 03/19/19 0212)  morphine 4 MG/ML injection 4 mg (4 mg Intravenous Given 03/19/19 0323)  ondansetron (ZOFRAN) injection 4 mg (4 mg Intravenous Given 03/19/19 0323)  sodium chloride 0.9 % bolus 1,000 mL (0 mLs Intravenous Stopped 03/19/19 0439)     Initial Impression / Assessment and Plan / ED Course  I have reviewed the triage vital signs and the nursing notes.  Pertinent labs & imaging results that were available during my care of the patient were reviewed by me and considered in my medical decision making (see  chart for details).        Morgan [name] is a 53 y.o. female with a past medical history significant for hypertension, hyperlipidemia, gastroparesis, prior pancreatitis, diabetes, Barrett's esophagus, hiatal hernia surgery, gastric bypass surgery, cholecystectomy and appendicectomy, hypothyroidism, bipolar disorder, sleep apnea, migraines, who presents with abdominal pain, nausea, vomiting, decreased flatus, and abdominal distention.  Patient reports that her symptoms began today and she had normal bowel movements morning.  She reports that since then she has not passed any gas or had a bowel movement.  She reports her abdomen feels slightly distended and she has had worsening abdominal pain in the epigastrium.  She reports this pain is different than prior pains.  She reports she tried to eat something tonight and had nausea and vomiting had nothing staying down.  She reports she still dry heaving.  She reports the pain in her upper abdomen is up to a 10 out of 10 in severity.  She denies any lower abdominal pain pelvic pain or urinary symptoms.  She denies any recent trauma.  Denies any fevers, chills, congestion, cough, chest pain, or shortness of breath.  Pain is all in the upper abdomen.  On exam, abdomen is tender across the upper abdomen.  Lungs are clear and chest is nontender.  Bowel sounds were appreciated on exam.  Legs are nontender nonedematous.  Vital signs reassuring on arrival.  Based on patient's report of multiple abdominal surgeries, decreased flatus, nausea, vomiting, abdominal pain, and concern about obstruction.  She will have labs and CT scan to further evaluate.  She will be given pain medicine, nausea, fluids.  She will be made n.p.o.  He reports he is tolerated IV pain medicine in the past and given morphine.  Anticipate reassessment after work-up.  Patient's CT scan revealed evidence of bowel obstruction.  General surgery was called who will come see patient.  Lactic acid  slightly elevated however kidney function and liver function unremarkable.  No leukocytosis.  Lipase not elevated.  Urinalysis does not show UTI.  Anticipate assessment by general surgery.  General surgery saw the patient and will admit for further management of likely small bowel obstruction.   Final Clinical Impressions(s) / ED Diagnoses   Final diagnoses:  Generalized abdominal pain  Intestinal obstruction, unspecified cause, unspecified whether partial or complete (HCC)  Non-intractable vomiting with nausea, unspecified vomiting type    ED Discharge Orders    None      Clinical Impression: 1. Generalized abdominal pain   2. Intestinal obstruction, unspecified cause, unspecified whether partial or complete ([place])   3. Non-intractable vomiting with nausea, unspecified vomiting type     Disposition: Admit  This note was prepared with assistance of Dragon voice recognition software. Occasional wrong-word or sound-a-like substitutions may have occurred due to the inherent limitations of voice recognition software.     Tegeler, Gwenyth Allegra, MD 03/19/19 [phone]

## 2019-03-19 NOTE — ED Notes (Signed)
Pt ambulated to restroom with minor assistance.

## 2019-03-19 NOTE — Anesthesia Preprocedure Evaluation (Signed)
Anesthesia Evaluation  Patient identified by MRN, date of birth, ID band Patient awake    Reviewed: Allergy & Precautions, NPO status , Patient's Chart, lab work & pertinent test results  Airway Mallampati: II  TM Distance: >3 FB Neck ROM: Full    Dental no notable dental hx.    Pulmonary neg pulmonary ROS,    Pulmonary exam normal breath sounds clear to auscultation       Cardiovascular hypertension, Normal cardiovascular exam Rhythm:Regular Rate:Normal     Neuro/Psych Bipolar Disorder negative neurological ROS     GI/Hepatic Neg liver ROS, GERD  ,  Endo/Other  diabetesHypothyroidism   Renal/GU negative Renal ROS  negative genitourinary   Musculoskeletal negative musculoskeletal ROS (+)   Abdominal   Peds negative pediatric ROS (+)  Hematology negative hematology ROS (+)   Anesthesia Other Findings   Reproductive/Obstetrics negative OB ROS                             Anesthesia Physical Anesthesia Plan  ASA: III  Anesthesia Plan: General   Post-op Pain Management:    Induction: Intravenous and Rapid sequence  PONV Risk Score and Plan: 3 and Ondansetron, Dexamethasone and Treatment may vary due to age or medical condition  Airway Management Planned: Oral ETT  Additional Equipment:   Intra-op Plan:   Post-operative Plan: Extubation in OR  Informed Consent: I have reviewed the patients History and Physical, chart, labs and discussed the procedure including the risks, benefits and alternatives for the proposed anesthesia with the patient or authorized representative who has indicated his/her understanding and acceptance.     Dental advisory given  Plan Discussed with: CRNA and Surgeon  Anesthesia Plan Comments:         Anesthesia Quick Evaluation

## 2019-03-19 NOTE — ED Triage Notes (Addendum)
Patient arrived with EMS from home complain of Abdominal Pain and n/v last night at 7 pm. Per patient pain started after she had lasagna for dinner.  Pt a/o x 4. Denies Fever. VS 133/89 HR 70 O2sat 99.

## 2019-03-19 NOTE — Anesthesia Postprocedure Evaluation (Signed)
Anesthesia Post Note  Patient: Morgan [name]  Procedure(s) Performed: LAPAROSCOPY DIAGNOSTIC lysis of adhesions (N/A Abdomen)     Patient location during evaluation: PACU Anesthesia Type: General Level of consciousness: awake and alert Pain management: pain level controlled Vital Signs Assessment: post-procedure vital signs reviewed and stable Respiratory status: spontaneous breathing, nonlabored ventilation, respiratory function stable and patient connected to nasal cannula oxygen Cardiovascular status: blood pressure returned to baseline and stable Postop Assessment: no apparent nausea or vomiting Anesthetic complications: no    Last Vitals:  Vitals:   03/19/19 1011 03/19/19 1347  BP: (!) 127/91 116/82  Pulse: 85 88  Resp: 14 12  Temp: 36.9 C (!) 36.3 C  SpO2: 96% 99%    Last Pain:  Vitals:   03/19/19 1347  TempSrc:   PainSc: 0-No pain                 Lilly Gasser S

## 2019-03-19 NOTE — Plan of Care (Signed)
Plan of care discussed.   

## 2019-03-19 NOTE — ED Notes (Signed)
General surgery at bedside. 

## 2019-03-19 NOTE — ED Notes (Signed)
Date and time results received: 03/19/19 1:32 AM  (use smartphrase ".now" to insert current time)  Test: Lactic Acid Critical Value: 2.7  Name of Provider Notified:Dr.Tegler   Orders Received? Or Actions Taken?:

## 2019-03-19 NOTE — Progress Notes (Signed)
Pt seen in holding.  Actively gagging   Chart, imaging reviewed  Discussed with dr Kae Heller.   Agree that pt needs dx laparoscopy possible exp lap discussed possible bowel resection  Ct definitely difft than last summer.  Concern for internal hernia  I discussed the procedure in detail.    We discussed the risks and benefits of surgery including, but not limited to bleeding, infection (such as wound infection, abdominal abscess), injury to surrounding structures, blood clot formation, urinary retention, incisional hernia, anastomotic stricture, anastomotic leak, anesthesia risks, pulmonary & cardiac complications such as pneumonia &/or heart attack, need for additional procedures, ileus, & prolonged hospitalization.  We discussed the typical postoperative recovery course, including limitations & restrictions postoperatively. I explained that the likelihood of improvement in their symptoms is good.  Leighton Ruff. Redmond Pulling, MD, FACS General, Bariatric, & Minimally Invasive Surgery [facility], [place]

## 2019-03-19 NOTE — ED Notes (Signed)
Patient transported to CT 

## 2019-03-19 NOTE — ED Notes (Signed)
Bed: WA25 Expected date:  Expected time:  Means of arrival:  Comments: [facility] 52 upper abdominal pain-hx gastric bypass 3 years ago-acid reflux/vomiting

## 2019-03-19 NOTE — Anesthesia Procedure Notes (Addendum)
Procedure Name: Intubation Performed by: Eben Burow, CRNA Pre-anesthesia Checklist: Patient identified, Emergency Drugs available, Suction available, Patient being monitored and Timeout performed Patient Re-evaluated:Patient Re-evaluated prior to induction Oxygen Delivery Method: Circle system utilized Preoxygenation: Pre-oxygenation with 100% oxygen Induction Type: IV induction, Cricoid Pressure applied and Rapid sequence Laryngoscope Size: Mac and 4 Grade View: Grade I Tube type: Oral Tube size: 7.5 mm Number of attempts: 1 Airway Equipment and Method: Stylet Placement Confirmation: ETT inserted through vocal cords under direct vision,  positive ETCO2 and breath sounds checked- equal and bilateral Secured at: 19 cm Tube secured with: Tape Dental Injury: Teeth and Oropharynx as per pre-operative assessment

## 2019-03-19 NOTE — Transfer of Care (Signed)
Immediate Anesthesia Transfer of Care Note  Patient: Morgan [name]  Procedure(s) Performed: Procedure(s): LAPAROSCOPY DIAGNOSTIC lysis of adhesions (N/A)  Patient Location: PACU  Anesthesia Type:General  Level of Consciousness:  sedated, patient cooperative and responds to stimulation  Airway & Oxygen Therapy:Patient Spontanous Breathing and Patient connected to face mask oxgen  Post-op Assessment:  Report given to PACU RN and Post -op Vital signs reviewed and stable  Post vital signs:  Reviewed and stable  Last Vitals:  Vitals:   03/19/19 0639 03/19/19 1011  BP: 125/86 (!) 127/91  Pulse: 77 85  Resp: 16 14  Temp: 36.7 C 36.9 C  SpO2: 65% 46%    Complications: No apparent anesthesia complications

## 2019-03-19 NOTE — H&P (Signed)
Morgan [name] is an 53 y.o. female.   Chief Complaint: abd pain HPI: The patient is a 53 year old white female who is about 2 years s/p gastric bypass surgery by Dr. Redmond Pulling. She is also about 9 months s/p repair of hiatal hernia and gastrostomy tube in proximal remnant. She was doing well until yesterday when she developed abd pain. She has had some dry heaves. She denies fever. She hasn't had flatus in a day or two but had a normal bm this am. CT shows possible internal hernia causing sbo  Past Medical History:  Diagnosis Date  . Allergic rhinitis   . Anemia   . Anxiety   . Barrett's esophagus   . Bipolar affective disorder ([place])   . CAP (community acquired pneumonia) 07/20/2018  . Chronic respiratory failure ([place])   . Constipation, chronic   . Degenerative joint disease of spine   . Depression   . Diabetes mellitus without complication ([place])    type 2   . Dry eye   . Family history of adverse reaction to anesthesia    nausea and vomiting  . Gastroparesis   . GERD (gastroesophageal reflux disease)   . History of bariatric surgery 03/2017  . History of hiatal hernia   . HLD (hyperlipidemia) 03/12/2013  . Hyperlipidemia   . Hypertension   . Intractable chronic migraine without aura 06/04/2015  . Migraine headache   . Morbid obesity ([place])   . OSA (obstructive sleep apnea)    No cpap since gastric surgery  . Osteoarthritis    bilateral knee  . Sleep apnea   . Unspecified hypothyroidism   . Vitamin B 12 deficiency   . Vitamin D deficiency     Past Surgical History:  Procedure Laterality Date  . APPENDECTOMY  1978  . CHOLECYSTECTOMY  2005  . COLONOSCOPY    . GASTRIC ROUX-EN-Y N/A 03/21/2017   Procedure: LAPAROSCOPIC ROUX-EN-Y GASTRIC, UPPER ENDO;  Surgeon: Greer Pickerel, MD;  Location: [facility];  Service: General;  Laterality: N/A;  . GASTROSTOMY N/A 06/19/2018   Procedure: LAPRASCOPIC INSERTION OF GASTROSTOMY TUBE;  Surgeon: Greer Pickerel, MD;  Location: [facility];  Service: General;   Laterality: N/A;  . GASTROSTOMY TUBE PLACEMENT Left    06/2018  . HERNIA REPAIR    . HIATAL HERNIA REPAIR N/A 06/19/2018   Procedure: LAPAROSCOPIC REPAIR OF HIATAL HERNIA;  Surgeon: Greer Pickerel, MD;  Location: [facility];  Service: General;  Laterality: N/A;  . LAPAROSCOPY N/A 06/19/2018   Procedure: LAPAROSCOPY DIAGNOSTIC;  Surgeon: Greer Pickerel, MD;  Location: [facility];  Service: General;  Laterality: N/A;  . LUMBAR LAMINECTOMY/DECOMPRESSION MICRODISCECTOMY Right 10/11/2018   Procedure: LAMINECTOMY AND FORAMINOTOMY RIGHT LUMBAR FOUR- LUMBAR FIVE;  Surgeon: Consuella Lose, MD;  Location: [facility];  Service: Neurosurgery;  Laterality: Right;  . SHOULDER ARTHROSCOPY  7/12   left-dsc  . TONSILLECTOMY  at age 62  . TRIGGER FINGER RELEASE  12/20/2012   Procedure: RELEASE TRIGGER FINGER/A-1 PULLEY;  Surgeon: Tennis Must, MD;  Location: [facility];  Service: Orthopedics;  Laterality: Left;  LEFT TRIGGER THUMB RELEASE    Family History  Problem Relation Age of Onset  . Asthma Father   . Allergies Father   . Heart disease Father        enlarged heart  . Peripheral vascular disease Father   . Diabetes Father   . Hyperlipidemia Father   . Arthritis Father   . Asthma Sister   . Cancer Sister  colon at 65 yr old.  . Colon cancer Sister   . Allergies Mother   . Stroke Mother 27       with hemi paralysis  . Diabetes Mother   . Hyperlipidemia Mother   . Hypertension Mother   . GI problems Mother   . Arthritis Mother   . Allergies Brother   . Early death Brother 9       congenital abormality  . Allergies Sister   . Diabetes Sister   . Asthma Sister   . Colon polyps Sister   . Hyperlipidemia Sister   . GI problems Sister        gastroporesis   . Liver disease Sister        fatty liver  . Stroke Sister        intercrandial bleed  . Diabetes Brother   . Hypertension Brother   . Hyperlipidemia Brother   . Heart disease Paternal Aunt   . Migraines Neg Hx    Social  History:  reports that she has never smoked. She has never used smokeless tobacco. She reports that she does not drink alcohol or use drugs.  Allergies:  Allergies  Allergen Reactions  . Ketoconazole Hives and Swelling    SWELLING REACTION UNSPECIFIED   . Pravachol [Pravastatin Sodium] Shortness Of Breath, Swelling and Anaphylaxis    Throat swelling  . Statins Other (See Comments)    Leg cramps  . Tape Dermatitis and Other (See Comments)    Steri-Strips  . Codeine Hypertension    increased BP    (Not in a hospital admission)   Results for orders placed or performed during the hospital encounter of 03/19/19 (from the past 48 hour(s))  CBC with Differential     Status: None   Collection Time: 03/19/19 12:51 AM  Result Value Ref Range   WBC 6.5 4.0 - 10.5 K/uL   RBC 4.50 3.87 - 5.11 MIL/uL   Hemoglobin 13.5 12.0 - 15.0 g/dL   HCT 43.5 36.0 - 46.0 %   MCV 96.7 80.0 - 100.0 fL   MCH 30.0 26.0 - 34.0 pg   MCHC 31.0 30.0 - 36.0 g/dL   RDW 13.0 11.5 - 15.5 %   Platelets 168 150 - 400 K/uL   nRBC 0.0 0.0 - 0.2 %   Neutrophils Relative % 86 %   Neutro Abs 5.6 1.7 - 7.7 K/uL   Lymphocytes Relative 11 %   Lymphs Abs 0.7 0.7 - 4.0 K/uL   Monocytes Relative 3 %   Monocytes Absolute 0.2 0.1 - 1.0 K/uL   Eosinophils Relative 0 %   Eosinophils Absolute 0.0 0.0 - 0.5 K/uL   Basophils Relative 0 %   Basophils Absolute 0.0 0.0 - 0.1 K/uL   Immature Granulocytes 0 %   Abs Immature Granulocytes 0.01 0.00 - 0.07 K/uL    Comment: Performed at [facility], [place] [address] [place], [place] [address]  Comprehensive metabolic panel     Status: Abnormal   Collection Time: 03/19/19 12:51 AM  Result Value Ref Range   Sodium 141 135 - 145 mmol/L   Potassium 4.3 3.5 - 5.1 mmol/L   Chloride 101 98 - 111 mmol/L   CO2 31 22 - 32 mmol/L   Glucose, Bld 165 (H) 70 - 99 mg/dL   BUN 27 (H) 6 - 20 mg/dL   Creatinine, Ser 0.51 0.44 - 1.00 mg/dL   Calcium 8.9 8.9 - 10.3 mg/dL    Total Protein  7.4 6.5 - 8.1 g/dL   Albumin 4.1 3.5 - 5.0 g/dL   AST 25 15 - 41 U/L   ALT 22 0 - 44 U/L   Alkaline Phosphatase 44 38 - 126 U/L   Total Bilirubin 0.5 0.3 - 1.2 mg/dL   GFR calc non Af Amer >60 >60 mL/min   GFR calc Af Amer >60 >60 mL/min   Anion gap 9 5 - 15    Comment: Performed at [facility], [place] [address] [place], [place] [address]  Lipase, blood     Status: None   Collection Time: 03/19/19 12:51 AM  Result Value Ref Range   Lipase 42 11 - 51 U/L    Comment: Performed at [facility], [place] [address] [place], [place] [address]  Lactic acid, plasma     Status: Abnormal   Collection Time: 03/19/19 12:51 AM  Result Value Ref Range   Lactic Acid, Venous 2.7 (HH) 0.5 - 1.9 mmol/L    Comment: CRITICAL RESULT CALLED TO, READ BACK BY AND VERIFIED WITH: RN HODGES E. AT 0132 03/19/19 CRUICKSHANK A Performed at [facility], [place] [address] [place], [place] [address]   Urinalysis, Routine w reflex microscopic     Status: Abnormal   Collection Time: 03/19/19 12:52 AM  Result Value Ref Range   Color, Urine YELLOW YELLOW   APPearance CLEAR CLEAR   Specific Gravity, Urine 1.029 1.005 - 1.030   pH 5.0 5.0 - 8.0   Glucose, UA 50 (A) NEGATIVE mg/dL   Hgb urine dipstick NEGATIVE NEGATIVE   Bilirubin Urine NEGATIVE NEGATIVE   Ketones, ur 5 (A) NEGATIVE mg/dL   Protein, ur NEGATIVE NEGATIVE mg/dL   Nitrite NEGATIVE NEGATIVE   Leukocytes,Ua NEGATIVE NEGATIVE    Comment: Performed at [facility] [address], [place], [place] [address]   Ct Abdomen Pelvis W Contrast  Result Date: 03/19/2019 CLINICAL DATA:  Abd distension Nausea, vomiting Abd pain, acute, generalized History of multiple abdominal surgeries, new severe upper abdominal pain, nausea, vomiting, no flatus or bowel movement today. Mild distention. Concern for obstruction. EXAM: CT ABDOMEN AND PELVIS WITH CONTRAST TECHNIQUE: Multidetector CT  imaging of the abdomen and pelvis was performed using the standard protocol following bolus administration of intravenous contrast. CONTRAST:  173mL OMNIPAQUE IOHEXOL 300 MG/ML  SOLN COMPARISON:  CT 06/12/2018 FINDINGS: Lower chest: Micronodularity in the included right middle and right lower lobes. Trace right pleural thickening without frank effusion. Coronary artery calcifications. Hepatobiliary: No focal hepatic lesion. Postcholecystectomy with mild biliary prominence. No visualized choledocholithiasis. Pancreas: No ductal dilatation or inflammation. Spleen: Normal in size without focal abnormality. Adrenals/Urinary Tract: No adrenal nodule. No hydronephrosis or perinephric edema. Homogeneous renal enhancement with symmetric excretion on delayed phase imaging. Small cyst in the left kidney. Urinary bladder is physiologically distended without wall thickening. Stomach/Bowel: Post gastric bypass. Fluid distends the excluded gastric remnant and duodenum. Mesenteric swirling in the central abdomen, image 52 series 5, new from prior exam. Small bowel in the left upper abdomen are fluid-filled and prominent. The jejunal anastomosis is displaced to the left abdomen. More distal small bowel demonstrates areas of bowel wall thickening, with generalized mesenteric edema. Moderate volume of stool throughout the colon. No pneumatosis. Appendix is surgically absent. Vascular/Lymphatic: Mild aortic atherosclerosis. No aneurysm. No enlarged lymph nodes in the abdomen or pelvis. Reproductive: Uterus and bilateral adnexa are unremarkable. Other: Generalized mesenteric edema and engorgement. Small amount of free fluid in the abdomen and  pelvis. No evidence of free air or localized abscess. Mild generalized subcutaneous edema of the body wall. Musculoskeletal: There are no acute or suspicious osseous abnormalities. Degenerative change in the spine. IMPRESSION: 1. Post gastric bypass. Mesenteric swirling in the central abdomen is  new from prior exam and concerning for internal hernia. This causes obstruction of the duodenum, with dilated excluded gastric remnant and duodenal sweep. Mesenteric edema and engorgement of small bowel. Recommend surgical consultation. 2. Small amount of free fluid in the abdomen and pelvis. No free air. 3.  Aortic Atherosclerosis (ICD10-I70.0). These results were called by telephone at the time of interpretation on 03/19/2019 at 3:04 am to Dr. Marda Stalker , who verbally acknowledged these results. Electronically Signed   By: Keith Rake M.D.   On: 03/19/2019 03:05    Review of Systems  Constitutional: Positive for weight loss. Negative for chills and fever.  HENT: Negative.   Eyes: Negative.   Respiratory: Negative.   Cardiovascular: Negative.   Gastrointestinal: Positive for abdominal pain, nausea and vomiting.  Genitourinary: Negative.   Musculoskeletal: Negative.   Skin: Negative.   Neurological: Negative.   Endo/Heme/Allergies: Negative.   Psychiatric/Behavioral: Negative.     Blood pressure 111/77, pulse 73, temperature 98.8 F (37.1 C), resp. rate 15, last menstrual period 10/17/2017, SpO2 93 %. Physical Exam  Constitutional: She is oriented to person, place, and time. She appears well-developed and well-nourished. No distress.  HENT:  Head: Normocephalic and atraumatic.  Mouth/Throat: No oropharyngeal exudate.  Eyes: Pupils are equal, round, and reactive to light. Conjunctivae and EOM are normal.  Neck: Normal range of motion. Neck supple.  Cardiovascular: Normal rate, regular rhythm and normal heart sounds.  Respiratory: Effort normal and breath sounds normal. No stridor. No respiratory distress.  GI: Soft. Bowel sounds are normal.  There is mild to moderate central tenderness but no guarding or distension  Musculoskeletal: Normal range of motion.        General: No tenderness or edema.  Neurological: She is alert and oriented to person, place, and time.  Coordination normal.  Skin: Skin is warm and dry. No rash noted.  Psychiatric: She has a normal mood and affect. Her behavior is normal. Thought content normal.     Assessment/Plan The patient appears to have at least a partial sbo possibly related to internal hernia. I will plan to admit for bowel rest and IV hydration. Will discuss with Dr. Redmond Pulling and primary team in am. It is possible she may require surgery to look for the internal hernia and fix   Autumn Messing III, MD 03/19/2019, 4:36 AM

## 2019-03-19 NOTE — Op Note (Signed)
Operative Note  Morgan [name]  [id number]  [id number]  03/19/2019   Surgeon: Victorino Sparrow ConnorMD  Assistant: Greer Pickerel MD  Procedure performed: diagnostic laparoscopy, lysis of obstructing adhesion  Preop diagnosis: small bowel obstruction in setting of prior roux en y gastric bypass Post-op diagnosis/intraop findings: thin band adhesion between the greater curvature of the excluded stomach and the abdominal wall causing small bowel obstruction  Specimens: no Retained items: no EBL: minimal cc Complications: none  Description of procedure: After obtaining informed consent the patient was taken to the operating room and placed supine on operating room table wheregeneral endotracheal anesthesia was initiated, preoperative antibiotics were administered, SCDs applied, and a formal timeout was performed.  Peritoneal access was gained using the open/ Hassan technique at the supraumbilical position.  The abdomen was then insufflated to 15 mmHg without incident.  Gross inspection demonstrated no evidence of injury from entry, but quite dilated but viable small bowel in the upper and mid abdomen. Under direct visualization 2 left-sided and 1 right-sided 5 mm trocar were inserted.  Upon closer inspection, the point of obstruction was noted to be a thin bandlike adhesion to the anterior abdominal wall from the greater curvature of the excluded stomach essentially in the midline upper abdomen.  This was divided sharply and confirmed to be hemostatic.  The obstruction was then essentially completely released.  There was a small deserosalization of the stomach wall in this location which was oversewn with 3-0 Vicryls.  We then inspected the bowel thoroughly, starting at the Roux limb and progressing down towards the jejunojejunostomy which was noted to be dilated which is chronic.  There was no mesenteric defect at this location.  The biliopancreatic limb was traced back to the ligament of Treitz and  then the common channel was followed distally confirming no other point of obstruction or internal hernia.  We then ran the small bowel from the terminal ileum proximally confirming the same.  A small amount of chylous ascites was aspirated throughout the abdomen.  All the bowel appeared to be viable and there was no evidence of perforation.  At this juncture the procedure was completed.  The hasson trocar was removed and the pursetring Vicryl tied down to close the fascial defect here.  Upon completion is noted to be airtight and this was visualized laparoscopically as well confirming no entrapped structures.  The abdomen was then desufflated and the remaining trochars were removed.  Skin incisions were closed with subcuticular Monocryl and Dermabond. The patient was then awakened, extubated and taken to PACU in stable condition.   All counts were correct at the completion of the case.

## 2019-03-19 NOTE — Progress Notes (Addendum)
Subjective/Chief Complaint: Persistent nausea. Dry heaves. Pain a little better after medication.    Objective: Vital signs in last 24 hours: Temp:  [98.1 F (36.7 C)-98.8 F (37.1 C)] 98.1 F (36.7 C) (04/07 0639) Pulse Rate:  [66-81] 77 (04/07 0639) Resp:  [12-18] 16 (04/07 0639) BP: (97-130)/(69-86) 125/86 (04/07 0639) SpO2:  [93 %-100 %] 93 % (04/07 0639) Weight:  [65.5 kg] 65.5 kg (04/07 0600)    Intake/Output from previous day: 04/06 0701 - 04/07 0700 In: 2000 [IV Piggyback:2000] Out: -  Intake/Output this shift: No intake/output data recorded.  Abdomen is soft, mildly distended, tender in epigastrium  Lab Results:  Recent Labs    03/19/19 0051 03/19/19 0659  WBC 6.5 4.8  HGB 13.5 12.9  HCT 43.5 39.6  PLT 168 145*   BMET Recent Labs    03/19/19 0051 03/19/19 0659  NA 141 141  K 4.3 4.0  CL 101 106  CO2 31 25  GLUCOSE 165* 169*  BUN 27* 21*  CREATININE 0.51 0.45  CALCIUM 8.9 8.2*   PT/INR No results for input(s): LABPROT, INR in the last 72 hours. ABG No results for input(s): PHART, HCO3 in the last 72 hours.  Invalid input(s): PCO2, PO2  Studies/Results: Ct Abdomen Pelvis W Contrast  Result Date: 03/19/2019 CLINICAL DATA:  Abd distension Nausea, vomiting Abd pain, acute, generalized History of multiple abdominal surgeries, new severe upper abdominal pain, nausea, vomiting, no flatus or bowel movement today. Mild distention. Concern for obstruction. EXAM: CT ABDOMEN AND PELVIS WITH CONTRAST TECHNIQUE: Multidetector CT imaging of the abdomen and pelvis was performed using the standard protocol following bolus administration of intravenous contrast. CONTRAST:  161mL OMNIPAQUE IOHEXOL 300 MG/ML  SOLN COMPARISON:  CT 06/12/2018 FINDINGS: Lower chest: Micronodularity in the included right middle and right lower lobes. Trace right pleural thickening without frank effusion. Coronary artery calcifications. Hepatobiliary: No focal hepatic lesion.  Postcholecystectomy with mild biliary prominence. No visualized choledocholithiasis. Pancreas: No ductal dilatation or inflammation. Spleen: Normal in size without focal abnormality. Adrenals/Urinary Tract: No adrenal nodule. No hydronephrosis or perinephric edema. Homogeneous renal enhancement with symmetric excretion on delayed phase imaging. Small cyst in the left kidney. Urinary bladder is physiologically distended without wall thickening. Stomach/Bowel: Post gastric bypass. Fluid distends the excluded gastric remnant and duodenum. Mesenteric swirling in the central abdomen, image 52 series 5, new from prior exam. Small bowel in the left upper abdomen are fluid-filled and prominent. The jejunal anastomosis is displaced to the left abdomen. More distal small bowel demonstrates areas of bowel wall thickening, with generalized mesenteric edema. Moderate volume of stool throughout the colon. No pneumatosis. Appendix is surgically absent. Vascular/Lymphatic: Mild aortic atherosclerosis. No aneurysm. No enlarged lymph nodes in the abdomen or pelvis. Reproductive: Uterus and bilateral adnexa are unremarkable. Other: Generalized mesenteric edema and engorgement. Small amount of free fluid in the abdomen and pelvis. No evidence of free air or localized abscess. Mild generalized subcutaneous edema of the body wall. Musculoskeletal: There are no acute or suspicious osseous abnormalities. Degenerative change in the spine. IMPRESSION: 1. Post gastric bypass. Mesenteric swirling in the central abdomen is new from prior exam and concerning for internal hernia. This causes obstruction of the duodenum, with dilated excluded gastric remnant and duodenal sweep. Mesenteric edema and engorgement of small bowel. Recommend surgical consultation. 2. Small amount of free fluid in the abdomen and pelvis. No free air. 3.  Aortic Atherosclerosis (ICD10-I70.0). These results were called by telephone at the time of interpretation on 03/19/2019  at 3:04 am to Dr. Marda Stalker , who verbally acknowledged these results. Electronically Signed   By: Keith Rake M.D.   On: 03/19/2019 03:05    Anti-infectives: Anti-infectives (From admission, onward)   Start     Dose/Rate Route Frequency Ordered Stop   03/19/19 0745  ceFAZolin (ANCEF) IVPB 2g/100 mL premix     2 g 200 mL/hr over 30 Minutes Intravenous On call to O.R. 03/19/19 0744 03/20/19 0559      Assessment/Plan: Suspected internal hernia and partial SBO. Plan for diagnostic laparoscopy today. Discussed with her primary surgeon Dr. Redmond Pulling who will be available to participate in the case later this morning.  Discussed plan and risks of surgery including bleeding, infection, pain, scarring, injury to intraabdominal structures, need for bowel resection, conversion to open surgery, failure to resolve preoperative symptoms, as well as general risks of DVT/PE, MI, stroke, pneumonia, prolonged hospitalization and recovery. Patient expressed understanding. Questions welcomed and answered to her satisfaction.    LOS: 0 days    Morgan [name] 03/19/2019

## 2019-03-20 ENCOUNTER — Encounter (HOSPITAL_COMMUNITY): Payer: Self-pay | Admitting: Surgery

## 2019-03-20 LAB — CBC
HCT: 29.5 % — ABNORMAL LOW (ref 36.0–46.0)
Hemoglobin: 9.4 g/dL — ABNORMAL LOW (ref 12.0–15.0)
MCH: 30.6 pg (ref 26.0–34.0)
MCHC: 31.9 g/dL (ref 30.0–36.0)
MCV: 96.1 fL (ref 80.0–100.0)
Platelets: 125 10*3/uL — ABNORMAL LOW (ref 150–400)
RBC: 3.07 MIL/uL — ABNORMAL LOW (ref 3.87–5.11)
RDW: 13.5 % (ref 11.5–15.5)
WBC: 2 10*3/uL — ABNORMAL LOW (ref 4.0–10.5)
nRBC: 0 % (ref 0.0–0.2)

## 2019-03-20 LAB — BASIC METABOLIC PANEL
Anion gap: 5 (ref 5–15)
BUN: 20 mg/dL (ref 6–20)
CO2: 28 mmol/L (ref 22–32)
Calcium: 7.7 mg/dL — ABNORMAL LOW (ref 8.9–10.3)
Chloride: 110 mmol/L (ref 98–111)
Creatinine, Ser: 0.44 mg/dL (ref 0.44–1.00)
GFR calc Af Amer: >60 mL/min (ref >60)
GFR calc non Af Amer: >60 mL/min (ref >60)
Glucose, Bld: 86 mg/dL (ref 70–99)
Potassium: 3.7 mmol/L (ref 3.5–5.1)
Sodium: 143 mmol/L (ref 135–145)

## 2019-03-20 LAB — URINE CULTURE: Culture: NO GROWTH

## 2019-03-20 LAB — GLUCOSE, CAPILLARY
Glucose-Capillary: 112 mg/dL — ABNORMAL HIGH (ref 70–99)
Glucose-Capillary: 76 mg/dL (ref 70–99)
Glucose-Capillary: 88 mg/dL (ref 70–99)

## 2019-03-20 MED ORDER — SODIUM CHLORIDE 0.9% FLUSH
3.0000 mL | Freq: Two times a day (BID) | INTRAVENOUS | Status: DC
  Administered 2019-03-20: 3 mL via INTRAVENOUS

## 2019-03-20 MED ORDER — SODIUM CHLORIDE 0.9 % IV SOLN: 250.0000 mL | INTRAVENOUS | Status: DC | PRN

## 2019-03-20 MED ORDER — HYDRALAZINE HCL 20 MG/ML IJ SOLN: 10.0000 mg | Freq: Four times a day (QID) | INTRAMUSCULAR | Status: DC | PRN

## 2019-03-20 MED ORDER — SODIUM CHLORIDE 0.9% FLUSH: 3.0000 mL | INTRAVENOUS | Status: DC | PRN

## 2019-03-20 MED ORDER — POTASSIUM CHLORIDE CRYS ER 20 MEQ PO TBCR
30.0000 meq | EXTENDED_RELEASE_TABLET | Freq: Once | ORAL | Status: AC
  Administered 2019-03-20: 30 meq via ORAL
  Filled 2019-03-20: qty 1

## 2019-03-20 MED ORDER — INSULIN ASPART 100 UNIT/ML ~~LOC~~ SOLN: 0.0000 [IU] | Freq: Three times a day (TID) | SUBCUTANEOUS | Status: DC

## 2019-03-20 MED ORDER — ACETAMINOPHEN 500 MG PO TABS: 1000.0000 mg | ORAL_TABLET | Freq: Four times a day (QID) | ORAL | Status: DC | PRN

## 2019-03-20 MED ORDER — LACTATED RINGERS IV BOLUS: 1000.0000 mL | Freq: Three times a day (TID) | INTRAVENOUS | Status: DC | PRN

## 2019-03-20 NOTE — Progress Notes (Signed)
1 Day Post-Op  Subjective: CC: No complaints Doing well this morning and overnight. No abdominal pain, n/v. Pain controlled with oral medications. Has had several episodes of flatus. Normal sized, soft BM this am. Mobilizing in halls. Using IS. Tolerated CLD.   Objective: Vital signs in last 24 hours: Temp:  [97.3 F (36.3 C)-99.5 F (37.5 C)] 98.2 F (36.8 C) (04/08 0741) Pulse Rate:  [59-88] 59 (04/08 0741) Resp:  [11-18] 14 (04/08 0741) BP: (85-133)/(59-87) 96/66 (04/08 0741) SpO2:  [94 %-100 %] 98 % (04/08 0741) Last BM Date: 03/20/19  Intake/Output from previous day: 04/07 0701 - 04/08 0700 In: 3800.6 [P.O.:110; I.V.:3590.6; IV Piggyback:100] Out: 1500 [Urine:1475; Blood:25] Intake/Output this shift: Total I/O In: 1123.8 [P.O.:680; I.V.:443.8] Out: 250 [Urine:250]  PE: Gen: Awake and alert, NAD Heart: RRR Lungs: CTA b/l, normal effort. Pulling 1750 on IS.  Abd: Soft, ND, NT, +BS. Laparoscopic incisions with dermabond overlying and c/d/i.   Lab Results:  Recent Labs    03/19/19 0659 03/20/19 0330  WBC 4.8 2.0*  HGB 12.9 9.4*  HCT 39.6 29.5*  PLT 145* 125*   BMET Recent Labs    03/19/19 0659 03/20/19 0330  NA 141 143  K 4.0 3.7  CL 106 110  CO2 25 28  GLUCOSE 169* 86  BUN 21* 20  CREATININE 0.45 0.44  CALCIUM 8.2* 7.7*   PT/INR No results for input(s): LABPROT, INR in the last 72 hours. CMP     Component Value Date/Time   NA 143 03/20/2019 0330   NA 146 (H) 01/14/2019 0840   K 3.7 03/20/2019 0330   CL 110 03/20/2019 0330   CO2 28 03/20/2019 0330   GLUCOSE 86 03/20/2019 0330   BUN 20 03/20/2019 0330   BUN 19 01/14/2019 0840   CREATININE 0.44 03/20/2019 0330   CREATININE 0.82 06/18/2013 1009   CALCIUM 7.7 (L) 03/20/2019 0330   PROT 7.4 03/19/2019 0051   PROT 6.1 01/14/2019 0840   ALBUMIN 4.1 03/19/2019 0051   ALBUMIN 4.2 01/14/2019 0840   AST 25 03/19/2019 0051   ALT 22 03/19/2019 0051   ALKPHOS 44 03/19/2019 0051   BILITOT 0.5  03/19/2019 0051   BILITOT 0.2 01/14/2019 0840   GFRNONAA >60 03/20/2019 0330   GFRNONAA 87 06/18/2013 1009   GFRAA >60 03/20/2019 0330   GFRAA >89 06/18/2013 1009   Lipase     Component Value Date/Time   LIPASE 42 03/19/2019 0051       Studies/Results: Ct Abdomen Pelvis W Contrast  Result Date: 03/19/2019 CLINICAL DATA:  Abd distension Nausea, vomiting Abd pain, acute, generalized History of multiple abdominal surgeries, new severe upper abdominal pain, nausea, vomiting, no flatus or bowel movement today. Mild distention. Concern for obstruction. EXAM: CT ABDOMEN AND PELVIS WITH CONTRAST TECHNIQUE: Multidetector CT imaging of the abdomen and pelvis was performed using the standard protocol following bolus administration of intravenous contrast. CONTRAST:  166mL OMNIPAQUE IOHEXOL 300 MG/ML  SOLN COMPARISON:  CT 06/12/2018 FINDINGS: Lower chest: Micronodularity in the included right middle and right lower lobes. Trace right pleural thickening without frank effusion. Coronary artery calcifications. Hepatobiliary: No focal hepatic lesion. Postcholecystectomy with mild biliary prominence. No visualized choledocholithiasis. Pancreas: No ductal dilatation or inflammation. Spleen: Normal in size without focal abnormality. Adrenals/Urinary Tract: No adrenal nodule. No hydronephrosis or perinephric edema. Homogeneous renal enhancement with symmetric excretion on delayed phase imaging. Small cyst in the left kidney. Urinary bladder is physiologically distended without wall thickening. Stomach/Bowel: Post gastric bypass. Fluid  distends the excluded gastric remnant and duodenum. Mesenteric swirling in the central abdomen, image 52 series 5, new from prior exam. Small bowel in the left upper abdomen are fluid-filled and prominent. The jejunal anastomosis is displaced to the left abdomen. More distal small bowel demonstrates areas of bowel wall thickening, with generalized mesenteric edema. Moderate volume of  stool throughout the colon. No pneumatosis. Appendix is surgically absent. Vascular/Lymphatic: Mild aortic atherosclerosis. No aneurysm. No enlarged lymph nodes in the abdomen or pelvis. Reproductive: Uterus and bilateral adnexa are unremarkable. Other: Generalized mesenteric edema and engorgement. Small amount of free fluid in the abdomen and pelvis. No evidence of free air or localized abscess. Mild generalized subcutaneous edema of the body wall. Musculoskeletal: There are no acute or suspicious osseous abnormalities. Degenerative change in the spine. IMPRESSION: 1. Post gastric bypass. Mesenteric swirling in the central abdomen is new from prior exam and concerning for internal hernia. This causes obstruction of the duodenum, with dilated excluded gastric remnant and duodenal sweep. Mesenteric edema and engorgement of small bowel. Recommend surgical consultation. 2. Small amount of free fluid in the abdomen and pelvis. No free air. 3.  Aortic Atherosclerosis (ICD10-I70.0). These results were called by telephone at the time of interpretation on 03/19/2019 at 3:04 am to Dr. Marda Stalker , who verbally acknowledged these results. Electronically Signed   By: Keith Rake M.D.   On: 03/19/2019 03:05    Anti-infectives: Anti-infectives (From admission, onward)   Start     Dose/Rate Route Frequency Ordered Stop   03/19/19 1115  ceFAZolin (ANCEF) IVPB 2g/100 mL premix     2 g 200 mL/hr over 30 Minutes Intravenous On call to O.R. 03/19/19 0744 03/19/19 1225       Assessment/Plan HLD GERD HTN - running soft, placed on 179ml/hr (EF 55-60%). Encourage oral intake. PRN meds DM (140 - 180 overnight). HgbA1c 5.5 on 02/13/19. Not on any home medications. SSI Thyroid disease - home levothyroxine  ABL Anemia - 9.4 this am. Baseline 12-13. Suspect some dilutional effect. VSS. Recheck AM hgb.   SBO in setting of remote roux-en-y (Dr. Redmond Pulling, 03/21/17) - CT on 4/7 w/ SBO concerning for internal hernia -  s/p diagnostic laparoscopy w/ lysis of obstructing adhesion (thin band adhesion between the greater curvature of the excluded stomach and the abdominal wall causing small bowel obstruction), Dr. Romana Juniper, 4/7 - POD 1 - Advance diet - Mobilize for bowel fucntion. Pulm toliet/IS - Keep K > 4 and Mg > 2 for bowel function  FEN - DYS 1 diet, AAT -> soft, bowel regimen, K 3.7 (replace orally), saline lock VTE - SCD, heparin, mobilize ID - Ancef periop 4/7. WBC 2.0 Foley - None Follow up - TBD, Dr. Redmond Pulling  Plan: Doing well clinically. No abdominal pain, N/V. Tolerating CLD. Having bowel function. Advance diet. Mobilize and IS. Keep K > 4 and Mg > 2 for bowel function. Monitor Hgb, BP and CBGs.    LOS: 1 day    Jillyn Ledger , [facility] 03/20/2019, 11:01 AM Pager: [phone]

## 2019-03-20 NOTE — Plan of Care (Signed)
  Problem: Clinical Measurements: Goal: Ability to maintain clinical measurements within normal limits will improve Outcome: Progressing Goal: Will remain free from infection Outcome: Progressing Goal: Diagnostic test results will improve Outcome: Progressing Goal: Respiratory complications will improve Outcome: Progressing Goal: Cardiovascular complication will be avoided Outcome: Progressing   Problem: Activity: Goal: Risk for activity intolerance will decrease Outcome: Progressing   Problem: Nutrition: Goal: Adequate nutrition will be maintained Outcome: Progressing   Problem: Elimination: Goal: Will not experience complications related to bowel motility Outcome: Progressing   Problem: Safety: Goal: Ability to remain free from injury will improve Outcome: Progressing   Problem: Skin Integrity: Goal: Risk for impaired skin integrity will decrease Outcome: Progressing   Problem: Education: Goal: Required Educational Video(s) Outcome: Progressing   Problem: Clinical Measurements: Goal: Postoperative complications will be avoided or minimized Outcome: Progressing   Problem: Skin Integrity: Goal: Demonstration of wound healing without infection will improve Outcome: Progressing  Plan of care discussed with patient

## 2019-03-21 LAB — CBC
HCT: 29.9 % — ABNORMAL LOW (ref 36.0–46.0)
Hemoglobin: 9.5 g/dL — ABNORMAL LOW (ref 12.0–15.0)
MCH: 31 pg (ref 26.0–34.0)
MCHC: 31.8 g/dL (ref 30.0–36.0)
MCV: 97.7 fL (ref 80.0–100.0)
Platelets: 106 10*3/uL — ABNORMAL LOW (ref 150–400)
RBC: 3.06 MIL/uL — ABNORMAL LOW (ref 3.87–5.11)
RDW: 13.4 % (ref 11.5–15.5)
WBC: 2.7 10*3/uL — ABNORMAL LOW (ref 4.0–10.5)
nRBC: 0 % (ref 0.0–0.2)

## 2019-03-21 LAB — GLUCOSE, CAPILLARY: Glucose-Capillary: 66 mg/dL — ABNORMAL LOW (ref 70–99)

## 2019-03-21 MED ORDER — ACETAMINOPHEN 500 MG PO TABS: 1000.0000 mg | ORAL_TABLET | Freq: Four times a day (QID) | ORAL | 0 refills | Status: DC | PRN

## 2019-03-21 MED ORDER — OXYCODONE HCL 5 MG PO TABS: 5.0000 mg | ORAL_TABLET | Freq: Four times a day (QID) | ORAL | 0 refills | Status: DC | PRN

## 2019-03-21 MED ORDER — DOCUSATE SODIUM 100 MG PO CAPS: 100.0000 mg | ORAL_CAPSULE | Freq: Two times a day (BID) | ORAL | 0 refills | Status: DC

## 2019-03-21 NOTE — Discharge Summary (Signed)
Patient ID: Morgan [name] [id number] Mar 18, 1966 53 y.o.  Admit date: 03/19/2019 Discharge date: 03/21/2019  Admitting Diagnosis: SBO  Discharge Diagnosis Patient Active Problem List   Diagnosis Date Noted  . SBO (small bowel obstruction) s/p lap LOA 03/19/2019 03/19/2019  . Chronic migraine without aura, with intractable migraine, so stated, with status migrainosus 11/20/2018  . Lumbar radiculopathy 10/11/2018  . Atelectasis   . Hypoglycemia   . Bradycardia   . Epigastric pain 06/19/2018  . History of adenomatous polyp of colon 10/23/2017  . Hyperlipidemia associated with type 2 diabetes mellitus ([place]) 07/06/2017  . GERD (gastroesophageal reflux disease) 03/21/2017  . History of Roux-en-Y gastric bypass 2018 03/21/2017  . Morbid obesity with BMI of 40.0-44.9, adult ([place]) 06/06/2016  . Intractable chronic migraine without aura 06/04/2015  . Abnormal uterine bleeding 05/13/2015  . Pancreatitis 02/27/2014  . Fatty liver disease, nonalcoholic 73/53/2992  . Bipolar disorder ([place]) 02/20/2014  . Metabolic syndrome 42/68/3419  . Vitamin D deficiency   . DM (diabetes mellitus) ([place]) 03/12/2013  . Hypothyroidism   . Constipation, chronic   . Vitamin B 12 deficiency   . ALLERGIC RHINITIS 12/31/2010  . BARRETTS ESOPHAGUS 12/31/2010  . Gastroparesis 12/31/2010  . Bilateral chronic knee pain 12/31/2010  . Obstructive sleep apnea 09/27/2010  . Migraine 09/27/2010  . RESPIRATORY FAILURE, CHRONIC 09/27/2010  . Cyst of ovary 10/19/2009    Consultants None  Procedures Diagnostic laparoscopy w/ lysis of obstructing adhesion (thin band adhesion between the greater curvature of the excluded stomach and the abdominal wall causing small bowel obstruction), Dr. Romana Juniper, 4/7  Hospital Course:  Morgan [name] is a 53 year old white female who is about 2 years s/p gastric bypass surgery by Dr. Redmond Pulling. She is also about 9 months s/p repair of hiatal hernia and gastrostomy tube in  proximal remnant.On 4/6 she developed abd pain and dry heaves. CT shows possible internal hernia causing sbo. She was admitted to the general surgery service.   On 4/7 the patient underwent a diagnostic laparoscopy w/ lysis of obstructing adhesion (thin band adhesion between the greater curvature of the excluded stomach and the abdominal wall causing small bowel obstruction) by Dr. Romana Juniper with Dr. Redmond Pulling assisting. The patient tolerated the procedure well and was transferred to the floor. Her diet was advanced as tolerated. On POD 2, the patient was voiding well, tolerating diet, ambulating well, pain well controlled with oral medications, vital signs stable, incisions c/d/i and felt stable for discharge home. Appropriate follow up was made. Time was given for all questions to be answered. Patient was discharged in good condition.   Physical Exam: Gen: Awake and alert, NAD Heart: Regular Lungs: Normal effort Abd: Soft, ND, NT, +BS. Incisions c/d/i Msk: No edmea.   Allergies as of 03/21/2019      Reactions   Ketoconazole Hives, Swelling   SWELLING REACTION UNSPECIFIED    Pravachol [pravastatin Sodium] Shortness Of Breath, Swelling, Anaphylaxis   Throat swelling   Statins Other (See Comments)   Leg cramps   Tape Dermatitis, Other (See Comments)   Steri-Strips   Codeine Hypertension   increased BP      Medication List    STOP taking these medications   traMADol 50 MG tablet Commonly known as:  ULTRAM     TAKE these medications   acetaminophen 500 MG tablet Commonly known as:  TYLENOL Take 2 tablets (1,000 mg total) by mouth every 6 (six) hours as needed for mild pain, moderate  pain, fever or headache.   Calcium-Vitamin D-Vitamin K 650-12.5-40 MG-MCG-MCG Chew Chew 1 tablet by mouth 2 (two) times daily.   divalproex 500 MG DR tablet Commonly known as:  DEPAKOTE TAKE 1 TABLET EVERY MORNING AND 2 TABLETS AT BEDTIME What changed:  See the new instructions.   docusate sodium  100 MG capsule Commonly known as:  COLACE Take 1 capsule (100 mg total) by mouth 2 (two) times daily.   fluticasone 50 MCG/ACT nasal spray Commonly known as:  FLONASE Place 2 sprays into both nostrils 2 (two) times daily as needed for allergies.   Fremanezumab-vfrm 225 MG/1.5ML Sosy Commonly known as:  Ajovy Inject 140 mg into the skin every 30 (thirty) days.   gabapentin 300 MG capsule Commonly known as:  NEURONTIN Take 2 tabs PO in the AM, 1 tab at lunch and 2 tabs at bedtime. What changed:    how much to take  how to take this  when to take this  additional instructions   glucose blood test strip Commonly known as:  ONE TOUCH ULTRA TEST CHECK BLOOD SUGAR TWICE A DAY   levothyroxine 112 MCG tablet Commonly known as:  SYNTHROID, LEVOTHROID TAKE 1 TABLET BEFORE BREAKFAST What changed:  See the new instructions.   loratadine 10 MG tablet Commonly known as:  CLARITIN Take 10 mg by mouth daily as needed for allergies.   multivitamin capsule Take 1 capsule by mouth daily.   nystatin powder Commonly known as:  Nyamyc APPLY TOPICALLY TWICE A DAY AS NEEDED What changed:    how much to take  how to take this  when to take this  reasons to take this  additional instructions   ondansetron 4 MG tablet Commonly known as:  Zofran Take 1 tablet (4 mg total) by mouth every 8 (eight) hours.   oxyCODONE 5 MG immediate release tablet Commonly known as:  Oxy IR/ROXICODONE Take 1 tablet (5 mg total) by mouth every 6 (six) hours as needed for moderate pain.   pantoprazole 40 MG tablet Commonly known as:  PROTONIX Take 1 tablet (40 mg total) by mouth daily.   Pazeo 0.7 % Soln Generic drug:  Olopatadine HCl Place 1 drop into both eyes every morning.   polyethylene glycol 17 g packet Commonly known as:  MIRALAX / GLYCOLAX Take 17 g by mouth at bedtime.   PROBIOTIC DAILY PO Take 1 capsule by mouth daily.   Restasis Multidose 0.05 % ophthalmic emulsion Generic  drug:  cycloSPORINE Place 1 drop into both eyes 2 (two) times daily.   sertraline 100 MG tablet Commonly known as:  ZOLOFT Take 2 tablets (200 mg total) by mouth daily.   tiZANidine 2 MG tablet Commonly known as:  ZANAFLEX Take 1 tablet (2 mg total) by mouth every 8 (eight) hours as needed for muscle spasms.   ziprasidone 60 MG capsule Commonly known as:  GEODON Take 1 capsule (60 mg total) by mouth at bedtime.        Follow-up Information    Greer Pickerel, MD. Call on 04/03/2019.   Specialty:  General Surgery Why:  4/22 at 845am. Due to the coronavirus the will be a televisit. You will recieve a call at the time of your appointment. Please email a picture of your incisions and your name to [email or web address] a few days before your appointment.  Contact information: [place] [address] [phone]           Signed: Alferd Apa, PA-C  [facility] 03/21/2019, 8:58 AM Pager: [phone]

## 2019-03-21 NOTE — Discharge Instructions (Signed)
[facility]  Please arrive at least 30 min before your appointment to complete your check in paperwork.  If you are unable to arrive 30 min prior to your appointment time we may have to cancel or reschedule you. LAPAROSCOPIC SURGERY: POST OP INSTRUCTIONS Always review your discharge instruction sheet given to you by the facility where your surgery was performed. IF YOU HAVE DISABILITY OR FAMILY LEAVE FORMS, YOU MUST BRING THEM TO THE OFFICE FOR PROCESSING.   DO NOT GIVE THEM TO YOUR DOCTOR.  PAIN CONTROL  1. First take acetaminophen (Tylenol) to control your pain after surgery.  Follow directions on package.  Taking acetaminophen (Tylenol) and/or ibuprofen (Advil) regularly after surgery will help to control your pain and lower the amount of prescription pain medication you may need.  You should not take more than 4,000 mg (4 grams) of acetaminophen (Tylenol) in 24 hours.  Do not take Ibuprofen/NSAIDs with your history of gastric bypass 2. A prescription for pain medication may be given to you upon discharge.  Take your pain medication as prescribed, if you still have uncontrolled pain after taking acetaminophen (Tylenol) 3. Use ice packs to help control pain. 4. If you need a refill on your pain medication, please contact your pharmacy.  They will contact our office to request authorization. Prescriptions will not be filled after 5pm or on week-ends.  HOME MEDICATIONS 5. Take your usually prescribed medications unless otherwise directed.  DIET 6. You should follow a light diet the first few days after arrival home.  Be sure to include lots of fluids daily. Avoid fatty, fried foods.   CONSTIPATION 7. It is common to experience some constipation after surgery and if you are taking pain medication. Please take Colace as instructed upon discharge.  Increasing fluid intake will usually help or prevent this problem from occurring.  A mild laxative (Milk of Magnesia or Miralax)  should be taken according to package instructions if there are no bowel movements after 48 hours.  WOUND/INCISION CARE 8. Most patients will experience some swelling and bruising in the area of the incisions.  Ice packs will help.  Swelling and bruising can take several days to resolve.  9. Unless discharge instructions indicate otherwise, follow guidelines below  a. STERI-STRIPS - you may remove your outer bandages 48 hours after surgery, and you may shower at that time.  You have steri-strips (small skin tapes) in place directly over the incision.  These strips should be left on the skin for 7-10 days.   b. DERMABOND/SKIN GLUE - you may shower in 24 hours.  The glue will flake off over the next 2-3 weeks. 10. Any sutures or staples will be removed at the office during your follow-up visit.  ACTIVITIES 11. You may resume regular (light) daily activities beginning the next day--such as daily self-care, walking, climbing stairs--gradually increasing activities as tolerated.  You may have sexual intercourse when it is comfortable.  Refrain from any heavy lifting or straining until approved by your doctor. Do lift any greater than 10lbs before your follow up appointment.  a. You may drive when you are no longer taking prescription pain medication, you can comfortably wear a seatbelt, and you can safely maneuver your car and apply brakes.  FOLLOW-UP 12. You should see your doctor in the office for a follow-up appointment approximately 2-3 weeks after your surgery.  You should have been given your post-op/follow-up appointment when your surgery was scheduled.  If you did not receive  a post-op/follow-up appointment, make sure that you call for this appointment within a day or two after you arrive home to insure a convenient appointment time.   WHEN TO CALL YOUR DOCTOR: 1. Fever over 101.0 2. Inability to urinate 3. Continued bleeding from incision. 4. Increased pain, redness, or drainage from the  incision. 5. Increasing abdominal pain  The clinic staff is available to answer your questions during regular business hours.  Please dont hesitate to call and ask to speak to one of the nurses for clinical concerns.  If you have a medical emergency, go to the nearest emergency room or call 911.  A surgeon from [facility] is always on call at the hospital. [address], [place], [place], [place]  [address] ? [address], [place], [place]   [address] [phone] ? [phone] ? FAX [phone]

## 2019-03-21 NOTE — Progress Notes (Signed)
Discharge instructions discussed with patient, verbalized agreement and understanding 

## 2019-03-21 NOTE — Plan of Care (Signed)
Plan of care reviewed and discussed with the patient. 

## 2019-03-29 ENCOUNTER — Other Ambulatory Visit: Payer: Self-pay | Admitting: Adult Health

## 2019-04-08 ENCOUNTER — Other Ambulatory Visit: Payer: Self-pay | Admitting: Neurosurgery

## 2019-04-08 NOTE — Pre-Procedure Instructions (Signed)
Morgan [name]            02/12/2019                          [place], [place] [address] Phone: [phone] Fax: [phone]              Your procedure is scheduled on Friday, May 8th 2020.            Report to [facility] Entrance "A" at 5:30 AM.            Call this number if you have problems the morning of surgery:            [phone]             Remember:            Do not eat or drink after midnight.             Take these medicines the morning of surgery with A SIP OF WATER  divalproex (DEPAKOTE)  gabapentin (NEURONTIN) levothyroxine (SYNTHROID, LEVOTHROID)  ondansetron (ZOFRAN) pantoprazole (PROTONIX)  Eye Drops sertraline (ZOLOFT)  IF NEEDED: Flonase nasal spray loratadine (CLARITIN)  Tizanidine (zanaflex)- for muscle spasms acetaminophen (TYLENOL)  oxyCODONE (OXY IR/ROXICODONE)   7 days prior to surgery STOP taking any Aspirin (unless otherwise instructed by your surgeon), Aleve, Naproxen, Ibuprofen, Motrin, Advil, Goody's, BC's, all herbal medications, fish oil, and all vitamins              WHAT DO I DO ABOUT MY DIABETES MEDICATION?    The day of surgery, do not take other diabetes injectables, including FREMANEZUMAB-FRM (AJOVY)    Reviewed and Endorsed by [facility] Patient Education Committee, August 2015  HOW TO [facility] YOUR DIABETES BEFORE AND AFTER SURGERY  Why is it important to control my blood sugar before and after surgery?  Improving blood sugar levels before and after surgery helps healing and can limit problems.  A way of improving blood sugar control is eating a healthy diet by: ?  Eating less sugar and carbohydrates ?  Increasing activity/exercise ?  Talking with your doctor about reaching your blood sugar goals  High blood sugars (greater than 180 mg/dL) can raise your risk of infections and slow your recovery, so you will need to  focus on controlling your diabetes during the weeks before surgery.  Make sure that the doctor who takes care of your diabetes knows about your planned surgery including the date and location.  How do I manage my blood sugar before surgery?  Check your blood sugar at least 4 times a day, starting 2 days before surgery, to make sure that the level is not too high or low. ? Check your blood sugar the morning of your surgery when you wake up and every 2 hours until you get to the Short Stay unit.  If your blood sugar is less than 70 mg/dL, you will need to treat for low blood sugar: ? Do not take insulin. ? Treat a low blood sugar (less than 70 mg/dL) with  cup of clear juice (cranberry or apple), 4glucose tablets, OR glucose gel. Recheck blood sugar in 15 minutes after treatment (to make sure it is greater than 70 mg/dL). If your blood sugar is not greater than 70 mg/dL on recheck, call  [phone] ?  for further instructions.  Report your blood sugar to the short stay nurse when you get to Short Stay.   If you are admitted to the hospital after surgery: ? Your blood sugar will be checked by the staff and you will probably be given insulin after surgery (instead of oral diabetes medicines) to make sure you have good blood sugar levels. ? The goal for blood sugar control after surgery is 80-180 mg/dL.   Morgan [name]- Preparing For Surgery  Before surgery, you can play an important role. Because skin is not sterile, your skin needs to be as free of germs as possible. You can reduce the number of germs on your skin by washing with CHG (chlorahexidine gluconate) Soap before surgery.  CHG is an antiseptic cleaner which kills germs and bonds with the skin to continue killing germs even after washing.    Oral Hygiene is also important to reduce your risk of infection.  Remember - BRUSH YOUR TEETH THE MORNING OF SURGERY WITH YOUR REGULAR TOOTHPASTE  Please do not use if you have an  allergy to CHG or antibacterial soaps. If your skin becomes reddened/irritated stop using the CHG.  Do not shave (including legs and underarms) for at least 48 hours prior to first CHG shower. It is OK to shave your face.  Please follow these instructions carefully.                                                                                                                     1. Shower the NIGHT BEFORE SURGERY and the MORNING OF SURGERY with CHG.   2. If you chose to wash your hair, wash your hair first as usual with your normal shampoo.  3. After you shampoo, rinse your hair and body thoroughly to remove the shampoo.  4. Use CHG as you would any other liquid soap. You can apply CHG directly to the skin and wash gently with a scrungie or a clean washcloth.   5. Apply the CHG Soap to your body ONLY FROM THE NECK DOWN.  Do not use on open wounds or open sores. Avoid contact with your eyes, ears, mouth and genitals (private parts). Wash Face and genitals (private parts)  with your normal soap.  6. Wash thoroughly, paying special attention to the area where your surgery will be performed.  7. Thoroughly rinse your body with warm water from the neck down.  8. DO NOT shower/wash with your normal soap after using and rinsing off the CHG Soap.  9. Pat yourself dry with a CLEAN TOWEL.  10. Wear CLEAN PAJAMAS to bed the night before surgery, wear comfortable clothes the morning of surgery  11. Place CLEAN SHEETS on your bed the night of your first shower and DO NOT SLEEP WITH PETS.    Day of Surgery:  Do not apply any deodorants/lotions.  Please wear clean clothes to the hospital/surgery center.   Remember to brush your teeth WITH YOUR REGULAR TOOTHPASTE.  Do not wear jewelry, make-up or nail polish.            Do not wear lotions, powders, or perfumes, or deodorant.            Do not shave 48 hours prior to surgery.              Do not bring valuables to  the hospital.            [facility] is not responsible for any belongings or valuables.  Contacts, dentures or bridgework may not be worn into surgery.  Leave your suitcase in the car.  After surgery it may be brought to your room.  For patients admitted to the hospital, discharge time will be determined by your treatment team.  Patients discharged the day of surgery will not be allowed to drive home.   Please read over the following fact sheets that you were given

## 2019-04-09 ENCOUNTER — Encounter (HOSPITAL_COMMUNITY)
Admission: RE | Admit: 2019-04-09 | Discharge: 2019-04-09 | Disposition: A | Discharge status: Home / Self Care | Payer: Medicare Other | Source: Ambulatory Visit | Attending: Neurosurgery | Admitting: Neurosurgery

## 2019-04-09 ENCOUNTER — Encounter (HOSPITAL_COMMUNITY): Payer: Self-pay

## 2019-04-09 ENCOUNTER — Other Ambulatory Visit: Payer: Self-pay

## 2019-04-09 DIAGNOSIS — Z01812 Encounter for preprocedural laboratory examination: Secondary | ICD-10-CM | POA: Diagnosis not present

## 2019-04-09 LAB — BASIC METABOLIC PANEL
Anion gap: 7 (ref 5–15)
BUN: 20 mg/dL (ref 6–20)
CO2: 34 mmol/L — ABNORMAL HIGH (ref 22–32)
Calcium: 8.5 mg/dL — ABNORMAL LOW (ref 8.9–10.3)
Chloride: 102 mmol/L (ref 98–111)
Creatinine, Ser: 0.63 mg/dL (ref 0.44–1.00)
GFR calc Af Amer: >60 mL/min (ref >60)
GFR calc non Af Amer: >60 mL/min (ref >60)
Glucose, Bld: 78 mg/dL (ref 70–99)
Potassium: 4.3 mmol/L (ref 3.5–5.1)
Sodium: 143 mmol/L (ref 135–145)

## 2019-04-09 LAB — CBC
HCT: 38.8 % (ref 36.0–46.0)
Hemoglobin: 12.2 g/dL (ref 12.0–15.0)
MCH: 30.5 pg (ref 26.0–34.0)
MCHC: 31.4 g/dL (ref 30.0–36.0)
MCV: 97 fL (ref 80.0–100.0)
Platelets: 203 10*3/uL (ref 150–400)
RBC: 4 MIL/uL (ref 3.87–5.11)
RDW: 13.2 % (ref 11.5–15.5)
WBC: 2.9 10*3/uL — ABNORMAL LOW (ref 4.0–10.5)
nRBC: 0 % (ref 0.0–0.2)

## 2019-04-09 LAB — SURGICAL PCR SCREEN
MRSA, PCR: NEGATIVE
Staphylococcus aureus: NEGATIVE

## 2019-04-09 LAB — TYPE AND SCREEN
ABO/RH(D): AB POS
Antibody Screen: NEGATIVE

## 2019-04-09 LAB — GLUCOSE, CAPILLARY: Glucose-Capillary: 95 mg/dL (ref 70–99)

## 2019-04-09 NOTE — Progress Notes (Addendum)
PCP - Dr. Ronnie Doss  Cardiologist - denies  Chest x-ray - 09/25/18 EKG - 07/22/18 Stress Test - 09/30/16 ECHO - 07/23/18 Cardiac Cath - denies  Sleep Study -OSA+ prior to bariatric surgery. Recent sleep study 05/30/18 (E), pt states she was negative.   CPAP - Has not used since bariatric surgery.   Fasting Blood Sugar - 46-150 Checks Blood Sugar __2___ times a day Last A1C: 5.5 on 02/13/19 Pt has been taken off all diabetic medications since having bariatric surgery. CBG at PAT appointment today, 95.   Blood Thinner Instructions:N/A Aspirin Instructions:N/A  Anesthesia review: Yes, recent surgery 03/19/19, previous note from [place], [place] on 02/14/19. Pt was pre-admitted and then cancelled d/t COVID-19 restrictions.   Patient denies shortness of breath, fever, cough and chest pain at PAT appointment   Patient verbalized understanding of instructions that were given to them at the PAT appointment. Patient was also instructed that they will need to review over the PAT instructions again at home before surgery.   Coronavirus Screening  Have you experienced the following symptoms:  Cough yes/no: No Fever (>100.21F)  yes/no: No Runny nose yes/no: No Sore throat yes/no: No Difficulty breathing/shortness of breath  yes/no: No  Have you or a family member traveled in the last 14 days and where? yes/no: No   If the patient indicates "YES" to the above questions, their PAT will be rescheduled to limit the exposure to others and, the surgeon will be notified. THE PATIENT WILL NEED TO BE ASYMPTOMATIC FOR 14 DAYS.   If the patient is not experiencing any of these symptoms, the PAT nurse will instruct them to NOT bring anyone with them to their appointment since they may have these symptoms or traveled as well.   Please remind your patients and families that hospital visitation restrictions are in effect and the importance of the restrictions.

## 2019-04-09 NOTE — Progress Notes (Signed)
Left a message with Dr. Cleotilde Neer surgical scheduler regarding pt's lab work. WBC was 2.9. Left callback number for any additional information or questions.   Jacqlyn Larsen, RN

## 2019-04-19 ENCOUNTER — Ambulatory Visit (INDEPENDENT_AMBULATORY_CARE_PROVIDER_SITE_OTHER): Payer: Medicare Other | Admitting: *Deleted

## 2019-04-19 ENCOUNTER — Telehealth: Payer: Self-pay | Admitting: Family Medicine

## 2019-04-19 ENCOUNTER — Other Ambulatory Visit: Payer: Self-pay

## 2019-04-19 DIAGNOSIS — E538 Deficiency of other specified B group vitamins: Secondary | ICD-10-CM | POA: Diagnosis not present

## 2019-04-19 NOTE — Progress Notes (Signed)
Pt given Cyanocobalamin inj Tolerated well 

## 2019-04-19 NOTE — Telephone Encounter (Signed)
Talked to [facility]

## 2019-04-29 ENCOUNTER — Other Ambulatory Visit: Payer: Self-pay | Admitting: Family Medicine

## 2019-05-01 ENCOUNTER — Other Ambulatory Visit: Payer: Self-pay | Admitting: Neurosurgery

## 2019-05-09 ENCOUNTER — Other Ambulatory Visit (HOSPITAL_COMMUNITY): Payer: Medicare Other

## 2019-05-13 NOTE — Pre-Procedure Instructions (Signed)
Morgan [name]  05/13/2019     [place], [place] [address] Phone: [phone] Fax: [phone]   Your procedure is scheduled on Friday, June 5th  Report to [facility] Entrance A at 10:25 A.M.  Call this number if you have problems the morning of surgery:  [phone]   Remember:  Do not eat or drink after midnight.     Take these medicines the morning of surgery with A SIP OF WATER divalproex (DEPAKOTE)  docusate sodium (COLACE). gabapentin (NEURONTIN) levothyroxine (SYNTHROID) ondansetron (ZOFRAN) sertraline (ZOLOFT) tiZANidine (ZANAFLEX)  Eye drops: PAZEO 0.7 % SOLN, RESTASIS MULTIDOSE 0.05 % ophthalmic emulsion,   If needed - acetaminophen (TYLENOL), fluticasone (FLONASE),  loratadine (CLARITIN), oxyCODONE (OXY IR/ROXICODONE) ,    Follow your surgeon's instructions on when to stop Aspirin.  If no instructions were given by your surgeon then you will need to call the office to get those instructions.    7 days prior to surgery STOP taking any Aspirin (unless otherwise instructed by your surgeon), Aleve, Naproxen, Ibuprofen, Motrin, Advil, Goody's, BC's, all herbal medications, fish oil, and all vitamins.   Do not wear jewelry, make-up or nail polish.  Do not wear lotions, powders, or perfumes, or deodorant.  Do not shave 48 hours prior to surgery.  Men may shave face and neck.  Do not bring valuables to the hospital.  [facility] is not responsible for any belongings or valuables.  Contacts, dentures or bridgework may not be worn into surgery.  Leave your suitcase in the car.  After surgery it may be brought to your room.  For patients admitted to the hospital, discharge time will be determined by your treatment team.  Patients discharged the day of surgery will not be allowed to drive home.   Special instructions:   - Preparing For Surgery  Before surgery, you can play an  important role. Because skin is not sterile, your skin needs to be as free of germs as possible. You can reduce the number of germs on your skin by washing with CHG (chlorahexidine gluconate) Soap before surgery.  CHG is an antiseptic cleaner which kills germs and bonds with the skin to continue killing germs even after washing.    Oral Hygiene is also important to reduce your risk of infection.  Remember - BRUSH YOUR TEETH THE MORNING OF SURGERY WITH YOUR REGULAR TOOTHPASTE  Please do not use if you have an allergy to CHG or antibacterial soaps. If your skin becomes reddened/irritated stop using the CHG.  Do not shave (including legs and underarms) for at least 48 hours prior to first CHG shower. It is OK to shave your face.  Please follow these instructions carefully.   1. Shower the NIGHT BEFORE SURGERY and the MORNING OF SURGERY with CHG.   2. If you chose to wash your hair, wash your hair first as usual with your normal shampoo.  3. After you shampoo, rinse your hair and body thoroughly to remove the shampoo.  4. Use CHG as you would any other liquid soap. You can apply CHG directly to the skin and wash gently with a scrungie or a clean washcloth.   5. Apply the CHG Soap to your body ONLY FROM THE NECK DOWN.  Do not use on open wounds or open sores. Avoid contact with your eyes, ears, mouth and genitals (private parts). Wash Face and genitals (private parts)  with your  normal soap.  6. Wash thoroughly, paying special attention to the area where your surgery will be performed.  7. Thoroughly rinse your body with warm water from the neck down.  8. DO NOT shower/wash with your normal soap after using and rinsing off the CHG Soap.  9. Pat yourself dry with a CLEAN TOWEL.  10. Wear CLEAN PAJAMAS to bed the night before surgery, wear comfortable clothes the morning of surgery  11. Place CLEAN SHEETS on your bed the night of your first shower and DO NOT SLEEP WITH PETS.  Day of  Surgery:  Do not apply any deodorants/lotions.  Please wear clean clothes to the hospital/surgery center.   Remember to brush your teeth WITH YOUR REGULAR TOOTHPASTE.  Please read over the following fact sheets that you were given. Pain Booklet, Coughing and Deep Breathing, MRSA Information and Surgical Site Infection Prevention

## 2019-05-14 ENCOUNTER — Other Ambulatory Visit: Payer: Self-pay

## 2019-05-14 ENCOUNTER — Encounter (HOSPITAL_COMMUNITY)
Admission: RE | Admit: 2019-05-14 | Discharge: 2019-05-14 | Disposition: A | Discharge status: Home / Self Care | Payer: Medicare Other | Source: Ambulatory Visit | Attending: Neurosurgery | Admitting: Neurosurgery

## 2019-05-14 ENCOUNTER — Other Ambulatory Visit (HOSPITAL_COMMUNITY)
Admission: RE | Admit: 2019-05-14 | Discharge: 2019-05-14 | Disposition: A | Discharge status: Home / Self Care | Payer: Medicare Other | Source: Ambulatory Visit | Attending: Neurosurgery | Admitting: Neurosurgery

## 2019-05-14 ENCOUNTER — Encounter (HOSPITAL_COMMUNITY): Payer: Self-pay

## 2019-05-14 DIAGNOSIS — Z885 Allergy status to narcotic agent status: Secondary | ICD-10-CM | POA: Diagnosis not present

## 2019-05-14 DIAGNOSIS — G4733 Obstructive sleep apnea (adult) (pediatric): Secondary | ICD-10-CM | POA: Diagnosis not present

## 2019-05-14 DIAGNOSIS — Z1159 Encounter for screening for other viral diseases: Secondary | ICD-10-CM | POA: Insufficient documentation

## 2019-05-14 DIAGNOSIS — E162 Hypoglycemia, unspecified: Secondary | ICD-10-CM | POA: Diagnosis not present

## 2019-05-14 DIAGNOSIS — I10 Essential (primary) hypertension: Secondary | ICD-10-CM | POA: Diagnosis not present

## 2019-05-14 DIAGNOSIS — Z79899 Other long term (current) drug therapy: Secondary | ICD-10-CM | POA: Diagnosis not present

## 2019-05-14 DIAGNOSIS — K219 Gastro-esophageal reflux disease without esophagitis: Secondary | ICD-10-CM | POA: Diagnosis not present

## 2019-05-14 DIAGNOSIS — M961 Postlaminectomy syndrome, not elsewhere classified: Secondary | ICD-10-CM | POA: Diagnosis not present

## 2019-05-14 DIAGNOSIS — K56609 Unspecified intestinal obstruction, unspecified as to partial versus complete obstruction: Secondary | ICD-10-CM | POA: Diagnosis not present

## 2019-05-14 DIAGNOSIS — Z91048 Other nonmedicinal substance allergy status: Secondary | ICD-10-CM | POA: Diagnosis not present

## 2019-05-14 DIAGNOSIS — E039 Hypothyroidism, unspecified: Secondary | ICD-10-CM | POA: Diagnosis not present

## 2019-05-14 DIAGNOSIS — M5416 Radiculopathy, lumbar region: Secondary | ICD-10-CM | POA: Diagnosis not present

## 2019-05-14 DIAGNOSIS — E1143 Type 2 diabetes mellitus with diabetic autonomic (poly)neuropathy: Secondary | ICD-10-CM | POA: Diagnosis not present

## 2019-05-14 DIAGNOSIS — E11649 Type 2 diabetes mellitus with hypoglycemia without coma: Secondary | ICD-10-CM | POA: Diagnosis not present

## 2019-05-14 DIAGNOSIS — E785 Hyperlipidemia, unspecified: Secondary | ICD-10-CM | POA: Diagnosis not present

## 2019-05-14 DIAGNOSIS — E1169 Type 2 diabetes mellitus with other specified complication: Secondary | ICD-10-CM | POA: Diagnosis not present

## 2019-05-14 DIAGNOSIS — Z01812 Encounter for preprocedural laboratory examination: Secondary | ICD-10-CM | POA: Insufficient documentation

## 2019-05-14 DIAGNOSIS — F3170 Bipolar disorder, currently in remission, most recent episode unspecified: Secondary | ICD-10-CM | POA: Diagnosis not present

## 2019-05-14 DIAGNOSIS — Z86010 Personal history of colonic polyps: Secondary | ICD-10-CM | POA: Diagnosis not present

## 2019-05-14 DIAGNOSIS — Z825 Family history of asthma and other chronic lower respiratory diseases: Secondary | ICD-10-CM | POA: Diagnosis not present

## 2019-05-14 DIAGNOSIS — M48061 Spinal stenosis, lumbar region without neurogenic claudication: Secondary | ICD-10-CM | POA: Diagnosis not present

## 2019-05-14 DIAGNOSIS — Z9884 Bariatric surgery status: Secondary | ICD-10-CM | POA: Diagnosis not present

## 2019-05-14 DIAGNOSIS — R001 Bradycardia, unspecified: Secondary | ICD-10-CM | POA: Diagnosis not present

## 2019-05-14 DIAGNOSIS — Z888 Allergy status to other drugs, medicaments and biological substances status: Secondary | ICD-10-CM | POA: Diagnosis not present

## 2019-05-14 DIAGNOSIS — E559 Vitamin D deficiency, unspecified: Secondary | ICD-10-CM | POA: Diagnosis not present

## 2019-05-14 DIAGNOSIS — G43711 Chronic migraine without aura, intractable, with status migrainosus: Secondary | ICD-10-CM | POA: Diagnosis not present

## 2019-05-14 LAB — BASIC METABOLIC PANEL
Anion gap: 10 (ref 5–15)
BUN: 22 mg/dL — ABNORMAL HIGH (ref 6–20)
CO2: 30 mmol/L (ref 22–32)
Calcium: 8.8 mg/dL — ABNORMAL LOW (ref 8.9–10.3)
Chloride: 102 mmol/L (ref 98–111)
Creatinine, Ser: 0.62 mg/dL (ref 0.44–1.00)
GFR calc Af Amer: >60 mL/min (ref >60)
GFR calc non Af Amer: >60 mL/min (ref >60)
Glucose, Bld: 80 mg/dL (ref 70–99)
Potassium: 4.4 mmol/L (ref 3.5–5.1)
Sodium: 142 mmol/L (ref 135–145)

## 2019-05-14 LAB — CBC
HCT: 40.6 % (ref 36.0–46.0)
Hemoglobin: 12.7 g/dL (ref 12.0–15.0)
MCH: 30.5 pg (ref 26.0–34.0)
MCHC: 31.3 g/dL (ref 30.0–36.0)
MCV: 97.6 fL (ref 80.0–100.0)
Platelets: 147 10*3/uL — ABNORMAL LOW (ref 150–400)
RBC: 4.16 MIL/uL (ref 3.87–5.11)
RDW: 12.8 % (ref 11.5–15.5)
WBC: 3.8 10*3/uL — ABNORMAL LOW (ref 4.0–10.5)
nRBC: 0 % (ref 0.0–0.2)

## 2019-05-14 LAB — GLUCOSE, CAPILLARY: Glucose-Capillary: 81 mg/dL (ref 70–99)

## 2019-05-14 LAB — TYPE AND SCREEN
ABO/RH(D): AB POS
Antibody Screen: NEGATIVE

## 2019-05-14 LAB — SURGICAL PCR SCREEN
MRSA, PCR: NEGATIVE
Staphylococcus aureus: NEGATIVE

## 2019-05-14 NOTE — Progress Notes (Signed)
   How to Manage Your Diabetes Before and After Surgery  Why is it important to control my blood sugar before and after surgery? . Improving blood sugar levels before and after surgery helps healing and can limit problems. . A way of improving blood sugar control is eating a healthy diet by: o  Eating less sugar and carbohydrates o  Increasing activity/exercise o  Talking with your doctor about reaching your blood sugar goals . High blood sugars (greater than 180 mg/dL) can raise your risk of infections and slow your recovery, so you will need to focus on controlling your diabetes during the weeks before surgery. . Make sure that the doctor who takes care of your diabetes knows about your planned surgery including the date and location.  How do I manage my blood sugar before surgery? . Check your blood sugar at least 4 times a day, starting 2 days before surgery, to make sure that the level is not too high or low. o Check your blood sugar the morning of your surgery when you wake up and every 2 hours until you get to the Short Stay unit. . If your blood sugar is less than 70 mg/dL, you will need to treat for low blood sugar: o Do not take insulin. o Treat a low blood sugar (less than 70 mg/dL) with  cup of clear juice (cranberry or apple), 4 glucose tablets, OR glucose gel. o Recheck blood sugar in 15 minutes after treatment (to make sure it is greater than 70 mg/dL). If your blood sugar is not greater than 70 mg/dL on recheck, call [phone] for further instructions. . Report your blood sugar to the short stay nurse when you get to Short Stay.  . If you are admitted to the hospital after surgery: o Your blood sugar will be checked by the staff and you will probably be given insulin after surgery (instead of oral diabetes medicines) to make sure you have good blood sugar levels. o The goal for blood sugar control after surgery is 80-180 mg/dL.           

## 2019-05-14 NOTE — Progress Notes (Addendum)
PCP - Dr. Ronnie Doss  Cardiologist - denies  Chest x-ray - 09/25/18 EKG - 07/22/18 Stress Test - 09/21/16 ECHO - 07/23/18 Cardiac Cath - denies  Sleep Study -OSA+ prior to bariatric surgery. Recent sleep study 05/30/18 (E), pt states she was negative.   CPAP - Has not used since bariatric surgery.   Fasting Blood Sugar - 80-100 Checks Blood Sugar __3___ times a day Last A1C: 5.5 on 02/13/19 Pt has been taken off all diabetic medications since having bariatric surgery. CBG at PAT appointment today, 58.   Blood Thinner Instructions:N/A Aspirin Instructions:LD 05/08/19  Anesthesia review: Yes, recent surgery 03/19/19, previous note from [place], [place] on 02/14/19. Pt was pre-admitted twice and then cancelled d/t COVID-19 restrictions.   Patient denies shortness of breath, fever, cough and chest pain at PAT appointment  Patient verbalized understanding of instructions that were given to them at the PAT appointment. Patient was also instructed that they will need to review over the PAT instructions again at home before surgery.  Pt getting Covid-19 screening today at drive-thru testing site.   Coronavirus Screening  Have you experienced the following symptoms:  Cough yes/no: No Fever (>100.51F)  yes/no: No Runny nose yes/no: No Sore throat yes/no: No Difficulty breathing/shortness of breath  yes/no: No  Have you or a family member traveled in the last 14 days and where? yes/no: No   If the patient indicates "YES" to the above questions, their PAT will be rescheduled to limit the exposure to others and, the surgeon will be notified. THE PATIENT WILL NEED TO BE ASYMPTOMATIC FOR 14 DAYS.   If the patient is not experiencing any of these symptoms, the PAT nurse will instruct them to NOT bring anyone with them to their appointment since they may have these symptoms or traveled as well.   Please remind your patients and families that hospital visitation restrictions are in  effect and the importance of the restrictions.

## 2019-05-15 ENCOUNTER — Ambulatory Visit (INDEPENDENT_AMBULATORY_CARE_PROVIDER_SITE_OTHER): Payer: Medicare Other | Admitting: Family Medicine

## 2019-05-15 ENCOUNTER — Encounter: Payer: Self-pay | Admitting: Family Medicine

## 2019-05-15 VITALS — Wt 138.0 lb

## 2019-05-15 DIAGNOSIS — E162 Hypoglycemia, unspecified: Secondary | ICD-10-CM

## 2019-05-15 DIAGNOSIS — K219 Gastro-esophageal reflux disease without esophagitis: Secondary | ICD-10-CM

## 2019-05-15 DIAGNOSIS — E039 Hypothyroidism, unspecified: Secondary | ICD-10-CM | POA: Diagnosis not present

## 2019-05-15 DIAGNOSIS — F3170 Bipolar disorder, currently in remission, most recent episode unspecified: Secondary | ICD-10-CM | POA: Diagnosis not present

## 2019-05-15 DIAGNOSIS — E11649 Type 2 diabetes mellitus with hypoglycemia without coma: Secondary | ICD-10-CM

## 2019-05-15 LAB — NOVEL CORONAVIRUS, NAA (HOSP ORDER, SEND-OUT TO REF LAB; TAT 18-24 HRS): SARS-CoV-2, NAA: NOT DETECTED

## 2019-05-15 MED ORDER — GLUCOSE BLOOD VI STRP: ORAL_STRIP | 3 refills | Status: DC

## 2019-05-15 MED ORDER — SERTRALINE HCL 100 MG PO TABS: 200.0000 mg | ORAL_TABLET | Freq: Every day | ORAL | 3 refills | Status: DC

## 2019-05-15 MED ORDER — LEVOTHYROXINE SODIUM 112 MCG PO TABS: ORAL_TABLET | ORAL | 1 refills | Status: DC

## 2019-05-15 MED ORDER — ZIPRASIDONE HCL 60 MG PO CAPS: 60.0000 mg | ORAL_CAPSULE | Freq: Every day | ORAL | 1 refills | Status: DC

## 2019-05-15 MED ORDER — PANTOPRAZOLE SODIUM 40 MG PO TBEC: 40.0000 mg | DELAYED_RELEASE_TABLET | Freq: Every day | ORAL | 1 refills | Status: DC

## 2019-05-15 NOTE — H&P (Signed)
Chief Complaint   Back pain  HPI   HPI: Morgan [name] is a 53 y.o. female who underwent a right-sided L4-5 laminotomy foraminotomy for decompression several months ago.  Unfortunately she only had improvement in her symptoms for about 2 weeks.  She underwent a repeat MRI which showed persistent stenosis at this level.  She was referred for an injection but unfortunately did not get any relief.  She presents today for surgical intervention via lumbar fusion.  She continues to have severe low back and right lower extremity pain.  She denies bowel or bladder dysfunction or left lower extremity symptoms.   Patient Active Problem List   Diagnosis Date Noted  . SBO (small bowel obstruction) s/p lap LOA 03/19/2019 03/19/2019  . Chronic migraine without aura, with intractable migraine, so stated, with status migrainosus 11/20/2018  . Lumbar radiculopathy 10/11/2018  . Atelectasis   . Hypoglycemia   . Bradycardia   . Epigastric pain 06/19/2018  . History of adenomatous polyp of colon 10/23/2017  . Hyperlipidemia associated with type 2 diabetes mellitus ([place]) 07/06/2017  . GERD (gastroesophageal reflux disease) 03/21/2017  . History of Roux-en-Y gastric bypass 2018 03/21/2017  . Morbid obesity with BMI of 40.0-44.9, adult ([place]) 06/06/2016  . Intractable chronic migraine without aura 06/04/2015  . Abnormal uterine bleeding 05/13/2015  . Pancreatitis 02/27/2014  . Fatty liver disease, nonalcoholic 93/57/0177  . Bipolar disorder ([place]) 02/20/2014  . Metabolic syndrome 93/90/3009  . Vitamin D deficiency   . DM (diabetes mellitus) ([place]) 03/12/2013  . Hypothyroidism   . Constipation, chronic   . Vitamin B 12 deficiency   . ALLERGIC RHINITIS 12/31/2010  . BARRETTS ESOPHAGUS 12/31/2010  . Gastroparesis 12/31/2010  . Bilateral chronic knee pain 12/31/2010  . Obstructive sleep apnea 09/27/2010  . Migraine 09/27/2010  . RESPIRATORY FAILURE, CHRONIC 09/27/2010  . Cyst of ovary 10/19/2009     PMH: Past Medical History:  Diagnosis Date  . Allergic rhinitis   . Anemia   . Anxiety   . Barrett's esophagus   . Bipolar affective disorder ([place])   . CAP (community acquired pneumonia) 07/20/2018  . Chronic respiratory failure ([place])   . Constipation, chronic   . Degenerative joint disease of spine   . Depression   . Diabetes mellitus without complication ([place])    type 2   . Dry eye   . Family history of adverse reaction to anesthesia    nausea and vomiting  . Gastroparesis   . GERD (gastroesophageal reflux disease)   . History of bariatric surgery 03/2017  . History of hiatal hernia   . HLD (hyperlipidemia) 03/12/2013  . Hyperlipidemia   . Hypertension   . Intractable chronic migraine without aura 06/04/2015  . Migraine headache   . Morbid obesity ([place])   . OSA (obstructive sleep apnea)    No cpap since gastric surgery  . Osteoarthritis    bilateral knee  . SBO (small bowel obstruction) ([place]) 03/19/2019  . Sleep apnea   . Unspecified hypothyroidism   . Vitamin B 12 deficiency   . Vitamin D deficiency     PSH: Past Surgical History:  Procedure Laterality Date  . APPENDECTOMY  1978  . CHOLECYSTECTOMY  2005  . COLONOSCOPY    . GASTRIC ROUX-EN-Y N/A 03/21/2017   Procedure: LAPAROSCOPIC ROUX-EN-Y GASTRIC, UPPER ENDO;  Surgeon: Greer Pickerel, MD;  Location: [facility];  Service: General;  Laterality: N/A;  . GASTROSTOMY N/A 06/19/2018   Procedure: LAPRASCOPIC INSERTION OF GASTROSTOMY TUBE;  Surgeon: Greer Pickerel, MD;  Location: [facility];  Service: General;  Laterality: N/A;  . GASTROSTOMY TUBE PLACEMENT Left    06/2018  . HERNIA REPAIR    . HIATAL HERNIA REPAIR N/A 06/19/2018   Procedure: LAPAROSCOPIC REPAIR OF HIATAL HERNIA;  Surgeon: Greer Pickerel, MD;  Location: [facility];  Service: General;  Laterality: N/A;  . LAPAROSCOPIC LYSIS OF ADHESIONS  03/19/2019   Dr. Romana Juniper  . LAPAROSCOPY N/A 06/19/2018   Procedure: LAPAROSCOPY DIAGNOSTIC;  Surgeon: Greer Pickerel, MD;  Location:  [facility];  Service: General;  Laterality: N/A;  . LAPAROSCOPY N/A 03/19/2019   Procedure: LAPAROSCOPY DIAGNOSTIC lysis of adhesions;  Surgeon: Clovis Riley, MD;  Location: [facility];  Service: General;  Laterality: N/A;  . LUMBAR LAMINECTOMY/DECOMPRESSION MICRODISCECTOMY Right 10/11/2018   Procedure: LAMINECTOMY AND FORAMINOTOMY RIGHT LUMBAR FOUR- LUMBAR FIVE;  Surgeon: Consuella Lose, MD;  Location: [facility];  Service: Neurosurgery;  Laterality: Right;  . SHOULDER ARTHROSCOPY  7/12   left-dsc  . TONSILLECTOMY  at age 38  . TRIGGER FINGER RELEASE  12/20/2012   Procedure: RELEASE TRIGGER FINGER/A-1 PULLEY;  Surgeon: Tennis Must, MD;  Location: [facility];  Service: Orthopedics;  Laterality: Left;  LEFT TRIGGER THUMB RELEASE    No medications prior to admission.    SH: Social History   Tobacco Use  . Smoking status: Never Smoker  . Smokeless tobacco: Never Used  Substance Use Topics  . Alcohol use: No  . Drug use: No    MEDS: Prior to Admission medications   Medication Sig Start Date End Date Taking? Authorizing Provider  Calcium-Vitamin D-Vitamin K 650-12.5-40 MG-MCG-MCG CHEW Chew 1 tablet by mouth 2 (two) times daily.   Yes [provider]  Fremanezumab-vfrm (AJOVY) 225 MG/1.5ML SOSY Inject 140 mg into the skin every 30 (thirty) days. 11/20/18  Yes Melvenia Beam, MD  gabapentin (NEURONTIN) 300 MG capsule Take 2 tabs PO in the AM, 1 tab at lunch and 2 tabs at bedtime. Patient taking differently: Take 300-600 mg by mouth See admin instructions. Take 600mg  by mouth in the morning, 300mg  at lunch and 600mg  at bedtime. 08/22/18  Yes Ward Givens, NP  Glucagon, rDNA, (GLUCAGON EMERGENCY IJ) Inject 1 Syringe as directed as needed (emergency low blood sugar).   Yes [provider]  loratadine (CLARITIN) 10 MG tablet Take 10 mg by mouth daily as needed for allergies.    Yes [provider]  Multiple Vitamin (MULTIVITAMIN) capsule Take 1 capsule  by mouth daily.    Yes [provider]  nystatin (NYAMYC) powder APPLY TOPICALLY TWICE A DAY AS NEEDED Patient taking differently: Apply 1 g topically 2 (two) times daily as needed (yeast).  07/06/17  Yes Timmothy Euler, MD  ondansetron (ZOFRAN) 4 MG tablet Take 1 tablet (4 mg total) by mouth every 8 (eight) hours. 11/20/18  Yes Melvenia Beam, MD  PAZEO 0.7 % SOLN Place 1 drop into both eyes every morning. 06/10/16  Yes [provider]  polyethylene glycol (MIRALAX / GLYCOLAX) packet Take 17 g by mouth at bedtime.    Yes [provider]  Probiotic Product (PROBIOTIC DAILY PO) Take 1 capsule by mouth daily.   Yes [provider]  RESTASIS MULTIDOSE 0.05 % ophthalmic emulsion Place 1 drop into both eyes 2 (two) times daily. 06/17/16  Yes [provider]  tiZANidine (ZANAFLEX) 2 MG tablet Take 1 tablet (2 mg total) by mouth every 8 (eight) hours as needed for  muscle spasms. 07/24/18  Yes Aline August, MD  acetaminophen (TYLENOL) 500 MG tablet Take 2 tablets (1,000 mg total) by mouth every 6 (six) hours as needed for mild pain, moderate pain, fever or headache. 03/21/19   Maczis, Barth Kirks, PA-C  divalproex (DEPAKOTE) 500 MG DR tablet TAKE 1 TABLET EVERY MORNING AND 2 TABLETS AT BEDTIME 04/01/19   Melvenia Beam, MD  docusate sodium (COLACE) 100 MG capsule Take 1 capsule (100 mg total) by mouth 2 (two) times daily. 03/21/19   Maczis, Barth Kirks, PA-C  fluticasone (FLONASE) 50 MCG/ACT nasal spray Place 2 sprays into both nostrils 2 (two) times daily as needed for allergies. 02/27/19   Ronnie Doss M, DO  glucose blood (ONE TOUCH ULTRA TEST) test strip CHECK BLOOD SUGAR FOUR TIMES A DAY.  Dx E16.2 E11.649 05/15/19   Janora Norlander, DO  levothyroxine (SYNTHROID) 112 MCG tablet TAKE 1 TABLET BEFORE BREAKFAST 05/15/19   Ronnie Doss M, DO  oxyCODONE (OXY IR/ROXICODONE) 5 MG immediate release tablet Take 1 tablet (5 mg total) by mouth every 6 (six) hours as  needed for moderate pain. 03/21/19   Maczis, Barth Kirks, PA-C  pantoprazole (PROTONIX) 40 MG tablet Take 1 tablet (40 mg total) by mouth daily. 05/15/19   Janora Norlander, DO  sertraline (ZOLOFT) 100 MG tablet Take 2 tablets (200 mg total) by mouth daily. 05/15/19   Janora Norlander, DO  ziprasidone (GEODON) 60 MG capsule Take 1 capsule (60 mg total) by mouth at bedtime. 05/15/19   Janora Norlander, DO    ALLERGY: Allergies  Allergen Reactions  . Ketoconazole Hives and Swelling    SWELLING REACTION UNSPECIFIED   . Pravachol [Pravastatin Sodium] Shortness Of Breath, Swelling and Anaphylaxis    Throat swelling  . Statins Other (See Comments)    Leg cramps  . Tape Dermatitis and Other (See Comments)    Steri-Strips  . Codeine Hypertension    increased BP    Social History   Tobacco Use  . Smoking status: Never Smoker  . Smokeless tobacco: Never Used  Substance Use Topics  . Alcohol use: No     Family History  Problem Relation Age of Onset  . Asthma Father   . Allergies Father   . Heart disease Father        enlarged heart  . Peripheral vascular disease Father   . Diabetes Father   . Hyperlipidemia Father   . Arthritis Father   . Asthma Sister   . Cancer Sister        colon at 73 yr old.  . Colon cancer Sister   . Allergies Mother   . Stroke Mother 43       with hemi paralysis  . Diabetes Mother   . Hyperlipidemia Mother   . Hypertension Mother   . GI problems Mother   . Arthritis Mother   . Allergies Brother   . Early death Brother 9       congenital abormality  . Allergies Sister   . Diabetes Sister   . Asthma Sister   . Colon polyps Sister   . Hyperlipidemia Sister   . GI problems Sister        gastroporesis   . Liver disease Sister        fatty liver  . Stroke Sister        intercrandial bleed  . Diabetes Brother   . Hypertension Brother   . Hyperlipidemia Brother   . Heart  disease Paternal Aunt   . Migraines Neg Hx      ROS   ROS  Exam    There were no vitals filed for this visit. General appearance: WDWN, NAD Eyes: No scleral injection Cardiovascular: Regular rate and rhythm without murmurs, rubs, gallops. No edema or variciosities. Distal pulses normal. Pulmonary: Effort normal, non-labored breathing Musculoskeletal:     Muscle tone upper extremities: Normal    Muscle tone lower extremities: Normal    Motor exam: Upper Extremities Deltoid Bicep Tricep Grip  Right 5/5 5/5 5/5 5/5  Left 5/5 5/5 5/5 5/5   Lower Extremity IP Quad PF DF EHL  Right 5/5 5/5 5/5 5/5 5/5  Left 5/5 5/5 5/5 5/5 5/5   Neurological Mental Status:    - Patient is awake, alert, oriented to person, place, month, year, and situation    - Patient is able to give a clear and coherent history.    - No signs of aphasia or neglect Cranial Nerves    - II: Visual Fields are full. PERRL    - III/IV/VI: EOMI without ptosis or diploplia.     - V: Facial sensation is grossly normal    - VII: Facial movement is symmetric.     - VIII: hearing is intact to voice    - X: Uvula elevates symmetrically    - XI: Shoulder shrug is symmetric.    - XII: tongue is midline without atrophy or fasciculations.  Sensory: Sensation grossly intact to LT  Results - Imaging/Labs   Results for orders placed or performed during the hospital encounter of 05/14/19 (from the past 48 hour(s))  Novel Coronavirus, NAA (hospital order; send-out to ref lab)     Status: None   Collection Time: 05/14/19  9:59 AM  Result Value Ref Range   SARS-CoV-2, NAA NOT DETECTED NOT DETECTED    Comment: (NOTE) This test was developed and its performance characteristics determined by [employer]. This test has not been FDA cleared or approved. This test has been authorized by FDA under an Emergency Use Authorization (EUA). This test is only authorized for the duration of time the declaration that circumstances exist justifying the authorization of the emergency use of in  vitro diagnostic tests for detection of SARS-CoV-2 virus and/or diagnosis of COVID-19 infection under section 564(b)(1) of the Act, 21 U.S.C. 235TDD-2(K)(0), unless the authorization is terminated or revoked sooner. When diagnostic testing is negative, the possibility of a false negative result should be considered in the context of a patient's recent exposures and the presence of clinical signs and symptoms consistent with COVID-19. An individual without symptoms of COVID-19 and who is not shedding SARS-CoV-2 virus would expect to have a negative (not detected) result in this assay. Performed  At: [facility] [place], [place] [id number] Rush Farmer MD JS:[id number]    Coronavirus Source NASOPHARYNGEAL     Comment: Performed at [facility], [place] [address], [place],  [address]    No results found.  IMAGING: MRI of the lumbar spine dated 01/16/2019 was reviewed.  This again does demonstrate right-sided broad-based disc osteophyte complex, along with some right-sided facet arthropathy and resultant continued lateral recess/foraminal stenosis.   Impression/Plan   53 y.o. female  status post right L4-5 laminotomy foraminotomy with continued low back and right leg pain.  Postoperative imaging has demonstrated continued primarily foraminal stenosis related to facet arthropathy as well as loss of disc height.  She has failed reasonable conservative treatment  including epidural steroid injections. we will plan on proceeding with more radical diskectomy, complete facetectomy, with placement of interbody devices, interbody arthrodesis, and posterior nonsegmental instrumentation at L4-5.  the risks which include but are not limited to nerve root injury leading to leg or foot weakness/numbness and/or bowel and bladder dysfunction, CSF leak, bleeding, and infection.  Possible outcomes of surgery were also discussed including the possibility of persistence or  worsening of pain symptoms and the possibility of accelerated adjacent level degeneration. The general risks of anesthesia were also reviewed including heart attack, stroke, and DVT/PE.     Ferne Reus, PA-C [facility] [employer]

## 2019-05-15 NOTE — Progress Notes (Signed)
Telephone visit  Subjective: CC: f/u hypoglycemia PCP: Janora Norlander, DO [id number] Morgan [name] is a 53 y.o. female calls for telephone consult today. Patient provides verbal consent for consult held via phone.  Location of patient: home Location of provider: Working remotely from home Others present for call: none  1. DM2/ Hypoglycemia Patient with diet-controlled type 2 diabetes after gastric bypass.  She is had some complications of hypoglycemia where she was having blood sugars in the 40s and 50s.  Over the last several months she has been gaining weight.  She had a small bowel obstruction which required surgical intervention a couple of months ago.  She notes that her blood sugars seem to be improving since that intervention and has only had a couple of blood sugars in the 50s.  She was seen by her endocrinologist who did not recommend initiation of medication at this time.  Instead, they are recommending that she eat every 2-3 hours to maintain blood sugars.  She is checking her blood sugars several times daily and is asking that we adjust her testing supplies to reflect this.  2.  Hypothyroidism Patient reports compliance with Synthroid.  No change in voice or difficulty swallowing.  No unplanned weight loss.  She is gaining weight and reports her weight to be 138 pounds today.  3.  GERD Controlled with Protonix.  Refills needed  4.  Bipolar disorder Patient reports compliance with Geodon and Zoloft.  She reports control of her symptoms.  She needs refills on both.   ROS: Per HPI  Allergies  Allergen Reactions  . Ketoconazole Hives and Swelling    SWELLING REACTION UNSPECIFIED   . Pravachol [Pravastatin Sodium] Shortness Of Breath, Swelling and Anaphylaxis    Throat swelling  . Statins Other (See Comments)    Leg cramps  . Tape Dermatitis and Other (See Comments)    Steri-Strips  . Codeine Hypertension    increased BP   Past Medical History:  Diagnosis Date  .  Allergic rhinitis   . Anemia   . Anxiety   . Barrett's esophagus   . Bipolar affective disorder ([place])   . CAP (community acquired pneumonia) 07/20/2018  . Chronic respiratory failure ([place])   . Constipation, chronic   . Degenerative joint disease of spine   . Depression   . Diabetes mellitus without complication ([place])    type 2   . Dry eye   . Family history of adverse reaction to anesthesia    nausea and vomiting  . Gastroparesis   . GERD (gastroesophageal reflux disease)   . History of bariatric surgery 03/2017  . History of hiatal hernia   . HLD (hyperlipidemia) 03/12/2013  . Hyperlipidemia   . Hypertension   . Intractable chronic migraine without aura 06/04/2015  . Migraine headache   . Morbid obesity ([place])   . OSA (obstructive sleep apnea)    No cpap since gastric surgery  . Osteoarthritis    bilateral knee  . SBO (small bowel obstruction) ([place]) 03/19/2019  . Sleep apnea   . Unspecified hypothyroidism   . Vitamin B 12 deficiency   . Vitamin D deficiency     Current Outpatient Medications:  .  acetaminophen (TYLENOL) 500 MG tablet, Take 2 tablets (1,000 mg total) by mouth every 6 (six) hours as needed for mild pain, moderate pain, fever or headache., Disp: 30 tablet, Rfl: 0 .  Calcium-Vitamin D-Vitamin K 650-12.5-40 MG-MCG-MCG CHEW, Chew 1 tablet by mouth 2 (two) times daily., Disp: ,  Rfl:  .  divalproex (DEPAKOTE) 500 MG DR tablet, TAKE 1 TABLET EVERY MORNING AND 2 TABLETS AT BEDTIME, Disp: 90 tablet, Rfl: 5 .  docusate sodium (COLACE) 100 MG capsule, Take 1 capsule (100 mg total) by mouth 2 (two) times daily., Disp: 30 capsule, Rfl: 0 .  fluticasone (FLONASE) 50 MCG/ACT nasal spray, Place 2 sprays into both nostrils 2 (two) times daily as needed for allergies., Disp: 16 g, Rfl: 4 .  Fremanezumab-vfrm (AJOVY) 225 MG/1.5ML SOSY, Inject 140 mg into the skin every 30 (thirty) days., Disp: 1 Syringe, Rfl: 11 .  gabapentin (NEURONTIN) 300 MG capsule, Take 2 tabs PO in the AM, 1 tab  at lunch and 2 tabs at bedtime. (Patient taking differently: Take 300-600 mg by mouth See admin instructions. Take 600mg  by mouth in the morning, 300mg  at lunch and 600mg  at bedtime.), Disp: 450 capsule, Rfl: 3 .  Glucagon, rDNA, (GLUCAGON EMERGENCY IJ), Inject 1 Syringe as directed as needed (emergency low blood sugar)., Disp: , Rfl:  .  glucose blood (ONE TOUCH ULTRA TEST) test strip, CHECK BLOOD SUGAR TWICE A DAY, Disp: 200 each, Rfl: 3 .  levothyroxine (SYNTHROID) 112 MCG tablet, TAKE 1 TABLET BEFORE BREAKFAST, Disp: 30 tablet, Rfl: 0 .  loratadine (CLARITIN) 10 MG tablet, Take 10 mg by mouth daily as needed for allergies. , Disp: , Rfl:  .  Multiple Vitamin (MULTIVITAMIN) capsule, Take 1 capsule by mouth daily. , Disp: , Rfl:  .  nystatin (NYAMYC) powder, APPLY TOPICALLY TWICE A DAY AS NEEDED (Patient taking differently: Apply 1 g topically 2 (two) times daily as needed (yeast). ), Disp: 30 g, Rfl: 0 .  ondansetron (ZOFRAN) 4 MG tablet, Take 1 tablet (4 mg total) by mouth every 8 (eight) hours., Disp: 90 tablet, Rfl: 11 .  oxyCODONE (OXY IR/ROXICODONE) 5 MG immediate release tablet, Take 1 tablet (5 mg total) by mouth every 6 (six) hours as needed for moderate pain., Disp: 15 tablet, Rfl: 0 .  pantoprazole (PROTONIX) 40 MG tablet, Take 1 tablet (40 mg total) by mouth daily., Disp: 90 tablet, Rfl: 1 .  PAZEO 0.7 % SOLN, Place 1 drop into both eyes every morning., Disp: , Rfl:  .  polyethylene glycol (MIRALAX / GLYCOLAX) packet, Take 17 g by mouth at bedtime. , Disp: , Rfl:  .  Probiotic Product (PROBIOTIC DAILY PO), Take 1 capsule by mouth daily., Disp: , Rfl:  .  RESTASIS MULTIDOSE 0.05 % ophthalmic emulsion, Place 1 drop into both eyes 2 (two) times daily., Disp: , Rfl:  .  sertraline (ZOLOFT) 100 MG tablet, Take 2 tablets (200 mg total) by mouth daily., Disp: 60 tablet, Rfl: 5 .  tiZANidine (ZANAFLEX) 2 MG tablet, Take 1 tablet (2 mg total) by mouth every 8 (eight) hours as needed for muscle  spasms., Disp: , Rfl:  .  ziprasidone (GEODON) 60 MG capsule, Take 1 capsule (60 mg total) by mouth at bedtime., Disp: 30 capsule, Rfl: 5  Last Weight  Most recent update: 05/15/2019  8:10 AM   Weight  62.6 kg (138 lb)           Assessment/ Plan: 53 y.o. female   1. Hypoglycemia Improving as patient is gaining weight.  I have adjusted her testing supplies to reflect a 4 times daily blood sugar check which was recommended by her endocrinologist. - glucose blood (ONE TOUCH ULTRA TEST) test strip; CHECK BLOOD SUGAR FOUR TIMES A DAY.  Dx J49.7 E11.649  Dispense: 400 each;  Refill: 3  2. Type 2 diabetes mellitus with hypoglycemia without coma, without long-term current use of insulin (HCC) As above - glucose blood (ONE TOUCH ULTRA TEST) test strip; CHECK BLOOD SUGAR FOUR TIMES A DAY.  Dx U31.4 E11.649  Dispense: 400 each; Refill: 3  3. Bipolar affective disorder in remission (HCC) Controlled - sertraline (ZOLOFT) 100 MG tablet; Take 2 tablets (200 mg total) by mouth daily.  Dispense: 180 tablet; Refill: 3 - ziprasidone (GEODON) 60 MG capsule; Take 1 capsule (60 mg total) by mouth at bedtime.  Dispense: 90 capsule; Refill: 1  4. Gastroesophageal reflux disease, esophagitis presence not specified Controlled - pantoprazole (PROTONIX) 40 MG tablet; Take 1 tablet (40 mg total) by mouth daily.  Dispense: 90 tablet; Refill: 1  5. Acquired hypothyroidism Controlled.  Last TSH was February 2020 and was within normal limits.  Plan to recheck in 3 months at her follow-up visit - levothyroxine (SYNTHROID) 112 MCG tablet; TAKE 1 TABLET BEFORE BREAKFAST  Dispense: 90 tablet; Refill: 1   Start time: 8:00am End time: 8:11am  Total time spent on patient care (including telephone call/ virtual visit): 16 minutes  [place], [place] [phone]

## 2019-05-17 ENCOUNTER — Inpatient Hospital Stay (HOSPITAL_COMMUNITY): Payer: Medicare Other | Admitting: Anesthesiology

## 2019-05-17 ENCOUNTER — Other Ambulatory Visit: Payer: Self-pay

## 2019-05-17 ENCOUNTER — Encounter (HOSPITAL_COMMUNITY): Payer: Self-pay | Admitting: *Deleted

## 2019-05-17 ENCOUNTER — Inpatient Hospital Stay (HOSPITAL_COMMUNITY)
Admission: RE | Admit: 2019-05-17 | Discharge: 2019-05-19 | DRG: 454 | Disposition: A | Discharge status: Home Health | Payer: Medicare Other | Attending: Neurosurgery | Admitting: Neurosurgery

## 2019-05-17 ENCOUNTER — Inpatient Hospital Stay (HOSPITAL_COMMUNITY): Payer: Medicare Other | Admitting: Physician Assistant

## 2019-05-17 ENCOUNTER — Inpatient Hospital Stay (HOSPITAL_COMMUNITY): Payer: Medicare Other

## 2019-05-17 ENCOUNTER — Encounter (HOSPITAL_COMMUNITY)
Admission: RE | Disposition: A | Discharge status: Home / Self Care | Payer: Self-pay | Source: Home / Self Care | Attending: Neurosurgery

## 2019-05-17 DIAGNOSIS — G43711 Chronic migraine without aura, intractable, with status migrainosus: Secondary | ICD-10-CM | POA: Diagnosis present

## 2019-05-17 DIAGNOSIS — Z419 Encounter for procedure for purposes other than remedying health state, unspecified: Secondary | ICD-10-CM

## 2019-05-17 DIAGNOSIS — E785 Hyperlipidemia, unspecified: Secondary | ICD-10-CM | POA: Diagnosis present

## 2019-05-17 DIAGNOSIS — Z9884 Bariatric surgery status: Secondary | ICD-10-CM | POA: Diagnosis not present

## 2019-05-17 DIAGNOSIS — E1169 Type 2 diabetes mellitus with other specified complication: Secondary | ICD-10-CM | POA: Diagnosis present

## 2019-05-17 DIAGNOSIS — J9601 Acute respiratory failure with hypoxia: Secondary | ICD-10-CM

## 2019-05-17 DIAGNOSIS — Z800 Family history of malignant neoplasm of digestive organs: Secondary | ICD-10-CM

## 2019-05-17 DIAGNOSIS — Z885 Allergy status to narcotic agent status: Secondary | ICD-10-CM

## 2019-05-17 DIAGNOSIS — K219 Gastro-esophageal reflux disease without esophagitis: Secondary | ICD-10-CM | POA: Diagnosis not present

## 2019-05-17 DIAGNOSIS — E559 Vitamin D deficiency, unspecified: Secondary | ICD-10-CM | POA: Diagnosis present

## 2019-05-17 DIAGNOSIS — I959 Hypotension, unspecified: Secondary | ICD-10-CM

## 2019-05-17 DIAGNOSIS — Z91048 Other nonmedicinal substance allergy status: Secondary | ICD-10-CM | POA: Diagnosis not present

## 2019-05-17 DIAGNOSIS — I10 Essential (primary) hypertension: Secondary | ICD-10-CM | POA: Diagnosis present

## 2019-05-17 DIAGNOSIS — K56609 Unspecified intestinal obstruction, unspecified as to partial versus complete obstruction: Secondary | ICD-10-CM | POA: Diagnosis not present

## 2019-05-17 DIAGNOSIS — M48061 Spinal stenosis, lumbar region without neurogenic claudication: Secondary | ICD-10-CM | POA: Diagnosis not present

## 2019-05-17 DIAGNOSIS — Z8261 Family history of arthritis: Secondary | ICD-10-CM

## 2019-05-17 DIAGNOSIS — E119 Type 2 diabetes mellitus without complications: Secondary | ICD-10-CM

## 2019-05-17 DIAGNOSIS — M961 Postlaminectomy syndrome, not elsewhere classified: Secondary | ICD-10-CM | POA: Diagnosis present

## 2019-05-17 DIAGNOSIS — E11649 Type 2 diabetes mellitus with hypoglycemia without coma: Secondary | ICD-10-CM | POA: Diagnosis present

## 2019-05-17 DIAGNOSIS — E1143 Type 2 diabetes mellitus with diabetic autonomic (poly)neuropathy: Secondary | ICD-10-CM | POA: Diagnosis present

## 2019-05-17 DIAGNOSIS — M47896 Other spondylosis, lumbar region: Secondary | ICD-10-CM | POA: Diagnosis not present

## 2019-05-17 DIAGNOSIS — G4733 Obstructive sleep apnea (adult) (pediatric): Secondary | ICD-10-CM | POA: Diagnosis present

## 2019-05-17 DIAGNOSIS — Z79899 Other long term (current) drug therapy: Secondary | ICD-10-CM | POA: Diagnosis not present

## 2019-05-17 DIAGNOSIS — Z1159 Encounter for screening for other viral diseases: Secondary | ICD-10-CM

## 2019-05-17 DIAGNOSIS — Z8249 Family history of ischemic heart disease and other diseases of the circulatory system: Secondary | ICD-10-CM

## 2019-05-17 DIAGNOSIS — Z888 Allergy status to other drugs, medicaments and biological substances status: Secondary | ICD-10-CM | POA: Diagnosis not present

## 2019-05-17 DIAGNOSIS — Z86010 Personal history of colonic polyps: Secondary | ICD-10-CM | POA: Diagnosis not present

## 2019-05-17 DIAGNOSIS — R001 Bradycardia, unspecified: Secondary | ICD-10-CM | POA: Diagnosis present

## 2019-05-17 DIAGNOSIS — Z8371 Family history of colonic polyps: Secondary | ICD-10-CM

## 2019-05-17 DIAGNOSIS — M5416 Radiculopathy, lumbar region: Secondary | ICD-10-CM | POA: Diagnosis not present

## 2019-05-17 DIAGNOSIS — Z825 Family history of asthma and other chronic lower respiratory diseases: Secondary | ICD-10-CM

## 2019-05-17 DIAGNOSIS — D696 Thrombocytopenia, unspecified: Secondary | ICD-10-CM

## 2019-05-17 DIAGNOSIS — Z79890 Hormone replacement therapy (postmenopausal): Secondary | ICD-10-CM | POA: Diagnosis not present

## 2019-05-17 DIAGNOSIS — F329 Major depressive disorder, single episode, unspecified: Secondary | ICD-10-CM | POA: Diagnosis present

## 2019-05-17 DIAGNOSIS — R410 Disorientation, unspecified: Secondary | ICD-10-CM | POA: Diagnosis not present

## 2019-05-17 DIAGNOSIS — K449 Diaphragmatic hernia without obstruction or gangrene: Secondary | ICD-10-CM | POA: Diagnosis not present

## 2019-05-17 DIAGNOSIS — Z8349 Family history of other endocrine, nutritional and metabolic diseases: Secondary | ICD-10-CM

## 2019-05-17 DIAGNOSIS — R4182 Altered mental status, unspecified: Secondary | ICD-10-CM | POA: Diagnosis present

## 2019-05-17 DIAGNOSIS — F319 Bipolar disorder, unspecified: Secondary | ICD-10-CM | POA: Diagnosis present

## 2019-05-17 DIAGNOSIS — Z833 Family history of diabetes mellitus: Secondary | ICD-10-CM

## 2019-05-17 DIAGNOSIS — M4326 Fusion of spine, lumbar region: Secondary | ICD-10-CM | POA: Diagnosis not present

## 2019-05-17 DIAGNOSIS — Z823 Family history of stroke: Secondary | ICD-10-CM

## 2019-05-17 HISTORY — PX: OTHER SURGICAL HISTORY: SHX169

## 2019-05-17 LAB — GLUCOSE, CAPILLARY
Glucose-Capillary: 88 mg/dL (ref 70–99)
Glucose-Capillary: 116 mg/dL — ABNORMAL HIGH (ref 70–99)
Glucose-Capillary: 123 mg/dL — ABNORMAL HIGH (ref 70–99)

## 2019-05-17 SURGERY — POSTERIOR LUMBAR FUSION 1 LEVEL
Anesthesia: General | Site: Spine Lumbar

## 2019-05-17 MED ORDER — ACETAMINOPHEN 650 MG RE SUPP: 650.0000 mg | RECTAL | Status: DC | PRN

## 2019-05-17 MED ORDER — LIDOCAINE-EPINEPHRINE 1 %-1:100000 IJ SOLN
INTRAMUSCULAR | Status: AC
  Filled 2019-05-17: qty 1

## 2019-05-17 MED ORDER — PROPOFOL 10 MG/ML IV BOLUS
INTRAVENOUS | Status: AC
  Filled 2019-05-17: qty 40

## 2019-05-17 MED ORDER — FENTANYL CITRATE (PF) 100 MCG/2ML IJ SOLN
INTRAMUSCULAR | Status: DC | PRN
  Administered 2019-05-17: 50 ug via INTRAVENOUS
  Administered 2019-05-17: 100 ug via INTRAVENOUS

## 2019-05-17 MED ORDER — ONDANSETRON HCL 4 MG PO TABS: 4.0000 mg | ORAL_TABLET | Freq: Four times a day (QID) | ORAL | Status: DC | PRN

## 2019-05-17 MED ORDER — DOCUSATE SODIUM 100 MG PO CAPS
100.0000 mg | ORAL_CAPSULE | Freq: Two times a day (BID) | ORAL | Status: DC
  Administered 2019-05-17 – 2019-05-19 (×4): 100 mg via ORAL
  Filled 2019-05-17 (×4): qty 1

## 2019-05-17 MED ORDER — DEXAMETHASONE SODIUM PHOSPHATE 10 MG/ML IJ SOLN
INTRAMUSCULAR | Status: AC
  Filled 2019-05-17: qty 2

## 2019-05-17 MED ORDER — SENNOSIDES-DOCUSATE SODIUM 8.6-50 MG PO TABS: 1.0000 | ORAL_TABLET | Freq: Every evening | ORAL | Status: DC | PRN

## 2019-05-17 MED ORDER — LEVOTHYROXINE SODIUM 112 MCG PO TABS
112.0000 ug | ORAL_TABLET | Freq: Every day | ORAL | Status: DC
  Administered 2019-05-18 – 2019-05-19 (×2): 112 ug via ORAL
  Filled 2019-05-17 (×2): qty 1

## 2019-05-17 MED ORDER — SODIUM CHLORIDE 0.9 % IV SOLN
INTRAVENOUS | Status: DC | PRN
  Administered 2019-05-17: 500 mL

## 2019-05-17 MED ORDER — CALCIUM CARBONATE-VITAMIN D 500-200 MG-UNIT PO TABS
1.0000 | ORAL_TABLET | Freq: Two times a day (BID) | ORAL | Status: DC
  Administered 2019-05-17 – 2019-05-19 (×4): 1 via ORAL
  Filled 2019-05-17 (×4): qty 1

## 2019-05-17 MED ORDER — SUCCINYLCHOLINE CHLORIDE 200 MG/10ML IV SOSY
PREFILLED_SYRINGE | INTRAVENOUS | Status: AC
  Filled 2019-05-17: qty 10

## 2019-05-17 MED ORDER — LIDOCAINE 2% (20 MG/ML) 5 ML SYRINGE
INTRAMUSCULAR | Status: DC | PRN
  Administered 2019-05-17: 100 mg via INTRAVENOUS

## 2019-05-17 MED ORDER — ONDANSETRON HCL 4 MG PO TABS
4.0000 mg | ORAL_TABLET | Freq: Three times a day (TID) | ORAL | Status: DC
  Administered 2019-05-17 – 2019-05-19 (×7): 4 mg via ORAL
  Filled 2019-05-17 (×7): qty 1

## 2019-05-17 MED ORDER — SUGAMMADEX SODIUM 200 MG/2ML IV SOLN
INTRAVENOUS | Status: DC | PRN
  Administered 2019-05-17: 150 mg via INTRAVENOUS

## 2019-05-17 MED ORDER — CHLORHEXIDINE GLUCONATE CLOTH 2 % EX PADS: 6.0000 | MEDICATED_PAD | Freq: Once | CUTANEOUS | Status: DC

## 2019-05-17 MED ORDER — EPHEDRINE 5 MG/ML INJ
INTRAVENOUS | Status: AC
  Filled 2019-05-17: qty 10

## 2019-05-17 MED ORDER — MIDAZOLAM HCL 2 MG/2ML IJ SOLN
INTRAMUSCULAR | Status: AC
  Filled 2019-05-17: qty 2

## 2019-05-17 MED ORDER — FENTANYL CITRATE (PF) 250 MCG/5ML IJ SOLN
INTRAMUSCULAR | Status: AC
  Filled 2019-05-17: qty 5

## 2019-05-17 MED ORDER — HYDROCODONE-ACETAMINOPHEN 5-325 MG PO TABS: 1.0000 | ORAL_TABLET | ORAL | Status: DC | PRN

## 2019-05-17 MED ORDER — ROCURONIUM BROMIDE 10 MG/ML (PF) SYRINGE
PREFILLED_SYRINGE | INTRAVENOUS | Status: AC
  Filled 2019-05-17: qty 20

## 2019-05-17 MED ORDER — SCOPOLAMINE 1 MG/3DAYS TD PT72
1.0000 | MEDICATED_PATCH | TRANSDERMAL | Status: DC
  Administered 2019-05-17: 1.5 mg via TRANSDERMAL
  Filled 2019-05-17: qty 1

## 2019-05-17 MED ORDER — SERTRALINE HCL 100 MG PO TABS
200.0000 mg | ORAL_TABLET | Freq: Every day | ORAL | Status: DC
  Administered 2019-05-18 – 2019-05-19 (×2): 200 mg via ORAL
  Filled 2019-05-17 (×2): qty 2

## 2019-05-17 MED ORDER — DIPHENHYDRAMINE HCL 50 MG/ML IJ SOLN
INTRAMUSCULAR | Status: DC | PRN
  Administered 2019-05-17: 12.5 mg via INTRAVENOUS

## 2019-05-17 MED ORDER — LIDOCAINE-EPINEPHRINE 1 %-1:100000 IJ SOLN
INTRAMUSCULAR | Status: DC | PRN
  Administered 2019-05-17: 4.5 mL

## 2019-05-17 MED ORDER — MIDAZOLAM HCL 5 MG/5ML IJ SOLN
INTRAMUSCULAR | Status: DC | PRN
  Administered 2019-05-17: 2 mg via INTRAVENOUS

## 2019-05-17 MED ORDER — PROPOFOL 10 MG/ML IV BOLUS
INTRAVENOUS | Status: DC | PRN
  Administered 2019-05-17: 200 mg via INTRAVENOUS

## 2019-05-17 MED ORDER — SODIUM CHLORIDE 0.9 % IV SOLN
INTRAVENOUS | Status: DC | PRN
  Administered 2019-05-17: 20 ug/min via INTRAVENOUS

## 2019-05-17 MED ORDER — 0.9 % SODIUM CHLORIDE (POUR BTL) OPTIME
TOPICAL | Status: DC | PRN
  Administered 2019-05-17: 1000 mL

## 2019-05-17 MED ORDER — PANTOPRAZOLE SODIUM 40 MG PO TBEC
40.0000 mg | DELAYED_RELEASE_TABLET | Freq: Every day | ORAL | Status: DC
  Administered 2019-05-18 – 2019-05-19 (×2): 40 mg via ORAL
  Filled 2019-05-17 (×3): qty 1

## 2019-05-17 MED ORDER — LIDOCAINE 2% (20 MG/ML) 5 ML SYRINGE
INTRAMUSCULAR | Status: AC
  Filled 2019-05-17: qty 10

## 2019-05-17 MED ORDER — BUPIVACAINE HCL (PF) 0.5 % IJ SOLN
INTRAMUSCULAR | Status: AC
  Filled 2019-05-17: qty 30

## 2019-05-17 MED ORDER — DIVALPROEX SODIUM 500 MG PO DR TAB
500.0000 mg | DELAYED_RELEASE_TABLET | Freq: Every day | ORAL | Status: DC
  Administered 2019-05-18 – 2019-05-19 (×2): 500 mg via ORAL
  Filled 2019-05-17 (×2): qty 1

## 2019-05-17 MED ORDER — GABAPENTIN 300 MG PO CAPS
600.0000 mg | ORAL_CAPSULE | Freq: Two times a day (BID) | ORAL | Status: DC
  Administered 2019-05-17 – 2019-05-19 (×4): 600 mg via ORAL
  Filled 2019-05-17 (×3): qty 2

## 2019-05-17 MED ORDER — SODIUM CHLORIDE 0.9% FLUSH
3.0000 mL | Freq: Two times a day (BID) | INTRAVENOUS | Status: DC
  Administered 2019-05-17 – 2019-05-18 (×2): 3 mL via INTRAVENOUS

## 2019-05-17 MED ORDER — ROCURONIUM BROMIDE 100 MG/10ML IV SOLN
INTRAVENOUS | Status: DC | PRN
  Administered 2019-05-17: 20 mg via INTRAVENOUS
  Administered 2019-05-17: 80 mg via INTRAVENOUS

## 2019-05-17 MED ORDER — CEFAZOLIN SODIUM-DEXTROSE 2-4 GM/100ML-% IV SOLN
2.0000 g | INTRAVENOUS | Status: AC
  Administered 2019-05-17: 2 g via INTRAVENOUS
  Filled 2019-05-17: qty 100

## 2019-05-17 MED ORDER — SODIUM CHLORIDE 0.9 % IV SOLN: INTRAVENOUS | Status: DC

## 2019-05-17 MED ORDER — DIVALPROEX SODIUM 500 MG PO DR TAB: 500.0000 mg | DELAYED_RELEASE_TABLET | Freq: Two times a day (BID) | ORAL | Status: DC

## 2019-05-17 MED ORDER — ONDANSETRON HCL 4 MG/2ML IJ SOLN
INTRAMUSCULAR | Status: DC | PRN
  Administered 2019-05-17: 4 mg via INTRAVENOUS

## 2019-05-17 MED ORDER — ONDANSETRON HCL 4 MG/2ML IJ SOLN: 4.0000 mg | Freq: Four times a day (QID) | INTRAMUSCULAR | Status: DC | PRN

## 2019-05-17 MED ORDER — SODIUM CHLORIDE 0.9% FLUSH: 3.0000 mL | INTRAVENOUS | Status: DC | PRN

## 2019-05-17 MED ORDER — SODIUM CHLORIDE 0.9 % IV SOLN: 250.0000 mL | INTRAVENOUS | Status: DC

## 2019-05-17 MED ORDER — OXYCODONE HCL 5 MG PO TABS
5.0000 mg | ORAL_TABLET | ORAL | Status: DC | PRN
  Administered 2019-05-17: 5 mg via ORAL
  Filled 2019-05-17: qty 1

## 2019-05-17 MED ORDER — SENNA 8.6 MG PO TABS
1.0000 | ORAL_TABLET | Freq: Two times a day (BID) | ORAL | Status: DC
  Administered 2019-05-17 – 2019-05-19 (×4): 8.6 mg via ORAL
  Filled 2019-05-17 (×4): qty 1

## 2019-05-17 MED ORDER — CEFAZOLIN SODIUM-DEXTROSE 2-4 GM/100ML-% IV SOLN
2.0000 g | Freq: Three times a day (TID) | INTRAVENOUS | Status: AC
  Administered 2019-05-17 – 2019-05-18 (×2): 2 g via INTRAVENOUS
  Filled 2019-05-17 (×2): qty 100

## 2019-05-17 MED ORDER — METHOCARBAMOL 1000 MG/10ML IJ SOLN
500.0000 mg | Freq: Four times a day (QID) | INTRAVENOUS | Status: DC | PRN
  Filled 2019-05-17: qty 5

## 2019-05-17 MED ORDER — ADULT MULTIVITAMIN W/MINERALS CH
1.0000 | ORAL_TABLET | Freq: Every day | ORAL | Status: DC
  Administered 2019-05-18 – 2019-05-19 (×2): 1 via ORAL
  Filled 2019-05-17 (×2): qty 1

## 2019-05-17 MED ORDER — CYCLOSPORINE 0.05 % OP EMUL
1.0000 [drp] | Freq: Two times a day (BID) | OPHTHALMIC | Status: DC
  Administered 2019-05-17 – 2019-05-19 (×4): 1 [drp] via OPHTHALMIC
  Filled 2019-05-17 (×5): qty 30

## 2019-05-17 MED ORDER — MENTHOL 3 MG MT LOZG: 1.0000 | LOZENGE | OROMUCOSAL | Status: DC | PRN

## 2019-05-17 MED ORDER — ACETAMINOPHEN 500 MG PO TABS
1000.0000 mg | ORAL_TABLET | Freq: Four times a day (QID) | ORAL | Status: AC
  Administered 2019-05-17 – 2019-05-18 (×4): 1000 mg via ORAL
  Filled 2019-05-17 (×4): qty 2

## 2019-05-17 MED ORDER — DEXAMETHASONE SODIUM PHOSPHATE 10 MG/ML IJ SOLN
INTRAMUSCULAR | Status: DC | PRN
  Administered 2019-05-17: 5 mg via INTRAVENOUS

## 2019-05-17 MED ORDER — HYDROMORPHONE HCL 1 MG/ML IJ SOLN
INTRAMUSCULAR | Status: AC
  Administered 2019-05-17: 11:00:00
  Filled 2019-05-17: qty 1

## 2019-05-17 MED ORDER — EPHEDRINE SULFATE-NACL 50-0.9 MG/10ML-% IV SOSY
PREFILLED_SYRINGE | INTRAVENOUS | Status: DC | PRN
  Administered 2019-05-17 (×3): 10 mg via INTRAVENOUS

## 2019-05-17 MED ORDER — HYDROMORPHONE HCL 1 MG/ML IJ SOLN
0.5000 mg | INTRAMUSCULAR | Status: DC | PRN
  Administered 2019-05-17: 1 mg via INTRAVENOUS
  Filled 2019-05-17: qty 1

## 2019-05-17 MED ORDER — ARTIFICIAL TEARS OPHTHALMIC OINT
TOPICAL_OINTMENT | OPHTHALMIC | Status: AC
  Filled 2019-05-17: qty 3.5

## 2019-05-17 MED ORDER — DIVALPROEX SODIUM 500 MG PO DR TAB
1000.0000 mg | DELAYED_RELEASE_TABLET | Freq: Every day | ORAL | Status: DC
  Administered 2019-05-17 – 2019-05-18 (×2): 1000 mg via ORAL
  Filled 2019-05-17 (×2): qty 2

## 2019-05-17 MED ORDER — PHENOL 1.4 % MT LIQD: 1.0000 | OROMUCOSAL | Status: DC | PRN

## 2019-05-17 MED ORDER — HYDROMORPHONE HCL 1 MG/ML IJ SOLN
0.2500 mg | INTRAMUSCULAR | Status: DC | PRN
  Administered 2019-05-17 (×4): 0.25 mg via INTRAVENOUS

## 2019-05-17 MED ORDER — ZIPRASIDONE HCL 40 MG PO CAPS
60.0000 mg | ORAL_CAPSULE | Freq: Every day | ORAL | Status: DC
  Administered 2019-05-17 – 2019-05-18 (×2): 60 mg via ORAL
  Filled 2019-05-17 (×3): qty 1

## 2019-05-17 MED ORDER — LACTATED RINGERS IV SOLN
INTRAVENOUS | Status: DC | PRN
  Administered 2019-05-17 (×2): via INTRAVENOUS

## 2019-05-17 MED ORDER — POLYETHYLENE GLYCOL 3350 17 G PO PACK
17.0000 g | PACK | Freq: Every day | ORAL | Status: DC
  Administered 2019-05-17 – 2019-05-18 (×2): 17 g via ORAL
  Filled 2019-05-17 (×2): qty 1

## 2019-05-17 MED ORDER — GABAPENTIN 300 MG PO CAPS
300.0000 mg | ORAL_CAPSULE | Freq: Every day | ORAL | Status: DC
  Administered 2019-05-18 – 2019-05-19 (×2): 300 mg via ORAL
  Filled 2019-05-17 (×4): qty 1

## 2019-05-17 MED ORDER — TIZANIDINE HCL 4 MG PO TABS: 2.0000 mg | ORAL_TABLET | Freq: Three times a day (TID) | ORAL | Status: DC | PRN

## 2019-05-17 MED ORDER — OLOPATADINE HCL 0.1 % OP SOLN
1.0000 [drp] | Freq: Every morning | OPHTHALMIC | Status: DC
  Administered 2019-05-18 – 2019-05-19 (×2): 1 [drp] via OPHTHALMIC
  Filled 2019-05-17: qty 5

## 2019-05-17 MED ORDER — GLUCAGON HCL RDNA (DIAGNOSTIC) 1 MG IJ SOLR
1.0000 mg | INTRAMUSCULAR | Status: DC | PRN
  Filled 2019-05-17: qty 1

## 2019-05-17 MED ORDER — ONDANSETRON HCL 4 MG/2ML IJ SOLN
INTRAMUSCULAR | Status: AC
  Filled 2019-05-17: qty 4

## 2019-05-17 MED ORDER — ACETAMINOPHEN 500 MG PO TABS
1000.0000 mg | ORAL_TABLET | Freq: Once | ORAL | Status: DC
  Filled 2019-05-17: qty 2

## 2019-05-17 MED ORDER — PROMETHAZINE HCL 25 MG/ML IJ SOLN: 6.2500 mg | INTRAMUSCULAR | Status: DC | PRN

## 2019-05-17 MED ORDER — THROMBIN 5000 UNITS EX SOLR
OROMUCOSAL | Status: DC | PRN
  Administered 2019-05-17: 5 mL via TOPICAL

## 2019-05-17 MED ORDER — BUPIVACAINE HCL (PF) 0.5 % IJ SOLN
INTRAMUSCULAR | Status: DC | PRN
  Administered 2019-05-17: 4.5 mL

## 2019-05-17 MED ORDER — METHOCARBAMOL 500 MG PO TABS: 500.0000 mg | ORAL_TABLET | Freq: Four times a day (QID) | ORAL | Status: DC | PRN

## 2019-05-17 MED ORDER — THROMBIN 5000 UNITS EX SOLR
CUTANEOUS | Status: AC
  Filled 2019-05-17: qty 5000

## 2019-05-17 MED ORDER — FREMANEZUMAB-VFRM 225 MG/1.5ML ~~LOC~~ SOSY: 140.0000 mg | PREFILLED_SYRINGE | SUBCUTANEOUS | Status: DC

## 2019-05-17 MED ORDER — FLEET ENEMA 7-19 GM/118ML RE ENEM: 1.0000 | ENEMA | Freq: Once | RECTAL | Status: DC | PRN

## 2019-05-17 MED ORDER — DIPHENHYDRAMINE HCL 50 MG/ML IJ SOLN
INTRAMUSCULAR | Status: AC
  Filled 2019-05-17: qty 2

## 2019-05-17 MED ORDER — DOCUSATE SODIUM 100 MG PO CAPS: 100.0000 mg | ORAL_CAPSULE | Freq: Two times a day (BID) | ORAL | Status: DC

## 2019-05-17 MED ORDER — GABAPENTIN 300 MG PO CAPS: 300.0000 mg | ORAL_CAPSULE | Freq: Three times a day (TID) | ORAL | Status: DC

## 2019-05-17 MED ORDER — PHENYLEPHRINE 40 MCG/ML (10ML) SYRINGE FOR IV PUSH (FOR BLOOD PRESSURE SUPPORT)
PREFILLED_SYRINGE | INTRAVENOUS | Status: AC
  Filled 2019-05-17: qty 10

## 2019-05-17 MED ORDER — KETAMINE HCL 50 MG/5ML IJ SOSY
PREFILLED_SYRINGE | INTRAMUSCULAR | Status: AC
  Filled 2019-05-17: qty 10

## 2019-05-17 MED ORDER — KETAMINE HCL 10 MG/ML IJ SOLN
INTRAMUSCULAR | Status: DC | PRN
  Administered 2019-05-17: 30 mg via INTRAVENOUS
  Administered 2019-05-17: 10 mg via INTRAVENOUS

## 2019-05-17 MED ORDER — BISACODYL 10 MG RE SUPP: 10.0000 mg | Freq: Every day | RECTAL | Status: DC | PRN

## 2019-05-17 MED ORDER — ACETAMINOPHEN 325 MG PO TABS
650.0000 mg | ORAL_TABLET | ORAL | Status: DC | PRN
  Administered 2019-05-18 – 2019-05-19 (×4): 650 mg via ORAL
  Filled 2019-05-17 (×4): qty 2

## 2019-05-17 MED ORDER — RISAQUAD PO CAPS
1.0000 | ORAL_CAPSULE | Freq: Every day | ORAL | Status: DC
  Administered 2019-05-17 – 2019-05-19 (×3): 1 via ORAL
  Filled 2019-05-17 (×3): qty 1

## 2019-05-17 SURGICAL SUPPLY — 80 items
ADH SKN CLS APL DERMABOND .7 (GAUZE/BANDAGES/DRESSINGS) ×1
APL SKNCLS STERI-STRIP NONHPOA (GAUZE/BANDAGES/DRESSINGS)
BAG DECANTER FOR FLEXI CONT (MISCELLANEOUS) ×3 IMPLANT
BASKET BONE COLLECTION (BASKET) ×3 IMPLANT
BENZOIN TINCTURE PRP APPL 2/3 (GAUZE/BANDAGES/DRESSINGS) IMPLANT
BIT DRILL 3.5 POWEREASE (BIT) ×1 IMPLANT
BIT DRILL 3.5MM POWEREASE (BIT) ×1
BLADE CLIPPER SURG (BLADE) IMPLANT
BLADE SURG 11 STRL SS (BLADE) ×3 IMPLANT
BUR MATCHSTICK NEURO 3.0 LAGG (BURR) ×3 IMPLANT
BUR PRECISION FLUTE 5.0 (BURR) ×3 IMPLANT
CANISTER SUCT 3000ML PPV (MISCELLANEOUS) ×3 IMPLANT
CARTRIDGE OIL MAESTRO DRILL (MISCELLANEOUS) ×1 IMPLANT
CLOSURE WOUND 1/2 X4 (GAUZE/BANDAGES/DRESSINGS)
CONT SPEC 4OZ CLIKSEAL STRL BL (MISCELLANEOUS) ×3 IMPLANT
COVER BACK TABLE 60X90IN (DRAPES) ×3 IMPLANT
COVER WAND RF STERILE (DRAPES) ×1 IMPLANT
DECANTER SPIKE VIAL GLASS SM (MISCELLANEOUS) ×3 IMPLANT
DERMABOND ADVANCED (GAUZE/BANDAGES/DRESSINGS) ×2
DERMABOND ADVANCED .7 DNX12 (GAUZE/BANDAGES/DRESSINGS) ×1 IMPLANT
DEVICE INTERBODY ELEVATE 23X7 (Cage) ×2 IMPLANT
DIFFUSER DRILL AIR PNEUMATIC (MISCELLANEOUS) ×3 IMPLANT
DRAPE C-ARM 42X72 X-RAY (DRAPES) ×3 IMPLANT
DRAPE C-ARMOR (DRAPES) ×3 IMPLANT
DRAPE LAPAROTOMY 100X72X124 (DRAPES) ×3 IMPLANT
DRAPE SURG 17X23 STRL (DRAPES) ×5 IMPLANT
DRSG OPSITE POSTOP 4X6 (GAUZE/BANDAGES/DRESSINGS) ×2 IMPLANT
DURAPREP 26ML APPLICATOR (WOUND CARE) ×3 IMPLANT
ELECT REM PT RETURN 9FT ADLT (ELECTROSURGICAL) ×3
ELECTRODE REM PT RTRN 9FT ADLT (ELECTROSURGICAL) ×1 IMPLANT
GAUZE 4X4 16PLY RFD (DISPOSABLE) IMPLANT
GAUZE SPONGE 4X4 12PLY STRL (GAUZE/BANDAGES/DRESSINGS) IMPLANT
GLOVE BIO SURGEON STRL SZ 6.5 (GLOVE) ×4 IMPLANT
GLOVE BIO SURGEON STRL SZ7 (GLOVE) ×4 IMPLANT
GLOVE BIO SURGEON STRL SZ7.5 (GLOVE) ×8 IMPLANT
GLOVE BIO SURGEONS STRL SZ 6.5 (GLOVE) ×4
GLOVE BIOGEL PI IND STRL 6.5 (GLOVE) IMPLANT
GLOVE BIOGEL PI IND STRL 7.5 (GLOVE) ×2 IMPLANT
GLOVE BIOGEL PI INDICATOR 6.5 (GLOVE) ×2
GLOVE BIOGEL PI INDICATOR 7.5 (GLOVE) ×8
GLOVE ECLIPSE 7.0 STRL STRAW (GLOVE) ×6 IMPLANT
GLOVE EXAM NITRILE XL STR (GLOVE) IMPLANT
GOWN STRL REUS W/ TWL LRG LVL3 (GOWN DISPOSABLE) ×4 IMPLANT
GOWN STRL REUS W/ TWL XL LVL3 (GOWN DISPOSABLE) IMPLANT
GOWN STRL REUS W/TWL 2XL LVL3 (GOWN DISPOSABLE) IMPLANT
GOWN STRL REUS W/TWL LRG LVL3 (GOWN DISPOSABLE) ×18
GOWN STRL REUS W/TWL XL LVL3 (GOWN DISPOSABLE)
GRAFT BN 5X1XSPNE CVD POST DBM (Bone Implant) IMPLANT
GRAFT BONE MAGNIFUSE 1X5CM (Bone Implant) ×3 IMPLANT
HEMOSTAT POWDER KIT SURGIFOAM (HEMOSTASIS) ×3 IMPLANT
KIT BASIN OR (CUSTOM PROCEDURE TRAY) ×3 IMPLANT
KIT INFUSE XX SMALL 0.7CC (Orthopedic Implant) ×2 IMPLANT
KIT POSITION SURG JACKSON T1 (MISCELLANEOUS) ×3 IMPLANT
KIT TURNOVER KIT B (KITS) ×3 IMPLANT
MILL MEDIUM DISP (BLADE) ×3 IMPLANT
NDL HYPO 18GX1.5 BLUNT FILL (NEEDLE) IMPLANT
NDL SPNL 18GX3.5 QUINCKE PK (NEEDLE) IMPLANT
NEEDLE HYPO 18GX1.5 BLUNT FILL (NEEDLE) IMPLANT
NEEDLE HYPO 22GX1.5 SAFETY (NEEDLE) ×3 IMPLANT
NEEDLE SPNL 18GX3.5 QUINCKE PK (NEEDLE) ×3 IMPLANT
NS IRRIG 1000ML POUR BTL (IV SOLUTION) ×3 IMPLANT
OIL CARTRIDGE MAESTRO DRILL (MISCELLANEOUS) ×3
PACK LAMINECTOMY NEURO (CUSTOM PROCEDURE TRAY) ×3 IMPLANT
PAD ARMBOARD 7.5X6 YLW CONV (MISCELLANEOUS) ×9 IMPLANT
ROD COBALT 47.5X35 (Rod) ×4 IMPLANT
SCREW 5.5X35MM (Screw) ×12 IMPLANT
SCREW BN 35X5.5XMA NS SPNE (Screw) IMPLANT
SCREW SET SOLERA (Screw) ×12 IMPLANT
SCREW SET SOLERA TI (Screw) IMPLANT
SPONGE LAP 4X18 RFD (DISPOSABLE) IMPLANT
SPONGE SURGIFOAM ABS GEL 100 (HEMOSTASIS) IMPLANT
STRIP CLOSURE SKIN 1/2X4 (GAUZE/BANDAGES/DRESSINGS) IMPLANT
SUT VIC AB 0 CT1 18XCR BRD8 (SUTURE) ×1 IMPLANT
SUT VIC AB 0 CT1 8-18 (SUTURE) ×6
SUT VICRYL 3-0 RB1 18 ABS (SUTURE) ×5 IMPLANT
SYR 3ML LL SCALE MARK (SYRINGE) ×11 IMPLANT
TOWEL GREEN STERILE (TOWEL DISPOSABLE) ×3 IMPLANT
TOWEL GREEN STERILE FF (TOWEL DISPOSABLE) ×3 IMPLANT
TRAY FOLEY MTR SLVR 16FR STAT (SET/KITS/TRAYS/PACK) ×3 IMPLANT
WATER STERILE IRR 1000ML POUR (IV SOLUTION) ×3 IMPLANT

## 2019-05-17 NOTE — Op Note (Addendum)
NEUROSURGERY OPERATIVE NOTE   PREOP DIAGNOSIS:  1. Lumbar stenosis, Right L4-5 2. Post-laminectomy syndrome  POSTOP DIAGNOSIS: Same  PROCEDURE: 1. Right L4-5 laminectomy with facetectomy for decompression of exiting nerve roots, more than would be required for placement of interbody graft 2. Placement of anterior interbody device - Medtronic 62mm expandable cage x1 3. Posterior non-segmental instrumentation using cortical pedicle screws at L4 - L5 4. Interbody arthrodesis, L4-5 5. Posterolateral arthrodesis, L4-5 6. Use of locally harvested bone autograft 7. Use of non-structural bone allograft - BMP, Magnifuse  SURGEON: Dr. Consuella Lose, MD  ASSISTANT: Ferne Reus, PA-C  ANESTHESIA: General Endotracheal  EBL: 200cc  SPECIMENS: None  DRAINS: None  COMPLICATIONS: None immediate  CONDITION: Hemodynamically stable to PACU  HISTORY: Morgan [name] is a 53 y.o. female who has been followed in the outpatient clinic with right-sided back and leg pain.  She initially underwent right L4-5 laminotomy and microdiscectomy.  Although she did have approximately 2 weeks of good pain relief, she had recurrence of her right-sided leg pain.  Repeat MRI demonstrated continued stenosis on the right at L4-5 in the foramen due to facet arthropathy and lateral disc bulge.  After reasonable conservative treatment she continued to have leg pain and elected to proceed with decompression and fusion.  The risks and benefits of the surgery were reviewed in detail with the patient and her family.  After all questions were answered informed consent was obtained and witnessed.   PROCEDURE IN DETAIL: The patient was brought to the operating room via stretcher. After induction of general anesthesia, the patient was positioned on the operative table in the prone position. All pressure points were meticulously padded.  Previous skin incision was then marked out and prepped and draped in the usual  sterile fashion.  After timeout was conducted, the skin was infiltrated with local anesthetic.  Previous skin incision was then opened sharply and Bovie electrocautery was used to dissect the subcutaneous tissue until the lumbodorsal fascia was identified and incised. The muscle was then elevated in the subperiosteal plane and the L4 and L5 lamina and the intervening L4-5 facet complexes were identified. Self-retaining retractors were then placed.  Dissector was placed in the interspace, and intraoperative fluoroscopy confirmed our location at the top of the L4 lamina.  At this point attention was turned to decompression.  As there was no significant left-sided or central stenosis, I elected to proceed with right-sided decompression.  Complete right L4 laminectomy was completed using high-speed drill and Kerrison punches.  The lateral edge of the thecal sac was identified.  A small ball-tipped dissector was used to identify the right L4-5 foramen.  High-speed drill was then used to cut across the pars interarticularis on the right side.  The inferior articulating process of L4 on the right was then removed.  Using Kerrison punches, I was able to identify and dissect away the lateral edge of the thecal sac and the traversing right L5 nerve root away from the medial wall of the right L5 pedicle.  The medial and superior portion of the superior articulating process of L5 was then removed.  I was then able to identify the exiting right L4 nerve root.  This was traced out to the lateral aspect of the L5 pedicle and was therefore decompressed completely.  Disc space on the right side at L4-5 was then identified, incised, and using a combination of shavers, curettes and rongeurs, complete discectomy was completed. Endplates were prepared with rasps, and bone harvested  during decompression was mixed with BMP and packed into the interspace. A 7 mm expandable cage was tapped into place utilizing a lateral to medial  trajectory in order to place the graft in the midline.  Good position was confirmed with fluoroscopy.  At this point, attention was turned to placement of the L4 and L5 cortical pedicle screws.  Using standard anatomic landmarks and the assistance of lateral fluoroscopy, entry points were identified.  These were then drilled and tapped to 5.5 x 35 mm.  Screws were then placed in L4 and L5.pre-bent lordotic rod was then placed, set screws placed and final tightened. Final AP and lateral fluoroscopic images confirmed good position.  At this point on the left side, the exposed L4 lamina, L4-5 facet complex, and L5 lamina were all decorticated with a high-speed drill.  BMP, autograft, and magna fuse were then laid over the posterior lateral bone surface in order to promote posterolateral arthrodesis.  The wound was then irrigated with copious amounts of antibiotic saline, then closed in standard fashion using a combination of interrupted 0 and 3-0 Vicryl stitches in the muscular, fascial, and subcutaneous layers. Skin was then closed using standard Dermabond. Sterile dressing was then applied. The patient was then transferred to the stretcher, extubated, and taken to the postanesthesia care unit in stable hemodynamic condition.  At the end of the case all sponge, needle, cottonoid, and instrument counts were correct.

## 2019-05-17 NOTE — Progress Notes (Signed)
Physical Therapy Evaluation Patient Details Name: Morgan [name] MRN: [id number] DOB: 1966-02-27 Today's Date: 05/17/2019   History of Present Illness  Pt is a 53 y/o female s/p L4-5 PLIF. PMH includes bipolar disorder, CAP, DM, HTN, and OSA.   Clinical Impression  Patient is s/p above surgery resulting in the deficits listed below (see PT Problem List). Pt with mild unsteadiness requiring min to min guard A for mobility. Reviewed back precautions and generalized walking program. Patient will benefit from skilled PT to increase their independence and safety with mobility (while adhering to their precautions) to allow discharge to the venue listed below.     Follow Up Recommendations No PT follow up;Supervision for mobility/OOB    Equipment Recommendations  None recommended by PT    Recommendations for Other Services       Precautions / Restrictions Precautions Precautions: Back Precaution Booklet Issued: Yes (comment) Precaution Comments: Reviewed back precautions with pt.  Required Braces or Orthoses: Spinal Brace Spinal Brace: Lumbar corset;Applied in sitting position Restrictions Weight Bearing Restrictions: No      Mobility  Bed Mobility Overal bed mobility: Needs Assistance Bed Mobility: Rolling;Sidelying to Sit;Sit to Sidelying Rolling: Supervision Sidelying to sit: Supervision     Sit to sidelying: Supervision General bed mobility comments: Supervision for safety and to ensure log roll technique. Cues for sequencing.   Transfers Overall transfer level: Needs assistance Equipment used: None Transfers: Sit to/from Stand Sit to Stand: Min assist         General transfer comment: Min A for steadying assist.   Ambulation/Gait Ambulation/Gait assistance: Min guard Gait Distance (Feet): 50 Feet Assistive device: None Gait Pattern/deviations: Step-through pattern;Decreased stride length Gait velocity: Decreased    General Gait Details: Slow, guarded gait.  Mild unsteadiness noted, however, no overt LOB. Educated about generalized walking program to perform at home.   Stairs            Wheelchair Mobility    Modified Rankin (Stroke Patients Only)       Balance Overall balance assessment: Needs assistance Sitting-balance support: No upper extremity supported;Feet supported Sitting balance-Leahy Scale: Good     Standing balance support: No upper extremity supported;During functional activity Standing balance-Leahy Scale: Fair                               Pertinent Vitals/Pain Pain Assessment: Faces Faces Pain Scale: Hurts even more Pain Location: back Pain Descriptors / Indicators: Aching;Operative site guarding Pain Intervention(s): Limited activity within patient's tolerance;Monitored during session;Repositioned    Home Living Family/patient expects to be discharged to:: Private residence Living Arrangements: Other relatives(niece) Available Help at Discharge: Family;Available 24 hours/day Type of Home: Mobile home Home Access: Ramped entrance     Home Layout: One level Home Equipment: [place] - 2 wheels;Cane - single point;Bedside commode;Shower seat      Prior Function Level of Independence: Independent               Hand Dominance        Extremity/Trunk Assessment   Upper Extremity Assessment Upper Extremity Assessment: Defer to OT evaluation    Lower Extremity Assessment Lower Extremity Assessment: Generalized weakness    Cervical / Trunk Assessment Cervical / Trunk Assessment: Other exceptions Cervical / Trunk Exceptions: s/p PLIF   Communication   Communication: No difficulties  Cognition Arousal/Alertness: Awake/alert Behavior During Therapy: WFL for tasks assessed/performed Overall Cognitive Status: Within Functional Limits for tasks assessed  General Comments      Exercises     Assessment/Plan    PT  Assessment Patient needs continued PT services  PT Problem List Decreased strength;Decreased mobility;Decreased knowledge of use of DME;Decreased knowledge of precautions;Pain       PT Treatment Interventions Gait training;Functional mobility training;Therapeutic activities;Therapeutic exercise;Balance training;Patient/family education    PT Goals (Current goals can be found in the Care Plan section)  Acute Rehab PT Goals Patient Stated Goal: to go home tomorrow PT Goal Formulation: With patient Time For Goal Achievement: 05/31/19 Potential to Achieve Goals: Good    Frequency Min 5X/week   Barriers to discharge        Co-evaluation               AM-PAC PT "6 Clicks" Mobility  Outcome Measure Help needed turning from your back to your side while in a flat bed without using bedrails?: A Little Help needed moving from lying on your back to sitting on the side of a flat bed without using bedrails?: A Little Help needed moving to and from a bed to a chair (including a wheelchair)?: A Little Help needed standing up from a chair using your arms (e.g., wheelchair or bedside chair)?: A Little Help needed to walk in hospital room?: A Little Help needed climbing 3-5 steps with a railing? : A Lot 6 Click Score: 17    End of Session Equipment Utilized During Treatment: Gait belt;Back brace Activity Tolerance: Patient tolerated treatment well Patient left: in bed;with call bell/phone within reach Nurse Communication: Mobility status;Patient requests pain meds PT Visit Diagnosis: Other abnormalities of gait and mobility (R26.89);Pain Pain - part of body: (back)    Time: 2229-7989 PT Time Calculation (min) (ACUTE ONLY): 13 min   Charges:   PT Evaluation $PT Eval Low Complexity: [place], PT, DPT  Acute Rehabilitation Services  Pager: [phone] Office: [phone]   Rudean Hitt 05/17/2019, 3:31 PM

## 2019-05-17 NOTE — Progress Notes (Signed)
While doing reassessment of patient noticed her dressing on her back had copious drainage. MD paged waiting on return call. Pt sitting up and is A&O x4. Will continue to monitor pt.

## 2019-05-17 NOTE — Addendum Note (Signed)
Addendum  created 05/17/19 1502 by Candis Shine, CRNA   Charge Capture section accepted

## 2019-05-17 NOTE — Anesthesia Postprocedure Evaluation (Signed)
Anesthesia Post Note  Patient: Morgan [name]  Procedure(s) Performed: Posterior Lumbar Interbody Fusion - Interbody Fusion - Lumbar Four-Five, biomechanical device, posterior non-segmental instrumentation, BMP (N/A Spine Lumbar)     Patient location during evaluation: PACU Anesthesia Type: General Level of consciousness: sedated Pain management: pain level controlled Vital Signs Assessment: post-procedure vital signs reviewed and stable Respiratory status: spontaneous breathing and respiratory function stable Cardiovascular status: stable Postop Assessment: no apparent nausea or vomiting Anesthetic complications: no    Last Vitals:  Vitals:   05/17/19 1220 05/17/19 1246  BP: 112/70 109/67  Pulse: 74 74  Resp: 18 16  Temp:  36.8 C  SpO2: 96% 99%    Last Pain:  Vitals:   05/17/19 1246  TempSrc: Oral  PainSc:                  Bryer Gottsch DANIEL

## 2019-05-17 NOTE — Transfer of Care (Signed)
Immediate Anesthesia Transfer of Care Note  Patient: Morgan [name]  Procedure(s) Performed: Posterior Lumbar Interbody Fusion - Interbody Fusion - Lumbar Four-Five, biomechanical device, posterior non-segmental instrumentation, BMP (N/A Spine Lumbar)  Patient Location: PACU  Anesthesia Type:General  Level of Consciousness: awake, patient cooperative and responds to stimulation  Airway & Oxygen Therapy: Patient Spontanous Breathing and Patient connected to nasal cannula oxygen  Post-op Assessment: Report given to RN and Post -op Vital signs reviewed and stable  Post vital signs: Reviewed and stable  Last Vitals:  Vitals Value Taken Time  BP 120/71 05/17/2019 11:05 AM  Temp 36.1 C 05/17/2019 11:05 AM  Pulse 79 05/17/2019 11:06 AM  Resp 32 05/17/2019 11:06 AM  SpO2 100 % 05/17/2019 11:06 AM  Vitals shown include unvalidated device data.  Last Pain:  Vitals:   05/17/19 0608  TempSrc:   PainSc: 6       Patients Stated Pain Goal: 3 (84/66/59 9357)  Complications: No apparent anesthesia complications

## 2019-05-17 NOTE — Anesthesia Procedure Notes (Addendum)
Procedure Name: Intubation Date/Time: 05/17/2019 7:59 AM Performed by: Candis Shine, CRNA Pre-anesthesia Checklist: Patient identified, Emergency Drugs available, Suction available and Patient being monitored Patient Re-evaluated:Patient Re-evaluated prior to induction Oxygen Delivery Method: Circle System Utilized Preoxygenation: Pre-oxygenation with 100% oxygen Induction Type: IV induction Ventilation: Mask ventilation without difficulty and Oral airway inserted - appropriate to patient size Laryngoscope Size: Mac and 3 Grade View: Grade I Tube type: Oral Tube size: 7.0 mm Number of attempts: 1 Airway Equipment and Method: Stylet and Oral airway Placement Confirmation: ETT inserted through vocal cords under direct vision,  positive ETCO2 and breath sounds checked- equal and bilateral Secured at: 22 cm Tube secured with: Tape Dental Injury: Teeth and Oropharynx as per pre-operative assessment

## 2019-05-18 LAB — CBC
HCT: 31.2 % — ABNORMAL LOW (ref 36.0–46.0)
Hemoglobin: 10.2 g/dL — ABNORMAL LOW (ref 12.0–15.0)
MCH: 30.5 pg (ref 26.0–34.0)
MCHC: 32.7 g/dL (ref 30.0–36.0)
MCV: 93.4 fL (ref 80.0–100.0)
Platelets: 92 10*3/uL — ABNORMAL LOW (ref 150–400)
RBC: 3.34 MIL/uL — ABNORMAL LOW (ref 3.87–5.11)
RDW: 12.7 % (ref 11.5–15.5)
WBC: 2.3 10*3/uL — ABNORMAL LOW (ref 4.0–10.5)
nRBC: 0 % (ref 0.0–0.2)

## 2019-05-18 LAB — BASIC METABOLIC PANEL
Anion gap: 10 (ref 5–15)
BUN: 11 mg/dL (ref 6–20)
CO2: 27 mmol/L (ref 22–32)
Calcium: 8.4 mg/dL — ABNORMAL LOW (ref 8.9–10.3)
Chloride: 104 mmol/L (ref 98–111)
Creatinine, Ser: 0.58 mg/dL (ref 0.44–1.00)
GFR calc Af Amer: >60 mL/min (ref >60)
GFR calc non Af Amer: >60 mL/min (ref >60)
Glucose, Bld: 76 mg/dL (ref 70–99)
Potassium: 3.8 mmol/L (ref 3.5–5.1)
Sodium: 141 mmol/L (ref 135–145)

## 2019-05-18 LAB — APTT: aPTT: 26 seconds (ref 24–36)

## 2019-05-18 LAB — PROTIME-INR
INR: 1 (ref 0.8–1.2)
Prothrombin Time: 13.4 seconds (ref 11.4–15.2)

## 2019-05-18 MED ORDER — SODIUM CHLORIDE 0.9 % IV SOLN
INTRAVENOUS | Status: DC
  Administered 2019-05-18: 06:00:00 via INTRAVENOUS

## 2019-05-18 MED ORDER — KETOROLAC TROMETHAMINE 30 MG/ML IJ SOLN
30.0000 mg | Freq: Once | INTRAMUSCULAR | Status: AC
  Administered 2019-05-18: 30 mg via INTRAVENOUS
  Filled 2019-05-18: qty 1

## 2019-05-18 NOTE — Evaluation (Signed)
Occupational Therapy Evaluation Patient Details Name: Morgan [name] MRN: [id number] DOB: April 09, 1966 Today's Date: 05/18/2019    History of Present Illness Pt is a 53 y/o female s/p L4-5 PLIF. PMH includes bipolar disorder, CAP, DM, HTN, and OSA.    Clinical Impression   Patient is s/p  L4-5 PLIF surgery resulting in the deficits listed below (see OT Problem List). The patient was seen with PT due to BP. Please see bellow.  The patient at this time is able to voice understanding of precautions but requires cues to follow in session. Patient is able to don/doff brace post set up. Patient requires min assist with BLE dressing and bathing. Patient requires min guard with FW due to BP. Patient will benefit from skilled OT to increase their safety and independence with ADL and functional mobility for ADL (while adhering to their precautions) to facilitate discharge to venue listed below.   Supine: 87/63 HR 59 o2 99 1 min sitting: 105/66 HR 59 o2 : 99 Standing: 104/69 HR 69 o2 98 1 min standing: 105/74  Post ambulation in sitting:  99/69 HR 92 o2 99      Follow Up Recommendations  Home health OT;Supervision/Assistance - 24 hour    Equipment Recommendations       Recommendations for Other Services       Precautions / Restrictions Precautions Precautions: Back Precaution Booklet Issued: No Precaution Comments: Reviewed back precautions with pt.  Required Braces or Orthoses: Spinal Brace Spinal Brace: Lumbar corset;Applied in sitting position Restrictions Weight Bearing Restrictions: No      Mobility Bed Mobility Overal bed mobility: Needs Assistance Bed Mobility: Rolling;Sidelying to Sit;Sit to Sidelying Rolling: Modified independent (Device/Increase time) Sidelying to sit: Modified independent (Device/Increase time)     Sit to sidelying: Modified independent (Device/Increase time)    Transfers Overall transfer level: Needs assistance Equipment used: Rolling walker (2  wheeled) Transfers: Sit to/from Stand Sit to Stand: Min guard              Balance Overall balance assessment: Needs assistance Sitting-balance support: No upper extremity supported;Feet supported Sitting balance-Leahy Scale: Good     Standing balance support: Bilateral upper extremity supported Standing balance-Leahy Scale: Fair                             ADL either performed or assessed with clinical judgement   ADL Overall ADL's : Needs assistance/impaired Eating/Feeding: Modified independent;Sitting   Grooming: Wash/dry hands;Wash/dry face;Min guard;Standing;Cueing for sequencing;Cueing for safety   Upper Body Bathing: Min guard;Sitting   Lower Body Bathing: Minimal assistance;Cueing for safety;Cueing for sequencing;Sit to/from stand   Upper Body Dressing : Min guard;Sitting   Lower Body Dressing: Minimal assistance;Cueing for back precautions;Sit to/from stand   Toilet Transfer: Min guard;Regular Toilet;Grab bars   Toileting- Water quality scientist and Hygiene: Min guard;Cueing for back precautions   Tub/ Shower Transfer: Walk-in shower;Min guard;Shower seat   Functional mobility during ADLs: Min guard;Minimal assistance       Vision   Vision Assessment?: No apparent visual deficits     Perception Perception Perception Tested?: No   Praxis Praxis Praxis tested?: Not tested    Pertinent Vitals/Pain Pain Assessment: 0-10 Pain Score: 8  Pain Location: back Pain Descriptors / Indicators: Aching;Operative site guarding Pain Intervention(s): Limited activity within patient's tolerance;Monitored during session;Repositioned     Hand Dominance Right   Extremity/Trunk Assessment Upper Extremity Assessment Upper Extremity Assessment: Overall WFL for tasks assessed  Lower Extremity Assessment Lower Extremity Assessment: Defer to PT evaluation   Cervical / Trunk Assessment Cervical / Trunk Assessment: Other exceptions Cervical / Trunk  Exceptions: s/p PLIF    Communication Communication Communication: No difficulties   Cognition Arousal/Alertness: Awake/alert Behavior During Therapy: WFL for tasks assessed/performed Overall Cognitive Status: Within Functional Limits for tasks assessed                                     General Comments       Exercises     Shoulder Instructions      Home Living Family/patient expects to be discharged to:: Private residence Living Arrangements: Other relatives Available Help at Discharge: Family;Available 24 hours/day Type of Home: Mobile home Home Access: Ramped entrance     Home Layout: One level     Bathroom Shower/Tub: Walk-in shower;Tub/shower unit   Bathroom Toilet: Standard Bathroom Accessibility: Yes How Accessible: Accessible via walker Home Equipment: [place] - 2 wheels;Cane - single point;Bedside commode;Shower seat          Prior Functioning/Environment Level of Independence: Independent        Comments: was driving, not working (on disability)        OT Problem List: Decreased strength;Decreased range of motion;Decreased activity tolerance;Decreased safety awareness;Decreased knowledge of use of DME or AE;Pain      OT Treatment/Interventions: Self-care/ADL training;Therapeutic exercise;DME and/or AE instruction;Therapeutic activities;Patient/family education;Balance training    OT Goals(Current goals can be found in the care plan section) Acute Rehab OT Goals Patient Stated Goal: to go home soon and decrease pain OT Goal Formulation: With patient Time For Goal Achievement: 05/25/19 Potential to Achieve Goals: Good ADL Goals Pt Will Perform Lower Body Bathing: with modified independence Pt Will Perform Lower Body Dressing: with modified independence;with adaptive equipment;sit to/from stand Pt Will Transfer to Toilet: with modified independence;regular height toilet  OT Frequency: Min 2X/week   Barriers to D/C:             Co-evaluation PT/OT/SLP Co-Evaluation/Treatment: Yes Reason for Co-Treatment: For patient/therapist safety(low BP) PT goals addressed during session: Mobility/safety with mobility OT goals addressed during session: ADL's and self-care      AM-PAC OT "6 Clicks" Daily Activity     Outcome Measure Help from another person eating meals?: None Help from another person taking care of personal grooming?: None Help from another person toileting, which includes using toliet, bedpan, or urinal?: A Little Help from another person bathing (including washing, rinsing, drying)?: A Little Help from another person to put on and taking off regular upper body clothing?: None Help from another person to put on and taking off regular lower body clothing?: A Little 6 Click Score: 21   End of Session Equipment Utilized During Treatment: Gait belt;Rolling walker;Back brace  Activity Tolerance: Patient limited by fatigue;Patient limited by pain Patient left: in chair;with call bell/phone within reach  OT Visit Diagnosis: Unsteadiness on feet (R26.81);Pain Pain - part of body: (BACK)                Time: 6734-1937  OT Time Calculation (min): 38 min Charges:  OT General Charges $OT Visit: 1 Visit OT Evaluation $OT Eval Low Complexity: 1 Low OT Treatments $Self Care/Home Management : 8-22 mins  Joeseph Amor OTR/L  Acute Rehab Services  [phone] office number [phone] pager number   Joeseph Amor 05/18/2019, 11:41 AM

## 2019-05-18 NOTE — Progress Notes (Signed)
M[place], NP paged regarding pt's BP 92/59. NS infusing at 52mL/hr. Pt asymptomatic at this time. Will continue to monitor.

## 2019-05-18 NOTE — Progress Notes (Signed)
No new drainage noted to back from incision from beginning of shift.

## 2019-05-18 NOTE — Progress Notes (Signed)
Physical Therapy Treatment Patient Details Name: Morgan [name] MRN: [id number] DOB: [birth year] Today's Date: 05/18/2019    History of Present Illness Pt is a 53 y/o female s/p L4-5 PLIF. PMH includes bipolar disorder, CAP, DM, HTN, and OSA.     PT Comments    BP supine was 87/63 with pulse 59 and O2 sat 99%, sitting 105/66 with pulse 59 and O2 sat 98%, standing initially 104/69 adn pulse 69, 3 min stand 105/74, and after walking 99/69 and sat 99% with pulse 92.  Talked with nursing about these values after co-visit with OT and note her tolerance was fairly good, able to sit up in chair after walk.  Will progress with all movement as she is able to tolerate, esp will recommend ck of vitals with each visit.  Pain and low BP are her main limits for PT now.  Nursing is working on getting pain meds for her.    Follow Up Recommendations  Supervision for mobility/OOB;Home health PT     Equipment Recommendations  None recommended by PT    Recommendations for Other Services       Precautions / Restrictions Precautions Precautions: Back(Monitor BP before gait) Precaution Booklet Issued: No Precaution Comments: Reviewed back precautions with pt.  Required Braces or Orthoses: Spinal Brace Spinal Brace: Lumbar corset;Applied in sitting position Restrictions Weight Bearing Restrictions: No    Mobility  Bed Mobility Overal bed mobility: Needs Assistance Bed Mobility: Sidelying to Sit;Sit to Sidelying Rolling: Min guard Sidelying to sit: Min guard     Sit to sidelying: Min guard    Transfers Overall transfer level: Needs assistance Equipment used: Rolling walker (2 wheeled);1 person hand held assist Transfers: Sit to/from Stand Sit to Stand: Min guard            Ambulation/Gait Ambulation/Gait assistance: Min guard Gait Distance (Feet): 110 Feet Assistive device: Rolling walker (2 wheeled) Gait Pattern/deviations: Step-through pattern;Decreased stride length;Wide base of  support Gait velocity: Decreased  Gait velocity interpretation: <1.31 ft/sec, indicative of household ambulator General Gait Details: BP was checked before getting OOB and then with all progression of gait, pt is not getting narcotic meds due to low BP's   Stairs Stairs: Yes Stairs assistance: Min guard Stair Management: Two rails;Forwards;Alternating pattern;Step to pattern Number of Stairs: 8 General stair comments: pt is able to manage with min guard and controls with B rails   Wheelchair Mobility    Modified Rankin (Stroke Patients Only)       Balance Overall balance assessment: Needs assistance Sitting-balance support: Feet supported Sitting balance-Leahy Scale: Good     Standing balance support: Bilateral upper extremity supported Standing balance-Leahy Scale: Fair Standing balance comment: fair once set, a bit unsteady initially                            Cognition Arousal/Alertness: Awake/alert Behavior During Therapy: WFL for tasks assessed/performed Overall Cognitive Status: Within Functional Limits for tasks assessed                                        Exercises      General Comments General comments (skin integrity, edema, etc.): BP supine was87/63 with pulse 59 and O2 sat 99%, sitting 105/66 with pulse 59 and O2 sat 98%, standing initially 104/69 adn pulse 69, 3 min stand 105/74, and after walking 99/69 and  sat 99% with pulse 92.      Pertinent Vitals/Pain Pain Assessment: 0-10 Pain Score: 10-Worst pain ever Pain Location: back Pain Descriptors / Indicators: Aching;Operative site guarding Pain Intervention(s): Limited activity within patient's tolerance;Premedicated before session;Repositioned;Monitored during session    [place] expects to be discharged to:: Private residence Living Arrangements: Other relatives Available Help at Discharge: Family;Available 24 hours/day Type of Home: Mobile  home Home Access: Ramped entrance   Home Layout: One level Home Equipment: Starbrick - 2 wheels;Cane - single point;Bedside commode;Shower seat      Prior Function Level of Independence: Independent      Comments: was driving, not working (on disability)   PT Goals (current goals can now be found in the care plan section) Acute Rehab PT Goals Patient Stated Goal: to go home soon and decrease pain Progress towards PT goals: Progressing toward goals    Frequency    Min 5X/week      PT Plan Discharge plan needs to be updated    Co-evaluation   Reason for Co-Treatment: Complexity of the patient's impairments (multi-system involvement);For patient/therapist safety PT goals addressed during session: Mobility/safety with mobility;Balance;Other (comment)(to ck vitals and progress her) OT goals addressed during session: ADL's and self-care      AM-PAC PT "6 Clicks" Mobility   Outcome Measure  Help needed turning from your back to your side while in a flat bed without using bedrails?: A Little Help needed moving from lying on your back to sitting on the side of a flat bed without using bedrails?: A Little Help needed moving to and from a bed to a chair (including a wheelchair)?: A Little Help needed standing up from a chair using your arms (e.g., wheelchair or bedside chair)?: A Little Help needed to walk in hospital room?: A Little Help needed climbing 3-5 steps with a railing? : A Little 6 Click Score: 18    End of Session Equipment Utilized During Treatment: Gait belt;Back brace Activity Tolerance: Patient tolerated treatment well Patient left: with call bell/phone within reach;in chair Nurse Communication: Mobility status;Patient requests pain meds PT Visit Diagnosis: Other abnormalities of gait and mobility (R26.89);Pain Pain - part of body: (Back)     Time: 4917-9150 PT Time Calculation (min) (ACUTE ONLY): 38 min  Charges:  $Gait Training: 8-22 mins                    Ramond Dial 05/18/2019, 2:12 PM  Mee Hives, PT MS Acute Rehab Dept. Number: [place]

## 2019-05-18 NOTE — Progress Notes (Signed)
Notified on call NP Cleopatra Cedar for Dr. Kathyrn Sheriff about drop in pt blood pressure of 88/58. Pt asymptomatic, alert and verbal. Pt was advised to stay in bed and not walk this AM due to a drop in BP. New order received from NP.

## 2019-05-18 NOTE — Plan of Care (Signed)
  Problem: Clinical Measurements: Goal: Will remain free from infection Outcome: Progressing   Problem: Nutrition: Goal: Adequate nutrition will be maintained Outcome: Progressing   

## 2019-05-18 NOTE — Plan of Care (Signed)

## 2019-05-18 NOTE — Progress Notes (Signed)
Rechecked pt BP and it resulted as 101/64. Pt alert and verbal.

## 2019-05-18 NOTE — Progress Notes (Signed)
Meyran, NP returned page regarding pt's BP. New orders to follow. Will continue to monitor.

## 2019-05-19 ENCOUNTER — Inpatient Hospital Stay (HOSPITAL_COMMUNITY): Payer: Medicare Other

## 2019-05-19 DIAGNOSIS — R4182 Altered mental status, unspecified: Secondary | ICD-10-CM | POA: Diagnosis present

## 2019-05-19 DIAGNOSIS — D696 Thrombocytopenia, unspecified: Secondary | ICD-10-CM | POA: Insufficient documentation

## 2019-05-19 DIAGNOSIS — I959 Hypotension, unspecified: Secondary | ICD-10-CM

## 2019-05-19 DIAGNOSIS — E11649 Type 2 diabetes mellitus with hypoglycemia without coma: Secondary | ICD-10-CM

## 2019-05-19 DIAGNOSIS — K219 Gastro-esophageal reflux disease without esophagitis: Secondary | ICD-10-CM

## 2019-05-19 DIAGNOSIS — R410 Disorientation, unspecified: Secondary | ICD-10-CM

## 2019-05-19 DIAGNOSIS — Z419 Encounter for procedure for purposes other than remedying health state, unspecified: Secondary | ICD-10-CM

## 2019-05-19 LAB — CBC
HCT: 27.7 % — ABNORMAL LOW (ref 36.0–46.0)
Hemoglobin: 9.1 g/dL — ABNORMAL LOW (ref 12.0–15.0)
MCH: 31 pg (ref 26.0–34.0)
MCHC: 32.9 g/dL (ref 30.0–36.0)
MCV: 94.2 fL (ref 80.0–100.0)
Platelets: 89 10*3/uL — ABNORMAL LOW (ref 150–400)
RBC: 2.94 MIL/uL — ABNORMAL LOW (ref 3.87–5.11)
RDW: 12.8 % (ref 11.5–15.5)
WBC: 2.5 10*3/uL — ABNORMAL LOW (ref 4.0–10.5)
nRBC: 0 % (ref 0.0–0.2)

## 2019-05-19 LAB — BASIC METABOLIC PANEL
Anion gap: 6 (ref 5–15)
BUN: 7 mg/dL (ref 6–20)
CO2: 30 mmol/L (ref 22–32)
Calcium: 8.1 mg/dL — ABNORMAL LOW (ref 8.9–10.3)
Chloride: 105 mmol/L (ref 98–111)
Creatinine, Ser: 0.44 mg/dL (ref 0.44–1.00)
GFR calc Af Amer: >60 mL/min (ref >60)
GFR calc non Af Amer: >60 mL/min (ref >60)
Glucose, Bld: 126 mg/dL — ABNORMAL HIGH (ref 70–99)
Potassium: 3.3 mmol/L — ABNORMAL LOW (ref 3.5–5.1)
Sodium: 141 mmol/L (ref 135–145)

## 2019-05-19 LAB — BLOOD GAS, ARTERIAL

## 2019-05-19 LAB — GLUCOSE, CAPILLARY
Glucose-Capillary: 65 mg/dL — ABNORMAL LOW (ref 70–99)
Glucose-Capillary: 121 mg/dL — ABNORMAL HIGH (ref 70–99)

## 2019-05-19 MED ORDER — TIZANIDINE HCL 2 MG PO TABS: 2.0000 mg | ORAL_TABLET | Freq: Three times a day (TID) | ORAL | 0 refills | Status: DC | PRN

## 2019-05-19 MED ORDER — DEXTROSE 50 % IV SOLN
INTRAVENOUS | Status: AC
  Administered 2019-05-19: 25 mL
  Filled 2019-05-19: qty 50

## 2019-05-19 MED ORDER — SODIUM CHLORIDE 0.9 % IV BOLUS
1000.0000 mL | Freq: Once | INTRAVENOUS | Status: AC
  Administered 2019-05-19: 1000 mL via INTRAVENOUS

## 2019-05-19 MED ORDER — SODIUM CHLORIDE 0.9 % IV BOLUS
500.0000 mL | Freq: Once | INTRAVENOUS | Status: AC
  Administered 2019-05-19: 500 mL via INTRAVENOUS

## 2019-05-19 MED ORDER — OXYCODONE HCL 5 MG PO TABS: 5.0000 mg | ORAL_TABLET | ORAL | 0 refills | Status: DC | PRN

## 2019-05-19 MED ORDER — SODIUM CHLORIDE 0.9 % IV BOLUS
500.0000 mL | Freq: Once | INTRAVENOUS | Status: AC | PRN
  Administered 2019-05-19: 500 mL via INTRAVENOUS

## 2019-05-19 NOTE — Progress Notes (Signed)
Nurse Tech notified primary nurse of a drop in BP of 88/48 @0015 . Reassessed pt and noted that pt was lethargic and only responds to voice. Rechecked BP manually and got 82/44. Notified charge nurse. Attempted to call on call NP x 2 with no answering service. Called rapid response RN @0045 . RR,RN placed order for 554mL bolus and came to see pt. Attempted to call on call NP again. No answering service. Pt continues to be lethargic and only responds to voice after 568mL bolus. RR, RN at bedside. Attempted to call on call NP @0118  and received call back @0125 . New orders received.

## 2019-05-19 NOTE — Progress Notes (Signed)
Physical Therapy Treatment Patient Details Name: Morgan [name] MRN: [id number] DOB: 1966-10-28 Today's Date: 05/19/2019    History of Present Illness Pt is a 53 y/o female s/p L4-5 PLIF. PMH includes bipolar disorder, CAP, DM, HTN, and OSA.     PT Comments    Pt mobilizing well today, ambulated to bathroom without AD with min-guard A and 200' with RW and supervision. VSS throughout. Reviewed back precautions and she can verbalize them but is not always keeping them with mobility, stressed the importance of this as well as good posture and abdominal activation. PT will continue to follow.    Follow Up Recommendations  Supervision for mobility/OOB;Home health PT     Equipment Recommendations  None recommended by PT    Recommendations for Other Services       Precautions / Restrictions Precautions Precautions: Back(Monitor BP before gait) Precaution Booklet Issued: No Precaution Comments: pt able to verbalize precautions but not always maintaining them with function Required Braces or Orthoses: Spinal Brace Spinal Brace: Lumbar corset;Applied in sitting position Restrictions Weight Bearing Restrictions: No    Mobility  Bed Mobility Overal bed mobility: Needs Assistance Bed Mobility: Sidelying to Sit;Rolling Rolling: Supervision Sidelying to sit: Supervision       General bed mobility comments: vc's for maintaining back precautions.   Transfers Overall transfer level: Needs assistance Equipment used: Rolling walker (2 wheeled);1 person hand held assist Transfers: Sit to/from Stand Sit to Stand: Supervision         General transfer comment: supervision for safety  Ambulation/Gait Ambulation/Gait assistance: Min guard;Supervision Gait Distance (Feet): 215 Feet Assistive device: Rolling walker (2 wheeled);None Gait Pattern/deviations: Step-through pattern;Decreased stride length;Wide base of support Gait velocity: Decreased  Gait velocity interpretation: 1.31 -  2.62 ft/sec, indicative of limited community ambulator General Gait Details: VSS and pt alert. Ambulated to bathroom without AD, no LOB. Then 200' with RW and supervision. vc's for not twisting with ambulation. Practiced turning and navigating through spaces   Stairs             Wheelchair Mobility    Modified Rankin (Stroke Patients Only)       Balance Overall balance assessment: Needs assistance Sitting-balance support: Feet supported Sitting balance-Leahy Scale: Good     Standing balance support: Bilateral upper extremity supported Standing balance-Leahy Scale: Fair Standing balance comment: able to stand to wash hands without holding to RW and able to ambualte short distances without UE support                            Cognition Arousal/Alertness: Awake/alert Behavior During Therapy: WFL for tasks assessed/performed Overall Cognitive Status: Within Functional Limits for tasks assessed                                        Exercises      General Comments General comments (skin integrity, edema, etc.): HR 76 bpm with ambulation      Pertinent Vitals/Pain Pain Assessment: 0-10 Pain Score: 7  Pain Location: back Pain Descriptors / Indicators: Aching;Operative site guarding Pain Intervention(s): Limited activity within patient's tolerance;Monitored during session    Home Living                      Prior Function            PT Goals (current goals  can now be found in the care plan section) Acute Rehab PT Goals Patient Stated Goal: to go home soon and decrease pain PT Goal Formulation: With patient Time For Goal Achievement: 05/31/19 Potential to Achieve Goals: Good Progress towards PT goals: Progressing toward goals    Frequency    Min 5X/week      PT Plan Current plan remains appropriate    Co-evaluation              AM-PAC PT "6 Clicks" Mobility   Outcome Measure  Help needed turning from  your back to your side while in a flat bed without using bedrails?: A Little Help needed moving from lying on your back to sitting on the side of a flat bed without using bedrails?: A Little Help needed moving to and from a bed to a chair (including a wheelchair)?: A Little Help needed standing up from a chair using your arms (e.g., wheelchair or bedside chair)?: A Little Help needed to walk in hospital room?: A Little Help needed climbing 3-5 steps with a railing? : A Little 6 Click Score: 18    End of Session Equipment Utilized During Treatment: Gait belt;Back brace Activity Tolerance: Patient tolerated treatment well Patient left: with call bell/phone within reach;in chair Nurse Communication: Mobility status;Patient requests pain meds PT Visit Diagnosis: Other abnormalities of gait and mobility (R26.89);Pain Pain - part of body: (Back)     Time: 7673-4193 PT Time Calculation (min) (ACUTE ONLY): 16 min  Charges:  $Gait Training: 8-22 mins                     Leighton Roach, [place]  Pager [phone] Office [place] 05/19/2019, 9:19 AM

## 2019-05-19 NOTE — Progress Notes (Signed)
Called to the room due to patient being lethargic and oxygen desaturation. On arrival noted patient to be notable arousable upon verbal commands. Patient seems very sleepy. RT suggested to get a blood sugar check in which it was 65. CPAP placed per active orders settings are adjusted to patient tolerance per NIV protocol. Respirations are shallow but patient currently is maintaining her oxygen saturations in acceptable parameters.  ABG comes back to be normal. No respiratory distress noted. No significant respiratory compromise noted. At this time patient is clinically stable. RT will continue monitor and assess patient as needed or upon request.   Anell Barr, B.S, RRT, RCP

## 2019-05-19 NOTE — Progress Notes (Signed)
Occupational Therapy Treatment Patient Details Name: Morgan [name] MRN: [id number] DOB: October 22, 1966 Today's Date: 05/19/2019    History of present illness Pt is a 53 y/o female s/p L4-5 PLIF. PMH includes bipolar disorder, CAP, DM, HTN, and OSA.    OT comments  Pt progressing well toward occupational therapy goals. She is able to verbalize precautions; however, she requires cues throughout all activities to implement these. She was able to complete toileting, shower transfers, and grooming tasks with min guard to supervision level assistance with cues for maintaining precautions. She would benefit from continued OT services while admitted to improve independence with ADL. Recommend initial 24 hour supervision at home.    Follow Up Recommendations  Home health OT;Supervision/Assistance - 24 hour    Equipment Recommendations  None recommended by OT    Recommendations for Other Services      Precautions / Restrictions Precautions Precautions: Back(Monitor BP before gait) Precaution Booklet Issued: No Precaution Comments: pt able to verbalize precautions but not always maintaining them with function Required Braces or Orthoses: Spinal Brace Spinal Brace: Lumbar corset;Applied in sitting position Restrictions Weight Bearing Restrictions: No       Mobility Bed Mobility Overal bed mobility: Needs Assistance Bed Mobility: Sidelying to Sit;Rolling Rolling: Supervision Sidelying to sit: Supervision     Sit to sidelying: Supervision General bed mobility comments: Verbal cues for technique and back precautions.   Transfers Overall transfer level: Needs assistance Equipment used: Rolling walker (2 wheeled);1 person hand held assist Transfers: Sit to/from Stand Sit to Stand: Supervision         General transfer comment: cues for hand placement    Balance Overall balance assessment: Needs assistance Sitting-balance support: Feet supported Sitting balance-Leahy Scale: Good      Standing balance support: Bilateral upper extremity supported Standing balance-Leahy Scale: Fair Standing balance comment: Able to stand for ADL                           ADL either performed or assessed with clinical judgement   ADL Overall ADL's : Needs assistance/impaired     Grooming: Supervision/safety;Wash/dry hands Grooming Details (indicate cue type and reason): cues to avoid twisting             Lower Body Dressing: Supervision/safety;Sit to/from stand Lower Body Dressing Details (indicate cue type and reason): cues for precautions Toilet Transfer: Min guard;Ambulation;BSC;Regular Glass blower/designer Details (indicate cue type and reason): BSC over regular toilet Toileting- Clothing Manipulation and Hygiene: Min guard;Cueing for back precautions   Tub/ Shower Transfer: Walk-in shower;Min guard;Shower seat   Functional mobility during ADLs: Min guard;Rolling walker General ADL Comments: Pt able to verbalize precautions but needs moderate reminders throughout activities.      Vision       Perception     Praxis      Cognition Arousal/Alertness: Awake/alert Behavior During Therapy: WFL for tasks assessed/performed Overall Cognitive Status: Within Functional Limits for tasks assessed                                          Exercises     Shoulder Instructions       General Comments VSS    Pertinent Vitals/ Pain       Pain Assessment: 0-10 Pain Score: 7  Pain Location: back Pain Descriptors / Indicators: Aching;Operative site guarding Pain Intervention(s): Limited  activity within patient's tolerance  Home Living                                          Prior Functioning/Environment              Frequency  Min 2X/week        Progress Toward Goals  OT Goals(current goals can now be found in the care plan section)  Progress towards OT goals: Progressing toward goals  Acute Rehab OT  Goals Patient Stated Goal: to go home soon and decrease pain OT Goal Formulation: With patient Time For Goal Achievement: 05/25/19 Potential to Achieve Goals: Good ADL Goals Pt Will Perform Lower Body Bathing: with modified independence Pt Will Perform Lower Body Dressing: with modified independence;with adaptive equipment;sit to/from stand Pt Will Transfer to Toilet: with modified independence;regular height toilet  Plan Discharge plan remains appropriate    Co-evaluation                 AM-PAC OT "6 Clicks" Daily Activity     Outcome Measure   Help from another person eating meals?: None Help from another person taking care of personal grooming?: None Help from another person toileting, which includes using toliet, bedpan, or urinal?: A Little Help from another person bathing (including washing, rinsing, drying)?: A Little Help from another person to put on and taking off regular upper body clothing?: None Help from another person to put on and taking off regular lower body clothing?: None 6 Click Score: 22    End of Session Equipment Utilized During Treatment: Gait belt;Rolling walker;Back brace  OT Visit Diagnosis: Unsteadiness on feet (R26.81);Pain Pain - part of body: (back)   Activity Tolerance Patient tolerated treatment well   Patient Left in chair;with call bell/phone within reach   Nurse Communication          Time: 0902-0920 OT Time Calculation (min): 18 min  Charges: OT General Charges $OT Visit: 1 Visit OT Treatments $Self Care/Home Management : 8-22 mins  [place] Office [place] 05/19/2019, 9:43 AM

## 2019-05-19 NOTE — Progress Notes (Signed)
Pt BP increased to 87/58 after 1L NS bolus. Pt became less lethargic and more alert. Rechecked BP again at 0529 and it was 100/68. Pt alert, and verbal at this time.

## 2019-05-19 NOTE — Progress Notes (Signed)
Came by in AM to see pt, but PT working with pt in bathroom. When I came back, pt had been d/c'd.  See previous c/s note for hospitalists recs, though seems like pt had improved based on my discussion with nursing this AM.

## 2019-05-19 NOTE — Consult Note (Addendum)
Medical Consultation   Morgan [name]  MWU:[id number]  DOB: 06-Mar-1966  DOA: 05/17/2019  PCP: Janora Norlander, DO   Outpatient Specialists: None  Requesting physician: Dr. Kathyrn Sheriff  Reason for consultation: Altered mental status and hypotension   History of Present Illness: Morgan [name] is an 53 y.o. female Who is status post L4-L5 laminectomy with facetectomy for decompression of exiting nerve roots.  Patient had other back procedures by neurosurgery on 05/17/2019.  She has been recuperating postoperatively.  Tonight however she was found to be laying down in bed lethargic arousable when arousable was a little alert.  Was noted to be hypoxic on room air with oxygen sat in the 80s.  More notably blood pressure in the 72Z systolic and glucose 65.  Rapid response was called.  Patient received about a liter bolus of IV fluids and the systolic is still only in the 80s.  She has been placed on CPAP.  Initial ABG showed no evidence of any abnormalities.  Patient is able to talk to me and describe that she had a similar episode previously when she had back surgeries.  She is laying down in bed and not dizzy at this point.  She had a similar episode yesterday and was started on IV fluids.  Blood pressure went above 366 systolic during the day but now is persistently in the 80s.  Denied any fever or chills.  She has pancytopenia notably with a white count of 2.3.  No differentials available.  No evidence of sepsis.  Patient has not received any narcotics today.  No nausea vomiting or diarrhea.   Review of Systems:  Review of Systems  Constitutional: Negative for chills and fever.  HENT: Negative.   Eyes: Negative.   Respiratory: Negative.   Cardiovascular: Negative.   Gastrointestinal: Negative for diarrhea, nausea and vomiting.  Genitourinary: Negative.   Musculoskeletal: Positive for back pain.  Skin: Negative.   Neurological: Positive for weakness. Negative for  dizziness.  Endo/Heme/Allergies: Negative.   Psychiatric/Behavioral: Negative.    As per HPI otherwise 10 point review of systems negative.     Past Medical History: Past Medical History:  Diagnosis Date  . Allergic rhinitis   . Anemia   . Anxiety   . Barrett's esophagus   . Bipolar affective disorder ([place])   . CAP (community acquired pneumonia) 07/20/2018  . Chronic respiratory failure ([place])   . Constipation, chronic   . Degenerative joint disease of spine   . Depression   . Diabetes mellitus without complication ([employer])    type 2   . Dry eye   . Family history of adverse reaction to anesthesia    nausea and vomiting  . Gastroparesis   . GERD (gastroesophageal reflux disease)   . History of bariatric surgery 03/2017  . History of hiatal hernia   . HLD (hyperlipidemia) 03/12/2013  . Hyperlipidemia   . Hypertension   . Intractable chronic migraine without aura 06/04/2015  . Migraine headache   . Morbid obesity ([place])   . OSA (obstructive sleep apnea)    No cpap since gastric surgery  . Osteoarthritis    bilateral knee  . SBO (small bowel obstruction) ([place]) 03/19/2019  . Sleep apnea   . Unspecified hypothyroidism   . Vitamin B 12 deficiency   . Vitamin D deficiency     Past Surgical History: Past Surgical History:  Procedure Laterality Date  .  APPENDECTOMY  1978  . CHOLECYSTECTOMY  2005  . COLONOSCOPY    . GASTRIC ROUX-EN-Y N/A 03/21/2017   Procedure: LAPAROSCOPIC ROUX-EN-Y GASTRIC, UPPER ENDO;  Surgeon: Greer Pickerel, MD;  Location: [facility];  Service: General;  Laterality: N/A;  . GASTROSTOMY N/A 06/19/2018   Procedure: LAPRASCOPIC INSERTION OF GASTROSTOMY TUBE;  Surgeon: Greer Pickerel, MD;  Location: [facility];  Service: General;  Laterality: N/A;  . GASTROSTOMY TUBE PLACEMENT Left    06/2018  . HERNIA REPAIR    . HIATAL HERNIA REPAIR N/A 06/19/2018   Procedure: LAPAROSCOPIC REPAIR OF HIATAL HERNIA;  Surgeon: Greer Pickerel, MD;  [facility];  Service: General;  Laterality:  N/A;  . LAPAROSCOPIC LYSIS OF ADHESIONS  03/19/2019   Dr. Romana Juniper  . LAPAROSCOPY N/A 06/19/2018   Procedure: LAPAROSCOPY DIAGNOSTIC;  Surgeon: Greer Pickerel, MD;  Location: [facility];  Service: General;  Laterality: N/A;  . LAPAROSCOPY N/A 03/19/2019   Procedure: LAPAROSCOPY DIAGNOSTIC lysis of adhesions;  Surgeon: Clovis Riley, MD;  Location: [facility];  Service: General;  Laterality: N/A;  . LUMBAR LAMINECTOMY/DECOMPRESSION MICRODISCECTOMY Right 10/11/2018   Procedure: LAMINECTOMY AND FORAMINOTOMY RIGHT LUMBAR FOUR- LUMBAR FIVE;  Surgeon: Consuella Lose, MD;  Location: [place];  Service: Neurosurgery;  Laterality: Right;  . SHOULDER ARTHROSCOPY  7/12   left-dsc  . TONSILLECTOMY  at age 39  . TRIGGER FINGER RELEASE  12/20/2012   Procedure: RELEASE TRIGGER FINGER/A-1 PULLEY;  Surgeon: Tennis Must, MD;  Location: [place];  Service: Orthopedics;  Laterality: Left;  LEFT TRIGGER THUMB RELEASE     Allergies:   Allergies  Allergen Reactions  . Ketoconazole Hives and Swelling    SWELLING REACTION UNSPECIFIED   . Pravachol [Pravastatin Sodium] Shortness Of Breath, Swelling and Anaphylaxis    Throat swelling  . Statins Other (See Comments)    Leg cramps  . Tape Dermatitis and Other (See Comments)    Steri-Strips  . Codeine Hypertension    increased BP     Social History:  reports that she has never smoked. She has never used smokeless tobacco. She reports that she does not drink alcohol or use drugs.   Family History: Family History  Problem Relation Age of Onset  . Asthma Father   . Allergies Father   . Heart disease Father        enlarged heart  . Peripheral vascular disease Father   . Diabetes Father   . Hyperlipidemia Father   . Arthritis Father   . Asthma Sister   . Cancer Sister        colon at 47 yr old.  . Colon cancer Sister   . Allergies Mother   . Stroke Mother 76       with hemi paralysis  . Diabetes Mother   . Hyperlipidemia Mother    . Hypertension Mother   . GI problems Mother   . Arthritis Mother   . Allergies Brother   . Early death Brother 9       congenital abormality  . Allergies Sister   . Diabetes Sister   . Asthma Sister   . Colon polyps Sister   . Hyperlipidemia Sister   . GI problems Sister        gastroporesis   . Liver disease Sister        fatty liver  . Stroke Sister        intercrandial bleed  . Diabetes Brother   . Hypertension Brother   .  Hyperlipidemia Brother   . Heart disease Paternal Aunt   . Migraines Neg Hx      Physical Exam: Vitals:   05/19/19 0237 05/19/19 0257 05/19/19 0317 05/19/19 0423  BP: (!) 71/47 (!) 78/49 (!) 87/55 (!) 87/58  Pulse:      Resp:      Temp:      TempSrc:      SpO2:      Weight:      Height:        Constitutional: Arousable but lethargic otherwise, alert and awake, oriented x3, not in any acute distress. Eyes: PERLA, EOMI, irises appear normal, anicteric sclera,  ENMT: external ears and nose appear normal,            Lips appears normal, oropharynx mucosa, tongue, posterior pharynx appear normal  Neck: neck appears normal, no masses, normal ROM, no thyromegaly, no JVD  CVS: S1-S2 clear, no murmur rubs or gallops, no LE edema, normal pedal pulses  Respiratory:  clear to auscultation bilaterally, no wheezing, rales or rhonchi. Respiratory effort normal. No accessory muscle use.  Abdomen: soft nontender, nondistended, normal bowel sounds, no hepatosplenomegaly, no hernias  Musculoskeletal: : no cyanosis, clubbing or edema noted bilaterally Neuro: Cranial nerves II-XII intact, strength, sensation, reflexes Psych: judgement and insight appear normal, stable mood and affect, mental status Skin: Surgical site appears clean and dressing changed.  No rashes or lesions or ulcers, no induration or nodules   Data reviewed:  I have personally reviewed following labs and imaging studies Labs:  CBC: Recent Labs  Lab 05/14/19 0936 05/18/19 0202  05/19/19 0230  WBC 3.8* 2.3* 2.5*  HGB 12.7 10.2* 9.1*  HCT 40.6 31.2* 27.7*  MCV 97.6 93.4 94.2  PLT 147* 92* 89*    Basic Metabolic Panel: Recent Labs  Lab 05/14/19 0936 05/18/19 0202 05/19/19 0230  NA 142 141 141  K 4.4 3.8 3.3*  CL 102 104 105  CO2 30 27 30   GLUCOSE 80 76 126*  BUN 22* 11 7  CREATININE 0.62 0.58 0.44  CALCIUM 8.8* 8.4* 8.1*   GFR Estimated Creatinine Clearance: 71.8 mL/min (by C-G formula based on SCr of 0.44 mg/dL). Liver Function Tests: No results for input(s): AST, ALT, ALKPHOS, BILITOT, PROT, ALBUMIN in the last 168 hours. No results for input(s): LIPASE, AMYLASE in the last 168 hours. No results for input(s): AMMONIA in the last 168 hours. Coagulation profile Recent Labs  Lab 05/18/19 0202  INR 1.0    Cardiac Enzymes: No results for input(s): CKTOTAL, CKMB, CKMBINDEX, TROPONINI in the last 168 hours. BNP: Invalid input(s): POCBNP CBG: Recent Labs  Lab 05/17/19 0542 05/17/19 1105 05/17/19 1742 05/19/19 0201 05/19/19 0227  GLUCAP 88 116* 123* 65* 121*   D-Dimer No results for input(s): DDIMER in the last 72 hours. Hgb A1c No results for input(s): HGBA1C in the last 72 hours. Lipid Profile No results for input(s): CHOL, HDL, LDLCALC, TRIG, CHOLHDL, LDLDIRECT in the last 72 hours. Thyroid function studies No results for input(s): TSH, T4TOTAL, T3FREE, THYROIDAB in the last 72 hours.  Invalid input(s): FREET3 Anemia work up No results for input(s): VITAMINB12, FOLATE, FERRITIN, TIBC, IRON, RETICCTPCT in the last 72 hours. Urinalysis    Component Value Date/Time   COLORURINE YELLOW 03/19/2019 0052   APPEARANCEUR CLEAR 03/19/2019 0052   LABSPEC 1.029 03/19/2019 0052   PHURINE 5.0 03/19/2019 0052   GLUCOSEU 50 (A) 03/19/2019 0052   HGBUR NEGATIVE 03/19/2019 [place] NEGATIVE 03/19/2019 0052  BILIRUBINUR neg 10/07/2013 0954   KETONESUR 5 (A) 03/19/2019 0052   PROTEINUR NEGATIVE 03/19/2019 0052   UROBILINOGEN  negative 10/07/2013 0954   NITRITE NEGATIVE 03/19/2019 0052   LEUKOCYTESUR NEGATIVE 03/19/2019 0052     Sepsis Labs Invalid input(s): PROCALCITONIN,  WBC,  LACTICIDVEN Microbiology Recent Results (from the past 240 hour(s))  Surgical pcr screen     Status: None   Collection Time: 05/14/19  9:36 AM  Result Value Ref Range Status   MRSA, PCR NEGATIVE NEGATIVE Final   Staphylococcus aureus NEGATIVE NEGATIVE Final    Comment: (NOTE) The Xpert SA Assay (FDA approved for NASAL specimens in patients 40 years of age and older), is one component of a comprehensive surveillance program. It is not intended to diagnose infection nor to guide or monitor treatment. Performed at [facility], [place] [address] [place], [place] [address]   Novel Coronavirus, NAA (hospital order; send-out to ref lab)     Status: None   Collection Time: 05/14/19  9:59 AM  Result Value Ref Range Status   SARS-CoV-2, NAA NOT DETECTED NOT DETECTED Final    Comment: (NOTE) This test was developed and its performance characteristics determined by [employer]. This test has not been FDA cleared or approved. This test has been authorized by FDA under an Emergency Use Authorization (EUA). This test is only authorized for the duration of time the declaration that circumstances exist justifying the authorization of the emergency use of in vitro diagnostic tests for detection of SARS-CoV-2 virus and/or diagnosis of COVID-19 infection under section 564(b)(1) of the Act, 21 U.S.C. 387FIE-3(P)(2), unless the authorization is terminated or revoked sooner. When diagnostic testing is negative, the possibility of a false negative result should be considered in the context of a patient's recent exposures and the presence of clinical signs and symptoms consistent with COVID-19. An individual without symptoms of COVID-19 and who is not shedding SARS-CoV-2 virus would expect to have a negative (not detected) result in  this assay. Performed  At: [facility] [address] [place], [place] [id number] Rush Farmer MD AY:[id number]    [place]  Final    Comment: Performed at [facility], [place] [address], [place], [place] [address]       Inpatient Medications:   Scheduled Meds: . acetaminophen  1,000 mg Oral Once  . acidophilus  1 capsule Oral Daily  . calcium-vitamin D  1 tablet Oral BID  . Chlorhexidine Gluconate Cloth  6 each Topical Once   And  . Chlorhexidine Gluconate Cloth  6 each Topical Once  . cycloSPORINE  1 drop Both Eyes BID  . divalproex  500 mg Oral Daily   And  . divalproex  1,000 mg Oral QHS  . docusate sodium  100 mg Oral BID  . gabapentin  300 mg Oral Q1200  . gabapentin  600 mg Oral BID AC & HS  . levothyroxine  112 mcg Oral Q0600  . multivitamin with minerals  1 tablet Oral Daily  . olopatadine  1 drop Both Eyes q morning - 10a  . ondansetron  4 mg Oral Q8H  . pantoprazole  40 mg Oral Daily  . polyethylene glycol  17 g Oral QHS  . scopolamine  1 patch Transdermal Q72H  . senna  1 tablet Oral BID  . sertraline  200 mg Oral Daily  . sodium chloride flush  3 mL Intravenous Q12H  . ziprasidone  60 mg Oral QHS   Continuous  Infusions: . sodium chloride    . sodium chloride 75 mL/hr at 05/18/19 0624  . methocarbamol (ROBAXIN) IV       Radiological Exams on Admission: Dg Lumbar Spine 2-3 Views  Result Date: 05/17/2019 CLINICAL DATA:  L4 fusion. EXAM: DG C-ARM 61-120 MIN; LUMBAR SPINE - 2-3 VIEW COMPARISON:  CT 03/19/2019.  MRI 01/16/2019. FINDINGS: Lumbar spine numbered as per prior MRI. L4-L5 posterior and interbody fusion. Hardware intact. Pedicle screws noted in the region of the upper endplates of the L4 and L5 vertebral bodies. Anatomic alignment. Degenerative change lumbar spine. No acute bony abnormality. Two images obtained. 0 minutes 40 seconds fluoroscopy time utilized. IMPRESSION: L4-L5 posterior and interbody fusion.  Hardware intact. Anatomic alignment. Electronically Signed   By: Marcello Moores  Register   On: 05/17/2019 11:48   Dg Chest Portable 1 View  Result Date: 05/19/2019 CLINICAL DATA:  53 year old female with altered mental status. EXAM: PORTABLE CHEST 1 VIEW COMPARISON:  Chest radiograph dated 09/25/2018 FINDINGS: The heart size and mediastinal contours are within normal limits. Both lungs are clear. The visualized skeletal structures are unremarkable. IMPRESSION: No active disease. Electronically Signed   By: Anner Crete M.D.   On: 05/19/2019 02:21   Dg C-arm 1-60 Min  Result Date: 05/17/2019 CLINICAL DATA:  L4 fusion. EXAM: DG C-ARM 61-120 MIN; LUMBAR SPINE - 2-3 VIEW COMPARISON:  CT 03/19/2019.  MRI 01/16/2019. FINDINGS: Lumbar spine numbered as per prior MRI. L4-L5 posterior and interbody fusion. Hardware intact. Pedicle screws noted in the region of the upper endplates of the L4 and L5 vertebral bodies. Anatomic alignment. Degenerative change lumbar spine. No acute bony abnormality. Two images obtained. 0 minutes 40 seconds fluoroscopy time utilized. IMPRESSION: L4-L5 posterior and interbody fusion. Hardware intact. Anatomic alignment. Electronically Signed   By: [place]   On: 05/17/2019 11:48    Impression/Recommendations Active Problems:   Obstructive sleep apnea   DM (diabetes mellitus) ([place])   Bipolar disorder (HCC)   GERD (gastroesophageal reflux disease)   Hyperlipidemia associated with type 2 diabetes mellitus ([place])   Hypotension   Lumbar radiculopathy   AMS (altered mental status)  Plan: #1 lethargy: Appears to be related to hypotension and hypoglycemia.  Hypoglycemia corrected blood sugar now 122.  Persistent hypotension could be related to some vagal response.  Hypotension suspected as a cause.  Treat underlying cause.  Patient otherwise is able to carry on conversation with me once she is awake.  #2 persistent hypotension: Suspect some measures of dehydration as well.  I  will give another bolus of 1 L normal saline.  If blood pressure is rising then more than likely she needs more fluids.  If not patient may require pressors.  Sepsis work-up can be initiated even though she is not febrile.  She has leukopenia.  We can obtain differential to see if she is neutropenic as well.  #3 status post back surgery: Patient appears to be stable and requiring continued physical therapy and Occupational Therapy.  Defer to neurosurgery.  #4 diabetes: Blood sugar again was low at 65.  Monitor her medications to avoid hypoglycemic episodes.  #5 transient hypoxemia: Patient appears to have some measures of obstructive sleep apnea.  Continue CPAP in the moment.  It may have contributed to patient's confusion.  #6 bipolar disorder: Continue home regimen.  Thank you for this consultation.  Our [facility] hospitalist team will follow the patient with you.   Time Spent: 71 minutes  GARBA,LAWAL M.D. [facility] 05/19/2019, 5:23 AM

## 2019-05-19 NOTE — Progress Notes (Signed)
Rechecked pt BP and it was 67/47 then 73/48, then 71/47. Notified on call NP and new order received for another 563mL NS bolus and stated she will contact internal medicine to come check pt. RR, RN at bedside. Dr. Irene Pap came to assess pt. New orders received to give an additional 1L NS bolus. Per MD, stated, "if pt BP does not increase after bolus, may have to transfer to ICU".

## 2019-05-19 NOTE — TOC Transition Note (Signed)
Transition of Care [facility]) - CM/SW Discharge Note   Patient Details  Name: Morgan [name] MRN: [id number] Date of Birth: [birth year]  Transition of Care [facility]) CM/SW Contact:  Pollie Friar, RN Phone Number: 05/19/2019, 11:02 AM   Clinical Narrative:    Pt states her niece will assist at home. She also has transportation home when medically ready.   Final next level of care: Home w Home Health Services Barriers to Discharge: No Barriers Identified   Patient Goals and CMS Choice   CMS Medicare.gov Compare Post Acute Care list provided to:: Patient Choice offered to / list presented to : Patient  Discharge Placement                       Discharge Plan and Services   Discharge Planning Services: CM Consult Post Acute Care Choice: Home Health                    HH Arranged: PT, OT [facility] Agency: [place] ([facility]) Date [facility] Agency Contacted: 05/19/19 Time [place]: [phone] Representative spoke with at [place]: [place] ([place]) Interventions     Readmission Risk Interventions No flowsheet data found.

## 2019-05-19 NOTE — TOC Initial Note (Signed)
Transition of Care [facility]) - Initial/Assessment Note    Patient Details  Name: Morgan [name] MRN: [id number] Date of Birth: 09-21-1966  Transition of Care [facility]) CM/SW Contact:    Pollie Friar, RN Phone Number: 05/19/2019, 11:02 AM  Clinical Narrative:                   Expected Discharge Plan: [place] Services Barriers to Discharge: No Barriers Identified   Patient Goals and CMS Choice   CMS Medicare.gov Compare Post Acute Care list provided to:: Patient Choice offered to / list presented to : Patient  Expected Discharge Plan and Services Expected Discharge Plan: [place]   Discharge Planning Services: CM Consult Post Acute Care Choice: Home Health   Expected Discharge Date: 05/19/19                         HH Arranged: PT, OT HH Agency: [place] ([place]) Date HH Agency Contacted: 05/19/19 Time [place]: [phone] Representative spoke with at [place]: [place] Arrangements/Services   Lives with:: Other (Comment)(niece) Patient language and need for interpreter reviewed:: Yes(no needs) Do you feel safe going back to the place where you live?: Yes      Need for Family Participation in Patient Care: Yes (Comment) Care giver support system in place?: Yes (comment)(niece)   Criminal Activity/Legal Involvement Pertinent to Current Situation/Hospitalization: No - Comment as needed  Activities of Daily Living Home Assistive Devices/Equipment: Cane (specify quad or straight), CBG Meter, Blood pressure cuff ADL Screening (condition at time of admission) Patient's cognitive ability adequate to safely complete daily activities?: Yes Is the patient deaf or have difficulty hearing?: No Does the patient have difficulty seeing, even when wearing glasses/contacts?: No Does the patient have difficulty concentrating, remembering, or making decisions?: No Patient able to express need for assistance with ADLs?:  Yes Does the patient have difficulty dressing or bathing?: No Independently performs ADLs?: Yes (appropriate for developmental age) Does the patient have difficulty walking or climbing stairs?: No Weakness of Legs: Right Weakness of Arms/Hands: None  Permission Sought/Granted                  Emotional Assessment Appearance:: Appears stated age Attitude/Demeanor/Rapport: Engaged Affect (typically observed): Accepting, Appropriate, Pleasant Orientation: : Oriented to Self, Oriented to Place, Oriented to  Time, Oriented to Situation   Psych Involvement: No (comment)  Admission diagnosis:  Lumbar radiculopathy [M54.16] Patient Active Problem List   Diagnosis Date Noted  . AMS (altered mental status) 05/19/2019  . Thrombocytopenia ([place]) 05/19/2019  . SBO (small bowel obstruction) s/p lap LOA 03/19/2019 03/19/2019  . Chronic migraine without aura, with intractable migraine, so stated, with status migrainosus 11/20/2018  . Lumbar radiculopathy 10/11/2018  . Atelectasis   . Hypoglycemia   . Hypotension   . Bradycardia   . Epigastric pain 06/19/2018  . History of adenomatous polyp of colon 10/23/2017  . Hyperlipidemia associated with type 2 diabetes mellitus ([place]) 07/06/2017  . GERD (gastroesophageal reflux disease) 03/21/2017  . History of Roux-en-Y gastric bypass 2018 03/21/2017  . Morbid obesity with BMI of 40.0-44.9, adult ([place]) 06/06/2016  . Intractable chronic migraine without aura 06/04/2015  . Abnormal uterine bleeding 05/13/2015  . Pancreatitis 02/27/2014  . Fatty liver disease, nonalcoholic 02/72/5366  . Bipolar disorder ([place]) 02/20/2014  . Metabolic syndrome 44/02/4741  . Vitamin D deficiency   . DM (diabetes mellitus) ([place]) 03/12/2013  .  Hypothyroidism   . Constipation, chronic   . Vitamin B 12 deficiency   . ALLERGIC RHINITIS 12/31/2010  . BARRETTS ESOPHAGUS 12/31/2010  . Gastroparesis 12/31/2010  . Bilateral chronic knee pain 12/31/2010  . Obstructive sleep  apnea 09/27/2010  . Migraine 09/27/2010  . RESPIRATORY FAILURE, CHRONIC 09/27/2010  . Cyst of ovary 10/19/2009   PCP:  Janora Norlander, DO Pharmacy:   [place], [place] [address] Phone: [phone] Fax: [phone]     Social Determinants of Health (SDOH) Interventions    Readmission Risk Interventions No flowsheet data found.

## 2019-05-19 NOTE — Discharge Summary (Signed)
Physician Discharge Summary  Patient ID: Morgan [name] MRN: [id number] DOB/AGE: December 31, 1965 53 y.o.  Admit date: 05/17/2019 Discharge date: 05/19/2019  Admission Diagnoses: lumbar stenosis   Discharge Diagnoses: same   Discharged Condition: good  Hospital Course: The patient was admitted on 05/17/2019 and taken to the operating room where the patient underwent lumbar fusion. The patient tolerated the procedure well and was taken to the recovery room and then to the floor in stable condition. The hospital course was routine. There were no complications. The wound remained clean dry and intact. Pt had appropriate back soreness. No complaints of leg pain or new N/T/W. The patient remained afebrile with stable vital signs, and tolerated a regular diet. The patient continued to increase activities, and pain was well controlled with oral pain medications.   Consults: None  Significant Diagnostic Studies:  Results for orders placed or performed during the hospital encounter of 05/17/19  Glucose, capillary  Result Value Ref Range   Glucose-Capillary 88 70 - 99 mg/dL   Comment 1 Notify RN    Comment 2 Document in Chart   Glucose, capillary  Result Value Ref Range   Glucose-Capillary 116 (H) 70 - 99 mg/dL  CBC  Result Value Ref Range   WBC 2.3 (L) 4.0 - 10.5 K/uL   RBC 3.34 (L) 3.87 - 5.11 MIL/uL   Hemoglobin 10.2 (L) 12.0 - 15.0 g/dL   HCT 31.2 (L) 36.0 - 46.0 %   MCV 93.4 80.0 - 100.0 fL   MCH 30.5 26.0 - 34.0 pg   MCHC 32.7 30.0 - 36.0 g/dL   RDW 12.7 11.5 - 15.5 %   Platelets 92 (L) 150 - 400 K/uL   nRBC 0.0 0.0 - 0.2 %  Basic Metabolic Panel  Result Value Ref Range   Sodium 141 135 - 145 mmol/L   Potassium 3.8 3.5 - 5.1 mmol/L   Chloride 104 98 - 111 mmol/L   CO2 27 22 - 32 mmol/L   Glucose, Bld 76 70 - 99 mg/dL   BUN 11 6 - 20 mg/dL   Creatinine, Ser 0.58 0.44 - 1.00 mg/dL   Calcium 8.4 (L) 8.9 - 10.3 mg/dL   GFR calc non Af Amer >60 >60 mL/min   GFR calc Af Amer >60 >60  mL/min   Anion gap 10 5 - 15  Protime-INR  Result Value Ref Range   Prothrombin Time 13.4 11.4 - 15.2 seconds   INR 1.0 0.8 - 1.2  APTT  Result Value Ref Range   aPTT 26 24 - 36 seconds  Glucose, capillary  Result Value Ref Range   Glucose-Capillary 123 (H) 70 - 99 mg/dL  CBC  Result Value Ref Range   WBC 2.5 (L) 4.0 - 10.5 K/uL   RBC 2.94 (L) 3.87 - 5.11 MIL/uL   Hemoglobin 9.1 (L) 12.0 - 15.0 g/dL   HCT 27.7 (L) 36.0 - 46.0 %   MCV 94.2 80.0 - 100.0 fL   MCH 31.0 26.0 - 34.0 pg   MCHC 32.9 30.0 - 36.0 g/dL   RDW 12.8 11.5 - 15.5 %   Platelets 89 (L) 150 - 400 K/uL   nRBC 0.0 0.0 - 0.2 %  Basic metabolic panel  Result Value Ref Range   Sodium 141 135 - 145 mmol/L   Potassium 3.3 (L) 3.5 - 5.1 mmol/L   Chloride 105 98 - 111 mmol/L   CO2 30 22 - 32 mmol/L   Glucose, Bld 126 (H) 70 - 99 mg/dL  BUN 7 6 - 20 mg/dL   Creatinine, Ser 0.44 0.44 - 1.00 mg/dL   Calcium 8.1 (L) 8.9 - 10.3 mg/dL   GFR calc non Af Amer >60 >60 mL/min   GFR calc Af Amer >60 >60 mL/min   Anion gap 6 5 - 15  Blood gas, arterial  Result Value Ref Range   FIO2 PENDING    Delivery systems NASAL CANNULA    pH, Arterial 7.396 7.350 - 7.450   pCO2 arterial 51.5 (H) 32.0 - 48.0 mmHg   pO2, Arterial 141 (H) 83.0 - 108.0 mmHg   Bicarbonate 30.9 (H) 20.0 - 28.0 mmol/L   Acid-Base Excess 6.2 (H) 0.0 - 2.0 mmol/L   O2 Saturation 98.8 %   Patient temperature 98.6    Collection site LEFT RADIAL    Drawn by [phone]    Sample type ARTERIAL DRAW    Allens test (pass/fail) PASS PASS  Glucose, capillary  Result Value Ref Range   Glucose-Capillary 65 (L) 70 - 99 mg/dL  Glucose, capillary  Result Value Ref Range   Glucose-Capillary 121 (H) 70 - 99 mg/dL    Dg Lumbar Spine 2-3 Views  Result Date: 05/17/2019 CLINICAL DATA:  L4 fusion. EXAM: DG C-ARM 61-120 MIN; LUMBAR SPINE - 2-3 VIEW COMPARISON:  CT 03/19/2019.  MRI 01/16/2019. FINDINGS: Lumbar spine numbered as per prior MRI. L4-L5 posterior and interbody  fusion. Hardware intact. Pedicle screws noted in the region of the upper endplates of the L4 and L5 vertebral bodies. Anatomic alignment. Degenerative change lumbar spine. No acute bony abnormality. Two images obtained. 0 minutes 40 seconds fluoroscopy time utilized. IMPRESSION: L4-L5 posterior and interbody fusion. Hardware intact. Anatomic alignment. Electronically Signed   By: Marcello Moores  Register   On: 05/17/2019 11:48   Dg Chest Portable 1 View  Result Date: 05/19/2019 CLINICAL DATA:  53 year old female with altered mental status. EXAM: PORTABLE CHEST 1 VIEW COMPARISON:  Chest radiograph dated 09/25/2018 FINDINGS: The heart size and mediastinal contours are within normal limits. Both lungs are clear. The visualized skeletal structures are unremarkable. IMPRESSION: No active disease. Electronically Signed   By: Anner Crete M.D.   On: 05/19/2019 02:21   Dg C-arm 1-60 Min  Result Date: 05/17/2019 CLINICAL DATA:  L4 fusion. EXAM: DG C-ARM 61-120 MIN; LUMBAR SPINE - 2-3 VIEW COMPARISON:  CT 03/19/2019.  MRI 01/16/2019. FINDINGS: Lumbar spine numbered as per prior MRI. L4-L5 posterior and interbody fusion. Hardware intact. Pedicle screws noted in the region of the upper endplates of the L4 and L5 vertebral bodies. Anatomic alignment. Degenerative change lumbar spine. No acute bony abnormality. Two images obtained. 0 minutes 40 seconds fluoroscopy time utilized. IMPRESSION: L4-L5 posterior and interbody fusion. Hardware intact. Anatomic alignment. Electronically Signed   By: Marcello Moores  Register   On: 05/17/2019 11:48    Antibiotics:  Anti-infectives (From admission, onward)   Start     Dose/Rate Route Frequency Ordered Stop   05/17/19 1600  ceFAZolin (ANCEF) IVPB 2g/100 mL premix     2 g 200 mL/hr over 30 Minutes Intravenous Every 8 hours 05/17/19 1243 05/18/19 0130   05/17/19 0920  bacitracin 50,000 Units in sodium chloride 0.9 % 500 mL irrigation  Status:  Discontinued       As needed 05/17/19 0920  05/17/19 1058   05/17/19 0600  ceFAZolin (ANCEF) IVPB 2g/100 mL premix     2 g 200 mL/hr over 30 Minutes Intravenous On call to O.R. 05/17/19 0547 05/17/19 0800      Discharge  Exam: Blood pressure 100/68, pulse (!) 58, temperature 98.6 F (37 C), temperature source Oral, resp. rate 16, height 5\' 4"  (1.626 m), weight 62.6 kg, last menstrual period 10/17/2017, SpO2 100 %. Neurologic: Grossly normal Dressing dry  Discharge Medications:   Allergies as of 05/19/2019      Reactions   Ketoconazole Hives, Swelling   SWELLING REACTION UNSPECIFIED    Pravachol [pravastatin Sodium] Shortness Of Breath, Swelling, Anaphylaxis   Throat swelling   Statins Other (See Comments)   Leg cramps   Tape Dermatitis, Other (See Comments)   Steri-Strips   Codeine Hypertension   increased BP      Medication List    TAKE these medications   acetaminophen 500 MG tablet Commonly known as:  TYLENOL Take 2 tablets (1,000 mg total) by mouth every 6 (six) hours as needed for mild pain, moderate pain, fever or headache.   Calcium-Vitamin D-Vitamin K 650-12.5-40 MG-MCG-MCG Chew Chew 1 tablet by mouth 2 (two) times daily.   divalproex 500 MG DR tablet Commonly known as:  DEPAKOTE TAKE 1 TABLET EVERY MORNING AND 2 TABLETS AT BEDTIME   docusate sodium 100 MG capsule Commonly known as:  COLACE Take 1 capsule (100 mg total) by mouth 2 (two) times daily.   fluticasone 50 MCG/ACT nasal spray Commonly known as:  FLONASE Place 2 sprays into both nostrils 2 (two) times daily as needed for allergies.   Fremanezumab-vfrm 225 MG/1.5ML Sosy Commonly known as:  Ajovy Inject 140 mg into the skin every 30 (thirty) days.   gabapentin 300 MG capsule Commonly known as:  NEURONTIN Take 2 tabs PO in the AM, 1 tab at lunch and 2 tabs at bedtime. What changed:    how much to take  how to take this  when to take this  additional instructions   GLUCAGON EMERGENCY IJ Inject 1 Syringe as directed as needed  (emergency low blood sugar).   glucose blood test strip Commonly known as:  ONE TOUCH ULTRA TEST CHECK BLOOD SUGAR FOUR TIMES A DAY.  Dx E16.2 E11.649   levothyroxine 112 MCG tablet Commonly known as:  SYNTHROID TAKE 1 TABLET BEFORE BREAKFAST   loratadine 10 MG tablet Commonly known as:  CLARITIN Take 10 mg by mouth daily as needed for allergies.   multivitamin capsule Take 1 capsule by mouth daily.   nystatin powder Commonly known as:  Nyamyc APPLY TOPICALLY TWICE A DAY AS NEEDED What changed:    how much to take  how to take this  when to take this  reasons to take this  additional instructions   ondansetron 4 MG tablet Commonly known as:  Zofran Take 1 tablet (4 mg total) by mouth every 8 (eight) hours.   oxyCODONE 5 MG immediate release tablet Commonly known as:  Oxy IR/ROXICODONE Take 1 tablet (5 mg total) by mouth every 4 (four) hours as needed for moderate pain. What changed:  when to take this   pantoprazole 40 MG tablet Commonly known as:  PROTONIX Take 1 tablet (40 mg total) by mouth daily.   Pazeo 0.7 % Soln Generic drug:  Olopatadine HCl Place 1 drop into both eyes every morning.   polyethylene glycol 17 g packet Commonly known as:  MIRALAX / GLYCOLAX Take 17 g by mouth at bedtime.   PROBIOTIC DAILY PO Take 1 capsule by mouth daily.   Restasis Multidose 0.05 % ophthalmic emulsion Generic drug:  cycloSPORINE Place 1 drop into both eyes 2 (two) times daily.   sertraline  100 MG tablet Commonly known as:  ZOLOFT Take 2 tablets (200 mg total) by mouth daily.   tiZANidine 2 MG tablet Commonly known as:  ZANAFLEX Take 1 tablet (2 mg total) by mouth every 8 (eight) hours as needed for muscle spasms.   ziprasidone 60 MG capsule Commonly known as:  GEODON Take 1 capsule (60 mg total) by mouth at bedtime.       Disposition: home   Final Dx: lumbar fusion  Discharge Instructions    Call MD for:  difficulty breathing, headache or visual  disturbances   Complete by:  As directed    Call MD for:  persistant nausea and vomiting   Complete by:  As directed    Call MD for:  redness, tenderness, or signs of infection (pain, swelling, redness, odor or green/yellow discharge around incision site)   Complete by:  As directed    Call MD for:  severe uncontrolled pain   Complete by:  As directed    Call MD for:  temperature >100.4   Complete by:  As directed    Diet - low sodium heart healthy   Complete by:  As directed    Increase activity slowly   Complete by:  As directed    Remove dressing in 48 hours   Complete by:  As directed       Follow-up Information    Consuella Lose, MD. Schedule an appointment as soon as possible for a visit in 2 week(s).   Specialty:  Neurosurgery Contact information: [address] [place] [address] [phone]            Signed: Eustace Moore 05/19/2019, 8:53 AM

## 2019-05-19 NOTE — Significant Event (Addendum)
Rapid Response Event Note  Overview: Called d/t BP-83/48, pt lethargic. Per RN, pt dropped BP yesterday as well but was not lethargic. NS @ 75 was started at that time. Time Called: 0050 Arrival Time: 0058 Event Type: Neurologic, Hypotension  Initial Focused Assessment: Pt laying in bed with eyes closed, is arousable to verbal stimulation, is alert and oriented, yet very lethargic. Pt will move all extremities, answer questions, and follow commands.  Pt goes back to sleep very easily with no stimulation. Pupils 5 and sluggish. Lung clear, skin warm and dry. Lumbar incision WNL. T-98.6, HR-61, BP-83/49, RR-15, SpO2-99% on RA. Pt placed on continuous SpO2 monitor and when pt goes to sleep, breathing is very shallow and SpO2 drops to 80s(pt has h/o OSA).    Interventions: New PIV(current one infiltrated) 500cc NS bolus x1 Continuous SpO2 monitoring Nasal Cannula  CBG-65>D50 CBC/BMP PCXR ABG CPAP at night EKG-SB  Plan of Care (if not transferred): Treat hypoglycemia. Recheck CBG. Recheck BP after bolus given. Continuous pulse ox monitoring, RT to place CPAP and draw ABG.  Await results of interventions and notify PCP of abnormalities. Event Summary: Name of Physician Notified: (bedside RN to contact PCP) at      at  Update: Glenford Peers, NP notified at 0130  Update: 36- SBP-70s, additional 500cc NS bolus ordered Update: 0305-SBP-70s, Dr. Gorden Harms to bedside to assess pt. Additional 1L [place] bolus ordered. Plan: Move to ICU if BP still low after bolus.       Dillard Essex

## 2019-05-19 NOTE — Plan of Care (Signed)

## 2019-05-19 NOTE — Progress Notes (Signed)
Discharge paperwork and instructions given to pt. Pt not in distress and tolerated well. 

## 2019-05-19 NOTE — Progress Notes (Signed)
Hypoglycemic Event  CBG:65  Treatment: D50 50 mL (25 gm)  Symptoms: None  Follow-up CBG: Time:0227 CBG Result:121  Possible Reasons for Event: Unknown  Comments/MD notified:Kimbery Meyran, NP    Dillard Essex

## 2019-05-21 ENCOUNTER — Encounter: Payer: Self-pay | Admitting: Neurology

## 2019-05-21 ENCOUNTER — Telehealth: Payer: Self-pay | Admitting: Neurology

## 2019-05-21 LAB — BLOOD GAS, ARTERIAL
Acid-Base Excess: 6.2 mmol/L — ABNORMAL HIGH (ref 0.0–2.0)
Bicarbonate: 30.9 mmol/L — ABNORMAL HIGH (ref 20.0–28.0)
Drawn by: 44166
O2 Saturation: 98.8 %
Patient temperature: 98.6
pCO2 arterial: 51.5 mmHg — ABNORMAL HIGH (ref 32.0–48.0)
pH, Arterial: 7.396 (ref 7.350–7.450)
pO2, Arterial: 141 mmHg — ABNORMAL HIGH (ref 83.0–108.0)

## 2019-05-21 NOTE — Telephone Encounter (Signed)
Noted thanks °

## 2019-05-21 NOTE — Telephone Encounter (Signed)
Spoke with pt. Confirmed pt using 2 identifiers. She provided updates to her chart. Had lumbar disc repair on 05/17/2019. Meds updated. Pt taking Oxy/APAP 7.5-325 mg and Robaxin post-surgery. She is taking Ajovy as well. Pt understands she will receive a call to check-in 30 minutes prior to telephone appt tomorrow and then will receive a call from Dr. Jaynee Eagles at 2:00 pm give or take a few minutes. She verbalized appreciation.

## 2019-05-21 NOTE — Telephone Encounter (Signed)
Pt gives consent for telephone visit for her appt 05/22/2019 @ 2pm, Pt gives consent to bill insurance also.  Best # to call - [phone]

## 2019-05-22 ENCOUNTER — Other Ambulatory Visit: Payer: Self-pay

## 2019-05-22 ENCOUNTER — Ambulatory Visit (INDEPENDENT_AMBULATORY_CARE_PROVIDER_SITE_OTHER): Payer: Medicare Other | Admitting: Neurology

## 2019-05-22 DIAGNOSIS — E559 Vitamin D deficiency, unspecified: Secondary | ICD-10-CM | POA: Diagnosis not present

## 2019-05-22 DIAGNOSIS — E1143 Type 2 diabetes mellitus with diabetic autonomic (poly)neuropathy: Secondary | ICD-10-CM | POA: Diagnosis not present

## 2019-05-22 DIAGNOSIS — G4733 Obstructive sleep apnea (adult) (pediatric): Secondary | ICD-10-CM | POA: Diagnosis not present

## 2019-05-22 DIAGNOSIS — M170 Bilateral primary osteoarthritis of knee: Secondary | ICD-10-CM | POA: Diagnosis not present

## 2019-05-22 DIAGNOSIS — E039 Hypothyroidism, unspecified: Secondary | ICD-10-CM | POA: Diagnosis not present

## 2019-05-22 DIAGNOSIS — G43719 Chronic migraine without aura, intractable, without status migrainosus: Secondary | ICD-10-CM

## 2019-05-22 DIAGNOSIS — I9589 Other hypotension: Secondary | ICD-10-CM | POA: Diagnosis not present

## 2019-05-22 DIAGNOSIS — E538 Deficiency of other specified B group vitamins: Secondary | ICD-10-CM | POA: Diagnosis not present

## 2019-05-22 DIAGNOSIS — Z9181 History of falling: Secondary | ICD-10-CM | POA: Diagnosis not present

## 2019-05-22 DIAGNOSIS — Z4789 Encounter for other orthopedic aftercare: Secondary | ICD-10-CM | POA: Diagnosis not present

## 2019-05-22 DIAGNOSIS — I10 Essential (primary) hypertension: Secondary | ICD-10-CM | POA: Diagnosis not present

## 2019-05-22 DIAGNOSIS — K219 Gastro-esophageal reflux disease without esophagitis: Secondary | ICD-10-CM | POA: Diagnosis not present

## 2019-05-22 DIAGNOSIS — Z9884 Bariatric surgery status: Secondary | ICD-10-CM | POA: Diagnosis not present

## 2019-05-22 DIAGNOSIS — K5909 Other constipation: Secondary | ICD-10-CM | POA: Diagnosis not present

## 2019-05-22 DIAGNOSIS — Z981 Arthrodesis status: Secondary | ICD-10-CM | POA: Diagnosis not present

## 2019-05-22 DIAGNOSIS — K3184 Gastroparesis: Secondary | ICD-10-CM | POA: Diagnosis not present

## 2019-05-22 DIAGNOSIS — E1169 Type 2 diabetes mellitus with other specified complication: Secondary | ICD-10-CM | POA: Diagnosis not present

## 2019-05-22 DIAGNOSIS — J9610 Chronic respiratory failure, unspecified whether with hypoxia or hypercapnia: Secondary | ICD-10-CM | POA: Diagnosis not present

## 2019-05-22 DIAGNOSIS — Z79891 Long term (current) use of opiate analgesic: Secondary | ICD-10-CM | POA: Diagnosis not present

## 2019-05-22 DIAGNOSIS — E7849 Other hyperlipidemia: Secondary | ICD-10-CM | POA: Diagnosis not present

## 2019-05-22 DIAGNOSIS — K2270 Barrett's esophagus without dysplasia: Secondary | ICD-10-CM | POA: Diagnosis not present

## 2019-05-22 NOTE — Progress Notes (Signed)
[facility]    Provider:  Dr Jaynee Eagles Referring Provider: Janora Norlander, DO Primary Care Physician:  Janora Norlander, DO  CC:  Migraines  Virtual Visit via Telephone Note  I connected with Morgan [name] on 05/26/19 at  2:00 PM EDT by telephone and verified that I am speaking with the correct person using two identifiers.  Location: Patient: home Provider: office   I discussed the limitations, risks, security and privacy concerns of performing an evaluation and management service by telephone and the availability of in person appointments. I also discussed with the patient that there may be a patient responsible charge related to this service. The patient expressed understanding and agreed to proceed.  Follow Up Instructions:    I discussed the assessment and treatment plan with the patient. The patient was provided an opportunity to ask questions and all were answered. The patient agreed with the plan and demonstrated an understanding of the instructions.   The patient was advised to call back or seek an in-person evaluation if the symptoms worsen or if the condition fails to improve as anticipated.  I provided 22 minutes of non-face-to-face time during this encounter.   Melvenia Beam, MD    Interval history: 24 headache days a month. Most are migraines. 15 are severe. Ajovy may be helping a little, she has had so much going on she doesn't know if that is contributing. She had emergency surgery in April and recent back surgery. She is taking oxycontin and 325mg  tylenol. She will keep on the Ajovy and when all the issues are calmed down she will call us and discuss nerve blocks.   HPI:  Morgan [name] is a 53 y.o. female here for follow up of migraines.  She has a past medical history of Barrett's esophagus, bipolar affective disorder, community-acquired pneumonia in August of this year, chronic respiratory failure, constipation, diabetes, dry  eyes, gastroparesis, GERD, history of bariatric surgery, hyperlipidemia, hypertension, intractable chronic migraine without aura, obstructive sleep apnea, hypothyroidism, B12 deficiency, vitamin D deficiency.    This is the first time I am seeing her.  She has been a patient of our practice and is come to me today for another opinion.  Patient has a history of chronic migraines.  She has tried and failed multiple medications.  Initially she was started on Aimovig and her headaches improved.  She had 2 falls in August 2019 she went to the hospital and CT scan of the head was unremarkable.  Since then she has daily headaches.  She had minimal benefit with a steroid Dosepak.  Her headaches typically start around midday occurs all over the head typically a squeezing pounding sensation.  She reports associated photophobia and phonophobia as well as nausea.  She does not have auras with most migraines.  However she does sometimes see spots in the right eye.  Unclear what this is if it is an aura or not.  She also reports associated dizziness.  She takes tramadol every 6 hours if she does not take tramadol she takes Tylenol.  She also had gastric bypass and she has lost a significant amount of weight, feeding tube had to be placed because of malnutrition.  Initially after Aimovig she had a significant decrease in her headaches from having a daily headache down to 10 days a month.  The second month it improved to 8 headaches a month from daily. She is still on the aimovig. Has daily hedaches. She is not  overtaking tylenol or tramadol. They are daily migraines. She wakes with the headaches. MRI of the brain was normal. Pressure/pulsating/pounding. Light and sound sensitivity. Migraines are moderately severe to severe. No medication overuse. Movement makes it worse. +nausea. She is compliant. Her levels of depakote and gabapentin were fine. She has a detailed migraine diary. Last 24 hours - 72 hours.   Meds tried:  Gabapentin, depakote, tizanidine, zoloft, botox for migraine x 4. Aimovig x 4. And many other meds.   Review of Systems: Patient complains of symptoms per HPI as well as the following symptoms: headaches. Pertinent negatives and positives per HPI. All others negative.   Social History   Socioeconomic History  . Marital status: Single    Spouse name: Not on file  . Number of children: 0  . Years of education: HS  . Highest education level: 12th grade  Occupational History  . Occupation: disabled    Fish farm manager: UNEMPLOYED  Social Needs  . Financial resource strain: Not hard at all  . Food insecurity:    Worry: Never true    Inability: Never true  . Transportation needs:    Medical: No    Non-medical: No  Tobacco Use  . Smoking status: Never Smoker  . Smokeless tobacco: Never Used  Substance and Sexual Activity  . Alcohol use: No  . Drug use: No  . Sexual activity: Never    Birth control/protection: None  Lifestyle  . Physical activity:    Days per week: 0 days    Minutes per session: 0 min  . Stress: Not at all  Relationships  . Social connections:    Talks on phone: More than three times a week    Gets together: More than three times a week    Attends religious service: More than 4 times per year    Active member of club or organization: No    Attends meetings of clubs or organizations: Never    Relationship status: Never married  . Intimate partner violence:    Fear of current or ex partner: No    Emotionally abused: No    Physically abused: No    Forced sexual activity: No  Other Topics Concern  . Not on file  Social History Narrative   Patient is right handed.   Patient drinks 2 glasses of caffeine daily.   Lives at home. Her niece is living with her right now.    Family History  Problem Relation Age of Onset  . Asthma Father   . Allergies Father   . Heart disease Father        enlarged heart  . Peripheral vascular disease Father   . Diabetes Father    . Hyperlipidemia Father   . Arthritis Father   . Asthma Sister   . Cancer Sister        colon at 26 yr old.  . Colon cancer Sister   . Allergies Mother   . Stroke Mother 41       with hemi paralysis  . Diabetes Mother   . Hyperlipidemia Mother   . Hypertension Mother   . GI problems Mother   . Arthritis Mother   . Allergies Brother   . Early death Brother 9       congenital abormality  . Allergies Sister   . Diabetes Sister   . Asthma Sister   . Colon polyps Sister   . Hyperlipidemia Sister   . GI problems Sister  gastroporesis   . Liver disease Sister        fatty liver  . Stroke Sister        intercrandial bleed  . Diabetes Brother   . Hypertension Brother   . Hyperlipidemia Brother   . Heart disease Paternal Aunt   . Migraines Neg Hx     Past Medical History:  Diagnosis Date  . Allergic rhinitis   . Anemia   . Anxiety   . Barrett's esophagus   . Bipolar affective disorder ([place])   . CAP (community acquired pneumonia) 07/20/2018  . Chronic respiratory failure ([place])   . Constipation, chronic   . Degenerative joint disease of spine   . Depression   . Diabetes mellitus without complication ([place])    type 2   . Dry eye   . Family history of adverse reaction to anesthesia    nausea and vomiting  . Gastroparesis   . GERD (gastroesophageal reflux disease)   . History of bariatric surgery 03/2017  . History of hiatal hernia   . HLD (hyperlipidemia) 03/12/2013  . Hyperlipidemia   . Hypertension   . Intractable chronic migraine without aura 06/04/2015  . Migraine headache   . Morbid obesity ([place])   . OSA (obstructive sleep apnea)    No cpap since gastric surgery  . Osteoarthritis    bilateral knee  . SBO (small bowel obstruction) ([place]) 03/19/2019  . Sleep apnea   . Unspecified hypothyroidism   . Vitamin B 12 deficiency   . Vitamin D deficiency     Past Surgical History:  Procedure Laterality Date  . APPENDECTOMY  1978  . CHOLECYSTECTOMY  2005  .  COLONOSCOPY    . disc repair  05/17/2019   with rods, lumbar spine  . GASTRIC ROUX-EN-Y N/A 03/21/2017   Procedure: LAPAROSCOPIC ROUX-EN-Y GASTRIC, UPPER ENDO;  Surgeon: Greer Pickerel, MD;  Location: [facility];  Service: General;  Laterality: N/A;  . GASTROSTOMY N/A 06/19/2018   Procedure: LAPRASCOPIC INSERTION OF GASTROSTOMY TUBE;  Surgeon: Greer Pickerel, MD;  Location: [facility];  Service: General;  Laterality: N/A;  . GASTROSTOMY TUBE PLACEMENT Left    06/2018  . HERNIA REPAIR    . HIATAL HERNIA REPAIR N/A 06/19/2018   Procedure: LAPAROSCOPIC REPAIR OF HIATAL HERNIA;  Surgeon: Greer Pickerel, MD;  Location: [facility];  Service: General;  Laterality: N/A;  . LAPAROSCOPIC LYSIS OF ADHESIONS  03/19/2019   Dr. Romana Juniper  . LAPAROSCOPY N/A 06/19/2018   Procedure: LAPAROSCOPY DIAGNOSTIC;  Surgeon: Greer Pickerel, MD;  Location: [facility];  Service: General;  Laterality: N/A;  . LAPAROSCOPY N/A 03/19/2019   Procedure: LAPAROSCOPY DIAGNOSTIC lysis of adhesions;  Surgeon: Clovis Riley, MD;  Location: [facility];  Service: General;  Laterality: N/A;  . LUMBAR LAMINECTOMY/DECOMPRESSION MICRODISCECTOMY Right 10/11/2018   Procedure: LAMINECTOMY AND FORAMINOTOMY RIGHT LUMBAR FOUR- LUMBAR FIVE;  Surgeon: Consuella Lose, MD;  Location: [facility];  Service: Neurosurgery;  Laterality: Right;  . SHOULDER ARTHROSCOPY  7/12   left-dsc  . TONSILLECTOMY  at age 47  . TRIGGER FINGER RELEASE  12/20/2012   Procedure: RELEASE TRIGGER FINGER/A-1 PULLEY;  Surgeon: Tennis Must, MD;  Location: [facility];  Service: Orthopedics;  Laterality: Left;  LEFT TRIGGER THUMB RELEASE    Current Outpatient Medications  Medication Sig Dispense Refill  . Calcium-Vitamin D-Vitamin K 650-12.5-40 MG-MCG-MCG CHEW Chew 1 tablet by mouth 2 (two) times daily.    . divalproex (DEPAKOTE) 500 MG DR tablet TAKE 1 TABLET  EVERY MORNING AND 2 TABLETS AT BEDTIME 90 tablet 5  . docusate sodium (COLACE) 100 MG capsule Take 1 capsule (100 mg total) by  mouth 2 (two) times daily. 30 capsule 0  . fluticasone (FLONASE) 50 MCG/ACT nasal spray Place 2 sprays into both nostrils 2 (two) times daily as needed for allergies. 16 g 4  . Fremanezumab-vfrm (AJOVY) 225 MG/1.5ML SOSY Inject 140 mg into the skin every 30 (thirty) days. 1 Syringe 11  . gabapentin (NEURONTIN) 300 MG capsule Take 2 tabs PO in the AM, 1 tab at lunch and 2 tabs at bedtime. (Patient taking differently: Take 300-600 mg by mouth See admin instructions. Take 600mg  by mouth in the morning, 300mg  at lunch and 600mg  at bedtime.) 450 capsule 3  . Glucagon, rDNA, (GLUCAGON EMERGENCY IJ) Inject 1 Syringe as directed as needed (emergency low blood sugar).    Marland Kitchen glucose blood (ONE TOUCH ULTRA TEST) test strip CHECK BLOOD SUGAR FOUR TIMES A DAY.  Dx E16.2 E11.649 400 each 3  . levothyroxine (SYNTHROID) 112 MCG tablet TAKE 1 TABLET BEFORE BREAKFAST 90 tablet 1  . loratadine (CLARITIN) 10 MG tablet Take 10 mg by mouth daily as needed for allergies.     . methocarbamol (ROBAXIN) 750 MG tablet Take 750 mg by mouth 3 (three) times daily.    . Multiple Vitamin (MULTIVITAMIN) capsule Take 1 capsule by mouth daily.     Marland Kitchen nystatin (NYAMYC) powder APPLY TOPICALLY TWICE A DAY AS NEEDED (Patient taking differently: Apply 1 g topically 2 (two) times daily as needed (yeast). ) 30 g 0  . ondansetron (ZOFRAN) 4 MG tablet Take 1 tablet (4 mg total) by mouth every 8 (eight) hours. 90 tablet 11  . oxyCODONE-acetaminophen (PERCOCET) 7.5-325 MG tablet Take 1 tablet by mouth every 4 (four) hours.    . pantoprazole (PROTONIX) 40 MG tablet Take 1 tablet (40 mg total) by mouth daily. 90 tablet 1  . PAZEO 0.7 % SOLN Place 1 drop into both eyes every morning.    . polyethylene glycol (MIRALAX / GLYCOLAX) packet Take 17 g by mouth at bedtime.     . Probiotic Product (PROBIOTIC DAILY PO) Take 1 capsule by mouth daily.    . RESTASIS MULTIDOSE 0.05 % ophthalmic emulsion Place 1 drop into both eyes 2 (two) times daily.    .  sertraline (ZOLOFT) 100 MG tablet Take 2 tablets (200 mg total) by mouth daily. 180 tablet 3  . tiZANidine (ZANAFLEX) 2 MG tablet Take 1 tablet (2 mg total) by mouth every 8 (eight) hours as needed for muscle spasms. (Patient not taking: Reported on 05/21/2019) 60 tablet 0  . ziprasidone (GEODON) 60 MG capsule Take 1 capsule (60 mg total) by mouth at bedtime. 90 capsule 1   No current facility-administered medications for this visit.     Allergies as of 05/22/2019 - Review Complete 05/21/2019  Allergen Reaction Noted  . Ketoconazole Hives and Swelling   . Pravachol [pravastatin sodium] Shortness Of Breath, Swelling, and Anaphylaxis 11/13/2013  . Statins Other (See Comments)   . Tape Dermatitis and Other (See Comments)   . Codeine Hypertension     Vitals: LMP 10/17/2017 (Approximate)  Last Weight:  Wt Readings from Last 1 Encounters:  05/17/19 138 lb (62.6 kg)   Last Height:   Ht Readings from Last 1 Encounters:  05/17/19 5\' 4"  (1.626 m)   Physical exam: Exam: Gen: NAD, conversant, well nourised, thin, well groomed  CV: RRR, no MRG. No Carotid Bruits. No peripheral edema, warm, nontender Eyes: Conjunctivae clear without exudates or hemorrhage  Neuro: Detailed Neurologic Exam  Speech:    Speech is normal; fluent and spontaneous with normal comprehension.  Cognition:    The patient is oriented to person, place, and time;     recent and remote memory intact;     language fluent;     normal attention, concentration,     fund of knowledge Cranial Nerves:    The pupils are equal, round, and reactive to light. The fundi are normal and spontaneous venous pulsations are present. Visual fields are full to finger confrontation. Extraocular movements are intact. Trigeminal sensation is intact and the muscles of mastication are normal. The face is symmetric. The palate elevates in the midline. Hearing intact. Voice is normal. Shoulder shrug is normal. The tongue has  normal motion without fasciculations.   Coordination:    Normal finger to nose and heel to shin. Normal rapid alternating movements.   Gait:  antalgic  Motor Observation:     no involuntary movements noted. Tone:    Normal muscle tone.    Posture:    Posture is normal. normal erect    Strength:    Strength is V/V in the upper and lower limbs.      Sensation: intact to LT        Assessment/Plan: Lovely 53 year old with chronic intractable migraines  - Stop Aimovig due to failure and Start Ajovy - No doing well 24 headache days a month. Most are migraines. 15 are severe. Ajovy may be helping a little, she has had so much going on she doesn't know if that is contributing. She had emergency surgery in April and recent back surgery. She is taking oxycontin and 325mg  tylenol. She will keep on the Ajovy and when all the issues are calmed down she will call us and discuss nerve blocks.  Discussed again: To prevent or relieve headaches, try the following: Cool Compress. Lie down and place a cool compress on your head.  Avoid headache triggers. If certain foods or odors seem to have triggered your migraines in the past, avoid them. A headache diary might help you identify triggers.  Include physical activity in your daily routine. Try a daily walk or other moderate aerobic exercise.  Manage stress. Find healthy ways to cope with the stressors, such as delegating tasks on your to-do list.  Practice relaxation techniques. Try deep breathing, yoga, massage and visualization.  Eat regularly. Eating regularly scheduled meals and maintaining a healthy diet might help prevent headaches. Also, drink plenty of fluids.  Follow a regular sleep schedule. Sleep deprivation might contribute to headaches Consider biofeedback. With this mind-body technique, you learn to control certain bodily functions - such as muscle tension, heart rate and blood pressure - to prevent headaches or reduce headache pain.     Proceed to emergency room if you experience new or worsening symptoms or symptoms do not resolve, if you have new neurologic symptoms or if headache is severe, or for any concerning symptom.   Provided education and documentation from American headache Society toolbox including articles on: chronic migraine medication overuse headache, chronic migraines, prevention of migraines, behavioral and other nonpharmacologic treatments for headache.   Sarina Ill, MD  [facility] [address] [place], [place] [address]  Phone [phone] Fax [phone]

## 2019-05-23 DIAGNOSIS — E7849 Other hyperlipidemia: Secondary | ICD-10-CM | POA: Diagnosis not present

## 2019-05-23 DIAGNOSIS — G4733 Obstructive sleep apnea (adult) (pediatric): Secondary | ICD-10-CM | POA: Diagnosis not present

## 2019-05-23 DIAGNOSIS — I9589 Other hypotension: Secondary | ICD-10-CM | POA: Diagnosis not present

## 2019-05-23 DIAGNOSIS — E559 Vitamin D deficiency, unspecified: Secondary | ICD-10-CM | POA: Diagnosis not present

## 2019-05-23 DIAGNOSIS — K219 Gastro-esophageal reflux disease without esophagitis: Secondary | ICD-10-CM | POA: Diagnosis not present

## 2019-05-23 DIAGNOSIS — K3184 Gastroparesis: Secondary | ICD-10-CM | POA: Diagnosis not present

## 2019-05-23 DIAGNOSIS — Z981 Arthrodesis status: Secondary | ICD-10-CM | POA: Diagnosis not present

## 2019-05-23 DIAGNOSIS — E538 Deficiency of other specified B group vitamins: Secondary | ICD-10-CM | POA: Diagnosis not present

## 2019-05-23 DIAGNOSIS — M170 Bilateral primary osteoarthritis of knee: Secondary | ICD-10-CM | POA: Diagnosis not present

## 2019-05-23 DIAGNOSIS — K2270 Barrett's esophagus without dysplasia: Secondary | ICD-10-CM | POA: Diagnosis not present

## 2019-05-23 DIAGNOSIS — Z9181 History of falling: Secondary | ICD-10-CM | POA: Diagnosis not present

## 2019-05-23 DIAGNOSIS — Z4789 Encounter for other orthopedic aftercare: Secondary | ICD-10-CM | POA: Diagnosis not present

## 2019-05-23 DIAGNOSIS — Z9884 Bariatric surgery status: Secondary | ICD-10-CM | POA: Diagnosis not present

## 2019-05-23 DIAGNOSIS — K5909 Other constipation: Secondary | ICD-10-CM | POA: Diagnosis not present

## 2019-05-23 DIAGNOSIS — I10 Essential (primary) hypertension: Secondary | ICD-10-CM | POA: Diagnosis not present

## 2019-05-23 DIAGNOSIS — G43719 Chronic migraine without aura, intractable, without status migrainosus: Secondary | ICD-10-CM | POA: Diagnosis not present

## 2019-05-23 DIAGNOSIS — Z79891 Long term (current) use of opiate analgesic: Secondary | ICD-10-CM | POA: Diagnosis not present

## 2019-05-23 DIAGNOSIS — E039 Hypothyroidism, unspecified: Secondary | ICD-10-CM | POA: Diagnosis not present

## 2019-05-23 DIAGNOSIS — E1143 Type 2 diabetes mellitus with diabetic autonomic (poly)neuropathy: Secondary | ICD-10-CM | POA: Diagnosis not present

## 2019-05-23 DIAGNOSIS — E1169 Type 2 diabetes mellitus with other specified complication: Secondary | ICD-10-CM | POA: Diagnosis not present

## 2019-05-23 DIAGNOSIS — J9610 Chronic respiratory failure, unspecified whether with hypoxia or hypercapnia: Secondary | ICD-10-CM | POA: Diagnosis not present

## 2019-05-24 ENCOUNTER — Telehealth (HOSPITAL_COMMUNITY): Payer: Self-pay | Admitting: Rehabilitation

## 2019-05-24 ENCOUNTER — Other Ambulatory Visit: Payer: Self-pay

## 2019-05-24 ENCOUNTER — Ambulatory Visit (HOSPITAL_COMMUNITY)
Admission: RE | Admit: 2019-05-24 | Discharge: 2019-05-24 | Disposition: A | Discharge status: Home / Self Care | Payer: Medicare Other | Source: Ambulatory Visit | Attending: Family | Admitting: Family

## 2019-05-24 ENCOUNTER — Other Ambulatory Visit (HOSPITAL_COMMUNITY): Payer: Self-pay | Admitting: Physician Assistant

## 2019-05-24 DIAGNOSIS — M7989 Other specified soft tissue disorders: Secondary | ICD-10-CM | POA: Insufficient documentation

## 2019-05-24 NOTE — Telephone Encounter (Signed)
The above patient or their representative was contacted and gave the following answers to these questions:         Do you have any of the following symptoms? No Fever                    Cough                   Shortness of breath  Do  you have any of the following other symptoms? No  muscle pain         vomiting,        diarrhea        rash         weakness        red eye        abdominal pain         bruising         bleeding              joint pain           severe headache  Have you been in contact with someone who was or has been sick in the past 2 weeks? No Yes                 Unsure                         Unable to assess   Does the person that you were in contact with have any of the following symptoms?  Cough         shortness of breath           muscle pain         vomiting,            diarrhea            rash            weakness           fever            red eye           abdominal pain          bruising  or  bleeding                joint pain                severe headache             Have you  or someone you have been in contact with traveled internationally in the last month?  No      If yes, which countries?  Have you  or someone you have been in contact with traveled outside [place] in the last month?  No      If yes, which state and city?  COMMENTS OR ACTION PLAN FOR THIS PATIENT:    

## 2019-05-26 ENCOUNTER — Encounter: Payer: Self-pay | Admitting: Neurology

## 2019-05-29 DIAGNOSIS — Z79891 Long term (current) use of opiate analgesic: Secondary | ICD-10-CM | POA: Diagnosis not present

## 2019-05-29 DIAGNOSIS — E1169 Type 2 diabetes mellitus with other specified complication: Secondary | ICD-10-CM | POA: Diagnosis not present

## 2019-05-29 DIAGNOSIS — E7849 Other hyperlipidemia: Secondary | ICD-10-CM | POA: Diagnosis not present

## 2019-05-29 DIAGNOSIS — G43719 Chronic migraine without aura, intractable, without status migrainosus: Secondary | ICD-10-CM | POA: Diagnosis not present

## 2019-05-29 DIAGNOSIS — I10 Essential (primary) hypertension: Secondary | ICD-10-CM | POA: Diagnosis not present

## 2019-05-29 DIAGNOSIS — I9589 Other hypotension: Secondary | ICD-10-CM | POA: Diagnosis not present

## 2019-05-29 DIAGNOSIS — K5909 Other constipation: Secondary | ICD-10-CM | POA: Diagnosis not present

## 2019-05-29 DIAGNOSIS — Z9884 Bariatric surgery status: Secondary | ICD-10-CM | POA: Diagnosis not present

## 2019-05-29 DIAGNOSIS — E538 Deficiency of other specified B group vitamins: Secondary | ICD-10-CM | POA: Diagnosis not present

## 2019-05-29 DIAGNOSIS — K2270 Barrett's esophagus without dysplasia: Secondary | ICD-10-CM | POA: Diagnosis not present

## 2019-05-29 DIAGNOSIS — K219 Gastro-esophageal reflux disease without esophagitis: Secondary | ICD-10-CM | POA: Diagnosis not present

## 2019-05-29 DIAGNOSIS — E039 Hypothyroidism, unspecified: Secondary | ICD-10-CM | POA: Diagnosis not present

## 2019-05-29 DIAGNOSIS — Z4789 Encounter for other orthopedic aftercare: Secondary | ICD-10-CM | POA: Diagnosis not present

## 2019-05-29 DIAGNOSIS — M170 Bilateral primary osteoarthritis of knee: Secondary | ICD-10-CM | POA: Diagnosis not present

## 2019-05-29 DIAGNOSIS — G4733 Obstructive sleep apnea (adult) (pediatric): Secondary | ICD-10-CM | POA: Diagnosis not present

## 2019-05-29 DIAGNOSIS — K3184 Gastroparesis: Secondary | ICD-10-CM | POA: Diagnosis not present

## 2019-05-29 DIAGNOSIS — Z981 Arthrodesis status: Secondary | ICD-10-CM | POA: Diagnosis not present

## 2019-05-29 DIAGNOSIS — E1143 Type 2 diabetes mellitus with diabetic autonomic (poly)neuropathy: Secondary | ICD-10-CM | POA: Diagnosis not present

## 2019-05-29 DIAGNOSIS — Z9181 History of falling: Secondary | ICD-10-CM | POA: Diagnosis not present

## 2019-05-29 DIAGNOSIS — J9610 Chronic respiratory failure, unspecified whether with hypoxia or hypercapnia: Secondary | ICD-10-CM | POA: Diagnosis not present

## 2019-05-29 DIAGNOSIS — E559 Vitamin D deficiency, unspecified: Secondary | ICD-10-CM | POA: Diagnosis not present

## 2019-05-31 DIAGNOSIS — K3184 Gastroparesis: Secondary | ICD-10-CM | POA: Diagnosis not present

## 2019-05-31 DIAGNOSIS — Z9181 History of falling: Secondary | ICD-10-CM | POA: Diagnosis not present

## 2019-05-31 DIAGNOSIS — G4733 Obstructive sleep apnea (adult) (pediatric): Secondary | ICD-10-CM | POA: Diagnosis not present

## 2019-05-31 DIAGNOSIS — K2270 Barrett's esophagus without dysplasia: Secondary | ICD-10-CM | POA: Diagnosis not present

## 2019-05-31 DIAGNOSIS — E7849 Other hyperlipidemia: Secondary | ICD-10-CM | POA: Diagnosis not present

## 2019-05-31 DIAGNOSIS — Z9884 Bariatric surgery status: Secondary | ICD-10-CM | POA: Diagnosis not present

## 2019-05-31 DIAGNOSIS — K5909 Other constipation: Secondary | ICD-10-CM | POA: Diagnosis not present

## 2019-05-31 DIAGNOSIS — J9610 Chronic respiratory failure, unspecified whether with hypoxia or hypercapnia: Secondary | ICD-10-CM | POA: Diagnosis not present

## 2019-05-31 DIAGNOSIS — E1143 Type 2 diabetes mellitus with diabetic autonomic (poly)neuropathy: Secondary | ICD-10-CM | POA: Diagnosis not present

## 2019-05-31 DIAGNOSIS — M170 Bilateral primary osteoarthritis of knee: Secondary | ICD-10-CM | POA: Diagnosis not present

## 2019-05-31 DIAGNOSIS — Z4789 Encounter for other orthopedic aftercare: Secondary | ICD-10-CM | POA: Diagnosis not present

## 2019-05-31 DIAGNOSIS — I9589 Other hypotension: Secondary | ICD-10-CM | POA: Diagnosis not present

## 2019-05-31 DIAGNOSIS — K219 Gastro-esophageal reflux disease without esophagitis: Secondary | ICD-10-CM | POA: Diagnosis not present

## 2019-05-31 DIAGNOSIS — I10 Essential (primary) hypertension: Secondary | ICD-10-CM | POA: Diagnosis not present

## 2019-05-31 DIAGNOSIS — Z79891 Long term (current) use of opiate analgesic: Secondary | ICD-10-CM | POA: Diagnosis not present

## 2019-05-31 DIAGNOSIS — E538 Deficiency of other specified B group vitamins: Secondary | ICD-10-CM | POA: Diagnosis not present

## 2019-05-31 DIAGNOSIS — Z981 Arthrodesis status: Secondary | ICD-10-CM | POA: Diagnosis not present

## 2019-05-31 DIAGNOSIS — G43719 Chronic migraine without aura, intractable, without status migrainosus: Secondary | ICD-10-CM | POA: Diagnosis not present

## 2019-05-31 DIAGNOSIS — E1169 Type 2 diabetes mellitus with other specified complication: Secondary | ICD-10-CM | POA: Diagnosis not present

## 2019-05-31 DIAGNOSIS — E039 Hypothyroidism, unspecified: Secondary | ICD-10-CM | POA: Diagnosis not present

## 2019-05-31 DIAGNOSIS — E559 Vitamin D deficiency, unspecified: Secondary | ICD-10-CM | POA: Diagnosis not present

## 2019-06-03 DIAGNOSIS — M47816 Spondylosis without myelopathy or radiculopathy, lumbar region: Secondary | ICD-10-CM | POA: Diagnosis not present

## 2019-06-06 DIAGNOSIS — M170 Bilateral primary osteoarthritis of knee: Secondary | ICD-10-CM | POA: Diagnosis not present

## 2019-06-06 DIAGNOSIS — I10 Essential (primary) hypertension: Secondary | ICD-10-CM | POA: Diagnosis not present

## 2019-06-06 DIAGNOSIS — Z79891 Long term (current) use of opiate analgesic: Secondary | ICD-10-CM | POA: Diagnosis not present

## 2019-06-06 DIAGNOSIS — Z9181 History of falling: Secondary | ICD-10-CM | POA: Diagnosis not present

## 2019-06-06 DIAGNOSIS — G43719 Chronic migraine without aura, intractable, without status migrainosus: Secondary | ICD-10-CM | POA: Diagnosis not present

## 2019-06-06 DIAGNOSIS — E039 Hypothyroidism, unspecified: Secondary | ICD-10-CM | POA: Diagnosis not present

## 2019-06-06 DIAGNOSIS — E559 Vitamin D deficiency, unspecified: Secondary | ICD-10-CM | POA: Diagnosis not present

## 2019-06-06 DIAGNOSIS — Z4789 Encounter for other orthopedic aftercare: Secondary | ICD-10-CM | POA: Diagnosis not present

## 2019-06-06 DIAGNOSIS — E1143 Type 2 diabetes mellitus with diabetic autonomic (poly)neuropathy: Secondary | ICD-10-CM | POA: Diagnosis not present

## 2019-06-06 DIAGNOSIS — E1169 Type 2 diabetes mellitus with other specified complication: Secondary | ICD-10-CM | POA: Diagnosis not present

## 2019-06-06 DIAGNOSIS — K5909 Other constipation: Secondary | ICD-10-CM | POA: Diagnosis not present

## 2019-06-06 DIAGNOSIS — K2270 Barrett's esophagus without dysplasia: Secondary | ICD-10-CM | POA: Diagnosis not present

## 2019-06-06 DIAGNOSIS — K3184 Gastroparesis: Secondary | ICD-10-CM | POA: Diagnosis not present

## 2019-06-06 DIAGNOSIS — J9610 Chronic respiratory failure, unspecified whether with hypoxia or hypercapnia: Secondary | ICD-10-CM | POA: Diagnosis not present

## 2019-06-06 DIAGNOSIS — Z9884 Bariatric surgery status: Secondary | ICD-10-CM | POA: Diagnosis not present

## 2019-06-06 DIAGNOSIS — K219 Gastro-esophageal reflux disease without esophagitis: Secondary | ICD-10-CM | POA: Diagnosis not present

## 2019-06-06 DIAGNOSIS — G4733 Obstructive sleep apnea (adult) (pediatric): Secondary | ICD-10-CM | POA: Diagnosis not present

## 2019-06-06 DIAGNOSIS — E538 Deficiency of other specified B group vitamins: Secondary | ICD-10-CM | POA: Diagnosis not present

## 2019-06-06 DIAGNOSIS — E7849 Other hyperlipidemia: Secondary | ICD-10-CM | POA: Diagnosis not present

## 2019-06-06 DIAGNOSIS — I9589 Other hypotension: Secondary | ICD-10-CM | POA: Diagnosis not present

## 2019-06-06 DIAGNOSIS — Z981 Arthrodesis status: Secondary | ICD-10-CM | POA: Diagnosis not present

## 2019-06-07 DIAGNOSIS — I10 Essential (primary) hypertension: Secondary | ICD-10-CM | POA: Diagnosis not present

## 2019-06-07 DIAGNOSIS — K2270 Barrett's esophagus without dysplasia: Secondary | ICD-10-CM | POA: Diagnosis not present

## 2019-06-07 DIAGNOSIS — G4733 Obstructive sleep apnea (adult) (pediatric): Secondary | ICD-10-CM | POA: Diagnosis not present

## 2019-06-07 DIAGNOSIS — E7849 Other hyperlipidemia: Secondary | ICD-10-CM | POA: Diagnosis not present

## 2019-06-07 DIAGNOSIS — Z9884 Bariatric surgery status: Secondary | ICD-10-CM | POA: Diagnosis not present

## 2019-06-07 DIAGNOSIS — K219 Gastro-esophageal reflux disease without esophagitis: Secondary | ICD-10-CM | POA: Diagnosis not present

## 2019-06-07 DIAGNOSIS — I9589 Other hypotension: Secondary | ICD-10-CM | POA: Diagnosis not present

## 2019-06-07 DIAGNOSIS — J9610 Chronic respiratory failure, unspecified whether with hypoxia or hypercapnia: Secondary | ICD-10-CM | POA: Diagnosis not present

## 2019-06-07 DIAGNOSIS — E1169 Type 2 diabetes mellitus with other specified complication: Secondary | ICD-10-CM | POA: Diagnosis not present

## 2019-06-07 DIAGNOSIS — E1143 Type 2 diabetes mellitus with diabetic autonomic (poly)neuropathy: Secondary | ICD-10-CM | POA: Diagnosis not present

## 2019-06-07 DIAGNOSIS — Z4789 Encounter for other orthopedic aftercare: Secondary | ICD-10-CM | POA: Diagnosis not present

## 2019-06-07 DIAGNOSIS — E538 Deficiency of other specified B group vitamins: Secondary | ICD-10-CM | POA: Diagnosis not present

## 2019-06-07 DIAGNOSIS — Z981 Arthrodesis status: Secondary | ICD-10-CM | POA: Diagnosis not present

## 2019-06-07 DIAGNOSIS — Z79891 Long term (current) use of opiate analgesic: Secondary | ICD-10-CM | POA: Diagnosis not present

## 2019-06-07 DIAGNOSIS — K3184 Gastroparesis: Secondary | ICD-10-CM | POA: Diagnosis not present

## 2019-06-07 DIAGNOSIS — K5909 Other constipation: Secondary | ICD-10-CM | POA: Diagnosis not present

## 2019-06-07 DIAGNOSIS — M170 Bilateral primary osteoarthritis of knee: Secondary | ICD-10-CM | POA: Diagnosis not present

## 2019-06-07 DIAGNOSIS — E039 Hypothyroidism, unspecified: Secondary | ICD-10-CM | POA: Diagnosis not present

## 2019-06-07 DIAGNOSIS — G43719 Chronic migraine without aura, intractable, without status migrainosus: Secondary | ICD-10-CM | POA: Diagnosis not present

## 2019-06-07 DIAGNOSIS — E559 Vitamin D deficiency, unspecified: Secondary | ICD-10-CM | POA: Diagnosis not present

## 2019-06-07 DIAGNOSIS — Z9181 History of falling: Secondary | ICD-10-CM | POA: Diagnosis not present

## 2019-07-03 DIAGNOSIS — E119 Type 2 diabetes mellitus without complications: Secondary | ICD-10-CM | POA: Diagnosis not present

## 2019-07-03 DIAGNOSIS — H40033 Anatomical narrow angle, bilateral: Secondary | ICD-10-CM | POA: Diagnosis not present

## 2019-07-03 LAB — HM DIABETES EYE EXAM

## 2019-07-15 ENCOUNTER — Other Ambulatory Visit: Payer: Self-pay

## 2019-07-17 ENCOUNTER — Ambulatory Visit (INDEPENDENT_AMBULATORY_CARE_PROVIDER_SITE_OTHER): Payer: Medicare Other | Admitting: *Deleted

## 2019-07-17 ENCOUNTER — Other Ambulatory Visit: Payer: Self-pay

## 2019-07-17 DIAGNOSIS — E538 Deficiency of other specified B group vitamins: Secondary | ICD-10-CM

## 2019-07-17 MED ORDER — CYANOCOBALAMIN 1000 MCG/ML IJ SOLN
1000.0000 ug | INTRAMUSCULAR | Status: AC
  Administered 2019-07-17 – 2020-02-17 (×4): 1000 ug via INTRAMUSCULAR

## 2019-07-17 NOTE — Progress Notes (Signed)
Pt given Cyanocobalamin inj Tolerated well 

## 2019-07-22 ENCOUNTER — Other Ambulatory Visit: Payer: Self-pay | Admitting: Family Medicine

## 2019-07-26 ENCOUNTER — Other Ambulatory Visit: Payer: Self-pay | Admitting: *Deleted

## 2019-08-05 DIAGNOSIS — M25571 Pain in right ankle and joints of right foot: Secondary | ICD-10-CM | POA: Diagnosis not present

## 2019-08-15 ENCOUNTER — Other Ambulatory Visit: Payer: Self-pay

## 2019-08-16 ENCOUNTER — Encounter: Payer: Self-pay | Admitting: Family Medicine

## 2019-08-16 ENCOUNTER — Ambulatory Visit (INDEPENDENT_AMBULATORY_CARE_PROVIDER_SITE_OTHER): Payer: Medicare Other | Admitting: Family Medicine

## 2019-08-16 VITALS — BP 127/82 | HR 78 | Temp 97.7°F | Ht 64.0 in | Wt 154.0 lb

## 2019-08-16 DIAGNOSIS — E039 Hypothyroidism, unspecified: Secondary | ICD-10-CM | POA: Diagnosis not present

## 2019-08-16 DIAGNOSIS — E538 Deficiency of other specified B group vitamins: Secondary | ICD-10-CM

## 2019-08-16 DIAGNOSIS — D72819 Decreased white blood cell count, unspecified: Secondary | ICD-10-CM

## 2019-08-16 DIAGNOSIS — Z9884 Bariatric surgery status: Secondary | ICD-10-CM

## 2019-08-16 DIAGNOSIS — E876 Hypokalemia: Secondary | ICD-10-CM

## 2019-08-16 DIAGNOSIS — D649 Anemia, unspecified: Secondary | ICD-10-CM | POA: Diagnosis not present

## 2019-08-16 DIAGNOSIS — E119 Type 2 diabetes mellitus without complications: Secondary | ICD-10-CM

## 2019-08-16 DIAGNOSIS — F3170 Bipolar disorder, currently in remission, most recent episode unspecified: Secondary | ICD-10-CM

## 2019-08-16 DIAGNOSIS — R0982 Postnasal drip: Secondary | ICD-10-CM

## 2019-08-16 LAB — BAYER DCA HB A1C WAIVED: HB A1C (BAYER DCA - WAIVED): 5.4 % (ref <7.0)

## 2019-08-16 MED ORDER — LEVOTHYROXINE SODIUM 112 MCG PO TABS: ORAL_TABLET | ORAL | 1 refills | Status: DC

## 2019-08-16 MED ORDER — AZELASTINE HCL 0.1 % NA SOLN: 1.0000 | Freq: Two times a day (BID) | NASAL | 12 refills | Status: DC

## 2019-08-16 MED ORDER — ZIPRASIDONE HCL 60 MG PO CAPS: 60.0000 mg | ORAL_CAPSULE | Freq: Every day | ORAL | 1 refills | Status: DC

## 2019-08-16 MED ORDER — NYSTATIN 100000 UNIT/GM EX POWD: CUTANEOUS | 0 refills | Status: DC

## 2019-08-16 NOTE — Patient Instructions (Signed)
Ive added Astelin nasal spray.  Ok to use in place of Flonase.  Ok to continue Claritin.

## 2019-08-16 NOTE — Progress Notes (Signed)
Subjective: CC: DM, HTN and preop exam PCP: Janora Norlander, DO EB:[id number] Morgan [name] is a 53 y.o. female presenting to clinic today for:  1. Diet controlled type 2 Diabetes w/ hx HTN and HLD  Patient reports blood sugars have been doing better with the scheduled meals.  She has had a couple of lows into the 60s but most remain in the 80s, 90s and 100s.  High was 174 and this was an outlier.  Last eye exam: Had eye exam done in June Last foot exam: Needs Last A1c:  Lab Results  Component Value Date   HGBA1C 5.5 02/13/2019   Nephropathy screen indicated?: Needs Last flu, zoster and/or pneumovax: defers flu until Oct. Immunization History  Administered Date(s) Administered  . Influenza,inj,Quad PF,6+ Mos 09/29/2016, 09/07/2017, 09/11/2018  . Pneumococcal Polysaccharide-23 06/29/2016  . Td 07/09/2015  . Tdap 07/09/2015  . Zoster Recombinat (Shingrix) 10/06/2017, 01/04/2018    ROS: Denies dizziness, LOC, polyuria, polydipsia.  No chest pain, shortness of breath.  2.  Hypothyroidism Patient reports compliance with Synthroid 112 mcg daily.  She denies tremor, heart palpitations  3.  History of vitamin D and vitamin B12 deficiency Patient with history of vitamin D and vitamin B12 deficiency after gastric bypass surgery.   She is due for B12 injection today.  Does not report peripheral sensation changes or balance problems.  She is being treated for tendinitis on the right ankle by ortho and has f/u this month.  ROS: Per HPI  Allergies  Allergen Reactions  . Ketoconazole Hives and Swelling    SWELLING REACTION UNSPECIFIED   . Pravachol [Pravastatin Sodium] Shortness Of Breath, Swelling and Anaphylaxis    Throat swelling  . Statins Other (See Comments)    Leg cramps  . Tape Dermatitis and Other (See Comments)    Steri-Strips  . Codeine Hypertension    increased BP   Past Medical History:  Diagnosis Date  . Allergic rhinitis   . Anemia   . Anxiety   . Barrett's  esophagus   . Bipolar affective disorder ([place])   . CAP (community acquired pneumonia) 07/20/2018  . Chronic respiratory failure ([place])   . Constipation, chronic   . Degenerative joint disease of spine   . Depression   . Diabetes mellitus without complication ([place])    type 2   . Dry eye   . Family history of adverse reaction to anesthesia    nausea and vomiting  . Gastroparesis   . GERD (gastroesophageal reflux disease)   . History of bariatric surgery 03/2017  . History of hiatal hernia   . HLD (hyperlipidemia) 03/12/2013  . Hyperlipidemia   . Hypertension   . Intractable chronic migraine without aura 06/04/2015  . Migraine headache   . Morbid obesity ([place])   . OSA (obstructive sleep apnea)    No cpap since gastric surgery  . Osteoarthritis    bilateral knee  . SBO (small bowel obstruction) ([place]) 03/19/2019  . Sleep apnea   . Unspecified hypothyroidism   . Vitamin B 12 deficiency   . Vitamin D deficiency     Current Outpatient Medications:  .  Calcium-Vitamin D-Vitamin K 650-12.5-40 MG-MCG-MCG CHEW, Chew 1 tablet by mouth 2 (two) times daily., Disp: , Rfl:  .  divalproex (DEPAKOTE) 500 MG DR tablet, TAKE 1 TABLET EVERY MORNING AND 2 TABLETS AT BEDTIME, Disp: 90 tablet, Rfl: 5 .  docusate sodium (COLACE) 100 MG capsule, Take 1 capsule (100 mg total) by mouth 2 (  two) times daily., Disp: 30 capsule, Rfl: 0 .  fluticasone (FLONASE) 50 MCG/ACT nasal spray, Place 2 sprays into both nostrils 2 (two) times daily as needed for allergies., Disp: 16 g, Rfl: 4 .  Fremanezumab-vfrm (AJOVY) 225 MG/1.5ML SOSY, Inject 140 mg into the skin every 30 (thirty) days., Disp: 1 Syringe, Rfl: 11 .  gabapentin (NEURONTIN) 300 MG capsule, Take 2 tabs PO in the AM, 1 tab at lunch and 2 tabs at bedtime. (Patient taking differently: Take 300-600 mg by mouth See admin instructions. Take 600mg  by mouth in the morning, 300mg  at lunch and 600mg  at bedtime.), Disp: 450 capsule, Rfl: 3 .  Glucagon, rDNA, (GLUCAGON  EMERGENCY IJ), Inject 1 Syringe as directed as needed (emergency low blood sugar)., Disp: , Rfl:  .  glucose blood (ONE TOUCH ULTRA TEST) test strip, CHECK BLOOD SUGAR FOUR TIMES A DAY.  Dx E16.2 E11.649, Disp: 400 each, Rfl: 3 .  levothyroxine (SYNTHROID) 112 MCG tablet, TAKE 1 TABLET BEFORE BREAKFAST, Disp: 90 tablet, Rfl: 1 .  loratadine (CLARITIN) 10 MG tablet, Take 10 mg by mouth daily as needed for allergies. , Disp: , Rfl:  .  methocarbamol (ROBAXIN) 750 MG tablet, Take 750 mg by mouth 3 (three) times daily., Disp: , Rfl:  .  Multiple Vitamin (MULTIVITAMIN) capsule, Take 1 capsule by mouth daily. , Disp: , Rfl:  .  nystatin (NYAMYC) powder, APPLY TOPICALLY TWICE A DAY AS NEEDED, Disp: 30 g, Rfl: 0 .  ondansetron (ZOFRAN) 4 MG tablet, Take 1 tablet (4 mg total) by mouth every 8 (eight) hours., Disp: 90 tablet, Rfl: 11 .  oxyCODONE-acetaminophen (PERCOCET) 7.5-325 MG tablet, Take 1 tablet by mouth every 4 (four) hours., Disp: , Rfl:  .  pantoprazole (PROTONIX) 40 MG tablet, Take 1 tablet (40 mg total) by mouth daily., Disp: 90 tablet, Rfl: 1 .  PAZEO 0.7 % SOLN, Place 1 drop into both eyes every morning., Disp: , Rfl:  .  polyethylene glycol (MIRALAX / GLYCOLAX) packet, Take 17 g by mouth at bedtime. , Disp: , Rfl:  .  Probiotic Product (PROBIOTIC DAILY PO), Take 1 capsule by mouth daily., Disp: , Rfl:  .  RESTASIS MULTIDOSE 0.05 % ophthalmic emulsion, Place 1 drop into both eyes 2 (two) times daily., Disp: , Rfl:  .  sertraline (ZOLOFT) 100 MG tablet, Take 2 tablets (200 mg total) by mouth daily., Disp: 180 tablet, Rfl: 3 .  tiZANidine (ZANAFLEX) 2 MG tablet, Take 1 tablet (2 mg total) by mouth every 8 (eight) hours as needed for muscle spasms. (Patient not taking: Reported on 05/21/2019), Disp: 60 tablet, Rfl: 0 .  ziprasidone (GEODON) 60 MG capsule, Take 1 capsule (60 mg total) by mouth at bedtime., Disp: 90 capsule, Rfl: 1  Current Facility-Administered Medications:  .  cyanocobalamin  ((VITAMIN B-12)) injection 1,000 mcg, 1,000 mcg, Intramuscular, Q30 days, Gottschalk, Ashly M, DO, 1,000 mcg at 07/17/19 [id number]   Social History   Socioeconomic History  . Marital status: Single    Spouse name: Not on file  . Number of children: 0  . Years of education: HS  . Highest education level: 12th grade  Occupational History  . Occupation: disabled    Fish farm manager: UNEMPLOYED  Social Needs  . Financial resource strain: Not hard at all  . Food insecurity    Worry: Never true    Inability: Never true  . Transportation needs    Medical: No    Non-medical: No  Tobacco Use  . Smoking  status: Never Smoker  . Smokeless tobacco: Never Used  Substance and Sexual Activity  . Alcohol use: No  . Drug use: No  . Sexual activity: Never    Birth control/protection: None  Lifestyle  . Physical activity    Days per week: 0 days    Minutes per session: 0 min  . Stress: Not at all  Relationships  . Social connections    Talks on phone: More than three times a week    Gets together: More than three times a week    Attends religious service: More than 4 times per year    Active member of club or organization: No    Attends meetings of clubs or organizations: Never    Relationship status: Never married  . Intimate partner violence    Fear of current or ex partner: No    Emotionally abused: No    Physically abused: No    Forced sexual activity: No  Other Topics Concern  . Not on file  Social History Narrative   Patient is right handed.   Patient drinks 2 glasses of caffeine daily.   Lives at home. Her niece is living with her right now.   Family History  Problem Relation Age of Onset  . Asthma Father   . Allergies Father   . Heart disease Father        enlarged heart  . Peripheral vascular disease Father   . Diabetes Father   . Hyperlipidemia Father   . Arthritis Father   . Asthma Sister   . Cancer Sister        colon at 45 yr old.  . Colon cancer Sister   . Allergies  Mother   . Stroke Mother 83       with hemi paralysis  . Diabetes Mother   . Hyperlipidemia Mother   . Hypertension Mother   . GI problems Mother   . Arthritis Mother   . Allergies Brother   . Early death Brother 9       congenital abormality  . Allergies Sister   . Diabetes Sister   . Asthma Sister   . Colon polyps Sister   . Hyperlipidemia Sister   . GI problems Sister        gastroporesis   . Liver disease Sister        fatty liver  . Stroke Sister        intercrandial bleed  . Diabetes Brother   . Hypertension Brother   . Hyperlipidemia Brother   . Heart disease Paternal Aunt   . Migraines Neg Hx     Objective: Office vital signs reviewed. BP 127/82   Pulse 78   Temp 97.7 F (36.5 C) (Temporal)   Ht 5\' 4"  (1.626 m)   Wt 154 lb (69.9 kg)   LMP 10/17/2017 (Approximate)   SpO2 97%   BMI 26.43 kg/m   Physical Examination:  General: Awake, alert, thin, No acute distress HEENT: Normal, sclera white.  Nasal turbinates moist and without significant erythema or edema.  No purulence within the nares.  Oropharynx with mild cobblestone appearance. Cardio: regular rate and rhythm, S1S2 heard, no murmurs appreciated Pulm: clear to auscultation bilaterally, no wheezes, rhonchi or rales; normal work of breathing on room air Extremities: warm, well perfused, No edema, cyanosis or clubbing; +2 pulses bilaterally Neuro: see DM foot  Diabetic Foot Exam - Simple   Simple Foot Form Diabetic Foot exam was performed with the following findings:  Yes 08/16/2019  9:14 AM  Visual Inspection No deformities, no ulcerations, no other skin breakdown bilaterally: Yes Sensation Testing Intact to touch and monofilament testing bilaterally: Yes Pulse Check Posterior Tibialis and Dorsalis pulse intact bilaterally: Yes Comments     Assessment/ Plan: 53 y.o. female   1. Diet-controlled diabetes mellitus (HCC) A1c 5.4 today.  Check microalbumin - Bayer DCA Hb A1c Waived - Basic  Metabolic Panel - Microalbumin / creatinine urine ratio  2. Leukopenia, unspecified type Noted on last CBC.  Check CBC with path smear review - CBC - Pathologist smear review  3. Hypokalemia Recheck potassium - Basic Metabolic Panel  4. Acquired hypothyroidism Asymptomatic - Thyroid Panel With TSH - levothyroxine (SYNTHROID) 112 MCG tablet; TAKE 1 TABLET BEFORE BREAKFAST  Dispense: 90 tablet; Refill: 1  5. Anemia, unspecified type Has history of B12 deficiency.  She is due for B12 injection today - Anemia panel  6. History of Roux-en-Y gastric bypass - Anemia panel  7. Bipolar affective disorder in remission (HCC) Stable - ziprasidone (GEODON) 60 MG capsule; Take 1 capsule (60 mg total) by mouth at bedtime.  Dispense: 90 capsule; Refill: 1  8. Post-nasal drip No infectious appearance.  I have placed her on Astelin.  Okay to continue Claritin - azelastine (ASTELIN) 0.1 % nasal spray; Place 1 spray into both nostrils 2 (two) times daily.  Dispense: 30 mL; Refill: 12   No orders of the defined types were placed in this encounter.   No orders of the defined types were placed in this encounter.   Janora Norlander, DO [facility] [phone]

## 2019-08-17 LAB — MICROALBUMIN / CREATININE URINE RATIO
Creatinine, Urine: 69.1 mg/dL
Microalb/Creat Ratio: 6 mg/g creat (ref 0–29)
Microalbumin, Urine: 4.1 ug/mL

## 2019-08-20 LAB — THYROID PANEL WITH TSH
Free Thyroxine Index: 1.2 (ref 1.2–4.9)
T3 Uptake Ratio: 23 % — ABNORMAL LOW (ref 24–39)
T4, Total: 5.4 ug/dL (ref 4.5–12.0)
TSH: 3.57 u[IU]/mL (ref 0.450–4.500)

## 2019-08-20 LAB — BASIC METABOLIC PANEL
BUN/Creatinine Ratio: 40 — ABNORMAL HIGH (ref 9–23)
BUN: 23 mg/dL (ref 6–24)
CO2: 27 mmol/L (ref 20–29)
Calcium: 9.4 mg/dL (ref 8.7–10.2)
Chloride: 100 mmol/L (ref 96–106)
Creatinine, Ser: 0.57 mg/dL (ref 0.57–1.00)
GFR calc Af Amer: 123 mL/min/{1.73_m2} (ref >59)
GFR calc non Af Amer: 107 mL/min/{1.73_m2} (ref >59)
Glucose: 82 mg/dL (ref 65–99)
Potassium: 4.3 mmol/L (ref 3.5–5.2)
Sodium: 143 mmol/L (ref 134–144)

## 2019-08-20 LAB — CBC
Hemoglobin: 13.4 g/dL (ref 11.1–15.9)
MCH: 30.5 pg (ref 26.6–33.0)
MCHC: 33.3 g/dL (ref 31.5–35.7)
MCV: 91 fL (ref 79–97)
Platelets: 195 10*3/uL (ref 150–450)
RBC: 4.4 x10E6/uL (ref 3.77–5.28)
RDW: 13.4 % (ref 11.7–15.4)
WBC: 3.9 10*3/uL (ref 3.4–10.8)

## 2019-08-20 LAB — PATHOLOGIST SMEAR REVIEW
Basophils Absolute: 0 10*3/uL (ref 0.0–0.2)
Basos: 1 %
EOS (ABSOLUTE): 0.1 10*3/uL (ref 0.0–0.4)
Eos: 3 %
Hematocrit: 42.2 % (ref 34.0–46.6)
Hemoglobin: 13.4 g/dL (ref 11.1–15.9)
Immature Grans (Abs): 0 10*3/uL (ref 0.0–0.1)
Immature Granulocytes: 1 %
Lymphocytes Absolute: 1.4 10*3/uL (ref 0.7–3.1)
Lymphs: 37 %
MCH: 30.1 pg (ref 26.6–33.0)
MCHC: 31.8 g/dL (ref 31.5–35.7)
MCV: 95 fL (ref 79–97)
Monocytes Absolute: 0.2 10*3/uL (ref 0.1–0.9)
Monocytes: 6 %
Neutrophils Absolute: 2 10*3/uL (ref 1.4–7.0)
Neutrophils: 52 %
Platelets: 181 10*3/uL (ref 150–450)
RBC: 4.45 x10E6/uL (ref 3.77–5.28)
RDW: 13.4 % (ref 11.7–15.4)
WBC: 3.8 10*3/uL (ref 3.4–10.8)

## 2019-08-20 LAB — ANEMIA PANEL
Ferritin: 54 ng/mL (ref 15–150)
Folate, Hemolysate: >620 ng/mL
Folate, RBC: >1542 ng/mL (ref >498)
Hematocrit: 40.2 % (ref 34.0–46.6)
Iron Saturation: 17 % (ref 15–55)
Iron: 78 ug/dL (ref 27–159)
Retic Ct Pct: 1.2 % (ref 0.6–2.6)
Total Iron Binding Capacity: 455 ug/dL — ABNORMAL HIGH (ref 250–450)
UIBC: 377 ug/dL (ref 131–425)
Vitamin B-12: 1579 pg/mL — ABNORMAL HIGH (ref 232–1245)

## 2019-08-21 DIAGNOSIS — Z1231 Encounter for screening mammogram for malignant neoplasm of breast: Secondary | ICD-10-CM | POA: Diagnosis not present

## 2019-08-28 DIAGNOSIS — Z9884 Bariatric surgery status: Secondary | ICD-10-CM | POA: Diagnosis not present

## 2019-08-28 DIAGNOSIS — E7800 Pure hypercholesterolemia, unspecified: Secondary | ICD-10-CM | POA: Diagnosis not present

## 2019-08-28 DIAGNOSIS — K5909 Other constipation: Secondary | ICD-10-CM | POA: Diagnosis not present

## 2019-08-28 DIAGNOSIS — I10 Essential (primary) hypertension: Secondary | ICD-10-CM | POA: Diagnosis not present

## 2019-09-02 DIAGNOSIS — M25571 Pain in right ankle and joints of right foot: Secondary | ICD-10-CM | POA: Diagnosis not present

## 2019-09-12 DIAGNOSIS — M25571 Pain in right ankle and joints of right foot: Secondary | ICD-10-CM | POA: Diagnosis not present

## 2019-09-13 ENCOUNTER — Other Ambulatory Visit: Payer: Self-pay | Admitting: Orthopaedic Surgery

## 2019-09-13 DIAGNOSIS — M24871 Other specific joint derangements of right ankle, not elsewhere classified: Secondary | ICD-10-CM | POA: Diagnosis not present

## 2019-09-17 ENCOUNTER — Other Ambulatory Visit: Payer: Self-pay

## 2019-09-17 ENCOUNTER — Encounter (HOSPITAL_BASED_OUTPATIENT_CLINIC_OR_DEPARTMENT_OTHER): Payer: Self-pay | Admitting: *Deleted

## 2019-09-18 ENCOUNTER — Other Ambulatory Visit: Payer: Self-pay | Admitting: Adult Health

## 2019-09-19 DIAGNOSIS — M47816 Spondylosis without myelopathy or radiculopathy, lumbar region: Secondary | ICD-10-CM | POA: Diagnosis not present

## 2019-09-19 NOTE — Progress Notes (Signed)

## 2019-09-20 ENCOUNTER — Other Ambulatory Visit (HOSPITAL_COMMUNITY)
Admission: RE | Admit: 2019-09-20 | Discharge: 2019-09-20 | Disposition: A | Discharge status: Home / Self Care | Payer: Medicare Other | Source: Ambulatory Visit | Attending: Orthopaedic Surgery | Admitting: Orthopaedic Surgery

## 2019-09-20 DIAGNOSIS — Z01812 Encounter for preprocedural laboratory examination: Secondary | ICD-10-CM | POA: Insufficient documentation

## 2019-09-20 DIAGNOSIS — Z20828 Contact with and (suspected) exposure to other viral communicable diseases: Secondary | ICD-10-CM | POA: Insufficient documentation

## 2019-09-21 LAB — NOVEL CORONAVIRUS, NAA (HOSP ORDER, SEND-OUT TO REF LAB; TAT 18-24 HRS): SARS-CoV-2, NAA: NOT DETECTED

## 2019-09-23 NOTE — Anesthesia Preprocedure Evaluation (Signed)
Anesthesia Evaluation  Patient identified by MRN, date of birth, ID band Patient awake    Reviewed: Allergy & Precautions, NPO status , Patient's Chart, lab work & pertinent test results  Airway Mallampati: II  TM Distance: >3 FB Neck ROM: Full    Dental no notable dental hx.    Pulmonary sleep apnea ,  OSA (obstructive sleep apnea)  No cpap since gastric surgery    Pulmonary exam normal breath sounds clear to auscultation       Cardiovascular hypertension, Normal cardiovascular exam Rhythm:Regular Rate:Normal     Neuro/Psych  Headaches, Bipolar Disorder    GI/Hepatic Neg liver ROS, hiatal hernia, GERD  ,  Endo/Other  diabetesHypothyroidism   Renal/GU negative Renal ROS  negative genitourinary   Musculoskeletal  (+) Arthritis , Osteoarthritis,    Abdominal   Peds negative pediatric ROS (+)  Hematology  (+) Blood dyscrasia, anemia ,   Anesthesia Other Findings   Reproductive/Obstetrics negative OB ROS                             Anesthesia Physical  Anesthesia Plan  ASA: III  Anesthesia Plan: General   Post-op Pain Management: GA combined w/ Regional for post-op pain   Induction: Intravenous  PONV Risk Score and Plan: 3 and Ondansetron, Dexamethasone, Treatment may vary due to age or medical condition and Midazolam  Airway Management Planned: LMA  Additional Equipment:   Intra-op Plan:   Post-operative Plan: Extubation in OR  Informed Consent: I have reviewed the patients History and Physical, chart, labs and discussed the procedure including the risks, benefits and alternatives for the proposed anesthesia with the patient or authorized representative who has indicated his/her understanding and acceptance.     Dental advisory given  Plan Discussed with: CRNA, Surgeon and Anesthesiologist  Anesthesia Plan Comments:         Anesthesia Quick Evaluation

## 2019-09-24 ENCOUNTER — Ambulatory Visit (HOSPITAL_BASED_OUTPATIENT_CLINIC_OR_DEPARTMENT_OTHER): Payer: Medicare Other | Admitting: Anesthesiology

## 2019-09-24 ENCOUNTER — Ambulatory Visit (HOSPITAL_BASED_OUTPATIENT_CLINIC_OR_DEPARTMENT_OTHER)
Admission: RE | Admit: 2019-09-24 | Discharge: 2019-09-24 | Disposition: A | Discharge status: Home / Self Care | Payer: Medicare Other | Attending: Orthopaedic Surgery | Admitting: Orthopaedic Surgery

## 2019-09-24 ENCOUNTER — Other Ambulatory Visit: Payer: Self-pay

## 2019-09-24 ENCOUNTER — Encounter (HOSPITAL_BASED_OUTPATIENT_CLINIC_OR_DEPARTMENT_OTHER)
Admission: RE | Disposition: A | Discharge status: Home / Self Care | Payer: Self-pay | Source: Home / Self Care | Attending: Orthopaedic Surgery

## 2019-09-24 ENCOUNTER — Encounter (HOSPITAL_BASED_OUTPATIENT_CLINIC_OR_DEPARTMENT_OTHER): Payer: Self-pay | Admitting: Emergency Medicine

## 2019-09-24 DIAGNOSIS — M25871 Other specified joint disorders, right ankle and foot: Secondary | ICD-10-CM | POA: Diagnosis not present

## 2019-09-24 DIAGNOSIS — E039 Hypothyroidism, unspecified: Secondary | ICD-10-CM | POA: Insufficient documentation

## 2019-09-24 DIAGNOSIS — E1143 Type 2 diabetes mellitus with diabetic autonomic (poly)neuropathy: Secondary | ICD-10-CM | POA: Insufficient documentation

## 2019-09-24 DIAGNOSIS — M7671 Peroneal tendinitis, right leg: Secondary | ICD-10-CM | POA: Diagnosis not present

## 2019-09-24 DIAGNOSIS — G8918 Other acute postprocedural pain: Secondary | ICD-10-CM | POA: Diagnosis not present

## 2019-09-24 DIAGNOSIS — E559 Vitamin D deficiency, unspecified: Secondary | ICD-10-CM | POA: Insufficient documentation

## 2019-09-24 DIAGNOSIS — K5909 Other constipation: Secondary | ICD-10-CM | POA: Insufficient documentation

## 2019-09-24 DIAGNOSIS — J309 Allergic rhinitis, unspecified: Secondary | ICD-10-CM | POA: Insufficient documentation

## 2019-09-24 DIAGNOSIS — F319 Bipolar disorder, unspecified: Secondary | ICD-10-CM | POA: Insufficient documentation

## 2019-09-24 DIAGNOSIS — G43909 Migraine, unspecified, not intractable, without status migrainosus: Secondary | ICD-10-CM | POA: Insufficient documentation

## 2019-09-24 DIAGNOSIS — F419 Anxiety disorder, unspecified: Secondary | ICD-10-CM | POA: Insufficient documentation

## 2019-09-24 DIAGNOSIS — K3184 Gastroparesis: Secondary | ICD-10-CM | POA: Diagnosis not present

## 2019-09-24 DIAGNOSIS — K219 Gastro-esophageal reflux disease without esophagitis: Secondary | ICD-10-CM | POA: Insufficient documentation

## 2019-09-24 DIAGNOSIS — Z79899 Other long term (current) drug therapy: Secondary | ICD-10-CM | POA: Diagnosis not present

## 2019-09-24 DIAGNOSIS — Z79890 Hormone replacement therapy (postmenopausal): Secondary | ICD-10-CM | POA: Diagnosis not present

## 2019-09-24 DIAGNOSIS — M24071 Loose body in right ankle: Secondary | ICD-10-CM | POA: Diagnosis not present

## 2019-09-24 DIAGNOSIS — M65871 Other synovitis and tenosynovitis, right ankle and foot: Secondary | ICD-10-CM | POA: Diagnosis not present

## 2019-09-24 DIAGNOSIS — M67873 Other specified disorders of tendon, right ankle and foot: Secondary | ICD-10-CM | POA: Insufficient documentation

## 2019-09-24 DIAGNOSIS — Z9884 Bariatric surgery status: Secondary | ICD-10-CM | POA: Insufficient documentation

## 2019-09-24 HISTORY — PX: ANKLE ARTHROSCOPY WITH RECONSTRUCTION: SHX5583

## 2019-09-24 HISTORY — DX: Type 2 diabetes mellitus without complications: E11.9

## 2019-09-24 LAB — GLUCOSE, CAPILLARY: Glucose-Capillary: 64 mg/dL — ABNORMAL LOW (ref 70–99)

## 2019-09-24 SURGERY — ARTHROSCOPY, ANKLE, WITH RECONSTRUCTION
Anesthesia: General | Site: Ankle | Laterality: Right

## 2019-09-24 MED ORDER — PHENYLEPHRINE 40 MCG/ML (10ML) SYRINGE FOR IV PUSH (FOR BLOOD PRESSURE SUPPORT)
PREFILLED_SYRINGE | INTRAVENOUS | Status: DC | PRN
  Administered 2019-09-24 (×4): 80 ug via INTRAVENOUS

## 2019-09-24 MED ORDER — PHENYLEPHRINE 40 MCG/ML (10ML) SYRINGE FOR IV PUSH (FOR BLOOD PRESSURE SUPPORT)
PREFILLED_SYRINGE | INTRAVENOUS | Status: AC
  Filled 2019-09-24: qty 10

## 2019-09-24 MED ORDER — CEFAZOLIN SODIUM-DEXTROSE 2-4 GM/100ML-% IV SOLN
2.0000 g | INTRAVENOUS | Status: AC
  Administered 2019-09-24: 07:00:00 2 g via INTRAVENOUS

## 2019-09-24 MED ORDER — MIDAZOLAM HCL 2 MG/2ML IJ SOLN
1.0000 mg | INTRAMUSCULAR | Status: DC | PRN
  Administered 2019-09-24: 07:00:00 2 mg via INTRAVENOUS

## 2019-09-24 MED ORDER — ONDANSETRON HCL 4 MG/2ML IJ SOLN: 4.0000 mg | Freq: Once | INTRAMUSCULAR | Status: DC | PRN

## 2019-09-24 MED ORDER — ONDANSETRON HCL 4 MG/2ML IJ SOLN
INTRAMUSCULAR | Status: AC
  Filled 2019-09-24: qty 2

## 2019-09-24 MED ORDER — SODIUM CHLORIDE 0.9 % IR SOLN
Status: DC | PRN
  Administered 2019-09-24: 2000 mL

## 2019-09-24 MED ORDER — FENTANYL CITRATE (PF) 100 MCG/2ML IJ SOLN: 25.0000 ug | INTRAMUSCULAR | Status: DC | PRN

## 2019-09-24 MED ORDER — MIDAZOLAM HCL 2 MG/2ML IJ SOLN
INTRAMUSCULAR | Status: AC
  Filled 2019-09-24: qty 2

## 2019-09-24 MED ORDER — OXYCODONE HCL 5 MG/5ML PO SOLN: 5.0000 mg | Freq: Once | ORAL | Status: DC | PRN

## 2019-09-24 MED ORDER — ONDANSETRON HCL 4 MG/2ML IJ SOLN
INTRAMUSCULAR | Status: DC | PRN
  Administered 2019-09-24: 4 mg via INTRAVENOUS

## 2019-09-24 MED ORDER — PROPOFOL 500 MG/50ML IV EMUL
INTRAVENOUS | Status: AC
  Filled 2019-09-24: qty 50

## 2019-09-24 MED ORDER — POVIDONE-IODINE 10 % EX SWAB: 2.0000 "application " | Freq: Once | CUTANEOUS | Status: DC

## 2019-09-24 MED ORDER — LIDOCAINE 2% (20 MG/ML) 5 ML SYRINGE
INTRAMUSCULAR | Status: AC
  Filled 2019-09-24: qty 5

## 2019-09-24 MED ORDER — MEPERIDINE HCL 25 MG/ML IJ SOLN: 6.2500 mg | INTRAMUSCULAR | Status: DC | PRN

## 2019-09-24 MED ORDER — LACTATED RINGERS IV SOLN
INTRAVENOUS | Status: DC
  Administered 2019-09-24 (×2): via INTRAVENOUS

## 2019-09-24 MED ORDER — OXYCODONE HCL 5 MG PO TABS: 5.0000 mg | ORAL_TABLET | ORAL | 0 refills | Status: AC | PRN

## 2019-09-24 MED ORDER — FENTANYL CITRATE (PF) 100 MCG/2ML IJ SOLN
INTRAMUSCULAR | Status: AC
  Filled 2019-09-24: qty 2

## 2019-09-24 MED ORDER — ROPIVACAINE HCL 7.5 MG/ML IJ SOLN
INTRAMUSCULAR | Status: DC | PRN
  Administered 2019-09-24: 30 mL via PERINEURAL

## 2019-09-24 MED ORDER — OXYCODONE HCL 5 MG PO TABS: 5.0000 mg | ORAL_TABLET | Freq: Once | ORAL | Status: DC | PRN

## 2019-09-24 MED ORDER — CLONIDINE HCL (ANALGESIA) 100 MCG/ML EP SOLN
EPIDURAL | Status: DC | PRN
  Administered 2019-09-24: 100 ug

## 2019-09-24 MED ORDER — DEXAMETHASONE SODIUM PHOSPHATE 10 MG/ML IJ SOLN
INTRAMUSCULAR | Status: AC
  Filled 2019-09-24: qty 1

## 2019-09-24 MED ORDER — EPHEDRINE SULFATE-NACL 50-0.9 MG/10ML-% IV SOSY
PREFILLED_SYRINGE | INTRAVENOUS | Status: DC | PRN
  Administered 2019-09-24 (×2): 10 mg via INTRAVENOUS

## 2019-09-24 MED ORDER — EPHEDRINE 5 MG/ML INJ
INTRAVENOUS | Status: AC
  Filled 2019-09-24: qty 10

## 2019-09-24 MED ORDER — PROPOFOL 10 MG/ML IV BOLUS
INTRAVENOUS | Status: DC | PRN
  Administered 2019-09-24: 150 mg via INTRAVENOUS

## 2019-09-24 MED ORDER — ACETAMINOPHEN 160 MG/5ML PO SOLN: 325.0000 mg | ORAL | Status: DC | PRN

## 2019-09-24 MED ORDER — FENTANYL CITRATE (PF) 100 MCG/2ML IJ SOLN
50.0000 ug | INTRAMUSCULAR | Status: DC | PRN
  Administered 2019-09-24: 100 ug via INTRAVENOUS

## 2019-09-24 MED ORDER — CEFAZOLIN SODIUM-DEXTROSE 2-4 GM/100ML-% IV SOLN
INTRAVENOUS | Status: AC
  Filled 2019-09-24: qty 100

## 2019-09-24 MED ORDER — DEXAMETHASONE SODIUM PHOSPHATE 10 MG/ML IJ SOLN
INTRAMUSCULAR | Status: DC | PRN
  Administered 2019-09-24: 10 mg via INTRAVENOUS

## 2019-09-24 MED ORDER — ACETAMINOPHEN 325 MG PO TABS: 325.0000 mg | ORAL_TABLET | ORAL | Status: DC | PRN

## 2019-09-24 MED ORDER — LIDOCAINE HCL (CARDIAC) PF 100 MG/5ML IV SOSY
PREFILLED_SYRINGE | INTRAVENOUS | Status: DC | PRN
  Administered 2019-09-24: 50 mg via INTRAVENOUS

## 2019-09-24 MED ORDER — SCOPOLAMINE 1 MG/3DAYS TD PT72: 1.0000 | MEDICATED_PATCH | Freq: Once | TRANSDERMAL | Status: DC

## 2019-09-24 SURGICAL SUPPLY — 75 items
APL PRP STRL LF DISP 70% ISPRP (MISCELLANEOUS) ×1
APL SKNCLS STERI-STRIP NONHPOA (GAUZE/BANDAGES/DRESSINGS)
BANDAGE ESMARK 6X9 LF (GAUZE/BANDAGES/DRESSINGS) ×1 IMPLANT
BENZOIN TINCTURE PRP APPL 2/3 (GAUZE/BANDAGES/DRESSINGS) IMPLANT
BLADE SURG 15 STRL LF DISP TIS (BLADE) ×1 IMPLANT
BLADE SURG 15 STRL SS (BLADE) ×3
BNDG CMPR 9X4 STRL LF SNTH (GAUZE/BANDAGES/DRESSINGS)
BNDG CMPR 9X6 STRL LF SNTH (GAUZE/BANDAGES/DRESSINGS) ×1
BNDG ELASTIC 4X5.8 VLCR STR LF (GAUZE/BANDAGES/DRESSINGS) ×1 IMPLANT
BNDG ELASTIC 6X5.8 VLCR STR LF (GAUZE/BANDAGES/DRESSINGS) ×5 IMPLANT
BNDG ESMARK 4X9 LF (GAUZE/BANDAGES/DRESSINGS) IMPLANT
BNDG ESMARK 6X9 LF (GAUZE/BANDAGES/DRESSINGS) ×3
CHLORAPREP W/TINT 26 (MISCELLANEOUS) ×3 IMPLANT
CLOSURE WOUND 1/2 X4 (GAUZE/BANDAGES/DRESSINGS)
COVER WAND RF STERILE (DRAPES) IMPLANT
CUFF TOURN SGL QUICK 34 (TOURNIQUET CUFF) ×3
CUFF TRNQT CYL 34X4.125X (TOURNIQUET CUFF) ×1 IMPLANT
DECANTER SPIKE VIAL GLASS SM (MISCELLANEOUS) IMPLANT
DRAPE EXTREMITY T 121X128X90 (DISPOSABLE) ×3 IMPLANT
DRAPE OEC MINIVIEW 54X84 (DRAPES) ×1 IMPLANT
DRAPE U-SHAPE 47X51 STRL (DRAPES) ×3 IMPLANT
DRSG PAD ABDOMINAL 8X10 ST (GAUZE/BANDAGES/DRESSINGS) ×3 IMPLANT
ELECT REM PT RETURN 9FT ADLT (ELECTROSURGICAL) ×3
ELECTRODE REM PT RTRN 9FT ADLT (ELECTROSURGICAL) IMPLANT
EXCALIBUR 3.8MM X 13CM (MISCELLANEOUS) ×3 IMPLANT
GAUZE SPONGE 4X4 12PLY STRL (GAUZE/BANDAGES/DRESSINGS) ×3 IMPLANT
GAUZE XEROFORM 1X8 LF (GAUZE/BANDAGES/DRESSINGS) ×3 IMPLANT
GLOVE BIO SURGEON STRL SZ 6.5 (GLOVE) ×1 IMPLANT
GLOVE BIO SURGEONS STRL SZ 6.5 (GLOVE) ×1
GLOVE BIOGEL M STRL SZ7.5 (GLOVE) ×3 IMPLANT
GLOVE BIOGEL PI IND STRL 6.5 (GLOVE) IMPLANT
GLOVE BIOGEL PI IND STRL 7.0 (GLOVE) IMPLANT
GLOVE BIOGEL PI IND STRL 8 (GLOVE) ×1 IMPLANT
GLOVE BIOGEL PI INDICATOR 6.5 (GLOVE) ×2
GLOVE BIOGEL PI INDICATOR 7.0 (GLOVE) ×2
GLOVE BIOGEL PI INDICATOR 8 (GLOVE) ×2
GLOVE ECLIPSE 6.5 STRL STRAW (GLOVE) ×2 IMPLANT
GOWN STRL REUS W/ TWL LRG LVL3 (GOWN DISPOSABLE) ×1 IMPLANT
GOWN STRL REUS W/ TWL XL LVL3 (GOWN DISPOSABLE) ×1 IMPLANT
GOWN STRL REUS W/TWL LRG LVL3 (GOWN DISPOSABLE) ×3
GOWN STRL REUS W/TWL XL LVL3 (GOWN DISPOSABLE) ×6
NDL KEITH (NEEDLE) IMPLANT
NEEDLE KEITH (NEEDLE) IMPLANT
NS IRRIG 1000ML POUR BTL (IV SOLUTION) ×3 IMPLANT
PACK ARTHROSCOPY DSU (CUSTOM PROCEDURE TRAY) ×3 IMPLANT
PACK BASIN DAY SURGERY FS (CUSTOM PROCEDURE TRAY) ×3 IMPLANT
PAD CAST 4YDX4 CTTN HI CHSV (CAST SUPPLIES) ×1 IMPLANT
PADDING CAST COTTON 4X4 STRL (CAST SUPPLIES) ×3
PADDING CAST SYNTHETIC 4 (CAST SUPPLIES) ×8
PADDING CAST SYNTHETIC 4X4 STR (CAST SUPPLIES) ×2 IMPLANT
PENCIL BUTTON HOLSTER BLD 10FT (ELECTRODE) ×2 IMPLANT
SHAVER DISSECTOR 3.0 (BURR) IMPLANT
SHAVER SABRE 2.0 (BURR) IMPLANT
SLEEVE SCD COMPRESS KNEE MED (MISCELLANEOUS) ×3 IMPLANT
SPLINT FAST PLASTER 5X30 (CAST SUPPLIES) ×40
SPLINT PLASTER CAST FAST 5X30 (CAST SUPPLIES) IMPLANT
SPONGE LAP 18X18 RF (DISPOSABLE) ×2 IMPLANT
STOCKINETTE 6  STRL (DRAPES) ×2
STOCKINETTE 6 STRL (DRAPES) ×1 IMPLANT
STRAP ANKLE FOOT DISTRACTOR (ORTHOPEDIC SUPPLIES) ×3 IMPLANT
STRIP CLOSURE SKIN 1/2X4 (GAUZE/BANDAGES/DRESSINGS) IMPLANT
SUCTION FRAZIER HANDLE 10FR (MISCELLANEOUS) ×2
SUCTION TUBE FRAZIER 10FR DISP (MISCELLANEOUS) ×1 IMPLANT
SUT BONE WAX W31G (SUTURE) ×3 IMPLANT
SUT ETHILON 3 0 PS 1 (SUTURE) ×5 IMPLANT
SUT FIBERWIRE 2-0 18 17.9 3/8 (SUTURE) ×3
SUT MNCRL AB 3-0 PS2 18 (SUTURE) ×3 IMPLANT
SUT PDS AB 2-0 CT2 27 (SUTURE) ×3 IMPLANT
SUT VIC AB 0 SH 27 (SUTURE) IMPLANT
SUT VIC AB 3-0 FS2 27 (SUTURE) IMPLANT
SUTURE FIBERWR 2-0 18 17.9 3/8 (SUTURE) IMPLANT
SYR BULB 3OZ (MISCELLANEOUS) IMPLANT
TOWEL GREEN STERILE FF (TOWEL DISPOSABLE) ×6 IMPLANT
TUBING ARTHROSCOPY IRRIG 16FT (MISCELLANEOUS) ×3 IMPLANT
UNDERPAD 30X36 HEAVY ABSORB (UNDERPADS AND DIAPERS) ×3 IMPLANT

## 2019-09-24 NOTE — H&P (Signed)
Morgan [name] is an 53 y.o. female.   Chief Complaint: Right ankle pain HPI: Morgan [name] is a 53 year old female here for surgical intervention for her right ankle pain.  She has had ongoing pain in the right anterolateral aspect of her ankle as well as posterior lateral aspect of her ankle for several months.  She had an injury previously and never fully recovered.  She was seen by an outside provider where MRI was performed and demonstrated some evidence of chondrosis about the ankle and some talar dome edema.  Also evidence of abnormality along the peroneal tendons with apparent intact lateral ligament complex.  She had failed conservative treatment for boot immobilization, anti-inflammatories, activity modifications and physical therapy and continued to have pain.  She was indicated for surgery and wished to proceed.  Past Medical History:  Diagnosis Date  . Allergic rhinitis   . Anemia     after gastric bypass in 2018  . Anxiety   . Barrett's esophagus   . Bipolar affective disorder ([place])   . CAP (community acquired pneumonia) 07/20/2018  . Chronic respiratory failure ([place])   . Complication of anesthesia    "they had a hard time keeping my blood sugar up after back surgery in April 2020" per pt  . Constipation, chronic   . Degenerative joint disease of spine   . Depression   . Diabetes mellitus without complication ([place])    type 2   . DM (diabetes mellitus) ([place])    "hypoglycemic" per pt - no longer takes DM meds due to weight loss from gastric bypass in 2018  . Dry eye   . Family history of adverse reaction to anesthesia    nausea and vomiting  . Gastroparesis   . GERD (gastroesophageal reflux disease)   . History of bariatric surgery 03/2017  . History of hiatal hernia   . HLD (hyperlipidemia) 03/12/2013  . Hyperlipidemia   . Hypertension    No HTN meds since weight loss from Gastric Bypass in 2018  . Intractable chronic migraine without aura 06/04/2015  . Migraine headache   .  Morbid obesity ([place])   . OSA (obstructive sleep apnea)    No cpap since gastric surgery  . Osteoarthritis    bilateral knee  . SBO (small bowel obstruction) ([place]) 03/19/2019  . Sleep apnea   . Unspecified hypothyroidism   . Vitamin B 12 deficiency   . Vitamin D deficiency     Past Surgical History:  Procedure Laterality Date  . APPENDECTOMY  1978  . CHOLECYSTECTOMY  2005  . COLONOSCOPY    . disc repair  05/17/2019   with rods, lumbar spine  . GASTRIC ROUX-EN-Y N/A 03/21/2017   Procedure: LAPAROSCOPIC ROUX-EN-Y GASTRIC, UPPER ENDO;  Surgeon: Greer Pickerel, MD;  Location: [facility];  Service: General;  Laterality: N/A;  . GASTROSTOMY N/A 06/19/2018   Procedure: LAPRASCOPIC INSERTION OF GASTROSTOMY TUBE;  Surgeon: Greer Pickerel, MD;  Location: [facility];  Service: General;  Laterality: N/A;  . GASTROSTOMY TUBE PLACEMENT Left    06/2018  . HERNIA REPAIR    . HIATAL HERNIA REPAIR N/A 06/19/2018   Procedure: LAPAROSCOPIC REPAIR OF HIATAL HERNIA;  Surgeon: Greer Pickerel, MD;  Location: [facility];  Service: General;  Laterality: N/A;  . LAPAROSCOPIC LYSIS OF ADHESIONS  03/19/2019   Dr. Romana Juniper  . LAPAROSCOPY N/A 06/19/2018   Procedure: LAPAROSCOPY DIAGNOSTIC;  Surgeon: Greer Pickerel, MD;  Location: [facility];  Service: General;  Laterality: N/A;  . LAPAROSCOPY N/A  03/19/2019   Procedure: LAPAROSCOPY DIAGNOSTIC lysis of adhesions;  Surgeon: Clovis Riley, MD;  Location: [facility];  Service: General;  Laterality: N/A;  . LUMBAR LAMINECTOMY/DECOMPRESSION MICRODISCECTOMY Right 10/11/2018   Procedure: LAMINECTOMY AND FORAMINOTOMY RIGHT LUMBAR FOUR- LUMBAR FIVE;  Surgeon: Consuella Lose, MD;  Location: [facility];  Service: Neurosurgery;  Laterality: Right;  . SHOULDER ARTHROSCOPY  7/12   left-dsc  . TONSILLECTOMY  at age 66  . TRIGGER FINGER RELEASE  12/20/2012   Procedure: RELEASE TRIGGER FINGER/A-1 PULLEY;  Surgeon: Tennis Must, MD;  Location: [facility];  Service: Orthopedics;  Laterality:  Left;  LEFT TRIGGER THUMB RELEASE    Family History  Problem Relation Age of Onset  . Asthma Father   . Allergies Father   . Heart disease Father        enlarged heart  . Peripheral vascular disease Father   . Diabetes Father   . Hyperlipidemia Father   . Arthritis Father   . Asthma Sister   . Cancer Sister        colon at 59 yr old.  . Colon cancer Sister   . Allergies Mother   . Stroke Mother 52       with hemi paralysis  . Diabetes Mother   . Hyperlipidemia Mother   . Hypertension Mother   . GI problems Mother   . Arthritis Mother   . Allergies Brother   . Early death Brother 9       congenital abormality  . Allergies Sister   . Diabetes Sister   . Asthma Sister   . Colon polyps Sister   . Hyperlipidemia Sister   . GI problems Sister        gastroporesis   . Liver disease Sister        fatty liver  . Stroke Sister        intercrandial bleed  . Diabetes Brother   . Hypertension Brother   . Hyperlipidemia Brother   . Heart disease Paternal Aunt   . Migraines Neg Hx    Social History:  reports that she has never smoked. She has never used smokeless tobacco. She reports that she does not drink alcohol or use drugs.  Allergies:  Allergies  Allergen Reactions  . Ketoconazole Hives and Swelling    SWELLING REACTION UNSPECIFIED   . Pravachol [Pravastatin Sodium] Shortness Of Breath, Swelling and Anaphylaxis    Throat swelling  . Statins Other (See Comments)    Leg cramps  . Tape Dermatitis and Other (See Comments)    Steri-Strips  . Codeine Hypertension    increased BP    Facility-Administered Medications Prior to Admission  Medication Dose Route Frequency Provider Last Rate Last Dose  . cyanocobalamin ((VITAMIN B-12)) injection 1,000 mcg  1,000 mcg Intramuscular Q30 days Ronnie Doss M, DO   1,000 mcg at 08/16/19 1330   Medications Prior to Admission  Medication Sig Dispense Refill  . acetaminophen (TYLENOL) 500 MG tablet Take 1,000 mg by mouth  every 4 (four) hours as needed for moderate pain.    Marland Kitchen azelastine (ASTELIN) 0.1 % nasal spray Place 1 spray into both nostrils 2 (two) times daily. 30 mL 12  . Calcium-Vitamin D-Vitamin K 650-12.5-40 MG-MCG-MCG CHEW Chew 1 tablet by mouth 2 (two) times daily.    . cyclobenzaprine (FLEXERIL) 10 MG tablet     . dicyclomine (BENTYL) 20 MG tablet Take 20 mg by mouth every 8 (eight) hours as needed for spasms. For  back Spasm    . divalproex (DEPAKOTE) 500 MG DR tablet TAKE 1 TABLET EVERY MORNING AND 2 TABLETS AT BEDTIME 90 tablet 5  . docusate sodium (COLACE) 100 MG capsule Take 1 capsule (100 mg total) by mouth 2 (two) times daily. 30 capsule 0  . fluticasone (FLONASE) 50 MCG/ACT nasal spray Place 2 sprays into both nostrils 2 (two) times daily as needed for allergies. 16 g 4  . Fremanezumab-vfrm (AJOVY) 225 MG/1.5ML SOSY Inject 140 mg into the skin every 30 (thirty) days. 1 Syringe 11  . gabapentin (NEURONTIN) 300 MG capsule Take 2 tabs PO in the AM, 1 tab at lunch and 2 tabs at bedtime. (Patient taking differently: Take 300-600 mg by mouth See admin instructions. Take 600mg  by mouth in the morning, 300mg  at lunch and 600mg  at bedtime.) 450 capsule 3  . glucose blood (ONE TOUCH ULTRA TEST) test strip CHECK BLOOD SUGAR FOUR TIMES A DAY.  Dx E16.2 E11.649 400 each 3  . levothyroxine (SYNTHROID) 112 MCG tablet TAKE 1 TABLET BEFORE BREAKFAST 90 tablet 1  . LINZESS 145 MCG CAPS capsule     . loratadine (CLARITIN) 10 MG tablet Take 10 mg by mouth daily as needed for allergies.     . Multiple Vitamin (MULTIVITAMIN) capsule Take 1 capsule by mouth daily.     Marland Kitchen nystatin (NYAMYC) powder APPLY TOPICALLY TWICE A DAY AS NEEDED 30 g 0  . ondansetron (ZOFRAN) 4 MG tablet Take 1 tablet (4 mg total) by mouth every 8 (eight) hours. 90 tablet 11  . pantoprazole (PROTONIX) 40 MG tablet Take 1 tablet (40 mg total) by mouth daily. 90 tablet 1  . PAZEO 0.7 % SOLN Place 1 drop into both eyes every morning.    . polyethylene  glycol (MIRALAX / GLYCOLAX) packet Take 17 g by mouth at bedtime.     . Probiotic Product (PROBIOTIC DAILY PO) Take 1 capsule by mouth daily.    . RESTASIS MULTIDOSE 0.05 % ophthalmic emulsion Place 1 drop into both eyes 2 (two) times daily.    . sertraline (ZOLOFT) 100 MG tablet Take 2 tablets (200 mg total) by mouth daily. 180 tablet 3  . tiZANidine (ZANAFLEX) 2 MG tablet Take 1 tablet (2 mg total) by mouth every 8 (eight) hours as needed for muscle spasms. 60 tablet 0  . ziprasidone (GEODON) 60 MG capsule Take 1 capsule (60 mg total) by mouth at bedtime. 90 capsule 1  . Glucagon, rDNA, (GLUCAGON EMERGENCY IJ) Inject 1 Syringe as directed as needed (emergency low blood sugar).      Results for orders placed or performed during the hospital encounter of 09/24/19 (from the past 48 hour(s))  Glucose, capillary     Status: Abnormal   Collection Time: 09/24/19  6:55 AM  Result Value Ref Range   Glucose-Capillary 64 (L) 70 - 99 mg/dL   No results found.  Review of Systems  Constitutional: Negative.   HENT: Negative.   Eyes: Negative.   Respiratory: Negative.   Cardiovascular: Negative.   Gastrointestinal: Negative.   Musculoskeletal:       Right ankle pain  Skin: Negative.   Neurological: Negative.   Psychiatric/Behavioral: Negative.     Blood pressure 118/77, pulse 72, temperature (!) 97.5 F (36.4 C), temperature source Oral, resp. rate 15, height 5\' 4"  (1.626 m), weight 70.2 kg, last menstrual period 10/17/2017, SpO2 100 %. Physical Exam  Vitals reviewed. Constitutional: She appears well-developed.  Eyes: Conjunctivae are normal.  Neck: Neck supple.  Cardiovascular: Normal rate.  Respiratory: Effort normal.  GI: Soft.  Musculoskeletal:     Comments: Right ankle demonstrates tenderness to palpation of the anterolateral ankle.  Mild anterior ankle swelling.  She has tenderness along the peroneal tendons.  No peroneal tendon subluxation on exam.  No significant tenderness on the  anteromedial ankle.  No obvious gross instability about the ankle with anterior drawer with internal rotation moment.  Sensation intact in the dorsal plantar foot.  Palpable dorsalis pedis pulse.  Neurological: She is alert.  Skin: Skin is warm.  Psychiatric: She has a normal mood and affect.     Assessment/Plan We will proceed with arthroscopic exploration and debridement of her right ankle and treatment of osteochondral lesion if needed.  We will also explore her peroneal tendons and repair as needed.  We will check stability during the arthroscopic portion of the case to determine need for ligament reconstruction.  Postoperatively she will be nonweightbearing in a splint.  She understands the risk, benefits alternatives that were discussed and include but are not limited to wound healing complications, infection, continued pain, progression of arthritis, damage to surrounding structures and need for further surgery.  After weighing these risks he wished to proceed with surgery.  Erle Crocker, MD 09/24/2019, 6:59 AM

## 2019-09-24 NOTE — Transfer of Care (Signed)
Immediate Anesthesia Transfer of Care Note  Patient: Morgan [name]  Procedure(s) Performed: ANKLE ARTHROSCOPY DEBRIDEMENT TREATMENT OF OSTEOCHONDRAL LESION TALUS. PRONEAL TENDON DEBRIDEMENT (Right Ankle)  Patient Location: PACU  Anesthesia Type:General and Regional  Level of Consciousness: awake, alert  and oriented  Airway & Oxygen Therapy: Patient Spontanous Breathing and Patient connected to face mask oxygen  Post-op Assessment: Report given to RN and Post -op Vital signs reviewed and stable  Post vital signs: Reviewed and stable  Last Vitals:  Vitals Value Taken Time  BP    Temp    Pulse    Resp    SpO2      Last Pain:  Vitals:   09/24/19 0721  TempSrc:   PainSc: 0-No pain         Complications: No apparent anesthesia complications

## 2019-09-24 NOTE — Anesthesia Procedure Notes (Signed)
Procedure Name: LMA Insertion Date/Time: 09/24/2019 7:36 AM Performed by: Lieutenant Diego, CRNA Pre-anesthesia Checklist: Patient identified, Emergency Drugs available, Suction available and Patient being monitored Patient Re-evaluated:Patient Re-evaluated prior to induction Oxygen Delivery Method: Circle system utilized Preoxygenation: Pre-oxygenation with 100% oxygen Induction Type: IV induction Ventilation: Mask ventilation without difficulty LMA: LMA inserted LMA Size: 4.0 Number of attempts: 1 Placement Confirmation: positive ETCO2 and breath sounds checked- equal and bilateral Tube secured with: Tape Dental Injury: Teeth and Oropharynx as per pre-operative assessment

## 2019-09-24 NOTE — Progress Notes (Signed)
Assisted Dr. Oddono with right, ultrasound guided, popliteal/saphenous block. Side rails up, monitors on throughout procedure. See vital signs in flow sheet. Tolerated Procedure well. 

## 2019-09-24 NOTE — Op Note (Signed)
Morgan [name] female 53 y.o. 09/24/2019  PreOperative Diagnosis: Right ankle impingement and synovitis Right ankle peroneal tendinosis  PostOperative Diagnosis: Right ankle impingement and synovitis Right ankle loose osteochondral fragment Right ankle peroneal tendinosis  PROCEDURE: Right ankle arthroscopic debridement extensive Right ankle peroneal tendon debridement with excision of low-lying peroneus brevis muscle belly  SURGEON: Melony Overly, MD  ASSISTANT: None  ANESTHESIA: General LMA with peripheral nerve blockade  FINDINGS: Large loose osteochondral fragment of the anterior lateral tibial plafond with extensive synovitis about the ankle joint Right peroneal tendinosis with low-lying peroneus brevis muscle belly  IMPLANTS: None  INDICATIONS:53 y.o. female had significant pain in her right ankle that was recalcitrant to conservative treatment for boot immobilization, physical therapy, anti-inflammatories and activity modifications.  MRI revealed evidence of talar dome edema with chondrosis.  There was anterior distal tibial plafond osteophyte.  She also had evidence of synovitis and possible tearing of the peroneal tendons had pain along the peroneal tendons on exam.  She was indicated for the above treatment because of this.  Patient understood the risks, benefits alternatives of surgery which were discussed and included but were not limited to wound healing complications, infection, continued pain, progression of arthritis, need for further surgery demonstrating structures.  After weighing these risks he opted proceed with surgery.  She also understood the perioperative and anesthetic risk which include death.  PROCEDURE:Patient was identified in the preoperative holding area.  The right leg was marked myself.  The consent was signed myself and the patient.  Peripheral nerve blockade was performed by anesthesia.  She was taken the operative suite placed supine  operative table.  Thigh tourniquet was placed on the right thigh after LMA anesthesia was induced without difficulty.  Preoperative antibiotics were given.  A bump was placed on the right hip and all bony prominences were well-padded.  The right lower extremity was prepped and draped in the usual sterile fashion.  Surgical timeout was performed we began by elevating the limb and inflating the tourniquet to 250 mmHg.  The ankle distractor was placed.  We began by making an anteromedial portal to the ankle joint.  This was done with an 11 blade through the skin.  Then blunt dissection was used with a hemostat down to the capsule.  Then the capsule was violated.  Then the trocar with the camera was placed.  There is a large amount of synovitis within the ankle joint.  The ankle joint was identified.  Then a lateral portal was placed in a similar fashion.  The probe was placed to remove the synovitic tissue and the joint surfaces were inspected.  There is a large anterolateral tibial spur with a loose bone fragment in the anterior tibia.  There is evidence of chondrosis to the anterolateral aspect of the tibial plafond and talar dome.  There is also evidence of anteromedial tibial plafond chondrosis.  Then the probe was removed and the shaver was inserted.  Extensive debridement was done of the ankle joint including synovial tissue the loose bony fragment and osteophyte on the anterior distal tibial follow-up plafond.  The anterior portion of the ankle joint was extensively debrided to gain better visualization of the ankle joint.  Then the cartilage surfaces of the tibial plafond and posterior aspect of the talar dome medially and laterally were inspected.  There is no significant cartilage wear noted and no obvious fissures within the cartilage or osteochondral defect.  The syndesmosis area was inspected and found to be intact  with a mild amount of synovitis.  The medial gutter was out without evidence of  chondrosis.  There is small amount of synovitic tissue within the lateral gutter which was debrided with the shaver.  Then the ankle distractor was removed and ankle was checked for stability.  It was found to be stable with regard to anterior drawer and talar tilt testing.  There is no obvious syndesmotic instability.  This completed the arthroscopic debridement portion of the case.  We then made a longitudinal incision along the line of the peroneal tendons in the retrofibular area.  This taken sharply down through skin and subcutaneous tissue.  The peroneal tendon sheath and superior peroneal retinaculum tissue was identified and a small nick was made in the sheath proximal to the superior peroneal retinaculum.  The sheath was opened up proximally and the superior peroneal retinacular tissue was opened as well.  The peroneus longus tendon was inspected and found to be without tear.  This was then retracted out of the way and the peroneus brevis tendon was identified.  There is a low-lying muscle belly and synovitic tissue and adhesions with the peroneus brevis and the surrounding tendon sheath.  The adhesions and low-lying muscle belly was excised sharply with scissors and a 15 blade and removed.  And the tendon was reinspected there is found to be no tears.  The tendons were replaced back into the retrofibular groove and found to seat well within this area.  Then the superior peroneal retinacular tissue was repaired using a 2-0 FiberWire stitch.  Then the remaining peroneus retinacular sheath was closed with a 2-0 PDS suture.  Then the wound was irrigated copiously with normal saline and the tourniquet was released.  Hemostasis was obtained.  Subcuticular tissue was closed with 3-0 Monocryl and the skin with a 3-0 nylon.  The portals were closed with a 3-0 nylon.  The leg was cleaned and the wounds were covered with Xeroform and a soft dressing.  She was placed in a nonweightbearing short leg splint.  She  was awakened from anesthesia and taken recovery in stable condition.  All counts were correct at the end the case.  There was no complications.  POST OPERATIVE INSTRUCTIONS: Keep splint dry and in place Nonweightbearing to right lower extremity Call the office with concerns She will follow-up in 2 weeks for splint removal, suture removal and likely placement of a walking boot No need for DVT prophylaxis.   BLOOD LOSS:  Minimal         DRAINS: none         SPECIMEN: none       COMPLICATIONS:  * No complications entered in OR log *         Disposition: PACU - hemodynamically stable.         Condition: stable

## 2019-09-24 NOTE — Anesthesia Procedure Notes (Signed)
Anesthesia Regional Block: Popliteal block   Pre-Anesthetic Checklist: ,, timeout performed, Correct Patient, Correct Site, Correct Laterality, Correct Procedure, Correct Position, site marked, Risks and benefits discussed,  Surgical consent,  Pre-op evaluation,  At surgeon's request and post-op pain management  Laterality: Right  Prep: chloraprep       Needles:  Injection technique: Single-shot  Needle Type: Echogenic Stimulator Needle     Needle Length: 5cm  Needle Gauge: 22     Additional Needles:   Procedures:, nerve stimulator,,, ultrasound used (permanent image in chart),,,,   Nerve Stimulator or Paresthesia:  Response: gastroc, 0.45 mA,   Additional Responses:   Narrative:  Start time: 09/24/2019 7:18 AM End time: 09/24/2019 7:24 AM Injection made incrementally with aspirations every 5 mL.  Performed by: Personally  Anesthesiologist: Janeece Riggers, MD  Additional Notes: Functioning IV was confirmed and monitors were applied.  A 4mm 22ga Arrow echogenic stimulator needle was used. Sterile prep and drape,hand hygiene and sterile gloves were used. Ultrasound guidance: relevant anatomy identified, needle position confirmed, local anesthetic spread visualized around nerve(s)., vascular puncture avoided.  Image printed for medical record. Negative aspiration and negative test dose prior to incremental administration of local anesthetic. The patient tolerated the procedure well.

## 2019-09-24 NOTE — Discharge Instructions (Signed)
DR. [facility] POST-OP INSTRUCTIONS   Pain Management 1. The numbing medicine and your leg will last around 18 hours, take a dose of your pain medicine as soon as you feel it wearing off to avoid rebound pain. 2. Keep your foot elevated above heart level.  Make sure that your heel hangs free ('floats'). 3. Take all prescribed medication as directed. 4. If taking narcotic pain medication you may want to use an over-the-counter stool softener to avoid constipation. 5. You may take over-the-counter NSAIDs (ibuprofen, naproxen, etc.) as well as over-the-counter acetaminophen as directed on the packaging as a supplement for your pain and may also use it to wean away from the prescription medication.  Activity ? Non-weightbearing ? Keep splint intact  First Postoperative Visit 1. Your first postop visit will be at least 2 weeks after surgery.  This should be scheduled when you schedule surgery. 2. If you do not have a postoperative visit scheduled please call [phone] to schedule an appointment. 3. At the appointment your incision will be evaluated for suture removal, x-rays will be obtained if necessary.  General Instructions 1. Swelling is very common after foot and ankle surgery.  It often takes 3 months for the foot and ankle to begin to feel comfortable.  Some amount of swelling will persist for 6-12 months. 2. DO NOT change the dressing.  If there is a problem with the dressing (too tight, loose, gets wet, etc.) please contact Dr. Adair's office. 3. DO NOT get the dressing wet.  For showers you can use an over-the-counter cast cover or wrap a washcloth around the top of your dressing and then cover it with a plastic bag and tape it to your leg. 4. DO NOT soak the incision (no tubs, pools, bath, etc.) until you have approval from Dr. Adair.  Contact Dr. Adairs office or go to Emergency Room if: 1. Temperature above 101 F. 2. Increasing pain that is unresponsive to pain  medication or elevation 3. Excessive redness or swelling in your foot 4. Dressing problems - excessive bloody drainage, looseness or tightness, or if dressing gets wet 5. Develop pain, swelling, warmth, or discoloration of your calf   Post Anesthesia Home Care Instructions  Activity: Get plenty of rest for the remainder of the day. A responsible individual must stay with you for 24 hours following the procedure.  For the next 24 hours, DO NOT: -Drive a car -Operate machinery -Drink alcoholic beverages -Take any medication unless instructed by your physician -Make any legal decisions or sign important papers.  Meals: Start with liquid foods such as gelatin or soup. Progress to regular foods as tolerated. Avoid greasy, spicy, heavy foods. If nausea and/or vomiting occur, drink only clear liquids until the nausea and/or vomiting subsides. Call your physician if vomiting continues.  Special Instructions/Symptoms: Your throat may feel dry or sore from the anesthesia or the breathing tube placed in your throat during surgery. If this causes discomfort, gargle with warm salt water. The discomfort should disappear within 24 hours.  If you had a scopolamine patch placed behind your ear for the management of post- operative nausea and/or vomiting:  1. The medication in the patch is effective for 72 hours, after which it should be removed.  Wrap patch in a tissue and discard in the trash. Wash hands thoroughly with soap and water. 2. You may remove the patch earlier than 72 hours if you experience unpleasant side effects which may include dry mouth, dizziness   or visual disturbances. 3. Avoid touching the patch. Wash your hands with soap and water after contact with the patch.     Regional Anesthesia Blocks  1. Numbness or the inability to move the "blocked" extremity may last from 3-48 hours after placement. The length of time depends on the medication injected and your individual response to  the medication. If the numbness is not going away after 48 hours, call your surgeon.  2. The extremity that is blocked will need to be protected until the numbness is gone and the  Strength has returned. Because you cannot feel it, you will need to take extra care to avoid injury. Because it may be weak, you may have difficulty moving it or using it. You may not know what position it is in without looking at it while the block is in effect.  3. For blocks in the legs and feet, returning to weight bearing and walking needs to be done carefully. You will need to wait until the numbness is entirely gone and the strength has returned. You should be able to move your leg and foot normally before you try and bear weight or walk. You will need someone to be with you when you first try to ensure you do not fall and possibly risk injury.  4. Bruising and tenderness at the needle site are common side effects and will resolve in a few days.  5. Persistent numbness or new problems with movement should be communicated to the surgeon or the [facility] ([phone])/ [facility] ([phone]).  

## 2019-09-25 ENCOUNTER — Encounter (HOSPITAL_BASED_OUTPATIENT_CLINIC_OR_DEPARTMENT_OTHER): Payer: Self-pay | Admitting: Orthopaedic Surgery

## 2019-09-25 NOTE — Anesthesia Postprocedure Evaluation (Signed)
Anesthesia Post Note  Patient: Morgan [name]  Procedure(s) Performed: ANKLE ARTHROSCOPY DEBRIDEMENT TREATMENT OF OSTEOCHONDRAL LESION TALUS. PRONEAL TENDON DEBRIDEMENT (Right Ankle)     Patient location during evaluation: PACU Anesthesia Type: General Level of consciousness: awake and alert Pain management: pain level controlled Vital Signs Assessment: post-procedure vital signs reviewed and stable Respiratory status: spontaneous breathing, nonlabored ventilation, respiratory function stable and patient connected to nasal cannula oxygen Cardiovascular status: blood pressure returned to baseline and stable Postop Assessment: no apparent nausea or vomiting Anesthetic complications: no    Last Vitals:  Vitals:   09/24/19 0930 09/24/19 1000  BP: 98/67 103/67  Pulse: 72 76  Resp: 10 16  Temp:  36.4 C  SpO2: 98% 96%    Last Pain:  Vitals:   09/24/19 1000  TempSrc:   PainSc: 3                  Alfreida Steffenhagen

## 2019-10-09 DIAGNOSIS — M7671 Peroneal tendinitis, right leg: Secondary | ICD-10-CM | POA: Diagnosis not present

## 2019-10-09 DIAGNOSIS — M24071 Loose body in right ankle: Secondary | ICD-10-CM | POA: Diagnosis not present

## 2019-10-15 ENCOUNTER — Other Ambulatory Visit: Payer: Self-pay | Admitting: Adult Health

## 2019-10-22 ENCOUNTER — Telehealth: Payer: Self-pay | Admitting: Family Medicine

## 2019-10-22 NOTE — Chronic Care Management (AMB) (Signed)
Chronic Care Management   Note  10/22/2019 Name: DIAMON REDDINGER MRN: [id number] DOB: 09/18/1966  Morgan [name] is a 53 y.o. year old female who is a primary care patient of Janora Norlander, DO. I reached out to [name] by phone today in response to a referral sent by Ms. [employer] health plan.     Ms. Cosper was given information about Chronic Care Management services today including:  1. CCM service includes personalized support from designated clinical staff supervised by her physician, including individualized plan of care and coordination with other care providers 2. 24/7 contact phone numbers for assistance for urgent and routine care needs. 3. Service will only be billed when office clinical staff spend 20 minutes or more in a month to coordinate care. 4. Only one practitioner may furnish and bill the service in a calendar month. 5. The patient may stop CCM services at any time (effective at the end of the month) by phone call to the office staff. 6. The patient will be responsible for cost sharing (co-pay) of up to 20% of the service fee (after annual deductible is met).  Patient agreed to services and verbal consent obtained.   Follow up plan: Telephone appointment with CCM team member scheduled for: 10/22/2019  [place], [place] [facility]  [place], [place] [id number] Direct Dial: [email or web address]  Website: [email or web address]

## 2019-10-23 ENCOUNTER — Ambulatory Visit: Payer: Medicare Other | Admitting: *Deleted

## 2019-10-23 DIAGNOSIS — E119 Type 2 diabetes mellitus without complications: Secondary | ICD-10-CM

## 2019-10-23 DIAGNOSIS — E785 Hyperlipidemia, unspecified: Secondary | ICD-10-CM

## 2019-10-29 DIAGNOSIS — Z9884 Bariatric surgery status: Secondary | ICD-10-CM | POA: Diagnosis not present

## 2019-10-29 DIAGNOSIS — E1169 Type 2 diabetes mellitus with other specified complication: Secondary | ICD-10-CM | POA: Diagnosis not present

## 2019-10-29 DIAGNOSIS — E162 Hypoglycemia, unspecified: Secondary | ICD-10-CM | POA: Diagnosis not present

## 2019-10-31 ENCOUNTER — Other Ambulatory Visit: Payer: Self-pay | Admitting: Neurology

## 2019-11-04 ENCOUNTER — Other Ambulatory Visit: Payer: Self-pay | Admitting: Family Medicine

## 2019-11-11 ENCOUNTER — Other Ambulatory Visit: Payer: Self-pay | Admitting: Family Medicine

## 2019-11-11 DIAGNOSIS — K219 Gastro-esophageal reflux disease without esophagitis: Secondary | ICD-10-CM

## 2019-11-14 ENCOUNTER — Ambulatory Visit: Payer: Medicare Other | Attending: Orthopaedic Surgery | Admitting: Physical Therapy

## 2019-11-14 ENCOUNTER — Encounter: Payer: Self-pay | Admitting: Physical Therapy

## 2019-11-14 ENCOUNTER — Other Ambulatory Visit: Payer: Self-pay

## 2019-11-14 DIAGNOSIS — M25671 Stiffness of right ankle, not elsewhere classified: Secondary | ICD-10-CM | POA: Insufficient documentation

## 2019-11-14 DIAGNOSIS — M25571 Pain in right ankle and joints of right foot: Secondary | ICD-10-CM | POA: Insufficient documentation

## 2019-11-14 NOTE — Therapy (Signed)
[facility] [place], [place], [address] Phone: [phone]   Fax:  [phone]  Physical Therapy Evaluation  Patient Details  Name: Morgan [name] MRN: [id number] Date of Birth: [birth year] Referring Provider (PT): Radene Journey MD   Encounter Date: 11/14/2019  PT End of Session - 11/14/19 1314    Visit Number  1    Number of Visits  12    Date for PT Re-Evaluation  12/26/19    Authorization Type  FOTO AT LEAST EVERY 5TH VISIT.  PROGRESS NOTE AT 10TH VISIT.    PT Start Time  1255    PT Stop Time  1332    PT Time Calculation (min)  37 min    Activity Tolerance  Patient tolerated treatment well    Behavior During Therapy  WFL for tasks assessed/performed       Past Medical History:  Diagnosis Date  . Allergic rhinitis   . Anemia     after gastric bypass in 2018  . Anxiety   . Barrett's esophagus   . Bipolar affective disorder ([place])   . CAP (community acquired pneumonia) 07/20/2018  . Chronic respiratory failure ([place])   . Complication of anesthesia    "they had a hard time keeping my blood sugar up after back surgery in April 2020" per pt  . Constipation, chronic   . Degenerative joint disease of spine   . Depression   . Diabetes mellitus without complication ([place])    type 2   . DM (diabetes mellitus) ([place])    "hypoglycemic" per pt - no longer takes DM meds due to weight loss from gastric bypass in 2018  . Dry eye   . Family history of adverse reaction to anesthesia    nausea and vomiting  . Gastroparesis   . GERD (gastroesophageal reflux disease)   . History of bariatric surgery 03/2017  . History of hiatal hernia   . HLD (hyperlipidemia) 03/12/2013  . Hyperlipidemia   . Hypertension    No HTN meds since weight loss from Gastric Bypass in 2018  . Intractable chronic migraine without aura 06/04/2015  . Migraine headache   . Morbid obesity ([place])   . OSA (obstructive sleep apnea)    No cpap since gastric  surgery  . Osteoarthritis    bilateral knee  . SBO (small bowel obstruction) ([place]) 03/19/2019  . Sleep apnea   . Unspecified hypothyroidism   . Vitamin B 12 deficiency   . Vitamin D deficiency     Past Surgical History:  Procedure Laterality Date  . ANKLE ARTHROSCOPY WITH RECONSTRUCTION Right 09/24/2019   Procedure: ANKLE ARTHROSCOPY DEBRIDEMENT TREATMENT OF OSTEOCHONDRAL LESION TALUS. PRONEAL TENDON DEBRIDEMENT;  Surgeon: [facility] Crocker, MD;  Location: [facility];  Service: Orthopedics;  Laterality: Right;  SURGERY REQUEST TIME 2 HOURS  . APPENDECTOMY  1978  . CHOLECYSTECTOMY  2005  . COLONOSCOPY    . disc repair  05/17/2019   with rods, lumbar spine  . GASTRIC ROUX-EN-Y N/A 03/21/2017   Procedure: LAPAROSCOPIC ROUX-EN-Y GASTRIC, UPPER ENDO;  Surgeon: [facility]er Pickerel, MD;  [facility];  Service: General;  Laterality: N/A;  . GASTROSTOMY N/A 06/19/2018   Procedure: LAPRASCOPIC INSERTION OF GASTROSTOMY TUBE;  Surgeon: Greer Pickerel, MD;  [facility];  Service: General;  Laterality: N/A;  . GASTROSTOMY TUBE PLACEMENT Left    06/2018  . HERNIA REPAIR    . HIATAL HERNIA REPAIR N/A 06/19/2018   Procedure: LAPAROSCOPIC REPAIR OF  HIATAL HERNIA;  Surgeon: Greer Pickerel, MD;  [facility];  Service: General;  Laterality: N/A;  . LAPAROSCOPIC LYSIS OF ADHESIONS  03/19/2019   Dr. Romana Juniper  . LAPAROSCOPY N/A 06/19/2018   Procedure: LAPAROSCOPY DIAGNOSTIC;  Surgeon: Greer Pickerel, MD;  [facility];  Service: General;  Laterality: N/A;  . LAPAROSCOPY N/A 03/19/2019   Procedure: LAPAROSCOPY DIAGNOSTIC lysis of adhesions;  Surgeon: [place] Riley, MD;  [facility];  Service: General;  Laterality: N/A;  . LUMBAR LAMINECTOMY/DECOMPRESSION MICRODISCECTOMY Right 10/11/2018   Procedure: LAMINECTOMY AND FORAMINOTOMY RIGHT LUMBAR FOUR- LUMBAR FIVE;  Surgeon: Consuella Lose, MD;  Location: [facility];  Service: Neurosurgery;  Laterality: Right;  . SHOULDER ARTHROSCOPY   7/12   left-dsc  . TONSILLECTOMY  at age 29  . TRIGGER FINGER RELEASE  12/20/2012   Procedure: RELEASE TRIGGER FINGER/A-1 PULLEY;  Surgeon: Tennis Must, MD;  Location: [facility];  Service: Orthopedics;  Laterality: Left;  LEFT TRIGGER THUMB RELEASE    There were no vitals filed for this visit.   Subjective Assessment - 11/14/19 1314    Subjective  COVID-19 screen performed prior to patient entering clinic.  The patient underwent a right ankle surgery on 09/24/19 including peroneal tendon debridement and a repair of a talus osteochondral lesion.  She is in a CAM boot/walker today and is to be for another two weeks at which time she can transition to an ASO.  She is currently performing an HEP comprised of ankle pumps and inversion and eversion.    Pertinent History  Lumbar surgery x 2, DM, left shoulder surgery.    Patient Stated Goals  Get out of boot and walk without pain.    Currently in Pain?  Yes    Pain Score  4     Pain Location  Ankle    Pain Orientation  Right    Pain Descriptors / Indicators  Aching;Sore;Numbness    Pain Type  Surgical pain    Pain Onset  More than a month ago    Pain Frequency  Constant    Aggravating Factors   Increased up time.    Pain Relieving Factors  Rest.         [facility] PT Assessment - 11/14/19 0001      Assessment   Medical Diagnosis  Right ankle ATS.    Referring Provider (PT)  Radene Journey MD    Onset Date/Surgical Date  --   09/24/19 (surgery date).     Precautions   Precautions  --   Currently in CAM boot/walker.     Restrictions   Weight Bearing Restrictions  --   WBAT over right LE.     Balance Screen   Has the patient fallen in the past 6 months  No    Has the patient had a decrease in activity level because of a fear of falling?   No    Is the patient reluctant to leave their home because of a fear of falling?   No      Home Environment   Living Environment  Private residence      Prior Function   Level of  Independence  Independent      Observation/Other Assessments   Observations  Right ankle lateral incision looks to be healing well with small nodule on proximal end of incision.    Focus on Therapeutic Outcomes (FOTO)   56% limitation.      Observation/Other Assessments-Edema  Edema  --   Minimal edema near right lateral malleolus.     ROM / Strength   AROM / PROM / Strength  AROM      AROM   Overall AROM Comments  Active right ankle dorsiflexion= 6 degrees, plantarflexion= 40 degrees, inversion= 20 degrees and 8 degrees eversion.      Palpation   Palpation comment  Tender to palption over right ankle lateral incisional site.      Ambulation/Gait   Gait Comments  Patient walking wbat over right LE with a CAM walker/boot.                Objective measurements completed on examination: See above findings.      [facility] Adult PT Treatment/Exercise - 11/14/19 0001      Modalities   Modalities  Vasopneumatic      Vasopneumatic   Number Minutes Vasopneumatic   10 minutes    Vasopnuematic Location   --   Right ankle.   Vasopneumatic Pressure  Low                  PT Long Term Goals - 11/14/19 1356      PT LONG TERM GOAL #1   Title  Independent with a HEP.    Time  6    Period  Weeks    Status  New      PT LONG TERM GOAL #2   Title  Increase right ankle dorsiflexion to 8-10 degrees to normalize the patient's gait pattern.    Time  6    Period  Weeks    Status  New      PT LONG TERM GOAL #3   Title  Increase ankle strength to 4+ to 5/5 to increase stability for functional tasks.    Time  6    Period  Weeks    Status  New      PT LONG TERM GOAL #4   Title  Walk in clinic without brace and no deviations.    Time  6    Period  Weeks    Status  New             Plan - 11/14/19 1345    Clinical Impression Statement  The patient presents to OPPT having undergone a right ankle arthroscopic surgery that included extensive arthroscopic  debridement of the peroneal tendon with excision of low-lying peroneus brevis muscle belly.  She is doing well and has been performing ankle pumps and inversion and eversion.  She is walking weight bearing as tolerated over her right LE with a CAM boot/walker.  She has some expected loss of range of motion and some palpable tenderness over her right lateral ankle incisional site.  She has a minimal amount of edema currently.  Her functional mobility is impaired.  Patient will benefit from skilled physical therapy intervention to address deficits and pain.    Personal Factors and Comorbidities  Comorbidity 1    Comorbidities  Lumbar surgery x 2, DM, left shoulder surgery.    Examination-Activity Limitations  Locomotion Level;Other    Stability/Clinical Decision Making  Stable/Uncomplicated    Clinical Decision Making  Low    Rehab Potential  Excellent    PT Frequency  2x / week    PT Duration  6 weeks    PT Treatment/Interventions  ADLs/Self Care Home Management;Cryotherapy;Electrical Stimulation;Gait training;Ultrasound;Moist Heat;Iontophoresis 4mg /ml Dexamethasone;Therapeutic exercise;Neuromuscular re-education;Therapeutic activities;Patient/family education;Manual techniques;Passive range of motion;Vasopneumatic Device    PT Next Visit  Plan  Seated BAPS and Rockerboard, gentle range of motion, isometrics, yellow theraband 4 way ankle exercise.  Modalities as needed.    Consulted and Agree with Plan of Care  Patient       Patient will benefit from skilled therapeutic intervention in order to improve the following deficits and impairments:  Pain, Decreased activity tolerance, Decreased range of motion  Visit Diagnosis: Pain in right ankle and joints of right foot - Plan: PT plan of care cert/re-cert  Stiffness of right ankle, not elsewhere classified - Plan: PT plan of care cert/re-cert     Problem List Patient Active Problem List   Diagnosis Date Noted  . AMS (altered mental status)  05/19/2019  . Thrombocytopenia ([place]) 05/19/2019  . SBO (small bowel obstruction) s/p lap LOA 03/19/2019 03/19/2019  . Chronic migraine without aura, with intractable migraine, so stated, with status migrainosus 11/20/2018  . Lumbar radiculopathy 10/11/2018  . Atelectasis   . Hypoglycemia   . Hypotension   . Bradycardia   . Epigastric pain 06/19/2018  . History of adenomatous polyp of colon 10/23/2017  . Hyperlipidemia associated with type 2 diabetes mellitus ([place]) 07/06/2017  . GERD (gastroesophageal reflux disease) 03/21/2017  . History of Roux-en-Y gastric bypass 2018 03/21/2017  . Morbid obesity with BMI of 40.0-44.9, adult ([place]) 06/06/2016  . Intractable chronic migraine without aura 06/04/2015  . Abnormal uterine bleeding 05/13/2015  . Pancreatitis 02/27/2014  . Fatty liver disease, nonalcoholic [id number]  . Bipolar disorder ([place]) 02/20/2014  . Metabolic syndrome [id number]  . Vitamin D deficiency   . DM (diabetes mellitus) ([place]) 03/12/2013  . Surgery, elective 06/12/2011  . Hypothyroidism   . Constipation, chronic   . Vitamin B 12 deficiency   . ALLERGIC RHINITIS 12/31/2010  . BARRETTS ESOPHAGUS 12/31/2010  . Gastroparesis 12/31/2010  . Bilateral chronic knee pain 12/31/2010  . Obstructive sleep apnea 09/27/2010  . Migraine 09/27/2010  . RESPIRATORY FAILURE, CHRONIC 09/27/2010  . Cyst of ovary 10/19/2009    APP[place], [place] MPT 11/14/2019, 2:00 PM  [facility] [place], [place], [address] Phone: [phone]   Fax:  [phone]  Name: Morgan [name] MRN: [id number] Date of Birth: [birth year]

## 2019-11-18 ENCOUNTER — Other Ambulatory Visit: Payer: Self-pay

## 2019-11-18 ENCOUNTER — Ambulatory Visit: Payer: Medicare Other | Admitting: Physical Therapy

## 2019-11-18 ENCOUNTER — Encounter: Payer: Self-pay | Admitting: Physical Therapy

## 2019-11-18 DIAGNOSIS — M25571 Pain in right ankle and joints of right foot: Secondary | ICD-10-CM | POA: Diagnosis not present

## 2019-11-18 DIAGNOSIS — M25671 Stiffness of right ankle, not elsewhere classified: Secondary | ICD-10-CM

## 2019-11-18 NOTE — Therapy (Signed)
[facility] [place], [place], [address] Phone: [phone]   Fax:  [phone]  Physical Therapy Treatment  Patient Details  Name: Morgan [name] MRN: [id number] Date of Birth: 05/29/1966 Referring Provider (PT): Radene Journey MD   Encounter Date: 11/18/2019  PT End of Session - 11/18/19 0956    Visit Number  2    Number of Visits  12    Date for PT Re-Evaluation  12/26/19    Authorization Type  FOTO AT LEAST EVERY 5TH VISIT.  PROGRESS NOTE AT 10TH VISIT.    PT Start Time  0900    PT Stop Time  0937    PT Time Calculation (min)  37 min    Activity Tolerance  Patient tolerated treatment well    Behavior During Therapy  WFL for tasks assessed/performed       Past Medical History:  Diagnosis Date  . Allergic rhinitis   . Anemia     after gastric bypass in 2018  . Anxiety   . Barrett's esophagus   . Bipolar affective disorder ([place])   . CAP (community acquired pneumonia) 07/20/2018  . Chronic respiratory failure ([place])   . Complication of anesthesia    "they had a hard time keeping my blood sugar up after back surgery in April 2020" per pt  . Constipation, chronic   . Degenerative joint disease of spine   . Depression   . Diabetes mellitus without complication ([place])    type 2   . DM (diabetes mellitus) ([place])    "hypoglycemic" per pt - no longer takes DM meds due to weight loss from gastric bypass in 2018  . Dry eye   . Family history of adverse reaction to anesthesia    nausea and vomiting  . Gastroparesis   . GERD (gastroesophageal reflux disease)   . History of bariatric surgery 03/2017  . History of hiatal hernia   . HLD (hyperlipidemia) 03/12/2013  . Hyperlipidemia   . Hypertension    No HTN meds since weight loss from Gastric Bypass in 2018  . Intractable chronic migraine without aura 06/04/2015  . Migraine headache   . Morbid obesity ([place])   . OSA (obstructive sleep apnea)    No cpap since gastric surgery   . Osteoarthritis    bilateral knee  . SBO (small bowel obstruction) ([place]) 03/19/2019  . Sleep apnea   . Unspecified hypothyroidism   . Vitamin B 12 deficiency   . Vitamin D deficiency     Past Surgical History:  Procedure Laterality Date  . ANKLE ARTHROSCOPY WITH RECONSTRUCTION Right 09/24/2019   Procedure: ANKLE ARTHROSCOPY DEBRIDEMENT TREATMENT OF OSTEOCHONDRAL LESION TALUS. PRONEAL TENDON DEBRIDEMENT;  Surgeon: [facility] Crocker, MD;  Location: [facility];  Service: Orthopedics;  Laterality: Right;  SURGERY REQUEST TIME 2 HOURS  . APPENDECTOMY  1978  . CHOLECYSTECTOMY  2005  . COLONOSCOPY    . disc repair  05/17/2019   with rods, lumbar spine  . GASTRIC ROUX-EN-Y N/A 03/21/2017   Procedure: LAPAROSCOPIC ROUX-EN-Y GASTRIC, UPPER ENDO;  Surgeon: [facility]er Pickerel, MD;  [facility];  Service: General;  Laterality: N/A;  . GASTROSTOMY N/A 06/19/2018   Procedure: LAPRASCOPIC INSERTION OF GASTROSTOMY TUBE;  Surgeon: Greer Pickerel, MD;  [facility];  Service: General;  Laterality: N/A;  . GASTROSTOMY TUBE PLACEMENT Left    06/2018  . HERNIA REPAIR    . HIATAL HERNIA REPAIR N/A 06/19/2018   Procedure: LAPAROSCOPIC REPAIR OF  HIATAL HERNIA;  Surgeon: [facility]er Pickerel, MD;  [facility];  Service: General;  Laterality: N/A;  . LAPAROSCOPIC LYSIS OF ADHESIONS  03/19/2019   Dr. Romana Juniper  . LAPAROSCOPY N/A 06/19/2018   Procedure: LAPAROSCOPY DIAGNOSTIC;  Surgeon: [facility] Pickerel, MD;  [facility];  Service: General;  Laterality: N/A;  . LAPAROSCOPY N/A 03/19/2019   Procedure: LAPAROSCOPY DIAGNOSTIC lysis of adhesions;  Surgeon: [place]is Riley, MD;  [facility];  Service: General;  Laterality: N/A;  . LUMBAR LAMINECTOMY/DECOMPRESSION MICRODISCECTOMY Right 10/11/2018   Procedure: LAMINECTOMY AND FORAMINOTOMY RIGHT LUMBAR FOUR- LUMBAR FIVE;  Surgeon: Consuella Lose, MD;  Location: [facility];  Service: Neurosurgery;  Laterality: Right;  . SHOULDER ARTHROSCOPY  7/12    left-dsc  . TONSILLECTOMY  at age 11  . TRIGGER FINGER RELEASE  12/20/2012   Procedure: RELEASE TRIGGER FINGER/A-1 PULLEY;  Surgeon: [place] Must, MD;  Location: [facility];  Service: Orthopedics;  Laterality: Left;  LEFT TRIGGER THUMB RELEASE    There were no vitals filed for this visit.  Subjective Assessment - 11/18/19 0948    Subjective  COVID-19 screen performed prior to patient entering clinic.  Will be in brace next week.    Pertinent History  Lumbar surgery x 2, DM, left shoulder surgery.    Patient Stated Goals  Get out of boot and walk without pain.    Currently in Pain?  Yes    Pain Score  4                        [facility] Adult PT Treatment/Exercise - 11/18/19 0001      Modalities   Modalities  Electrical Stimulation;Iontophoresis;Moist Heat      Moist Heat Therapy   Number Minutes Moist Heat  20 Minutes    Moist Heat Location  --   Right ankle.     Acupuncturist Location  Right lateral ankle.    Electrical Stimulation Action  Pre-mod.    Electrical Stimulation Parameters  80-150 Hz x 20 minutes.    Electrical Stimulation Goals  Pain      Iontophoresis   Type of Iontophoresis  Dexamethasone    Location  Right ATFL.    Dose  80 mA-Min.    Time  8      Manual Therapy   Manual Therapy  Passive ROM    Passive ROM  Gentle right ankle range of motion with focus on plantar/dorsiflexion x 8 minutes.                  PT Long Term Goals - 11/14/19 1356      PT LONG TERM GOAL #1   Title  Independent with a HEP.    Time  6    Period  Weeks    Status  New      PT LONG TERM GOAL #2   Title  Increase right ankle dorsiflexion to 8-10 degrees to normalize the patient's gait pattern.    Time  6    Period  Weeks    Status  New      PT LONG TERM GOAL #3   Title  Increase ankle strength to 4+ to 5/5 to increase stability for functional tasks.    Time  6    Period  Weeks    Status  New      PT  LONG TERM GOAL #4   Title  Walk in  clinic without brace and no deviations.    Time  6    Period  Weeks    Status  New            Plan - 11/18/19 1004    Clinical Impression Statement  Patient tender to palption over right ATFL.  She did well with treatment today without complaint.    Personal Factors and Comorbidities  Comorbidity 1    Comorbidities  Lumbar surgery x 2, DM, left shoulder surgery.    Examination-Activity Limitations  Locomotion Level;Other    Stability/Clinical Decision Making  Stable/Uncomplicated    Rehab Potential  Excellent    PT Frequency  2x / week    PT Duration  6 weeks    PT Treatment/Interventions  ADLs/Self Care Home Management;Cryotherapy;Electrical Stimulation;Gait training;Ultrasound;Moist Heat;Iontophoresis 4mg /ml Dexamethasone;Therapeutic exercise;Neuromuscular re-education;Therapeutic activities;Patient/family education;Manual techniques;Passive range of motion;Vasopneumatic Device    PT Next Visit Plan  Seated BAPS and Rockerboard, gentle range of motion, isometrics, yellow theraband 4 way ankle exercise.  Modalities as needed.    Consulted and Agree with Plan of Care  Patient       Patient will benefit from skilled therapeutic intervention in order to improve the following deficits and impairments:  Pain, Decreased activity tolerance, Decreased range of motion  Visit Diagnosis: Pain in right ankle and joints of right foot  Stiffness of right ankle, not elsewhere classified     Problem List Patient Active Problem List   Diagnosis Date Noted  . AMS (altered mental status) 05/19/2019  . Thrombocytopenia ([place]) 05/19/2019  . SBO (small bowel obstruction) s/p lap LOA 03/19/2019 03/19/2019  . Chronic migraine without aura, with intractable migraine, so stated, with status migrainosus 11/20/2018  . Lumbar radiculopathy 10/11/2018  . Atelectasis   . Hypoglycemia   . Hypotension   . Bradycardia   . Epigastric pain 06/19/2018  . History of  adenomatous polyp of colon 10/23/2017  . Hyperlipidemia associated with type 2 diabetes mellitus ([place]) 07/06/2017  . GERD (gastroesophageal reflux disease) 03/21/2017  . History of Roux-en-Y gastric bypass 2018 03/21/2017  . Morbid obesity with BMI of 40.0-44.9, adult ([place]) 06/06/2016  . Intractable chronic migraine without aura 06/04/2015  . Abnormal uterine bleeding 05/13/2015  . Pancreatitis 02/27/2014  . Fatty liver disease, nonalcoholic [id number]  . Bipolar disorder ([place]) 02/20/2014  . Metabolic syndrome [id number]  . Vitamin D deficiency   . DM (diabetes mellitus) ([place]) 03/12/2013  . Surgery, elective 06/12/2011  . Hypothyroidism   . Constipation, chronic   . Vitamin B 12 deficiency   . ALLERGIC RHINITIS 12/31/2010  . BARRETTS ESOPHAGUS 12/31/2010  . Gastroparesis 12/31/2010  . Bilateral chronic knee pain 12/31/2010  . Obstructive sleep apnea 09/27/2010  . Migraine 09/27/2010  . RESPIRATORY FAILURE, CHRONIC 09/27/2010  . Cyst of ovary 10/19/2009    Deadrick Stidd, [place] MPT 11/18/2019, 10:08 AM  [facility] [address] [place], [place], [address] Phone: [phone]   Fax:  [phone]  Name: Morgan [name] MRN: [id number] Date of Birth: 1966/02/16

## 2019-11-19 ENCOUNTER — Encounter: Payer: Self-pay | Admitting: Family Medicine

## 2019-11-19 ENCOUNTER — Ambulatory Visit (INDEPENDENT_AMBULATORY_CARE_PROVIDER_SITE_OTHER): Payer: Medicare Other | Admitting: Family Medicine

## 2019-11-19 VITALS — BP 117/81 | HR 71 | Temp 98.6°F

## 2019-11-19 DIAGNOSIS — E538 Deficiency of other specified B group vitamins: Secondary | ICD-10-CM

## 2019-11-19 DIAGNOSIS — E559 Vitamin D deficiency, unspecified: Secondary | ICD-10-CM

## 2019-11-19 DIAGNOSIS — E119 Type 2 diabetes mellitus without complications: Secondary | ICD-10-CM

## 2019-11-19 LAB — BAYER DCA HB A1C WAIVED: HB A1C (BAYER DCA - WAIVED): 5.4 % (ref <7.0)

## 2019-11-19 MED ORDER — NYSTATIN 100000 UNIT/GM EX POWD: CUTANEOUS | 1 refills | Status: DC

## 2019-11-19 NOTE — Progress Notes (Signed)
Subjective: CC: DM, HTN and preop exam PCP: Janora Norlander, DO [id number] R Aramburu is a 53 y.o. female presenting to clinic today for:  1. Diet controlled type 2 Diabetes w/ hx HTN and HLD  Patient reports blood sugars have been doing better with the scheduled meals.  She has had a 3 lows into the 40s which she attributes to eating cereal.  Most blood sugars have remained in the 90s to low 100s otherwise.    Last eye exam: Had eye exam done in June Last foot exam: 08/2019 Last A1c:  Lab Results  Component Value Date   HGBA1C 5.4 08/16/2019   Nephropathy screen indicated?:  Up-to-date Last flu, zoster and/or pneumovax: Up-to-date Immunization History  Administered Date(s) Administered  . Influenza,inj,Quad PF,6+ Mos 09/29/2016, 09/07/2017, 09/11/2018  . Influenza,inj,quad, With Preservative 09/17/2019  . Pneumococcal Polysaccharide-23 06/29/2016  . Td 07/09/2015  . Tdap 07/09/2015  . Zoster Recombinat (Shingrix) 10/06/2017, 01/04/2018    ROS: No chest pain, shortness of breath, dizziness, falls.  2.  History of gastric bypass with subsequent vitamin D and vitamin B12 deficiency She is compliant with an over-the-counter vitamin D calcium supplementation, which she is continued since surgery on her right lower extremity.  She has not had a vitamin B12 injection since 3 months ago.  Denies any peripheral neuropathy, balance problems, issues with proprioception.  ROS: Per HPI  Allergies  Allergen Reactions  . Ketoconazole Hives and Swelling    SWELLING REACTION UNSPECIFIED   . Pravachol [Pravastatin Sodium] Shortness Of Breath, Swelling and Anaphylaxis    Throat swelling  . Statins Other (See Comments)    Leg cramps  . Tape Dermatitis and Other (See Comments)    Steri-Strips  . Codeine Hypertension    increased BP   Past Medical History:  Diagnosis Date  . Allergic rhinitis   . Anemia     after gastric bypass in 2018  . Anxiety   . Barrett's esophagus   .  Bipolar affective disorder ([place])   . CAP (community acquired pneumonia) 07/20/2018  . Chronic respiratory failure ([place])   . Complication of anesthesia    "they had a hard time keeping my blood sugar up after back surgery in April 2020" per pt  . Constipation, chronic   . Degenerative joint disease of spine   . Depression   . Diabetes mellitus without complication ([place])    type 2   . DM (diabetes mellitus) ([place])    "hypoglycemic" per pt - no longer takes DM meds due to weight loss from gastric bypass in 2018  . Dry eye   . Family history of adverse reaction to anesthesia    nausea and vomiting  . Gastroparesis   . GERD (gastroesophageal reflux disease)   . History of bariatric surgery 03/2017  . History of hiatal hernia   . HLD (hyperlipidemia) 03/12/2013  . Hyperlipidemia   . Hypertension    No HTN meds since weight loss from Gastric Bypass in 2018  . Intractable chronic migraine without aura 06/04/2015  . Migraine headache   . Morbid obesity ([place])   . OSA (obstructive sleep apnea)    No cpap since gastric surgery  . Osteoarthritis    bilateral knee  . SBO (small bowel obstruction) ([place]) 03/19/2019  . Sleep apnea   . Unspecified hypothyroidism   . Vitamin B 12 deficiency   . Vitamin D deficiency     Current Outpatient Medications:  .  acetaminophen (TYLENOL) 500 MG  tablet, Take 1,000 mg by mouth every 4 (four) hours as needed for moderate pain., Disp: , Rfl:  .  AJOVY 225 MG/1.5ML SOSY, Inject 140 mg into the skin every 30 (thirty) days., Disp: 1.5 mL, Rfl: 0 .  azelastine (ASTELIN) 0.1 % nasal spray, Place 1 spray into both nostrils 2 (two) times daily., Disp: 30 mL, Rfl: 12 .  Calcium-Vitamin D-Vitamin K 650-12.5-40 MG-MCG-MCG CHEW, Chew 1 tablet by mouth 2 (two) times daily., Disp: , Rfl:  .  cyclobenzaprine (FLEXERIL) 10 MG tablet, , Disp: , Rfl:  .  dicyclomine (BENTYL) 20 MG tablet, Take 20 mg by mouth every 8 (eight) hours as needed for spasms. For back Spasm, Disp: , Rfl:   .  divalproex (DEPAKOTE) 500 MG DR tablet, TAKE 1 TABLET EVERY MORNING AND 2 TABLETS AT BEDTIME, Disp: 90 tablet, Rfl: 5 .  docusate sodium (COLACE) 100 MG capsule, Take 1 capsule (100 mg total) by mouth 2 (two) times daily., Disp: 30 capsule, Rfl: 0 .  fluticasone (FLONASE) 50 MCG/ACT nasal spray, Place 2 sprays into both nostrils 2 (two) times daily as needed for allergies., Disp: 16 g, Rfl: 4 .  gabapentin (NEURONTIN) 300 MG capsule, TAKE 2 CAPSULES IN THE MORNING, 1 CAPSULE AT LUNCH AND 2 CAPSULES AT BEDTIME, Disp: 450 capsule, Rfl: 0 .  Glucagon, rDNA, (GLUCAGON EMERGENCY IJ), Inject 1 Syringe as directed as needed (emergency low blood sugar)., Disp: , Rfl:  .  glucose blood (ONE TOUCH ULTRA TEST) test strip, CHECK BLOOD SUGAR FOUR TIMES A DAY.  Dx E16.2 E11.649, Disp: 400 each, Rfl: 3 .  levothyroxine (SYNTHROID) 112 MCG tablet, TAKE 1 TABLET BEFORE BREAKFAST, Disp: 90 tablet, Rfl: 1 .  LINZESS 145 MCG CAPS capsule, , Disp: , Rfl:  .  loratadine (CLARITIN) 10 MG tablet, Take 10 mg by mouth daily as needed for allergies. , Disp: , Rfl:  .  Multiple Vitamin (MULTIVITAMIN) capsule, Take 1 capsule by mouth daily. , Disp: , Rfl:  .  nystatin (MYCOSTATIN/NYSTOP) powder, APPLY TOPICALLY TWICE A DAY AS NEEDED, Disp: 30 g, Rfl: 0 .  ondansetron (ZOFRAN) 4 MG tablet, Take 1 tablet (4 mg total) by mouth every 8 (eight) hours., Disp: 90 tablet, Rfl: 11 .  pantoprazole (PROTONIX) 40 MG tablet, Take 1 tablet (40 mg total) by mouth daily., Disp: 90 tablet, Rfl: 0 .  PAZEO 0.7 % SOLN, Place 1 drop into both eyes every morning., Disp: , Rfl:  .  polyethylene glycol (MIRALAX / GLYCOLAX) packet, Take 17 g by mouth at bedtime. , Disp: , Rfl:  .  Probiotic Product (PROBIOTIC DAILY PO), Take 1 capsule by mouth daily., Disp: , Rfl:  .  RESTASIS MULTIDOSE 0.05 % ophthalmic emulsion, Place 1 drop into both eyes 2 (two) times daily., Disp: , Rfl:  .  sertraline (ZOLOFT) 100 MG tablet, Take 2 tablets (200 mg total) by  mouth daily., Disp: 180 tablet, Rfl: 3 .  tiZANidine (ZANAFLEX) 2 MG tablet, Take 1 tablet (2 mg total) by mouth every 8 (eight) hours as needed for muscle spasms., Disp: 60 tablet, Rfl: 0 .  ziprasidone (GEODON) 60 MG capsule, Take 1 capsule (60 mg total) by mouth at bedtime., Disp: 90 capsule, Rfl: 1  Current Facility-Administered Medications:  .  cyanocobalamin ((VITAMIN B-12)) injection 1,000 mcg, 1,000 mcg, Intramuscular, Q30 days, ,  M, DO, 1,000 mcg at 08/16/19 1330   Social History   Socioeconomic History  . Marital status: Single    Spouse name: Not  on file  . Number of children: 0  . Years of education: HS  . Highest education level: 12th grade  Occupational History  . Occupation: disabled    Fish farm manager: UNEMPLOYED  Social Needs  . Financial resource strain: Not hard at all  . Food insecurity    Worry: Never true    Inability: Never true  . Transportation needs    Medical: No    Non-medical: No  Tobacco Use  . Smoking status: Never Smoker  . Smokeless tobacco: Never Used  Substance and Sexual Activity  . Alcohol use: No  . Drug use: No  . Sexual activity: Never    Birth control/protection: Post-menopausal    Comment: LMP 10/2017  Lifestyle  . Physical activity    Days per week: 0 days    Minutes per session: 0 min  . Stress: Not at all  Relationships  . Social connections    Talks on phone: More than three times a week    Gets together: More than three times a week    Attends religious service: More than 4 times per year    Active member of club or organization: No    Attends meetings of clubs or organizations: Never    Relationship status: Never married  . Intimate partner violence    Fear of current or ex partner: No    Emotionally abused: No    Physically abused: No    Forced sexual activity: No  Other Topics Concern  . Not on file  Social History Narrative   Patient is right handed.   Patient drinks 2 glasses of caffeine daily.    Lives at home. Her niece is living with her right now.   Family History  Problem Relation Age of Onset  . Asthma Father   . Allergies Father   . Heart disease Father        enlarged heart  . Peripheral vascular disease Father   . Diabetes Father   . Hyperlipidemia Father   . Arthritis Father   . Asthma Sister   . Cancer Sister        colon at 23 yr old.  . Colon cancer Sister   . Allergies Mother   . Stroke Mother 58       with hemi paralysis  . Diabetes Mother   . Hyperlipidemia Mother   . Hypertension Mother   . GI problems Mother   . Arthritis Mother   . Allergies Brother   . Early death Brother 9       congenital abormality  . Allergies Sister   . Diabetes Sister   . Asthma Sister   . Colon polyps Sister   . Hyperlipidemia Sister   . GI problems Sister        gastroporesis   . Liver disease Sister        fatty liver  . Stroke Sister        intercrandial bleed  . Diabetes Brother   . Hypertension Brother   . Hyperlipidemia Brother   . Heart disease Paternal Aunt   . Migraines Neg Hx     Objective: Office vital signs reviewed. BP 117/81   Pulse 71   Temp 98.6 F (37 C) (Temporal)   LMP 10/17/2017 (Approximate)   SpO2 96%   Physical Examination:  General: Awake, alert, thin, healthier appearing. No acute distress HEENT: Normal, sclera white.  MMM Cardio: regular rate and rhythm, S1S2 heard, no murmurs appreciated Pulm: clear to  auscultation bilaterally, no wheezes, rhonchi or rales; normal work of breathing on room air Extremities: warm, well perfused, No edema, cyanosis or clubbing; +2 pulses bilaterally  Assessment/ Plan: 53 y.o. female   1. Diet-controlled diabetes mellitus ([place]) Remains controlled.  She is had a couple of lows.  We discussed increasing protein if she finds a need to continue cereal but otherwise would avoid it altogether since it seems to precipitate her low blood sugars.  Check lipid panel.  Has been off of all cholesterol  medicine.   - hgba1c - Lipid Panel  2. Vitamin D deficiency Continue vitamin D supplementation.  Check vitamin D level - Vitamin D 25 hydroxy  3. Vitamin B 12 deficiency Asymptomatic.  Vitamin B administered during today's visit - Vitamin B12   Orders Placed This Encounter  Procedures  . hgba1c  . Lipid Panel  . Vitamin D 25 hydroxy  . Vitamin B12    No orders of the defined types were placed in this encounter.   Janora Norlander, DO [place] [phone]

## 2019-11-19 NOTE — Patient Instructions (Signed)

## 2019-11-20 LAB — LIPID PANEL
Chol/HDL Ratio: 3.3 ratio (ref 0.0–4.4)
Cholesterol, Total: 193 mg/dL (ref 100–199)
HDL: 59 mg/dL (ref >39)
LDL Chol Calc (NIH): 115 mg/dL — ABNORMAL HIGH (ref 0–99)
Triglycerides: 107 mg/dL (ref 0–149)
VLDL Cholesterol Cal: 19 mg/dL (ref 5–40)

## 2019-11-20 LAB — VITAMIN D 25 HYDROXY (VIT D DEFICIENCY, FRACTURES): Vit D, 25-Hydroxy: 58.5 ng/mL (ref 30.0–100.0)

## 2019-11-20 LAB — VITAMIN B12: Vitamin B-12: 1152 pg/mL (ref 232–1245)

## 2019-11-28 ENCOUNTER — Other Ambulatory Visit: Payer: Self-pay

## 2019-11-28 ENCOUNTER — Ambulatory Visit: Payer: Medicare Other | Admitting: *Deleted

## 2019-11-28 ENCOUNTER — Telehealth: Payer: Self-pay

## 2019-11-28 DIAGNOSIS — M25671 Stiffness of right ankle, not elsewhere classified: Secondary | ICD-10-CM

## 2019-11-28 DIAGNOSIS — M25571 Pain in right ankle and joints of right foot: Secondary | ICD-10-CM

## 2019-11-28 NOTE — Telephone Encounter (Signed)
Ajovy denied by pts insurance. It states Ajovy is not on pts formulary drug list. The patient must try  Emgality or the doctor needs to give [place] specific medical reasons why the covered drug is not appropriate for pt. Contact number for optum rx is [phone].

## 2019-11-28 NOTE — Therapy (Signed)
[facility] [place], [place], [address] Phone: [phone]   Fax:  [phone]  Physical Therapy Treatment  Patient Details  Name: Morgan [name] MRN: [id number] Date of Birth: May 30, 1966 Referring Provider (PT): Radene Journey MD   Encounter Date: 11/28/2019  PT End of Session - 11/28/19 0910    Visit Number  3    Number of Visits  12    Date for PT Re-Evaluation  12/26/19    Authorization Type  FOTO AT LEAST EVERY 5TH VISIT.  PROGRESS NOTE AT 10TH VISIT.    PT Start Time  0900    PT Stop Time  0950    PT Time Calculation (min)  50 min       Past Medical History:  Diagnosis Date  . Allergic rhinitis   . Anemia     after gastric bypass in 2018  . Anxiety   . Barrett's esophagus   . Bipolar affective disorder ([place])   . CAP (community acquired pneumonia) 07/20/2018  . Chronic respiratory failure ([place])   . Complication of anesthesia    "they had a hard time keeping my blood sugar up after back surgery in April 2020" per pt  . Constipation, chronic   . Degenerative joint disease of spine   . Depression   . Diabetes mellitus without complication ([place])    type 2   . DM (diabetes mellitus) ([place])    "hypoglycemic" per pt - no longer takes DM meds due to weight loss from gastric bypass in 2018  . Dry eye   . Family history of adverse reaction to anesthesia    nausea and vomiting  . Gastroparesis   . GERD (gastroesophageal reflux disease)   . History of bariatric surgery 03/2017  . History of hiatal hernia   . HLD (hyperlipidemia) 03/12/2013  . Hyperlipidemia   . Hypertension    No HTN meds since weight loss from Gastric Bypass in 2018  . Intractable chronic migraine without aura 06/04/2015  . Migraine headache   . Morbid obesity ([place])   . OSA (obstructive sleep apnea)    No cpap since gastric surgery  . Osteoarthritis    bilateral knee  . SBO (small bowel obstruction) ([place]) 03/19/2019  . Sleep apnea   .  Unspecified hypothyroidism   . Vitamin B 12 deficiency   . Vitamin D deficiency     Past Surgical History:  Procedure Laterality Date  . ANKLE ARTHROSCOPY WITH RECONSTRUCTION Right 09/24/2019   Procedure: ANKLE ARTHROSCOPY DEBRIDEMENT TREATMENT OF OSTEOCHONDRAL LESION TALUS. PRONEAL TENDON DEBRIDEMENT;  Surgeon: Erle Crocker, MD;  Location: [facility];  Service: Orthopedics;  Laterality: Right;  SURGERY REQUEST TIME 2 HOURS  . APPENDECTOMY  1978  . CHOLECYSTECTOMY  2005  . COLONOSCOPY    . disc repair  05/17/2019   with rods, lumbar spine  . GASTRIC ROUX-EN-Y N/A 03/21/2017   Procedure: LAPAROSCOPIC ROUX-EN-Y GASTRIC, UPPER ENDO;  Surgeon: Greer Pickerel, MD;  Location: [facility];  Service: General;  Laterality: N/A;  . GASTROSTOMY N/A 06/19/2018   Procedure: LAPRASCOPIC INSERTION OF GASTROSTOMY TUBE;  Surgeon: Greer Pickerel, MD;  Location: [facility];  Service: General;  Laterality: N/A;  . GASTROSTOMY TUBE PLACEMENT Left    06/2018  . HERNIA REPAIR    . HIATAL HERNIA REPAIR N/A 06/19/2018   Procedure: LAPAROSCOPIC REPAIR OF HIATAL HERNIA;  Surgeon: Greer Pickerel, MD;  Location: [facility];  Service: General;  Laterality: N/A;  . LAPAROSCOPIC LYSIS  OF ADHESIONS  03/19/2019   Dr. Romana Juniper  . LAPAROSCOPY N/A 06/19/2018   Procedure: LAPAROSCOPY DIAGNOSTIC;  Surgeon: Greer Pickerel, MD;  Location: [facility];  Service: General;  Laterality: N/A;  . LAPAROSCOPY N/A 03/19/2019   Procedure: LAPAROSCOPY DIAGNOSTIC lysis of adhesions;  Surgeon: Clovis Riley, MD;  Location: [facility];  Service: General;  Laterality: N/A;  . LUMBAR LAMINECTOMY/DECOMPRESSION MICRODISCECTOMY Right 10/11/2018   Procedure: LAMINECTOMY AND FORAMINOTOMY RIGHT LUMBAR FOUR- LUMBAR FIVE;  Surgeon: Consuella Lose, MD;  Location: [facility];  Service: Neurosurgery;  Laterality: Right;  . SHOULDER ARTHROSCOPY  7/12   left-dsc  . TONSILLECTOMY  at age 64  . TRIGGER FINGER RELEASE  12/20/2012   Procedure: RELEASE TRIGGER  FINGER/A-1 PULLEY;  Surgeon: Tennis Must, MD;  Location: [facility];  Service: Orthopedics;  Laterality: Left;  LEFT TRIGGER THUMB RELEASE    There were no vitals filed for this visit.  Subjective Assessment - 11/28/19 0905    Subjective  COVID-19 screen performed prior to patient entering clinic.  Wearing ASO brace now    Pertinent History  Lumbar surgery x 2, DM, left shoulder surgery.    Patient Stated Goals  Get out of boot and walk without pain.    Currently in Pain?  Yes    Pain Score  6     Pain Location  Ankle    Pain Orientation  Right    Pain Descriptors / Indicators  Aching;Sore    Pain Type  Surgical pain    Pain Onset  More than a month ago                       [facility] Adult PT Treatment/Exercise - 11/28/19 0001      Exercises   Exercises  Ankle      Modalities   Modalities  Electrical Stimulation;Iontophoresis;Moist Heat      Electrical Stimulation   Electrical Stimulation Location  Right lateral ankle.    Child psychotherapist Parameters  80-150hz     Printmaker Goals  Edema;Pain      Iontophoresis   Type of Iontophoresis  Dexamethasone    Location  Right ATFL.    Dose  80 Ma-mins    Time  8      Vasopneumatic   Number Minutes Vasopneumatic   15 minutes    Vasopnuematic Location   --   Right ankle.   Vasopneumatic Pressure  Low    Vasopneumatic Temperature   40      Ankle Exercises: Seated   Other Seated Ankle Exercises  seated rocker board df/pf x 3 mins, eversion/inversion x 3 mins    Other Seated Ankle Exercises  seated dynadisc CW/CCW x5 mins total                  PT Long Term Goals - 11/14/19 1356      PT LONG TERM GOAL #1   Title  Independent with a HEP.    Time  6    Period  Weeks    Status  New      PT LONG TERM GOAL #2   Title  Increase right ankle dorsiflexion to 8-10 degrees to normalize the patient's gait pattern.    Time  6     Period  Weeks    Status  New      PT LONG TERM GOAL #3   Title  Increase  ankle strength to 4+ to 5/5 to increase stability for functional tasks.    Time  6    Period  Weeks    Status  New      PT LONG TERM GOAL #4   Title  Walk in clinic without brace and no deviations.    Time  6    Period  Weeks    Status  New            Plan - 11/28/19 [id number]    Clinical Impression Statement  Pt arrived today wearing ASO on RT foot and reporting 6/10 soreness today. She was guided through seated ankle exs using rocker board and dyna-disc with cues for technique (slow and controlled). Good response to premod and Vaso (40 degrees).    Personal Factors and Comorbidities  Comorbidity 1    Examination-Activity Limitations  Locomotion Level;Other    Stability/Clinical Decision Making  Stable/Uncomplicated    Rehab Potential  Excellent    PT Frequency  2x / week    PT Duration  6 weeks    PT Treatment/Interventions  ADLs/Self Care Home Management;Cryotherapy;Electrical Stimulation;Gait training;Ultrasound;Moist Heat;Iontophoresis 4mg /ml Dexamethasone;Therapeutic exercise;Neuromuscular re-education;Therapeutic activities;Patient/family education;Manual techniques;Passive range of motion;Vasopneumatic Device    PT Next Visit Plan  Seated BAPS and Rockerboard, gentle range of motion, isometrics, yellow theraband 4 way ankle exercise.  Modalities as needed.    Consulted and Agree with Plan of Care  Patient       Patient will benefit from skilled therapeutic intervention in order to improve the following deficits and impairments:  Pain, Decreased activity tolerance, Decreased range of motion  Visit Diagnosis: Pain in right ankle and joints of right foot  Stiffness of right ankle, not elsewhere classified     Problem List Patient Active Problem List   Diagnosis Date Noted  . AMS (altered mental status) 05/19/2019  . Thrombocytopenia ([place]) 05/19/2019  . SBO (small bowel obstruction) s/p lap LOA  03/19/2019 03/19/2019  . Chronic migraine without aura, with intractable migraine, so stated, with status migrainosus 11/20/2018  . Lumbar radiculopathy 10/11/2018  . Atelectasis   . Hypoglycemia   . Hypotension   . Bradycardia   . Epigastric pain 06/19/2018  . History of adenomatous polyp of colon 10/23/2017  . Hyperlipidemia associated with type 2 diabetes mellitus ([place]) 07/06/2017  . GERD (gastroesophageal reflux disease) 03/21/2017  . History of Roux-en-Y gastric bypass 2018 03/21/2017  . Morbid obesity with BMI of 40.0-44.9, adult ([place]) 06/06/2016  . Intractable chronic migraine without aura 06/04/2015  . Abnormal uterine bleeding 05/13/2015  . Pancreatitis 02/27/2014  . Fatty liver disease, nonalcoholic [id number]  . Bipolar disorder ([place]) 02/20/2014  . Metabolic syndrome [id number]  . Vitamin D deficiency   . DM (diabetes mellitus) ([place]) 03/12/2013  . Surgery, elective 06/12/2011  . Hypothyroidism   . Constipation, chronic   . Vitamin B 12 deficiency   . ALLERGIC RHINITIS 12/31/2010  . BARRETTS ESOPHAGUS 12/31/2010  . Gastroparesis 12/31/2010  . Bilateral chronic knee pain 12/31/2010  . Obstructive sleep apnea 09/27/2010  . Migraine 09/27/2010  . RESPIRATORY FAILURE, CHRONIC 09/27/2010  . Cyst of ovary 10/19/2009    Faiza Bansal,CHRIS, PTA 11/28/2019, 9:59 AM  [facility] [place], [place], [address] Phone: [phone]   Fax:  [phone]  Name: Morgan [name] MRN: [id number] Date of Birth: 1966/02/24

## 2019-11-28 NOTE — Telephone Encounter (Signed)
PA for Ajovy done on cover my meds. [employer] is reviewing your PA request. Typically an electronic response will be received within 72 hours. To check for an update later, open this request from your dashboard. You may close this dialog and return to your dashboard to perform other tasks

## 2019-12-02 ENCOUNTER — Other Ambulatory Visit: Payer: Self-pay | Admitting: Neurology

## 2019-12-16 ENCOUNTER — Other Ambulatory Visit: Payer: Self-pay | Admitting: Neurology

## 2019-12-17 ENCOUNTER — Other Ambulatory Visit: Payer: Self-pay | Admitting: Neurology

## 2019-12-17 ENCOUNTER — Telehealth: Payer: Self-pay | Admitting: Neurology

## 2019-12-17 MED ORDER — TIZANIDINE HCL 2 MG PO TABS: 2.0000 mg | ORAL_TABLET | Freq: Three times a day (TID) | ORAL | 6 refills | Status: DC | PRN

## 2019-12-17 NOTE — Telephone Encounter (Signed)
Jaime from [facility] called stating that the pt is needing a refill on her tiZANidine (ZANAFLEX) 2 MG tablet and that it came under Dr. Jannifer Franklin as the provider but they received a denial stating the provider was not under [place]. Please advise.

## 2019-12-19 ENCOUNTER — Encounter: Payer: Self-pay | Admitting: *Deleted

## 2019-12-19 ENCOUNTER — Telehealth: Payer: Self-pay | Admitting: Neurology

## 2019-12-19 NOTE — Telephone Encounter (Signed)
Information mailed to the pt and mychart message sent to pt.

## 2019-12-19 NOTE — Telephone Encounter (Signed)
Patient is requesting [employer] card to be mailed out to her.   Marval Regal, RN to [place], [place]     11/28/19 4:33 PM Your insurance denied your Ajovy medication. Please use your co pay card for Ajovy because you have commercial insurance. If you dont have one please go online and activate it or we can send one in the mail for you. The co pay card allows you to get the medication at the pharmacy.If you have any questions give [place] a call at [phone].

## 2019-12-19 NOTE — Telephone Encounter (Signed)
Patient has medicare. Unfortunately she is not eligible for the savings card offer. I will mail her the information about possible Ajovy assistance through [employer].

## 2019-12-23 ENCOUNTER — Encounter: Payer: Self-pay | Admitting: *Deleted

## 2019-12-23 ENCOUNTER — Telehealth: Payer: Self-pay | Admitting: Neurology

## 2019-12-23 ENCOUNTER — Other Ambulatory Visit: Payer: Self-pay | Admitting: *Deleted

## 2019-12-23 ENCOUNTER — Ambulatory Visit: Payer: Medicare Other

## 2019-12-23 MED ORDER — TIZANIDINE HCL 2 MG PO TABS: 2.0000 mg | ORAL_TABLET | Freq: Three times a day (TID) | ORAL | 6 refills | Status: DC | PRN

## 2019-12-23 NOTE — Telephone Encounter (Signed)
Messaged pt to see if Morgan [name] is working for her so far.

## 2019-12-23 NOTE — Telephone Encounter (Signed)
Pt called stating that the Fremanezumab-vfrm (AJOVY) 225 MG/1.5ML SOSY will have to be changed because she can not afford the medication. She would like something that is covered by the insurance. Please advise.

## 2019-12-23 NOTE — Telephone Encounter (Signed)
Message from patient in mychart in regards to the Ajovy: "i thank about 6 months . It was working until this month i don't no why. But i took the shot on 12/15/19 and i have had a headache about every day ."

## 2019-12-23 NOTE — Telephone Encounter (Signed)
We have not seen her in over 6 months, we really have to see her if something has changed. She may have gotten a bad ajovy, offer her a sample that she can inject and also have her follow up with Amy.

## 2019-12-24 NOTE — Telephone Encounter (Signed)
Noted. I sent pt a mychart message.  

## 2019-12-25 ENCOUNTER — Other Ambulatory Visit: Payer: Self-pay

## 2019-12-25 ENCOUNTER — Ambulatory Visit (INDEPENDENT_AMBULATORY_CARE_PROVIDER_SITE_OTHER): Payer: Self-pay | Admitting: *Deleted

## 2019-12-25 DIAGNOSIS — G43719 Chronic migraine without aura, intractable, without status migrainosus: Secondary | ICD-10-CM

## 2019-12-25 DIAGNOSIS — Z0289 Encounter for other administrative examinations: Secondary | ICD-10-CM

## 2019-12-25 MED ORDER — AJOVY 225 MG/1.5ML ~~LOC~~ SOAJ: 225.0000 mg | Freq: Once | SUBCUTANEOUS | 0 refills | Status: AC

## 2019-12-25 NOTE — Progress Notes (Signed)
Patient came by the office and picked up 1 Ajovy 225 mg sample to go ahead and inject once now per Dr. Jaynee Eagles. Mychart appt with Amy NP on 01/14/2020 @ 8:30 AM. Pt aware video visit instructions will be available through her mychart. Pt verbalized appreciation.

## 2019-12-31 ENCOUNTER — Ambulatory Visit: Payer: Medicare Other | Attending: Orthopaedic Surgery | Admitting: *Deleted

## 2019-12-31 ENCOUNTER — Other Ambulatory Visit: Payer: Self-pay

## 2019-12-31 DIAGNOSIS — M25671 Stiffness of right ankle, not elsewhere classified: Secondary | ICD-10-CM | POA: Insufficient documentation

## 2019-12-31 DIAGNOSIS — M25571 Pain in right ankle and joints of right foot: Secondary | ICD-10-CM | POA: Diagnosis not present

## 2019-12-31 NOTE — Therapy (Signed)
[facility] [place], [place], [address] Phone: [phone]   Fax:  [phone]  Physical Therapy Treatment  Patient Details  Name: Morgan [name] MRN: [id number] Date of Birth: 11/14/1966 Referring Provider (PT): Radene Journey MD   Encounter Date: 12/31/2019  PT End of Session - 12/31/19 0913    Visit Number  4    Number of Visits  12    Date for PT Re-Evaluation  12/26/19    Authorization Type  FOTO AT LEAST EVERY 5TH VISIT.  PROGRESS NOTE AT 10TH VISIT.    PT Start Time  0900    PT Stop Time  0950    PT Time Calculation (min)  50 min       Past Medical History:  Diagnosis Date  . Allergic rhinitis   . Anemia     after gastric bypass in 2018  . Anxiety   . Barrett's esophagus   . Bipolar affective disorder ([place])   . CAP (community acquired pneumonia) 07/20/2018  . Chronic respiratory failure ([place])   . Complication of anesthesia    "they had a hard time keeping my blood sugar up after back surgery in April 2020" per pt  . Constipation, chronic   . Degenerative joint disease of spine   . Depression   . Diabetes mellitus without complication ([place])    type 2   . DM (diabetes mellitus) ([place])    "hypoglycemic" per pt - no longer takes DM meds due to weight loss from gastric bypass in 2018  . Dry eye   . Family history of adverse reaction to anesthesia    nausea and vomiting  . Gastroparesis   . GERD (gastroesophageal reflux disease)   . History of bariatric surgery 03/2017  . History of hiatal hernia   . HLD (hyperlipidemia) 03/12/2013  . Hyperlipidemia   . Hypertension    No HTN meds since weight loss from Gastric Bypass in 2018  . Intractable chronic migraine without aura 06/04/2015  . Migraine headache   . Morbid obesity ([place])   . OSA (obstructive sleep apnea)    No cpap since gastric surgery  . Osteoarthritis    bilateral knee  . SBO (small bowel obstruction) ([place]) 03/19/2019  . Sleep apnea   .  Unspecified hypothyroidism   . Vitamin B 12 deficiency   . Vitamin D deficiency     Past Surgical History:  Procedure Laterality Date  . ANKLE ARTHROSCOPY WITH RECONSTRUCTION Right 09/24/2019   Procedure: ANKLE ARTHROSCOPY DEBRIDEMENT TREATMENT OF OSTEOCHONDRAL LESION TALUS. PRONEAL TENDON DEBRIDEMENT;  Surgeon: Erle Crocker, MD;  Location: [facility];  Service: Orthopedics;  Laterality: Right;  SURGERY REQUEST TIME 2 HOURS  . APPENDECTOMY  1978  . CHOLECYSTECTOMY  2005  . COLONOSCOPY    . disc repair  05/17/2019   with rods, lumbar spine  . GASTRIC ROUX-EN-Y N/A 03/21/2017   Procedure: LAPAROSCOPIC ROUX-EN-Y GASTRIC, UPPER ENDO;  Surgeon: Greer Pickerel, MD;  [facility];  Service: General;  Laterality: N/A;  . GASTROSTOMY N/A 06/19/2018   Procedure: LAPRASCOPIC INSERTION OF GASTROSTOMY TUBE;  Surgeon: Greer Pickerel, MD;  [facility];  Service: General;  Laterality: N/A;  . GASTROSTOMY TUBE PLACEMENT Left    06/2018  . HERNIA REPAIR    . HIATAL HERNIA REPAIR N/A 06/19/2018   Procedure: LAPAROSCOPIC REPAIR OF HIATAL HERNIA;  Surgeon: Greer Pickerel, MD;  [facility];  Service: General;  Laterality: N/A;  . LAPAROSCOPIC LYSIS  OF ADHESIONS  03/19/2019   Dr. Romana Juniper  . LAPAROSCOPY N/A 06/19/2018   Procedure: LAPAROSCOPY DIAGNOSTIC;  Surgeon: Greer Pickerel, MD;  [facility];  Service: General;  Laterality: N/A;  . LAPAROSCOPY N/A 03/19/2019   Procedure: LAPAROSCOPY DIAGNOSTIC lysis of adhesions;  Surgeon: Clovis Riley, MD;  [facility];  Service: General;  Laterality: N/A;  . LUMBAR LAMINECTOMY/DECOMPRESSION MICRODISCECTOMY Right 10/11/2018   Procedure: LAMINECTOMY AND FORAMINOTOMY RIGHT LUMBAR FOUR- LUMBAR FIVE;  Surgeon: Consuella Lose, MD;  Location: [facility];  Service: Neurosurgery;  Laterality: Right;  . SHOULDER ARTHROSCOPY  7/12   left-dsc  . TONSILLECTOMY  at age 9  . TRIGGER FINGER RELEASE  12/20/2012   Procedure: RELEASE TRIGGER  FINGER/A-1 PULLEY;  Surgeon: Tennis Must, MD;  Location: [facility];  Service: Orthopedics;  Laterality: Left;  LEFT TRIGGER THUMB RELEASE    There were no vitals filed for this visit.  Subjective Assessment - 12/31/19 0911    Subjective  COVID-19 screen performed prior to patient entering clinic. MD wants me to strengthen my RT ankle    Pertinent History  Lumbar surgery x 2, DM, left shoulder surgery.    Currently in Pain?  Yes    Pain Score  5     Pain Location  Ankle    Pain Orientation  Right    Pain Descriptors / Indicators  Aching;Sore    Pain Onset  More than a month ago                       [facility] Adult PT Treatment/Exercise - 12/31/19 0001      Exercises   Exercises  Ankle      Modalities   Modalities  Electrical Stimulation;Iontophoresis;Moist Heat      Vasopneumatic   Number Minutes Vasopneumatic   15 minutes    Vasopnuematic Location   --   Right ankle.   Vasopneumatic Pressure  Low    Vasopneumatic Temperature   40      Manual Therapy   Manual Therapy  Passive ROM    Passive ROM  manual resisted Eversion and PROM for DF to 14 degrees today      Ankle Exercises: Aerobic   Nustep  Nustep x 5 mins stopped due to knee pain      Ankle Exercises: Standing   Rocker Board  3 minutes   PF/DF and calf stretching     Ankle Exercises: Seated   Other Seated Ankle Exercises  seated dynadisc CW/CCW x5 mins , yellow tband for  INV and EV 3 x fatigue and given for HEP                  PT Long Term Goals - 11/14/19 1356      PT LONG TERM GOAL #1   Title  Independent with a HEP.    Time  6    Period  Weeks    Status  New      PT LONG TERM GOAL #2   Title  Increase right ankle dorsiflexion to 8-10 degrees to normalize the patient's gait pattern.    Time  6    Period  Weeks    Status  New      PT LONG TERM GOAL #3   Title  Increase ankle strength to 4+ to 5/5 to increase stability for functional tasks.    Time  6     Period  Weeks  Status  New      PT LONG TERM GOAL #4   Title  Walk in clinic without brace and no deviations.    Time  6    Period  Weeks    Status  New            Plan - 12/31/19 0913    Clinical Impression Statement  Pt arrived today doing fair and reports that her MD wants her to cont with PT for RT ankle strengthening. She was guided through RT ankle proprioception and strengthening. PROM for DF 14 degrees today. Vaso tolerated well.    Personal Factors and Comorbidities  Comorbidity 1    Comorbidities  Lumbar surgery x 2, DM, left shoulder surgery.    Stability/Clinical Decision Making  Stable/Uncomplicated    Rehab Potential  Excellent    PT Frequency  2x / week    PT Duration  6 weeks    PT Treatment/Interventions  ADLs/Self Care Home Management;Cryotherapy;Electrical Stimulation;Gait training;Ultrasound;Moist Heat;Iontophoresis 4mg /ml Dexamethasone;Therapeutic exercise;Neuromuscular re-education;Therapeutic activities;Patient/family education;Manual techniques;Passive range of motion;Vasopneumatic Device    PT Next Visit Plan  RT ankle strengthening    Consulted and Agree with Plan of Care  Patient       Patient will benefit from skilled therapeutic intervention in order to improve the following deficits and impairments:  Pain, Decreased activity tolerance, Decreased range of motion  Visit Diagnosis: Stiffness of right ankle, not elsewhere classified  Pain in right ankle and joints of right foot     Problem List Patient Active Problem List   Diagnosis Date Noted  . AMS (altered mental status) 05/19/2019  . Thrombocytopenia ([place]) 05/19/2019  . SBO (small bowel obstruction) s/p lap LOA 03/19/2019 03/19/2019  . Chronic migraine without aura, with intractable migraine, so stated, with status migrainosus 11/20/2018  . Lumbar radiculopathy 10/11/2018  . Atelectasis   . Hypoglycemia   . Hypotension   . Bradycardia   . Epigastric pain 06/19/2018  . History of  adenomatous polyp of colon 10/23/2017  . Hyperlipidemia associated with type 2 diabetes mellitus ([place]) 07/06/2017  . GERD (gastroesophageal reflux disease) 03/21/2017  . History of Roux-en-Y gastric bypass 2018 03/21/2017  . Morbid obesity with BMI of 40.0-44.9, adult ([place]) 06/06/2016  . Intractable chronic migraine without aura 06/04/2015  . Abnormal uterine bleeding 05/13/2015  . Pancreatitis 02/27/2014  . Fatty liver disease, nonalcoholic [id number]  . Bipolar disorder ([place]) 02/20/2014  . Metabolic syndrome [id number]  . Vitamin D deficiency   . DM (diabetes mellitus) ([place]) 03/12/2013  . Surgery, elective 06/12/2011  . Hypothyroidism   . Constipation, chronic   . Vitamin B 12 deficiency   . ALLERGIC RHINITIS 12/31/2010  . BARRETTS ESOPHAGUS 12/31/2010  . Gastroparesis 12/31/2010  . Bilateral chronic knee pain 12/31/2010  . Obstructive sleep apnea 09/27/2010  . Migraine 09/27/2010  . RESPIRATORY FAILURE, CHRONIC 09/27/2010  . Cyst of ovary 10/19/2009    Zelpha Messing,CHRIS, PTA 12/31/2019, 11:31 AM  [facility] [place], [place], [address] Phone: [phone]   Fax:  [phone]  Name: Morgan [name] MRN: [id number] Date of Birth: [birth year]

## 2020-01-01 NOTE — Telephone Encounter (Signed)
Received PA request for Ajovy from [facility]. PA completed on cmm. Key: [id number] - PA Case ID: [id number]. Awaiting [employer] medicare part D determination.

## 2020-01-03 ENCOUNTER — Other Ambulatory Visit: Payer: Self-pay

## 2020-01-03 ENCOUNTER — Ambulatory Visit: Payer: Medicare Other | Admitting: *Deleted

## 2020-01-03 DIAGNOSIS — M25571 Pain in right ankle and joints of right foot: Secondary | ICD-10-CM | POA: Diagnosis not present

## 2020-01-03 DIAGNOSIS — M25671 Stiffness of right ankle, not elsewhere classified: Secondary | ICD-10-CM | POA: Diagnosis not present

## 2020-01-03 NOTE — Therapy (Signed)
[facility] [place], [place], [address] Phone: [phone]   Fax:  [phone]  Physical Therapy Treatment  Patient Details  Name: Morgan [name] MRN: [id number] Date of Birth: September 24, 1966 Referring Provider (PT): Radene Journey MD   Encounter Date: 01/03/2020  PT End of Session - 01/03/20 0957    Visit Number  5    Number of Visits  12    Date for PT Re-Evaluation  02/06/20    Authorization Type  FOTO AT LEAST EVERY 5TH VISIT.  PROGRESS NOTE AT 10TH VISIT.    PT Start Time  0945    PT Stop Time  1035    PT Time Calculation (min)  50 min       Past Medical History:  Diagnosis Date  . Allergic rhinitis   . Anemia     after gastric bypass in 2018  . Anxiety   . Barrett's esophagus   . Bipolar affective disorder ([place])   . CAP (community acquired pneumonia) 07/20/2018  . Chronic respiratory failure ([place])   . Complication of anesthesia    "they had a hard time keeping my blood sugar up after back surgery in April 2020" per pt  . Constipation, chronic   . Degenerative joint disease of spine   . Depression   . Diabetes mellitus without complication ([place])    type 2   . DM (diabetes mellitus) ([place])    "hypoglycemic" per pt - no longer takes DM meds due to weight loss from gastric bypass in 2018  . Dry eye   . Family history of adverse reaction to anesthesia    nausea and vomiting  . Gastroparesis   . GERD (gastroesophageal reflux disease)   . History of bariatric surgery 03/2017  . History of hiatal hernia   . HLD (hyperlipidemia) 03/12/2013  . Hyperlipidemia   . Hypertension    No HTN meds since weight loss from Gastric Bypass in 2018  . Intractable chronic migraine without aura 06/04/2015  . Migraine headache   . Morbid obesity ([place])   . OSA (obstructive sleep apnea)    No cpap since gastric surgery  . Osteoarthritis    bilateral knee  . SBO (small bowel obstruction) ([place]) 03/19/2019  . Sleep apnea   .  Unspecified hypothyroidism   . Vitamin B 12 deficiency   . Vitamin D deficiency     Past Surgical History:  Procedure Laterality Date  . ANKLE ARTHROSCOPY WITH RECONSTRUCTION Right 09/24/2019   Procedure: ANKLE ARTHROSCOPY DEBRIDEMENT TREATMENT OF OSTEOCHONDRAL LESION TALUS. PRONEAL TENDON DEBRIDEMENT;  Surgeon: Erle Crocker, MD;  Location: [facility];  Service: Orthopedics;  Laterality: Right;  SURGERY REQUEST TIME 2 HOURS  . APPENDECTOMY  1978  . CHOLECYSTECTOMY  2005  . COLONOSCOPY    . disc repair  05/17/2019   with rods, lumbar spine  . GASTRIC ROUX-EN-Y N/A 03/21/2017   Procedure: LAPAROSCOPIC ROUX-EN-Y GASTRIC, UPPER ENDO;  Surgeon: Greer Pickerel, MD;  [facility];  Service: General;  Laterality: N/A;  . GASTROSTOMY N/A 06/19/2018   Procedure: LAPRASCOPIC INSERTION OF GASTROSTOMY TUBE;  Surgeon: Greer Pickerel, MD;  [facility];  Service: General;  Laterality: N/A;  . GASTROSTOMY TUBE PLACEMENT Left    06/2018  . HERNIA REPAIR    . HIATAL HERNIA REPAIR N/A 06/19/2018   Procedure: LAPAROSCOPIC REPAIR OF HIATAL HERNIA;  Surgeon: Greer Pickerel, MD;  [facility];  Service: General;  Laterality: N/A;  . LAPAROSCOPIC LYSIS  OF ADHESIONS  03/19/2019   Dr. Romana Juniper  . LAPAROSCOPY N/A 06/19/2018   Procedure: LAPAROSCOPY DIAGNOSTIC;  Surgeon: Greer Pickerel, MD;  [facility];  Service: General;  Laterality: N/A;  . LAPAROSCOPY N/A 03/19/2019   Procedure: LAPAROSCOPY DIAGNOSTIC lysis of adhesions;  Surgeon: Clovis Riley, MD;  [facility];  Service: General;  Laterality: N/A;  . LUMBAR LAMINECTOMY/DECOMPRESSION MICRODISCECTOMY Right 10/11/2018   Procedure: LAMINECTOMY AND FORAMINOTOMY RIGHT LUMBAR FOUR- LUMBAR FIVE;  Surgeon: Consuella Lose, MD;  Location: [facility];  Service: Neurosurgery;  Laterality: Right;  . SHOULDER ARTHROSCOPY  7/12   left-dsc  . TONSILLECTOMY  at age 22  . TRIGGER FINGER RELEASE  12/20/2012   Procedure: RELEASE TRIGGER  FINGER/A-1 PULLEY;  Surgeon: Tennis Must, MD;  Location: [facility];  Service: Orthopedics;  Laterality: Left;  LEFT TRIGGER THUMB RELEASE    There were no vitals filed for this visit.  Subjective Assessment - 01/03/20 0956    Subjective  COVID-19 screen performed prior to patient entering clinic. MD wants me to strengthen my RT ankle. Sore 1-2/10 today    Pertinent History  Lumbar surgery x 2, DM, left shoulder surgery.    Patient Stated Goals  Get out of boot and walk without pain.    Currently in Pain?  Yes    Pain Score  2     Pain Location  Ankle    Pain Orientation  Right    Pain Descriptors / Indicators  Sore    Pain Onset  More than a month ago                       [facility] Adult PT Treatment/Exercise - 01/03/20 0001      Exercises   Exercises  Ankle      Modalities   Modalities  Electrical Stimulation;Iontophoresis;Moist Heat      Vasopneumatic   Number Minutes Vasopneumatic   15 minutes    Vasopnuematic Location   --   Right ankle.   Vasopneumatic Pressure  Low    Vasopneumatic Temperature   40      Manual Therapy   Manual Therapy  --    Passive ROM  --      Ankle Exercises: Aerobic   Recumbent Bike  L1 x 10 mins      Ankle Exercises: Standing   Rocker Board  3 minutes   PF/DF and calf stretching     Ankle Exercises: Seated   Other Seated Ankle Exercises  Ankle isolator  1/2 # x 3 mins DF, CW/CCW motions    Other Seated Ankle Exercises  seated dynadisc CW/CCW x5 mins ,      Ankle Exercises: Sidelying   Ankle Eversion Weights (lbs)  ankle isolator 1/2# 4x fatigue     Discussed HEP tband exs             PT Long Term Goals - 01/03/20 1239      PT LONG TERM GOAL #1   Title  Independent with a HEP.    Time  6    Period  Weeks    Status  On-going      PT LONG TERM GOAL #2   Title  Increase right ankle dorsiflexion to 8-10 degrees to normalize the patient's gait pattern.    Time  6    Period  Weeks    Status   Achieved      PT LONG TERM GOAL #3  Title  Increase ankle strength to 4+ to 5/5 to increase stability for functional tasks.    Time  6    Period  Weeks    Status  On-going      PT LONG TERM GOAL #4   Title  Walk in clinic without brace and no deviations.    Time  6    Period  Weeks    Status  On-going            Plan - 01/03/20 1235    Clinical Impression Statement  Pt arrived today doing better with decreased RT ankle pain. She was able to perform the bike today and progression of ankle exs without her brace.PROM 15 degrees.for DF. Normal modality response today    Comorbidities  Lumbar surgery x 2, DM, left shoulder surgery.    Examination-Activity Limitations  Locomotion Level;Other    Stability/Clinical Decision Making  Stable/Uncomplicated    Rehab Potential  Excellent    PT Frequency  2x / week    PT Duration  6 weeks    PT Treatment/Interventions  ADLs/Self Care Home Management;Cryotherapy;Electrical Stimulation;Gait training;Ultrasound;Moist Heat;Iontophoresis 4mg /ml Dexamethasone;Therapeutic exercise;Neuromuscular re-education;Therapeutic activities;Patient/family education;Manual techniques;Passive range of motion;Vasopneumatic Device    PT Next Visit Plan  RT ankle strengthening       Patient will benefit from skilled therapeutic intervention in order to improve the following deficits and impairments:     Visit Diagnosis: Stiffness of right ankle, not elsewhere classified  Pain in right ankle and joints of right foot     Problem List Patient Active Problem List   Diagnosis Date Noted  . AMS (altered mental status) 05/19/2019  . Thrombocytopenia ([place]) 05/19/2019  . SBO (small bowel obstruction) s/p lap LOA 03/19/2019 03/19/2019  . Chronic migraine without aura, with intractable migraine, so stated, with status migrainosus 11/20/2018  . Lumbar radiculopathy 10/11/2018  . Atelectasis   . Hypoglycemia   . Hypotension   . Bradycardia   . Epigastric pain  06/19/2018  . History of adenomatous polyp of colon 10/23/2017  . Hyperlipidemia associated with type 2 diabetes mellitus ([place]) 07/06/2017  . GERD (gastroesophageal reflux disease) 03/21/2017  . History of Roux-en-Y gastric bypass 2018 03/21/2017  . Morbid obesity with BMI of 40.0-44.9, adult ([place]) 06/06/2016  . Intractable chronic migraine without aura 06/04/2015  . Abnormal uterine bleeding 05/13/2015  . Pancreatitis 02/27/2014  . Fatty liver disease, nonalcoholic [id number]  . Bipolar disorder ([place]) 02/20/2014  . Metabolic syndrome [id number]  . Vitamin D deficiency   . DM (diabetes mellitus) ([place]) 03/12/2013  . Surgery, elective 06/12/2011  . Hypothyroidism   . Constipation, chronic   . Vitamin B 12 deficiency   . ALLERGIC RHINITIS 12/31/2010  . BARRETTS ESOPHAGUS 12/31/2010  . Gastroparesis 12/31/2010  . Bilateral chronic knee pain 12/31/2010  . Obstructive sleep apnea 09/27/2010  . Migraine 09/27/2010  . RESPIRATORY FAILURE, CHRONIC 09/27/2010  . Cyst of ovary 10/19/2009    Rosalyn Archambault,CHRIS, PTA 01/03/2020, 12:40 PM  [facility] [place], [place], [address] Phone: [phone]   Fax:  [phone]  Name: Morgan [name] MRN: [id number] Date of Birth: 12-14-1965

## 2020-01-08 DIAGNOSIS — M542 Cervicalgia: Secondary | ICD-10-CM | POA: Diagnosis not present

## 2020-01-08 DIAGNOSIS — M25561 Pain in right knee: Secondary | ICD-10-CM | POA: Diagnosis not present

## 2020-01-10 ENCOUNTER — Ambulatory Visit: Payer: Medicare Other | Admitting: *Deleted

## 2020-01-10 ENCOUNTER — Other Ambulatory Visit: Payer: Self-pay

## 2020-01-10 DIAGNOSIS — M25571 Pain in right ankle and joints of right foot: Secondary | ICD-10-CM | POA: Diagnosis not present

## 2020-01-10 DIAGNOSIS — M25671 Stiffness of right ankle, not elsewhere classified: Secondary | ICD-10-CM

## 2020-01-10 NOTE — Therapy (Signed)
[facility] [place], [place], [address] Phone: [phone]   Fax:  [phone]  Physical Therapy Treatment  Patient Details  Name: BREYONNA SORRELLS MRN: [id number] Date of Birth: 1965-12-25 Referring Provider (PT): Radene Journey MD   Encounter Date: 01/10/2020  PT End of Session - 01/10/20 1023    Visit Number  6    Number of Visits  12    Date for PT Re-Evaluation  02/06/20    Authorization Type  FOTO AT LEAST EVERY 5TH VISIT.  PROGRESS NOTE AT 10TH VISIT.    PT Start Time  0945    PT Stop Time  1035    PT Time Calculation (min)  50 min       Past Medical History:  Diagnosis Date  . Allergic rhinitis   . Anemia     after gastric bypass in 2018  . Anxiety   . Barrett's esophagus   . Bipolar affective disorder ([place])   . CAP (community acquired pneumonia) 07/20/2018  . Chronic respiratory failure ([place])   . Complication of anesthesia    "they had a hard time keeping my blood sugar up after back surgery in April 2020" per pt  . Constipation, chronic   . Degenerative joint disease of spine   . Depression   . Diabetes mellitus without complication ([place])    type 2   . DM (diabetes mellitus) ([place])    "hypoglycemic" per pt - no longer takes DM meds due to weight loss from gastric bypass in 2018  . Dry eye   . Family history of adverse reaction to anesthesia    nausea and vomiting  . Gastroparesis   . GERD (gastroesophageal reflux disease)   . History of bariatric surgery 03/2017  . History of hiatal hernia   . HLD (hyperlipidemia) 03/12/2013  . Hyperlipidemia   . Hypertension    No HTN meds since weight loss from Gastric Bypass in 2018  . Intractable chronic migraine without aura 06/04/2015  . Migraine headache   . Morbid obesity ([place])   . OSA (obstructive sleep apnea)    No cpap since gastric surgery  . Osteoarthritis    bilateral knee  . SBO (small bowel obstruction) ([place]) 03/19/2019  . Sleep apnea   .  Unspecified hypothyroidism   . Vitamin B 12 deficiency   . Vitamin D deficiency     Past Surgical History:  Procedure Laterality Date  . ANKLE ARTHROSCOPY WITH RECONSTRUCTION Right 09/24/2019   Procedure: ANKLE ARTHROSCOPY DEBRIDEMENT TREATMENT OF OSTEOCHONDRAL LESION TALUS. PRONEAL TENDON DEBRIDEMENT;  Surgeon: Erle Crocker, MD;  Location: [facility];  Service: Orthopedics;  Laterality: Right;  SURGERY REQUEST TIME 2 HOURS  . APPENDECTOMY  1978  . CHOLECYSTECTOMY  2005  . COLONOSCOPY    . disc repair  05/17/2019   with rods, lumbar spine  . GASTRIC ROUX-EN-Y N/A 03/21/2017   Procedure: LAPAROSCOPIC ROUX-EN-Y GASTRIC, UPPER ENDO;  Surgeon: Greer Pickerel, MD;  Location: [facility];  Service: General;  Laterality: N/A;  . GASTROSTOMY N/A 06/19/2018   Procedure: LAPRASCOPIC INSERTION OF GASTROSTOMY TUBE;  Surgeon: Greer Pickerel, MD;  Location: [facility];  Service: General;  Laterality: N/A;  . GASTROSTOMY TUBE PLACEMENT Left    06/2018  . HERNIA REPAIR    . HIATAL HERNIA REPAIR N/A 06/19/2018   Procedure: LAPAROSCOPIC REPAIR OF HIATAL HERNIA;  Surgeon: Greer Pickerel, MD;  Location: [facility];  Service: General;  Laterality: N/A;  . LAPAROSCOPIC LYSIS  OF ADHESIONS  03/19/2019   Dr. Romana Juniper  . LAPAROSCOPY N/A 06/19/2018   Procedure: LAPAROSCOPY DIAGNOSTIC;  Surgeon: Greer Pickerel, MD;  Location: [facility];  Service: General;  Laterality: N/A;  . LAPAROSCOPY N/A 03/19/2019   Procedure: LAPAROSCOPY DIAGNOSTIC lysis of adhesions;  Surgeon: Clovis Riley, MD;  Location: [facility];  Service: General;  Laterality: N/A;  . LUMBAR LAMINECTOMY/DECOMPRESSION MICRODISCECTOMY Right 10/11/2018   Procedure: LAMINECTOMY AND FORAMINOTOMY RIGHT LUMBAR FOUR- LUMBAR FIVE;  Surgeon: Consuella Lose, MD;  Location: [facility];  Service: Neurosurgery;  Laterality: Right;  . SHOULDER ARTHROSCOPY  7/12   left-dsc  . TONSILLECTOMY  at age 64  . TRIGGER FINGER RELEASE  12/20/2012   Procedure: RELEASE TRIGGER  FINGER/A-1 PULLEY;  Surgeon: Tennis Must, MD;  Location: [facility];  Service: Orthopedics;  Laterality: Left;  LEFT TRIGGER THUMB RELEASE    There were no vitals filed for this visit.  Subjective Assessment - 01/10/20 0944    Subjective  COVID-19 screen performed prior to patient entering clinic. RT ankle sore after last Rx, but better today    Pertinent History  Lumbar surgery x 2, DM, left shoulder surgery.    Patient Stated Goals  Get out of boot and walk without pain.    Currently in Pain?  Yes    Pain Score  2     Pain Orientation  Right    Pain Onset  More than a month ago                       [facility] Adult PT Treatment/Exercise - 01/10/20 0001      Exercises   Exercises  Ankle      Modalities   Modalities  Electrical Stimulation;Iontophoresis;Moist Heat      Vasopneumatic   Number Minutes Vasopneumatic   15 minutes    Vasopnuematic Location   --   Right ankle.   Vasopneumatic Pressure  Low    Vasopneumatic Temperature   36      Manual Therapy   Manual Therapy  Passive ROM    Passive ROM  PROM DF 10 degrees      Ankle Exercises: Aerobic   Recumbent Bike  L1 x 10 mins      Ankle Exercises: Standing   SLS  RT LE     Rocker Board  3 minutes;2 minutes   PF/DF and calf stretching, 2 min balance   Other Standing Ankle Exercises  dyna-disc x 3 mins PF/DF and CW/CCW      Ankle Exercises: Seated   Other Seated Ankle Exercises  Ankle isolator  1/2 # x 3 mins DF, CW/CCW motions    Other Seated Ankle Exercises  --      Ankle Exercises: Sidelying   Ankle Eversion Weights (lbs)  ankle isolator 1/2#  x 3 mins                  PT Long Term Goals - 01/10/20 1025      PT LONG TERM GOAL #1   Title  Independent with a HEP.    Time  6    Period  Weeks    Status  On-going      PT LONG TERM GOAL #2   Title  Increase right ankle dorsiflexion to 8-10 degrees to normalize the patient's gait pattern.    Time  6    Period  Weeks     Status  Achieved  PT LONG TERM GOAL #3   Title  Increase ankle strength to 4+ to 5/5 to increase stability for functional tasks.    Baseline  4/5    Time  6    Period  Weeks    Status  On-going      PT LONG TERM GOAL #4   Title  Walk in clinic without brace and no deviations.    Time  6    Period  Weeks    Status  Achieved            Plan - 01/10/20 0948    Clinical Impression Statement  Pt arrived today feeling ok after last visit and reports that she can walk much better now and her foot doesn't feel like it's going to "roll over". She was guided through strengthening and proprioception exs with mainly fatigue. SLS was performed today and found to be challenging to Pt with 5 sec max hold. Normal Vaso response today    Personal Factors and Comorbidities  Comorbidity 1    Comorbidities  Lumbar surgery x 2, DM, left shoulder surgery.    Examination-Activity Limitations  Locomotion Level;Other    Stability/Clinical Decision Making  Stable/Uncomplicated    Rehab Potential  Excellent    PT Frequency  2x / week    PT Duration  6 weeks    PT Treatment/Interventions  ADLs/Self Care Home Management;Cryotherapy;Electrical Stimulation;Gait training;Ultrasound;Moist Heat;Iontophoresis 4mg /ml Dexamethasone;Therapeutic exercise;Neuromuscular re-education;Therapeutic activities;Patient/family education;Manual techniques;Passive range of motion;Vasopneumatic Device    PT Next Visit Plan  RT ankle strengthening       Patient will benefit from skilled therapeutic intervention in order to improve the following deficits and impairments:  Pain, Decreased activity tolerance, Decreased range of motion  Visit Diagnosis: Pain in right ankle and joints of right foot  Stiffness of right ankle, not elsewhere classified     Problem List Patient Active Problem List   Diagnosis Date Noted  . AMS (altered mental status) 05/19/2019  . Thrombocytopenia ([place]) 05/19/2019  . SBO (small bowel  obstruction) s/p lap LOA 03/19/2019 03/19/2019  . Chronic migraine without aura, with intractable migraine, so stated, with status migrainosus 11/20/2018  . Lumbar radiculopathy 10/11/2018  . Atelectasis   . Hypoglycemia   . Hypotension   . Bradycardia   . Epigastric pain 06/19/2018  . History of adenomatous polyp of colon 10/23/2017  . Hyperlipidemia associated with type 2 diabetes mellitus ([place]) 07/06/2017  . GERD (gastroesophageal reflux disease) 03/21/2017  . History of Roux-en-Y gastric bypass 2018 03/21/2017  . Morbid obesity with BMI of 40.0-44.9, adult ([place]) 06/06/2016  . Intractable chronic migraine without aura 06/04/2015  . Abnormal uterine bleeding 05/13/2015  . Pancreatitis 02/27/2014  . Fatty liver disease, nonalcoholic [id number]  . Bipolar disorder ([place]) 02/20/2014  . Metabolic syndrome [id number]  . Vitamin D deficiency   . DM (diabetes mellitus) ([place]) 03/12/2013  . Surgery, elective 06/12/2011  . Hypothyroidism   . Constipation, chronic   . Vitamin B 12 deficiency   . ALLERGIC RHINITIS 12/31/2010  . BARRETTS ESOPHAGUS 12/31/2010  . Gastroparesis 12/31/2010  . Bilateral chronic knee pain 12/31/2010  . Obstructive sleep apnea 09/27/2010  . Migraine 09/27/2010  . RESPIRATORY FAILURE, CHRONIC 09/27/2010  . Cyst of ovary 10/19/2009    Keir Foland,CHRIS, PTA 01/10/2020, 12:29 PM  [facility] [address] [place], [place], [address] Phone: [phone]   Fax:  [phone]  Name: COREA NEYRA MRN: [id number] Date of Birth: 11/04/1966

## 2020-01-11 ENCOUNTER — Other Ambulatory Visit: Payer: Self-pay | Admitting: Adult Health

## 2020-01-13 ENCOUNTER — Other Ambulatory Visit: Payer: Self-pay | Admitting: *Deleted

## 2020-01-14 ENCOUNTER — Encounter: Payer: Self-pay | Admitting: Family Medicine

## 2020-01-14 ENCOUNTER — Telehealth (INDEPENDENT_AMBULATORY_CARE_PROVIDER_SITE_OTHER): Payer: Medicare Other | Admitting: Family Medicine

## 2020-01-14 DIAGNOSIS — G43009 Migraine without aura, not intractable, without status migrainosus: Secondary | ICD-10-CM | POA: Diagnosis not present

## 2020-01-14 MED ORDER — EMGALITY 120 MG/ML ~~LOC~~ SOAJ: 120.0000 mg | SUBCUTANEOUS | 11 refills | Status: DC

## 2020-01-14 MED ORDER — DICYCLOMINE HCL 20 MG PO TABS: 20.0000 mg | ORAL_TABLET | Freq: Three times a day (TID) | ORAL | 0 refills | Status: DC | PRN

## 2020-01-14 NOTE — Telephone Encounter (Signed)
Has appt today

## 2020-01-14 NOTE — Progress Notes (Addendum)
PATIENT: Morgan [name] DOB: 03/06/1966  REASON FOR VISIT: follow up HISTORY FROM: patient  Virtual Visit via Telephone Note  I connected with Morgan [name] on 01/14/20 at  8:30 AM EST by telephone and verified that I am speaking with the correct person using two identifiers.   I discussed the limitations, risks, security and privacy concerns of performing an evaluation and management service by telephone and the availability of in person appointments. I also discussed with the patient that there may be a patient responsible charge related to this service. The patient expressed understanding and agreed to proceed.   History of Present Illness:  01/14/20 Morgan [name] is a 54 y.o. female here today for follow up.She continues divalproex 500mg  and 1000mg  at night, gabapentin 600mg  in the am, 300mg  at lunch and 600mg  at bedtime and Ajovy monthly. She does feel that headaches are less severe but continue daily. She feels that pounding headaches are much less severe. She is tolerating medications well, however, insurance is requiring that she try Emgality. They will no longer pay for Ajovy. Insurance will cover [employer]. She is willing to switch medications. She continues to have chronic neck pain managed by orthopedics at this time. She is current treating with steroid injections.   History (copied from Dr Cathren Laine note on 05/22/2019)  Interval history: 24 headache days a month. Most are migraines. 15 are severe. Ajovy may be helping a little, she has had so much going on she doesn't know if that is contributing. She had emergency surgery in April and recent back surgery. She is taking oxycontin and 325mg  tylenol. She will keep on the Ajovy and when all the issues are calmed down she will call us and discuss nerve blocks.   HPI:  Morgan [name] is a 54 y.o. female here for follow up of migraines.  She has a past medical history of Barrett's esophagus, bipolar affective disorder,  community-acquired pneumonia in August of this year, chronic respiratory failure, constipation, diabetes, dry eyes, gastroparesis, GERD, history of bariatric surgery, hyperlipidemia, hypertension, intractable chronic migraine without aura, obstructive sleep apnea, hypothyroidism, B12 deficiency, vitamin D deficiency.    This is the first time I am seeing her.  She has been a patient of our practice and is come to me today for another opinion.  Patient has a history of chronic migraines.  She has tried and failed multiple medications.  Initially she was started on Aimovig and her headaches improved.  She had 2 falls in August 2019 she went to the hospital and CT scan of the head was unremarkable.  Since then she has daily headaches.  She had minimal benefit with a steroid Dosepak.  Her headaches typically start around midday occurs all over the head typically a squeezing pounding sensation.  She reports associated photophobia and phonophobia as well as nausea.  She does not have auras with most migraines.  However she does sometimes see spots in the right eye.  Unclear what this is if it is an aura or not.  She also reports associated dizziness.  She takes tramadol every 6 hours if she does not take tramadol she takes Tylenol.  She also had gastric bypass and she has lost a significant amount of weight, feeding tube had to be placed because of malnutrition.  Initially after Aimovig she had a significant decrease in her headaches from having a daily headache down to 10 days a month.  The second month it improved to 8 headaches  a month from daily. She is still on the aimovig. Has daily hedaches. She is not overtaking tylenol or tramadol. They are daily migraines. She wakes with the headaches. MRI of the brain was normal. Pressure/pulsating/pounding. Light and sound sensitivity. Migraines are moderately severe to severe. No medication overuse. Movement makes it worse. +nausea. She is compliant. Her levels of  depakote and gabapentin were fine. She has a detailed migraine diary. Last 24 hours - 72 hours.   Meds tried: Gabapentin, depakote, tizanidine, zoloft, botox for migraine x 4. Aimovig x 4. And many other meds.     Observations/Objective:  Generalized: Well developed, in no acute distress  Mentation: Alert oriented to time, place, history taking. Follows all commands speech and language fluent   Assessment and Plan:  54 y.o. year old female  has a past medical history of Allergic rhinitis, Anemia, Anxiety, Barrett's esophagus, Bipolar affective disorder ([place]), CAP (community acquired pneumonia) (07/20/2018), Chronic respiratory failure ([place]), Complication of anesthesia, Constipation, chronic, Degenerative joint disease of spine, Depression, Diabetes mellitus without complication ([place]), DM (diabetes mellitus) ([place]), Dry eye, Family history of adverse reaction to anesthesia, Gastroparesis, GERD (gastroesophageal reflux disease), History of bariatric surgery (03/2017), History of hiatal hernia, HLD (hyperlipidemia) (03/12/2013), Hyperlipidemia, Hypertension, Intractable chronic migraine without aura (06/04/2015), Migraine headache, Morbid obesity ([place]), OSA (obstructive sleep apnea), Osteoarthritis, SBO (small bowel obstruction) ([place]) (03/19/2019), Sleep apnea, Unspecified hypothyroidism, Vitamin B 12 deficiency, and Vitamin D deficiency. here with    ICD-10-CM   1. Migraine without aura and without status migrainosus, not intractable  G43.009    Morgan [name] is doing well, overall. She does note improvement of headache intensity but continues to have daily headaches. We will continue divalproex and gabapentin as prescribed. We will stop Ajovy and start Emgality per insurance. She was educated on appropriate administration and storage of medication. She will continue close follow up with ortho for neck pain. Adequate hydration, well balanced diet and regular exercise advised. She will follow up with me in 3-6  months, sooner if needed. She verbalizes understanding and agreement with this plan.   No orders of the defined types were placed in this encounter.   Meds ordered this encounter  Medications  . Galcanezumab-gnlm (EMGALITY) 120 MG/ML SOAJ    Sig: Inject 120 mg into the skin every 30 (thirty) days.    Dispense:  1 pen    Refill:  11    Order Specific Question:   Supervising Provider    Answer:   Melvenia Beam [id number]     Follow Up Instructions:  I discussed the assessment and treatment plan with the patient. The patient was provided an opportunity to ask questions and all were answered. The patient agreed with the plan and demonstrated an understanding of the instructions.   The patient was advised to call back or seek an in-person evaluation if the symptoms worsen or if the condition fails to improve as anticipated.  I provided 25 minutes of non-face-to-face time during this encounter. Patient is located at her place of residence during [place] visit. Provider is in the office.    Debbora Presto, NP   Made any corrections needed, and agree with history, physical, neuro exam,assessment and plan as stated.     Sarina Ill, MD [facility]

## 2020-01-16 NOTE — Telephone Encounter (Signed)
Per optum, ajovy denied. Pt must first try emgality. Please see recent office visit. Patient has been switched to emgality.

## 2020-01-17 ENCOUNTER — Other Ambulatory Visit: Payer: Self-pay

## 2020-01-17 ENCOUNTER — Ambulatory Visit: Payer: Medicare Other | Attending: Orthopaedic Surgery | Admitting: *Deleted

## 2020-01-17 DIAGNOSIS — M25671 Stiffness of right ankle, not elsewhere classified: Secondary | ICD-10-CM | POA: Diagnosis not present

## 2020-01-17 DIAGNOSIS — M25571 Pain in right ankle and joints of right foot: Secondary | ICD-10-CM | POA: Diagnosis not present

## 2020-01-17 NOTE — Therapy (Signed)
[name], [place], [address] Phone: [phone]   Fax:  [phone]  Physical Therapy Treatment  Patient Details  Name: Morgan [name] MRN: [id number] Date of Birth: February 06, 1966 Referring Provider (PT): Radene Journey MD   Encounter Date: 01/17/2020  PT End of Session - 01/17/20 0811    Visit Number  7    Number of Visits  12    Date for PT Re-Evaluation  02/06/20    Authorization Type  FOTO AT LEAST EVERY 5TH VISIT.  PROGRESS NOTE AT 10TH VISIT.    PT Start Time  0815    PT Stop Time  0905    PT Time Calculation (min)  50 min       Past Medical History:  Diagnosis Date  . Allergic rhinitis   . Anemia     after gastric bypass in 2018  . Anxiety   . Barrett's esophagus   . Bipolar affective disorder ([place])   . CAP (community acquired pneumonia) 07/20/2018  . Chronic respiratory failure ([place])   . Complication of anesthesia    "they had a hard time keeping my blood sugar up after back surgery in April 2020" per pt  . Constipation, chronic   . Degenerative joint disease of spine   . Depression   . Diabetes mellitus without complication ([place])    type 2   . DM (diabetes mellitus) ([place])    "hypoglycemic" per pt - no longer takes DM meds due to weight loss from gastric bypass in 2018  . Dry eye   . Family history of adverse reaction to anesthesia    nausea and vomiting  . Gastroparesis   . GERD (gastroesophageal reflux disease)   . History of bariatric surgery 03/2017  . History of hiatal hernia   . HLD (hyperlipidemia) 03/12/2013  . Hyperlipidemia   . Hypertension    No HTN meds since weight loss from Gastric Bypass in 2018  . Intractable chronic migraine without aura 06/04/2015  . Migraine headache   . Morbid obesity ([place])   . OSA (obstructive sleep apnea)    No cpap since gastric surgery  . Osteoarthritis    bilateral knee  . SBO (small bowel obstruction) ([place]) 03/19/2019  . Sleep apnea   . Unspecified  hypothyroidism   . Vitamin B 12 deficiency   . Vitamin D deficiency     Past Surgical History:  Procedure Laterality Date  . ANKLE ARTHROSCOPY WITH RECONSTRUCTION Right 09/24/2019   Procedure: ANKLE ARTHROSCOPY DEBRIDEMENT TREATMENT OF OSTEOCHONDRAL LESION TALUS. PRONEAL TENDON DEBRIDEMENT;  Surgeon: Erle Crocker, MD;  Location: [facility];  Service: Orthopedics;  Laterality: Right;  SURGERY REQUEST TIME 2 HOURS  . APPENDECTOMY  1978  . CHOLECYSTECTOMY  2005  . COLONOSCOPY    . disc repair  05/17/2019   with rods, lumbar spine  . GASTRIC ROUX-EN-Y N/A 03/21/2017   Procedure: LAPAROSCOPIC ROUX-EN-Y GASTRIC, UPPER ENDO;  Surgeon: Greer Pickerel, MD;  Location[facility];  Service: General;  Laterality: N/A;  . GASTROSTOMY N/A 06/19/2018   Procedure: LAPRASCOPIC INSERTION OF GASTROSTOMY TUBE;  Surgeon: Greer Pickerel, MD;  Location: [facility];  Service: General;  Laterality: N/A;  . GASTROSTOMY TUBE PLACEMENT Left    06/2018  . HERNIA REPAIR    . HIATAL HERNIA REPAIR N/A 06/19/2018   Procedure: LAPAROSCOPIC REPAIR OF HIATAL HERNIA;  Surgeon: Greer Pickerel, MD;  [facility];  Service: General;  Laterality: N/A;  . LAPAROSCOPIC LYSIS  OF ADHESIONS  03/19/2019   Dr. Romana Juniper  . LAPAROSCOPY N/A 06/19/2018   Procedure: LAPAROSCOPY DIAGNOSTIC;  Surgeon: Greer Pickerel, MD;  [facility];  Service: General;  Laterality: N/A;  . LAPAROSCOPY N/A 03/19/2019   Procedure: LAPAROSCOPY DIAGNOSTIC lysis of adhesions;  Surgeon: Clovis Riley, MD;  Location[facility];  Service: General;  Laterality: N/A;  . LUMBAR LAMINECTOMY/DECOMPRESSION MICRODISCECTOMY Right 10/11/2018   Procedure: LAMINECTOMY AND FORAMINOTOMY RIGHT LUMBAR FOUR- LUMBAR FIVE;  Surgeon: Consuella Lose, MD;  Location: [facility];  Service: Neurosurgery;  Laterality: Right;  . SHOULDER ARTHROSCOPY  7/12   left-dsc  . TONSILLECTOMY  at age 54  . TRIGGER FINGER RELEASE  12/20/2012   Procedure: RELEASE TRIGGER FINGER/A-1  PULLEY;  Surgeon: Tennis Must, MD;  Location: [place];  Service: Orthopedics;  Laterality: Left;  LEFT TRIGGER THUMB RELEASE    There were no vitals filed for this visit.  Subjective Assessment - 01/17/20 0810    Subjective  COVID-19 screen performed prior to patient entering clinic. RT ankle sore after last Rx, but better today. 3/10 pain today    Pertinent History  Lumbar surgery x 2, DM, left shoulder surgery.    Patient Stated Goals  Get out of boot and walk without pain.                       [facility] Adult PT Treatment/Exercise - 01/17/20 0001      Exercises   Exercises  Ankle      Modalities   Modalities  Electrical Stimulation;Iontophoresis;Moist Heat      Vasopneumatic   Number Minutes Vasopneumatic   15 minutes    Vasopnuematic Location   --   Right ankle.   Vasopneumatic Pressure  Low    Vasopneumatic Temperature   36      Manual Therapy   Manual Therapy  Passive ROM    Passive ROM  PROM DF 10 degrees      Ankle Exercises: Aerobic   Recumbent Bike  L1 x 10 mins      Ankle Exercises: Standing   SLS  RT LE     Rocker Board  3 minutes;2 minutes   PF/DF and calf stretching, 2 min balance   Other Standing Ankle Exercises  dyna-disc x 3 mins PF/DF and CW/CCW      Ankle Exercises: Seated   Other Seated Ankle Exercises  Ankle isolator  1/2 # at end x 3 mins DF, CW/CCW motions      Ankle Exercises: Sidelying   Ankle Eversion Weights (lbs)  ankle isolator 1/2#  x 3 mins                  PT Long Term Goals - 01/10/20 1025      PT LONG TERM GOAL #1   Title  Independent with a HEP.    Time  6    Period  Weeks    Status  On-going      PT LONG TERM GOAL #2   Title  Increase right ankle dorsiflexion to 8-10 degrees to normalize the patient's gait pattern.    Time  6    Period  Weeks    Status  Achieved      PT LONG TERM GOAL #3   Title  Increase ankle strength to 4+ to 5/5 to increase stability for functional tasks.     Baseline  4/5    Time  6  Period  Weeks    Status  On-going      PT LONG TERM GOAL #4   Title  Walk in clinic without brace and no deviations.    Time  6    Period  Weeks    Status  Achieved            Plan - 01/17/20 [id number]    Clinical Impression Statement  Pt arrived today with RT ankle 3/10 pain. She reports only wearing brace outside the house now. Rx todaybrace on with WB exs, but next visit Pt will perform all of Rx without brace. She was able to perform ankle isolator with 1/2# at the tip today Pt reports doing better with strengthening and she can tell Rxs are helping.RT ankle ev / Ev 4/5 MMT and close to meeting LTG. normal vaso response    Personal Factors and Comorbidities  Comorbidity 1    Comorbidities  Lumbar surgery x 2, DM, left shoulder surgery.    Examination-Activity Limitations  Locomotion Level;Other    Stability/Clinical Decision Making  Stable/Uncomplicated    Rehab Potential  Excellent    PT Frequency  2x / week    PT Duration  6 weeks    PT Treatment/Interventions  ADLs/Self Care Home Management;Cryotherapy;Electrical Stimulation;Gait training;Ultrasound;Moist Heat;Iontophoresis 4mg /ml Dexamethasone;Therapeutic exercise;Neuromuscular re-education;Therapeutic activities;Patient/family education;Manual techniques;Passive range of motion;Vasopneumatic Device    PT Next Visit Plan  RT ankle strengthening    Consulted and Agree with Plan of Care  Patient       Patient will benefit from skilled therapeutic intervention in order to improve the following deficits and impairments:  Pain, Decreased activity tolerance, Decreased range of motion  Visit Diagnosis: Pain in right ankle and joints of right foot  Stiffness of right ankle, not elsewhere classified     Problem List Patient Active Problem List   Diagnosis Date Noted  . AMS (altered mental status) 05/19/2019  . Thrombocytopenia ([place]) 05/19/2019  . SBO (small bowel obstruction) s/p lap LOA  03/19/2019 03/19/2019  . Chronic migraine without aura, with intractable migraine, so stated, with status migrainosus 11/20/2018  . Lumbar radiculopathy 10/11/2018  . Atelectasis   . Hypoglycemia   . Hypotension   . Bradycardia   . Epigastric pain 06/19/2018  . History of adenomatous polyp of colon 10/23/2017  . Hyperlipidemia associated with type 2 diabetes mellitus ([place]) 07/06/2017  . GERD (gastroesophageal reflux disease) 03/21/2017  . History of Roux-en-Y gastric bypass 2018 03/21/2017  . Morbid obesity with BMI of 40.0-44.9, adult ([place]) 06/06/2016  . Intractable chronic migraine without aura 06/04/2015  . Abnormal uterine bleeding 05/13/2015  . Pancreatitis 02/27/2014  . Fatty liver disease, nonalcoholic [id number]  . Bipolar disorder ([place]) 02/20/2014  . Metabolic syndrome [id number]  . Vitamin D deficiency   . DM (diabetes mellitus) ([place]) 03/12/2013  . Surgery, elective 06/12/2011  . Hypothyroidism   . Constipation, chronic   . Vitamin B 12 deficiency   . ALLERGIC RHINITIS 12/31/2010  . BARRETTS ESOPHAGUS 12/31/2010  . Gastroparesis 12/31/2010  . Bilateral chronic knee pain 12/31/2010  . Obstructive sleep apnea 09/27/2010  . Migraine 09/27/2010  . RESPIRATORY FAILURE, CHRONIC 09/27/2010  . Cyst of ovary 10/19/2009    Ching Rabideau,CHRIS, PTA 01/17/2020, 9:13 AM  [facility] [place], [place], [address] Phone: [phone]   Fax:  [phone]  Name: CARRIEANNE BERARDINO MRN: [id number] Date of Birth: 10/02/1966

## 2020-01-21 ENCOUNTER — Other Ambulatory Visit: Payer: Self-pay

## 2020-01-21 ENCOUNTER — Ambulatory Visit: Payer: Medicare Other | Admitting: *Deleted

## 2020-01-21 DIAGNOSIS — M25571 Pain in right ankle and joints of right foot: Secondary | ICD-10-CM | POA: Diagnosis not present

## 2020-01-21 DIAGNOSIS — M25671 Stiffness of right ankle, not elsewhere classified: Secondary | ICD-10-CM

## 2020-01-21 NOTE — Therapy (Signed)
[facility] [place], [place], [address] Phone: [phone]   Fax:  [phone]  Physical Therapy Treatment  Patient Details  Name: Morgan [name] MRN: [id number] Date of Birth: 1965/12/14 Referring Provider (PT): Radene Journey MD   Encounter Date: 01/21/2020  PT End of Session - 01/21/20 0807    Visit Number  8    Number of Visits  12    Date for PT Re-Evaluation  02/06/20    Authorization Type  FOTO AT LEAST EVERY 5TH VISIT.  PROGRESS NOTE AT 10TH VISIT.    PT Start Time  0815    PT Stop Time  0906    PT Time Calculation (min)  51 min       Past Medical History:  Diagnosis Date  . Allergic rhinitis   . Anemia     after gastric bypass in 2018  . Anxiety   . Barrett's esophagus   . Bipolar affective disorder ([place])   . CAP (community acquired pneumonia) 07/20/2018  . Chronic respiratory failure ([place])   . Complication of anesthesia    "they had a hard time keeping my blood sugar up after back surgery in April 2020" per pt  . Constipation, chronic   . Degenerative joint disease of spine   . Depression   . Diabetes mellitus without complication ([place])    type 2   . DM (diabetes mellitus) ([place])    "hypoglycemic" per pt - no longer takes DM meds due to weight loss from gastric bypass in 2018  . Dry eye   . Family history of adverse reaction to anesthesia    nausea and vomiting  . Gastroparesis   . GERD (gastroesophageal reflux disease)   . History of bariatric surgery 03/2017  . History of hiatal hernia   . HLD (hyperlipidemia) 03/12/2013  . Hyperlipidemia   . Hypertension    No HTN meds since weight loss from Gastric Bypass in 2018  . Intractable chronic migraine without aura 06/04/2015  . Migraine headache   . Morbid obesity ([place])   . OSA (obstructive sleep apnea)    No cpap since gastric surgery  . Osteoarthritis    bilateral knee  . SBO (small bowel obstruction) ([place]) 03/19/2019  . Sleep apnea   . Unspecified  hypothyroidism   . Vitamin B 12 deficiency   . Vitamin D deficiency     Past Surgical History:  Procedure Laterality Date  . ANKLE ARTHROSCOPY WITH RECONSTRUCTION Right 09/24/2019   Procedure: ANKLE ARTHROSCOPY DEBRIDEMENT TREATMENT OF OSTEOCHONDRAL LESION TALUS. PRONEAL TENDON DEBRIDEMENT;  Surgeon: Erle Crocker, MD;  Location: [facility];  Service: Orthopedics;  Laterality: Right;  SURGERY REQUEST TIME 2 HOURS  . APPENDECTOMY  1978  . CHOLECYSTECTOMY  2005  . COLONOSCOPY    . disc repair  05/17/2019   with rods, lumbar spine  . GASTRIC ROUX-EN-Y N/A 03/21/2017   Procedure: LAPAROSCOPIC ROUX-EN-Y GASTRIC, UPPER ENDO;  Surgeon: Greer Pickerel, MD;  [facility];  Service: General;  Laterality: N/A;  . GASTROSTOMY N/A 06/19/2018   Procedure: LAPRASCOPIC INSERTION OF GASTROSTOMY TUBE;  Surgeon: Greer Pickerel, MD;  [facility];  Service: General;  Laterality: N/A;  . GASTROSTOMY TUBE PLACEMENT Left    06/2018  . HERNIA REPAIR    . HIATAL HERNIA REPAIR N/A 06/19/2018   Procedure: LAPAROSCOPIC REPAIR OF HIATAL HERNIA;  Surgeon: Greer Pickerel, MD;  [facility];  Service: General;  Laterality: N/A;  . LAPAROSCOPIC LYSIS  OF ADHESIONS  03/19/2019   Dr. Romana Juniper  . LAPAROSCOPY N/A 06/19/2018   Procedure: LAPAROSCOPY DIAGNOSTIC;  Surgeon: Greer Pickerel, MD;  [facility];  Service: General;  Laterality: N/A;  . LAPAROSCOPY N/A 03/19/2019   Procedure: LAPAROSCOPY DIAGNOSTIC lysis of adhesions;  Surgeon: Clovis Riley, MD;  [facility];  Service: General;  Laterality: N/A;  . LUMBAR LAMINECTOMY/DECOMPRESSION MICRODISCECTOMY Right 10/11/2018   Procedure: LAMINECTOMY AND FORAMINOTOMY RIGHT LUMBAR FOUR- LUMBAR FIVE;  Surgeon: Consuella Lose, MD;  Location: [facility];  Service: Neurosurgery;  Laterality: Right;  . SHOULDER ARTHROSCOPY  7/12   left-dsc  . TONSILLECTOMY  at age 6  . TRIGGER FINGER RELEASE  12/20/2012   Procedure: RELEASE TRIGGER FINGER/A-1  PULLEY;  Surgeon: Tennis Must, MD;  Location: [place];  Service: Orthopedics;  Laterality: Left;  LEFT TRIGGER THUMB RELEASE    There were no vitals filed for this visit.                    [facility] Adult PT Treatment/Exercise - 01/21/20 0001      Exercises   Exercises  Ankle      Modalities   Modalities  Electrical Stimulation;Iontophoresis;Moist Heat      Vasopneumatic   Number Minutes Vasopneumatic   15 minutes    Vasopnuematic Location   --   Right ankle.   Vasopneumatic Pressure  Low    Vasopneumatic Temperature   36      Ankle Exercises: Aerobic   Recumbent Bike  L1 x 10 mins      Ankle Exercises: Standing   SLS  RT LE     Rocker Board  3 minutes;2 minutes   PF/DF and calf stretching, 2 min balance   Other Standing Ankle Exercises  dyna-disc x 3 mins PF/DF and CW/CCW      Ankle Exercises: Seated   Other Seated Ankle Exercises  Ankle isolator  1/2 # at end x 3 mins DF, CW/CCW motions      Ankle Exercises: Sidelying   Ankle Eversion Weights (lbs)  ankle isolator 1/2#  x 3 mins                  PT Long Term Goals - 01/10/20 1025      PT LONG TERM GOAL #1   Title  Independent with a HEP.    Time  6    Period  Weeks    Status  On-going      PT LONG TERM GOAL #2   Title  Increase right ankle dorsiflexion to 8-10 degrees to normalize the patient's gait pattern.    Time  6    Period  Weeks    Status  Achieved      PT LONG TERM GOAL #3   Title  Increase ankle strength to 4+ to 5/5 to increase stability for functional tasks.    Baseline  4/5    Time  6    Period  Weeks    Status  On-going      PT LONG TERM GOAL #4   Title  Walk in clinic without brace and no deviations.    Time  6    Period  Weeks    Status  Achieved            Plan - 01/21/20 [id number]    Clinical Impression Statement  Pt arrived today doing fairly well with RT ankle and was able to perform all exs  without brace donned. Mainly mm fatigue and  soreness, but no complaints of pain. Normal vaso response.    Comorbidities  Lumbar surgery x 2, DM, left shoulder surgery.    Examination-Activity Limitations  Locomotion Level;Other    Stability/Clinical Decision Making  Stable/Uncomplicated    Rehab Potential  Excellent    PT Frequency  2x / week    PT Duration  6 weeks    PT Treatment/Interventions  ADLs/Self Care Home Management;Cryotherapy;Electrical Stimulation;Gait training;Ultrasound;Moist Heat;Iontophoresis 4mg /ml Dexamethasone;Therapeutic exercise;Neuromuscular re-education;Therapeutic activities;Patient/family education;Manual techniques;Passive range of motion;Vasopneumatic Device    PT Next Visit Plan  RT ankle strengthening    Consulted and Agree with Plan of Care  Patient       Patient will benefit from skilled therapeutic intervention in order to improve the following deficits and impairments:  Pain, Decreased activity tolerance, Decreased range of motion  Visit Diagnosis: Stiffness of right ankle, not elsewhere classified  Pain in right ankle and joints of right foot     Problem List Patient Active Problem List   Diagnosis Date Noted  . AMS (altered mental status) 05/19/2019  . Thrombocytopenia ([place]) 05/19/2019  . SBO (small bowel obstruction) s/p lap LOA 03/19/2019 03/19/2019  . Chronic migraine without aura, with intractable migraine, so stated, with status migrainosus 11/20/2018  . Lumbar radiculopathy 10/11/2018  . Atelectasis   . Hypoglycemia   . Hypotension   . Bradycardia   . Epigastric pain 06/19/2018  . History of adenomatous polyp of colon 10/23/2017  . Hyperlipidemia associated with type 2 diabetes mellitus ([place]) 07/06/2017  . GERD (gastroesophageal reflux disease) 03/21/2017  . History of Roux-en-Y gastric bypass 2018 03/21/2017  . Morbid obesity with BMI of 40.0-44.9, adult ([place]) 06/06/2016  . Intractable chronic migraine without aura 06/04/2015  . Abnormal uterine bleeding 05/13/2015  .  Pancreatitis 02/27/2014  . Fatty liver disease, nonalcoholic [id number]  . Bipolar disorder ([place]) 02/20/2014  . Metabolic syndrome [id number]  . Vitamin D deficiency   . DM (diabetes mellitus) ([place]) 03/12/2013  . Surgery, elective 06/12/2011  . Hypothyroidism   . Constipation, chronic   . Vitamin B 12 deficiency   . ALLERGIC RHINITIS 12/31/2010  . BARRETTS ESOPHAGUS 12/31/2010  . Gastroparesis 12/31/2010  . Bilateral chronic knee pain 12/31/2010  . Obstructive sleep apnea 09/27/2010  . Migraine 09/27/2010  . RESPIRATORY FAILURE, CHRONIC 09/27/2010  . Cyst of ovary 10/19/2009    Ramiro Pangilinan,CHRIS, PTA 01/21/2020, 9:17 AM  [facility] [place], [place], [address] Phone: [phone]   Fax:  [phone]  Name: CITLALI RUS MRN: [id number] Date of Birth: 1966-03-28

## 2020-01-22 ENCOUNTER — Telehealth: Payer: Self-pay

## 2020-01-22 DIAGNOSIS — M542 Cervicalgia: Secondary | ICD-10-CM | POA: Diagnosis not present

## 2020-01-22 NOTE — Telephone Encounter (Signed)
Received fax for [name] PA.  States we need to call to initiate PA [phone]  Called started the initiation process; Representative hung up. Awaiting call back.

## 2020-01-22 NOTE — Telephone Encounter (Signed)
Re called provided number on fax and initiated the PA for PTs Emgality 120  Pt has been approved from 01/22/20-12/11/20 Awaiting approval fax   Ref # [id number]

## 2020-01-23 ENCOUNTER — Encounter: Payer: Self-pay | Admitting: Family Medicine

## 2020-01-23 ENCOUNTER — Ambulatory Visit: Payer: Medicare Other | Admitting: *Deleted

## 2020-01-23 ENCOUNTER — Other Ambulatory Visit: Payer: Self-pay

## 2020-01-23 DIAGNOSIS — M25571 Pain in right ankle and joints of right foot: Secondary | ICD-10-CM | POA: Diagnosis not present

## 2020-01-23 DIAGNOSIS — M25671 Stiffness of right ankle, not elsewhere classified: Secondary | ICD-10-CM | POA: Diagnosis not present

## 2020-01-23 NOTE — Telephone Encounter (Signed)
Faxed approval to pharmacy

## 2020-01-23 NOTE — Therapy (Signed)
[facility] [place], [place], [address] Phone: [phone]   Fax:  [phone]  Physical Therapy Treatment  Patient Details  Name: Morgan [name] MRN: [id number] Date of Birth: Oct 14, 1966 Referring Provider (PT): Radene Journey MD   Encounter Date: 01/23/2020  PT End of Session - 01/23/20 0810    Visit Number  9    Number of Visits  12    Date for PT Re-Evaluation  02/06/20    Authorization Type  FOTO AT LEAST EVERY 5TH VISIT.  PROGRESS NOTE AT 10TH VISIT.    PT Start Time  0815    PT Stop Time  0906    PT Time Calculation (min)  51 min       Past Medical History:  Diagnosis Date  . Allergic rhinitis   . Anemia     after gastric bypass in 2018  . Anxiety   . Barrett's esophagus   . Bipolar affective disorder ([place])   . CAP (community acquired pneumonia) 07/20/2018  . Chronic respiratory failure ([place])   . Complication of anesthesia    "they had a hard time keeping my blood sugar up after back surgery in April 2020" per pt  . Constipation, chronic   . Degenerative joint disease of spine   . Depression   . Diabetes mellitus without complication ([place])    type 2   . DM (diabetes mellitus) ([place])    "hypoglycemic" per pt - no longer takes DM meds due to weight loss from gastric bypass in 2018  . Dry eye   . Family history of adverse reaction to anesthesia    nausea and vomiting  . Gastroparesis   . GERD (gastroesophageal reflux disease)   . History of bariatric surgery 03/2017  . History of hiatal hernia   . HLD (hyperlipidemia) 03/12/2013  . Hyperlipidemia   . Hypertension    No HTN meds since weight loss from Gastric Bypass in 2018  . Intractable chronic migraine without aura 06/04/2015  . Migraine headache   . Morbid obesity ([place])   . OSA (obstructive sleep apnea)    No cpap since gastric surgery  . Osteoarthritis    bilateral knee  . SBO (small bowel obstruction) ([place]) 03/19/2019  . Sleep apnea   .  Unspecified hypothyroidism   . Vitamin B 12 deficiency   . Vitamin D deficiency     Past Surgical History:  Procedure Laterality Date  . ANKLE ARTHROSCOPY WITH RECONSTRUCTION Right 09/24/2019   Procedure: ANKLE ARTHROSCOPY DEBRIDEMENT TREATMENT OF OSTEOCHONDRAL LESION TALUS. PRONEAL TENDON DEBRIDEMENT;  Surgeon: Erle Crocker, MD;  Location: [facility];  Service: Orthopedics;  Laterality: Right;  SURGERY REQUEST TIME 2 HOURS  . APPENDECTOMY  1978  . CHOLECYSTECTOMY  2005  . COLONOSCOPY    . disc repair  05/17/2019   with rods, lumbar spine  . GASTRIC ROUX-EN-Y N/A 03/21/2017   Procedure: LAPAROSCOPIC ROUX-EN-Y GASTRIC, UPPER ENDO;  Surgeon: Greer Pickerel, MD;  [facility];  Service: General;  Laterality: N/A;  . GASTROSTOMY N/A 06/19/2018   Procedure: LAPRASCOPIC INSERTION OF GASTROSTOMY TUBE;  Surgeon: Greer Pickerel, MD;  [facility];  Service: General;  Laterality: N/A;  . GASTROSTOMY TUBE PLACEMENT Left    06/2018  . HERNIA REPAIR    . HIATAL HERNIA REPAIR N/A 06/19/2018   Procedure: LAPAROSCOPIC REPAIR OF HIATAL HERNIA;  Surgeon: Greer Pickerel, MD;  [facility];  Service: General;  Laterality: N/A;  . LAPAROSCOPIC LYSIS  OF ADHESIONS  03/19/2019   Dr. Romana Juniper  . LAPAROSCOPY N/A 06/19/2018   Procedure: LAPAROSCOPY DIAGNOSTIC;  Surgeon: Greer Pickerel, MD;  [facility];  Service: General;  Laterality: N/A;  . LAPAROSCOPY N/A 03/19/2019   Procedure: LAPAROSCOPY DIAGNOSTIC lysis of adhesions;  Surgeon: Clovis Riley, MD;  [facility];  Service: General;  Laterality: N/A;  . LUMBAR LAMINECTOMY/DECOMPRESSION MICRODISCECTOMY Right 10/11/2018   Procedure: LAMINECTOMY AND FORAMINOTOMY RIGHT LUMBAR FOUR- LUMBAR FIVE;  Surgeon: Consuella Lose, MD;  Location: [facility];  Service: Neurosurgery;  Laterality: Right;  . SHOULDER ARTHROSCOPY  7/12   left-dsc  . TONSILLECTOMY  at age 34  . TRIGGER FINGER RELEASE  12/20/2012   Procedure: RELEASE TRIGGER  FINGER/A-1 PULLEY;  Surgeon: Tennis Must, MD;  Location: [facility];  Service: Orthopedics;  Laterality: Left;  LEFT TRIGGER THUMB RELEASE    There were no vitals filed for this visit.  Subjective Assessment - 01/23/20 0834    Subjective  COVID-19 screen performed prior to patient entering clinic. RT ankle sore after last Rx, but better today. no pain just sore    Pertinent History  Lumbar surgery x 2, DM, left shoulder surgery.    Patient Stated Goals  Get out of boot and walk without pain.    Currently in Pain?  Yes    Pain Location  Ankle    Pain Orientation  Right    Pain Descriptors / Indicators  Sore                       [facility] Adult PT Treatment/Exercise - 01/23/20 0001      Exercises   Exercises  Ankle      Modalities   Modalities  Electrical Stimulation;Iontophoresis;Moist Heat      Vasopneumatic   Number Minutes Vasopneumatic   15 minutes    Vasopnuematic Location   --   Right ankle.   Vasopneumatic Pressure  Low    Vasopneumatic Temperature   36      Ankle Exercises: Aerobic   Recumbent Bike  L1 x 10 mins      Ankle Exercises: Standing   SLS  RT LE  x 5 max 15 sec hold today    Rocker Board  3 minutes;2 minutes    Other Standing Ankle Exercises  dyna-disc x 3 mins PF/DF and CW/CCW      Ankle Exercises: Seated   Other Seated Ankle Exercises  Ankle isolator  1/2 # at end x 3 mins DF, CW/CCW motions      Ankle Exercises: Sidelying   Ankle Eversion Weights (lbs)  ankle isolator 1/2#  x 3 mins                  PT Long Term Goals - 01/23/20 0847      PT LONG TERM GOAL #1   Title  Independent with a HEP.    Time  6    Period  Weeks    Status  On-going      PT LONG TERM GOAL #2   Title  Increase right ankle dorsiflexion to 8-10 degrees to normalize the patient's gait pattern.    Time  6    Period  Weeks    Status  Achieved      PT LONG TERM GOAL #3   Title  Increase ankle strength to 4+ to 5/5 to increase  stability for functional tasks.    Baseline  4/5  Time  6    Period  Weeks    Status  On-going      PT LONG TERM GOAL #4   Title  Walk in clinic without brace and no deviations.    Time  6    Period  Weeks    Status  Achieved            Plan - 01/23/20 [id number]    Clinical Impression Statement  Pt arrived doing very well with no RT ankle pain. She does have pain in her RT knee though that modifies  Rx plan. She was guided through RT ankle proprioception acts with SLS now improved to 15 sec hold from 10. She did well with strengthening exs with mainly fatigue. Normal modality response today.    Personal Factors and Comorbidities  Comorbidity 1    Comorbidities  Lumbar surgery x 2, DM, left shoulder surgery.    Examination-Activity Limitations  Locomotion Level;Other    Stability/Clinical Decision Making  Stable/Uncomplicated    Rehab Potential  Excellent    PT Frequency  2x / week    PT Duration  6 weeks    PT Treatment/Interventions  ADLs/Self Care Home Management;Cryotherapy;Electrical Stimulation;Gait training;Ultrasound;Moist Heat;Iontophoresis 4mg /ml Dexamethasone;Therapeutic exercise;Neuromuscular re-education;Therapeutic activities;Patient/family education;Manual techniques;Passive range of motion;Vasopneumatic Device    PT Next Visit Plan  RT ankle strengthening    Consulted and Agree with Plan of Care  Patient       Patient will benefit from skilled therapeutic intervention in order to improve the following deficits and impairments:  Pain, Decreased activity tolerance, Decreased range of motion  Visit Diagnosis: Pain in right ankle and joints of right foot  Stiffness of right ankle, not elsewhere classified     Problem List Patient Active Problem List   Diagnosis Date Noted  . AMS (altered mental status) 05/19/2019  . Thrombocytopenia ([place]) 05/19/2019  . SBO (small bowel obstruction) s/p lap LOA 03/19/2019 03/19/2019  . Chronic migraine without aura, with  intractable migraine, so stated, with status migrainosus 11/20/2018  . Lumbar radiculopathy 10/11/2018  . Atelectasis   . Hypoglycemia   . Hypotension   . Bradycardia   . Epigastric pain 06/19/2018  . History of adenomatous polyp of colon 10/23/2017  . Hyperlipidemia associated with type 2 diabetes mellitus ([place]) 07/06/2017  . GERD (gastroesophageal reflux disease) 03/21/2017  . History of Roux-en-Y gastric bypass 2018 03/21/2017  . Morbid obesity with BMI of 40.0-44.9, adult ([name]) 06/06/2016  . Intractable chronic migraine without aura 06/04/2015  . Abnormal uterine bleeding 05/13/2015  . Pancreatitis 02/27/2014  . Fatty liver disease, nonalcoholic [id number]  . Bipolar disorder ([place]) 02/20/2014  . Metabolic syndrome [id number]  . Vitamin D deficiency   . DM (diabetes mellitus) ([place]) 03/12/2013  . Surgery, elective 06/12/2011  . Hypothyroidism   . Constipation, chronic   . Vitamin B 12 deficiency   . ALLERGIC RHINITIS 12/31/2010  . BARRETTS ESOPHAGUS 12/31/2010  . Gastroparesis 12/31/2010  . Bilateral chronic knee pain 12/31/2010  . Obstructive sleep apnea 09/27/2010  . Migraine 09/27/2010  . RESPIRATORY FAILURE, CHRONIC 09/27/2010  . Cyst of ovary 10/19/2009    Johanthan Kneeland,CHRIS, PTA 01/23/2020, 9:42 AM  [facility] [place], [place], [address] Phone: [phone]   Fax:  [phone]  Name: ADYN ASKINS MRN: [id number] Date of Birth: 1966-07-23

## 2020-01-28 ENCOUNTER — Other Ambulatory Visit: Payer: Self-pay

## 2020-01-28 ENCOUNTER — Ambulatory Visit: Payer: Medicare Other | Admitting: *Deleted

## 2020-01-28 DIAGNOSIS — M25671 Stiffness of right ankle, not elsewhere classified: Secondary | ICD-10-CM | POA: Diagnosis not present

## 2020-01-28 DIAGNOSIS — M25571 Pain in right ankle and joints of right foot: Secondary | ICD-10-CM

## 2020-01-28 NOTE — Therapy (Signed)
[facility] [place], [place], [address] Phone: [phone]   Fax:  [phone]  Physical Therapy Treatment  Patient Details  Name: Morgan [name] MRN: [id number] Date of Birth: 10-14-1966 Referring Provider (PT): Radene Journey MD   Encounter Date: 01/28/2020  PT End of Session - 01/28/20 0814    Visit Number  10    Number of Visits  12    Date for PT Re-Evaluation  02/06/20    Authorization Type  FOTO AT LEAST EVERY 5TH VISIT.  PROGRESS NOTE AT 10TH VISIT.     10th visit Pt feels 70% better    PT Start Time  0815    PT Stop Time  0905    PT Time Calculation (min)  50 min       Past Medical History:  Diagnosis Date  . Allergic rhinitis   . Anemia     after gastric bypass in 2018  . Anxiety   . Barrett's esophagus   . Bipolar affective disorder ([place])   . CAP (community acquired pneumonia) 07/20/2018  . Chronic respiratory failure ([place])   . Complication of anesthesia    "they had a hard time keeping my blood sugar up after back surgery in April 2020" per pt  . Constipation, chronic   . Degenerative joint disease of spine   . Depression   . Diabetes mellitus without complication ([place])    type 2   . DM (diabetes mellitus) ([place])    "hypoglycemic" per pt - no longer takes DM meds due to weight loss from gastric bypass in 2018  . Dry eye   . Family history of adverse reaction to anesthesia    nausea and vomiting  . Gastroparesis   . GERD (gastroesophageal reflux disease)   . History of bariatric surgery 03/2017  . History of hiatal hernia   . HLD (hyperlipidemia) 03/12/2013  . Hyperlipidemia   . Hypertension    No HTN meds since weight loss from Gastric Bypass in 2018  . Intractable chronic migraine without aura 06/04/2015  . Migraine headache   . Morbid obesity ([place])   . OSA (obstructive sleep apnea)    No cpap since gastric surgery  . Osteoarthritis    bilateral knee  . SBO (small bowel obstruction) ([place])  03/19/2019  . Sleep apnea   . Unspecified hypothyroidism   . Vitamin B 12 deficiency   . Vitamin D deficiency     Past Surgical History:  Procedure Laterality Date  . ANKLE ARTHROSCOPY WITH RECONSTRUCTION Right 09/24/2019   Procedure: ANKLE ARTHROSCOPY DEBRIDEMENT TREATMENT OF OSTEOCHONDRAL LESION TALUS. PRONEAL TENDON DEBRIDEMENT;  Surgeon: Erle Crocker, MD;  Location: [facility];  Service: Orthopedics;  Laterality: Right;  SURGERY REQUEST TIME 2 HOURS  . APPENDECTOMY  1978  . CHOLECYSTECTOMY  2005  . COLONOSCOPY    . disc repair  05/17/2019   with rods, lumbar spine  . GASTRIC ROUX-EN-Y N/A 03/21/2017   Procedure: LAPAROSCOPIC ROUX-EN-Y GASTRIC, UPPER ENDO;  Surgeon: Greer Pickerel, MD;  [facility];  Service: General;  Laterality: N/A;  . GASTROSTOMY N/A 06/19/2018   Procedure: LAPRASCOPIC INSERTION OF GASTROSTOMY TUBE;  Surgeon: Greer Pickerel, MD;  [facility];  Service: General;  Laterality: N/A;  . GASTROSTOMY TUBE PLACEMENT Left    06/2018  . HERNIA REPAIR    . HIATAL HERNIA REPAIR N/A 06/19/2018   Procedure: LAPAROSCOPIC REPAIR OF HIATAL HERNIA;  Surgeon: Greer Pickerel, MD;  [facility];  Service: General;  Laterality: N/A;  . LAPAROSCOPIC LYSIS OF ADHESIONS  03/19/2019   Dr. Romana Juniper  . LAPAROSCOPY N/A 06/19/2018   Procedure: LAPAROSCOPY DIAGNOSTIC;  Surgeon: Greer Pickerel, MD;  [facility];  Service: General;  Laterality: N/A;  . LAPAROSCOPY N/A 03/19/2019   Procedure: LAPAROSCOPY DIAGNOSTIC lysis of adhesions;  Surgeon: Clovis Riley, MD;  [facility];  Service: General;  Laterality: N/A;  . LUMBAR LAMINECTOMY/DECOMPRESSION MICRODISCECTOMY Right 10/11/2018   Procedure: LAMINECTOMY AND FORAMINOTOMY RIGHT LUMBAR FOUR- LUMBAR FIVE;  Surgeon: Consuella Lose, MD;  Location: [facility];  Service: Neurosurgery;  Laterality: Right;  . SHOULDER ARTHROSCOPY  7/12   left-dsc  . TONSILLECTOMY  at age 53  . TRIGGER FINGER RELEASE  12/20/2012    Procedure: RELEASE TRIGGER FINGER/A-1 PULLEY;  Surgeon: Tennis Must, MD;  Location: [facility];  Service: Orthopedics;  Laterality: Left;  LEFT TRIGGER THUMB RELEASE    There were no vitals filed for this visit.  Subjective Assessment - 01/28/20 0820    Subjective  COVID-19 screen performed prior to patient entering clinic. RT ankle sore after last Rx, but better today. no pain just sore and tight. 70 % better since starting PT.    Pertinent History  Lumbar surgery x 2, DM, left shoulder surgery.    Patient Stated Goals  Get out of boot and walk without pain.    Currently in Pain?  No/denies    Pain Location  Ankle    Pain Orientation  Right    Pain Descriptors / Indicators  Sore;Tightness    Pain Type  Surgical pain    Pain Onset  More than a month ago                       [facility] Adult PT Treatment/Exercise - 01/28/20 0001      Exercises   Exercises  Ankle      Modalities   Modalities  Electrical Stimulation;Iontophoresis;Moist Heat      Vasopneumatic   Number Minutes Vasopneumatic   15 minutes    Vasopnuematic Location   --   Right ankle.   Vasopneumatic Pressure  Low    Vasopneumatic Temperature   36      Ankle Exercises: Aerobic   Recumbent Bike  L1 x 10 mins      Ankle Exercises: Standing   SLS  RT LE  x 5 max 15 sec hold today    Rocker Board  3 minutes;2 minutes    Other Standing Ankle Exercises  dyna-disc x 3 mins PF/DF and CW/CCW      Ankle Exercises: Seated   Other Seated Ankle Exercises  Ankle isolator   # at end x 3 mins DF, CW/CCW motions      Ankle Exercises: Sidelying   Ankle Eversion Weights (lbs)  ankle isolator 1#  x 3 mins                  PT Long Term Goals - 01/28/20 0849      PT LONG TERM GOAL #1   Title  Independent with a HEP.    Time  6    Period  Weeks    Status  Achieved      PT LONG TERM GOAL #2   Title  Increase right ankle dorsiflexion to 8-10 degrees to normalize the patient's gait  pattern.    Time  6    Period  Weeks    Status  Achieved      PT LONG TERM GOAL #3   Title  Increase ankle strength to 4+ to 5/5 to increase stability for functional tasks.    Baseline  4+/5    Time  6    Period  Weeks    Status  Achieved      PT LONG TERM GOAL #4   Title  Walk in clinic without brace and no deviations.    Period  Weeks    Status  Achieved            Plan - 01/28/20 0910    Clinical Impression Statement  Pt arrived today doing very well and reports DC to HEP after today's session. Pt was guided through balance and strengthening for RT ankle and is independent in HEP. All LTGs met. DC to HEP. Pt feels that her ankle is about 70% better.    Personal Factors and Comorbidities  Comorbidity 1    Comorbidities  Lumbar surgery x 2, DM, left shoulder surgery.    Examination-Activity Limitations  Locomotion Level;Other    Stability/Clinical Decision Making  Stable/Uncomplicated    Rehab Potential  Excellent    PT Frequency  2x / week    PT Duration  6 weeks    PT Treatment/Interventions  ADLs/Self Care Home Management;Cryotherapy;Electrical Stimulation;Gait training;Ultrasound;Moist Heat;Iontophoresis 37m/ml Dexamethasone;Therapeutic exercise;Neuromuscular re-education;Therapeutic activities;Patient/family education;Manual techniques;Passive range of motion;Vasopneumatic Device    PT Next Visit Plan  DC to HEP. All LTGs met       Patient will benefit from skilled therapeutic intervention in order to improve the following deficits and impairments:  Pain, Decreased activity tolerance, Decreased range of motion  Visit Diagnosis: Pain in right ankle and joints of right foot  Stiffness of right ankle, not elsewhere classified     Problem List Patient Active Problem List   Diagnosis Date Noted  . AMS (altered mental status) 05/19/2019  . Thrombocytopenia ([place] 05/19/2019  . SBO (small bowel obstruction) s/p lap LOA 03/19/2019 03/19/2019  . Chronic migraine  without aura, with intractable migraine, so stated, with status migrainosus 11/20/2018  . Lumbar radiculopathy 10/11/2018  . Atelectasis   . Hypoglycemia   . Hypotension   . Bradycardia   . Epigastric pain 06/19/2018  . History of adenomatous polyp of colon 10/23/2017  . Hyperlipidemia associated with type 2 diabetes mellitus ([place] 07/06/2017  . GERD (gastroesophageal reflux disease) 03/21/2017  . History of Roux-en-Y gastric bypass 2018 03/21/2017  . Morbid obesity with BMI of 40.0-44.9, adult ([place] 06/06/2016  . Intractable chronic migraine without aura 06/04/2015  . Abnormal uterine bleeding 05/13/2015  . Pancreatitis 02/27/2014  . Fatty liver disease, nonalcoholic 009/32/3557 . Bipolar disorder ([place] 02/20/2014  . Metabolic syndrome 032/20/2542 . Vitamin D deficiency   . DM (diabetes mellitus) ([place] 03/12/2013  . Surgery, elective 06/12/2011  . Hypothyroidism   . Constipation, chronic   . Vitamin B 12 deficiency   . ALLERGIC RHINITIS 12/31/2010  . BARRETTS ESOPHAGUS 12/31/2010  . Gastroparesis 12/31/2010  . Bilateral chronic knee pain 12/31/2010  . Obstructive sleep apnea 09/27/2010  . Migraine 09/27/2010  . RESPIRATORY FAILURE, CHRONIC 09/27/2010  . Cyst of ovary 10/19/2009    ,CHRIS, PTA 01/28/2020, 9:29 AM  [facility] [address]Phone: [phone]  Fax:  [phone] Name: Morgan OYERMRN: [id number] of Birth: 8Oct 09, 1967 PHYSICAL THERAPY DISCHARGE SUMMARY  Visits from Start of Care: 10.  Current functional level related  to goals / functional outcomes: See above.   Remaining deficits: All goals met.   Education / Equipment: HEP. Plan: Patient agrees to discharge.  Patient goals were met. Patient is being discharged due to meeting the stated rehab goals.  ?????         Mali Applegate MPT

## 2020-01-31 ENCOUNTER — Encounter: Payer: Medicare Other | Admitting: Physical Therapy

## 2020-02-04 ENCOUNTER — Other Ambulatory Visit: Payer: Self-pay | Admitting: Neurology

## 2020-02-04 DIAGNOSIS — M542 Cervicalgia: Secondary | ICD-10-CM | POA: Diagnosis not present

## 2020-02-05 ENCOUNTER — Telehealth: Payer: Self-pay | Admitting: Neurology

## 2020-02-05 NOTE — Telephone Encounter (Signed)
[id number] [facility] - has called asking if a refill would be done on pt's ondansetron (ZOFRAN) 4 MG tablet.  Phone rep reached out to Amy,NP and Sandy,RN.  Amy stated that 1 more refill would be called in but stated pt needs to come in for an appointment(pt has a f/u scheduled for 04-14-20 )

## 2020-02-06 ENCOUNTER — Other Ambulatory Visit: Payer: Self-pay | Admitting: Family Medicine

## 2020-02-06 DIAGNOSIS — K219 Gastro-esophageal reflux disease without esophagitis: Secondary | ICD-10-CM

## 2020-02-06 NOTE — Telephone Encounter (Signed)
Can you make sure this was taken care of. I approved 1 additional 90 tablet refill. She should not use this medication every day. Only as needed.

## 2020-02-06 NOTE — Telephone Encounter (Addendum)
I called pt and relayed that AL/NP ok'd 90 tablets fpr her at [facility]. This was called into pharmacy and it is ready for pt.   She had not gotten call.  I relayed that she is to take only as needed for N/V.  She states she make take 3 days out of 7 (once daily).  I relayed if she is taking so frequently that we may need to adjust medications for her.  She stated that she just started taking emgality and will see how it goes.  Since on ajovy previously was not need to do emgality loading dose.

## 2020-02-06 NOTE — Telephone Encounter (Signed)
Ov 02/17/20

## 2020-02-11 ENCOUNTER — Other Ambulatory Visit: Payer: Self-pay | Admitting: Family Medicine

## 2020-02-13 DIAGNOSIS — R109 Unspecified abdominal pain: Secondary | ICD-10-CM | POA: Diagnosis not present

## 2020-02-13 DIAGNOSIS — K219 Gastro-esophageal reflux disease without esophagitis: Secondary | ICD-10-CM | POA: Diagnosis not present

## 2020-02-13 DIAGNOSIS — Z9884 Bariatric surgery status: Secondary | ICD-10-CM | POA: Diagnosis not present

## 2020-02-13 DIAGNOSIS — K5909 Other constipation: Secondary | ICD-10-CM | POA: Diagnosis not present

## 2020-02-14 ENCOUNTER — Other Ambulatory Visit: Payer: Self-pay

## 2020-02-17 ENCOUNTER — Encounter: Payer: Self-pay | Admitting: Family Medicine

## 2020-02-17 ENCOUNTER — Other Ambulatory Visit: Payer: Self-pay

## 2020-02-17 ENCOUNTER — Ambulatory Visit (INDEPENDENT_AMBULATORY_CARE_PROVIDER_SITE_OTHER): Payer: Medicare Other

## 2020-02-17 ENCOUNTER — Ambulatory Visit (INDEPENDENT_AMBULATORY_CARE_PROVIDER_SITE_OTHER): Payer: Medicare Other | Admitting: Family Medicine

## 2020-02-17 VITALS — BP 124/74 | HR 58 | Temp 97.7°F | Wt 164.0 lb

## 2020-02-17 DIAGNOSIS — E119 Type 2 diabetes mellitus without complications: Secondary | ICD-10-CM

## 2020-02-17 DIAGNOSIS — Z9884 Bariatric surgery status: Secondary | ICD-10-CM

## 2020-02-17 DIAGNOSIS — E538 Deficiency of other specified B group vitamins: Secondary | ICD-10-CM

## 2020-02-17 DIAGNOSIS — Z780 Asymptomatic menopausal state: Secondary | ICD-10-CM | POA: Diagnosis not present

## 2020-02-17 DIAGNOSIS — M85851 Other specified disorders of bone density and structure, right thigh: Secondary | ICD-10-CM | POA: Diagnosis not present

## 2020-02-17 DIAGNOSIS — E039 Hypothyroidism, unspecified: Secondary | ICD-10-CM | POA: Diagnosis not present

## 2020-02-17 NOTE — Patient Instructions (Signed)
Bone Density Test The bone density test uses a special type of X-ray to measure the amount of calcium and other minerals in your bones. It can measure bone density in the hip and the spine. The test procedure is similar to having a regular X-ray. This test may also be called:  Bone densitometry.  Bone mineral density test.  Dual-energy X-ray absorptiometry (DEXA). You may have this test to:  Diagnose a condition that causes weak or thin bones (osteoporosis).  Screen you for osteoporosis.  Predict your risk for a broken bone (fracture).  Determine how well your osteoporosis treatment is working. Tell a health care provider about:  Any allergies you have.  All medicines you are taking, including vitamins, herbs, eye drops, creams, and over-the-counter medicines.  Any problems you or family members have had with anesthetic medicines.  Any blood disorders you have.  Any surgeries you have had.  Any medical conditions you have.  Whether you are pregnant or may be pregnant.  Any medical tests you have had within the past 14 days that used contrast material. What are the risks? Generally, this is a safe procedure. However, it does expose you to a small amount of radiation, which can slightly increase your cancer risk. What happens before the procedure?  Do not take any calcium supplements starting 24 hours before your test.  Remove all metal jewelry, eyeglasses, dental appliances, and any other metal objects. What happens during the procedure?   You will lie down on an exam table. There will be an X-ray generator below you and an imaging device above you.  Other devices, such as boxes or braces, may be used to position your body properly for the scan.  The machine will slowly scan your body. You will need to keep still.  The images will show up on a screen in the room. Images will be examined by a specialist after your test is done. The procedure may vary among health care  providers and hospitals. What happens after the procedure?  It is up to you to get your test results. Ask your health care provider, or the department that is doing the test, when your results will be ready. Summary  A bone density test is an imaging test that uses a type of X-ray to measure the amount of calcium and other minerals in your bones.  The test may be used to diagnose or screen you for a condition that causes weak or thin bones (osteoporosis), predict your risk for a broken bone (fracture), or determine how well your osteoporosis treatment is working.  Do not take any calcium supplements starting 24 hours before your test.  Ask your health care provider, or the department that is doing the test, when your results will be ready. This information is not intended to replace advice given to you by your health care provider. Make sure you discuss any questions you have with your health care provider. Document Revised: 12/14/2017 Document Reviewed: 10/02/2017 Elsevier Patient Education  2020 [employer]  

## 2020-02-17 NOTE — Progress Notes (Signed)
Subjective: CC: hypothyroidism, DM, HTN PCP: Morgan Norlander, DO EB:[id number] Morgan [name] is a 54 y.o. female presenting to clinic today for:  1. Acquired hypothyroidism Patient reports levothyroxine.  Denies any change in voice, difficulty swallowing, tremor, change in weight or bowel habits.  2. Diet controlled type 2 Diabetes w/ hx HTN and HLD; history of gastric bypass She brings in her blood sugar log today.  She had 2 hypoglycemic episodes since January, one that was in the 5s and the other in the 60s.  Other blood sugars have been typically maintained well into the 80s to low 100s.  She continues to eat scheduled meals and has been avoiding cereal since it seems to precipitate hypoglycemic episodes.  A home health nurse from her insurance company came by and saw her recently.  They collected an A1c at that time which was 5.0.  Urine microalbumin was negative.  She has not yet scheduled her diabetic eye exam but will get done later in June.  She is now 3 years out from her gastric bypass.  November 2018 was also the last time she had a menstrual cycle.  She would like to go ahead and proceed with her bone density scan if we can get that done today.  She continues supplements as directed.  Last eye exam: Had eye exam done in June Last foot exam: 08/2019 Last A1c:  Lab Results  Component Value Date   HGBA1C 5.4 11/19/2019   Nephropathy screen indicated?:  Up-to-date Last flu, zoster and/or pneumovax: Up-to-date Immunization History  Administered Date(s) Administered  . Influenza,inj,Quad PF,6+ Mos 09/29/2016, 09/07/2017, 09/11/2018  . Influenza,inj,quad, With Preservative 09/17/2019  . Pneumococcal Polysaccharide-23 06/29/2016  . Td 07/09/2015  . Tdap 07/09/2015  . Zoster Recombinat (Shingrix) 10/06/2017, 01/04/2018    ROS: No chest pain, shortness of breath, dizziness, falls.  3. Vitamin B12 deficiency Patient had her last B12 injection 3 months ago.  She denies any  balance issues, difficulty with proprioception or sensation changes in the lower extremity.   ROS: Per HPI  Allergies  Allergen Reactions  . Ketoconazole Hives and Swelling    SWELLING REACTION UNSPECIFIED   . Pravachol [Pravastatin Sodium] Shortness Of Breath, Swelling and Anaphylaxis    Throat swelling  . Statins Other (See Comments)    Leg cramps  . Tape Dermatitis and Other (See Comments)    Steri-Strips  . Codeine Hypertension    increased BP   Past Medical History:  Diagnosis Date  . Allergic rhinitis   . Anemia     after gastric bypass in 2018  . Anxiety   . Barrett's esophagus   . Bipolar affective disorder ([place])   . CAP (community acquired pneumonia) 07/20/2018  . Chronic respiratory failure ([place])   . Complication of anesthesia    "they had a hard time keeping my blood sugar up after back surgery in April 2020" per pt  . Constipation, chronic   . Degenerative joint disease of spine   . Depression   . Diabetes mellitus without complication ([place])    type 2   . DM (diabetes mellitus) ([place])    "hypoglycemic" per pt - no longer takes DM meds due to weight loss from gastric bypass in 2018  . Dry eye   . Family history of adverse reaction to anesthesia    nausea and vomiting  . Gastroparesis   . GERD (gastroesophageal reflux disease)   . History of bariatric surgery 03/2017  . History of hiatal  hernia   . HLD (hyperlipidemia) 03/12/2013  . Hyperlipidemia   . Hypertension    No HTN meds since weight loss from Gastric Bypass in 2018  . Intractable chronic migraine without aura 06/04/2015  . Migraine headache   . Morbid obesity ([place])   . OSA (obstructive sleep apnea)    No cpap since gastric surgery  . Osteoarthritis    bilateral knee  . SBO (small bowel obstruction) ([place]) 03/19/2019  . Sleep apnea   . Unspecified hypothyroidism   . Vitamin B 12 deficiency   . Vitamin D deficiency     Current Outpatient Medications:  .  acetaminophen (TYLENOL) 500 MG tablet,  Take 1,000 mg by mouth every 4 (four) hours as needed for moderate pain., Disp: , Rfl:  .  azelastine (ASTELIN) 0.1 % nasal spray, Place 1 spray into both nostrils 2 (two) times daily., Disp: 30 mL, Rfl: 12 .  Calcium-Vitamin D-Vitamin K 650-12.5-40 MG-MCG-MCG CHEW, Chew 1 tablet by mouth 2 (two) times daily., Disp: , Rfl:  .  cyclobenzaprine (FLEXERIL) 10 MG tablet, , Disp: , Rfl:  .  dicyclomine (BENTYL) 20 MG tablet, Take 1 tablet every 8 (eight) hours as needed for spasms. (for abdominal cramping), Disp: 90 tablet, Rfl: 0 .  divalproex (DEPAKOTE) 500 MG DR tablet, TAKE 1 TABLET EVERY MORNING AND 2 TABLETS AT BEDTIME, Disp: 90 tablet, Rfl: 5 .  docusate sodium (COLACE) 100 MG capsule, Take 1 capsule (100 mg total) by mouth 2 (two) times daily., Disp: 30 capsule, Rfl: 0 .  fluticasone (FLONASE) 50 MCG/ACT nasal spray, Place 2 sprays into both nostrils 2 (two) times daily as needed for allergies., Disp: 16 g, Rfl: 4 .  gabapentin (NEURONTIN) 300 MG capsule, TAKE 2 CAPSULES IN THE MORNING, 1 CAPSULE AT LUNCH AND 2 CAPSULES AT BEDTIME, Disp: 450 capsule, Rfl: 0 .  Galcanezumab-gnlm (EMGALITY) 120 MG/ML SOAJ, Inject 120 mg into the skin every 30 (thirty) days., Disp: 1 pen, Rfl: 11 .  Glucagon, rDNA, (GLUCAGON EMERGENCY IJ), Inject 1 Syringe as directed as needed (emergency low blood sugar)., Disp: , Rfl:  .  glucose blood (ONE TOUCH ULTRA TEST) test strip, CHECK BLOOD SUGAR FOUR TIMES A DAY.  Dx E16.2 E11.649, Disp: 400 each, Rfl: 3 .  levothyroxine (SYNTHROID) 112 MCG tablet, TAKE 1 TABLET BEFORE BREAKFAST, Disp: 90 tablet, Rfl: 1 .  LINZESS 145 MCG CAPS capsule, , Disp: , Rfl:  .  loratadine (CLARITIN) 10 MG tablet, Take 10 mg by mouth daily as needed for allergies. , Disp: , Rfl:  .  Multiple Vitamin (MULTIVITAMIN) capsule, Take 1 capsule by mouth daily. , Disp: , Rfl:  .  nystatin (MYCOSTATIN/NYSTOP) powder, Use twice daily prn groin rash. x7-10d per flare.  Please make sure she gets POWDER, Disp:  30 g, Rfl: 1 .  ondansetron (ZOFRAN) 4 MG tablet, TAKE  (1)  TABLET  EVERY EIGHT HOURS AS NEEDED., Disp: 90 tablet, Rfl: 0 .  pantoprazole (PROTONIX) 40 MG tablet, Take 1 tablet (40 mg total) by mouth daily., Disp: 90 tablet, Rfl: 0 .  PAZEO 0.7 % SOLN, Place 1 drop into both eyes every morning., Disp: , Rfl:  .  polyethylene glycol (MIRALAX / GLYCOLAX) packet, Take 17 g by mouth at bedtime. , Disp: , Rfl:  .  Probiotic Product (PROBIOTIC DAILY PO), Take 1 capsule by mouth daily., Disp: , Rfl:  .  RESTASIS MULTIDOSE 0.05 % ophthalmic emulsion, Place 1 drop into both eyes 2 (two) times daily., Disp: ,  Rfl:  .  sertraline (ZOLOFT) 100 MG tablet, Take 2 tablets (200 mg total) by mouth daily., Disp: 180 tablet, Rfl: 3 .  tiZANidine (ZANAFLEX) 2 MG tablet, Take 1 tablet (2 mg total) by mouth every 8 (eight) hours as needed for muscle spasms., Disp: 60 tablet, Rfl: 6 .  ziprasidone (GEODON) 60 MG capsule, Take 1 capsule (60 mg total) by mouth at bedtime., Disp: 90 capsule, Rfl: 1  Current Facility-Administered Medications:  .  cyanocobalamin ((VITAMIN B-12)) injection 1,000 mcg, 1,000 mcg, Intramuscular, Q30 days, Arshia Spellman M, DO, 1,000 mcg at 11/19/19 1032   Social History   Socioeconomic History  . Marital status: Single    Spouse name: Not on file  . Number of children: 0  . Years of education: HS  . Highest education level: 12th grade  Occupational History  . Occupation: disabled    Fish farm manager: UNEMPLOYED  Tobacco Use  . Smoking status: Never Smoker  . Smokeless tobacco: Never Used  Substance and Sexual Activity  . Alcohol use: No  . Drug use: No  . Sexual activity: Never    Birth control/protection: Post-menopausal    Comment: LMP 10/2017  Other Topics Concern  . Not on file  Social History Narrative   Patient is right handed.   Patient drinks 2 glasses of caffeine daily.   Lives at home. Her niece is living with her right now.   Social Determinants of Health    Financial Resource Strain:   . Difficulty of Paying Living Expenses: Not on file  Food Insecurity:   . Worried About Charity fundraiser in the Last Year: Not on file  . Ran Out of Food in the Last Year: Not on file  Transportation Needs:   . Lack of Transportation (Medical): Not on file  . Lack of Transportation (Non-Medical): Not on file  Physical Activity:   . Days of Exercise per Week: Not on file  . Minutes of Exercise per Session: Not on file  Stress:   . Feeling of Stress : Not on file  Social Connections:   . Frequency of Communication with Friends and Family: Not on file  . Frequency of Social Gatherings with Friends and Family: Not on file  . Attends Religious Services: Not on file  . Active Member of Clubs or Organizations: Not on file  . Attends Archivist Meetings: Not on file  . Marital Status: Not on file  Intimate Partner Violence:   . Fear of Current or Ex-Partner: Not on file  . Emotionally Abused: Not on file  . Physically Abused: Not on file  . Sexually Abused: Not on file   Family History  Problem Relation Age of Onset  . Asthma Father   . Allergies Father   . Heart disease Father        enlarged heart  . Peripheral vascular disease Father   . Diabetes Father   . Hyperlipidemia Father   . Arthritis Father   . Asthma Sister   . Cancer Sister        colon at 53 yr old.  . Colon cancer Sister   . Allergies Mother   . Stroke Mother 38       with hemi paralysis  . Diabetes Mother   . Hyperlipidemia Mother   . Hypertension Mother   . GI problems Mother   . Arthritis Mother   . Allergies Brother   . Early death Brother 26  congenital abormality  . Allergies Sister   . Diabetes Sister   . Asthma Sister   . Colon polyps Sister   . Hyperlipidemia Sister   . GI problems Sister        gastroporesis   . Liver disease Sister        fatty liver  . Stroke Sister        intercrandial bleed  . Diabetes Brother   . Hypertension  Brother   . Hyperlipidemia Brother   . Heart disease Paternal Aunt   . Migraines Neg Hx     Objective: Office vital signs reviewed. BP 124/74   Pulse (!) 58   Temp 97.7 F (36.5 C)   Wt 164 lb (74.4 kg)   LMP 10/17/2017 (Approximate)   SpO2 97%   BMI 28.15 kg/m   Physical Examination:  General: Awake, alert, well appearing. No acute distress HEENT: Normal, sclera white.  MMM Cardio: regular rate and rhythm, S1S2 heard, no murmurs appreciated Pulm: clear to auscultation bilaterally, no wheezes, rhonchi or rales; normal work of breathing on room air Extremities: warm, well perfused, No edema, cyanosis or clubbing; +2 pulses bilaterally Skin: Normal temperature Neuro: No tremor  Assessment/ Plan: 54 y.o. female   1. Acquired hypothyroidism Asymptomatic.  Check thyroid panel - Thyroid Panel With TSH  2. Vitamin B12 deficiency Asymptomatic check vitamin B-12.  Vitamin B12 injection was administered after the lab collection.  We will plan to space out the B12 injections accordingly pending what her level is today - Vitamin B12  3. Diet-controlled diabetes mellitus (HCC) A1c well controlled at 5.0.  This was not repeated given that it was just done 2 weeks ago.  4. History of Roux-en-Y gastric bypass 2018 Bone density scan ordered - DG [facility] DEXA  5. Asymptomatic postmenopausal estrogen deficiency - DG [facility] DEXA   Orders Placed This Encounter  Procedures  . Bayer DCA Hb A1c Waived  . Thyroid Panel With TSH    No orders of the defined types were placed in this encounter.   Morgan Norlander, DO [place] [phone]

## 2020-02-18 LAB — THYROID PANEL WITH TSH
Free Thyroxine Index: 1.5 (ref 1.2–4.9)
T3 Uptake Ratio: 24 % (ref 24–39)
T4, Total: 6.3 ug/dL (ref 4.5–12.0)
TSH: 3.94 u[IU]/mL (ref 0.450–4.500)

## 2020-02-18 LAB — VITAMIN B12: Vitamin B-12: 1347 pg/mL — ABNORMAL HIGH (ref 232–1245)

## 2020-02-19 DIAGNOSIS — M542 Cervicalgia: Secondary | ICD-10-CM | POA: Diagnosis not present

## 2020-02-21 ENCOUNTER — Encounter: Payer: Self-pay | Admitting: Physical Therapy

## 2020-02-21 ENCOUNTER — Other Ambulatory Visit: Payer: Self-pay

## 2020-02-21 ENCOUNTER — Ambulatory Visit: Payer: Medicare Other | Attending: Orthopedic Surgery | Admitting: Physical Therapy

## 2020-02-21 DIAGNOSIS — M542 Cervicalgia: Secondary | ICD-10-CM | POA: Diagnosis not present

## 2020-02-21 DIAGNOSIS — G8929 Other chronic pain: Secondary | ICD-10-CM | POA: Insufficient documentation

## 2020-02-21 DIAGNOSIS — M25512 Pain in left shoulder: Secondary | ICD-10-CM | POA: Insufficient documentation

## 2020-02-21 DIAGNOSIS — M6281 Muscle weakness (generalized): Secondary | ICD-10-CM | POA: Diagnosis not present

## 2020-02-21 NOTE — Therapy (Signed)
[facility] [place], [place], [address] Phone: [phone]   Fax:  [phone]  Physical Therapy Evaluation  Patient Details  Name: Morgan [name] MRN: [id number] Date of Birth: 05-16-1966 Referring Provider (PT): Marchia Bond MD   Encounter Date: 02/21/2020  PT End of Session - 02/21/20 0906    Visit Number  1    Number of Visits  13    Authorization Type  FOTO every 10th visit, PN every 10th visit.    PT Start Time  0900    PT Stop Time  0945    PT Time Calculation (min)  45 min    Activity Tolerance  Patient tolerated treatment well    Behavior During Therapy  WFL for tasks assessed/performed       Past Medical History:  Diagnosis Date  . Allergic rhinitis   . Anemia     after gastric bypass in 2018  . Anxiety   . Barrett's esophagus   . Bipolar affective disorder ([place])   . CAP (community acquired pneumonia) 07/20/2018  . Chronic respiratory failure ([place])   . Complication of anesthesia    "they had a hard time keeping my blood sugar up after back surgery in April 2020" per pt  . Constipation, chronic   . Degenerative joint disease of spine   . Depression   . Diabetes mellitus without complication ([place])    type 2   . DM (diabetes mellitus) ([place])    "hypoglycemic" per pt - no longer takes DM meds due to weight loss from gastric bypass in 2018  . Dry eye   . Family history of adverse reaction to anesthesia    nausea and vomiting  . Gastroparesis   . GERD (gastroesophageal reflux disease)   . History of bariatric surgery 03/2017  . History of hiatal hernia   . HLD (hyperlipidemia) 03/12/2013  . Hyperlipidemia   . Hypertension    No HTN meds since weight loss from Gastric Bypass in 2018  . Intractable chronic migraine without aura 06/04/2015  . Migraine headache   . Morbid obesity ([place])   . OSA (obstructive sleep apnea)    No cpap since gastric surgery  . Osteoarthritis    bilateral knee  . SBO (small  bowel obstruction) ([place]) 03/19/2019  . Sleep apnea   . Unspecified hypothyroidism   . Vitamin B 12 deficiency   . Vitamin D deficiency     Past Surgical History:  Procedure Laterality Date  . ANKLE ARTHROSCOPY WITH RECONSTRUCTION Right 09/24/2019   Procedure: ANKLE ARTHROSCOPY DEBRIDEMENT TREATMENT OF OSTEOCHONDRAL LESION TALUS. PRONEAL TENDON DEBRIDEMENT;  Surgeon: Erle Crocker, MD;  Location: [facility];  Service: Orthopedics;  Laterality: Right;  SURGERY REQUEST TIME 2 HOURS  . APPENDECTOMY  1978  . CHOLECYSTECTOMY  2005  . COLONOSCOPY    . disc repair  05/17/2019   with rods, lumbar spine  . GASTRIC ROUX-EN-Y N/A 03/21/2017   Procedure: LAPAROSCOPIC ROUX-EN-Y GASTRIC, UPPER ENDO;  Surgeon: Greer Pickerel, MD;  Location: [facility];  Service: General;  Laterality: N/A;  . GASTROSTOMY N/A 06/19/2018   Procedure: LAPRASCOPIC INSERTION OF GASTROSTOMY TUBE;  Surgeon: Greer Pickerel, MD;  Location: [facility];  Service: General;  Laterality: N/A;  . GASTROSTOMY TUBE PLACEMENT Left    06/2018  . HERNIA REPAIR    . HIATAL HERNIA REPAIR N/A 06/19/2018   Procedure: LAPAROSCOPIC REPAIR OF HIATAL HERNIA;  Surgeon: Greer Pickerel, MD;  Location: [facility];  Service:  General;  Laterality: N/A;  . LAPAROSCOPIC LYSIS OF ADHESIONS  03/19/2019   Dr. Romana Juniper  . LAPAROSCOPY N/A 06/19/2018   Procedure: LAPAROSCOPY DIAGNOSTIC;  Surgeon: Greer Pickerel, MD;  Location: [facility];  Service: General;  Laterality: N/A;  . LAPAROSCOPY N/A 03/19/2019   Procedure: LAPAROSCOPY DIAGNOSTIC lysis of adhesions;  Surgeon: Clovis Riley, MD;  Location: [facility];  Service: General;  Laterality: N/A;  . LUMBAR LAMINECTOMY/DECOMPRESSION MICRODISCECTOMY Right 10/11/2018   Procedure: LAMINECTOMY AND FORAMINOTOMY RIGHT LUMBAR FOUR- LUMBAR FIVE;  Surgeon: Consuella Lose, MD;  Location: [facility];  Service: Neurosurgery;  Laterality: Right;  . SHOULDER ARTHROSCOPY  7/12   left-dsc  . TONSILLECTOMY  at age 38  . TRIGGER  FINGER RELEASE  12/20/2012   Procedure: RELEASE TRIGGER FINGER/A-1 PULLEY;  Surgeon: Tennis Must, MD;  Location: [facility];  Service: Orthopedics;  Laterality: Left;  LEFT TRIGGER THUMB RELEASE    There were no vitals filed for this visit.   Subjective Assessment - 02/21/20 0859    Subjective  COVID-19 screen performed prior to patient entering clinic.  Pt arriving to therapy for PT evaluation of cervical pain which began in December. Pt just completed therapy for a R ankle. Pt  reporting pain at 8/10 in lower cervical and upper thoracic spine with muscle spasms in L bicep.    Pertinent History  Lumbar surgery x 2, DM, left shoulder surgery.    Patient Stated Goals  Stop hurting, driving without pain, ADL's without pain    Currently in Pain?  Yes    Pain Score  8     Pain Location  Neck    Pain Orientation  Lower    Pain Descriptors / Indicators  Aching;Sore    Pain Type  Chronic pain    Pain Onset  More than a month ago    Aggravating Factors   reaching, lifting, driving, sitting for long periods    Pain Relieving Factors  lying down    Effect of Pain on Daily Activities  difficulty doing laundry, driving, sitting, doing house hold chores         [facility] PT Assessment - 02/21/20 0001      Assessment   Medical Diagnosis  cervicalgia    Referring Provider (PT)  Marchia Bond MD    Onset Date/Surgical Date  --   L shoulder surgery years ago   Hand Dominance  Right    Prior Therapy  yes for ankle, just finished therapy       Precautions   Precautions  None      Restrictions   Weight Bearing Restrictions  No      Balance Screen   Has the patient fallen in the past 6 months  No    Is the patient reluctant to leave their home because of a fear of falling?   No      Home Environment   Living Environment  Private residence    Living Arrangements  Other relatives    Available Help at Discharge  Family    Type of [place] entrance       Prior Function   Level of [place]  On disability      Cognition   Overall Cognitive Status  Within Functional Limits for tasks assessed      Observation/Other Assessments   Focus on Therapeutic Outcomes (FOTO)   % limitation  Posture/Postural Control   Posture/Postural Control  Postural limitations    Postural Limitations  Rounded Shoulders;Forward head;Decreased lumbar lordosis;Increased thoracic kyphosis      ROM / Strength   AROM / PROM / Strength  AROM;Strength      AROM   AROM Assessment Site  Shoulder;Cervical    Right/Left Shoulder  Right;Left    Right Shoulder Extension  40 Degrees    Right Shoulder Flexion  158 Degrees    Right Shoulder ABduction  150 Degrees    Right Shoulder Internal Rotation  80 Degrees    Right Shoulder External Rotation  75 Degrees    Left Shoulder Extension  44 Degrees    Left Shoulder Flexion  142 Degrees    Left Shoulder ABduction  150 Degrees    Left Shoulder Internal Rotation  68 Degrees    Left Shoulder External Rotation  55 Degrees    Cervical Flexion  10    Cervical Extension  28    Cervical - Right Side Bend  26    Cervical - Left Side Bend  26    Cervical - Right Rotation  48    Cervical - Left Rotation  42      Strength   Overall Strength Comments  pain with MMT on the Left    Strength Assessment Site  Shoulder    Right/Left Shoulder  Right;Left    Right Shoulder Flexion  4+/5    Right Shoulder Extension  4+/5    Right Shoulder Internal Rotation  4+/5    Right Shoulder External Rotation  4+/5    Left Shoulder Flexion  4-/5    Left Shoulder Extension  4-/5    Left Shoulder Internal Rotation  4-/5    Left Shoulder External Rotation  4-/5      Palpation   Palpation comment  TTP L upper trap, L posterior shoulder, L cervical paraspinals      Special Tests    Special Tests  Rotator Cuff Impingement    Rotator Cuff Impingment tests  Empty Can test;Full Can test      Empty Can test    Findings  Positive    Side  Left    Comment  pain increased and radiated into neck      Full Can test   Findings  Negative    Side  Left    Comment  soreness noted      Transfers   Five time sit to stand comments   16 seconds no upper extrermity support                Objective measurements completed on examination: See above findings.              PT Education - 02/21/20 0903    Education Details  HEP, PT POC    Person(s) Educated  Patient    Methods  Explanation;Demonstration    Comprehension  Verbalized understanding;Returned demonstration          PT Long Term Goals - 02/21/20 0943      PT LONG TERM GOAL #1   Title  Independent with a HEP and progression.    Time  6    Period  Weeks    Status  New    Target Date  04/03/20      PT LONG TERM GOAL #2   Title  Pt will improve FOTO score from 54% limitation to </= 44% limitiation in her cervical spine.  Time  6    Period  Weeks    Status  New    Target Date  04/03/20      PT LONG TERM GOAL #3   Title  Pt will improve her bilateral cervical rotation to >/= 60 degrees in order to improve driving safety.    Baseline  see flow sheets    Time  6    Period  Weeks    Status  New    Target Date  04/03/20      PT LONG TERM GOAL #4   Title  Pt will  be able to lift 5 pound object from counter to over shoulder height with no pain using L UE.    Baseline  increased pain with AROM    Time  6    Period  Weeks    Status  New             Plan - 02/21/20 0950    Clinical Impression Statement  Pt arriving to therapy today for evaluation of cervical pain and L shoulder pain. Pt with increasd pain with reaching and lifting. Pt reporting difficulty with ADL's and sleeping at times. Pt presenting with weakness in L shoulder and decreased ROM. Pt with tenderness to palpation of L cervical paraspinals, L upper trap, L mid trap. Pt also with winging scapular and mild inflammation in L upper trap noted.  Pt was issued a HEP for cerivical stretching. Skilled PT needed to address pt's impairments with below interventions.    Personal Factors and Comorbidities  Comorbidity 2;Comorbidity 3+    Comorbidities  Lumbar surgery x 2, DM, left shoulder surgery, migraines, OA    Examination-Activity Limitations  Other;Carry;Sit;Reach Overhead    Examination-Participation Restrictions  Driving;Other    Stability/Clinical Decision Making  Stable/Uncomplicated    Clinical Decision Making  Low    Rehab Potential  Good    PT Frequency  2x / week    PT Duration  6 weeks    PT Treatment/Interventions  ADLs/Self Care Home Management;Cryotherapy;Electrical Stimulation;Ultrasound;Moist Heat;Iontophoresis 4mg /ml Dexamethasone;Therapeutic exercise;Neuromuscular re-education;Therapeutic activities;Patient/family education;Manual techniques;Passive range of motion;Dry needling;Stair training;Functional mobility training;Taping;Traction    PT Next Visit Plan  cervical ROM, pulleys, UBE, shoulder ROM, STM and E-stim, assess for DN of Upper trap    PT Home Exercise Plan  cervical retraciton, upper trap stretch, cervial side bending    Consulted and Agree with Plan of Care  Patient       Patient will benefit from skilled therapeutic intervention in order to improve the following deficits and impairments:  Pain, Decreased activity tolerance, Decreased range of motion, Decreased strength, Postural dysfunction, Impaired UE functional use  Visit Diagnosis: Chronic left shoulder pain  Cervicalgia  Muscle weakness (generalized)     Problem List Patient Active Problem List   Diagnosis Date Noted  . AMS (altered mental status) 05/19/2019  . Thrombocytopenia ([place]) 05/19/2019  . SBO (small bowel obstruction) s/p lap LOA 03/19/2019 03/19/2019  . Chronic migraine without aura, with intractable migraine, so stated, with status migrainosus 11/20/2018  . Lumbar radiculopathy 10/11/2018  . Atelectasis   . Hypoglycemia   .  Hypotension   . Bradycardia   . Epigastric pain 06/19/2018  . History of adenomatous polyp of colon 10/23/2017  . Hyperlipidemia associated with type 2 diabetes mellitus ([place]) 07/06/2017  . GERD (gastroesophageal reflux disease) 03/21/2017  . History of Roux-en-Y gastric bypass 2018 03/21/2017  . Morbid obesity with BMI of 40.0-44.9, adult ([place]) 06/06/2016  . Intractable  chronic migraine without aura 06/04/2015  . Abnormal uterine bleeding 05/13/2015  . Pancreatitis 02/27/2014  . Fatty liver disease, nonalcoholic [id number]  . Bipolar disorder ([place]) 02/20/2014  . Metabolic syndrome [id number]  . Vitamin D deficiency   . DM (diabetes mellitus) ([place]) 03/12/2013  . Surgery, elective 06/12/2011  . Hypothyroidism   . Constipation, chronic   . Vitamin B 12 deficiency   . ALLERGIC RHINITIS 12/31/2010  . BARRETTS ESOPHAGUS 12/31/2010  . Gastroparesis 12/31/2010  . Bilateral chronic knee pain 12/31/2010  . Obstructive sleep apnea 09/27/2010  . Migraine 09/27/2010  . RESPIRATORY FAILURE, CHRONIC 09/27/2010  . Cyst of ovary 10/19/2009    Oretha Caprice, PT 02/21/2020, 9:56 AM  [facility] [place], [place], [address] Phone: [phone]   Fax:  [phone]  Name: Morgan [name] MRN: [id number] Date of Birth: 03/16/1966

## 2020-02-21 NOTE — Patient Instructions (Signed)
Access Code: [email or web address] URL: [email or web address] Date: 02/21/2020 Prepared by: Kearney Hard  Exercises Seated Assisted Cervical Rotation with Towel - 5 reps - 10 seconds hold - 2-3x daily - 7x weekly Seated Cervical Sidebending AROM - 5 reps - 10 seconds hold - 2-3x daily - 7x weekly Seated Passive Cervical Retraction - 10 reps - 5 seconds hold - 2-3x daily - 7x weekly

## 2020-02-25 ENCOUNTER — Other Ambulatory Visit: Payer: Self-pay

## 2020-02-25 ENCOUNTER — Encounter: Payer: Self-pay | Admitting: *Deleted

## 2020-02-25 ENCOUNTER — Ambulatory Visit: Payer: Medicare Other | Admitting: *Deleted

## 2020-02-25 DIAGNOSIS — G8929 Other chronic pain: Secondary | ICD-10-CM | POA: Diagnosis not present

## 2020-02-25 DIAGNOSIS — M6281 Muscle weakness (generalized): Secondary | ICD-10-CM

## 2020-02-25 DIAGNOSIS — M25512 Pain in left shoulder: Secondary | ICD-10-CM | POA: Diagnosis not present

## 2020-02-25 DIAGNOSIS — M542 Cervicalgia: Secondary | ICD-10-CM | POA: Diagnosis not present

## 2020-02-25 NOTE — Therapy (Signed)
[facility] [place], [place], [address] Phone: [phone]   Fax:  [phone]  Physical Therapy Treatment  Patient Details  Name: Morgan [name] MRN: [id number] Date of Birth: [birth year] Referring Provider (PT): Marchia Bond MD   Encounter Date: 02/25/2020  PT End of Session - 02/25/20 0808    Visit Number  2    Number of Visits  13    Date for PT Re-Evaluation  02/06/20    Authorization Type  FOTO every 10th visit, PN every 10th visit.    PT Start Time  0815    PT Stop Time  0905    PT Time Calculation (min)  50 min       Past Medical History:  Diagnosis Date  . Allergic rhinitis   . Anemia     after gastric bypass in 2018  . Anxiety   . Barrett's esophagus   . Bipolar affective disorder ([place])   . CAP (community acquired pneumonia) 07/20/2018  . Chronic respiratory failure ([place])   . Complication of anesthesia    "they had a hard time keeping my blood sugar up after back surgery in April 2020" per pt  . Constipation, chronic   . Degenerative joint disease of spine   . Depression   . Diabetes mellitus without complication ([place])    type 2   . DM (diabetes mellitus) ([place])    "hypoglycemic" per pt - no longer takes DM meds due to weight loss from gastric bypass in 2018  . Dry eye   . Family history of adverse reaction to anesthesia    nausea and vomiting  . Gastroparesis   . GERD (gastroesophageal reflux disease)   . History of bariatric surgery 03/2017  . History of hiatal hernia   . HLD (hyperlipidemia) 03/12/2013  . Hyperlipidemia   . Hypertension    No HTN meds since weight loss from Gastric Bypass in 2018  . Intractable chronic migraine without aura 06/04/2015  . Migraine headache   . Morbid obesity ([place])   . OSA (obstructive sleep apnea)    No cpap since gastric surgery  . Osteoarthritis    bilateral knee  . SBO (small bowel obstruction) ([place]) 03/19/2019  . Sleep apnea   . Unspecified  hypothyroidism   . Vitamin B 12 deficiency   . Vitamin D deficiency     Past Surgical History:  Procedure Laterality Date  . ANKLE ARTHROSCOPY WITH RECONSTRUCTION Right 09/24/2019   Procedure: ANKLE ARTHROSCOPY DEBRIDEMENT TREATMENT OF OSTEOCHONDRAL LESION TALUS. PRONEAL TENDON DEBRIDEMENT;  Surgeon: Erle Crocker, MD;  Location: [facility];  Service: Orthopedics;  Laterality: Right;  SURGERY REQUEST TIME 2 HOURS  . APPENDECTOMY  1978  . CHOLECYSTECTOMY  2005  . COLONOSCOPY    . disc repair  05/17/2019   with rods, lumbar spine  . GASTRIC ROUX-EN-Y N/A 03/21/2017   Procedure: LAPAROSCOPIC ROUX-EN-Y GASTRIC, UPPER ENDO;  Surgeon: Greer Pickerel, MD;  [facility];  Service: General;  Laterality: N/A;  . GASTROSTOMY N/A 06/19/2018   Procedure: LAPRASCOPIC INSERTION OF GASTROSTOMY TUBE;  Surgeon: Greer Pickerel, MD;  [facility];  Service: General;  Laterality: N/A;  . GASTROSTOMY TUBE PLACEMENT Left    06/2018  . HERNIA REPAIR    . HIATAL HERNIA REPAIR N/A 06/19/2018   Procedure: LAPAROSCOPIC REPAIR OF HIATAL HERNIA;  Surgeon: Greer Pickerel, MD;  [facility];  Service: General;  Laterality: N/A;  . LAPAROSCOPIC LYSIS OF ADHESIONS  03/19/2019  Dr. Romana Juniper  . LAPAROSCOPY N/A 06/19/2018   Procedure: LAPAROSCOPY DIAGNOSTIC;  Surgeon: Greer Pickerel, MD;  [facility];  Service: General;  Laterality: N/A;  . LAPAROSCOPY N/A 03/19/2019   Procedure: LAPAROSCOPY DIAGNOSTIC lysis of adhesions;  Surgeon: Clovis Riley, MD;  [facility];  Service: General;  Laterality: N/A;  . LUMBAR LAMINECTOMY/DECOMPRESSION MICRODISCECTOMY Right 10/11/2018   Procedure: LAMINECTOMY AND FORAMINOTOMY RIGHT LUMBAR FOUR- LUMBAR FIVE;  Surgeon: Consuella Lose, MD;  Location: [facility];  Service: Neurosurgery;  Laterality: Right;  . SHOULDER ARTHROSCOPY  7/12   left-dsc  . TONSILLECTOMY  at age 84  . TRIGGER FINGER RELEASE  12/20/2012   Procedure: RELEASE TRIGGER FINGER/A-1  PULLEY;  Surgeon: Tennis Must, MD;  Location: [facility];  Service: Orthopedics;  Laterality: Left;  LEFT TRIGGER THUMB RELEASE    There were no vitals filed for this visit.  Subjective Assessment - 02/25/20 0806    Subjective  COVID-19 screen performed prior to patient entering clinic.    Pertinent History  Lumbar surgery x 2, DM, left shoulder surgery.    Patient Stated Goals  Stop hurting, driving without pain, ADL's without pain    Currently in Pain?  Yes    Pain Score  8     Pain Location  Neck    Pain Descriptors / Indicators  Aching;Sore    Pain Type  Chronic pain    Pain Onset  More than a month ago                       [facility] Adult PT Treatment/Exercise - 02/25/20 0001      Exercises   Exercises  Neck;Shoulder      Shoulder Exercises: Pulleys   Flexion  2 minutes   attempted, but increased LT shldr pain and neck pain     Modalities   Modalities  Electrical Stimulation;Ultrasound      Acupuncturist Location  LT and RT UT's and  T1-2 paras    Electrical Stimulation Action  IFC    Electrical Stimulation Parameters  80-150hz  x 15 mins    Electrical Stimulation Goals  Pain      Ultrasound   Ultrasound Location  LT and RT UT and Levator    Ultrasound Parameters  Combo 1.5 w/cm2 x 16 mins total (8 mins each side)    Ultrasound Goals  Pain      Manual Therapy   Manual Therapy  Soft tissue mobilization    Soft tissue mobilization  STW and TP release performed to Bil UTs and Levator Scap with Pt in seated position and UEs resting on pillows.                  PT Long Term Goals - 02/21/20 0943      PT LONG TERM GOAL #1   Title  Independent with a HEP and progression.    Time  6    Period  Weeks    Status  New    Target Date  04/03/20      PT LONG TERM GOAL #2   Title  Pt will improve FOTO score from 54% limitation to </= 44% limitiation in her cervical spine.    Time  6    Period   Weeks    Status  New    Target Date  04/03/20      PT LONG TERM GOAL #3   Title  Pt will improve her bilateral cervical rotation to >/= 60 degrees in order to improve driving safety.    Baseline  see flow sheets    Time  6    Period  Weeks    Status  New    Target Date  04/03/20      PT LONG TERM GOAL #4   Title  Pt will  be able to lift 5 pound object from counter to over shoulder height with no pain using L UE.    Baseline  increased pain with AROM    Time  6    Period  Weeks    Status  New            Plan - 02/25/20 [id number]    Clinical Impression Statement  Pt arrived today doing fair, but still with high pain levels. Seated pulleys were attempted, but DC due to increased pain in LT shldr and cervical. [place] combo was performed to Bil UT's and levator with pt seated and arms resting on pillows and tolerated well. STW with light to moderate pressure and TP release was performed to these same areas with some releases in Levator scapulae musculature, but not UTs due to sensitivity. Normal Modality response today.    Personal Factors and Comorbidities  Comorbidity 2;Comorbidity 3+    Comorbidities  Lumbar surgery x 2, DM, left shoulder surgery, migraines, OA    Examination-Activity Limitations  Other;Carry;Sit;Reach Overhead    Examination-Participation Restrictions  Driving;Other    Stability/Clinical Decision Making  Stable/Uncomplicated    Rehab Potential  Good    PT Frequency  2x / week    PT Duration  6 weeks    PT Treatment/Interventions  ADLs/Self Care Home Management;Cryotherapy;Electrical Stimulation;Ultrasound;Moist Heat;Iontophoresis 4mg /ml Dexamethasone;Therapeutic exercise;Neuromuscular re-education;Therapeutic activities;Patient/family education;Manual techniques;Passive range of motion;Dry needling;Stair training;Functional mobility training;Taping;Traction    PT Next Visit Plan  cervical ROM, pulleys, UBE, shoulder ROM, STM and E-stim, assess for DN of Upper trap    PT  Home Exercise Plan  cervical retraciton, upper trap stretch, cervial side bending       Patient will benefit from skilled therapeutic intervention in order to improve the following deficits and impairments:  Pain, Decreased activity tolerance, Decreased range of motion, Decreased strength, Postural dysfunction, Impaired UE functional use  Visit Diagnosis: Chronic left shoulder pain  Cervicalgia  Muscle weakness (generalized)     Problem List Patient Active Problem List   Diagnosis Date Noted  . AMS (altered mental status) 05/19/2019  . Thrombocytopenia ([place]) 05/19/2019  . SBO (small bowel obstruction) s/p lap LOA 03/19/2019 03/19/2019  . Chronic migraine without aura, with intractable migraine, so stated, with status migrainosus 11/20/2018  . Lumbar radiculopathy 10/11/2018  . Atelectasis   . Hypoglycemia   . Hypotension   . Bradycardia   . Epigastric pain 06/19/2018  . History of adenomatous polyp of colon 10/23/2017  . Hyperlipidemia associated with type 2 diabetes mellitus ([place]) 07/06/2017  . GERD (gastroesophageal reflux disease) 03/21/2017  . History of Roux-en-Y gastric bypass 2018 03/21/2017  . Morbid obesity with BMI of 40.0-44.9, adult ([place]) 06/06/2016  . Intractable chronic migraine without aura 06/04/2015  . Abnormal uterine bleeding 05/13/2015  . Pancreatitis 02/27/2014  . Fatty liver disease, nonalcoholic [id number]  . Bipolar disorder ([place]) 02/20/2014  . Metabolic syndrome [id number]  . Vitamin D deficiency   . DM (diabetes mellitus) ([place]) 03/12/2013  . Surgery, elective 06/12/2011  . Hypothyroidism   . Constipation, chronic   . Vitamin B 12 deficiency   .  ALLERGIC RHINITIS 12/31/2010  . BARRETTS ESOPHAGUS 12/31/2010  . Gastroparesis 12/31/2010  . Bilateral chronic knee pain 12/31/2010  . Obstructive sleep apnea 09/27/2010  . Migraine 09/27/2010  . RESPIRATORY FAILURE, CHRONIC 09/27/2010  . Cyst of ovary 10/19/2009    Bradely Rudin,CHRIS, PTA 02/25/2020,  9:36 AM  [facility] [place], [place], [address] Phone: [phone]   Fax:  [phone]  Name: Morgan [name] MRN: [id number] Date of Birth: November 30, 1966

## 2020-02-27 ENCOUNTER — Ambulatory Visit: Payer: Medicare Other | Admitting: *Deleted

## 2020-02-27 ENCOUNTER — Other Ambulatory Visit: Payer: Self-pay

## 2020-02-27 DIAGNOSIS — G8929 Other chronic pain: Secondary | ICD-10-CM | POA: Diagnosis not present

## 2020-02-27 DIAGNOSIS — M25512 Pain in left shoulder: Secondary | ICD-10-CM | POA: Diagnosis not present

## 2020-02-27 DIAGNOSIS — M6281 Muscle weakness (generalized): Secondary | ICD-10-CM | POA: Diagnosis not present

## 2020-02-27 DIAGNOSIS — M542 Cervicalgia: Secondary | ICD-10-CM | POA: Diagnosis not present

## 2020-02-27 NOTE — Therapy (Signed)
[facility] [place], [place], [address] Phone: [phone]   Fax:  [phone]  Physical Therapy Treatment  Patient Details  Name: Morgan [name] MRN: [id number] Date of Birth: [birth year] Referring Provider (PT): Marchia Bond MD   Encounter Date: 02/27/2020  PT End of Session - 02/27/20 0809    Visit Number  3    Number of Visits  13    Date for PT Re-Evaluation  02/06/20    Authorization Type  FOTO every 10th visit, PN every 10th visit.    PT Start Time  0815    PT Stop Time  0900    PT Time Calculation (min)  45 min       Past Medical History:  Diagnosis Date  . Allergic rhinitis   . Anemia     after gastric bypass in 2018  . Anxiety   . Barrett's esophagus   . Bipolar affective disorder ([place])   . CAP (community acquired pneumonia) 07/20/2018  . Chronic respiratory failure ([place])   . Complication of anesthesia    "they had a hard time keeping my blood sugar up after back surgery in April 2020" per pt  . Constipation, chronic   . Degenerative joint disease of spine   . Depression   . Diabetes mellitus without complication ([place])    type 2   . DM (diabetes mellitus) ([place])    "hypoglycemic" per pt - no longer takes DM meds due to weight loss from gastric bypass in 2018  . Dry eye   . Family history of adverse reaction to anesthesia    nausea and vomiting  . Gastroparesis   . GERD (gastroesophageal reflux disease)   . History of bariatric surgery 03/2017  . History of hiatal hernia   . HLD (hyperlipidemia) 03/12/2013  . Hyperlipidemia   . Hypertension    No HTN meds since weight loss from Gastric Bypass in 2018  . Intractable chronic migraine without aura 06/04/2015  . Migraine headache   . Morbid obesity ([place])   . OSA (obstructive sleep apnea)    No cpap since gastric surgery  . Osteoarthritis    bilateral knee  . SBO (small bowel obstruction) ([place]) 03/19/2019  . Sleep apnea   . Unspecified  hypothyroidism   . Vitamin B 12 deficiency   . Vitamin D deficiency     Past Surgical History:  Procedure Laterality Date  . ANKLE ARTHROSCOPY WITH RECONSTRUCTION Right 09/24/2019   Procedure: ANKLE ARTHROSCOPY DEBRIDEMENT TREATMENT OF OSTEOCHONDRAL LESION TALUS. PRONEAL TENDON DEBRIDEMENT;  Surgeon: Erle Crocker, MD;  Location: [facility];  Service: Orthopedics;  Laterality: Right;  SURGERY REQUEST TIME 2 HOURS  . APPENDECTOMY  1978  . CHOLECYSTECTOMY  2005  . COLONOSCOPY    . disc repair  05/17/2019   with rods, lumbar spine  . GASTRIC ROUX-EN-Y N/A 03/21/2017   Procedure: LAPAROSCOPIC ROUX-EN-Y GASTRIC, UPPER ENDO;  Surgeon: Greer Pickerel, MD;  [facility];  Service: General;  Laterality: N/A;  . GASTROSTOMY N/A 06/19/2018   Procedure: LAPRASCOPIC INSERTION OF GASTROSTOMY TUBE;  Surgeon: Greer Pickerel, MD;  [facility];  Service: General;  Laterality: N/A;  . GASTROSTOMY TUBE PLACEMENT Left    06/2018  . HERNIA REPAIR    . HIATAL HERNIA REPAIR N/A 06/19/2018   Procedure: LAPAROSCOPIC REPAIR OF HIATAL HERNIA;  Surgeon: Greer Pickerel, MD;  [facility];  Service: General;  Laterality: N/A;  . LAPAROSCOPIC LYSIS OF ADHESIONS  03/19/2019  Dr. Romana Juniper  . LAPAROSCOPY N/A 06/19/2018   Procedure: LAPAROSCOPY DIAGNOSTIC;  Surgeon: Greer Pickerel, MD;  [facility];  Service: General;  Laterality: N/A;  . LAPAROSCOPY N/A 03/19/2019   Procedure: LAPAROSCOPY DIAGNOSTIC lysis of adhesions;  Surgeon: Clovis Riley, MD;  [facility];  Service: General;  Laterality: N/A;  . LUMBAR LAMINECTOMY/DECOMPRESSION MICRODISCECTOMY Right 10/11/2018   Procedure: LAMINECTOMY AND FORAMINOTOMY RIGHT LUMBAR FOUR- LUMBAR FIVE;  Surgeon: Consuella Lose, MD;  Location: [facility];  Service: Neurosurgery;  Laterality: Right;  . SHOULDER ARTHROSCOPY  7/12   left-dsc  . TONSILLECTOMY  at age 64  . TRIGGER FINGER RELEASE  12/20/2012   Procedure: RELEASE TRIGGER FINGER/A-1  PULLEY;  Surgeon: Tennis Must, MD;  Location: [facility];  Service: Orthopedics;  Laterality: Left;  LEFT TRIGGER THUMB RELEASE    There were no vitals filed for this visit.  Subjective Assessment - 02/27/20 0806    Subjective  COVID-19 screen performed prior to patient entering clinic.neck is about the same    Pertinent History  Lumbar surgery x 2, DM, left shoulder surgery.    Patient Stated Goals  Stop hurting, driving without pain, ADL's without pain    Pain Score  7     Pain Location  Neck    Pain Orientation  Lower    Pain Descriptors / Indicators  Aching;Sore    Pain Onset  More than a month ago                       [facility] Adult PT Treatment/Exercise - 02/27/20 0001      Exercises   Exercises  Neck;Shoulder      Modalities   Modalities  Electrical Stimulation;Ultrasound      Acupuncturist Location  LT and RT UT's and  T1-2 paras    Electrical Stimulation Action  IFC seated     Electrical Stimulation Parameters  80-150hz  x 15 mins    Electrical Stimulation Goals  Pain      Ultrasound   Ultrasound Location  LT and RT UT    Ultrasound Parameters  Combo 1.5 w/cm2 x 12 mins    Ultrasound Goals  Pain      Manual Therapy   Manual Therapy  Soft tissue mobilization    Soft tissue mobilization  STW and TP release performed to Bil UTs and Levator Scap, cervical paras and multifidus with Pt supine      Passive ROM  Light manual traction  in supine f/b PROM for cervical rotation                  PT Long Term Goals - 02/21/20 0943      PT LONG TERM GOAL #1   Title  Independent with a HEP and progression.    Time  6    Period  Weeks    Status  New    Target Date  04/03/20      PT LONG TERM GOAL #2   Title  Pt will improve FOTO score from 54% limitation to </= 44% limitiation in her cervical spine.    Time  6    Period  Weeks    Status  New    Target Date  04/03/20      PT LONG TERM GOAL #3    Title  Pt will improve her bilateral cervical rotation to >/= 60 degrees in order to improve driving safety.  Baseline  see flow sheets    Time  6    Period  Weeks    Status  New    Target Date  04/03/20      PT LONG TERM GOAL #4   Title  Pt will  be able to lift 5 pound object from counter to over shoulder height with no pain using L UE.    Baseline  increased pain with AROM    Time  6    Period  Weeks    Status  New            Plan - 02/27/20 [id number]    Clinical Impression Statement  Pt arrived today doing about the same 7/10 neck pain. Pt did well with [place] combo, but was unable to tolerate any manual techniques very well today. Gentle traction and STW was attempted in supine, but pt experienced shooting pains into neck and B/W shldr blades. E-stim was only tolerated for 10 mins.    Personal Factors and Comorbidities  Comorbidity 2;Comorbidity 3+    Comorbidities  Lumbar surgery x 2, DM, left shoulder surgery, migraines, OA    Examination-Activity Limitations  Other;Carry;Sit;Reach Overhead    Stability/Clinical Decision Making  Stable/Uncomplicated    Rehab Potential  Good    PT Frequency  2x / week    PT Duration  6 weeks    PT Treatment/Interventions  ADLs/Self Care Home Management;Cryotherapy;Electrical Stimulation;Ultrasound;Moist Heat;Iontophoresis 4mg /ml Dexamethasone;Therapeutic exercise;Neuromuscular re-education;Therapeutic activities;Patient/family education;Manual techniques;Passive range of motion;Dry needling;Stair training;Functional mobility training;Taping;Traction    PT Next Visit Plan  cervical ROM, pulleys, UBE, shoulder ROM, STM and E-stim, assess for DN of Upper trap.   [place] combo only Rx tolerated well today.    PT Home Exercise Plan  cervical retraciton, upper trap stretch, cervial side bending    Consulted and Agree with Plan of Care  Patient       Patient will benefit from skilled therapeutic intervention in order to improve the following deficits and  impairments:  Pain, Decreased activity tolerance, Decreased range of motion, Decreased strength, Postural dysfunction, Impaired UE functional use  Visit Diagnosis: Chronic left shoulder pain  Cervicalgia     Problem List Patient Active Problem List   Diagnosis Date Noted  . AMS (altered mental status) 05/19/2019  . Thrombocytopenia ([place]) 05/19/2019  . SBO (small bowel obstruction) s/p lap LOA 03/19/2019 03/19/2019  . Chronic migraine without aura, with intractable migraine, so stated, with status migrainosus 11/20/2018  . Lumbar radiculopathy 10/11/2018  . Atelectasis   . Hypoglycemia   . Hypotension   . Bradycardia   . Epigastric pain 06/19/2018  . History of adenomatous polyp of colon 10/23/2017  . Hyperlipidemia associated with type 2 diabetes mellitus ([place]) 07/06/2017  . GERD (gastroesophageal reflux disease) 03/21/2017  . History of Roux-en-Y gastric bypass 2018 03/21/2017  . Morbid obesity with BMI of 40.0-44.9, adult ([place]) 06/06/2016  . Intractable chronic migraine without aura 06/04/2015  . Abnormal uterine bleeding 05/13/2015  . Pancreatitis 02/27/2014  . Fatty liver disease, nonalcoholic [id number]  . Bipolar disorder ([place]) 02/20/2014  . Metabolic syndrome [id number]  . Vitamin D deficiency   . DM (diabetes mellitus) ([place]) 03/12/2013  . Surgery, elective 06/12/2011  . Hypothyroidism   . Constipation, chronic   . Vitamin B 12 deficiency   . ALLERGIC RHINITIS 12/31/2010  . BARRETTS ESOPHAGUS 12/31/2010  . Gastroparesis 12/31/2010  . Bilateral chronic knee pain 12/31/2010  . Obstructive sleep apnea 09/27/2010  . Migraine 09/27/2010  . RESPIRATORY FAILURE,  CHRONIC 09/27/2010  . Cyst of ovary 10/19/2009    Nasir Bright,CHRIS, PTA 02/27/2020, 9:44 AM  [facility] [place], [place], [address] Phone: [phone]   Fax:  [phone]  Name: Morgan [name] MRN: [id number] Date of Birth: 1966/11/19

## 2020-02-28 DIAGNOSIS — M7671 Peroneal tendinitis, right leg: Secondary | ICD-10-CM | POA: Diagnosis not present

## 2020-03-03 ENCOUNTER — Other Ambulatory Visit: Payer: Self-pay

## 2020-03-03 ENCOUNTER — Ambulatory Visit: Payer: Medicare Other | Admitting: Physical Therapy

## 2020-03-03 DIAGNOSIS — M542 Cervicalgia: Secondary | ICD-10-CM

## 2020-03-03 DIAGNOSIS — M25512 Pain in left shoulder: Secondary | ICD-10-CM | POA: Diagnosis not present

## 2020-03-03 DIAGNOSIS — G8929 Other chronic pain: Secondary | ICD-10-CM | POA: Diagnosis not present

## 2020-03-03 DIAGNOSIS — M6281 Muscle weakness (generalized): Secondary | ICD-10-CM | POA: Diagnosis not present

## 2020-03-03 NOTE — Therapy (Signed)
[facility] [place], [place], [address] Phone: [phone]   Fax:  [phone]  Physical Therapy Treatment  Patient Details  Name: Morgan [name] MRN: [id number] Date of Birth: 02-26-1966 Referring Provider (PT): Marchia Bond MD   Encounter Date: 03/03/2020  PT End of Session - 03/03/20 0942    Visit Number  4    Number of Visits  13    Date for PT Re-Evaluation  02/06/20    Authorization Type  FOTO every 10th visit, PN every 10th visit.    PT Start Time  0809    PT Stop Time  0908    PT Time Calculation (min)  59 min    Activity Tolerance  Patient tolerated treatment well    Behavior During Therapy  [facility] for tasks assessed/performed       Past Medical History:  Diagnosis Date  . Allergic rhinitis   . Anemia     after gastric bypass in 2018  . Anxiety   . Barrett's esophagus   . Bipolar affective disorder ([place])   . CAP (community acquired pneumonia) 07/20/2018  . Chronic respiratory failure ([place])   . Complication of anesthesia    "they had a hard time keeping my blood sugar up after back surgery in April 2020" per pt  . Constipation, chronic   . Degenerative joint disease of spine   . Depression   . Diabetes mellitus without complication ([place])    type 2   . DM (diabetes mellitus) ([place])    "hypoglycemic" per pt - no longer takes DM meds due to weight loss from gastric bypass in 2018  . Dry eye   . Family history of adverse reaction to anesthesia    nausea and vomiting  . Gastroparesis   . GERD (gastroesophageal reflux disease)   . History of bariatric surgery 03/2017  . History of hiatal hernia   . HLD (hyperlipidemia) 03/12/2013  . Hyperlipidemia   . Hypertension    No HTN meds since weight loss from Gastric Bypass in 2018  . Intractable chronic migraine without aura 06/04/2015  . Migraine headache   . Morbid obesity ([place])   . OSA (obstructive sleep apnea)    No cpap since gastric surgery  .  Osteoarthritis    bilateral knee  . SBO (small bowel obstruction) ([place]) 03/19/2019  . Sleep apnea   . Unspecified hypothyroidism   . Vitamin B 12 deficiency   . Vitamin D deficiency     Past Surgical History:  Procedure Laterality Date  . ANKLE ARTHROSCOPY WITH RECONSTRUCTION Right 09/24/2019   Procedure: ANKLE ARTHROSCOPY DEBRIDEMENT TREATMENT OF OSTEOCHONDRAL LESION TALUS. PRONEAL TENDON DEBRIDEMENT;  Surgeon: Erle Crocker, MD;  Location: [place];  Service: Orthopedics;  Laterality: Right;  SURGERY REQUEST TIME 2 HOURS  . APPENDECTOMY  1978  . CHOLECYSTECTOMY  2005  . COLONOSCOPY    . disc repair  05/17/2019   with rods, lumbar spine  . GASTRIC ROUX-EN-Y N/A 03/21/2017   Procedure: LAPAROSCOPIC ROUX-EN-Y GASTRIC, UPPER ENDO;  Surgeon: Greer Pickerel, MD;  Location: [facility];  Service: General;  Laterality: N/A;  . GASTROSTOMY N/A 06/19/2018   Procedure: LAPRASCOPIC INSERTION OF GASTROSTOMY TUBE;  Surgeon: Greer Pickerel, MD;  Location: [facility];  Service: General;  Laterality: N/A;  . GASTROSTOMY TUBE PLACEMENT Left    06/2018  . HERNIA REPAIR    . HIATAL HERNIA REPAIR N/A 06/19/2018   Procedure: LAPAROSCOPIC REPAIR OF HIATAL HERNIA;  Surgeon:  Greer Pickerel, MD;  Location: [facility];  Service: General;  Laterality: N/A;  . LAPAROSCOPIC LYSIS OF ADHESIONS  03/19/2019   Dr. Romana Juniper  . LAPAROSCOPY N/A 06/19/2018   Procedure: LAPAROSCOPY DIAGNOSTIC;  Surgeon: Greer Pickerel, MD;  Location: [facility];  Service: General;  Laterality: N/A;  . LAPAROSCOPY N/A 03/19/2019   Procedure: LAPAROSCOPY DIAGNOSTIC lysis of adhesions;  Surgeon: Clovis Riley, MD;  Location: [facility];  Service: General;  Laterality: N/A;  . LUMBAR LAMINECTOMY/DECOMPRESSION MICRODISCECTOMY Right 10/11/2018   Procedure: LAMINECTOMY AND FORAMINOTOMY RIGHT LUMBAR FOUR- LUMBAR FIVE;  Surgeon: Consuella Lose, MD;  Location: [facility];  Service: Neurosurgery;  Laterality: Right;  . SHOULDER ARTHROSCOPY  7/12    left-dsc  . TONSILLECTOMY  at age 18  . TRIGGER FINGER RELEASE  12/20/2012   Procedure: RELEASE TRIGGER FINGER/A-1 PULLEY;  Surgeon: Tennis Must, MD;  Location: [facility];  Service: Orthopedics;  Laterality: Left;  LEFT TRIGGER THUMB RELEASE    There were no vitals filed for this visit.  Subjective Assessment - 03/03/20 0939    Subjective  COVID-19 screen performed prior to patient entering clinic.  Left sided worse.  Wants treatment focused on that side.    Pertinent History  Lumbar surgery x 2, DM, left shoulder surgery.    Patient Stated Goals  Stop hurting, driving without pain, ADL's without pain    Currently in Pain?  Yes    Pain Score  7     Pain Location  Neck    Pain Orientation  Left    Pain Descriptors / Indicators  Aching;Sore    Pain Type  Chronic pain    Pain Onset  More than a month ago                       [facility] Adult PT Treatment/Exercise - 03/03/20 0001      Modalities   Modalities  Electrical Stimulation;Moist Heat;Ultrasound      Moist Heat Therapy   Number Minutes Moist Heat  20 Minutes      Electrical Stimulation   Electrical Stimulation Location  LT UT.    Electrical Stimulation Action  Pre-mod.    Electrical Stimulation Parameters  80-150 Hz x 20 minutes (5 sec on and 5 sec off).    Electrical Stimulation Goals  Pain      Ultrasound   Ultrasound Location  LT UT.    Ultrasound Parameters  Combo e'stim/U/S at 1.50 W/CM2 x 12 minutes.    Ultrasound Goals  Pain      Manual Therapy   Manual Therapy  Soft tissue mobilization    Soft tissue mobilization  STW/M and TP release technique to patient's left UT x 11 minutes.       Trigger Point Dry Needling - 03/03/20 0001    Consent Given?  Yes    Education Handout Provided  Yes    Muscles Treated Head and Neck  Upper trapezius   LT UT.               PT Long Term Goals - 02/21/20 0943      PT LONG TERM GOAL #1   Title  Independent with a HEP and progression.     Time  6    Period  Weeks    Status  New    Target Date  04/03/20      PT LONG TERM GOAL #2   Title  Pt will improve FOTO  score from 54% limitation to </= 44% limitiation in her cervical spine.    Time  6    Period  Weeks    Status  New    Target Date  04/03/20      PT LONG TERM GOAL #3   Title  Pt will improve her bilateral cervical rotation to >/= 60 degrees in order to improve driving safety.    Baseline  see flow sheets    Time  6    Period  Weeks    Status  New    Target Date  04/03/20      PT LONG TERM GOAL #4   Title  Pt will  be able to lift 5 pound object from counter to over shoulder height with no pain using L UE.    Baseline  increased pain with AROM    Time  6    Period  Weeks    Status  New            Plan - 03/03/20 [id number]    Clinical Impression Statement  Excellent response to dry needling to patient's left UT.    Personal Factors and Comorbidities  Comorbidity 2;Comorbidity 3+    Comorbidities  Lumbar surgery x 2, DM, left shoulder surgery, migraines, OA    Examination-Activity Limitations  Other;Carry;Sit;Reach Overhead    Stability/Clinical Decision Making  Stable/Uncomplicated    Rehab Potential  Good    PT Frequency  2x / week    PT Duration  6 weeks    PT Treatment/Interventions  ADLs/Self Care Home Management;Cryotherapy;Electrical Stimulation;Ultrasound;Moist Heat;Iontophoresis 4mg /ml Dexamethasone;Therapeutic exercise;Neuromuscular re-education;Therapeutic activities;Patient/family education;Manual techniques;Passive range of motion;Dry needling;Stair training;Functional mobility training;Taping;Traction    PT Next Visit Plan  cervical ROM, pulleys, UBE, shoulder ROM, STM and E-stim, assess for DN of Upper trap.   [place] combo only Rx tolerated well today. DN next Rx?    PT Home Exercise Plan  cervical retraciton, upper trap stretch, cervial side bending    Consulted and Agree with Plan of Care  Patient       Patient will benefit from skilled  therapeutic intervention in order to improve the following deficits and impairments:  Pain, Decreased activity tolerance, Decreased range of motion, Decreased strength, Postural dysfunction, Impaired UE functional use  Visit Diagnosis: Chronic left shoulder pain  Cervicalgia     Problem List Patient Active Problem List   Diagnosis Date Noted  . AMS (altered mental status) 05/19/2019  . Thrombocytopenia ([place]) 05/19/2019  . SBO (small bowel obstruction) s/p lap LOA 03/19/2019 03/19/2019  . Chronic migraine without aura, with intractable migraine, so stated, with status migrainosus 11/20/2018  . Lumbar radiculopathy 10/11/2018  . Atelectasis   . Hypoglycemia   . Hypotension   . Bradycardia   . Epigastric pain 06/19/2018  . History of adenomatous polyp of colon 10/23/2017  . Hyperlipidemia associated with type 2 diabetes mellitus ([place]) 07/06/2017  . GERD (gastroesophageal reflux disease) 03/21/2017  . History of Roux-en-Y gastric bypass 2018 03/21/2017  . Morbid obesity with BMI of 40.0-44.9, adult ([place]) 06/06/2016  . Intractable chronic migraine without aura 06/04/2015  . Abnormal uterine bleeding 05/13/2015  . Pancreatitis 02/27/2014  . Fatty liver disease, nonalcoholic [id number]  . Bipolar disorder ([place]) 02/20/2014  . Metabolic syndrome [id number]  . Vitamin D deficiency   . DM (diabetes mellitus) ([place]) 03/12/2013  . Surgery, elective 06/12/2011  . Hypothyroidism   . Constipation, chronic   . Vitamin B 12 deficiency   . ALLERGIC RHINITIS 12/31/2010  .  BARRETTS ESOPHAGUS 12/31/2010  . Gastroparesis 12/31/2010  . Bilateral chronic knee pain 12/31/2010  . Obstructive sleep apnea 09/27/2010  . Migraine 09/27/2010  . RESPIRATORY FAILURE, CHRONIC 09/27/2010  . Cyst of ovary 10/19/2009    Morgan [name], Morgan [name] 03/03/2020, 9:44 AM  [facility] [address] [place], [place], [address] Phone: [phone]   Fax:   [phone]  Name: Morgan [name] MRN: [id number] Date of Birth: 02-24-1966

## 2020-03-05 ENCOUNTER — Ambulatory Visit (INDEPENDENT_AMBULATORY_CARE_PROVIDER_SITE_OTHER): Payer: Medicare Other | Admitting: *Deleted

## 2020-03-05 VITALS — Ht 64.0 in | Wt 164.0 lb

## 2020-03-05 DIAGNOSIS — Z0000 Encounter for general adult medical examination without abnormal findings: Secondary | ICD-10-CM | POA: Diagnosis not present

## 2020-03-05 NOTE — Patient Instructions (Addendum)
[facility] Maintenance Summary and Written Plan of Care  Morgan [name] ,  Thank you for allowing me to perform your Medicare Annual Wellness Visit and for your ongoing commitment to your health.   Health Maintenance & Immunization History Health Maintenance  Topic Date Due  . MAMMOGRAM  05/26/2017  . PAP SMEAR-Modifier  05/28/2018  . HEMOGLOBIN A1C  05/19/2020  . OPHTHALMOLOGY EXAM  07/02/2020  . FOOT EXAM  08/15/2020  . URINE MICROALBUMIN  08/15/2020  . COLONOSCOPY  10/18/2022  . TETANUS/TDAP  07/08/2025  . INFLUENZA VACCINE  Completed  . PNEUMOCOCCAL POLYSACCHARIDE VACCINE AGE 53-64 HIGH RISK  Completed  . HIV Screening  Completed   Immunization History  Administered Date(s) Administered  . Influenza Inj Mdck Quad With Preservative 09/11/2017  . Influenza,inj,Quad PF,6+ Mos 09/29/2016, 09/07/2017, 09/11/2018  . Influenza,inj,quad, With Preservative 09/17/2019  . Influenza-Unspecified 09/11/2016  . Pneumococcal Polysaccharide-23 06/29/2016  . Td 07/09/2015  . Tdap 07/09/2015  . Zoster Recombinat (Shingrix) 10/06/2017, 01/04/2018    These are the patient goals that we discussed: Goals Addressed            This Visit's Progress   . Exercise 150 min/wk Moderate Activity       03/05/2020 AWV Goal: Exercise for General Health   Patient will verbalize understanding of the benefits of increased physical activity:  Exercising regularly is important. It will improve your overall fitness, flexibility, and endurance.  Regular exercise also will improve your overall health. It can help you control your weight, reduce stress, and improve your bone density.  Over the next year, patient will increase physical activity as tolerated with a goal of at least 150 minutes of moderate physical activity per week.   You can tell that you are exercising at a moderate intensity if your heart starts beating faster and you start breathing faster but can still hold  a conversation.  Moderate-intensity exercise ideas include:  Walking 1 mile (1.6 km) in about 15 minutes  Biking  Hiking  Golfing  Dancing  Water aerobics  Patient will verbalize understanding of everyday activities that increase physical activity by providing examples like the following: ? Yard work, such as: ? Pushing a Conservation officer, nature ? Raking and bagging leaves ? Washing your car ? Pushing a stroller ? Shoveling snow ? Gardening ? Washing windows or floors  Patient will be able to explain general safety guidelines for exercising:   Before you start a new exercise program, talk with your health care provider.  Do not exercise so much that you hurt yourself, feel dizzy, or get very short of breath.  Wear comfortable clothes and wear shoes with good support.  Drink plenty of water while you exercise to prevent dehydration or heat stroke.  Work out until your breathing and your heartbeat get faster.     . Have 3 meals a day       03/05/2020 AWV Goal: Improved Nutrition/Diet  . Patient will verbalize understanding that diet plays an important role in overall health and that a poor diet is a risk factor for many chronic medical conditions.  . Over the next year, patient will improve self management of their diet by incorporating improved meal pattern. . Patient will utilize available community resources to help with food acquisition if needed (ex: food pantries, [address], etc) . Patient will work with nutrition specialist if a referral was made         This is a list of Health  Maintenance Items that are overdue or due now: Health Maintenance Due  Topic Date Due  . MAMMOGRAM  05/26/2017  . PAP SMEAR-Modifier  05/28/2018     Orders/Referrals Placed Today: No orders of the defined types were placed in this encounter.  (Contact our referral department at [phone] if you have not spoken with someone about your referral appointment within the next 5 days)     Follow-up Plan  Follow-up with Janora Norlander, DO as planned

## 2020-03-05 NOTE — Progress Notes (Signed)
MEDICARE ANNUAL WELLNESS VISIT  03/05/2020  Telephone Visit Disclaimer This Medicare AWV was conducted by telephone due to national recommendations for restrictions regarding the COVID-19 Pandemic (e.g. social distancing).  I verified, using two identifiers, that I am speaking with Morgan [name] or their authorized healthcare agent. I discussed the limitations, risks, security, and privacy concerns of performing an evaluation and management service by telephone and the potential availability of an in-person appointment in the future. The patient expressed understanding and agreed to proceed.   Subjective:  Morgan [name] is a 54 y.o. female patient of Janora Norlander, DO who had a Medicare Annual Wellness Visit today via telephone. Arai is Disabled and lives with their family. she has 0 children. she reports that she is socially active and does interact with friends/family regularly. she is minimally physically active and enjoys reading, solitaire and word search puzzles.  Patient Care Team: Janora Norlander, DO as PCP - General (Family Medicine) Skeet Latch, MD as PCP - Cardiology (Cardiology) Allyn Kenner, DO as Attending Physician (Obstetrics and Gynecology) Marchia Bond, MD as Attending Physician (Orthopedic Surgery) Mixon, Vinie Sill as Consulting Physician (Unknown Physician Specialty) Harlen Labs, MD as Referring Physician (Optometry) Deneise Lever, MD as Consulting Physician (Pulmonary Disease) Almedia Balls, MD as Consulting Physician (Orthopedic Surgery) Skeet Latch, MD as Attending Physician (Cardiology) Greer Pickerel, MD as Consulting Physician (General Surgery) Consuella Lose, MD as Consulting Physician (Neurosurgery) Ilean China, RN as Registered Nurse  Advanced Directives 03/05/2020 02/21/2020 11/14/2019 09/24/2019 09/17/2019 05/17/2019 05/17/2019  Does Patient Have a Medical Advance Directive? Yes No Yes Yes - Yes Yes  Type of Advance  Directive Healthcare Power of [place]  Does patient want to make changes to medical advance directive? No - Patient declined - - No - Guardian declined - No - Patient declined -  Copy of [place] in Chart? No - copy requested - - No - copy requested - Yes - validated most recent copy scanned in chart (See row information) Yes - validated most recent copy scanned in chart (See row information)  Would patient like information on creating a medical advance directive? No - Patient declined No - Patient declined - - - No - Patient declined -    Hospital Utilization Over the Past 12 Months: # of hospitalizations or ER visits: 1 # of surgeries: 2  Review of Systems    Patient reports that her overall health is worse compared to last year.  History obtained from chart review and the patient General ROS: negative  Patient Reported Readings (BP, Pulse, CBG, Weight, etc) none  Pain Assessment Pain : 0-10 Pain Score: 7  Pain Type: Chronic pain Pain Location: Neck Pain Orientation: Lower, Upper Pain Descriptors / Indicators: Sharp, Stabbing Pain Onset: More than a month ago Pain Frequency: Constant     Current Medications & Allergies (verified) Allergies as of 03/05/2020      Reactions   Ketoconazole Hives, Swelling   SWELLING REACTION UNSPECIFIED    Pravachol [pravastatin Sodium] Shortness Of Breath, Swelling, Anaphylaxis   Throat swelling   Statins Other (See Comments)   Leg cramps   Tape Dermatitis, Other (See Comments)   Steri-Strips   Codeine Hypertension   increased BP      Medication List       Accurate as of March 05, 2020  9:59 AM. If you have any questions, ask your nurse or doctor.        STOP taking these medications   cyclobenzaprine 10 MG tablet Commonly known as: FLEXERIL     TAKE these medications   acetaminophen 500  MG tablet Commonly known as: TYLENOL Take 1,000 mg by mouth every 4 (four) hours as needed for moderate pain.   azelastine 0.1 % nasal spray Commonly known as: ASTELIN Place 1 spray into both nostrils 2 (two) times daily.   Calcium-Vitamin D-Vitamin K 650-12.5-40 MG-MCG-MCG Chew Chew 1 tablet by mouth 2 (two) times daily.   dicyclomine 20 MG tablet Commonly known as: BENTYL Take 1 tablet every 8 (eight) hours as needed for spasms. (for abdominal cramping)   divalproex 500 MG DR tablet Commonly known as: DEPAKOTE TAKE 1 TABLET EVERY MORNING AND 2 TABLETS AT BEDTIME   docusate sodium 100 MG capsule Commonly known as: COLACE Take 1 capsule (100 mg total) by mouth 2 (two) times daily.   Emgality 120 MG/ML Soaj Generic drug: Galcanezumab-gnlm Inject 120 mg into the skin every 30 (thirty) days.   fluticasone 50 MCG/ACT nasal spray Commonly known as: FLONASE Place 2 sprays into both nostrils 2 (two) times daily as needed for allergies.   gabapentin 300 MG capsule Commonly known as: NEURONTIN TAKE 2 CAPSULES IN THE MORNING, 1 CAPSULE AT LUNCH AND 2 CAPSULES AT BEDTIME   GLUCAGON EMERGENCY IJ Inject 1 Syringe as directed as needed (emergency low blood sugar).   glucose blood test strip Commonly known as: ONE TOUCH ULTRA TEST CHECK BLOOD SUGAR FOUR TIMES A DAY.  Dx E16.2 E11.649   levothyroxine 112 MCG tablet Commonly known as: SYNTHROID TAKE 1 TABLET BEFORE BREAKFAST   Linzess 145 MCG Caps capsule Generic drug: linaclotide   loratadine 10 MG tablet Commonly known as: CLARITIN Take 10 mg by mouth daily as needed for allergies.   multivitamin capsule Take 1 capsule by mouth daily.   nystatin powder Commonly known as: MYCOSTATIN/NYSTOP Use twice daily prn groin rash. x7-10d per flare.  Please make sure she gets POWDER   ondansetron 4 MG tablet Commonly known as: ZOFRAN TAKE  (1)  TABLET  EVERY EIGHT HOURS AS NEEDED.   pantoprazole 40 MG tablet Commonly known as:  PROTONIX Take 1 tablet (40 mg total) by mouth daily.   Pazeo 0.7 % Soln Generic drug: Olopatadine HCl Place 1 drop into both eyes every morning.   polyethylene glycol 17 g packet Commonly known as: MIRALAX / GLYCOLAX Take 17 g by mouth at bedtime.   PROBIOTIC DAILY PO Take 1 capsule by mouth daily.   Restasis Multidose 0.05 % ophthalmic emulsion Generic drug: cycloSPORINE Place 1 drop into both eyes 2 (two) times daily.   sertraline 100 MG tablet Commonly known as: ZOLOFT Take 2 tablets (200 mg total) by mouth daily.   sucralfate 1 GM/10ML suspension Commonly known as: CARAFATE SMARTSIG:2 Teaspoon By Mouth 3 Times Daily   tiZANidine 2 MG tablet Commonly known as: ZANAFLEX Take 1 tablet (2 mg total) by mouth every 8 (eight) hours as needed for muscle spasms.   ziprasidone 60 MG capsule Commonly known as: GEODON Take 1 capsule (60 mg total) by mouth at bedtime.       History (reviewed): Past Medical History:  Diagnosis Date  . Allergic rhinitis   . Anemia     after gastric bypass in 2018  . Anxiety   . Barrett's esophagus   . Bipolar affective disorder ([place])   .  CAP (community acquired pneumonia) 07/20/2018  . Chronic respiratory failure ([place])   . Complication of anesthesia    "they had a hard time keeping my blood sugar up after back surgery in April 2020" per pt  . Constipation, chronic   . Degenerative joint disease of spine   . Depression   . Diabetes mellitus without complication ([place])    type 2   . DM (diabetes mellitus) ([place])    "hypoglycemic" per pt - no longer takes DM meds due to weight loss from gastric bypass in 2018  . Dry eye   . Family history of adverse reaction to anesthesia    nausea and vomiting  . Gastroparesis   . GERD (gastroesophageal reflux disease)   . History of bariatric surgery 03/2017  . History of hiatal hernia   . HLD (hyperlipidemia) 03/12/2013  . Hyperlipidemia   . Hypertension    No HTN meds since weight loss from Gastric  Bypass in 2018  . Intractable chronic migraine without aura 06/04/2015  . Migraine headache   . Morbid obesity ([place])   . OSA (obstructive sleep apnea)    No cpap since gastric surgery  . Osteoarthritis    bilateral knee  . SBO (small bowel obstruction) ([place]) 03/19/2019  . Sleep apnea   . Unspecified hypothyroidism   . Vitamin B 12 deficiency   . Vitamin D deficiency    Past Surgical History:  Procedure Laterality Date  . ANKLE ARTHROSCOPY WITH RECONSTRUCTION Right 09/24/2019   Procedure: ANKLE ARTHROSCOPY DEBRIDEMENT TREATMENT OF OSTEOCHONDRAL LESION TALUS. PRONEAL TENDON DEBRIDEMENT;  Surgeon: Erle Crocker, MD;  Location: [facility];  Service: Orthopedics;  Laterality: Right;  SURGERY REQUEST TIME 2 HOURS  . APPENDECTOMY  1978  . CHOLECYSTECTOMY  2005  . COLONOSCOPY    . disc repair  05/17/2019   with rods, lumbar spine  . GASTRIC ROUX-EN-Y N/A 03/21/2017   Procedure: LAPAROSCOPIC ROUX-EN-Y GASTRIC, UPPER ENDO;  Surgeon: Greer Pickerel, MD;  Location: [facility];  Service: General;  Laterality: N/A;  . GASTROSTOMY N/A 06/19/2018   Procedure: LAPRASCOPIC INSERTION OF GASTROSTOMY TUBE;  Surgeon: Greer Pickerel, MD;  Location: [facility];  Service: General;  Laterality: N/A;  . GASTROSTOMY TUBE PLACEMENT Left    06/2018  . HERNIA REPAIR    . HIATAL HERNIA REPAIR N/A 06/19/2018   Procedure: LAPAROSCOPIC REPAIR OF HIATAL HERNIA;  Surgeon: Greer Pickerel, MD;  Location: [facility];  Service: General;  Laterality: N/A;  . LAPAROSCOPIC LYSIS OF ADHESIONS  03/19/2019   Dr. Romana Juniper  . LAPAROSCOPY N/A 06/19/2018   Procedure: LAPAROSCOPY DIAGNOSTIC;  Surgeon: Greer Pickerel, MD;  Location: [facility];  Service: General;  Laterality: N/A;  . LAPAROSCOPY N/A 03/19/2019   Procedure: LAPAROSCOPY DIAGNOSTIC lysis of adhesions;  Surgeon: Clovis Riley, MD;  Location: [facility];  Service: General;  Laterality: N/A;  . LUMBAR LAMINECTOMY/DECOMPRESSION MICRODISCECTOMY Right 10/11/2018   Procedure:  LAMINECTOMY AND FORAMINOTOMY RIGHT LUMBAR FOUR- LUMBAR FIVE;  Surgeon: Consuella Lose, MD;  Location: [facility];  Service: Neurosurgery;  Laterality: Right;  . SHOULDER ARTHROSCOPY  7/12   left-dsc  . TONSILLECTOMY  at age 15  . TRIGGER FINGER RELEASE  12/20/2012   Procedure: RELEASE TRIGGER FINGER/A-1 PULLEY;  Surgeon: Tennis Must, MD;  Location: [facility];  Service: Orthopedics;  Laterality: Left;  LEFT TRIGGER THUMB RELEASE   Family History  Problem Relation Age of Onset  . Asthma Father   . Allergies Father   .  Heart disease Father        enlarged heart  . Peripheral vascular disease Father   . Diabetes Father   . Hyperlipidemia Father   . Arthritis Father   . Asthma Sister   . Cancer Sister        colon at 63 yr old.  . Colon cancer Sister   . Allergies Mother   . Stroke Mother 23       with hemi paralysis  . Diabetes Mother   . Hyperlipidemia Mother   . Hypertension Mother   . GI problems Mother   . Arthritis Mother   . Allergies Brother   . Early death Brother 9       congenital abormality  . Allergies Sister   . Diabetes Sister   . Asthma Sister   . Colon polyps Sister   . Hyperlipidemia Sister   . GI problems Sister        gastroporesis   . Liver disease Sister        fatty liver  . Stroke Sister        intercrandial bleed  . Diabetes Brother   . Hypertension Brother   . Hyperlipidemia Brother   . Heart disease Paternal Aunt   . Migraines Neg Hx    Social History   Socioeconomic History  . Marital status: Single    Spouse name: Not on file  . Number of children: 0  . Years of education: HS  . Highest education level: 12th grade  Occupational History  . Occupation: disabled    Fish farm manager: UNEMPLOYED  Tobacco Use  . Smoking status: Never Smoker  . Smokeless tobacco: Never Used  Substance and Sexual Activity  . Alcohol use: No  . Drug use: No  . Sexual activity: Never    Birth control/protection: Post-menopausal    Comment: LMP  10/2017  Other Topics Concern  . Not on file  Social History Narrative   Patient is right handed.   Patient drinks 2 glasses of caffeine daily.   Lives at home. Her niece is living with her right now.   Social Determinants of Health   Financial Resource Strain: Low Risk   . Difficulty of Paying Living Expenses: Not very hard  Food Insecurity:   . Worried About Charity fundraiser in the Last Year:   . Arboriculturist in the Last Year:   Transportation Needs: No Transportation Needs  . Lack of Transportation (Medical): No  . Lack of Transportation (Non-Medical): No  Physical Activity: Inactive  . Days of Exercise per Week: 0 days  . Minutes of Exercise per Session: 0 min  Stress:   . Feeling of Stress :   Social Connections: Slightly Isolated  . Frequency of Communication with Friends and Family: More than three times a week  . Frequency of Social Gatherings with Friends and Family: More than three times a week  . Attends Religious Services: More than 4 times per year  . Active Member of Clubs or Organizations: Yes  . Attends Archivist Meetings: More than 4 times per year  . Marital Status: Never married    Activities of Daily Living In your present state of health, do you have any difficulty performing the following activities: 03/05/2020 09/24/2019  Hearing? N N  Vision? N N  Difficulty concentrating or making decisions? N N  Walking or climbing stairs? Y N  Dressing or bathing? N N  Doing errands, shopping? N -  Preparing Food and eating ? N -  Using the Toilet? N -  In the past six months, have you accidently leaked urine? N -  Do you have problems with loss of bowel control? N -  Managing your Medications? N -  Managing your Finances? N -  Housekeeping or managing your Housekeeping? N -  Some recent data might be hidden    Patient Education/ Literacy How often do you need to have someone help you when you read instructions, pamphlets, or other written  materials from your doctor or pharmacy?: 1 - Never What is the last grade level you completed in school?: 12th Grade  Exercise Current Exercise Habits: The patient does not participate in regular exercise at present, Exercise limited by: None identified  Diet Patient reports consuming 3 meals a day and 3 snack(s) a day Patient reports that her primary diet is: Regular Patient reports that she does have regular access to food.   Depression Screen PHQ 2/9 Scores 03/05/2020 02/17/2020 11/19/2019 08/16/2019 01/14/2019 09/11/2018 07/27/2018  PHQ - 2 Score 2 4 6 6 4 3 4   PHQ- 9 Score 8 9 12  - 12 4 8      Fall Risk Fall Risk  03/05/2020 09/11/2018 07/19/2018 05/10/2018 01/08/2018  Falls in the past year? 0 No No No Yes  Number falls in past yr: 0 - - - 1  Injury with Fall? 0 - - - No  Risk for fall due to : Impaired mobility - - - -  Follow up Falls evaluation completed - - - -     Objective:  Morgan [name] seemed alert and oriented and she participated appropriately during our telephone visit.  Blood Pressure Weight BMI  BP Readings from Last 3 Encounters:  02/17/20 124/74  11/19/19 117/81  09/24/19 103/67   Wt Readings from Last 3 Encounters:  03/05/20 164 lb 0.4 oz (74.4 kg)  02/17/20 164 lb (74.4 kg)  09/24/19 154 lb 12.2 oz (70.2 kg)   BMI Readings from Last 1 Encounters:  03/05/20 28.15 kg/m    *Unable to obtain current vital signs, weight, and BMI due to telephone visit type  Hearing/Vision  . Pessy did not seem to have difficulty with hearing/understanding during the telephone conversation . Reports that she has not had a formal eye exam by an eye care professional within the past year . Reports that she has not had a formal hearing evaluation within the past year *Unable to fully assess hearing and vision during telephone visit type  Cognitive Function: 6CIT Screen 03/05/2020  What Year? 0 points  What month? 0 points  What time? 0 points  Count back from 20 0 points    Months in reverse 0 points  Repeat phrase 0 points  Total Score 0   (Normal:0-7, Significant for Dysfunction: >8)  Normal Cognitive Function Screening: Yes   Immunization & Health Maintenance Record Immunization History  Administered Date(s) Administered  . Influenza Inj Mdck Quad With Preservative 09/11/2017  . Influenza,inj,Quad PF,6+ Mos 09/29/2016, 09/07/2017, 09/11/2018  . Influenza,inj,quad, With Preservative 09/17/2019  . Influenza-Unspecified 09/11/2016  . Pneumococcal Polysaccharide-23 06/29/2016  . Td 07/09/2015  . Tdap 07/09/2015  . Zoster Recombinat (Shingrix) 10/06/2017, 01/04/2018    Health Maintenance  Topic Date Due  . MAMMOGRAM  05/26/2017  . PAP SMEAR-Modifier  05/28/2018  . HEMOGLOBIN A1C  05/19/2020  . OPHTHALMOLOGY EXAM  07/02/2020  . FOOT EXAM  08/15/2020  . URINE MICROALBUMIN  08/15/2020  . COLONOSCOPY  10/18/2022  .  TETANUS/TDAP  07/08/2025  . INFLUENZA VACCINE  Completed  . PNEUMOCOCCAL POLYSACCHARIDE VACCINE AGE 57-64 HIGH RISK  Completed  . HIV Screening  Completed       Assessment  This is a routine wellness examination for ASTRID MICHAELS.  Health Maintenance: Due or Overdue Health Maintenance Due  Topic Date Due  . MAMMOGRAM  05/26/2017  . PAP SMEAR-Modifier  05/28/2018    Morgan [name] does not need a referral for Community Assistance: Care Management:   no Social Work:    no Prescription Assistance:  no Nutrition/Diabetes Education:  no   Plan:  Personalized Goals Goals Addressed            This Visit's Progress   . Exercise 150 min/wk Moderate Activity       03/05/2020 AWV Goal: Exercise for General Health   Patient will verbalize understanding of the benefits of increased physical activity:  Exercising regularly is important. It will improve your overall fitness, flexibility, and endurance.  Regular exercise also will improve your overall health. It can help you control your weight, reduce stress, and improve  your bone density.  Over the next year, patient will increase physical activity as tolerated with a goal of at least 150 minutes of moderate physical activity per week.   You can tell that you are exercising at a moderate intensity if your heart starts beating faster and you start breathing faster but can still hold a conversation.  Moderate-intensity exercise ideas include:  Walking 1 mile (1.6 km) in about 15 minutes  Biking  Hiking  Golfing  Dancing  Water aerobics  Patient will verbalize understanding of everyday activities that increase physical activity by providing examples like the following: ? Yard work, such as: ? Pushing a Conservation officer, nature ? Raking and bagging leaves ? Washing your car ? Pushing a stroller ? Shoveling snow ? Gardening ? Washing windows or floors  Patient will be able to explain general safety guidelines for exercising:   Before you start a new exercise program, talk with your health care provider.  Do not exercise so much that you hurt yourself, feel dizzy, or get very short of breath.  Wear comfortable clothes and wear shoes with good support.  Drink plenty of water while you exercise to prevent dehydration or heat stroke.  Work out until your breathing and your heartbeat get faster.     . Have 3 meals a day       03/05/2020 AWV Goal: Improved Nutrition/Diet  . Patient will verbalize understanding that diet plays an important role in overall health and that a poor diet is a risk factor for many chronic medical conditions.  . Over the next year, patient will improve self management of their diet by incorporating improved meal pattern. . Patient will utilize available community resources to help with food acquisition if needed (ex: food pantries, [address], etc) . Patient will work with nutrition specialist if a referral was made       Personalized Health Maintenance & Screening Recommendations  Shingrix vaccine  Lung Cancer Screening  Recommended: no (Low Dose CT Chest recommended if Age 78-80 years, 30 pack-year currently smoking OR have quit w/in past 15 years) Hepatitis C Screening recommended: no HIV Screening recommended: no  Advanced Directives: Written information was not prepared per patient's request.  Referrals & Orders No orders of the defined types were placed in this encounter.   Follow-up Plan . Follow-up with Janora Norlander, DO as planned  I have personally reviewed and noted the following in the patient's chart:   . Medical and social history . Use of alcohol, tobacco or illicit drugs  . Current medications and supplements . Functional ability and status . Nutritional status . Physical activity . Advanced directives . List of other physicians . Hospitalizations, surgeries, and ER visits in previous 12 months . Vitals . Screenings to include cognitive, depression, and falls . Referrals and appointments  In addition, I have reviewed and discussed with Morgan [name] certain preventive protocols, quality metrics, and best practice recommendations. A written personalized care plan for preventive services as well as general preventive health recommendations is available and can be mailed to the patient at her request.      Wardell Heath, lpn  [id number]

## 2020-03-06 ENCOUNTER — Other Ambulatory Visit: Payer: Self-pay | Admitting: Adult Health

## 2020-03-06 ENCOUNTER — Other Ambulatory Visit: Payer: Self-pay

## 2020-03-06 ENCOUNTER — Ambulatory Visit: Payer: Medicare Other | Admitting: Physical Therapy

## 2020-03-06 DIAGNOSIS — G8929 Other chronic pain: Secondary | ICD-10-CM

## 2020-03-06 DIAGNOSIS — M6281 Muscle weakness (generalized): Secondary | ICD-10-CM | POA: Diagnosis not present

## 2020-03-06 DIAGNOSIS — M25512 Pain in left shoulder: Secondary | ICD-10-CM | POA: Diagnosis not present

## 2020-03-06 DIAGNOSIS — M542 Cervicalgia: Secondary | ICD-10-CM

## 2020-03-06 NOTE — Therapy (Signed)
[facility] [place], [place], [address] Phone: [phone]   Fax:  [phone]  Physical Therapy Treatment  Patient Details  Name: Morgan [name] MRN: [id number] Date of Birth: 02-18-1966 Referring Provider (PT): Marchia Bond MD   Encounter Date: 03/06/2020  PT End of Session - 03/06/20 5573    Visit Number  5    Number of Visits  13    Date for PT Re-Evaluation  02/06/20    Authorization Type  FOTO every 10th visit, PN every 10th visit.    PT Start Time  0815    PT Stop Time  0907    PT Time Calculation (min)  52 min    Activity Tolerance  Patient tolerated treatment well    Behavior During Therapy  [facility] for tasks assessed/performed       Past Medical History:  Diagnosis Date  . Allergic rhinitis   . Anemia     after gastric bypass in 2018  . Anxiety   . Barrett's esophagus   . Bipolar affective disorder ([place])   . CAP (community acquired pneumonia) 07/20/2018  . Chronic respiratory failure ([place])   . Complication of anesthesia    "they had a hard time keeping my blood sugar up after back surgery in April 2020" per pt  . Constipation, chronic   . Degenerative joint disease of spine   . Depression   . Diabetes mellitus without complication ([place])    type 2   . DM (diabetes mellitus) ([place])    "hypoglycemic" per pt - no longer takes DM meds due to weight loss from gastric bypass in 2018  . Dry eye   . Family history of adverse reaction to anesthesia    nausea and vomiting  . Gastroparesis   . GERD (gastroesophageal reflux disease)   . History of bariatric surgery 03/2017  . History of hiatal hernia   . HLD (hyperlipidemia) 03/12/2013  . Hyperlipidemia   . Hypertension    No HTN meds since weight loss from Gastric Bypass in 2018  . Intractable chronic migraine without aura 06/04/2015  . Migraine headache   . Morbid obesity ([place])   . OSA (obstructive sleep apnea)    No cpap since gastric surgery  .  Osteoarthritis    bilateral knee  . SBO (small bowel obstruction) ([place]) 03/19/2019  . Sleep apnea   . Unspecified hypothyroidism   . Vitamin B 12 deficiency   . Vitamin D deficiency     Past Surgical History:  Procedure Laterality Date  . ANKLE ARTHROSCOPY WITH RECONSTRUCTION Right 09/24/2019   Procedure: ANKLE ARTHROSCOPY DEBRIDEMENT TREATMENT OF OSTEOCHONDRAL LESION TALUS. PRONEAL TENDON DEBRIDEMENT;  Surgeon: Erle Crocker, MD;  Location: [place];  Service: Orthopedics;  Laterality: Right;  SURGERY REQUEST TIME 2 HOURS  . APPENDECTOMY  1978  . CHOLECYSTECTOMY  2005  . COLONOSCOPY    . disc repair  05/17/2019   with rods, lumbar spine  . GASTRIC ROUX-EN-Y N/A 03/21/2017   Procedure: LAPAROSCOPIC ROUX-EN-Y GASTRIC, UPPER ENDO;  Surgeon: Greer Pickerel, MD;  [facility];  Service: General;  Laterality: N/A;  . GASTROSTOMY N/A 06/19/2018   Procedure: LAPRASCOPIC INSERTION OF GASTROSTOMY TUBE;  Surgeon: Greer Pickerel, MD;  [facility];  Service: General;  Laterality: N/A;  . GASTROSTOMY TUBE PLACEMENT Left    06/2018  . HERNIA REPAIR    . HIATAL HERNIA REPAIR N/A 06/19/2018   Procedure: LAPAROSCOPIC REPAIR OF HIATAL HERNIA;  Surgeon:  Greer Pickerel, MD;  [facility];  Service: General;  Laterality: N/A;  . LAPAROSCOPIC LYSIS OF ADHESIONS  03/19/2019   Dr. Romana Juniper  . LAPAROSCOPY N/A 06/19/2018   Procedure: LAPAROSCOPY DIAGNOSTIC;  Surgeon: Greer Pickerel, MD;  [facility];  Service: General;  Laterality: N/A;  . LAPAROSCOPY N/A 03/19/2019   Procedure: LAPAROSCOPY DIAGNOSTIC lysis of adhesions;  Surgeon: Clovis Riley, MD;  [facility];  Service: General;  Laterality: N/A;  . LUMBAR LAMINECTOMY/DECOMPRESSION MICRODISCECTOMY Right 10/11/2018   Procedure: LAMINECTOMY AND FORAMINOTOMY RIGHT LUMBAR FOUR- LUMBAR FIVE;  Surgeon: Consuella Lose, MD;  Location: [place];  Service: Neurosurgery;  Laterality: Right;  . SHOULDER ARTHROSCOPY  7/12    left-dsc  . TONSILLECTOMY  at age 39  . TRIGGER FINGER RELEASE  12/20/2012   Procedure: RELEASE TRIGGER FINGER/A-1 PULLEY;  Surgeon: Tennis Must, MD;  Location: [place];  Service: Orthopedics;  Laterality: Left;  LEFT TRIGGER THUMB RELEASE    There were no vitals filed for this visit.  Subjective Assessment - 03/06/20 1236    Subjective  COVID-19 screen performed prior to patient entering clinic.  Only got 30 minutes of relief from dry needling.  Patient requesting discharge at this time.    Pertinent History  Lumbar surgery x 2, DM, left shoulder surgery.    Patient Stated Goals  Stop hurting, driving without pain, ADL's without pain    Currently in Pain?  Yes    Pain Score  7     Pain Location  Neck    Pain Descriptors / Indicators  Sharp;Stabbing    Pain Type  Chronic pain    Pain Onset  More than a month ago                       [facility] Adult PT Treatment/Exercise - 03/06/20 0001      Modalities   Modalities  Electrical Stimulation;Moist Heat;Ultrasound      Moist Heat Therapy   Number Minutes Moist Heat  20 Minutes    Moist Heat Location  --   Cervical.     Electrical Stimulation   Electrical Stimulation Location  Bilateral UT's and iupper thoracic musculature.    Electrical Stimulation Action  IFC    Electrical Stimulation Parameters  80-150 Hz x 20 minutes at 100% scan    Electrical Stimulation Goals  Pain      Ultrasound   Ultrasound Location  Bilateral UT's and upper thoracic musculature.    Ultrasound Parameters  U/S at 1.50 W/CM2 x 12 minutes.    Ultrasound Goals  Pain      Manual Therapy   Manual Therapy  Soft tissue mobilization    Soft tissue mobilization  STW/M x 11 minutes to patient's bilateral UT's and upper thoracic musculature.                  PT Long Term Goals - 03/06/20 1245      PT LONG TERM GOAL #1   Title  Independent with a HEP and progression.    Time  6    Period  Weeks    Status  Partially  Met      PT LONG TERM GOAL #2   Title  Pt will improve FOTO score from 54% limitation to </= 44% limitiation in her cervical spine.    Time  6    Period  Weeks    Status  Not Met  PT LONG TERM GOAL #3   Title  Pt will improve her bilateral cervical rotation to >/= 60 degrees in order to improve driving safety.    Time  6    Period  Weeks    Status  Not Met      PT LONG TERM GOAL #4   Title  Pt will  be able to lift 5 pound object from counter to over shoulder height with no pain using L UE.    Status  Not Met            Plan - 03/06/20 1242    Clinical Impression Statement  Patient receiving only temporary relief from treatments.  Requesting discharge.    Personal Factors and Comorbidities  Comorbidity 2;Comorbidity 3+    Comorbidities  Lumbar surgery x 2, DM, left shoulder surgery, migraines, OA    Examination-Activity Limitations  Other;Carry;Sit;Reach Overhead    Examination-Participation Restrictions  Driving;Other    Stability/Clinical Decision Making  Stable/Uncomplicated    Rehab Potential  Good    PT Frequency  2x / week    PT Duration  6 weeks    PT Treatment/Interventions  ADLs/Self Care Home Management;Cryotherapy;Electrical Stimulation;Ultrasound;Moist Heat;Iontophoresis 45m/ml Dexamethasone;Therapeutic exercise;Neuromuscular re-education;Therapeutic activities;Patient/family education;Manual techniques;Passive range of motion;Dry needling;Stair training;Functional mobility training;Taping;Traction    PT Next Visit Plan  cervical ROM, pulleys, UBE, shoulder ROM, STM and E-stim, assess for DN of Upper trap.   [place] Rx tolerated well today. DN next Rx?    PT Home Exercise Plan  cervical retraciton, upper trap stretch, cervical side bending    Consulted and Agree with Plan of Care  Patient       Patient will benefit from skilled therapeutic intervention in order to improve the following deficits and impairments:  Pain, Decreased activity tolerance,  Decreased range of motion, Decreased strength, Postural dysfunction, Impaired UE functional use  Visit Diagnosis: Chronic left shoulder pain  Cervicalgia  Muscle weakness (generalized)     Problem List Patient Active Problem List   Diagnosis Date Noted  . AMS (altered mental status) 05/19/2019  . Thrombocytopenia ([place] 05/19/2019  . SBO (small bowel obstruction) s/p lap LOA 03/19/2019 03/19/2019  . Chronic migraine without aura, with intractable migraine, so stated, with status migrainosus 11/20/2018  . Lumbar radiculopathy 10/11/2018  . Atelectasis   . Hypoglycemia   . Hypotension   . Bradycardia   . Epigastric pain 06/19/2018  . History of adenomatous polyp of colon 10/23/2017  . Hyperlipidemia associated with type 2 diabetes mellitus ([place] 07/06/2017  . GERD (gastroesophageal reflux disease) 03/21/2017  . History of Roux-en-Y gastric bypass 2018 03/21/2017  . Morbid obesity with BMI of 40.0-44.9, adult ([place] 06/06/2016  . Intractable chronic migraine without aura 06/04/2015  . Abnormal uterine bleeding 05/13/2015  . Pancreatitis 02/27/2014  . Fatty liver disease, nonalcoholic 021/19/4174 . Bipolar disorder ([place] 02/20/2014  . Metabolic syndrome 008/14/4818 . Vitamin D deficiency   . DM (diabetes mellitus) ([place] 03/12/2013  . Surgery, elective 06/12/2011  . Hypothyroidism   . Constipation, chronic   . Vitamin B 12 deficiency   . ALLERGIC RHINITIS 12/31/2010  . BARRETTS ESOPHAGUS 12/31/2010  . Gastroparesis 12/31/2010  . Bilateral chronic knee pain 12/31/2010  . Obstructive sleep apnea 09/27/2010  . Migraine 09/27/2010  . RESPIRATORY FAILURE, CHRONIC 09/27/2010  . Cyst of ovary 10/19/2009   PHYSICAL THERAPY DISCHARGE SUMMARY  Visits from Start of Care: 5.  Current functional level related to goals / functional outcomes: See above.  Remaining deficits: See goal section.   Education / Equipment: HEP. Plan: Patient agrees to discharge.  Patient goals were  not met. Patient is being discharged due to lack of progress.  ?????     Morgan [name], [place]  MPT 03/06/2020, 12:46 PM  [facility] [address] [place], [place], [address] Phone: [phone]   Fax:  [phone]  Name: Morgan [name] MRN: [id number] Date of Birth: 1966-09-05

## 2020-03-06 NOTE — Therapy (Deleted)
[facility] [place], [place], [address] Phone: [phone]   Fax:  [phone]  Physical Therapy Evaluation  Patient Details  Name: Morgan [name] MRN: [id number] Date of Birth: Jun 17, 1966 Referring Provider (PT): Marchia Bond MD   Encounter Date: 03/06/2020  PT End of Session - 03/06/20 [id number]    Visit Number  5    Number of Visits  13    Date for PT Re-Evaluation  02/06/20    Authorization Type  FOTO every 10th visit, PN every 10th visit.    PT Start Time  0815    PT Stop Time  0907    PT Time Calculation (min)  52 min    Activity Tolerance  Patient tolerated treatment well    Behavior During Therapy  [facility] for tasks assessed/performed       Past Medical History:  Diagnosis Date  . Allergic rhinitis   . Anemia     after gastric bypass in 2018  . Anxiety   . Barrett's esophagus   . Bipolar affective disorder ([place])   . CAP (community acquired pneumonia) 07/20/2018  . Chronic respiratory failure ([place])   . Complication of anesthesia    "they had a hard time keeping my blood sugar up after back surgery in April 2020" per pt  . Constipation, chronic   . Degenerative joint disease of spine   . Depression   . Diabetes mellitus without complication ([place])    type 2   . DM (diabetes mellitus) ([place])    "hypoglycemic" per pt - no longer takes DM meds due to weight loss from gastric bypass in 2018  . Dry eye   . Family history of adverse reaction to anesthesia    nausea and vomiting  . Gastroparesis   . GERD (gastroesophageal reflux disease)   . History of bariatric surgery 03/2017  . History of hiatal hernia   . HLD (hyperlipidemia) 03/12/2013  . Hyperlipidemia   . Hypertension    No HTN meds since weight loss from Gastric Bypass in 2018  . Intractable chronic migraine without aura 06/04/2015  . Migraine headache   . Morbid obesity ([place])   . OSA (obstructive sleep apnea)    No cpap since gastric surgery  .  Osteoarthritis    bilateral knee  . SBO (small bowel obstruction) ([place]) 03/19/2019  . Sleep apnea   . Unspecified hypothyroidism   . Vitamin B 12 deficiency   . Vitamin D deficiency     Past Surgical History:  Procedure Laterality Date  . ANKLE ARTHROSCOPY WITH RECONSTRUCTION Right 09/24/2019   Procedure: ANKLE ARTHROSCOPY DEBRIDEMENT TREATMENT OF OSTEOCHONDRAL LESION TALUS. PRONEAL TENDON DEBRIDEMENT;  Surgeon: Erle Crocker, MD;  Location: [place];  Service: Orthopedics;  Laterality: Right;  SURGERY REQUEST TIME 2 HOURS  . APPENDECTOMY  1978  . CHOLECYSTECTOMY  2005  . COLONOSCOPY    . disc repair  05/17/2019   with rods, lumbar spine  . GASTRIC ROUX-EN-Y N/A 03/21/2017   Procedure: LAPAROSCOPIC ROUX-EN-Y GASTRIC, UPPER ENDO;  Surgeon: Greer Pickerel, MD;  [facility];  Service: General;  Laterality: N/A;  . GASTROSTOMY N/A 06/19/2018   Procedure: LAPRASCOPIC INSERTION OF GASTROSTOMY TUBE;  Surgeon: Greer Pickerel, MD;  [facility];  Service: General;  Laterality: N/A;  . GASTROSTOMY TUBE PLACEMENT Left    06/2018  . HERNIA REPAIR    . HIATAL HERNIA REPAIR N/A 06/19/2018   Procedure: LAPAROSCOPIC REPAIR OF HIATAL HERNIA;  Surgeon:  Greer Pickerel, MD;  [facility];  Service: General;  Laterality: N/A;  . LAPAROSCOPIC LYSIS OF ADHESIONS  03/19/2019   Dr. Romana Juniper  . LAPAROSCOPY N/A 06/19/2018   Procedure: LAPAROSCOPY DIAGNOSTIC;  Surgeon: Greer Pickerel, MD;  [facility];  Service: General;  Laterality: N/A;  . LAPAROSCOPY N/A 03/19/2019   Procedure: LAPAROSCOPY DIAGNOSTIC lysis of adhesions;  Surgeon: Clovis Riley, MD;  [facility];  Service: General;  Laterality: N/A;  . LUMBAR LAMINECTOMY/DECOMPRESSION MICRODISCECTOMY Right 10/11/2018   Procedure: LAMINECTOMY AND FORAMINOTOMY RIGHT LUMBAR FOUR- LUMBAR FIVE;  Surgeon: Consuella Lose, MD;  Location: [place];  Service: Neurosurgery;  Laterality: Right;  . SHOULDER ARTHROSCOPY  7/12    left-dsc  . TONSILLECTOMY  at age 5  . TRIGGER FINGER RELEASE  12/20/2012   Procedure: RELEASE TRIGGER FINGER/A-1 PULLEY;  Surgeon: Tennis Must, MD;  Location: [place];  Service: Orthopedics;  Laterality: Left;  LEFT TRIGGER THUMB RELEASE    There were no vitals filed for this visit.   Subjective Assessment - 03/06/20 1236    Subjective  COVID-19 screen performed prior to patient entering clinic.  Only got 30 minutes of relief from dry needling.  Patient requesting discharge at this time.    Pertinent History  Lumbar surgery x 2, DM, left shoulder surgery.    Patient Stated Goals  Stop hurting, driving without pain, ADL's without pain    Currently in Pain?  Yes    Pain Score  7     Pain Location  Neck    Pain Descriptors / Indicators  Sharp;Stabbing    Pain Type  Chronic pain    Pain Onset  More than a month ago                    Objective measurements completed on examination: See above findings.      [facility] Adult PT Treatment/Exercise - 03/06/20 0001      Modalities   Modalities  Electrical Stimulation;Moist Heat;Ultrasound      Moist Heat Therapy   Number Minutes Moist Heat  20 Minutes    Moist Heat Location  --   Cervical.     Electrical Stimulation   Electrical Stimulation Location  Bilateral UT's and iupper thoracic musculature.    Electrical Stimulation Action  IFC    Electrical Stimulation Parameters  80-150 Hz x 20 minutes at 100% scan    Electrical Stimulation Goals  Pain      Ultrasound   Ultrasound Location  Bilateral UT's and upper thoracic musculature.    Ultrasound Parameters  U/S at 1.50 W/CM2 x 12 minutes.    Ultrasound Goals  Pain      Manual Therapy   Manual Therapy  Soft tissue mobilization    Soft tissue mobilization  STW/M x 11 minutes to patient's bilateral UT's and upper thoracic musculature.                  PT Long Term Goals - 02/21/20 0943      PT LONG TERM GOAL #1   Title  Independent with a  HEP and progression.    Time  6    Period  Weeks    Status  New    Target Date  04/03/20      PT LONG TERM GOAL #2   Title  Pt will improve FOTO score from 54% limitation to </= 44% limitiation in her cervical spine.    Time  6    Period  Weeks    Status  New    Target Date  04/03/20      PT LONG TERM GOAL #3   Title  Pt will improve her bilateral cervical rotation to >/= 60 degrees in order to improve driving safety.    Baseline  see flow sheets    Time  6    Period  Weeks    Status  New    Target Date  04/03/20      PT LONG TERM GOAL #4   Title  Pt will  be able to lift 5 pound object from counter to over shoulder height with no pain using L UE.    Baseline  increased pain with AROM    Time  6    Period  Weeks    Status  New             Plan - 03/06/20 1242    Clinical Impression Statement  Patient receiving only temporary relief from treatments.  Requesting discharge.    Personal Factors and Comorbidities  Comorbidity 2;Comorbidity 3+    Comorbidities  Lumbar surgery x 2, DM, left shoulder surgery, migraines, OA    Examination-Activity Limitations  Other;Carry;Sit;Reach Overhead    Examination-Participation Restrictions  Driving;Other    Stability/Clinical Decision Making  Stable/Uncomplicated    Rehab Potential  Good    PT Frequency  2x / week    PT Duration  6 weeks    PT Treatment/Interventions  ADLs/Self Care Home Management;Cryotherapy;Electrical Stimulation;Ultrasound;Moist Heat;Iontophoresis 4mg /ml Dexamethasone;Therapeutic exercise;Neuromuscular re-education;Therapeutic activities;Patient/family education;Manual techniques;Passive range of motion;Dry needling;Stair training;Functional mobility training;Taping;Traction    PT Next Visit Plan  cervical ROM, pulleys, UBE, shoulder ROM, STM and E-stim, assess for DN of Upper trap.   [place] only Rx tolerated well today. DN next Rx?    PT Home Exercise Plan  cervical retraciton, upper trap stretch, cervical side  bending    Consulted and Agree with Plan of Care  Patient       Patient will benefit from skilled therapeutic intervention in order to improve the following deficits and impairments:  Pain, Decreased activity tolerance, Decreased range of motion, Decreased strength, Postural dysfunction, Impaired UE functional use  Visit Diagnosis: Chronic left shoulder pain  Cervicalgia  Muscle weakness (generalized)     Problem List Patient Active Problem List   Diagnosis Date Noted  . AMS (altered mental status) 05/19/2019  . Thrombocytopenia ([place]) 05/19/2019  . SBO (small bowel obstruction) s/p lap LOA 03/19/2019 03/19/2019  . Chronic migraine without aura, with intractable migraine, so stated, with status migrainosus 11/20/2018  . Lumbar radiculopathy 10/11/2018  . Atelectasis   . Hypoglycemia   . Hypotension   . Bradycardia   . Epigastric pain 06/19/2018  . History of adenomatous polyp of colon 10/23/2017  . Hyperlipidemia associated with type 2 diabetes mellitus ([place]) 07/06/2017  . GERD (gastroesophageal reflux disease) 03/21/2017  . History of Roux-en-Y gastric bypass 2018 03/21/2017  . Morbid obesity with BMI of 40.0-44.9, adult ([place]) 06/06/2016  . Intractable chronic migraine without aura 06/04/2015  . Abnormal uterine bleeding 05/13/2015  . Pancreatitis 02/27/2014  . Fatty liver disease, nonalcoholic [id number]  . Bipolar disorder ([place]) 02/20/2014  . Metabolic syndrome [id number]  . Vitamin D deficiency   . DM (diabetes mellitus) ([place]) 03/12/2013  . Surgery, elective 06/12/2011  . Hypothyroidism   . Constipation, chronic   . Vitamin B 12 deficiency   . ALLERGIC RHINITIS 12/31/2010  . BARRETTS ESOPHAGUS  12/31/2010  . Gastroparesis 12/31/2010  . Bilateral chronic knee pain 12/31/2010  . Obstructive sleep apnea 09/27/2010  . Migraine 09/27/2010  . RESPIRATORY FAILURE, CHRONIC 09/27/2010  . Cyst of ovary 10/19/2009    Almeter Westhoff, [place] 03/06/2020, 12:44 PM  [facility] [address] [place], [place], [address] Phone: [phone]   Fax:  [phone]  Name: Morgan [name] MRN: [id number] Date of Birth: 12/28/1965

## 2020-03-07 ENCOUNTER — Other Ambulatory Visit: Payer: Self-pay | Admitting: Family Medicine

## 2020-03-09 NOTE — Telephone Encounter (Signed)
Last office visit 02/17/2020 Last refill 02/17/2020, #90, no refills

## 2020-03-18 DIAGNOSIS — M542 Cervicalgia: Secondary | ICD-10-CM | POA: Diagnosis not present

## 2020-03-24 DIAGNOSIS — M5412 Radiculopathy, cervical region: Secondary | ICD-10-CM | POA: Diagnosis not present

## 2020-03-24 DIAGNOSIS — M542 Cervicalgia: Secondary | ICD-10-CM | POA: Diagnosis not present

## 2020-04-03 DIAGNOSIS — M542 Cervicalgia: Secondary | ICD-10-CM | POA: Diagnosis not present

## 2020-04-03 DIAGNOSIS — M5412 Radiculopathy, cervical region: Secondary | ICD-10-CM | POA: Diagnosis not present

## 2020-04-13 ENCOUNTER — Other Ambulatory Visit: Payer: Self-pay | Admitting: Family Medicine

## 2020-04-14 ENCOUNTER — Encounter: Payer: Self-pay | Admitting: Family Medicine

## 2020-04-14 ENCOUNTER — Telehealth (INDEPENDENT_AMBULATORY_CARE_PROVIDER_SITE_OTHER): Payer: Medicare Other | Admitting: Family Medicine

## 2020-04-14 DIAGNOSIS — G43009 Migraine without aura, not intractable, without status migrainosus: Secondary | ICD-10-CM

## 2020-04-14 MED ORDER — RIZATRIPTAN BENZOATE 10 MG PO TBDP: 10.0000 mg | ORAL_TABLET | ORAL | 11 refills | Status: DC | PRN

## 2020-04-14 NOTE — Progress Notes (Addendum)
PATIENT: Morgan [name] DOB: Aug 05, 1966  REASON FOR VISIT: follow up HISTORY FROM: patient  Virtual Visit via Telephone Note  I connected with Morgan [name] on 04/14/20 at  8:30 AM EDT by telephone and verified that I am speaking with the correct person using two identifiers.   I discussed the limitations, risks, security and privacy concerns of performing an evaluation and management service by telephone and the availability of in person appointments. I also discussed with the patient that there may be a patient responsible charge related to this service. The patient expressed understanding and agreed to proceed.   History of Present Illness:  04/14/20 Morgan [name] is a 54 y.o. female here today for follow up of migraines.  She continues divalproex and gabapentin as prescribed.  We switched her to [facility] in February she reports that headaches have decreased significantly in intensity and frequency.  She had 4 headaches in the month of April.  She continues to follow with orthopedics closely for neck pain.  She has been dissipated in physical therapy and received 1 steroid injection.  She does not feel that either were very beneficial.  She has tried dry needling with no success.  History (copied from my note on 01/14/2020)  Morgan [name] is a 54 y.o. female here today for follow up.She continues divalproex 500mg  and 1000mg  at night, gabapentin 600mg  in the am, 300mg  at lunch and 600mg  at bedtime and Ajovy monthly. She does feel that headaches are less severe but continue daily. She feels that pounding headaches are much less severe. She is tolerating medications well, however, insurance is requiring that she try Emgality. They will no longer pay for Ajovy. Insurance will cover [employer]. She is willing to switch medications. She continues to have chronic neck pain managed by orthopedics at this time. She is current treating with steroid injections.   History (copied from Dr  Morgan [name] note on 05/22/2019)  Interval history: 24 headache days a month. Most are migraines. 15 are severe. Ajovy may be helping a little, she has had so much going on she doesn't know if that is contributing. She had emergency surgery in April and recent back surgery. She is taking oxycontin and 325mg  tylenol. She will keep on the Ajovy and when all the issues are calmed down she will call us and discuss nerve blocks.    Observations/Objective:  Generalized: Well developed, in no acute distress  Mentation: Alert oriented to time, place, history taking. Follows all commands speech and language fluent   Assessment and Plan:  54 y.o. year old female  has a past medical history of Allergic rhinitis, Anemia, Anxiety, Barrett's esophagus, Bipolar affective disorder ([place]), CAP (community acquired pneumonia) (07/20/2018), Chronic respiratory failure ([place]), Complication of anesthesia, Constipation, chronic, Degenerative joint disease of spine, Depression, Diabetes mellitus without complication ([place]), DM (diabetes mellitus) ([place]), Dry eye, Family history of adverse reaction to anesthesia, Gastroparesis, GERD (gastroesophageal reflux disease), History of bariatric surgery (03/2017), History of hiatal hernia, HLD (hyperlipidemia) (03/12/2013), Hyperlipidemia, Hypertension, Intractable chronic migraine without aura (06/04/2015), Migraine headache, Morbid obesity ([place]), OSA (obstructive sleep apnea), Osteoarthritis, SBO (small bowel obstruction) ([place]) (03/19/2019), Sleep apnea, Unspecified hypothyroidism, Vitamin B 12 deficiency, and Vitamin D deficiency. here with    ICD-10-CM   1. Migraine without aura and without status migrainosus, not intractable  G43.009    Morgan [name] is doing much better from a headache standpoint.  She will continue divalproex 500 mg in the morning and 1000 mg at night as  well as gabapentin 6 mg in the morning and 300 mg at lunch and 600 mg at bedtime.  We will continue Emgality monthly.  Tylenol  is no longer working for abortive therapy.  We will try rizatriptan 10 mg as needed.  She was advised to take 1 tablet at the onset of a headache.  She may take an additional tablet 2 hours later if needed but advised to avoid more than 2 tablets in 24 hours or 9 tablets a month.  She will continue to stay well-hydrated.  She will focus on healthy lifestyle habits.  She will continue close follow-up with orthopedics for continuing pain.  She will follow-up with me in 6 months.  Appointment has been scheduled.  She verbalizes understanding and agreement with this plan.   No orders of the defined types were placed in this encounter.   No orders of the defined types were placed in this encounter.    Follow Up Instructions:  I discussed the assessment and treatment plan with the patient. The patient was provided an opportunity to ask questions and all were answered. The patient agreed with the plan and demonstrated an understanding of the instructions.   The patient was advised to call back or seek an in-person evaluation if the symptoms worsen or if the condition fails to improve as anticipated.  I provided 20 minutes of non-face-to-face time during this encounter. Patient is located at her place of residence during Morgan [name] visit. Provider located in the office.    Debbora Presto, NP   Made any corrections needed, and agree with history, physical, neuro exam,assessment and plan as stated.     Sarina Ill, MD [facility]

## 2020-04-21 ENCOUNTER — Encounter: Payer: Self-pay | Admitting: Family Medicine

## 2020-04-21 ENCOUNTER — Other Ambulatory Visit: Payer: Self-pay | Admitting: *Deleted

## 2020-04-21 MED ORDER — GABAPENTIN 300 MG PO CAPS: ORAL_CAPSULE | ORAL | 0 refills | Status: DC

## 2020-04-23 DIAGNOSIS — M542 Cervicalgia: Secondary | ICD-10-CM | POA: Diagnosis not present

## 2020-04-23 DIAGNOSIS — M5412 Radiculopathy, cervical region: Secondary | ICD-10-CM | POA: Diagnosis not present

## 2020-05-01 DIAGNOSIS — M5412 Radiculopathy, cervical region: Secondary | ICD-10-CM | POA: Diagnosis not present

## 2020-05-01 DIAGNOSIS — M542 Cervicalgia: Secondary | ICD-10-CM | POA: Diagnosis not present

## 2020-05-04 DIAGNOSIS — Z9884 Bariatric surgery status: Secondary | ICD-10-CM | POA: Diagnosis not present

## 2020-05-04 DIAGNOSIS — E162 Hypoglycemia, unspecified: Secondary | ICD-10-CM | POA: Diagnosis not present

## 2020-05-04 DIAGNOSIS — R109 Unspecified abdominal pain: Secondary | ICD-10-CM | POA: Diagnosis not present

## 2020-05-04 DIAGNOSIS — E161 Other hypoglycemia: Secondary | ICD-10-CM | POA: Diagnosis not present

## 2020-05-04 DIAGNOSIS — G8929 Other chronic pain: Secondary | ICD-10-CM | POA: Diagnosis not present

## 2020-05-06 ENCOUNTER — Other Ambulatory Visit: Payer: Self-pay | Admitting: *Deleted

## 2020-05-06 ENCOUNTER — Other Ambulatory Visit: Payer: Self-pay | Admitting: Family Medicine

## 2020-05-06 DIAGNOSIS — K219 Gastro-esophageal reflux disease without esophagitis: Secondary | ICD-10-CM

## 2020-05-06 DIAGNOSIS — E039 Hypothyroidism, unspecified: Secondary | ICD-10-CM

## 2020-05-06 MED ORDER — PANTOPRAZOLE SODIUM 40 MG PO TBEC: 40.0000 mg | DELAYED_RELEASE_TABLET | Freq: Every day | ORAL | 2 refills | Status: DC

## 2020-05-25 ENCOUNTER — Encounter: Payer: Self-pay | Admitting: Family Medicine

## 2020-05-25 ENCOUNTER — Ambulatory Visit (INDEPENDENT_AMBULATORY_CARE_PROVIDER_SITE_OTHER): Payer: Medicare Other | Admitting: Family Medicine

## 2020-05-25 ENCOUNTER — Other Ambulatory Visit: Payer: Self-pay

## 2020-05-25 VITALS — BP 119/85 | HR 71 | Temp 97.6°F | Ht 64.0 in | Wt 177.0 lb

## 2020-05-25 DIAGNOSIS — E1169 Type 2 diabetes mellitus with other specified complication: Secondary | ICD-10-CM | POA: Diagnosis not present

## 2020-05-25 DIAGNOSIS — E1159 Type 2 diabetes mellitus with other circulatory complications: Secondary | ICD-10-CM

## 2020-05-25 DIAGNOSIS — Z1159 Encounter for screening for other viral diseases: Secondary | ICD-10-CM

## 2020-05-25 DIAGNOSIS — E119 Type 2 diabetes mellitus without complications: Secondary | ICD-10-CM | POA: Diagnosis not present

## 2020-05-25 DIAGNOSIS — I10 Essential (primary) hypertension: Secondary | ICD-10-CM | POA: Diagnosis not present

## 2020-05-25 DIAGNOSIS — E785 Hyperlipidemia, unspecified: Secondary | ICD-10-CM | POA: Diagnosis not present

## 2020-05-25 DIAGNOSIS — I152 Hypertension secondary to endocrine disorders: Secondary | ICD-10-CM

## 2020-05-25 LAB — BAYER DCA HB A1C WAIVED: HB A1C (BAYER DCA - WAIVED): 5.5 % (ref <7.0)

## 2020-05-25 NOTE — Progress Notes (Signed)
Subjective: CC: f/u HLD, DM, HTN PCP: Janora Norlander, DO IRJ:JOACZ R Yager is a 54 y.o. female presenting to clinic today for:  1. Diet controlled type 2 Diabetes w/ hx HTN and HLD; history of gastric bypass She continues to have intermittent episodes of hypoglycemia.  In fact she notes that she had an episode to 29 recently after consuming waffles with syrup.  She is avoided cereal as this seems to exacerbate hypoglycemic episodes.  She spoke to her specialist at [facility] who did mention potentially starting her on medications but she still wants to try and keep the sugar up with diet alone as this is an infrequent occurrence.  She is not lost any consciousness.  Last eye exam: Had eye exam done in June Last foot exam: 08/2019 Last A1c:  Lab Results  Component Value Date   HGBA1C 5.4 11/19/2019   Nephropathy screen indicated?:  Up-to-date Last flu, zoster and/or pneumovax: Up-to-date Immunization History  Administered Date(s) Administered  . Influenza Inj Mdck Quad With Preservative 09/11/2017  . Influenza,inj,Quad PF,6+ Mos 09/29/2016, 09/07/2017, 09/11/2018  . Influenza,inj,quad, With Preservative 09/17/2019  . Influenza-Unspecified 09/11/2016  . Pneumococcal Polysaccharide-23 06/29/2016  . Td 07/09/2015  . Tdap 07/09/2015  . Zoster Recombinat (Shingrix) 10/06/2017, 01/04/2018    ROS: No chest pain, shortness of breath, dizziness, falls.  2.  Neck pain Patient continues to follow-up with orthopedics for her neck.  She has had 2 corticosteroid shots within the C-spine and was recently placed on Celebrex.  She continues to have quite a bit of pain and notes that this radiates to her left upper extremity causing muscle spasm in that arm.  She is using Flexeril but again is not finding much relief.  Apparently she is not a surgical candidate as her disc spaces look fairly well-preserved.  She has follow-up with them tomorrow.  ROS: Per HPI  Allergies  Allergen Reactions    . Ketoconazole Hives and Swelling    SWELLING REACTION UNSPECIFIED   . Pravachol [Pravastatin Sodium] Shortness Of Breath, Swelling and Anaphylaxis    Throat swelling  . Statins Other (See Comments)    Leg cramps  . Tape Dermatitis and Other (See Comments)    Steri-Strips  . Codeine Hypertension    increased BP   Past Medical History:  Diagnosis Date  . Allergic rhinitis   . Anemia     after gastric bypass in 2018  . Anxiety   . Barrett's esophagus   . Bipolar affective disorder ([place])   . CAP (community acquired pneumonia) 07/20/2018  . Chronic respiratory failure ([place])   . Complication of anesthesia    "they had a hard time keeping my blood sugar up after back surgery in April 2020" per pt  . Constipation, chronic   . Degenerative joint disease of spine   . Depression   . Diabetes mellitus without complication ([place])    type 2   . DM (diabetes mellitus) ([place])    "hypoglycemic" per pt - no longer takes DM meds due to weight loss from gastric bypass in 2018  . Dry eye   . Family history of adverse reaction to anesthesia    nausea and vomiting  . Gastroparesis   . GERD (gastroesophageal reflux disease)   . History of bariatric surgery 03/2017  . History of hiatal hernia   . HLD (hyperlipidemia) 03/12/2013  . Hyperlipidemia   . Hypertension    No HTN meds since weight loss from Gastric Bypass in 2018  .  Intractable chronic migraine without aura 06/04/2015  . Migraine headache   . Morbid obesity ([place])   . OSA (obstructive sleep apnea)    No cpap since gastric surgery  . Osteoarthritis    bilateral knee  . SBO (small bowel obstruction) ([place]) 03/19/2019  . Sleep apnea   . Unspecified hypothyroidism   . Vitamin B 12 deficiency   . Vitamin D deficiency     Current Outpatient Medications:  .  acetaminophen (TYLENOL) 500 MG tablet, Take 1,000 mg by mouth every 4 (four) hours as needed for moderate pain., Disp: , Rfl:  .  azelastine (ASTELIN) 0.1 % nasal spray, Place 1 spray  into both nostrils 2 (two) times daily., Disp: 30 mL, Rfl: 12 .  Calcium-Vitamin D-Vitamin K 650-12.5-40 MG-MCG-MCG CHEW, Chew 1 tablet by mouth 2 (two) times daily., Disp: , Rfl:  .  dicyclomine (BENTYL) 20 MG tablet, TAKE 1 TABLET EVERY 8 HOURS AS NEEDED FOR ABDOMINAL CRAMPING, Disp: 90 tablet, Rfl: 0 .  divalproex (DEPAKOTE) 500 MG DR tablet, TAKE 1 TABLET EVERY MORNING AND 2 TABLETS AT BEDTIME, Disp: 90 tablet, Rfl: 5 .  docusate sodium (COLACE) 100 MG capsule, Take 1 capsule (100 mg total) by mouth 2 (two) times daily., Disp: 30 capsule, Rfl: 0 .  fluticasone (FLONASE) 50 MCG/ACT nasal spray, USE 2 SPRAYS IN EACH NOSTRIL TWICE DAILY AS NEEDED FOR ALLERGY, Disp: 16 g, Rfl: 4 .  gabapentin (NEURONTIN) 300 MG capsule, Takes 2 capsules in morning, one cap at lunch, 2 caps at bedtime, Disp: 450 capsule, Rfl: 0 .  Galcanezumab-gnlm (EMGALITY) 120 MG/ML SOAJ, Inject 120 mg into the skin every 30 (thirty) days., Disp: 1 pen, Rfl: 11 .  Glucagon, rDNA, (GLUCAGON EMERGENCY IJ), Inject 1 Syringe as directed as needed (emergency low blood sugar)., Disp: , Rfl:  .  glucose blood (ONE TOUCH ULTRA TEST) test strip, CHECK BLOOD SUGAR FOUR TIMES A DAY.  Dx E16.2 E11.649, Disp: 400 each, Rfl: 3 .  levothyroxine (SYNTHROID) 112 MCG tablet, TAKE 1 TABLET BEFORE BREAKFAST, Disp: 90 tablet, Rfl: 3 .  LINZESS 145 MCG CAPS capsule, , Disp: , Rfl:  .  loratadine (CLARITIN) 10 MG tablet, Take 10 mg by mouth daily as needed for allergies. , Disp: , Rfl:  .  Multiple Vitamin (MULTIVITAMIN) capsule, Take 1 capsule by mouth daily. , Disp: , Rfl:  .  nystatin (MYCOSTATIN/NYSTOP) powder, Use twice daily prn groin rash. x7-10d per flare.  Please make sure she gets POWDER, Disp: 30 g, Rfl: 1 .  ondansetron (ZOFRAN) 4 MG tablet, TAKE  (1)  TABLET  EVERY EIGHT HOURS AS NEEDED., Disp: 90 tablet, Rfl: 0 .  pantoprazole (PROTONIX) 40 MG tablet, Take 1 tablet (40 mg total) by mouth daily., Disp: 90 tablet, Rfl: 2 .  PAZEO 0.7 % SOLN,  Place 1 drop into both eyes every morning., Disp: , Rfl:  .  polyethylene glycol (MIRALAX / GLYCOLAX) packet, Take 17 g by mouth at bedtime. , Disp: , Rfl:  .  Probiotic Product (PROBIOTIC DAILY PO), Take 1 capsule by mouth daily., Disp: , Rfl:  .  RESTASIS MULTIDOSE 0.05 % ophthalmic emulsion, Place 1 drop into both eyes 2 (two) times daily., Disp: , Rfl:  .  rizatriptan (MAXALT-MLT) 10 MG disintegrating tablet, Take 1 tablet (10 mg total) by mouth as needed for migraine. May repeat in 2 hours if needed, Disp: 9 tablet, Rfl: 11 .  sertraline (ZOLOFT) 100 MG tablet, Take 2 tablets (200 mg total) by  mouth daily., Disp: 180 tablet, Rfl: 3 .  sucralfate (CARAFATE) 1 GM/10ML suspension, SMARTSIG:2 Teaspoon By Mouth 3 Times Daily, Disp: , Rfl:  .  tiZANidine (ZANAFLEX) 2 MG tablet, Take 1 tablet (2 mg total) by mouth every 8 (eight) hours as needed for muscle spasms., Disp: 60 tablet, Rfl: 6 .  ziprasidone (GEODON) 60 MG capsule, Take 1 capsule (60 mg total) by mouth at bedtime., Disp: 90 capsule, Rfl: 1  Current Facility-Administered Medications:  .  cyanocobalamin ((VITAMIN B-12)) injection 1,000 mcg, 1,000 mcg, Intramuscular, Q30 days, Buena Boehm M, DO, 1,000 mcg at 02/17/20 1449   Social History   Socioeconomic History  . Marital status: Single    Spouse name: Not on file  . Number of children: 0  . Years of education: HS  . Highest education level: 12th grade  Occupational History  . Occupation: disabled    Fish farm manager: UNEMPLOYED  Tobacco Use  . Smoking status: Never Smoker  . Smokeless tobacco: Never Used  Vaping Use  . Vaping Use: Never used  Substance and Sexual Activity  . Alcohol use: No  . Drug use: No  . Sexual activity: Never    Birth control/protection: Post-menopausal    Comment: LMP 10/2017  Other Topics Concern  . Not on file  Social History Narrative   Patient is right handed.   Patient drinks 2 glasses of caffeine daily.   Lives at home. Her niece is living  with her right now.   Social Determinants of Health   Financial Resource Strain: Low Risk   . Difficulty of Paying Living Expenses: Not very hard  Food Insecurity:   . Worried About Charity fundraiser in the Last Year:   . Arboriculturist in the Last Year:   Transportation Needs: No Transportation Needs  . Lack of Transportation (Medical): No  . Lack of Transportation (Non-Medical): No  Physical Activity: Inactive  . Days of Exercise per Week: 0 days  . Minutes of Exercise per Session: 0 min  Stress:   . Feeling of Stress :   Social Connections: Moderately Integrated  . Frequency of Communication with Friends and Family: More than three times a week  . Frequency of Social Gatherings with Friends and Family: More than three times a week  . Attends Religious Services: More than 4 times per year  . Active Member of Clubs or Organizations: Yes  . Attends Archivist Meetings: More than 4 times per year  . Marital Status: Never married  Intimate Partner Violence: Not At Risk  . Fear of Current or Ex-Partner: No  . Emotionally Abused: No  . Physically Abused: No  . Sexually Abused: No   Family History  Problem Relation Age of Onset  . Asthma Father   . Allergies Father   . Heart disease Father        enlarged heart  . Peripheral vascular disease Father   . Diabetes Father   . Hyperlipidemia Father   . Arthritis Father   . Asthma Sister   . Cancer Sister        colon at 43 yr old.  . Colon cancer Sister   . Allergies Mother   . Stroke Mother 24       with hemi paralysis  . Diabetes Mother   . Hyperlipidemia Mother   . Hypertension Mother   . GI problems Mother   . Arthritis Mother   . Allergies Brother   . Early death Brother  9       congenital abormality  . Allergies Sister   . Diabetes Sister   . Asthma Sister   . Colon polyps Sister   . Hyperlipidemia Sister   . GI problems Sister        gastroporesis   . Liver disease Sister        fatty liver  .  Stroke Sister        intercrandial bleed  . Diabetes Brother   . Hypertension Brother   . Hyperlipidemia Brother   . Heart disease Paternal Aunt   . Migraines Neg Hx     Objective: Office vital signs reviewed. BP 119/85   Pulse 71   Temp 97.6 F (36.4 C) (Temporal)   Ht 5\' 4"  (1.626 m)   Wt 177 lb (80.3 kg)   LMP 10/17/2017 (Approximate)   SpO2 97%   BMI 30.38 kg/m   Physical Examination:  General: Awake, alert, well appearing. No acute distress HEENT: Normal, sclera white.  MMM Cardio: regular rate and rhythm, S1S2 heard, no murmurs appreciated Pulm: clear to auscultation bilaterally, no wheezes, rhonchi or rales; normal work of breathing on room air Extremities: warm, well perfused, No edema, cyanosis or clubbing; +2 pulses bilaterally Skin: Normal temperature MSK:   C-spine: She has full active range of motion in all planes but does have pain with extension of the neck.  Full active range of motion of bilateral upper extremities  Assessment/ Plan: 54 y.o. female   1. Hyperlipidemia associated with type 2 diabetes mellitus (HCC) Check lipid panel - Lipid Panel - Hepatic Function Panel  2. Hypertension associated with diabetes ([place]) Check potassium.  We will plan to CC results to Dr. Oren Section Anderson [place]) with [facility] - Potassium  3. Diet-controlled diabetes mellitus (HCC) A1c still controlled at 5.5 - Bayer DCA Hb A1c Waived  4. Encounter for hepatitis C screening test for low risk patient - Hepatitis C antibody   No orders of the defined types were placed in this encounter.   No orders of the defined types were placed in this encounter.   Janora Norlander, DO [facility] [phone]

## 2020-05-26 DIAGNOSIS — M542 Cervicalgia: Secondary | ICD-10-CM | POA: Diagnosis not present

## 2020-05-26 DIAGNOSIS — M791 Myalgia, unspecified site: Secondary | ICD-10-CM | POA: Diagnosis not present

## 2020-05-26 DIAGNOSIS — M5412 Radiculopathy, cervical region: Secondary | ICD-10-CM | POA: Diagnosis not present

## 2020-05-26 LAB — LIPID PANEL
Chol/HDL Ratio: 3.2 ratio (ref 0.0–4.4)
Cholesterol, Total: 194 mg/dL (ref 100–199)
HDL: 60 mg/dL (ref >39)
LDL Chol Calc (NIH): 118 mg/dL — ABNORMAL HIGH (ref 0–99)
Triglycerides: 89 mg/dL (ref 0–149)
VLDL Cholesterol Cal: 16 mg/dL (ref 5–40)

## 2020-05-26 LAB — POTASSIUM: Potassium: 4.4 mmol/L (ref 3.5–5.2)

## 2020-05-26 LAB — HEPATIC FUNCTION PANEL
ALT: 20 IU/L (ref 0–32)
AST: 24 IU/L (ref 0–40)
Albumin: 4 g/dL (ref 3.8–4.9)
Alkaline Phosphatase: 75 IU/L (ref 48–121)
Bilirubin Total: <0.2 mg/dL (ref 0.0–1.2)
Bilirubin, Direct: 0.05 mg/dL (ref 0.00–0.40)
Total Protein: 6 g/dL (ref 6.0–8.5)

## 2020-05-26 LAB — HEPATITIS C ANTIBODY: Hep C Virus Ab: <0.1 s/co ratio (ref 0.0–0.9)

## 2020-05-29 ENCOUNTER — Other Ambulatory Visit: Payer: Self-pay | Admitting: Family Medicine

## 2020-06-01 DIAGNOSIS — M5412 Radiculopathy, cervical region: Secondary | ICD-10-CM | POA: Diagnosis not present

## 2020-06-01 DIAGNOSIS — M542 Cervicalgia: Secondary | ICD-10-CM | POA: Diagnosis not present

## 2020-06-08 ENCOUNTER — Other Ambulatory Visit: Payer: Self-pay | Admitting: Family Medicine

## 2020-06-08 DIAGNOSIS — E11649 Type 2 diabetes mellitus with hypoglycemia without coma: Secondary | ICD-10-CM

## 2020-06-08 DIAGNOSIS — E162 Hypoglycemia, unspecified: Secondary | ICD-10-CM

## 2020-06-12 ENCOUNTER — Other Ambulatory Visit: Payer: Self-pay

## 2020-06-12 MED ORDER — CELECOXIB 100 MG PO CAPS: 100.0000 mg | ORAL_CAPSULE | Freq: Every day | ORAL | 2 refills | Status: DC

## 2020-06-23 DIAGNOSIS — M25512 Pain in left shoulder: Secondary | ICD-10-CM | POA: Diagnosis not present

## 2020-06-24 DIAGNOSIS — M25512 Pain in left shoulder: Secondary | ICD-10-CM | POA: Diagnosis not present

## 2020-07-01 LAB — HM DIABETES EYE EXAM

## 2020-07-03 DIAGNOSIS — M19012 Primary osteoarthritis, left shoulder: Secondary | ICD-10-CM | POA: Diagnosis not present

## 2020-07-07 ENCOUNTER — Other Ambulatory Visit: Payer: Self-pay | Admitting: Family Medicine

## 2020-07-07 DIAGNOSIS — F3170 Bipolar disorder, currently in remission, most recent episode unspecified: Secondary | ICD-10-CM

## 2020-07-13 ENCOUNTER — Encounter: Payer: Self-pay | Admitting: Family Medicine

## 2020-07-13 MED ORDER — GABAPENTIN 300 MG PO CAPS: ORAL_CAPSULE | ORAL | 1 refills | Status: DC

## 2020-07-13 NOTE — Telephone Encounter (Signed)
Done

## 2020-08-03 NOTE — Patient Instructions (Addendum)
DUE TO COVID-19 ONLY ONE VISITOR IS ALLOWED TO COME WITH YOU AND STAY IN THE WAITING ROOM ONLY DURING PRE OP AND PROCEDURE DAY OF SURGERY. THE 1 VISITOR  MAY VISIT WITH YOU AFTER SURGERY IN YOUR PRIVATE ROOM DURING VISITING HOURS ONLY!  YOU NEED TO HAVE A COVID 19 TEST ON: 08/07/20 @ 9:30 am, THIS TEST MUST BE DONE BEFORE SURGERY,  COVID TESTING SITE [place] [address], IT IS ON THE RIGHT GOING OUT [address] APPROXIMATELY  2 MINUTES PAST [employer] ON THE RIGHT. ONCE YOUR COVID TEST IS COMPLETED,  PLEASE BEGIN THE QUARANTINE INSTRUCTIONS AS OUTLINED IN YOUR HANDOUT.                Morgan [name]    Your procedure is scheduled on: 08/11/20   Report to [facility] Main  Entrance   Report to short stay at: 5:30 AM     Call this number if you have problems the morning of surgery [phone]    Remember:   NO SOLID FOOD AFTER MIDNIGHT THE NIGHT PRIOR TO SURGERY. NOTHING BY MOUTH EXCEPT CLEAR LIQUIDS UNTIL: 4:30 am . PLEASE FINISH ENSURE DRINK PER SURGEON ORDER  WHICH NEEDS TO BE COMPLETED AT: 4:30 am .  CLEAR LIQUID DIET   Foods Allowed                                                                     Foods Excluded  Coffee and tea, regular and decaf                             liquids that you cannot  Plain Jell-O any favor except red or purple                                           see through such as: Fruit ices (not with fruit pulp)                                     milk, soups, orange juice  Iced Popsicles                                    All solid food Carbonated beverages, regular and diet                                    Cranberry, grape and apple juices Sports drinks like Gatorade Lightly seasoned clear broth or consume(fat free) Sugar, honey syrup  Sample Menu Breakfast                                Lunch  Supper Cranberry juice                    Beef broth                             Chicken broth Jell-O                                     Grape juice                           Apple juice Coffee or tea                        Jell-O                                      Popsicle                                                Coffee or tea                        Coffee or tea  _____________________________________________________________________   BRUSH YOUR TEETH MORNING OF SURGERY AND RINSE YOUR MOUTH OUT, NO CHEWING GUM CANDY OR MINTS.     Take these medicines the morning of surgery with A SIP OF WATER: Depakote,gabapentin,levothyroxine,pantoprazole,sertraline,loratadine.Use Flonase and eye drops as usual.  How to Manage Your Diabetes Before and After Surgery  Why is it important to control my blood sugar before and after surgery? . Improving blood sugar levels before and after surgery helps healing and can limit problems. . A way of improving blood sugar control is eating a healthy diet by: o  Eating less sugar and carbohydrates o  Increasing activity/exercise o  Talking with your doctor about reaching your blood sugar goals . High blood sugars (greater than 180 mg/dL) can raise your risk of infections and slow your recovery, so you will need to focus on controlling your diabetes during the weeks before surgery. . Make sure that the doctor who takes care of your diabetes knows about your planned surgery including the date and location.  How do I manage my blood sugar before surgery? . Check your blood sugar at least 4 times a day, starting 2 days before surgery, to make sure that the level is not too high or low. o Check your blood sugar the morning of your surgery when you wake up and every 2 hours until you get to the Short Stay unit. . If your blood sugar is less than 70 mg/dL, you will need to treat for low blood sugar: o Do not take insulin. o Treat a low blood sugar (less than 70 mg/dL) with  cup of clear juice (cranberry or apple), 4 glucose tablets,  OR glucose gel. o Recheck blood sugar in 15 minutes after treatment (to make sure it is greater than 70 mg/dL). If your blood sugar is not greater than 70 mg/dL on recheck, call [phone] for further instructions. . Report your blood sugar to the short stay nurse when you get to  Short Stay.  . If you are admitted to the hospital after surgery: o Your blood sugar will be checked by the staff and you will probably be given insulin after surgery (instead of oral diabetes medicines) to make sure you have good blood sugar levels. o The goal for blood sugar control after surgery is 80-180 mg/dL.                                 You may not have any metal on your body including hair pins and              piercings  Do not wear jewelry, make-up, lotions, powders or perfumes, deodorant             Do not wear nail polish on your fingernails.  Do not shave  48 hours prior to surgery.     Do not bring valuables to the hospital. [place].  Contacts, dentures or bridgework may not be worn into surgery.  Leave suitcase in the car. After surgery it may be brought to your room.     Patients discharged the day of surgery will not be allowed to drive home. IF YOU ARE HAVING SURGERY AND GOING HOME THE SAME DAY, YOU MUST HAVE AN ADULT TO DRIVE YOU HOME AND BE WITH YOU FOR 24 HOURS. YOU MAY GO HOME BY TAXI OR UBER OR ORTHERWISE, BUT AN ADULT MUST ACCOMPANY YOU HOME AND STAY WITH YOU FOR 24 HOURS.  Name and phone number of your driver:  Special Instructions: N/A              Please read over the following fact sheets you were given: _____________________________________________________________________             [facility]- Preparing for Total Shoulder Arthroplasty    Before surgery, you can play an important role. Because skin is not sterile, your skin needs to be as free of germs as possible. You can reduce the number of germs on your skin by using  the following products. . Benzoyl Peroxide Gel o Reduces the number of germs present on the skin o Applied twice a day to shoulder area starting two days before surgery    ==================================================================  Please follow these instructions carefully:  BENZOYL PEROXIDE 5% GEL  Please do not use if you have an allergy to benzoyl peroxide.   If your skin becomes reddened/irritated stop using the benzoyl peroxide.  Starting two days before surgery, apply as follows: 1. Apply benzoyl peroxide in the morning and at night. Apply after taking a shower. If you are not taking a shower clean entire shoulder front, back, and side along with the armpit with a clean wet washcloth.  2. Place a quarter-sized dollop on your shoulder and rub in thoroughly, making sure to cover the front, back, and side of your shoulder, along with the armpit.   2 days before ____ AM   ____ PM              1 day before ____ AM   ____ PM                         3. Do this twice a day for two days.  (Last application is the night before surgery, AFTER  using the CHG soap as described below).  4. Do NOT apply benzoyl peroxide gel on the day of surgery.  [place] - Preparing for Surgery Before surgery, you can play an important role.  Because skin is not sterile, your skin needs to be as free of germs as possible.  You can reduce the number of germs on your skin by washing with CHG (chlorahexidine gluconate) soap before surgery.  CHG is an antiseptic cleaner which kills germs and bonds with the skin to continue killing germs even after washing. Please DO NOT use if you have an allergy to CHG or antibacterial soaps.  If your skin becomes reddened/irritated stop using the CHG and inform your nurse when you arrive at Short Stay. Do not shave (including legs and underarms) for at least 48 hours prior to the first CHG shower.  You may shave your face/neck. Please follow these instructions  carefully:  1.  Shower with CHG Soap the night before surgery and the  morning of Surgery.  2.  If you choose to wash your hair, wash your hair first as usual with your  normal  shampoo.  3.  After you shampoo, rinse your hair and body thoroughly to remove the  shampoo.                           4.  Use CHG as you would any other liquid soap.  You can apply chg directly  to the skin and wash                       Gently with a scrungie or clean washcloth.  5.  Apply the CHG Soap to your body ONLY FROM THE NECK DOWN.   Do not use on face/ open                           Wound or open sores. Avoid contact with eyes, ears mouth and genitals (private parts).                       Wash face,  Genitals (private parts) with your normal soap.             6.  Wash thoroughly, paying special attention to the area where your surgery  will be performed.  7.  Thoroughly rinse your body with warm water from the neck down.  8.  DO NOT shower/wash with your normal soap after using and rinsing off  the CHG Soap.                9.  Pat yourself dry with a clean towel.            10.  Wear clean pajamas.            11.  Place clean sheets on your bed the night of your first shower and do not  sleep with pets. Day of Surgery : Do not apply any lotions/deodorants the morning of surgery.  Please wear clean clothes to the hospital/surgery center.  FAILURE TO FOLLOW THESE INSTRUCTIONS MAY RESULT IN THE CANCELLATION OF YOUR SURGERY PATIENT SIGNATURE_________________________________  NURSE SIGNATURE__________________________________  ________________________________________________________________________   Adam Phenix  An incentive spirometer is a tool that can help keep your lungs clear and active. This tool measures how well you are filling your lungs with each breath. Taking long deep breaths  may help reverse or decrease the chance of developing breathing (pulmonary) problems (especially infection)  following:  A long period of time when you are unable to move or be active. BEFORE THE PROCEDURE   If the spirometer includes an indicator to show your best effort, your nurse or respiratory therapist will set it to a desired goal.  If possible, sit up straight or lean slightly forward. Try not to slouch.  Hold the incentive spirometer in an upright position. INSTRUCTIONS FOR USE  1. Sit on the edge of your bed if possible, or sit up as far as you can in bed or on a chair. 2. Hold the incentive spirometer in an upright position. 3. Breathe out normally. 4. Place the mouthpiece in your mouth and seal your lips tightly around it. 5. Breathe in slowly and as deeply as possible, raising the piston or the ball toward the top of the column. 6. Hold your breath for 3-5 seconds or for as long as possible. Allow the piston or ball to fall to the bottom of the column. 7. Remove the mouthpiece from your mouth and breathe out normally. 8. Rest for a few seconds and repeat Steps 1 through 7 at least 10 times every 1-2 hours when you are awake. Take your time and take a few normal breaths between deep breaths. 9. The spirometer may include an indicator to show your best effort. Use the indicator as a goal to work toward during each repetition. 10. After each set of 10 deep breaths, practice coughing to be sure your lungs are clear. If you have an incision (the cut made at the time of surgery), support your incision when coughing by placing a pillow or rolled up towels firmly against it. Once you are able to get out of bed, walk around indoors and cough well. You may stop using the incentive spirometer when instructed by your caregiver.  RISKS AND COMPLICATIONS  Take your time so you do not get dizzy or light-headed.  If you are in pain, you may need to take or ask for pain medication before doing incentive spirometry. It is harder to take a deep breath if you are having pain. AFTER USE  Rest and  breathe slowly and easily.  It can be helpful to keep track of a log of your progress. Your caregiver can provide you with a simple table to help with this. If you are using the spirometer at home, follow these instructions: [place] IF:   You are having difficultly using the spirometer.  You have trouble using the spirometer as often as instructed.  Your pain medication is not giving enough relief while using the spirometer.  You develop fever of 100.5 F (38.1 C) or higher. SEEK IMMEDIATE MEDICAL CARE IF:   You cough up bloody sputum that had not been present before.  You develop fever of 102 F (38.9 C) or greater.  You develop worsening pain at or near the incision site. MAKE SURE YOU:   Understand these instructions.  Will watch your condition.  Will get help right away if you are not doing well or get worse. Document Released: 04/10/2007 Document Revised: 02/20/2012 Document Reviewed: 06/11/2007 [facility] Patient Information 2014 [place], [place].   ________________________________________________________________________

## 2020-08-04 ENCOUNTER — Other Ambulatory Visit: Payer: Self-pay

## 2020-08-04 ENCOUNTER — Encounter (HOSPITAL_COMMUNITY)
Admission: RE | Admit: 2020-08-04 | Discharge: 2020-08-04 | Disposition: A | Discharge status: Home / Self Care | Payer: Medicare Other | Source: Ambulatory Visit | Attending: Orthopedic Surgery | Admitting: Orthopedic Surgery

## 2020-08-04 ENCOUNTER — Encounter (HOSPITAL_COMMUNITY): Payer: Self-pay

## 2020-08-04 DIAGNOSIS — Z01818 Encounter for other preprocedural examination: Secondary | ICD-10-CM | POA: Insufficient documentation

## 2020-08-04 LAB — BASIC METABOLIC PANEL
Anion gap: 8 (ref 5–15)
BUN: 20 mg/dL (ref 6–20)
CO2: 32 mmol/L (ref 22–32)
Calcium: 8.5 mg/dL — ABNORMAL LOW (ref 8.9–10.3)
Chloride: 99 mmol/L (ref 98–111)
Creatinine, Ser: 0.61 mg/dL (ref 0.44–1.00)
GFR calc Af Amer: >60 mL/min (ref >60)
GFR calc non Af Amer: >60 mL/min (ref >60)
Glucose, Bld: 102 mg/dL — ABNORMAL HIGH (ref 70–99)
Potassium: 4.8 mmol/L (ref 3.5–5.1)
Sodium: 139 mmol/L (ref 135–145)

## 2020-08-04 LAB — CBC
HCT: 42.5 % (ref 36.0–46.0)
Hemoglobin: 13.8 g/dL (ref 12.0–15.0)
MCH: 31.2 pg (ref 26.0–34.0)
MCHC: 32.5 g/dL (ref 30.0–36.0)
MCV: 95.9 fL (ref 80.0–100.0)
Platelets: 208 10*3/uL (ref 150–400)
RBC: 4.43 MIL/uL (ref 3.87–5.11)
RDW: 12.3 % (ref 11.5–15.5)
WBC: 4.1 10*3/uL (ref 4.0–10.5)
nRBC: 0 % (ref 0.0–0.2)

## 2020-08-04 LAB — HEMOGLOBIN A1C
Hgb A1c MFr Bld: 5.8 % — ABNORMAL HIGH (ref 4.8–5.6)
Mean Plasma Glucose: 120 mg/dL

## 2020-08-04 LAB — SURGICAL PCR SCREEN
MRSA, PCR: NEGATIVE
Staphylococcus aureus: NEGATIVE

## 2020-08-04 NOTE — Progress Notes (Signed)
COVID Vaccine Completed:yes Date COVID Vaccine completed:03/30/20 COVID vaccine manufacturer: Pfizer    *[employer]   PCP - Dr. Adam Phenix. LOV: 02/17/20 Cardiologist - Dr. Skeet Latch.  Chest x-ray -  EKG -  Stress Test -  ECHO - 07/23/18 Cardiac Cath -   Sleep Study - yes CPAP - no  Fasting Blood Sugar - 70's-90's Checks Blood Sugar __4___ times a day  Blood Thinner Instructions: Aspirin Instructions: Last Dose:  Anesthesia review:   Patient denies shortness of breath, fever, cough and chest pain at PAT appointment   Patient verbalized understanding of instructions that were given to them at the PAT appointment. Patient was also instructed that they will need to review over the PAT instructions again at home before surgery.

## 2020-08-05 ENCOUNTER — Other Ambulatory Visit: Payer: Self-pay

## 2020-08-05 ENCOUNTER — Encounter (HOSPITAL_BASED_OUTPATIENT_CLINIC_OR_DEPARTMENT_OTHER): Payer: Self-pay | Admitting: Orthopedic Surgery

## 2020-08-07 ENCOUNTER — Other Ambulatory Visit (HOSPITAL_COMMUNITY)
Admission: RE | Admit: 2020-08-07 | Discharge: 2020-08-07 | Disposition: A | Discharge status: Home / Self Care | Payer: Medicare Other | Source: Ambulatory Visit | Attending: Orthopedic Surgery | Admitting: Orthopedic Surgery

## 2020-08-07 DIAGNOSIS — Z20822 Contact with and (suspected) exposure to covid-19: Secondary | ICD-10-CM | POA: Diagnosis not present

## 2020-08-07 DIAGNOSIS — Z01812 Encounter for preprocedural laboratory examination: Secondary | ICD-10-CM | POA: Insufficient documentation

## 2020-08-07 LAB — SARS CORONAVIRUS 2 (TAT 6-24 HRS): SARS Coronavirus 2: NEGATIVE

## 2020-08-10 NOTE — Anesthesia Preprocedure Evaluation (Addendum)
Anesthesia Evaluation  Patient identified by MRN, date of birth, ID band Patient awake    Reviewed: Allergy & Precautions, NPO status , Patient's Chart, lab work & pertinent test results  Airway Mallampati: III  TM Distance: >3 FB Neck ROM: Full    Dental no notable dental hx. (+) Teeth Intact, Dental Advisory Given   Pulmonary neg pulmonary ROS, sleep apnea ,  OSA (obstructive sleep apnea)  No cpap since gastric surgery    Pulmonary exam normal breath sounds clear to auscultation       Cardiovascular hypertension, negative cardio ROS Normal cardiovascular exam Rhythm:Regular Rate:Normal     Neuro/Psych  Headaches, Bipolar Disorder negative neurological ROS  negative psych ROS   GI/Hepatic negative GI ROS, Neg liver ROS, hiatal hernia, GERD  Medicated,  Endo/Other  negative endocrine ROSdiabetesHypothyroidism Morbid obesity  Renal/GU negative Renal ROS  negative genitourinary   Musculoskeletal negative musculoskeletal ROS (+) Arthritis , Osteoarthritis,    Abdominal   Peds negative pediatric ROS (+)  Hematology negative hematology ROS (+) Blood dyscrasia, anemia ,   Anesthesia Other Findings   Reproductive/Obstetrics negative OB ROS                            Anesthesia Physical  Anesthesia Plan  ASA: II  Anesthesia Plan: General   Post-op Pain Management: GA combined w/ Regional for post-op pain   Induction: Intravenous  PONV Risk Score and Plan: 3 and Ondansetron, Dexamethasone, Treatment may vary due to age or medical condition and Midazolam  Airway Management Planned: Oral ETT  Additional Equipment:   Intra-op Plan:   Post-operative Plan: Extubation in OR  Informed Consent: I have reviewed the patients History and Physical, chart, labs and discussed the procedure including the risks, benefits and alternatives for the proposed anesthesia with the patient or authorized  representative who has indicated his/her understanding and acceptance.     Dental advisory given  Plan Discussed with: CRNA, Surgeon and Anesthesiologist  Anesthesia Plan Comments:        Anesthesia Quick Evaluation

## 2020-08-11 ENCOUNTER — Ambulatory Visit (HOSPITAL_BASED_OUTPATIENT_CLINIC_OR_DEPARTMENT_OTHER): Payer: Medicare Other | Admitting: Anesthesiology

## 2020-08-11 ENCOUNTER — Encounter (HOSPITAL_BASED_OUTPATIENT_CLINIC_OR_DEPARTMENT_OTHER): Payer: Self-pay | Admitting: Orthopedic Surgery

## 2020-08-11 ENCOUNTER — Other Ambulatory Visit: Payer: Self-pay

## 2020-08-11 ENCOUNTER — Encounter (HOSPITAL_BASED_OUTPATIENT_CLINIC_OR_DEPARTMENT_OTHER)
Admission: RE | Disposition: A | Discharge status: Home / Self Care | Payer: Self-pay | Source: Ambulatory Visit | Attending: Orthopedic Surgery

## 2020-08-11 ENCOUNTER — Ambulatory Visit (HOSPITAL_COMMUNITY): Payer: Medicare Other

## 2020-08-11 ENCOUNTER — Ambulatory Visit (HOSPITAL_BASED_OUTPATIENT_CLINIC_OR_DEPARTMENT_OTHER)
Admission: RE | Admit: 2020-08-11 | Discharge: 2020-08-12 | Disposition: A | Discharge status: Home / Self Care | Payer: Medicare Other | Source: Ambulatory Visit | Attending: Orthopedic Surgery | Admitting: Orthopedic Surgery

## 2020-08-11 DIAGNOSIS — G4733 Obstructive sleep apnea (adult) (pediatric): Secondary | ICD-10-CM | POA: Insufficient documentation

## 2020-08-11 DIAGNOSIS — Z471 Aftercare following joint replacement surgery: Secondary | ICD-10-CM | POA: Diagnosis not present

## 2020-08-11 DIAGNOSIS — F419 Anxiety disorder, unspecified: Secondary | ICD-10-CM | POA: Insufficient documentation

## 2020-08-11 DIAGNOSIS — K219 Gastro-esophageal reflux disease without esophagitis: Secondary | ICD-10-CM | POA: Diagnosis not present

## 2020-08-11 DIAGNOSIS — E785 Hyperlipidemia, unspecified: Secondary | ICD-10-CM | POA: Insufficient documentation

## 2020-08-11 DIAGNOSIS — M19012 Primary osteoarthritis, left shoulder: Secondary | ICD-10-CM | POA: Diagnosis present

## 2020-08-11 DIAGNOSIS — F319 Bipolar disorder, unspecified: Secondary | ICD-10-CM | POA: Diagnosis not present

## 2020-08-11 DIAGNOSIS — Z79890 Hormone replacement therapy (postmenopausal): Secondary | ICD-10-CM | POA: Insufficient documentation

## 2020-08-11 DIAGNOSIS — E559 Vitamin D deficiency, unspecified: Secondary | ICD-10-CM | POA: Diagnosis not present

## 2020-08-11 DIAGNOSIS — Z6831 Body mass index (BMI) 31.0-31.9, adult: Secondary | ICD-10-CM | POA: Insufficient documentation

## 2020-08-11 DIAGNOSIS — Z9884 Bariatric surgery status: Secondary | ICD-10-CM | POA: Diagnosis not present

## 2020-08-11 DIAGNOSIS — I10 Essential (primary) hypertension: Secondary | ICD-10-CM | POA: Diagnosis not present

## 2020-08-11 DIAGNOSIS — Z96612 Presence of left artificial shoulder joint: Secondary | ICD-10-CM

## 2020-08-11 DIAGNOSIS — E039 Hypothyroidism, unspecified: Secondary | ICD-10-CM | POA: Insufficient documentation

## 2020-08-11 DIAGNOSIS — Z96619 Presence of unspecified artificial shoulder joint: Secondary | ICD-10-CM

## 2020-08-11 DIAGNOSIS — G8918 Other acute postprocedural pain: Secondary | ICD-10-CM | POA: Diagnosis not present

## 2020-08-11 DIAGNOSIS — Z79899 Other long term (current) drug therapy: Secondary | ICD-10-CM | POA: Insufficient documentation

## 2020-08-11 DIAGNOSIS — G43909 Migraine, unspecified, not intractable, without status migrainosus: Secondary | ICD-10-CM | POA: Diagnosis not present

## 2020-08-11 DIAGNOSIS — E119 Type 2 diabetes mellitus without complications: Secondary | ICD-10-CM | POA: Diagnosis not present

## 2020-08-11 HISTORY — PX: TOTAL SHOULDER ARTHROPLASTY: SHX126

## 2020-08-11 HISTORY — DX: Presence of unspecified artificial shoulder joint: Z96.619

## 2020-08-11 LAB — GLUCOSE, CAPILLARY
Glucose-Capillary: 119 mg/dL — ABNORMAL HIGH (ref 70–99)
Glucose-Capillary: 168 mg/dL — ABNORMAL HIGH (ref 70–99)
Glucose-Capillary: 108 mg/dL — ABNORMAL HIGH (ref 70–99)

## 2020-08-11 SURGERY — ARTHROPLASTY, SHOULDER, TOTAL
Anesthesia: General | Site: Shoulder | Laterality: Left

## 2020-08-11 MED ORDER — ACETAMINOPHEN 325 MG PO TABS: 325.0000 mg | ORAL_TABLET | Freq: Four times a day (QID) | ORAL | Status: DC | PRN

## 2020-08-11 MED ORDER — ONDANSETRON HCL 4 MG PO TABS: 4.0000 mg | ORAL_TABLET | Freq: Three times a day (TID) | ORAL | 0 refills | Status: DC | PRN

## 2020-08-11 MED ORDER — CEFAZOLIN SODIUM-DEXTROSE 2-4 GM/100ML-% IV SOLN
2.0000 g | INTRAVENOUS | Status: AC
  Administered 2020-08-11: 2 g via INTRAVENOUS

## 2020-08-11 MED ORDER — PROPOFOL 10 MG/ML IV BOLUS
INTRAVENOUS | Status: DC | PRN
  Administered 2020-08-11: 150 mg via INTRAVENOUS

## 2020-08-11 MED ORDER — ACETAMINOPHEN 325 MG PO TABS: 325.0000 mg | ORAL_TABLET | ORAL | Status: DC | PRN

## 2020-08-11 MED ORDER — CYCLOSPORINE 0.05 % OP EMUL
1.0000 [drp] | Freq: Two times a day (BID) | OPHTHALMIC | Status: DC
  Filled 2020-08-11 (×2): qty 1

## 2020-08-11 MED ORDER — BUPIVACAINE LIPOSOME 1.3 % IJ SUSP
INTRAMUSCULAR | Status: DC | PRN
  Administered 2020-08-11: 10 mL via PERINEURAL

## 2020-08-11 MED ORDER — ACETAMINOPHEN 500 MG PO TABS
500.0000 mg | ORAL_TABLET | Freq: Four times a day (QID) | ORAL | Status: DC
  Administered 2020-08-11: 500 mg via ORAL
  Filled 2020-08-11: qty 1

## 2020-08-11 MED ORDER — ONDANSETRON HCL 4 MG/2ML IJ SOLN
INTRAMUSCULAR | Status: AC
  Filled 2020-08-11: qty 2

## 2020-08-11 MED ORDER — CEFAZOLIN SODIUM-DEXTROSE 2-4 GM/100ML-% IV SOLN
2.0000 g | Freq: Four times a day (QID) | INTRAVENOUS | Status: AC
  Administered 2020-08-11 – 2020-08-12 (×3): 2 g via INTRAVENOUS
  Filled 2020-08-11 (×3): qty 100

## 2020-08-11 MED ORDER — DEXAMETHASONE SODIUM PHOSPHATE 10 MG/ML IJ SOLN
INTRAMUSCULAR | Status: AC
  Filled 2020-08-11: qty 2

## 2020-08-11 MED ORDER — METHOCARBAMOL 500 MG PO TABS
500.0000 mg | ORAL_TABLET | Freq: Four times a day (QID) | ORAL | Status: DC | PRN
  Administered 2020-08-12: 500 mg via ORAL
  Filled 2020-08-11: qty 1

## 2020-08-11 MED ORDER — CALCIUM CARBONATE-VITAMIN D 500-200 MG-UNIT PO TABS
1.0000 | ORAL_TABLET | Freq: Two times a day (BID) | ORAL | Status: DC
  Filled 2020-08-11: qty 1

## 2020-08-11 MED ORDER — SERTRALINE HCL 100 MG PO TABS
200.0000 mg | ORAL_TABLET | Freq: Every day | ORAL | Status: DC
  Filled 2020-08-11: qty 2

## 2020-08-11 MED ORDER — CHLORHEXIDINE GLUCONATE 0.12 % MT SOLN
15.0000 mL | Freq: Once | OROMUCOSAL | Status: DC
  Filled 2020-08-11: qty 15

## 2020-08-11 MED ORDER — SENNA-DOCUSATE SODIUM 8.6-50 MG PO TABS: 2.0000 | ORAL_TABLET | Freq: Every day | ORAL | 1 refills | Status: DC

## 2020-08-11 MED ORDER — INSULIN ASPART 100 UNIT/ML ~~LOC~~ SOLN
0.0000 [IU] | Freq: Three times a day (TID) | SUBCUTANEOUS | Status: DC
  Administered 2020-08-11: 3 [IU] via SUBCUTANEOUS
  Filled 2020-08-11: qty 1

## 2020-08-11 MED ORDER — LORATADINE 10 MG PO TABS
10.0000 mg | ORAL_TABLET | Freq: Every day | ORAL | Status: DC | PRN
  Filled 2020-08-11: qty 1

## 2020-08-11 MED ORDER — OLOPATADINE HCL 0.1 % OP SOLN
1.0000 [drp] | Freq: Every morning | OPHTHALMIC | Status: DC
  Filled 2020-08-11: qty 5

## 2020-08-11 MED ORDER — FENTANYL CITRATE (PF) 100 MCG/2ML IJ SOLN
INTRAMUSCULAR | Status: AC
  Filled 2020-08-11: qty 2

## 2020-08-11 MED ORDER — AZELASTINE HCL 0.1 % NA SOLN
1.0000 | Freq: Every day | NASAL | Status: DC | PRN
  Filled 2020-08-11: qty 30

## 2020-08-11 MED ORDER — MEPERIDINE HCL 25 MG/ML IJ SOLN: 6.2500 mg | INTRAMUSCULAR | Status: DC | PRN

## 2020-08-11 MED ORDER — MIDAZOLAM HCL 2 MG/2ML IJ SOLN
2.0000 mg | Freq: Once | INTRAMUSCULAR | Status: AC
  Administered 2020-08-11: 2 mg via INTRAVENOUS

## 2020-08-11 MED ORDER — LINACLOTIDE 145 MCG PO CAPS
145.0000 ug | ORAL_CAPSULE | Freq: Every day | ORAL | Status: DC
  Filled 2020-08-11 (×2): qty 1

## 2020-08-11 MED ORDER — ONDANSETRON HCL 4 MG/2ML IJ SOLN: 4.0000 mg | Freq: Once | INTRAMUSCULAR | Status: DC | PRN

## 2020-08-11 MED ORDER — ZOLPIDEM TARTRATE 5 MG PO TABS: 5.0000 mg | ORAL_TABLET | Freq: Every evening | ORAL | Status: DC | PRN

## 2020-08-11 MED ORDER — BARIATRIC MULTIVITAMINS/IRON PO CAPS: 1.0000 | ORAL_CAPSULE | Freq: Every day | ORAL | Status: DC

## 2020-08-11 MED ORDER — EPHEDRINE SULFATE 50 MG/ML IJ SOLN
INTRAMUSCULAR | Status: DC | PRN
  Administered 2020-08-11 (×2): 5 mg via INTRAVENOUS

## 2020-08-11 MED ORDER — CEFAZOLIN SODIUM-DEXTROSE 2-4 GM/100ML-% IV SOLN
INTRAVENOUS | Status: AC
  Filled 2020-08-11: qty 100

## 2020-08-11 MED ORDER — ORAL CARE MOUTH RINSE: 15.0000 mL | Freq: Once | OROMUCOSAL | Status: DC

## 2020-08-11 MED ORDER — OXYCODONE HCL 5 MG PO TABS: 5.0000 mg | ORAL_TABLET | Freq: Once | ORAL | Status: DC | PRN

## 2020-08-11 MED ORDER — BISACODYL 10 MG RE SUPP: 10.0000 mg | Freq: Every day | RECTAL | Status: DC | PRN

## 2020-08-11 MED ORDER — POLYETHYLENE GLYCOL 3350 17 G PO PACK: 17.0000 g | PACK | Freq: Every day | ORAL | Status: DC | PRN

## 2020-08-11 MED ORDER — LEVOTHYROXINE SODIUM 112 MCG PO TABS
112.0000 ug | ORAL_TABLET | Freq: Every day | ORAL | Status: AC
  Administered 2020-08-12: 112 ug via ORAL
  Filled 2020-08-11 (×2): qty 1

## 2020-08-11 MED ORDER — DOCUSATE SODIUM 100 MG PO CAPS
100.0000 mg | ORAL_CAPSULE | Freq: Two times a day (BID) | ORAL | Status: DC
  Filled 2020-08-11: qty 1

## 2020-08-11 MED ORDER — EPHEDRINE 5 MG/ML INJ
INTRAVENOUS | Status: AC
  Filled 2020-08-11: qty 10

## 2020-08-11 MED ORDER — OXYCODONE HCL 5 MG PO TABS: 5.0000 mg | ORAL_TABLET | ORAL | 0 refills | Status: DC | PRN

## 2020-08-11 MED ORDER — HYDROCODONE-ACETAMINOPHEN 7.5-325 MG PO TABS: 1.0000 | ORAL_TABLET | ORAL | Status: DC | PRN

## 2020-08-11 MED ORDER — LIDOCAINE HCL (CARDIAC) PF 100 MG/5ML IV SOSY
PREFILLED_SYRINGE | INTRAVENOUS | Status: DC | PRN
  Administered 2020-08-11: 80 mg via INTRAVENOUS

## 2020-08-11 MED ORDER — MAGNESIUM CITRATE PO SOLN
1.0000 | Freq: Once | ORAL | Status: DC | PRN
  Filled 2020-08-11: qty 296

## 2020-08-11 MED ORDER — MORPHINE SULFATE (PF) 4 MG/ML IV SOLN: 0.5000 mg | INTRAVENOUS | Status: DC | PRN

## 2020-08-11 MED ORDER — LIDOCAINE 2% (20 MG/ML) 5 ML SYRINGE
INTRAMUSCULAR | Status: AC
  Filled 2020-08-11: qty 5

## 2020-08-11 MED ORDER — RENA-VITE PO TABS: 1.0000 | ORAL_TABLET | Freq: Every day | ORAL | Status: DC

## 2020-08-11 MED ORDER — SODIUM CHLORIDE 0.9 % IV SOLN: INTRAVENOUS | Status: DC

## 2020-08-11 MED ORDER — OXYCODONE HCL 5 MG/5ML PO SOLN: 5.0000 mg | Freq: Once | ORAL | Status: DC | PRN

## 2020-08-11 MED ORDER — BUPIVACAINE-EPINEPHRINE (PF) 0.5% -1:200000 IJ SOLN
INTRAMUSCULAR | Status: DC | PRN
  Administered 2020-08-11: 15 mL via PERINEURAL

## 2020-08-11 MED ORDER — BACLOFEN 10 MG PO TABS: 10.0000 mg | ORAL_TABLET | Freq: Three times a day (TID) | ORAL | 0 refills | Status: DC

## 2020-08-11 MED ORDER — ROCURONIUM BROMIDE 100 MG/10ML IV SOLN
INTRAVENOUS | Status: DC | PRN
  Administered 2020-08-11: 60 mg via INTRAVENOUS

## 2020-08-11 MED ORDER — POVIDONE-IODINE 10 % EX SWAB: 2.0000 "application " | Freq: Once | CUTANEOUS | Status: DC

## 2020-08-11 MED ORDER — SUGAMMADEX SODIUM 200 MG/2ML IV SOLN
INTRAVENOUS | Status: DC | PRN
  Administered 2020-08-11: 200 mg via INTRAVENOUS

## 2020-08-11 MED ORDER — ZIPRASIDONE HCL 20 MG PO CAPS
60.0000 mg | ORAL_CAPSULE | Freq: Every day | ORAL | Status: AC
  Administered 2020-08-11: 60 mg via ORAL
  Filled 2020-08-11: qty 3
  Filled 2020-08-11 (×2): qty 1

## 2020-08-11 MED ORDER — METHOCARBAMOL 1000 MG/10ML IJ SOLN
INTRAMUSCULAR | Status: AC
  Filled 2020-08-11: qty 10

## 2020-08-11 MED ORDER — ONDANSETRON HCL 4 MG/2ML IJ SOLN
INTRAMUSCULAR | Status: DC | PRN
  Administered 2020-08-11: 4 mg via INTRAVENOUS

## 2020-08-11 MED ORDER — METHOCARBAMOL 1000 MG/10ML IJ SOLN
500.0000 mg | Freq: Four times a day (QID) | INTRAVENOUS | Status: DC | PRN
  Administered 2020-08-11: 500 mg via INTRAVENOUS
  Filled 2020-08-11: qty 5

## 2020-08-11 MED ORDER — GABAPENTIN 300 MG PO CAPS
300.0000 mg | ORAL_CAPSULE | ORAL | Status: DC
  Administered 2020-08-11: 600 mg via ORAL
  Filled 2020-08-11: qty 2

## 2020-08-11 MED ORDER — ONDANSETRON HCL 4 MG PO TABS: 4.0000 mg | ORAL_TABLET | Freq: Four times a day (QID) | ORAL | Status: DC | PRN

## 2020-08-11 MED ORDER — DIVALPROEX SODIUM 500 MG PO DR TAB
1000.0000 mg | DELAYED_RELEASE_TABLET | Freq: Every day | ORAL | Status: AC
  Administered 2020-08-11: 1000 mg via ORAL
  Filled 2020-08-11: qty 2

## 2020-08-11 MED ORDER — PHENYLEPHRINE 40 MCG/ML (10ML) SYRINGE FOR IV PUSH (FOR BLOOD PRESSURE SUPPORT)
PREFILLED_SYRINGE | INTRAVENOUS | Status: DC | PRN
  Administered 2020-08-11 (×4): 80 ug via INTRAVENOUS

## 2020-08-11 MED ORDER — MIDAZOLAM HCL 2 MG/2ML IJ SOLN
INTRAMUSCULAR | Status: AC
  Filled 2020-08-11: qty 2

## 2020-08-11 MED ORDER — CALCIUM-VITAMIN D-VITAMIN K 650-12.5-40 MG-MCG-MCG PO CHEW: 1.0000 | CHEWABLE_TABLET | Freq: Two times a day (BID) | ORAL | Status: DC

## 2020-08-11 MED ORDER — ADULT MULTIVITAMIN W/MINERALS CH
1.0000 | ORAL_TABLET | Freq: Every day | ORAL | Status: DC
  Filled 2020-08-11: qty 1

## 2020-08-11 MED ORDER — HYDROCODONE-ACETAMINOPHEN 5-325 MG PO TABS
1.0000 | ORAL_TABLET | ORAL | Status: DC | PRN
  Administered 2020-08-11 – 2020-08-12 (×2): 2 via ORAL
  Filled 2020-08-11 (×2): qty 2

## 2020-08-11 MED ORDER — METOCLOPRAMIDE HCL 5 MG/ML IJ SOLN: 5.0000 mg | Freq: Three times a day (TID) | INTRAMUSCULAR | Status: DC | PRN

## 2020-08-11 MED ORDER — DEXAMETHASONE SODIUM PHOSPHATE 10 MG/ML IJ SOLN
INTRAMUSCULAR | Status: DC | PRN
  Administered 2020-08-11: 10 mg via INTRAVENOUS

## 2020-08-11 MED ORDER — ACETAMINOPHEN 160 MG/5ML PO SOLN: 325.0000 mg | ORAL | Status: DC | PRN

## 2020-08-11 MED ORDER — ONDANSETRON HCL 4 MG/2ML IJ SOLN: 4.0000 mg | Freq: Four times a day (QID) | INTRAMUSCULAR | Status: DC | PRN

## 2020-08-11 MED ORDER — PANTOPRAZOLE SODIUM 40 MG PO TBEC: 40.0000 mg | DELAYED_RELEASE_TABLET | Freq: Every day | ORAL | Status: DC

## 2020-08-11 MED ORDER — FENTANYL CITRATE (PF) 100 MCG/2ML IJ SOLN
100.0000 ug | Freq: Once | INTRAMUSCULAR | Status: AC
  Administered 2020-08-11: 100 ug via INTRAVENOUS

## 2020-08-11 MED ORDER — METOCLOPRAMIDE HCL 5 MG PO TABS: 5.0000 mg | ORAL_TABLET | Freq: Three times a day (TID) | ORAL | Status: DC | PRN

## 2020-08-11 MED ORDER — FENTANYL CITRATE (PF) 100 MCG/2ML IJ SOLN: 25.0000 ug | INTRAMUSCULAR | Status: DC | PRN

## 2020-08-11 MED ORDER — LACTATED RINGERS IV SOLN: INTRAVENOUS | Status: DC

## 2020-08-11 MED ORDER — DICYCLOMINE HCL 20 MG PO TABS
20.0000 mg | ORAL_TABLET | Freq: Three times a day (TID) | ORAL | Status: DC | PRN
  Administered 2020-08-11: 20 mg via ORAL
  Filled 2020-08-11 (×3): qty 1

## 2020-08-11 MED ORDER — DIVALPROEX SODIUM 500 MG PO DR TAB
500.0000 mg | DELAYED_RELEASE_TABLET | Freq: Every day | ORAL | Status: DC
  Filled 2020-08-11: qty 1

## 2020-08-11 MED ORDER — ACETAMINOPHEN 500 MG PO TABS
1000.0000 mg | ORAL_TABLET | Freq: Once | ORAL | Status: AC
  Administered 2020-08-11: 1000 mg via ORAL

## 2020-08-11 MED ORDER — FLUTICASONE PROPIONATE 50 MCG/ACT NA SUSP
2.0000 | Freq: Every day | NASAL | Status: DC | PRN
  Filled 2020-08-11: qty 16

## 2020-08-11 MED ORDER — ACETAMINOPHEN 500 MG PO TABS
ORAL_TABLET | ORAL | Status: AC
  Filled 2020-08-11: qty 2

## 2020-08-11 MED ORDER — ROCURONIUM BROMIDE 10 MG/ML (PF) SYRINGE
PREFILLED_SYRINGE | INTRAVENOUS | Status: AC
  Filled 2020-08-11: qty 10

## 2020-08-11 MED ORDER — PROPOFOL 10 MG/ML IV BOLUS
INTRAVENOUS | Status: AC
  Filled 2020-08-11: qty 20

## 2020-08-11 SURGICAL SUPPLY — 88 items
ADH SKN CLS APL DERMABOND .7 (GAUZE/BANDAGES/DRESSINGS) ×1
ADPR HD STD TPR HUM TI RVRS (Orthopedic Implant) ×1 IMPLANT
BIT DRILL 5/64X5 DISP (BIT) IMPLANT
BIT DRILL 7/64X5 DISP (BIT) IMPLANT
BLADE HEX COATED 2.75 (ELECTRODE) ×3 IMPLANT
BLADE SAW SGTL MED 73X18.5 STR (BLADE) ×3 IMPLANT
BLADE SURG 10 STRL SS (BLADE) IMPLANT
BLADE SURG 15 STRL LF DISP TIS (BLADE) IMPLANT
BLADE SURG 15 STRL SS (BLADE) ×3
BOWL SMART MIX CTS (DISPOSABLE) IMPLANT
CEMENT BONE R 1X40 (Cement) ×3 IMPLANT
CLOSURE STERI-STRIP 1/2X4 (GAUZE/BANDAGES/DRESSINGS) ×2
CLSR STERI-STRIP ANTIMIC 1/2X4 (GAUZE/BANDAGES/DRESSINGS) ×3 IMPLANT
COOLER ICEMAN CLASSIC (MISCELLANEOUS) ×3 IMPLANT
COVER BACK TABLE 60X90IN (DRAPES) ×3 IMPLANT
COVER MAYO STAND STRL (DRAPES) ×3 IMPLANT
COVER WAND RF STERILE (DRAPES) IMPLANT
DERMABOND ADVANCED (GAUZE/BANDAGES/DRESSINGS) ×2
DERMABOND ADVANCED .7 DNX12 (GAUZE/BANDAGES/DRESSINGS) IMPLANT
DRAPE IMP U-DRAPE 54X76 (DRAPES) ×2 IMPLANT
DRAPE SURG 17X23 STRL (DRAPES) ×3 IMPLANT
DRAPE U-SHAPE 47X51 STRL (DRAPES) ×3 IMPLANT
DRAPE U-SHAPE 76X120 STRL (DRAPES) ×6 IMPLANT
DRSG AQUACEL AG ADV 3.5X 6 (GAUZE/BANDAGES/DRESSINGS) ×2 IMPLANT
DRSG MEPILEX BORDER 4X8 (GAUZE/BANDAGES/DRESSINGS) ×3 IMPLANT
DURAPREP 26ML APPLICATOR (WOUND CARE) ×3 IMPLANT
ELECT BLADE 6.5 EXT (BLADE) IMPLANT
ELECT REM PT RETURN 9FT ADLT (ELECTROSURGICAL) ×3
ELECTRODE REM PT RTRN 9FT ADLT (ELECTROSURGICAL) ×1 IMPLANT
GLENOID PEGGED HYBRID SM 4MM (Orthopedic Implant) ×2 IMPLANT
GLENOID POST REGENREX HYBRID (Orthopedic Implant) ×2 IMPLANT
GLOVE BIO SURGEON STRL SZ7 (GLOVE) ×5 IMPLANT
GLOVE BIOGEL PI IND STRL 7.0 (GLOVE) ×1 IMPLANT
GLOVE BIOGEL PI IND STRL 8 (GLOVE) ×1 IMPLANT
GLOVE BIOGEL PI INDICATOR 7.0 (GLOVE) ×8
GLOVE BIOGEL PI INDICATOR 8 (GLOVE) ×4
GLOVE ECLIPSE 6.5 STRL STRAW (GLOVE) ×2 IMPLANT
GLOVE ORTHO TXT STRL SZ7.5 (GLOVE) ×3 IMPLANT
GOWN STRL REUS W/ TWL LRG LVL3 (GOWN DISPOSABLE) ×2 IMPLANT
GOWN STRL REUS W/ TWL XL LVL3 (GOWN DISPOSABLE) ×1 IMPLANT
GOWN STRL REUS W/TWL LRG LVL3 (GOWN DISPOSABLE) ×9
GOWN STRL REUS W/TWL XL LVL3 (GOWN DISPOSABLE) ×6
HANDPIECE INTERPULSE COAX TIP (DISPOSABLE) ×3
HEAD HUMERAL BIPOLAR 42X18X46 (Orthopedic Implant) IMPLANT
HEAD HUMERAL COMP STD (Orthopedic Implant) IMPLANT
HOOD PEEL AWAY FLYTE STAYCOOL (MISCELLANEOUS) ×8 IMPLANT
HUMERAL HEAD BIPOLAR 42X18X46 (Orthopedic Implant) ×3 IMPLANT
HUMERAL HEAD COMP STD (Orthopedic Implant) ×3 IMPLANT
KIT TURNOVER KIT B (KITS) ×3 IMPLANT
MANIFOLD NEPTUNE II (INSTRUMENTS) ×3 IMPLANT
NS IRRIG 1000ML POUR BTL (IV SOLUTION) ×3 IMPLANT
PACK BASIN DAY SURGERY FS (CUSTOM PROCEDURE TRAY) ×3 IMPLANT
PACK SHOULDER (CUSTOM PROCEDURE TRAY) ×3 IMPLANT
PAD ARMBOARD 7.5X6 YLW CONV (MISCELLANEOUS) ×6 IMPLANT
PAD COLD SHLDR WRAP-ON (PAD) ×3 IMPLANT
PIN THREADED REVERSE (PIN) ×2 IMPLANT
RETRIEVER SUT HEWSON (MISCELLANEOUS) IMPLANT
SET HNDPC FAN SPRY TIP SCT (DISPOSABLE) ×1 IMPLANT
SHEET MEDIUM DRAPE 40X70 STRL (DRAPES) ×3 IMPLANT
SLEEVE SCD COMPRESS KNEE MED (MISCELLANEOUS) ×3 IMPLANT
SLING ARM FOAM STRAP LRG (SOFTGOODS) IMPLANT
SLING ARM IMMOBILIZER LRG (SOFTGOODS) IMPLANT
SLING ARM IMMOBILIZER MED (SOFTGOODS) IMPLANT
SMARTMIX MINI TOWER (MISCELLANEOUS) ×3
SPONGE LAP 18X18 RF (DISPOSABLE) IMPLANT
SPONGE LAP 4X18 RFD (DISPOSABLE) IMPLANT
STEM HUMERAL STRL 11MMX55MM (Stem) ×2 IMPLANT
SUCTION FRAZIER HANDLE 10FR (MISCELLANEOUS) ×3
SUCTION TUBE FRAZIER 10FR DISP (MISCELLANEOUS) ×1 IMPLANT
SUPPORT WRAP ARM LG (MISCELLANEOUS) ×3 IMPLANT
SUT FIBERWIRE #2 38 REV NDL BL (SUTURE) ×18
SUT FIBERWIRE #2 38 T-5 BLUE (SUTURE)
SUT MNCRL AB 4-0 PS2 18 (SUTURE) ×3 IMPLANT
SUT VIC AB 0 CT1 27 (SUTURE) ×3
SUT VIC AB 0 CT1 27XBRD ANBCTR (SUTURE) ×1 IMPLANT
SUT VIC AB 2-0 CT1 27 (SUTURE) ×3
SUT VIC AB 2-0 CT1 TAPERPNT 27 (SUTURE) ×1 IMPLANT
SUT VIC AB 2-0 SH 27 (SUTURE)
SUT VIC AB 2-0 SH 27XBRD (SUTURE) IMPLANT
SUT VIC AB 3-0 PS2 18 (SUTURE) ×2 IMPLANT
SUTURE FIBERWR #2 38 T-5 BLUE (SUTURE) IMPLANT
SUTURE FIBERWR#2 38 REV NDL BL (SUTURE) ×4 IMPLANT
TAPE STRIPS DRAPE STRL (GAUZE/BANDAGES/DRESSINGS) IMPLANT
TOWEL GREEN STERILE FF (TOWEL DISPOSABLE) ×6 IMPLANT
TOWER SMARTMIX MINI (MISCELLANEOUS) ×1 IMPLANT
TUBE CONNECTING 20'X1/4 (TUBING)
TUBE CONNECTING 20X1/4 (TUBING) IMPLANT
YANKAUER SUCT BULB TIP NO VENT (SUCTIONS) ×3 IMPLANT

## 2020-08-11 NOTE — Anesthesia Postprocedure Evaluation (Signed)
Anesthesia Post Note  Patient: Morgan [name]  Procedure(s) Performed: TOTAL SHOULDER ARTHROPLASTY (Left Shoulder)     Patient location during evaluation: PACU Anesthesia Type: General Level of consciousness: awake and alert Pain management: pain level controlled Vital Signs Assessment: post-procedure vital signs reviewed and stable Respiratory status: spontaneous breathing, nonlabored ventilation, respiratory function stable and patient connected to nasal cannula oxygen Cardiovascular status: blood pressure returned to baseline and stable Postop Assessment: no apparent nausea or vomiting Anesthetic complications: no   No complications documented.  Last Vitals:  Vitals:   08/11/20 1045 08/11/20 1100  BP: 114/66   Pulse: 77   Resp:    Temp: 36.6 C   SpO2: 98% 97%    Last Pain:  Vitals:   08/11/20 1100  TempSrc:   PainSc: 4                  Zalma Channing

## 2020-08-11 NOTE — Progress Notes (Signed)
Assisted Dr. Ambrose Pancoast with left, ultrasound guided, interscalene  block. Side rails up, monitors on throughout procedure. See vital signs in flow sheet. Tolerated Procedure well.

## 2020-08-11 NOTE — Anesthesia Procedure Notes (Signed)
Anesthesia Regional Block: Interscalene brachial plexus block   Pre-Anesthetic Checklist: ,, timeout performed, Correct Patient, Correct Site, Correct Laterality, Correct Procedure, Correct Position, site marked, Risks and benefits discussed,  Surgical consent,  Pre-op evaluation,  At surgeon's request and post-op pain management  Laterality: Left  Prep: chloraprep       Needles:  Injection technique: Single-shot  Needle Type: Echogenic Stimulator Needle     Needle Length: 5cm  Needle Gauge: 22     Additional Needles:   Procedures:, nerve stimulator,,, ultrasound used (permanent image in chart),,,,   Nerve Stimulator or Paresthesia:  Response: hand, 0.45 mA,   Additional Responses:   Narrative:  Start time: 08/11/2020 7:16 AM End time: 08/11/2020 7:20 AM Injection made incrementally with aspirations every 5 mL.  Performed by: Personally  Anesthesiologist: Janeece Riggers, MD  Additional Notes: Functioning IV was confirmed and monitors were applied.  A 68mm 22ga Arrow echogenic stimulator needle was used. Sterile prep and drape,hand hygiene and sterile gloves were used. Ultrasound guidance: relevant anatomy identified, needle position confirmed, local anesthetic spread visualized around nerve(s)., vascular puncture avoided.  Image printed for medical record. Negative aspiration and negative test dose prior to incremental administration of local anesthetic. The patient tolerated the procedure well.

## 2020-08-11 NOTE — Transfer of Care (Signed)
Immediate Anesthesia Transfer of Care Note  Patient: Morgan [name]  Procedure(s) Performed: TOTAL SHOULDER ARTHROPLASTY (Left Shoulder)  Patient Location: PACU  Anesthesia Type:GA combined with regional for post-op pain  Level of Consciousness: awake, alert  and oriented  Airway & Oxygen Therapy: Patient Spontanous Breathing and Patient connected to face mask oxygen  Post-op Assessment: Report given to RN and Post -op Vital signs reviewed and stable  Post vital signs: Reviewed and stable  Last Vitals:  Vitals Value Taken Time  BP 109/64 08/11/20 1000  Temp 36.8 C 08/11/20 1000  Pulse 78 08/11/20 1009  Resp 17 08/11/20 1009  SpO2 92 % 08/11/20 1009  Vitals shown include unvalidated device data.  Last Pain:  Vitals:   08/11/20 1000  TempSrc:   PainSc: 0-No pain      Patients Stated Pain Goal: 6 (40/76/80 8811)  Complications: No complications documented.

## 2020-08-11 NOTE — H&P (Signed)
PREOPERATIVE H&P  Chief Complaint: left shoulder pain  HPI: Morgan [name] is a 54 y.o. female who presents for preoperative history and physical with a diagnosis of left shoulder osteoarthritis. Symptoms are rated as moderate to severe, and have been worsening.  This is significantly impairing activities of daily living.  She has elected for surgical management.   She has failed injections, activity modification, anti-inflammatories, and assistive devices.  Preoperative X-rays demonstrate end stage degenerative changes with osteophyte formation, loss of joint space, subchondral sclerosis.   Past Medical History:  Diagnosis Date  . Allergic rhinitis   . Anemia     after gastric bypass in 2018  . Anxiety   . Barrett's esophagus   . Bipolar affective disorder ([place])   . CAP (community acquired pneumonia) 07/20/2018  . Chronic respiratory failure ([place])   . Constipation, chronic   . Degenerative joint disease of spine   . Depression   . Diabetes mellitus without complication ([place])    type 2   . DM (diabetes mellitus) ([place])    "hypoglycemic" per pt - no longer takes DM meds due to weight loss from gastric bypass in 2018  . Dry eye   . Family history of adverse reaction to anesthesia    nausea and vomiting  . Gastroparesis   . GERD (gastroesophageal reflux disease)   . History of bariatric surgery 03/2017  . History of hiatal hernia   . HLD (hyperlipidemia) 03/12/2013  . Hyperlipidemia   . Hypertension    No HTN meds since weight loss from Gastric Bypass in 2018  . Intractable chronic migraine without aura 06/04/2015  . Migraine headache   . Morbid obesity ([place])   . OSA (obstructive sleep apnea)    No cpap since gastric surgery  . Osteoarthritis    bilateral knee  . SBO (small bowel obstruction) ([place]) 03/19/2019  . Sleep apnea   . Unspecified hypothyroidism   . Vitamin B 12 deficiency   . Vitamin D deficiency    Past Surgical History:  Procedure Laterality Date  . ANKLE  ARTHROSCOPY WITH RECONSTRUCTION Right 09/24/2019   Procedure: ANKLE ARTHROSCOPY DEBRIDEMENT TREATMENT OF OSTEOCHONDRAL LESION TALUS. PRONEAL TENDON DEBRIDEMENT;  Surgeon: Erle Crocker, MD;  Location: [facility];  Service: Orthopedics;  Laterality: Right;  SURGERY REQUEST TIME 2 HOURS  . APPENDECTOMY  1978  . CHOLECYSTECTOMY  2005  . COLONOSCOPY    . disc repair  05/17/2019   with rods, lumbar spine  . GASTRIC ROUX-EN-Y N/A 03/21/2017   Procedure: LAPAROSCOPIC ROUX-EN-Y GASTRIC, UPPER ENDO;  Surgeon: Greer Pickerel, MD;  Location: [facility];  Service: General;  Laterality: N/A;  . GASTROSTOMY N/A 06/19/2018   Procedure: LAPRASCOPIC INSERTION OF GASTROSTOMY TUBE;  Surgeon: Greer Pickerel, MD;  Location: [facility];  Service: General;  Laterality: N/A;  . GASTROSTOMY TUBE PLACEMENT Left    06/2018  . HERNIA REPAIR    . HIATAL HERNIA REPAIR N/A 06/19/2018   Procedure: LAPAROSCOPIC REPAIR OF HIATAL HERNIA;  Surgeon: Greer Pickerel, MD;  Location: [facility];  Service: General;  Laterality: N/A;  . LAPAROSCOPIC LYSIS OF ADHESIONS  03/19/2019   Dr. Romana Juniper  . LAPAROSCOPY N/A 06/19/2018   Procedure: LAPAROSCOPY DIAGNOSTIC;  Surgeon: Greer Pickerel, MD;  Location: [facility];  Service: General;  Laterality: N/A;  . LAPAROSCOPY N/A 03/19/2019   Procedure: LAPAROSCOPY DIAGNOSTIC lysis of adhesions;  Surgeon: Clovis Riley, MD;  Location: [facility];  Service: General;  Laterality: N/A;  . LUMBAR LAMINECTOMY/DECOMPRESSION  MICRODISCECTOMY Right 10/11/2018   Procedure: LAMINECTOMY AND FORAMINOTOMY RIGHT LUMBAR FOUR- LUMBAR FIVE;  Surgeon: Consuella Lose, MD;  Location: [facility];  Service: Neurosurgery;  Laterality: Right;  . SHOULDER ARTHROSCOPY  7/12   left-dsc  . TONSILLECTOMY  at age 65  . TRIGGER FINGER RELEASE  12/20/2012   Procedure: RELEASE TRIGGER FINGER/A-1 PULLEY;  Surgeon: Tennis Must, MD;  Location: [facility];  Service: Orthopedics;  Laterality: Left;  LEFT TRIGGER THUMB  RELEASE   Social History   Socioeconomic History  . Marital status: Single    Spouse name: Not on file  . Number of children: 0  . Years of education: HS  . Highest education level: 12th grade  Occupational History  . Occupation: disabled    Fish farm manager: UNEMPLOYED  Tobacco Use  . Smoking status: Never Smoker  . Smokeless tobacco: Never Used  Vaping Use  . Vaping Use: Never used  Substance and Sexual Activity  . Alcohol use: No  . Drug use: No  . Sexual activity: Never    Birth control/protection: Post-menopausal    Comment: LMP 10/2017  Other Topics Concern  . Not on file  Social History Narrative   Patient is right handed.   Patient drinks 2 glasses of caffeine daily.   Lives at home. Her niece is living with her right now.   Social Determinants of Health   Financial Resource Strain: Low Risk   . Difficulty of Paying Living Expenses: Not very hard  Food Insecurity:   . Worried About Charity fundraiser in the Last Year: Not on file  . Ran Out of Food in the Last Year: Not on file  Transportation Needs: No Transportation Needs  . Lack of Transportation (Medical): No  . Lack of Transportation (Non-Medical): No  Physical Activity: Inactive  . Days of Exercise per Week: 0 days  . Minutes of Exercise per Session: 0 min  Stress:   . Feeling of Stress : Not on file  Social Connections: Moderately Integrated  . Frequency of Communication with Friends and Family: More than three times a week  . Frequency of Social Gatherings with Friends and Family: More than three times a week  . Attends Religious Services: More than 4 times per year  . Active Member of Clubs or Organizations: Yes  . Attends Archivist Meetings: More than 4 times per year  . Marital Status: Never married   Family History  Problem Relation Age of Onset  . Asthma Father   . Allergies Father   . Heart disease Father        enlarged heart  . Peripheral vascular disease Father   . Diabetes  Father   . Hyperlipidemia Father   . Arthritis Father   . Asthma Sister   . Cancer Sister        colon at 4 yr old.  . Colon cancer Sister   . Allergies Mother   . Stroke Mother 38       with hemi paralysis  . Diabetes Mother   . Hyperlipidemia Mother   . Hypertension Mother   . GI problems Mother   . Arthritis Mother   . Allergies Brother   . Early death Brother 9       congenital abormality  . Allergies Sister   . Diabetes Sister   . Asthma Sister   . Colon polyps Sister   . Hyperlipidemia Sister   . GI problems Sister  gastroporesis   . Liver disease Sister        fatty liver  . Stroke Sister        intercrandial bleed  . Diabetes Brother   . Hypertension Brother   . Hyperlipidemia Brother   . Heart disease Paternal Aunt   . Migraines Neg Hx    Allergies  Allergen Reactions  . Ketoconazole Hives and Swelling    SWELLING REACTION UNSPECIFIED   . Pravachol [Pravastatin Sodium] Shortness Of Breath, Swelling and Anaphylaxis    Throat swelling  . Statins Other (See Comments)    Leg cramps  . Tape Dermatitis and Other (See Comments)    Steri-Strips  . Codeine Hypertension    increased BP   Prior to Admission medications   Medication Sig Start Date End Date Taking? Authorizing Provider  acetaminophen (TYLENOL) 500 MG tablet Take 1,000 mg by mouth every 4 (four) hours as needed for moderate pain.    Yes [provider]  azelastine (ASTELIN) 0.1 % nasal spray Place 1 spray into both nostrils 2 (two) times daily. Patient taking differently: Place 1 spray into both nostrils daily as needed for rhinitis.  08/16/19  Yes Janora Norlander, DO  Calcium-Vitamin D-Vitamin K 650-12.5-40 MG-MCG-MCG CHEW Chew 1 tablet by mouth 2 (two) times daily.   Yes [provider]  celecoxib (CELEBREX) 100 MG capsule Take 1 capsule (100 mg total) by mouth daily. Patient taking differently: Take 100 mg by mouth daily as needed for moderate pain.  06/12/20  Yes  Gottschalk, Leatrice Jewels M, DO  cyclobenzaprine (FLEXERIL) 10 MG tablet Take 5-10 mg by mouth at bedtime as needed for muscle spasms.  05/21/20  Yes [provider]  dicyclomine (BENTYL) 20 MG tablet TAKE 1 TABLET EVERY 8 HOURS AS NEEDED FOR ABDOMINAL CRAMPING Patient taking differently: Take 20 mg by mouth every 8 (eight) hours as needed for spasms.  03/09/20  Yes Dettinger, Fransisca Kaufmann, MD  divalproex (DEPAKOTE) 500 MG DR tablet TAKE 1 TABLET EVERY MORNING AND 2 TABLETS AT BEDTIME Patient taking differently: Take 500-1,000 mg by mouth See admin instructions. Take 500 mg by mouth in the morning and 1000 mg at bedtime 03/09/20  Yes Lomax, Amy, NP  fluticasone (FLONASE) 50 MCG/ACT nasal spray USE 2 SPRAYS IN EACH NOSTRIL TWICE DAILY AS NEEDED FOR ALLERGY Patient taking differently: Place 2 sprays into both nostrils daily as needed for allergies.  04/13/20  Yes Gottschalk, Leatrice Jewels M, DO  gabapentin (NEURONTIN) 300 MG capsule Takes 2 capsules in morning, one cap at lunch, 2 caps at bedtime Patient taking differently: Take 300-600 mg by mouth See admin instructions. Take 600 mg by mouth in morning, 300 mg at lunch,and 600 mg at bedtime 07/13/20  Yes Lomax, Amy, NP  Galcanezumab-gnlm (EMGALITY) 120 MG/ML SOAJ Inject 120 mg into the skin every 30 (thirty) days. 01/14/20  Yes Lomax, Amy, NP  levothyroxine (SYNTHROID) 112 MCG tablet TAKE 1 TABLET BEFORE BREAKFAST Patient taking differently: Take 112 mcg by mouth daily before breakfast.  05/06/20  Yes Janora Norlander, DO  LINZESS 145 MCG CAPS capsule Take 145 mcg by mouth daily before breakfast.  07/26/19  Yes [provider]  loratadine (CLARITIN) 10 MG tablet Take 10 mg by mouth daily as needed for allergies.    Yes [provider]  Multiple Vitamins-Minerals (BARIATRIC MULTIVITAMINS/IRON PO) Take 1 tablet by mouth daily.   Yes [provider]  nystatin (NYSTATIN) powder APPLY TO AFFECTED AREA OF GROIN RASH TWICE A DAY  FOR 7 TO 10 DAYS PER  FLARE Patient taking differently: Apply 1 application topically daily as needed (irritation).  05/29/20  Yes Gottschalk, Ashly M, DO  pantoprazole (PROTONIX) 40 MG tablet Take 1 tablet (40 mg total) by mouth daily. 05/06/20  Yes Gottschalk, Ashly M, DO  PAZEO 0.7 % SOLN Place 1 drop into both eyes every morning. 06/10/16  Yes [provider]  polyethylene glycol (MIRALAX / GLYCOLAX) packet Take 17 g by mouth at bedtime.    Yes [provider]  Probiotic Product (PROBIOTIC DAILY PO) Take 1 capsule by mouth daily.   Yes [provider]  RESTASIS MULTIDOSE 0.05 % ophthalmic emulsion Place 1 drop into both eyes 2 (two) times daily. 06/17/16  Yes [provider]  sertraline (ZOLOFT) 100 MG tablet TAKE 2 TABLETS DAILY AS DIRECTED Patient taking differently: Take 200 mg by mouth daily.  07/07/20  Yes Gottschalk, Leatrice Jewels M, DO  simethicone (MYLICON) 791 MG chewable tablet Chew 125 mg by mouth in the morning, at noon, and at bedtime.   Yes [provider]  sucralfate (CARAFATE) 1 GM/10ML suspension Take 1 g by mouth daily as needed (when taking celebrex).  02/29/20  Yes [provider]  ziprasidone (GEODON) 60 MG capsule TAKE 1 CAPSULE AT BEDTIME Patient taking differently: Take 60 mg by mouth at bedtime.  07/07/20  Yes Gottschalk, Leatrice Jewels M, DO  Glucagon, rDNA, (GLUCAGON EMERGENCY IJ) Inject 1 Syringe as directed daily as needed (emergency low blood sugar).     [provider]  ondansetron (ZOFRAN) 4 MG tablet TAKE  (1)  TABLET  EVERY EIGHT HOURS AS NEEDED. Patient taking differently: Take 4 mg by mouth every 8 (eight) hours as needed for nausea or vomiting.  12/16/19   Melvenia Beam, MD  ONETOUCH ULTRA test strip CHECK BLOOD SUGAR FOUR TIMES A DAY. 06/08/20   Ronnie Doss M, DO  nystatin [facility]) powder APPLY TOPICALLY TWICE A DAY AS NEEDED Patient taking differently: Apply 1 g topically 2 (two) times daily as needed (yeast).  07/06/17   Timmothy Euler, MD     Positive ROS: All other systems have been reviewed and were otherwise negative with the exception of those mentioned in the HPI and as above.  Physical Exam: General: Alert, no acute distress Cardiovascular: No pedal edema Respiratory: No cyanosis, no use of accessory musculature GI: No organomegaly, abdomen is soft and non-tender Skin: No lesions in the area of chief complaint Neurologic: Sensation intact distally Psychiatric: Patient is competent for consent with normal mood and affect Lymphatic: No axillary or cervical lymphadenopathy  MUSCULOSKELETAL: left shoulder has a painful arc of motion, fair strength with cuff testing, limited secondary to pain.  Assessment: Left shoulder osteoarthritis   Plan: Plan for Procedure(s): TOTAL SHOULDER ARTHROPLASTY  The risks benefits and alternatives were discussed with the patient including but not limited to the risks of nonoperative treatment, versus surgical intervention including infection, bleeding, nerve injury,  blood clots, cardiopulmonary complications, morbidity, mortality, among others, and they were willing to proceed.    Patient's anticipated LOS is less than 2 midnights, meeting these requirements: - Younger than 57 - Lives within 1 hour of care - Has a competent adult at home to recover with post-op recover - NO history of  - Chronic pain requiring opiods  - Diabetes  - Coronary Artery Disease  - Heart failure  - Heart attack  - Stroke  - DVT/VTE  - Cardiac arrhythmia  - Respiratory Failure/COPD  -  Renal failure  - Anemia  - Advanced Liver disease        Johnny Bridge, MD Cell [phone]   08/11/2020 7:22 AM

## 2020-08-11 NOTE — Anesthesia Procedure Notes (Signed)
Procedure Name: Intubation Date/Time: 08/11/2020 7:37 AM Performed by: [place] Marrie Chandra C, CRNA Pre-anesthesia Checklist: Patient identified, Emergency Drugs available, Suction available and Patient being monitored Patient Re-evaluated:Patient Re-evaluated prior to induction Oxygen Delivery Method: Circle system utilized Preoxygenation: Pre-oxygenation with 100% oxygen Induction Type: IV induction Ventilation: Mask ventilation without difficulty Laryngoscope Size: Mac and 3 Tube type: Oral Tube size: 7.0 mm Number of attempts: 1 Airway Equipment and Method: Stylet and Oral airway Placement Confirmation: ETT inserted through vocal cords under direct vision,  positive ETCO2 and breath sounds checked- equal and bilateral Secured at: 20 cm Tube secured with: Tape Dental Injury: Teeth and Oropharynx as per pre-operative assessment

## 2020-08-11 NOTE — Op Note (Signed)
08/11/2020  9:43 AM  PATIENT:  Morgan [name]    PRE-OPERATIVE DIAGNOSIS: Left shoulder primary localized osteoarthritis  POST-OPERATIVE DIAGNOSIS:  Same  PROCEDURE: LEFT total Shoulder Arthroplasty  SURGEON:  Johnny Bridge, MD  PHYSICIAN ASSISTANT: Merlene Pulling, PA-C, present and scrubbed throughout the case, critical for completion in a timely fashion, and for retraction, instrumentation, and closure.  ANESTHESIA:   General with an interscalene block  ESTIMATED BLOOD LOSS: 150 mL  UNIQUE ASPECTS OF THE CASE: She had fairly good motion, with some degree of laxity within the shoulder, at the end of the procedure she translated just to the posterior rim, and then self reduced.  The articular cartilage of the humeral head had moderate chondral loss, with osteophyte formation diffusely.  The glenoid also had some moderate changes.  The glenoid implant I felt was centered, and had complete containment centrally, superiorly, and posteriorly, although was bicortical anteriorly.  In retrospect I may have been just very slightly anterior with the start point of the guidepin.  PREOPERATIVE INDICATIONS:  Morgan [name] is a  54 y.o. female who failed conservative measures and elected for surgical management.  She had previous shoulder arthroscopy that demonstrated chondral loss and then had progressive osteophyte formation and arthritic changes and elected for total shoulder replacement.  The risks benefits and alternatives were discussed with the patient preoperatively including but not limited to the risks of infection, bleeding, nerve injury, cardiopulmonary complications, the need for revision surgery, dislocation, loosening, incomplete relief of pain, among others, and the patient was willing to proceed.   OPERATIVE IMPLANTS: Biomet size 11 micro press-fit humeral stem, size 42+18 versa-dial humeral head, set in the D position with increased coverage posteriorly, with a small cemented  glenoid polyethylene 3 peg implant with a central regenerex noncemented post.   OPERATIVE FINDINGS: Advanced glenohumeral osteoarthritis involving the glenoid and the humeral head with substantial osteophyte formation inferiorly.   OPERATIVE PROCEDURE: The patient was brought to the operating room and placed in the supine position. General anesthesia was administered. IV antibiotics were given.  The upper extremity was prepped and draped in usual sterile fashion. The patient was in a beachchair position with all bony prominences padded.   Time out was performed and a deltopectoral approach was carried out. The biceps tendon was tenodesed to the pectoralis tendon. The subscapularis was released, tagging it with a #2 FiberWire, leaving a cuff of tendon for repair.   The inferior osteophyte was removed, and release of the capsule off of the humeral side was completed. The head was dislocated, and I reamed sequentially. I placed the humeral cutting guide at 30 of retroversion, and then pinned this into place, and made my humeral neck cut. This was at the appropriate level.   I then placed deep retractors and exposed the glenoid. I excised the labrum circumferentially, taking care to protect the axillary nerve inferiorly.   I then placed a guidewire into the center position, controlling appropriate version and inclination. I then reamed over the guidewire with the small reamer, and was satisfied with the preparation. I preserved the subchondral bone in order to maximize the strength and minimize the risk for subsequent subsidence.   I then drilled the central hole for the regenerex peg, and then placed the guide, and then drilled the 3 peripheral peg holes. I had excellent bony circumferential contact. The anterior hole was bicortical.  The others were contained.    I then cleaned the glenoid, irrigated it copiously, and  then dried it and cemented the prosthesis into place. Excellent seating was  achieved. I had full exposure. The cement cured while I turned my attention to the humeral side.   I sequentially broached, up to the selected size, with the broach set at 30 of retroversion. I placed 3 #2 Fiberwire through the bone for subsequent repair.  I then placed the real stem. I trialed with multiple heads, and the above-named component was selected. Increased posterior coverage improved the coverage. The soft tissue tension was appropriate.   I then impacted the real humeral head into place, reduced the head, and irrigated copiously. Excellent stability and range of motion was achieved. I repaired the subscapularis with a total of 5 #2 FiberWire; one for the interval, one for the corner, and then the remaining three from the lesser tuberosity which had already been passed.  Excellent repair achieved and I irrigated copiously once more. The subcutaneous tissue was closed with Vicryl including the deltopectoral fascia.   The skin was closed with Steri-Strips and sterile gauze was applied. She had a preoperative nerve block. She tolerated the procedure well and there were no complications.

## 2020-08-12 ENCOUNTER — Encounter (HOSPITAL_BASED_OUTPATIENT_CLINIC_OR_DEPARTMENT_OTHER): Payer: Self-pay | Admitting: Orthopedic Surgery

## 2020-08-12 DIAGNOSIS — G43909 Migraine, unspecified, not intractable, without status migrainosus: Secondary | ICD-10-CM | POA: Diagnosis not present

## 2020-08-12 DIAGNOSIS — E119 Type 2 diabetes mellitus without complications: Secondary | ICD-10-CM | POA: Diagnosis not present

## 2020-08-12 DIAGNOSIS — Z79899 Other long term (current) drug therapy: Secondary | ICD-10-CM | POA: Diagnosis not present

## 2020-08-12 DIAGNOSIS — E039 Hypothyroidism, unspecified: Secondary | ICD-10-CM | POA: Diagnosis not present

## 2020-08-12 DIAGNOSIS — E785 Hyperlipidemia, unspecified: Secondary | ICD-10-CM | POA: Diagnosis not present

## 2020-08-12 DIAGNOSIS — I10 Essential (primary) hypertension: Secondary | ICD-10-CM | POA: Diagnosis not present

## 2020-08-12 DIAGNOSIS — E559 Vitamin D deficiency, unspecified: Secondary | ICD-10-CM | POA: Diagnosis not present

## 2020-08-12 DIAGNOSIS — Z9884 Bariatric surgery status: Secondary | ICD-10-CM | POA: Diagnosis not present

## 2020-08-12 DIAGNOSIS — G4733 Obstructive sleep apnea (adult) (pediatric): Secondary | ICD-10-CM | POA: Diagnosis not present

## 2020-08-12 DIAGNOSIS — K219 Gastro-esophageal reflux disease without esophagitis: Secondary | ICD-10-CM | POA: Diagnosis not present

## 2020-08-12 DIAGNOSIS — M19012 Primary osteoarthritis, left shoulder: Secondary | ICD-10-CM | POA: Diagnosis not present

## 2020-08-12 LAB — GLUCOSE, CAPILLARY: Glucose-Capillary: 112 mg/dL — ABNORMAL HIGH (ref 70–99)

## 2020-08-12 NOTE — Discharge Summary (Signed)
Discharge Summary  Patient ID: Morgan [name] MRN: [id number] DOB/AGE: [birth year] 53 y.o.  Admit date: 08/11/2020 Discharge date: 08/12/2020  Admission Diagnoses:  Osteoarthritis of left shoulder  Discharge Diagnoses:  Principal Problem:   Osteoarthritis of left shoulder Active Problems:   S/P shoulder replacement, left   Past Medical History:  Diagnosis Date  . Allergic rhinitis   . Anemia     after gastric bypass in 2018  . Anxiety   . Barrett's esophagus   . Bipolar affective disorder ([place])   . CAP (community acquired pneumonia) 07/20/2018  . Chronic respiratory failure ([place])   . Constipation, chronic   . Degenerative joint disease of spine   . Depression   . Diabetes mellitus without complication ([place])    type 2   . DM (diabetes mellitus) ([place])    "hypoglycemic" per pt - no longer takes DM meds due to weight loss from gastric bypass in 2018  . Dry eye   . Family history of adverse reaction to anesthesia    nausea and vomiting  . Gastroparesis   . GERD (gastroesophageal reflux disease)   . History of bariatric surgery 03/2017  . History of hiatal hernia   . HLD (hyperlipidemia) 03/12/2013  . Hyperlipidemia   . Hypertension    No HTN meds since weight loss from Gastric Bypass in 2018  . Intractable chronic migraine without aura 06/04/2015  . Migraine headache   . Morbid obesity ([place])   . OSA (obstructive sleep apnea)    No cpap since gastric surgery  . Osteoarthritis    bilateral knee  . SBO (small bowel obstruction) ([place]) 03/19/2019  . Sleep apnea   . Unspecified hypothyroidism   . Vitamin B 12 deficiency   . Vitamin D deficiency     Surgeries: Procedure(s): TOTAL SHOULDER ARTHROPLASTY on 08/11/2020   Consultants (if any):   Discharged Condition: Improved  Hospital Course: Morgan [name] is an 54 y.o. female who was admitted 08/11/2020 with a diagnosis of Osteoarthritis of left shoulder and went to the operating room on 08/11/2020 and underwent the above  named procedures.    She was given perioperative antibiotics:  Anti-infectives (From admission, onward)   Start     Dose/Rate Route Frequency Ordered Stop   08/11/20 1100  ceFAZolin (ANCEF) IVPB 2g/100 mL premix        2 g 200 mL/hr over 30 Minutes Intravenous Every 6 hours 08/11/20 1046 08/12/20 0204   08/11/20 0630  ceFAZolin (ANCEF) IVPB 2g/100 mL premix        2 g 200 mL/hr over 30 Minutes Intravenous On call to O.R. 08/11/20 4709 08/11/20 6283    .  She was given sequential compression devices and early ambulation for DVT prophylaxis.  She benefited maximally from the hospital stay and there were no complications.    Recent vital signs:  Vitals:   08/12/20 0615 08/12/20 0715  BP:    Pulse: 78 89  Resp:    Temp:    SpO2: 98% 100%    Recent laboratory studies:  Lab Results  Component Value Date   HGB 13.8 08/04/2020   HGB 13.4 08/16/2019   HGB 13.4 08/16/2019   Lab Results  Component Value Date   WBC 4.1 08/04/2020   PLT 208 08/04/2020   Lab Results  Component Value Date   INR 1.0 05/18/2019   Lab Results  Component Value Date   NA 139 08/04/2020   K 4.8 08/04/2020   CL 99 08/04/2020  CO2 32 08/04/2020   BUN 20 08/04/2020   CREATININE 0.61 08/04/2020   GLUCOSE 102 (H) 08/04/2020    Discharge Medications:   Allergies as of 08/12/2020      Reactions   Ketoconazole Hives, Swelling   SWELLING REACTION UNSPECIFIED    Pravachol [pravastatin Sodium] Shortness Of Breath, Swelling, Anaphylaxis   Throat swelling   Statins Other (See Comments)   Leg cramps   Tape Dermatitis, Other (See Comments)   Steri-Strips   Codeine Hypertension   increased BP      Medication List    STOP taking these medications   acetaminophen 500 MG tablet Commonly known as: TYLENOL   celecoxib 100 MG capsule Commonly known as: CELEBREX   cyclobenzaprine 10 MG tablet Commonly known as: FLEXERIL     TAKE these medications   azelastine 0.1 % nasal spray Commonly known  as: ASTELIN Place 1 spray into both nostrils 2 (two) times daily. What changed:   when to take this  reasons to take this   baclofen 10 MG tablet Commonly known as: LIORESAL Take 1 tablet (10 mg total) by mouth 3 (three) times daily. As needed for muscle spasm   BARIATRIC MULTIVITAMINS/IRON PO Take 1 tablet by mouth daily.   Calcium-Vitamin D-Vitamin K 650-12.5-40 MG-MCG-MCG Chew Chew 1 tablet by mouth 2 (two) times daily.   dicyclomine 20 MG tablet Commonly known as: BENTYL TAKE 1 TABLET EVERY 8 HOURS AS NEEDED FOR ABDOMINAL CRAMPING What changed: See the new instructions.   divalproex 500 MG DR tablet Commonly known as: DEPAKOTE TAKE 1 TABLET EVERY MORNING AND 2 TABLETS AT BEDTIME What changed: See the new instructions.   Emgality 120 MG/ML [place] Generic drug: Galcanezumab-gnlm Inject 120 mg into the skin every 30 (thirty) days.   fluticasone 50 MCG/ACT nasal spray Commonly known as: FLONASE USE 2 SPRAYS IN EACH NOSTRIL TWICE DAILY AS NEEDED FOR ALLERGY What changed: See the new instructions.   gabapentin 300 MG capsule Commonly known as: NEURONTIN Takes 2 capsules in morning, one cap at lunch, 2 caps at bedtime What changed:   how much to take  how to take this  when to take this  additional instructions   GLUCAGON EMERGENCY IJ Inject 1 Syringe as directed daily as needed (emergency low blood sugar).   levothyroxine 112 MCG tablet Commonly known as: SYNTHROID TAKE 1 TABLET BEFORE BREAKFAST What changed: See the new instructions.   Linzess 145 MCG Caps capsule Generic drug: linaclotide Take 145 mcg by mouth daily before breakfast.   loratadine 10 MG tablet Commonly known as: CLARITIN Take 10 mg by mouth daily as needed for allergies.   nystatin powder Commonly known as: nystatin APPLY TO AFFECTED AREA OF GROIN RASH TWICE A DAY FOR 7 TO 10 DAYS PER FLARE What changed:   how much to take  how to take this  when to take this  reasons to take  this  additional instructions   ondansetron 4 MG tablet Commonly known as: Zofran Take 1 tablet (4 mg total) by mouth every 8 (eight) hours as needed for nausea or vomiting. What changed: See the new instructions.   OneTouch Ultra test strip Generic drug: glucose blood CHECK BLOOD SUGAR FOUR TIMES A DAY.   oxyCODONE 5 MG immediate release tablet Commonly known as: Roxicodone Take 1 tablet (5 mg total) by mouth every 4 (four) hours as needed for severe pain.   pantoprazole 40 MG tablet Commonly known as: PROTONIX Take 1 tablet (40 mg total)  by mouth daily.   Pazeo 0.7 % Soln Generic drug: Olopatadine HCl Place 1 drop into both eyes every morning.   polyethylene glycol 17 g packet Commonly known as: MIRALAX / GLYCOLAX Take 17 g by mouth at bedtime.   PROBIOTIC DAILY PO Take 1 capsule by mouth daily.   Restasis Multidose 0.05 % ophthalmic emulsion Generic drug: cycloSPORINE Place 1 drop into both eyes 2 (two) times daily.   sennosides-docusate sodium 8.6-50 MG tablet Commonly known as: SENOKOT-S Take 2 tablets by mouth daily. For constipation when taking pain medication   sertraline 100 MG tablet Commonly known as: ZOLOFT TAKE 2 TABLETS DAILY AS DIRECTED What changed: See the new instructions.   simethicone 125 MG chewable tablet Commonly known as: MYLICON Chew 579 mg by mouth in the morning, at noon, and at bedtime.   sucralfate 1 GM/10ML suspension Commonly known as: CARAFATE Take 1 g by mouth daily as needed (when taking celebrex).   ziprasidone 60 MG capsule Commonly known as: GEODON TAKE 1 CAPSULE AT BEDTIME       Diagnostic Studies: DG Shoulder Left Port  Result Date: 08/11/2020 CLINICAL DATA:  Left shoulder arthroplasty. Postoperative examination. EXAM: LEFT SHOULDER COMPARISON:  None. FINDINGS: Two view radiograph of the left shoulder demonstrate surgical changes of left total shoulder arthroplasty. Arthroplasty components are in anatomic alignment.  Subcutaneous gas within the subacromial space is likely postsurgical in nature. There is mild resorption of the distal clavicle with resultant widening of the acromioclavicular joint space. No unexpected fracture or dislocation. Limited evaluation of the left apex is unremarkable. IMPRESSION: Status post left total shoulder arthroplasty without evidence of immediate postoperative complication. Electronically Signed   By: Fidela Salisbury MD   On: 08/11/2020 23:36    Disposition: Discharge disposition: 01-Home or Self Care          Follow-up Information    Marchia Bond, MD. Schedule an appointment as soon as possible for a visit in 2 weeks.   Specialty: Orthopedic Surgery Contact information: [address] [place] [address] [phone]                Signed: Ventura Bruns PA-C 08/12/2020, 8:04 AM

## 2020-08-12 NOTE — Discharge Instructions (Signed)
Diet: As you were doing prior to hospitalization   Shower:  May shower but keep the wounds dry, use an occlusive plastic wrap, NO SOAKING IN TUB.  If the bandage gets wet, change with a clean dry gauze.  If you have a splint on, leave the splint in place and keep the splint dry with a plastic bag.  Dressing:  You may change your dressing 3-5 days after surgery, unless you have a splint.  If you have a splint, then just leave the splint in place and we will change your bandages during your first follow-up appointment.    If you had hand or foot surgery, we will plan to remove your stitches in about 2 weeks in the office.  For all other surgeries, there are sticky tapes (steri-strips) on your wounds and all the stitches are absorbable.  Leave the steri-strips in place when changing your dressings, they will peel off with time, usually 2-3 weeks.  Activity:  Increase activity slowly as tolerated, but follow the weight bearing instructions below.  The rules on driving is that you can not be taking narcotics while you drive, and you must feel in control of the vehicle.    Weight Bearing:  No bearing weight with left arm, keep in sling until follow-up  To prevent constipation: you may use a stool softener such as -  Colace (over the counter) 100 mg by mouth twice a day  Drink plenty of fluids (prune juice may be helpful) and high fiber foods Miralax (over the counter) for constipation as needed.    Itching:  If you experience itching with your medications, try taking only a single pain pill, or even half a pain pill at a time.  You may take up to 10 pain pills per day, and you can also use benadryl over the counter for itching or also to help with sleep.   Precautions:  If you experience chest pain or shortness of breath - call 911 immediately for transfer to the hospital emergency department!!  If you develop a fever greater that 101 F, purulent drainage from wound, increased redness or drainage  from wound, or calf pain -- Call the office at [phone]                                                Follow- Up Appointment:  Please call for an appointment to be seen in 2 weeks [place] - [phone]   Post Anesthesia Home Care Instructions  Activity: Get plenty of rest for the remainder of the day. A responsible individual must stay with you for 24 hours following the procedure.  For the next 24 hours, DO NOT: -Drive a car -Paediatric nurse -Drink alcoholic beverages -Take any medication unless instructed by your physician -Make any legal decisions or sign important papers.  Meals: Start with liquid foods such as gelatin or soup. Progress to regular foods as tolerated. Avoid greasy, spicy, heavy foods. If nausea and/or vomiting occur, drink only clear liquids until the nausea and/or vomiting subsides. Call your physician if vomiting continues.  Special Instructions/Symptoms: Your throat may feel dry or sore from the anesthesia or the breathing tube placed in your throat during surgery. If this causes discomfort, gargle with warm salt water. The discomfort should disappear within 24 hours.  If you had a scopolamine patch placed behind your ear  for the management of post- operative nausea and/or vomiting:  1. The medication in the patch is effective for 72 hours, after which it should be removed.  Wrap patch in a tissue and discard in the trash. Wash hands thoroughly with soap and water. 2. You may remove the patch earlier than 72 hours if you experience unpleasant side effects which may include dry mouth, dizziness or visual disturbances. 3. Avoid touching the patch. Wash your hands with soap and water after contact with the patch.    Regional Anesthesia Blocks  1. Numbness or the inability to move the "blocked" extremity may last from 3-48 hours after placement. The length of time depends on the medication injected and your individual response to the medication. If the  numbness is not going away after 48 hours, call your surgeon.  2. The extremity that is blocked will need to be protected until the numbness is gone and the  Strength has returned. Because you cannot feel it, you will need to take extra care to avoid injury. Because it may be weak, you may have difficulty moving it or using it. You may not know what position it is in without looking at it while the block is in effect.  3. For blocks in the legs and feet, returning to weight bearing and walking needs to be done carefully. You will need to wait until the numbness is entirely gone and the strength has returned. You should be able to move your leg and foot normally before you try and bear weight or walk. You will need someone to be with you when you first try to ensure you do not fall and possibly risk injury.  4. Bruising and tenderness at the needle site are common side effects and will resolve in a few days.  5. Persistent numbness or new problems with movement should be communicated to the surgeon or the [facility] [phone] [place] [phone]).   Information for Discharge Teaching: EXPAREL (bupivacaine liposome injectable suspension)   Your surgeon or anesthesiologist gave you EXPAREL(bupivacaine) to help control your pain after surgery.   EXPAREL is a local anesthetic that provides pain relief by numbing the tissue around the surgical site.  EXPAREL is designed to release pain medication over time and can control pain for up to 72 hours.  Depending on how you respond to EXPAREL, you may require less pain medication during your recovery.  Possible side effects:  Temporary loss of sensation or ability to move in the area where bupivacaine was injected.  Nausea, vomiting, constipation  Rarely, numbness and tingling in your mouth or lips, lightheadedness, or anxiety may occur.  Call your doctor right away if you think you may be experiencing any of  these sensations, or if you have other questions regarding possible side effects.  Follow all other discharge instructions given to you by your surgeon or nurse. Eat a healthy diet and drink plenty of water or other fluids.  If you return to the hospital for any reason within 96 hours following the administration of EXPAREL, it is important for health care providers to know that you have received this anesthetic. A teal colored band has been placed on your arm with the date, time and amount of EXPAREL you have received in order to alert and inform your health care providers. Please leave this armband in place for the full 96 hours following administration, and then you may remove the band.

## 2020-08-24 DIAGNOSIS — M19012 Primary osteoarthritis, left shoulder: Secondary | ICD-10-CM | POA: Diagnosis not present

## 2020-09-07 NOTE — Patient Instructions (Signed)
Garima Chronis, BSN, RN-BC Embedded Chronic Care Manager [facility] Care Management Direct Dial: [phone]    

## 2020-09-07 NOTE — Chronic Care Management (AMB) (Signed)
  Chronic Care Management   Initial Visit Note  10/23/2019 Name: Morgan [name] MRN: [id number] DOB: 12-15-1965  Referred by: Janora Norlander, DO Reason for referral : Chronic Care Management (RN Initial Visit)   Morgan [name] is a 54 y.o. year old female who is a primary care patient of Janora Norlander, DO. The CCM team was consulted for assistance with chronic disease management and care coordination needs related to HTN, DMII and hypothyroidism  Review of patient status, including review of consultants reports, relevant laboratory and other test results, and collaboration with appropriate care team members and the patient's provider was performed as part of comprehensive patient evaluation and provision of chronic care management services.    Patient is not interested in CCM services at this time.   SDOH (Social Determinants of Health) assessments performed: Yes See Care Plan activities for detailed interventions related to SDOH    Plan:  CCM enrollment status changed to "previously enrolled" as per patient request on 10/23/2019 to discontinue enrollment. Case closed to case management services in primary care home.   Chong Sicilian, BSN, RN-BC Embedded Chronic Care Manager [facility] / [facility] Management Direct Dial: [phone]

## 2020-09-23 DIAGNOSIS — M19012 Primary osteoarthritis, left shoulder: Secondary | ICD-10-CM | POA: Diagnosis not present

## 2020-09-29 ENCOUNTER — Ambulatory Visit: Payer: Medicare Other | Attending: Orthopedic Surgery | Admitting: Physical Therapy

## 2020-09-29 ENCOUNTER — Other Ambulatory Visit: Payer: Self-pay

## 2020-09-29 DIAGNOSIS — M25512 Pain in left shoulder: Secondary | ICD-10-CM | POA: Insufficient documentation

## 2020-09-29 DIAGNOSIS — R600 Localized edema: Secondary | ICD-10-CM | POA: Insufficient documentation

## 2020-09-29 DIAGNOSIS — M25612 Stiffness of left shoulder, not elsewhere classified: Secondary | ICD-10-CM | POA: Diagnosis not present

## 2020-09-29 DIAGNOSIS — G8929 Other chronic pain: Secondary | ICD-10-CM | POA: Insufficient documentation

## 2020-09-29 NOTE — Therapy (Signed)
[facility] [place], [place], [address] Phone: [phone]   Fax:  [phone]  Physical Therapy Evaluation  Patient Details  Name: Morgan [name] MRN: [id number] Date of Birth: [birth year] Referring Provider (PT): Marchia Bond MD   Encounter Date: 09/29/2020   PT End of Session - 09/29/20 0902    Visit Number 1    Number of Visits 16    Date for PT Re-Evaluation 11/24/20    Authorization Type FOTO.    PT Start Time 0815    PT Stop Time 0857    PT Time Calculation (min) 42 min    Activity Tolerance Patient tolerated treatment well    Behavior During Therapy WFL for tasks assessed/performed           Past Medical History:  Diagnosis Date  . Allergic rhinitis   . Anemia     after gastric bypass in 2018  . Anxiety   . Barrett's esophagus   . Bipolar affective disorder ([place])   . CAP (community acquired pneumonia) 07/20/2018  . Chronic respiratory failure ([place])   . Constipation, chronic   . Degenerative joint disease of spine   . Depression   . Diabetes mellitus without complication ([place])    type 2   . DM (diabetes mellitus) ([place])    "hypoglycemic" per pt - no longer takes DM meds due to weight loss from gastric bypass in 2018  . Dry eye   . Family history of adverse reaction to anesthesia    nausea and vomiting  . Gastroparesis   . GERD (gastroesophageal reflux disease)   . History of bariatric surgery 03/2017  . History of hiatal hernia   . HLD (hyperlipidemia) 03/12/2013  . Hyperlipidemia   . Hypertension    No HTN meds since weight loss from Gastric Bypass in 2018  . Intractable chronic migraine without aura 06/04/2015  . Migraine headache   . Morbid obesity ([place])   . OSA (obstructive sleep apnea)    No cpap since gastric surgery  . Osteoarthritis    bilateral knee  . SBO (small bowel obstruction) ([place]) 03/19/2019  . Sleep apnea   . Unspecified hypothyroidism   . Vitamin B 12 deficiency   . Vitamin  D deficiency     Past Surgical History:  Procedure Laterality Date  . ANKLE ARTHROSCOPY WITH RECONSTRUCTION Right 09/24/2019   Procedure: ANKLE ARTHROSCOPY DEBRIDEMENT TREATMENT OF OSTEOCHONDRAL LESION TALUS. PRONEAL TENDON DEBRIDEMENT;  Surgeon: Erle Crocker, MD;  Location: [facility];  Service: Orthopedics;  Laterality: Right;  SURGERY REQUEST TIME 2 HOURS  . APPENDECTOMY  1978  . CHOLECYSTECTOMY  2005  . COLONOSCOPY    . disc repair  05/17/2019   with rods, lumbar spine  . GASTRIC ROUX-EN-Y N/A 03/21/2017   Procedure: LAPAROSCOPIC ROUX-EN-Y GASTRIC, UPPER ENDO;  Surgeon: Greer Pickerel, MD;  [facility];  Service: General;  Laterality: N/A;  . GASTROSTOMY N/A 06/19/2018   Procedure: LAPRASCOPIC INSERTION OF GASTROSTOMY TUBE;  Surgeon: Greer Pickerel, MD;  [facility];  Service: General;  Laterality: N/A;  . GASTROSTOMY TUBE PLACEMENT Left    06/2018  . HERNIA REPAIR    . HIATAL HERNIA REPAIR N/A 06/19/2018   Procedure: LAPAROSCOPIC REPAIR OF HIATAL HERNIA;  Surgeon: Greer Pickerel, MD;  [facility];  Service: General;  Laterality: N/A;  . LAPAROSCOPIC LYSIS OF ADHESIONS  03/19/2019   Dr. Romana Juniper  . LAPAROSCOPY N/A 06/19/2018   Procedure: LAPAROSCOPY DIAGNOSTIC;  Surgeon: Greer Pickerel, MD;  [facility];  Service: General;  Laterality: N/A;  . LAPAROSCOPY N/A 03/19/2019   Procedure: LAPAROSCOPY DIAGNOSTIC lysis of adhesions;  Surgeon: Clovis Riley, MD;  [facility];  Service: General;  Laterality: N/A;  . LUMBAR LAMINECTOMY/DECOMPRESSION MICRODISCECTOMY Right 10/11/2018   Procedure: LAMINECTOMY AND FORAMINOTOMY RIGHT LUMBAR FOUR- LUMBAR FIVE;  Surgeon: Consuella Lose, MD;  Location: [facility];  Service: Neurosurgery;  Laterality: Right;  . SHOULDER ARTHROSCOPY  7/12   left-dsc  . TONSILLECTOMY  at age 10  . TOTAL SHOULDER ARTHROPLASTY Left 08/11/2020   Procedure: TOTAL SHOULDER ARTHROPLASTY;  Surgeon: Marchia Bond, MD;  Location: [facility];  Service: Orthopedics;  Laterality: Left;  . TRIGGER FINGER RELEASE  12/20/2012   Procedure: RELEASE TRIGGER FINGER/A-1 PULLEY;  Surgeon: Tennis Must, MD;  Location: [facility];  Service: Orthopedics;  Laterality: Left;  LEFT TRIGGER THUMB RELEASE    There were no vitals filed for this visit.    Subjective Assessment - 09/29/20 0853    Subjective COVID-19 screen performed prior to patient entering clinic.  The patient underwent a left TSA on 08/11/20.  She is pleased with her progress thus far.  She has a sling donned that she no longer needs to wear around her home.  Her pain at rest today is rated at a 3/10 and higher with movement.    Pertinent History DM, Bi-polar, DJD, DM, ankle surgery.    Patient Stated Goals Use left arm without pain.    Currently in Pain? Yes    Pain Score 3     Pain Location Shoulder    Pain Orientation Left    Pain Descriptors / Indicators Throbbing    Pain Type Surgical pain    Pain Onset More than a month ago    Pain Frequency Constant    Aggravating Factors  See above.    Pain Relieving Factors See above.              [facility] PT Assessment - 09/29/20 0001      Assessment   Medical Diagnosis S/p left TSA.    Referring Provider (PT) Marchia Bond MD    Onset Date/Surgical Date --   08/11/20 (surgical date).     Precautions   Precaution Comments Follow TSA protocol.  No ultrasound.      Restrictions   Weight Bearing Restrictions --   NO LT UE weight bearing.     Balance Screen   Has the patient fallen in the past 6 months No    Has the patient had a decrease in activity level because of a fear of falling?  No    Is the patient reluctant to leave their home because of a fear of falling?  No      Home Environment   Living Environment Private residence      Prior Function   Level of Independence Independent      Observation/Other Assessments   Observations Left shoulder incision looks to be healing very  well.    Focus on Therapeutic Outcomes (FOTO)  68% limitation.      ROM / Strength   AROM / PROM / Strength PROM      PROM   Overall PROM Comments In supine: Left shoulder passive flexion to 92 degrees and ER to 7 degrees.      Palpation   Palpation comment Mild anterior left shoulder tenderness.      Special Tests  Other special tests Minimal left shoulder edema.                      Objective measurements completed on examination: See above findings.       [facility] Adult PT Treatment/Exercise - 09/29/20 0001      Modalities   Modalities Vasopneumatic      Vasopneumatic   Number Minutes Vasopneumatic  15 minutes    Vasopnuematic Location  --   LT shoulder with pillow btw elbow and thorax.                      PT Long Term Goals - 09/29/20 0913      PT LONG TERM GOAL #1   Title Independent with a HEP and progression.    Time 8    Period Weeks    Status New      PT LONG TERM GOAL #2   Title left shoulder flexion to 145 degrees so the patient can easily reach overhead.    Time 8    Period Weeks    Status New      PT LONG TERM GOAL #3   Title Active ER to 70 degrees+ to allow for easily donning/doffing of apparel.    Time 8    Period Weeks    Status New      PT LONG TERM GOAL #4   Title Increase ROM so patient is able to reach behind back to L3.    Time 8    Period Weeks    Status New      PT LONG TERM GOAL #5   Title Perform ADL's with pain not > 3/10.    Time 8    Period Weeks    Status New      PT LONG TERM GOAL #6   Title Increase shoulder strength to a solid 4+/5 to increase stability for performance of functional activities.    Time 8    Period Weeks                  Plan - 09/29/20 1478    Clinical Impression Statement The patient presents to OPPT s/p left shoulder TSA performed on 08/11/20.  She is pleased with her progress thus far.  She has a loss of range of motion as expected.  She is compliant to her  sling usage when out in community.  Her FOTO limitation score is 68%.  Patient will benefit from skilled physical therapy intervention to address deficits and pain.    Personal Factors and Comorbidities Comorbidity 1;Comorbidity 3+;Comorbidity 2    Comorbidities DM, Bi-polar, DJD, DM, ankle surgery.    Examination-Activity Limitations Other    Examination-Participation Restrictions Other    Stability/Clinical Decision Making Stable/Uncomplicated    Clinical Decision Making Low    Rehab Potential Excellent    PT Frequency 2x / week    PT Duration 8 weeks    PT Treatment/Interventions ADLs/Self Care Home Management;Cryotherapy;Electrical Stimulation;Moist Heat;Therapeutic activities;Therapeutic exercise;Manual techniques;Patient/family education;Passive range of motion;Vasopneumatic Device    PT Next Visit Plan Follow TSA protocol.  vasopneumatic on low with pillow between left elbow and thorax.           Patient will benefit from skilled therapeutic intervention in order to improve the following deficits and impairments:  Pain, Decreased activity tolerance, Decreased range of motion, Increased edema  Visit Diagnosis: Chronic left shoulder pain - Plan: PT plan of care cert/re-cert  Localized  edema - Plan: PT plan of care cert/re-cert  Stiffness of left shoulder, not elsewhere classified - Plan: PT plan of care cert/re-cert     Problem List Patient Active Problem List   Diagnosis Date Noted  . Osteoarthritis of left shoulder 08/11/2020  . S/P shoulder replacement, left 08/11/2020  . AMS (altered mental status) 05/19/2019  . Thrombocytopenia ([place]) 05/19/2019  . SBO (small bowel obstruction) s/p lap LOA 03/19/2019 03/19/2019  . Chronic migraine without aura, with intractable migraine, so stated, with status migrainosus 11/20/2018  . Lumbar radiculopathy 10/11/2018  . Atelectasis   . Hypoglycemia   . Hypotension   . Bradycardia   . Epigastric pain 06/19/2018  . History of  adenomatous polyp of colon 10/23/2017  . Hyperlipidemia associated with type 2 diabetes mellitus ([place]) 07/06/2017  . GERD (gastroesophageal reflux disease) 03/21/2017  . History of Roux-en-Y gastric bypass 2018 03/21/2017  . Morbid obesity with BMI of 40.0-44.9, adult ([place]) 06/06/2016  . Intractable chronic migraine without aura 06/04/2015  . Abnormal uterine bleeding 05/13/2015  . Pancreatitis 02/27/2014  . Fatty liver disease, nonalcoholic 56/81/2751  . Bipolar disorder ([place]) 02/20/2014  . Metabolic syndrome 70/12/7492  . Vitamin D deficiency   . DM (diabetes mellitus) ([place]) 03/12/2013  . Surgery, elective 06/12/2011  . Hypothyroidism   . Constipation, chronic   . Vitamin B 12 deficiency   . ALLERGIC RHINITIS 12/31/2010  . BARRETTS ESOPHAGUS 12/31/2010  . Gastroparesis 12/31/2010  . Bilateral chronic knee pain 12/31/2010  . Obstructive sleep apnea 09/27/2010  . Migraine 09/27/2010  . RESPIRATORY FAILURE, CHRONIC 09/27/2010  . Cyst of ovary 10/19/2009    Jorene Kaylor, [place] MPT 09/29/2020, 9:17 AM  [facility] [address] [place], [place], [address] Phone: [phone]   Fax:  [phone]  Name: CHENILLE TOOR MRN: [id number] Date of Birth: [birth year]

## 2020-10-02 ENCOUNTER — Ambulatory Visit: Payer: Medicare Other | Admitting: *Deleted

## 2020-10-02 ENCOUNTER — Other Ambulatory Visit: Payer: Self-pay

## 2020-10-02 DIAGNOSIS — G8929 Other chronic pain: Secondary | ICD-10-CM

## 2020-10-02 DIAGNOSIS — R600 Localized edema: Secondary | ICD-10-CM | POA: Diagnosis not present

## 2020-10-02 DIAGNOSIS — M25512 Pain in left shoulder: Secondary | ICD-10-CM

## 2020-10-02 DIAGNOSIS — M25612 Stiffness of left shoulder, not elsewhere classified: Secondary | ICD-10-CM | POA: Diagnosis not present

## 2020-10-02 NOTE — Therapy (Signed)
[facility] [place], [place], [address] Phone: [phone]   Fax:  [phone]  Physical Therapy Treatment  Patient Details  Name: Morgan [name] MRN: [id number] Date of Birth: October 19, 1966 Referring Provider (PT): Marchia Bond MD   Encounter Date: 10/02/2020   PT End of Session - 10/02/20 0805    Visit Number 2    Number of Visits 16    Date for PT Re-Evaluation 11/24/20    Authorization Type FOTO.    PT Start Time 0815    PT Stop Time 0905    PT Time Calculation (min) 50 min           Past Medical History:  Diagnosis Date  . Allergic rhinitis   . Anemia     after gastric bypass in 2018  . Anxiety   . Barrett's esophagus   . Bipolar affective disorder ([place])   . CAP (community acquired pneumonia) 07/20/2018  . Chronic respiratory failure ([place])   . Constipation, chronic   . Degenerative joint disease of spine   . Depression   . Diabetes mellitus without complication ([place])    type 2   . DM (diabetes mellitus) ([place])    "hypoglycemic" per pt - no longer takes DM meds due to weight loss from gastric bypass in 2018  . Dry eye   . Family history of adverse reaction to anesthesia    nausea and vomiting  . Gastroparesis   . GERD (gastroesophageal reflux disease)   . History of bariatric surgery 03/2017  . History of hiatal hernia   . HLD (hyperlipidemia) 03/12/2013  . Hyperlipidemia   . Hypertension    No HTN meds since weight loss from Gastric Bypass in 2018  . Intractable chronic migraine without aura 06/04/2015  . Migraine headache   . Morbid obesity ([place])   . OSA (obstructive sleep apnea)    No cpap since gastric surgery  . Osteoarthritis    bilateral knee  . SBO (small bowel obstruction) ([place]) 03/19/2019  . Sleep apnea   . Unspecified hypothyroidism   . Vitamin B 12 deficiency   . Vitamin D deficiency     Past Surgical History:  Procedure Laterality Date  . ANKLE ARTHROSCOPY WITH RECONSTRUCTION  Right 09/24/2019   Procedure: ANKLE ARTHROSCOPY DEBRIDEMENT TREATMENT OF OSTEOCHONDRAL LESION TALUS. PRONEAL TENDON DEBRIDEMENT;  Surgeon: Erle Crocker, MD;  Location: [facility];  Service: Orthopedics;  Laterality: Right;  SURGERY REQUEST TIME 2 HOURS  . APPENDECTOMY  1978  . CHOLECYSTECTOMY  2005  . COLONOSCOPY    . disc repair  05/17/2019   with rods, lumbar spine  . GASTRIC ROUX-EN-Y N/A 03/21/2017   Procedure: LAPAROSCOPIC ROUX-EN-Y GASTRIC, UPPER ENDO;  Surgeon: Greer Pickerel, MD;  [facility];  Service: General;  Laterality: N/A;  . GASTROSTOMY N/A 06/19/2018   Procedure: LAPRASCOPIC INSERTION OF GASTROSTOMY TUBE;  Surgeon: Greer Pickerel, MD;  [facility];  Service: General;  Laterality: N/A;  . GASTROSTOMY TUBE PLACEMENT Left    06/2018  . HERNIA REPAIR    . HIATAL HERNIA REPAIR N/A 06/19/2018   Procedure: LAPAROSCOPIC REPAIR OF HIATAL HERNIA;  Surgeon: Greer Pickerel, MD;  [facility];  Service: General;  Laterality: N/A;  . LAPAROSCOPIC LYSIS OF ADHESIONS  03/19/2019   Dr. Romana Juniper  . LAPAROSCOPY N/A 06/19/2018   Procedure: LAPAROSCOPY DIAGNOSTIC;  Surgeon: Greer Pickerel, MD;  [facility];  Service: General;  Laterality: N/A;  . LAPAROSCOPY N/A 03/19/2019  Procedure: LAPAROSCOPY DIAGNOSTIC lysis of adhesions;  Surgeon: Clovis Riley, MD;  [facility];  Service: General;  Laterality: N/A;  . LUMBAR LAMINECTOMY/DECOMPRESSION MICRODISCECTOMY Right 10/11/2018   Procedure: LAMINECTOMY AND FORAMINOTOMY RIGHT LUMBAR FOUR- LUMBAR FIVE;  Surgeon: Consuella Lose, MD;  Location: [facility];  Service: Neurosurgery;  Laterality: Right;  . SHOULDER ARTHROSCOPY  7/12   left-dsc  . TONSILLECTOMY  at age 33  . TOTAL SHOULDER ARTHROPLASTY Left 08/11/2020   Procedure: TOTAL SHOULDER ARTHROPLASTY;  Surgeon: Marchia Bond, MD;  Location: [facility];  Service: Orthopedics;  Laterality: Left;  . TRIGGER FINGER RELEASE  12/20/2012   Procedure:  RELEASE TRIGGER FINGER/A-1 PULLEY;  Surgeon: Tennis Must, MD;  Location: [facility];  Service: Orthopedics;  Laterality: Left;  LEFT TRIGGER THUMB RELEASE    There were no vitals filed for this visit.   Subjective Assessment - 10/02/20 0803    Subjective COVID-19 screen performed prior to patient entering clinic.  The patient underwent a left TSA on 08/11/20.    Pertinent History DM, Bi-polar, DJD, DM, ankle surgery.    Patient Stated Goals Use left arm without pain.    Currently in Pain? Yes    Pain Score 3     Pain Location Shoulder    Pain Orientation Left    Pain Descriptors / Indicators Throbbing    Pain Type Surgical pain    Pain Onset More than a month ago                             [facility] Adult PT Treatment/Exercise - 10/02/20 0001      Exercises   Exercises Shoulder      Shoulder Exercises: Supine   Other Supine Exercises Supine cane exs: press x 20, flexion , ER in scaption x 20       Shoulder Exercises: Pulleys   Flexion 5 minutes    Other Pulley Exercises Seated UE ranger  x 5 mins  flexion/ extension , CW/CCW      Modalities   Modalities Vasopneumatic      Vasopneumatic   Number Minutes Vasopneumatic  15 minutes    Vasopnuematic Location  --   LT shoulder with pillow btw elbow and thorax.   Vasopneumatic Pressure Low    Vasopneumatic Temperature  34 for edema      Manual Therapy   Manual Therapy Passive ROM    Passive ROM PROM all motions to LT shldr f/b Rhythmic stab for all motions                       PT Long Term Goals - 09/29/20 0913      PT LONG TERM GOAL #1   Title Independent with a HEP and progression.    Time 8    Period Weeks    Status New      PT LONG TERM GOAL #2   Title left shoulder flexion to 145 degrees so the patient can easily reach overhead.    Time 8    Period Weeks    Status New      PT LONG TERM GOAL #3   Title Active ER to 70 degrees+ to allow for easily donning/doffing of  apparel.    Time 8    Period Weeks    Status New      PT LONG TERM GOAL #4   Title Increase ROM so  patient is able to reach behind back to L3.    Time 8    Period Weeks    Status New      PT LONG TERM GOAL #5   Title Perform ADL's with pain not > 3/10.    Time 8    Period Weeks    Status New      PT LONG TERM GOAL #6   Title Increase shoulder strength to a solid 4+/5 to increase stability for performance of functional activities.    Time 8    Period Weeks                 Plan - 10/02/20 7824    Clinical Impression Statement Pt arrived doing fairly well and was able to perform AAROM and PROM to LT shldr all motions and did great. Rhythmic stab tolerated very well with good activation.    Personal Factors and Comorbidities Comorbidity 1;Comorbidity 3+;Comorbidity 2    Comorbidities DM, Bi-polar, DJD, DM, ankle surgery.    Examination-Participation Restrictions Other    Rehab Potential Excellent    PT Frequency 2x / week    PT Duration 8 weeks    PT Treatment/Interventions ADLs/Self Care Home Management;Cryotherapy;Electrical Stimulation;Moist Heat;Therapeutic activities;Therapeutic exercise;Manual techniques;Patient/family education;Passive range of motion;Vasopneumatic Device    PT Next Visit Plan Follow TSA protocol.  vasopneumatic on low with pillow between left elbow and thorax.    Consulted and Agree with Plan of Care Patient           Patient will benefit from skilled therapeutic intervention in order to improve the following deficits and impairments:  Pain, Decreased activity tolerance, Decreased range of motion, Increased edema  Visit Diagnosis: Chronic left shoulder pain  Localized edema  Stiffness of left shoulder, not elsewhere classified     Problem List Patient Active Problem List   Diagnosis Date Noted  . Osteoarthritis of left shoulder 08/11/2020  . S/P shoulder replacement, left 08/11/2020  . AMS (altered mental status) 05/19/2019  .  Thrombocytopenia ([place]) 05/19/2019  . SBO (small bowel obstruction) s/p lap LOA 03/19/2019 03/19/2019  . Chronic migraine without aura, with intractable migraine, so stated, with status migrainosus 11/20/2018  . Lumbar radiculopathy 10/11/2018  . Atelectasis   . Hypoglycemia   . Hypotension   . Bradycardia   . Epigastric pain 06/19/2018  . History of adenomatous polyp of colon 10/23/2017  . Hyperlipidemia associated with type 2 diabetes mellitus ([place]) 07/06/2017  . GERD (gastroesophageal reflux disease) 03/21/2017  . History of Roux-en-Y gastric bypass 2018 03/21/2017  . Morbid obesity with BMI of 40.0-44.9, adult ([place]) 06/06/2016  . Intractable chronic migraine without aura 06/04/2015  . Abnormal uterine bleeding 05/13/2015  . Pancreatitis 02/27/2014  . Fatty liver disease, nonalcoholic 23/53/6144  . Bipolar disorder ([place]) 02/20/2014  . Metabolic syndrome 31/54/0086  . Vitamin D deficiency   . DM (diabetes mellitus) ([place]) 03/12/2013  . Surgery, elective 06/12/2011  . Hypothyroidism   . Constipation, chronic   . Vitamin B 12 deficiency   . ALLERGIC RHINITIS 12/31/2010  . BARRETTS ESOPHAGUS 12/31/2010  . Gastroparesis 12/31/2010  . Bilateral chronic knee pain 12/31/2010  . Obstructive sleep apnea 09/27/2010  . Migraine 09/27/2010  . RESPIRATORY FAILURE, CHRONIC 09/27/2010  . Cyst of ovary 10/19/2009    Kennidi Yoshida,CHRIS, PTA 10/02/2020, 9:39 AM  [facility] [place], [place], [address] Phone: [phone]   Fax:  [phone]  Name: Morgan [name] MRN: [id number] Date of Birth: [birth year]

## 2020-10-03 ENCOUNTER — Other Ambulatory Visit: Payer: Self-pay | Admitting: Family Medicine

## 2020-10-03 DIAGNOSIS — F3170 Bipolar disorder, currently in remission, most recent episode unspecified: Secondary | ICD-10-CM

## 2020-10-05 ENCOUNTER — Telehealth: Payer: Self-pay | Admitting: Family Medicine

## 2020-10-05 MED ORDER — DIVALPROEX SODIUM 500 MG PO DR TAB: DELAYED_RELEASE_TABLET | ORAL | 5 refills | Status: DC

## 2020-10-05 NOTE — Telephone Encounter (Signed)
I called and spoke to pharmcy.  We have prescribed in past.  Placed order for pt. Divalproex 500gm po am and 1000mg  po pm She has appt 10-20-20.

## 2020-10-05 NOTE — Telephone Encounter (Signed)
[facility] Roselyn Reef) called, prescription for divalproex (DEPAKOTE) 500 MG DR tablet was denied due to prescriber not with location. Please verify prescriber is at this location. Would like a call from the nurse.

## 2020-10-06 ENCOUNTER — Other Ambulatory Visit: Payer: Self-pay

## 2020-10-06 ENCOUNTER — Ambulatory Visit: Payer: Medicare Other | Admitting: Physical Therapy

## 2020-10-06 DIAGNOSIS — M25512 Pain in left shoulder: Secondary | ICD-10-CM | POA: Diagnosis not present

## 2020-10-06 DIAGNOSIS — R600 Localized edema: Secondary | ICD-10-CM | POA: Diagnosis not present

## 2020-10-06 DIAGNOSIS — M25612 Stiffness of left shoulder, not elsewhere classified: Secondary | ICD-10-CM | POA: Diagnosis not present

## 2020-10-06 DIAGNOSIS — G8929 Other chronic pain: Secondary | ICD-10-CM

## 2020-10-06 NOTE — Therapy (Signed)
[facility] [place], [place], [address] Phone: [phone]   Fax:  [phone]  Physical Therapy Treatment  Patient Details  Name: Morgan [name] MRN: [id number] Date of Birth: Jan 29, 1966 Referring Provider (PT): Marchia Bond MD   Encounter Date: 10/06/2020   PT End of Session - 10/06/20 0926    Visit Number 3    Number of Visits 16    Date for PT Re-Evaluation 11/24/20    Authorization Type FOTO.    PT Start Time [phone]    PT Stop Time 0903    PT Time Calculation (min) 47 min    Activity Tolerance Patient tolerated treatment well    Behavior During Therapy [facility] for tasks assessed/performed           Past Medical History:  Diagnosis Date  . Allergic rhinitis   . Anemia     after gastric bypass in 2018  . Anxiety   . Barrett's esophagus   . Bipolar affective disorder ([place])   . CAP (community acquired pneumonia) 07/20/2018  . Chronic respiratory failure ([place])   . Constipation, chronic   . Degenerative joint disease of spine   . Depression   . Diabetes mellitus without complication ([place])    type 2   . DM (diabetes mellitus) ([place])    "hypoglycemic" per pt - no longer takes DM meds due to weight loss from gastric bypass in 2018  . Dry eye   . Family history of adverse reaction to anesthesia    nausea and vomiting  . Gastroparesis   . GERD (gastroesophageal reflux disease)   . History of bariatric surgery 03/2017  . History of hiatal hernia   . HLD (hyperlipidemia) 03/12/2013  . Hyperlipidemia   . Hypertension    No HTN meds since weight loss from Gastric Bypass in 2018  . Intractable chronic migraine without aura 06/04/2015  . Migraine headache   . Morbid obesity ([place])   . OSA (obstructive sleep apnea)    No cpap since gastric surgery  . Osteoarthritis    bilateral knee  . SBO (small bowel obstruction) ([place]) 03/19/2019  . Sleep apnea   . Unspecified hypothyroidism   . Vitamin B 12 deficiency   . Vitamin  D deficiency     Past Surgical History:  Procedure Laterality Date  . ANKLE ARTHROSCOPY WITH RECONSTRUCTION Right 09/24/2019   Procedure: ANKLE ARTHROSCOPY DEBRIDEMENT TREATMENT OF OSTEOCHONDRAL LESION TALUS. PRONEAL TENDON DEBRIDEMENT;  Surgeon: Erle Crocker, MD;  Location: [place];  Service: Orthopedics;  Laterality: Right;  SURGERY REQUEST TIME 2 HOURS  . APPENDECTOMY  1978  . CHOLECYSTECTOMY  2005  . COLONOSCOPY    . disc repair  05/17/2019   with rods, lumbar spine  . GASTRIC ROUX-EN-Y N/A 03/21/2017   Procedure: LAPAROSCOPIC ROUX-EN-Y GASTRIC, UPPER ENDO;  Surgeon: Greer Pickerel, MD;  [facility];  Service: General;  Laterality: N/A;  . GASTROSTOMY N/A 06/19/2018   Procedure: LAPRASCOPIC INSERTION OF GASTROSTOMY TUBE;  Surgeon: Greer Pickerel, MD;  [facility];  Service: General;  Laterality: N/A;  . GASTROSTOMY TUBE PLACEMENT Left    06/2018  . HERNIA REPAIR    . HIATAL HERNIA REPAIR N/A 06/19/2018   Procedure: LAPAROSCOPIC REPAIR OF HIATAL HERNIA;  Surgeon: Greer Pickerel, MD;  [facility];  Service: General;  Laterality: N/A;  . LAPAROSCOPIC LYSIS OF ADHESIONS  03/19/2019   Dr. Romana Juniper  . LAPAROSCOPY N/A 06/19/2018   Procedure: LAPAROSCOPY DIAGNOSTIC;  Surgeon: Greer Pickerel, MD;  [facility];  Service: General;  Laterality: N/A;  . LAPAROSCOPY N/A 03/19/2019   Procedure: LAPAROSCOPY DIAGNOSTIC lysis of adhesions;  Surgeon: Clovis Riley, MD;  [facility];  Service: General;  Laterality: N/A;  . LUMBAR LAMINECTOMY/DECOMPRESSION MICRODISCECTOMY Right 10/11/2018   Procedure: LAMINECTOMY AND FORAMINOTOMY RIGHT LUMBAR FOUR- LUMBAR FIVE;  Surgeon: Consuella Lose, MD;  Location: [facility];  Service: Neurosurgery;  Laterality: Right;  . SHOULDER ARTHROSCOPY  7/12   left-dsc  . TONSILLECTOMY  at age 75  . TOTAL SHOULDER ARTHROPLASTY Left 08/11/2020   Procedure: TOTAL SHOULDER ARTHROPLASTY;  Surgeon: Marchia Bond, MD;  Location: [facility];  Service: Orthopedics;  Laterality: Left;  . TRIGGER FINGER RELEASE  12/20/2012   Procedure: RELEASE TRIGGER FINGER/A-1 PULLEY;  Surgeon: Tennis Must, MD;  Location: [facility];  Service: Orthopedics;  Laterality: Left;  LEFT TRIGGER THUMB RELEASE    There were no vitals filed for this visit.   Subjective Assessment - 10/06/20 0923    Subjective COVID-19 screen performed prior to patient entering clinic.  No new complaints.    Pertinent History DM, Bi-polar, DJD, DM, ankle surgery.    Patient Stated Goals Use left arm without pain.                             [facility] Adult PT Treatment/Exercise - 10/06/20 0001      Shoulder Exercises: Pulleys   Flexion 5 minutes      Vasopneumatic   Number Minutes Vasopneumatic  15 minutes    Vasopnuematic Location  --   Pillow btw left elbow and thorax.     Manual Therapy   Manual Therapy Passive ROM    Passive ROM PROM x 18 minutes to patient's left shoulder and instruct in supine passive left shoulder flexion with cane.                       PT Long Term Goals - 09/29/20 0913      PT LONG TERM GOAL #1   Title Independent with a HEP and progression.    Time 8    Period Weeks    Status New      PT LONG TERM GOAL #2   Title left shoulder flexion to 145 degrees so the patient can easily reach overhead.    Time 8    Period Weeks    Status New      PT LONG TERM GOAL #3   Title Active ER to 70 degrees+ to allow for easily donning/doffing of apparel.    Time 8    Period Weeks    Status New      PT LONG TERM GOAL #4   Title Increase ROM so patient is able to reach behind back to L3.    Time 8    Period Weeks    Status New      PT LONG TERM GOAL #5   Title Perform ADL's with pain not > 3/10.    Time 8    Period Weeks    Status New      PT LONG TERM GOAL #6   Title Increase shoulder strength to a solid 4+/5 to increase stability for performance of functional  activities.    Time 8    Period Weeks  Plan - 10/06/20 0924    Clinical Impression Statement Patient is doing very well.  Added supine cane exercise to increase left shoulder flexion which she performed with excellent technique.    Personal Factors and Comorbidities Comorbidity 1;Comorbidity 3+;Comorbidity 2    Comorbidities DM, Bi-polar, DJD, DM, ankle surgery.    Examination-Activity Limitations Other    Examination-Participation Restrictions Other    Stability/Clinical Decision Making Stable/Uncomplicated    Rehab Potential Excellent    PT Frequency 2x / week    PT Duration 8 weeks    PT Treatment/Interventions ADLs/Self Care Home Management;Cryotherapy;Electrical Stimulation;Moist Heat;Therapeutic activities;Therapeutic exercise;Manual techniques;Patient/family education;Passive range of motion;Vasopneumatic Device    PT Next Visit Plan Follow TSA protocol.  vasopneumatic on low with pillow between left elbow and thorax.           Patient will benefit from skilled therapeutic intervention in order to improve the following deficits and impairments:  Pain, Decreased activity tolerance, Decreased range of motion, Increased edema  Visit Diagnosis: Chronic left shoulder pain  Localized edema  Stiffness of left shoulder, not elsewhere classified     Problem List Patient Active Problem List   Diagnosis Date Noted  . Osteoarthritis of left shoulder 08/11/2020  . S/P shoulder replacement, left 08/11/2020  . AMS (altered mental status) 05/19/2019  . Thrombocytopenia ([place]) 05/19/2019  . SBO (small bowel obstruction) s/p lap LOA 03/19/2019 03/19/2019  . Chronic migraine without aura, with intractable migraine, so stated, with status migrainosus 11/20/2018  . Lumbar radiculopathy 10/11/2018  . Atelectasis   . Hypoglycemia   . Hypotension   . Bradycardia   . Epigastric pain 06/19/2018  . History of adenomatous polyp of colon 10/23/2017  . Hyperlipidemia  associated with type 2 diabetes mellitus ([place]) 07/06/2017  . GERD (gastroesophageal reflux disease) 03/21/2017  . History of Roux-en-Y gastric bypass 2018 03/21/2017  . Morbid obesity with BMI of 40.0-44.9, adult ([place]) 06/06/2016  . Intractable chronic migraine without aura 06/04/2015  . Abnormal uterine bleeding 05/13/2015  . Pancreatitis 02/27/2014  . Fatty liver disease, nonalcoholic 25/04/3975  . Bipolar disorder ([place]) 02/20/2014  . Metabolic syndrome 73/41/9379  . Vitamin D deficiency   . DM (diabetes mellitus) ([place]) 03/12/2013  . Surgery, elective 06/12/2011  . Hypothyroidism   . Constipation, chronic   . Vitamin B 12 deficiency   . ALLERGIC RHINITIS 12/31/2010  . BARRETTS ESOPHAGUS 12/31/2010  . Gastroparesis 12/31/2010  . Bilateral chronic knee pain 12/31/2010  . Obstructive sleep apnea 09/27/2010  . Migraine 09/27/2010  . RESPIRATORY FAILURE, CHRONIC 09/27/2010  . Cyst of ovary 10/19/2009    Lakyn Mantione, [place] MPT 10/06/2020, 9:27 AM  [facility] [address] [place], [place], [address] Phone: [phone]   Fax:  [phone]  Name: Morgan [name] MRN: [id number] Date of Birth: 1966/09/17

## 2020-10-07 ENCOUNTER — Other Ambulatory Visit: Payer: Self-pay

## 2020-10-07 ENCOUNTER — Ambulatory Visit: Payer: Medicare Other | Admitting: Physical Therapy

## 2020-10-07 DIAGNOSIS — G8929 Other chronic pain: Secondary | ICD-10-CM | POA: Diagnosis not present

## 2020-10-07 DIAGNOSIS — M25612 Stiffness of left shoulder, not elsewhere classified: Secondary | ICD-10-CM | POA: Diagnosis not present

## 2020-10-07 DIAGNOSIS — M25512 Pain in left shoulder: Secondary | ICD-10-CM | POA: Diagnosis not present

## 2020-10-07 DIAGNOSIS — R600 Localized edema: Secondary | ICD-10-CM

## 2020-10-07 NOTE — Therapy (Signed)
[facility] [place], [place], [address] Phone: [phone]   Fax:  [phone]  Physical Therapy Treatment  Patient Details  Name: Morgan [name] MRN: [id number] Date of Birth: 1966-09-20 Referring Provider (PT): Marchia Bond MD   Encounter Date: 10/07/2020   PT End of Session - 10/07/20 1015    Visit Number 4    Number of Visits 16    Date for PT Re-Evaluation 11/24/20    Authorization Type FOTO.    PT Start Time 0815    PT Stop Time 0902    PT Time Calculation (min) 47 min    Activity Tolerance Patient tolerated treatment well    Behavior During Therapy WFL for tasks assessed/performed           Past Medical History:  Diagnosis Date   Allergic rhinitis    Anemia     after gastric bypass in 2018   Anxiety    Barrett's esophagus    Bipolar affective disorder ([place])    CAP (community acquired pneumonia) 07/20/2018   Chronic respiratory failure ([facility])    Constipation, chronic    Degenerative joint disease of spine    Depression    Diabetes mellitus without complication ([place])    type 2    DM (diabetes mellitus) ([place])    "hypoglycemic" per pt - no longer takes DM meds due to weight loss from gastric bypass in 2018   Dry eye    Family history of adverse reaction to anesthesia    nausea and vomiting   Gastroparesis    GERD (gastroesophageal reflux disease)    History of bariatric surgery 03/2017   History of hiatal hernia    HLD (hyperlipidemia) 03/12/2013   Hyperlipidemia    Hypertension    No HTN meds since weight loss from Gastric Bypass in 2018   Intractable chronic migraine without aura 06/04/2015   Migraine headache    Morbid obesity ([facility])    OSA (obstructive sleep apnea)    No cpap since gastric surgery   Osteoarthritis    bilateral knee   SBO (small bowel obstruction) ([place]) 03/19/2019   Sleep apnea    Unspecified hypothyroidism    Vitamin B 12 deficiency    Vitamin  D deficiency     Past Surgical History:  Procedure Laterality Date   ANKLE ARTHROSCOPY WITH RECONSTRUCTION Right 09/24/2019   Procedure: ANKLE ARTHROSCOPY DEBRIDEMENT TREATMENT OF OSTEOCHONDRAL LESION TALUS. PRONEAL TENDON DEBRIDEMENT;  Surgeon: Erle Crocker, MD;  Location: [place] [facility];  Service: Orthopedics;  Laterality: Right;  SURGERY REQUEST TIME 2 HOURS   APPENDECTOMY  1978   CHOLECYSTECTOMY  2005   COLONOSCOPY     disc repair  05/17/2019   with rods, lumbar spine   GASTRIC ROUX-EN-Y N/A 03/21/2017   Procedure: LAPAROSCOPIC ROUX-EN-Y GASTRIC, UPPER ENDO;  Surgeon: Greer Pickerel, MD;  [facility];  Service: General;  Laterality: N/A;   GASTROSTOMY N/A 06/19/2018   Procedure: LAPRASCOPIC INSERTION OF GASTROSTOMY TUBE;  Surgeon: Greer Pickerel, MD;  [facility];  Service: General;  Laterality: N/A;   GASTROSTOMY TUBE PLACEMENT Left    06/2018   HERNIA REPAIR     HIATAL HERNIA REPAIR N/A 06/19/2018   Procedure: LAPAROSCOPIC REPAIR OF HIATAL HERNIA;  Surgeon: Greer Pickerel, MD;  [facility];  Service: General;  Laterality: N/A;   LAPAROSCOPIC LYSIS OF ADHESIONS  03/19/2019   Dr. Romana Juniper   LAPAROSCOPY N/A 06/19/2018   Procedure: LAPAROSCOPY DIAGNOSTIC;  Surgeon: Greer Pickerel, MD;  [facility];  Service: General;  Laterality: N/A;   LAPAROSCOPY N/A 03/19/2019   Procedure: LAPAROSCOPY DIAGNOSTIC lysis of adhesions;  Surgeon: Clovis Riley, MD;  [facility];  Service: General;  Laterality: N/A;   LUMBAR LAMINECTOMY/DECOMPRESSION MICRODISCECTOMY Right 10/11/2018   Procedure: LAMINECTOMY AND FORAMINOTOMY RIGHT LUMBAR FOUR- LUMBAR FIVE;  Surgeon: Consuella Lose, MD;  Location: [facility];  Service: Neurosurgery;  Laterality: Right;   SHOULDER ARTHROSCOPY  7/12   left-dsc   TONSILLECTOMY  at age 23   TOTAL SHOULDER ARTHROPLASTY Left 08/11/2020   Procedure: TOTAL SHOULDER ARTHROPLASTY;  Surgeon: Marchia Bond, MD;  Location: [place] [facility];  Service: Orthopedics;  Laterality: Left;   TRIGGER FINGER RELEASE  12/20/2012   Procedure: RELEASE TRIGGER FINGER/A-1 PULLEY;  Surgeon: Tennis Must, MD;  Location: [facility];  Service: Orthopedics;  Laterality: Left;  LEFT TRIGGER THUMB RELEASE    There were no vitals filed for this visit.   Subjective Assessment - 10/07/20 1014    Subjective COVID-19 screen performed prior to patient entering clinic.  Some complaints of pain into left middle deltoid.    Pertinent History DM, Bi-polar, DJD, DM, ankle surgery.    Patient Stated Goals Use left arm without pain.    Currently in Pain? Yes    Pain Score 3     Pain Location Shoulder    Pain Orientation Left    Pain Descriptors / Indicators Throbbing    Pain Type Surgical pain    Pain Onset More than a month ago                             [facility] Adult PT Treatment/Exercise - 10/07/20 0001      Exercises   Exercises Shoulder      Shoulder Exercises: Pulleys   Flexion 5 minutes    Other Pulley Exercises Seated UE ranger x 4 minutes.      Modalities   Modalities Academic librarian Stimulation Action IFC (non-motorc) x 15 minutes on 80-150 Hz x  at 100% scan.      Vasopneumatic   Number Minutes Vasopneumatic  15 minutes    Vasopnuematic Location  --   Left shoulder with pillow between thorax and elbow.     Manual Therapy   Manual Therapy Passive ROM    Passive ROM PROM performed in supine to patient's left shoulder x 14 minutes.                       PT Long Term Goals - 09/29/20 0913      PT LONG TERM GOAL #1   Title Independent with a HEP and progression.    Time 8    Period Weeks    Status New      PT LONG TERM GOAL #2   Title left shoulder flexion to 145 degrees so the patient can easily reach overhead.    Time 8    Period Weeks    Status New       PT LONG TERM GOAL #3   Title Active ER to 70 degrees+ to allow for easily donning/doffing of apparel.    Time 8    Period Weeks    Status New      PT LONG TERM GOAL #4  Title Increase ROM so patient is able to reach behind back to L3.    Time 8    Period Weeks    Status New      PT LONG TERM GOAL #5   Title Perform ADLs with pain not > 3/10.    Time 8    Period Weeks    Status New      PT LONG TERM GOAL #6   Title Increase shoulder strength to a solid 4+/5 to increase stability for performance of functional activities.    Time 8    Period Weeks                 Plan - 10/07/20 1020    Clinical Impression Statement The patient is progressing very well per protocol.  She has ome referred pain into her left middle deltoid.  She enjoyed low-level electrical stimulation.    Personal Factors and Comorbidities Comorbidity 1;Comorbidity 3+;Comorbidity 2    Comorbidities DM, Bi-polar, DJD, DM, ankle surgery.    Examination-Activity Limitations Other    Examination-Participation Restrictions Other    Stability/Clinical Decision Making Stable/Uncomplicated    Rehab Potential Excellent    PT Frequency 2x / week    PT Duration 8 weeks    PT Treatment/Interventions ADLs/Self Care Home Management;Cryotherapy;Electrical Stimulation;Moist Heat;Therapeutic activities;Therapeutic exercise;Manual techniques;Patient/family education;Passive range of motion;Vasopneumatic Device    PT Next Visit Plan Follow TSA protocol.  vasopneumatic on low with pillow between left elbow and thorax.    Consulted and Agree with Plan of Care Patient           Patient will benefit from skilled therapeutic intervention in order to improve the following deficits and impairments:  Pain, Decreased activity tolerance, Decreased range of motion, Increased edema  Visit Diagnosis: Chronic left shoulder pain  Localized edema  Stiffness of left shoulder, not elsewhere classified     Problem List Patient  Active Problem List   Diagnosis Date Noted   Osteoarthritis of left shoulder 08/11/2020   S/P shoulder replacement, left 08/11/2020   AMS (altered mental status) 05/19/2019   Thrombocytopenia ([place]) 05/19/2019   SBO (small bowel obstruction) s/p lap LOA 03/19/2019 03/19/2019   Chronic migraine without aura, with intractable migraine, so stated, with status migrainosus 11/20/2018   Lumbar radiculopathy 10/11/2018   Atelectasis    Hypoglycemia    Hypotension    Bradycardia    Epigastric pain 06/19/2018   History of adenomatous polyp of colon 10/23/2017   Hyperlipidemia associated with type 2 diabetes mellitus ([place]) 07/06/2017   GERD (gastroesophageal reflux disease) 03/21/2017   History of Roux-en-Y gastric bypass 2018 03/21/2017   Morbid obesity with BMI of 40.0-44.9, adult ([place]) 06/06/2016   Intractable chronic migraine without aura 06/04/2015   Abnormal uterine bleeding 05/13/2015   Pancreatitis 02/27/2014   Fatty liver disease, nonalcoholic 29/52/8413   Bipolar disorder ([place]) 24/40/1027   Metabolic syndrome 25/36/6440   Vitamin D deficiency    DM (diabetes mellitus) ([place]) 03/12/2013   Surgery, elective 06/12/2011   Hypothyroidism    Constipation, chronic    Vitamin B 12 deficiency    ALLERGIC RHINITIS 12/31/2010   BARRETTS ESOPHAGUS 12/31/2010   Gastroparesis 12/31/2010   Bilateral chronic knee pain 12/31/2010   Obstructive sleep apnea 09/27/2010   Migraine 09/27/2010   RESPIRATORY FAILURE, CHRONIC 09/27/2010   Cyst of ovary 10/19/2009    Zeplin Aleshire, [place] MPT 10/07/2020, 10:23 AM  [facility] [address] [place], [place], [address] Phone: [phone]   Fax:  [phone]  Name: LADEJA PELHAM MRN: [id number] Date of Birth: 08/01/1966

## 2020-10-13 ENCOUNTER — Other Ambulatory Visit: Payer: Self-pay

## 2020-10-13 ENCOUNTER — Ambulatory Visit: Payer: Medicare Other | Attending: Orthopedic Surgery | Admitting: *Deleted

## 2020-10-13 DIAGNOSIS — G8929 Other chronic pain: Secondary | ICD-10-CM | POA: Insufficient documentation

## 2020-10-13 DIAGNOSIS — M25612 Stiffness of left shoulder, not elsewhere classified: Secondary | ICD-10-CM | POA: Diagnosis not present

## 2020-10-13 DIAGNOSIS — R600 Localized edema: Secondary | ICD-10-CM | POA: Diagnosis not present

## 2020-10-13 DIAGNOSIS — M25512 Pain in left shoulder: Secondary | ICD-10-CM | POA: Diagnosis not present

## 2020-10-13 NOTE — Therapy (Signed)
[facility] [place], [place], [address] Phone: [phone]   Fax:  [phone]  Physical Therapy Treatment  Patient Details  Name: Morgan [name] MRN: [id number] Date of Birth: 1966/05/10 Referring Provider (PT): Marchia Bond MD   Encounter Date: 10/13/2020   PT End of Session - 10/13/20 0809    Visit Number 5    Number of Visits 16    Date for PT Re-Evaluation 11/24/20    Authorization Type FOTO.    PT Start Time 0815    PT Stop Time 0908    PT Time Calculation (min) 53 min           Past Medical History:  Diagnosis Date  . Allergic rhinitis   . Anemia     after gastric bypass in 2018  . Anxiety   . Barrett's esophagus   . Bipolar affective disorder ([place])   . CAP (community acquired pneumonia) 07/20/2018  . Chronic respiratory failure ([place])   . Constipation, chronic   . Degenerative joint disease of spine   . Depression   . Diabetes mellitus without complication ([place])    type 2   . DM (diabetes mellitus) ([place])    "hypoglycemic" per pt - no longer takes DM meds due to weight loss from gastric bypass in 2018  . Dry eye   . Family history of adverse reaction to anesthesia    nausea and vomiting  . Gastroparesis   . GERD (gastroesophageal reflux disease)   . History of bariatric surgery 03/2017  . History of hiatal hernia   . HLD (hyperlipidemia) 03/12/2013  . Hyperlipidemia   . Hypertension    No HTN meds since weight loss from Gastric Bypass in 2018  . Intractable chronic migraine without aura 06/04/2015  . Migraine headache   . Morbid obesity ([place])   . OSA (obstructive sleep apnea)    No cpap since gastric surgery  . Osteoarthritis    bilateral knee  . SBO (small bowel obstruction) ([place]) 03/19/2019  . Sleep apnea   . Unspecified hypothyroidism   . Vitamin B 12 deficiency   . Vitamin D deficiency     Past Surgical History:  Procedure Laterality Date  . ANKLE ARTHROSCOPY WITH RECONSTRUCTION Right  09/24/2019   Procedure: ANKLE ARTHROSCOPY DEBRIDEMENT TREATMENT OF OSTEOCHONDRAL LESION TALUS. PRONEAL TENDON DEBRIDEMENT;  Surgeon: Erle Crocker, MD;  Location: [place] [facility];  Service: Orthopedics;  Laterality: Right;  SURGERY REQUEST TIME 2 HOURS  . APPENDECTOMY  1978  . CHOLECYSTECTOMY  2005  . COLONOSCOPY    . disc repair  05/17/2019   with rods, lumbar spine  . GASTRIC ROUX-EN-Y N/A 03/21/2017   Procedure: LAPAROSCOPIC ROUX-EN-Y GASTRIC, UPPER ENDO;  Surgeon: Greer Pickerel, MD;  [facility];  Service: General;  Laterality: N/A;  . GASTROSTOMY N/A 06/19/2018   Procedure: LAPRASCOPIC INSERTION OF GASTROSTOMY TUBE;  Surgeon: Greer Pickerel, MD;  Location[facility];  Service: General;  Laterality: N/A;  . GASTROSTOMY TUBE PLACEMENT Left    06/2018  . HERNIA REPAIR    . HIATAL HERNIA REPAIR N/A 06/19/2018   Procedure: LAPAROSCOPIC REPAIR OF HIATAL HERNIA;  Surgeon: Greer Pickerel, MD;  [facility];  Service: General;  Laterality: N/A;  . LAPAROSCOPIC LYSIS OF ADHESIONS  03/19/2019   Dr. Romana Juniper  . LAPAROSCOPY N/A 06/19/2018   Procedure: LAPAROSCOPY DIAGNOSTIC;  Surgeon: Greer Pickerel, MD;  [facility];  Service: General;  Laterality: N/A;  . LAPAROSCOPY N/A 03/19/2019  Procedure: LAPAROSCOPY DIAGNOSTIC lysis of adhesions;  Surgeon: Clovis Riley, MD;  [facility];  Service: General;  Laterality: N/A;  . LUMBAR LAMINECTOMY/DECOMPRESSION MICRODISCECTOMY Right 10/11/2018   Procedure: LAMINECTOMY AND FORAMINOTOMY RIGHT LUMBAR FOUR- LUMBAR FIVE;  Surgeon: Consuella Lose, MD;  Location: [facility];  Service: Neurosurgery;  Laterality: Right;  . SHOULDER ARTHROSCOPY  7/12   left-dsc  . TONSILLECTOMY  at age 74  . TOTAL SHOULDER ARTHROPLASTY Left 08/11/2020   Procedure: TOTAL SHOULDER ARTHROPLASTY;  Surgeon: Marchia Bond, MD;  Location: [facility];  Service: Orthopedics;  Laterality: Left;  . TRIGGER FINGER RELEASE  12/20/2012   Procedure: RELEASE  TRIGGER FINGER/A-1 PULLEY;  Surgeon: Tennis Must, MD;  Location: [place] [facility];  Service: Orthopedics;  Laterality: Left;  LEFT TRIGGER THUMB RELEASE    There were no vitals filed for this visit.   Subjective Assessment - 10/13/20 0807    Subjective COVID-19 screen performed prior to patient entering clinic. Did good after last RX    Pertinent History DM, Bi-polar, DJD, DM, ankle surgery.    Patient Stated Goals Use left arm without pain.    Currently in Pain? Yes    Pain Score 3     Pain Descriptors / Indicators Throbbing    Pain Type Surgical pain    Pain Onset More than a month ago                             [facility] Adult PT Treatment/Exercise - 10/13/20 0001      Exercises   Exercises Shoulder      Shoulder Exercises: Standing   External Rotation Strengthening;Left;10 reps;20 reps    Theraband Level (Shoulder External Rotation) Level 1 (Yellow)    Internal Rotation Strengthening;Left;10 reps;20 reps   3x10   Theraband Level (Shoulder Internal Rotation) Level 1 (Yellow)      Shoulder Exercises: Pulleys   Flexion 5 minutes    Other Pulley Exercises Seated UE ranger  flexion/ extension and CW/ CCW x 5 minutes.      Shoulder Exercises: Isometric Strengthening   External Rotation 5X5"    Internal Rotation 5X5"      Modalities   Modalities Electrical Stimulation;Vasopneumatic      Vasopneumatic   Number Minutes Vasopneumatic  10 minutes    Vasopnuematic Location  Shoulder    Vasopneumatic Pressure Low    Vasopneumatic Temperature  34 for edema      Manual Therapy   Manual Therapy Passive ROM    Passive ROM PROM/ AAROM performed in supine to patient's left shoulder . Rhythmic stab for elevation above 90 degrees and ER/IR in scaption  at 70 degrees abduction                       PT Long Term Goals - 09/29/20 0913      PT LONG TERM GOAL #1   Title Independent with a HEP and progression.    Time 8    Period Weeks     Status New      PT LONG TERM GOAL #2   Title left shoulder flexion to 145 degrees so the patient can easily reach overhead.    Time 8    Period Weeks    Status New      PT LONG TERM GOAL #3   Title Active ER to 70 degrees+ to allow for easily donning/doffing of apparel.  Time 8    Period Weeks    Status New      PT LONG TERM GOAL #4   Title Increase ROM so patient is able to reach behind back to L3.    Time 8    Period Weeks    Status New      PT LONG TERM GOAL #5   Title Perform ADL's with pain not > 3/10.    Time 8    Period Weeks    Status New      PT LONG TERM GOAL #6   Title Increase shoulder strength to a solid 4+/5 to increase stability for performance of functional activities.    Time 8    Period Weeks                 Plan - 10/13/20 0809    Clinical Impression Statement Pt arrived today doing fairly well with LT shldr. She is 9 weeks post-op now and did well with AAROM exs as well as rhythmic stab and light IR/ER strengthening exs.    Personal Factors and Comorbidities Comorbidity 1;Comorbidity 3+;Comorbidity 2    Comorbidities DM, Bi-polar, DJD, DM, ankle surgery.    Examination-Participation Restrictions Other    Stability/Clinical Decision Making Stable/Uncomplicated    Rehab Potential Excellent    PT Frequency 2x / week    PT Treatment/Interventions ADLs/Self Care Home Management;Cryotherapy;Electrical Stimulation;Moist Heat;Therapeutic activities;Therapeutic exercise;Manual techniques;Patient/family education;Passive range of motion;Vasopneumatic Device    PT Next Visit Plan Follow TSA protocol.  (9 weeks 10-13-20)    vasopneumatic on low with pillow between left elbow and thorax.           Patient will benefit from skilled therapeutic intervention in order to improve the following deficits and impairments:  Pain, Decreased activity tolerance, Decreased range of motion, Increased edema  Visit Diagnosis: Chronic left shoulder pain  Localized  edema  Stiffness of left shoulder, not elsewhere classified     Problem List Patient Active Problem List   Diagnosis Date Noted  . Osteoarthritis of left shoulder 08/11/2020  . S/P shoulder replacement, left 08/11/2020  . AMS (altered mental status) 05/19/2019  . Thrombocytopenia ([place]) 05/19/2019  . SBO (small bowel obstruction) s/p lap LOA 03/19/2019 03/19/2019  . Chronic migraine without aura, with intractable migraine, so stated, with status migrainosus 11/20/2018  . Lumbar radiculopathy 10/11/2018  . Atelectasis   . Hypoglycemia   . Hypotension   . Bradycardia   . Epigastric pain 06/19/2018  . History of adenomatous polyp of colon 10/23/2017  . Hyperlipidemia associated with type 2 diabetes mellitus ([place]) 07/06/2017  . GERD (gastroesophageal reflux disease) 03/21/2017  . History of Roux-en-Y gastric bypass 2018 03/21/2017  . Morbid obesity with BMI of 40.0-44.9, adult ([place]) 06/06/2016  . Intractable chronic migraine without aura 06/04/2015  . Abnormal uterine bleeding 05/13/2015  . Pancreatitis 02/27/2014  . Fatty liver disease, nonalcoholic 73/22/0254  . Bipolar disorder ([place]) 02/20/2014  . Metabolic syndrome 27/05/2375  . Vitamin D deficiency   . DM (diabetes mellitus) ([place]) 03/12/2013  . Surgery, elective 06/12/2011  . Hypothyroidism   . Constipation, chronic   . Vitamin B 12 deficiency   . ALLERGIC RHINITIS 12/31/2010  . BARRETTS ESOPHAGUS 12/31/2010  . Gastroparesis 12/31/2010  . Bilateral chronic knee pain 12/31/2010  . Obstructive sleep apnea 09/27/2010  . Migraine 09/27/2010  . RESPIRATORY FAILURE, CHRONIC 09/27/2010  . Cyst of ovary 10/19/2009    Morgan [name],Morgan [name], Morgan [name] 10/13/2020, 11:53 AM  [facility] [address]  [place], [place], [address] Phone: [phone]   Fax:  [phone]  Name: Morgan [name] MRN: [id number] Date of Birth: December 17, 1965

## 2020-10-16 ENCOUNTER — Encounter: Payer: Medicare Other | Admitting: *Deleted

## 2020-10-19 ENCOUNTER — Other Ambulatory Visit: Payer: Self-pay

## 2020-10-19 ENCOUNTER — Ambulatory Visit: Payer: Medicare Other | Admitting: Physical Therapy

## 2020-10-19 DIAGNOSIS — G8929 Other chronic pain: Secondary | ICD-10-CM | POA: Diagnosis not present

## 2020-10-19 DIAGNOSIS — M25612 Stiffness of left shoulder, not elsewhere classified: Secondary | ICD-10-CM | POA: Diagnosis not present

## 2020-10-19 DIAGNOSIS — R600 Localized edema: Secondary | ICD-10-CM

## 2020-10-19 DIAGNOSIS — M25512 Pain in left shoulder: Secondary | ICD-10-CM | POA: Diagnosis not present

## 2020-10-19 NOTE — Therapy (Signed)
[facility] [place], [place], [address] Phone: [phone]   Fax:  [phone]  Physical Therapy Treatment  Patient Details  Name: Morgan [name] MRN: [id number] Date of Birth: Oct 21, 1966 Referring Provider (PT): Marchia Bond MD   Encounter Date: 10/19/2020   PT End of Session - 10/19/20 0852    Visit Number 6    Number of Visits 16    Date for PT Re-Evaluation 11/24/20    Authorization Type FOTO.    PT Start Time [phone]    PT Stop Time 0901    PT Time Calculation (min) 45 min           Past Medical History:  Diagnosis Date  . Allergic rhinitis   . Anemia     after gastric bypass in 2018  . Anxiety   . Barrett's esophagus   . Bipolar affective disorder ([place])   . CAP (community acquired pneumonia) 07/20/2018  . Chronic respiratory failure ([place])   . Constipation, chronic   . Degenerative joint disease of spine   . Depression   . Diabetes mellitus without complication ([place])    type 2   . DM (diabetes mellitus) ([place])    "hypoglycemic" per pt - no longer takes DM meds due to weight loss from gastric bypass in 2018  . Dry eye   . Family history of adverse reaction to anesthesia    nausea and vomiting  . Gastroparesis   . GERD (gastroesophageal reflux disease)   . History of bariatric surgery 03/2017  . History of hiatal hernia   . HLD (hyperlipidemia) 03/12/2013  . Hyperlipidemia   . Hypertension    No HTN meds since weight loss from Gastric Bypass in 2018  . Intractable chronic migraine without aura 06/04/2015  . Migraine headache   . Morbid obesity ([place])   . OSA (obstructive sleep apnea)    No cpap since gastric surgery  . Osteoarthritis    bilateral knee  . SBO (small bowel obstruction) ([place]) 03/19/2019  . Sleep apnea   . Unspecified hypothyroidism   . Vitamin B 12 deficiency   . Vitamin D deficiency     Past Surgical History:  Procedure Laterality Date  . ANKLE ARTHROSCOPY WITH RECONSTRUCTION Right  09/24/2019   Procedure: ANKLE ARTHROSCOPY DEBRIDEMENT TREATMENT OF OSTEOCHONDRAL LESION TALUS. PRONEAL TENDON DEBRIDEMENT;  Surgeon: Erle Crocker, MD;  Location: [facility];  Service: Orthopedics;  Laterality: Right;  SURGERY REQUEST TIME 2 HOURS  . APPENDECTOMY  1978  . CHOLECYSTECTOMY  2005  . COLONOSCOPY    . disc repair  05/17/2019   with rods, lumbar spine  . GASTRIC ROUX-EN-Y N/A 03/21/2017   Procedure: LAPAROSCOPIC ROUX-EN-Y GASTRIC, UPPER ENDO;  Surgeon: Greer Pickerel, MD;  [facility];  Service: General;  Laterality: N/A;  . GASTROSTOMY N/A 06/19/2018   Procedure: LAPRASCOPIC INSERTION OF GASTROSTOMY TUBE;  Surgeon: Greer Pickerel, MD;  [facility];  Service: General;  Laterality: N/A;  . GASTROSTOMY TUBE PLACEMENT Left    06/2018  . HERNIA REPAIR    . HIATAL HERNIA REPAIR N/A 06/19/2018   Procedure: LAPAROSCOPIC REPAIR OF HIATAL HERNIA;  Surgeon: Greer Pickerel, MD;  [facility];  Service: General;  Laterality: N/A;  . LAPAROSCOPIC LYSIS OF ADHESIONS  03/19/2019   Dr. Romana Juniper  . LAPAROSCOPY N/A 06/19/2018   Procedure: LAPAROSCOPY DIAGNOSTIC;  Surgeon: Greer Pickerel, MD;  [facility];  Service: General;  Laterality: N/A;  . LAPAROSCOPY N/A 03/19/2019  Procedure: LAPAROSCOPY DIAGNOSTIC lysis of adhesions;  Surgeon: Clovis Riley, MD;  [facility];  Service: General;  Laterality: N/A;  . LUMBAR LAMINECTOMY/DECOMPRESSION MICRODISCECTOMY Right 10/11/2018   Procedure: LAMINECTOMY AND FORAMINOTOMY RIGHT LUMBAR FOUR- LUMBAR FIVE;  Surgeon: Consuella Lose, MD;  Location: [facility];  Service: Neurosurgery;  Laterality: Right;  . SHOULDER ARTHROSCOPY  7/12   left-dsc  . TONSILLECTOMY  at age 42  . TOTAL SHOULDER ARTHROPLASTY Left 08/11/2020   Procedure: TOTAL SHOULDER ARTHROPLASTY;  Surgeon: Marchia Bond, MD;  Location: [place] [facility];  Service: Orthopedics;  Laterality: Left;  . TRIGGER FINGER RELEASE  12/20/2012   Procedure: RELEASE  TRIGGER FINGER/A-1 PULLEY;  Surgeon: Tennis Must, MD;  Location: [facility];  Service: Orthopedics;  Laterality: Left;  LEFT TRIGGER THUMB RELEASE    There were no vitals filed for this visit.   Subjective Assessment - 10/19/20 0852    Subjective COVID-19 screen performed prior to patient entering clinic.  Pain low.    Pertinent History DM, Bi-polar, DJD, DM, ankle surgery.    Patient Stated Goals Use left arm without pain.    Currently in Pain? Yes    Pain Score 2     Pain Location Shoulder    Pain Orientation Left    Pain Descriptors / Indicators Throbbing    Pain Type Surgical pain    Pain Onset More than a month ago                             [facility] Adult PT Treatment/Exercise - 10/19/20 0001      Exercises   Exercises Shoulder      Shoulder Exercises: Pulleys   Flexion 5 minutes    Other Pulley Exercises Wall ladder x 5 minutes f/b ball on wall x 3 minutes.      Vasopneumatic   Number Minutes Vasopneumatic  15 minutes    Vasopnuematic Location  --   LT shoulder with pillow betweem elbow and thorax.   Vasopneumatic Pressure Low      Manual Therapy   Manual Therapy Passive ROM    Passive ROM PROM with low load long duration stretching to patient's left shoulder in supine x 10 minutes.                       PT Long Term Goals - 09/29/20 0913      PT LONG TERM GOAL #1   Title Independent with a HEP and progression.    Time 8    Period Weeks    Status New      PT LONG TERM GOAL #2   Title left shoulder flexion to 145 degrees so the patient can easily reach overhead.    Time 8    Period Weeks    Status New      PT LONG TERM GOAL #3   Title Active ER to 70 degrees+ to allow for easily donning/doffing of apparel.    Time 8    Period Weeks    Status New      PT LONG TERM GOAL #4   Title Increase ROM so patient is able to reach behind back to L3.    Time 8    Period Weeks    Status New      PT LONG TERM GOAL  #5   Title Perform ADL's with pain not > 3/10.    Time  8    Period Weeks    Status New      PT LONG TERM GOAL #6   Title Increase shoulder strength to a solid 4+/5 to increase stability for performance of functional activities.    Time 8    Period Weeks                 Plan - 10/19/20 7124    Clinical Impression Statement The patient is making excellent progress per protocol.  She is expected to meet all goals.    Personal Factors and Comorbidities Comorbidity 1;Comorbidity 3+;Comorbidity 2    Comorbidities DM, Bi-polar, DJD, DM, ankle surgery.    Examination-Participation Restrictions Other    Stability/Clinical Decision Making Stable/Uncomplicated    Rehab Potential Excellent    PT Frequency 2x / week    PT Duration 8 weeks    PT Treatment/Interventions ADLs/Self Care Home Management;Cryotherapy;Electrical Stimulation;Moist Heat;Therapeutic activities;Therapeutic exercise;Manual techniques;Patient/family education;Passive range of motion;Vasopneumatic Device    PT Next Visit Plan Follow TSA protocol.  (9 weeks 10-13-20)    vasopneumatic on low with pillow between left elbow and thorax.    Consulted and Agree with Plan of Care Patient           Patient will benefit from skilled therapeutic intervention in order to improve the following deficits and impairments:  Pain, Decreased activity tolerance, Decreased range of motion, Increased edema  Visit Diagnosis: Chronic left shoulder pain  Localized edema  Stiffness of left shoulder, not elsewhere classified     Problem List Patient Active Problem List   Diagnosis Date Noted  . Osteoarthritis of left shoulder 08/11/2020  . S/P shoulder replacement, left 08/11/2020  . AMS (altered mental status) 05/19/2019  . Thrombocytopenia ([place]) 05/19/2019  . SBO (small bowel obstruction) s/p lap LOA 03/19/2019 03/19/2019  . Chronic migraine without aura, with intractable migraine, so stated, with status migrainosus 11/20/2018  .  Lumbar radiculopathy 10/11/2018  . Atelectasis   . Hypoglycemia   . Hypotension   . Bradycardia   . Epigastric pain 06/19/2018  . History of adenomatous polyp of colon 10/23/2017  . Hyperlipidemia associated with type 2 diabetes mellitus ([place]) 07/06/2017  . GERD (gastroesophageal reflux disease) 03/21/2017  . History of Roux-en-Y gastric bypass 2018 03/21/2017  . Morbid obesity with BMI of 40.0-44.9, adult ([place]) 06/06/2016  . Intractable chronic migraine without aura 06/04/2015  . Abnormal uterine bleeding 05/13/2015  . Pancreatitis 02/27/2014  . Fatty liver disease, nonalcoholic 58/08/9832  . Bipolar disorder ([place]) 02/20/2014  . Metabolic syndrome 82/50/5397  . Vitamin D deficiency   . DM (diabetes mellitus) ([place]) 03/12/2013  . Surgery, elective 06/12/2011  . Hypothyroidism   . Constipation, chronic   . Vitamin B 12 deficiency   . ALLERGIC RHINITIS 12/31/2010  . BARRETTS ESOPHAGUS 12/31/2010  . Gastroparesis 12/31/2010  . Bilateral chronic knee pain 12/31/2010  . Obstructive sleep apnea 09/27/2010  . Migraine 09/27/2010  . RESPIRATORY FAILURE, CHRONIC 09/27/2010  . Cyst of ovary 10/19/2009    Donavon Kimrey, [place] MPT 10/19/2020, 9:21 AM  [facility] [address] [place], [place], [address] Phone: [phone]   Fax:  [phone]  Name: Morgan [name] MRN: [id number] Date of Birth: [birth year]

## 2020-10-19 NOTE — Progress Notes (Addendum)
PATIENT: Morgan [name] DOB: [birth year]  REASON FOR VISIT: follow up HISTORY FROM: patient  Virtual Visit via Telephone Note  I connected with Morgan [name] on 10/20/20 at  8:00 AM EST by telephone and verified that I am speaking with the correct person using two identifiers.   I discussed the limitations, risks, security and privacy concerns of performing an evaluation and management service by telephone and the availability of in person appointments. I also discussed with the patient that there may be a patient responsible charge related to this service. The patient expressed understanding and agreed to proceed.   History of Present Illness:  10/20/20 Morgan [name] is a 54 y.o. female here today for follow up for migraines. We continues divalproex, gabapentin and Emgality. She was started on rizatriptan for abortive therapy at last follow up. She reports that she is doing very well. She has 2-3 headache days per month on average. Rizatriptan has worked very well for abortive therapy. She is tolerating medications well with no obvious adverse effects. She had left shoulder replacement in 07/2020 and is recovering well.   History (copied from my note on 04/14/2020)  Morgan [name] is a 54 y.o. female here today for follow up of migraines.  She continues divalproex and gabapentin as prescribed.  We switched her to [facility] in February she reports that headaches have decreased significantly in intensity and frequency.  She had 4 headaches in the month of April.  She continues to follow with orthopedics closely for neck pain.  She has been dissipated in physical therapy and received 1 steroid injection.  She does not feel that either were very beneficial.  She has tried dry needling with no success.  History (copied from my note on 01/14/2020)  Morgan [name] a 55 y.o.femalehere today for follow up.She continues divalproex 500mg  and 1000mg  at night, gabapentin 600mg  in the am,  300mg  at lunch and 600mg  at bedtime and Ajovy monthly. She does feel that headaches are less severe but continue daily. She feels that pounding headaches are much less severe. She is tolerating medications well, however, insurance is requiring that she try Emgality. They will no longer pay for Ajovy.Insurance will cover [employer]. She is willing to switch medications. She continues to have chronic neck pain managed by orthopedics at this time. She is current treating with steroid injections.  History (copied from Dr Cathren Laine note on 05/22/2019)  Interval history: 24 headache days a month. Most are migraines. 15 are severe. Ajovy may be helping a little, she has had so much going on she doesn't know if that is contributing. She had emergency surgery in April and recent back surgery. She is taking oxycontin and 325mg  tylenol. She will keep on the Ajovy and when all the issues are calmed down she will call us and discuss nerve blocks.   Observations/Objective:  Generalized: Well developed, in no acute distress  Mentation: Alert oriented to time, place, history taking. Follows all commands speech and language fluent   Assessment and Plan:  54 y.o. year old female  has a past medical history of Allergic rhinitis, Anemia, Anxiety, Barrett's esophagus, Bipolar affective disorder ([place]), CAP (community acquired pneumonia) (07/20/2018), Chronic respiratory failure ([place]), Constipation, chronic, Degenerative joint disease of spine, Depression, Diabetes mellitus without complication ([place]), DM (diabetes mellitus) ([place]), Dry eye, Family history of adverse reaction to anesthesia, Gastroparesis, GERD (gastroesophageal reflux disease), History of bariatric surgery (03/2017), History of hiatal hernia, HLD (hyperlipidemia) (03/12/2013), Hyperlipidemia, Hypertension, Intractable chronic  migraine without aura (06/04/2015), Migraine headache, Morbid obesity ([place]), OSA (obstructive sleep apnea), Osteoarthritis, SBO (small bowel  obstruction) ([place]) (03/19/2019), Sleep apnea, Unspecified hypothyroidism, Vitamin B 12 deficiency, and Vitamin D deficiency. here with    ICD-10-CM   1. Migraine without aura and without status migrainosus, not intractable  G43.009    Morgan [name] is doing very well, today.  Migraines are well managed on current treatment plan.  We will continue divalproex, gabapentin and Emgality as prescribed.  She will continue rizatriptan as needed for abortive therapy.  She will continue to focus on healthy lifestyle habits.  Follow-up with care team as advised.  She will return to see me in 1 year for migraine follow-up.  She is aware she may call sooner if needed.  She verbalizes understanding and agreement with this plan.  No orders of the defined types were placed in this encounter.   Meds ordered this encounter  Medications  . gabapentin (NEURONTIN) 300 MG capsule    Sig: Takes 2 capsules in morning, one cap at lunch, 2 caps at bedtime    Dispense:  450 capsule    Refill:  3    Order Specific Question:   Supervising Provider    Answer:   Melvenia Beam [id number]  . divalproex (DEPAKOTE) 500 MG DR tablet    Sig: Take 500 mg by mouth in the morning and 1000 mg at bedtime    Dispense:  270 tablet    Refill:  3    Order Specific Question:   Supervising Provider    Answer:   Melvenia Beam [id number]  . Galcanezumab-gnlm (EMGALITY) 120 MG/ML SOAJ    Sig: Inject 120 mg into the skin every 30 (thirty) days.    Dispense:  3 mL    Refill:  3    Order Specific Question:   Supervising Provider    Answer:   Melvenia Beam [id number]     Follow Up Instructions:  I discussed the assessment and treatment plan with the patient. The patient was provided an opportunity to ask questions and all were answered. The patient agreed with the plan and demonstrated an understanding of the instructions.   The patient was advised to call back or seek an in-person evaluation if the symptoms worsen or if the condition  fails to improve as anticipated.  I provided 15 minutes of non-face-to-face time during this encounter. Patient is located at her place of residence, provider is in the office.    Debbora Presto, NP   Made any corrections needed, and agree with history, physical, neuro exam,assessment and plan as stated.     Sarina Ill, MD [facility]

## 2020-10-20 ENCOUNTER — Encounter: Payer: Self-pay | Admitting: Family Medicine

## 2020-10-20 ENCOUNTER — Telehealth (INDEPENDENT_AMBULATORY_CARE_PROVIDER_SITE_OTHER): Payer: Medicare Other | Admitting: Family Medicine

## 2020-10-20 DIAGNOSIS — G43009 Migraine without aura, not intractable, without status migrainosus: Secondary | ICD-10-CM

## 2020-10-20 MED ORDER — DIVALPROEX SODIUM 500 MG PO DR TAB: DELAYED_RELEASE_TABLET | ORAL | 3 refills | Status: DC

## 2020-10-20 MED ORDER — GABAPENTIN 300 MG PO CAPS: ORAL_CAPSULE | ORAL | 3 refills | Status: DC

## 2020-10-20 MED ORDER — EMGALITY 120 MG/ML ~~LOC~~ SOAJ: 120.0000 mg | SUBCUTANEOUS | 3 refills | Status: DC

## 2020-10-27 ENCOUNTER — Ambulatory Visit: Payer: Medicare Other | Admitting: *Deleted

## 2020-11-23 DIAGNOSIS — Z1231 Encounter for screening mammogram for malignant neoplasm of breast: Secondary | ICD-10-CM | POA: Diagnosis not present

## 2020-11-24 ENCOUNTER — Encounter: Payer: Self-pay | Admitting: Family Medicine

## 2020-11-24 ENCOUNTER — Ambulatory Visit (INDEPENDENT_AMBULATORY_CARE_PROVIDER_SITE_OTHER): Payer: Medicare Other | Admitting: Family Medicine

## 2020-11-24 ENCOUNTER — Other Ambulatory Visit: Payer: Self-pay

## 2020-11-24 VITALS — BP 123/82 | HR 67 | Temp 97.6°F | Ht 64.0 in | Wt 191.0 lb

## 2020-11-24 DIAGNOSIS — E119 Type 2 diabetes mellitus without complications: Secondary | ICD-10-CM

## 2020-11-24 DIAGNOSIS — E1169 Type 2 diabetes mellitus with other specified complication: Secondary | ICD-10-CM

## 2020-11-24 DIAGNOSIS — I152 Hypertension secondary to endocrine disorders: Secondary | ICD-10-CM

## 2020-11-24 DIAGNOSIS — E1159 Type 2 diabetes mellitus with other circulatory complications: Secondary | ICD-10-CM | POA: Diagnosis not present

## 2020-11-24 DIAGNOSIS — N3281 Overactive bladder: Secondary | ICD-10-CM

## 2020-11-24 DIAGNOSIS — E039 Hypothyroidism, unspecified: Secondary | ICD-10-CM

## 2020-11-24 DIAGNOSIS — M25512 Pain in left shoulder: Secondary | ICD-10-CM | POA: Diagnosis not present

## 2020-11-24 DIAGNOSIS — E785 Hyperlipidemia, unspecified: Secondary | ICD-10-CM

## 2020-11-24 LAB — BAYER DCA HB A1C WAIVED: HB A1C (BAYER DCA - WAIVED): 5.8 % (ref <7.0)

## 2020-11-24 MED ORDER — METHYLPREDNISOLONE ACETATE 40 MG/ML IJ SUSP
40.0000 mg | Freq: Once | INTRAMUSCULAR | Status: AC
  Administered 2020-11-24: 09:00:00 40 mg via INTRAMUSCULAR

## 2020-11-24 MED ORDER — MIRABEGRON ER 25 MG PO TB24: 25.0000 mg | ORAL_TABLET | Freq: Every day | ORAL | 12 refills | Status: DC

## 2020-11-24 NOTE — Progress Notes (Signed)
Subjective: CC: f/u DM, thyroid PCP: Janora Norlander, DO UQJ:FHLKT R Leite is a 54 y.o. female presenting to clinic today for:  1. Type 2 Diabetes with hypertension, hyperlipidemia:  Continues to have intermittent hypoglycemic episodes.  These are infrequent but still occur where her sugars will get down into the 40s and even 60s.  This typically seems to be related to consumption of carbohydrates.  Last eye exam: Up-to-date Last foot exam: Up-to-date Last A1c:  Lab Results  Component Value Date   HGBA1C 5.8 (H) 08/04/2020   Nephropathy screen indicated?:  Up-to-date Last flu, zoster and/or pneumovax:  Immunization History  Administered Date(s) Administered  . Influenza Inj Mdck Quad With Preservative 09/11/2017  . Influenza,inj,Quad PF,6+ Mos 09/29/2016, 09/07/2017, 09/11/2018  . Influenza,inj,quad, With Preservative 09/17/2019  . Influenza-Unspecified 09/11/2016  . Moderna Sars-Covid-2 Vaccination 03/02/2020, 03/30/2020, 10/14/2020  . Pneumococcal Polysaccharide-23 06/29/2016  . Td 07/09/2015  . Tdap 07/09/2015  . Zoster Recombinat (Shingrix) 10/06/2017, 01/04/2018    ROS: No loss of consciousness, chest pain or shortness of breath.  She reports urinary frequency, nocturia.  2. Hypothyroidism Compliant with thyroid replacement.  No reports of tremor, heart palpitations or change in bowel habits.  Next appoint with endocrinology is on July 3  3 neck pain/shoulder pain Patient reports onset of left-sided trapezius pain that radiates into the shoulder on November 15.  She has a history of left shoulder repair.  She sees orthopedics on December 22.  Currently taking Tylenol but this does not seem to help much.  Using Biofreeze again helped some but not much.  And also using Zanaflex but again this is not helpful.    ROS: Per HPI  Allergies  Allergen Reactions  . Ketoconazole Hives and Swelling    SWELLING REACTION UNSPECIFIED   . Pravachol [Pravastatin Sodium]  Shortness Of Breath, Swelling and Anaphylaxis    Throat swelling  . Statins Other (See Comments)    Leg cramps  . Tape Dermatitis and Other (See Comments)    Steri-Strips  . Codeine Hypertension    increased BP   Past Medical History:  Diagnosis Date  . Allergic rhinitis   . Anemia     after gastric bypass in 2018  . Anxiety   . Barrett's esophagus   . Bipolar affective disorder ([place])   . CAP (community acquired pneumonia) 07/20/2018  . Chronic respiratory failure ([place])   . Constipation, chronic   . Degenerative joint disease of spine   . Depression   . Diabetes mellitus without complication ([place])    type 2   . DM (diabetes mellitus) ([place])    "hypoglycemic" per pt - no longer takes DM meds due to weight loss from gastric bypass in 2018  . Dry eye   . Family history of adverse reaction to anesthesia    nausea and vomiting  . Gastroparesis   . GERD (gastroesophageal reflux disease)   . History of bariatric surgery 03/2017  . History of hiatal hernia   . HLD (hyperlipidemia) 03/12/2013  . Hyperlipidemia   . Hypertension    No HTN meds since weight loss from Gastric Bypass in 2018  . Intractable chronic migraine without aura 06/04/2015  . Migraine headache   . Morbid obesity ([place])   . OSA (obstructive sleep apnea)    No cpap since gastric surgery  . Osteoarthritis    bilateral knee  . SBO (small bowel obstruction) ([place]) 03/19/2019  . Sleep apnea   . Unspecified hypothyroidism   .  Vitamin B 12 deficiency   . Vitamin D deficiency     Current Outpatient Medications:  .  baclofen (LIORESAL) 10 MG tablet, Take 1 tablet (10 mg total) by mouth 3 (three) times daily. As needed for muscle spasm, Disp: 30 tablet, Rfl: 0 .  Calcium-Vitamin D-Vitamin K 650-12.5-40 MG-MCG-MCG CHEW, Chew 1 tablet by mouth 2 (two) times daily., Disp: , Rfl:  .  dicyclomine (BENTYL) 20 MG tablet, TAKE 1 TABLET EVERY 8 HOURS AS NEEDED FOR ABDOMINAL CRAMPING (Patient taking differently: Take 20 mg by mouth  every 8 (eight) hours as needed for spasms. ), Disp: 90 tablet, Rfl: 0 .  divalproex (DEPAKOTE) 500 MG DR tablet, Take 500 mg by mouth in the morning and 1000 mg at bedtime, Disp: 270 tablet, Rfl: 3 .  fluticasone (FLONASE) 50 MCG/ACT nasal spray, USE 2 SPRAYS IN EACH NOSTRIL TWICE DAILY AS NEEDED FOR ALLERGY (Patient taking differently: Place 2 sprays into both nostrils daily as needed for allergies. ), Disp: 16 g, Rfl: 4 .  gabapentin (NEURONTIN) 300 MG capsule, Takes 2 capsules in morning, one cap at lunch, 2 caps at bedtime, Disp: 450 capsule, Rfl: 3 .  Galcanezumab-gnlm (EMGALITY) 120 MG/ML SOAJ, Inject 120 mg into the skin every 30 (thirty) days., Disp: 3 mL, Rfl: 3 .  Glucagon, rDNA, (GLUCAGON EMERGENCY IJ), Inject 1 Syringe as directed daily as needed (emergency low blood sugar). , Disp: , Rfl:  .  levothyroxine (SYNTHROID) 112 MCG tablet, TAKE 1 TABLET BEFORE BREAKFAST (Patient taking differently: Take 112 mcg by mouth daily before breakfast. ), Disp: 90 tablet, Rfl: 3 .  LINZESS 145 MCG CAPS capsule, Take 145 mcg by mouth daily before breakfast. , Disp: , Rfl:  .  loratadine (CLARITIN) 10 MG tablet, Take 10 mg by mouth daily as needed for allergies. , Disp: , Rfl:  .  Multiple Vitamins-Minerals (BARIATRIC MULTIVITAMINS/IRON PO), Take 1 tablet by mouth daily., Disp: , Rfl:  .  nystatin (NYSTATIN) powder, APPLY TO AFFECTED AREA OF GROIN RASH TWICE A DAY FOR 7 TO 10 DAYS PER FLARE (Patient taking differently: Apply 1 application topically daily as needed (irritation). ), Disp: 30 g, Rfl: 2 .  ondansetron (ZOFRAN) 4 MG tablet, Take 1 tablet (4 mg total) by mouth every 8 (eight) hours as needed for nausea or vomiting., Disp: 10 tablet, Rfl: 0 .  ONETOUCH ULTRA test strip, CHECK BLOOD SUGAR FOUR TIMES A DAY., Disp: 400 strip, Rfl: 3 .  oxyCODONE (ROXICODONE) 5 MG immediate release tablet, Take 1 tablet (5 mg total) by mouth every 4 (four) hours as needed for severe pain., Disp: 30 tablet, Rfl: 0 .   pantoprazole (PROTONIX) 40 MG tablet, Take 1 tablet (40 mg total) by mouth daily., Disp: 90 tablet, Rfl: 2 .  PAZEO 0.7 % SOLN, Place 1 drop into both eyes every morning., Disp: , Rfl:  .  polyethylene glycol (MIRALAX / GLYCOLAX) packet, Take 17 g by mouth at bedtime. , Disp: , Rfl:  .  Probiotic Product (PROBIOTIC DAILY PO), Take 1 capsule by mouth daily., Disp: , Rfl:  .  RESTASIS MULTIDOSE 0.05 % ophthalmic emulsion, Place 1 drop into both eyes 2 (two) times daily., Disp: , Rfl:  .  rizatriptan (MAXALT-MLT) 10 MG disintegrating tablet, Take 10 mg by mouth 2 (two) times daily as needed., Disp: , Rfl:  .  sennosides-docusate sodium (SENOKOT-S) 8.6-50 MG tablet, Take 2 tablets by mouth daily. For constipation when taking pain medication, Disp: 30 tablet, Rfl: 1 .  sertraline (ZOLOFT) 100 MG tablet, TAKE 2 TABLETS DAILY AS DIRECTED, Disp: 180 tablet, Rfl: 0 .  simethicone (MYLICON) 941 MG chewable tablet, Chew 125 mg by mouth in the morning, at noon, and at bedtime., Disp: , Rfl:  .  sucralfate (CARAFATE) 1 GM/10ML suspension, Take 1 g by mouth daily as needed (when taking celebrex). , Disp: , Rfl:  .  ziprasidone (GEODON) 60 MG capsule, TAKE 1 CAPSULE AT BEDTIME, Disp: 90 capsule, Rfl: 0 Social History   Socioeconomic History  . Marital status: Single    Spouse name: Not on file  . Number of children: 0  . Years of education: HS  . Highest education level: 12th grade  Occupational History  . Occupation: disabled    Fish farm manager: UNEMPLOYED  Tobacco Use  . Smoking status: Never Smoker  . Smokeless tobacco: Never Used  Vaping Use  . Vaping Use: Never used  Substance and Sexual Activity  . Alcohol use: No  . Drug use: No  . Sexual activity: Never    Birth control/protection: Post-menopausal    Comment: LMP 10/2017  Other Topics Concern  . Not on file  Social History Narrative   Patient is right handed.   Patient drinks 2 glasses of caffeine daily.   Lives at home. Her niece is living  with her right now.   Social Determinants of Health   Financial Resource Strain: Low Risk   . Difficulty of Paying Living Expenses: Not very hard  Food Insecurity: Not on file  Transportation Needs: No Transportation Needs  . Lack of Transportation (Medical): No  . Lack of Transportation (Non-Medical): No  Physical Activity: Inactive  . Days of Exercise per Week: 0 days  . Minutes of Exercise per Session: 0 min  Stress: Not on file  Social Connections: Moderately Integrated  . Frequency of Communication with Friends and Family: More than three times a week  . Frequency of Social Gatherings with Friends and Family: More than three times a week  . Attends Religious Services: More than 4 times per year  . Active Member of Clubs or Organizations: Yes  . Attends Archivist Meetings: More than 4 times per year  . Marital Status: Never married  Intimate Partner Violence: Not At Risk  . Fear of Current or Ex-Partner: No  . Emotionally Abused: No  . Physically Abused: No  . Sexually Abused: No   Family History  Problem Relation Age of Onset  . Asthma Father   . Allergies Father   . Heart disease Father        enlarged heart  . Peripheral vascular disease Father   . Diabetes Father   . Hyperlipidemia Father   . Arthritis Father   . Asthma Sister   . Cancer Sister        colon at 53 yr old.  . Colon cancer Sister   . Allergies Mother   . Stroke Mother 54       with hemi paralysis  . Diabetes Mother   . Hyperlipidemia Mother   . Hypertension Mother   . GI problems Mother   . Arthritis Mother   . Allergies Brother   . Early death Brother 9       congenital abormality  . Allergies Sister   . Diabetes Sister   . Asthma Sister   . Colon polyps Sister   . Hyperlipidemia Sister   . GI problems Sister        gastroporesis   .  Liver disease Sister        fatty liver  . Stroke Sister        intercrandial bleed  . Diabetes Brother   . Hypertension Brother   .  Hyperlipidemia Brother   . Heart disease Paternal Aunt   . Migraines Neg Hx     Objective: Office vital signs reviewed. BP 123/82   Pulse 67   Temp 97.6 F (36.4 C) (Temporal)   Ht 5\' 4"  (1.626 m)   Wt 191 lb (86.6 kg)   LMP 10/17/2017 (Approximate)   SpO2 96%   BMI 32.79 kg/m   Physical Examination:  General: Awake, alert, nontoxic, No acute distress HEENT: Normal, sclera white Cardio: regular rate and rhythm, S1S2 heard, no murmurs appreciated Pulm: clear to auscultation bilaterally, no wheezes, rhonchi or rales; normal work of breathing on room air Neck: Tenderness palpation along the trapezius on the left. MSK: Ambulating independently.  Assessment/ Plan: 54 y.o. female   Diet-controlled diabetes mellitus ([place]) - Plan: Bayer DCA Hb A1c Waived  Hypertension associated with diabetes ([place])  Hyperlipidemia associated with type 2 diabetes mellitus ([place])  Acquired hypothyroidism - Plan: Thyroid Panel With TSH  Overactive bladder - Plan: mirabegron ER (MYRBETRIQ) 25 MG TB24 tablet  Acute pain of left shoulder - Plan: methylPREDNISolone acetate (DEPO-MEDROL) injection 40 mg  Diabetes still is diet controlled.  No changes to medications. Blood pressure under control Thyroid asymptomatic.  Check thyroid panel Having some overactive bladder and therefore we will trial Myrbetriq Acute on chronic pain of the left shoulder was treated with Depo-Medrol.  Keep follow-up with orthopedics  Orders Placed This Encounter  Procedures  . Bayer DCA Hb A1c Waived  . Thyroid Panel With TSH   No orders of the defined types were placed in this encounter.    Janora Norlander, DO [place] [phone]

## 2020-11-24 NOTE — Patient Instructions (Signed)
Trial of Myrbetriq for overactive bladder You had labs performed today.  You will be contacted with the results of the labs once they are available, usually in the next 3 business days for routine lab work.  If you have an active my chart account, they will be released to your MyChart.  If you prefer to have these labs released to you via telephone, please let us know.  If you had a pap smear or biopsy performed, expect to be contacted in about 7-10 days.

## 2020-11-25 LAB — THYROID PANEL WITH TSH
Free Thyroxine Index: 1.2 (ref 1.2–4.9)
T3 Uptake Ratio: 24 % (ref 24–39)
T4, Total: 5.1 ug/dL (ref 4.5–12.0)
TSH: 2.46 u[IU]/mL (ref 0.450–4.500)

## 2020-12-02 DIAGNOSIS — M19012 Primary osteoarthritis, left shoulder: Secondary | ICD-10-CM | POA: Diagnosis not present

## 2020-12-15 DIAGNOSIS — Z86010 Personal history of colonic polyps: Secondary | ICD-10-CM | POA: Diagnosis not present

## 2020-12-15 DIAGNOSIS — D124 Benign neoplasm of descending colon: Secondary | ICD-10-CM | POA: Diagnosis not present

## 2020-12-15 DIAGNOSIS — K635 Polyp of colon: Secondary | ICD-10-CM | POA: Diagnosis not present

## 2020-12-16 DIAGNOSIS — M5412 Radiculopathy, cervical region: Secondary | ICD-10-CM | POA: Diagnosis not present

## 2020-12-24 DIAGNOSIS — E162 Hypoglycemia, unspecified: Secondary | ICD-10-CM | POA: Diagnosis not present

## 2020-12-24 DIAGNOSIS — Z9884 Bariatric surgery status: Secondary | ICD-10-CM | POA: Diagnosis not present

## 2020-12-24 DIAGNOSIS — R7302 Impaired glucose tolerance (oral): Secondary | ICD-10-CM | POA: Diagnosis not present

## 2020-12-24 DIAGNOSIS — E161 Other hypoglycemia: Secondary | ICD-10-CM | POA: Diagnosis not present

## 2020-12-31 ENCOUNTER — Other Ambulatory Visit: Payer: Self-pay | Admitting: Family Medicine

## 2020-12-31 DIAGNOSIS — F3170 Bipolar disorder, currently in remission, most recent episode unspecified: Secondary | ICD-10-CM

## 2021-01-13 DIAGNOSIS — M5412 Radiculopathy, cervical region: Secondary | ICD-10-CM | POA: Diagnosis not present

## 2021-01-16 ENCOUNTER — Other Ambulatory Visit: Payer: Self-pay | Admitting: Family Medicine

## 2021-01-30 ENCOUNTER — Other Ambulatory Visit: Payer: Self-pay | Admitting: Family Medicine

## 2021-01-30 DIAGNOSIS — K219 Gastro-esophageal reflux disease without esophagitis: Secondary | ICD-10-CM

## 2021-02-01 DIAGNOSIS — M5412 Radiculopathy, cervical region: Secondary | ICD-10-CM | POA: Diagnosis not present

## 2021-02-01 DIAGNOSIS — M542 Cervicalgia: Secondary | ICD-10-CM | POA: Diagnosis not present

## 2021-02-05 ENCOUNTER — Other Ambulatory Visit: Payer: Self-pay | Admitting: *Deleted

## 2021-02-05 MED ORDER — LINZESS 145 MCG PO CAPS
145.0000 ug | ORAL_CAPSULE | Freq: Every day | ORAL | 3 refills | Status: DC
Start: 2021-02-05 — End: 2022-01-24

## 2021-02-06 DIAGNOSIS — M542 Cervicalgia: Secondary | ICD-10-CM | POA: Diagnosis not present

## 2021-02-09 DIAGNOSIS — M791 Myalgia, unspecified site: Secondary | ICD-10-CM | POA: Diagnosis not present

## 2021-02-09 DIAGNOSIS — M5412 Radiculopathy, cervical region: Secondary | ICD-10-CM | POA: Diagnosis not present

## 2021-02-09 DIAGNOSIS — M542 Cervicalgia: Secondary | ICD-10-CM | POA: Diagnosis not present

## 2021-02-12 DIAGNOSIS — M5412 Radiculopathy, cervical region: Secondary | ICD-10-CM | POA: Diagnosis not present

## 2021-03-03 DIAGNOSIS — R7302 Impaired glucose tolerance (oral): Secondary | ICD-10-CM | POA: Diagnosis not present

## 2021-03-03 DIAGNOSIS — Z8639 Personal history of other endocrine, nutritional and metabolic disease: Secondary | ICD-10-CM | POA: Diagnosis not present

## 2021-03-03 DIAGNOSIS — Z9884 Bariatric surgery status: Secondary | ICD-10-CM | POA: Diagnosis not present

## 2021-03-03 DIAGNOSIS — E161 Other hypoglycemia: Secondary | ICD-10-CM | POA: Diagnosis not present

## 2021-03-04 DIAGNOSIS — M5412 Radiculopathy, cervical region: Secondary | ICD-10-CM | POA: Diagnosis not present

## 2021-03-05 DIAGNOSIS — M5412 Radiculopathy, cervical region: Secondary | ICD-10-CM | POA: Diagnosis not present

## 2021-03-10 ENCOUNTER — Ambulatory Visit (INDEPENDENT_AMBULATORY_CARE_PROVIDER_SITE_OTHER): Payer: Medicare Other

## 2021-03-10 DIAGNOSIS — Z0000 Encounter for general adult medical examination without abnormal findings: Secondary | ICD-10-CM | POA: Diagnosis not present

## 2021-03-10 NOTE — Progress Notes (Signed)
MEDICARE ANNUAL WELLNESS VISIT  03/10/2021  Telephone Visit Disclaimer This Medicare AWV was conducted by telephone due to national recommendations for restrictions regarding the COVID-19 Pandemic (e.g. social distancing).  I verified, using two identifiers, that I am speaking with Morgan [name] or their authorized healthcare agent. I discussed the limitations, risks, security, and privacy concerns of performing an evaluation and management service by telephone and the potential availability of an in-person appointment in the future. The patient expressed understanding and agreed to proceed.  Location of Patient: Home Location of Provider (nurse):  [facility]  Subjective:    Morgan [name] is a 55 y.o. female patient of Janora Norlander, DO who had a Medicare Annual Wellness Visit today via telephone. Morgan [name] is Disabled and lives alone. She has no children. She reports that she is socially active and does interact with friends/family regularly. She is minimally physically active and enjoys reading and crafting.  Patient Care Team: Janora Norlander, DO as PCP - General (Family Medicine) Skeet Latch, MD as PCP - Cardiology (Cardiology) Allyn Kenner, DO as Attending Physician (Obstetrics and Gynecology) Marchia Bond, MD as Attending Physician (Orthopedic Surgery) Mixon, Vinie Sill as Consulting Physician (Unknown Physician Specialty) Harlen Labs, MD as Referring Physician (Optometry) Deneise Lever, MD as Consulting Physician (Pulmonary Disease) Almedia Balls, MD as Consulting Physician (Orthopedic Surgery) Skeet Latch, MD as Attending Physician (Cardiology) Greer Pickerel, MD as Consulting Physician (General Surgery) Consuella Lose, MD as Consulting Physician (Neurosurgery)  Advanced Directives 03/10/2021 09/29/2020 08/11/2020 08/05/2020 08/04/2020 03/05/2020 02/21/2020  Does Patient Have a Medical Advance Directive? No Yes Yes Yes Yes Yes No  Type of Advance  Directive - - [place];Living will Living will Healthcare Power of [place] -  Does patient want to make changes to medical advance directive? - - No - Patient declined No - Patient declined - No - Patient declined -  Copy of [place] in Chart? - - Yes - validated most recent copy scanned in chart (See row information) No - copy requested - No - copy requested -  Would patient like information on creating a medical advance directive? No - Patient declined - - - - No - Patient declined No - Patient declined    Hospital Utilization Over the Past 12 Months: # of hospitalizations or ER visits: 1 # of surgeries: 1  Review of Systems    Patient reports that her overall health is unchanged compared to last year.  History obtained from chart review and the patient  Patient Reported Readings (BP, Pulse, CBG, Weight, etc) none  Pain Assessment Pain : 0-10 Pain Score: 6  Pain Type: Acute pain Pain Location: Neck Pain Orientation: Mid Pain Radiating Towards: right arm and hand Pain Descriptors / Indicators: Grimacing Pain Onset: More than a month ago Pain Frequency: Constant     Current Medications & Allergies (verified) Allergies as of 03/10/2021      Reactions   Ketoconazole Hives, Swelling   SWELLING REACTION UNSPECIFIED    Pravachol [pravastatin Sodium] Shortness Of Breath, Swelling, Anaphylaxis   Throat swelling   Statins Other (See Comments)   Leg cramps   Tape Dermatitis, Other (See Comments)   Steri-Strips   Codeine Hypertension   increased BP      Medication List       Accurate as of March 10, 2021 10:02 AM. If you have any questions, ask your nurse or doctor.  STOP taking these medications   ondansetron 4 MG tablet Commonly known as: Zofran     TAKE these medications   BARIATRIC MULTIVITAMINS/IRON PO Take 1 tablet by mouth daily.   Calcium-Vitamin D-Vitamin K 650-12.5-40 MG-MCG-MCG  Chew Chew 1 tablet by mouth 2 (two) times daily.   dicyclomine 20 MG tablet Commonly known as: BENTYL TAKE 1 TABLET EVERY 8 HOURS AS NEEDED FOR ABDOMINAL CRAMPING What changed: See the new instructions.   divalproex 500 MG DR tablet Commonly known as: DEPAKOTE Take 500 mg by mouth in the morning and 1000 mg at bedtime   Emgality 120 MG/ML Soaj Generic drug: Galcanezumab-gnlm Inject 120 mg into the skin every 30 (thirty) days.   fluticasone 50 MCG/ACT nasal spray Commonly known as: FLONASE USE 2 SPRAYS IN EACH NOSTRIL TWICE DAILY AS NEEDED FOR ALLERGY What changed: See the new instructions.   gabapentin 300 MG capsule Commonly known as: NEURONTIN Takes 2 capsules in morning, one cap at lunch, 2 caps at bedtime   GLUCAGON EMERGENCY IJ Inject 1 Syringe as directed daily as needed (emergency low blood sugar).   levothyroxine 112 MCG tablet Commonly known as: SYNTHROID TAKE 1 TABLET BEFORE BREAKFAST What changed: See the new instructions.   Linzess 145 MCG Caps capsule Generic drug: linaclotide Take 1 capsule (145 mcg total) by mouth daily before breakfast.   loratadine 10 MG tablet Commonly known as: CLARITIN Take 10 mg by mouth daily as needed for allergies.   metFORMIN 500 MG tablet Commonly known as: GLUCOPHAGE Take 500 mg by mouth daily with breakfast.   mirabegron ER 25 MG Tb24 tablet Commonly known as: Myrbetriq Take 1 tablet (25 mg total) by mouth daily.   nystatin powder Commonly known as: nystatin APPLY TO AFFECTED AREA OF GROIN RASH TWICE A DAY FOR 7 TO 10 DAYS PER FLARE   OneTouch Ultra test strip Generic drug: glucose blood CHECK BLOOD SUGAR FOUR TIMES A DAY.   pantoprazole 40 MG tablet Commonly known as: PROTONIX TAKE 1 TABLET DAILY   Pazeo 0.7 % Soln Generic drug: Olopatadine HCl Place 1 drop into both eyes every morning.   polyethylene glycol 17 g packet Commonly known as: MIRALAX / GLYCOLAX Take 17 g by mouth at bedtime.   PROBIOTIC  DAILY PO Take 1 capsule by mouth daily.   Restasis Multidose 0.05 % ophthalmic emulsion Generic drug: cycloSPORINE Place 1 drop into both eyes 2 (two) times daily.   rizatriptan 10 MG disintegrating tablet Commonly known as: MAXALT-MLT Take 10 mg by mouth 2 (two) times daily as needed.   sennosides-docusate sodium 8.6-50 MG tablet Commonly known as: SENOKOT-S Take 2 tablets by mouth daily. For constipation when taking pain medication   sertraline 100 MG tablet Commonly known as: ZOLOFT TAKE 2 TABLETS DAILY AS DIRECTED   simethicone 125 MG chewable tablet Commonly known as: MYLICON Chew 710 mg by mouth in the morning, at noon, and at bedtime.   ziprasidone 60 MG capsule Commonly known as: GEODON TAKE 1 CAPSULE AT BEDTIME       History (reviewed): Past Medical History:  Diagnosis Date  . Allergic rhinitis   . Anemia     after gastric bypass in 2018  . Anxiety   . Barrett's esophagus   . Bipolar affective disorder ([place])   . CAP (community acquired pneumonia) 07/20/2018  . Chronic respiratory failure ([place])   . Constipation, chronic   . Degenerative joint disease of spine   . Depression   . Diabetes mellitus without  complication ([place])    type 2   . DM (diabetes mellitus) ([place])    "hypoglycemic" per pt - no longer takes DM meds due to weight loss from gastric bypass in 2018  . Dry eye   . Family history of adverse reaction to anesthesia    nausea and vomiting  . Gastroparesis   . GERD (gastroesophageal reflux disease)   . History of bariatric surgery 03/2017  . History of hiatal hernia   . HLD (hyperlipidemia) 03/12/2013  . Hyperlipidemia   . Hypertension    No HTN meds since weight loss from Gastric Bypass in 2018  . Intractable chronic migraine without aura 06/04/2015  . Migraine headache   . Morbid obesity ([place])   . OSA (obstructive sleep apnea)    No cpap since gastric surgery  . Osteoarthritis    bilateral knee  . SBO (small bowel obstruction) ([place])  03/19/2019  . Sleep apnea   . Unspecified hypothyroidism   . Vitamin B 12 deficiency   . Vitamin D deficiency    Past Surgical History:  Procedure Laterality Date  . ANKLE ARTHROSCOPY WITH RECONSTRUCTION Right 09/24/2019   Procedure: ANKLE ARTHROSCOPY DEBRIDEMENT TREATMENT OF OSTEOCHONDRAL LESION TALUS. PRONEAL TENDON DEBRIDEMENT;  Surgeon: Erle Crocker, MD;  Location: [facility];  Service: Orthopedics;  Laterality: Right;  SURGERY REQUEST TIME 2 HOURS  . APPENDECTOMY  1978  . CHOLECYSTECTOMY  2005  . COLONOSCOPY    . disc repair  05/17/2019   with rods, lumbar spine  . GASTRIC ROUX-EN-Y N/A 03/21/2017   Procedure: LAPAROSCOPIC ROUX-EN-Y GASTRIC, UPPER ENDO;  Surgeon: Greer Pickerel, MD;  Location: [facility];  Service: General;  Laterality: N/A;  . GASTROSTOMY N/A 06/19/2018   Procedure: LAPRASCOPIC INSERTION OF GASTROSTOMY TUBE;  Surgeon: Greer Pickerel, MD;  Location: [facility];  Service: General;  Laterality: N/A;  . GASTROSTOMY TUBE PLACEMENT Left    06/2018  . HERNIA REPAIR    . HIATAL HERNIA REPAIR N/A 06/19/2018   Procedure: LAPAROSCOPIC REPAIR OF HIATAL HERNIA;  Surgeon: Greer Pickerel, MD;  Location: [facility];  Service: General;  Laterality: N/A;  . LAPAROSCOPIC LYSIS OF ADHESIONS  03/19/2019   Dr. Romana Juniper  . LAPAROSCOPY N/A 06/19/2018   Procedure: LAPAROSCOPY DIAGNOSTIC;  Surgeon: Greer Pickerel, MD;  Location: [facility];  Service: General;  Laterality: N/A;  . LAPAROSCOPY N/A 03/19/2019   Procedure: LAPAROSCOPY DIAGNOSTIC lysis of adhesions;  Surgeon: Clovis Riley, MD;  Location: [facility];  Service: General;  Laterality: N/A;  . LUMBAR LAMINECTOMY/DECOMPRESSION MICRODISCECTOMY Right 10/11/2018   Procedure: LAMINECTOMY AND FORAMINOTOMY RIGHT LUMBAR FOUR- LUMBAR FIVE;  Surgeon: Consuella Lose, MD;  Location: [facility];  Service: Neurosurgery;  Laterality: Right;  . SHOULDER ARTHROSCOPY  7/12   left-dsc  . TONSILLECTOMY  at age 69  . TOTAL SHOULDER ARTHROPLASTY Left  08/11/2020   Procedure: TOTAL SHOULDER ARTHROPLASTY;  Surgeon: Marchia Bond, MD;  Location: [facility];  Service: Orthopedics;  Laterality: Left;  . TRIGGER FINGER RELEASE  12/20/2012   Procedure: RELEASE TRIGGER FINGER/A-1 PULLEY;  Surgeon: Tennis Must, MD;  Location: [facility];  Service: Orthopedics;  Laterality: Left;  LEFT TRIGGER THUMB RELEASE   Family History  Problem Relation Age of Onset  . Asthma Father   . Allergies Father   . Heart disease Father        enlarged heart  . Peripheral vascular disease Father   . Diabetes Father   . Hyperlipidemia Father   .  Arthritis Father   . Asthma Sister   . Cancer Sister        colon at 59 yr old.  . Colon cancer Sister   . Allergies Mother   . Stroke Mother 49       with hemi paralysis  . Diabetes Mother   . Hyperlipidemia Mother   . Hypertension Mother   . GI problems Mother   . Arthritis Mother   . Allergies Brother   . Early death Brother 9       congenital abormality  . Allergies Sister   . Diabetes Sister   . Asthma Sister   . Colon polyps Sister   . Hyperlipidemia Sister   . GI problems Sister        gastroporesis   . Liver disease Sister        fatty liver  . Stroke Sister        intercrandial bleed  . Diabetes Brother   . Hypertension Brother   . Hyperlipidemia Brother   . Heart disease Paternal Aunt   . Migraines Neg Hx    Social History   Socioeconomic History  . Marital status: Single    Spouse name: Not on file  . Number of children: 0  . Years of education: HS  . Highest education level: 12th grade  Occupational History  . Occupation: disabled    Fish farm manager: UNEMPLOYED  Tobacco Use  . Smoking status: Never Smoker  . Smokeless tobacco: Never Used  Vaping Use  . Vaping Use: Never used  Substance and Sexual Activity  . Alcohol use: No  . Drug use: No  . Sexual activity: Never    Birth control/protection: Post-menopausal    Comment: LMP 10/2017  Other Topics  Concern  . Not on file  Social History Narrative   Patient is right handed.   Patient drinks 2 glasses of caffeine daily.   Lives at home. Her niece is living with her right now.   Social Determinants of Health   Financial Resource Strain: Not on file  Food Insecurity: Not on file  Transportation Needs: Not on file  Physical Activity: Not on file  Stress: Not on file  Social Connections: Not on file    Activities of Daily Living In your present state of health, do you have any difficulty performing the following activities: 03/10/2021 08/11/2020  Hearing? N N  Vision? N N  Difficulty concentrating or making decisions? N N  Walking or climbing stairs? N Y  Dressing or bathing? N N  Doing errands, shopping? N -  Preparing Food and eating ? N -  Using the Toilet? N -  In the past six months, have you accidently leaked urine? N -  Do you have problems with loss of bowel control? N -  Managing your Medications? N -  Managing your Finances? N -  Housekeeping or managing your Housekeeping? N -  Some recent data might be hidden    Patient Education/ Literacy How often do you need to have someone help you when you read instructions, pamphlets, or other written materials from your doctor or pharmacy?: 1 - Never What is the last grade level you completed in school?: 12th grade  Exercise Current Exercise Habits: The patient does not participate in regular exercise at present, Exercise limited by: None identified  Diet Patient reports consuming 4 meals a day and 2 snack(s) a day Patient reports that her primary diet is: Low fat, Low Sodium Patient reports that  she does have regular access to food.   Depression Screen PHQ 2/9 Scores 03/10/2021 11/24/2020 05/25/2020 03/05/2020 02/17/2020 11/19/2019 08/16/2019  PHQ - 2 Score 1 6 2 2 4 6 6   PHQ- 9 Score - 11 5 8 9 12  -     Fall Risk Fall Risk  03/10/2021 03/05/2020 09/11/2018 07/19/2018 05/10/2018  Falls in the past year? 0 0 No No No  Number  falls in past yr: - 0 - - -  Injury with Fall? - 0 - - -  Risk for fall due to : - Impaired mobility - - -  Follow up Falls evaluation completed Falls evaluation completed - - -     Objective:  Morgan [name] seemed alert and oriented and she participated appropriately during our telephone visit.  Blood Pressure Weight BMI  BP Readings from Last 3 Encounters:  11/24/20 123/82  08/12/20 105/73  08/04/20 132/85   Wt Readings from Last 3 Encounters:  11/24/20 191 lb (86.6 kg)  08/11/20 182 lb 1.6 oz (82.6 kg)  08/04/20 181 lb (82.1 kg)   BMI Readings from Last 1 Encounters:  11/24/20 32.79 kg/m    *Unable to obtain current vital signs, weight, and BMI due to telephone visit type  Hearing/Vision  . Alessandra did not seem to have difficulty with hearing/understanding during the telephone conversation . Reports that she has had a formal eye exam by an eye care professional within the past year . Reports that she has not had a formal hearing evaluation within the past year *Unable to fully assess hearing and vision during telephone visit type  Cognitive Function: 6CIT Screen 03/10/2021 03/05/2020  What Year? 0 points 0 points  What month? 0 points 0 points  What time? 0 points 0 points  Count back from 20 0 points 0 points  Months in reverse 0 points 0 points  Repeat phrase 0 points 0 points  Total Score 0 0   (Normal:0-7, Significant for Dysfunction: >8)  Normal Cognitive Function Screening: Yes   Immunization & Health Maintenance Record Immunization History  Administered Date(s) Administered  . Influenza Inj Mdck Quad With Preservative 09/11/2017  . Influenza Split 09/11/2020  . Influenza,inj,Quad PF,6+ Mos 09/29/2016, 09/07/2017, 09/11/2018  . Influenza,inj,quad, With Preservative 09/17/2019  . Influenza-Unspecified 09/11/2016  . Moderna Sars-Covid-2 Vaccination 03/02/2020, 03/30/2020, 10/14/2020  . Pneumococcal Polysaccharide-23 06/29/2016  . Td 07/09/2015  . Tdap  07/09/2015  . Zoster Recombinat (Shingrix) 10/06/2017, 01/04/2018    Health Maintenance  Topic Date Due  . MAMMOGRAM  05/26/2017  . PAP SMEAR-Modifier  05/28/2018  . FOOT EXAM  08/15/2020  . URINE MICROALBUMIN  08/15/2020  . HEMOGLOBIN A1C  05/25/2021  . OPHTHALMOLOGY EXAM  07/01/2021  . TETANUS/TDAP  07/08/2025  . COLONOSCOPY (Pts 45-39yrs Insurance coverage will need to be confirmed)  12/15/2025  . INFLUENZA VACCINE  Completed  . PNEUMOCOCCAL POLYSACCHARIDE VACCINE AGE 3-64 HIGH RISK  Completed  . COVID-19 Vaccine  Completed  . Hepatitis C Screening  Completed  . HIV Screening  Completed  . HPV VACCINES  Aged Out       Assessment  This is a routine wellness examination for WAVERLEY KREMPASKY.  Health Maintenance: Due or Overdue Health Maintenance Due  Topic Date Due  . MAMMOGRAM  05/26/2017  . PAP SMEAR-Modifier  05/28/2018  . FOOT EXAM  08/15/2020  . URINE MICROALBUMIN  08/15/2020    Morgan [name] does not need a referral for Community Assistance: Care Management:   no Social  Work:    no Prescription Assistance:  no Nutrition/Diabetes Education:  no   Plan:  Personalized Goals Goals Addressed            This Visit's Progress   . Patient Stated       03/10/2021 AWV Goal: Exercise for General Health   Patient will verbalize understanding of the benefits of increased physical activity:  Exercising regularly is important. It will improve your overall fitness, flexibility, and endurance.  Regular exercise also will improve your overall health. It can help you control your weight, reduce stress, and improve your bone density.  Over the next year, patient will increase physical activity as tolerated with a goal of at least 150 minutes of moderate physical activity per week.   You can tell that you are exercising at a moderate intensity if your heart starts beating faster and you start breathing faster but can still hold a conversation.  Moderate-intensity  exercise ideas include:  Walking 1 mile (1.6 km) in about 15 minutes  Biking  Hiking  Golfing  Dancing  Water aerobics  Patient will verbalize understanding of everyday activities that increase physical activity by providing examples like the following: ? Yard work, such as: ? Pushing a Conservation officer, nature ? Raking and bagging leaves ? Washing your car ? Pushing a stroller ? Shoveling snow ? Gardening ? Washing windows or floors  Patient will be able to explain general safety guidelines for exercising:   Before you start a new exercise program, talk with your health care provider.  Do not exercise so much that you hurt yourself, feel dizzy, or get very short of breath.  Wear comfortable clothes and wear shoes with good support.  Drink plenty of water while you exercise to prevent dehydration or heat stroke.  Work out until your breathing and your heartbeat get faster.       Personalized Health Maintenance & Screening Recommendations  up to date  Lung Cancer Screening Recommended: no (Low Dose CT Chest recommended if Age 22-80 years, 30 pack-year currently smoking OR have quit w/in past 15 years) Hepatitis C Screening recommended: no HIV Screening recommended: no  Advanced Directives: Written information was not prepared per patient's request.  Referrals & Orders No orders of the defined types were placed in this encounter.   Follow-up Plan . Follow-up with Janora Norlander, DO as planned    I have personally reviewed and noted the following in the patient's chart:   . Medical and social history . Use of alcohol, tobacco or illicit drugs  . Current medications and supplements . Functional ability and status . Nutritional status . Physical activity . Advanced directives . List of other physicians . Hospitalizations, surgeries, and ER visits in previous 12 months . Vitals . Screenings to include cognitive, depression, and falls . Referrals and  appointments  In addition, I have reviewed and discussed with Morgan [name] certain preventive protocols, quality metrics, and best practice recommendations. A written personalized care plan for preventive services as well as general preventive health recommendations is available and can be mailed to the patient at her request.      Felicity Coyer, LPN    [id number]   Patient declined after visit summary

## 2021-03-23 DIAGNOSIS — M542 Cervicalgia: Secondary | ICD-10-CM | POA: Diagnosis not present

## 2021-03-23 DIAGNOSIS — M791 Myalgia, unspecified site: Secondary | ICD-10-CM | POA: Diagnosis not present

## 2021-03-24 ENCOUNTER — Ambulatory Visit (INDEPENDENT_AMBULATORY_CARE_PROVIDER_SITE_OTHER): Payer: Medicare Other | Admitting: Family Medicine

## 2021-03-24 ENCOUNTER — Encounter: Payer: Self-pay | Admitting: Family Medicine

## 2021-03-24 ENCOUNTER — Other Ambulatory Visit: Payer: Self-pay

## 2021-03-24 VITALS — BP 103/67 | HR 63 | Temp 97.7°F | Ht 64.0 in | Wt 191.0 lb

## 2021-03-24 DIAGNOSIS — I152 Hypertension secondary to endocrine disorders: Secondary | ICD-10-CM

## 2021-03-24 DIAGNOSIS — E538 Deficiency of other specified B group vitamins: Secondary | ICD-10-CM | POA: Diagnosis not present

## 2021-03-24 DIAGNOSIS — E039 Hypothyroidism, unspecified: Secondary | ICD-10-CM

## 2021-03-24 DIAGNOSIS — Z5181 Encounter for therapeutic drug level monitoring: Secondary | ICD-10-CM

## 2021-03-24 DIAGNOSIS — Z9884 Bariatric surgery status: Secondary | ICD-10-CM | POA: Diagnosis not present

## 2021-03-24 DIAGNOSIS — E785 Hyperlipidemia, unspecified: Secondary | ICD-10-CM

## 2021-03-24 DIAGNOSIS — E119 Type 2 diabetes mellitus without complications: Secondary | ICD-10-CM | POA: Diagnosis not present

## 2021-03-24 DIAGNOSIS — E1169 Type 2 diabetes mellitus with other specified complication: Secondary | ICD-10-CM

## 2021-03-24 DIAGNOSIS — E559 Vitamin D deficiency, unspecified: Secondary | ICD-10-CM

## 2021-03-24 DIAGNOSIS — E1159 Type 2 diabetes mellitus with other circulatory complications: Secondary | ICD-10-CM

## 2021-03-24 DIAGNOSIS — R6889 Other general symptoms and signs: Secondary | ICD-10-CM | POA: Diagnosis not present

## 2021-03-24 LAB — BAYER DCA HB A1C WAIVED: HB A1C (BAYER DCA - WAIVED): 5.5 % (ref <7.0)

## 2021-03-24 NOTE — Patient Instructions (Signed)

## 2021-03-24 NOTE — Progress Notes (Signed)
Subjective: CC: Hypothyroidism, diet controlled diabetes PCP: Janora Norlander, DO QTM:[id number] R Branden is a 55 y.o. female presenting to clinic today for:  1.  Hypothyroidism Patient is compliant with Synthroid 112 mcg daily.  No reports of change in voice, difficulty swallowing or tremor  2.  Diet-controlled diabetes with history of hypoglycemic episodes Patient actually was started on Metformin 500 mg daily in efforts to stabilize blood sugar.  In fact she is done pretty well on this regimen and only had a handful of hypoglycemic episodes many of which did not occur after initiation of the Metformin.  Blood sugars have typically been ranging in the 80s to 90s with occasional 100s.  She is put on some weight and worries about this but overall seems to be doing well.  3.  History of Roux-en-Y Would like to have vitamin levels checked today and sent to her specialist at [facility]  4.  Migraine headaches Patient is closely monitored by [facility].  She is managed with Depakote, gabapentin and Ajovy.  She has a last several visits have been via video and she would like to have her Depakote level sent in to them with today's labs  ROS: Per HPI   Allergies  Allergen Reactions  . Ketoconazole Hives and Swelling    SWELLING REACTION UNSPECIFIED   . Pravachol [Pravastatin Sodium] Shortness Of Breath, Swelling and Anaphylaxis    Throat swelling  . Statins Other (See Comments)    Leg cramps  . Tape Dermatitis and Other (See Comments)    Steri-Strips  . Codeine Hypertension    increased BP   Past Medical History:  Diagnosis Date  . Allergic rhinitis   . Anemia     after gastric bypass in 2018  . Anxiety   . Barrett's esophagus   . Bipolar affective disorder ([place])   . CAP (community acquired pneumonia) 07/20/2018  . Chronic respiratory failure ([place])   . Constipation, chronic   . Degenerative joint disease of spine   . Depression   . Diabetes mellitus without  complication ([place])    type 2   . DM (diabetes mellitus) ([place])    "hypoglycemic" per pt - no longer takes DM meds due to weight loss from gastric bypass in 2018  . Dry eye   . Family history of adverse reaction to anesthesia    nausea and vomiting  . Gastroparesis   . GERD (gastroesophageal reflux disease)   . History of bariatric surgery 03/2017  . History of hiatal hernia   . HLD (hyperlipidemia) 03/12/2013  . Hyperlipidemia   . Hypertension    No HTN meds since weight loss from Gastric Bypass in 2018  . Intractable chronic migraine without aura 06/04/2015  . Migraine headache   . Morbid obesity ([place])   . OSA (obstructive sleep apnea)    No cpap since gastric surgery  . Osteoarthritis    bilateral knee  . SBO (small bowel obstruction) ([place]) 03/19/2019  . Sleep apnea   . Unspecified hypothyroidism   . Vitamin B 12 deficiency   . Vitamin D deficiency     Current Outpatient Medications:  .  Calcium-Vitamin D-Vitamin K 650-12.5-40 MG-MCG-MCG CHEW, Chew 1 tablet by mouth 2 (two) times daily., Disp: , Rfl:  .  dicyclomine (BENTYL) 20 MG tablet, TAKE 1 TABLET EVERY 8 HOURS AS NEEDED FOR ABDOMINAL CRAMPING (Patient taking differently: Take 20 mg by mouth every 8 (eight) hours as needed for spasms.), Disp: 90 tablet, Rfl: 0 .  divalproex (DEPAKOTE) 500 MG DR tablet, Take 500 mg by mouth in the morning and 1000 mg at bedtime, Disp: 270 tablet, Rfl: 3 .  fluticasone (FLONASE) 50 MCG/ACT nasal spray, USE 2 SPRAYS IN EACH NOSTRIL TWICE DAILY AS NEEDED FOR ALLERGY (Patient taking differently: Place 2 sprays into both nostrils daily as needed for allergies.), Disp: 16 g, Rfl: 4 .  gabapentin (NEURONTIN) 300 MG capsule, Takes 2 capsules in morning, one cap at lunch, 2 caps at bedtime, Disp: 450 capsule, Rfl: 3 .  Galcanezumab-gnlm (EMGALITY) 120 MG/ML SOAJ, Inject 120 mg into the skin every 30 (thirty) days., Disp: 3 mL, Rfl: 3 .  Glucagon, rDNA, (GLUCAGON EMERGENCY IJ), Inject 1 Syringe as directed  daily as needed (emergency low blood sugar). , Disp: , Rfl:  .  levothyroxine (SYNTHROID) 112 MCG tablet, TAKE 1 TABLET BEFORE BREAKFAST (Patient taking differently: Take 112 mcg by mouth daily before breakfast.), Disp: 90 tablet, Rfl: 3 .  LINZESS 145 MCG CAPS capsule, Take 1 capsule (145 mcg total) by mouth daily before breakfast., Disp: 90 capsule, Rfl: 3 .  loratadine (CLARITIN) 10 MG tablet, Take 10 mg by mouth daily as needed for allergies. , Disp: , Rfl:  .  metFORMIN (GLUCOPHAGE) 500 MG tablet, Take 500 mg by mouth daily with breakfast., Disp: , Rfl:  .  mirabegron ER (MYRBETRIQ) 25 MG TB24 tablet, Take 1 tablet (25 mg total) by mouth daily., Disp: 30 tablet, Rfl: 12 .  Multiple Vitamins-Minerals (BARIATRIC MULTIVITAMINS/IRON PO), Take 1 tablet by mouth daily., Disp: , Rfl:  .  nystatin (NYSTATIN) powder, APPLY TO AFFECTED AREA OF GROIN RASH TWICE A DAY FOR 7 TO 10 DAYS PER FLARE, Disp: 30 g, Rfl: 0 .  ONETOUCH ULTRA test strip, CHECK BLOOD SUGAR FOUR TIMES A DAY., Disp: 400 strip, Rfl: 3 .  pantoprazole (PROTONIX) 40 MG tablet, TAKE 1 TABLET DAILY, Disp: 90 tablet, Rfl: 0 .  PAZEO 0.7 % SOLN, Place 1 drop into both eyes every morning., Disp: , Rfl:  .  polyethylene glycol (MIRALAX / GLYCOLAX) packet, Take 17 g by mouth at bedtime., Disp: , Rfl:  .  Probiotic Product (PROBIOTIC DAILY PO), Take 1 capsule by mouth daily., Disp: , Rfl:  .  RESTASIS MULTIDOSE 0.05 % ophthalmic emulsion, Place 1 drop into both eyes 2 (two) times daily., Disp: , Rfl:  .  rizatriptan (MAXALT-MLT) 10 MG disintegrating tablet, Take 10 mg by mouth 2 (two) times daily as needed., Disp: , Rfl:  .  sennosides-docusate sodium (SENOKOT-S) 8.6-50 MG tablet, Take 2 tablets by mouth daily. For constipation when taking pain medication, Disp: 30 tablet, Rfl: 1 .  sertraline (ZOLOFT) 100 MG tablet, TAKE 2 TABLETS DAILY AS DIRECTED, Disp: 180 tablet, Rfl: 0 .  simethicone (MYLICON) 161 MG chewable tablet, Chew 125 mg by mouth in  the morning, at noon, and at bedtime., Disp: , Rfl:  .  ziprasidone (GEODON) 60 MG capsule, TAKE 1 CAPSULE AT BEDTIME, Disp: 90 capsule, Rfl: 0 Social History   Socioeconomic History  . Marital status: Single    Spouse name: Not on file  . Number of children: 0  . Years of education: HS  . Highest education level: 12th grade  Occupational History  . Occupation: disabled    Fish farm manager: UNEMPLOYED  Tobacco Use  . Smoking status: Never Smoker  . Smokeless tobacco: Never Used  Vaping Use  . Vaping Use: Never used  Substance and Sexual Activity  . Alcohol use: No  . Drug  use: No  . Sexual activity: Never    Birth control/protection: Post-menopausal    Comment: LMP 10/2017  Other Topics Concern  . Not on file  Social History Narrative   Patient is right handed.   Patient drinks 2 glasses of caffeine daily.   Lives at home. Her niece is living with her right now.   Social Determinants of Health   Financial Resource Strain: Not on file  Food Insecurity: Not on file  Transportation Needs: Not on file  Physical Activity: Not on file  Stress: Not on file  Social Connections: Not on file  Intimate Partner Violence: Not on file   Family History  Problem Relation Age of Onset  . Asthma Father   . Allergies Father   . Heart disease Father        enlarged heart  . Peripheral vascular disease Father   . Diabetes Father   . Hyperlipidemia Father   . Arthritis Father   . Asthma Sister   . Cancer Sister        colon at 68 yr old.  . Colon cancer Sister   . Allergies Mother   . Stroke Mother 56       with hemi paralysis  . Diabetes Mother   . Hyperlipidemia Mother   . Hypertension Mother   . GI problems Mother   . Arthritis Mother   . Allergies Brother   . Early death Brother 9       congenital abormality  . Allergies Sister   . Diabetes Sister   . Asthma Sister   . Colon polyps Sister   . Hyperlipidemia Sister   . GI problems Sister        gastroporesis   . Liver  disease Sister        fatty liver  . Stroke Sister        intercrandial bleed  . Diabetes Brother   . Hypertension Brother   . Hyperlipidemia Brother   . Heart disease Paternal Aunt   . Migraines Neg Hx     Objective: Office vital signs reviewed. BP 103/67   Pulse 63   Temp 97.7 F (36.5 C)   Ht '5\' 4"'  (1.626 m)   Wt 191 lb (86.6 kg)   LMP 10/17/2017 (Approximate)   SpO2 98%   BMI 32.79 kg/m   Physical Examination:  General: Awake, alert, better appearing than previous.  Her hair is becoming fuller and her color looks better, No acute distress HEENT: Normal; sclera white.  No goiter.  No exophthalmos Cardio: regular rate and rhythm, S1S2 heard, no murmurs appreciated Pulm: clear to auscultation bilaterally, no wheezes, rhonchi or rales; normal work of breathing on room air Extremities: warm, well perfused, No edema, cyanosis or clubbing; +2 pulses bilaterally MSK: Normal gait and station  Assessment/ Plan: 55 y.o. female   Acquired hypothyroidism - Plan: TSH, T4, Free  Diet-controlled diabetes mellitus ([place]) - Plan: Bayer DCA Hb A1c Waived, Microalbumin / creatinine urine ratio  Hypertension associated with diabetes ([place]) - Plan: CMP14+EGFR  Hyperlipidemia associated with type 2 diabetes mellitus ([place]) - Plan: CMP14+EGFR, Lipid Panel  History of Roux-en-Y gastric bypass - Plan: CMP14+EGFR, Vitamin B12, VITAMIN D 25 Hydroxy (Vit-D Deficiency, Fractures)  Vitamin D deficiency - Plan: VITAMIN D 25 Hydroxy (Vit-D Deficiency, Fractures)  Vitamin B 12 deficiency - Plan: Vitamin B12  Medication monitoring encounter - Plan: Valproic acid level  Asymptomatic from a thyroid standpoint Check A1c.  Blood sugars have been looking  much better on her log today so anticipate she will be closer to A1c of 6.5-7.  I think this is a good range for her given her struggles with hypoglycemia.  Continue Metformin as directed by her specialist  Check fasting lipid.  She has had  diet-controlled cholesterol since her Roux-en-Y bypass  Check for vitamin deficiency given history of Roux-en-Y  Depakote level was obtained and will be sent to her neurologist once available  Plan to send all the above labs to Dr. Oren Section at [facility].  Plan for EKG, diabetic foot exam at next visit.  No orders of the defined types were placed in this encounter.  No orders of the defined types were placed in this encounter.    Janora Norlander, DO [place] [phone]

## 2021-03-25 LAB — CMP14+EGFR
ALT: 17 IU/L (ref 0–32)
AST: 22 IU/L (ref 0–40)
Albumin/Globulin Ratio: 1.9 (ref 1.2–2.2)
Albumin: 4 g/dL (ref 3.8–4.9)
Alkaline Phosphatase: 62 IU/L (ref 44–121)
BUN/Creatinine Ratio: 43 — ABNORMAL HIGH (ref 9–23)
BUN: 25 mg/dL — ABNORMAL HIGH (ref 6–24)
Bilirubin Total: <0.2 mg/dL (ref 0.0–1.2)
CO2: 24 mmol/L (ref 20–29)
Calcium: 8.8 mg/dL (ref 8.7–10.2)
Chloride: 102 mmol/L (ref 96–106)
Creatinine, Ser: 0.58 mg/dL (ref 0.57–1.00)
Globulin, Total: 2.1 g/dL (ref 1.5–4.5)
Glucose: 90 mg/dL (ref 65–99)
Potassium: 4.4 mmol/L (ref 3.5–5.2)
Sodium: 142 mmol/L (ref 134–144)
Total Protein: 6.1 g/dL (ref 6.0–8.5)
eGFR: 108 mL/min/{1.73_m2} (ref >59)

## 2021-03-25 LAB — VITAMIN D 25 HYDROXY (VIT D DEFICIENCY, FRACTURES): Vit D, 25-Hydroxy: 60.5 ng/mL (ref 30.0–100.0)

## 2021-03-25 LAB — MICROALBUMIN / CREATININE URINE RATIO
Creatinine, Urine: 112.8 mg/dL
Microalb/Creat Ratio: 5 mg/g creat (ref 0–29)
Microalbumin, Urine: 5.4 ug/mL

## 2021-03-25 LAB — LIPID PANEL
Chol/HDL Ratio: 3.6 ratio (ref 0.0–4.4)
Cholesterol, Total: 225 mg/dL — ABNORMAL HIGH (ref 100–199)
HDL: 62 mg/dL (ref >39)
LDL Chol Calc (NIH): 145 mg/dL — ABNORMAL HIGH (ref 0–99)
Triglycerides: 103 mg/dL (ref 0–149)
VLDL Cholesterol Cal: 18 mg/dL (ref 5–40)

## 2021-03-25 LAB — VITAMIN B12: Vitamin B-12: 1331 pg/mL — ABNORMAL HIGH (ref 232–1245)

## 2021-03-25 LAB — T4, FREE: Free T4: 0.86 ng/dL (ref 0.82–1.77)

## 2021-03-25 LAB — TSH: TSH: 1.41 u[IU]/mL (ref 0.450–4.500)

## 2021-03-27 ENCOUNTER — Other Ambulatory Visit: Payer: Self-pay | Admitting: Family Medicine

## 2021-03-27 DIAGNOSIS — F3170 Bipolar disorder, currently in remission, most recent episode unspecified: Secondary | ICD-10-CM

## 2021-03-29 ENCOUNTER — Other Ambulatory Visit: Payer: Self-pay | Admitting: Family Medicine

## 2021-03-29 ENCOUNTER — Other Ambulatory Visit: Payer: Self-pay | Admitting: *Deleted

## 2021-03-29 DIAGNOSIS — E039 Hypothyroidism, unspecified: Secondary | ICD-10-CM

## 2021-03-29 MED ORDER — EZETIMIBE 10 MG PO TABS: 10.0000 mg | ORAL_TABLET | Freq: Every day | ORAL | 3 refills | Status: DC

## 2021-03-29 MED ORDER — LEVOTHYROXINE SODIUM 112 MCG PO TABS: 112.0000 ug | ORAL_TABLET | Freq: Every day | ORAL | 3 refills | Status: DC

## 2021-03-29 NOTE — Telephone Encounter (Signed)
Last office visit  Geodon last refill 01/01/21, #90 no refills Sertraline last refill 01/01/21, #180 no refills

## 2021-03-29 NOTE — Progress Notes (Signed)
Patient aware and she states that she will be by Friday

## 2021-04-02 ENCOUNTER — Ambulatory Visit: Payer: Medicare Other | Attending: Physical Medicine and Rehabilitation | Admitting: Physical Therapy

## 2021-04-02 ENCOUNTER — Other Ambulatory Visit: Payer: Medicare Other

## 2021-04-02 ENCOUNTER — Other Ambulatory Visit: Payer: Self-pay

## 2021-04-02 DIAGNOSIS — M546 Pain in thoracic spine: Secondary | ICD-10-CM | POA: Insufficient documentation

## 2021-04-02 DIAGNOSIS — M542 Cervicalgia: Secondary | ICD-10-CM | POA: Diagnosis not present

## 2021-04-02 DIAGNOSIS — R293 Abnormal posture: Secondary | ICD-10-CM | POA: Diagnosis not present

## 2021-04-02 DIAGNOSIS — Z5181 Encounter for therapeutic drug level monitoring: Secondary | ICD-10-CM | POA: Diagnosis not present

## 2021-04-02 NOTE — Therapy (Signed)
[facility] [place], [place], [address] Phone: [phone]   Fax:  [phone]  Physical Therapy Evaluation  Patient Details  Name: Morgan [name] MRN: [id number] Date of Birth: 19-Jul-1966 Referring Provider (PT): Laroy Apple MD   Encounter Date: 04/02/2021   PT End of Session - 04/02/21 0959    Visit Number 1    Number of Visits 12    Date for PT Re-Evaluation 05/14/21    Authorization Type FOTO.    PT Start Time 0730    PT Stop Time 0811    PT Time Calculation (min) 41 min    Activity Tolerance Patient tolerated treatment well    Behavior During Therapy [facility] for tasks assessed/performed           Past Medical History:  Diagnosis Date  . Allergic rhinitis   . Anemia     after gastric bypass in 2018  . Anxiety   . Barrett's esophagus   . Bipolar affective disorder ([place])   . CAP (community acquired pneumonia) 07/20/2018  . Chronic respiratory failure ([place])   . Constipation, chronic   . Degenerative joint disease of spine   . Depression   . Diabetes mellitus without complication ([place])    type 2   . DM (diabetes mellitus) ([place])    "hypoglycemic" per pt - no longer takes DM meds due to weight loss from gastric bypass in 2018  . Dry eye   . Family history of adverse reaction to anesthesia    nausea and vomiting  . Gastroparesis   . GERD (gastroesophageal reflux disease)   . H/O shoulder replacement 08/11/2020   left shoulder  . History of bariatric surgery 03/2017  . History of hiatal hernia   . HLD (hyperlipidemia) 03/12/2013  . Hyperlipidemia   . Hypertension    No HTN meds since weight loss from Gastric Bypass in 2018  . Intractable chronic migraine without aura 06/04/2015  . Migraine headache   . Morbid obesity ([place])   . OSA (obstructive sleep apnea)    No cpap since gastric surgery  . Osteoarthritis    bilateral knee  . SBO (small bowel obstruction) ([place]) 03/19/2019  . Sleep apnea   . Unspecified  hypothyroidism   . Vitamin B 12 deficiency   . Vitamin D deficiency     Past Surgical History:  Procedure Laterality Date  . ANKLE ARTHROSCOPY WITH RECONSTRUCTION Right 09/24/2019   Procedure: ANKLE ARTHROSCOPY DEBRIDEMENT TREATMENT OF OSTEOCHONDRAL LESION TALUS. PRONEAL TENDON DEBRIDEMENT;  Surgeon: Erle Crocker, MD;  Location: [place];  Service: Orthopedics;  Laterality: Right;  SURGERY REQUEST TIME 2 HOURS  . APPENDECTOMY  1978  . CHOLECYSTECTOMY  2005  . COLONOSCOPY    . disc repair  05/17/2019   with rods, lumbar spine  . GASTRIC ROUX-EN-Y N/A 03/21/2017   Procedure: LAPAROSCOPIC ROUX-EN-Y GASTRIC, UPPER ENDO;  Surgeon: Greer Pickerel, MD;  Location: [facility];  Service: General;  Laterality: N/A;  . GASTROSTOMY N/A 06/19/2018   Procedure: LAPRASCOPIC INSERTION OF GASTROSTOMY TUBE;  Surgeon: Greer Pickerel, MD;  Location: [facility];  Service: General;  Laterality: N/A;  . GASTROSTOMY TUBE PLACEMENT Left    06/2018  . HERNIA REPAIR    . HIATAL HERNIA REPAIR N/A 06/19/2018   Procedure: LAPAROSCOPIC REPAIR OF HIATAL HERNIA;  Surgeon: Greer Pickerel, MD;  Location: [facility];  Service: General;  Laterality: N/A;  . LAPAROSCOPIC LYSIS OF ADHESIONS  03/19/2019   Dr. Romana Juniper  .  LAPAROSCOPY N/A 06/19/2018   Procedure: LAPAROSCOPY DIAGNOSTIC;  Surgeon: Greer Pickerel, MD;  Location: [facility];  Service: General;  Laterality: N/A;  . LAPAROSCOPY N/A 03/19/2019   Procedure: LAPAROSCOPY DIAGNOSTIC lysis of adhesions;  Surgeon: Clovis Riley, MD;  Location: [facility];  Service: General;  Laterality: N/A;  . LUMBAR LAMINECTOMY/DECOMPRESSION MICRODISCECTOMY Right 10/11/2018   Procedure: LAMINECTOMY AND FORAMINOTOMY RIGHT LUMBAR FOUR- LUMBAR FIVE;  Surgeon: Consuella Lose, MD;  Location: [place];  Service: Neurosurgery;  Laterality: Right;  . SHOULDER ARTHROSCOPY  7/12   left-dsc  . TONSILLECTOMY  at age 55  . TOTAL SHOULDER ARTHROPLASTY Left 08/11/2020   Procedure: TOTAL SHOULDER  ARTHROPLASTY;  Surgeon: Marchia Bond, MD;  Location: [place];  Service: Orthopedics;  Laterality: Left;  . TRIGGER FINGER RELEASE  12/20/2012   Procedure: RELEASE TRIGGER FINGER/A-1 PULLEY;  Surgeon: Tennis Must, MD;  Location: [place];  Service: Orthopedics;  Laterality: Left;  LEFT TRIGGER THUMB RELEASE    There were no vitals filed for this visit.    Subjective Assessment - 04/02/21 1002    Subjective COVID-19 screen performed prior to patient entering clinic. The patient presents to the clinic today with c/o lower neck pain.  She states it began in November of last year while doing exercise.  Her pain today is rated at an 8/10 today.  She states nothing decreases her pain and everything increases her pain.    Pertinent History DM, Bi-polar, DJD, DM, ankle surgery, OA., GERD, hypothyroidism, left TSA, lumbar surgery, trigger finger release.    Patient Stated Goals Get rid of pain.    Currently in Pain? Yes    Pain Score 8     Pain Location Thoracic   C-T junction.   Pain Descriptors / Indicators Aching;Sore;Numbness;Sharp;Throbbing    Pain Type Acute pain    Pain Onset More than a month ago    Aggravating Factors  See above.    Pain Relieving Factors See above.              [facility] PT Assessment - 04/02/21 0001      Assessment   Medical Diagnosis Cerv/Thor junction pain.    Referring Provider (PT) Laroy Apple MD    Onset Date/Surgical Date --   November 2021.     Precautions   Precautions None      Balance Screen   Has the patient fallen in the past 6 months No    Has the patient had a decrease in activity level because of a fear of falling?  No    Is the patient reluctant to leave their home because of a fear of falling?  No      Home Environment   Living Environment Private residence      Prior Function   Level of Independence Independent      Posture/Postural Control   Posture/Postural Control Postural limitations     Postural Limitations Rounded Shoulders;Forward head    Posture Comments Increased cervical lordosis.      ROM / Strength   AROM / PROM / Strength AROM;Strength      AROM   Overall AROM Comments Full active bilateral shoulder range of motion.  Bilateral active cervical rotation is 60 degrees.      Strength   Overall Strength Comments Left shoulder strength is good (left TSA), right UE strength is normal.  Normal scapular strength.      Palpation   Palpation comment Tender to palpation C6  to T3 and bilaterally adjacent to this region.      Special Tests   Other special tests Normal UE DTR's.                      Objective measurements completed on examination: See above findings.       [facility] Adult PT Treatment/Exercise - 04/02/21 0001      Modalities   Modalities Moist Heat;Electrical Stimulation      Moist Heat Therapy   Number Minutes Moist Heat 15 Minutes    Moist Heat Location --   Lower Cervical/upper thoracic.     Acupuncturist Location C6 to T3 bilaterally.    Electrical Stimulation Action IFC at 80-150 Hz.    Electrical Stimulation Parameters 40% scan x 15 minutes.    Electrical Stimulation Goals Pain;Tone                  PT Education - 04/02/21 1030    Education Details Chin tucks and cervical instruction.               PT Long Term Goals - 04/02/21 1037      PT LONG TERM GOAL #1   Title Independent with a HEP and progression.    Time 6    Period Weeks    Status New      PT LONG TERM GOAL #2   Title Increase active cervical rotation to 70 degrees+ so patient can turn head more easily while driving.    Time 6    Period Weeks    Status New      PT LONG TERM GOAL #3   Title Perform ADL's with pain not > 2-3/10.    Time 6    Period Weeks    Status New                  Plan - 04/02/21 1027    Clinical Impression Statement The patient presents to O[facility] with c/o pain at the  cervical-thoracic junction.  She states the pain began in November when doing exercises.  Her bilateral active cervical rotation is limited.  Her UE DTR's are normal.  Her pain is located from C6 to T3 adjacently to this region bilaterally.  Patient will benefit from skilled physical therapy intervention to address pain and deficits.    Personal Factors and Comorbidities Comorbidity 1;Comorbidity 3+;Comorbidity 2    Comorbidities DM, Bi-polar, DJD, DM, ankle surgery.    Examination-Activity Limitations Other    Examination-Participation Restrictions Other    Stability/Clinical Decision Making Stable/Uncomplicated    Clinical Decision Making Low    Rehab Potential Excellent    PT Frequency 2x / week    PT Duration 6 weeks    PT Treatment/Interventions ADLs/Self Care Home Management;Cryotherapy;Electrical Stimulation;Ultrasound;Traction;Moist Heat;Therapeutic activities;Therapeutic exercise;Manual techniques;Patient/family education;Joint Manipulations;Spinal Manipulations;Passive range of motion    PT Next Visit Plan PA and costo-vertebral mobs, chin tucks and extension, combo e'stim/US, HMP/E'stim.    Consulted and Agree with Plan of Care Patient           Patient will benefit from skilled therapeutic intervention in order to improve the following deficits and impairments:  Pain,Decreased activity tolerance,Decreased range of motion,Increased edema  Visit Diagnosis: Cervicalgia - Plan: PT plan of care cert/re-cert  Pain in thoracic spine - Plan: PT plan of care cert/re-cert  Abnormal posture - Plan: PT plan of care cert/re-cert     Problem List Patient Active Problem  List   Diagnosis Date Noted  . Osteoarthritis of left shoulder 08/11/2020  . S/P shoulder replacement, left 08/11/2020  . AMS (altered mental status) 05/19/2019  . Thrombocytopenia ([place]) 05/19/2019  . SBO (small bowel obstruction) s/p lap LOA 03/19/2019 03/19/2019  . Chronic migraine without aura, with intractable  migraine, so stated, with status migrainosus 11/20/2018  . Lumbar radiculopathy 10/11/2018  . Atelectasis   . Hypoglycemia   . Hypotension   . Bradycardia   . Epigastric pain 06/19/2018  . History of adenomatous polyp of colon 10/23/2017  . Hyperlipidemia associated with type 2 diabetes mellitus ([place]) 07/06/2017  . GERD (gastroesophageal reflux disease) 03/21/2017  . History of Roux-en-Y gastric bypass 2018 03/21/2017  . Morbid obesity with BMI of 40.0-44.9, adult ([place]) 06/06/2016  . Intractable chronic migraine without aura 06/04/2015  . Abnormal uterine bleeding 05/13/2015  . Pancreatitis 02/27/2014  . Fatty liver disease, nonalcoholic 35/70/1779  . Bipolar disorder ([place]) 02/20/2014  . Metabolic syndrome 39/02/91  . Vitamin D deficiency   . DM (diabetes mellitus) ([place]) 03/12/2013  . Surgery, elective 06/12/2011  . Hypothyroidism   . Constipation, chronic   . Vitamin B 12 deficiency   . ALLERGIC RHINITIS 12/31/2010  . BARRETTS ESOPHAGUS 12/31/2010  . Gastroparesis 12/31/2010  . Bilateral chronic knee pain 12/31/2010  . Obstructive sleep apnea 09/27/2010  . Migraine 09/27/2010  . RESPIRATORY FAILURE, CHRONIC 09/27/2010  . Cyst of ovary 10/19/2009    Sanay Belmar, [place] MPT 04/02/2021, 10:42 AM  [facility] [address] [place], [place], [address] Phone: [phone]   Fax:  [phone]  Name: ISABELLAH SOBOCINSKI MRN: [id number] Date of Birth: [birth year]

## 2021-04-03 LAB — VALPROIC ACID LEVEL: Valproic Acid Lvl: 25 ug/mL — ABNORMAL LOW (ref 50–100)

## 2021-04-06 ENCOUNTER — Other Ambulatory Visit: Payer: Self-pay

## 2021-04-06 ENCOUNTER — Encounter: Payer: Self-pay | Admitting: Physical Therapy

## 2021-04-06 ENCOUNTER — Ambulatory Visit: Payer: Medicare Other | Admitting: Physical Therapy

## 2021-04-06 DIAGNOSIS — R293 Abnormal posture: Secondary | ICD-10-CM

## 2021-04-06 DIAGNOSIS — M546 Pain in thoracic spine: Secondary | ICD-10-CM

## 2021-04-06 DIAGNOSIS — M542 Cervicalgia: Secondary | ICD-10-CM | POA: Diagnosis not present

## 2021-04-06 NOTE — Therapy (Signed)
[facility] [place], [place], [address] Phone: [phone]   Fax:  [phone]  Physical Therapy Treatment  Patient Details  Name: Morgan [name] MRN: [id number] Date of Birth: 24-Sep-1966 Referring Provider (PT): Laroy Apple MD   Encounter Date: 04/06/2021   PT End of Session - 04/06/21 0733    Visit Number 2    Number of Visits 12    Date for PT Re-Evaluation 05/14/21    Authorization Type FOTO.    PT Start Time [phone]    PT Stop Time 0816    PT Time Calculation (min) 43 min    Activity Tolerance Patient tolerated treatment well    Behavior During Therapy [facility] for tasks assessed/performed           Past Medical History:  Diagnosis Date  . Allergic rhinitis   . Anemia     after gastric bypass in 2018  . Anxiety   . Barrett's esophagus   . Bipolar affective disorder ([place])   . CAP (community acquired pneumonia) 07/20/2018  . Chronic respiratory failure ([place])   . Constipation, chronic   . Degenerative joint disease of spine   . Depression   . Diabetes mellitus without complication ([place])    type 2   . DM (diabetes mellitus) ([place])    "hypoglycemic" per pt - no longer takes DM meds due to weight loss from gastric bypass in 2018  . Dry eye   . Family history of adverse reaction to anesthesia    nausea and vomiting  . Gastroparesis   . GERD (gastroesophageal reflux disease)   . H/O shoulder replacement 08/11/2020   left shoulder  . History of bariatric surgery 03/2017  . History of hiatal hernia   . HLD (hyperlipidemia) 03/12/2013  . Hyperlipidemia   . Hypertension    No HTN meds since weight loss from Gastric Bypass in 2018  . Intractable chronic migraine without aura 06/04/2015  . Migraine headache   . Morbid obesity ([place])   . OSA (obstructive sleep apnea)    No cpap since gastric surgery  . Osteoarthritis    bilateral knee  . SBO (small bowel obstruction) ([place]) 03/19/2019  . Sleep apnea   . Unspecified  hypothyroidism   . Vitamin B 12 deficiency   . Vitamin D deficiency     Past Surgical History:  Procedure Laterality Date  . ANKLE ARTHROSCOPY WITH RECONSTRUCTION Right 09/24/2019   Procedure: ANKLE ARTHROSCOPY DEBRIDEMENT TREATMENT OF OSTEOCHONDRAL LESION TALUS. PRONEAL TENDON DEBRIDEMENT;  Surgeon: Erle Crocker, MD;  Location: [place];  Service: Orthopedics;  Laterality: Right;  SURGERY REQUEST TIME 2 HOURS  . APPENDECTOMY  1978  . CHOLECYSTECTOMY  2005  . COLONOSCOPY    . disc repair  05/17/2019   with rods, lumbar spine  . GASTRIC ROUX-EN-Y N/A 03/21/2017   Procedure: LAPAROSCOPIC ROUX-EN-Y GASTRIC, UPPER ENDO;  Surgeon: Greer Pickerel, MD;  [facility];  Service: General;  Laterality: N/A;  . GASTROSTOMY N/A 06/19/2018   Procedure: LAPRASCOPIC INSERTION OF GASTROSTOMY TUBE;  Surgeon: Greer Pickerel, MD;  [facility];  Service: General;  Laterality: N/A;  . GASTROSTOMY TUBE PLACEMENT Left    06/2018  . HERNIA REPAIR    . HIATAL HERNIA REPAIR N/A 06/19/2018   Procedure: LAPAROSCOPIC REPAIR OF HIATAL HERNIA;  Surgeon: Greer Pickerel, MD;  [facility];  Service: General;  Laterality: N/A;  . LAPAROSCOPIC LYSIS OF ADHESIONS  03/19/2019   Dr. Romana Juniper  .  LAPAROSCOPY N/A 06/19/2018   Procedure: LAPAROSCOPY DIAGNOSTIC;  Surgeon: Greer Pickerel, MD;  [facility];  Service: General;  Laterality: N/A;  . LAPAROSCOPY N/A 03/19/2019   Procedure: LAPAROSCOPY DIAGNOSTIC lysis of adhesions;  Surgeon: Clovis Riley, MD;  [facility];  Service: General;  Laterality: N/A;  . LUMBAR LAMINECTOMY/DECOMPRESSION MICRODISCECTOMY Right 10/11/2018   Procedure: LAMINECTOMY AND FORAMINOTOMY RIGHT LUMBAR FOUR- LUMBAR FIVE;  Surgeon: Consuella Lose, MD;  Location: [place];  Service: Neurosurgery;  Laterality: Right;  . SHOULDER ARTHROSCOPY  7/12   left-dsc  . TONSILLECTOMY  at age 74  . TOTAL SHOULDER ARTHROPLASTY Left 08/11/2020   Procedure: TOTAL SHOULDER  ARTHROPLASTY;  Surgeon: Marchia Bond, MD;  Location: [place];  Service: Orthopedics;  Laterality: Left;  . TRIGGER FINGER RELEASE  12/20/2012   Procedure: RELEASE TRIGGER FINGER/A-1 PULLEY;  Surgeon: Tennis Must, MD;  Location: [place];  Service: Orthopedics;  Laterality: Left;  LEFT TRIGGER THUMB RELEASE    There were no vitals filed for this visit.   Subjective Assessment - 04/06/21 0731    Subjective COVID-19 screen performed prior to patient entering clinic. Reports she has constant pain across C7 area but no relief even after trying meds, ice, heat. Had two steroid shots with no relief either.    Pertinent History DM, Bi-polar, DJD, DM, ankle surgery, OA., GERD, hypothyroidism, left TSA, lumbar surgery, trigger finger release.    Patient Stated Goals Get rid of pain.    Currently in Pain? Yes    Pain Score 8     Pain Location Thoracic    Pain Orientation Upper    Pain Descriptors / Indicators Aching;Sharp    Pain Type Acute pain    Pain Radiating Towards numb tingling- RUE    Pain Onset More than a month ago    Pain Frequency Constant              [facility] PT Assessment - 04/06/21 0001      Assessment   Medical Diagnosis Cerv/Thor junction pain.    Referring Provider (PT) Laroy Apple MD    Hand Dominance Right      Precautions   Precautions None                         [facility] Adult PT Treatment/Exercise - 04/06/21 0001      Modalities   Modalities Electrical Stimulation;Moist Heat;Ultrasound      Moist Heat Therapy   Number Minutes Moist Heat 15 Minutes    Moist Heat Location Cervical      Electrical Stimulation   Electrical Stimulation Location B UT alongside C6-T2 region    Electrical Stimulation Action IFC    Electrical Stimulation Parameters 80-150 hz x15 min    Electrical Stimulation Goals Pain;Tone      Ultrasound   Ultrasound Location B UT    Ultrasound Parameters Combo 1.5 w/cm2, 100%, 1 mhz x37min     Ultrasound Goals Pain      Manual Therapy   Manual Therapy Soft tissue mobilization    Soft tissue mobilization STW to B UT, levator scapula, cervical paraspinals to reduce pain and tone                       PT Long Term Goals - 04/02/21 1037      PT LONG TERM GOAL #1   Title Independent with a HEP and progression.  Time 6    Period Weeks    Status New      PT LONG TERM GOAL #2   Title Increase active cervical rotation to 70 degrees+ so patient can turn head more easily while driving.    Time 6    Period Weeks    Status New      PT LONG TERM GOAL #3   Title Perform ADL's with pain not > 2-3/10.    Time 6    Period Weeks    Status New                 Plan - 04/06/21 3154    Clinical Impression Statement Patient presented in clinic with reports of constant cervical pain with intermittant numbness and tingling along RUE. Patient reports constant pain along C7 and into UT regions with no good source of relief found yet. Patient presented with min to mod tone of B UT surrounding C7. Undetermined if edema present surrounding C7 or just tissue prominance. No reports of increased tenderness or pain during manual therapy. Normal modalities response noted following removal of the modalities.    Personal Factors and Comorbidities Comorbidity 1;Comorbidity 3+;Comorbidity 2    Comorbidities DM, Bi-polar, DJD, DM, ankle surgery.    Examination-Activity Limitations Other    Examination-Participation Restrictions Other    Stability/Clinical Decision Making Stable/Uncomplicated    Rehab Potential Excellent    PT Frequency 2x / week    PT Duration 6 weeks    PT Treatment/Interventions ADLs/Self Care Home Management;Cryotherapy;Electrical Stimulation;Ultrasound;Traction;Moist Heat;Therapeutic activities;Therapeutic exercise;Manual techniques;Patient/family education;Joint Manipulations;Spinal Manipulations;Passive range of motion    PT Next Visit Plan PA and  costo-vertebral mobs, chin tucks and extension, combo e'stim/US, HMP/E'stim.    Consulted and Agree with Plan of Care Patient           Patient will benefit from skilled therapeutic intervention in order to improve the following deficits and impairments:  Pain,Decreased activity tolerance,Decreased range of motion,Increased edema  Visit Diagnosis: Cervicalgia  Pain in thoracic spine  Abnormal posture     Problem List Patient Active Problem List   Diagnosis Date Noted  . Osteoarthritis of left shoulder 08/11/2020  . S/P shoulder replacement, left 08/11/2020  . AMS (altered mental status) 05/19/2019  . Thrombocytopenia ([place]) 05/19/2019  . SBO (small bowel obstruction) s/p lap LOA 03/19/2019 03/19/2019  . Chronic migraine without aura, with intractable migraine, so stated, with status migrainosus 11/20/2018  . Lumbar radiculopathy 10/11/2018  . Atelectasis   . Hypoglycemia   . Hypotension   . Bradycardia   . Epigastric pain 06/19/2018  . History of adenomatous polyp of colon 10/23/2017  . Hyperlipidemia associated with type 2 diabetes mellitus ([place]) 07/06/2017  . GERD (gastroesophageal reflux disease) 03/21/2017  . History of Roux-en-Y gastric bypass 2018 03/21/2017  . Morbid obesity with BMI of 40.0-44.9, adult ([place]) 06/06/2016  . Intractable chronic migraine without aura 06/04/2015  . Abnormal uterine bleeding 05/13/2015  . Pancreatitis 02/27/2014  . Fatty liver disease, nonalcoholic 00/86/7619  . Bipolar disorder ([place]) 02/20/2014  . Metabolic syndrome 50/93/2671  . Vitamin D deficiency   . DM (diabetes mellitus) ([place]) 03/12/2013  . Surgery, elective 06/12/2011  . Hypothyroidism   . Constipation, chronic   . Vitamin B 12 deficiency   . ALLERGIC RHINITIS 12/31/2010  . BARRETTS ESOPHAGUS 12/31/2010  . Gastroparesis 12/31/2010  . Bilateral chronic knee pain 12/31/2010  . Obstructive sleep apnea 09/27/2010  . Migraine 09/27/2010  . RESPIRATORY FAILURE, CHRONIC  09/27/2010  .  Cyst of ovary 10/19/2009    Standley Brooking, PTA 04/06/2021, 8:42 AM  [facility] [address] [place], [place], [address] Phone: [phone]   Fax:  [phone]  Name: Morgan [name] MRN: [id number] Date of Birth: 1965-12-23

## 2021-04-07 ENCOUNTER — Ambulatory Visit: Payer: Medicare Other | Admitting: Physical Therapy

## 2021-04-07 ENCOUNTER — Other Ambulatory Visit: Payer: Self-pay

## 2021-04-07 DIAGNOSIS — M546 Pain in thoracic spine: Secondary | ICD-10-CM | POA: Diagnosis not present

## 2021-04-07 DIAGNOSIS — R293 Abnormal posture: Secondary | ICD-10-CM

## 2021-04-07 DIAGNOSIS — M542 Cervicalgia: Secondary | ICD-10-CM

## 2021-04-07 NOTE — Therapy (Signed)
[facility] [place], [place], [address] Phone: [phone]   Fax:  [phone]  Physical Therapy Treatment  Patient Details  Name: Morgan [name] MRN: [id number] Date of Birth: 1966/10/18 Referring Provider (PT): Laroy Apple MD   Encounter Date: 04/07/2021   PT End of Session - 04/07/21 0844    Visit Number 3    Number of Visits 12    Date for PT Re-Evaluation 05/14/21    Authorization Type FOTO.    PT Start Time 0730    PT Stop Time 0824    PT Time Calculation (min) 54 min    Activity Tolerance Patient tolerated treatment well    Behavior During Therapy [facility] for tasks assessed/performed           Past Medical History:  Diagnosis Date  . Allergic rhinitis   . Anemia     after gastric bypass in 2018  . Anxiety   . Barrett's esophagus   . Bipolar affective disorder ([place])   . CAP (community acquired pneumonia) 07/20/2018  . Chronic respiratory failure ([place])   . Constipation, chronic   . Degenerative joint disease of spine   . Depression   . Diabetes mellitus without complication ([place])    type 2   . DM (diabetes mellitus) ([place])    "hypoglycemic" per pt - no longer takes DM meds due to weight loss from gastric bypass in 2018  . Dry eye   . Family history of adverse reaction to anesthesia    nausea and vomiting  . Gastroparesis   . GERD (gastroesophageal reflux disease)   . H/O shoulder replacement 08/11/2020   left shoulder  . History of bariatric surgery 03/2017  . History of hiatal hernia   . HLD (hyperlipidemia) 03/12/2013  . Hyperlipidemia   . Hypertension    No HTN meds since weight loss from Gastric Bypass in 2018  . Intractable chronic migraine without aura 06/04/2015  . Migraine headache   . Morbid obesity ([place])   . OSA (obstructive sleep apnea)    No cpap since gastric surgery  . Osteoarthritis    bilateral knee  . SBO (small bowel obstruction) ([place]) 03/19/2019  . Sleep apnea   . Unspecified  hypothyroidism   . Vitamin B 12 deficiency   . Vitamin D deficiency     Past Surgical History:  Procedure Laterality Date  . ANKLE ARTHROSCOPY WITH RECONSTRUCTION Right 09/24/2019   Procedure: ANKLE ARTHROSCOPY DEBRIDEMENT TREATMENT OF OSTEOCHONDRAL LESION TALUS. PRONEAL TENDON DEBRIDEMENT;  Surgeon: Erle Crocker, MD;  Location: [place];  Service: Orthopedics;  Laterality: Right;  SURGERY REQUEST TIME 2 HOURS  . APPENDECTOMY  1978  . CHOLECYSTECTOMY  2005  . COLONOSCOPY    . disc repair  05/17/2019   with rods, lumbar spine  . GASTRIC ROUX-EN-Y N/A 03/21/2017   Procedure: LAPAROSCOPIC ROUX-EN-Y GASTRIC, UPPER ENDO;  Surgeon: Greer Pickerel, MD;  [facility];  Service: General;  Laterality: N/A;  . GASTROSTOMY N/A 06/19/2018   Procedure: LAPRASCOPIC INSERTION OF GASTROSTOMY TUBE;  Surgeon: Greer Pickerel, MD;  [facility];  Service: General;  Laterality: N/A;  . GASTROSTOMY TUBE PLACEMENT Left    06/2018  . HERNIA REPAIR    . HIATAL HERNIA REPAIR N/A 06/19/2018   Procedure: LAPAROSCOPIC REPAIR OF HIATAL HERNIA;  Surgeon: Greer Pickerel, MD;  [facility];  Service: General;  Laterality: N/A;  . LAPAROSCOPIC LYSIS OF ADHESIONS  03/19/2019   Dr. Romana Juniper  .  LAPAROSCOPY N/A 06/19/2018   Procedure: LAPAROSCOPY DIAGNOSTIC;  Surgeon: Greer Pickerel, MD;  [facility];  Service: General;  Laterality: N/A;  . LAPAROSCOPY N/A 03/19/2019   Procedure: LAPAROSCOPY DIAGNOSTIC lysis of adhesions;  Surgeon: Clovis Riley, MD;  [facility];  Service: General;  Laterality: N/A;  . LUMBAR LAMINECTOMY/DECOMPRESSION MICRODISCECTOMY Right 10/11/2018   Procedure: LAMINECTOMY AND FORAMINOTOMY RIGHT LUMBAR FOUR- LUMBAR FIVE;  Surgeon: Consuella Lose, MD;  Location: [place];  Service: Neurosurgery;  Laterality: Right;  . SHOULDER ARTHROSCOPY  7/12   left-dsc  . TONSILLECTOMY  at age 87  . TOTAL SHOULDER ARTHROPLASTY Left 08/11/2020   Procedure: TOTAL SHOULDER  ARTHROPLASTY;  Surgeon: Marchia Bond, MD;  Location: [place];  Service: Orthopedics;  Laterality: Left;  . TRIGGER FINGER RELEASE  12/20/2012   Procedure: RELEASE TRIGGER FINGER/A-1 PULLEY;  Surgeon: Tennis Must, MD;  Location: [place];  Service: Orthopedics;  Laterality: Left;  LEFT TRIGGER THUMB RELEASE    There were no vitals filed for this visit.   Subjective Assessment - 04/07/21 0832    Subjective COVID-19 screen performed prior to patient entering clinic.  About the same.    Pertinent History DM, Bi-polar, DJD, DM, ankle surgery, OA., GERD, hypothyroidism, left TSA, lumbar surgery, trigger finger release.    Patient Stated Goals Get rid of pain.    Currently in Pain? Yes    Pain Score 8     Pain Location Thoracic    Pain Orientation Right;Left    Pain Descriptors / Indicators Aching;Sharp    Pain Type Acute pain    Pain Onset More than a month ago                             [facility] Adult PT Treatment/Exercise - 04/07/21 0001      Moist Heat Therapy   Number Minutes Moist Heat 20 Minutes    Moist Heat Location Cervical      Electrical Stimulation   Electrical Stimulation Location C-T region bilaterally.    Electrical Stimulation Action IFC at 80-150 Hz on 40% scan x 20 minutes.    Electrical Stimulation Goals Pain      Ultrasound   Ultrasound Location Bil C-T region.    Ultrasound Parameters Combo e'stim/US at 1.50 W/CM2 x 12 minutes.    Ultrasound Goals Pain      Manual Therapy   Manual Therapy Soft tissue mobilization    Soft tissue mobilization STW/M x 11 minutes to affected C-T region.                       PT Long Term Goals - 04/02/21 1037      PT LONG TERM GOAL #1   Title Independent with a HEP and progression.    Time 6    Period Weeks    Status New      PT LONG TERM GOAL #2   Title Increase active cervical rotation to 70 degrees+ so patient can turn head more easily while driving.     Time 6    Period Weeks    Status New      PT LONG TERM GOAL #3   Title Perform ADL's with pain not > 2-3/10.    Time 6    Period Weeks    Status New  Plan - 04/07/21 0839    Clinical Impression Statement Patient with continued pain ta C-T junction.  May consider trying int cervical traction beginning at 12#.           Patient will benefit from skilled therapeutic intervention in order to improve the following deficits and impairments:     Visit Diagnosis: Cervicalgia  Pain in thoracic spine  Abnormal posture     Problem List Patient Active Problem List   Diagnosis Date Noted  . Osteoarthritis of left shoulder 08/11/2020  . S/P shoulder replacement, left 08/11/2020  . AMS (altered mental status) 05/19/2019  . Thrombocytopenia ([place]) 05/19/2019  . SBO (small bowel obstruction) s/p lap LOA 03/19/2019 03/19/2019  . Chronic migraine without aura, with intractable migraine, so stated, with status migrainosus 11/20/2018  . Lumbar radiculopathy 10/11/2018  . Atelectasis   . Hypoglycemia   . Hypotension   . Bradycardia   . Epigastric pain 06/19/2018  . History of adenomatous polyp of colon 10/23/2017  . Hyperlipidemia associated with type 2 diabetes mellitus ([place]) 07/06/2017  . GERD (gastroesophageal reflux disease) 03/21/2017  . History of Roux-en-Y gastric bypass 2018 03/21/2017  . Morbid obesity with BMI of 40.0-44.9, adult ([place]) 06/06/2016  . Intractable chronic migraine without aura 06/04/2015  . Abnormal uterine bleeding 05/13/2015  . Pancreatitis 02/27/2014  . Fatty liver disease, nonalcoholic 81/44/8185  . Bipolar disorder ([place]) 02/20/2014  . Metabolic syndrome 63/14/9702  . Vitamin D deficiency   . DM (diabetes mellitus) ([place]) 03/12/2013  . Surgery, elective 06/12/2011  . Hypothyroidism   . Constipation, chronic   . Vitamin B 12 deficiency   . ALLERGIC RHINITIS 12/31/2010  . BARRETTS ESOPHAGUS 12/31/2010  . Gastroparesis  12/31/2010  . Bilateral chronic knee pain 12/31/2010  . Obstructive sleep apnea 09/27/2010  . Migraine 09/27/2010  . RESPIRATORY FAILURE, CHRONIC 09/27/2010  . Cyst of ovary 10/19/2009    Takeira Yanes, [place] MPT 04/07/2021, 8:45 AM  [facility] [place], [place], [address] Phone: [phone]   Fax:  [phone]  Name: Morgan [name] MRN: [id number] Date of Birth: 01/07/1966

## 2021-04-09 DIAGNOSIS — Z9884 Bariatric surgery status: Secondary | ICD-10-CM | POA: Diagnosis not present

## 2021-04-09 DIAGNOSIS — K5909 Other constipation: Secondary | ICD-10-CM | POA: Diagnosis not present

## 2021-04-12 ENCOUNTER — Other Ambulatory Visit: Payer: Self-pay

## 2021-04-12 ENCOUNTER — Ambulatory Visit: Payer: Medicare Other | Attending: Physical Medicine and Rehabilitation | Admitting: Physical Therapy

## 2021-04-12 DIAGNOSIS — M542 Cervicalgia: Secondary | ICD-10-CM | POA: Diagnosis not present

## 2021-04-12 DIAGNOSIS — M546 Pain in thoracic spine: Secondary | ICD-10-CM | POA: Diagnosis not present

## 2021-04-12 DIAGNOSIS — R293 Abnormal posture: Secondary | ICD-10-CM | POA: Diagnosis not present

## 2021-04-12 NOTE — Therapy (Signed)
[facility] [place], [place], [address] Phone: [phone]   Fax:  [phone]  Physical Therapy Treatment  Patient Details  Name: Morgan [name] MRN: [id number] Date of Birth: August 20, 1966 Referring Provider (PT): Laroy Apple MD   Encounter Date: 04/12/2021   PT End of Session - 04/12/21 0821    Visit Number 4    Number of Visits 12    Date for PT Re-Evaluation 05/14/21    Authorization Type FOTO.    PT Start Time [phone]    PT Stop Time 0827    PT Time Calculation (min) 56 min    Activity Tolerance Patient tolerated treatment well    Behavior During Therapy [facility] for tasks assessed/performed           Past Medical History:  Diagnosis Date  . Allergic rhinitis   . Anemia     after gastric bypass in 2018  . Anxiety   . Barrett's esophagus   . Bipolar affective disorder ([place])   . CAP (community acquired pneumonia) 07/20/2018  . Chronic respiratory failure ([place])   . Constipation, chronic   . Degenerative joint disease of spine   . Depression   . Diabetes mellitus without complication ([place])    type 2   . DM (diabetes mellitus) ([place])    "hypoglycemic" per pt - no longer takes DM meds due to weight loss from gastric bypass in 2018  . Dry eye   . Family history of adverse reaction to anesthesia    nausea and vomiting  . Gastroparesis   . GERD (gastroesophageal reflux disease)   . H/O shoulder replacement 08/11/2020   left shoulder  . History of bariatric surgery 03/2017  . History of hiatal hernia   . HLD (hyperlipidemia) 03/12/2013  . Hyperlipidemia   . Hypertension    No HTN meds since weight loss from Gastric Bypass in 2018  . Intractable chronic migraine without aura 06/04/2015  . Migraine headache   . Morbid obesity ([place])   . OSA (obstructive sleep apnea)    No cpap since gastric surgery  . Osteoarthritis    bilateral knee  . SBO (small bowel obstruction) ([place]) 03/19/2019  . Sleep apnea   . Unspecified  hypothyroidism   . Vitamin B 12 deficiency   . Vitamin D deficiency     Past Surgical History:  Procedure Laterality Date  . ANKLE ARTHROSCOPY WITH RECONSTRUCTION Right 09/24/2019   Procedure: ANKLE ARTHROSCOPY DEBRIDEMENT TREATMENT OF OSTEOCHONDRAL LESION TALUS. PRONEAL TENDON DEBRIDEMENT;  Surgeon: Erle Crocker, MD;  Location: [place];  Service: Orthopedics;  Laterality: Right;  SURGERY REQUEST TIME 2 HOURS  . APPENDECTOMY  1978  . CHOLECYSTECTOMY  2005  . COLONOSCOPY    . disc repair  05/17/2019   with rods, lumbar spine  . GASTRIC ROUX-EN-Y N/A 03/21/2017   Procedure: LAPAROSCOPIC ROUX-EN-Y GASTRIC, UPPER ENDO;  Surgeon: Greer Pickerel, MD;  [facility];  Service: General;  Laterality: N/A;  . GASTROSTOMY N/A 06/19/2018   Procedure: LAPRASCOPIC INSERTION OF GASTROSTOMY TUBE;  Surgeon: Greer Pickerel, MD;  [facility];  Service: General;  Laterality: N/A;  . GASTROSTOMY TUBE PLACEMENT Left    06/2018  . HERNIA REPAIR    . HIATAL HERNIA REPAIR N/A 06/19/2018   Procedure: LAPAROSCOPIC REPAIR OF HIATAL HERNIA;  Surgeon: Greer Pickerel, MD;  [facility];  Service: General;  Laterality: N/A;  . LAPAROSCOPIC LYSIS OF ADHESIONS  03/19/2019   Dr. Romana Juniper  .  LAPAROSCOPY N/A 06/19/2018   Procedure: LAPAROSCOPY DIAGNOSTIC;  Surgeon: Greer Pickerel, MD;  [facility];  Service: General;  Laterality: N/A;  . LAPAROSCOPY N/A 03/19/2019   Procedure: LAPAROSCOPY DIAGNOSTIC lysis of adhesions;  Surgeon: Clovis Riley, MD;  [facility];  Service: General;  Laterality: N/A;  . LUMBAR LAMINECTOMY/DECOMPRESSION MICRODISCECTOMY Right 10/11/2018   Procedure: LAMINECTOMY AND FORAMINOTOMY RIGHT LUMBAR FOUR- LUMBAR FIVE;  Surgeon: Consuella Lose, MD;  Location: [place];  Service: Neurosurgery;  Laterality: Right;  . SHOULDER ARTHROSCOPY  7/12   left-dsc  . TONSILLECTOMY  at age 29  . TOTAL SHOULDER ARTHROPLASTY Left 08/11/2020   Procedure: TOTAL SHOULDER  ARTHROPLASTY;  Surgeon: Marchia Bond, MD;  Location: [place];  Service: Orthopedics;  Laterality: Left;  . TRIGGER FINGER RELEASE  12/20/2012   Procedure: RELEASE TRIGGER FINGER/A-1 PULLEY;  Surgeon: Tennis Must, MD;  Location: [place];  Service: Orthopedics;  Laterality: Left;  LEFT TRIGGER THUMB RELEASE    There were no vitals filed for this visit.   Subjective Assessment - 04/12/21 0831    Subjective COVID-19 screen performed prior to patient entering clinic.  Pain at a 7, left > right today.    Pertinent History DM, Bi-polar, DJD, DM, ankle surgery, OA., GERD, hypothyroidism, left TSA, lumbar surgery, trigger finger release.    Patient Stated Goals Get rid of pain.    Currently in Pain? Yes    Pain Score 7     Pain Location Thoracic    Pain Orientation Right;Left    Pain Descriptors / Indicators Aching;Sharp    Pain Onset More than a month ago                             [facility] Adult PT Treatment/Exercise - 04/12/21 0001      Electrical Stimulation   Electrical Stimulation Location C-T region.    Electrical Stimulation Action IFC at 80-150 Hz.    Electrical Stimulation Parameters 40% scan x 20 minutes.    Electrical Stimulation Goals Pain      Ultrasound   Ultrasound Location Bil C-T region.    Ultrasound Parameters Combo e'stim/US at 1.50 W/CM2 x 12 minutes.    Ultrasound Goals Pain      Manual Therapy   Manual Therapy Soft tissue mobilization    Soft tissue mobilization In prone:  Gentle PA mobs to upper thoracic and gentle mobs to costo-vertebral mobs f/b STW/M x 11 minutes.                       PT Long Term Goals - 04/02/21 1037      PT LONG TERM GOAL #1   Title Independent with a HEP and progression.    Time 6    Period Weeks    Status New      PT LONG TERM GOAL #2   Title Increase active cervical rotation to 70 degrees+ so patient can turn head more easily while driving.    Time 6     Period Weeks    Status New      PT LONG TERM GOAL #3   Title Perform ADL's with pain not > 2-3/10.    Time 6    Period Weeks    Status New                 Plan - 04/12/21 0837    Clinical Impression  Statement Very good response to treatment.  Patient did great with gentle PA upper thoracic mobs and  costovertebral mobs.  She felt better after treatment.    Personal Factors and Comorbidities Comorbidity 1;Comorbidity 3+;Comorbidity 2    Comorbidities DM, Bi-polar, DJD, DM, ankle surgery.    Examination-Participation Restrictions Other    Stability/Clinical Decision Making Stable/Uncomplicated    Rehab Potential Excellent    PT Frequency 2x / week    PT Duration 6 weeks    PT Treatment/Interventions ADLs/Self Care Home Management;Cryotherapy;Electrical Stimulation;Ultrasound;Traction;Moist Heat;Therapeutic activities;Therapeutic exercise;Manual techniques;Patient/family education;Joint Manipulations;Spinal Manipulations;Passive range of motion    PT Next Visit Plan PA and costo-vertebral mobs, chin tucks and extension, combo e'stim/US, HMP/E'stim.    Consulted and Agree with Plan of Care Patient           Patient will benefit from skilled therapeutic intervention in order to improve the following deficits and impairments:  Pain,Decreased activity tolerance,Decreased range of motion,Increased edema  Visit Diagnosis: Cervicalgia  Pain in thoracic spine  Abnormal posture     Problem List Patient Active Problem List   Diagnosis Date Noted  . Osteoarthritis of left shoulder 08/11/2020  . S/P shoulder replacement, left 08/11/2020  . AMS (altered mental status) 05/19/2019  . Thrombocytopenia ([place]) 05/19/2019  . SBO (small bowel obstruction) s/p lap LOA 03/19/2019 03/19/2019  . Chronic migraine without aura, with intractable migraine, so stated, with status migrainosus 11/20/2018  . Lumbar radiculopathy 10/11/2018  . Atelectasis   . Hypoglycemia   . Hypotension   .  Bradycardia   . Epigastric pain 06/19/2018  . History of adenomatous polyp of colon 10/23/2017  . Hyperlipidemia associated with type 2 diabetes mellitus ([place]) 07/06/2017  . GERD (gastroesophageal reflux disease) 03/21/2017  . History of Roux-en-Y gastric bypass 2018 03/21/2017  . Morbid obesity with BMI of 40.0-44.9, adult ([place]) 06/06/2016  . Intractable chronic migraine without aura 06/04/2015  . Abnormal uterine bleeding 05/13/2015  . Pancreatitis 02/27/2014  . Fatty liver disease, nonalcoholic 56/81/2751  . Bipolar disorder ([place]) 02/20/2014  . Metabolic syndrome 70/12/7492  . Vitamin D deficiency   . DM (diabetes mellitus) ([place]) 03/12/2013  . Surgery, elective 06/12/2011  . Hypothyroidism   . Constipation, chronic   . Vitamin B 12 deficiency   . ALLERGIC RHINITIS 12/31/2010  . BARRETTS ESOPHAGUS 12/31/2010  . Gastroparesis 12/31/2010  . Bilateral chronic knee pain 12/31/2010  . Obstructive sleep apnea 09/27/2010  . Migraine 09/27/2010  . RESPIRATORY FAILURE, CHRONIC 09/27/2010  . Cyst of ovary 10/19/2009    Eldonna Neuenfeldt, [place] MPT 04/12/2021, 8:41 AM  [facility] [address] [place], [place], [address] Phone: [phone]   Fax:  [phone]  Name: Morgan [name] MRN: [id number] Date of Birth: 08-12-1966

## 2021-04-14 ENCOUNTER — Ambulatory Visit: Payer: Medicare Other | Admitting: Physical Therapy

## 2021-04-14 ENCOUNTER — Encounter: Payer: Self-pay | Admitting: Physical Therapy

## 2021-04-14 ENCOUNTER — Other Ambulatory Visit: Payer: Self-pay

## 2021-04-14 DIAGNOSIS — R293 Abnormal posture: Secondary | ICD-10-CM

## 2021-04-14 DIAGNOSIS — M542 Cervicalgia: Secondary | ICD-10-CM | POA: Diagnosis not present

## 2021-04-14 DIAGNOSIS — M546 Pain in thoracic spine: Secondary | ICD-10-CM

## 2021-04-14 NOTE — Therapy (Signed)
[facility] [place], [place], [address] Phone: [phone]   Fax:  [phone]  Physical Therapy Treatment  Patient Details  Name: Morgan [name] MRN: [id number] Date of Birth: 02-16-1966 Referring Provider (PT): Laroy Apple MD   Encounter Date: 04/14/2021   PT End of Session - 04/14/21 0821    Visit Number 5    Number of Visits 12    Date for PT Re-Evaluation 05/14/21    Authorization Type FOTO.    PT Start Time 0730    PT Stop Time 0810    PT Time Calculation (min) 40 min    Activity Tolerance Patient tolerated treatment well    Behavior During Therapy WFL for tasks assessed/performed           Past Medical History:  Diagnosis Date  . Allergic rhinitis   . Anemia     after gastric bypass in 2018  . Anxiety   . Barrett's esophagus   . Bipolar affective disorder ([place])   . CAP (community acquired pneumonia) 07/20/2018  . Chronic respiratory failure ([place])   . Constipation, chronic   . Degenerative joint disease of spine   . Depression   . Diabetes mellitus without complication ([place])    type 2   . DM (diabetes mellitus) ([place])    "hypoglycemic" per pt - no longer takes DM meds due to weight loss from gastric bypass in 2018  . Dry eye   . Family history of adverse reaction to anesthesia    nausea and vomiting  . Gastroparesis   . GERD (gastroesophageal reflux disease)   . H/O shoulder replacement 08/11/2020   left shoulder  . History of bariatric surgery 03/2017  . History of hiatal hernia   . HLD (hyperlipidemia) 03/12/2013  . Hyperlipidemia   . Hypertension    No HTN meds since weight loss from Gastric Bypass in 2018  . Intractable chronic migraine without aura 06/04/2015  . Migraine headache   . Morbid obesity ([place])   . OSA (obstructive sleep apnea)    No cpap since gastric surgery  . Osteoarthritis    bilateral knee  . SBO (small bowel obstruction) ([place]) 03/19/2019  . Sleep apnea   . Unspecified  hypothyroidism   . Vitamin B 12 deficiency   . Vitamin D deficiency     Past Surgical History:  Procedure Laterality Date  . ANKLE ARTHROSCOPY WITH RECONSTRUCTION Right 09/24/2019   Procedure: ANKLE ARTHROSCOPY DEBRIDEMENT TREATMENT OF OSTEOCHONDRAL LESION TALUS. PRONEAL TENDON DEBRIDEMENT;  Surgeon: Erle Crocker, MD;  Location: [place];  Service: Orthopedics;  Laterality: Right;  SURGERY REQUEST TIME 2 HOURS  . APPENDECTOMY  1978  . CHOLECYSTECTOMY  2005  . COLONOSCOPY    . disc repair  05/17/2019   with rods, lumbar spine  . GASTRIC ROUX-EN-Y N/A 03/21/2017   Procedure: LAPAROSCOPIC ROUX-EN-Y GASTRIC, UPPER ENDO;  Surgeon: Greer Pickerel, MD;  [facility];  Service: General;  Laterality: N/A;  . GASTROSTOMY N/A 06/19/2018   Procedure: LAPRASCOPIC INSERTION OF GASTROSTOMY TUBE;  Surgeon: Greer Pickerel, MD;  [facility];  Service: General;  Laterality: N/A;  . GASTROSTOMY TUBE PLACEMENT Left    06/2018  . HERNIA REPAIR    . HIATAL HERNIA REPAIR N/A 06/19/2018   Procedure: LAPAROSCOPIC REPAIR OF HIATAL HERNIA;  Surgeon: Greer Pickerel, MD;  [facility];  Service: General;  Laterality: N/A;  . LAPAROSCOPIC LYSIS OF ADHESIONS  03/19/2019   Dr. Romana Juniper  .  LAPAROSCOPY N/A 06/19/2018   Procedure: LAPAROSCOPY DIAGNOSTIC;  Surgeon: Greer Pickerel, MD;  [facility];  Service: General;  Laterality: N/A;  . LAPAROSCOPY N/A 03/19/2019   Procedure: LAPAROSCOPY DIAGNOSTIC lysis of adhesions;  Surgeon: Clovis Riley, MD;  [facility];  Service: General;  Laterality: N/A;  . LUMBAR LAMINECTOMY/DECOMPRESSION MICRODISCECTOMY Right 10/11/2018   Procedure: LAMINECTOMY AND FORAMINOTOMY RIGHT LUMBAR FOUR- LUMBAR FIVE;  Surgeon: Consuella Lose, MD;  Location: [place];  Service: Neurosurgery;  Laterality: Right;  . SHOULDER ARTHROSCOPY  7/12   left-dsc  . TONSILLECTOMY  at age 80  . TOTAL SHOULDER ARTHROPLASTY Left 08/11/2020   Procedure: TOTAL SHOULDER  ARTHROPLASTY;  Surgeon: Marchia Bond, MD;  Location: [place];  Service: Orthopedics;  Laterality: Left;  . TRIGGER FINGER RELEASE  12/20/2012   Procedure: RELEASE TRIGGER FINGER/A-1 PULLEY;  Surgeon: Tennis Must, MD;  Location: [place];  Service: Orthopedics;  Laterality: Left;  LEFT TRIGGER THUMB RELEASE    There were no vitals filed for this visit.   Subjective Assessment - 04/14/21 0820    Subjective COVID-19 screen performed prior to patient entering clinic.  Last treatment helped.    Pertinent History DM, Bi-polar, DJD, DM, ankle surgery, OA., GERD, hypothyroidism, left TSA, lumbar surgery, trigger finger release.    Currently in Pain? Yes    Pain Score 5     Pain Location Thoracic    Pain Orientation Right;Left    Pain Descriptors / Indicators Aching;Sharp    Pain Type Acute pain    Pain Onset More than a month ago                             [facility] Adult PT Treatment/Exercise - 04/14/21 0001      Modalities   Modalities Electrical Stimulation;Moist Heat      Moist Heat Therapy   Number Minutes Moist Heat 10 Minutes    Moist Heat Location --   C-T spine.     Acupuncturist Location Bilateral C-T region.    Electrical Stimulation Action IFC at 80-150 Hz.    Electrical Stimulation Parameters 40% scan x 10 minutes.    Electrical Stimulation Goals Pain      Manual Therapy   Manual Therapy Soft tissue mobilization    Soft tissue mobilization In prone:  Gentle thoracic and costo-vertebral mobs f/b STW/M over patient's upper thoracic and cervical musculature x 23 minutes.                       PT Long Term Goals - 04/02/21 1037      PT LONG TERM GOAL #1   Title Independent with a HEP and progression.    Time 6    Period Weeks    Status New      PT LONG TERM GOAL #2   Title Increase active cervical rotation to 70 degrees+ so patient can turn head more easily while  driving.    Time 6    Period Weeks    Status New      PT LONG TERM GOAL #3   Title Perform ADL's with pain not > 2-3/10.    Time 6    Period Weeks    Status New                 Plan - 04/14/21 0826    Clinical Impression Statement  Excellent response to last and this treatment.  Patient reporting a 2 point reduction in pain-level upon presentation to the clinic today and felt good after treatment today.    Personal Factors and Comorbidities Comorbidity 1;Comorbidity 3+;Comorbidity 2    Comorbidities DM, Bi-polar, DJD, DM, ankle surgery.    Examination-Participation Restrictions Other    Stability/Clinical Decision Making Stable/Uncomplicated    Rehab Potential Excellent    PT Frequency 2x / week    PT Duration 6 weeks    PT Treatment/Interventions ADLs/Self Care Home Management;Cryotherapy;Electrical Stimulation;Ultrasound;Traction;Moist Heat;Therapeutic activities;Therapeutic exercise;Manual techniques;Patient/family education;Joint Manipulations;Spinal Manipulations;Passive range of motion    PT Next Visit Plan PA and costo-vertebral mobs, chin tucks and extension, combo e'stim/US, HMP/E'stim.    Consulted and Agree with Plan of Care Patient           Patient will benefit from skilled therapeutic intervention in order to improve the following deficits and impairments:  Pain,Decreased activity tolerance,Decreased range of motion,Increased edema  Visit Diagnosis: Cervicalgia  Pain in thoracic spine  Abnormal posture     Problem List Patient Active Problem List   Diagnosis Date Noted  . Osteoarthritis of left shoulder 08/11/2020  . S/P shoulder replacement, left 08/11/2020  . AMS (altered mental status) 05/19/2019  . Thrombocytopenia ([place]) 05/19/2019  . SBO (small bowel obstruction) s/p lap LOA 03/19/2019 03/19/2019  . Chronic migraine without aura, with intractable migraine, so stated, with status migrainosus 11/20/2018  . Lumbar radiculopathy 10/11/2018  .  Atelectasis   . Hypoglycemia   . Hypotension   . Bradycardia   . Epigastric pain 06/19/2018  . History of adenomatous polyp of colon 10/23/2017  . Hyperlipidemia associated with type 2 diabetes mellitus ([place]) 07/06/2017  . GERD (gastroesophageal reflux disease) 03/21/2017  . History of Roux-en-Y gastric bypass 2018 03/21/2017  . Morbid obesity with BMI of 40.0-44.9, adult ([place]) 06/06/2016  . Intractable chronic migraine without aura 06/04/2015  . Abnormal uterine bleeding 05/13/2015  . Pancreatitis 02/27/2014  . Fatty liver disease, nonalcoholic 32/54/9826  . Bipolar disorder ([place]) 02/20/2014  . Metabolic syndrome 41/58/3094  . Vitamin D deficiency   . DM (diabetes mellitus) ([place]) 03/12/2013  . Surgery, elective 06/12/2011  . Hypothyroidism   . Constipation, chronic   . Vitamin B 12 deficiency   . ALLERGIC RHINITIS 12/31/2010  . BARRETTS ESOPHAGUS 12/31/2010  . Gastroparesis 12/31/2010  . Bilateral chronic knee pain 12/31/2010  . Obstructive sleep apnea 09/27/2010  . Migraine 09/27/2010  . RESPIRATORY FAILURE, CHRONIC 09/27/2010  . Cyst of ovary 10/19/2009    Annora Guderian, [place]  MPT 04/14/2021, 8:31 AM  [facility] [address] [place], [place], [address] Phone: [phone]   Fax:  [phone]  Name: MYRTIS MAILLE MRN: [id number] Date of Birth: 09-02-1966

## 2021-04-19 ENCOUNTER — Ambulatory Visit: Payer: Medicare Other | Admitting: Physical Therapy

## 2021-04-19 ENCOUNTER — Other Ambulatory Visit: Payer: Self-pay

## 2021-04-19 DIAGNOSIS — M542 Cervicalgia: Secondary | ICD-10-CM

## 2021-04-19 DIAGNOSIS — M546 Pain in thoracic spine: Secondary | ICD-10-CM

## 2021-04-19 DIAGNOSIS — R293 Abnormal posture: Secondary | ICD-10-CM | POA: Diagnosis not present

## 2021-04-19 NOTE — Therapy (Signed)
[facility] [place], [place], [address] Phone: [phone]   Fax:  [phone]  Physical Therapy Treatment  Patient Details  Name: Morgan [name] MRN: [id number] Date of Birth: [birth year] Referring Provider (PT): Laroy Apple MD   Encounter Date: 04/19/2021   PT End of Session - 04/19/21 0921    Visit Number 6    Number of Visits 12    Date for PT Re-Evaluation 05/14/21    PT Start Time 0730    PT Stop Time 0819    PT Time Calculation (min) 49 min    Activity Tolerance Patient tolerated treatment well    Behavior During Therapy [facility] for tasks assessed/performed           Past Medical History:  Diagnosis Date  . Allergic rhinitis   . Anemia     after gastric bypass in 2018  . Anxiety   . Barrett's esophagus   . Bipolar affective disorder ([place])   . CAP (community acquired pneumonia) 07/20/2018  . Chronic respiratory failure ([place])   . Constipation, chronic   . Degenerative joint disease of spine   . Depression   . Diabetes mellitus without complication ([place])    type 2   . DM (diabetes mellitus) ([place])    "hypoglycemic" per pt - no longer takes DM meds due to weight loss from gastric bypass in 2018  . Dry eye   . Family history of adverse reaction to anesthesia    nausea and vomiting  . Gastroparesis   . GERD (gastroesophageal reflux disease)   . H/O shoulder replacement 08/11/2020   left shoulder  . History of bariatric surgery 03/2017  . History of hiatal hernia   . HLD (hyperlipidemia) 03/12/2013  . Hyperlipidemia   . Hypertension    No HTN meds since weight loss from Gastric Bypass in 2018  . Intractable chronic migraine without aura 06/04/2015  . Migraine headache   . Morbid obesity ([place])   . OSA (obstructive sleep apnea)    No cpap since gastric surgery  . Osteoarthritis    bilateral knee  . SBO (small bowel obstruction) ([place]) 03/19/2019  . Sleep apnea   . Unspecified hypothyroidism   . Vitamin B  12 deficiency   . Vitamin D deficiency     Past Surgical History:  Procedure Laterality Date  . ANKLE ARTHROSCOPY WITH RECONSTRUCTION Right 09/24/2019   Procedure: ANKLE ARTHROSCOPY DEBRIDEMENT TREATMENT OF OSTEOCHONDRAL LESION TALUS. PRONEAL TENDON DEBRIDEMENT;  Surgeon: Erle Crocker, MD;  Location: [facility];  Service: Orthopedics;  Laterality: Right;  SURGERY REQUEST TIME 2 HOURS  . APPENDECTOMY  1978  . CHOLECYSTECTOMY  2005  . COLONOSCOPY    . disc repair  05/17/2019   with rods, lumbar spine  . GASTRIC ROUX-EN-Y N/A 03/21/2017   Procedure: LAPAROSCOPIC ROUX-EN-Y GASTRIC, UPPER ENDO;  Surgeon: Greer Pickerel, MD;  [facility];  Service: General;  Laterality: N/A;  . GASTROSTOMY N/A 06/19/2018   Procedure: LAPRASCOPIC INSERTION OF GASTROSTOMY TUBE;  Surgeon: Greer Pickerel, MD;  [facility];  Service: General;  Laterality: N/A;  . GASTROSTOMY TUBE PLACEMENT Left    06/2018  . HERNIA REPAIR    . HIATAL HERNIA REPAIR N/A 06/19/2018   Procedure: LAPAROSCOPIC REPAIR OF HIATAL HERNIA;  Surgeon: Greer Pickerel, MD;  [facility];  Service: General;  Laterality: N/A;  . LAPAROSCOPIC LYSIS OF ADHESIONS  03/19/2019   Dr. Romana Juniper  . LAPAROSCOPY N/A 06/19/2018  Procedure: LAPAROSCOPY DIAGNOSTIC;  Surgeon: Greer Pickerel, MD;  [facility];  Service: General;  Laterality: N/A;  . LAPAROSCOPY N/A 03/19/2019   Procedure: LAPAROSCOPY DIAGNOSTIC lysis of adhesions;  Surgeon: Clovis Riley, MD;  [facility];  Service: General;  Laterality: N/A;  . LUMBAR LAMINECTOMY/DECOMPRESSION MICRODISCECTOMY Right 10/11/2018   Procedure: LAMINECTOMY AND FORAMINOTOMY RIGHT LUMBAR FOUR- LUMBAR FIVE;  Surgeon: Consuella Lose, MD;  Location: [facility];  Service: Neurosurgery;  Laterality: Right;  . SHOULDER ARTHROSCOPY  7/12   left-dsc  . TONSILLECTOMY  at age 72  . TOTAL SHOULDER ARTHROPLASTY Left 08/11/2020   Procedure: TOTAL SHOULDER ARTHROPLASTY;  Surgeon: Marchia Bond, MD;  Location: [facility];  Service: Orthopedics;  Laterality: Left;  . TRIGGER FINGER RELEASE  12/20/2012   Procedure: RELEASE TRIGGER FINGER/A-1 PULLEY;  Surgeon: Tennis Must, MD;  Location: [facility];  Service: Orthopedics;  Laterality: Left;  LEFT TRIGGER THUMB RELEASE    There were no vitals filed for this visit.   Subjective Assessment - 04/19/21 0808    Subjective COVID-19 screen performed prior to patient entering clinic.  Pain about a 6.  More on left today.    Pertinent History DM, Bi-polar, DJD, DM, ankle surgery, OA., GERD, hypothyroidism, left TSA, lumbar surgery, trigger finger release.    Patient Stated Goals Get rid of pain.    Currently in Pain? Yes    Pain Score 6     Pain Location Thoracic    Pain Orientation Right;Left                             [facility] Adult PT Treatment/Exercise - 04/19/21 0001      Exercises   Exercises Shoulder      Shoulder Exercises: Standing   Other Standing Exercises Red Theraband scapular retraction to fatigue.      Shoulder Exercises: ROM/Strengthening   UBE (Upper Arm Bike) 90 RPM's x 5 minutes.      Modalities   Modalities Electrical Stimulation;Moist Heat;Ultrasound      Moist Heat Therapy   Number Minutes Moist Heat 15 Minutes    Moist Heat Location --   Thoracic.     Acupuncturist Location Left upper thoracic.    Electrical Stimulation Action Pre-mod.    Electrical Stimulation Parameters 80-150 Hz x 15 minutes.    Electrical Stimulation Goals Pain      Ultrasound   Ultrasound Location Left upper thoracic.    Ultrasound Parameters Small soundhead:  Combo e'tim/US at 1.50 W/CM x 10 minutes.      Manual Therapy   Manual Therapy Soft tissue mobilization    Soft tissue mobilization STW/M x 7 minutes to patient's left upper thoracic region.                  PT Education - 04/19/21 6045    Education Details Red theraband  scapular retraction.    Person(s) Educated Patient    Methods Explanation;Demonstration    Comprehension Verbalized understanding;Returned demonstration               PT Long Term Goals - 04/02/21 1037      PT LONG TERM GOAL #1   Title Independent with a HEP and progression.    Time 6    Period Weeks    Status New      PT LONG TERM GOAL #2   Title Increase active  cervical rotation to 70 degrees+ so patient can turn head more easily while driving.    Time 6    Period Weeks    Status New      PT LONG TERM GOAL #3   Title Perform ADL's with pain not > 2-3/10.    Time 6    Period Weeks    Status New                 Plan - 04/19/21 0867    Clinical Impression Statement Patient did well with ther ex with excellent technique.  Red theraband provided for HEP.    Personal Factors and Comorbidities Comorbidity 1;Comorbidity 3+;Comorbidity 2    Comorbidities DM, Bi-polar, DJD, DM, ankle surgery.    Examination-Activity Limitations Other    Examination-Participation Restrictions Other    Stability/Clinical Decision Making Stable/Uncomplicated    Rehab Potential Excellent    PT Frequency 2x / week    PT Duration 6 weeks    PT Treatment/Interventions ADLs/Self Care Home Management;Cryotherapy;Electrical Stimulation;Ultrasound;Traction;Moist Heat;Therapeutic activities;Therapeutic exercise;Manual techniques;Patient/family education;Joint Manipulations;Spinal Manipulations;Passive range of motion    PT Next Visit Plan PA and costo-vertebral mobs, chin tucks and extension, combo e'stim/US, HMP/E'stim.    Consulted and Agree with Plan of Care Patient           Patient will benefit from skilled therapeutic intervention in order to improve the following deficits and impairments:     Visit Diagnosis: Cervicalgia  Pain in thoracic spine     Problem List Patient Active Problem List   Diagnosis Date Noted  . Osteoarthritis of left shoulder 08/11/2020  . S/P shoulder  replacement, left 08/11/2020  . AMS (altered mental status) 05/19/2019  . Thrombocytopenia ([place]) 05/19/2019  . SBO (small bowel obstruction) s/p lap LOA 03/19/2019 03/19/2019  . Chronic migraine without aura, with intractable migraine, so stated, with status migrainosus 11/20/2018  . Lumbar radiculopathy 10/11/2018  . Atelectasis   . Hypoglycemia   . Hypotension   . Bradycardia   . Epigastric pain 06/19/2018  . History of adenomatous polyp of colon 10/23/2017  . Hyperlipidemia associated with type 2 diabetes mellitus ([place]) 07/06/2017  . GERD (gastroesophageal reflux disease) 03/21/2017  . History of Roux-en-Y gastric bypass 2018 03/21/2017  . Morbid obesity with BMI of 40.0-44.9, adult ([place]) 06/06/2016  . Intractable chronic migraine without aura 06/04/2015  . Abnormal uterine bleeding 05/13/2015  . Pancreatitis 02/27/2014  . Fatty liver disease, nonalcoholic 61/95/0932  . Bipolar disorder ([place]) 02/20/2014  . Metabolic syndrome 67/11/4579  . Vitamin D deficiency   . DM (diabetes mellitus) ([place]) 03/12/2013  . Surgery, elective 06/12/2011  . Hypothyroidism   . Constipation, chronic   . Vitamin B 12 deficiency   . ALLERGIC RHINITIS 12/31/2010  . BARRETTS ESOPHAGUS 12/31/2010  . Gastroparesis 12/31/2010  . Bilateral chronic knee pain 12/31/2010  . Obstructive sleep apnea 09/27/2010  . Migraine 09/27/2010  . RESPIRATORY FAILURE, CHRONIC 09/27/2010  . Cyst of ovary 10/19/2009    Zyiere Rosemond, [place] MPT 04/19/2021, 9:22 AM  [facility] [place], [place], [address] Phone: [phone]   Fax:  [phone]  Name: Morgan [name] MRN: [id number] Date of Birth: 1966-08-03

## 2021-04-21 ENCOUNTER — Ambulatory Visit: Payer: Medicare Other | Admitting: Physical Therapy

## 2021-04-21 ENCOUNTER — Other Ambulatory Visit: Payer: Self-pay

## 2021-04-21 DIAGNOSIS — M542 Cervicalgia: Secondary | ICD-10-CM | POA: Diagnosis not present

## 2021-04-21 DIAGNOSIS — R293 Abnormal posture: Secondary | ICD-10-CM

## 2021-04-21 DIAGNOSIS — M546 Pain in thoracic spine: Secondary | ICD-10-CM

## 2021-04-21 NOTE — Therapy (Signed)
[name], [place], [address] Phone: [phone]   Fax:  [phone]  Physical Therapy Treatment  Patient Details  Name: Morgan [name] MRN: [id number] Date of Birth: [birth year] Referring Provider (PT): Laroy Apple MD   Encounter Date: 04/21/2021   PT End of Session - 04/21/21 0809    Visit Number 7    Number of Visits 12    Date for PT Re-Evaluation 05/14/21    Authorization Type FOTO.    PT Start Time 0730    PT Stop Time 0825    PT Time Calculation (min) 55 min    Activity Tolerance Patient tolerated treatment well    Behavior During Therapy [facility] for tasks assessed/performed           Past Medical History:  Diagnosis Date  . Allergic rhinitis   . Anemia     after gastric bypass in 2018  . Anxiety   . Barrett's esophagus   . Bipolar affective disorder ([place])   . CAP (community acquired pneumonia) 07/20/2018  . Chronic respiratory failure ([place])   . Constipation, chronic   . Degenerative joint disease of spine   . Depression   . Diabetes mellitus without complication ([place])    type 2   . DM (diabetes mellitus) ([place])    "hypoglycemic" per pt - no longer takes DM meds due to weight loss from gastric bypass in 2018  . Dry eye   . Family history of adverse reaction to anesthesia    nausea and vomiting  . Gastroparesis   . GERD (gastroesophageal reflux disease)   . H/O shoulder replacement 08/11/2020   left shoulder  . History of bariatric surgery 03/2017  . History of hiatal hernia   . HLD (hyperlipidemia) 03/12/2013  . Hyperlipidemia   . Hypertension    No HTN meds since weight loss from Gastric Bypass in 2018  . Intractable chronic migraine without aura 06/04/2015  . Migraine headache   . Morbid obesity ([place])   . OSA (obstructive sleep apnea)    No cpap since gastric surgery  . Osteoarthritis    bilateral knee  . SBO (small bowel obstruction) ([place]) 03/19/2019  . Sleep apnea   . Unspecified  hypothyroidism   . Vitamin B 12 deficiency   . Vitamin D deficiency     Past Surgical History:  Procedure Laterality Date  . ANKLE ARTHROSCOPY WITH RECONSTRUCTION Right 09/24/2019   Procedure: ANKLE ARTHROSCOPY DEBRIDEMENT TREATMENT OF OSTEOCHONDRAL LESION TALUS. PRONEAL TENDON DEBRIDEMENT;  Surgeon: Erle Crocker, MD;  Location: [facility];  Service: Orthopedics;  Laterality: Right;  SURGERY REQUEST TIME 2 HOURS  . APPENDECTOMY  1978  . CHOLECYSTECTOMY  2005  . COLONOSCOPY    . disc repair  05/17/2019   with rods, lumbar spine  . GASTRIC ROUX-EN-Y N/A 03/21/2017   Procedure: LAPAROSCOPIC ROUX-EN-Y GASTRIC, UPPER ENDO;  Surgeon: Greer Pickerel, MD;  [facility];  Service: General;  Laterality: N/A;  . GASTROSTOMY N/A 06/19/2018   Procedure: LAPRASCOPIC INSERTION OF GASTROSTOMY TUBE;  Surgeon: Greer Pickerel, MD;  [facility];  Service: General;  Laterality: N/A;  . GASTROSTOMY TUBE PLACEMENT Left    06/2018  . HERNIA REPAIR    . HIATAL HERNIA REPAIR N/A 06/19/2018   Procedure: LAPAROSCOPIC REPAIR OF HIATAL HERNIA;  Surgeon: Greer Pickerel, MD;  [facility];  Service: General;  Laterality: N/A;  . LAPAROSCOPIC LYSIS OF ADHESIONS  03/19/2019   Dr. Romana Juniper  .  LAPAROSCOPY N/A 06/19/2018   Procedure: LAPAROSCOPY DIAGNOSTIC;  Surgeon: Greer Pickerel, MD;  [facility];  Service: General;  Laterality: N/A;  . LAPAROSCOPY N/A 03/19/2019   Procedure: LAPAROSCOPY DIAGNOSTIC lysis of adhesions;  Surgeon: Clovis Riley, MD;  [facility];  Service: General;  Laterality: N/A;  . LUMBAR LAMINECTOMY/DECOMPRESSION MICRODISCECTOMY Right 10/11/2018   Procedure: LAMINECTOMY AND FORAMINOTOMY RIGHT LUMBAR FOUR- LUMBAR FIVE;  Surgeon: Consuella Lose, MD;  Location: [facility];  Service: Neurosurgery;  Laterality: Right;  . SHOULDER ARTHROSCOPY  7/12   left-dsc  . TONSILLECTOMY  at age 24  . TOTAL SHOULDER ARTHROPLASTY Left 08/11/2020   Procedure: TOTAL SHOULDER  ARTHROPLASTY;  Surgeon: Marchia Bond, MD;  Location: [facility];  Service: Orthopedics;  Laterality: Left;  . TRIGGER FINGER RELEASE  12/20/2012   Procedure: RELEASE TRIGGER FINGER/A-1 PULLEY;  Surgeon: Tennis Must, MD;  Location: [facility];  Service: Orthopedics;  Laterality: Left;  LEFT TRIGGER THUMB RELEASE    There were no vitals filed for this visit.   Subjective Assessment - 04/21/21 0809    Subjective COVID-19 screen performed prior to patient entering clinic.  Feel good but pain comes back.    Pertinent History DM, Bi-polar, DJD, DM, ankle surgery, OA., GERD, hypothyroidism, left TSA, lumbar surgery, trigger finger release.    Patient Stated Goals Get rid of pain.    Pain Score 7     Pain Location Thoracic    Pain Orientation Right;Left    Pain Descriptors / Indicators Aching;Sharp    Pain Type Acute pain    Pain Onset More than a month ago                             [name] Adult PT Treatment/Exercise - 04/21/21 0001      Modalities   Modalities Electrical Stimulation;Moist Heat;Ultrasound      Moist Heat Therapy   Number Minutes Moist Heat 20 Minutes    Moist Heat Location --   C-T region.     Proofreader Action IFC    Electrical Stimulation Parameters 80-150 Hz on 40% scan x 20 minutes.    Electrical Stimulation Goals Pain      Ultrasound   Ultrasound Location C-T region.    Ultrasound Parameters Cmbo e'stim/US at 1.50 W/CM2 x 12 minutes.      Manual Therapy   Manual Therapy Soft tissue mobilization    Soft tissue mobilization STW/M x 11 minutes with gentle PA mobs to upper thoracic region.                       PT Long Term Goals - 04/02/21 1037      PT LONG TERM GOAL #1   Title Independent with a HEP and progression.    Time 6    Period Weeks    Status New      PT LONG TERM GOAL #2   Title Increase  active cervical rotation to 70 degrees+ so patient can turn head more easily while driving.    Time 6    Period Weeks    Status New      PT LONG TERM GOAL #3   Title Perform ADL's with pain not > 2-3/10.    Time 6    Period Weeks    Status New  Plan - 04/21/21 3154    Clinical Impression Statement Patient feels good after treatment but pain returns.  She has an MD appointment on 05/11/21 but will try to get in [facility].    Personal Factors and Comorbidities Comorbidity 1;Comorbidity 3+;Comorbidity 2    Comorbidities DM, Bi-polar, DJD, DM, ankle surgery.    Examination-Activity Limitations Other    Examination-Participation Restrictions Other    Rehab Potential Excellent    PT Duration 6 weeks    PT Treatment/Interventions ADLs/Self Care Home Management;Cryotherapy;Electrical Stimulation;Ultrasound;Traction;Moist Heat;Therapeutic activities;Therapeutic exercise;Manual techniques;Patient/family education;Joint Manipulations;Spinal Manipulations    PT Next Visit Plan PA and costo-vertebral mobs, chin tucks and extension, combo e'stim/US, HMP/E'stim.    Consulted and Agree with Plan of Care Patient           Patient will benefit from skilled therapeutic intervention in order to improve the following deficits and impairments:  Pain,Decreased activity tolerance,Decreased range of motion,Increased edema  Visit Diagnosis: Cervicalgia  Pain in thoracic spine  Abnormal posture     Problem List Patient Active Problem List   Diagnosis Date Noted  . Osteoarthritis of left shoulder 08/11/2020  . S/P shoulder replacement, left 08/11/2020  . AMS (altered mental status) 05/19/2019  . Thrombocytopenia ([place]) 05/19/2019  . SBO (small bowel obstruction) s/p lap LOA 03/19/2019 03/19/2019  . Chronic migraine without aura, with intractable migraine, so stated, with status migrainosus 11/20/2018  . Lumbar radiculopathy 10/11/2018  . Atelectasis   . Hypoglycemia   .  Hypotension   . Bradycardia   . Epigastric pain 06/19/2018  . History of adenomatous polyp of colon 10/23/2017  . Hyperlipidemia associated with type 2 diabetes mellitus ([place]) 07/06/2017  . GERD (gastroesophageal reflux disease) 03/21/2017  . History of Roux-en-Y gastric bypass 2018 03/21/2017  . Morbid obesity with BMI of 40.0-44.9, adult ([place]) 06/06/2016  . Intractable chronic migraine without aura 06/04/2015  . Abnormal uterine bleeding 05/13/2015  . Pancreatitis 02/27/2014  . Fatty liver disease, nonalcoholic 00/86/7619  . Bipolar disorder ([place]) 02/20/2014  . Metabolic syndrome 50/93/2671  . Vitamin D deficiency   . DM (diabetes mellitus) ([place]) 03/12/2013  . Surgery, elective 06/12/2011  . Hypothyroidism   . Constipation, chronic   . Vitamin B 12 deficiency   . ALLERGIC RHINITIS 12/31/2010  . BARRETTS ESOPHAGUS 12/31/2010  . Gastroparesis 12/31/2010  . Bilateral chronic knee pain 12/31/2010  . Obstructive sleep apnea 09/27/2010  . Migraine 09/27/2010  . RESPIRATORY FAILURE, CHRONIC 09/27/2010  . Cyst of ovary 10/19/2009    Kyilee Gregg, [place] MPT 04/21/2021, 8:58 AM  [facility] [place] [address] [place], [place], [address] Phone: [phone]   Fax:  [phone]  Name: TIFFANE SHELDON MRN: [id number] Date of Birth: 1966-10-18

## 2021-04-26 ENCOUNTER — Other Ambulatory Visit: Payer: Self-pay

## 2021-04-26 ENCOUNTER — Ambulatory Visit: Payer: Medicare Other | Admitting: Physical Therapy

## 2021-04-26 DIAGNOSIS — R293 Abnormal posture: Secondary | ICD-10-CM | POA: Diagnosis not present

## 2021-04-26 DIAGNOSIS — M542 Cervicalgia: Secondary | ICD-10-CM

## 2021-04-26 DIAGNOSIS — M791 Myalgia, unspecified site: Secondary | ICD-10-CM | POA: Diagnosis not present

## 2021-04-26 DIAGNOSIS — M546 Pain in thoracic spine: Secondary | ICD-10-CM

## 2021-04-26 NOTE — Therapy (Signed)
[facility] [place], [place], [address] Phone: [phone]   Fax:  [phone]  Physical Therapy Treatment  Patient Details  Name: Morgan [name] MRN: [id number] Date of Birth: [birth year] Referring Provider (PT): Laroy Apple MD   Encounter Date: 04/26/2021   PT End of Session - 04/26/21 0859    Visit Number 8    Number of Visits 12    Date for PT Re-Evaluation 05/14/21    Authorization Type FOTO.    PT Start Time 0732    PT Stop Time 0827    PT Time Calculation (min) 55 min    Activity Tolerance Patient tolerated treatment well    Behavior During Therapy WFL for tasks assessed/performed           Past Medical History:  Diagnosis Date  . Allergic rhinitis   . Anemia     after gastric bypass in 2018  . Anxiety   . Barrett's esophagus   . Bipolar affective disorder ([place])   . CAP (community acquired pneumonia) 07/20/2018  . Chronic respiratory failure ([place])   . Constipation, chronic   . Degenerative joint disease of spine   . Depression   . Diabetes mellitus without complication ([place])    type 2   . DM (diabetes mellitus) ([place])    "hypoglycemic" per pt - no longer takes DM meds due to weight loss from gastric bypass in 2018  . Dry eye   . Family history of adverse reaction to anesthesia    nausea and vomiting  . Gastroparesis   . GERD (gastroesophageal reflux disease)   . H/O shoulder replacement 08/11/2020   left shoulder  . History of bariatric surgery 03/2017  . History of hiatal hernia   . HLD (hyperlipidemia) 03/12/2013  . Hyperlipidemia   . Hypertension    No HTN meds since weight loss from Gastric Bypass in 2018  . Intractable chronic migraine without aura 06/04/2015  . Migraine headache   . Morbid obesity ([place])   . OSA (obstructive sleep apnea)    No cpap since gastric surgery  . Osteoarthritis    bilateral knee  . SBO (small bowel obstruction) ([place]) 03/19/2019  . Sleep apnea   . Unspecified  hypothyroidism   . Vitamin B 12 deficiency   . Vitamin D deficiency     Past Surgical History:  Procedure Laterality Date  . ANKLE ARTHROSCOPY WITH RECONSTRUCTION Right 09/24/2019   Procedure: ANKLE ARTHROSCOPY DEBRIDEMENT TREATMENT OF OSTEOCHONDRAL LESION TALUS. PRONEAL TENDON DEBRIDEMENT;  Surgeon: Erle Crocker, MD;  Location: [place];  Service: Orthopedics;  Laterality: Right;  SURGERY REQUEST TIME 2 HOURS  . APPENDECTOMY  1978  . CHOLECYSTECTOMY  2005  . COLONOSCOPY    . disc repair  05/17/2019   with rods, lumbar spine  . GASTRIC ROUX-EN-Y N/A 03/21/2017   Procedure: LAPAROSCOPIC ROUX-EN-Y GASTRIC, UPPER ENDO;  Surgeon: Greer Pickerel, MD;  Location[facility];  Service: General;  Laterality: N/A;  . GASTROSTOMY N/A 06/19/2018   Procedure: LAPRASCOPIC INSERTION OF GASTROSTOMY TUBE;  Surgeon: Greer Pickerel, MD;  Location[facility];  Service: General;  Laterality: N/A;  . GASTROSTOMY TUBE PLACEMENT Left    06/2018  . HERNIA REPAIR    . HIATAL HERNIA REPAIR N/A 06/19/2018   Procedure: LAPAROSCOPIC REPAIR OF HIATAL HERNIA;  Surgeon: Greer Pickerel, MD;  [facility];  Service: General;  Laterality: N/A;  . LAPAROSCOPIC LYSIS OF ADHESIONS  03/19/2019   Dr. Romana Juniper  .  LAPAROSCOPY N/A 06/19/2018   Procedure: LAPAROSCOPY DIAGNOSTIC;  Surgeon: Greer Pickerel, MD;  Location[facility];  Service: General;  Laterality: N/A;  . LAPAROSCOPY N/A 03/19/2019   Procedure: LAPAROSCOPY DIAGNOSTIC lysis of adhesions;  Surgeon: Clovis Riley, MD;  Location[facility];  Service: General;  Laterality: N/A;  . LUMBAR LAMINECTOMY/DECOMPRESSION MICRODISCECTOMY Right 10/11/2018   Procedure: LAMINECTOMY AND FORAMINOTOMY RIGHT LUMBAR FOUR- LUMBAR FIVE;  Surgeon: Consuella Lose, MD;  Location: [place];  Service: Neurosurgery;  Laterality: Right;  . SHOULDER ARTHROSCOPY  7/12   left-dsc  . TONSILLECTOMY  at age 29  . TOTAL SHOULDER ARTHROPLASTY Left 08/11/2020   Procedure: TOTAL SHOULDER  ARTHROPLASTY;  Surgeon: Marchia Bond, MD;  Location: [place];  Service: Orthopedics;  Laterality: Left;  . TRIGGER FINGER RELEASE  12/20/2012   Procedure: RELEASE TRIGGER FINGER/A-1 PULLEY;  Surgeon: Tennis Must, MD;  Location: [place];  Service: Orthopedics;  Laterality: Left;  LEFT TRIGGER THUMB RELEASE    There were no vitals filed for this visit.   Subjective Assessment - 04/26/21 0900    Subjective COVID-19 screen performed prior to patient entering clinic.  Pain about a 5.    Pertinent History DM, Bi-polar, DJD, DM, ankle surgery, OA., GERD, hypothyroidism, left TSA, lumbar surgery, trigger finger release.    Patient Stated Goals Get rid of pain.    Currently in Pain? Yes    Pain Score 5     Pain Location Back    Pain Orientation Right;Left    Pain Descriptors / Indicators Aching;Sharp    Pain Type Acute pain    Pain Onset More than a month ago                             [facility] Adult PT Treatment/Exercise - 04/26/21 0001      Modalities   Modalities Electrical Stimulation;Moist Heat;Ultrasound      Moist Heat Therapy   Number Minutes Moist Heat 20 Minutes    Moist Heat Location --   C-T spine.     Acupuncturist Location Upper thoracic.    Electrical Stimulation Action IFC at 80-150 Hz.    Electrical Stimulation Parameters 40% scan x 20 minutes.      Ultrasound   Ultrasound Location C-T region    Ultrasound Parameters [place] at 1.50 W/CM2 x 12 minutes.      Manual Therapy   Manual Therapy Soft tissue mobilization    Soft tissue mobilization STW/M x 11 minutes to bilateral UT's and C-T junction.                       PT Long Term Goals - 04/02/21 1037      PT LONG TERM GOAL #1   Title Independent with a HEP and progression.    Time 6    Period Weeks    Status New      PT LONG TERM GOAL #2   Title Increase active cervical rotation to 70 degrees+ so patient can  turn head more easily while driving.    Time 6    Period Weeks    Status New      PT LONG TERM GOAL #3   Title Perform ADL's with pain not > 2-3/10.    Time 6    Period Weeks    Status New  Patient will benefit from skilled therapeutic intervention in order to improve the following deficits and impairments:     Visit Diagnosis: Cervicalgia  Pain in thoracic spine  Abnormal posture     Problem List Patient Active Problem List   Diagnosis Date Noted  . Osteoarthritis of left shoulder 08/11/2020  . S/P shoulder replacement, left 08/11/2020  . AMS (altered mental status) 05/19/2019  . Thrombocytopenia ([employer]) 05/19/2019  . SBO (small bowel obstruction) s/p lap LOA 03/19/2019 03/19/2019  . Chronic migraine without aura, with intractable migraine, so stated, with status migrainosus 11/20/2018  . Lumbar radiculopathy 10/11/2018  . Atelectasis   . Hypoglycemia   . Hypotension   . Bradycardia   . Epigastric pain 06/19/2018  . History of adenomatous polyp of colon 10/23/2017  . Hyperlipidemia associated with type 2 diabetes mellitus ([place]) 07/06/2017  . GERD (gastroesophageal reflux disease) 03/21/2017  . History of Roux-en-Y gastric bypass 2018 03/21/2017  . Morbid obesity with BMI of 40.0-44.9, adult ([place]) 06/06/2016  . Intractable chronic migraine without aura 06/04/2015  . Abnormal uterine bleeding 05/13/2015  . Pancreatitis 02/27/2014  . Fatty liver disease, nonalcoholic 16/94/5038  . Bipolar disorder ([place]) 02/20/2014  . Metabolic syndrome 88/28/0034  . Vitamin D deficiency   . DM (diabetes mellitus) ([place]) 03/12/2013  . Surgery, elective 06/12/2011  . Hypothyroidism   . Constipation, chronic   . Vitamin B 12 deficiency   . ALLERGIC RHINITIS 12/31/2010  . BARRETTS ESOPHAGUS 12/31/2010  . Gastroparesis 12/31/2010  . Bilateral chronic knee pain 12/31/2010  . Obstructive sleep apnea 09/27/2010  . Migraine 09/27/2010  . RESPIRATORY FAILURE,  CHRONIC 09/27/2010  . Cyst of ovary 10/19/2009    Merridy Pascoe, [place] MPT 04/26/2021, 9:26 AM  [facility] [address] [place], [place], [address] Phone: [phone]   Fax:  [phone]  Name: RYONNA CIMINI MRN: [id number] Date of Birth: [birth year]

## 2021-04-27 ENCOUNTER — Other Ambulatory Visit: Payer: Self-pay | Admitting: Family Medicine

## 2021-04-27 DIAGNOSIS — K219 Gastro-esophageal reflux disease without esophagitis: Secondary | ICD-10-CM

## 2021-04-30 ENCOUNTER — Other Ambulatory Visit: Payer: Self-pay

## 2021-04-30 ENCOUNTER — Ambulatory Visit: Payer: Medicare Other | Admitting: Physical Therapy

## 2021-04-30 DIAGNOSIS — R293 Abnormal posture: Secondary | ICD-10-CM | POA: Diagnosis not present

## 2021-04-30 DIAGNOSIS — M546 Pain in thoracic spine: Secondary | ICD-10-CM

## 2021-04-30 DIAGNOSIS — M542 Cervicalgia: Secondary | ICD-10-CM

## 2021-04-30 NOTE — Therapy (Signed)
[facility] [place], [place], [address] Phone: [phone]   Fax:  [phone]  Physical Therapy Treatment  Patient Details  Name: Morgan [name] MRN: [id number] Date of Birth: 09-08-1966 Referring Provider (PT): Laroy Apple MD   Encounter Date: 04/30/2021   PT End of Session - 04/30/21 1231    Visit Number 9    Number of Visits 12    Date for PT Re-Evaluation 05/14/21    Authorization Type FOTO.    PT Start Time 0900    PT Stop Time 0954    PT Time Calculation (min) 54 min    Activity Tolerance Patient tolerated treatment well           Past Medical History:  Diagnosis Date  . Allergic rhinitis   . Anemia     after gastric bypass in 2018  . Anxiety   . Barrett's esophagus   . Bipolar affective disorder ([place])   . CAP (community acquired pneumonia) 07/20/2018  . Chronic respiratory failure ([place])   . Constipation, chronic   . Degenerative joint disease of spine   . Depression   . Diabetes mellitus without complication ([place])    type 2   . DM (diabetes mellitus) ([place])    "hypoglycemic" per pt - no longer takes DM meds due to weight loss from gastric bypass in 2018  . Dry eye   . Family history of adverse reaction to anesthesia    nausea and vomiting  . Gastroparesis   . GERD (gastroesophageal reflux disease)   . H/O shoulder replacement 08/11/2020   left shoulder  . History of bariatric surgery 03/2017  . History of hiatal hernia   . HLD (hyperlipidemia) 03/12/2013  . Hyperlipidemia   . Hypertension    No HTN meds since weight loss from Gastric Bypass in 2018  . Intractable chronic migraine without aura 06/04/2015  . Migraine headache   . Morbid obesity ([place])   . OSA (obstructive sleep apnea)    No cpap since gastric surgery  . Osteoarthritis    bilateral knee  . SBO (small bowel obstruction) ([place]) 03/19/2019  . Sleep apnea   . Unspecified hypothyroidism   . Vitamin B 12 deficiency   . Vitamin D  deficiency     Past Surgical History:  Procedure Laterality Date  . ANKLE ARTHROSCOPY WITH RECONSTRUCTION Right 09/24/2019   Procedure: ANKLE ARTHROSCOPY DEBRIDEMENT TREATMENT OF OSTEOCHONDRAL LESION TALUS. PRONEAL TENDON DEBRIDEMENT;  Surgeon: Erle Crocker, MD;  Location: [place];  Service: Orthopedics;  Laterality: Right;  SURGERY REQUEST TIME 2 HOURS  . APPENDECTOMY  1978  . CHOLECYSTECTOMY  2005  . COLONOSCOPY    . disc repair  05/17/2019   with rods, lumbar spine  . GASTRIC ROUX-EN-Y N/A 03/21/2017   Procedure: LAPAROSCOPIC ROUX-EN-Y GASTRIC, UPPER ENDO;  Surgeon: Greer Pickerel, MD;  [facility];  Service: General;  Laterality: N/A;  . GASTROSTOMY N/A 06/19/2018   Procedure: LAPRASCOPIC INSERTION OF GASTROSTOMY TUBE;  Surgeon: Greer Pickerel, MD;  [facility];  Service: General;  Laterality: N/A;  . GASTROSTOMY TUBE PLACEMENT Left    06/2018  . HERNIA REPAIR    . HIATAL HERNIA REPAIR N/A 06/19/2018   Procedure: LAPAROSCOPIC REPAIR OF HIATAL HERNIA;  Surgeon: Greer Pickerel, MD;  [facility];  Service: General;  Laterality: N/A;  . LAPAROSCOPIC LYSIS OF ADHESIONS  03/19/2019   Dr. Romana Juniper  . LAPAROSCOPY N/A 06/19/2018   Procedure: LAPAROSCOPY DIAGNOSTIC;  Surgeon: Greer Pickerel, MD;  [facility];  Service: General;  Laterality: N/A;  . LAPAROSCOPY N/A 03/19/2019   Procedure: LAPAROSCOPY DIAGNOSTIC lysis of adhesions;  Surgeon: Clovis Riley, MD;  [facility];  Service: General;  Laterality: N/A;  . LUMBAR LAMINECTOMY/DECOMPRESSION MICRODISCECTOMY Right 10/11/2018   Procedure: LAMINECTOMY AND FORAMINOTOMY RIGHT LUMBAR FOUR- LUMBAR FIVE;  Surgeon: Consuella Lose, MD;  Location: [place];  Service: Neurosurgery;  Laterality: Right;  . SHOULDER ARTHROSCOPY  7/12   left-dsc  . TONSILLECTOMY  at age 62  . TOTAL SHOULDER ARTHROPLASTY Left 08/11/2020   Procedure: TOTAL SHOULDER ARTHROPLASTY;  Surgeon: Marchia Bond, MD;  Location: [place];  Service: Orthopedics;  Laterality: Left;  . TRIGGER FINGER RELEASE  12/20/2012   Procedure: RELEASE TRIGGER FINGER/A-1 PULLEY;  Surgeon: Tennis Must, MD;  Location: [place];  Service: Orthopedics;  Laterality: Left;  LEFT TRIGGER THUMB RELEASE    There were no vitals filed for this visit.   Subjective Assessment - 04/30/21 1232    Subjective COVID-19 screen performed prior to patient entering clinic.  Got injections on Monday.  Pain at a 4 today.    Pertinent History DM, Bi-polar, DJD, DM, ankle surgery, OA., GERD, hypothyroidism, left TSA, lumbar surgery, trigger finger release.    Patient Stated Goals Get rid of pain.    Currently in Pain? Yes    Pain Score 5     Pain Location Back    Pain Orientation Right;Left    Pain Descriptors / Indicators Aching;Sharp    Pain Type Acute pain    Pain Onset More than a month ago                             [facility] Adult PT Treatment/Exercise - 04/30/21 0001      Electrical Stimulation   Electrical Stimulation Location Upper thoracic.    Electrical Stimulation Action IFC at 80-150 Hz x 20 minutes.      Manual Therapy   Manual Therapy Soft tissue mobilization    Soft tissue mobilization In prone:  STW/M to affected C-T region x 23 minutes with gentle PA thoracic and costovertebral mobs.                       PT Long Term Goals - 04/02/21 1037      PT LONG TERM GOAL #1   Title Independent with a HEP and progression.    Time 6    Period Weeks    Status New      PT LONG TERM GOAL #2   Title Increase active cervical rotation to 70 degrees+ so patient can turn head more easily while driving.    Time 6    Period Weeks    Status New      PT LONG TERM GOAL #3   Title Perform ADL's with pain not > 2-3/10.    Time 6    Period Weeks    Status New                 Plan - 04/30/21 0912    Clinical Impression Statement Patient had a good response to her injection  earlier this week.  Most of her pain was located in her left upper thoracic region today.  She felt better after treatment.    Personal Factors and Comorbidities Comorbidity 1;Comorbidity 3+;Comorbidity 2    Comorbidities DM, Bi-polar,  DJD, DM, ankle surgery.    Examination-Activity Limitations Other    Examination-Participation Restrictions Other    Stability/Clinical Decision Making Stable/Uncomplicated    Rehab Potential Excellent    PT Frequency 2x / week    PT Duration 6 weeks    PT Treatment/Interventions ADLs/Self Care Home Management;Cryotherapy;Electrical Stimulation;Ultrasound;Traction;Moist Heat;Therapeutic activities;Therapeutic exercise;Manual techniques;Patient/family education;Joint Manipulations;Spinal Manipulations    PT Next Visit Plan PA and costo-vertebral mobs, chin tucks and extension, combo e'stim/US, HMP/E'stim.    Consulted and Agree with Plan of Care Patient           Patient will benefit from skilled therapeutic intervention in order to improve the following deficits and impairments:  Pain,Decreased activity tolerance,Decreased range of motion,Increased edema  Visit Diagnosis: Cervicalgia  Pain in thoracic spine  Abnormal posture     Problem List Patient Active Problem List   Diagnosis Date Noted  . Osteoarthritis of left shoulder 08/11/2020  . S/P shoulder replacement, left 08/11/2020  . AMS (altered mental status) 05/19/2019  . Thrombocytopenia ([place]) 05/19/2019  . SBO (small bowel obstruction) s/p lap LOA 03/19/2019 03/19/2019  . Chronic migraine without aura, with intractable migraine, so stated, with status migrainosus 11/20/2018  . Lumbar radiculopathy 10/11/2018  . Atelectasis   . Hypoglycemia   . Hypotension   . Bradycardia   . Epigastric pain 06/19/2018  . History of adenomatous polyp of colon 10/23/2017  . Hyperlipidemia associated with type 2 diabetes mellitus ([place]) 07/06/2017  . GERD (gastroesophageal reflux disease) 03/21/2017  .  History of Roux-en-Y gastric bypass 2018 03/21/2017  . Morbid obesity with BMI of 40.0-44.9, adult ([place]) 06/06/2016  . Intractable chronic migraine without aura 06/04/2015  . Abnormal uterine bleeding 05/13/2015  . Pancreatitis 02/27/2014  . Fatty liver disease, nonalcoholic 54/62/7035  . Bipolar disorder ([place]) 02/20/2014  . Metabolic syndrome 00/93/8182  . Vitamin D deficiency   . DM (diabetes mellitus) ([place]) 03/12/2013  . Surgery, elective 06/12/2011  . Hypothyroidism   . Constipation, chronic   . Vitamin B 12 deficiency   . ALLERGIC RHINITIS 12/31/2010  . BARRETTS ESOPHAGUS 12/31/2010  . Gastroparesis 12/31/2010  . Bilateral chronic knee pain 12/31/2010  . Obstructive sleep apnea 09/27/2010  . Migraine 09/27/2010  . RESPIRATORY FAILURE, CHRONIC 09/27/2010  . Cyst of ovary 10/19/2009    Fadi Menter, [place] MPT 04/30/2021, 12:48 PM  [facility] [address] [place], [place], [address] Phone: [phone]   Fax:  [phone]  Name: Morgan [name] MRN: [id number] Date of Birth: 19-Sep-1966

## 2021-05-03 ENCOUNTER — Other Ambulatory Visit: Payer: Self-pay

## 2021-05-03 ENCOUNTER — Ambulatory Visit: Payer: Medicare Other | Admitting: Physical Therapy

## 2021-05-03 DIAGNOSIS — M542 Cervicalgia: Secondary | ICD-10-CM | POA: Diagnosis not present

## 2021-05-03 DIAGNOSIS — M546 Pain in thoracic spine: Secondary | ICD-10-CM

## 2021-05-03 DIAGNOSIS — R293 Abnormal posture: Secondary | ICD-10-CM | POA: Diagnosis not present

## 2021-05-03 NOTE — Therapy (Signed)
[facility] [place], [place], [address] Phone: [phone]   Fax:  [phone]  Physical Therapy Treatment  Patient Details  Name: Morgan [name] MRN: [id number] Date of Birth: 08/18/1966 Referring Provider (PT): Laroy Apple MD   Encounter Date: 05/03/2021   PT End of Session - 05/03/21 0928    Visit Number 10    Number of Visits 12    Date for PT Re-Evaluation 05/14/21    Authorization Type FOTO.    PT Start Time 0735    PT Stop Time 0812    PT Time Calculation (min) 37 min    Activity Tolerance Patient tolerated treatment well           Past Medical History:  Diagnosis Date  . Allergic rhinitis   . Anemia     after gastric bypass in 2018  . Anxiety   . Barrett's esophagus   . Bipolar affective disorder ([place])   . CAP (community acquired pneumonia) 07/20/2018  . Chronic respiratory failure ([place])   . Constipation, chronic   . Degenerative joint disease of spine   . Depression   . Diabetes mellitus without complication ([place])    type 2   . DM (diabetes mellitus) ([place])    "hypoglycemic" per pt - no longer takes DM meds due to weight loss from gastric bypass in 2018  . Dry eye   . Family history of adverse reaction to anesthesia    nausea and vomiting  . Gastroparesis   . GERD (gastroesophageal reflux disease)   . H/O shoulder replacement 08/11/2020   left shoulder  . History of bariatric surgery 03/2017  . History of hiatal hernia   . HLD (hyperlipidemia) 03/12/2013  . Hyperlipidemia   . Hypertension    No HTN meds since weight loss from Gastric Bypass in 2018  . Intractable chronic migraine without aura 06/04/2015  . Migraine headache   . Morbid obesity ([place])   . OSA (obstructive sleep apnea)    No cpap since gastric surgery  . Osteoarthritis    bilateral knee  . SBO (small bowel obstruction) ([place]) 03/19/2019  . Sleep apnea   . Unspecified hypothyroidism   . Vitamin B 12 deficiency   . Vitamin D  deficiency     Past Surgical History:  Procedure Laterality Date  . ANKLE ARTHROSCOPY WITH RECONSTRUCTION Right 09/24/2019   Procedure: ANKLE ARTHROSCOPY DEBRIDEMENT TREATMENT OF OSTEOCHONDRAL LESION TALUS. PRONEAL TENDON DEBRIDEMENT;  Surgeon: Erle Crocker, MD;  Location: [facility];  Service: Orthopedics;  Laterality: Right;  SURGERY REQUEST TIME 2 HOURS  . APPENDECTOMY  1978  . CHOLECYSTECTOMY  2005  . COLONOSCOPY    . disc repair  05/17/2019   with rods, lumbar spine  . GASTRIC ROUX-EN-Y N/A 03/21/2017   Procedure: LAPAROSCOPIC ROUX-EN-Y GASTRIC, UPPER ENDO;  Surgeon: Greer Pickerel, MD;  Location: [facility];  Service: General;  Laterality: N/A;  . GASTROSTOMY N/A 06/19/2018   Procedure: LAPRASCOPIC INSERTION OF GASTROSTOMY TUBE;  Surgeon: Greer Pickerel, MD;  Location: [facility];  Service: General;  Laterality: N/A;  . GASTROSTOMY TUBE PLACEMENT Left    06/2018  . HERNIA REPAIR    . HIATAL HERNIA REPAIR N/A 06/19/2018   Procedure: LAPAROSCOPIC REPAIR OF HIATAL HERNIA;  Surgeon: Greer Pickerel, MD;  Location[facility];  Service: General;  Laterality: N/A;  . LAPAROSCOPIC LYSIS OF ADHESIONS  03/19/2019   Dr. Romana Juniper  . LAPAROSCOPY N/A 06/19/2018   Procedure: LAPAROSCOPY DIAGNOSTIC;  Surgeon: Greer Pickerel, MD;  Location: [facility];  Service: General;  Laterality: N/A;  . LAPAROSCOPY N/A 03/19/2019   Procedure: LAPAROSCOPY DIAGNOSTIC lysis of adhesions;  Surgeon: Clovis Riley, MD;  Location: [facility];  Service: General;  Laterality: N/A;  . LUMBAR LAMINECTOMY/DECOMPRESSION MICRODISCECTOMY Right 10/11/2018   Procedure: LAMINECTOMY AND FORAMINOTOMY RIGHT LUMBAR FOUR- LUMBAR FIVE;  Surgeon: Consuella Lose, MD;  Location: [facility];  Service: Neurosurgery;  Laterality: Right;  . SHOULDER ARTHROSCOPY  7/12   left-dsc  . TONSILLECTOMY  at age 60  . TOTAL SHOULDER ARTHROPLASTY Left 08/11/2020   Procedure: TOTAL SHOULDER ARTHROPLASTY;  Surgeon: Marchia Bond, MD;  Location: [facility];  Service: Orthopedics;  Laterality: Left;  . TRIGGER FINGER RELEASE  12/20/2012   Procedure: RELEASE TRIGGER FINGER/A-1 PULLEY;  Surgeon: Tennis Must, MD;  Location: [facility];  Service: Orthopedics;  Laterality: Left;  LEFT TRIGGER THUMB RELEASE    There were no vitals filed for this visit.   Subjective Assessment - 05/03/21 0929    Subjective COVID-19 screen performed prior to patient entering clinic.  Last day.  Been doing those bands exercises.    Pertinent History DM, Bi-polar, DJD, DM, ankle surgery, OA., GERD, hypothyroidism, left TSA, lumbar surgery, trigger finger release.    Patient Stated Goals Get rid of pain.    Currently in Pain? Yes    Pain Score 4     Pain Location Back    Pain Orientation Right;Left    Pain Descriptors / Indicators Aching;Sharp    Pain Type Acute pain    Pain Onset More than a month ago                             [facility] Adult PT Treatment/Exercise - 05/03/21 0001      Electrical Stimulation   Electrical Stimulation Location Upper thoracic    Electrical Stimulation Action IFC at 80-150 Hz x 20 minutes.      Manual Therapy   Manual Therapy Soft tissue mobilization    Soft tissue mobilization In prone:  STW/M x 10 minutes to affected C-T region which included PA mobs to upper thoracic and costovertebral joints.                       PT Long Term Goals - 05/03/21 0939      PT LONG TERM GOAL #1   Title Independent with a HEP and progression.    Time 6    Period Weeks    Status New      PT LONG TERM GOAL #2   Title Increase active cervical rotation to 70 degrees+ so patient can turn head more easily while driving.    Time 6    Period Weeks    Status On-going      PT LONG TERM GOAL #3   Title Perform ADL's with pain not > 2-3/10.    Baseline 4/10.    Time 6    Period Weeks    Status On-going                 Plan - 05/03/21 0933    Clinical Impression Statement The  patient has done well since her injection.  She reports continued pain in her left upper thoracic region.    Personal Factors and Comorbidities Comorbidity 1;Comorbidity 3+;Comorbidity 2    Comorbidities DM, Bi-polar, DJD, DM, ankle surgery.  Examination-Activity Limitations Other    Stability/Clinical Decision Making Stable/Uncomplicated    Rehab Potential Excellent    PT Frequency 2x / week    PT Duration 6 weeks    PT Treatment/Interventions ADLs/Self Care Home Management;Cryotherapy;Electrical Stimulation;Ultrasound;Traction;Moist Heat;Therapeutic activities;Therapeutic exercise;Manual techniques;Patient/family education;Joint Manipulations;Spinal Manipulations    PT Next Visit Plan D/c.    Consulted and Agree with Plan of Care Patient           Patient will benefit from skilled therapeutic intervention in order to improve the following deficits and impairments:  Pain,Decreased activity tolerance,Decreased range of motion,Increased edema  Visit Diagnosis: Cervicalgia  Pain in thoracic spine  Abnormal posture     Problem List Patient Active Problem List   Diagnosis Date Noted  . Osteoarthritis of left shoulder 08/11/2020  . S/P shoulder replacement, left 08/11/2020  . AMS (altered mental status) 05/19/2019  . Thrombocytopenia ([place]) 05/19/2019  . SBO (small bowel obstruction) s/p lap LOA 03/19/2019 03/19/2019  . Chronic migraine without aura, with intractable migraine, so stated, with status migrainosus 11/20/2018  . Lumbar radiculopathy 10/11/2018  . Atelectasis   . Hypoglycemia   . Hypotension   . Bradycardia   . Epigastric pain 06/19/2018  . History of adenomatous polyp of colon 10/23/2017  . Hyperlipidemia associated with type 2 diabetes mellitus ([place]) 07/06/2017  . GERD (gastroesophageal reflux disease) 03/21/2017  . History of Roux-en-Y gastric bypass 2018 03/21/2017  . Morbid obesity with BMI of 40.0-44.9, adult ([place]) 06/06/2016  . Intractable chronic migraine  without aura 06/04/2015  . Abnormal uterine bleeding 05/13/2015  . Pancreatitis 02/27/2014  . Fatty liver disease, nonalcoholic 78/67/6720  . Bipolar disorder ([place]) 02/20/2014  . Metabolic syndrome 94/70/9628  . Vitamin D deficiency   . DM (diabetes mellitus) ([place]) 03/12/2013  . Surgery, elective 06/12/2011  . Hypothyroidism   . Constipation, chronic   . Vitamin B 12 deficiency   . ALLERGIC RHINITIS 12/31/2010  . BARRETTS ESOPHAGUS 12/31/2010  . Gastroparesis 12/31/2010  . Bilateral chronic knee pain 12/31/2010  . Obstructive sleep apnea 09/27/2010  . Migraine 09/27/2010  . RESPIRATORY FAILURE, CHRONIC 09/27/2010  . Cyst of ovary 10/19/2009   PHYSICAL THERAPY DISCHARGE SUMMARY  Visits from Start of Care: 10.  Current functional level related to goals / functional outcomes: See above.    Remaining deficits: Some remaining left upper thoracic pain.   Education / Equipment: HEP. Plan: Patient agrees to discharge.  Patient goals were partially met. Patient is being discharged due to meeting the stated rehab goals.  ?????      Julianah Marciel, [place] MPT 05/03/2021, 9:41 AM  [facility] [address] [place], [place], [address] Phone: [phone]   Fax:  [phone]  Name: Morgan [name] MRN: [id number] Date of Birth: 08/01/1966

## 2021-05-05 ENCOUNTER — Encounter: Payer: Medicare Other | Admitting: Physical Therapy

## 2021-05-28 DIAGNOSIS — K5909 Other constipation: Secondary | ICD-10-CM | POA: Diagnosis not present

## 2021-05-28 DIAGNOSIS — Z9884 Bariatric surgery status: Secondary | ICD-10-CM | POA: Diagnosis not present

## 2021-06-25 ENCOUNTER — Other Ambulatory Visit: Payer: Self-pay | Admitting: Neurology

## 2021-06-28 ENCOUNTER — Other Ambulatory Visit: Payer: Self-pay | Admitting: Family Medicine

## 2021-06-29 ENCOUNTER — Other Ambulatory Visit: Payer: Self-pay

## 2021-06-29 MED ORDER — RIZATRIPTAN BENZOATE 10 MG PO TBDP: 10.0000 mg | ORAL_TABLET | ORAL | 2 refills | Status: DC | PRN

## 2021-06-30 DIAGNOSIS — H40033 Anatomical narrow angle, bilateral: Secondary | ICD-10-CM | POA: Diagnosis not present

## 2021-06-30 DIAGNOSIS — E119 Type 2 diabetes mellitus without complications: Secondary | ICD-10-CM | POA: Diagnosis not present

## 2021-07-20 ENCOUNTER — Other Ambulatory Visit: Payer: Self-pay | Admitting: Family Medicine

## 2021-07-27 ENCOUNTER — Ambulatory Visit (INDEPENDENT_AMBULATORY_CARE_PROVIDER_SITE_OTHER): Payer: Medicare Other | Admitting: Family Medicine

## 2021-07-27 ENCOUNTER — Encounter: Payer: Self-pay | Admitting: Family Medicine

## 2021-07-27 ENCOUNTER — Other Ambulatory Visit: Payer: Self-pay

## 2021-07-27 VITALS — BP 123/83 | HR 64 | Temp 97.5°F | Ht 64.0 in | Wt 175.0 lb

## 2021-07-27 DIAGNOSIS — I152 Hypertension secondary to endocrine disorders: Secondary | ICD-10-CM

## 2021-07-27 DIAGNOSIS — E1169 Type 2 diabetes mellitus with other specified complication: Secondary | ICD-10-CM | POA: Diagnosis not present

## 2021-07-27 DIAGNOSIS — Z9884 Bariatric surgery status: Secondary | ICD-10-CM

## 2021-07-27 DIAGNOSIS — E785 Hyperlipidemia, unspecified: Secondary | ICD-10-CM | POA: Diagnosis not present

## 2021-07-27 DIAGNOSIS — M67441 Ganglion, right hand: Secondary | ICD-10-CM

## 2021-07-27 DIAGNOSIS — E119 Type 2 diabetes mellitus without complications: Secondary | ICD-10-CM | POA: Diagnosis not present

## 2021-07-27 DIAGNOSIS — R5383 Other fatigue: Secondary | ICD-10-CM | POA: Diagnosis not present

## 2021-07-27 DIAGNOSIS — E1159 Type 2 diabetes mellitus with other circulatory complications: Secondary | ICD-10-CM

## 2021-07-27 DIAGNOSIS — R6889 Other general symptoms and signs: Secondary | ICD-10-CM | POA: Diagnosis not present

## 2021-07-27 LAB — BAYER DCA HB A1C WAIVED: HB A1C (BAYER DCA - WAIVED): 5.1 % (ref <7.0)

## 2021-07-27 NOTE — Patient Instructions (Signed)

## 2021-07-27 NOTE — Progress Notes (Signed)
Subjective: CC: DM PCP: Janora Norlander, DO DF:[id number] Morgan [name] is a 55 y.o. female presenting to clinic today for:  1. Type 2 Diabetes with hypertension, hyperlipidemia:  Blood sugars continue to run 70s to 90s with occasional lows in the 60s and only 2 lows in the 40s since her last visit.  She continues on metformin for sugar stabilization I would like her A1c sent to her endocrinologist today.  Last eye exam: Needs Last foot exam: Needs Last A1c:  Lab Results  Component Value Date   HGBA1C 5.5 03/24/2021   Nephropathy screen indicated?:  Up-to-date Last flu, zoster and/or pneumovax:  Immunization History  Administered Date(s) Administered   Influenza Inj Mdck Quad With Preservative 09/11/2017   Influenza Split 09/11/2020   Influenza,inj,Quad PF,6+ Mos 09/29/2016, 09/07/2017, 09/11/2018   Influenza,inj,quad, With Preservative 09/17/2019   Influenza-Unspecified 09/11/2016   Moderna Sars-Covid-2 Vaccination 03/02/2020, 03/30/2020, 10/14/2020   Pneumococcal Polysaccharide-23 06/29/2016   Td 07/09/2015   Tdap 07/09/2015   Zoster Recombinat (Shingrix) 10/06/2017, 01/04/2018    ROS: No chest pain, shortness of breath or loss of consciousness.  2.  Fatigue Patient reports some fatigue over the last several weeks.  She occasionally has some dizziness.  Her gastric bypass doctor wants her to have her iron checked.  Her last B12 and vitamin D were not low.  She would also like her thyroid rechecked.  Denies any change in bowel habits.  3.  Knot on finger Patient reports that she has had a knot near her pointer finger joint of the right hand for several weeks now.  She does admit to some intermittent numbness and tingling as well as discomfort in that area.  Previously saw Dr. Fredna Dow for other hand issues and she would like a referral back to him  ROS: Per HPI  Allergies  Allergen Reactions   Ketoconazole Hives and Swelling    SWELLING REACTION UNSPECIFIED    Pravachol  [Pravastatin Sodium] Shortness Of Breath, Swelling and Anaphylaxis    Throat swelling   Statins Other (See Comments)    Leg cramps   Tape Dermatitis and Other (See Comments)    Steri-Strips   Codeine Hypertension    increased BP   Past Medical History:  Diagnosis Date   Allergic rhinitis    Anemia     after gastric bypass in 2018   Anxiety    Barrett's esophagus    Bipolar affective disorder ([place])    CAP (community acquired pneumonia) 07/20/2018   Chronic respiratory failure ([place])    Constipation, chronic    Degenerative joint disease of spine    Depression    Diabetes mellitus without complication ([place])    type 2    DM (diabetes mellitus) ([place])    "hypoglycemic" per pt - no longer takes DM meds due to weight loss from gastric bypass in 2018   Dry eye    Family history of adverse reaction to anesthesia    nausea and vomiting   Gastroparesis    GERD (gastroesophageal reflux disease)    H/O shoulder replacement 08/11/2020   left shoulder   History of bariatric surgery 03/2017   History of hiatal hernia    HLD (hyperlipidemia) 03/12/2013   Hyperlipidemia    Hypertension    No HTN meds since weight loss from Gastric Bypass in 2018   Intractable chronic migraine without aura 06/04/2015   Migraine headache    Morbid obesity ([facility])    OSA (obstructive sleep apnea)  No cpap since gastric surgery   Osteoarthritis    bilateral knee   SBO (small bowel obstruction) ([place]) 03/19/2019   Sleep apnea    Unspecified hypothyroidism    Vitamin B 12 deficiency    Vitamin D deficiency     Current Outpatient Medications:    Calcium-Vitamin D-Vitamin K 650-12.5-40 MG-MCG-MCG CHEW, Chew 1 tablet by mouth 2 (two) times daily., Disp: , Rfl:    dicyclomine (BENTYL) 20 MG tablet, TAKE 1 TABLET EVERY 8 HOURS AS NEEDED FOR ABDOMINAL CRAMPING, Disp: 90 tablet, Rfl: 0   divalproex (DEPAKOTE) 500 MG DR tablet, Take 500 mg by mouth in the morning and 1000 mg at bedtime, Disp: 270 tablet, Rfl: 3    ezetimibe (ZETIA) 10 MG tablet, Take 1 tablet (10 mg total) by mouth daily., Disp: 90 tablet, Rfl: 3   fluticasone (FLONASE) 50 MCG/ACT nasal spray, USE 2 SPRAYS IN EACH NOSTRIL TWICE DAILY AS NEEDED FOR ALLERGY, Disp: 16 g, Rfl: 4   gabapentin (NEURONTIN) 300 MG capsule, Takes 2 capsules in morning, one cap at lunch, 2 caps at bedtime, Disp: 450 capsule, Rfl: 3   Galcanezumab-gnlm (EMGALITY) 120 MG/ML SOAJ, Inject 120 mg into the skin every 30 (thirty) days., Disp: 3 mL, Rfl: 3   Glucagon, rDNA, (GLUCAGON EMERGENCY IJ), Inject 1 Syringe as directed daily as needed (emergency low blood sugar). , Disp: , Rfl:    levothyroxine (SYNTHROID) 112 MCG tablet, Take 1 tablet (112 mcg total) by mouth daily before breakfast., Disp: 90 tablet, Rfl: 3   LINZESS 145 MCG CAPS capsule, Take 1 capsule (145 mcg total) by mouth daily before breakfast., Disp: 90 capsule, Rfl: 3   loratadine (CLARITIN) 10 MG tablet, Take 10 mg by mouth daily as needed for allergies. , Disp: , Rfl:    metFORMIN (GLUCOPHAGE) 500 MG tablet, Take 500 mg by mouth daily with breakfast., Disp: , Rfl:    mirabegron ER (MYRBETRIQ) 25 MG TB24 tablet, Take 1 tablet (25 mg total) by mouth daily., Disp: 30 tablet, Rfl: 12   Multiple Vitamins-Minerals (BARIATRIC MULTIVITAMINS/IRON PO), Take 1 tablet by mouth daily., Disp: , Rfl:    nystatin (NYSTATIN) powder, APPLY TO AFFECTED AREA OF GROIN RASH TWICE A DAY FOR 7 TO 10 DAYS PER FLARE, Disp: 30 g, Rfl: 1   ONETOUCH ULTRA test strip, CHECK BLOOD SUGAR FOUR TIMES A DAY., Disp: 400 strip, Rfl: 3   pantoprazole (PROTONIX) 40 MG tablet, TAKE 1 TABLET DAILY, Disp: 90 tablet, Rfl: 0   PAZEO 0.7 % SOLN, Place 1 drop into both eyes every morning., Disp: , Rfl:    polyethylene glycol (MIRALAX / GLYCOLAX) packet, Take 17 g by mouth at bedtime., Disp: , Rfl:    Probiotic Product (PROBIOTIC DAILY PO), Take 1 capsule by mouth daily., Disp: , Rfl:    RESTASIS MULTIDOSE 0.05 % ophthalmic emulsion, Place 1 drop into  both eyes 2 (two) times daily., Disp: , Rfl:    rizatriptan (MAXALT-MLT) 10 MG disintegrating tablet, Take 1 tablet (10 mg total) by mouth as needed (take 1 tablet as needed for migraine. may repeat in 2 hours if needed)., Disp: 10 tablet, Rfl: 2   sertraline (ZOLOFT) 100 MG tablet, TAKE 2 TABLETS DAILY AS DIRECTED, Disp: 180 tablet, Rfl: 0   tiZANidine (ZANAFLEX) 2 MG tablet, TAKE 1 TABLET EVERY 8 HOURS AS NEEDED, Disp: 30 tablet, Rfl: 3   ziprasidone (GEODON) 60 MG capsule, TAKE 1 CAPSULE AT BEDTIME, Disp: 90 capsule, Rfl: 0   sennosides-docusate sodium (  SENOKOT-S) 8.6-50 MG tablet, Take 2 tablets by mouth daily. For constipation when taking pain medication (Patient not taking: No sig reported), Disp: 30 tablet, Rfl: 1   simethicone (MYLICON) 0000000 MG chewable tablet, Chew 125 mg by mouth in the morning, at noon, and at bedtime. (Patient not taking: No sig reported), Disp: , Rfl:  Social History   Socioeconomic History   Marital status: Single    Spouse name: Not on file   Number of children: 0   Years of education: HS   Highest education level: 12th grade  Occupational History   Occupation: disabled    Fish farm manager: UNEMPLOYED  Tobacco Use   Smoking status: Never   Smokeless tobacco: Never  Vaping Use   Vaping Use: Never used  Substance and Sexual Activity   Alcohol use: No   Drug use: No   Sexual activity: Never    Birth control/protection: Post-menopausal    Comment: LMP 10/2017  Other Topics Concern   Not on file  Social History Narrative   Patient is right handed.   Patient drinks 2 glasses of caffeine daily.   Lives at home. Her niece is living with her right now.   Social Determinants of Health   Financial Resource Strain: Not on file  Food Insecurity: Not on file  Transportation Needs: Not on file  Physical Activity: Not on file  Stress: Not on file  Social Connections: Not on file  Intimate Partner Violence: Not on file   Family History  Problem Relation Age of  Onset   Asthma Father    Allergies Father    Heart disease Father        enlarged heart   Peripheral vascular disease Father    Diabetes Father    Hyperlipidemia Father    Arthritis Father    Asthma Sister    Cancer Sister        colon at 45 yr old.   Colon cancer Sister    Allergies Mother    Stroke Mother 33       with hemi paralysis   Diabetes Mother    Hyperlipidemia Mother    Hypertension Mother    GI problems Mother    Arthritis Mother    Allergies Brother    Early death Brother 35       congenital abormality   Allergies Sister    Diabetes Sister    Asthma Sister    Colon polyps Sister    Hyperlipidemia Sister    GI problems Sister        gastroporesis    Liver disease Sister        fatty liver   Stroke Sister        intercrandial bleed   Diabetes Brother    Hypertension Brother    Hyperlipidemia Brother    Heart disease Paternal Aunt    Migraines Neg Hx     Objective: Office vital signs reviewed. BP 123/83   Pulse 64   Temp (!) 97.5 F (36.4 C)   Ht '5\' 4"'$  (1.626 m)   Wt 175 lb (79.4 kg)   LMP 10/17/2017 (Approximate)   SpO2 98%   BMI 30.04 kg/m   Physical Examination:  General: Awake, alert, well appearing, No acute distress HEENT: Normal, sclera white, no exophthalmos Cardio: regular rate and rhythm, S1S2 heard, no murmurs appreciated Pulm: clear to auscultation bilaterally, no wheezes, rhonchi or rales; normal work of breathing on room air Extremities: warm, well perfused, No edema, cyanosis or  clubbing; +2 pulses bilaterally. Palpable ganglion cyst at the [facility] of the pointer finger on the right. Skin: dry; intact; no rashes or lesions Neuro: See DM foot  Diabetic Foot Exam - Simple   Simple Foot Form Diabetic Foot exam was performed with the following findings: Yes 07/27/2021  9:04 AM  Visual Inspection No deformities, no ulcerations, no other skin breakdown bilaterally: Yes Sensation Testing Intact to touch and monofilament testing  bilaterally: Yes Pulse Check Posterior Tibialis and Dorsalis pulse intact bilaterally: Yes Comments     Assessment/ Plan: 55 y.o. female   Diet-controlled diabetes mellitus ([place]) - Plan: Bayer DCA Hb A1c Waived  Hypertension associated with diabetes ([place])  Hyperlipidemia associated with type 2 diabetes mellitus ([place])  History of Roux-en-Y gastric bypass - Plan: Iron, Ferritin  Ganglion cyst of finger of right hand - Plan: Ambulatory referral to Orthopedic Surgery  Fatigue, unspecified type - Plan: Iron, Ferritin, TSH, T4, Free  Still having a couple of hypoglycemic episodes but overall her issues seem to be resolving and/or stable  Blood pressure is at goal and is diet controlled  Compliant with Zetia.  Continue current regimen.  Plan for fasting lipid at next visit  Check iron level, thyroid levels given fatigue.  Will CC these to her specialist  Cyst on finger appears to be consistent with a ganglion cyst.  Referral back to Dr. Fredna Dow for further evaluation  Orders Placed This Encounter  Procedures   Bayer [place] Hb A1c Waived   Iron    Order Specific Question:   CC Results    Answer:   Redmond Pulling, ERIC [[id number]]   Ferritin    Order Specific Question:   CC Results    Answer:   Redmond Pulling, ERIC [[id number]]   TSH   T4, Free   Ambulatory referral to Orthopedic Surgery    Referral Priority:   Routine    Referral Type:   Surgical    Referral Reason:   Specialty Services Required    Requested Specialty:   Orthopedic Surgery    Number of Visits Requested:   1   No orders of the defined types were placed in this encounter.    Janora Norlander, DO [place] [phone]

## 2021-07-28 LAB — TSH: TSH: 1.85 u[IU]/mL (ref 0.450–4.500)

## 2021-07-28 LAB — FERRITIN: Ferritin: 104 ng/mL (ref 15–150)

## 2021-07-28 LAB — IRON: Iron: 77 ug/dL (ref 27–159)

## 2021-07-28 LAB — T4, FREE: Free T4: 0.89 ng/dL (ref 0.82–1.77)

## 2021-07-30 ENCOUNTER — Other Ambulatory Visit: Payer: Self-pay | Admitting: Family Medicine

## 2021-07-30 DIAGNOSIS — F3170 Bipolar disorder, currently in remission, most recent episode unspecified: Secondary | ICD-10-CM

## 2021-07-30 DIAGNOSIS — K219 Gastro-esophageal reflux disease without esophagitis: Secondary | ICD-10-CM

## 2021-08-11 ENCOUNTER — Other Ambulatory Visit: Payer: Self-pay | Admitting: Orthopedic Surgery

## 2021-08-11 DIAGNOSIS — R2231 Localized swelling, mass and lump, right upper limb: Secondary | ICD-10-CM | POA: Diagnosis not present

## 2021-08-13 DIAGNOSIS — Z9884 Bariatric surgery status: Secondary | ICD-10-CM | POA: Diagnosis not present

## 2021-08-13 DIAGNOSIS — K5909 Other constipation: Secondary | ICD-10-CM | POA: Diagnosis not present

## 2021-08-13 DIAGNOSIS — Z8639 Personal history of other endocrine, nutritional and metabolic disease: Secondary | ICD-10-CM | POA: Diagnosis not present

## 2021-09-11 ENCOUNTER — Other Ambulatory Visit: Payer: Self-pay | Admitting: Family Medicine

## 2021-09-11 DIAGNOSIS — E162 Hypoglycemia, unspecified: Secondary | ICD-10-CM

## 2021-09-11 DIAGNOSIS — E11649 Type 2 diabetes mellitus with hypoglycemia without coma: Secondary | ICD-10-CM

## 2021-09-14 ENCOUNTER — Encounter (HOSPITAL_BASED_OUTPATIENT_CLINIC_OR_DEPARTMENT_OTHER): Payer: Self-pay | Admitting: Orthopedic Surgery

## 2021-09-15 ENCOUNTER — Other Ambulatory Visit: Payer: Self-pay

## 2021-09-15 ENCOUNTER — Encounter (HOSPITAL_BASED_OUTPATIENT_CLINIC_OR_DEPARTMENT_OTHER)
Admission: RE | Admit: 2021-09-15 | Discharge: 2021-09-15 | Disposition: A | Discharge status: Home / Self Care | Payer: Medicare Other | Source: Ambulatory Visit | Attending: Orthopedic Surgery | Admitting: Orthopedic Surgery

## 2021-09-15 DIAGNOSIS — Z01818 Encounter for other preprocedural examination: Secondary | ICD-10-CM | POA: Diagnosis not present

## 2021-09-15 LAB — BASIC METABOLIC PANEL
Anion gap: 4 — ABNORMAL LOW (ref 5–15)
BUN: 19 mg/dL (ref 6–20)
CO2: 32 mmol/L (ref 22–32)
Calcium: 8.5 mg/dL — ABNORMAL LOW (ref 8.9–10.3)
Chloride: 102 mmol/L (ref 98–111)
Creatinine, Ser: 0.61 mg/dL (ref 0.44–1.00)
GFR, Estimated: >60 mL/min (ref >60)
Glucose, Bld: 80 mg/dL (ref 70–99)
Potassium: 5.2 mmol/L — ABNORMAL HIGH (ref 3.5–5.1)
Sodium: 138 mmol/L (ref 135–145)

## 2021-09-15 NOTE — Progress Notes (Signed)

## 2021-09-23 ENCOUNTER — Other Ambulatory Visit: Payer: Self-pay

## 2021-09-23 ENCOUNTER — Ambulatory Visit (HOSPITAL_BASED_OUTPATIENT_CLINIC_OR_DEPARTMENT_OTHER): Payer: Medicare Other | Admitting: Anesthesiology

## 2021-09-23 ENCOUNTER — Encounter (HOSPITAL_BASED_OUTPATIENT_CLINIC_OR_DEPARTMENT_OTHER): Payer: Self-pay | Admitting: Orthopedic Surgery

## 2021-09-23 ENCOUNTER — Encounter (HOSPITAL_BASED_OUTPATIENT_CLINIC_OR_DEPARTMENT_OTHER)
Admission: RE | Disposition: A | Discharge status: Home / Self Care | Payer: Self-pay | Source: Home / Self Care | Attending: Orthopedic Surgery

## 2021-09-23 ENCOUNTER — Ambulatory Visit (HOSPITAL_BASED_OUTPATIENT_CLINIC_OR_DEPARTMENT_OTHER)
Admission: RE | Admit: 2021-09-23 | Discharge: 2021-09-23 | Disposition: A | Discharge status: Home / Self Care | Payer: Medicare Other | Attending: Orthopedic Surgery | Admitting: Orthopedic Surgery

## 2021-09-23 DIAGNOSIS — Z79899 Other long term (current) drug therapy: Secondary | ICD-10-CM | POA: Insufficient documentation

## 2021-09-23 DIAGNOSIS — Z888 Allergy status to other drugs, medicaments and biological substances status: Secondary | ICD-10-CM | POA: Diagnosis not present

## 2021-09-23 DIAGNOSIS — G4733 Obstructive sleep apnea (adult) (pediatric): Secondary | ICD-10-CM | POA: Diagnosis not present

## 2021-09-23 DIAGNOSIS — Z883 Allergy status to other anti-infective agents status: Secondary | ICD-10-CM | POA: Diagnosis not present

## 2021-09-23 DIAGNOSIS — K219 Gastro-esophageal reflux disease without esophagitis: Secondary | ICD-10-CM | POA: Diagnosis not present

## 2021-09-23 DIAGNOSIS — M65321 Trigger finger, right index finger: Secondary | ICD-10-CM | POA: Diagnosis not present

## 2021-09-23 DIAGNOSIS — M1990 Unspecified osteoarthritis, unspecified site: Secondary | ICD-10-CM | POA: Insufficient documentation

## 2021-09-23 DIAGNOSIS — Z79890 Hormone replacement therapy (postmenopausal): Secondary | ICD-10-CM | POA: Insufficient documentation

## 2021-09-23 DIAGNOSIS — E559 Vitamin D deficiency, unspecified: Secondary | ICD-10-CM | POA: Diagnosis not present

## 2021-09-23 DIAGNOSIS — M67441 Ganglion, right hand: Secondary | ICD-10-CM | POA: Insufficient documentation

## 2021-09-23 DIAGNOSIS — E119 Type 2 diabetes mellitus without complications: Secondary | ICD-10-CM | POA: Insufficient documentation

## 2021-09-23 DIAGNOSIS — E785 Hyperlipidemia, unspecified: Secondary | ICD-10-CM | POA: Diagnosis not present

## 2021-09-23 DIAGNOSIS — Z7984 Long term (current) use of oral hypoglycemic drugs: Secondary | ICD-10-CM | POA: Diagnosis not present

## 2021-09-23 DIAGNOSIS — Z885 Allergy status to narcotic agent status: Secondary | ICD-10-CM | POA: Diagnosis not present

## 2021-09-23 DIAGNOSIS — I10 Essential (primary) hypertension: Secondary | ICD-10-CM | POA: Diagnosis not present

## 2021-09-23 DIAGNOSIS — E039 Hypothyroidism, unspecified: Secondary | ICD-10-CM | POA: Diagnosis not present

## 2021-09-23 DIAGNOSIS — R2231 Localized swelling, mass and lump, right upper limb: Secondary | ICD-10-CM | POA: Diagnosis not present

## 2021-09-23 HISTORY — PX: TRIGGER FINGER RELEASE: SHX641

## 2021-09-23 HISTORY — PX: MASS EXCISION: SHX2000

## 2021-09-23 LAB — GLUCOSE, CAPILLARY
Glucose-Capillary: 70 mg/dL (ref 70–99)
Glucose-Capillary: 87 mg/dL (ref 70–99)

## 2021-09-23 SURGERY — EXCISION MASS
Anesthesia: General | Site: Hand | Laterality: Right

## 2021-09-23 MED ORDER — ONDANSETRON HCL 4 MG/2ML IJ SOLN
INTRAMUSCULAR | Status: AC
  Filled 2021-09-23: qty 2

## 2021-09-23 MED ORDER — FENTANYL CITRATE (PF) 100 MCG/2ML IJ SOLN: 25.0000 ug | INTRAMUSCULAR | Status: DC | PRN

## 2021-09-23 MED ORDER — 0.9 % SODIUM CHLORIDE (POUR BTL) OPTIME
TOPICAL | Status: DC | PRN
  Administered 2021-09-23: 50 mL

## 2021-09-23 MED ORDER — LACTATED RINGERS IV SOLN: INTRAVENOUS | Status: DC

## 2021-09-23 MED ORDER — PROPOFOL 500 MG/50ML IV EMUL
INTRAVENOUS | Status: AC
  Filled 2021-09-23: qty 50

## 2021-09-23 MED ORDER — LIDOCAINE 2% (20 MG/ML) 5 ML SYRINGE
INTRAMUSCULAR | Status: AC
  Filled 2021-09-23: qty 5

## 2021-09-23 MED ORDER — DEXAMETHASONE SODIUM PHOSPHATE 10 MG/ML IJ SOLN
INTRAMUSCULAR | Status: DC | PRN
  Administered 2021-09-23: 4 mg via INTRAVENOUS

## 2021-09-23 MED ORDER — DEXAMETHASONE SODIUM PHOSPHATE 10 MG/ML IJ SOLN
INTRAMUSCULAR | Status: AC
  Filled 2021-09-23: qty 1

## 2021-09-23 MED ORDER — PROMETHAZINE HCL 25 MG/ML IJ SOLN: 6.2500 mg | INTRAMUSCULAR | Status: DC | PRN

## 2021-09-23 MED ORDER — FENTANYL CITRATE (PF) 100 MCG/2ML IJ SOLN
INTRAMUSCULAR | Status: AC
  Filled 2021-09-23: qty 2

## 2021-09-23 MED ORDER — CEFAZOLIN SODIUM-DEXTROSE 2-4 GM/100ML-% IV SOLN
2.0000 g | INTRAVENOUS | Status: AC
  Administered 2021-09-23: 2 g via INTRAVENOUS

## 2021-09-23 MED ORDER — LIDOCAINE 2% (20 MG/ML) 5 ML SYRINGE
INTRAMUSCULAR | Status: DC | PRN
  Administered 2021-09-23: 60 mg via INTRAVENOUS

## 2021-09-23 MED ORDER — MIDAZOLAM HCL 5 MG/5ML IJ SOLN
INTRAMUSCULAR | Status: DC | PRN
  Administered 2021-09-23: 2 mg via INTRAVENOUS

## 2021-09-23 MED ORDER — BUPIVACAINE HCL (PF) 0.25 % IJ SOLN
INTRAMUSCULAR | Status: AC
  Filled 2021-09-23: qty 90

## 2021-09-23 MED ORDER — EPHEDRINE 5 MG/ML INJ
INTRAVENOUS | Status: AC
  Filled 2021-09-23: qty 5

## 2021-09-23 MED ORDER — BUPIVACAINE HCL (PF) 0.25 % IJ SOLN
INTRAMUSCULAR | Status: DC | PRN
  Administered 2021-09-23: 8 mL

## 2021-09-23 MED ORDER — EPHEDRINE SULFATE-NACL 50-0.9 MG/10ML-% IV SOSY
PREFILLED_SYRINGE | INTRAVENOUS | Status: DC | PRN
  Administered 2021-09-23: 10 mg via INTRAVENOUS

## 2021-09-23 MED ORDER — FENTANYL CITRATE (PF) 100 MCG/2ML IJ SOLN
INTRAMUSCULAR | Status: DC | PRN
  Administered 2021-09-23: 25 ug via INTRAVENOUS
  Administered 2021-09-23: 50 ug via INTRAVENOUS
  Administered 2021-09-23: 25 ug via INTRAVENOUS

## 2021-09-23 MED ORDER — HYDROCODONE-ACETAMINOPHEN 5-325 MG PO TABS: ORAL_TABLET | ORAL | 0 refills | Status: DC

## 2021-09-23 MED ORDER — AMISULPRIDE (ANTIEMETIC) 5 MG/2ML IV SOLN: 10.0000 mg | Freq: Once | INTRAVENOUS | Status: DC | PRN

## 2021-09-23 MED ORDER — MIDAZOLAM HCL 2 MG/2ML IJ SOLN
INTRAMUSCULAR | Status: AC
  Filled 2021-09-23: qty 2

## 2021-09-23 MED ORDER — ACETAMINOPHEN 10 MG/ML IV SOLN: 1000.0000 mg | Freq: Once | INTRAVENOUS | Status: DC | PRN

## 2021-09-23 MED ORDER — ONDANSETRON HCL 4 MG/2ML IJ SOLN
INTRAMUSCULAR | Status: DC | PRN
  Administered 2021-09-23: 4 mg via INTRAVENOUS

## 2021-09-23 MED ORDER — CEFAZOLIN SODIUM-DEXTROSE 2-4 GM/100ML-% IV SOLN
INTRAVENOUS | Status: AC
  Filled 2021-09-23: qty 100

## 2021-09-23 MED ORDER — PROPOFOL 10 MG/ML IV BOLUS
INTRAVENOUS | Status: DC | PRN
  Administered 2021-09-23: 200 mg via INTRAVENOUS

## 2021-09-23 SURGICAL SUPPLY — 53 items
APL PRP STRL LF DISP 70% ISPRP (MISCELLANEOUS) ×1
APL SKNCLS STERI-STRIP NONHPOA (GAUZE/BANDAGES/DRESSINGS)
BENZOIN TINCTURE PRP APPL 2/3 (GAUZE/BANDAGES/DRESSINGS) IMPLANT
BLADE MINI RND TIP GREEN BEAV (BLADE) IMPLANT
BLADE SURG 15 STRL LF DISP TIS (BLADE) ×2 IMPLANT
BLADE SURG 15 STRL SS (BLADE) ×4
BNDG CMPR 9X4 STRL LF SNTH (GAUZE/BANDAGES/DRESSINGS) ×1
BNDG COHESIVE 1X5 TAN STRL LF (GAUZE/BANDAGES/DRESSINGS) IMPLANT
BNDG COHESIVE 2X5 TAN ST LF (GAUZE/BANDAGES/DRESSINGS) ×2 IMPLANT
BNDG CONFORM 2 STRL LF (GAUZE/BANDAGES/DRESSINGS) IMPLANT
BNDG ELASTIC 2X5.8 VLCR STR LF (GAUZE/BANDAGES/DRESSINGS) IMPLANT
BNDG ELASTIC 3X5.8 VLCR STR LF (GAUZE/BANDAGES/DRESSINGS) IMPLANT
BNDG ESMARK 4X9 LF (GAUZE/BANDAGES/DRESSINGS) ×1 IMPLANT
BNDG GAUZE 1X2.1 STRL (MISCELLANEOUS) IMPLANT
BNDG GAUZE ELAST 4 BULKY (GAUZE/BANDAGES/DRESSINGS) ×1 IMPLANT
BNDG PLASTER X FAST 3X3 WHT LF (CAST SUPPLIES) IMPLANT
BNDG PLSTR 9X3 FST ST WHT (CAST SUPPLIES)
CHLORAPREP W/TINT 26 (MISCELLANEOUS) ×2 IMPLANT
CORD BIPOLAR FORCEPS 12FT (ELECTRODE) ×2 IMPLANT
COVER BACK TABLE 60X90IN (DRAPES) ×2 IMPLANT
COVER MAYO STAND STRL (DRAPES) ×2 IMPLANT
CUFF TOURN SGL QUICK 18X4 (TOURNIQUET CUFF) ×2 IMPLANT
DRAPE EXTREMITY T 121X128X90 (DISPOSABLE) ×2 IMPLANT
DRAPE SURG 17X23 STRL (DRAPES) ×2 IMPLANT
GAUZE SPONGE 4X4 12PLY STRL (GAUZE/BANDAGES/DRESSINGS) ×2 IMPLANT
GAUZE XEROFORM 1X8 LF (GAUZE/BANDAGES/DRESSINGS) ×2 IMPLANT
GLOVE SRG 8 PF TXTR STRL LF DI (GLOVE) ×1 IMPLANT
GLOVE SURG ENC MOIS LTX SZ7.5 (GLOVE) ×2 IMPLANT
GLOVE SURG UNDER POLY LF SZ8 (GLOVE) ×2
GOWN STRL REUS W/ TWL LRG LVL3 (GOWN DISPOSABLE) ×1 IMPLANT
GOWN STRL REUS W/TWL LRG LVL3 (GOWN DISPOSABLE) ×2
GOWN STRL REUS W/TWL XL LVL3 (GOWN DISPOSABLE) ×2 IMPLANT
NDL HYPO 25X1 1.5 SAFETY (NEEDLE) ×1 IMPLANT
NEEDLE HYPO 25X1 1.5 SAFETY (NEEDLE) ×2 IMPLANT
NS IRRIG 1000ML POUR BTL (IV SOLUTION) ×2 IMPLANT
PACK BASIN DAY SURGERY FS (CUSTOM PROCEDURE TRAY) ×2 IMPLANT
PAD CAST 3X4 CTTN HI CHSV (CAST SUPPLIES) IMPLANT
PAD CAST 4YDX4 CTTN HI CHSV (CAST SUPPLIES) IMPLANT
PADDING CAST ABS 4INX4YD NS (CAST SUPPLIES) ×1
PADDING CAST ABS COTTON 4X4 ST (CAST SUPPLIES) ×1 IMPLANT
PADDING CAST COTTON 3X4 STRL (CAST SUPPLIES)
PADDING CAST COTTON 4X4 STRL (CAST SUPPLIES)
STOCKINETTE 4X48 STRL (DRAPES) ×2 IMPLANT
STRIP CLOSURE SKIN 1/2X4 (GAUZE/BANDAGES/DRESSINGS) IMPLANT
SUT ETHILON 3 0 PS 1 (SUTURE) IMPLANT
SUT ETHILON 4 0 PS 2 18 (SUTURE) ×2 IMPLANT
SUT ETHILON 5 0 P 3 18 (SUTURE)
SUT NYLON ETHILON 5-0 P-3 1X18 (SUTURE) IMPLANT
SUT VIC AB 4-0 P2 18 (SUTURE) IMPLANT
SYR BULB EAR ULCER 3OZ GRN STR (SYRINGE) ×2 IMPLANT
SYR CONTROL 10ML LL (SYRINGE) ×2 IMPLANT
TOWEL GREEN STERILE FF (TOWEL DISPOSABLE) ×4 IMPLANT
UNDERPAD 30X36 HEAVY ABSORB (UNDERPADS AND DIAPERS) ×2 IMPLANT

## 2021-09-23 NOTE — Transfer of Care (Signed)
Immediate Anesthesia Transfer of Care Note  Patient: LIBA HULSEY  Procedure(s) Performed: EXCISION MASS VOLAR ASPECT RIGHT HAND (Right: Hand) RELEASE A-1 PULLEY RIGHT INDEX FINGER (Right: Hand)  Patient Location: PACU  Anesthesia Type:General  Level of Consciousness: awake, alert  and oriented  Airway & Oxygen Therapy: Patient Spontanous Breathing and Patient connected to face mask oxygen  Post-op Assessment: Report given to RN and Post -op Vital signs reviewed and stable  Post vital signs: Reviewed and stable  Last Vitals:  Vitals Value Taken Time  BP 122/72 09/23/21 0820  Temp    Pulse 70 09/23/21 0823  Resp 14 09/23/21 0823  SpO2 99 % 09/23/21 0823  Vitals shown include unvalidated device data.  Last Pain:  Vitals:   09/23/21 0640  TempSrc: Oral  PainSc: 5       Patients Stated Pain Goal: 5 (47/20/72 1828)  Complications: No notable events documented.

## 2021-09-23 NOTE — H&P (Signed)
Morgan [name] is an 55 y.o. female.   Chief Complaint: trigger finger and mass HPI: 55 yo female with triggering right index finger and mass at base of index finger.  Mass has been present ~4 years.  These are bothersome to her.  She wishes to have the mass removed and have trigger digit released.  Allergies:  Allergies  Allergen Reactions   Ketoconazole Hives and Swelling    SWELLING REACTION UNSPECIFIED    Pravachol [Pravastatin Sodium] Shortness Of Breath, Swelling and Anaphylaxis    Throat swelling   Statins Other (See Comments)    Leg cramps   Tape Dermatitis and Other (See Comments)    Steri-Strips   Codeine Hypertension    increased BP    Past Medical History:  Diagnosis Date   Allergic rhinitis    Anemia     after gastric bypass in 2018   Anxiety    Barrett's esophagus    Bipolar affective disorder (HCC)    CAP (community acquired pneumonia) 07/20/2018   Chronic respiratory failure (HCC)    Constipation, chronic    Degenerative joint disease of spine    Depression    Diabetes mellitus without complication ([place])    type 2    DM (diabetes mellitus) ([place])    "hypoglycemic" per pt - no longer takes DM meds due to weight loss from gastric bypass in 2018   Dry eye    Family history of adverse reaction to anesthesia    nausea and vomiting   Gastroparesis    GERD (gastroesophageal reflux disease)    H/O shoulder replacement 08/11/2020   left shoulder   History of bariatric surgery 03/2017   History of hiatal hernia    HLD (hyperlipidemia) 03/12/2013   Hyperlipidemia    Hypertension    No HTN meds since weight loss from Gastric Bypass in 2018   Intractable chronic migraine without aura 06/04/2015   Migraine headache    Morbid obesity (HCC)    OSA (obstructive sleep apnea)    No cpap since gastric surgery   Osteoarthritis    bilateral knee   SBO (small bowel obstruction) ([place]) 03/19/2019   Sleep apnea    Unspecified hypothyroidism    Vitamin B 12 deficiency     Vitamin D deficiency     Past Surgical History:  Procedure Laterality Date   ANKLE ARTHROSCOPY WITH RECONSTRUCTION Right 09/24/2019   Procedure: ANKLE ARTHROSCOPY DEBRIDEMENT TREATMENT OF OSTEOCHONDRAL LESION TALUS. PRONEAL TENDON DEBRIDEMENT;  Surgeon: Erle Crocker, MD;  Location: [place];  Service: Orthopedics;  Laterality: Right;  SURGERY REQUEST TIME 2 HOURS   APPENDECTOMY  1978   CHOLECYSTECTOMY  2005   COLONOSCOPY     disc repair  05/17/2019   with rods, lumbar spine   GASTRIC ROUX-EN-Y N/A 03/21/2017   Procedure: LAPAROSCOPIC ROUX-EN-Y GASTRIC, UPPER ENDO;  Surgeon: Greer Pickerel, MD;  Location: [facility];  Service: General;  Laterality: N/A;   GASTROSTOMY N/A 06/19/2018   Procedure: LAPRASCOPIC INSERTION OF GASTROSTOMY TUBE;  Surgeon: Greer Pickerel, MD;  Location: [facility];  Service: General;  Laterality: N/A;   GASTROSTOMY TUBE PLACEMENT Left    06/2018   HERNIA REPAIR     HIATAL HERNIA REPAIR N/A 06/19/2018   Procedure: LAPAROSCOPIC REPAIR OF HIATAL HERNIA;  Surgeon: Greer Pickerel, MD;  Location: [facility];  Service: General;  Laterality: N/A;   LAPAROSCOPIC LYSIS OF ADHESIONS  03/19/2019   Dr. Romana Juniper   LAPAROSCOPY N/A 06/19/2018   Procedure:  LAPAROSCOPY DIAGNOSTIC;  Surgeon: Greer Pickerel, MD;  Location: [facility];  Service: General;  Laterality: N/A;   LAPAROSCOPY N/A 03/19/2019   Procedure: LAPAROSCOPY DIAGNOSTIC lysis of adhesions;  Surgeon: Clovis Riley, MD;  Location: [facility];  Service: General;  Laterality: N/A;   LUMBAR LAMINECTOMY/DECOMPRESSION MICRODISCECTOMY Right 10/11/2018   Procedure: LAMINECTOMY AND FORAMINOTOMY RIGHT LUMBAR FOUR- LUMBAR FIVE;  Surgeon: Consuella Lose, MD;  Location: [place];  Service: Neurosurgery;  Laterality: Right;   SHOULDER ARTHROSCOPY  7/12   left-dsc   TONSILLECTOMY  at age 67   TOTAL SHOULDER ARTHROPLASTY Left 08/11/2020   Procedure: TOTAL SHOULDER ARTHROPLASTY;  Surgeon: Marchia Bond, MD;  Location: [place];  Service: Orthopedics;  Laterality: Left;   TRIGGER FINGER RELEASE  12/20/2012   Procedure: RELEASE TRIGGER FINGER/A-1 PULLEY;  Surgeon: Tennis Must, MD;  Location: [place];  Service: Orthopedics;  Laterality: Left;  LEFT TRIGGER THUMB RELEASE    Family History: Family History  Problem Relation Age of Onset   Asthma Father    Allergies Father    Heart disease Father        enlarged heart   Peripheral vascular disease Father    Diabetes Father    Hyperlipidemia Father    Arthritis Father    Asthma Sister    Cancer Sister        colon at 94 yr old.   Colon cancer Sister    Allergies Mother    Stroke Mother 27       with hemi paralysis   Diabetes Mother    Hyperlipidemia Mother    Hypertension Mother    GI problems Mother    Arthritis Mother    Allergies Brother    Early death Brother 22       congenital abormality   Allergies Sister    Diabetes Sister    Asthma Sister    Colon polyps Sister    Hyperlipidemia Sister    GI problems Sister        gastroporesis    Liver disease Sister        fatty liver   Stroke Sister        intercrandial bleed   Diabetes Brother    Hypertension Brother    Hyperlipidemia Brother    Heart disease Paternal Aunt    Migraines Neg Hx     Social History:   reports that she has never smoked. She has never used smokeless tobacco. She reports that she does not drink alcohol and does not use drugs.  Medications: Medications Prior to Admission  Medication Sig Dispense Refill   Calcium-Vitamin D-Vitamin K 650-12.5-40 MG-MCG-MCG CHEW Chew 1 tablet by mouth 2 (two) times daily.     dicyclomine (BENTYL) 20 MG tablet TAKE 1 TABLET EVERY 8 HOURS AS NEEDED FOR ABDOMINAL CRAMPING 90 tablet 0   divalproex (DEPAKOTE) 500 MG DR tablet Take 500 mg by mouth in the morning and 1000 mg at bedtime 270 tablet 3   ezetimibe (ZETIA) 10 MG tablet Take 1 tablet (10 mg total) by mouth daily. 90 tablet 3   fluticasone (FLONASE) 50 MCG/ACT  nasal spray USE 2 SPRAYS IN EACH NOSTRIL TWICE DAILY AS NEEDED FOR ALLERGY 16 g 4   gabapentin (NEURONTIN) 300 MG capsule Takes 2 capsules in morning, one cap at lunch, 2 caps at bedtime 450 capsule 3   Galcanezumab-gnlm (EMGALITY) 120 MG/ML SOAJ Inject 120 mg into the skin every 30 (thirty) days. 3 mL  3   levothyroxine (SYNTHROID) 112 MCG tablet Take 1 tablet (112 mcg total) by mouth daily before breakfast. 90 tablet 3   LINZESS 145 MCG CAPS capsule Take 1 capsule (145 mcg total) by mouth daily before breakfast. 90 capsule 3   loratadine (CLARITIN) 10 MG tablet Take 10 mg by mouth daily as needed for allergies.      metFORMIN (GLUCOPHAGE) 500 MG tablet Take 500 mg by mouth daily with breakfast.     mirabegron ER (MYRBETRIQ) 25 MG TB24 tablet Take 1 tablet (25 mg total) by mouth daily. 30 tablet 12   Multiple Vitamins-Minerals (BARIATRIC MULTIVITAMINS/IRON PO) Take 1 tablet by mouth daily.     pantoprazole (PROTONIX) 40 MG tablet TAKE 1 TABLET DAILY 90 tablet 1   PAZEO 0.7 % SOLN Place 1 drop into both eyes every morning.     polyethylene glycol (MIRALAX / GLYCOLAX) packet Take 17 g by mouth at bedtime.     Probiotic Product (PROBIOTIC DAILY PO) Take 1 capsule by mouth daily.     RESTASIS MULTIDOSE 0.05 % ophthalmic emulsion Place 1 drop into both eyes 2 (two) times daily.     rizatriptan (MAXALT-MLT) 10 MG disintegrating tablet Take 1 tablet (10 mg total) by mouth as needed (take 1 tablet as needed for migraine. may repeat in 2 hours if needed). 10 tablet 2   sennosides-docusate sodium (SENOKOT-S) 8.6-50 MG tablet Take 2 tablets by mouth daily. For constipation when taking pain medication 30 tablet 1   sertraline (ZOLOFT) 100 MG tablet TAKE 2 TABLETS DAILY AS DIRECTED 180 tablet 1   Simethicone (GAS-X PO) Take by mouth.     tiZANidine (ZANAFLEX) 2 MG tablet TAKE 1 TABLET EVERY 8 HOURS AS NEEDED 30 tablet 3   ziprasidone (GEODON) 60 MG capsule TAKE 1 CAPSULE AT BEDTIME 90 capsule 1   Glucagon,  rDNA, (GLUCAGON EMERGENCY IJ) Inject 1 Syringe as directed daily as needed (emergency low blood sugar).      glucose blood (ONETOUCH ULTRA) test strip Check BS four times a day Dx E11.9 400 strip 3   nystatin (NYSTATIN) powder APPLY TO AFFECTED AREA OF GROIN RASH TWICE A DAY FOR 7 TO 10 DAYS PER FLARE 30 g 1    Results for orders placed or performed during the hospital encounter of 09/23/21 (from the past 48 hour(s))  Glucose, capillary     Status: None   Collection Time: 09/23/21  6:25 AM  Result Value Ref Range   Glucose-Capillary 70 70 - 99 mg/dL    Comment: Glucose reference range applies only to samples taken after fasting for at least 8 hours.    No results found.     Blood pressure 133/77, pulse 63, temperature 98.3 F (36.8 C), temperature source Oral, resp. rate 15, height 5\' 4"  (1.626 m), weight 77.4 kg, last menstrual period 10/17/2017, SpO2 100 %.  General appearance: alert, cooperative, and appears stated age Head: Normocephalic, without obvious abnormality, atraumatic Neck: supple, symmetrical, trachea midline Cardio: regular rate and rhythm Resp: clear to auscultation bilaterally Extremities: Intact sensation and capillary refill all digits.  +epl/fpl/io.  No wounds.  Pulses: 2+ and symmetric Skin: Skin color, texture, turgor normal. No rashes or lesions Neurologic: Grossly normal Incision/Wound: none  Assessment/Plan Right index finger trigger digit and palm mass.  Plan trigger release and excision of mass.  Non operative and operative treatment options have been discussed with the patient and patient wishes to proceed with operative treatment. Risks, benefits, and alternatives of surgery  have been discussed and the patient agrees with the plan of care.   Leanora Cover 09/23/2021, 7:39 AM

## 2021-09-23 NOTE — Op Note (Signed)
09/23/2021 [facility]  Operative Note  PREOPERATIVE DIAGNOSIS: Right index finger trigger digit and MASS VOLAR ASPECT RIGHT HAND  POSTOPERATIVE DIAGNOSIS: Right index finger trigger digit and MASS VOLAR ASPECT RIGHT HAND  PROCEDURE: Procedure(s): 1.  Excision of annular ligament cyst right index finger 2.  Right index finger trigger release  SURGEON:  Leanora Cover, MD  ASSISTANT:  none.  ANESTHESIA:  General.  IV FLUIDS:  Per anesthesia flow sheet.  ESTIMATED BLOOD LOSS:  Minimal.  COMPLICATIONS:  None.  SPECIMENS: Right index finger annular ligament cyst to pathology  TOURNIQUET TIME:  Total Tourniquet Time Documented: Upper Arm (Right) - 12 minutes Total: Upper Arm (Right) - 12 minutes   DISPOSITION:  Stable to PACU.  LOCATION: [facility]  INDICATIONS: Morgan [name] is a 55 y.o. female who is noted a mass at the volar aspect of the base of the right index finger for several years.  She also has triggering of the digit.  She wished to have the mass removed and the trigger digit released.  Risks, benefits and alternatives of surgery were discussed including the risk of blood loss, infection, damage to nerves, vessels, tendons, ligaments, bone, failure of surgery, need for additional surgery, complications with wound healing, continued pain, continued triggering and need for repeat surgery.  She voiced understanding of these risks and elected to proceed.  OPERATIVE COURSE:  After being identified preoperatively by myself, the patient and I agreed upon the procedure and site of procedure.  The surgical site was marked. Surgical consent had been signed. She was given IV Ancef as preoperative antibiotic prophylaxis. She was transported to the operating room and placed on the operating room table in supine position with the right upper extremity on an arm board. General anesthesia was induced by the anesthesiologist.  The right upper extremity was  prepped and draped in normal sterile orthopedic fashion. A surgical pause was performed between surgeons, anesthesia, and operating room staff, and all were in agreement as to the patient, procedure, and site of procedure.  Tourniquet at the proximal aspect of the extremity was inflated to 250 mmHg after exsanguination of the arm with an Esmarch bandage.  An incision was made at the volar aspect of the MP joint of the index finger.  This was carried into the subcutaneous tissues by spreading technique.   The radial and ulnar digital nerves were protected throughout the case.  The mass was easily identified.  It appeared to be an annular ligament cyst.  Was carefully freed up from soft tissue attachments.  It was removed along with a window of the A2 pulley from which it was coming.  It was sent to pathology for examination.  The flexor sheath was identified.  The A1 pulley was identified and sharply incised.  It was released in its entirety.  The proximal 1-2 mm of the A2 pulley was vented to allow better excursion of the tendons.  The finger was placed through a range of motion and there was noted to be no catching.  The tendons were brought through the wound and any adherences released.  The wound was then copiously irrigated with sterile saline. It was closed with 4-0 nylon in a horizontal mattress fashion.  It was injected with 0.25% plain Marcaine to aid in postoperative analgesia.  It was dressed with sterile Xeroform, 4x4s, and wrapped lightly with a Coban dressing.  Tourniquet was deflated at 12 minutes.  The fingertips were pink with brisk capillary  refill after deflation of the tourniquet.  The operative drapes were broken down and the patient was awoken from anesthesia safely.  She was transferred back to the stretcher and taken to the PACU in stable condition.   I will see her back in the office in 1 week for postoperative followup.  I will give her a prescription for Norco 5/325 1-2 tabs PO q6 hours prn  pain, dispense #15.    Leanora Cover, MD Electronically signed, 09/23/21

## 2021-09-23 NOTE — Anesthesia Preprocedure Evaluation (Addendum)
Anesthesia Evaluation  Patient identified by MRN, date of birth, ID band Patient awake    Reviewed: Allergy & Precautions, NPO status , Patient's Chart, lab work & pertinent test results  Airway Mallampati: III  TM Distance: >3 FB Neck ROM: Full    Dental no notable dental hx.    Pulmonary    Pulmonary exam normal breath sounds clear to auscultation       Cardiovascular Normal cardiovascular exam Rhythm:Regular Rate:Normal  ECG: NSR, rate 64   Neuro/Psych  Headaches, PSYCHIATRIC DISORDERS Anxiety Depression Bipolar Disorder    GI/Hepatic Neg liver ROS, hiatal hernia, GERD  Medicated and Controlled,  Endo/Other  Hypothyroidism   Renal/GU negative Renal ROS     Musculoskeletal  (+) Arthritis ,   Abdominal   Peds  Hematology negative hematology ROS (+)   Anesthesia Other Findings MASS VOLAR ASPECT RIGHT HAND  Reproductive/Obstetrics                            Anesthesia Physical Anesthesia Plan  ASA: 2  Anesthesia Plan: General   Post-op Pain Management:    Induction: Intravenous  PONV Risk Score and Plan: 3 and Ondansetron, Dexamethasone, Midazolam and Treatment may vary due to age or medical condition  Airway Management Planned: LMA  Additional Equipment:   Intra-op Plan:   Post-operative Plan: Extubation in OR  Informed Consent: I have reviewed the patients History and Physical, chart, labs and discussed the procedure including the risks, benefits and alternatives for the proposed anesthesia with the patient or authorized representative who has indicated his/her understanding and acceptance.     Dental advisory given  Plan Discussed with: CRNA  Anesthesia Plan Comments:         Anesthesia Quick Evaluation

## 2021-09-23 NOTE — Anesthesia Procedure Notes (Signed)
Procedure Name: LMA Insertion Date/Time: 09/23/2021 7:51 AM Performed by: Genelle Bal, CRNA Pre-anesthesia Checklist: Patient identified, Emergency Drugs available, Suction available and Patient being monitored Patient Re-evaluated:Patient Re-evaluated prior to induction Oxygen Delivery Method: Circle system utilized Preoxygenation: Pre-oxygenation with 100% oxygen Induction Type: IV induction Ventilation: Mask ventilation without difficulty LMA: LMA inserted LMA Size: 4.0 Number of attempts: 1 Airway Equipment and Method: Bite block Placement Confirmation: positive ETCO2 Tube secured with: Tape Dental Injury: Teeth and Oropharynx as per pre-operative assessment

## 2021-09-23 NOTE — Discharge Instructions (Addendum)

## 2021-09-23 NOTE — Anesthesia Postprocedure Evaluation (Signed)
Anesthesia Post Note  Patient: Morgan [name]  Procedure(s) Performed: EXCISION MASS VOLAR ASPECT RIGHT HAND (Right: Hand) RELEASE A-1 PULLEY RIGHT INDEX FINGER (Right: Hand)     Patient location during evaluation: PACU Anesthesia Type: General Level of consciousness: awake and alert Pain management: pain level controlled Vital Signs Assessment: post-procedure vital signs reviewed and stable Respiratory status: spontaneous breathing, nonlabored ventilation and respiratory function stable Cardiovascular status: blood pressure returned to baseline and stable Postop Assessment: no apparent nausea or vomiting Anesthetic complications: no   No notable events documented.  Last Vitals:  Vitals:   09/23/21 0845 09/23/21 0900  BP: 116/66 117/84  Pulse: 62 66  Resp: (!) 9 14  Temp:  36.6 C  SpO2: 97% 95%    Last Pain:  Vitals:   09/23/21 0900  TempSrc:   PainSc: 0-No pain                 Lynda Rainwater

## 2021-09-24 ENCOUNTER — Encounter (HOSPITAL_BASED_OUTPATIENT_CLINIC_OR_DEPARTMENT_OTHER): Payer: Self-pay | Admitting: Orthopedic Surgery

## 2021-09-27 LAB — SURGICAL PATHOLOGY

## 2021-10-21 NOTE — Progress Notes (Signed)
PATIENT: Morgan [name] DOB: 09-Aug-1966  REASON FOR VISIT: follow up HISTORY FROM: patient  Virtual Visit via Telephone Note  I connected with Morgan [name] on 10/21/21 at  7:45 AM EST by telephone and verified that I am speaking with the correct person using two identifiers.   I discussed the limitations, risks, security and privacy concerns of performing an evaluation and management service by telephone and the availability of in person appointments. I also discussed with the patient that there may be a patient responsible charge related to this service. The patient expressed understanding and agreed to proceed.   History of Present Illness:  10/21/21 ALL: Morgan [name] returns for migraine follow up. She continues divalproex, gabapentin and Emgality. Rizatriptan used for abortive therapy. She reports headaches are fairly well managed. She has had a few more headaches over the past couple of months. She has 3-5 migraines per month. Rizatriptan works well for abortive therapy. She reports that CBGs have been fluctuating. She is scheduling follow up with PCP and endo to discuss.   10/20/2020 ALL:  Morgan [name] is a 55 y.o. female here today for follow up for migraines. We continues divalproex, gabapentin and Emgality. She was started on rizatriptan for abortive therapy at last follow up. She reports that she is doing very well. She has 2-3 headache days per month on average. Rizatriptan has worked very well for abortive therapy. She is tolerating medications well with no obvious adverse effects. She had left shoulder replacement in 07/2020 and is recovering well.   History (copied from my note on 04/14/2020)  Morgan [name] is a 55 y.o. female here today for follow up of migraines.  She continues divalproex and gabapentin as prescribed.  We switched her to [facility] in February she reports that headaches have decreased significantly in intensity and frequency.  She had 4 headaches in the month  of April.  She continues to follow with orthopedics closely for neck pain.  She has been dissipated in physical therapy and received 1 steroid injection.  She does not feel that either were very beneficial.  She has tried dry needling with no success.   History (copied from my note on 01/14/2020)   Morgan [name] is a 55 y.o. female here today for follow up.She continues divalproex 500mg  and 1000mg  at night, gabapentin 600mg  in the am, 300mg  at lunch and 600mg  at bedtime and Ajovy monthly. She does feel that headaches are less severe but continue daily. She feels that pounding headaches are much less severe. She is tolerating medications well, however, insurance is requiring that she try Emgality. They will no longer pay for Ajovy. Insurance will cover [employer]. She is willing to switch medications. She continues to have chronic neck pain managed by orthopedics at this time. She is current treating with steroid injections.    History (copied from Dr Cathren Laine note on 05/22/2019)   Interval history: 24 headache days a month. Most are migraines. 15 are severe. Ajovy may be helping a little, she has had so much going on she doesn't know if that is contributing. She had emergency surgery in April and recent back surgery. She is taking oxycontin and 325mg  tylenol. She will keep on the Ajovy and when all the issues are calmed down she will call us and discuss nerve blocks.    Observations/Objective:  Generalized: Well developed, in no acute distress  Mentation: Alert oriented to time, place, history taking. Follows all commands speech and language fluent  Assessment and Plan:  55 y.o. year old female  has a past medical history of Allergic rhinitis, Anemia, Anxiety, Barrett's esophagus, Bipolar affective disorder ([facility]), CAP (community acquired pneumonia) (07/20/2018), Chronic respiratory failure ([place]), Constipation, chronic, Degenerative joint disease of spine, Depression, Diabetes mellitus without  complication ([place]), DM (diabetes mellitus) ([place]), Dry eye, Family history of adverse reaction to anesthesia, Gastroparesis, GERD (gastroesophageal reflux disease), H/O shoulder replacement (08/11/2020), History of bariatric surgery (03/2017), History of hiatal hernia, HLD (hyperlipidemia) (03/12/2013), Hyperlipidemia, Hypertension, Intractable chronic migraine without aura (06/04/2015), Migraine headache, Morbid obesity ([place]), OSA (obstructive sleep apnea), Osteoarthritis, SBO (small bowel obstruction) ([place]) (03/19/2019), Sleep apnea, Unspecified hypothyroidism, Vitamin B 12 deficiency, and Vitamin D deficiency. here with  No diagnosis found.  Morgan [name] is doing very well, today.  Migraines are well managed on current treatment plan.  We will continue divalproex 500/100, gabapentin 600/300/600mg  and Emgality as prescribed.  She will continue rizatriptan as needed for abortive therapy.  She will continue to focus on healthy lifestyle habits. She will continue to monitor CBGs.  Follow-up with care team as advised.  She will return to see me in 1 year for migraine follow-up.  She is aware she may call sooner if needed.  She verbalizes understanding and agreement with this plan.  No orders of the defined types were placed in this encounter.   No orders of the defined types were placed in this encounter.    Follow Up Instructions:  I discussed the assessment and treatment plan with the patient. The patient was provided an opportunity to ask questions and all were answered. The patient agreed with the plan and demonstrated an understanding of the instructions.   The patient was advised to call back or seek an in-person evaluation if the symptoms worsen or if the condition fails to improve as anticipated.  I provided 15 minutes of non-face-to-face time during this encounter. Patient is located at her place of residence, provider is in the office.    Debbora Presto, NP   Made any corrections needed, and agree with  history, physical, neuro exam,assessment and plan as stated.     Sarina Ill, MD [facility]

## 2021-10-21 NOTE — Patient Instructions (Signed)
Below is our plan:  We will continue divalproex, gabapentin and Emgality. Continue rizatriptan for abortive therapy. Continue to monitor CBGs. If headaches worsen, let me know!  Please make sure you are staying well hydrated. I recommend 50-60 ounces daily. Well balanced diet and regular exercise encouraged. Consistent sleep schedule with 6-8 hours recommended.   Please continue follow up with care team as directed.   Follow up with me in 1 year   You may receive a survey regarding today's visit. I encourage you to leave honest feed back as I do use this information to improve patient care. Thank you for seeing me today!

## 2021-10-26 ENCOUNTER — Encounter: Payer: Self-pay | Admitting: Family Medicine

## 2021-10-26 ENCOUNTER — Telehealth (INDEPENDENT_AMBULATORY_CARE_PROVIDER_SITE_OTHER): Payer: Medicare Other | Admitting: Family Medicine

## 2021-10-26 DIAGNOSIS — G43009 Migraine without aura, not intractable, without status migrainosus: Secondary | ICD-10-CM | POA: Diagnosis not present

## 2021-10-26 MED ORDER — GABAPENTIN 300 MG PO CAPS: ORAL_CAPSULE | ORAL | 3 refills | Status: DC

## 2021-10-26 MED ORDER — EMGALITY 120 MG/ML ~~LOC~~ SOAJ: 120.0000 mg | SUBCUTANEOUS | 3 refills | Status: DC

## 2021-10-26 MED ORDER — DIVALPROEX SODIUM 500 MG PO DR TAB: DELAYED_RELEASE_TABLET | ORAL | 3 refills | Status: DC

## 2021-10-28 ENCOUNTER — Other Ambulatory Visit: Payer: Self-pay | Admitting: Family Medicine

## 2021-11-12 DIAGNOSIS — E162 Hypoglycemia, unspecified: Secondary | ICD-10-CM | POA: Diagnosis not present

## 2021-11-12 DIAGNOSIS — E161 Other hypoglycemia: Secondary | ICD-10-CM | POA: Diagnosis not present

## 2021-11-26 ENCOUNTER — Other Ambulatory Visit: Payer: Self-pay | Admitting: Family Medicine

## 2021-11-26 DIAGNOSIS — N3281 Overactive bladder: Secondary | ICD-10-CM

## 2021-11-30 ENCOUNTER — Other Ambulatory Visit: Payer: Self-pay

## 2021-11-30 MED ORDER — RIZATRIPTAN BENZOATE 10 MG PO TBDP
10.0000 mg | ORAL_TABLET | ORAL | 2 refills | Status: DC | PRN
Start: 2021-11-30 — End: 2022-10-31

## 2021-12-21 DIAGNOSIS — Z1231 Encounter for screening mammogram for malignant neoplasm of breast: Secondary | ICD-10-CM | POA: Diagnosis not present

## 2021-12-23 DIAGNOSIS — E161 Other hypoglycemia: Secondary | ICD-10-CM | POA: Diagnosis not present

## 2021-12-23 DIAGNOSIS — Z79899 Other long term (current) drug therapy: Secondary | ICD-10-CM | POA: Diagnosis not present

## 2021-12-23 DIAGNOSIS — E162 Hypoglycemia, unspecified: Secondary | ICD-10-CM | POA: Diagnosis not present

## 2021-12-24 ENCOUNTER — Other Ambulatory Visit: Payer: Medicare Other

## 2021-12-24 ENCOUNTER — Other Ambulatory Visit: Payer: Self-pay | Admitting: Family Medicine

## 2021-12-24 DIAGNOSIS — Z9884 Bariatric surgery status: Secondary | ICD-10-CM

## 2021-12-25 LAB — CBC
Hematocrit: 37.8 % (ref 34.0–46.6)
Hemoglobin: 12.7 g/dL (ref 11.1–15.9)
MCH: 31.2 pg (ref 26.6–33.0)
MCHC: 33.6 g/dL (ref 31.5–35.7)
MCV: 93 fL (ref 79–97)
Platelets: 216 10*3/uL (ref 150–450)
RBC: 4.07 x10E6/uL (ref 3.77–5.28)
RDW: 12.3 % (ref 11.7–15.4)
WBC: 3.7 10*3/uL (ref 3.4–10.8)

## 2021-12-25 LAB — CMP14+EGFR
ALT: 10 IU/L (ref 0–32)
AST: 17 IU/L (ref 0–40)
Albumin/Globulin Ratio: 2.2 (ref 1.2–2.2)
Albumin: 4 g/dL (ref 3.8–4.9)
Alkaline Phosphatase: 59 IU/L (ref 44–121)
BUN/Creatinine Ratio: 29 — ABNORMAL HIGH (ref 9–23)
BUN: 19 mg/dL (ref 6–24)
Bilirubin Total: <0.2 mg/dL (ref 0.0–1.2)
CO2: 29 mmol/L (ref 20–29)
Calcium: 8.9 mg/dL (ref 8.7–10.2)
Chloride: 102 mmol/L (ref 96–106)
Creatinine, Ser: 0.66 mg/dL (ref 0.57–1.00)
Globulin, Total: 1.8 g/dL (ref 1.5–4.5)
Glucose: 83 mg/dL (ref 70–99)
Potassium: 5.2 mmol/L (ref 3.5–5.2)
Sodium: 142 mmol/L (ref 134–144)
Total Protein: 5.8 g/dL — ABNORMAL LOW (ref 6.0–8.5)
eGFR: 104 mL/min/{1.73_m2} (ref >59)

## 2022-01-24 ENCOUNTER — Other Ambulatory Visit: Payer: Self-pay | Admitting: Family Medicine

## 2022-01-24 DIAGNOSIS — N3281 Overactive bladder: Secondary | ICD-10-CM

## 2022-01-24 DIAGNOSIS — F3170 Bipolar disorder, currently in remission, most recent episode unspecified: Secondary | ICD-10-CM

## 2022-01-24 DIAGNOSIS — K219 Gastro-esophageal reflux disease without esophagitis: Secondary | ICD-10-CM

## 2022-01-26 ENCOUNTER — Ambulatory Visit (INDEPENDENT_AMBULATORY_CARE_PROVIDER_SITE_OTHER): Payer: Medicare Other | Admitting: Family Medicine

## 2022-01-26 ENCOUNTER — Encounter: Payer: Self-pay | Admitting: Family Medicine

## 2022-01-26 VITALS — BP 120/82 | HR 73 | Temp 97.9°F | Ht 64.0 in | Wt 169.6 lb

## 2022-01-26 DIAGNOSIS — I152 Hypertension secondary to endocrine disorders: Secondary | ICD-10-CM

## 2022-01-26 DIAGNOSIS — E119 Type 2 diabetes mellitus without complications: Secondary | ICD-10-CM | POA: Diagnosis not present

## 2022-01-26 DIAGNOSIS — E559 Vitamin D deficiency, unspecified: Secondary | ICD-10-CM | POA: Diagnosis not present

## 2022-01-26 DIAGNOSIS — E1159 Type 2 diabetes mellitus with other circulatory complications: Secondary | ICD-10-CM | POA: Diagnosis not present

## 2022-01-26 DIAGNOSIS — E785 Hyperlipidemia, unspecified: Secondary | ICD-10-CM

## 2022-01-26 DIAGNOSIS — E538 Deficiency of other specified B group vitamins: Secondary | ICD-10-CM | POA: Diagnosis not present

## 2022-01-26 DIAGNOSIS — E039 Hypothyroidism, unspecified: Secondary | ICD-10-CM | POA: Diagnosis not present

## 2022-01-26 DIAGNOSIS — E1169 Type 2 diabetes mellitus with other specified complication: Secondary | ICD-10-CM | POA: Diagnosis not present

## 2022-01-26 DIAGNOSIS — Z9884 Bariatric surgery status: Secondary | ICD-10-CM

## 2022-01-26 LAB — BAYER DCA HB A1C WAIVED: HB A1C (BAYER DCA - WAIVED): 5.3 % (ref 4.8–5.6)

## 2022-01-26 NOTE — Progress Notes (Signed)
Subjective: CC:DM PCP: Janora Norlander, DO XVQ:MGQQP R Morgan [name] is a 56 y.o. female presenting to clinic today for:  1. Type 2 Diabetes (diet controlled) with hypertension, hyperlipidemia:  She continues to take metformin and supervise closely by her endocrinologist who monitors her for hypoglycemic episodes.  She notes that she has started having lows again and has gone down as low as 35.  These typically happen after she eats which is very unusual.  She was recently placed on something called dioxide and is taking half a milliliter of that daily in efforts to stabilize her blood sugars.  She is not sure that she can continue this medication however because it causes her to be so ill.  She may need to proceed to an injection therapy in a several thousand dollars per month.  She will be following up with endocrinology soon to discuss this further  Last eye exam: UTD Last foot exam: UTD Last A1c:  Lab Results  Component Value Date   HGBA1C 5.1 07/27/2021   Nephropathy screen indicated?: needs in april Last flu, zoster and/or pneumovax:  Immunization History  Administered Date(s) Administered   Influenza Inj Mdck Quad With Preservative 09/11/2017   Influenza Split 09/11/2020   Influenza,inj,Quad PF,6+ Mos 09/29/2016, 09/07/2017, 09/11/2018, 09/01/2021   Influenza,inj,quad, With Preservative 09/17/2019   Influenza-Unspecified 09/11/2016   Moderna Covid-19 Vaccine Bivalent Booster 26yr & up 11/23/2021   Moderna Sars-Covid-2 Vaccination 03/02/2020, 03/30/2020, 10/14/2020   Pneumococcal Polysaccharide-23 06/29/2016   Td 07/09/2015   Tdap 07/09/2015   Zoster Recombinat (Shingrix) 10/06/2017, 01/04/2018    ROS: No chest pain, shortness of breath, falls  2. Hypothyroidism Asymptomatic from a thyroid standpoint.  Denies any change in voice, difficulty swallowing, tremor or heart palpitations  3.  History of Roux-en-Y gastric bypass Compliant with vitamin supplementation and  recommendations as above by endocrinology.  ROS: Per HPI  Allergies  Allergen Reactions   Ketoconazole Hives and Swelling    SWELLING REACTION UNSPECIFIED    Pravachol [Pravastatin Sodium] Shortness Of Breath, Swelling and Anaphylaxis    Throat swelling   Statins Other (See Comments)    Leg cramps   Tape Dermatitis and Other (See Comments)    Steri-Strips   Codeine Hypertension    increased BP   Past Medical History:  Diagnosis Date   Allergic rhinitis    Anemia     after gastric bypass in 2018   Anxiety    Barrett's esophagus    Bipolar affective disorder (HCC)    CAP (community acquired pneumonia) 07/20/2018   Chronic respiratory failure (HCC)    Constipation, chronic    Degenerative joint disease of spine    Depression    Diabetes mellitus without complication ([place]    type 2    DM (diabetes mellitus) ([place]    "hypoglycemic" per pt - no longer takes DM meds due to weight loss from gastric bypass in 2018   Dry eye    Family history of adverse reaction to anesthesia    nausea and vomiting   Gastroparesis    GERD (gastroesophageal reflux disease)    H/O shoulder replacement 08/11/2020   left shoulder   History of bariatric surgery 03/2017   History of hiatal hernia    HLD (hyperlipidemia) 03/12/2013   Hyperlipidemia    Hypertension    No HTN meds since weight loss from Gastric Bypass in 2018   Intractable chronic migraine without aura 06/04/2015   Migraine headache    Morbid obesity ([place]  OSA (obstructive sleep apnea)    No cpap since gastric surgery   Osteoarthritis    bilateral knee   SBO (small bowel obstruction) ([place]) 03/19/2019   Sleep apnea    Unspecified hypothyroidism    Vitamin B 12 deficiency    Vitamin D deficiency     Current Outpatient Medications:    Calcium-Vitamin D-Vitamin K 650-12.5-40 MG-MCG-MCG CHEW, Chew 1 tablet by mouth 2 (two) times daily., Disp: , Rfl:    dicyclomine (BENTYL) 20 MG tablet, TAKE 1 TABLET EVERY 8 HOURS AS NEEDED FOR  ABDOMINAL CRAMPING, Disp: 90 tablet, Rfl: 0   divalproex (DEPAKOTE) 500 MG DR tablet, Take 500 mg by mouth in the morning and 1000 mg at bedtime, Disp: 270 tablet, Rfl: 3   ezetimibe (ZETIA) 10 MG tablet, Take 1 tablet (10 mg total) by mouth daily., Disp: 90 tablet, Rfl: 3   fluticasone (FLONASE) 50 MCG/ACT nasal spray, USE 2 SPRAYS IN EACH NOSTRIL TWICE DAILY AS NEEDED FOR ALLERGY, Disp: 16 g, Rfl: 4   gabapentin (NEURONTIN) 300 MG capsule, Takes 2 capsules in morning, one cap at lunch, 2 caps at bedtime, Disp: 450 capsule, Rfl: 3   Galcanezumab-gnlm (EMGALITY) 120 MG/ML SOAJ, Inject 120 mg into the skin every 30 (thirty) days., Disp: 3 mL, Rfl: 3   Glucagon, rDNA, (GLUCAGON EMERGENCY IJ), Inject 1 Syringe as directed daily as needed (emergency low blood sugar). , Disp: , Rfl:    glucose blood (ONETOUCH ULTRA) test strip, Check BS four times a day Dx E11.9, Disp: 400 strip, Rfl: 3   HYDROcodone-acetaminophen (NORCO) 5-325 MG tablet, 1-2 tabs po q6 hours prn pain, Disp: 15 tablet, Rfl: 0   levothyroxine (SYNTHROID) 112 MCG tablet, Take 1 tablet (112 mcg total) by mouth daily before breakfast., Disp: 90 tablet, Rfl: 3   LINZESS 145 MCG CAPS capsule, TAKE (1) CAPSULE DAILY BEFORE BREAKFAST., Disp: 90 capsule, Rfl: 0   loratadine (CLARITIN) 10 MG tablet, Take 10 mg by mouth daily as needed for allergies. , Disp: , Rfl:    metFORMIN (GLUCOPHAGE) 500 MG tablet, Take 500 mg by mouth daily with breakfast., Disp: , Rfl:    Multiple Vitamins-Minerals (BARIATRIC MULTIVITAMINS/IRON PO), Take 1 tablet by mouth daily., Disp: , Rfl:    MYRBETRIQ 25 MG TB24 tablet, TAKE ONE TABLET BY MOUTH EVERY DAY, Disp: 30 tablet, Rfl: 0   nystatin (NYSTATIN) powder, APPLY TO AFFECTED AREA OF GROIN RASH TWICE A DAY FOR 7 TO 10 DAYS PER FLARE, Disp: 30 g, Rfl: 1   pantoprazole (PROTONIX) 40 MG tablet, TAKE 1 TABLET DAILY, Disp: 90 tablet, Rfl: 0   PAZEO 0.7 % SOLN, Place 1 drop into both eyes every morning., Disp: , Rfl:     polyethylene glycol (MIRALAX / GLYCOLAX) packet, Take 17 g by mouth at bedtime., Disp: , Rfl:    Probiotic Product (PROBIOTIC DAILY PO), Take 1 capsule by mouth daily., Disp: , Rfl:    RESTASIS MULTIDOSE 0.05 % ophthalmic emulsion, Place 1 drop into both eyes 2 (two) times daily., Disp: , Rfl:    rizatriptan (MAXALT-MLT) 10 MG disintegrating tablet, Take 1 tablet (10 mg total) by mouth as needed (take 1 tablet as needed for migraine. may repeat in 2 hours if needed)., Disp: 10 tablet, Rfl: 2   sennosides-docusate sodium (SENOKOT-S) 8.6-50 MG tablet, Take 2 tablets by mouth daily. For constipation when taking pain medication, Disp: 30 tablet, Rfl: 1   sertraline (ZOLOFT) 100 MG tablet, TAKE 2 TABLETS DAILY AS DIRECTED, Disp:  180 tablet, Rfl: 0   Simethicone (GAS-X PO), Take by mouth., Disp: , Rfl:    tiZANidine (ZANAFLEX) 2 MG tablet, TAKE 1 TABLET EVERY 8 HOURS AS NEEDED, Disp: 30 tablet, Rfl: 3   ziprasidone (GEODON) 60 MG capsule, TAKE 1 CAPSULE AT BEDTIME, Disp: 90 capsule, Rfl: 1 Social History   Socioeconomic History   Marital status: Single    Spouse name: Not on file   Number of children: 0   Years of education: HS   Highest education level: 12th grade  Occupational History   Occupation: disabled    Fish farm manager: UNEMPLOYED  Tobacco Use   Smoking status: Never   Smokeless tobacco: Never  Vaping Use   Vaping Use: Never used  Substance and Sexual Activity   Alcohol use: No   Drug use: No   Sexual activity: Never    Birth control/protection: Post-menopausal    Comment: LMP 10/2017  Other Topics Concern   Not on file  Social History Narrative   Patient is right handed.   Patient drinks 2 glasses of caffeine daily.   Lives at home. Her niece is living with her right now.   Social Determinants of Health   Financial Resource Strain: Not on file  Food Insecurity: Not on file  Transportation Needs: Not on file  Physical Activity: Not on file  Stress: Not on file  Social  Connections: Not on file  Intimate Partner Violence: Not on file   Family History  Problem Relation Age of Onset   Asthma Father    Allergies Father    Heart disease Father        enlarged heart   Peripheral vascular disease Father    Diabetes Father    Hyperlipidemia Father    Arthritis Father    Asthma Sister    Cancer Sister        colon at 39 yr old.   Colon cancer Sister    Allergies Mother    Stroke Mother 86       with hemi paralysis   Diabetes Mother    Hyperlipidemia Mother    Hypertension Mother    GI problems Mother    Arthritis Mother    Allergies Brother    Early death Brother 63       congenital abormality   Allergies Sister    Diabetes Sister    Asthma Sister    Colon polyps Sister    Hyperlipidemia Sister    GI problems Sister        gastroporesis    Liver disease Sister        fatty liver   Stroke Sister        intercrandial bleed   Diabetes Brother    Hypertension Brother    Hyperlipidemia Brother    Heart disease Paternal Aunt    Migraines Neg Hx     Objective: Office vital signs reviewed. BP 120/82    Pulse 73    Temp 97.9 F (36.6 C)    Ht _0  (1.626 m)    Wt 169 lb 9.6 oz (76.9 kg)    LMP 10/17/2017 (Approximate)    SpO2 99%    BMI 29.11 kg/m   Physical Examination:  General: Awake, alert, nontoxic-appearing, No acute distress HEENT: Sclera white.  No goiter.  No exophthalmos. Cardio: regular rate and rhythm, S1S2 heard, no murmurs appreciated Pulm: clear to auscultation bilaterally, no wheezes, rhonchi or rales; normal work of breathing on room air Extremities: warm, well  perfused, No edema, cyanosis or clubbing; +2 pulses bilaterally MSK: Normal gait and station  Assessment/ Plan: 56 y.o. female   History of Roux-en-Y gastric bypass - Plan: VITAMIN D 25 Hydroxy (Vit-D Deficiency, Fractures), Vitamin B12, CBC  Diet-controlled diabetes mellitus ([place]) - Plan: Microalbumin / creatinine urine ratio, Bayer DCA Hb A1c Waived,  Pneumococcal conjugate vaccine 20-valent (Prevnar 20), CMP14+EGFR  Hypertension associated with diabetes ([place]) - Plan: Microalbumin / creatinine urine ratio, Lipid Panel, CMP14+EGFR  Hyperlipidemia associated with type 2 diabetes mellitus ([place]) - Plan: Lipid Panel, CMP14+EGFR  Acquired hypothyroidism - Plan: TSH, T4, Free  Vitamin D deficiency - Plan: VITAMIN D 25 Hydroxy (Vit-D Deficiency, Fractures)  Vitamin B 12 deficiency - Plan: Vitamin B12, CBC  Check vitamin levels given history of gastric bypass.  Sugar remains diet controlled of course and in fact she is dealing with intermittent hypoglycemic episodes.  It sounds like her endocrinologist has a good plan in place for her.  We will check urine microalbumin.  Pneumococcal vaccination administered.  Check renal function, liver enzymes  Blood pressure remains under good control.  No changes  Check fasting lipid panel.  Continue Zetia  Asymptomatic from a thyroid standpoint.  Check thyroid levels  No orders of the defined types were placed in this encounter.  No orders of the defined types were placed in this encounter.    Janora Norlander, DO [place] [phone]

## 2022-01-26 NOTE — Patient Instructions (Signed)
You had labs performed today.  You will be contacted with the results of the labs once they are available, usually in the next 3 business days for routine lab work.  If you have an active my chart account, they will be released to your MyChart.  If you prefer to have these labs released to you via telephone, please let us know.     

## 2022-01-27 LAB — CMP14+EGFR
ALT: 13 IU/L (ref 0–32)
AST: 22 IU/L (ref 0–40)
Albumin/Globulin Ratio: 2.2 (ref 1.2–2.2)
Albumin: 4.1 g/dL (ref 3.8–4.9)
Alkaline Phosphatase: 57 IU/L (ref 44–121)
BUN/Creatinine Ratio: 31 — ABNORMAL HIGH (ref 9–23)
BUN: 21 mg/dL (ref 6–24)
Bilirubin Total: <0.2 mg/dL (ref 0.0–1.2)
CO2: 31 mmol/L — ABNORMAL HIGH (ref 20–29)
Calcium: 8.9 mg/dL (ref 8.7–10.2)
Chloride: 98 mmol/L (ref 96–106)
Creatinine, Ser: 0.67 mg/dL (ref 0.57–1.00)
Globulin, Total: 1.9 g/dL (ref 1.5–4.5)
Glucose: 85 mg/dL (ref 70–99)
Potassium: 4.5 mmol/L (ref 3.5–5.2)
Sodium: 141 mmol/L (ref 134–144)
Total Protein: 6 g/dL (ref 6.0–8.5)
eGFR: 104 mL/min/{1.73_m2} (ref >59)

## 2022-01-27 LAB — MICROALBUMIN / CREATININE URINE RATIO
Creatinine, Urine: 122.2 mg/dL
Microalb/Creat Ratio: 4 mg/g creat (ref 0–29)
Microalbumin, Urine: 5.1 ug/mL

## 2022-01-27 LAB — LIPID PANEL
Chol/HDL Ratio: 2.8 ratio (ref 0.0–4.4)
Cholesterol, Total: 214 mg/dL — ABNORMAL HIGH (ref 100–199)
HDL: 77 mg/dL (ref >39)
LDL Chol Calc (NIH): 123 mg/dL — ABNORMAL HIGH (ref 0–99)
Triglycerides: 79 mg/dL (ref 0–149)
VLDL Cholesterol Cal: 14 mg/dL (ref 5–40)

## 2022-01-27 LAB — CBC
Hematocrit: 38.1 % (ref 34.0–46.6)
Hemoglobin: 12.5 g/dL (ref 11.1–15.9)
MCH: 30.7 pg (ref 26.6–33.0)
MCHC: 32.8 g/dL (ref 31.5–35.7)
MCV: 94 fL (ref 79–97)
Platelets: 199 10*3/uL (ref 150–450)
RBC: 4.07 x10E6/uL (ref 3.77–5.28)
RDW: 12.8 % (ref 11.7–15.4)
WBC: 3.3 10*3/uL — ABNORMAL LOW (ref 3.4–10.8)

## 2022-01-27 LAB — VITAMIN B12: Vitamin B-12: 1606 pg/mL — ABNORMAL HIGH (ref 232–1245)

## 2022-01-27 LAB — VITAMIN D 25 HYDROXY (VIT D DEFICIENCY, FRACTURES): Vit D, 25-Hydroxy: 79.6 ng/mL (ref 30.0–100.0)

## 2022-01-27 LAB — TSH: TSH: 2.83 u[IU]/mL (ref 0.450–4.500)

## 2022-01-27 LAB — T4, FREE: Free T4: 0.87 ng/dL (ref 0.82–1.77)

## 2022-01-31 DIAGNOSIS — M25512 Pain in left shoulder: Secondary | ICD-10-CM | POA: Diagnosis not present

## 2022-01-31 DIAGNOSIS — M170 Bilateral primary osteoarthritis of knee: Secondary | ICD-10-CM | POA: Diagnosis not present

## 2022-02-09 DIAGNOSIS — M25571 Pain in right ankle and joints of right foot: Secondary | ICD-10-CM | POA: Diagnosis not present

## 2022-02-21 ENCOUNTER — Other Ambulatory Visit: Payer: Self-pay | Admitting: Family Medicine

## 2022-02-21 DIAGNOSIS — N3281 Overactive bladder: Secondary | ICD-10-CM

## 2022-02-23 DIAGNOSIS — M25571 Pain in right ankle and joints of right foot: Secondary | ICD-10-CM | POA: Diagnosis not present

## 2022-03-01 DIAGNOSIS — M25571 Pain in right ankle and joints of right foot: Secondary | ICD-10-CM | POA: Diagnosis not present

## 2022-03-03 ENCOUNTER — Other Ambulatory Visit: Payer: Self-pay | Admitting: Family Medicine

## 2022-03-03 DIAGNOSIS — F3170 Bipolar disorder, currently in remission, most recent episode unspecified: Secondary | ICD-10-CM

## 2022-03-03 DIAGNOSIS — E161 Other hypoglycemia: Secondary | ICD-10-CM | POA: Diagnosis not present

## 2022-03-04 DIAGNOSIS — M25571 Pain in right ankle and joints of right foot: Secondary | ICD-10-CM | POA: Diagnosis not present

## 2022-03-14 ENCOUNTER — Ambulatory Visit (INDEPENDENT_AMBULATORY_CARE_PROVIDER_SITE_OTHER): Payer: Medicare Other

## 2022-03-14 ENCOUNTER — Telehealth: Payer: Self-pay | Admitting: Family Medicine

## 2022-03-14 VITALS — Wt 169.0 lb

## 2022-03-14 DIAGNOSIS — Z0000 Encounter for general adult medical examination without abnormal findings: Secondary | ICD-10-CM | POA: Diagnosis not present

## 2022-03-14 NOTE — Telephone Encounter (Signed)
I called the patient back. There was no answer. LVM asking the pt to either call back or send a mychart message. We have only seen the patient for migraines and there is no orders for these test in previous visits. We did notice a new referral that came through ? ?**If patient calls back, it appears that a new referral was sent to our office to complete nerve conduction studies. Since currently we do not have the capability of doing this testing our referrals team has reached out to the referring provider to let them know that they would have to send somewhere else.  ?

## 2022-03-14 NOTE — Progress Notes (Signed)
? ?Subjective:  ? Morgan [name] is a 56 y.o. female who presents for Medicare Annual (Subsequent) preventive examination. ? ?Virtual Visit via Telephone Note ? ?I connected with  Morgan [name] on 03/14/22 at  9:45 AM EDT by telephone and verified that I am speaking with the correct person using two identifiers. ? ?Location: ?Patient: Home ?Provider: [facility] ?Persons participating in the virtual visit: patient/Nurse Health Advisor ?  ?I discussed the limitations, risks, security and privacy concerns of performing an evaluation and management service by telephone and the availability of in person appointments. The patient expressed understanding and agreed to proceed. ? ?Interactive audio and video telecommunications were attempted between this nurse and patient, however failed, due to patient having technical difficulties OR patient did not have access to video capability.  We continued and completed visit with audio only. ? ?Some vital signs may be absent or patient reported.  ? ?[name], LPN  ? ?Review of Systems    ? ?Cardiac Risk Factors include: diabetes mellitus;dyslipidemia;sedentary lifestyle;Other (see comment), Risk factor comments: fatty liver, thrombocytopenia, OSA not on CPAP ? ?   ?Objective:  ?  ?Today's Vitals  ? 03/14/22 0950 03/14/22 0951  ?Weight: 169 lb (76.7 kg)   ?PainSc:  6   ? ?Body mass index is 29.01 kg/m?. ? ? ?  03/14/2022  ? 10:02 AM 09/14/2021  ? 12:24 PM 03/10/2021  ?  9:57 AM 09/29/2020  ?  8:53 AM 08/11/2020  ?  6:42 AM 08/05/2020  ? 12:19 PM 08/04/2020  ? 10:12 AM  ?Advanced Directives  ?Does Patient Have a Medical Advance Directive? Yes Yes No Yes Yes Yes Yes  ?Type of Paramedic of [place];Living will Healthcare Power of [place];Living will Living will Healthcare Power of Attorney  ?Does patient want to make changes to medical advance directive?  No - Patient declined   No - Patient declined No - Patient declined   ?Copy  of [place] in Chart? Yes - validated most recent copy scanned in chart (See row information) Yes - validated most recent copy scanned in chart (See row information)   Yes - validated most recent copy scanned in chart (See row information) No - copy requested   ?Would patient like information on creating a medical advance directive?   No - Patient declined      ? ? ?Current Medications (verified) ?Outpatient Encounter Medications as of 03/14/2022  ?Medication Sig  ? Calcium-Vitamin D-Vitamin K 650-12.5-40 MG-MCG-MCG CHEW Chew 1 tablet by mouth 2 (two) times daily.  ? dicyclomine (BENTYL) 20 MG tablet TAKE 1 TABLET EVERY 8 HOURS AS NEEDED FOR ABDOMINAL CRAMPING  ? divalproex (DEPAKOTE) 500 MG DR tablet Take 500 mg by mouth in the morning and 1000 mg at bedtime  ? ezetimibe (ZETIA) 10 MG tablet TAKE ONE TABLET ONCE DAILY  ? fluticasone (FLONASE) 50 MCG/ACT nasal spray USE 2 SPRAYS IN EACH NOSTRIL TWICE DAILY AS NEEDED FOR ALLERGY  ? gabapentin (NEURONTIN) 300 MG capsule Takes 2 capsules in morning, one cap at lunch, 2 caps at bedtime  ? Galcanezumab-gnlm (EMGALITY) 120 MG/ML SOAJ Inject 120 mg into the skin every 30 (thirty) days.  ? Glucagon, rDNA, (GLUCAGON EMERGENCY IJ) Inject 1 Syringe as directed daily as needed (emergency low blood sugar).   ? glucose blood (ONETOUCH ULTRA) test strip Check BS four times a day Dx E11.9  ? Insulin Syringe-Needle U-100 (INSULIN SYRINGE 1CC/31GX5/16") 31G X 5/16"  1 ML MISC Use 3 times daily with octreotide  ? levothyroxine (SYNTHROID) 112 MCG tablet Take 1 tablet (112 mcg total) by mouth daily before breakfast.  ? LINZESS 145 MCG CAPS capsule TAKE (1) CAPSULE DAILY BEFORE BREAKFAST.  ? loratadine (CLARITIN) 10 MG tablet Take 10 mg by mouth daily as needed for allergies.   ? Multiple Vitamins-Minerals (BARIATRIC MULTIVITAMINS/IRON PO) Take 1 tablet by mouth daily.  ? MYRBETRIQ 25 MG TB24 tablet TAKE ONE TABLET BY MOUTH EVERY DAY  ? nystatin (NYSTATIN) powder APPLY  TO AFFECTED AREA OF GROIN RASH TWICE A DAY FOR 7 TO 10 DAYS PER FLARE  ? pantoprazole (PROTONIX) 40 MG tablet TAKE 1 TABLET DAILY  ? PAZEO 0.7 % SOLN Place 1 drop into both eyes every morning.  ? polyethylene glycol (MIRALAX / GLYCOLAX) packet Take 17 g by mouth at bedtime.  ? Probiotic Product (PROBIOTIC DAILY PO) Take 1 capsule by mouth daily.  ? rizatriptan (MAXALT-MLT) 10 MG disintegrating tablet Take 1 tablet (10 mg total) by mouth as needed (take 1 tablet as needed for migraine. may repeat in 2 hours if needed).  ? sertraline (ZOLOFT) 100 MG tablet TAKE 2 TABLETS DAILY AS DIRECTED  ? Simethicone (GAS-X PO) Take by mouth.  ? tiZANidine (ZANAFLEX) 2 MG tablet TAKE 1 TABLET EVERY 8 HOURS AS NEEDED  ? ziprasidone (GEODON) 60 MG capsule TAKE 1 CAPSULE AT BEDTIME  ? [DISCONTINUED] diazoxide (PROGLYCEM) 50 MG/ML suspension Take by mouth. (Patient not taking: Reported on 03/14/2022)  ? [DISCONTINUED] HYDROcodone-acetaminophen (NORCO) 5-325 MG tablet 1-2 tabs po q6 hours prn pain (Patient not taking: Reported on 03/14/2022)  ? [DISCONTINUED] metFORMIN (GLUCOPHAGE) 500 MG tablet Take 500 mg by mouth daily with breakfast. (Patient not taking: Reported on 03/14/2022)  ? [DISCONTINUED] Octreotide Acetate 200 MCG/ML SOLN  (Patient not taking: Reported on 03/14/2022)  ? [DISCONTINUED] RESTASIS MULTIDOSE 0.05 % ophthalmic emulsion Place 1 drop into both eyes 2 (two) times daily. (Patient not taking: Reported on 03/14/2022)  ? [DISCONTINUED] sennosides-docusate sodium (SENOKOT-S) 8.6-50 MG tablet Take 2 tablets by mouth daily. For constipation when taking pain medication (Patient not taking: Reported on 03/14/2022)  ? ?No facility-administered encounter medications on file as of 03/14/2022.  ? ? ?Allergies (verified) ?Ketoconazole, Pravachol [pravastatin sodium], Statins, Tape, and Codeine  ? ?History: ?Past Medical History:  ?Diagnosis Date  ? Allergic rhinitis   ? Anemia   ?  after gastric bypass in 2018  ? Anxiety   ? Barrett's esophagus    ? Bipolar affective disorder ([place])   ? CAP (community acquired pneumonia) 07/20/2018  ? Chronic respiratory failure ([place])   ? Constipation, chronic   ? Degenerative joint disease of spine   ? Depression   ? Diabetes mellitus without complication ([place])   ? type 2   ? DM (diabetes mellitus) ([place])   ? "hypoglycemic" per pt - no longer takes DM meds due to weight loss from gastric bypass in 2018  ? Dry eye   ? Family history of adverse reaction to anesthesia   ? nausea and vomiting  ? Gastroparesis   ? GERD (gastroesophageal reflux disease)   ? H/O shoulder replacement 08/11/2020  ? left shoulder  ? History of bariatric surgery 03/2017  ? History of hiatal hernia   ? HLD (hyperlipidemia) 03/12/2013  ? Hyperlipidemia   ? Hypertension   ? No HTN meds since weight loss from Gastric Bypass in 2018  ? Intractable chronic migraine without aura 06/04/2015  ? Migraine headache   ?  Morbid obesity ([place])   ? OSA (obstructive sleep apnea)   ? No cpap since gastric surgery  ? Osteoarthritis   ? bilateral knee  ? SBO (small bowel obstruction) ([place]) 03/19/2019  ? Sleep apnea   ? Unspecified hypothyroidism   ? Vitamin B 12 deficiency   ? Vitamin D deficiency   ? ?Past Surgical History:  ?Procedure Laterality Date  ? ANKLE ARTHROSCOPY WITH RECONSTRUCTION Right 09/24/2019  ? Procedure: ANKLE ARTHROSCOPY DEBRIDEMENT TREATMENT OF OSTEOCHONDRAL LESION TALUS. PRONEAL TENDON DEBRIDEMENT;  Surgeon: Erle Crocker, MD;  Location: [facility];  Service: Orthopedics;  Laterality: Right;  SURGERY REQUEST TIME 2 HOURS  ? APPENDECTOMY  1978  ? CHOLECYSTECTOMY  2005  ? COLONOSCOPY    ? disc repair  05/17/2019  ? with rods, lumbar spine  ? GASTRIC ROUX-EN-Y N/A 03/21/2017  ? Procedure: LAPAROSCOPIC ROUX-EN-Y GASTRIC, UPPER ENDO;  Surgeon: Greer Pickerel, MD;  Location: [facility];  Service: General;  Laterality: N/A;  ? GASTROSTOMY N/A 06/19/2018  ? Procedure: LAPRASCOPIC INSERTION OF GASTROSTOMY TUBE;  Surgeon: Greer Pickerel, MD;  Location: [facility];  Service: General;  Laterality: N/A;  ? GASTROSTOMY TUBE PLACEMENT Left   ? 06/2018  ? HERNIA REPAIR    ? HIATAL HERNIA REPAIR N/A 06/19/2018  ? Procedure: LAPAROSCOPIC REPAIR OF HIATAL HERNIA;  Surg

## 2022-03-14 NOTE — Patient Instructions (Signed)
Morgan [name] , ?Thank you for taking time to come for your Medicare Wellness Visit. I appreciate your ongoing commitment to your health goals. Please review the following plan we discussed and let me know if I can assist you in the future.  ? ?Screening recommendations/referrals: ?Colonoscopy: Done 12/15/2020 - Repeat in 5 years ?Mammogram: Done 12/2021 @ [facility] - we need records - Repeat annually  ?Bone Density: Due at age 56 ?Recommended yearly ophthalmology/optometry visit for glaucoma screening and checkup ?Recommended yearly dental visit for hygiene and checkup ? ?Vaccinations: ?Influenza vaccine: Done 09/01/2021 - Repeat annually ?Pneumococcal vaccine: Done 06/29/2016 & 01/26/2022 ?Tdap vaccine: Done 07/09/2015 - Repeat in 10 years ?Shingles vaccine: Done 10/06/2017 & 01/04/2018  ?Covid-19: Done 03/02/2020, 03/30/2020, 10/14/2020, & 11/23/2021 ? ?Advanced directives: in chart ? ?Conditions/risks identified: Aim for 30 minutes of exercise at least 5 days per week as well as 6-8 glasses of water, and 5 servings of fruits and vegetables each day.  ? ?Next appointment: Follow up in one year for your annual wellness visit.  ? ?Preventive Care 40-64 Years, Female ?Preventive care refers to lifestyle choices and visits with your health care provider that can promote health and wellness. ?What does preventive care include? ?A yearly physical exam. This is also called an annual well check. ?Dental exams once or twice a year. ?Routine eye exams. Ask your health care provider how often you should have your eyes checked. ?Personal lifestyle choices, including: ?Daily care of your teeth and gums. ?Regular physical activity. ?Eating a healthy diet. ?Avoiding tobacco and drug use. ?Limiting alcohol use. ?Practicing safe sex. ?Taking low-dose aspirin daily starting at age 95. ?Taking vitamin and mineral supplements as recommended by your health care provider. ?What happens during an annual well check? ?The services and  screenings done by your health care provider during your annual well check will depend on your age, overall health, lifestyle risk factors, and family history of disease. ?Counseling  ?Your health care provider may ask you questions about your: ?Alcohol use. ?Tobacco use. ?Drug use. ?Emotional well-being. ?Home and relationship well-being. ?Sexual activity. ?Eating habits. ?Work and work Statistician. ?Method of birth control. ?Menstrual cycle. ?Pregnancy history. ?Screening  ?You may have the following tests or measurements: ?Height, weight, and BMI. ?Blood pressure. ?Lipid and cholesterol levels. These may be checked every 5 years, or more frequently if you are over 86 years old. ?Skin check. ?Lung cancer screening. You may have this screening every year starting at age 22 if you have a 30-pack-year history of smoking and currently smoke or have quit within the past 15 years. ?Fecal occult blood test (FOBT) of the stool. You may have this test every year starting at age 48. ?Flexible sigmoidoscopy or colonoscopy. You may have a sigmoidoscopy every 5 years or a colonoscopy every 10 years starting at age 21. ?Hepatitis C blood test. ?Hepatitis B blood test. ?Sexually transmitted disease (STD) testing. ?Diabetes screening. This is done by checking your blood sugar (glucose) after you have not eaten for a while (fasting). You may have this done every 1-3 years. ?Mammogram. This may be done every 1-2 years. Talk to your health care provider about when you should start having regular mammograms. This may depend on whether you have a family history of breast cancer. ?BRCA-related cancer screening. This may be done if you have a family history of breast, ovarian, tubal, or peritoneal cancers. ?Pelvic exam and Pap test. This may be done every 3 years starting at age 33. Starting  at age 13, this may be done every 5 years if you have a Pap test in combination with an HPV test. ?Bone density scan. This is done to screen for  osteoporosis. You may have this scan if you are at high risk for osteoporosis. ?Discuss your test results, treatment options, and if necessary, the need for more tests with your health care provider. ?Vaccines  ?Your health care provider may recommend certain vaccines, such as: ?Influenza vaccine. This is recommended every year. ?Tetanus, diphtheria, and acellular pertussis (Tdap, Td) vaccine. You may need a Td booster every 10 years. ?Zoster vaccine. You may need this after age 2. ?Pneumococcal 13-valent conjugate (PCV13) vaccine. You may need this if you have certain conditions and were not previously vaccinated. ?Pneumococcal polysaccharide (PPSV23) vaccine. You may need one or two doses if you smoke cigarettes or if you have certain conditions. ?Talk to your health care provider about which screenings and vaccines you need and how often you need them. ?This information is not intended to replace advice given to you by your health care provider. Make sure you discuss any questions you have with your health care provider. ?Document Released: 12/25/2015 Document Revised: 08/17/2016 Document Reviewed: 09/29/2015 ?Elsevier Interactive Patient Education ? 2017 [employer] ? ? ? ?Fall Prevention in the Home ?Falls can cause injuries. They can happen to people of all ages. There are many things you can do to make your home safe and to help prevent falls. ?What can I do on the outside of my home? ?Regularly fix the edges of walkways and driveways and fix any cracks. ?Remove anything that might make you trip as you walk through a door, such as a raised step or threshold. ?Trim any bushes or trees on the path to your home. ?Use bright outdoor lighting. ?Clear any walking paths of anything that might make someone trip, such as rocks or tools. ?Regularly check to see if handrails are loose or broken. Make sure that both sides of any steps have handrails. ?Any raised decks and porches should have guardrails on the  edges. ?Have any leaves, snow, or ice cleared regularly. ?Use sand or salt on walking paths during winter. ?Clean up any spills in your garage right away. This includes oil or grease spills. ?What can I do in the bathroom? ?Use night lights. ?Install grab bars by the toilet and in the tub and shower. Do not use towel bars as grab bars. ?Use non-skid mats or decals in the tub or shower. ?If you need to sit down in the shower, use a plastic, non-slip stool. ?Keep the floor dry. Clean up any water that spills on the floor as soon as it happens. ?Remove soap buildup in the tub or shower regularly. ?Attach bath mats securely with double-sided non-slip rug tape. ?Do not have throw rugs and other things on the floor that can make you trip. ?What can I do in the bedroom? ?Use night lights. ?Make sure that you have a light by your bed that is easy to reach. ?Do not use any sheets or blankets that are too big for your bed. They should not hang down onto the floor. ?Have a firm chair that has side arms. You can use this for support while you get dressed. ?Do not have throw rugs and other things on the floor that can make you trip. ?What can I do in the kitchen? ?Clean up any spills right away. ?Avoid walking on wet floors. ?Keep items that you use a  lot in easy-to-reach places. ?If you need to reach something above you, use a strong step stool that has a grab bar. ?Keep electrical cords out of the way. ?Do not use floor polish or wax that makes floors slippery. If you must use wax, use non-skid floor wax. ?Do not have throw rugs and other things on the floor that can make you trip. ?What can I do with my stairs? ?Do not leave any items on the stairs. ?Make sure that there are handrails on both sides of the stairs and use them. Fix handrails that are broken or loose. Make sure that handrails are as long as the stairways. ?Check any carpeting to make sure that it is firmly attached to the stairs. Fix any carpet that is loose or  worn. ?Avoid having throw rugs at the top or bottom of the stairs. If you do have throw rugs, attach them to the floor with carpet tape. ?Make sure that you have a light switch at the top of the stairs and the bottom

## 2022-03-14 NOTE — Telephone Encounter (Signed)
Patient left a voicemail on my phone wanting to know when a MRI of both her legs can be scheduled then she asked about a nerve study?? I am not sure because I don't see anything.  ?

## 2022-04-19 ENCOUNTER — Other Ambulatory Visit: Payer: Self-pay | Admitting: Family Medicine

## 2022-04-19 DIAGNOSIS — F3170 Bipolar disorder, currently in remission, most recent episode unspecified: Secondary | ICD-10-CM

## 2022-04-20 ENCOUNTER — Other Ambulatory Visit: Payer: Self-pay | Admitting: Family Medicine

## 2022-04-30 ENCOUNTER — Other Ambulatory Visit: Payer: Self-pay | Admitting: Family Medicine

## 2022-04-30 DIAGNOSIS — E039 Hypothyroidism, unspecified: Secondary | ICD-10-CM

## 2022-05-04 ENCOUNTER — Other Ambulatory Visit: Payer: Self-pay | Admitting: Family Medicine

## 2022-05-04 DIAGNOSIS — K219 Gastro-esophageal reflux disease without esophagitis: Secondary | ICD-10-CM

## 2022-05-10 ENCOUNTER — Other Ambulatory Visit: Payer: Self-pay | Admitting: Family Medicine

## 2022-05-18 DIAGNOSIS — G629 Polyneuropathy, unspecified: Secondary | ICD-10-CM | POA: Diagnosis not present

## 2022-05-23 DIAGNOSIS — M25571 Pain in right ankle and joints of right foot: Secondary | ICD-10-CM | POA: Diagnosis not present

## 2022-06-01 DIAGNOSIS — Z8639 Personal history of other endocrine, nutritional and metabolic disease: Secondary | ICD-10-CM | POA: Diagnosis not present

## 2022-06-01 DIAGNOSIS — E161 Other hypoglycemia: Secondary | ICD-10-CM | POA: Diagnosis not present

## 2022-06-01 DIAGNOSIS — Z9884 Bariatric surgery status: Secondary | ICD-10-CM | POA: Diagnosis not present

## 2022-06-01 DIAGNOSIS — K5909 Other constipation: Secondary | ICD-10-CM | POA: Diagnosis not present

## 2022-06-09 DIAGNOSIS — M5416 Radiculopathy, lumbar region: Secondary | ICD-10-CM | POA: Diagnosis not present

## 2022-06-15 DIAGNOSIS — M5126 Other intervertebral disc displacement, lumbar region: Secondary | ICD-10-CM | POA: Diagnosis not present

## 2022-06-15 DIAGNOSIS — M5416 Radiculopathy, lumbar region: Secondary | ICD-10-CM | POA: Diagnosis not present

## 2022-06-15 DIAGNOSIS — M5136 Other intervertebral disc degeneration, lumbar region: Secondary | ICD-10-CM | POA: Diagnosis not present

## 2022-06-15 DIAGNOSIS — M48061 Spinal stenosis, lumbar region without neurogenic claudication: Secondary | ICD-10-CM | POA: Diagnosis not present

## 2022-06-22 ENCOUNTER — Ambulatory Visit: Payer: Medicare Other | Attending: Neurosurgery | Admitting: Physical Therapy

## 2022-06-22 ENCOUNTER — Other Ambulatory Visit: Payer: Self-pay

## 2022-06-22 DIAGNOSIS — M5459 Other low back pain: Secondary | ICD-10-CM | POA: Insufficient documentation

## 2022-06-22 NOTE — Therapy (Signed)
OUTPATIENT PHYSICAL THERAPY THORACOLUMBAR EVALUATION   Patient Name: Morgan [name] MRN: [id number] DOB:1966-09-21, 56 y.o., female Today's Date: 06/22/2022   PT End of Session - 06/22/22 0855     Visit Number 1    Number of Visits 12    Date for PT Re-Evaluation 08/03/22    Authorization Type FOTO AT LEAST EVERY 5TH VISIT.  PROGRESS NOTE AT 10TH VISIT.  KX MODIFIER AFTER 15 VISITS.    PT Start Time [phone]    PT Stop Time [phone]    PT Time Calculation (min) 41 min    Activity Tolerance Patient tolerated treatment well    Behavior During Therapy [facility] for tasks assessed/performed             Past Medical History:  Diagnosis Date   Allergic rhinitis    Anemia     after gastric bypass in 2018   Anxiety    Barrett's esophagus    Bipolar affective disorder ([place])    CAP (community acquired pneumonia) 07/20/2018   Chronic respiratory failure (HCC)    Constipation, chronic    Degenerative joint disease of spine    Depression    Diabetes mellitus without complication ([place])    type 2    DM (diabetes mellitus) ([place])    "hypoglycemic" per pt - no longer takes DM meds due to weight loss from gastric bypass in 2018   Dry eye    Family history of adverse reaction to anesthesia    nausea and vomiting   Gastroparesis    GERD (gastroesophageal reflux disease)    H/O shoulder replacement 08/11/2020   left shoulder   History of bariatric surgery 03/2017   History of hiatal hernia    HLD (hyperlipidemia) 03/12/2013   Hyperlipidemia    Hypertension    No HTN meds since weight loss from Gastric Bypass in 2018   Intractable chronic migraine without aura 06/04/2015   Migraine headache    Morbid obesity (HCC)    OSA (obstructive sleep apnea)    No cpap since gastric surgery   Osteoarthritis    bilateral knee   SBO (small bowel obstruction) ([place]) 03/19/2019   Sleep apnea    Unspecified hypothyroidism    Vitamin B 12 deficiency    Vitamin D deficiency    Past Surgical History:   Procedure Laterality Date   ANKLE ARTHROSCOPY WITH RECONSTRUCTION Right 09/24/2019   Procedure: ANKLE ARTHROSCOPY DEBRIDEMENT TREATMENT OF OSTEOCHONDRAL LESION TALUS. PRONEAL TENDON DEBRIDEMENT;  Surgeon: Erle Crocker, MD;  Location: [facility];  Service: Orthopedics;  Laterality: Right;  SURGERY REQUEST TIME 2 HOURS   APPENDECTOMY  1978   CHOLECYSTECTOMY  2005   COLONOSCOPY     disc repair  05/17/2019   with rods, lumbar spine   GASTRIC ROUX-EN-Y N/A 03/21/2017   Procedure: LAPAROSCOPIC ROUX-EN-Y GASTRIC, UPPER ENDO;  Surgeon: Greer Pickerel, MD;  Location: [facility];  Service: General;  Laterality: N/A;   GASTROSTOMY N/A 06/19/2018   Procedure: LAPRASCOPIC INSERTION OF GASTROSTOMY TUBE;  Surgeon: Greer Pickerel, MD;  Location: [facility];  Service: General;  Laterality: N/A;   GASTROSTOMY TUBE PLACEMENT Left    06/2018   HERNIA REPAIR     HIATAL HERNIA REPAIR N/A 06/19/2018   Procedure: LAPAROSCOPIC REPAIR OF HIATAL HERNIA;  Surgeon: Greer Pickerel, MD;  Location: [facility];  Service: General;  Laterality: N/A;   LAPAROSCOPIC LYSIS OF ADHESIONS  03/19/2019   Dr. Romana Juniper   LAPAROSCOPY N/A 06/19/2018   Procedure: LAPAROSCOPY  DIAGNOSTIC;  Surgeon: Greer Pickerel, MD;  Location: [facility];  Service: General;  Laterality: N/A;   LAPAROSCOPY N/A 03/19/2019   Procedure: LAPAROSCOPY DIAGNOSTIC lysis of adhesions;  Surgeon: Clovis Riley, MD;  Location: [facility];  Service: General;  Laterality: N/A;   LUMBAR LAMINECTOMY/DECOMPRESSION MICRODISCECTOMY Right 10/11/2018   Procedure: LAMINECTOMY AND FORAMINOTOMY RIGHT LUMBAR FOUR- LUMBAR FIVE;  Surgeon: Consuella Lose, MD;  Location: [facility];  Service: Neurosurgery;  Laterality: Right;   MASS EXCISION Right 09/23/2021   Procedure: EXCISION MASS VOLAR ASPECT RIGHT HAND;  Surgeon: Leanora Cover, MD;  Location: [facility];  Service: Orthopedics;  Laterality: Right;  45 MIN   SHOULDER ARTHROSCOPY  7/12   left-dsc   TONSILLECTOMY  at  age 26   TOTAL SHOULDER ARTHROPLASTY Left 08/11/2020   Procedure: TOTAL SHOULDER ARTHROPLASTY;  Surgeon: Marchia Bond, MD;  Location: [facility];  Service: Orthopedics;  Laterality: Left;   TRIGGER FINGER RELEASE  12/20/2012   Procedure: RELEASE TRIGGER FINGER/A-1 PULLEY;  Surgeon: Tennis Must, MD;  Location: [facility];  Service: Orthopedics;  Laterality: Left;  LEFT TRIGGER THUMB RELEASE   TRIGGER FINGER RELEASE Right 09/23/2021   Procedure: RELEASE A-1 PULLEY RIGHT INDEX FINGER;  Surgeon: Leanora Cover, MD;  Location: [facility];  Service: Orthopedics;  Laterality: Right;   Patient Active Problem List   Diagnosis Date Noted   Osteoarthritis of left shoulder 08/11/2020   S/P shoulder replacement, left 08/11/2020   AMS (altered mental status) 05/19/2019   Thrombocytopenia ([place]) 05/19/2019   SBO (small bowel obstruction) s/p lap LOA 03/19/2019 03/19/2019   Reactive hypoglycemia 02/12/2019   Chronic migraine without aura, with intractable migraine, so stated, with status migrainosus 11/20/2018   Lumbar radiculopathy 10/11/2018   Atelectasis    Hypoglycemia    Hypotension    Bradycardia    Epigastric pain 06/19/2018   History of adenomatous polyp of colon 10/23/2017   Hyperlipidemia associated with type 2 diabetes mellitus ([place]) 07/06/2017   GERD (gastroesophageal reflux disease) 03/21/2017   History of Roux-en-Y gastric bypass 2018 03/21/2017   Morbid obesity with BMI of 40.0-44.9, adult ([place]) 06/06/2016   Intractable chronic migraine without aura 06/04/2015   Abnormal uterine bleeding 05/13/2015   Pancreatitis 02/27/2014   Fatty liver disease, nonalcoholic 36/62/9476   Bipolar disorder ([place]) 54/65/0354   Metabolic syndrome 65/68/1275   Vitamin D deficiency    DM (diabetes mellitus) ([place]) 03/12/2013   Surgery, elective 06/12/2011   Hypothyroidism    Constipation, chronic    Vitamin B 12 deficiency    ALLERGIC RHINITIS 12/31/2010    BARRETTS ESOPHAGUS 12/31/2010   Gastroparesis 12/31/2010   Bilateral chronic knee pain 12/31/2010   Obstructive sleep apnea 09/27/2010   Migraine 09/27/2010   RESPIRATORY FAILURE, CHRONIC 09/27/2010   Cyst of ovary 10/19/2009    REFERRING PROVIDER: Consuella Lose MD  REFERRING DIAG: Lumbar radiculopathy  Rationale for Evaluation and Treatment Rehabilitation  THERAPY DIAG:  Other low back pain  ONSET DATE: January 2023.  SUBJECTIVE:  SUBJECTIVE STATEMENT: The patient presents to the clinic today with c/o left-sided low back pain with radiation of pain and tingling into her feet.  The pain came on for no apparent reason.  Sometimes pain and sitting decrease her pain but she states she has to weight shift a lot.  If she sits too long, however, her pain increases.  Prolonged walking and standing increase her pain as well.   PERTINENT HISTORY:  2 prior lumbar surgeries, Bi-polar, DJD, DM, HTN, right ankle surgery, hernia repair, left TSA.  PAIN:  Are you having pain? Yes: NPRS scale: 8/10 Pain location: Left LB and into LE's. Pain description: Ache, sore, throbbing, numb, shooting.   PRECAUTIONS: None  WEIGHT BEARING RESTRICTIONS No  FALLS:  Has patient fallen in last 6 months? No  LIVING ENVIRONMENT: Lives in: House/apartment Has following equipment at home: None  PLOF: Independent  PATIENT GOALS:  Reduce pain and avoid surgery.   OBJECTIVE:   PATIENT SURVEYS:  FOTO Complete.   POSTURE: No Significant postural limitations  PALPATION: Tender to palpation over left lower lumbar region.  LUMBAR ROM:   WFL for active lumbar flexion and extension.  LOWER EXTREMITY ROM:       LOWER EXTREMITY MMT:  Decreased dorsiflexion on left graded at 4/5.   LUMBAR SPECIAL TESTS: Equal  leg lengths, (-) SLR and FABER testing.   LE DTR's are normal.    GAIT: WNL.   TODAY'S TREATMENT  HMP and IFC at 80-150 Hz on 40% scan x 15 minutes to patient's left low back.  ASSESSMENT:  CLINICAL IMPRESSION: The patient presents to [facility] with c/o left-sided low back pain that has been ongoing since January of this year.  She is experiencing pain in both feet.  Her DTR's are intact.  Her left dorsiflexion is weaker per contralateral comparison. Special testing is negative.  Patient unable to perform ADL's like she did before her exacerbation due to very high pain-levels.  She is tender to palpation over her left lower lumbar region.  Patient will benefit from skilled physical therapy intervention to address pain and deficits.    OBJECTIVE IMPAIRMENTS decreased activity tolerance, increased muscle spasms, and pain.   ACTIVITY LIMITATIONS carrying, lifting, bending, sitting, and standing  PARTICIPATION LIMITATIONS: meal prep, cleaning, laundry, and yard work  PERSONAL FACTORS 1-2 comorbidities: Previous lumbar surgeries  are also affecting patient's functional outcome.   REHAB POTENTIAL: Good  CLINICAL DECISION MAKING: Stable/uncomplicated  EVALUATION COMPLEXITY: Low     LONG TERM GOALS: Target date: 08/03/2022  Ind with HEP. Baseline:  Goal status:  INITIAL 2.  Perform ADL's with pain not > 3-4/10. Baseline:  Goal status: INITIAL  3.  Eliminate LE symptoms. Baseline:  Goal status: INITIAL   PLAN: PT FREQUENCY: 2x/week  PT DURATION: 6 weeks  PLANNED INTERVENTIONS: Therapeutic exercises, Therapeutic activity, Patient/Family education, Cognitive remediation, Electrical stimulation, Moist heat, Ultrasound, and Manual therapy.  PLAN FOR NEXT SESSION: STW/M and modalites as needed.  Core exercise progression.   Lyndee Herbst, [place], PT 06/22/2022, 9:14 AM

## 2022-06-28 ENCOUNTER — Ambulatory Visit: Payer: Medicare Other | Admitting: *Deleted

## 2022-06-28 ENCOUNTER — Encounter: Payer: Self-pay | Admitting: *Deleted

## 2022-06-28 DIAGNOSIS — M5459 Other low back pain: Secondary | ICD-10-CM

## 2022-06-28 NOTE — Therapy (Signed)
OUTPATIENT PHYSICAL THERAPY THORACOLUMBAR TREATMENT   Patient Name: Morgan [name] MRN: [id number] DOB:January 13, 1966, 56 y.o., female Today's Date: 06/28/2022   PT End of Session - 06/28/22 0806     Visit Number 2    Number of Visits 12    Date for PT Re-Evaluation 08/03/22    Authorization Type FOTO AT LEAST EVERY 5TH VISIT.  PROGRESS NOTE AT 10TH VISIT.  KX MODIFIER AFTER 15 VISITS.    PT Start Time 0815    PT Stop Time 0905    PT Time Calculation (min) 50 min             Past Medical History:  Diagnosis Date   Allergic rhinitis    Anemia     after gastric bypass in 2018   Anxiety    Barrett's esophagus    Bipolar affective disorder ([place])    CAP (community acquired pneumonia) 07/20/2018   Chronic respiratory failure (HCC)    Constipation, chronic    Degenerative joint disease of spine    Depression    Diabetes mellitus without complication ([place])    type 2    DM (diabetes mellitus) ([place])    "hypoglycemic" per pt - no longer takes DM meds due to weight loss from gastric bypass in 2018   Dry eye    Family history of adverse reaction to anesthesia    nausea and vomiting   Gastroparesis    GERD (gastroesophageal reflux disease)    H/O shoulder replacement 08/11/2020   left shoulder   History of bariatric surgery 03/2017   History of hiatal hernia    HLD (hyperlipidemia) 03/12/2013   Hyperlipidemia    Hypertension    No HTN meds since weight loss from Gastric Bypass in 2018   Intractable chronic migraine without aura 06/04/2015   Migraine headache    Morbid obesity (HCC)    OSA (obstructive sleep apnea)    No cpap since gastric surgery   Osteoarthritis    bilateral knee   SBO (small bowel obstruction) ([place]) 03/19/2019   Sleep apnea    Unspecified hypothyroidism    Vitamin B 12 deficiency    Vitamin D deficiency    Past Surgical History:  Procedure Laterality Date   ANKLE ARTHROSCOPY WITH RECONSTRUCTION Right 09/24/2019   Procedure: ANKLE ARTHROSCOPY  DEBRIDEMENT TREATMENT OF OSTEOCHONDRAL LESION TALUS. PRONEAL TENDON DEBRIDEMENT;  Surgeon: Erle Crocker, MD;  Location: [facility];  Service: Orthopedics;  Laterality: Right;  SURGERY REQUEST TIME 2 HOURS   APPENDECTOMY  1978   CHOLECYSTECTOMY  2005   COLONOSCOPY     disc repair  05/17/2019   with rods, lumbar spine   GASTRIC ROUX-EN-Y N/A 03/21/2017   Procedure: LAPAROSCOPIC ROUX-EN-Y GASTRIC, UPPER ENDO;  Surgeon: Greer Pickerel, MD;  [facility];  Service: General;  Laterality: N/A;   GASTROSTOMY N/A 06/19/2018   Procedure: LAPRASCOPIC INSERTION OF GASTROSTOMY TUBE;  Surgeon: Greer Pickerel, MD;  [facility]: [facility];  Service: General;  Laterality: N/A;   GASTROSTOMY TUBE PLACEMENT Left    06/2018   HERNIA REPAIR     HIATAL HERNIA REPAIR N/A 06/19/2018   Procedure: LAPAROSCOPIC REPAIR OF HIATAL HERNIA;  Surgeon: Greer Pickerel, MD;  Location: [facility];  Service: General;  Laterality: N/A;   LAPAROSCOPIC LYSIS OF ADHESIONS  03/19/2019   Dr. Romana Juniper   LAPAROSCOPY N/A 06/19/2018   Procedure: LAPAROSCOPY DIAGNOSTIC;  Surgeon: Greer Pickerel, MD;  [facility];  Service: General;  Laterality: N/A;   LAPAROSCOPY  N/A 03/19/2019   Procedure: LAPAROSCOPY DIAGNOSTIC lysis of adhesions;  Surgeon: Clovis Riley, MD;  [facility];  Service: General;  Laterality: N/A;   LUMBAR LAMINECTOMY/DECOMPRESSION MICRODISCECTOMY Right 10/11/2018   Procedure: LAMINECTOMY AND FORAMINOTOMY RIGHT LUMBAR FOUR- LUMBAR FIVE;  Surgeon: Consuella Lose, MD;  Location: [facility];  Service: Neurosurgery;  Laterality: Right;   MASS EXCISION Right 09/23/2021   Procedure: EXCISION MASS VOLAR ASPECT RIGHT HAND;  Surgeon: Leanora Cover, MD;  Location: [facility];  Service: Orthopedics;  Laterality: Right;  45 MIN   SHOULDER ARTHROSCOPY  7/12   left-dsc   TONSILLECTOMY  at age 21   TOTAL SHOULDER ARTHROPLASTY Left 08/11/2020   Procedure: TOTAL SHOULDER ARTHROPLASTY;  Surgeon: Marchia Bond, MD;  Location: [facility];  Service: Orthopedics;  Laterality: Left;   TRIGGER FINGER RELEASE  12/20/2012   Procedure: RELEASE TRIGGER FINGER/A-1 PULLEY;  Surgeon: Tennis Must, MD;  Location: [facility];  Service: Orthopedics;  Laterality: Left;  LEFT TRIGGER THUMB RELEASE   TRIGGER FINGER RELEASE Right 09/23/2021   Procedure: RELEASE A-1 PULLEY RIGHT INDEX FINGER;  Surgeon: Leanora Cover, MD;  Location: [facility];  Service: Orthopedics;  Laterality: Right;   Patient Active Problem List   Diagnosis Date Noted   Osteoarthritis of left shoulder 08/11/2020   S/P shoulder replacement, left 08/11/2020   AMS (altered mental status) 05/19/2019   Thrombocytopenia ([place]) 05/19/2019   SBO (small bowel obstruction) s/p lap LOA 03/19/2019 03/19/2019   Reactive hypoglycemia 02/12/2019   Chronic migraine without aura, with intractable migraine, so stated, with status migrainosus 11/20/2018   Lumbar radiculopathy 10/11/2018   Atelectasis    Hypoglycemia    Hypotension    Bradycardia    Epigastric pain 06/19/2018   History of adenomatous polyp of colon 10/23/2017   Hyperlipidemia associated with type 2 diabetes mellitus ([place]) 07/06/2017   GERD (gastroesophageal reflux disease) 03/21/2017   History of Roux-en-Y gastric bypass 2018 03/21/2017   Morbid obesity with BMI of 40.0-44.9, adult ([place]) 06/06/2016   Intractable chronic migraine without aura 06/04/2015   Abnormal uterine bleeding 05/13/2015   Pancreatitis 02/27/2014   Fatty liver disease, nonalcoholic 41/63/8453   Bipolar disorder ([place]) 64/68/0321   Metabolic syndrome 22/48/2500   Vitamin D deficiency    DM (diabetes mellitus) ([place]) 03/12/2013   Surgery, elective 06/12/2011   Hypothyroidism    Constipation, chronic    Vitamin B 12 deficiency    ALLERGIC RHINITIS 12/31/2010   BARRETTS ESOPHAGUS 12/31/2010   Gastroparesis 12/31/2010   Bilateral chronic knee pain 12/31/2010   Obstructive  sleep apnea 09/27/2010   Migraine 09/27/2010   RESPIRATORY FAILURE, CHRONIC 09/27/2010   Cyst of ovary 10/19/2009    REFERRING PROVIDER: Consuella Lose MD  REFERRING DIAG: Lumbar radiculopathy  Rationale for Evaluation and Treatment Rehabilitation  THERAPY DIAG:  Other low back pain  ONSET DATE: January 2023.  SUBJECTIVE:  SUBJECTIVE STATEMENT:        LB not doing well 8/10. Lumbar fusion was L4-5. MRI showed L3 buldging. Injection 07-15-22 l.   PERTINENT HISTORY:  2 prior lumbar surgeries, Bi-polar, DJD, DM, HTN, right ankle surgery, hernia repair, left TSA.  PAIN:  Are you having pain? Yes: NPRS scale: 8/10 Pain location: Left LB and into LE's. Pain description: Ache, sore, throbbing, numb, shooting.   PRECAUTIONS: None   OBJECTIVE:      TODAY'S TREATMENT :                                           EXERCISE LOG  Exercise Repetitions and Resistance Comments  SOC X10 for pelvic mobility and finding neutral position   AB bracing X10  Advised to use during transitional movements               Blank cell = exercise not performed today    Patient Education:       Discussed ADL's and Postures to decrease pain triggers. Reviewed hinge bending at hips and squatting  while keeping back in neutral position. Advised Pt on  getting a reacher to help her at this time retrieve things from the floor. She is currently squatting down and this flairs her back-up.                         Modalities  Date:  Combo: Lumbar, 1.5 w/cm2 x 10 mins LT  side with Pt RT sidelying, 10 mins, Pain HMP and IFC at 80-150 Hz on 40% scan x 15 minutes to patient's left low back.                                                                                      ASSESSMENT:  CLINICAL IMPRESSION: The patient  presents to OPPT with c/o left-sided low back pain that has been ongoing since January of this year. She was 7/10 today LT side LB. Rx focused on postures and movement patterns to decease her daily pain triggers with ADL's as well as SOC to find neutral position and AB bracing to protect LB during transitional movements.     OBJECTIVE IMPAIRMENTS decreased activity tolerance, increased muscle spasms, and pain.   ACTIVITY LIMITATIONS carrying, lifting, bending, sitting, and standing  PARTICIPATION LIMITATIONS: meal prep, cleaning, laundry, and yard work  PERSONAL FACTORS 1-2 comorbidities: Previous lumbar surgeries  are also affecting patient's functional outcome.   REHAB POTENTIAL: Good  CLINICAL DECISION MAKING: Stable/uncomplicated  EVALUATION COMPLEXITY: Low     LONG TERM GOALS: Target date: 08/09/2022  Ind with HEP. Baseline:  Goal status:  INITIAL 2.  Perform ADL's with pain not > 3-4/10. Baseline:  Goal status: INITIAL  3.  Eliminate LE symptoms. Baseline:  Goal status: INITIAL   PLAN: PT FREQUENCY: 2x/week  PT DURATION: 6 weeks  PLANNED INTERVENTIONS: Therapeutic exercises, Therapeutic activity, Patient/Family education, Cognitive remediation, Electrical stimulation, Moist heat, Ultrasound, and Manual therapy.  PLAN FOR NEXT SESSION: STW/M and modalites as needed.  Core  exercise progression.   Kerston Landeck,CHRIS, PTA 06/28/2022, 9:21 AM

## 2022-06-29 ENCOUNTER — Encounter: Payer: Medicare Other | Admitting: Physical Therapy

## 2022-06-29 DIAGNOSIS — H40033 Anatomical narrow angle, bilateral: Secondary | ICD-10-CM | POA: Diagnosis not present

## 2022-06-29 DIAGNOSIS — E119 Type 2 diabetes mellitus without complications: Secondary | ICD-10-CM | POA: Diagnosis not present

## 2022-07-01 ENCOUNTER — Ambulatory Visit: Payer: Medicare Other | Admitting: Physical Therapy

## 2022-07-01 ENCOUNTER — Encounter: Payer: Self-pay | Admitting: Physical Therapy

## 2022-07-01 DIAGNOSIS — M5459 Other low back pain: Secondary | ICD-10-CM | POA: Diagnosis not present

## 2022-07-01 NOTE — Therapy (Signed)
OUTPATIENT PHYSICAL THERAPY THORACOLUMBAR TREATMENT   Patient Name: Morgan [name] MRN: [id number] DOB:[birth year], 56 y.o., female Today's Date: 07/01/2022   PT End of Session - 07/01/22 0928     Visit Number 3    Number of Visits 12    Date for PT Re-Evaluation 08/03/22    Authorization Type FOTO AT LEAST EVERY 5TH VISIT.  PROGRESS NOTE AT 10TH VISIT.  KX MODIFIER AFTER 15 VISITS.    PT Start Time 0900    PT Stop Time 0946    PT Time Calculation (min) 46 min    Behavior During Therapy [facility] for tasks assessed/performed             Past Medical History:  Diagnosis Date   Allergic rhinitis    Anemia     after gastric bypass in 2018   Anxiety    Barrett's esophagus    Bipolar affective disorder ([place])    CAP (community acquired pneumonia) 07/20/2018   Chronic respiratory failure (HCC)    Constipation, chronic    Degenerative joint disease of spine    Depression    Diabetes mellitus without complication ([place])    type 2    DM (diabetes mellitus) ([place])    "hypoglycemic" per pt - no longer takes DM meds due to weight loss from gastric bypass in 2018   Dry eye    Family history of adverse reaction to anesthesia    nausea and vomiting   Gastroparesis    GERD (gastroesophageal reflux disease)    H/O shoulder replacement 08/11/2020   left shoulder   History of bariatric surgery 03/2017   History of hiatal hernia    HLD (hyperlipidemia) 03/12/2013   Hyperlipidemia    Hypertension    No HTN meds since weight loss from Gastric Bypass in 2018   Intractable chronic migraine without aura 06/04/2015   Migraine headache    Morbid obesity (HCC)    OSA (obstructive sleep apnea)    No cpap since gastric surgery   Osteoarthritis    bilateral knee   SBO (small bowel obstruction) ([place]) 03/19/2019   Sleep apnea    Unspecified hypothyroidism    Vitamin B 12 deficiency    Vitamin D deficiency    Past Surgical History:  Procedure Laterality Date   ANKLE ARTHROSCOPY WITH  RECONSTRUCTION Right 09/24/2019   Procedure: ANKLE ARTHROSCOPY DEBRIDEMENT TREATMENT OF OSTEOCHONDRAL LESION TALUS. PRONEAL TENDON DEBRIDEMENT;  Surgeon: Erle Crocker, MD;  Location: [facility];  Service: Orthopedics;  Laterality: Right;  SURGERY REQUEST TIME 2 HOURS   APPENDECTOMY  1978   CHOLECYSTECTOMY  2005   COLONOSCOPY     disc repair  05/17/2019   with rods, lumbar spine   GASTRIC ROUX-EN-Y N/A 03/21/2017   Procedure: LAPAROSCOPIC ROUX-EN-Y GASTRIC, UPPER ENDO;  Surgeon: Greer Pickerel, MD;  [facility];  Service: General;  Laterality: N/A;   GASTROSTOMY N/A 06/19/2018   Procedure: LAPRASCOPIC INSERTION OF GASTROSTOMY TUBE;  Surgeon: Greer Pickerel, MD;  [facility];  Service: General;  Laterality: N/A;   GASTROSTOMY TUBE PLACEMENT Left    06/2018   HERNIA REPAIR     HIATAL HERNIA REPAIR N/A 06/19/2018   Procedure: LAPAROSCOPIC REPAIR OF HIATAL HERNIA;  Surgeon: Greer Pickerel, MD;  Location: [facility];  Service: General;  Laterality: N/A;   LAPAROSCOPIC LYSIS OF ADHESIONS  03/19/2019   Dr. Romana Juniper   LAPAROSCOPY N/A 06/19/2018   Procedure: LAPAROSCOPY DIAGNOSTIC;  Surgeon: Greer Pickerel, MD;  [facility];  Service: General;  Laterality: N/A;   LAPAROSCOPY N/A 03/19/2019   Procedure: LAPAROSCOPY DIAGNOSTIC lysis of adhesions;  Surgeon: Clovis Riley, MD;  [facility];  Service: General;  Laterality: N/A;   LUMBAR LAMINECTOMY/DECOMPRESSION MICRODISCECTOMY Right 10/11/2018   Procedure: LAMINECTOMY AND FORAMINOTOMY RIGHT LUMBAR FOUR- LUMBAR FIVE;  Surgeon: Consuella Lose, MD;  Location: [facility];  Service: Neurosurgery;  Laterality: Right;   MASS EXCISION Right 09/23/2021   Procedure: EXCISION MASS VOLAR ASPECT RIGHT HAND;  Surgeon: Leanora Cover, MD;  Location: [facility];  Service: Orthopedics;  Laterality: Right;  45 MIN   SHOULDER ARTHROSCOPY  7/12   left-dsc   TONSILLECTOMY  at age 37   TOTAL SHOULDER ARTHROPLASTY Left 08/11/2020    Procedure: TOTAL SHOULDER ARTHROPLASTY;  Surgeon: Marchia Bond, MD;  Location: [facility];  Service: Orthopedics;  Laterality: Left;   TRIGGER FINGER RELEASE  12/20/2012   Procedure: RELEASE TRIGGER FINGER/A-1 PULLEY;  Surgeon: Tennis Must, MD;  Location: [facility];  Service: Orthopedics;  Laterality: Left;  LEFT TRIGGER THUMB RELEASE   TRIGGER FINGER RELEASE Right 09/23/2021   Procedure: RELEASE A-1 PULLEY RIGHT INDEX FINGER;  Surgeon: Leanora Cover, MD;  Location: [facility];  Service: Orthopedics;  Laterality: Right;   Patient Active Problem List   Diagnosis Date Noted   Osteoarthritis of left shoulder 08/11/2020   S/P shoulder replacement, left 08/11/2020   AMS (altered mental status) 05/19/2019   Thrombocytopenia ([place]) 05/19/2019   SBO (small bowel obstruction) s/p lap LOA 03/19/2019 03/19/2019   Reactive hypoglycemia 02/12/2019   Chronic migraine without aura, with intractable migraine, so stated, with status migrainosus 11/20/2018   Lumbar radiculopathy 10/11/2018   Atelectasis    Hypoglycemia    Hypotension    Bradycardia    Epigastric pain 06/19/2018   History of adenomatous polyp of colon 10/23/2017   Hyperlipidemia associated with type 2 diabetes mellitus ([place]) 07/06/2017   GERD (gastroesophageal reflux disease) 03/21/2017   History of Roux-en-Y gastric bypass 2018 03/21/2017   Morbid obesity with BMI of 40.0-44.9, adult ([place]) 06/06/2016   Intractable chronic migraine without aura 06/04/2015   Abnormal uterine bleeding 05/13/2015   Pancreatitis 02/27/2014   Fatty liver disease, nonalcoholic 69/67/8938   Bipolar disorder ([place]) 09/27/5101   Metabolic syndrome 58/52/7782   Vitamin D deficiency    DM (diabetes mellitus) ([place]) 03/12/2013   Surgery, elective 06/12/2011   Hypothyroidism    Constipation, chronic    Vitamin B 12 deficiency    ALLERGIC RHINITIS 12/31/2010   BARRETTS ESOPHAGUS 12/31/2010   Gastroparesis 12/31/2010    Bilateral chronic knee pain 12/31/2010   Obstructive sleep apnea 09/27/2010   Migraine 09/27/2010   RESPIRATORY FAILURE, CHRONIC 09/27/2010   Cyst of ovary 10/19/2009    REFERRING PROVIDER: Consuella Lose MD  REFERRING DIAG: Lumbar radiculopathy  Rationale for Evaluation and Treatment Rehabilitation  THERAPY DIAG:  Other low back pain  ONSET DATE: January 2023.  SUBJECTIVE:  SUBJECTIVE STATEMENT:        LB not doing well 8.5/10. Was walking in pool and felt sharp pain in back.  PERTINENT HISTORY:  2 prior lumbar surgeries, Bi-polar, DJD, DM, HTN, right ankle surgery, hernia repair, left TSA.  PAIN:  Are you having pain? Yes: NPRS scale: 8.5/10 Pain location: Left LB and into LE's. Pain description: Ache, sore, throbbing, numb, shooting.   PRECAUTIONS: None   OBJECTIVE:      TODAY'S TREATMENT :                                           EXERCISE LOG  Exercise Repetitions and Resistance Comments  Nustep level 3 10 minutes.   Bilateral SKTC 1 minute each   DKTC 1 minute   Hip bridges  1 minute                                  Modalities  Date: 07/01/22. STW/M x 5 minutes to patient's right low back musculature to reduce pain and tone. HMP and IFC at 80-150 Hz on 40% scan x 20 minutes to patient's left low back.                                                                                      ASSESSMENT:  CLINICAL IMPRESSION: Patient did well with there ex today.  LBP high today in part due to pool walking.    LONG TERM GOALS: Target date: 08/12/2022  Ind with HEP. Baseline:  Goal status:  INITIAL 2.  Perform ADL's with pain not > 3-4/10. Baseline:  Goal status: INITIAL  3.  Eliminate LE symptoms. Baseline:  Goal status: INITIAL   PLAN: PT FREQUENCY:  2x/week  PT DURATION: 6 weeks  PLANNED INTERVENTIONS: Therapeutic exercises, Therapeutic activity, Patient/Family education, Cognitive remediation, Electrical stimulation, Moist heat, Ultrasound, and Manual therapy.  PLAN FOR NEXT SESSION: STW/M and modalites as needed.  Core exercise progression.   Anjali Manzella, [place], PT 07/01/2022, 10:09 AM

## 2022-07-05 ENCOUNTER — Encounter: Payer: Self-pay | Admitting: *Deleted

## 2022-07-05 ENCOUNTER — Ambulatory Visit: Payer: Medicare Other | Admitting: *Deleted

## 2022-07-05 DIAGNOSIS — M5459 Other low back pain: Secondary | ICD-10-CM | POA: Diagnosis not present

## 2022-07-05 NOTE — Therapy (Signed)
OUTPATIENT PHYSICAL THERAPY THORACOLUMBAR TREATMENT   Patient Name: Morgan [name] MRN: [id number] DOB:[birth year], 56 y.o., female Today's Date: 07/05/2022   PT End of Session - 07/05/22 0808     Visit Number 4    Number of Visits 12    Date for PT Re-Evaluation 08/03/22    Authorization Type FOTO AT LEAST EVERY 5TH VISIT.  PROGRESS NOTE AT 10TH VISIT.  KX MODIFIER AFTER 15 VISITS.    PT Start Time 0815             Past Medical History:  Diagnosis Date   Allergic rhinitis    Anemia     after gastric bypass in 2018   Anxiety    Barrett's esophagus    Bipolar affective disorder ([place])    CAP (community acquired pneumonia) 07/20/2018   Chronic respiratory failure (HCC)    Constipation, chronic    Degenerative joint disease of spine    Depression    Diabetes mellitus without complication ([place])    type 2    DM (diabetes mellitus) ([place])    "hypoglycemic" per pt - no longer takes DM meds due to weight loss from gastric bypass in 2018   Dry eye    Family history of adverse reaction to anesthesia    nausea and vomiting   Gastroparesis    GERD (gastroesophageal reflux disease)    H/O shoulder replacement 08/11/2020   left shoulder   History of bariatric surgery 03/2017   History of hiatal hernia    HLD (hyperlipidemia) 03/12/2013   Hyperlipidemia    Hypertension    No HTN meds since weight loss from Gastric Bypass in 2018   Intractable chronic migraine without aura 06/04/2015   Migraine headache    Morbid obesity (HCC)    OSA (obstructive sleep apnea)    No cpap since gastric surgery   Osteoarthritis    bilateral knee   SBO (small bowel obstruction) ([place]) 03/19/2019   Sleep apnea    Unspecified hypothyroidism    Vitamin B 12 deficiency    Vitamin D deficiency    Past Surgical History:  Procedure Laterality Date   ANKLE ARTHROSCOPY WITH RECONSTRUCTION Right 09/24/2019   Procedure: ANKLE ARTHROSCOPY DEBRIDEMENT TREATMENT OF OSTEOCHONDRAL LESION TALUS. PRONEAL TENDON  DEBRIDEMENT;  Surgeon: Erle Crocker, MD;  Location: [facility];  Service: Orthopedics;  Laterality: Right;  SURGERY REQUEST TIME 2 HOURS   APPENDECTOMY  1978   CHOLECYSTECTOMY  2005   COLONOSCOPY     disc repair  05/17/2019   with rods, lumbar spine   GASTRIC ROUX-EN-Y N/A 03/21/2017   Procedure: LAPAROSCOPIC ROUX-EN-Y GASTRIC, UPPER ENDO;  Surgeon: Greer Pickerel, MD;  Location: [facility];  Service: General;  Laterality: N/A;   GASTROSTOMY N/A 06/19/2018   Procedure: LAPRASCOPIC INSERTION OF GASTROSTOMY TUBE;  Surgeon: Greer Pickerel, MD;  Location: [facility];  Service: General;  Laterality: N/A;   GASTROSTOMY TUBE PLACEMENT Left    06/2018   HERNIA REPAIR     HIATAL HERNIA REPAIR N/A 06/19/2018   Procedure: LAPAROSCOPIC REPAIR OF HIATAL HERNIA;  Surgeon: Greer Pickerel, MD;  Location: [facility];  Service: General;  Laterality: N/A;   LAPAROSCOPIC LYSIS OF ADHESIONS  03/19/2019   Dr. Romana Juniper   LAPAROSCOPY N/A 06/19/2018   Procedure: LAPAROSCOPY DIAGNOSTIC;  Surgeon: Greer Pickerel, MD;  Location: [facility];  Service: General;  Laterality: N/A;   LAPAROSCOPY N/A 03/19/2019   Procedure: LAPAROSCOPY DIAGNOSTIC lysis of adhesions;  Surgeon: Clovis Riley, MD;  Location: [facility];  Service: General;  Laterality: N/A;   LUMBAR LAMINECTOMY/DECOMPRESSION MICRODISCECTOMY Right 10/11/2018   Procedure: LAMINECTOMY AND FORAMINOTOMY RIGHT LUMBAR FOUR- LUMBAR FIVE;  Surgeon: Consuella Lose, MD;  Location: [facility];  Service: Neurosurgery;  Laterality: Right;   MASS EXCISION Right 09/23/2021   Procedure: EXCISION MASS VOLAR ASPECT RIGHT HAND;  Surgeon: Leanora Cover, MD;  Location: [facility];  Service: Orthopedics;  Laterality: Right;  45 MIN   SHOULDER ARTHROSCOPY  7/12   left-dsc   TONSILLECTOMY  at age 49   TOTAL SHOULDER ARTHROPLASTY Left 08/11/2020   Procedure: TOTAL SHOULDER ARTHROPLASTY;  Surgeon: Marchia Bond, MD;  Location: [facility];  Service: Orthopedics;   Laterality: Left;   TRIGGER FINGER RELEASE  12/20/2012   Procedure: RELEASE TRIGGER FINGER/A-1 PULLEY;  Surgeon: Tennis Must, MD;  Location: [facility];  Service: Orthopedics;  Laterality: Left;  LEFT TRIGGER THUMB RELEASE   TRIGGER FINGER RELEASE Right 09/23/2021   Procedure: RELEASE A-1 PULLEY RIGHT INDEX FINGER;  Surgeon: Leanora Cover, MD;  Location: [facility];  Service: Orthopedics;  Laterality: Right;   Patient Active Problem List   Diagnosis Date Noted   Osteoarthritis of left shoulder 08/11/2020   S/P shoulder replacement, left 08/11/2020   AMS (altered mental status) 05/19/2019   Thrombocytopenia ([place]) 05/19/2019   SBO (small bowel obstruction) s/p lap LOA 03/19/2019 03/19/2019   Reactive hypoglycemia 02/12/2019   Chronic migraine without aura, with intractable migraine, so stated, with status migrainosus 11/20/2018   Lumbar radiculopathy 10/11/2018   Atelectasis    Hypoglycemia    Hypotension    Bradycardia    Epigastric pain 06/19/2018   History of adenomatous polyp of colon 10/23/2017   Hyperlipidemia associated with type 2 diabetes mellitus ([place]) 07/06/2017   GERD (gastroesophageal reflux disease) 03/21/2017   History of Roux-en-Y gastric bypass 2018 03/21/2017   Morbid obesity with BMI of 40.0-44.9, adult ([place]) 06/06/2016   Intractable chronic migraine without aura 06/04/2015   Abnormal uterine bleeding 05/13/2015   Pancreatitis 02/27/2014   Fatty liver disease, nonalcoholic 77/82/4235   Bipolar disorder ([place]) 36/14/4315   Metabolic syndrome 40/07/6760   Vitamin D deficiency    DM (diabetes mellitus) ([place]) 03/12/2013   Surgery, elective 06/12/2011   Hypothyroidism    Constipation, chronic    Vitamin B 12 deficiency    ALLERGIC RHINITIS 12/31/2010   BARRETTS ESOPHAGUS 12/31/2010   Gastroparesis 12/31/2010   Bilateral chronic knee pain 12/31/2010   Obstructive sleep apnea 09/27/2010   Migraine 09/27/2010   RESPIRATORY FAILURE,  CHRONIC 09/27/2010   Cyst of ovary 10/19/2009    REFERRING PROVIDER: Consuella Lose MD  REFERRING DIAG: Lumbar radiculopathy  Rationale for Evaluation and Treatment Rehabilitation  THERAPY DIAG:  Other low back pain  ONSET DATE: January 2023.  SUBJECTIVE:  SUBJECTIVE STATEMENT:        LB not doing well 9/10. Sharp pinching pain is still there. Exercises aren't helping  PERTINENT HISTORY:  2 prior lumbar surgeries, Bi-polar, DJD, DM, HTN, right ankle surgery, hernia repair, left TSA.  PAIN:  Are you having pain? Yes: NPRS scale: 9/10 Pain location: Left LB and into LE's. Pain description: Ache, sore, throbbing, numb, shooting.   PRECAUTIONS: None   OBJECTIVE:      TODAY'S TREATMENT :                                           EXERCISE LOG      07-05-22  Exercise Repetitions and Resistance Comments  Nustep level 3    Bilateral SKTC    DKTC    Hip bridges     Prone Position  Prone over 3 pillows x 10 mins Decreased pain to 8/10  Sitting and standing pelvic tiltd to find neutral position for pain decrease                               Modalities  Date: 07/05/22. [place] combo x 10 mins at 1.5 w/cm2 LT side LB paras HMP and IFC at 80-150 Hz on 40% scan x 20 minutes to patient's left low back.                                                                                      ASSESSMENT:  CLINICAL IMPRESSION: Pt arrived still with sharp pain LT side LB with LT LE radiating pain as well. She is unable to find comfort in sitting or standing. Rx focused on decreasing her pain with positions in prone over pillows and pelvic control finding neutral position. Her most comfortable position is in a reclined position. Minimal decrease in pain today. 7/10 end of session.   LONG TERM GOALS:  Target date: 08/16/2022  Ind with HEP. Baseline:  Goal status:  INITIAL 2.  Perform ADL's with pain not > 3-4/10. Baseline:  Goal status: INITIAL  3.  Eliminate LE symptoms. Baseline:  Goal status: INITIAL   PLAN: PT FREQUENCY: 2x/week  PT DURATION: 6 weeks  PLANNED INTERVENTIONS: Therapeutic exercises, Therapeutic activity, Patient/Family education, Cognitive remediation, Electrical stimulation, Moist heat, Ultrasound, and Manual therapy.  PLAN FOR NEXT SESSION: STW/M and modalites as needed.  Core exercise progression.   Bryleigh Ottaway,CHRIS, PTA 07/05/2022, 9:12 AM

## 2022-07-07 ENCOUNTER — Encounter: Payer: Self-pay | Admitting: Physical Therapy

## 2022-07-07 ENCOUNTER — Ambulatory Visit: Payer: Medicare Other | Admitting: Physical Therapy

## 2022-07-07 DIAGNOSIS — M5459 Other low back pain: Secondary | ICD-10-CM | POA: Diagnosis not present

## 2022-07-07 NOTE — Therapy (Addendum)
OUTPATIENT PHYSICAL THERAPY THORACOLUMBAR TREATMENT   Patient Name: Morgan [name] MRN: [id number] DOB:01/12/1966, 56 y.o., female Today's Date: 07/07/2022   PT End of Session - 07/07/22 0920     Visit Number 6    Number of Visits 12    Date for PT Re-Evaluation 08/03/22    Authorization Type FOTO AT LEAST EVERY 5TH VISIT.  PROGRESS NOTE AT 10TH VISIT.  KX MODIFIER AFTER 15 VISITS.    PT Start Time 0801    PT Stop Time 0858    PT Time Calculation (min) 57 min    Activity Tolerance Patient tolerated treatment well    Behavior During Therapy [facility] for tasks assessed/performed             Past Medical History:  Diagnosis Date   Allergic rhinitis    Anemia     after gastric bypass in 2018   Anxiety    Barrett's esophagus    Bipolar affective disorder ([place])    CAP (community acquired pneumonia) 07/20/2018   Chronic respiratory failure (HCC)    Constipation, chronic    Degenerative joint disease of spine    Depression    Diabetes mellitus without complication ([place])    type 2    DM (diabetes mellitus) ([place])    "hypoglycemic" per pt - no longer takes DM meds due to weight loss from gastric bypass in 2018   Dry eye    Family history of adverse reaction to anesthesia    nausea and vomiting   Gastroparesis    GERD (gastroesophageal reflux disease)    H/O shoulder replacement 08/11/2020   left shoulder   History of bariatric surgery 03/2017   History of hiatal hernia    HLD (hyperlipidemia) 03/12/2013   Hyperlipidemia    Hypertension    No HTN meds since weight loss from Gastric Bypass in 2018   Intractable chronic migraine without aura 06/04/2015   Migraine headache    Morbid obesity (HCC)    OSA (obstructive sleep apnea)    No cpap since gastric surgery   Osteoarthritis    bilateral knee   SBO (small bowel obstruction) ([place]) 03/19/2019   Sleep apnea    Unspecified hypothyroidism    Vitamin B 12 deficiency    Vitamin D deficiency    Past Surgical History:  Procedure  Laterality Date   ANKLE ARTHROSCOPY WITH RECONSTRUCTION Right 09/24/2019   Procedure: ANKLE ARTHROSCOPY DEBRIDEMENT TREATMENT OF OSTEOCHONDRAL LESION TALUS. PRONEAL TENDON DEBRIDEMENT;  Surgeon: Erle Crocker, MD;  Location: [facility];  Service: Orthopedics;  Laterality: Right;  SURGERY REQUEST TIME 2 HOURS   APPENDECTOMY  1978   CHOLECYSTECTOMY  2005   COLONOSCOPY     disc repair  05/17/2019   with rods, lumbar spine   GASTRIC ROUX-EN-Y N/A 03/21/2017   Procedure: LAPAROSCOPIC ROUX-EN-Y GASTRIC, UPPER ENDO;  Surgeon: Greer Pickerel, MD;  [facility];  Service: General;  Laterality: N/A;   GASTROSTOMY N/A 06/19/2018   Procedure: LAPRASCOPIC INSERTION OF GASTROSTOMY TUBE;  Surgeon: Greer Pickerel, MD;  [facility];  Service: General;  Laterality: N/A;   GASTROSTOMY TUBE PLACEMENT Left    06/2018   HERNIA REPAIR     HIATAL HERNIA REPAIR N/A 06/19/2018   Procedure: LAPAROSCOPIC REPAIR OF HIATAL HERNIA;  Surgeon: Greer Pickerel, MD;  [facility];  Service: General;  Laterality: N/A;   LAPAROSCOPIC LYSIS OF ADHESIONS  03/19/2019   Dr. Romana Juniper   LAPAROSCOPY N/A 06/19/2018   Procedure: LAPAROSCOPY  DIAGNOSTIC;  Surgeon: Greer Pickerel, MD;  [facility];  Service: General;  Laterality: N/A;   LAPAROSCOPY N/A 03/19/2019   Procedure: LAPAROSCOPY DIAGNOSTIC lysis of adhesions;  Surgeon: Clovis Riley, MD;  [facility];  Service: General;  Laterality: N/A;   LUMBAR LAMINECTOMY/DECOMPRESSION MICRODISCECTOMY Right 10/11/2018   Procedure: LAMINECTOMY AND FORAMINOTOMY RIGHT LUMBAR FOUR- LUMBAR FIVE;  Surgeon: Consuella Lose, MD;  Location: [facility];  Service: Neurosurgery;  Laterality: Right;   MASS EXCISION Right 09/23/2021   Procedure: EXCISION MASS VOLAR ASPECT RIGHT HAND;  Surgeon: Leanora Cover, MD;  Location: [facility];  Service: Orthopedics;  Laterality: Right;  45 MIN   SHOULDER ARTHROSCOPY  7/12   left-dsc   TONSILLECTOMY  at age 66    TOTAL SHOULDER ARTHROPLASTY Left 08/11/2020   Procedure: TOTAL SHOULDER ARTHROPLASTY;  Surgeon: Marchia Bond, MD;  Location: [facility];  Service: Orthopedics;  Laterality: Left;   TRIGGER FINGER RELEASE  12/20/2012   Procedure: RELEASE TRIGGER FINGER/A-1 PULLEY;  Surgeon: Tennis Must, MD;  Location: [facility];  Service: Orthopedics;  Laterality: Left;  LEFT TRIGGER THUMB RELEASE   TRIGGER FINGER RELEASE Right 09/23/2021   Procedure: RELEASE A-1 PULLEY RIGHT INDEX FINGER;  Surgeon: Leanora Cover, MD;  Location: [facility];  Service: Orthopedics;  Laterality: Right;   Patient Active Problem List   Diagnosis Date Noted   Osteoarthritis of left shoulder 08/11/2020   S/P shoulder replacement, left 08/11/2020   AMS (altered mental status) 05/19/2019   Thrombocytopenia ([place]) 05/19/2019   SBO (small bowel obstruction) s/p lap LOA 03/19/2019 03/19/2019   Reactive hypoglycemia 02/12/2019   Chronic migraine without aura, with intractable migraine, so stated, with status migrainosus 11/20/2018   Lumbar radiculopathy 10/11/2018   Atelectasis    Hypoglycemia    Hypotension    Bradycardia    Epigastric pain 06/19/2018   History of adenomatous polyp of colon 10/23/2017   Hyperlipidemia associated with type 2 diabetes mellitus ([place]) 07/06/2017   GERD (gastroesophageal reflux disease) 03/21/2017   History of Roux-en-Y gastric bypass 2018 03/21/2017   Morbid obesity with BMI of 40.0-44.9, adult ([place]) 06/06/2016   Intractable chronic migraine without aura 06/04/2015   Abnormal uterine bleeding 05/13/2015   Pancreatitis 02/27/2014   Fatty liver disease, nonalcoholic 71/05/2693   Bipolar disorder ([place]) 85/46/2703   Metabolic syndrome 50/08/3817   Vitamin D deficiency    DM (diabetes mellitus) ([place]) 03/12/2013   Surgery, elective 06/12/2011   Hypothyroidism    Constipation, chronic    Vitamin B 12 deficiency    ALLERGIC RHINITIS 12/31/2010   BARRETTS  ESOPHAGUS 12/31/2010   Gastroparesis 12/31/2010   Bilateral chronic knee pain 12/31/2010   Obstructive sleep apnea 09/27/2010   Migraine 09/27/2010   RESPIRATORY FAILURE, CHRONIC 09/27/2010   Cyst of ovary 10/19/2009    REFERRING PROVIDER: Consuella Lose MD  REFERRING DIAG: Lumbar radiculopathy  Rationale for Evaluation and Treatment Rehabilitation  THERAPY DIAG:  Other low back pain  ONSET DATE: January 2023.  SUBJECTIVE:  SUBJECTIVE STATEMENT:        LB not doing well 9/10. Sharp pinching pain is still there. Exercises aren't helping  PERTINENT HISTORY:  2 prior lumbar surgeries, Bi-polar, DJD, DM, HTN, right ankle surgery, hernia repair, left TSA.  PAIN:  Are you having pain? Yes: NPRS scale: 9/10 Pain location: Left LB and into LE's. Pain description: Ache, sore, throbbing, numb, shooting.   PRECAUTIONS: None   OBJECTIVE:      TODAY'S TREATMENT :                                           EXERCISE LOG      07-05-22  Exercise Repetitions and Resistance Comments  Nustep level 3 10 minutes.                        Manual:  1-1 left sustained STKC stretch and STW/M to patient's left lumbar musculature with QL release technique performed (total:  13 minutes).                          Modalities  Date: 07/07/22. HMP and Pre-mod at 80-150 Hz  x 20 minutes to patient's left low back.                                                                                      ASSESSMENT:  CLINICAL IMPRESSION: Pt arrived still with sharp pain LT side LB with LT LE radiating pain as well. Her pain remained the same post-tx.  She is going to call her MD and see about receiving an injection. LONG TERM GOALS: Target date: 08/18/2022  Ind with HEP. Baseline:  Goal status:  INITIAL 2.   Perform ADL's with pain not > 3-4/10. Baseline:  Goal status: INITIAL  3.  Eliminate LE symptoms. Baseline:  Goal status: INITIAL   PLAN: PT FREQUENCY: 2x/week  PT DURATION: 6 weeks  PLANNED INTERVENTIONS: Therapeutic exercises, Therapeutic activity, Patient/Family education, Cognitive remediation, Electrical stimulation, Moist heat, Ultrasound, and Manual therapy.  PLAN FOR NEXT SESSION: STW/M and modalites as needed.  Core exercise progression.   Waunetta Riggle, [place], PT 07/07/2022, 9:22 AM   PHYSICAL THERAPY DISCHARGE SUMMARY  Visits from Start of Care: 6.  Current functional level related to goals / functional outcomes: See above.   Remaining deficits: Continued pain.   Education / Equipment: HEP.   Patient agrees to discharge. Patient goals were not met. Patient is being discharged due to lack of progress.    Mali Willodean Leven MPT

## 2022-07-13 ENCOUNTER — Telehealth: Payer: Self-pay | Admitting: Family Medicine

## 2022-07-13 NOTE — Telephone Encounter (Signed)
LVM and mychart msg informing pt of appointment change due to provider schedule change

## 2022-07-15 DIAGNOSIS — M5416 Radiculopathy, lumbar region: Secondary | ICD-10-CM | POA: Diagnosis not present

## 2022-07-25 ENCOUNTER — Other Ambulatory Visit: Payer: Self-pay | Admitting: Family Medicine

## 2022-07-25 DIAGNOSIS — F3170 Bipolar disorder, currently in remission, most recent episode unspecified: Secondary | ICD-10-CM

## 2022-07-26 ENCOUNTER — Ambulatory Visit: Payer: Medicare Other | Admitting: Family Medicine

## 2022-07-26 ENCOUNTER — Encounter: Payer: Self-pay | Admitting: Family Medicine

## 2022-07-26 ENCOUNTER — Ambulatory Visit (INDEPENDENT_AMBULATORY_CARE_PROVIDER_SITE_OTHER): Payer: Medicare Other | Admitting: Family Medicine

## 2022-07-26 VITALS — BP 112/74 | HR 64 | Temp 97.2°F | Ht 64.0 in | Wt 174.4 lb

## 2022-07-26 DIAGNOSIS — K219 Gastro-esophageal reflux disease without esophagitis: Secondary | ICD-10-CM

## 2022-07-26 DIAGNOSIS — I152 Hypertension secondary to endocrine disorders: Secondary | ICD-10-CM | POA: Diagnosis not present

## 2022-07-26 DIAGNOSIS — Z0001 Encounter for general adult medical examination with abnormal findings: Secondary | ICD-10-CM | POA: Diagnosis not present

## 2022-07-26 DIAGNOSIS — Z9884 Bariatric surgery status: Secondary | ICD-10-CM

## 2022-07-26 DIAGNOSIS — E1159 Type 2 diabetes mellitus with other circulatory complications: Secondary | ICD-10-CM | POA: Diagnosis not present

## 2022-07-26 DIAGNOSIS — E119 Type 2 diabetes mellitus without complications: Secondary | ICD-10-CM | POA: Diagnosis not present

## 2022-07-26 DIAGNOSIS — Z79899 Other long term (current) drug therapy: Secondary | ICD-10-CM

## 2022-07-26 DIAGNOSIS — E785 Hyperlipidemia, unspecified: Secondary | ICD-10-CM

## 2022-07-26 DIAGNOSIS — E039 Hypothyroidism, unspecified: Secondary | ICD-10-CM

## 2022-07-26 DIAGNOSIS — M609 Myositis, unspecified: Secondary | ICD-10-CM | POA: Diagnosis not present

## 2022-07-26 DIAGNOSIS — N3281 Overactive bladder: Secondary | ICD-10-CM

## 2022-07-26 DIAGNOSIS — E1169 Type 2 diabetes mellitus with other specified complication: Secondary | ICD-10-CM

## 2022-07-26 DIAGNOSIS — Z5181 Encounter for therapeutic drug level monitoring: Secondary | ICD-10-CM

## 2022-07-26 DIAGNOSIS — Z0000 Encounter for general adult medical examination without abnormal findings: Secondary | ICD-10-CM

## 2022-07-26 DIAGNOSIS — F3170 Bipolar disorder, currently in remission, most recent episode unspecified: Secondary | ICD-10-CM

## 2022-07-26 LAB — BAYER DCA HB A1C WAIVED: HB A1C (BAYER DCA - WAIVED): 5.5 % (ref 4.8–5.6)

## 2022-07-26 MED ORDER — PANTOPRAZOLE SODIUM 40 MG PO TBEC: 40.0000 mg | DELAYED_RELEASE_TABLET | Freq: Every day | ORAL | 3 refills | Status: DC

## 2022-07-26 MED ORDER — FLUTICASONE PROPIONATE 50 MCG/ACT NA SUSP: NASAL | 4 refills | Status: DC

## 2022-07-26 MED ORDER — LINZESS 145 MCG PO CAPS
ORAL_CAPSULE | ORAL | 3 refills | Status: DC
Start: 2022-07-26 — End: 2023-05-29

## 2022-07-26 MED ORDER — EZETIMIBE 10 MG PO TABS
10.0000 mg | ORAL_TABLET | Freq: Every day | ORAL | 3 refills | Status: DC
Start: 2022-07-26 — End: 2023-05-29

## 2022-07-26 MED ORDER — SERTRALINE HCL 100 MG PO TABS: ORAL_TABLET | ORAL | 3 refills | Status: DC

## 2022-07-26 MED ORDER — MIRABEGRON ER 25 MG PO TB24
25.0000 mg | ORAL_TABLET | Freq: Every day | ORAL | 3 refills | Status: DC
Start: 2022-07-26 — End: 2023-05-29

## 2022-07-26 MED ORDER — ZIPRASIDONE HCL 60 MG PO CAPS: 60.0000 mg | ORAL_CAPSULE | Freq: Every day | ORAL | 3 refills | Status: DC

## 2022-07-26 NOTE — Progress Notes (Signed)
Subjective: CC: Annual physical PCP: Janora Norlander, DO Morgan [name] is a 56 y.o. female presenting to clinic today for:  1.  Type 2 diabetes associate with hypertension hyperlipidemia.  History of Roux-en-Y gastric bypass Patient notes that she continues to see her endocrinologist.  They are considering a medication to keep her blood sugars up.  She is off of all meds for sugar at this time and is really trying to maintain with diet alone.  She has had a couple of hypoglycemic episodes into the 30s and 40s and these tend to occur in the middle of the day.  She has been eating peanut butter before bedtime in efforts to keep her sugars up overnight.  No chest pain, shortness of breath, dizziness or visual disturbance reported.  She had diabetic eye exam done with Dr. Marin Comment in [place].  Next office visit with her endocrinologist will be 08/11/2022.  2.  Bipolar disorder Patient is compliant with all medications.  No reports of heart palpitations.  Weight has been stable.  3.  GERD Patient reports control of symptoms with current meds.  She very carefully avoids any medications that will aggravate her stomach.  She notes that her constipation is well controlled with Linzess.  Unfortunately she has a bulging disc that is being treated by Dr. Kathyrn Sheriff.  It continues to hurt.  She had an injection performed on the fourth of the month but that really has not helped yet.  She does not want to be on any pain medications as she has had adverse side effects to codeine containing products in the past.  She will see him again on the 24th for checkup  4.  Migraine headaches Patient's medications are managed by neurology and they are requesting some lab updates including Depakote level   ROS: Per HPI  Allergies  Allergen Reactions   Ketoconazole Hives and Swelling    SWELLING REACTION UNSPECIFIED    Pravachol [Pravastatin Sodium] Shortness Of Breath, Swelling and Anaphylaxis    Throat  swelling   Statins Other (See Comments)    Leg cramps   Tape Dermatitis and Other (See Comments)    Steri-Strips   Codeine Hypertension    increased BP   Past Medical History:  Diagnosis Date   Allergic rhinitis    Anemia     after gastric bypass in 2018   Anxiety    Barrett's esophagus    Bipolar affective disorder ([place])    CAP (community acquired pneumonia) 07/20/2018   Chronic respiratory failure (HCC)    Constipation, chronic    Degenerative joint disease of spine    Depression    Diabetes mellitus without complication ([place])    type 2    DM (diabetes mellitus) ([place])    "hypoglycemic" per pt - no longer takes DM meds due to weight loss from gastric bypass in 2018   Dry eye    Family history of adverse reaction to anesthesia    nausea and vomiting   Gastroparesis    GERD (gastroesophageal reflux disease)    H/O shoulder replacement 08/11/2020   left shoulder   History of bariatric surgery 03/2017   History of hiatal hernia    HLD (hyperlipidemia) 03/12/2013   Hyperlipidemia    Hypertension    No HTN meds since weight loss from Gastric Bypass in 2018   Intractable chronic migraine without aura 06/04/2015   Migraine headache    Morbid obesity (HCC)    OSA (obstructive sleep apnea)  No cpap since gastric surgery   Osteoarthritis    bilateral knee   SBO (small bowel obstruction) ([place]) 03/19/2019   Sleep apnea    Unspecified hypothyroidism    Vitamin B 12 deficiency    Vitamin D deficiency     Current Outpatient Medications:    Calcium-Vitamin D-Vitamin K 650-12.5-40 MG-MCG-MCG CHEW, Chew 1 tablet by mouth 2 (two) times daily., Disp: , Rfl:    dicyclomine (BENTYL) 20 MG tablet, TAKE 1 TABLET EVERY 8 HOURS AS NEEDED FOR ABDOMINAL CRAMPING, Disp: 30 tablet, Rfl: 0   divalproex (DEPAKOTE) 500 MG DR tablet, Take 500 mg by mouth in the morning and 1000 mg at bedtime, Disp: 270 tablet, Rfl: 3   ezetimibe (ZETIA) 10 MG tablet, TAKE ONE TABLET ONCE DAILY, Disp: 90 tablet, Rfl:  1   fluticasone (FLONASE) 50 MCG/ACT nasal spray, USE 2 SPRAYS IN EACH NOSTRIL TWICE DAILY AS NEEDED FOR ALLERGY, Disp: 16 g, Rfl: 4   gabapentin (NEURONTIN) 300 MG capsule, Takes 2 capsules in morning, one cap at lunch, 2 caps at bedtime, Disp: 450 capsule, Rfl: 3   Galcanezumab-gnlm (EMGALITY) 120 MG/ML SOAJ, Inject 120 mg into the skin every 30 (thirty) days., Disp: 3 mL, Rfl: 3   Glucagon, rDNA, (GLUCAGON EMERGENCY IJ), Inject 1 Syringe as directed daily as needed (emergency low blood sugar). , Disp: , Rfl:    glucose blood (ONETOUCH ULTRA) test strip, Check BS four times a day Dx E11.9, Disp: 400 strip, Rfl: 3   Insulin Syringe-Needle U-100 (INSULIN SYRINGE 1CC/31GX5/16") 31G X 5/16" 1 ML MISC, Use 3 times daily with octreotide, Disp: , Rfl:    levothyroxine (SYNTHROID) 112 MCG tablet, TAKE ONE TABLET ONCE DAILY BEFORE BREAKFAST, Disp: 90 tablet, Rfl: 2   LINZESS 145 MCG CAPS capsule, TAKE (1) CAPSULE DAILY BEFORE BREAKFAST., Disp: 90 capsule, Rfl: 0   loratadine (CLARITIN) 10 MG tablet, Take 10 mg by mouth daily as needed for allergies. , Disp: , Rfl:    Multiple Vitamins-Minerals (BARIATRIC MULTIVITAMINS/IRON PO), Take 1 tablet by mouth daily., Disp: , Rfl:    MYRBETRIQ 25 MG TB24 tablet, TAKE ONE TABLET BY MOUTH EVERY DAY, Disp: 90 tablet, Rfl: 3   nystatin powder, APPLY TO AFFECTED AREA OF GROIN RASH TWICE A DAY FOR 7 TO 10 DAYS PER FLARE, Disp: 30 g, Rfl: 1   pantoprazole (PROTONIX) 40 MG tablet, TAKE 1 TABLET DAILY, Disp: 90 tablet, Rfl: 0   polyethylene glycol (MIRALAX / GLYCOLAX) packet, Take 17 g by mouth at bedtime., Disp: , Rfl:    Probiotic Product (PROBIOTIC DAILY PO), Take 1 capsule by mouth daily., Disp: , Rfl:    rizatriptan (MAXALT-MLT) 10 MG disintegrating tablet, Take 1 tablet (10 mg total) by mouth as needed (take 1 tablet as needed for migraine. may repeat in 2 hours if needed)., Disp: 10 tablet, Rfl: 2   sertraline (ZOLOFT) 100 MG tablet, TAKE 2 TABLETS DAILY AS DIRECTED,  Disp: 180 tablet, Rfl: 0   Simethicone (GAS-X PO), Take by mouth., Disp: , Rfl:    tiZANidine (ZANAFLEX) 2 MG tablet, TAKE 1 TABLET EVERY 8 HOURS AS NEEDED, Disp: 30 tablet, Rfl: 3   ziprasidone (GEODON) 60 MG capsule, TAKE 1 CAPSULE AT BEDTIME, Disp: 90 capsule, Rfl: 1 Social History   Socioeconomic History   Marital status: Single    Spouse name: Not on file   Number of children: 0   Years of education: HS   Highest education level: 12th grade  Occupational History  Occupation: disabled    Fish farm manager: UNEMPLOYED  Tobacco Use   Smoking status: Never   Smokeless tobacco: Never  Vaping Use   Vaping Use: Never used  Substance and Sexual Activity   Alcohol use: No   Drug use: No   Sexual activity: Never    Birth control/protection: Post-menopausal    Comment: LMP 10/2017  Other Topics Concern   Not on file  Social History Narrative   Patient is right handed.   Patient drinks 2 glasses of caffeine daily.   Lives at home. Her niece is living with her right now.   Social Determinants of Health   Financial Resource Strain: Low Risk  (03/14/2022)   Overall Financial Resource Strain (CARDIA)    Difficulty of Paying Living Expenses: Not very hard  Food Insecurity: No Food Insecurity (03/14/2022)   Hunger Vital Sign    Worried About Running Out of Food in the Last Year: Never true    Ran Out of Food in the Last Year: Never true  Transportation Needs: No Transportation Needs (03/14/2022)   PRAPARE - Hydrologist (Medical): No    Lack of Transportation (Non-Medical): No  Physical Activity: Insufficiently Active (03/14/2022)   Exercise Vital Sign    Days of Exercise per Week: 3 days    Minutes of Exercise per Session: 30 min  Stress: No Stress Concern Present (03/14/2022)   [place]    Feeling of Stress : Only a little  Social Connections: Moderately Integrated (03/14/2022)   Social Connection  and Isolation Panel [NHANES]    Frequency of Communication with Friends and Family: More than three times a week    Frequency of Social Gatherings with Friends and Family: More than three times a week    Attends Religious Services: More than 4 times per year    Active Member of [employer] or Organizations: Yes    Attends Archivist Meetings: More than 4 times per year    Marital Status: Never married  Intimate Partner Violence: Not At Risk (03/14/2022)   Humiliation, Afraid, Rape, and Kick questionnaire    Fear of Current or Ex-Partner: No    Emotionally Abused: No    Physically Abused: No    Sexually Abused: No   Family History  Problem Relation Age of Onset   Asthma Father    Allergies Father    Heart disease Father        enlarged heart   Peripheral vascular disease Father    Diabetes Father    Hyperlipidemia Father    Arthritis Father    Asthma Sister    Cancer Sister        colon at 34 yr old.   Colon cancer Sister    Allergies Mother    Stroke Mother 81       with hemi paralysis   Diabetes Mother    Hyperlipidemia Mother    Hypertension Mother    GI problems Mother    Arthritis Mother    Allergies Brother    Early death Brother 28       congenital abormality   Allergies Sister    Diabetes Sister    Asthma Sister    Colon polyps Sister    Hyperlipidemia Sister    GI problems Sister        gastroporesis    Liver disease Sister        fatty liver   Stroke  Sister        intercrandial bleed   Diabetes Brother    Hypertension Brother    Hyperlipidemia Brother    Heart disease Paternal Aunt    Migraines Neg Hx     Objective: Office vital signs reviewed. BP 112/74   Pulse 64   Temp (!) 97.2 F (36.2 C)   Ht '5\' 4"'  (1.626 m)   Wt 174 lb 6.4 oz (79.1 kg)   LMP 10/17/2017 (Approximate)   SpO2 98%   BMI 29.94 kg/m   Physical Examination:  General: Awake, alert, nontoxic female, No acute distress HEENT: Sclera white.  No exophthalmos.  No conjunctival  pallor.  TMs intact bilaterally.  Normal light reflex.  Nares without significant drainage.  Oropharynx without masses.  No sublingual lesions either Cardio: regular rate and rhythm, S1S2 heard, no murmurs appreciated Pulm: clear to auscultation bilaterally, no wheezes, rhonchi or rales; normal work of breathing on room air GI: soft, non-tender, non-distended, bowel sounds present x4, no hepatomegaly, no splenomegaly, no masses GU: Exam deferred Extremities: warm, well perfused, No edema, cyanosis or clubbing; +2 pulses bilaterally MSK: antalgic gait and station but ambulating independently Skin: Multiple pigmented nevi.  She has several skin tags along the anterior neck.  She has a cyst formation in the middle of the upper back that is noninfected or inflamed Neuro: No focal neurologic deficits.  Patellar DTRs 2/4 bilaterally  Assessment/ Plan: 56 y.o. female   Annual physical exam  Acquired hypothyroidism - Plan: TSH, T4, free  History of Roux-en-Y gastric bypass - Plan: Ferritin, Iron  Diet-controlled diabetes mellitus ([place]) - Plan: Bayer DCA Hb A1c Waived  Hypertension associated with diabetes ([place]) - Plan: CMP14+EGFR  Hyperlipidemia associated with type 2 diabetes mellitus (HCC)  Statin-induced myositis  Bipolar affective disorder in remission ([place]) - Plan: sertraline (ZOLOFT) 100 MG tablet, ziprasidone (GEODON) 60 MG capsule, CMP14+EGFR  Medication monitoring encounter - Plan: Valproic acid level, CMP14+EGFR  High risk medication use - Plan: EKG 12-Lead, CMP14+EGFR  Overactive bladder - Plan: mirabegron ER (MYRBETRIQ) 25 MG TB24 tablet  Gastroesophageal reflux disease without esophagitis - Plan: pantoprazole (PROTONIX) 40 MG tablet  ROI for eye exam completed today.  Sugar shows excellent control of diabetes.  This is diet controlled.  Blood pressure also diet-controlled.  Has statin intolerance and only treated with Zetia for cholesterol.  Depakote level ordered and we  will CC this to Dr. Greer Pickerel  EKG also ordered today given use of atypical antipsychotic.  Check renal and liver enzymes  Bladder and GERD are stable with current meds.  These have been reordered as they will expire soon  Orders Placed This Encounter  Procedures   TSH   Bayer DCA Hb A1c Waived   T4, free   No orders of the defined types were placed in this encounter.    Janora Norlander, DO [place] [phone]

## 2022-07-26 NOTE — Patient Instructions (Signed)
Preventive Care 40-55 Years Old, Female Preventive care refers to lifestyle choices and visits with your health care provider that can promote health and wellness. Preventive care visits are also called wellness exams. What can I expect for my preventive care visit? Counseling Your health care provider may ask you questions about your: Medical history, including: Past medical problems. Family medical history. Pregnancy history. Current health, including: Menstrual cycle. Method of birth control. Emotional well-being. Home life and relationship well-being. Sexual activity and sexual health. Lifestyle, including: Alcohol, nicotine or tobacco, and drug use. Access to firearms. Diet, exercise, and sleep habits. Work and work environment. Sunscreen use. Safety issues such as seatbelt and bike helmet use. Physical exam Your health care provider will check your: Height and weight. These may be used to calculate your BMI (body mass index). BMI is a measurement that tells if you are at a healthy weight. Waist circumference. This measures the distance around your waistline. This measurement also tells if you are at a healthy weight and may help predict your risk of certain diseases, such as type 2 diabetes and high blood pressure. Heart rate and blood pressure. Body temperature. Skin for abnormal spots. What immunizations do I need?  Vaccines are usually given at various ages, according to a schedule. Your health care provider will recommend vaccines for you based on your age, medical history, and lifestyle or other factors, such as travel or where you work. What tests do I need? Screening Your health care provider may recommend screening tests for certain conditions. This may include: Lipid and cholesterol levels. Diabetes screening. This is done by checking your blood sugar (glucose) after you have not eaten for a while (fasting). Pelvic exam and Pap test. Hepatitis B test. Hepatitis C  test. HIV (human immunodeficiency virus) test. STI (sexually transmitted infection) testing, if you are at risk. Lung cancer screening. Colorectal cancer screening. Mammogram. Talk with your health care provider about when you should start having regular mammograms. This may depend on whether you have a family history of breast cancer. BRCA-related cancer screening. This may be done if you have a family history of breast, ovarian, tubal, or peritoneal cancers. Bone density scan. This is done to screen for osteoporosis. Talk with your health care provider about your test results, treatment options, and if necessary, the need for more tests. Follow these instructions at home: Eating and drinking  Eat a diet that includes fresh fruits and vegetables, whole grains, lean protein, and low-fat dairy products. Take vitamin and mineral supplements as recommended by your health care provider. Do not drink alcohol if: Your health care provider tells you not to drink. You are pregnant, may be pregnant, or are planning to become pregnant. If you drink alcohol: Limit how much you have to 0-1 drink a day. Know how much alcohol is in your drink. In the [place] one drink equals one 12 oz bottle of beer (355 mL), one 5 oz glass of wine (148 mL), or one 1 oz glass of hard liquor (44 mL). Lifestyle Brush your teeth every morning and night with fluoride toothpaste. Floss one time each day. Exercise for at least 30 minutes 5 or more days each week. Do not use any products that contain nicotine or tobacco. These products include cigarettes, chewing tobacco, and vaping devices, such as e-cigarettes. If you need help quitting, ask your health care provider. Do not use drugs. If you are sexually active, practice safe sex. Use a condom or other form of protection to   prevent STIs. If you do not wish to become pregnant, use a form of birth control. If you plan to become pregnant, see your health care provider for a  prepregnancy visit. Take aspirin only as told by your health care provider. Make sure that you understand how much to take and what form to take. Work with your health care provider to find out whether it is safe and beneficial for you to take aspirin daily. Find healthy ways to manage stress, such as: Meditation, yoga, or listening to music. Journaling. Talking to a trusted person. Spending time with friends and family. Minimize exposure to UV radiation to reduce your risk of skin cancer. Safety Always wear your seat belt while driving or riding in a vehicle. Do not drive: If you have been drinking alcohol. Do not ride with someone who has been drinking. When you are tired or distracted. While texting. If you have been using any mind-altering substances or drugs. Wear a helmet and other protective equipment during sports activities. If you have firearms in your house, make sure you follow all gun safety procedures. Seek help if you have been physically or sexually abused. What's next? Visit your health care provider once a year for an annual wellness visit. Ask your health care provider how often you should have your eyes and teeth checked. Stay up to date on all vaccines. This information is not intended to replace advice given to you by your health care provider. Make sure you discuss any questions you have with your health care provider. Document Revised: 05/26/2021 Document Reviewed: 05/26/2021 Elsevier Patient Education  [place].

## 2022-07-27 LAB — CMP14+EGFR
ALT: 25 IU/L (ref 0–32)
AST: 21 IU/L (ref 0–40)
Albumin/Globulin Ratio: 2 (ref 1.2–2.2)
Albumin: 3.8 g/dL (ref 3.8–4.9)
Alkaline Phosphatase: 61 IU/L (ref 44–121)
BUN/Creatinine Ratio: 40 — ABNORMAL HIGH (ref 9–23)
BUN: 24 mg/dL (ref 6–24)
Bilirubin Total: <0.2 mg/dL (ref 0.0–1.2)
CO2: 25 mmol/L (ref 20–29)
Calcium: 8.8 mg/dL (ref 8.7–10.2)
Chloride: 99 mmol/L (ref 96–106)
Creatinine, Ser: 0.6 mg/dL (ref 0.57–1.00)
Globulin, Total: 1.9 g/dL (ref 1.5–4.5)
Glucose: 85 mg/dL (ref 70–99)
Potassium: 4.5 mmol/L (ref 3.5–5.2)
Sodium: 139 mmol/L (ref 134–144)
Total Protein: 5.7 g/dL — ABNORMAL LOW (ref 6.0–8.5)
eGFR: 107 mL/min/{1.73_m2} (ref >59)

## 2022-07-27 LAB — IRON: Iron: 27 ug/dL (ref 27–159)

## 2022-07-27 LAB — FERRITIN: Ferritin: 14 ng/mL — ABNORMAL LOW (ref 15–150)

## 2022-07-27 LAB — T4, FREE: Free T4: 0.9 ng/dL (ref 0.82–1.77)

## 2022-07-27 LAB — TSH: TSH: 2.13 u[IU]/mL (ref 0.450–4.500)

## 2022-07-27 LAB — VALPROIC ACID LEVEL: Valproic Acid Lvl: 42 ug/mL — ABNORMAL LOW (ref 50–100)

## 2022-08-04 DIAGNOSIS — M5416 Radiculopathy, lumbar region: Secondary | ICD-10-CM | POA: Diagnosis not present

## 2022-08-04 DIAGNOSIS — M5136 Other intervertebral disc degeneration, lumbar region: Secondary | ICD-10-CM | POA: Diagnosis not present

## 2022-08-08 ENCOUNTER — Other Ambulatory Visit: Payer: Self-pay | Admitting: Neurosurgery

## 2022-08-11 DIAGNOSIS — E161 Other hypoglycemia: Secondary | ICD-10-CM | POA: Diagnosis not present

## 2022-08-11 DIAGNOSIS — Z9884 Bariatric surgery status: Secondary | ICD-10-CM | POA: Diagnosis not present

## 2022-08-12 ENCOUNTER — Other Ambulatory Visit: Payer: Self-pay | Admitting: Family Medicine

## 2022-08-23 ENCOUNTER — Other Ambulatory Visit: Payer: Self-pay | Admitting: Neurosurgery

## 2022-08-30 ENCOUNTER — Other Ambulatory Visit: Payer: Self-pay | Admitting: Neurosurgery

## 2022-08-30 NOTE — Pre-Procedure Instructions (Signed)
Surgical Instructions    Your procedure is scheduled on Friday, September 29th.  Report to [facility] Main Entrance "A" at 05:30 A.M., then check in with the Admitting office.  Call this number if you have problems the morning of surgery:  [phone]   If you have any questions prior to your surgery date call [phone]: Open Monday-Friday 8am-4pm    Remember:  Do not eat or drink after midnight the night before your surgery     Take these medicines the morning of surgery with A SIP OF WATER  divalproex (DEPAKOTE) ezetimibe (ZETIA)  gabapentin (NEURONTIN)  levothyroxine (SYNTHROID)  mirabegron ER (MYRBETRIQ)  pantoprazole (PROTONIX)  sertraline (ZOLOFT)   If needed: carboxymethylcellulose (REFRESH PLUS) dicyclomine (BENTYL)  fluticasone (FLONASE)  Glucagon, rDNA, (GLUCAGON EMERGENCY IJ) loratadine (CLARITIN)  rizatriptan (MAXALT-MLT) 10  tiZANidine (ZANAFLEX)  As of today, STOP taking any Aspirin (unless otherwise instructed by your surgeon) Aleve, Naproxen, Ibuprofen, Motrin, Advil, Goody's, BC's, all herbal medications, fish oil, and all vitamins.    HOW TO MANAGE YOUR DIABETES BEFORE AND AFTER SURGERY  Why is it important to control my blood sugar before and after surgery? Improving blood sugar levels before and after surgery helps healing and can limit problems. A way of improving blood sugar control is eating a healthy diet by:  Eating less sugar and carbohydrates  Increasing activity/exercise  Talking with your doctor about reaching your blood sugar goals High blood sugars (greater than 180 mg/dL) can raise your risk of infections and slow your recovery, so you will need to focus on controlling your diabetes during the weeks before surgery. Make sure that the doctor who takes care of your diabetes knows about your planned surgery including the date and location.  How do I manage my blood sugar before surgery? Check your blood sugar at least 4 times a day,  starting 2 days before surgery, to make sure that the level is not too high or low.  Check your blood sugar the morning of your surgery when you wake up and every 2 hours until you get to the Short Stay unit.  If your blood sugar is less than 70 mg/dL, you will need to treat for low blood sugar: Do not take insulin. Treat a low blood sugar (less than 70 mg/dL) with  cup of clear juice (cranberry or apple), 4 glucose tablets, OR glucose gel. Recheck blood sugar in 15 minutes after treatment (to make sure it is greater than 70 mg/dL). If your blood sugar is not greater than 70 mg/dL on recheck, call [phone] for further instructions. Report your blood sugar to the short stay nurse when you get to Short Stay.  If you are admitted to the hospital after surgery: Your blood sugar will be checked by the staff and you will probably be given insulin after surgery (instead of oral diabetes medicines) to make sure you have good blood sugar levels. The goal for blood sugar control after surgery is 80-180 mg/dL.                     Do NOT Smoke (Tobacco/Vaping) for 24 hours prior to your procedure.  If you use a CPAP at night, you may bring your mask/headgear for your overnight stay.   Contacts, glasses, piercing's, hearing aid's, dentures or partials may not be worn into surgery, please bring cases for these belongings.    For patients admitted to the hospital, discharge time will be determined by your treatment team.  Patients discharged the day of surgery will not be allowed to drive home, and someone needs to stay with them for 24 hours.  SURGICAL WAITING ROOM VISITATION Patients having surgery or a procedure may have no more than 2 support people in the waiting area - these visitors may rotate.   Children under the age of 64 must have an adult with them who is not the patient. If the patient needs to stay at the hospital during part of their recovery, the visitor guidelines for inpatient  rooms apply. Pre-op nurse will coordinate an appropriate time for 1 support person to accompany patient in pre-op.  This support person may not rotate.   Please refer to the [facility] website for the visitor guidelines for Inpatients (after your surgery is over and you are in a regular room).    Special instructions:   [facility]- Preparing For Surgery  Before surgery, you can play an important role. Because skin is not sterile, your skin needs to be as free of germs as possible. You can reduce the number of germs on your skin by washing with CHG (chlorahexidine gluconate) Soap before surgery.  CHG is an antiseptic cleaner which kills germs and bonds with the skin to continue killing germs even after washing.    Oral Hygiene is also important to reduce your risk of infection.  Remember - BRUSH YOUR TEETH THE MORNING OF SURGERY WITH YOUR REGULAR TOOTHPASTE  Please do not use if you have an allergy to CHG or antibacterial soaps. If your skin becomes reddened/irritated stop using the CHG.  Do not shave (including legs and underarms) for at least 48 hours prior to first CHG shower. It is OK to shave your face.  Please follow these instructions carefully.   Shower the NIGHT BEFORE SURGERY and the MORNING OF SURGERY  If you chose to wash your hair, wash your hair first as usual with your normal shampoo.  After you shampoo, rinse your hair and body thoroughly to remove the shampoo.  Use CHG Soap as you would any other liquid soap. You can apply CHG directly to the skin and wash gently with a scrungie or a clean washcloth.   Apply the CHG Soap to your body ONLY FROM THE NECK DOWN.  Do not use on open wounds or open sores. Avoid contact with your eyes, ears, mouth and genitals (private parts). Wash Face and genitals (private parts)  with your normal soap.   Wash thoroughly, paying special attention to the area where your surgery will be performed.  Thoroughly rinse your body with warm water  from the neck down.  DO NOT shower/wash with your normal soap after using and rinsing off the CHG Soap.  Pat yourself dry with a CLEAN TOWEL.  Wear CLEAN PAJAMAS to bed the night before surgery  Place CLEAN SHEETS on your bed the night before your surgery  DO NOT SLEEP WITH PETS.   Day of Surgery: Take a shower with CHG soap. Do not wear jewelry or makeup Do not wear lotions, powders, perfumes, or deodorant. Do not shave 48 hours prior to surgery.   Do not bring valuables to the hospital. [facility] is not responsible for any belongings or valuables. Do not wear nail polish, gel polish, artificial nails, or any other type of covering on natural nails (fingers and toes) If you have artificial nails or gel coating that need to be removed by a nail salon, please have this removed prior to surgery. Artificial nails  or gel coating may interfere with anesthesia's ability to adequately monitor your vital signs. Wear Clean/Comfortable clothing the morning of surgery Remember to brush your teeth WITH YOUR REGULAR TOOTHPASTE.   Please read over the following fact sheets that you were given.    If you received a COVID test during your pre-op visit  it is requested that you wear a mask when out in public, stay away from anyone that may not be feeling well and notify your surgeon if you develop symptoms. If you have been in contact with anyone that has tested positive in the last 10 days please notify you surgeon.

## 2022-08-31 ENCOUNTER — Other Ambulatory Visit: Payer: Self-pay

## 2022-08-31 ENCOUNTER — Encounter (HOSPITAL_COMMUNITY)
Admission: RE | Admit: 2022-08-31 | Discharge: 2022-08-31 | Disposition: A | Discharge status: Home / Self Care | Payer: Medicare Other | Source: Ambulatory Visit | Attending: Neurosurgery | Admitting: Neurosurgery

## 2022-08-31 ENCOUNTER — Encounter (HOSPITAL_COMMUNITY): Payer: Self-pay

## 2022-08-31 VITALS — BP 135/80 | HR 61 | Temp 97.6°F | Resp 17 | Ht 64.0 in | Wt 176.1 lb

## 2022-08-31 DIAGNOSIS — Z01818 Encounter for other preprocedural examination: Secondary | ICD-10-CM

## 2022-08-31 DIAGNOSIS — E119 Type 2 diabetes mellitus without complications: Secondary | ICD-10-CM

## 2022-08-31 DIAGNOSIS — Z01812 Encounter for preprocedural laboratory examination: Secondary | ICD-10-CM | POA: Diagnosis not present

## 2022-08-31 LAB — BASIC METABOLIC PANEL
Anion gap: 8 (ref 5–15)
BUN: 21 mg/dL — ABNORMAL HIGH (ref 6–20)
CO2: 33 mmol/L — ABNORMAL HIGH (ref 22–32)
Calcium: 8.8 mg/dL — ABNORMAL LOW (ref 8.9–10.3)
Chloride: 99 mmol/L (ref 98–111)
Creatinine, Ser: 0.63 mg/dL (ref 0.44–1.00)
GFR, Estimated: >60 mL/min (ref >60)
Glucose, Bld: 90 mg/dL (ref 70–99)
Potassium: 4.4 mmol/L (ref 3.5–5.1)
Sodium: 140 mmol/L (ref 135–145)

## 2022-08-31 LAB — TYPE AND SCREEN
ABO/RH(D): AB POS
Antibody Screen: NEGATIVE

## 2022-08-31 LAB — CBC
HCT: 39.1 % (ref 36.0–46.0)
Hemoglobin: 12.8 g/dL (ref 12.0–15.0)
MCH: 30.4 pg (ref 26.0–34.0)
MCHC: 32.7 g/dL (ref 30.0–36.0)
MCV: 92.9 fL (ref 80.0–100.0)
Platelets: 218 10*3/uL (ref 150–400)
RBC: 4.21 MIL/uL (ref 3.87–5.11)
RDW: 13.4 % (ref 11.5–15.5)
WBC: 4.4 10*3/uL (ref 4.0–10.5)
nRBC: 0 % (ref 0.0–0.2)

## 2022-08-31 LAB — GLUCOSE, CAPILLARY: Glucose-Capillary: 85 mg/dL (ref 70–99)

## 2022-08-31 LAB — SURGICAL PCR SCREEN
MRSA, PCR: NEGATIVE
Staphylococcus aureus: POSITIVE — AB

## 2022-08-31 NOTE — Progress Notes (Signed)
PCP - Dr. Ronnie Doss Cardiologist - denies  PPM/ICD - denies   Chest x-ray - 05/19/19 EKG - 07/26/22 Stress Test - 09/21/16 ECHO - 07/23/18 Cardiac Cath - denies  Sleep Study - 5-10 years ago per pt, OSA+. Pt had gastric surgery and lost weight. She says she no longer has OSA CPAP - denies  DM- Type 2 Fasting Blood Sugar - 80-100 Checks Blood Sugar 4 times a day  ASA/Blood Thinner Instructions: n/a   ERAS Protcol - no, NPO   COVID TEST- n/a   Anesthesia review: no  Patient denies shortness of breath, fever, cough and chest pain at PAT appointment   All instructions explained to the patient, with a verbal understanding of the material. Patient agrees to go over the instructions while at home for a better understanding. The opportunity to ask questions was provided.

## 2022-09-09 ENCOUNTER — Ambulatory Visit (HOSPITAL_BASED_OUTPATIENT_CLINIC_OR_DEPARTMENT_OTHER): Payer: Medicare Other | Admitting: Certified Registered"

## 2022-09-09 ENCOUNTER — Ambulatory Visit (HOSPITAL_COMMUNITY): Payer: Medicare Other

## 2022-09-09 ENCOUNTER — Encounter (HOSPITAL_COMMUNITY)
Admission: RE | Disposition: A | Discharge status: Home / Self Care | Payer: Self-pay | Source: Ambulatory Visit | Attending: Neurosurgery

## 2022-09-09 ENCOUNTER — Ambulatory Visit (HOSPITAL_COMMUNITY): Payer: Medicare Other | Admitting: Vascular Surgery

## 2022-09-09 ENCOUNTER — Other Ambulatory Visit: Payer: Self-pay

## 2022-09-09 ENCOUNTER — Encounter (HOSPITAL_COMMUNITY): Payer: Self-pay | Admitting: Neurosurgery

## 2022-09-09 ENCOUNTER — Observation Stay (HOSPITAL_COMMUNITY)
Admission: RE | Admit: 2022-09-09 | Discharge: 2022-09-10 | Disposition: A | Discharge status: Home / Self Care | Payer: Medicare Other | Source: Ambulatory Visit | Attending: Neurosurgery | Admitting: Neurosurgery

## 2022-09-09 DIAGNOSIS — E119 Type 2 diabetes mellitus without complications: Secondary | ICD-10-CM | POA: Diagnosis not present

## 2022-09-09 DIAGNOSIS — M48061 Spinal stenosis, lumbar region without neurogenic claudication: Secondary | ICD-10-CM | POA: Diagnosis not present

## 2022-09-09 DIAGNOSIS — M5416 Radiculopathy, lumbar region: Secondary | ICD-10-CM | POA: Diagnosis not present

## 2022-09-09 DIAGNOSIS — Z96612 Presence of left artificial shoulder joint: Secondary | ICD-10-CM | POA: Diagnosis not present

## 2022-09-09 DIAGNOSIS — E039 Hypothyroidism, unspecified: Secondary | ICD-10-CM | POA: Insufficient documentation

## 2022-09-09 DIAGNOSIS — Z981 Arthrodesis status: Secondary | ICD-10-CM | POA: Diagnosis not present

## 2022-09-09 DIAGNOSIS — M4726 Other spondylosis with radiculopathy, lumbar region: Secondary | ICD-10-CM | POA: Diagnosis not present

## 2022-09-09 DIAGNOSIS — Z79899 Other long term (current) drug therapy: Secondary | ICD-10-CM | POA: Insufficient documentation

## 2022-09-09 DIAGNOSIS — M47816 Spondylosis without myelopathy or radiculopathy, lumbar region: Secondary | ICD-10-CM

## 2022-09-09 DIAGNOSIS — Z794 Long term (current) use of insulin: Secondary | ICD-10-CM | POA: Insufficient documentation

## 2022-09-09 DIAGNOSIS — Z9889 Other specified postprocedural states: Secondary | ICD-10-CM | POA: Diagnosis not present

## 2022-09-09 DIAGNOSIS — I10 Essential (primary) hypertension: Secondary | ICD-10-CM | POA: Diagnosis not present

## 2022-09-09 DIAGNOSIS — M4306 Spondylolysis, lumbar region: Secondary | ICD-10-CM | POA: Diagnosis present

## 2022-09-09 LAB — GLUCOSE, CAPILLARY
Glucose-Capillary: 89 mg/dL (ref 70–99)
Glucose-Capillary: 140 mg/dL — ABNORMAL HIGH (ref 70–99)
Glucose-Capillary: 159 mg/dL — ABNORMAL HIGH (ref 70–99)
Glucose-Capillary: 127 mg/dL — ABNORMAL HIGH (ref 70–99)

## 2022-09-09 LAB — HEMOGLOBIN A1C
Hgb A1c MFr Bld: 5.4 % (ref 4.8–5.6)
Mean Plasma Glucose: 108.28 mg/dL

## 2022-09-09 SURGERY — POSTERIOR LUMBAR FUSION 1 LEVEL
Anesthesia: General

## 2022-09-09 MED ORDER — 0.9 % SODIUM CHLORIDE (POUR BTL) OPTIME
TOPICAL | Status: DC | PRN
  Administered 2022-09-09: 1000 mL

## 2022-09-09 MED ORDER — ROCURONIUM BROMIDE 10 MG/ML (PF) SYRINGE
PREFILLED_SYRINGE | INTRAVENOUS | Status: AC
  Filled 2022-09-09: qty 10

## 2022-09-09 MED ORDER — BUPIVACAINE HCL (PF) 0.5 % IJ SOLN
INTRAMUSCULAR | Status: AC
  Filled 2022-09-09: qty 30

## 2022-09-09 MED ORDER — METHOCARBAMOL 500 MG PO TABS
500.0000 mg | ORAL_TABLET | Freq: Four times a day (QID) | ORAL | Status: DC | PRN
  Administered 2022-09-09 (×2): 500 mg via ORAL
  Filled 2022-09-09 (×3): qty 1

## 2022-09-09 MED ORDER — LEVOTHYROXINE SODIUM 112 MCG PO TABS
112.0000 ug | ORAL_TABLET | Freq: Every day | ORAL | Status: DC
  Administered 2022-09-10: 112 ug via ORAL
  Filled 2022-09-09 (×4): qty 1

## 2022-09-09 MED ORDER — EZETIMIBE 10 MG PO TABS: 10.0000 mg | ORAL_TABLET | Freq: Every day | ORAL | Status: DC

## 2022-09-09 MED ORDER — BUPIVACAINE HCL (PF) 0.5 % IJ SOLN
INTRAMUSCULAR | Status: DC | PRN
  Administered 2022-09-09: 5 mL

## 2022-09-09 MED ORDER — OXYCODONE HCL 5 MG PO TABS: 5.0000 mg | ORAL_TABLET | ORAL | Status: DC | PRN

## 2022-09-09 MED ORDER — ACETAMINOPHEN 10 MG/ML IV SOLN: 1000.0000 mg | Freq: Once | INTRAVENOUS | Status: DC | PRN

## 2022-09-09 MED ORDER — LIDOCAINE-EPINEPHRINE 1 %-1:100000 IJ SOLN
INTRAMUSCULAR | Status: DC | PRN
  Administered 2022-09-09: 5 mL

## 2022-09-09 MED ORDER — MIDAZOLAM HCL 2 MG/2ML IJ SOLN
INTRAMUSCULAR | Status: AC
  Filled 2022-09-09: qty 2

## 2022-09-09 MED ORDER — SERTRALINE HCL 50 MG PO TABS: 200.0000 mg | ORAL_TABLET | Freq: Every day | ORAL | Status: DC

## 2022-09-09 MED ORDER — LINACLOTIDE 145 MCG PO CAPS
145.0000 ug | ORAL_CAPSULE | Freq: Every day | ORAL | Status: DC
  Filled 2022-09-09: qty 1

## 2022-09-09 MED ORDER — ACETAMINOPHEN 160 MG/5ML PO SOLN: 325.0000 mg | Freq: Once | ORAL | Status: DC | PRN

## 2022-09-09 MED ORDER — PROPOFOL 10 MG/ML IV BOLUS
INTRAVENOUS | Status: AC
  Filled 2022-09-09: qty 20

## 2022-09-09 MED ORDER — SODIUM CHLORIDE 0.9 % IV SOLN: 250.0000 mL | INTRAVENOUS | Status: DC

## 2022-09-09 MED ORDER — DOCUSATE SODIUM 100 MG PO CAPS
100.0000 mg | ORAL_CAPSULE | Freq: Two times a day (BID) | ORAL | Status: DC
  Administered 2022-09-09: 100 mg via ORAL
  Filled 2022-09-09: qty 1

## 2022-09-09 MED ORDER — INSULIN ASPART 100 UNIT/ML IJ SOLN: 0.0000 [IU] | Freq: Three times a day (TID) | INTRAMUSCULAR | Status: DC

## 2022-09-09 MED ORDER — ZIPRASIDONE HCL 40 MG PO CAPS
60.0000 mg | ORAL_CAPSULE | Freq: Every day | ORAL | Status: DC
  Administered 2022-09-09: 60 mg via ORAL
  Filled 2022-09-09: qty 1

## 2022-09-09 MED ORDER — ACETAMINOPHEN 325 MG PO TABS: 650.0000 mg | ORAL_TABLET | ORAL | Status: DC | PRN

## 2022-09-09 MED ORDER — PHENYLEPHRINE HCL-NACL 20-0.9 MG/250ML-% IV SOLN
INTRAVENOUS | Status: DC | PRN
  Administered 2022-09-09: 25 ug/min via INTRAVENOUS

## 2022-09-09 MED ORDER — BISACODYL 10 MG RE SUPP: 10.0000 mg | Freq: Every day | RECTAL | Status: DC | PRN

## 2022-09-09 MED ORDER — LIDOCAINE-EPINEPHRINE 1 %-1:100000 IJ SOLN
INTRAMUSCULAR | Status: AC
  Filled 2022-09-09: qty 1

## 2022-09-09 MED ORDER — PROMETHAZINE HCL 25 MG/ML IJ SOLN: 6.2500 mg | INTRAMUSCULAR | Status: DC | PRN

## 2022-09-09 MED ORDER — LACTATED RINGERS IV SOLN: INTRAVENOUS | Status: DC

## 2022-09-09 MED ORDER — CHLORHEXIDINE GLUCONATE 0.12 % MT SOLN
OROMUCOSAL | Status: AC
  Administered 2022-09-09: 15 mL via OROMUCOSAL
  Filled 2022-09-09: qty 15

## 2022-09-09 MED ORDER — OXYCODONE HCL 10 MG PO TABS: 10.0000 mg | ORAL_TABLET | Freq: Four times a day (QID) | ORAL | 0 refills | Status: DC | PRN

## 2022-09-09 MED ORDER — ONDANSETRON HCL 4 MG/2ML IJ SOLN
INTRAMUSCULAR | Status: AC
  Filled 2022-09-09: qty 2

## 2022-09-09 MED ORDER — PHENYLEPHRINE 80 MCG/ML (10ML) SYRINGE FOR IV PUSH (FOR BLOOD PRESSURE SUPPORT)
PREFILLED_SYRINGE | INTRAVENOUS | Status: AC
  Filled 2022-09-09: qty 10

## 2022-09-09 MED ORDER — ORAL CARE MOUTH RINSE: 15.0000 mL | Freq: Once | OROMUCOSAL | Status: AC

## 2022-09-09 MED ORDER — POLYVINYL ALCOHOL 1.4 % OP SOLN: 1.0000 [drp] | Freq: Three times a day (TID) | OPHTHALMIC | Status: DC | PRN

## 2022-09-09 MED ORDER — LIDOCAINE 2% (20 MG/ML) 5 ML SYRINGE
INTRAMUSCULAR | Status: DC | PRN
  Administered 2022-09-09: 60 mg via INTRAVENOUS

## 2022-09-09 MED ORDER — ACETAMINOPHEN 650 MG RE SUPP: 650.0000 mg | RECTAL | Status: DC | PRN

## 2022-09-09 MED ORDER — DEXAMETHASONE SODIUM PHOSPHATE 10 MG/ML IJ SOLN
INTRAMUSCULAR | Status: DC | PRN
  Administered 2022-09-09: 5 mg via INTRAVENOUS

## 2022-09-09 MED ORDER — CEFAZOLIN SODIUM-DEXTROSE 2-4 GM/100ML-% IV SOLN
2.0000 g | Freq: Three times a day (TID) | INTRAVENOUS | Status: AC
  Administered 2022-09-09 (×2): 2 g via INTRAVENOUS
  Filled 2022-09-09 (×2): qty 100

## 2022-09-09 MED ORDER — DEXAMETHASONE SODIUM PHOSPHATE 10 MG/ML IJ SOLN
INTRAMUSCULAR | Status: AC
  Filled 2022-09-09: qty 1

## 2022-09-09 MED ORDER — PHENOL 1.4 % MT LIQD: 1.0000 | OROMUCOSAL | Status: DC | PRN

## 2022-09-09 MED ORDER — ROCURONIUM BROMIDE 10 MG/ML (PF) SYRINGE
PREFILLED_SYRINGE | INTRAVENOUS | Status: DC | PRN
  Administered 2022-09-09: 20 mg via INTRAVENOUS
  Administered 2022-09-09: 80 mg via INTRAVENOUS
  Administered 2022-09-09: 20 mg via INTRAVENOUS
  Administered 2022-09-09 (×2): 30 mg via INTRAVENOUS

## 2022-09-09 MED ORDER — PANTOPRAZOLE SODIUM 40 MG IV SOLR
40.0000 mg | Freq: Every day | INTRAVENOUS | Status: DC
  Administered 2022-09-09: 40 mg via INTRAVENOUS
  Filled 2022-09-09: qty 10

## 2022-09-09 MED ORDER — MORPHINE SULFATE (PF) 2 MG/ML IV SOLN: 2.0000 mg | INTRAVENOUS | Status: DC | PRN

## 2022-09-09 MED ORDER — GABAPENTIN 300 MG PO CAPS
600.0000 mg | ORAL_CAPSULE | Freq: Three times a day (TID) | ORAL | Status: DC
  Administered 2022-09-09 (×2): 600 mg via ORAL
  Filled 2022-09-09 (×2): qty 2

## 2022-09-09 MED ORDER — ONDANSETRON HCL 4 MG/2ML IJ SOLN
INTRAMUSCULAR | Status: DC | PRN
  Administered 2022-09-09: 4 mg via INTRAVENOUS

## 2022-09-09 MED ORDER — ACETAMINOPHEN 325 MG PO TABS: 325.0000 mg | ORAL_TABLET | Freq: Once | ORAL | Status: DC | PRN

## 2022-09-09 MED ORDER — CEFAZOLIN SODIUM-DEXTROSE 2-4 GM/100ML-% IV SOLN
INTRAVENOUS | Status: AC
  Filled 2022-09-09: qty 100

## 2022-09-09 MED ORDER — MIRABEGRON ER 25 MG PO TB24
25.0000 mg | ORAL_TABLET | Freq: Every day | ORAL | Status: DC
  Administered 2022-09-09: 25 mg via ORAL
  Filled 2022-09-09 (×2): qty 1

## 2022-09-09 MED ORDER — AMISULPRIDE (ANTIEMETIC) 5 MG/2ML IV SOLN: 10.0000 mg | Freq: Once | INTRAVENOUS | Status: DC | PRN

## 2022-09-09 MED ORDER — ALBUMIN HUMAN 5 % IV SOLN: INTRAVENOUS | Status: DC | PRN

## 2022-09-09 MED ORDER — THROMBIN 5000 UNITS EX SOLR
OROMUCOSAL | Status: DC | PRN
  Administered 2022-09-09: 5 mL via TOPICAL

## 2022-09-09 MED ORDER — SODIUM CHLORIDE 0.9 % IV SOLN: INTRAVENOUS | Status: DC

## 2022-09-09 MED ORDER — HYDROMORPHONE HCL 1 MG/ML IJ SOLN
INTRAMUSCULAR | Status: AC
  Filled 2022-09-09: qty 1

## 2022-09-09 MED ORDER — THROMBIN 5000 UNITS EX SOLR
CUTANEOUS | Status: AC
  Filled 2022-09-09: qty 5000

## 2022-09-09 MED ORDER — CHLORHEXIDINE GLUCONATE 0.12 % MT SOLN: 15.0000 mL | Freq: Once | OROMUCOSAL | Status: AC

## 2022-09-09 MED ORDER — CHLORHEXIDINE GLUCONATE CLOTH 2 % EX PADS: 6.0000 | MEDICATED_PAD | Freq: Once | CUTANEOUS | Status: DC

## 2022-09-09 MED ORDER — METHOCARBAMOL 1000 MG/10ML IJ SOLN: 500.0000 mg | Freq: Four times a day (QID) | INTRAVENOUS | Status: DC | PRN

## 2022-09-09 MED ORDER — ONDANSETRON HCL 4 MG PO TABS: 4.0000 mg | ORAL_TABLET | Freq: Four times a day (QID) | ORAL | Status: DC | PRN

## 2022-09-09 MED ORDER — SUMATRIPTAN SUCCINATE 25 MG PO TABS: 12.5000 mg | ORAL_TABLET | ORAL | Status: DC | PRN

## 2022-09-09 MED ORDER — FENTANYL CITRATE (PF) 250 MCG/5ML IJ SOLN
INTRAMUSCULAR | Status: AC
  Filled 2022-09-09: qty 5

## 2022-09-09 MED ORDER — SUGAMMADEX SODIUM 200 MG/2ML IV SOLN
INTRAVENOUS | Status: DC | PRN
  Administered 2022-09-09: 200 mg via INTRAVENOUS

## 2022-09-09 MED ORDER — SIMETHICONE 80 MG PO CHEW
125.0000 mg | CHEWABLE_TABLET | Freq: Three times a day (TID) | ORAL | Status: DC
  Administered 2022-09-10: 120 mg via ORAL
  Filled 2022-09-09 (×3): qty 2

## 2022-09-09 MED ORDER — DIVALPROEX SODIUM 500 MG PO DR TAB
1000.0000 mg | DELAYED_RELEASE_TABLET | Freq: Every evening | ORAL | Status: DC
  Administered 2022-09-09: 1000 mg via ORAL
  Filled 2022-09-09: qty 2

## 2022-09-09 MED ORDER — FENTANYL CITRATE (PF) 100 MCG/2ML IJ SOLN
INTRAMUSCULAR | Status: DC | PRN
  Administered 2022-09-09 (×3): 50 ug via INTRAVENOUS

## 2022-09-09 MED ORDER — INSULIN ASPART 100 UNIT/ML IJ SOLN: 0.0000 [IU] | INTRAMUSCULAR | Status: DC | PRN

## 2022-09-09 MED ORDER — MIDAZOLAM HCL 2 MG/2ML IJ SOLN
INTRAMUSCULAR | Status: DC | PRN
  Administered 2022-09-09: 2 mg via INTRAVENOUS

## 2022-09-09 MED ORDER — FLUTICASONE PROPIONATE 50 MCG/ACT NA SUSP
2.0000 | Freq: Every day | NASAL | Status: DC
  Filled 2022-09-09: qty 16

## 2022-09-09 MED ORDER — LORATADINE 10 MG PO TABS: 10.0000 mg | ORAL_TABLET | Freq: Every day | ORAL | Status: DC | PRN

## 2022-09-09 MED ORDER — LIDOCAINE 2% (20 MG/ML) 5 ML SYRINGE
INTRAMUSCULAR | Status: AC
  Filled 2022-09-09: qty 5

## 2022-09-09 MED ORDER — HYDROMORPHONE HCL 1 MG/ML IJ SOLN
0.2500 mg | INTRAMUSCULAR | Status: DC | PRN
  Administered 2022-09-09 (×4): 0.5 mg via INTRAVENOUS

## 2022-09-09 MED ORDER — EPHEDRINE 5 MG/ML INJ
INTRAVENOUS | Status: AC
  Filled 2022-09-09: qty 5

## 2022-09-09 MED ORDER — MENTHOL 3 MG MT LOZG: 1.0000 | LOZENGE | OROMUCOSAL | Status: DC | PRN

## 2022-09-09 MED ORDER — DIVALPROEX SODIUM 500 MG PO DR TAB
500.0000 mg | DELAYED_RELEASE_TABLET | Freq: Every morning | ORAL | Status: DC
  Administered 2022-09-10: 500 mg via ORAL
  Filled 2022-09-09: qty 1

## 2022-09-09 MED ORDER — SODIUM CHLORIDE 0.9% FLUSH
3.0000 mL | Freq: Two times a day (BID) | INTRAVENOUS | Status: DC
  Administered 2022-09-09 (×2): 3 mL via INTRAVENOUS

## 2022-09-09 MED ORDER — MEPERIDINE HCL 25 MG/ML IJ SOLN: 6.2500 mg | INTRAMUSCULAR | Status: DC | PRN

## 2022-09-09 MED ORDER — EPHEDRINE SULFATE-NACL 50-0.9 MG/10ML-% IV SOSY
PREFILLED_SYRINGE | INTRAVENOUS | Status: DC | PRN
  Administered 2022-09-09: 10 mg via INTRAVENOUS
  Administered 2022-09-09: 5 mg via INTRAVENOUS

## 2022-09-09 MED ORDER — DICYCLOMINE HCL 20 MG PO TABS
20.0000 mg | ORAL_TABLET | Freq: Three times a day (TID) | ORAL | Status: DC
  Administered 2022-09-09 – 2022-09-10 (×2): 20 mg via ORAL
  Filled 2022-09-09 (×3): qty 1

## 2022-09-09 MED ORDER — SODIUM CHLORIDE 0.9% FLUSH: 3.0000 mL | INTRAVENOUS | Status: DC | PRN

## 2022-09-09 MED ORDER — POLYETHYLENE GLYCOL 3350 17 G PO PACK
17.0000 g | PACK | Freq: Every day | ORAL | Status: DC
  Administered 2022-09-09: 17 g via ORAL

## 2022-09-09 MED ORDER — SENNA 8.6 MG PO TABS
1.0000 | ORAL_TABLET | Freq: Two times a day (BID) | ORAL | Status: DC
  Administered 2022-09-09: 8.6 mg via ORAL
  Filled 2022-09-09: qty 1

## 2022-09-09 MED ORDER — PROPOFOL 10 MG/ML IV BOLUS
INTRAVENOUS | Status: DC | PRN
  Administered 2022-09-09: 130 mg via INTRAVENOUS

## 2022-09-09 MED ORDER — CEFAZOLIN SODIUM-DEXTROSE 2-4 GM/100ML-% IV SOLN
2.0000 g | INTRAVENOUS | Status: AC
  Administered 2022-09-09: 2 g via INTRAVENOUS

## 2022-09-09 MED ORDER — ONDANSETRON HCL 4 MG/2ML IJ SOLN: 4.0000 mg | Freq: Four times a day (QID) | INTRAMUSCULAR | Status: DC | PRN

## 2022-09-09 MED ORDER — PHENYLEPHRINE 80 MCG/ML (10ML) SYRINGE FOR IV PUSH (FOR BLOOD PRESSURE SUPPORT)
PREFILLED_SYRINGE | INTRAVENOUS | Status: DC | PRN
  Administered 2022-09-09: 80 ug via INTRAVENOUS
  Administered 2022-09-09 (×2): 160 ug via INTRAVENOUS
  Administered 2022-09-09: 240 ug via INTRAVENOUS

## 2022-09-09 MED ORDER — OXYCODONE HCL 5 MG PO TABS
10.0000 mg | ORAL_TABLET | ORAL | Status: DC | PRN
  Administered 2022-09-09 – 2022-09-10 (×5): 10 mg via ORAL
  Filled 2022-09-09 (×5): qty 2

## 2022-09-09 SURGICAL SUPPLY — 74 items
ADH SKN CLS APL DERMABOND .7 (GAUZE/BANDAGES/DRESSINGS) ×1
APL SKNCLS STERI-STRIP NONHPOA (GAUZE/BANDAGES/DRESSINGS)
BAG COUNTER SPONGE SURGICOUNT (BAG) ×1 IMPLANT
BAG SPNG CNTER NS LX DISP (BAG) ×1
BASKET BONE COLLECTION (BASKET) ×1 IMPLANT
BENZOIN TINCTURE PRP APPL 2/3 (GAUZE/BANDAGES/DRESSINGS) IMPLANT
BLADE BONE MILL MEDIUM (MISCELLANEOUS) IMPLANT
BLADE CLIPPER SURG (BLADE) IMPLANT
BLADE SURG 11 STRL SS (BLADE) ×1 IMPLANT
BUR MATCHSTICK NEURO 3.0 LAGG (BURR) ×1 IMPLANT
BUR PRECISION FLUTE 5.0 (BURR) ×1 IMPLANT
CAGE EXP CATALYFT 9 (Plate) IMPLANT
CANISTER SUCT 3000ML PPV (MISCELLANEOUS) ×1 IMPLANT
CNTNR URN SCR LID CUP LEK RST (MISCELLANEOUS) ×1 IMPLANT
CONT SPEC 4OZ STRL OR WHT (MISCELLANEOUS) ×1
COVER BACK TABLE 60X90IN (DRAPES) ×1 IMPLANT
DERMABOND ADVANCED .7 DNX12 (GAUZE/BANDAGES/DRESSINGS) ×1 IMPLANT
DRAPE C-ARM 42X72 X-RAY (DRAPES) ×1 IMPLANT
DRAPE C-ARMOR (DRAPES) ×1 IMPLANT
DRAPE LAPAROTOMY 100X72X124 (DRAPES) ×1 IMPLANT
DRAPE SURG 17X23 STRL (DRAPES) ×1 IMPLANT
DRSG OPSITE POSTOP 4X6 (GAUZE/BANDAGES/DRESSINGS) IMPLANT
DURAPREP 26ML APPLICATOR (WOUND CARE) ×1 IMPLANT
ELECT REM PT RETURN 9FT ADLT (ELECTROSURGICAL) ×1
ELECTRODE REM PT RTRN 9FT ADLT (ELECTROSURGICAL) ×1 IMPLANT
GAUZE 4X4 16PLY ~~LOC~~+RFID DBL (SPONGE) IMPLANT
GAUZE SPONGE 4X4 12PLY STRL (GAUZE/BANDAGES/DRESSINGS) IMPLANT
GLOVE BIO SURGEON STRL SZ 6.5 (GLOVE) IMPLANT
GLOVE BIO SURGEON STRL SZ7.5 (GLOVE) IMPLANT
GLOVE BIOGEL PI IND STRL 7.5 (GLOVE) ×2 IMPLANT
GLOVE ECLIPSE 6.5 STRL STRAW (GLOVE) IMPLANT
GLOVE ECLIPSE 7.0 STRL STRAW (GLOVE) ×2 IMPLANT
GLOVE EXAM NITRILE XL STR (GLOVE) IMPLANT
GLOVE INDICATOR 7.5 STRL GRN (GLOVE) IMPLANT
GLOVE SURG SS PI 7.0 STRL IVOR (GLOVE) IMPLANT
GOWN STRL REUS W/ TWL LRG LVL3 (GOWN DISPOSABLE) ×4 IMPLANT
GOWN STRL REUS W/ TWL XL LVL3 (GOWN DISPOSABLE) IMPLANT
GOWN STRL REUS W/TWL 2XL LVL3 (GOWN DISPOSABLE) IMPLANT
GOWN STRL REUS W/TWL LRG LVL3 (GOWN DISPOSABLE) ×5
GOWN STRL REUS W/TWL XL LVL3 (GOWN DISPOSABLE)
GRAFT BONE PROTEIOS XS 0.5CC (Orthopedic Implant) IMPLANT
HEMOSTAT POWDER KIT SURGIFOAM (HEMOSTASIS) ×1 IMPLANT
KIT BASIN OR (CUSTOM PROCEDURE TRAY) ×1 IMPLANT
KIT POSITION SURG JACKSON T1 (MISCELLANEOUS) ×1 IMPLANT
KIT TURNOVER KIT B (KITS) ×1 IMPLANT
MILL BONE PREP (MISCELLANEOUS) ×1 IMPLANT
NDL HYPO 18GX1.5 BLUNT FILL (NEEDLE) IMPLANT
NDL SPNL 18GX3.5 QUINCKE PK (NEEDLE) IMPLANT
NEEDLE HYPO 18GX1.5 BLUNT FILL (NEEDLE) IMPLANT
NEEDLE HYPO 22GX1.5 SAFETY (NEEDLE) ×1 IMPLANT
NEEDLE SPNL 18GX3.5 QUINCKE PK (NEEDLE) IMPLANT
NS IRRIG 1000ML POUR BTL (IV SOLUTION) ×1 IMPLANT
PACK LAMINECTOMY NEURO (CUSTOM PROCEDURE TRAY) ×1 IMPLANT
PAD ARMBOARD 7.5X6 YLW CONV (MISCELLANEOUS) ×3 IMPLANT
PUTTY GRAFTON DBF 6CC W/DELIVE (Putty) IMPLANT
ROD SOLERA 55MM (Rod) IMPLANT
ROD SOLERA 60MM (Rod) IMPLANT
SCREW 5.5X35MM (Screw) ×2 IMPLANT
SCREW BN 35X5.5XMA NS SPNE (Screw) IMPLANT
SCREW SET SOLERA (Screw) ×6 IMPLANT
SCREW SET SOLERA TI (Screw) IMPLANT
SPIKE FLUID TRANSFER (MISCELLANEOUS) ×1 IMPLANT
SPONGE SURGIFOAM ABS GEL 100 (HEMOSTASIS) IMPLANT
SPONGE T-LAP 4X18 ~~LOC~~+RFID (SPONGE) IMPLANT
STRIP CLOSURE SKIN 1/2X4 (GAUZE/BANDAGES/DRESSINGS) IMPLANT
SUT VIC AB 0 CT1 18XCR BRD8 (SUTURE) ×1 IMPLANT
SUT VIC AB 0 CT1 8-18 (SUTURE) ×2
SUT VICRYL 3-0 RB1 18 ABS (SUTURE) ×1 IMPLANT
SYR 3ML LL SCALE MARK (SYRINGE) ×3 IMPLANT
TOWEL GREEN STERILE (TOWEL DISPOSABLE) ×1 IMPLANT
TOWEL GREEN STERILE FF (TOWEL DISPOSABLE) ×1 IMPLANT
TRAY FOLEY MTR SLVR 16FR STAT (SET/KITS/TRAYS/PACK) ×1 IMPLANT
TRAY FOLEY W/BAG SLVR 14FR (SET/KITS/TRAYS/PACK) IMPLANT
WATER STERILE IRR 1000ML POUR (IV SOLUTION) ×1 IMPLANT

## 2022-09-09 NOTE — Discharge Instructions (Signed)
Wound Care Remove dressing in 3 days Leave incision open to air. You may shower. Do not scrub directly on incision.  Do not put any creams, lotions, or ointments on incision. Activity Walk each and every day, increasing distance each day. No lifting greater than 5 lbs.  Avoid bending, arching, and twisting. No driving for 2 weeks; may ride as a passenger locally. If provided with back brace, wear when out of bed.  It is not necessary to wear in bed. Diet Resume your normal diet.  Return to Work Will be discussed at you follow up appointment. Call Your Doctor If Any of These Occur Redness, drainage, or swelling at the wound.  Temperature greater than 101 degrees. Severe pain not relieved by pain medication. Incision starts to come apart. Follow Up Appt Call today for appointment in 2 weeks [phone]) or for problems.  If you have any hardware placed in your spine, you will need an x-ray before your appointment.  

## 2022-09-09 NOTE — Anesthesia Postprocedure Evaluation (Signed)
Anesthesia Post Note  Patient: Morgan [name]  Procedure(s) Performed: Posterior Lumbar Interbody Fusion Lumbar Three-Four, EXTENSION OF FUSION Lumbar Three- Four, Lumbar Four-Five     Patient location during evaluation: PACU Anesthesia Type: General Level of consciousness: awake and alert Pain management: pain level controlled Vital Signs Assessment: post-procedure vital signs reviewed and stable Respiratory status: spontaneous breathing, nonlabored ventilation, respiratory function stable and patient connected to nasal cannula oxygen Cardiovascular status: blood pressure returned to baseline and stable Postop Assessment: no apparent nausea or vomiting Anesthetic complications: no   No notable events documented.  Last Vitals:  Vitals:   09/09/22 1343 09/09/22 1407  BP: 102/64 98/67  Pulse: 75 84  Resp: 15 18  Temp: 37.1 C 36.6 C  SpO2: 99% 94%    Last Pain:  Vitals:   09/09/22 1407  TempSrc: Oral  PainSc:                  Effie Berkshire

## 2022-09-09 NOTE — Anesthesia Procedure Notes (Signed)
Procedure Name: Intubation Date/Time: 09/09/2022 8:07 AM  Performed by: Barrington Ellison, CRNAPre-anesthesia Checklist: Patient identified, Emergency Drugs available, Suction available and Patient being monitored Patient Re-evaluated:Patient Re-evaluated prior to induction Oxygen Delivery Method: Circle System Utilized Preoxygenation: Pre-oxygenation with 100% oxygen Induction Type: IV induction Ventilation: Mask ventilation without difficulty Laryngoscope Size: Mac and 3 Grade View: Grade II Tube type: Oral Tube size: 7.0 mm Number of attempts: 1 Airway Equipment and Method: Stylet and Oral airway Placement Confirmation: ETT inserted through vocal cords under direct vision, positive ETCO2 and breath sounds checked- equal and bilateral Secured at: 20 cm Tube secured with: Tape Dental Injury: Teeth and Oropharynx as per pre-operative assessment

## 2022-09-09 NOTE — Anesthesia Preprocedure Evaluation (Addendum)
Anesthesia Evaluation  Patient identified by MRN, date of birth, ID band Patient awake    Reviewed: Allergy & Precautions, NPO status , Patient's Chart, lab work & pertinent test results  Airway Mallampati: III  TM Distance: >3 FB Neck ROM: Full    Dental  (+) Teeth Intact, Dental Advisory Given, Poor Dentition   Pulmonary sleep apnea ,    breath sounds clear to auscultation       Cardiovascular hypertension,  Rhythm:Regular Rate:Normal     Neuro/Psych  Headaches, PSYCHIATRIC DISORDERS Anxiety Depression Bipolar Disorder  Neuromuscular disease    GI/Hepatic Neg liver ROS, hiatal hernia, GERD  ,  Endo/Other  diabetesHypothyroidism   Renal/GU negative Renal ROS     Musculoskeletal  (+) Arthritis ,   Abdominal Normal abdominal exam  (+)   Peds  Hematology   Anesthesia Other Findings   Reproductive/Obstetrics                            Anesthesia Physical Anesthesia Plan  ASA: 2  Anesthesia Plan: General   Post-op Pain Management:    Induction: Intravenous  PONV Risk Score and Plan: 4 or greater and Ondansetron, Dexamethasone, Midazolam and Scopolamine patch - Pre-op  Airway Management Planned: Oral ETT  Additional Equipment: None  Intra-op Plan:   Post-operative Plan: Extubation in OR  Informed Consent: I have reviewed the patients History and Physical, chart, labs and discussed the procedure including the risks, benefits and alternatives for the proposed anesthesia with the patient or authorized representative who has indicated his/her understanding and acceptance.     Dental advisory given  Plan Discussed with: CRNA  Anesthesia Plan Comments:        Anesthesia Quick Evaluation

## 2022-09-09 NOTE — Transfer of Care (Signed)
Immediate Anesthesia Transfer of Care Note  Patient: Morgan [name]  Procedure(s) Performed: Posterior Lumbar Interbody Fusion Lumbar Three-Four, EXTENSION OF FUSION Lumbar Three- Four, Lumbar Four-Five  Patient Location: PACU  Anesthesia Type:General  Level of Consciousness: awake and oriented  Airway & Oxygen Therapy: Patient Spontanous Breathing and Patient connected to nasal cannula oxygen  Post-op Assessment: Report given to RN  Post vital signs: Reviewed and stable  Last Vitals:  Vitals Value Taken Time  BP 108/75 09/09/22 1213  Temp    Pulse 72 09/09/22 1215  Resp 13 09/09/22 1215  SpO2 95 % 09/09/22 1215  Vitals shown include unvalidated device data.  Last Pain:  Vitals:   09/09/22 0627  PainSc: 8          Complications: No notable events documented.

## 2022-09-09 NOTE — Op Note (Signed)
NEUROSURGERY OPERATIVE NOTE   PREOP DIAGNOSIS:  1. Lumbar spondylosis L3-4 2. Adjacent level stenosis, L3-4  POSTOP DIAGNOSIS: Same  PROCEDURE: 1. L3 laminectomy with facetectomy for decompression of exiting nerve roots, more than would be required for placement of interbody graft 2. Placement of anterior interbody device - [employer]onic expandable 84m cage x1 3. Posterior segmental instrumentation using cortical pedicle screws at L3 - L5 4. Interbody arthrodesis, L3-4 5. Use of locally harvested bone autograft 6. Use of non-structural bone allograft - ProteiOs, DBM 7. Removal of previous rod  SURGEON: Dr. NConsuella Lose MD  ASSISTANT: Dr. KAshok Pall MD  ANESTHESIA: General Endotracheal  EBL: 250cc  SPECIMENS: None  DRAINS: None  COMPLICATIONS: None immediate  CONDITION: Hemodynamically stable to PACU  HISTORY: Morgan PYPERis a 56y.o. female who has been followed in the outpatient clinic with back and leg pain related to adjacent level stenosis at L3-4 with a history of previous L4-5 decompression and fusion several years ago. Multiple conservative treatments were attempted without significant improvement and we ultimately elected to proceed with surgical decompression and fusion. Risks, benefits, and alternative treatments were reviewed in detail in the office. After all questions were answered, informed consent was obtained and witnessed.  PROCEDURE IN DETAIL: The patient was brought to the operating room via stretcher. After induction of general anesthesia, the patient was positioned on the operative table in the prone position. All pressure points were meticulously padded. Incision was then marked out and prepped and draped in the usual sterile fashion.  After timeout was conducted, the previous skin was infiltrated with local anesthetic. Skin incision was then made sharply, and Bovie electrocautery was used to dissect the subcutaneous tissue until the  lumbodorsal fascia was identified and incised.  I then identified the L3 spinous process.  Dissection was carried slightly laterally to identify the previously placed L4 pedicle screws.  Dissection was then carried out along the previously placed rod to identify the L5 pedicle screw.  Bovie electrocautery was then used to dissect the L3 lamina out laterally until the pars at L3 was identified.  Self-retaining retractor was then placed.  Previously placed set screws at L4 and L5 were then removed.  Previous rod was then removed.    At this point attention was turned to decompression. Complete L3 laminectomy was completed with a high-speed drill and Kerrison punches.  Identified normal dura at the superior aspect underneath the L3 lamina.  Kerrison punches were used to widen the laminectomy.  I was then able to pass a long ball dissector into the foramen at L3-4.  The high-speed drill was then used to cut across the pars interarticularis and remove the inferior articulating process of L3 bilaterally.  I then dissected the dura away from the thickened ligamentum flavum.  I did also note a fair amount of bony overgrowth of the facet as well as the previous L4 lamina which appeared to be causing some compression.  This was thinned out with the drill and removed with Kerrison punches.  The medial portion of the L4 superior articulating process as well as the superior portion of this SAP was also removed with Kerrison punches.  This allowed complete decompression of the foramen.  Also allowed access to the L3-4 disc space.  I then attempted to dissect the ventral epidural space on the patient's right side however there did appear to be a significant amount of fibrosis.  I was able to dissect the ventral epidural space on the patient's  left side at the L3-4 disc space level.  I therefore incised the disc on the left side.  Using a combination of osteotomes, shavers, and curettes, complete discectomy was completed.  The  endplates were removed of any remaining cartilaginous endplate with curettes.  Bone harvested during the decompression was then cleaned of any soft tissue and mixed with DBM and ProteiOs and packed back into the interspace.  A 9 mm expandable lung cage was then tapped into place and expanded to achieve good endplate apposition.  Position was confirmed with lateral fluoroscopy.  At this point utilizing a combination of standard anatomic landmarks and lateral fluoroscopy, entry points for bilateral L3 cortical pedicle screws were identified and drilled and tapped to a depth of 35 mm.  5.5 x 35 mm screws were then placed in L3 bilaterally.  A 60 mm rod, with pre-bent lordosis was placed into the L3, L4, and L5 pedicle screws on the left, while a similar 55 mm rod was placed on the right side.  Setscrews were again placed and final tightened.  Final AP and lateral fluoroscopic images were taken to confirm good location of the implanted hardware.  Hemostasis was secured and confirmed with bipolar cautery and morcellized gelfoam with thrombin. The wound was then irrigated with copious amounts of antibiotic saline, then closed in standard fashion using a combination of interrupted 0 and 3-0 Vicryl stitches in the muscular, fascial, and subcutaneous layers. Skin was then closed using standard Dermabond. Sterile dressing was then applied. The patient was then transferred to the stretcher, extubated, and taken to the postanesthesia care unit in stable hemodynamic condition.  At the end of the case all sponge, needle, cottonoid, and instrument counts were correct.   Consuella Lose, MD [facility]

## 2022-09-09 NOTE — H&P (Signed)
Chief Complaint   Back and leg pain  History of Present Illness  Morgan [name] is a 56 y.o. female who has been followed in the outpatient neurosurgery clinic with a history of previous lumbar decompression and fusion about 3 years ago.  She did well for quite some time, but presented back to the office with recurrence of back and leg pain.  Her imaging did reveal significant adjacent level stenosis including facet arthropathy at L3-4.  She has attempted multiple different conservative treatments including epidural steroid injections.  Unfortunately, she has not had lasting improvement in her symptoms and elected to proceed with surgical decompression and adjacent level fusion.  Past Medical History   Past Medical History:  Diagnosis Date   Allergic rhinitis    Anemia     after gastric bypass in 2018   Anxiety    Barrett's esophagus    Bipolar affective disorder ([place])    CAP (community acquired pneumonia) 07/20/2018   Chronic respiratory failure (HCC)    Constipation, chronic    Degenerative joint disease of spine    Depression    Diabetes mellitus without complication ([employer])    type 2    DM (diabetes mellitus) ([place])    "hypoglycemic" per pt - no longer takes DM meds due to weight loss from gastric bypass in 2018   Dry eye    Family history of adverse reaction to anesthesia    nausea and vomiting   Gastroparesis    GERD (gastroesophageal reflux disease)    H/O shoulder replacement 08/11/2020   left shoulder   History of bariatric surgery 03/2017   History of hiatal hernia    HLD (hyperlipidemia) 03/12/2013   Hyperlipidemia    Hypertension    No HTN meds since weight loss from Gastric Bypass in 2018   Intractable chronic migraine without aura 06/04/2015   Migraine headache    Morbid obesity (HCC)    OSA (obstructive sleep apnea)    No cpap since gastric surgery   Osteoarthritis    bilateral knee   SBO (small bowel obstruction) ([place]) 03/19/2019   Sleep apnea    Unspecified  hypothyroidism    Vitamin B 12 deficiency    Vitamin D deficiency     Past Surgical History   Past Surgical History:  Procedure Laterality Date   ANKLE ARTHROSCOPY WITH RECONSTRUCTION Right 09/24/2019   Procedure: ANKLE ARTHROSCOPY DEBRIDEMENT TREATMENT OF OSTEOCHONDRAL LESION TALUS. PRONEAL TENDON DEBRIDEMENT;  Surgeon: Erle Crocker, MD;  Location: [place];  Service: Orthopedics;  Laterality: Right;  SURGERY REQUEST TIME 2 HOURS   APPENDECTOMY  1978   CHOLECYSTECTOMY  2005   COLONOSCOPY     disc repair  05/17/2019   with rods, lumbar spine   GASTRIC ROUX-EN-Y N/A 03/21/2017   Procedure: LAPAROSCOPIC ROUX-EN-Y GASTRIC, UPPER ENDO;  Surgeon: Greer Pickerel, MD;  Location: [facility];  Service: General;  Laterality: N/A;   GASTROSTOMY N/A 06/19/2018   Procedure: LAPRASCOPIC INSERTION OF GASTROSTOMY TUBE;  Surgeon: Greer Pickerel, MD;  Location: [facility];  Service: General;  Laterality: N/A;   GASTROSTOMY TUBE PLACEMENT Left    06/2018   HIATAL HERNIA REPAIR N/A 06/19/2018   Procedure: LAPAROSCOPIC REPAIR OF HIATAL HERNIA;  Surgeon: Greer Pickerel, MD;  Location: [facility];  Service: General;  Laterality: N/A;   LAPAROSCOPIC LYSIS OF ADHESIONS  03/19/2019   Dr. Romana Juniper   LAPAROSCOPY N/A 06/19/2018   Procedure: LAPAROSCOPY DIAGNOSTIC;  Surgeon: Greer Pickerel, MD;  Location: [facility];  Service: General;  Laterality: N/A;   LAPAROSCOPY N/A 03/19/2019   Procedure: LAPAROSCOPY DIAGNOSTIC lysis of adhesions;  Surgeon: Clovis Riley, MD;  Location: [facility];  Service: General;  Laterality: N/A;   LUMBAR LAMINECTOMY/DECOMPRESSION MICRODISCECTOMY Right 10/11/2018   Procedure: LAMINECTOMY AND FORAMINOTOMY RIGHT LUMBAR FOUR- LUMBAR FIVE;  Surgeon: Consuella Lose, MD;  Location: [place];  Service: Neurosurgery;  Laterality: Right;   MASS EXCISION Right 09/23/2021   Procedure: EXCISION MASS VOLAR ASPECT RIGHT HAND;  Surgeon: Leanora Cover, MD;  Location: [place];   Service: Orthopedics;  Laterality: Right;  45 MIN   SHOULDER ARTHROSCOPY  06/2011   left-dsc   TONSILLECTOMY  at age 76   TOTAL SHOULDER ARTHROPLASTY Left 08/11/2020   Procedure: TOTAL SHOULDER ARTHROPLASTY;  Surgeon: Marchia Bond, MD;  Location: [place];  Service: Orthopedics;  Laterality: Left;   TRIGGER FINGER RELEASE  12/20/2012   Procedure: RELEASE TRIGGER FINGER/A-1 PULLEY;  Surgeon: Tennis Must, MD;  Location: [place];  Service: Orthopedics;  Laterality: Left;  LEFT TRIGGER THUMB RELEASE   TRIGGER FINGER RELEASE Right 09/23/2021   Procedure: RELEASE A-1 PULLEY RIGHT INDEX FINGER;  Surgeon: Leanora Cover, MD;  Location: [place];  Service: Orthopedics;  Laterality: Right;    Social History   Social History   Tobacco Use   Smoking status: Never   Smokeless tobacco: Never  Vaping Use   Vaping Use: Never used  Substance Use Topics   Alcohol use: No   Drug use: No    Medications   Prior to Admission medications   Medication Sig Start Date End Date Taking? Authorizing Provider  Calcium-Vitamin D-Vitamin K 650-12.5-40 MG-MCG-MCG CHEW Chew 1 each by mouth 2 (two) times daily.   Yes [provider]  carboxymethylcellulose (REFRESH PLUS) 0.5 % SOLN Place 1 drop into both eyes 3 (three) times daily as needed (Dry eyes).   Yes [provider]  dicyclomine (BENTYL) 20 MG tablet TAKE 1 TABLET EVERY 8 HOURS AS NEEDED FOR ABDOMINAL CRAMPING 08/12/22  Yes Ronnie Doss M, DO  divalproex (DEPAKOTE) 500 MG DR tablet Take 500 mg by mouth in the morning and 1000 mg at bedtime 10/26/21  Yes Lomax, Amy, NP  ezetimibe (ZETIA) 10 MG tablet Take 1 tablet (10 mg total) by mouth daily. 07/26/22  Yes Gottschalk, Ashly M, DO  fluticasone (FLONASE) 50 MCG/ACT nasal spray USE 2 SPRAYS IN EACH NOSTRIL TWICE DAILY AS NEEDED FOR ALLERGY 07/26/22  Yes Ronnie Doss M, DO  gabapentin (NEURONTIN) 300 MG capsule Takes 2 capsules in  morning, one cap at lunch, 2 caps at bedtime 10/26/21  Yes Lomax, Amy, NP  Galcanezumab-gnlm (EMGALITY) 120 MG/ML SOAJ Inject 120 mg into the skin every 30 (thirty) days. 10/26/21  Yes Lomax, Amy, NP  Glucagon, rDNA, (GLUCAGON EMERGENCY IJ) Inject 1 Syringe as directed daily as needed (emergency low blood sugar).    Yes [provider]  levothyroxine (SYNTHROID) 112 MCG tablet TAKE ONE TABLET ONCE DAILY BEFORE BREAKFAST 05/02/22  Yes Gottschalk, Ashly M, DO  LINZESS 145 MCG CAPS capsule TAKE (1) CAPSULE DAILY BEFORE BREAKFAST. 07/26/22  Yes Gottschalk, Leatrice Jewels M, DO  loratadine (CLARITIN) 10 MG tablet Take 10 mg by mouth daily as needed for allergies.    Yes [provider]  mirabegron ER (MYRBETRIQ) 25 MG TB24 tablet Take 1 tablet (25 mg total) by mouth daily. 07/26/22  Yes Gottschalk, Leatrice Jewels M, DO  Multiple Vitamins-Minerals (BARIATRIC MULTIVITAMINS/IRON PO) Take 1  tablet by mouth daily.   Yes [provider]  nystatin powder APPLY TO AFFECTED AREA OF GROIN RASH TWICE A DAY FOR 7 TO 10 DAYS PER FLARE 04/20/22  Yes Gottschalk, Ashly M, DO  pantoprazole (PROTONIX) 40 MG tablet Take 1 tablet (40 mg total) by mouth daily. 07/26/22  Yes Gottschalk, Leatrice Jewels M, DO  polyethylene glycol (MIRALAX / GLYCOLAX) packet Take 17 g by mouth at bedtime.   Yes [provider]  Probiotic Product (PROBIOTIC DAILY PO) Take 1 capsule by mouth daily.   Yes [provider]  rizatriptan (MAXALT-MLT) 10 MG disintegrating tablet Take 1 tablet (10 mg total) by mouth as needed (take 1 tablet as needed for migraine. may repeat in 2 hours if needed). 11/30/21  Yes Lomax, Amy, NP  sertraline (ZOLOFT) 100 MG tablet TAKE 2 TABLETS DAILY AS DIRECTED 07/26/22  Yes Gottschalk, Ashly M, DO  Simethicone (GAS-X EXTRA STRENGTH) 125 MG CAPS Take 125 mg by mouth in the morning, at noon, and at bedtime.   Yes [provider]  tiZANidine (ZANAFLEX) 2 MG tablet TAKE 1 TABLET EVERY 8 HOURS AS NEEDED  06/27/21  Yes Melvenia Beam, MD  ziprasidone (GEODON) 60 MG capsule Take 1 capsule (60 mg total) by mouth at bedtime. 07/26/22  Yes Ronnie Doss M, DO  glucose blood (ONETOUCH ULTRA) test strip Check BS four times a day Dx E11.9 09/13/21   Ronnie Doss M, DO  Insulin Syringe-Needle U-100 (INSULIN SYRINGE 1CC/31GX5/16") 31G X 5/16" 1 ML MISC Use 3 times daily with octreotide 02/24/22   [provider]    Allergies   Allergies  Allergen Reactions   Ketoconazole Hives and Swelling    SWELLING REACTION UNSPECIFIED    Pravachol [Pravastatin Sodium] Shortness Of Breath, Swelling and Anaphylaxis    Throat swelling   Statins Other (See Comments)    Leg cramps   Tape Dermatitis and Other (See Comments)    Steri-Strips   Codeine Hypertension    increased BP    Review of Systems  ROS  Neurologic Exam  Awake, alert, oriented Memory and concentration grossly intact Speech fluent, appropriate CN grossly intact Motor exam: Upper Extremities Deltoid Bicep Tricep Grip  Right 5/5 5/5 5/5 5/5  Left 5/5 5/5 5/5 5/5   Lower Extremities IP Quad PF DF EHL  Right 5/5 5/5 5/5 5/5 5/5  Left 5/5 5/5 5/5 5/5 5/5   Sensation grossly intact to LT  Imaging  MRI of the lumbar spine dated 06/15/2022 was personally reviewed.  This demonstrates maintenance of lumbar lordosis.  There is susceptibility artifact at L4-5 without obvious residual stenosis.  Primary finding is at L3-4 where there is disc degeneration and loss of height.  There is broad-based disc bulge with ligamentous hypertrophy and bilateral facet arthropathy which contribute to moderately severe central and lateral recess stenosis.  Impression  - 56 y.o. female with back and leg pain related to adjacent level stenosis at L3-4 years after previous L4-5 fusion.  Unfortunately she has failed reasonable conservative treatment  Plan  -We will plan on proceeding with decompression and fusion at L3-4  I have reviewed the  indications for the surgical procedure as well as the details of the procedure and the expected postoperative course and recovery. We have also reviewed in detail the risks, benefits, and alternatives to the procedure. All questions were answered and TIERNEY BEHL provided informed consent to proceed.   Consuella Lose, MD [facility]

## 2022-09-10 DIAGNOSIS — Z79899 Other long term (current) drug therapy: Secondary | ICD-10-CM | POA: Diagnosis not present

## 2022-09-10 DIAGNOSIS — Z96612 Presence of left artificial shoulder joint: Secondary | ICD-10-CM | POA: Diagnosis not present

## 2022-09-10 DIAGNOSIS — M48061 Spinal stenosis, lumbar region without neurogenic claudication: Secondary | ICD-10-CM | POA: Diagnosis not present

## 2022-09-10 DIAGNOSIS — M4726 Other spondylosis with radiculopathy, lumbar region: Secondary | ICD-10-CM | POA: Diagnosis not present

## 2022-09-10 DIAGNOSIS — Z794 Long term (current) use of insulin: Secondary | ICD-10-CM | POA: Diagnosis not present

## 2022-09-10 DIAGNOSIS — E119 Type 2 diabetes mellitus without complications: Secondary | ICD-10-CM | POA: Diagnosis not present

## 2022-09-10 DIAGNOSIS — I10 Essential (primary) hypertension: Secondary | ICD-10-CM | POA: Diagnosis not present

## 2022-09-10 DIAGNOSIS — E039 Hypothyroidism, unspecified: Secondary | ICD-10-CM | POA: Diagnosis not present

## 2022-09-10 LAB — GLUCOSE, CAPILLARY: Glucose-Capillary: 75 mg/dL (ref 70–99)

## 2022-09-10 MED ORDER — METHOCARBAMOL 500 MG PO TABS: 500.0000 mg | ORAL_TABLET | Freq: Four times a day (QID) | ORAL | 2 refills | Status: DC | PRN

## 2022-09-10 MED ORDER — OXYCODONE HCL 10 MG PO TABS: 10.0000 mg | ORAL_TABLET | Freq: Four times a day (QID) | ORAL | 0 refills | Status: AC | PRN

## 2022-09-10 NOTE — Evaluation (Signed)
Occupational Therapy Evaluation Patient Details Name: Morgan [name] MRN: [id number] DOB: [birth year] Today's Date: 09/10/2022   History of Present Illness 56 yo F s/p L3-4 PLIF.  PMH includes CAP, DM, HTN, OSA.   Clinical Impression   Patient admitted for the procedure above.  PTA she lives at home with her great nephew, who is available to proved any needed assist.  The patient continues to drive, and needed no assist for ADL/iADL, and did not use an AD for mobility.  Patient had a prior PLIF, and remembers all her precautions and log rolling.  Patient is essentially at her baseline, needing no assist to mobilize or perform ADL from a sit to stand level.  Primary deficit is post op discomfort.  No further needs in the acute setting, and recommend follow up with MD as prescribed.         Recommendations for follow up therapy are one component of a multi-disciplinary discharge planning process, led by the attending physician.  Recommendations may be updated based on patient status, additional functional criteria and insurance authorization.   Follow Up Recommendations  No OT follow up    Assistance Recommended at Discharge Set up Supervision/Assistance  Patient can return home with the following Assist for transportation;Assistance with cooking/housework    Functional Status Assessment  Patient has not had a recent decline in their functional status  Equipment Recommendations  None recommended by OT    Recommendations for Other Services       Precautions / Restrictions Precautions Precautions: Back Precaution Booklet Issued: Yes (comment) Required Braces or Orthoses: Spinal Brace Spinal Brace: Lumbar corset Restrictions Weight Bearing Restrictions: No      Mobility Bed Mobility Overal bed mobility: Modified Independent                  Transfers Overall transfer level: Independent                        Balance Overall balance assessment: Mild  deficits observed, not formally tested                                         ADL either performed or assessed with clinical judgement   ADL Overall ADL's : At baseline                                             Vision Patient Visual Report: No change from baseline       Perception Perception Perception: Within Functional Limits   Praxis Praxis Praxis: Intact    Pertinent Vitals/Pain Pain Assessment Pain Assessment: Faces Faces Pain Scale: Hurts a little bit Pain Location: Incision Pain Descriptors / Indicators: Tender Pain Intervention(s): Monitored during session     Hand Dominance Right   Extremity/Trunk Assessment Upper Extremity Assessment Upper Extremity Assessment: Overall WFL for tasks assessed   Lower Extremity Assessment Lower Extremity Assessment: Overall WFL for tasks assessed   Cervical / Trunk Assessment Cervical / Trunk Assessment: Back Surgery   Communication Communication Communication: No difficulties   Cognition Arousal/Alertness: Awake/alert Behavior During Therapy: WFL for tasks assessed/performed Overall Cognitive Status: Within Functional Limits for tasks assessed  General Comments   VSS on RA    Exercises     Shoulder Instructions      Home Living Family/patient expects to be discharged to:: Private residence Living Arrangements: Other relatives Available Help at Discharge: Family Type of Home: House Home Access: Ramped entrance     Home Layout: One level     Bathroom Shower/Tub: Tub/shower unit;Walk-in shower   Bathroom Toilet: Standard Bathroom Accessibility: Yes   Home Equipment: Conservation officer, nature (2 wheels);Toilet riser;BSC/3in1;Shower seat          Prior Functioning/Environment Prior Level of Function : Independent/Modified Independent;Driving                        OT Problem List: Pain      OT  Treatment/Interventions:      OT Goals(Current goals can be found in the care plan section) Acute Rehab OT Goals Patient Stated Goal: Return home OT Goal Formulation: With patient Time For Goal Achievement: 09/12/22 Potential to Achieve Goals: Good  OT Frequency:      Co-evaluation              AM-PAC OT "6 Clicks" Daily Activity     Outcome Measure Help from another person eating meals?: None Help from another person taking care of personal grooming?: None Help from another person toileting, which includes using toliet, bedpan, or urinal?: None Help from another person bathing (including washing, rinsing, drying)?: None Help from another person to put on and taking off regular upper body clothing?: None Help from another person to put on and taking off regular lower body clothing?: None 6 Click Score: 24   End of Session Nurse Communication: Mobility status  Activity Tolerance: Patient tolerated treatment well Patient left: in chair;with call bell/phone within reach  OT Visit Diagnosis: Unsteadiness on feet (R26.81)                Time: 2876-8115 OT Time Calculation (min): 21 min Charges:  OT General Charges $OT Visit: 1 Visit OT Evaluation $OT Eval Moderate Complexity: 1 Mod  09/10/2022  RP, OTR/L  Acute Rehabilitation Services  Office:  [phone]   Metta Clines 09/10/2022, 8:55 AM

## 2022-09-10 NOTE — Discharge Summary (Signed)
Discharge Summary  Date of Admission: 09/09/2022  Date of Discharge: 09/10/22  Attending Physician: Emelda Brothers, MD  Hospital Course: Patient was admitted following an uncomplicated B5-3 laminectomy / extension of fusion for ASD. They were recovered in PACU and transferred to [facility]. Their preop symptoms were completely resolved, their hospital course was uncomplicated and the patient was discharged home on 09/10/22. They will follow up in clinic with me in clinic in 2 weeks.  Neurologic exam at discharge:  Strength 5/5 x4 and SILTx4   Discharge diagnosis: Lumbar radiculopathy  Judith Part, MD 09/10/22 8:19 AM

## 2022-09-10 NOTE — Plan of Care (Signed)
  Problem: Education: Goal: Ability to describe self-care measures that may prevent or decrease complications (Diabetes Survival Skills Education) will improve Outcome: Completed/Met Goal: Individualized Educational Video(s) Outcome: Completed/Met   Problem: Coping: Goal: Ability to adjust to condition or change in health will improve Outcome: Completed/Met   Problem: Fluid Volume: Goal: Ability to maintain a balanced intake and output will improve Outcome: Completed/Met   Problem: Health Behavior/Discharge Planning: Goal: Ability to identify and utilize available resources and services will improve Outcome: Completed/Met Goal: Ability to manage health-related needs will improve Outcome: Completed/Met   Problem: Metabolic: Goal: Ability to maintain appropriate glucose levels will improve Outcome: Completed/Met   Problem: Nutritional: Goal: Maintenance of adequate nutrition will improve Outcome: Completed/Met Goal: Progress toward achieving an optimal weight will improve Outcome: Completed/Met   Problem: Skin Integrity: Goal: Risk for impaired skin integrity will decrease Outcome: Completed/Met   Problem: Tissue Perfusion: Goal: Adequacy of tissue perfusion will improve Outcome: Completed/Met   Problem: Education: Goal: Ability to verbalize activity precautions or restrictions will improve Outcome: Completed/Met Goal: Knowledge of the prescribed therapeutic regimen will improve Outcome: Completed/Met Goal: Understanding of discharge needs will improve Outcome: Completed/Met   Problem: Activity: Goal: Ability to avoid complications of mobility impairment will improve Outcome: Completed/Met Goal: Ability to tolerate increased activity will improve Outcome: Completed/Met Goal: Will remain free from falls Outcome: Completed/Met   Problem: Bowel/Gastric: Goal: Gastrointestinal status for postoperative course will improve Outcome: Completed/Met   Problem: Clinical  Measurements: Goal: Ability to maintain clinical measurements within normal limits will improve Outcome: Completed/Met Goal: Postoperative complications will be avoided or minimized Outcome: Completed/Met Goal: Diagnostic test results will improve Outcome: Completed/Met   Problem: Pain Management: Goal: Pain level will decrease Outcome: Completed/Met   Problem: Skin Integrity: Goal: Will show signs of wound healing Outcome: Completed/Met   Problem: Health Behavior/Discharge Planning: Goal: Identification of resources available to assist in meeting health care needs will improve Outcome: Completed/Met   Problem: Bladder/Genitourinary: Goal: Urinary functional status for postoperative course will improve Outcome: Completed/Met

## 2022-09-10 NOTE — Progress Notes (Signed)
PT Cancellation/Sign Off Note  Patient Details Name: Morgan [name] MRN: [id number] DOB: 09/13/1966   Cancelled Treatment:    Reason Eval/Treat Not Completed: PT screened, no needs identified, will sign off Patient up walking without device when PT arrived. She had worked with OT prior to my arrival. Able to state 3/3 back precautions without cues. She reports no difficulty with mobility and states she remembers what to do from her previous back surgery. I have encouraged the patient to gradually increase activity daily to tolerance.   I have answered all patient's question regarding PT and mobility.  Pt declines PT evaluation at this time and feels ready for DC home today.    Melvern Banker 09/10/2022, 9:16 AM  Lavonia Dana, [place]  Office [phone] 09/10/2022

## 2022-09-10 NOTE — Progress Notes (Signed)
Patient is discharged from room 3C04 at this time. Alert and in stable condition. IV site d/c'd and instructions read to patient with understanding verbalized and all questions answered. Left unit via wheelchair with all belongings at side. 

## 2022-09-10 NOTE — Progress Notes (Signed)
Neurosurgery Service Progress Note  Subjective: No acute events overnight, preop leg symptoms resolved   Objective: Vitals:   09/09/22 1731 09/09/22 1938 09/09/22 2312 09/10/22 0704  BP: 133/81 97/64 111/66 103/68  Pulse: 88 84 74 71  Resp: '16 18 18 18  '$ Temp: 98.4 F (36.9 C) 98.7 F (37.1 C) 98.2 F (36.8 C) 98.7 F (37.1 C)  TempSrc: Oral Oral Oral Oral  SpO2: 99% 91% 96% 96%  Weight:      Height:        Physical Exam: Strength 5/5 x4 and SILTx4, incision c/d/I w/ dressing  Assessment & Plan: 56 y.o. woman s/p L3-4 extension / decompression for ASD, recovering well.  -discharge home today  Judith Part  09/10/22 8:18 AM

## 2022-09-13 ENCOUNTER — Other Ambulatory Visit: Payer: Self-pay | Admitting: Family Medicine

## 2022-09-13 DIAGNOSIS — E11649 Type 2 diabetes mellitus with hypoglycemia without coma: Secondary | ICD-10-CM

## 2022-09-13 DIAGNOSIS — E162 Hypoglycemia, unspecified: Secondary | ICD-10-CM

## 2022-09-22 ENCOUNTER — Encounter: Payer: Self-pay | Admitting: Family

## 2022-09-22 ENCOUNTER — Ambulatory Visit (INDEPENDENT_AMBULATORY_CARE_PROVIDER_SITE_OTHER): Payer: Medicare Other | Admitting: Family

## 2022-09-22 DIAGNOSIS — J019 Acute sinusitis, unspecified: Secondary | ICD-10-CM | POA: Diagnosis not present

## 2022-09-22 MED ORDER — AMOXICILLIN-POT CLAVULANATE 875-125 MG PO TABS: 1.0000 | ORAL_TABLET | Freq: Two times a day (BID) | ORAL | 0 refills | Status: DC

## 2022-09-22 NOTE — Progress Notes (Signed)
Virtual Visit  Note Due to COVID-19 pandemic this visit was conducted virtually. This visit type was conducted due to national recommendations for restrictions regarding the COVID-19 Pandemic (e.g. social distancing, sheltering in place) in an effort to limit this patient's exposure and mitigate transmission in our community. All issues noted in this document were discussed and addressed.  A physical exam was not performed with this format.  I connected with Morgan [name] on 09/22/22 at 12:41 pm  by telephone and verified that I am speaking with the correct person using two identifiers. Morgan [name] is currently located at home and no one is currently with her during visit. The provider, Evelina Dun, FNP is located in their office at time of visit.  I discussed the limitations, risks, security and privacy concerns of performing an evaluation and management service by telephone and the availability of in person appointments. I also discussed with the patient that there may be a patient responsible charge related to this service. The patient expressed understanding and agreed to proceed.  Morgan [name] are scheduled for a virtual visit with your provider today.    Just as we do with appointments in the office, we must obtain your consent to participate.  Your consent will be active for this visit and any virtual visit you may have with one of our providers in the next 365 days.    If you have a MyChart account, I can also send a copy of this consent to you electronically.  All virtual visits are billed to your insurance company just like a traditional visit in the office.  As this is a virtual visit, video technology does not allow for your provider to perform a traditional examination.  This may limit your provider's ability to fully assess your condition.  If your provider identifies any concerns that need to be evaluated in person or the need to arrange testing such as labs, EKG, etc, we  will make arrangements to do so.    Although advances in technology are sophisticated, we cannot ensure that it will always work on either your end or our end.  If the connection with a video visit is poor, we may have to switch to a telephone visit.  With either a video or telephone visit, we are not always able to ensure that we have a secure connection.   I need to obtain your verbal consent now.   Are you willing to proceed with your visit today?   [name] has provided verbal consent on 09/22/2022 for a virtual visit (video or telephone).   Evelina Dun, [place] 09/22/2022  12:36 PM   History and Present Illness:  Cough This is a new problem. The current episode started 1 to 4 weeks ago. The problem has been gradually worsening. The problem occurs every few minutes. The cough is Productive of sputum. Associated symptoms include a fever (low grade), nasal congestion and postnasal drip. Pertinent negatives include no chills, ear congestion, ear pain, headaches, myalgias, sore throat, shortness of breath or wheezing. Associated symptoms comments: Sinus pressure. She has tried rest and OTC cough suppressant for the symptoms.  Sinus Problem This is a new problem. The current episode started 1 to 4 weeks ago. The problem has been gradually worsening since onset. There has been no fever. Her pain is at a severity of 5/10. The pain is moderate. Associated symptoms include coughing, sinus pressure and sneezing. Pertinent negatives include no chills, ear pain, headaches, shortness of  breath or sore throat. Past treatments include oral decongestants and acetaminophen. The treatment provided mild relief.      Review of Systems  Constitutional:  Positive for fever (low grade). Negative for chills.  HENT:  Positive for postnasal drip, sinus pressure and sneezing. Negative for ear pain and sore throat.   Respiratory:  Positive for cough. Negative for shortness of breath and wheezing.    Musculoskeletal:  Negative for myalgias.  Neurological:  Negative for headaches.     Observations/Objective: No SOB or distress noted   Assessment and Plan: 1. Acute sinusitis, recurrence not specified, unspecified location - Take meds as prescribed - Use a cool mist humidifier  -Use saline nose sprays frequently -Force fluids -For any cough or congestion  Use plain Mucinex- regular strength or max strength is fine -For fever or aces or pains- take tylenol or ibuprofen. -Throat lozenges if help -Follow up if symptoms worsen or do not improve  - amoxicillin-clavulanate (AUGMENTIN) 875-125 MG tablet; Take 1 tablet by mouth 2 (two) times daily.  Dispense: 14 tablet; Refill: 0      I discussed the assessment and treatment plan with the patient. The patient was provided an opportunity to ask questions and all were answered. The patient agreed with the plan and demonstrated an understanding of the instructions.   The patient was advised to call back or seek an in-person evaluation if the symptoms worsen or if the condition fails to improve as anticipated.  The above assessment and management plan was discussed with the patient. The patient verbalized understanding of and has agreed to the management plan. Patient is aware to call the clinic if symptoms persist or worsen. Patient is aware when to return to the clinic for a follow-up visit. Patient educated on when it is appropriate to go to the emergency department.   Time call ended:  12:53 pm   I provided 12 minutes of  non face-to-face time during this encounter.    Evelina Dun, FNP

## 2022-09-22 NOTE — Patient Instructions (Signed)

## 2022-10-03 DIAGNOSIS — M5416 Radiculopathy, lumbar region: Secondary | ICD-10-CM | POA: Diagnosis not present

## 2022-10-04 ENCOUNTER — Other Ambulatory Visit: Payer: Self-pay | Admitting: Family Medicine

## 2022-10-31 NOTE — Patient Instructions (Signed)
Below is our plan:  We will continue current treatment plan.  Please make sure you are staying well hydrated. I recommend 50-60 ounces daily. Well balanced diet and regular exercise encouraged. Consistent sleep schedule with 6-8 hours recommended.   Please continue follow up with care team as directed.   Follow up with me in 1 year   You may receive a survey regarding today's visit. I encourage you to leave honest feed back as I do use this information to improve patient care. Thank you for seeing me today!    

## 2022-10-31 NOTE — Progress Notes (Unsigned)
PATIENT: Morgan [name] DOB: 03/20/1966  REASON FOR VISIT: follow up HISTORY FROM: patient  Virtual Visit via Telephone Note  I connected with Morgan [name] on 11/01/22 at  8:15 AM EST by telephone and verified that I am speaking with the correct person using two identifiers.   I discussed the limitations, risks, security and privacy concerns of performing an evaluation and management service by telephone and the availability of in person appointments. I also discussed with the patient that there may be a patient responsible charge related to this service. The patient expressed understanding and agreed to proceed.   History of Present Illness:  11/01/22 ALL:  Morgan [name] returns for follow up for migraines. She continues divalproex 500/1000, gabapentin 600/300/600, Emgality and rizatriptan. She reports having an increase in headaches after back surgery in 08/2022. She contributes this to pain medications. She may have 1-5 migraines per month. Rizatriptan works well. She is feeling well and without concerns.   10/26/2021 ALL ([place]): Morgan [name] returns for migraine follow up. She continues divalproex, gabapentin and Emgality. Rizatriptan used for abortive therapy. She reports headaches are fairly well managed. She has had a few more headaches over the past couple of months. She has 3-5 migraines per month. Rizatriptan works well for abortive therapy. She reports that CBGs have been fluctuating. She is scheduling follow up with PCP and endo to discuss.   10/20/2020 ALL:  Morgan [name] is a 56 y.o. female here today for follow up for migraines. We continues divalproex, gabapentin and Emgality. She was started on rizatriptan for abortive therapy at last follow up. She reports that she is doing very well. She has 2-3 headache days per month on average. Rizatriptan has worked very well for abortive therapy. She is tolerating medications well with no obvious adverse effects. She had left shoulder  replacement in 07/2020 and is recovering well.   History (copied from my note on 04/14/2020)  Morgan [name] is a 56 y.o. female here today for follow up of migraines.  She continues divalproex and gabapentin as prescribed.  We switched her to [facility] in February she reports that headaches have decreased significantly in intensity and frequency.  She had 4 headaches in the month of April.  She continues to follow with orthopedics closely for neck pain.  She has been dissipated in physical therapy and received 1 steroid injection.  She does not feel that either were very beneficial.  She has tried dry needling with no success.   History (copied from my note on 01/14/2020)   Morgan [name] is a 56 y.o. female here today for follow up.She continues divalproex '500mg'$  and '1000mg'$  at night, gabapentin '600mg'$  in the am, '300mg'$  at lunch and '600mg'$  at bedtime and Ajovy monthly. She does feel that headaches are less severe but continue daily. She feels that pounding headaches are much less severe. She is tolerating medications well, however, insurance is requiring that she try Emgality. They will no longer pay for Ajovy. Insurance will cover [employer]. She is willing to switch medications. She continues to have chronic neck pain managed by orthopedics at this time. She is current treating with steroid injections.    History (copied from Dr Cathren Laine note on 05/22/2019)   Interval history: 24 headache days a month. Most are migraines. 15 are severe. Ajovy may be helping a little, she has had so much going on she doesn't know if that is contributing. She had emergency surgery in April and recent back surgery. She  is taking oxycontin and '325mg'$  tylenol. She will keep on the Ajovy and when all the issues are calmed down she will call us and discuss nerve blocks.    Observations/Objective:  Generalized: Well developed, in no acute distress  Mentation: Alert oriented to time, place, history taking. Follows all commands  speech and language fluent   Assessment and Plan:  56 y.o. year old female  has a past medical history of Allergic rhinitis, Anemia, Anxiety, Barrett's esophagus, Bipolar affective disorder ([place]), CAP (community acquired pneumonia) (07/20/2018), Chronic respiratory failure ([place]), Constipation, chronic, Degenerative joint disease of spine, Depression, Diabetes mellitus without complication ([place]), DM (diabetes mellitus) ([place]), Dry eye, Family history of adverse reaction to anesthesia, Gastroparesis, GERD (gastroesophageal reflux disease), H/O shoulder replacement (08/11/2020), History of bariatric surgery (03/2017), History of hiatal hernia, HLD (hyperlipidemia) (03/12/2013), Hyperlipidemia, Hypertension, Intractable chronic migraine without aura (06/04/2015), Migraine headache, Morbid obesity ([place]), OSA (obstructive sleep apnea), Osteoarthritis, SBO (small bowel obstruction) ([place]) (03/19/2019), Sleep apnea, Unspecified hypothyroidism, Vitamin B 12 deficiency, and Vitamin D deficiency. here with    ICD-10-CM   1. Migraine without aura and without status migrainosus, not intractable  G43.009       Morgan [name] is doing very well, today.  Migraines are well managed on current treatment plan.  We will continue divalproex 500/100, gabapentin 600/300/'600mg'$  and Emgality as prescribed.  She will continue rizatriptan as needed for abortive therapy.  She will continue to focus on healthy lifestyle habits. Follow-up with care team as advised.  She will return to see me in 1 year for migraine follow-up.  She is aware she may call sooner if needed.  She verbalizes understanding and agreement with this plan.  No orders of the defined types were placed in this encounter.    Meds ordered this encounter  Medications   rizatriptan (MAXALT-MLT) 10 MG disintegrating tablet    Sig: Take 1 tablet (10 mg total) by mouth as needed (take 1 tablet as needed for migraine. may repeat in 2 hours if needed).    Dispense:  10 tablet     Refill:  2    Order Specific Question:   Supervising Provider    Answer:   Melvenia Beam [[id number]]   gabapentin (NEURONTIN) 300 MG capsule    Sig: Takes 2 capsules in morning, one cap at lunch, 2 caps at bedtime    Dispense:  450 capsule    Refill:  3    Order Specific Question:   Supervising Provider    Answer:   Melvenia Beam [[id number]]   divalproex (DEPAKOTE) 500 MG DR tablet    Sig: Take 500 mg by mouth in the morning and 1000 mg at bedtime    Dispense:  270 tablet    Refill:  3    Order Specific Question:   Supervising Provider    Answer:   Melvenia Beam [[id number]]   Galcanezumab-gnlm (EMGALITY) 120 MG/ML SOAJ    Sig: Inject 120 mg into the skin every 30 (thirty) days.    Dispense:  3 mL    Refill:  3    Order Specific Question:   Supervising Provider    Answer:   Melvenia Beam [id number]      Follow Up Instructions:  I discussed the assessment and treatment plan with the patient. The patient was provided an opportunity to ask questions and all were answered. The patient agreed with the plan and demonstrated an understanding of the instructions.   The patient was advised to  call back or seek an in-person evaluation if the symptoms worsen or if the condition fails to improve as anticipated.  I provided 15 minutes of non-face-to-face time during this encounter. Patient is located at her place of residence, provider is in the office.    Debbora Presto, NP

## 2022-11-01 ENCOUNTER — Telehealth (INDEPENDENT_AMBULATORY_CARE_PROVIDER_SITE_OTHER): Payer: Medicare Other | Admitting: Family Medicine

## 2022-11-01 ENCOUNTER — Encounter: Payer: Self-pay | Admitting: Family Medicine

## 2022-11-01 DIAGNOSIS — G43009 Migraine without aura, not intractable, without status migrainosus: Secondary | ICD-10-CM | POA: Diagnosis not present

## 2022-11-01 MED ORDER — GABAPENTIN 300 MG PO CAPS: ORAL_CAPSULE | ORAL | 3 refills | Status: DC

## 2022-11-01 MED ORDER — RIZATRIPTAN BENZOATE 10 MG PO TBDP: 10.0000 mg | ORAL_TABLET | ORAL | 2 refills | Status: DC | PRN

## 2022-11-01 MED ORDER — DIVALPROEX SODIUM 500 MG PO DR TAB: DELAYED_RELEASE_TABLET | ORAL | 3 refills | Status: DC

## 2022-11-01 MED ORDER — EMGALITY 120 MG/ML ~~LOC~~ SOAJ: 120.0000 mg | SUBCUTANEOUS | 3 refills | Status: DC

## 2022-11-10 DIAGNOSIS — E162 Hypoglycemia, unspecified: Secondary | ICD-10-CM | POA: Diagnosis not present

## 2022-11-18 DIAGNOSIS — M170 Bilateral primary osteoarthritis of knee: Secondary | ICD-10-CM | POA: Diagnosis not present

## 2022-12-23 DIAGNOSIS — Z1231 Encounter for screening mammogram for malignant neoplasm of breast: Secondary | ICD-10-CM | POA: Diagnosis not present

## 2022-12-28 DIAGNOSIS — M542 Cervicalgia: Secondary | ICD-10-CM | POA: Diagnosis not present

## 2022-12-30 ENCOUNTER — Encounter: Payer: Self-pay | Admitting: Family Medicine

## 2023-01-02 DIAGNOSIS — M542 Cervicalgia: Secondary | ICD-10-CM | POA: Diagnosis not present

## 2023-01-05 DIAGNOSIS — M542 Cervicalgia: Secondary | ICD-10-CM | POA: Diagnosis not present

## 2023-01-23 ENCOUNTER — Other Ambulatory Visit: Payer: Self-pay | Admitting: Family Medicine

## 2023-01-23 DIAGNOSIS — E039 Hypothyroidism, unspecified: Secondary | ICD-10-CM

## 2023-01-24 ENCOUNTER — Other Ambulatory Visit: Payer: Self-pay | Admitting: Family Medicine

## 2023-01-24 DIAGNOSIS — E039 Hypothyroidism, unspecified: Secondary | ICD-10-CM

## 2023-01-27 ENCOUNTER — Ambulatory Visit (INDEPENDENT_AMBULATORY_CARE_PROVIDER_SITE_OTHER): Payer: Medicare Other | Admitting: Family Medicine

## 2023-01-27 ENCOUNTER — Encounter: Payer: Self-pay | Admitting: Family Medicine

## 2023-01-27 VITALS — BP 124/79 | HR 90 | Temp 98.7°F | Ht 64.0 in | Wt 166.0 lb

## 2023-01-27 DIAGNOSIS — E1159 Type 2 diabetes mellitus with other circulatory complications: Secondary | ICD-10-CM | POA: Diagnosis not present

## 2023-01-27 DIAGNOSIS — M5412 Radiculopathy, cervical region: Secondary | ICD-10-CM | POA: Diagnosis not present

## 2023-01-27 DIAGNOSIS — E119 Type 2 diabetes mellitus without complications: Secondary | ICD-10-CM | POA: Diagnosis not present

## 2023-01-27 DIAGNOSIS — T466X5A Adverse effect of antihyperlipidemic and antiarteriosclerotic drugs, initial encounter: Secondary | ICD-10-CM | POA: Diagnosis not present

## 2023-01-27 DIAGNOSIS — E785 Hyperlipidemia, unspecified: Secondary | ICD-10-CM | POA: Diagnosis not present

## 2023-01-27 DIAGNOSIS — M609 Myositis, unspecified: Secondary | ICD-10-CM | POA: Diagnosis not present

## 2023-01-27 DIAGNOSIS — E559 Vitamin D deficiency, unspecified: Secondary | ICD-10-CM

## 2023-01-27 DIAGNOSIS — E039 Hypothyroidism, unspecified: Secondary | ICD-10-CM

## 2023-01-27 DIAGNOSIS — E1169 Type 2 diabetes mellitus with other specified complication: Secondary | ICD-10-CM

## 2023-01-27 DIAGNOSIS — I152 Hypertension secondary to endocrine disorders: Secondary | ICD-10-CM

## 2023-01-27 DIAGNOSIS — E538 Deficiency of other specified B group vitamins: Secondary | ICD-10-CM

## 2023-01-27 DIAGNOSIS — Z9884 Bariatric surgery status: Secondary | ICD-10-CM | POA: Diagnosis not present

## 2023-01-27 LAB — BAYER DCA HB A1C WAIVED: HB A1C (BAYER DCA - WAIVED): 6.1 % — ABNORMAL HIGH (ref 4.8–5.6)

## 2023-01-27 NOTE — Progress Notes (Signed)
Subjective: CC:DM PCP: Janora Norlander, DO EB:[id number] R Allery is a 57 y.o. female presenting to clinic today for:  1. Type 2 Diabetes (diet controlled) with hypertension, hyperlipidemia, cervical radiculopathy:  Patient has monitor her blood sugars closely and on average they are 80-110's.  She has occasional blood sugars in the 60s but nothing less than this.  She is compliant with Zetia.  Has history of statin intolerance in the past.  Blood pressure is diet controlled as well now given history of gastric bypass.  Last eye exam: Up-to-date with Dr. Marin Comment Last foot exam: Needs Last A1c:  Lab Results  Component Value Date   HGBA1C 5.4 09/09/2022   Nephropathy screen indicated?:  Needs Last flu, zoster and/or pneumovax:  Immunization History  Administered Date(s) Administered   Covid-19, Mrna,Vaccine(Spikevax)95yr and older 12/14/2022   Influenza Inj Mdck Quad With Preservative 09/11/2017   Influenza Split 09/11/2020   Influenza,inj,Quad PF,6+ Mos 09/29/2016, 09/07/2017, 09/11/2018, 09/01/2021, 09/05/2022   Influenza,inj,quad, With Preservative 09/17/2019   Influenza-Unspecified 09/11/2016   Moderna Covid-19 Vaccine Bivalent Booster 180yr& up 11/23/2021   Moderna Sars-Covid-2 Vaccination 03/02/2020, 03/30/2020, 10/14/2020   PNEUMOCOCCAL CONJUGATE-20 01/26/2022   Pneumococcal Polysaccharide-23 06/29/2016   Td 07/09/2015   Tdap 07/09/2015   Zoster Recombinat (Shingrix) 10/06/2017, 01/04/2018    ROS: No chest pain, shortness of breath, edema, sensory changes or visual disturbance.  She does report some increasing mid/upper back pain that is causing some numbness and tingling and burning sensation in that right upper extremity.  In fact she has an appointment with her specialist on Wednesday to do a back injection.  Unfortunately she was found to have a bulging disc in her C-spine and there is some encroachment upon the spinal canal.  She may have to go back in for yet another  back surgery  2. Hypothyroidism Compliant with her thyroid replacement.  No reports of tremor, heart palpitations or visual disturbance.   ROS: Per HPI  Allergies  Allergen Reactions   Ketoconazole Hives and Swelling    SWELLING REACTION UNSPECIFIED    Pravachol [Pravastatin Sodium] Shortness Of Breath, Swelling and Anaphylaxis    Throat swelling   Statins Other (See Comments)    Leg cramps   Tape Dermatitis and Other (See Comments)    Steri-Strips   Codeine Hypertension    increased BP   Past Medical History:  Diagnosis Date   Allergic rhinitis    Anemia     after gastric bypass in 2018   Anxiety    Barrett's esophagus    Bipolar affective disorder (HCC)    CAP (community acquired pneumonia) 07/20/2018   Chronic respiratory failure (HCC)    Constipation, chronic    Degenerative joint disease of spine    Depression    Diabetes mellitus without complication (HCHappy   type 2    DM (diabetes mellitus) ([facility]ilo   "hypoglycemic" per pt - no longer takes DM meds due to weight loss from gastric bypass in 2018   Dry eye    Family history of adverse reaction to anesthesia    nausea and vomiting   Gastroparesis    GERD (gastroesophageal reflux disease)    H/O shoulder replacement 08/11/2020   left shoulder   History of bariatric surgery 03/2017   History of hiatal hernia    HLD (hyperlipidemia) 03/12/2013   Hyperlipidemia    Hypertension    No HTN meds since weight loss from Gastric Bypass in 2018   Intractable chronic migraine  without aura 06/04/2015   Migraine headache    Morbid obesity (HCC)    OSA (obstructive sleep apnea)    No cpap since gastric surgery   Osteoarthritis    bilateral knee   SBO (small bowel obstruction) ([place]) 03/19/2019   Sleep apnea    Unspecified hypothyroidism    Vitamin B 12 deficiency    Vitamin D deficiency     Current Outpatient Medications:    Calcium-Vitamin D-Vitamin K 650-12.5-40 MG-MCG-MCG CHEW, Chew 1 each by mouth 2 (two) times daily.,  Disp: , Rfl:    dicyclomine (BENTYL) 20 MG tablet, TAKE 1 TABLET EVERY 8 HOURS AS NEEDED FOR ABDOMINAL CRAMPING, Disp: 30 tablet, Rfl: 2   divalproex (DEPAKOTE) 500 MG DR tablet, Take 500 mg by mouth in the morning and 1000 mg at bedtime, Disp: 270 tablet, Rfl: 3   ezetimibe (ZETIA) 10 MG tablet, Take 1 tablet (10 mg total) by mouth daily., Disp: 90 tablet, Rfl: 3   fluticasone (FLONASE) 50 MCG/ACT nasal spray, USE 2 SPRAYS IN EACH NOSTRIL TWICE DAILY AS NEEDED FOR ALLERGY, Disp: 16 g, Rfl: 4   gabapentin (NEURONTIN) 300 MG capsule, Takes 2 capsules in morning, one cap at lunch, 2 caps at bedtime, Disp: 450 capsule, Rfl: 3   Galcanezumab-gnlm (EMGALITY) 120 MG/ML SOAJ, Inject 120 mg into the skin every 30 (thirty) days., Disp: 3 mL, Rfl: 3   Glucagon, rDNA, (GLUCAGON EMERGENCY IJ), Inject 1 Syringe as directed daily as needed (emergency low blood sugar). , Disp: , Rfl:    glucose blood (ONETOUCH ULTRA) test strip, Check BS four times a day Dx E11.9, Disp: 400 strip, Rfl: 3   levothyroxine (SYNTHROID) 112 MCG tablet, TAKE ONE TABLET ONCE DAILY BEFORE BREAKFAST, Disp: 90 tablet, Rfl: 1   LINZESS 145 MCG CAPS capsule, TAKE (1) CAPSULE DAILY BEFORE BREAKFAST., Disp: 90 capsule, Rfl: 3   loratadine (CLARITIN) 10 MG tablet, Take 10 mg by mouth daily as needed for allergies. , Disp: , Rfl:    methocarbamol (ROBAXIN) 500 MG tablet, Take 1 tablet (500 mg total) by mouth every 6 (six) hours as needed for muscle spasms., Disp: 30 tablet, Rfl: 2   mirabegron ER (MYRBETRIQ) 25 MG TB24 tablet, Take 1 tablet (25 mg total) by mouth daily., Disp: 90 tablet, Rfl: 3   Multiple Vitamins-Minerals (BARIATRIC MULTIVITAMINS/IRON PO), Take 1 tablet by mouth daily., Disp: , Rfl:    nystatin powder, APPLY TO AFFECTED AREA OF GROIN RASH TWICE A DAY FOR 7 TO 10 DAYS PER FLARE, Disp: 30 g, Rfl: 1   pantoprazole (PROTONIX) 40 MG tablet, Take 1 tablet (40 mg total) by mouth daily., Disp: 90 tablet, Rfl: 3   polyethylene glycol  (MIRALAX / GLYCOLAX) packet, Take 17 g by mouth at bedtime., Disp: , Rfl:    Probiotic Product (PROBIOTIC DAILY PO), Take 1 capsule by mouth daily., Disp: , Rfl:    rizatriptan (MAXALT-MLT) 10 MG disintegrating tablet, Take 1 tablet (10 mg total) by mouth as needed (take 1 tablet as needed for migraine. may repeat in 2 hours if needed)., Disp: 10 tablet, Rfl: 2   sertraline (ZOLOFT) 100 MG tablet, TAKE 2 TABLETS DAILY AS DIRECTED, Disp: 180 tablet, Rfl: 3   Simethicone (GAS-X EXTRA STRENGTH) 125 MG CAPS, Take 125 mg by mouth in the morning, at noon, and at bedtime., Disp: , Rfl:    tiZANidine (ZANAFLEX) 2 MG tablet, TAKE 1 TABLET EVERY 8 HOURS AS NEEDED, Disp: 30 tablet, Rfl: 3   ziprasidone (GEODON)  60 MG capsule, Take 1 capsule (60 mg total) by mouth at bedtime., Disp: 90 capsule, Rfl: 3 Social History   Socioeconomic History   Marital status: Single    Spouse name: Not on file   Number of children: 0   Years of education: HS   Highest education level: 12th grade  Occupational History   Occupation: disabled    Fish farm manager: UNEMPLOYED  Tobacco Use   Smoking status: Never   Smokeless tobacco: Never  Vaping Use   Vaping Use: Never used  Substance and Sexual Activity   Alcohol use: No   Drug use: No   Sexual activity: Never    Birth control/protection: Post-menopausal    Comment: LMP 10/2017  Other Topics Concern   Not on file  Social History Narrative   Patient is right handed.   Patient drinks 2 glasses of caffeine daily.   Lives at home. Her niece is living with her right now.   Social Determinants of Health   Financial Resource Strain: Low Risk  (03/14/2022)   Overall Financial Resource Strain (CARDIA)    Difficulty of Paying Living Expenses: Not very hard  Food Insecurity: No Food Insecurity (03/14/2022)   Hunger Vital Sign    Worried About Running Out of Food in the Last Year: Never true    Ran Out of Food in the Last Year: Never true  Transportation Needs: No Transportation  Needs (03/14/2022)   PRAPARE - Hydrologist (Medical): No    Lack of Transportation (Non-Medical): No  Physical Activity: Insufficiently Active (03/14/2022)   Exercise Vital Sign    Days of Exercise per Week: 3 days    Minutes of Exercise per Session: 30 min  Stress: No Stress Concern Present (03/14/2022)   [place]    Feeling of Stress : Only a little  Social Connections: Moderately Integrated (03/14/2022)   Social Connection and Isolation Panel [NHANES]    Frequency of Communication with Friends and Family: More than three times a week    Frequency of Social Gatherings with Friends and Family: More than three times a week    Attends Religious Services: More than 4 times per year    Active Member of [employer] or Organizations: Yes    Attends Archivist Meetings: More than 4 times per year    Marital Status: Never married  Intimate Partner Violence: Not At Risk (03/14/2022)   Humiliation, Afraid, Rape, and Kick questionnaire    Fear of Current or Ex-Partner: No    Emotionally Abused: No    Physically Abused: No    Sexually Abused: No   Family History  Problem Relation Age of Onset   Asthma Father    Allergies Father    Heart disease Father        enlarged heart   Peripheral vascular disease Father    Diabetes Father    Hyperlipidemia Father    Arthritis Father    Asthma Sister    Cancer Sister        colon at 60 yr old.   Colon cancer Sister    Allergies Mother    Stroke Mother 63       with hemi paralysis   Diabetes Mother    Hyperlipidemia Mother    Hypertension Mother    GI problems Mother    Arthritis Mother    Allergies Brother    Early death Brother 72  congenital abormality   Allergies Sister    Diabetes Sister    Asthma Sister    Colon polyps Sister    Hyperlipidemia Sister    GI problems Sister        gastroporesis    Liver disease Sister        fatty  liver   Stroke Sister        intercrandial bleed   Diabetes Brother    Hypertension Brother    Hyperlipidemia Brother    Heart disease Paternal Aunt    Migraines Neg Hx     Objective: Office vital signs reviewed. BP 124/79   Pulse 90   Temp 98.7 F (37.1 C)   Ht 5' 4"$  (1.626 m)   Wt 166 lb (75.3 kg)   LMP 10/17/2017 (Approximate)   SpO2 98%   BMI 28.49 kg/m   Physical Examination:  General: Awake, alert, well nourished, No acute distress HEENT: Sclera white.  Moist mucous brains.  No exophthalmos.  No goiter Cardio: regular rate and rhythm, S1S2 heard, no murmurs appreciated Pulm: clear to auscultation bilaterally, no wheezes, rhonchi or rales; normal work of breathing on room air Extremities: warm, well perfused, No edema, cyanosis or clubbing; +2 pulses bilaterally MSK: Ambulating independently.  Moves upper extremities independently Neuro: See DM foot.  No tremor  Diabetic Foot Exam - Simple   Simple Foot Form Diabetic Foot exam was performed with the following findings: Yes 01/27/2023  8:50 AM  Visual Inspection No deformities, no ulcerations, no other skin breakdown bilaterally: Yes Sensation Testing Intact to touch and monofilament testing bilaterally: Yes Pulse Check Posterior Tibialis and Dorsalis pulse intact bilaterally: Yes Comments     Assessment/ Plan: 57 y.o. female   Diet-controlled diabetes mellitus ([name]) - Plan: CMP14+EGFR, Bayer DCA Hb A1c Waived, Microalbumin / creatinine urine ratio  Hypertension associated with diabetes ([place]) - Plan: CMP14+EGFR  Hyperlipidemia associated with type 2 diabetes mellitus ([place]) - Plan: CMP14+EGFR  Statin-induced myositis  History of Roux-en-Y gastric bypass - Plan: Vitamin B12, VITAMIN D 25 Hydroxy (Vit-D Deficiency, Fractures)  Acquired hypothyroidism - Plan: T4, free, TSH  Vitamin B 12 deficiency - Plan: Vitamin B12  Vitamin D deficiency - Plan: VITAMIN D 25 Hydroxy (Vit-D Deficiency,  Fractures)  Cervical radiculopathy  Diabetes remains diet controlled.  A1c has risen slightly however and is up to 6.1.  I prefer her to stay around this region given recurrent hypoglycemia in the past.  Check urine microalbumin.  Diabetic foot exam performed.  Release of information form completed for diabetic eye exam  Blood pressure is well-controlled.  No changes.  Not due for fasting lipid.  Check renal function, liver enzymes.  Has history of statin induced myositis and not currently treated with statin  Check B12, vitamin D levels given history of Roux-en-Y gastric bypass with previous vitamin D deficiency  Check thyroid levels.  Asymptomatic and no concerning features on exam today  Having cervical radiculopathy with planned injection Wednesday with her specialist.  Anticipate she may need spinal decompression.  She will see me in the next 3 to 4 months if needs preop clearance, otherwise okay to see me in 4 to 6 months for interval checkup  Orders Placed This Encounter  Procedures   Vitamin B12   VITAMIN D 25 Hydroxy (Vit-D Deficiency, Fractures)   CMP14+EGFR   Bayer DCA Hb A1c Waived   Microalbumin / creatinine urine ratio   T4, free   TSH   No orders of the defined  types were placed in this encounter.    Janora Norlander, DO [place] [phone]

## 2023-01-28 LAB — CMP14+EGFR
ALT: 20 IU/L (ref 0–32)
AST: 23 IU/L (ref 0–40)
Albumin/Globulin Ratio: 2.3 — ABNORMAL HIGH (ref 1.2–2.2)
Albumin: 4.3 g/dL (ref 3.8–4.9)
Alkaline Phosphatase: 62 IU/L (ref 44–121)
BUN/Creatinine Ratio: 30 — ABNORMAL HIGH (ref 9–23)
BUN: 20 mg/dL (ref 6–24)
Bilirubin Total: <0.2 mg/dL (ref 0.0–1.2)
CO2: 24 mmol/L (ref 20–29)
Calcium: 9.2 mg/dL (ref 8.7–10.2)
Chloride: 103 mmol/L (ref 96–106)
Creatinine, Ser: 0.66 mg/dL (ref 0.57–1.00)
Globulin, Total: 1.9 g/dL (ref 1.5–4.5)
Glucose: 86 mg/dL (ref 70–99)
Potassium: 4.7 mmol/L (ref 3.5–5.2)
Sodium: 147 mmol/L — ABNORMAL HIGH (ref 134–144)
Total Protein: 6.2 g/dL (ref 6.0–8.5)
eGFR: 104 mL/min/{1.73_m2} (ref >59)

## 2023-01-28 LAB — VITAMIN D 25 HYDROXY (VIT D DEFICIENCY, FRACTURES): Vit D, 25-Hydroxy: 74.8 ng/mL (ref 30.0–100.0)

## 2023-01-28 LAB — TSH: TSH: 1.81 u[IU]/mL (ref 0.450–4.500)

## 2023-01-28 LAB — T4, FREE: Free T4: 1.04 ng/dL (ref 0.82–1.77)

## 2023-01-28 LAB — VITAMIN B12: Vitamin B-12: 1968 pg/mL — ABNORMAL HIGH (ref 232–1245)

## 2023-01-29 LAB — MICROALBUMIN / CREATININE URINE RATIO
Creatinine, Urine: 130.6 mg/dL
Microalb/Creat Ratio: 6 mg/g creat (ref 0–29)
Microalbumin, Urine: 7.8 ug/mL

## 2023-02-01 DIAGNOSIS — M5412 Radiculopathy, cervical region: Secondary | ICD-10-CM | POA: Diagnosis not present

## 2023-02-15 DIAGNOSIS — M542 Cervicalgia: Secondary | ICD-10-CM | POA: Diagnosis not present

## 2023-02-17 DIAGNOSIS — M19071 Primary osteoarthritis, right ankle and foot: Secondary | ICD-10-CM | POA: Diagnosis not present

## 2023-02-17 DIAGNOSIS — M1712 Unilateral primary osteoarthritis, left knee: Secondary | ICD-10-CM | POA: Diagnosis not present

## 2023-02-20 ENCOUNTER — Ambulatory Visit (INDEPENDENT_AMBULATORY_CARE_PROVIDER_SITE_OTHER): Payer: Medicare Other | Admitting: Family Medicine

## 2023-02-20 ENCOUNTER — Encounter: Payer: Self-pay | Admitting: Family Medicine

## 2023-02-20 VITALS — BP 128/73 | HR 68 | Temp 97.5°F | Ht 64.0 in | Wt 171.8 lb

## 2023-02-20 DIAGNOSIS — M5412 Radiculopathy, cervical region: Secondary | ICD-10-CM

## 2023-02-20 DIAGNOSIS — J0100 Acute maxillary sinusitis, unspecified: Secondary | ICD-10-CM

## 2023-02-20 MED ORDER — AMOXICILLIN-POT CLAVULANATE 875-125 MG PO TABS: 1.0000 | ORAL_TABLET | Freq: Two times a day (BID) | ORAL | 0 refills | Status: DC

## 2023-02-20 MED ORDER — PSEUDOEPHEDRINE-GUAIFENESIN ER 120-1200 MG PO TB12: 1.0000 | ORAL_TABLET | Freq: Two times a day (BID) | ORAL | 0 refills | Status: DC

## 2023-02-20 MED ORDER — TIZANIDINE HCL 2 MG PO TABS: 2.0000 mg | ORAL_TABLET | Freq: Three times a day (TID) | ORAL | 3 refills | Status: DC | PRN

## 2023-02-20 NOTE — Progress Notes (Signed)
Chief Complaint  Patient presents with   Sinusitis    HPI  Patient presents today for Symptoms include congestion, facial pain, nasal congestion, green productive cough, post nasal drip and sinus pressure. There is low- grade fever, chills, or sweats. Onset of symptoms was a few days ago, gradually worsening since that time.   Upper back pain at base of neck. Radiates to the left shoulder blade. 9/10. Has had multiple back surgeries.Doesn't want prednisone since she has had weight loss surgery. Used to weight 345 lbs.    PMH: Smoking status noted ROS: Per HPI  Objective: BP 128/73   Pulse 68   Temp (!) 97.5 F (36.4 C)   Ht '5\' 4"'$  (1.626 m)   Wt 171 lb 12.8 oz (77.9 kg)   LMP 10/17/2017 (Approximate)   SpO2 93%   BMI 29.49 kg/m  Gen: NAD, alert, cooperative with exam HEENT: NCAT, EOMI, PERRL CV: RRR, good S1/S2, no murmur Resp: CTABL, no wheezes, non-labored Abd: SNTND, BS present, no guarding or organomegaly Ext: No edema, warm. Moderate tenderness at posterior neck. Spasm at left rhomboideus. Neuro: Alert and oriented, No gross deficits  Assessment and plan:  1. Acute maxillary sinusitis, recurrence not specified   2. Cervical radiculopathy     Meds ordered this encounter  Medications   amoxicillin-clavulanate (AUGMENTIN) 875-125 MG tablet    Sig: Take 1 tablet by mouth 2 (two) times daily. Take all of this medication    Dispense:  20 tablet    Refill:  0   Pseudoephedrine-Guaifenesin [phone] MG TB12    Sig: Take 1 tablet by mouth 2 (two) times daily. For congestion    Dispense:  16 tablet    Refill:  0   tiZANidine (ZANAFLEX) 2 MG tablet    Sig: Take 1 tablet (2 mg total) by mouth every 8 (eight) hours as needed.    Dispense:  30 tablet    Refill:  3    Patients needs follow up appointment prior to anymore refills. Please inform patient. Thank you.    No orders of the defined types were placed in this encounter.   Follow up as needed.  Claretta Fraise,  MD

## 2023-02-27 ENCOUNTER — Telehealth: Payer: Self-pay | Admitting: Family Medicine

## 2023-02-27 DIAGNOSIS — M5412 Radiculopathy, cervical region: Secondary | ICD-10-CM

## 2023-02-27 MED ORDER — METHOCARBAMOL 500 MG PO TABS: 500.0000 mg | ORAL_TABLET | Freq: Three times a day (TID) | ORAL | 0 refills | Status: DC | PRN

## 2023-02-27 NOTE — Telephone Encounter (Signed)
Pt seen last week and says that tiZANidine (ZANAFLEX) 2 MG tablet is not working. Pt is still having muscle spasm-neck and shoulder Pt asking for something different or a little stronger. Please call back. Use [employer]

## 2023-02-27 NOTE — Telephone Encounter (Signed)
Stacks saw her but I've gone ahead and changed med to robaxin.  Please make sure she follows up with neurosurgery if symptoms are not getting better.

## 2023-03-06 ENCOUNTER — Encounter: Payer: Self-pay | Admitting: Family Medicine

## 2023-03-06 ENCOUNTER — Ambulatory Visit (INDEPENDENT_AMBULATORY_CARE_PROVIDER_SITE_OTHER): Payer: Medicare Other | Admitting: Family Medicine

## 2023-03-06 VITALS — BP 116/74 | HR 65 | Temp 98.6°F | Ht 64.0 in | Wt 170.0 lb

## 2023-03-06 DIAGNOSIS — M62830 Muscle spasm of back: Secondary | ICD-10-CM

## 2023-03-06 MED ORDER — PREDNISONE 20 MG PO TABS: ORAL_TABLET | ORAL | 0 refills | Status: DC

## 2023-03-06 MED ORDER — METHOCARBAMOL 750 MG PO TABS: 750.0000 mg | ORAL_TABLET | Freq: Three times a day (TID) | ORAL | 0 refills | Status: DC | PRN

## 2023-03-06 NOTE — Progress Notes (Signed)
Subjective: CC: Thoracic back pain PCP: Janora Norlander, DO EB:[id number] Morgan [name] is a 57 y.o. female presenting to clinic today for:  1.  Thoracic back pain Patient has been to both her neurologist and a spinal specialist.  She had a injection done on the right side in efforts to alleviate some back pain but she notes that it really did not help at all.  She is been utilizing the methocarbamol 500 mg as directed as needed.  This brings a pain from a 9 out of 10 to an 8 out of 10 but again she really does not have any substantial improvement in symptoms.  Certain positions cause pain to be worse.  OTC analgesics including topicals, heat or ice have not really helped pain.  No upper extremity weakness reported   ROS: Per HPI  Allergies  Allergen Reactions   Ketoconazole Hives and Swelling    SWELLING REACTION UNSPECIFIED    Pravachol [Pravastatin Sodium] Shortness Of Breath, Swelling and Anaphylaxis    Throat swelling   Statins Other (See Comments)    Leg cramps   Tape Dermatitis and Other (See Comments)    Steri-Strips   Codeine Hypertension    increased BP   Past Medical History:  Diagnosis Date   Allergic rhinitis    Anemia     after gastric bypass in 2018   Anxiety    Barrett's esophagus    Bipolar affective disorder ([place])    CAP (community acquired pneumonia) 07/20/2018   Chronic respiratory failure (HCC)    Constipation, chronic    Degenerative joint disease of spine    Depression    Diabetes mellitus without complication ([place])    type 2    DM (diabetes mellitus) ([place])    "hypoglycemic" per pt - no longer takes DM meds due to weight loss from gastric bypass in 2018   Dry eye    Family history of adverse reaction to anesthesia    nausea and vomiting   Gastroparesis    GERD (gastroesophageal reflux disease)    H/O shoulder replacement 08/11/2020   left shoulder   History of bariatric surgery 03/2017   History of hiatal hernia    HLD (hyperlipidemia) 03/12/2013    Hyperlipidemia    Hypertension    No HTN meds since weight loss from Gastric Bypass in 2018   Intractable chronic migraine without aura 06/04/2015   Migraine headache    Morbid obesity (HCC)    OSA (obstructive sleep apnea)    No cpap since gastric surgery   Osteoarthritis    bilateral knee   SBO (small bowel obstruction) ([place]) 03/19/2019   Sleep apnea    Unspecified hypothyroidism    Vitamin B 12 deficiency    Vitamin D deficiency     Current Outpatient Medications:    amoxicillin-clavulanate (AUGMENTIN) 875-125 MG tablet, Take 1 tablet by mouth 2 (two) times daily. Take all of this medication, Disp: 20 tablet, Rfl: 0   Calcium-Vitamin D-Vitamin K 650-12.5-40 MG-MCG-MCG CHEW, Chew 1 each by mouth 2 (two) times daily., Disp: , Rfl:    dicyclomine (BENTYL) 20 MG tablet, TAKE 1 TABLET EVERY 8 HOURS AS NEEDED FOR ABDOMINAL CRAMPING, Disp: 30 tablet, Rfl: 2   divalproex (DEPAKOTE) 500 MG DR tablet, Take 500 mg by mouth in the morning and 1000 mg at bedtime, Disp: 270 tablet, Rfl: 3   ezetimibe (ZETIA) 10 MG tablet, Take 1 tablet (10 mg total) by mouth daily., Disp: 90 tablet, Rfl: 3  fluticasone (FLONASE) 50 MCG/ACT nasal spray, USE 2 SPRAYS IN EACH NOSTRIL TWICE DAILY AS NEEDED FOR ALLERGY, Disp: 16 g, Rfl: 4   gabapentin (NEURONTIN) 300 MG capsule, Takes 2 capsules in morning, one cap at lunch, 2 caps at bedtime, Disp: 450 capsule, Rfl: 3   Galcanezumab-gnlm (EMGALITY) 120 MG/ML SOAJ, Inject 120 mg into the skin every 30 (thirty) days., Disp: 3 mL, Rfl: 3   Glucagon, rDNA, (GLUCAGON EMERGENCY IJ), Inject 1 Syringe as directed daily as needed (emergency low blood sugar). , Disp: , Rfl:    glucose blood (ONETOUCH ULTRA) test strip, Check BS four times a day Dx E11.9, Disp: 400 strip, Rfl: 3   levothyroxine (SYNTHROID) 112 MCG tablet, TAKE ONE TABLET ONCE DAILY BEFORE BREAKFAST, Disp: 90 tablet, Rfl: 1   LINZESS 145 MCG CAPS capsule, TAKE (1) CAPSULE DAILY BEFORE BREAKFAST., Disp: 90  capsule, Rfl: 3   loratadine (CLARITIN) 10 MG tablet, Take 10 mg by mouth daily as needed for allergies. , Disp: , Rfl:    methocarbamol (ROBAXIN) 500 MG tablet, Take 1 tablet (500 mg total) by mouth every 8 (eight) hours as needed for muscle spasms., Disp: 30 tablet, Rfl: 0   mirabegron ER (MYRBETRIQ) 25 MG TB24 tablet, Take 1 tablet (25 mg total) by mouth daily., Disp: 90 tablet, Rfl: 3   Multiple Vitamins-Minerals (BARIATRIC MULTIVITAMINS/IRON PO), Take 1 tablet by mouth daily., Disp: , Rfl:    nystatin powder, APPLY TO AFFECTED AREA OF GROIN RASH TWICE A DAY FOR 7 TO 10 DAYS PER FLARE, Disp: 30 g, Rfl: 1   pantoprazole (PROTONIX) 40 MG tablet, Take 1 tablet (40 mg total) by mouth daily., Disp: 90 tablet, Rfl: 3   polyethylene glycol (MIRALAX / GLYCOLAX) packet, Take 17 g by mouth at bedtime., Disp: , Rfl:    Probiotic Product (PROBIOTIC DAILY PO), Take 1 capsule by mouth daily., Disp: , Rfl:    Pseudoephedrine-Guaifenesin [phone] [phone] MG TB12, Take 1 tablet by mouth 2 (two) times daily. For congestion, Disp: 16 tablet, Rfl: 0   rizatriptan (MAXALT-MLT) 10 MG disintegrating tablet, Take 1 tablet (10 mg total) by mouth as needed (take 1 tablet as needed for migraine. may repeat in 2 hours if needed)., Disp: 10 tablet, Rfl: 2   sertraline (ZOLOFT) 100 MG tablet, TAKE 2 TABLETS DAILY AS DIRECTED, Disp: 180 tablet, Rfl: 3   Simethicone (GAS-X EXTRA STRENGTH) 125 MG CAPS, Take 125 mg by mouth in the morning, at noon, and at bedtime., Disp: , Rfl:    ziprasidone (GEODON) 60 MG capsule, Take 1 capsule (60 mg total) by mouth at bedtime., Disp: 90 capsule, Rfl: 3 Social History   Socioeconomic History   Marital status: Single    Spouse name: Not on file   Number of children: 0   Years of education: HS   Highest education level: 12th grade  Occupational History   Occupation: disabled    Fish farm manager: UNEMPLOYED  Tobacco Use   Smoking status: Never   Smokeless tobacco: Never  Vaping Use   Vaping Use:  Never used  Substance and Sexual Activity   Alcohol use: No   Drug use: No   Sexual activity: Never    Birth control/protection: Post-menopausal    Comment: LMP 10/2017  Other Topics Concern   Not on file  Social History Narrative   Patient is right handed.   Patient drinks 2 glasses of caffeine daily.   Lives at home. Her niece is living with her right now.  Social Determinants of Health   Financial Resource Strain: Low Risk  (03/14/2022)   Overall Financial Resource Strain (CARDIA)    Difficulty of Paying Living Expenses: Not very hard  Food Insecurity: No Food Insecurity (03/14/2022)   Hunger Vital Sign    Worried About Running Out of Food in the Last Year: Never true    Ran Out of Food in the Last Year: Never true  Transportation Needs: No Transportation Needs (03/14/2022)   PRAPARE - Hydrologist (Medical): No    Lack of Transportation (Non-Medical): No  Physical Activity: Insufficiently Active (03/14/2022)   Exercise Vital Sign    Days of Exercise per Week: 3 days    Minutes of Exercise per Session: 30 min  Stress: No Stress Concern Present (03/14/2022)   [place]    Feeling of Stress : Only a little  Social Connections: Moderately Integrated (03/14/2022)   Social Connection and Isolation Panel [NHANES]    Frequency of Communication with Friends and Family: More than three times a week    Frequency of Social Gatherings with Friends and Family: More than three times a week    Attends Religious Services: More than 4 times per year    Active Member of [employer] or Organizations: Yes    Attends Archivist Meetings: More than 4 times per year    Marital Status: Never married  Intimate Partner Violence: Not At Risk (03/14/2022)   Humiliation, Afraid, Rape, and Kick questionnaire    Fear of Current or Ex-Partner: No    Emotionally Abused: No    Physically Abused: No    Sexually  Abused: No   Family History  Problem Relation Age of Onset   Asthma Father    Allergies Father    Heart disease Father        enlarged heart   Peripheral vascular disease Father    Diabetes Father    Hyperlipidemia Father    Arthritis Father    Asthma Sister    Cancer Sister        colon at 31 yr old.   Colon cancer Sister    Allergies Mother    Stroke Mother 53       with hemi paralysis   Diabetes Mother    Hyperlipidemia Mother    Hypertension Mother    GI problems Mother    Arthritis Mother    Allergies Brother    Early death Brother 77       congenital abormality   Allergies Sister    Diabetes Sister    Asthma Sister    Colon polyps Sister    Hyperlipidemia Sister    GI problems Sister        gastroporesis    Liver disease Sister        fatty liver   Stroke Sister        intercrandial bleed   Diabetes Brother    Hypertension Brother    Hyperlipidemia Brother    Heart disease Paternal Aunt    Migraines Neg Hx     Objective: Office vital signs reviewed. BP 116/74   Pulse 65   Temp 98.6 F (37 C)   Ht 5\' 4"  (1.626 m)   Wt 170 lb (77.1 kg)   LMP 10/17/2017 (Approximate)   SpO2 95%   BMI 29.18 kg/m   Physical Examination:  General: Awake, alert, well nourished, No acute distress MSK:   Thoracic  spine: No midline tenderness palpation.  She has exquisite tenderness palpation along the left paraspinal muscle and left rhomboid.  Assessment/ Plan: 57 y.o. female   Spasm of thoracic back muscle - Plan: methocarbamol (ROBAXIN-750) 750 MG tablet, predniSONE (DELTASONE) 20 MG tablet, Ambulatory referral to Physical Therapy  Uncertain etiology of thoracic back pain.  I considered referred visceral pain but she has surgically absent gallbladder.  She is tender to palpation in the paraspinal region of the left thoracic spine.  Going to raise her methocarbamol dose to 750 mg 3 times daily as needed since this has been somewhat helpful at the 500 mg dose and going  to place her on prednisone burst in efforts to decrease inflammation.  Referral to physical therapy for possible soft tissue work  No orders of the defined types were placed in this encounter.  No orders of the defined types were placed in this encounter.    Janora Norlander, DO [place] [phone]

## 2023-03-15 ENCOUNTER — Telehealth: Payer: Self-pay | Admitting: Family Medicine

## 2023-03-15 ENCOUNTER — Encounter: Payer: Self-pay | Admitting: Physical Therapy

## 2023-03-15 ENCOUNTER — Other Ambulatory Visit: Payer: Self-pay

## 2023-03-15 ENCOUNTER — Ambulatory Visit: Payer: Medicare Other | Attending: Family Medicine | Admitting: Physical Therapy

## 2023-03-15 DIAGNOSIS — M5459 Other low back pain: Secondary | ICD-10-CM | POA: Insufficient documentation

## 2023-03-15 DIAGNOSIS — M546 Pain in thoracic spine: Secondary | ICD-10-CM | POA: Diagnosis not present

## 2023-03-15 DIAGNOSIS — M62830 Muscle spasm of back: Secondary | ICD-10-CM | POA: Insufficient documentation

## 2023-03-15 NOTE — Therapy (Signed)
OUTPATIENT PHYSICAL THERAPY THORACOLUMBAR EVALUATION   Patient Name: Morgan [name] MRN: [id number] DOB:[birth year], 57 y.o., female Today's Date: 03/15/2023  END OF SESSION:  PT End of Session - 03/15/23 0840     Visit Number 1    Number of Visits 12    Date for PT Re-Evaluation 04/26/23    Authorization Type FOTO.    PT Start Time 0813    PT Stop Time 0901    PT Time Calculation (min) 48 min    Activity Tolerance Patient tolerated treatment well    Behavior During Therapy WFL for tasks assessed/performed             Past Medical History:  Diagnosis Date   Allergic rhinitis    Anemia     after gastric bypass in 2018   Anxiety    Barrett's esophagus    Bipolar affective disorder    CAP (community acquired pneumonia) 07/20/2018   Chronic respiratory failure    Constipation, chronic    Degenerative joint disease of spine    Depression    Diabetes mellitus without complication    type 2    DM (diabetes mellitus)    "hypoglycemic" per pt - no longer takes DM meds due to weight loss from gastric bypass in 2018   Dry eye    Family history of adverse reaction to anesthesia    nausea and vomiting   Gastroparesis    GERD (gastroesophageal reflux disease)    H/O shoulder replacement 08/11/2020   left shoulder   History of bariatric surgery 03/2017   History of hiatal hernia    HLD (hyperlipidemia) 03/12/2013   Hyperlipidemia    Hypertension    No HTN meds since weight loss from Gastric Bypass in 2018   Intractable chronic migraine without aura 06/04/2015   Migraine headache    Morbid obesity    OSA (obstructive sleep apnea)    No cpap since gastric surgery   Osteoarthritis    bilateral knee   SBO (small bowel obstruction) 03/19/2019   Sleep apnea    Unspecified hypothyroidism    Vitamin B 12 deficiency    Vitamin D deficiency    Past Surgical History:  Procedure Laterality Date   ANKLE ARTHROSCOPY WITH RECONSTRUCTION Right 09/24/2019   Procedure: ANKLE  ARTHROSCOPY DEBRIDEMENT TREATMENT OF OSTEOCHONDRAL LESION TALUS. PRONEAL TENDON DEBRIDEMENT;  Surgeon: Erle Crocker, MD;  Location: [facility];  Service: Orthopedics;  Laterality: Right;  SURGERY REQUEST TIME 2 HOURS   APPENDECTOMY  1978   CHOLECYSTECTOMY  2005   COLONOSCOPY     disc repair  05/17/2019   with rods, lumbar spine   GASTRIC ROUX-EN-Y N/A 03/21/2017   Procedure: LAPAROSCOPIC ROUX-EN-Y GASTRIC, UPPER ENDO;  Surgeon: Greer Pickerel, MD;  Location: [facility];  Service: General;  Laterality: N/A;   GASTROSTOMY N/A 06/19/2018   Procedure: LAPRASCOPIC INSERTION OF GASTROSTOMY TUBE;  Surgeon: Greer Pickerel, MD;  Location: [facility];  Service: General;  Laterality: N/A;   GASTROSTOMY TUBE PLACEMENT Left    06/2018   HIATAL HERNIA REPAIR N/A 06/19/2018   Procedure: LAPAROSCOPIC REPAIR OF HIATAL HERNIA;  Surgeon: Greer Pickerel, MD;  Location: [facility];  Service: General;  Laterality: N/A;   LAPAROSCOPIC LYSIS OF ADHESIONS  03/19/2019   Dr. Romana Juniper   LAPAROSCOPY N/A 06/19/2018   Procedure: LAPAROSCOPY DIAGNOSTIC;  Surgeon: Greer Pickerel, MD;  Location: [facility];  Service: General;  Laterality: N/A;   LAPAROSCOPY N/A 03/19/2019   Procedure: LAPAROSCOPY DIAGNOSTIC  lysis of adhesions;  Surgeon: Clovis Riley, MD;  Location: [facility];  Service: General;  Laterality: N/A;   LUMBAR LAMINECTOMY/DECOMPRESSION MICRODISCECTOMY Right 10/11/2018   Procedure: LAMINECTOMY AND FORAMINOTOMY RIGHT LUMBAR FOUR- LUMBAR FIVE;  Surgeon: Consuella Lose, MD;  Location: [place];  Service: Neurosurgery;  Laterality: Right;   MASS EXCISION Right 09/23/2021   Procedure: EXCISION MASS VOLAR ASPECT RIGHT HAND;  Surgeon: Leanora Cover, MD;  Location: [facility];  Service: Orthopedics;  Laterality: Right;  45 MIN   SHOULDER ARTHROSCOPY  06/2011   left-dsc   TONSILLECTOMY  at age 26   TOTAL SHOULDER ARTHROPLASTY Left 08/11/2020   Procedure: TOTAL SHOULDER ARTHROPLASTY;  Surgeon: Marchia Bond, MD;  Location: [place];  Service: Orthopedics;  Laterality: Left;   TRIGGER FINGER RELEASE  12/20/2012   Procedure: RELEASE TRIGGER FINGER/A-1 PULLEY;  Surgeon: Tennis Must, MD;  Location: [place];  Service: Orthopedics;  Laterality: Left;  LEFT TRIGGER THUMB RELEASE   TRIGGER FINGER RELEASE Right 09/23/2021   Procedure: RELEASE A-1 PULLEY RIGHT INDEX FINGER;  Surgeon: Leanora Cover, MD;  Location: [place];  Service: Orthopedics;  Laterality: Right;   Patient Active Problem List   Diagnosis Date Noted   Spondylolysis of lumbar region 09/09/2022   Osteoarthritis of left shoulder 08/11/2020   S/P shoulder replacement, left 08/11/2020   AMS (altered mental status) 05/19/2019   SBO (small bowel obstruction) s/p lap LOA 03/19/2019 03/19/2019   Reactive hypoglycemia 02/12/2019   Chronic migraine without aura, with intractable migraine, so stated, with status migrainosus 11/20/2018   Lumbar radiculopathy 10/11/2018   Atelectasis    Hypoglycemia    Hypotension    Bradycardia    Epigastric pain 06/19/2018   History of adenomatous polyp of colon 10/23/2017   Hyperlipidemia associated with type 2 diabetes mellitus 07/06/2017   GERD (gastroesophageal reflux disease) 03/21/2017   History of Roux-en-Y gastric bypass 2018 03/21/2017   Intractable chronic migraine without aura 06/04/2015   Abnormal uterine bleeding 05/13/2015   Pancreatitis 02/27/2014   Fatty liver disease, nonalcoholic [id number]   Metabolic syndrome [id number]   Vitamin D deficiency    DM (diabetes mellitus) 03/12/2013   Surgery, elective 06/12/2011   Hypothyroidism    Constipation, chronic    Vitamin B 12 deficiency    ALLERGIC RHINITIS 12/31/2010   BARRETTS ESOPHAGUS 12/31/2010   Gastroparesis 12/31/2010   Bilateral chronic knee pain 12/31/2010   Obstructive sleep apnea 09/27/2010   Migraine 09/27/2010   Cyst of ovary 10/19/2009     REFERRING PROVIDER:  Ronnie Doss DO.  REFERRING DIAG: Spasm of Thoracic muscle.  Rationale for Evaluation and Treatment: Rehabilitation  THERAPY DIAG:  Pain in thoracic spine - Plan: PT plan of care cert/re-cert  Muscle spasm of back - Plan: PT plan of care cert/re-cert  ONSET DATE: January 2024.  SUBJECTIVE:  SUBJECTIVE STATEMENT: The patient presents to the clinic with c/o mid-back pain, right > left that has been ongoing since January of this year.  She thinks the pain is related to swimming in the pool for exercise and using her UE's a lot against the resistance of the water. She rates her pain at an 8/10.  UE usage and holding objects increases her pain.  Resting helps decrease her pain. Her sleep is disturbed by pain.  PERTINENT HISTORY:  Lumbar fusion (08/2022), left shoulder surgery.  PAIN:  Are you having pain? Yes: NPRS scale: 8/10 Pain location: Mid-back. Pain description: Ache, throb, sharp. Aggravating factors: As above. Relieving factors: As above.  PRECAUTIONS: None  WEIGHT BEARING RESTRICTIONS: No  FALLS:  Has patient fallen in last 6 months? No  LIVING ENVIRONMENT: Lives in: House/apartment   PLOF: Independent  PATIENT GOALS: Return to swimming and use UE's without mid-back pain.    OBJECTIVE:    PATIENT SURVEYS:  FOTO . 51.   POSTURE: rounded shoulders and forward head  PALPATION: Tender to palpation over left thoracic musculature near the medial scapular border attachment and to a lesser degree on the right.  UE ROM:   Full active bilateral UE AROM.    LOWER EXTREMITY MMT:    Left mid-trap/Rhomboid strength 4/5 per contralateral comparison.  DTR's:  UE DTR's are normal.   TODAY'S TREATMENT:                                                                                                                               DATE: HMP and IFC at 80-150 Hz on 40% scan x 20 minutes to bilateral affected Thoracic musculature.  Patient tolerated treatment without complaint with normal modality response following removal of modality.  ASSESSMENT:  CLINICAL IMPRESSION: The patient presents to [facility] with c/o thoracic back pain, left > right that has been ongoing since January of this year.  Her pain can become quite severe with increased UE usage.  She was tender to palpation over the musculature attaching to the medial scapular border.  She has some weakness on the left per contralateral comparison.  Her UE DTR's are normal.  Patient will benefit from skilled physical therapy intervention to address pain and deficits.  OBJECTIVE IMPAIRMENTS: decreased activity tolerance, decreased strength, increased muscle spasms, postural dysfunction, and pain.   ACTIVITY LIMITATIONS: carrying, lifting, bending, sitting, standing, sleeping, and reach over head  PARTICIPATION LIMITATIONS: meal prep, cleaning, and laundry  REHAB POTENTIAL: Excellent  CLINICAL DECISION MAKING: Stable/uncomplicated  EVALUATION COMPLEXITY: Low   GOALS:  SHORT TERM GOALS: Target date: 03/29/23  Ind with a HEP. Goal status: INITIAL   LONG TERM GOALS: Target date: 04/26/23  Perform ADL's with pain not > 2-3/10.  Goal status: INITIAL  2.  Sit 30 minutes with pain not > 2-3/10.  Goal status: INITIAL  3.  Return to swimming with pain not > 2-3/10.  Goal status: INITIAL PLAN:  PT  FREQUENCY: 2x/week  PT DURATION: 6 weeks  PLANNED INTERVENTIONS: Therapeutic exercises, Therapeutic activity, Patient/Family education, Self Care, Electrical stimulation, Cryotherapy, Moist heat, Ultrasound, and Manual therapy.  PLAN FOR NEXT SESSION: Combo e'stim/US, STW/M, scapular strengthening and postural exercises.   Javone Ybanez, [place], PT 03/15/2023, 9:18 AM

## 2023-03-15 NOTE — Telephone Encounter (Signed)
Contacted LADORIS HILBY to schedule their annual wellness visit. Appointment made for 03/22/2023.  Thank you,  Colletta [place],  [facility] Direct Dial ??[phone]

## 2023-03-17 ENCOUNTER — Encounter: Payer: Self-pay | Admitting: Physical Therapy

## 2023-03-17 ENCOUNTER — Ambulatory Visit: Payer: Medicare Other | Admitting: Physical Therapy

## 2023-03-17 DIAGNOSIS — M546 Pain in thoracic spine: Secondary | ICD-10-CM

## 2023-03-17 DIAGNOSIS — M5459 Other low back pain: Secondary | ICD-10-CM | POA: Diagnosis not present

## 2023-03-17 DIAGNOSIS — M62830 Muscle spasm of back: Secondary | ICD-10-CM

## 2023-03-17 NOTE — Therapy (Signed)
OUTPATIENT PHYSICAL THERAPY THORACOLUMBAR TREATMENT   Patient Name: Morgan [name] MRN: [id number] DOB:1966-08-25, 57 y.o., female Today's Date: 03/17/2023  END OF SESSION:  PT End of Session - 03/17/23 0853     Visit Number 2    Number of Visits 12    Date for PT Re-Evaluation 04/26/23    Authorization Type FOTO.    PT Start Time 0815    PT Stop Time 0907    PT Time Calculation (min) 52 min    Activity Tolerance Patient tolerated treatment well    [facility] tasks assessed/performed             Past Medical History:  Diagnosis Date   Allergic rhinitis    Anemia     after gastric bypass in 2018   Anxiety    Barrett's esophagus    Bipolar affective disorder    CAP (community acquired pneumonia) 07/20/2018   Chronic respiratory failure    Constipation, chronic    Degenerative joint disease of spine    Depression    Diabetes mellitus without complication    type 2    DM (diabetes mellitus)    "hypoglycemic" per pt - no longer takes DM meds due to weight loss from gastric bypass in 2018   Dry eye    Family history of adverse reaction to anesthesia    nausea and vomiting   Gastroparesis    GERD (gastroesophageal reflux disease)    H/O shoulder replacement 08/11/2020   left shoulder   History of bariatric surgery 03/2017   History of hiatal hernia    HLD (hyperlipidemia) 03/12/2013   Hyperlipidemia    Hypertension    No HTN meds since weight loss from Gastric Bypass in 2018   Intractable chronic migraine without aura 06/04/2015   Migraine headache    Morbid obesity    OSA (obstructive sleep apnea)    No cpap since gastric surgery   Osteoarthritis    bilateral knee   SBO (small bowel obstruction) 03/19/2019   Sleep apnea    Unspecified hypothyroidism    Vitamin B 12 deficiency    Vitamin D deficiency    Past Surgical History:  Procedure Laterality Date   ANKLE ARTHROSCOPY WITH RECONSTRUCTION Right 09/24/2019   Procedure: ANKLE  ARTHROSCOPY DEBRIDEMENT TREATMENT OF OSTEOCHONDRAL LESION TALUS. PRONEAL TENDON DEBRIDEMENT;  Surgeon: Terance HartAdair, Christopher R, MD;  Location: [facility];  Service: Orthopedics;  Laterality: Right;  SURGERY REQUEST TIME 2 HOURS   APPENDECTOMY  1978   CHOLECYSTECTOMY  2005   COLONOSCOPY     disc repair  05/17/2019   with rods, lumbar spine   GASTRIC ROUX-EN-Y N/A 03/21/2017   Procedure: LAPAROSCOPIC ROUX-EN-Y GASTRIC, UPPER ENDO;  Surgeon: Gaynelle AduEric Wilson, MD;  Location: [facility];  Service: General;  Laterality: N/A;   GASTROSTOMY N/A 06/19/2018   Procedure: LAPRASCOPIC INSERTION OF GASTROSTOMY TUBE;  Surgeon: Gaynelle AduWilson, Eric, MD;  Location: [facility];  Service: General;  Laterality: N/A;   GASTROSTOMY TUBE PLACEMENT Left    06/2018   HIATAL HERNIA REPAIR N/A 06/19/2018   Procedure: LAPAROSCOPIC REPAIR OF HIATAL HERNIA;  Surgeon: Gaynelle AduWilson, Eric, MD;  [facility];  Service: General;  Laterality: N/A;   LAPAROSCOPIC LYSIS OF ADHESIONS  03/19/2019   Dr. Phylliss Blakeshelsea Connor   LAPAROSCOPY N/A 06/19/2018   Procedure: LAPAROSCOPY DIAGNOSTIC;  Surgeon: Gaynelle AduWilson, Eric, MD;  Location: [facility];  Service: General;  Laterality: N/A;   LAPAROSCOPY N/A 03/19/2019   Procedure: LAPAROSCOPY DIAGNOSTIC  lysis of adhesions;  Surgeon: Berna Bue, MD;  Location: [facility];  Service: General;  Laterality: N/A;   LUMBAR LAMINECTOMY/DECOMPRESSION MICRODISCECTOMY Right 10/11/2018   Procedure: LAMINECTOMY AND FORAMINOTOMY RIGHT LUMBAR FOUR- LUMBAR FIVE;  Surgeon: Lisbeth Renshaw, MD;  Location: [facility];  Service: Neurosurgery;  Laterality: Right;   MASS EXCISION Right 09/23/2021   Procedure: EXCISION MASS VOLAR ASPECT RIGHT HAND;  Surgeon: Betha Loa, MD;  Location: [facility];  Service: Orthopedics;  Laterality: Right;  45 MIN   SHOULDER ARTHROSCOPY  06/2011   left-dsc   TONSILLECTOMY  at age 77   TOTAL SHOULDER ARTHROPLASTY Left 08/11/2020   Procedure: TOTAL SHOULDER ARTHROPLASTY;  Surgeon: Teryl Lucy, MD;  Location: [facility];  Service: Orthopedics;  Laterality: Left;   TRIGGER FINGER RELEASE  12/20/2012   Procedure: RELEASE TRIGGER FINGER/A-1 PULLEY;  Surgeon: Tami Ribas, MD;  Location: [facility];  Service: Orthopedics;  Laterality: Left;  LEFT TRIGGER THUMB RELEASE   TRIGGER FINGER RELEASE Right 09/23/2021   Procedure: RELEASE A-1 PULLEY RIGHT INDEX FINGER;  Surgeon: Betha Loa, MD;  Location: [facility];  Service: Orthopedics;  Laterality: Right;   Patient Active Problem List   Diagnosis Date Noted   Spondylolysis of lumbar region 09/09/2022   Osteoarthritis of left shoulder 08/11/2020   S/P shoulder replacement, left 08/11/2020   AMS (altered mental status) 05/19/2019   SBO (small bowel obstruction) s/p lap LOA 03/19/2019 03/19/2019   Reactive hypoglycemia 02/12/2019   Chronic migraine without aura, with intractable migraine, so stated, with status migrainosus 11/20/2018   Lumbar radiculopathy 10/11/2018   Atelectasis    Hypoglycemia    Hypotension    Bradycardia    Epigastric pain 06/19/2018   History of adenomatous polyp of colon 10/23/2017   Hyperlipidemia associated with type 2 diabetes mellitus 07/06/2017   GERD (gastroesophageal reflux disease) 03/21/2017   History of Roux-en-Y gastric bypass 2018 03/21/2017   Intractable chronic migraine without aura 06/04/2015   Abnormal uterine bleeding 05/13/2015   Pancreatitis 02/27/2014   Fatty liver disease, nonalcoholic 02/25/2014   Metabolic syndrome 02/20/2014   Vitamin D deficiency    DM (diabetes mellitus) 03/12/2013   Surgery, elective 06/12/2011   Hypothyroidism    Constipation, chronic    Vitamin B 12 deficiency    ALLERGIC RHINITIS 12/31/2010   BARRETTS ESOPHAGUS 12/31/2010   Gastroparesis 12/31/2010   Bilateral chronic knee pain 12/31/2010   Obstructive sleep apnea 09/27/2010   Migraine 09/27/2010   Cyst of ovary 10/19/2009     REFERRING PROVIDER:  Delynn Flavin DO.  REFERRING DIAG: Spasm of Thoracic muscle.  Rationale for Evaluation and Treatment: Rehabilitation  THERAPY DIAG:  Pain in thoracic spine  Muscle spasm of back  ONSET DATE: January 2024.  SUBJECTIVE:  SUBJECTIVE STATEMENT: Pain at a 7 on the left today.  PERTINENT HISTORY:  Lumbar fusion (08/2022), left shoulder surgery.  PAIN:  Are you having pain? Yes: NPRS scale: 7/10 Pain location: Mid-back. Pain description: Ache, throb, sharp. Aggravating factors: As above. Relieving factors: As above.  PRECAUTIONS: None  WEIGHT BEARING RESTRICTIONS: No  FALLS:  Has patient fallen in last 6 months? No  LIVING ENVIRONMENT: Lives in: House/apartment   PLOF: Independent  PATIENT GOALS: Return to swimming and use UE's without mid-back pain.    OBJECTIVE:    PATIENT SURVEYS:  FOTO . 39.   POSTURE: rounded shoulders and forward head  PALPATION: Tender to palpation over left thoracic musculature near the medial scapular border attachment and to a lesser degree on the right.  UE ROM:   Full active bilateral UE AROM.    LOWER EXTREMITY MMT:    Left mid-trap/Rhomboid strength 4/5 per contralateral comparison.  DTR's:  UE DTR's are normal.   TODAY'S TREATMENT:                                                                                                                              DATE: Combo e'stim/US at 1.50 W/CM2 x 12 minutes to affected left scapular region f/b STW/M x 11 minutes f/b HMP and IFC at 80-150 Hz on 40% scan x 20 minutes to bilateral affected Thoracic musculature.  Patient tolerated treatment without complaint with normal modality response following removal of modality.  ASSESSMENT:  CLINICAL IMPRESSION: Patient with most pain in left thoracic  region.  Palpably tender with good response to STW/M.  Patient tolerated treatment well today.    OBJECTIVE IMPAIRMENTS: decreased activity tolerance, decreased strength, increased muscle spasms, postural dysfunction, and pain.   ACTIVITY LIMITATIONS: carrying, lifting, bending, sitting, standing, sleeping, and reach over head  PARTICIPATION LIMITATIONS: meal prep, cleaning, and laundry  REHAB POTENTIAL: Excellent  CLINICAL DECISION MAKING: Stable/uncomplicated  EVALUATION COMPLEXITY: Low   GOALS:  SHORT TERM GOALS: Target date: 03/29/23  Ind with a HEP. Goal status: INITIAL   LONG TERM GOALS: Target date: 04/26/23  Perform ADL's with pain not > 2-3/10.  Goal status: INITIAL  2.  Sit 30 minutes with pain not > 2-3/10.  Goal status: INITIAL  3.  Return to swimming with pain not > 2-3/10.  Goal status: INITIAL PLAN:  PT FREQUENCY: 2x/week  PT DURATION: 6 weeks  PLANNED INTERVENTIONS: Therapeutic exercises, Therapeutic activity, Patient/Family education, Self Care, Electrical stimulation, Cryotherapy, Moist heat, Ultrasound, and Manual therapy.  PLAN FOR NEXT SESSION: Combo e'stim/US, STW/M, scapular strengthening and postural exercises.   Willona Phariss, [place], PT 03/17/2023, 9:42 AM

## 2023-03-21 ENCOUNTER — Ambulatory Visit: Payer: Medicare Other | Admitting: *Deleted

## 2023-03-21 DIAGNOSIS — M546 Pain in thoracic spine: Secondary | ICD-10-CM | POA: Diagnosis not present

## 2023-03-21 DIAGNOSIS — M62830 Muscle spasm of back: Secondary | ICD-10-CM

## 2023-03-21 DIAGNOSIS — M5459 Other low back pain: Secondary | ICD-10-CM | POA: Diagnosis not present

## 2023-03-21 NOTE — Therapy (Signed)
OUTPATIENT PHYSICAL THERAPY THORACOLUMBAR TREATMENT   Patient Name: Morgan [name] MRN: [id number] DOB:[birth year], 57 y.o., female Today's Date: 03/21/2023  END OF SESSION:  PT End of Session - 03/21/23 0810     Visit Number 3    Number of Visits 12    Date for PT Re-Evaluation 04/26/23    Authorization Type FOTO.    PT Start Time 0815    PT Stop Time 0905    PT Time Calculation (min) 50 min             Past Medical History:  Diagnosis Date   Allergic rhinitis    Anemia     after gastric bypass in 2018   Anxiety    Barrett's esophagus    Bipolar affective disorder    CAP (community acquired pneumonia) 07/20/2018   Chronic respiratory failure    Constipation, chronic    Degenerative joint disease of spine    Depression    Diabetes mellitus without complication    type 2    DM (diabetes mellitus)    "hypoglycemic" per pt - no longer takes DM meds due to weight loss from gastric bypass in 2018   Dry eye    Family history of adverse reaction to anesthesia    nausea and vomiting   Gastroparesis    GERD (gastroesophageal reflux disease)    H/O shoulder replacement 08/11/2020   left shoulder   History of bariatric surgery 03/2017   History of hiatal hernia    HLD (hyperlipidemia) 03/12/2013   Hyperlipidemia    Hypertension    No HTN meds since weight loss from Gastric Bypass in 2018   Intractable chronic migraine without aura 06/04/2015   Migraine headache    Morbid obesity    OSA (obstructive sleep apnea)    No cpap since gastric surgery   Osteoarthritis    bilateral knee   SBO (small bowel obstruction) 03/19/2019   Sleep apnea    Unspecified hypothyroidism    Vitamin B 12 deficiency    Vitamin D deficiency    Past Surgical History:  Procedure Laterality Date   ANKLE ARTHROSCOPY WITH RECONSTRUCTION Right 09/24/2019   Procedure: ANKLE ARTHROSCOPY DEBRIDEMENT TREATMENT OF OSTEOCHONDRAL LESION TALUS. PRONEAL TENDON DEBRIDEMENT;  Surgeon: Terance Hart,  MD;  Location: [facility];  Service: Orthopedics;  Laterality: Right;  SURGERY REQUEST TIME 2 HOURS   APPENDECTOMY  1978   CHOLECYSTECTOMY  2005   COLONOSCOPY     disc repair  05/17/2019   with rods, lumbar spine   GASTRIC ROUX-EN-Y N/A 03/21/2017   Procedure: LAPAROSCOPIC ROUX-EN-Y GASTRIC, UPPER ENDO;  Surgeon: Gaynelle Adu, MD;  Location: [facility];  Service: General;  Laterality: N/A;   GASTROSTOMY N/A 06/19/2018   Procedure: LAPRASCOPIC INSERTION OF GASTROSTOMY TUBE;  Surgeon: Gaynelle Adu, MD;  Location: [facility];  Service: General;  Laterality: N/A;   GASTROSTOMY TUBE PLACEMENT Left    06/2018   HIATAL HERNIA REPAIR N/A 06/19/2018   Procedure: LAPAROSCOPIC REPAIR OF HIATAL HERNIA;  Surgeon: Gaynelle Adu, MD;  [facility];  Service: General;  Laterality: N/A;   LAPAROSCOPIC LYSIS OF ADHESIONS  03/19/2019   Dr. Phylliss Blakes   LAPAROSCOPY N/A 06/19/2018   Procedure: LAPAROSCOPY DIAGNOSTIC;  Surgeon: Gaynelle Adu, MD;  Location: [facility];  Service: General;  Laterality: N/A;   LAPAROSCOPY N/A 03/19/2019   Procedure: LAPAROSCOPY DIAGNOSTIC lysis of adhesions;  Surgeon: Berna Bue, MD;  Location[facility];  Service: General;  Laterality: N/A;  LUMBAR LAMINECTOMY/DECOMPRESSION MICRODISCECTOMY Right 10/11/2018   Procedure: LAMINECTOMY AND FORAMINOTOMY RIGHT LUMBAR FOUR- LUMBAR FIVE;  Surgeon: Lisbeth RenshawNundkumar, Neelesh, MD;  Location: [facility];  Service: Neurosurgery;  Laterality: Right;   MASS EXCISION Right 09/23/2021   Procedure: EXCISION MASS VOLAR ASPECT RIGHT HAND;  Surgeon: Betha LoaKuzma, Kevin, MD;  Location: [facility];  Service: Orthopedics;  Laterality: Right;  45 MIN   SHOULDER ARTHROSCOPY  06/2011   left-dsc   TONSILLECTOMY  at age 89   TOTAL SHOULDER ARTHROPLASTY Left 08/11/2020   Procedure: TOTAL SHOULDER ARTHROPLASTY;  Surgeon: Teryl LucyLandau, Joshua, MD;  Location: [facility];  Service: Orthopedics;  Laterality: Left;   TRIGGER FINGER RELEASE   12/20/2012   Procedure: RELEASE TRIGGER FINGER/A-1 PULLEY;  Surgeon: Tami RibasKevin R Kuzma, MD;  Location: [facility];  Service: Orthopedics;  Laterality: Left;  LEFT TRIGGER THUMB RELEASE   TRIGGER FINGER RELEASE Right 09/23/2021   Procedure: RELEASE A-1 PULLEY RIGHT INDEX FINGER;  Surgeon: Betha LoaKuzma, Kevin, MD;  [facility];  Service: Orthopedics;  Laterality: Right;   Patient Active Problem List   Diagnosis Date Noted   Spondylolysis of lumbar region 09/09/2022   Osteoarthritis of left shoulder 08/11/2020   S/P shoulder replacement, left 08/11/2020   AMS (altered mental status) 05/19/2019   SBO (small bowel obstruction) s/p lap LOA 03/19/2019 03/19/2019   Reactive hypoglycemia 02/12/2019   Chronic migraine without aura, with intractable migraine, so stated, with status migrainosus 11/20/2018   Lumbar radiculopathy 10/11/2018   Atelectasis    Hypoglycemia    Hypotension    Bradycardia    Epigastric pain 06/19/2018   History of adenomatous polyp of colon 10/23/2017   Hyperlipidemia associated with type 2 diabetes mellitus 07/06/2017   GERD (gastroesophageal reflux disease) 03/21/2017   History of Roux-en-Y gastric bypass 2018 03/21/2017   Intractable chronic migraine without aura 06/04/2015   Abnormal uterine bleeding 05/13/2015   Pancreatitis 02/27/2014   Fatty liver disease, nonalcoholic 02/25/2014   Metabolic syndrome 02/20/2014   Vitamin D deficiency    DM (diabetes mellitus) 03/12/2013   Surgery, elective 06/12/2011   Hypothyroidism    Constipation, chronic    Vitamin B 12 deficiency    ALLERGIC RHINITIS 12/31/2010   BARRETTS ESOPHAGUS 12/31/2010   Gastroparesis 12/31/2010   Bilateral chronic knee pain 12/31/2010   Obstructive sleep apnea 09/27/2010   Migraine 09/27/2010   Cyst of ovary 10/19/2009     REFERRING PROVIDER: Delynn FlavinAshly Gottschalk DO.  REFERRING DIAG: Spasm of Thoracic muscle.  Rationale for Evaluation and Treatment:  Rehabilitation  THERAPY DIAG:  Pain in thoracic spine  Muscle spasm of back  ONSET DATE: January 2024.  SUBJECTIVE:  SUBJECTIVE STATEMENT: Pain at a 6 on the left side mid-back today.  PERTINENT HISTORY:  Lumbar fusion (08/2022), left shoulder surgery.  PAIN:  Are you having pain? Yes: NPRS scale: 6/10 Pain location: Mid-back. Pain description: Ache, throb, sharp. Aggravating factors: As above. Relieving factors: As above.  PRECAUTIONS: None  WEIGHT BEARING RESTRICTIONS: No  FALLS:  Has patient fallen in last 6 months? No  LIVING ENVIRONMENT: Lives in: House/apartment   PLOF: Independent  PATIENT GOALS: Return to swimming and use UE's without mid-back pain.    OBJECTIVE:    PATIENT SURVEYS:  FOTO . 39.   POSTURE: rounded shoulders and forward head  PALPATION: Tender to palpation over left thoracic musculature near the medial scapular border attachment and to a lesser degree on the right.  UE ROM:   Full active bilateral UE AROM.    LOWER EXTREMITY MMT:    Left mid-trap/Rhomboid strength 4/5 per contralateral comparison.  DTR's:  UE DTR's are normal.   TODAY'S TREATMENT:                                                                                                                              DATE: Combo e'stim/US at 1.50 W/CM2 x 12 minutes to affected left scapular region RT side lying  STW/M x 12 minutes to same area   HMP and IFC at 80-150 Hz on 40% scan x 15 minutes to LT   Thoracic/scapular musculature.  Patient tolerated treatment without complaint with normal modality response following removal of modality.  ASSESSMENT:  CLINICAL IMPRESSION: Patient with most pain in left thoracic regio again 6/10 today . Rx focused on LT side thoracic paras as well as  rhomboids.  Palpably tender with good response to [place] combo and  STW/M.  Patient tolerated treatment well today with decreased pain end of session.Try UBE once pain decreases   OBJECTIVE IMPAIRMENTS: decreased activity tolerance, decreased strength, increased muscle spasms, postural dysfunction, and pain.   ACTIVITY LIMITATIONS: carrying, lifting, bending, sitting, standing, sleeping, and reach over head  PARTICIPATION LIMITATIONS: meal prep, cleaning, and laundry  REHAB POTENTIAL: Excellent  CLINICAL DECISION MAKING: Stable/uncomplicated  EVALUATION COMPLEXITY: Low   GOALS:  SHORT TERM GOALS: Target date: 03/29/23  Ind with a HEP. Goal status: INITIAL   LONG TERM GOALS: Target date: 04/26/23  Perform ADL's with pain not > 2-3/10.  Goal status: INITIAL  2.  Sit 30 minutes with pain not > 2-3/10.  Goal status: INITIAL  3.  Return to swimming with pain not > 2-3/10.  Goal status: INITIAL PLAN:  PT FREQUENCY: 2x/week  PT DURATION: 6 weeks  PLANNED INTERVENTIONS: Therapeutic exercises, Therapeutic activity, Patient/Family education, Self Care, Electrical stimulation, Cryotherapy, Moist heat, Ultrasound, and Manual therapy.  PLAN FOR NEXT SESSION: Combo e'stim/US, STW/M, scapular strengthening and postural exercises.   Danasia Baker,CHRIS, PTA 03/21/2023, 9:09 AM

## 2023-03-22 ENCOUNTER — Ambulatory Visit (INDEPENDENT_AMBULATORY_CARE_PROVIDER_SITE_OTHER): Payer: Medicare Other

## 2023-03-22 VITALS — Ht 64.0 in | Wt 170.0 lb

## 2023-03-22 DIAGNOSIS — Z0000 Encounter for general adult medical examination without abnormal findings: Secondary | ICD-10-CM

## 2023-03-22 NOTE — Progress Notes (Signed)
Subjective:   Morgan [name] is a 57 y.o. female who presents for Medicare Annual (Subsequent) preventive examination. I connected with  NEALY HOLGERSON on 03/22/23 by a audio enabled telemedicine application and verified that I am speaking with the correct person using two identifiers.  Patient Location: Home  Provider Location: Home Office  I discussed the limitations of evaluation and management by telemedicine. The patient expressed understanding and agreed to proceed.  Review of Systems     Cardiac Risk Factors include: advanced age (>25men, >79 women);dyslipidemia;diabetes mellitus;hypertension     Objective:    Today's Vitals   03/22/23 0951  Weight: 170 lb (77.1 kg)  Height: 5\' 4"  (1.626 m)   Body mass index is 29.18 kg/m.     03/22/2023    9:55 AM 08/31/2022    8:21 AM 06/22/2022    8:38 AM 03/14/2022   10:02 AM 09/14/2021   12:24 PM 03/10/2021    9:57 AM 09/29/2020    8:53 AM  Advanced Directives  Does Patient Have a Medical Advance Directive? Yes Yes Yes Yes Yes No Yes  Type of Estate agent of [place];Living will Healthcare Power of [employer] of [place];Living will Healthcare Power of Attorney    Does patient want to make changes to medical advance directive? No - Patient declined No - Patient declined   No - Patient declined    Copy of Healthcare Power of Attorney in Chart? Yes - validated most recent copy scanned in chart (See row information) Yes - validated most recent copy scanned in chart (See row information)  Yes - validated most recent copy scanned in chart (See row information) Yes - validated most recent copy scanned in chart (See row information)    Would patient like information on creating a medical advance directive?      No - Patient declined     Current Medications (verified) Outpatient Encounter Medications as of 03/22/2023  Medication Sig   Calcium-Vitamin D-Vitamin K 650-12.5-40 MG-MCG-MCG CHEW Chew 1  each by mouth 2 (two) times daily.   dicyclomine (BENTYL) 20 MG tablet TAKE 1 TABLET EVERY 8 HOURS AS NEEDED FOR ABDOMINAL CRAMPING   divalproex (DEPAKOTE) 500 MG DR tablet Take 500 mg by mouth in the morning and 1000 mg at bedtime   ezetimibe (ZETIA) 10 MG tablet Take 1 tablet (10 mg total) by mouth daily.   fluticasone (FLONASE) 50 MCG/ACT nasal spray USE 2 SPRAYS IN EACH NOSTRIL TWICE DAILY AS NEEDED FOR ALLERGY   gabapentin (NEURONTIN) 300 MG capsule Takes 2 capsules in morning, one cap at lunch, 2 caps at bedtime   Galcanezumab-gnlm (EMGALITY) 120 MG/ML SOAJ Inject 120 mg into the skin every 30 (thirty) days.   Glucagon, rDNA, (GLUCAGON EMERGENCY IJ) Inject 1 Syringe as directed daily as needed (emergency low blood sugar).    glucose blood (ONETOUCH ULTRA) test strip Check BS four times a day Dx E11.9   levothyroxine (SYNTHROID) 112 MCG tablet TAKE ONE TABLET ONCE DAILY BEFORE BREAKFAST   LINZESS 145 MCG CAPS capsule TAKE (1) CAPSULE DAILY BEFORE BREAKFAST.   loratadine (CLARITIN) 10 MG tablet Take 10 mg by mouth daily as needed for allergies.    methocarbamol (ROBAXIN-750) 750 MG tablet Take 1 tablet (750 mg total) by mouth every 8 (eight) hours as needed for muscle spasms.   mirabegron ER (MYRBETRIQ) 25 MG TB24 tablet Take 1 tablet (25 mg total) by mouth daily.   Multiple Vitamins-Minerals (BARIATRIC MULTIVITAMINS/IRON PO) Take  1 tablet by mouth daily.   nystatin powder APPLY TO AFFECTED AREA OF GROIN RASH TWICE A DAY FOR 7 TO 10 DAYS PER FLARE   pantoprazole (PROTONIX) 40 MG tablet Take 1 tablet (40 mg total) by mouth daily.   polyethylene glycol (MIRALAX / GLYCOLAX) packet Take 17 g by mouth at bedtime.   predniSONE (DELTASONE) 20 MG tablet 2 po at same time daily for 5 days   Probiotic Product (PROBIOTIC DAILY PO) Take 1 capsule by mouth daily.   Pseudoephedrine-Guaifenesin [id number] MG TB12 Take 1 tablet by mouth 2 (two) times daily. For congestion   rizatriptan (MAXALT-MLT) 10 MG  disintegrating tablet Take 1 tablet (10 mg total) by mouth as needed (take 1 tablet as needed for migraine. may repeat in 2 hours if needed).   sertraline (ZOLOFT) 100 MG tablet TAKE 2 TABLETS DAILY AS DIRECTED   Simethicone (GAS-X EXTRA STRENGTH) 125 MG CAPS Take 125 mg by mouth in the morning, at noon, and at bedtime.   ziprasidone (GEODON) 60 MG capsule Take 1 capsule (60 mg total) by mouth at bedtime.   amoxicillin-clavulanate (AUGMENTIN) 875-125 MG tablet Take 1 tablet by mouth 2 (two) times daily. Take all of this medication (Patient not taking: Reported on 03/22/2023)   No facility-administered encounter medications on file as of 03/22/2023.    Allergies (verified) Ketoconazole, Pravachol [pravastatin sodium], Statins, Tape, and Codeine   History: Past Medical History:  Diagnosis Date   Allergic rhinitis    Anemia     after gastric bypass in 2018   Anxiety    Barrett's esophagus    Bipolar affective disorder    CAP (community acquired pneumonia) 07/20/2018   Chronic respiratory failure    Constipation, chronic    Degenerative joint disease of spine    Depression    Diabetes mellitus without complication    type 2    DM (diabetes mellitus)    "hypoglycemic" per pt - no longer takes DM meds due to weight loss from gastric bypass in 2018   Dry eye    Family history of adverse reaction to anesthesia    nausea and vomiting   Gastroparesis    GERD (gastroesophageal reflux disease)    H/O shoulder replacement 08/11/2020   left shoulder   History of bariatric surgery 03/2017   History of hiatal hernia    HLD (hyperlipidemia) 03/12/2013   Hyperlipidemia    Hypertension    No HTN meds since weight loss from Gastric Bypass in 2018   Intractable chronic migraine without aura 06/04/2015   Migraine headache    Morbid obesity    OSA (obstructive sleep apnea)    No cpap since gastric surgery   Osteoarthritis    bilateral knee   SBO (small bowel obstruction) 03/19/2019   Sleep apnea     Unspecified hypothyroidism    Vitamin B 12 deficiency    Vitamin D deficiency    Past Surgical History:  Procedure Laterality Date   ANKLE ARTHROSCOPY WITH RECONSTRUCTION Right 09/24/2019   Procedure: ANKLE ARTHROSCOPY DEBRIDEMENT TREATMENT OF OSTEOCHONDRAL LESION TALUS. PRONEAL TENDON DEBRIDEMENT;  Surgeon: Terance Hart, MD;  Location: [facility];  Service: Orthopedics;  Laterality: Right;  SURGERY REQUEST TIME 2 HOURS   APPENDECTOMY  1978   CHOLECYSTECTOMY  2005   COLONOSCOPY     disc repair  05/17/2019   with rods, lumbar spine   GASTRIC ROUX-EN-Y N/A 03/21/2017   Procedure: LAPAROSCOPIC ROUX-EN-Y GASTRIC, UPPER ENDO;  Surgeon: Gaynelle Adu,  MD;  Location: [facility];  Service: General;  Laterality: N/A;   GASTROSTOMY N/A 06/19/2018   Procedure: LAPRASCOPIC INSERTION OF GASTROSTOMY TUBE;  Surgeon: Gaynelle Adu, MD;  Location: [facility];  Service: General;  Laterality: N/A;   GASTROSTOMY TUBE PLACEMENT Left    06/2018   HIATAL HERNIA REPAIR N/A 06/19/2018   Procedure: LAPAROSCOPIC REPAIR OF HIATAL HERNIA;  Surgeon: Gaynelle Adu, MD;  Location: [facility];  Service: General;  Laterality: N/A;   LAPAROSCOPIC LYSIS OF ADHESIONS  03/19/2019   Dr. Phylliss Blakes   LAPAROSCOPY N/A 06/19/2018   Procedure: LAPAROSCOPY DIAGNOSTIC;  Surgeon: Gaynelle Adu, MD;  Location: [facility];  Service: General;  Laterality: N/A;   LAPAROSCOPY N/A 03/19/2019   Procedure: LAPAROSCOPY DIAGNOSTIC lysis of adhesions;  Surgeon: Berna Bue, MD;  Location: [facility];  Service: General;  Laterality: N/A;   LUMBAR LAMINECTOMY/DECOMPRESSION MICRODISCECTOMY Right 10/11/2018   Procedure: LAMINECTOMY AND FORAMINOTOMY RIGHT LUMBAR FOUR- LUMBAR FIVE;  Surgeon: Lisbeth Renshaw, MD;  Location: [facility];  Service: Neurosurgery;  Laterality: Right;   MASS EXCISION Right 09/23/2021   Procedure: EXCISION MASS VOLAR ASPECT RIGHT HAND;  Surgeon: Betha Loa, MD;  Location: [facility];  Service:  Orthopedics;  Laterality: Right;  45 MIN   SHOULDER ARTHROSCOPY  06/2011   left-dsc   TONSILLECTOMY  at age 79   TOTAL SHOULDER ARTHROPLASTY Left 08/11/2020   Procedure: TOTAL SHOULDER ARTHROPLASTY;  Surgeon: Teryl Lucy, MD;  Location: [facility];  Service: Orthopedics;  Laterality: Left;   TRIGGER FINGER RELEASE  12/20/2012   Procedure: RELEASE TRIGGER FINGER/A-1 PULLEY;  Surgeon: Tami Ribas, MD;  Location: [facility];  Service: Orthopedics;  Laterality: Left;  LEFT TRIGGER THUMB RELEASE   TRIGGER FINGER RELEASE Right 09/23/2021   Procedure: RELEASE A-1 PULLEY RIGHT INDEX FINGER;  Surgeon: Betha Loa, MD;  Location: [facility];  Service: Orthopedics;  Laterality: Right;   Family History  Problem Relation Age of Onset   Asthma Father    Allergies Father    Heart disease Father        enlarged heart   Peripheral vascular disease Father    Diabetes Father    Hyperlipidemia Father    Arthritis Father    Asthma Sister    Cancer Sister        colon at 35 yr old.   Colon cancer Sister    Allergies Mother    Stroke Mother 60       with hemi paralysis   Diabetes Mother    Hyperlipidemia Mother    Hypertension Mother    GI problems Mother    Arthritis Mother    Allergies Brother    Early death Brother 38       congenital abormality   Allergies Sister    Diabetes Sister    Asthma Sister    Colon polyps Sister    Hyperlipidemia Sister    GI problems Sister        gastroporesis    Liver disease Sister        fatty liver   Stroke Sister        intercrandial bleed   Diabetes Brother    Hypertension Brother    Hyperlipidemia Brother    Heart disease Paternal Aunt    Migraines Neg Hx    Social History   Socioeconomic History   Marital status: Single    Spouse name: Not on file   Number of children: 0  Years of education: HS   Highest education level: 12th grade  Occupational History   Occupation: disabled    Employer:  UNEMPLOYED  Tobacco Use   Smoking status: Never   Smokeless tobacco: Never  Vaping Use   Vaping Use: Never used  Substance and Sexual Activity   Alcohol use: No   Drug use: No   Sexual activity: Never    Birth control/protection: Post-menopausal    Comment: LMP 10/2017  Other Topics Concern   Not on file  Social History Narrative   Patient is right handed.   Patient drinks 2 glasses of caffeine daily.   Lives at home. Her niece is living with her right now.   Social Determinants of Health   Financial Resource Strain: Low Risk  (03/22/2023)   Overall Financial Resource Strain (CARDIA)    Difficulty of Paying Living Expenses: Not hard at all  Food Insecurity: No Food Insecurity (03/22/2023)   Hunger Vital Sign    Worried About Running Out of Food in the Last Year: Never true    Ran Out of Food in the Last Year: Never true  Transportation Needs: No Transportation Needs (03/22/2023)   PRAPARE - Administrator, Civil Service (Medical): No    Lack of Transportation (Non-Medical): No  Physical Activity: Insufficiently Active (03/22/2023)   Exercise Vital Sign    Days of Exercise per Week: 3 days    Minutes of Exercise per Session: 30 min  Stress: No Stress Concern Present (03/22/2023)   [employer] of Occupational Health - Occupational Stress Questionnaire    Feeling of Stress : Not at all  Social Connections: Moderately Isolated (03/22/2023)   Social Connection and Isolation Panel [NHANES]    Frequency of Communication with Friends and Family: More than three times a week    Frequency of Social Gatherings with Friends and Family: More than three times a week    Attends Religious Services: More than 4 times per year    Active Member of [employer] or Organizations: No    Attends Engineer, structural: Never    Marital Status: Never married    Tobacco Counseling Counseling given: Not Answered   Clinical Intake:  Pre-visit preparation completed: Yes  Pain  : No/denies pain     Nutritional Risks: None Diabetes: Yes CBG done?: No Did pt. bring in CBG monitor from home?: No  How often do you need to have someone help you when you read instructions, pamphlets, or other written materials from your doctor or pharmacy?: 1 - Never  Diabetic?yes  Nutrition Risk Assessment:  Has the patient had any N/V/D within the last 2 months?  No  Does the patient have any non-healing wounds?  No  Has the patient had any unintentional weight loss or weight gain?  No   Diabetes:  Is the patient diabetic?  Yes  If diabetic, was a CBG obtained today?  No  Did the patient bring in their glucometer from home?  No  How often do you monitor your CBG's? 4 x day .   Financial Strains and Diabetes Management:  Are you having any financial strains with the device, your supplies or your medication? No .  Does the patient want to be seen by Chronic Care Management for management of their diabetes?  No  Would the patient like to be referred to a Nutritionist or for Diabetic Management?  No   Diabetic Exams:  Diabetic Eye Exam: Completed 06/2022 Diabetic Foot Exam: Overdue,  Pt has been advised about the importance in completing this exam. Pt is scheduled for diabetic foot exam on next office visit .   Interpreter Needed?: No  Information entered by :: Renie OraLaura Wilson, LPN   Activities of Daily Living    03/22/2023    9:55 AM 08/31/2022    8:24 AM  In your present state of health, do you have any difficulty performing the following activities:  Hearing? 0   Vision? 0   Difficulty concentrating or making decisions? 0   Walking or climbing stairs? 0   Dressing or bathing? 0   Doing errands, shopping? 0 0  Preparing Food and eating ? N   Using the Toilet? N   In the past six months, have you accidently leaked urine? N   Do you have problems with loss of bowel control? N   Managing your Medications? N   Managing your Finances? N   Housekeeping or managing  your Housekeeping? N     Patient Care Team: Raliegh IpGottschalk, Ashly M, DO as PCP - General (Family Medicine) Chilton Siandolph, Tiffany, MD as PCP - Cardiology (Cardiology) Philip Aspenallahan, Sidney, DO as Attending Physician (Obstetrics and Gynecology) Teryl LucyLandau, Joshua, MD as Attending Physician (Orthopedic Surgery) Mixon, Kateri McHenry T as Consulting Physician (Unknown Physician Specialty) Michaelle CopasLe, Yen Thi Hong, MD as Referring Physician (Optometry) Waymon BudgeYoung, Clinton D, MD as Consulting Physician (Pulmonary Disease) Lunette StandsVoytek, Anna, MD as Consulting Physician (Orthopedic Surgery) Gaynelle AduWilson, Eric, MD as Consulting Physician (General Surgery) Lisbeth RenshawNundkumar, Neelesh, MD as Consulting Physician (Neurosurgery) Rowell, Benedetto CoonsJennifer Voigt, MD as Referring Physician (Endocrinology) Shawnie DapperLomax, Amy, NP as Nurse Practitioner (Neurology)  Indicate any recent Medical Services you may have received from other than [facility] providers in the past year (date may be approximate).     Assessment:   This is a routine wellness examination for Clydie BraunKaren.  Hearing/Vision screen Vision Screening - Comments:: Wears rx glasses - up to date with routine eye exams with  Dr.Lee   Dietary issues and exercise activities discussed: Current Exercise Habits: Home exercise routine, Type of exercise: walking, Time (Minutes): 30, Frequency (Times/Week): 3, Weekly Exercise (Minutes/Week): 90, Exercise limited by: orthopedic condition(s)   Goals Addressed             This Visit's Progress    Patient Stated   On track    03/14/2022 AWV Goal: Exercise for General Health  Patient will verbalize understanding of the benefits of increased physical activity: Exercising regularly is important. It will improve your overall fitness, flexibility, and endurance. Regular exercise also will improve your overall health. It can help you control your weight, reduce stress, and improve your bone density. Over the next year, patient will increase physical activity as tolerated with a goal of at  least 150 minutes of moderate physical activity per week.  You can tell that you are exercising at a moderate intensity if your heart starts beating faster and you start breathing faster but can still hold a conversation. Moderate-intensity exercise ideas include: Walking 1 mile (1.6 km) in about 15 minutes Biking Hiking Golfing Dancing Water aerobics Patient will verbalize understanding of everyday activities that increase physical activity by providing examples like the following: Yard work, such as: Insurance underwriterushing a lawn mower Raking and bagging leaves Washing your car Pushing a stroller Shoveling snow Gardening Washing windows or floors Patient will be able to explain general safety guidelines for exercising:  Before you start a new exercise program, talk with your health care provider. Do not exercise so much that you hurt  yourself, feel dizzy, or get very short of breath. Wear comfortable clothes and wear shoes with good support. Drink plenty of water while you exercise to prevent dehydration or heat stroke. Work out until your breathing and your heartbeat get faster.        Depression Screen    03/22/2023    9:54 AM 03/06/2023   10:42 AM 02/20/2023   11:07 AM 01/27/2023    8:09 AM 07/26/2022    8:39 AM 03/14/2022    9:58 AM 01/26/2022    7:57 AM  PHQ 2/9 Scores  PHQ - 2 Score 0 6 6 4 4 2 4   PHQ- 9 Score 0 17 12 13 14 3 14     Fall Risk    03/22/2023    9:53 AM 07/26/2022    8:39 AM 03/14/2022    9:53 AM 01/26/2022    7:57 AM 07/27/2021    8:00 AM  Fall Risk   Falls in the past year? 0 0 0 0 0  Number falls in past yr: 0  0    Injury with Fall? 0  0    Risk for fall due to : No Fall Risks  Impaired balance/gait;Orthopedic patient;Medication side effect    Follow up Falls prevention discussed  Falls prevention discussed      FALL RISK PREVENTION PERTAINING TO THE HOME:  Any stairs in or around the home? No  If so, are there any without handrails? No  Home free of loose  throw rugs in walkways, pet beds, electrical cords, etc? Yes  Adequate lighting in your home to reduce risk of falls? Yes   ASSISTIVE DEVICES UTILIZED TO PREVENT FALLS:  Life alert? No  Use of a cane, walker or w/c? Yes  Grab bars in the bathroom? Yes  Shower chair or bench in shower? Yes  Elevated toilet seat or a handicapped toilet? Yes       07/19/2018    9:21 AM 07/10/2017    9:00 AM 06/29/2016    9:19 AM  MMSE - Mini Mental State Exam  Orientation to time 5 5 5   Orientation to Place 5 5 5   Registration 3 3 3   Attention/ Calculation 5 5 5   Recall 3 3 3   Language- name 2 objects 2 2 2   Language- repeat 1 1 1   Language- follow 3 step command 3 3 3   Language- read & follow direction 1 1 1   Write a sentence 1 1 1   Copy design 1 1 1   Total score 30 30 30         03/22/2023    9:56 AM 03/14/2022   10:06 AM 03/10/2021    9:58 AM 03/05/2020    9:46 AM  6CIT Screen  What Year? 0 points 0 points 0 points 0 points  What month? 0 points 0 points 0 points 0 points  What time? 0 points 0 points 0 points 0 points  Count back from 20 0 points 0 points 0 points 0 points  Months in reverse 0 points 0 points 0 points 0 points  Repeat phrase 0 points 4 points 0 points 0 points  Total Score 0 points 4 points 0 points 0 points    Immunizations Immunization History  Administered Date(s) Administered   Covid-19, Mrna,Vaccine(Spikevax)83yrs and older 12/14/2022   Influenza Inj Mdck Quad With Preservative 09/11/2017   Influenza Split 09/11/2020   Influenza,inj,Quad PF,6+ Mos 09/29/2016, 09/07/2017, 09/11/2018, 09/01/2021, 09/05/2022   Influenza,inj,quad, With Preservative 09/17/2019   Influenza-Unspecified 09/11/2016  Moderna Covid-19 Vaccine Bivalent Booster 66yrs & up 11/23/2021   Moderna Sars-Covid-2 Vaccination 03/02/2020, 03/30/2020, 10/14/2020   PNEUMOCOCCAL CONJUGATE-20 01/26/2022   Pneumococcal Polysaccharide-23 06/29/2016   Td 07/09/2015   Tdap 07/09/2015   Zoster Recombinat  (Shingrix) 10/06/2017, 01/04/2018    TDAP status: Up to date  Flu Vaccine status: Up to date  Pneumococcal vaccine status: Up to date  Covid-19 vaccine status: Completed vaccines  Qualifies for Shingles Vaccine? Yes   Zostavax completed Yes   Shingrix Completed?: Yes  Screening Tests Health Maintenance  Topic Date Due   COVID-19 Vaccine (6 - 2023-24 season) 02/08/2023   OPHTHALMOLOGY EXAM  06/24/2023   INFLUENZA VACCINE  07/13/2023   HEMOGLOBIN A1C  07/28/2023   PAP SMEAR-Modifier  11/24/2023   MAMMOGRAM  12/24/2023   Diabetic kidney evaluation - eGFR measurement  01/28/2024   Diabetic kidney evaluation - Urine ACR  01/28/2024   FOOT EXAM  01/28/2024   Medicare Annual Wellness (AWV)  03/21/2024   DTaP/Tdap/Td (3 - Td or Tdap) 07/08/2025   COLONOSCOPY (Pts 45-15yrs Insurance coverage will need to be confirmed)  12/15/2025   Hepatitis C Screening  Completed   HIV Screening  Completed   Zoster Vaccines- Shingrix  Completed   HPV VACCINES  Aged Out    Health Maintenance  Health Maintenance Due  Topic Date Due   COVID-19 Vaccine (6 - 2023-24 season) 02/08/2023    Colorectal cancer screening: Type of screening: Colonoscopy. Completed 12/15/2020. Repeat every 5 years  Mammogram status: Completed 12/23/2022. Repeat every year  Bone Density status: Ordered not of age . Pt provided with contact info and advised to call to schedule appt.  Lung Cancer Screening: (Low Dose CT Chest recommended if Age 58-80 years, 30 pack-year currently smoking OR have quit w/in 15years.) does not qualify.   Lung Cancer Screening Referral: n/a  Additional Screening:  Hepatitis C Screening: does not qualify; Completed 05/25/2020  Vision Screening: Recommended annual ophthalmology exams for early detection of glaucoma and other disorders of the eye. Is the patient up to date with their annual eye exam?  Yes  Who is the provider or what is the name of the office in which the patient attends  annual eye exams? Dr.Lee  If pt is not established with a provider, would they like to be referred to a provider to establish care? No .   Dental Screening: Recommended annual dental exams for proper oral hygiene  Community Resource Referral / Chronic Care Management: CRR required this visit?  No   CCM required this visit?  No      Plan:     I have personally reviewed and noted the following in the patient's chart:   Medical and social history Use of alcohol, tobacco or illicit drugs  Current medications and supplements including opioid prescriptions. Patient is not currently taking opioid prescriptions. Functional ability and status Nutritional status Physical activity Advanced directives List of other physicians Hospitalizations, surgeries, and ER visits in previous 12 months Vitals Screenings to include cognitive, depression, and falls Referrals and appointments  In addition, I have reviewed and discussed with patient certain preventive protocols, quality metrics, and best practice recommendations. A written personalized care plan for preventive services as well as general preventive health recommendations were provided to patient.     Lorrene Reid, LPN   [id number]/61/0960   Nurse Notes: none

## 2023-03-22 NOTE — Patient Instructions (Signed)
Morgan [name] , Thank you for taking time to come for your Medicare Wellness Visit. I appreciate your ongoing commitment to your health goals. Please review the following plan we discussed and let me know if I can assist you in the future.   These are the goals we discussed:  Goals      Have 3 meals a day     03/14/2022 AWV Goal: Improved Nutrition/Diet  Patient will verbalize understanding that diet plays an important role in overall health and that a poor diet is a risk factor for many chronic medical conditions.  Over the next year, patient will improve self management of their diet by incorporating improved meal pattern. Patient will utilize available community resources to help with food acquisition if needed (ex: food pantries, [address], etc) Patient will work with nutrition specialist if a referral was made      Patient Stated     03/14/2022 AWV Goal: Exercise for General Health  Patient will verbalize understanding of the benefits of increased physical activity: Exercising regularly is important. It will improve your overall fitness, flexibility, and endurance. Regular exercise also will improve your overall health. It can help you control your weight, reduce stress, and improve your bone density. Over the next year, patient will increase physical activity as tolerated with a goal of at least 150 minutes of moderate physical activity per week.  You can tell that you are exercising at a moderate intensity if your heart starts beating faster and you start breathing faster but can still hold a conversation. Moderate-intensity exercise ideas include: Walking 1 mile (1.6 km) in about 15 minutes Biking Hiking Golfing Dancing Water aerobics Patient will verbalize understanding of everyday activities that increase physical activity by providing examples like the following: Yard work, such as: Barista Gardening Washing windows or floors Patient will be able to explain general safety guidelines for exercising:  Before you start a new exercise program, talk with your health care provider. Do not exercise so much that you hurt yourself, feel dizzy, or get very short of breath. Wear comfortable clothes and wear shoes with good support. Drink plenty of water while you exercise to prevent dehydration or heat stroke. Work out until your breathing and your heartbeat get faster.         This is a list of the screening recommended for you and due dates:  Health Maintenance  Topic Date Due   COVID-19 Vaccine (6 - 2023-24 season) 02/08/2023   Eye exam for diabetics  06/24/2023   Flu Shot  07/13/2023   Hemoglobin A1C  07/28/2023   Pap Smear  11/24/2023   Mammogram  12/24/2023   Yearly kidney function blood test for diabetes  01/28/2024   Yearly kidney health urinalysis for diabetes  01/28/2024   Complete foot exam   01/28/2024   Medicare Annual Wellness Visit  03/21/2024   DTaP/Tdap/Td vaccine (3 - Td or Tdap) 07/08/2025   Colon Cancer Screening  12/15/2025   Hepatitis C Screening: USPSTF Recommendation to screen - Ages 49-79 yo.  Completed   HIV Screening  Completed   Zoster (Shingles) Vaccine  Completed   HPV Vaccine  Aged Out    Advanced directives: In Chart   Conditions/risks identified: Aim for 30 minutes of exercise or brisk walking, 6-8 glasses of water, and 5 servings of fruits and vegetables each day.   Next appointment: Follow up in  one year for your annual wellness visit.   Preventive Care 40-64 Years, Female Preventive care refers to lifestyle choices and visits with your health care provider that can promote health and wellness. What does preventive care include? A yearly physical exam. This is also called an annual well check. Dental exams once or twice a year. Routine eye exams. Ask your health care provider how often you should have your eyes checked. Personal  lifestyle choices, including: Daily care of your teeth and gums. Regular physical activity. Eating a healthy diet. Avoiding tobacco and drug use. Limiting alcohol use. Practicing safe sex. Taking low-dose aspirin daily starting at age 56. Taking vitamin and mineral supplements as recommended by your health care provider. What happens during an annual well check? The services and screenings done by your health care provider during your annual well check will depend on your age, overall health, lifestyle risk factors, and family history of disease. Counseling  Your health care provider may ask you questions about your: Alcohol use. Tobacco use. Drug use. Emotional well-being. Home and relationship well-being. Sexual activity. Eating habits. Work and work Astronomerenvironment. Method of birth control. Menstrual cycle. Pregnancy history. Screening  You may have the following tests or measurements: Height, weight, and BMI. Blood pressure. Lipid and cholesterol levels. These may be checked every 5 years, or more frequently if you are over 56 years old. Skin check. Lung cancer screening. You may have this screening every year starting at age 56 if you have a 30-pack-year history of smoking and currently smoke or have quit within the past 15 years. Fecal occult blood test (FOBT) of the stool. You may have this test every year starting at age 56. Flexible sigmoidoscopy or colonoscopy. You may have a sigmoidoscopy every 5 years or a colonoscopy every 10 years starting at age 56. Hepatitis C blood test. Hepatitis B blood test. Sexually transmitted disease (STD) testing. Diabetes screening. This is done by checking your blood sugar (glucose) after you have not eaten for a while (fasting). You may have this done every 1-3 years. Mammogram. This may be done every 1-2 years. Talk to your health care provider about when you should start having regular mammograms. This may depend on whether you have a  family history of breast cancer. BRCA-related cancer screening. This may be done if you have a family history of breast, ovarian, tubal, or peritoneal cancers. Pelvic exam and Pap test. This may be done every 3 years starting at age 56. Starting at age 56, this may be done every 5 years if you have a Pap test in combination with an HPV test. Bone density scan. This is done to screen for osteoporosis. You may have this scan if you are at high risk for osteoporosis. Discuss your test results, treatment options, and if necessary, the need for more tests with your health care provider. Vaccines  Your health care provider may recommend certain vaccines, such as: Influenza vaccine. This is recommended every year. Tetanus, diphtheria, and acellular pertussis (Tdap, Td) vaccine. You may need a Td booster every 10 years. Zoster vaccine. You may need this after age 56. Pneumococcal 13-valent conjugate (PCV13) vaccine. You may need this if you have certain conditions and were not previously vaccinated. Pneumococcal polysaccharide (PPSV23) vaccine. You may need one or two doses if you smoke cigarettes or if you have certain conditions. Talk to your health care provider about which screenings and vaccines you need and how often you need them. This information is not intended  to replace advice given to you by your health care provider. Make sure you discuss any questions you have with your health care provider. Document Released: 12/25/2015 Document Revised: 08/17/2016 Document Reviewed: 09/29/2015 Elsevier Interactive Patient Education  2017 [employer].    Fall Prevention in the Home Falls can cause injuries. They can happen to people of all ages. There are many things you can do to make your home safe and to help prevent falls. What can I do on the outside of my home? Regularly fix the edges of walkways and driveways and fix any cracks. Remove anything that might make you trip as you walk through a  door, such as a raised step or threshold. Trim any bushes or trees on the path to your home. Use bright outdoor lighting. Clear any walking paths of anything that might make someone trip, such as rocks or tools. Regularly check to see if handrails are loose or broken. Make sure that both sides of any steps have handrails. Any raised decks and porches should have guardrails on the edges. Have any leaves, snow, or ice cleared regularly. Use sand or salt on walking paths during winter. Clean up any spills in your garage right away. This includes oil or grease spills. What can I do in the bathroom? Use night lights. Install grab bars by the toilet and in the tub and shower. Do not use towel bars as grab bars. Use non-skid mats or decals in the tub or shower. If you need to sit down in the shower, use a plastic, non-slip stool. Keep the floor dry. Clean up any water that spills on the floor as soon as it happens. Remove soap buildup in the tub or shower regularly. Attach bath mats securely with double-sided non-slip rug tape. Do not have throw rugs and other things on the floor that can make you trip. What can I do in the bedroom? Use night lights. Make sure that you have a light by your bed that is easy to reach. Do not use any sheets or blankets that are too big for your bed. They should not hang down onto the floor. Have a firm chair that has side arms. You can use this for support while you get dressed. Do not have throw rugs and other things on the floor that can make you trip. What can I do in the kitchen? Clean up any spills right away. Avoid walking on wet floors. Keep items that you use a lot in easy-to-reach places. If you need to reach something above you, use a strong step stool that has a grab bar. Keep electrical cords out of the way. Do not use floor polish or wax that makes floors slippery. If you must use wax, use non-skid floor wax. Do not have throw rugs and other things  on the floor that can make you trip. What can I do with my stairs? Do not leave any items on the stairs. Make sure that there are handrails on both sides of the stairs and use them. Fix handrails that are broken or loose. Make sure that handrails are as long as the stairways. Check any carpeting to make sure that it is firmly attached to the stairs. Fix any carpet that is loose or worn. Avoid having throw rugs at the top or bottom of the stairs. If you do have throw rugs, attach them to the floor with carpet tape. Make sure that you have a light switch at the top of the  stairs and the bottom of the stairs. If you do not have them, ask someone to add them for you. What else can I do to help prevent falls? Wear shoes that: Do not have high heels. Have rubber bottoms. Are comfortable and fit you well. Are closed at the toe. Do not wear sandals. If you use a stepladder: Make sure that it is fully opened. Do not climb a closed stepladder. Make sure that both sides of the stepladder are locked into place. Ask someone to hold it for you, if possible. Clearly mark and make sure that you can see: Any grab bars or handrails. First and last steps. Where the edge of each step is. Use tools that help you move around (mobility aids) if they are needed. These include: Canes. Walkers. Scooters. Crutches. Turn on the lights when you go into a dark area. Replace any light bulbs as soon as they burn out. Set up your furniture so you have a clear path. Avoid moving your furniture around. If any of your floors are uneven, fix them. If there are any pets around you, be aware of where they are. Review your medicines with your doctor. Some medicines can make you feel dizzy. This can increase your chance of falling. Ask your doctor what other things that you can do to help prevent falls. This information is not intended to replace advice given to you by your health care provider. Make sure you discuss any  questions you have with your health care provider. Document Released: 09/24/2009 Document Revised: 05/05/2016 Document Reviewed: 01/02/2015 Elsevier Interactive Patient Education  2017 [employer].

## 2023-03-23 ENCOUNTER — Ambulatory Visit: Payer: Medicare Other | Admitting: *Deleted

## 2023-03-23 ENCOUNTER — Encounter: Payer: Self-pay | Admitting: *Deleted

## 2023-03-23 DIAGNOSIS — M5459 Other low back pain: Secondary | ICD-10-CM | POA: Diagnosis not present

## 2023-03-23 DIAGNOSIS — M546 Pain in thoracic spine: Secondary | ICD-10-CM

## 2023-03-23 DIAGNOSIS — M62830 Muscle spasm of back: Secondary | ICD-10-CM

## 2023-03-23 NOTE — Therapy (Signed)
OUTPATIENT PHYSICAL THERAPY THORACOLUMBAR TREATMENT   Patient Name: Morgan [name] MRN: [id number] DOB:1966-09-21, 57 y.o., female Today's Date: 03/23/2023  END OF SESSION:  PT End of Session - 03/23/23 0805     Visit Number 4    Number of Visits 12    Date for PT Re-Evaluation 04/26/23    Authorization Type FOTO.    PT Start Time 0815    PT Stop Time 0905    PT Time Calculation (min) 50 min             Past Medical History:  Diagnosis Date   Allergic rhinitis    Anemia     after gastric bypass in 2018   Anxiety    Barrett's esophagus    Bipolar affective disorder    CAP (community acquired pneumonia) 07/20/2018   Chronic respiratory failure    Constipation, chronic    Degenerative joint disease of spine    Depression    Diabetes mellitus without complication    type 2    DM (diabetes mellitus)    "hypoglycemic" per pt - no longer takes DM meds due to weight loss from gastric bypass in 2018   Dry eye    Family history of adverse reaction to anesthesia    nausea and vomiting   Gastroparesis    GERD (gastroesophageal reflux disease)    H/O shoulder replacement 08/11/2020   left shoulder   History of bariatric surgery 03/2017   History of hiatal hernia    HLD (hyperlipidemia) 03/12/2013   Hyperlipidemia    Hypertension    No HTN meds since weight loss from Gastric Bypass in 2018   Intractable chronic migraine without aura 06/04/2015   Migraine headache    Morbid obesity    OSA (obstructive sleep apnea)    No cpap since gastric surgery   Osteoarthritis    bilateral knee   SBO (small bowel obstruction) 03/19/2019   Sleep apnea    Unspecified hypothyroidism    Vitamin B 12 deficiency    Vitamin D deficiency    Past Surgical History:  Procedure Laterality Date   ANKLE ARTHROSCOPY WITH RECONSTRUCTION Right 09/24/2019   Procedure: ANKLE ARTHROSCOPY DEBRIDEMENT TREATMENT OF OSTEOCHONDRAL LESION TALUS. PRONEAL TENDON DEBRIDEMENT;  Surgeon: Terance Hart,  MD;  Location: [facility];  Service: Orthopedics;  Laterality: Right;  SURGERY REQUEST TIME 2 HOURS   APPENDECTOMY  1978   CHOLECYSTECTOMY  2005   COLONOSCOPY     disc repair  05/17/2019   with rods, lumbar spine   GASTRIC ROUX-EN-Y N/A 03/21/2017   Procedure: LAPAROSCOPIC ROUX-EN-Y GASTRIC, UPPER ENDO;  Surgeon: Gaynelle Adu, MD;  Location: [facility];  Service: General;  Laterality: N/A;   GASTROSTOMY N/A 06/19/2018   Procedure: LAPRASCOPIC INSERTION OF GASTROSTOMY TUBE;  Surgeon: Gaynelle Adu, MD;  Location: [facility];  Service: General;  Laterality: N/A;   GASTROSTOMY TUBE PLACEMENT Left    06/2018   HIATAL HERNIA REPAIR N/A 06/19/2018   Procedure: LAPAROSCOPIC REPAIR OF HIATAL HERNIA;  Surgeon: Gaynelle Adu, MD;  Location: [facility];  Service: General;  Laterality: N/A;   LAPAROSCOPIC LYSIS OF ADHESIONS  03/19/2019   Dr. Phylliss Blakes   LAPAROSCOPY N/A 06/19/2018   Procedure: LAPAROSCOPY DIAGNOSTIC;  Surgeon: Gaynelle Adu, MD;  Location: [facility];  Service: General;  Laterality: N/A;   LAPAROSCOPY N/A 03/19/2019   Procedure: LAPAROSCOPY DIAGNOSTIC lysis of adhesions;  Surgeon: Berna Bue, MD;  Location: [facility];  Service: General;  Laterality: N/A;  LUMBAR LAMINECTOMY/DECOMPRESSION MICRODISCECTOMY Right 10/11/2018   Procedure: LAMINECTOMY AND FORAMINOTOMY RIGHT LUMBAR FOUR- LUMBAR FIVE;  Surgeon: Lisbeth Renshaw, MD;  Location: [facility];  Service: Neurosurgery;  Laterality: Right;   MASS EXCISION Right 09/23/2021   Procedure: EXCISION MASS VOLAR ASPECT RIGHT HAND;  Surgeon: Betha Loa, MD;  Location: [facility];  Service: Orthopedics;  Laterality: Right;  45 MIN   SHOULDER ARTHROSCOPY  06/2011   left-dsc   TONSILLECTOMY  at age 41   TOTAL SHOULDER ARTHROPLASTY Left 08/11/2020   Procedure: TOTAL SHOULDER ARTHROPLASTY;  Surgeon: Teryl Lucy, MD;  Location: [facility];  Service: Orthopedics;  Laterality: Left;   TRIGGER FINGER RELEASE   12/20/2012   Procedure: RELEASE TRIGGER FINGER/A-1 PULLEY;  Surgeon: Tami Ribas, MD;  Location: [facility];  Service: Orthopedics;  Laterality: Left;  LEFT TRIGGER THUMB RELEASE   TRIGGER FINGER RELEASE Right 09/23/2021   Procedure: RELEASE A-1 PULLEY RIGHT INDEX FINGER;  Surgeon: Betha Loa, MD;  Location: [facility];  Service: Orthopedics;  Laterality: Right;   Patient Active Problem List   Diagnosis Date Noted   Spondylolysis of lumbar region 09/09/2022   Osteoarthritis of left shoulder 08/11/2020   S/P shoulder replacement, left 08/11/2020   AMS (altered mental status) 05/19/2019   SBO (small bowel obstruction) s/p lap LOA 03/19/2019 03/19/2019   Reactive hypoglycemia 02/12/2019   Chronic migraine without aura, with intractable migraine, so stated, with status migrainosus 11/20/2018   Lumbar radiculopathy 10/11/2018   Atelectasis    Hypoglycemia    Hypotension    Bradycardia    Epigastric pain 06/19/2018   History of adenomatous polyp of colon 10/23/2017   Hyperlipidemia associated with type 2 diabetes mellitus 07/06/2017   GERD (gastroesophageal reflux disease) 03/21/2017   History of Roux-en-Y gastric bypass 2018 03/21/2017   Intractable chronic migraine without aura 06/04/2015   Abnormal uterine bleeding 05/13/2015   Pancreatitis 02/27/2014   Fatty liver disease, nonalcoholic 02/25/2014   Metabolic syndrome 02/20/2014   Vitamin D deficiency    DM (diabetes mellitus) 03/12/2013   Surgery, elective 06/12/2011   Hypothyroidism    Constipation, chronic    Vitamin B 12 deficiency    ALLERGIC RHINITIS 12/31/2010   BARRETTS ESOPHAGUS 12/31/2010   Gastroparesis 12/31/2010   Bilateral chronic knee pain 12/31/2010   Obstructive sleep apnea 09/27/2010   Migraine 09/27/2010   Cyst of ovary 10/19/2009     REFERRING PROVIDER: Delynn Flavin DO.  REFERRING DIAG: Spasm of Thoracic muscle.  Rationale for Evaluation and Treatment:  Rehabilitation  THERAPY DIAG:  Pain in thoracic spine  Muscle spasm of back  Other low back pain  ONSET DATE: January 2024.  SUBJECTIVE:  SUBJECTIVE STATEMENT: Pain at a 5/10 on the left side mid-back today.  PERTINENT HISTORY:  Lumbar fusion (08/2022), left shoulder surgery.  PAIN:  Are you having pain? Yes: NPRS scale: 5/10 Pain location: Mid-back. Pain description: Ache, throb, sharp. Aggravating factors: As above. Relieving factors: As above.  PRECAUTIONS: None  WEIGHT BEARING RESTRICTIONS: No  FALLS:  Has patient fallen in last 6 months? No  LIVING ENVIRONMENT: Lives in: House/apartment   PLOF: Independent  PATIENT GOALS: Return to swimming and use UE's without mid-back pain.    OBJECTIVE:    PATIENT SURVEYS:  FOTO . 39.   POSTURE: rounded shoulders and forward head  PALPATION: Tender to palpation over left thoracic musculature near the medial scapular border attachment and to a lesser degree on the right.  UE ROM:   Full active bilateral UE AROM.    LOWER EXTREMITY MMT:    Left mid-trap/Rhomboid strength 4/5 per contralateral comparison.  DTR's:  UE DTR's are normal.   TODAY'S TREATMENT:                                                                                                                              DATE:                                                                03-23-23  UBE x 5 mins 120 RPM's Combo e'stim/US at 1.50 W/CM2 x 12 minutes to affected left scapular region RT side lying  STW/M x 10 minutes to same area   HMP and IFC at 80-150 Hz on 40% scan x 15 minutes to LT   Thoracic/scapular musculature.  Patient tolerated treatment without complaint with normal modality response following removal of modality.  ASSESSMENT:  CLINICAL  IMPRESSION: Patient with most pain in left thoracic region , but less today 5/10 today . Rx focused on LT side thoracic paras as well as rhomboids again. Pt was able to perform  5 mins on the UBE today and did well. Palpably tender with good response to US combo and  STW/M.  Patient tolerated treatment well today with decreased pain end of session.  OBJECTIVE IMPAIRMENTS: decreased activity tolerance, decreased strength, increased muscle spasms, postural dysfunction, and pain.   ACTIVITY LIMITATIONS: carrying, lifting, bending, sitting, standing, sleeping, and reach over head  PARTICIPATION LIMITATIONS: meal prep, cleaning, and laundry  REHAB POTENTIAL: Excellent  CLINICAL DECISION MAKING: Stable/uncomplicated  EVALUATION COMPLEXITY: Low   GOALS:  SHORT TERM GOALS: Target date: 03/29/23  Ind with a HEP. Goal status: INITIAL   LONG TERM GOALS: Target date: 04/26/23  Perform ADL's with pain not > 2-3/10.  Goal status: INITIAL  2.  Sit 30 minutes with pain not > 2-3/10.  Goal status: INITIAL  3.  Return to swimming with pain not > 2-3/10.  Goal status: INITIAL PLAN:  PT FREQUENCY: 2x/week  PT DURATION: 6 weeks  PLANNED INTERVENTIONS: Therapeutic exercises, Therapeutic activity, Patient/Family education, Self Care, Electrical stimulation, Cryotherapy, Moist heat, Ultrasound, and Manual therapy.  PLAN FOR NEXT SESSION: Combo e'stim/US, STW/M, scapular strengthening and postural exercises.   Jinna Weinman,CHRIS, PTA 03/23/2023, 9:10 AM

## 2023-03-27 ENCOUNTER — Encounter: Payer: Self-pay | Admitting: *Deleted

## 2023-03-27 ENCOUNTER — Ambulatory Visit: Payer: Medicare Other | Admitting: *Deleted

## 2023-03-27 DIAGNOSIS — M62830 Muscle spasm of back: Secondary | ICD-10-CM

## 2023-03-27 DIAGNOSIS — M5459 Other low back pain: Secondary | ICD-10-CM | POA: Diagnosis not present

## 2023-03-27 DIAGNOSIS — M546 Pain in thoracic spine: Secondary | ICD-10-CM

## 2023-03-27 NOTE — Therapy (Signed)
OUTPATIENT PHYSICAL THERAPY THORACOLUMBAR TREATMENT   Patient Name: Morgan [name] MRN: [id number] DOB:1966/05/11, 56 y.o., female Today's Date: 03/27/2023  END OF SESSION:  PT End of Session - 03/27/23 0803     Visit Number 5    Number of Visits 12    Date for PT Re-Evaluation 04/26/23    Authorization Type FOTO.    PT Start Time 0815    PT Stop Time 0905    PT Time Calculation (min) 50 min             Past Medical History:  Diagnosis Date   Allergic rhinitis    Anemia     after gastric bypass in 2018   Anxiety    Barrett's esophagus    Bipolar affective disorder    CAP (community acquired pneumonia) 07/20/2018   Chronic respiratory failure    Constipation, chronic    Degenerative joint disease of spine    Depression    Diabetes mellitus without complication    type 2    DM (diabetes mellitus)    "hypoglycemic" per pt - no longer takes DM meds due to weight loss from gastric bypass in 2018   Dry eye    Family history of adverse reaction to anesthesia    nausea and vomiting   Gastroparesis    GERD (gastroesophageal reflux disease)    H/O shoulder replacement 08/11/2020   left shoulder   History of bariatric surgery 03/2017   History of hiatal hernia    HLD (hyperlipidemia) 03/12/2013   Hyperlipidemia    Hypertension    No HTN meds since weight loss from Gastric Bypass in 2018   Intractable chronic migraine without aura 06/04/2015   Migraine headache    Morbid obesity    OSA (obstructive sleep apnea)    No cpap since gastric surgery   Osteoarthritis    bilateral knee   SBO (small bowel obstruction) 03/19/2019   Sleep apnea    Unspecified hypothyroidism    Vitamin B 12 deficiency    Vitamin D deficiency    Past Surgical History:  Procedure Laterality Date   ANKLE ARTHROSCOPY WITH RECONSTRUCTION Right 09/24/2019   Procedure: ANKLE ARTHROSCOPY DEBRIDEMENT TREATMENT OF OSTEOCHONDRAL LESION TALUS. PRONEAL TENDON DEBRIDEMENT;  Surgeon: Terance Hart,  MD;  Location:  [facility];  Service: Orthopedics;  Laterality: Right;  SURGERY REQUEST TIME 2 HOURS   APPENDECTOMY  1978   CHOLECYSTECTOMY  2005   COLONOSCOPY     disc repair  05/17/2019   with rods, lumbar spine   GASTRIC ROUX-EN-Y N/A 03/21/2017   Procedure: LAPAROSCOPIC ROUX-EN-Y GASTRIC, UPPER ENDO;  Surgeon: Gaynelle Adu, MD;  Location: [facility];  Service: General;  Laterality: N/A;   GASTROSTOMY N/A 06/19/2018   Procedure: LAPRASCOPIC INSERTION OF GASTROSTOMY TUBE;  Surgeon: Gaynelle Adu, MD;  Location: [facility];  Service: General;  Laterality: N/A;   GASTROSTOMY TUBE PLACEMENT Left    06/2018   HIATAL HERNIA REPAIR N/A 06/19/2018   Procedure: LAPAROSCOPIC REPAIR OF HIATAL HERNIA;  Surgeon: Gaynelle Adu, MD;  Location: [facility];  Service: General;  Laterality: N/A;   LAPAROSCOPIC LYSIS OF ADHESIONS  03/19/2019   Dr. Phylliss Blakes   LAPAROSCOPY N/A 06/19/2018   Procedure: LAPAROSCOPY DIAGNOSTIC;  Surgeon: Gaynelle Adu, MD;  Location: [facility];  Service: General;  Laterality: N/A;   LAPAROSCOPY N/A 03/19/2019   Procedure: LAPAROSCOPY DIAGNOSTIC lysis of adhesions;  Surgeon: Berna Bue, MD;  Location: [facility];  Service: General;  Laterality: N/A;  LUMBAR LAMINECTOMY/DECOMPRESSION MICRODISCECTOMY Right 10/11/2018   Procedure: LAMINECTOMY AND FORAMINOTOMY RIGHT LUMBAR FOUR- LUMBAR FIVE;  Surgeon: Lisbeth Renshaw, MD;  Location: [facility];  Service: Neurosurgery;  Laterality: Right;   MASS EXCISION Right 09/23/2021   Procedure: EXCISION MASS VOLAR ASPECT RIGHT HAND;  Surgeon: Betha Loa, MD;  Location: [facility];  Service: Orthopedics;  Laterality: Right;  45 MIN   SHOULDER ARTHROSCOPY  06/2011   left-dsc   TONSILLECTOMY  at age 40   TOTAL SHOULDER ARTHROPLASTY Left 08/11/2020   Procedure: TOTAL SHOULDER ARTHROPLASTY;  Surgeon: Teryl Lucy, MD;  Location: [facility];  Service: Orthopedics;  Laterality: Left;   TRIGGER FINGER RELEASE   12/20/2012   Procedure: RELEASE TRIGGER FINGER/A-1 PULLEY;  Surgeon: Tami Ribas, MD;  Location: [facility];  Service: Orthopedics;  Laterality: Left;  LEFT TRIGGER THUMB RELEASE   TRIGGER FINGER RELEASE Right 09/23/2021   Procedure: RELEASE A-1 PULLEY RIGHT INDEX FINGER;  Surgeon: Betha Loa, MD;  Location: [facility];  Service: Orthopedics;  Laterality: Right;   Patient Active Problem List   Diagnosis Date Noted   Spondylolysis of lumbar region 09/09/2022   Osteoarthritis of left shoulder 08/11/2020   S/P shoulder replacement, left 08/11/2020   AMS (altered mental status) 05/19/2019   SBO (small bowel obstruction) s/p lap LOA 03/19/2019 03/19/2019   Reactive hypoglycemia 02/12/2019   Chronic migraine without aura, with intractable migraine, so stated, with status migrainosus 11/20/2018   Lumbar radiculopathy 10/11/2018   Atelectasis    Hypoglycemia    Hypotension    Bradycardia    Epigastric pain 06/19/2018   History of adenomatous polyp of colon 10/23/2017   Hyperlipidemia associated with type 2 diabetes mellitus 07/06/2017   GERD (gastroesophageal reflux disease) 03/21/2017   History of Roux-en-Y gastric bypass 2018 03/21/2017   Intractable chronic migraine without aura 06/04/2015   Abnormal uterine bleeding 05/13/2015   Pancreatitis 02/27/2014   Fatty liver disease, nonalcoholic 02/25/2014   Metabolic syndrome 02/20/2014   Vitamin D deficiency    DM (diabetes mellitus) 03/12/2013   Surgery, elective 06/12/2011   Hypothyroidism    Constipation, chronic    Vitamin B 12 deficiency    ALLERGIC RHINITIS 12/31/2010   BARRETTS ESOPHAGUS 12/31/2010   Gastroparesis 12/31/2010   Bilateral chronic knee pain 12/31/2010   Obstructive sleep apnea 09/27/2010   Migraine 09/27/2010   Cyst of ovary 10/19/2009     REFERRING PROVIDER: Delynn Flavin DO.  REFERRING DIAG: Spasm of Thoracic muscle.  Rationale for Evaluation and Treatment:  Rehabilitation  THERAPY DIAG:  Pain in thoracic spine  Muscle spasm of back  ONSET DATE: January 2024.  SUBJECTIVE:  SUBJECTIVE STATEMENT: Pain at a 4/10 on the left side mid-back today. Doing Better with less pain  PERTINENT HISTORY:  Lumbar fusion (08/2022), left shoulder surgery.  PAIN:  Are you having pain? 4/10 today  PRECAUTIONS: None  WEIGHT BEARING RESTRICTIONS: No  FALLS:  Has patient fallen in last 6 months? No  LIVING ENVIRONMENT: Lives in: House/apartment   PLOF: Independent  PATIENT GOALS: Return to swimming and use UE's without mid-back pain.    OBJECTIVE:    PATIENT SURVEYS:  FOTO . 39.   POSTURE: rounded shoulders and forward head  PALPATION: Tender to palpation over left thoracic musculature near the medial scapular border attachment and to a lesser degree on the right.  UE ROM:   Full active bilateral UE AROM.    LOWER EXTREMITY MMT:    Left mid-trap/Rhomboid strength 4/5 per contralateral comparison.  DTR's:  UE DTR's are normal.   TODAY'S TREATMENT:                                                                                                                              DATE:                                                                03-27-23  UBE x 6 mins 120 RPM's Combo e'stim/US at 1.50 W/CM2 x 12 minutes to affected left scapular region RT side lying  STW/M x 10 minutes to same area   HMP and IFC at 80-150 Hz on 40% scan x 15 minutes to LT   Thoracic/scapular musculature.  Patient tolerated treatment without complaint with normal modality response following removal of modality.  ASSESSMENT:  CLINICAL IMPRESSION: Patient with most pain in left thoracic region , but less today 4/10 today . Rx focused on LT side thoracic paras as well as  rhomboids again. Pt was able to perform  6 mins on the UBE today and did well. Palpably tender with good response to [place] combo and  STW/M.  Patient tolerated treatment well today with decreased pain end of session. Pt to try Red Tband Rows at home today  OBJECTIVE IMPAIRMENTS: decreased activity tolerance, decreased strength, increased muscle spasms, postural dysfunction, and pain.   ACTIVITY LIMITATIONS: carrying, lifting, bending, sitting, standing, sleeping, and reach over head  PARTICIPATION LIMITATIONS: meal prep, cleaning, and laundry  REHAB POTENTIAL: Excellent  CLINICAL DECISION MAKING: Stable/uncomplicated  EVALUATION COMPLEXITY: Low   GOALS:  SHORT TERM GOALS: Target date: 03/29/23  Ind with a HEP. Goal status: INITIAL   LONG TERM GOALS: Target date: 04/26/23  Perform ADL's with pain not > 2-3/10.  Goal status: INITIAL  2.  Sit 30 minutes with pain not > 2-3/10.  Goal status: INITIAL  3.  Return  to swimming with pain not > 2-3/10.  Goal status: INITIAL PLAN:  PT FREQUENCY: 2x/week  PT DURATION: 6 weeks  PLANNED INTERVENTIONS: Therapeutic exercises, Therapeutic activity, Patient/Family education, Self Care, Electrical stimulation, Cryotherapy, Moist heat, Ultrasound, and Manual therapy.  PLAN FOR NEXT SESSION: Combo e'stim/US, STW/M, scapular strengthening and postural exercises.   Sandon Yoho,CHRIS, PTA 03/27/2023, 9:11 AM

## 2023-03-29 ENCOUNTER — Ambulatory Visit: Payer: Medicare Other | Admitting: Physical Therapy

## 2023-03-29 DIAGNOSIS — M546 Pain in thoracic spine: Secondary | ICD-10-CM

## 2023-03-29 DIAGNOSIS — M62830 Muscle spasm of back: Secondary | ICD-10-CM | POA: Diagnosis not present

## 2023-03-29 DIAGNOSIS — M5459 Other low back pain: Secondary | ICD-10-CM | POA: Diagnosis not present

## 2023-03-29 NOTE — Therapy (Signed)
OUTPATIENT PHYSICAL THERAPY THORACOLUMBAR TREATMENT   Patient Name: Morgan [name] MRN: [id number] DOB:[birth year], 57 y.o., female Today's Date: 03/29/2023  END OF SESSION:  PT End of Session - 03/29/23 0935     Visit Number 6    Number of Visits 12    Date for PT Re-Evaluation 04/26/23    Authorization Type FOTO.    PT Start Time 0815    PT Stop Time 0908    PT Time Calculation (min) 53 min    Activity Tolerance Patient tolerated treatment well    Behavior During Therapy [facility] for tasks assessed/performed             Past Medical History:  Diagnosis Date   Allergic rhinitis    Anemia     after gastric bypass in 2018   Anxiety    Barrett's esophagus    Bipolar affective disorder    CAP (community acquired pneumonia) 07/20/2018   Chronic respiratory failure    Constipation, chronic    Degenerative joint disease of spine    Depression    Diabetes mellitus without complication    type 2    DM (diabetes mellitus)    "hypoglycemic" per pt - no longer takes DM meds due to weight loss from gastric bypass in 2018   Dry eye    Family history of adverse reaction to anesthesia    nausea and vomiting   Gastroparesis    GERD (gastroesophageal reflux disease)    H/O shoulder replacement 08/11/2020   left shoulder   History of bariatric surgery 03/2017   History of hiatal hernia    HLD (hyperlipidemia) 03/12/2013   Hyperlipidemia    Hypertension    No HTN meds since weight loss from Gastric Bypass in 2018   Intractable chronic migraine without aura 06/04/2015   Migraine headache    Morbid obesity    OSA (obstructive sleep apnea)    No cpap since gastric surgery   Osteoarthritis    bilateral knee   SBO (small bowel obstruction) 03/19/2019   Sleep apnea    Unspecified hypothyroidism    Vitamin B 12 deficiency    Vitamin D deficiency    Past Surgical History:  Procedure Laterality Date   ANKLE ARTHROSCOPY WITH RECONSTRUCTION Right 09/24/2019   Procedure: ANKLE  ARTHROSCOPY DEBRIDEMENT TREATMENT OF OSTEOCHONDRAL LESION TALUS. PRONEAL TENDON DEBRIDEMENT;  Surgeon: Terance Hart, MD;  Location: [facility];  Service: Orthopedics;  Laterality: Right;  SURGERY REQUEST TIME 2 HOURS   APPENDECTOMY  1978   CHOLECYSTECTOMY  2005   COLONOSCOPY     disc repair  05/17/2019   with rods, lumbar spine   GASTRIC ROUX-EN-Y N/A 03/21/2017   Procedure: LAPAROSCOPIC ROUX-EN-Y GASTRIC, UPPER ENDO;  Surgeon: Gaynelle Adu, MD;  Location: [facility];  Service: General;  Laterality: N/A;   GASTROSTOMY N/A 06/19/2018   Procedure: LAPRASCOPIC INSERTION OF GASTROSTOMY TUBE;  Surgeon: Gaynelle Adu, MD;  Location: [facility];  Service: General;  Laterality: N/A;   GASTROSTOMY TUBE PLACEMENT Left    06/2018   HIATAL HERNIA REPAIR N/A 06/19/2018   Procedure: LAPAROSCOPIC REPAIR OF HIATAL HERNIA;  Surgeon: Gaynelle Adu, MD;  Location: [facility];  Service: General;  Laterality: N/A;   LAPAROSCOPIC LYSIS OF ADHESIONS  03/19/2019   Dr. Phylliss Blakes   LAPAROSCOPY N/A 06/19/2018   Procedure: LAPAROSCOPY DIAGNOSTIC;  Surgeon: Gaynelle Adu, MD;  Location: [facility];  Service: General;  Laterality: N/A;   LAPAROSCOPY N/A 03/19/2019   Procedure: LAPAROSCOPY DIAGNOSTIC  lysis of adhesions;  Surgeon: Berna Bue, MD;  Location: [facility];  Service: General;  Laterality: N/A;   LUMBAR LAMINECTOMY/DECOMPRESSION MICRODISCECTOMY Right 10/11/2018   Procedure: LAMINECTOMY AND FORAMINOTOMY RIGHT LUMBAR FOUR- LUMBAR FIVE;  Surgeon: Lisbeth Renshaw, MD;  Location: [facility];  Service: Neurosurgery;  Laterality: Right;   MASS EXCISION Right 09/23/2021   Procedure: EXCISION MASS VOLAR ASPECT RIGHT HAND;  Surgeon: Betha Loa, MD;  Location: [facility];  Service: Orthopedics;  Laterality: Right;  45 MIN   SHOULDER ARTHROSCOPY  06/2011   left-dsc   TONSILLECTOMY  at age 77   TOTAL SHOULDER ARTHROPLASTY Left 08/11/2020   Procedure: TOTAL SHOULDER ARTHROPLASTY;  Surgeon: Teryl Lucy, MD;  Location: [facility];  Service: Orthopedics;  Laterality: Left;   TRIGGER FINGER RELEASE  12/20/2012   Procedure: RELEASE TRIGGER FINGER/A-1 PULLEY;  Surgeon: Tami Ribas, MD;  Location: [facility];  Service: Orthopedics;  Laterality: Left;  LEFT TRIGGER THUMB RELEASE   TRIGGER FINGER RELEASE Right 09/23/2021   Procedure: RELEASE A-1 PULLEY RIGHT INDEX FINGER;  Surgeon: Betha Loa, MD;  Location: [facility];  Service: Orthopedics;  Laterality: Right;   Patient Active Problem List   Diagnosis Date Noted   Spondylolysis of lumbar region 09/09/2022   Osteoarthritis of left shoulder 08/11/2020   S/P shoulder replacement, left 08/11/2020   AMS (altered mental status) 05/19/2019   SBO (small bowel obstruction) s/p lap LOA 03/19/2019 03/19/2019   Reactive hypoglycemia 02/12/2019   Chronic migraine without aura, with intractable migraine, so stated, with status migrainosus 11/20/2018   Lumbar radiculopathy 10/11/2018   Atelectasis    Hypoglycemia    Hypotension    Bradycardia    Epigastric pain 06/19/2018   History of adenomatous polyp of colon 10/23/2017   Hyperlipidemia associated with type 2 diabetes mellitus 07/06/2017   GERD (gastroesophageal reflux disease) 03/21/2017   History of Roux-en-Y gastric bypass 2018 03/21/2017   Intractable chronic migraine without aura 06/04/2015   Abnormal uterine bleeding 05/13/2015   Pancreatitis 02/27/2014   Fatty liver disease, nonalcoholic 02/25/2014   Metabolic syndrome 02/20/2014   Vitamin D deficiency    DM (diabetes mellitus) 03/12/2013   Surgery, elective 06/12/2011   Hypothyroidism    Constipation, chronic    Vitamin B 12 deficiency    ALLERGIC RHINITIS 12/31/2010   BARRETTS ESOPHAGUS 12/31/2010   Gastroparesis 12/31/2010   Bilateral chronic knee pain 12/31/2010   Obstructive sleep apnea 09/27/2010   Migraine 09/27/2010   Cyst of ovary 10/19/2009     REFERRING PROVIDER:  Delynn Flavin DO.  REFERRING DIAG: Spasm of Thoracic muscle.  Rationale for Evaluation and Treatment: Rehabilitation  THERAPY DIAG:  Pain in thoracic spine  Muscle spasm of back  ONSET DATE: January 2024.  SUBJECTIVE:  SUBJECTIVE STATEMENT: Was using the red band to do the shoulder blade squeeze exercise and it increased my pain to a 6.  PERTINENT HISTORY:  Lumbar fusion (08/2022), left shoulder surgery.  PAIN:  Are you having pain? 4/10 today  PRECAUTIONS: None  WEIGHT BEARING RESTRICTIONS: No  FALLS:  Has patient fallen in last 6 months? No  LIVING ENVIRONMENT: Lives in: House/apartment   PLOF: Independent  PATIENT GOALS: Return to swimming and use UE's without mid-back pain.    OBJECTIVE:    PATIENT SURVEYS:  FOTO . 39.   POSTURE: rounded shoulders and forward head  PALPATION: Tender to palpation over left thoracic musculature near the medial scapular border attachment and to a lesser degree on the right.  UE ROM:   Full active bilateral UE AROM.    LOWER EXTREMITY MMT:    Left mid-trap/Rhomboid strength 4/5 per contralateral comparison.  DTR's:  UE DTR's are normal.   TODAY'S TREATMENT:                                                                                                                              DATE:                                                                03-29-23  UBE x 10 mins 120 RPM's In prone:  STW/M x 13 minutes to patient's affected thoracic region including gentle PA mobs which also included left costo-vertebral mobs f/b    HMP and IFC at 80-150 Hz on 40% scan x 20 minutes to LT   Thoracic/scapular musculature.  Patient tolerated treatment without complaint with normal modality response following removal of  modality.  ASSESSMENT:  CLINICAL IMPRESSION: Patient with increased pain due to using red band to performed scapular retraction exercise.  Told her to reduce to the less resistive band (yellow) and she was provided with yellow theraband.  She had a great response to treatment today and felt significantly better after treatment.  OBJECTIVE IMPAIRMENTS: decreased activity tolerance, decreased strength, increased muscle spasms, postural dysfunction, and pain.   ACTIVITY LIMITATIONS: carrying, lifting, bending, sitting, standing, sleeping, and reach over head  PARTICIPATION LIMITATIONS: meal prep, cleaning, and laundry  REHAB POTENTIAL: Excellent  CLINICAL DECISION MAKING: Stable/uncomplicated  EVALUATION COMPLEXITY: Low   GOALS:  SHORT TERM GOALS: Target date: 03/29/23  Ind with a HEP. Goal status: INITIAL   LONG TERM GOALS: Target date: 04/26/23  Perform ADL's with pain not > 2-3/10.  Goal status: INITIAL  2.  Sit 30 minutes with pain not > 2-3/10.  Goal status: INITIAL  3.  Return to swimming with pain not > 2-3/10.  Goal status: INITIAL PLAN:  PT FREQUENCY: 2x/week  PT DURATION: 6 weeks  PLANNED INTERVENTIONS: Therapeutic exercises, Therapeutic activity, Patient/Family education, Self Care, Electrical stimulation, Cryotherapy, Moist heat, Ultrasound, and Manual therapy.  PLAN FOR NEXT SESSION: Combo e'stim/US, STW/M, scapular strengthening and postural exercises.   Unique Sillas, [place], PT 03/29/2023, 9:45 AM

## 2023-04-05 ENCOUNTER — Encounter: Payer: Self-pay | Admitting: Physical Therapy

## 2023-04-05 ENCOUNTER — Ambulatory Visit: Payer: Medicare Other | Admitting: Physical Therapy

## 2023-04-05 DIAGNOSIS — M62830 Muscle spasm of back: Secondary | ICD-10-CM | POA: Diagnosis not present

## 2023-04-05 DIAGNOSIS — M546 Pain in thoracic spine: Secondary | ICD-10-CM | POA: Diagnosis not present

## 2023-04-05 DIAGNOSIS — M5459 Other low back pain: Secondary | ICD-10-CM | POA: Diagnosis not present

## 2023-04-05 NOTE — Therapy (Signed)
OUTPATIENT PHYSICAL THERAPY THORACOLUMBAR TREATMENT   Patient Name: Morgan [name] MRN: [id number] DOB:04/27/1966, 57 y.o., female Today's Date: 04/05/2023  END OF SESSION:  PT End of Session - 04/05/23 0838     Visit Number 7    Number of Visits 12    Date for PT Re-Evaluation 04/26/23    Authorization Type FOTO.    PT Start Time 0801    PT Stop Time 0857    PT Time Calculation (min) 56 min    Activity Tolerance Patient tolerated treatment well    Behavior During Therapy [facility] for tasks assessed/performed             Past Medical History:  Diagnosis Date   Allergic rhinitis    Anemia     after gastric bypass in 2018   Anxiety    Barrett's esophagus    Bipolar affective disorder    CAP (community acquired pneumonia) 07/20/2018   Chronic respiratory failure    Constipation, chronic    Degenerative joint disease of spine    Depression    Diabetes mellitus without complication    type 2    DM (diabetes mellitus)    "hypoglycemic" per pt - no longer takes DM meds due to weight loss from gastric bypass in 2018   Dry eye    Family history of adverse reaction to anesthesia    nausea and vomiting   Gastroparesis    GERD (gastroesophageal reflux disease)    H/O shoulder replacement 08/11/2020   left shoulder   History of bariatric surgery 03/2017   History of hiatal hernia    HLD (hyperlipidemia) 03/12/2013   Hyperlipidemia    Hypertension    No HTN meds since weight loss from Gastric Bypass in 2018   Intractable chronic migraine without aura 06/04/2015   Migraine headache    Morbid obesity    OSA (obstructive sleep apnea)    No cpap since gastric surgery   Osteoarthritis    bilateral knee   SBO (small bowel obstruction) 03/19/2019   Sleep apnea    Unspecified hypothyroidism    Vitamin B 12 deficiency    Vitamin D deficiency    Past Surgical History:  Procedure Laterality Date   ANKLE ARTHROSCOPY WITH RECONSTRUCTION Right 09/24/2019   Procedure: ANKLE  ARTHROSCOPY DEBRIDEMENT TREATMENT OF OSTEOCHONDRAL LESION TALUS. PRONEAL TENDON DEBRIDEMENT;  Surgeon: Terance Hart, MD;  Location:  [facility];  Service: Orthopedics;  Laterality: Right;  SURGERY REQUEST TIME 2 HOURS   APPENDECTOMY  1978   CHOLECYSTECTOMY  2005   COLONOSCOPY     disc repair  05/17/2019   with rods, lumbar spine   GASTRIC ROUX-EN-Y N/A 03/21/2017   Procedure: LAPAROSCOPIC ROUX-EN-Y GASTRIC, UPPER ENDO;  Surgeon: Gaynelle Adu, MD;  Location: [facility];  Service: General;  Laterality: N/A;   GASTROSTOMY N/A 06/19/2018   Procedure: LAPRASCOPIC INSERTION OF GASTROSTOMY TUBE;  Surgeon: Gaynelle Adu, MD;  Location: [facility];  Service: General;  Laterality: N/A;   GASTROSTOMY TUBE PLACEMENT Left    06/2018   HIATAL HERNIA REPAIR N/A 06/19/2018   Procedure: LAPAROSCOPIC REPAIR OF HIATAL HERNIA;  Surgeon: Gaynelle Adu, MD;  Location: [facility];  Service: General;  Laterality: N/A;   LAPAROSCOPIC LYSIS OF ADHESIONS  03/19/2019   Dr. Phylliss Blakes   LAPAROSCOPY N/A 06/19/2018   Procedure: LAPAROSCOPY DIAGNOSTIC;  Surgeon: Gaynelle Adu, MD;  Location: [facility];  Service: General;  Laterality: N/A;   LAPAROSCOPY N/A 03/19/2019   Procedure: LAPAROSCOPY DIAGNOSTIC  lysis of adhesions;  Surgeon: Berna Bue, MD;  Location: [facility];  Service: General;  Laterality: N/A;   LUMBAR LAMINECTOMY/DECOMPRESSION MICRODISCECTOMY Right 10/11/2018   Procedure: LAMINECTOMY AND FORAMINOTOMY RIGHT LUMBAR FOUR- LUMBAR FIVE;  Surgeon: Lisbeth Renshaw, MD;  Location: [facility];  Service: Neurosurgery;  Laterality: Right;   MASS EXCISION Right 09/23/2021   Procedure: EXCISION MASS VOLAR ASPECT RIGHT HAND;  Surgeon: Betha Loa, MD;  Location: [facility];  Service: Orthopedics;  Laterality: Right;  45 MIN   SHOULDER ARTHROSCOPY  06/2011   left-dsc   TONSILLECTOMY  at age 77   TOTAL SHOULDER ARTHROPLASTY Left 08/11/2020   Procedure: TOTAL SHOULDER ARTHROPLASTY;  Surgeon: Teryl Lucy, MD;  Location: [facility];  Service: Orthopedics;  Laterality: Left;   TRIGGER FINGER RELEASE  12/20/2012   Procedure: RELEASE TRIGGER FINGER/A-1 PULLEY;  Surgeon: Tami Ribas, MD;  Location: [facility];  Service: Orthopedics;  Laterality: Left;  LEFT TRIGGER THUMB RELEASE   TRIGGER FINGER RELEASE Right 09/23/2021   Procedure: RELEASE A-1 PULLEY RIGHT INDEX FINGER;  Surgeon: Betha Loa, MD;  Location: [facility];  Service: Orthopedics;  Laterality: Right;   Patient Active Problem List   Diagnosis Date Noted   Spondylolysis of lumbar region 09/09/2022   Osteoarthritis of left shoulder 08/11/2020   S/P shoulder replacement, left 08/11/2020   AMS (altered mental status) 05/19/2019   SBO (small bowel obstruction) s/p lap LOA 03/19/2019 03/19/2019   Reactive hypoglycemia 02/12/2019   Chronic migraine without aura, with intractable migraine, so stated, with status migrainosus 11/20/2018   Lumbar radiculopathy 10/11/2018   Atelectasis    Hypoglycemia    Hypotension    Bradycardia    Epigastric pain 06/19/2018   History of adenomatous polyp of colon 10/23/2017   Hyperlipidemia associated with type 2 diabetes mellitus 07/06/2017   GERD (gastroesophageal reflux disease) 03/21/2017   History of Roux-en-Y gastric bypass 2018 03/21/2017   Intractable chronic migraine without aura 06/04/2015   Abnormal uterine bleeding 05/13/2015   Pancreatitis 02/27/2014   Fatty liver disease, nonalcoholic 02/25/2014   Metabolic syndrome 02/20/2014   Vitamin D deficiency    DM (diabetes mellitus) 03/12/2013   Surgery, elective 06/12/2011   Hypothyroidism    Constipation, chronic    Vitamin B 12 deficiency    ALLERGIC RHINITIS 12/31/2010   BARRETTS ESOPHAGUS 12/31/2010   Gastroparesis 12/31/2010   Bilateral chronic knee pain 12/31/2010   Obstructive sleep apnea 09/27/2010   Migraine 09/27/2010   Cyst of ovary 10/19/2009     REFERRING PROVIDER:  Delynn Flavin DO.  REFERRING DIAG: Spasm of Thoracic muscle.  Rationale for Evaluation and Treatment: Rehabilitation  THERAPY DIAG:  Pain in thoracic spine  Muscle spasm of back  ONSET DATE: January 2024.  SUBJECTIVE:  SUBJECTIVE STATEMENT: Felt real good after last treatment then went shopping and carried purse over shoulder and pain came back.  PERTINENT HISTORY:  Lumbar fusion (08/2022), left shoulder surgery.  PAIN:  Are you having pain? 6/10 today  PRECAUTIONS: None  WEIGHT BEARING RESTRICTIONS: No  FALLS:  Has patient fallen in last 6 months? No  LIVING ENVIRONMENT: Lives in: House/apartment   PLOF: Independent  PATIENT GOALS: Return to swimming and use UE's without mid-back pain.    OBJECTIVE:    PATIENT SURVEYS:  FOTO . 39.   POSTURE: rounded shoulders and forward head  PALPATION: Tender to palpation over left thoracic musculature near the medial scapular border attachment and to a lesser degree on the right.  UE ROM:   Full active bilateral UE AROM.    LOWER EXTREMITY MMT:    Left mid-trap/Rhomboid strength 4/5 per contralateral comparison.  DTR's:  UE DTR's are normal.   TODAY'S TREATMENT:                                                                                                                              DATE:                                                                04-05-23 Nustep level 3 x 10 minutes In prone:  STW/M x 13 minutes to patient's affected thoracic region including gentle PA mobs which also included left costo-vertebral mobs f/b    HMP and IFC at 80-150 Hz on 40% scan x 20 minutes to LT   Thoracic/scapular musculature.  Patient tolerated treatment without complaint with normal modality response following removal of  modality.  ASSESSMENT:  CLINICAL IMPRESSION:Good response to last treatment but flare-up after shopping and carrying purse over shoulder. Pain reduced to a 2 after treatment.  OBJECTIVE IMPAIRMENTS: decreased activity tolerance, decreased strength, increased muscle spasms, postural dysfunction, and pain.   ACTIVITY LIMITATIONS: carrying, lifting, bending, sitting, standing, sleeping, and reach over head  PARTICIPATION LIMITATIONS: meal prep, cleaning, and laundry  REHAB POTENTIAL: Excellent  CLINICAL DECISION MAKING: Stable/uncomplicated  EVALUATION COMPLEXITY: Low   GOALS:  SHORT TERM GOALS: Target date: 03/29/23  Ind with a HEP. Goal status: INITIAL   LONG TERM GOALS: Target date: 04/26/23  Perform ADL's with pain not > 2-3/10.  Goal status: INITIAL  2.  Sit 30 minutes with pain not > 2-3/10.  Goal status: INITIAL  3.  Return to swimming with pain not > 2-3/10.  Goal status: INITIAL PLAN:  PT FREQUENCY: 2x/week  PT DURATION: 6 weeks  PLANNED INTERVENTIONS: Therapeutic exercises, Therapeutic activity, Patient/Family education, Self Care, Electrical stimulation, Cryotherapy, Moist heat, Ultrasound, and Manual therapy.  PLAN FOR NEXT SESSION: Combo e'stim/US, STW/M, scapular strengthening and  postural exercises.   Nalda Shackleford, [place], PT 04/05/2023, 8:58 AM

## 2023-04-12 ENCOUNTER — Ambulatory Visit: Payer: Medicare Other | Attending: Family Medicine | Admitting: Physical Therapy

## 2023-04-12 DIAGNOSIS — M546 Pain in thoracic spine: Secondary | ICD-10-CM

## 2023-04-12 DIAGNOSIS — M62830 Muscle spasm of back: Secondary | ICD-10-CM

## 2023-04-12 NOTE — Therapy (Signed)
OUTPATIENT PHYSICAL THERAPY THORACOLUMBAR TREATMENT   Patient Name: Morgan [name] MRN: [id number] DOB:05/08/1966, 57 y.o., female Today's Date: 04/12/2023  END OF SESSION:  PT End of Session - 04/12/23 0855     Visit Number 8    Number of Visits 12    Date for PT Re-Evaluation 04/26/23    Authorization Type FOTO.    PT Start Time 0806    PT Stop Time 0858    PT Time Calculation (min) 52 min    Activity Tolerance Patient tolerated treatment well    Behavior During Therapy [facility] for tasks assessed/performed             Past Medical History:  Diagnosis Date   Allergic rhinitis    Anemia     after gastric bypass in 2018   Anxiety    Barrett's esophagus    Bipolar affective disorder (HCC)    CAP (community acquired pneumonia) 07/20/2018   Chronic respiratory failure (HCC)    Constipation, chronic    Degenerative joint disease of spine    Depression    Diabetes mellitus without complication (HCC)    type 2    DM (diabetes mellitus) (HCC)    "hypoglycemic" per pt - no longer takes DM meds due to weight loss from gastric bypass in 2018   Dry eye    Family history of adverse reaction to anesthesia    nausea and vomiting   Gastroparesis    GERD (gastroesophageal reflux disease)    H/O shoulder replacement 08/11/2020   left shoulder   History of bariatric surgery 03/2017   History of hiatal hernia    HLD (hyperlipidemia) 03/12/2013   Hyperlipidemia    Hypertension    No HTN meds since weight loss from Gastric Bypass in 2018   Intractable chronic migraine without aura 06/04/2015   Migraine headache    Morbid obesity (HCC)    OSA (obstructive sleep apnea)    No cpap since gastric surgery   Osteoarthritis    bilateral knee   SBO (small bowel obstruction) (HCC) 03/19/2019   Sleep apnea    Unspecified hypothyroidism    Vitamin B 12 deficiency    Vitamin D deficiency    Past Surgical History:  Procedure Laterality Date   ANKLE ARTHROSCOPY WITH RECONSTRUCTION Right  09/24/2019   Procedure: ANKLE ARTHROSCOPY DEBRIDEMENT TREATMENT OF OSTEOCHONDRAL LESION TALUS. PRONEAL TENDON DEBRIDEMENT;  Surgeon: Terance Hart, MD;  Location: [facility];  Service: Orthopedics;  Laterality: Right;  SURGERY REQUEST TIME 2 HOURS   APPENDECTOMY  1978   CHOLECYSTECTOMY  2005   COLONOSCOPY     disc repair  05/17/2019   with rods, lumbar spine   GASTRIC ROUX-EN-Y N/A 03/21/2017   Procedure: LAPAROSCOPIC ROUX-EN-Y GASTRIC, UPPER ENDO;  Surgeon: Gaynelle Adu, MD;  Location: [facility];  Service: General;  Laterality: N/A;   GASTROSTOMY N/A 06/19/2018   Procedure: LAPRASCOPIC INSERTION OF GASTROSTOMY TUBE;  Surgeon: Gaynelle Adu, MD;  Location: [facility];  Service: General;  Laterality: N/A;   GASTROSTOMY TUBE PLACEMENT Left    06/2018   HIATAL HERNIA REPAIR N/A 06/19/2018   Procedure: LAPAROSCOPIC REPAIR OF HIATAL HERNIA;  Surgeon: Gaynelle Adu, MD;  Location: [facility];  Service: General;  Laterality: N/A;   LAPAROSCOPIC LYSIS OF ADHESIONS  03/19/2019   Dr. Phylliss Blakes   LAPAROSCOPY N/A 06/19/2018   Procedure: LAPAROSCOPY DIAGNOSTIC;  Surgeon: Gaynelle Adu, MD;  Location: [facility];  Service: General;  Laterality: N/A;   LAPAROSCOPY N/A  03/19/2019   Procedure: LAPAROSCOPY DIAGNOSTIC lysis of adhesions;  Surgeon: Berna Bue, MD;  Location: [facility];  Service: General;  Laterality: N/A;   LUMBAR LAMINECTOMY/DECOMPRESSION MICRODISCECTOMY Right 10/11/2018   Procedure: LAMINECTOMY AND FORAMINOTOMY RIGHT LUMBAR FOUR- LUMBAR FIVE;  Surgeon: Lisbeth Renshaw, MD;  Location: [facility];  Service: Neurosurgery;  Laterality: Right;   MASS EXCISION Right 09/23/2021   Procedure: EXCISION MASS VOLAR ASPECT RIGHT HAND;  Surgeon: Betha Loa, MD;  Location: [facility];  Service: Orthopedics;  Laterality: Right;  45 MIN   SHOULDER ARTHROSCOPY  06/2011   left-dsc   TONSILLECTOMY  at age 60   TOTAL SHOULDER ARTHROPLASTY Left 08/11/2020   Procedure: TOTAL SHOULDER  ARTHROPLASTY;  Surgeon: Teryl Lucy, MD;  Location: [facility];  Service: Orthopedics;  Laterality: Left;   TRIGGER FINGER RELEASE  12/20/2012   Procedure: RELEASE TRIGGER FINGER/A-1 PULLEY;  Surgeon: Tami Ribas, MD;  Location: [facility];  Service: Orthopedics;  Laterality: Left;  LEFT TRIGGER THUMB RELEASE   TRIGGER FINGER RELEASE Right 09/23/2021   Procedure: RELEASE A-1 PULLEY RIGHT INDEX FINGER;  Surgeon: Betha Loa, MD;  Location: [facility];  Service: Orthopedics;  Laterality: Right;   Patient Active Problem List   Diagnosis Date Noted   Spondylolysis of lumbar region 09/09/2022   Osteoarthritis of left shoulder 08/11/2020   S/P shoulder replacement, left 08/11/2020   AMS (altered mental status) 05/19/2019   SBO (small bowel obstruction) s/p lap LOA 03/19/2019 03/19/2019   Reactive hypoglycemia 02/12/2019   Chronic migraine without aura, with intractable migraine, so stated, with status migrainosus 11/20/2018   Lumbar radiculopathy 10/11/2018   Atelectasis    Hypoglycemia    Hypotension    Bradycardia    Epigastric pain 06/19/2018   History of adenomatous polyp of colon 10/23/2017   Hyperlipidemia associated with type 2 diabetes mellitus (HCC) 07/06/2017   GERD (gastroesophageal reflux disease) 03/21/2017   History of Roux-en-Y gastric bypass 2018 03/21/2017   Intractable chronic migraine without aura 06/04/2015   Abnormal uterine bleeding 05/13/2015   Pancreatitis 02/27/2014   Fatty liver disease, nonalcoholic 02/25/2014   Metabolic syndrome 02/20/2014   Vitamin D deficiency    DM (diabetes mellitus) (HCC) 03/12/2013   Surgery, elective 06/12/2011   Hypothyroidism    Constipation, chronic    Vitamin B 12 deficiency    ALLERGIC RHINITIS 12/31/2010   BARRETTS ESOPHAGUS 12/31/2010   Gastroparesis 12/31/2010   Bilateral chronic knee pain 12/31/2010   Obstructive sleep apnea 09/27/2010   Migraine 09/27/2010   Cyst of  ovary 10/19/2009     REFERRING PROVIDER: Delynn Flavin DO.  REFERRING DIAG: Spasm of Thoracic muscle.  Rationale for Evaluation and Treatment: Rehabilitation  THERAPY DIAG:  Pain in thoracic spine  Muscle spasm of back  ONSET DATE: January 2024.  SUBJECTIVE:  SUBJECTIVE STATEMENT: Felt real good after last treatment then went shopping and carried purse over shoulder and pain came back.  PERTINENT HISTORY:  Lumbar fusion (08/2022), left shoulder surgery.  PAIN:  Are you having pain? 6/10 today  PRECAUTIONS: None  WEIGHT BEARING RESTRICTIONS: No  FALLS:  Has patient fallen in last 6 months? No  LIVING ENVIRONMENT: Lives in: House/apartment   PLOF: Independent  PATIENT GOALS: Return to swimming and use UE's without mid-back pain.    OBJECTIVE:    PATIENT SURVEYS:  FOTO . 39.   POSTURE: rounded shoulders and forward head  PALPATION: Tender to palpation over left thoracic musculature near the medial scapular border attachment and to a lesser degree on the right.  UE ROM:   Full active bilateral UE AROM.    LOWER EXTREMITY MMT:    Left mid-trap/Rhomboid strength 4/5 per contralateral comparison.  DTR's:  UE DTR's are normal.   TODAY'S TREATMENT:                                                                                                                              DATE:                                                                04/12/23 UBE at 90 RPM's x 8 minutes In prone:  STW/M x 15 minutes to patient's affected thoracic region including gentle PA mobs which also included left costo-vertebral mobs f/b    HMP and IFC at 80-150 Hz on 40% scan x 20 minutes to LT   Thoracic/scapular musculature.  Patient tolerated treatment without complaint with normal  modality response following removal of modality.  ASSESSMENT:  CLINICAL IMPRESSION:Low pain-level over the last week.  No pain reported after treatment.  Plan to see patient in a week and likely d/c.  OBJECTIVE IMPAIRMENTS: decreased activity tolerance, decreased strength, increased muscle spasms, postural dysfunction, and pain.   ACTIVITY LIMITATIONS: carrying, lifting, bending, sitting, standing, sleeping, and reach over head  PARTICIPATION LIMITATIONS: meal prep, cleaning, and laundry  REHAB POTENTIAL: Excellent  CLINICAL DECISION MAKING: Stable/uncomplicated  EVALUATION COMPLEXITY: Low   GOALS:  SHORT TERM GOALS: Target date: 03/29/23  Ind with a HEP. Goal status: INITIAL   LONG TERM GOALS: Target date: 04/26/23  Perform ADL's with pain not > 2-3/10.  Goal status: INITIAL  2.  Sit 30 minutes with pain not > 2-3/10.  Goal status: INITIAL  3.  Return to swimming with pain not > 2-3/10.  Goal status: INITIAL PLAN:  PT FREQUENCY: 2x/week  PT DURATION: 6 weeks  PLANNED INTERVENTIONS: Therapeutic exercises, Therapeutic activity, Patient/Family education, Self Care, Electrical stimulation, Cryotherapy, Moist heat, Ultrasound, and Manual therapy.  PLAN FOR NEXT SESSION: Combo e'stim/US, STW/M,  scapular strengthening and postural exercises.   Aislinn Feliz, [place], PT 04/12/2023, 9:24 AM

## 2023-04-14 ENCOUNTER — Ambulatory Visit: Payer: Medicare Other | Admitting: *Deleted

## 2023-04-19 ENCOUNTER — Other Ambulatory Visit: Payer: Self-pay | Admitting: Family Medicine

## 2023-04-19 ENCOUNTER — Ambulatory Visit: Payer: Medicare Other | Admitting: Physical Therapy

## 2023-04-19 DIAGNOSIS — M546 Pain in thoracic spine: Secondary | ICD-10-CM

## 2023-04-19 DIAGNOSIS — M62830 Muscle spasm of back: Secondary | ICD-10-CM

## 2023-04-19 NOTE — Therapy (Signed)
OUTPATIENT PHYSICAL THERAPY THORACOLUMBAR TREATMENT   Patient Name: Morgan [name] MRN: [id number] DOB:02-Sep-1966, 57 y.o., female Today's Date: 04/19/2023  END OF SESSION:  PT End of Session - 04/19/23 0847     Visit Number 9    Number of Visits 12    Date for PT Re-Evaluation 04/26/23    Authorization Type FOTO.    PT Start Time 0807    PT Stop Time 0900    PT Time Calculation (min) 53 min    Activity Tolerance Patient tolerated treatment well    Behavior During Therapy WFL for tasks assessed/performed             Past Medical History:  Diagnosis Date   Allergic rhinitis    Anemia     after gastric bypass in 2018   Anxiety    Barrett's esophagus    Bipolar affective disorder (HCC)    CAP (community acquired pneumonia) 07/20/2018   Chronic respiratory failure (HCC)    Constipation, chronic    Degenerative joint disease of spine    Depression    Diabetes mellitus without complication (HCC)    type 2    DM (diabetes mellitus) (HCC)    "hypoglycemic" per pt - no longer takes DM meds due to weight loss from gastric bypass in 2018   Dry eye    Family history of adverse reaction to anesthesia    nausea and vomiting   Gastroparesis    GERD (gastroesophageal reflux disease)    H/O shoulder replacement 08/11/2020   left shoulder   History of bariatric surgery 03/2017   History of hiatal hernia    HLD (hyperlipidemia) 03/12/2013   Hyperlipidemia    Hypertension    No HTN meds since weight loss from Gastric Bypass in 2018   Intractable chronic migraine without aura 06/04/2015   Migraine headache    Morbid obesity (HCC)    OSA (obstructive sleep apnea)    No cpap since gastric surgery   Osteoarthritis    bilateral knee   SBO (small bowel obstruction) (HCC) 03/19/2019   Sleep apnea    Unspecified hypothyroidism    Vitamin B 12 deficiency    Vitamin D deficiency    Past Surgical History:  Procedure Laterality Date   ANKLE ARTHROSCOPY WITH RECONSTRUCTION Right  09/24/2019   Procedure: ANKLE ARTHROSCOPY DEBRIDEMENT TREATMENT OF OSTEOCHONDRAL LESION TALUS. PRONEAL TENDON DEBRIDEMENT;  Surgeon: Terance Hart, MD;  Location: [facility];  Service: Orthopedics;  Laterality: Right;  SURGERY REQUEST TIME 2 HOURS   APPENDECTOMY  1978   CHOLECYSTECTOMY  2005   COLONOSCOPY     disc repair  05/17/2019   with rods, lumbar spine   GASTRIC ROUX-EN-Y N/A 03/21/2017   Procedure: LAPAROSCOPIC ROUX-EN-Y GASTRIC, UPPER ENDO;  Surgeon: Gaynelle Adu, MD;  Location: [facility];  Service: General;  Laterality: N/A;   GASTROSTOMY N/A 06/19/2018   Procedure: LAPRASCOPIC INSERTION OF GASTROSTOMY TUBE;  Surgeon: Gaynelle Adu, MD;  Location: [facility];  Service: General;  Laterality: N/A;   GASTROSTOMY TUBE PLACEMENT Left    06/2018   HIATAL HERNIA REPAIR N/A 06/19/2018   Procedure: LAPAROSCOPIC REPAIR OF HIATAL HERNIA;  Surgeon: Gaynelle Adu, MD;  Location: [facility];  Service: General;  Laterality: N/A;   LAPAROSCOPIC LYSIS OF ADHESIONS  03/19/2019   Dr. Phylliss Blakes   LAPAROSCOPY N/A 06/19/2018   Procedure: LAPAROSCOPY DIAGNOSTIC;  Surgeon: Gaynelle Adu, MD;  Location: [facility];  Service: General;  Laterality: N/A;   LAPAROSCOPY N/A  03/19/2019   Procedure: LAPAROSCOPY DIAGNOSTIC lysis of adhesions;  Surgeon: Berna Bue, MD;  Location: [facility];  Service: General;  Laterality: N/A;   LUMBAR LAMINECTOMY/DECOMPRESSION MICRODISCECTOMY Right 10/11/2018   Procedure: LAMINECTOMY AND FORAMINOTOMY RIGHT LUMBAR FOUR- LUMBAR FIVE;  Surgeon: Lisbeth Renshaw, MD;  Location: [facility];  Service: Neurosurgery;  Laterality: Right;   MASS EXCISION Right 09/23/2021   Procedure: EXCISION MASS VOLAR ASPECT RIGHT HAND;  Surgeon: Betha Loa, MD;  Location: [facility];  Service: Orthopedics;  Laterality: Right;  45 MIN   SHOULDER ARTHROSCOPY  06/2011   left-dsc   TONSILLECTOMY  at age 60   TOTAL SHOULDER ARTHROPLASTY Left 08/11/2020   Procedure: TOTAL SHOULDER  ARTHROPLASTY;  Surgeon: Teryl Lucy, MD;  Location: [facility];  Service: Orthopedics;  Laterality: Left;   TRIGGER FINGER RELEASE  12/20/2012   Procedure: RELEASE TRIGGER FINGER/A-1 PULLEY;  Surgeon: Tami Ribas, MD;  Location: [facility];  Service: Orthopedics;  Laterality: Left;  LEFT TRIGGER THUMB RELEASE   TRIGGER FINGER RELEASE Right 09/23/2021   Procedure: RELEASE A-1 PULLEY RIGHT INDEX FINGER;  Surgeon: Betha Loa, MD;  Location: [facility];  Service: Orthopedics;  Laterality: Right;   Patient Active Problem List   Diagnosis Date Noted   Spondylolysis of lumbar region 09/09/2022   Osteoarthritis of left shoulder 08/11/2020   S/P shoulder replacement, left 08/11/2020   AMS (altered mental status) 05/19/2019   SBO (small bowel obstruction) s/p lap LOA 03/19/2019 03/19/2019   Reactive hypoglycemia 02/12/2019   Chronic migraine without aura, with intractable migraine, so stated, with status migrainosus 11/20/2018   Lumbar radiculopathy 10/11/2018   Atelectasis    Hypoglycemia    Hypotension    Bradycardia    Epigastric pain 06/19/2018   History of adenomatous polyp of colon 10/23/2017   Hyperlipidemia associated with type 2 diabetes mellitus (HCC) 07/06/2017   GERD (gastroesophageal reflux disease) 03/21/2017   History of Roux-en-Y gastric bypass 2018 03/21/2017   Intractable chronic migraine without aura 06/04/2015   Abnormal uterine bleeding 05/13/2015   Pancreatitis 02/27/2014   Fatty liver disease, nonalcoholic 02/25/2014   Metabolic syndrome 02/20/2014   Vitamin D deficiency    DM (diabetes mellitus) (HCC) 03/12/2013   Surgery, elective 06/12/2011   Hypothyroidism    Constipation, chronic    Vitamin B 12 deficiency    ALLERGIC RHINITIS 12/31/2010   BARRETTS ESOPHAGUS 12/31/2010   Gastroparesis 12/31/2010   Bilateral chronic knee pain 12/31/2010   Obstructive sleep apnea 09/27/2010   Migraine 09/27/2010   Cyst of  ovary 10/19/2009     REFERRING PROVIDER: Delynn Flavin DO.  REFERRING DIAG: Spasm of Thoracic muscle.  Rationale for Evaluation and Treatment: Rehabilitation  THERAPY DIAG:  Pain in thoracic spine  Muscle spasm of back  ONSET DATE: January 2024.  SUBJECTIVE:  SUBJECTIVE STATEMENT: Been doing very good.  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  Ready for discharge.  PERTINENT HISTORY:  Lumbar fusion (08/2022), left shoulder surgery.  PAIN:  Are you having pain? 6/10 today  PRECAUTIONS: None  WEIGHT BEARING RESTRICTIONS: No  FALLS:  Has patient fallen in last 6 months? No  LIVING ENVIRONMENT: Lives in: House/apartment   PLOF: Independent  PATIENT GOALS: Return to swimming and use UE's without mid-back pain.    OBJECTIVE:    PATIENT SURVEYS:  FOTO . 39.   POSTURE: rounded shoulders and forward head  PALPATION: Tender to palpation over left thoracic musculature near the medial scapular border attachment and to a lesser degree on the right.  UE ROM:   Full active bilateral UE AROM.    LOWER EXTREMITY MMT:    Left mid-trap/Rhomboid strength 4/5 per contralateral comparison.  DTR's:  UE DTR's are normal.   TODAY'S TREATMENT:                                                                                                                              DATE:                                                                04/19/23 Nustep level 3 x 10 minutes In prone:  STW/M x 13 minutes to patient's affected thoracic region including gentle PA mobs which also included left costo-vertebral mobs f/b    HMP and IFC at 80-150 Hz on 40% scan x 20 minutes to LT   Thoracic/scapular musculature.  Patient tolerated treatment without complaint with normal modality response following  removal of modality.  ASSESSMENT:  CLINICAL IMPRESSION:Low pain-level over the last week.  No pain reported after treatment.  Plan to see patient in a week and likely d/c.  OBJECTIVE IMPAIRMENTS: decreased activity tolerance, decreased strength, increased muscle spasms, postural dysfunction, and pain.   ACTIVITY LIMITATIONS: carrying, lifting, bending, sitting, standing, sleeping, and reach over head  PARTICIPATION LIMITATIONS: meal prep, cleaning, and laundry  REHAB POTENTIAL: Excellent  CLINICAL DECISION MAKING: Stable/uncomplicated  EVALUATION COMPLEXITY: Low   GOALS:  SHORT TERM GOALS: Target date: 03/29/23  Ind with a HEP. Goal status: INITIAL   LONG TERM GOALS: Target date: 04/26/23  Perform ADL's with pain not > 2-3/10.  Goal status: MET (04/19/23). 2.  Sit 30 minutes with pain not > 2-3/10.  Goal status: MET (04/19/23). 3.  Return to swimming with pain not > 2-3/10.  Goal status: MET (04/19/23). PLAN:  PT FREQUENCY: 2x/week  PT DURATION: 6 weeks  PLANNED INTERVENTIONS: Therapeutic exercises, Therapeutic activity, Patient/Family education, Self Care, Electrical stimulation, Cryotherapy, Moist heat, Ultrasound, and Manual therapy.  PLAN FOR NEXT SESSION: Combo e'stim/US, STW/M, scapular strengthening  and postural exercises.  PHYSICAL THERAPY DISCHARGE SUMMARY  Visits from Start of Care: 9.  Current functional level related to goals / functional outcomes: See above.   Remaining deficits: All goals met.   Education / Equipment: HEP.   Patient agrees to discharge. Patient goals were met. Patient is being discharged due to meeting the stated rehab goals.    Saffron Busey, [place], PT 04/19/2023, 9:07 AM

## 2023-04-20 DIAGNOSIS — E162 Hypoglycemia, unspecified: Secondary | ICD-10-CM | POA: Diagnosis not present

## 2023-04-20 DIAGNOSIS — E785 Hyperlipidemia, unspecified: Secondary | ICD-10-CM | POA: Diagnosis not present

## 2023-04-20 DIAGNOSIS — E161 Other hypoglycemia: Secondary | ICD-10-CM | POA: Diagnosis not present

## 2023-04-20 DIAGNOSIS — E1169 Type 2 diabetes mellitus with other specified complication: Secondary | ICD-10-CM | POA: Diagnosis not present

## 2023-05-29 ENCOUNTER — Ambulatory Visit (INDEPENDENT_AMBULATORY_CARE_PROVIDER_SITE_OTHER): Payer: Medicare Other | Admitting: Family Medicine

## 2023-05-29 ENCOUNTER — Encounter: Payer: Self-pay | Admitting: Family Medicine

## 2023-05-29 VITALS — BP 131/74 | HR 67 | Temp 97.0°F | Ht 64.0 in | Wt 171.6 lb

## 2023-05-29 DIAGNOSIS — I152 Hypertension secondary to endocrine disorders: Secondary | ICD-10-CM

## 2023-05-29 DIAGNOSIS — N3281 Overactive bladder: Secondary | ICD-10-CM | POA: Diagnosis not present

## 2023-05-29 DIAGNOSIS — R5383 Other fatigue: Secondary | ICD-10-CM | POA: Diagnosis not present

## 2023-05-29 DIAGNOSIS — E1169 Type 2 diabetes mellitus with other specified complication: Secondary | ICD-10-CM | POA: Diagnosis not present

## 2023-05-29 DIAGNOSIS — K219 Gastro-esophageal reflux disease without esophagitis: Secondary | ICD-10-CM

## 2023-05-29 DIAGNOSIS — E785 Hyperlipidemia, unspecified: Secondary | ICD-10-CM

## 2023-05-29 DIAGNOSIS — E119 Type 2 diabetes mellitus without complications: Secondary | ICD-10-CM

## 2023-05-29 DIAGNOSIS — T466X5A Adverse effect of antihyperlipidemic and antiarteriosclerotic drugs, initial encounter: Secondary | ICD-10-CM | POA: Diagnosis not present

## 2023-05-29 DIAGNOSIS — M609 Myositis, unspecified: Secondary | ICD-10-CM | POA: Diagnosis not present

## 2023-05-29 DIAGNOSIS — J301 Allergic rhinitis due to pollen: Secondary | ICD-10-CM | POA: Diagnosis not present

## 2023-05-29 DIAGNOSIS — E1159 Type 2 diabetes mellitus with other circulatory complications: Secondary | ICD-10-CM | POA: Diagnosis not present

## 2023-05-29 DIAGNOSIS — F3170 Bipolar disorder, currently in remission, most recent episode unspecified: Secondary | ICD-10-CM

## 2023-05-29 LAB — RAPID STREP SCREEN (MED CTR MEBANE ONLY): Strep Gp A Ag, IA W/Reflex: NEGATIVE

## 2023-05-29 LAB — BAYER DCA HB A1C WAIVED: HB A1C (BAYER DCA - WAIVED): 5.2 % (ref 4.8–5.6)

## 2023-05-29 LAB — CULTURE, GROUP A STREP

## 2023-05-29 MED ORDER — PANTOPRAZOLE SODIUM 40 MG PO TBEC: 40.0000 mg | DELAYED_RELEASE_TABLET | Freq: Every day | ORAL | 3 refills | Status: DC

## 2023-05-29 MED ORDER — SERTRALINE HCL 100 MG PO TABS: ORAL_TABLET | ORAL | 3 refills | Status: DC

## 2023-05-29 MED ORDER — EZETIMIBE 10 MG PO TABS: 10.0000 mg | ORAL_TABLET | Freq: Every day | ORAL | 3 refills | Status: DC

## 2023-05-29 MED ORDER — MOMETASONE FUROATE 50 MCG/ACT NA SUSP
2.0000 | Freq: Every day | NASAL | 12 refills | Status: DC
Start: 2023-05-29 — End: 2024-07-30

## 2023-05-29 MED ORDER — ZIPRASIDONE HCL 60 MG PO CAPS: 60.0000 mg | ORAL_CAPSULE | Freq: Every day | ORAL | 3 refills | Status: DC

## 2023-05-29 MED ORDER — MIRABEGRON ER 25 MG PO TB24: 25.0000 mg | ORAL_TABLET | Freq: Every day | ORAL | 3 refills | Status: DC

## 2023-05-29 MED ORDER — DESLORATADINE 5 MG PO TABS
5.0000 mg | ORAL_TABLET | Freq: Every day | ORAL | 3 refills | Status: DC
Start: 2023-05-29 — End: 2024-05-13

## 2023-05-29 MED ORDER — LINZESS 145 MCG PO CAPS: ORAL_CAPSULE | ORAL | 3 refills | Status: DC

## 2023-05-29 NOTE — Progress Notes (Signed)
Subjective: CC:DM PCP: Raliegh Ip, DO OAC:Morgan [name] is a 57 y.o. female presenting to clinic today for:  1. Type 2 Diabetes with hypertension, hyperlipidemia:  Diabetes and BP have been diet controlled since Roux-en-Y gastric bypass. She is statin intolerant but compliant with Zetia.  She has had a couple of blood sugars in the 60s but no other significant hypoglycemic episodes.  Last eye exam: UTD Last foot exam: UTD Last A1c:  Lab Results  Component Value Date   HGBA1C 6.1 (H) 01/27/2023   Nephropathy screen indicated?: UTD Last flu, zoster and/or pneumovax:  Immunization History  Administered Date(s) Administered   Covid-19, Mrna,Vaccine(Spikevax)72yrs and older 12/14/2022   Influenza Inj Mdck Quad With Preservative 09/11/2017   Influenza Split 09/11/2020   Influenza,inj,Quad PF,6+ Mos 09/29/2016, 09/07/2017, 09/11/2018, 09/01/2021, 09/05/2022   Influenza,inj,quad, With Preservative 09/17/2019   Influenza-Unspecified 09/11/2016   Moderna Covid-19 Vaccine Bivalent Booster 35yrs & up 11/23/2021   Moderna Sars-Covid-2 Vaccination 03/02/2020, 03/30/2020, 10/14/2020   PNEUMOCOCCAL CONJUGATE-20 01/26/2022   Pneumococcal Polysaccharide-23 06/29/2016   Td 07/09/2015   Tdap 07/09/2015   Zoster Recombinat (Shingrix) 10/06/2017, 01/04/2018    ROS: No chest pain, shortness of breath but does report some dizziness, fatigue and what feels like hot flashes lately.  She also reports some drainage and glandular irritation in the throat.  No known contact with strep, mono.  She was treated with antibiotics earlier this year for a URI but is not having any type of sputum that is concerning for bacterial infection now.  She reports no measured fevers.  She has been using Zyrtec and Flonase but does not find these are helpful.    ROS: Per HPI  Allergies  Allergen Reactions   Ketoconazole Hives and Swelling    SWELLING REACTION UNSPECIFIED    Pravachol [Pravastatin  Sodium] Shortness Of Breath, Swelling and Anaphylaxis    Throat swelling   Statins Other (See Comments)    Leg cramps   Tape Dermatitis and Other (See Comments)    Steri-Strips   Codeine Hypertension    increased BP   Past Medical History:  Diagnosis Date   Allergic rhinitis    Anemia     after gastric bypass in 2018   Anxiety    Barrett's esophagus    Bipolar affective disorder (HCC)    CAP (community acquired pneumonia) 07/20/2018   Chronic respiratory failure (HCC)    Constipation, chronic    Degenerative joint disease of spine    Depression    Diabetes mellitus without complication (HCC)    type 2    DM (diabetes mellitus) (HCC)    "hypoglycemic" per pt - no longer takes DM meds due to weight loss from gastric bypass in 2018   Dry eye    Family history of adverse reaction to anesthesia    nausea and vomiting   Gastroparesis    GERD (gastroesophageal reflux disease)    H/O shoulder replacement 08/11/2020   left shoulder   History of bariatric surgery 03/2017   History of hiatal hernia    HLD (hyperlipidemia) 03/12/2013   Hyperlipidemia    Hypertension    No HTN meds since weight loss from Gastric Bypass in 2018   Intractable chronic migraine without aura 06/04/2015   Migraine headache    Morbid obesity (HCC)    OSA (obstructive sleep apnea)    No cpap since gastric surgery   Osteoarthritis    bilateral knee   SBO (small bowel obstruction) (HCC) 03/19/2019  Sleep apnea    Unspecified hypothyroidism    Vitamin B 12 deficiency    Vitamin D deficiency     Current Outpatient Medications:    amoxicillin-clavulanate (AUGMENTIN) 875-125 MG tablet, Take 1 tablet by mouth 2 (two) times daily. Take all of this medication (Patient not taking: Reported on 03/22/2023), Disp: 20 tablet, Rfl: 0   Calcium-Vitamin D-Vitamin K 650-12.5-40 MG-MCG-MCG CHEW, Chew 1 each by mouth 2 (two) times daily., Disp: , Rfl:    dicyclomine (BENTYL) 20 MG tablet, TAKE 1 TABLET EVERY 8 HOURS AS  NEEDED FOR ABDOMINAL CRAMPING, Disp: 30 tablet, Rfl: 1   divalproex (DEPAKOTE) 500 MG DR tablet, Take 500 mg by mouth in the morning and 1000 mg at bedtime, Disp: 270 tablet, Rfl: 3   ezetimibe (ZETIA) 10 MG tablet, Take 1 tablet (10 mg total) by mouth daily., Disp: 90 tablet, Rfl: 3   fluticasone (FLONASE) 50 MCG/ACT nasal spray, USE 2 SPRAYS IN EACH NOSTRIL TWICE DAILY AS NEEDED FOR ALLERGY, Disp: 16 g, Rfl: 4   gabapentin (NEURONTIN) 300 MG capsule, Takes 2 capsules in morning, one cap at lunch, 2 caps at bedtime, Disp: 450 capsule, Rfl: 3   Galcanezumab-gnlm (EMGALITY) 120 MG/ML SOAJ, Inject 120 mg into the skin every 30 (thirty) days., Disp: 3 mL, Rfl: 3   Glucagon, rDNA, (GLUCAGON EMERGENCY IJ), Inject 1 Syringe as directed daily as needed (emergency low blood sugar). , Disp: , Rfl:    glucose blood (ONETOUCH ULTRA) test strip, Check BS four times a day Dx E11.9, Disp: 400 strip, Rfl: 3   levothyroxine (SYNTHROID) 112 MCG tablet, TAKE ONE TABLET ONCE DAILY BEFORE BREAKFAST, Disp: 90 tablet, Rfl: 1   LINZESS 145 MCG CAPS capsule, TAKE (1) CAPSULE DAILY BEFORE BREAKFAST., Disp: 90 capsule, Rfl: 3   loratadine (CLARITIN) 10 MG tablet, Take 10 mg by mouth daily as needed for allergies. , Disp: , Rfl:    methocarbamol (ROBAXIN-750) 750 MG tablet, Take 1 tablet (750 mg total) by mouth every 8 (eight) hours as needed for muscle spasms., Disp: 30 tablet, Rfl: 0   mirabegron ER (MYRBETRIQ) 25 MG TB24 tablet, Take 1 tablet (25 mg total) by mouth daily., Disp: 90 tablet, Rfl: 3   Multiple Vitamins-Minerals (BARIATRIC MULTIVITAMINS/IRON PO), Take 1 tablet by mouth daily., Disp: , Rfl:    nystatin (MYCOSTATIN/NYSTOP) powder, APPLY TO AFFECTED AREA OF GROIN RASH TWICE A DAY FOR 7 TO 10 DAYS PER FLARE, Disp: 30 g, Rfl: 1   pantoprazole (PROTONIX) 40 MG tablet, Take 1 tablet (40 mg total) by mouth daily., Disp: 90 tablet, Rfl: 3   polyethylene glycol (MIRALAX / GLYCOLAX) packet, Take 17 g by mouth at bedtime.,  Disp: , Rfl:    predniSONE (DELTASONE) 20 MG tablet, 2 po at same time daily for 5 days, Disp: 10 tablet, Rfl: 0   Probiotic Product (PROBIOTIC DAILY PO), Take 1 capsule by mouth daily., Disp: , Rfl:    Pseudoephedrine-Guaifenesin [phone] MG TB12, Take 1 tablet by mouth 2 (two) times daily. For congestion, Disp: 16 tablet, Rfl: 0   rizatriptan (MAXALT-MLT) 10 MG disintegrating tablet, Take 1 tablet (10 mg total) by mouth as needed (take 1 tablet as needed for migraine. may repeat in 2 hours if needed)., Disp: 10 tablet, Rfl: 2   sertraline (ZOLOFT) 100 MG tablet, TAKE 2 TABLETS DAILY AS DIRECTED, Disp: 180 tablet, Rfl: 3   Simethicone (GAS-X EXTRA STRENGTH) 125 MG CAPS, Take 125 mg by mouth in the morning, at noon, and  at bedtime., Disp: , Rfl:    ziprasidone (GEODON) 60 MG capsule, Take 1 capsule (60 mg total) by mouth at bedtime., Disp: 90 capsule, Rfl: 3 Social History   Socioeconomic History   Marital status: Single    Spouse name: Not on file   Number of children: 0   Years of education: HS   Highest education level: 12th grade  Occupational History   Occupation: disabled    Associate Professor: UNEMPLOYED  Tobacco Use   Smoking status: Never   Smokeless tobacco: Never  Vaping Use   Vaping Use: Never used  Substance and Sexual Activity   Alcohol use: No   Drug use: No   Sexual activity: Never    Birth control/protection: Post-menopausal    Comment: LMP 10/2017  Other Topics Concern   Not on file  Social History Narrative   Patient is right handed.   Patient drinks 2 glasses of caffeine daily.   Lives at home. Her niece is living with her right now.   Social Determinants of Health   Financial Resource Strain: Low Risk  (03/22/2023)   Overall Financial Resource Strain (CARDIA)    Difficulty of Paying Living Expenses: Not hard at all  Food Insecurity: No Food Insecurity (03/22/2023)   Hunger Vital Sign    Worried About Running Out of Food in the Last Year: Never true    Ran Out of  Food in the Last Year: Never true  Transportation Needs: No Transportation Needs (03/22/2023)   PRAPARE - Administrator, Civil Service (Medical): No    Lack of Transportation (Non-Medical): No  Physical Activity: Insufficiently Active (03/22/2023)   Exercise Vital Sign    Days of Exercise per Week: 3 days    Minutes of Exercise per Session: 30 min  Stress: No Stress Concern Present (03/22/2023)   [employer] of Occupational Health - Occupational Stress Questionnaire    Feeling of Stress : Not at all  Social Connections: Moderately Isolated (03/22/2023)   Social Connection and Isolation Panel [NHANES]    Frequency of Communication with Friends and Family: More than three times a week    Frequency of Social Gatherings with Friends and Family: More than three times a week    Attends Religious Services: More than 4 times per year    Active Member of [employer] or Organizations: No    Attends Banker Meetings: Never    Marital Status: Never married  Intimate Partner Violence: Not At Risk (03/22/2023)   Humiliation, Afraid, Rape, and Kick questionnaire    Fear of Current or Ex-Partner: No    Emotionally Abused: No    Physically Abused: No    Sexually Abused: No   Family History  Problem Relation Age of Onset   Asthma Father    Allergies Father    Heart disease Father        enlarged heart   Peripheral vascular disease Father    Diabetes Father    Hyperlipidemia Father    Arthritis Father    Asthma Sister    Cancer Sister        colon at 72 yr old.   Colon cancer Sister    Allergies Mother    Stroke Mother 72       with hemi paralysis   Diabetes Mother    Hyperlipidemia Mother    Hypertension Mother    GI problems Mother    Arthritis Mother    Allergies Brother    Early death  Brother 9       congenital abormality   Allergies Sister    Diabetes Sister    Asthma Sister    Colon polyps Sister    Hyperlipidemia Sister    GI problems Sister         gastroporesis    Liver disease Sister        fatty liver   Stroke Sister        intercrandial bleed   Diabetes Brother    Hypertension Brother    Hyperlipidemia Brother    Heart disease Paternal Aunt    Migraines Neg Hx     Objective: Office vital signs reviewed. BP 131/74   Pulse 67   Temp (!) 97 F (36.1 C)   Ht 5\' 4"  (1.626 m)   Wt 171 lb 9.6 oz (77.8 kg)   LMP 10/17/2017 (Approximate)   SpO2 98%   BMI 29.46 kg/m   Physical Examination:  General: Awake, alert, well nourished, No acute distress HEENT: Normal    Neck: No masses palpated. No lymphadenopathy    Ears: Right TM is cleared by cerumen.  Left TM intact, normal light reflex, no erythema, no bulging    Eyes: PERRLA, extraocular membranes intact, sclera white    Nose: nasal turbinates moist, clear nasal discharge    Throat: moist mucus membranes, no erythema, no tonsillar exudate.  Airway is patent Cardio: regular rate and rhythm, S1S2 heard, no murmurs appreciated Pulm: clear to auscultation bilaterally, no wheezes, rhonchi or rales; normal work of breathing on room air    Assessment/ Plan: 57 y.o. female   Diet-controlled diabetes mellitus (HCC) - Plan: Bayer DCA Hb A1c Waived  Hypertension associated with diabetes (HCC)  Hyperlipidemia associated with type 2 diabetes mellitus (HCC) - Plan: Lipid Panel, ezetimibe (ZETIA) 10 MG tablet  Statin-induced myositis  Seasonal allergic rhinitis due to pollen - Plan: desloratadine (CLARINEX) 5 MG tablet, mometasone (NASONEX) 50 MCG/ACT nasal spray  Fatigue, unspecified type - Plan: TSH, T4, Free, CBC, Rapid Strep Screen ([facility] ONLY)  Overactive bladder - Plan: mirabegron ER (MYRBETRIQ) 25 MG TB24 tablet  Gastroesophageal reflux disease without esophagitis - Plan: pantoprazole (PROTONIX) 40 MG tablet  Bipolar affective disorder in remission (HCC) - Plan: sertraline (ZOLOFT) 100 MG tablet, ziprasidone (GEODON) 60 MG capsule  Sugar controlled with A1c  of 5.2.  Blood pressure remains diet controlled  Continue Zetia for cholesterol control given history of statin induced myositis  Stop Zyrtec, Flonase.  Start Clarinex, Nasonex.  Discussed nasal saline spray and use of humidification she is having dryness at night  Uncertain etiology of fatigue but will evaluate for possible bacterial infection.  Rapid strep collected.  Thyroid labs, CBC collected.  May consider obtaining FSH, LH and estrogen levels given reported hot flashes at some point.  Perhaps she has 2 things going on at once  We did not discuss overactive bladder, GERD or bipolar disorder but medication refills were needed so these were sent  No orders of the defined types were placed in this encounter.  No orders of the defined types were placed in this encounter.    Raliegh Ip, DO [facility] [phone]

## 2023-05-30 LAB — LIPID PANEL
Chol/HDL Ratio: 2.8 ratio (ref 0.0–4.4)
Cholesterol, Total: 211 mg/dL — ABNORMAL HIGH (ref 100–199)
HDL: 75 mg/dL (ref >39)
LDL Chol Calc (NIH): 122 mg/dL — ABNORMAL HIGH (ref 0–99)
Triglycerides: 81 mg/dL (ref 0–149)
VLDL Cholesterol Cal: 14 mg/dL (ref 5–40)

## 2023-05-30 LAB — CBC
Hematocrit: 36.9 % (ref 34.0–46.6)
Hemoglobin: 12 g/dL (ref 11.1–15.9)
MCH: 29.9 pg (ref 26.6–33.0)
MCHC: 32.5 g/dL (ref 31.5–35.7)
MCV: 92 fL (ref 79–97)
Platelets: 228 10*3/uL (ref 150–450)
RBC: 4.01 x10E6/uL (ref 3.77–5.28)
RDW: 12.3 % (ref 11.7–15.4)
WBC: 3.2 10*3/uL — ABNORMAL LOW (ref 3.4–10.8)

## 2023-05-30 LAB — TSH: TSH: 1.02 u[IU]/mL (ref 0.450–4.500)

## 2023-05-30 LAB — T4, FREE: Free T4: 1.02 ng/dL (ref 0.82–1.77)

## 2023-06-08 DIAGNOSIS — K5909 Other constipation: Secondary | ICD-10-CM | POA: Diagnosis not present

## 2023-06-08 DIAGNOSIS — Z8639 Personal history of other endocrine, nutritional and metabolic disease: Secondary | ICD-10-CM | POA: Diagnosis not present

## 2023-06-08 DIAGNOSIS — Z9884 Bariatric surgery status: Secondary | ICD-10-CM | POA: Diagnosis not present

## 2023-06-08 DIAGNOSIS — E161 Other hypoglycemia: Secondary | ICD-10-CM | POA: Diagnosis not present

## 2023-06-23 DIAGNOSIS — E119 Type 2 diabetes mellitus without complications: Secondary | ICD-10-CM | POA: Diagnosis not present

## 2023-06-23 DIAGNOSIS — H40033 Anatomical narrow angle, bilateral: Secondary | ICD-10-CM | POA: Diagnosis not present

## 2023-06-23 LAB — HM DIABETES EYE EXAM

## 2023-07-20 ENCOUNTER — Other Ambulatory Visit: Payer: Self-pay | Admitting: Family Medicine

## 2023-07-20 DIAGNOSIS — E039 Hypothyroidism, unspecified: Secondary | ICD-10-CM

## 2023-08-18 ENCOUNTER — Other Ambulatory Visit: Payer: Self-pay | Admitting: Family Medicine

## 2023-08-18 DIAGNOSIS — N3281 Overactive bladder: Secondary | ICD-10-CM

## 2023-08-18 DIAGNOSIS — M1712 Unilateral primary osteoarthritis, left knee: Secondary | ICD-10-CM | POA: Diagnosis not present

## 2023-08-18 DIAGNOSIS — M25561 Pain in right knee: Secondary | ICD-10-CM | POA: Diagnosis not present

## 2023-08-18 DIAGNOSIS — S93491A Sprain of other ligament of right ankle, initial encounter: Secondary | ICD-10-CM | POA: Diagnosis not present

## 2023-09-11 ENCOUNTER — Other Ambulatory Visit: Payer: Self-pay | Admitting: Family Medicine

## 2023-09-11 DIAGNOSIS — E11649 Type 2 diabetes mellitus with hypoglycemia without coma: Secondary | ICD-10-CM

## 2023-09-11 DIAGNOSIS — E162 Hypoglycemia, unspecified: Secondary | ICD-10-CM

## 2023-10-12 DIAGNOSIS — Z9884 Bariatric surgery status: Secondary | ICD-10-CM | POA: Diagnosis not present

## 2023-10-12 DIAGNOSIS — E162 Hypoglycemia, unspecified: Secondary | ICD-10-CM | POA: Diagnosis not present

## 2023-10-12 DIAGNOSIS — E10649 Type 1 diabetes mellitus with hypoglycemia without coma: Secondary | ICD-10-CM | POA: Diagnosis not present

## 2023-10-12 DIAGNOSIS — E161 Other hypoglycemia: Secondary | ICD-10-CM | POA: Diagnosis not present

## 2023-10-30 ENCOUNTER — Other Ambulatory Visit: Payer: Self-pay | Admitting: Family Medicine

## 2023-10-30 DIAGNOSIS — E162 Hypoglycemia, unspecified: Secondary | ICD-10-CM | POA: Diagnosis not present

## 2023-10-30 DIAGNOSIS — Z7189 Other specified counseling: Secondary | ICD-10-CM | POA: Diagnosis not present

## 2023-10-31 ENCOUNTER — Telehealth: Payer: Self-pay | Admitting: Family Medicine

## 2023-10-31 MED ORDER — RIZATRIPTAN BENZOATE 10 MG PO TBDP: 10.0000 mg | ORAL_TABLET | ORAL | 0 refills | Status: DC | PRN

## 2023-10-31 NOTE — Telephone Encounter (Signed)
At 11:15 Stu from [facility] left vm asking that Amy, NP fills pt's rizatriptan (MAXALT-MLT) 10 MG disintegrating tablet.  If there are questions on this request Stu can be reached at [phone]

## 2023-10-31 NOTE — Telephone Encounter (Signed)
E-scribed refill 

## 2023-11-13 NOTE — Progress Notes (Unsigned)
PATIENT: Morgan [name] DOB: 12-Feb-1966  REASON FOR VISIT: follow up HISTORY FROM: patient  Virtual Visit via Telephone Note  I connected with Morgan [name] on 11/14/23 at  8:00 AM EST by telephone and verified that I am speaking with the correct person using two identifiers.   I discussed the limitations, risks, security and privacy concerns of performing an evaluation and management service by telephone and the availability of in person appointments. I also discussed with the patient that there may be a patient responsible charge related to this service. The patient expressed understanding and agreed to proceed.   History of Present Illness:  11/14/23 ALL ([facility]):  Morgan [name] returns for follow up for migraines. She continues Emgality, divalproex 500/100 and gabapentin 600/300/600. Rizatriptan used for abortive therapy. She continues to do well. She is having about 3-4 headache days a month. Rizatriptan works well. She does note that Emgality seems less effective just prior to next injection. She has sharp pains the day of the injection and 2-3 days afterward. She feels this is mild and wishes to monitor for now. She has a mild tremor of right hand at times.   VPA checked with PCP and normal per patient. CBC and chem reviewed in Epic and unremarkable.   11/01/2022 ALL (Mychart):  Morgan [name] returns for follow up for migraines. She continues divalproex 500/1000, gabapentin 600/300/600, Emgality and rizatriptan. She reports having an increase in headaches after back surgery in 08/2022. She contributes this to pain medications. She may have 1-5 migraines per month. Rizatriptan works well. She is feeling well and without concerns.   10/26/2021 ALL (Mychart): Morgan [name] returns for migraine follow up. She continues divalproex, gabapentin and Emgality. Rizatriptan used for abortive therapy. She reports headaches are fairly well managed. She has had a few more headaches over the past couple of months.  She has 3-5 migraines per month. Rizatriptan works well for abortive therapy. She reports that CBGs have been fluctuating. She is scheduling follow up with PCP and endo to discuss.   10/20/2020 ALL:  Morgan [name] is a 57 y.o. female here today for follow up for migraines. We continues divalproex, gabapentin and Emgality. She was started on rizatriptan for abortive therapy at last follow up. She reports that she is doing very well. She has 2-3 headache days per month on average. Rizatriptan has worked very well for abortive therapy. She is tolerating medications well with no obvious adverse effects. She had left shoulder replacement in 07/2020 and is recovering well.   History (copied from my note on 04/14/2020)  Morgan [name] is a 57 y.o. female here today for follow up of migraines.  She continues divalproex and gabapentin as prescribed.  We switched her to [facility] in February she reports that headaches have decreased significantly in intensity and frequency.  She had 4 headaches in the month of April.  She continues to follow with orthopedics closely for neck pain.  She has been dissipated in physical therapy and received 1 steroid injection.  She does not feel that either were very beneficial.  She has tried dry needling with no success.   History (copied from my note on 01/14/2020)   Morgan [name] is a 57 y.o. female here today for follow up.She continues divalproex 500mg  and 1000mg  at night, gabapentin 600mg  in the am, 300mg  at lunch and 600mg  at bedtime and Ajovy monthly. She does feel that headaches are less severe but continue daily. She feels that pounding headaches are much  less severe. She is tolerating medications well, however, insurance is requiring that she try Emgality. They will no longer pay for Ajovy. Insurance will cover [employer] She is willing to switch medications. She continues to have chronic neck pain managed by orthopedics at this time. She is current treating with steroid  injections.    History (copied from Dr Trevor Mace note on 05/22/2019)   Interval history: 24 headache days a month. Most are migraines. 15 are severe. Ajovy may be helping a little, she has had so much going on she doesn't know if that is contributing. She had emergency surgery in April and recent back surgery. She is taking oxycontin and 325mg  tylenol. She will keep on the Ajovy and when all the issues are calmed down she will call us and discuss nerve blocks.    Observations/Objective:  Generalized: Well developed, in no acute distress  Mentation: Alert oriented to time, place, history taking. Follows all commands speech and language fluent   Assessment and Plan:  57 y.o. year old female  has a past medical history of Allergic rhinitis, Anemia, Anxiety, Barrett's esophagus, Bipolar affective disorder (HCC), CAP (community acquired pneumonia) (07/20/2018), Chronic respiratory failure (HCC), Constipation, chronic, Degenerative joint disease of spine, Depression, Diabetes mellitus without complication (HCC), DM (diabetes mellitus) (HCC), Dry eye, Family history of adverse reaction to anesthesia, Gastroparesis, GERD (gastroesophageal reflux disease), H/O shoulder replacement (08/11/2020), History of bariatric surgery (03/2017), History of hiatal hernia, HLD (hyperlipidemia) (03/12/2013), Hyperlipidemia, Hypertension, Intractable chronic migraine without aura (06/04/2015), Migraine headache, Morbid obesity (HCC), OSA (obstructive sleep apnea), Osteoarthritis, SBO (small bowel obstruction) (HCC) (03/19/2019), Sleep apnea, Unspecified hypothyroidism, Vitamin B 12 deficiency, and Vitamin D deficiency. here with    ICD-10-CM   1. Migraine without aura and without status migrainosus, not intractable  G43.009        Morgan [name] is doing very well, today.  Migraines are well managed on current treatment plan. She will continue gabapentin 600/300/600mg  and Emgality as prescribed. I will have her decrease dose of  divalproex to 500mg  BID to assess ability to wean this medication. She will reach out with progress report in 6-8 weeks. May continue to wean dose if appropriate.  She will continue rizatriptan as needed for abortive therapy.  She will continue to focus on healthy lifestyle habits. Follow-up with care team as advised.  She will return to see me in 1 year for migraine follow-up.  She is aware she may call sooner if needed.  She verbalizes understanding and agreement with this plan.   No orders of the defined types were placed in this encounter.    Meds ordered this encounter  Medications   gabapentin (NEURONTIN) 300 MG capsule    Sig: Takes 2 capsules in morning, one cap at lunch, 2 caps at bedtime    Dispense:  450 capsule    Refill:  3    Order Specific Question:   Supervising Provider    Answer:   Anson Fret [[id number]]   Galcanezumab-gnlm (EMGALITY) 120 MG/ML SOAJ    Sig: Inject 120 mg into the skin every 30 (thirty) days.    Dispense:  3 mL    Refill:  3    Order Specific Question:   Supervising Provider    Answer:   Anson Fret [[id number]]   divalproex (DEPAKOTE) 500 MG DR tablet    Sig: Take 500 mg by mouth in the morning and 1000 mg at bedtime    Dispense:  270 tablet    Refill:  3    Order Specific Question:   Supervising Provider    Answer:   Anson Fret [[id number]]   rizatriptan (MAXALT-MLT) 10 MG disintegrating tablet    Sig: Take 1 tablet (10 mg total) by mouth as needed (take 1 tablet as needed for migraine. may repeat in 2 hours if needed).    Dispense:  10 tablet    Refill:  0    Order Specific Question:   Supervising Provider    Answer:   Anson Fret [id number]      Follow Up Instructions:  I discussed the assessment and treatment plan with the patient. The patient was provided an opportunity to ask questions and all were answered. The patient agreed with the plan and demonstrated an understanding of the instructions.   The patient was advised to  call back or seek an in-person evaluation if the symptoms worsen or if the condition fails to improve as anticipated.  I provided 15 minutes of non-face-to-face time during this encounter. Patient is located at her place of residence, provider is in the office.    Shawnie Dapper, NP

## 2023-11-13 NOTE — Patient Instructions (Signed)
Below is our plan:  We will continue gabapentin 600 in am, 300 at lunch and 600mg  at bedtime and Emgality every month. Reduce dose of divalproex to 500mg  twice daily. Let me know how you are doing in 6-8 weeks and we may continue to wean if appropriate. Continue rizatriptan as needed.   Please make sure you are staying well hydrated. I recommend 50-60 ounces daily. Well balanced diet and regular exercise encouraged. Consistent sleep schedule with 6-8 hours recommended.   Please continue follow up with care team as directed.   Follow up with me in 1 year   You may receive a survey regarding today's visit. I encourage you to leave honest feed back as I do use this information to improve patient care. Thank you for seeing me today!   GENERAL HEADACHE INFORMATION:   Natural supplements: Magnesium Oxide or Magnesium Glycinate 500 mg at bed (up to 800 mg daily) Coenzyme Q10 300 mg in AM Vitamin B2- 200 mg twice a day   Add 1 supplement at a time since even natural supplements can have undesirable side effects. You can sometimes buy supplements cheaper (especially Coenzyme Q10) at www.WebmailGuide.co.za or at [facility].  Migraine with aura: There is increased risk for stroke in women with migraine with aura and a contraindication for the combined contraceptive pill for use by women who have migraine with aura. The risk for women with migraine without aura is lower. However other risk factors like smoking are far more likely to increase stroke risk than migraine. There is a recommendation for no smoking and for the use of OCPs without estrogen such as progestogen only pills particularly for women with migraine with aura.Marland Kitchen People who have migraine headaches with auras may be 3 times more likely to have a stroke caused by a blood clot, compared to migraine patients who don't see auras. Women who take hormone-replacement therapy may be 30 percent more likely to suffer a clot-based stroke than women not taking  medication containing estrogen. Other risk factors like smoking and high blood pressure may be  much more important.    Vitamins and herbs that show potential:   Magnesium: Magnesium (250 mg twice a day or 500 mg at bed) has a relaxant effect on smooth muscles such as blood vessels. Individuals suffering from frequent or daily headache usually have low magnesium levels which can be increase with daily supplementation of 400-750 mg. Three trials found 40-90% average headache reduction  when used as a preventative. Magnesium may help with headaches are aura, the best evidence for magnesium is for migraine with aura is its thought to stop the cortical spreading depression we believe is the pathophysiology of migraine aura.Magnesium also demonstrated the benefit in menstrually related migraine.  Magnesium is part of the messenger system in the serotonin cascade and it is a good muscle relaxant.  It is also useful for constipation which can be a side effect of other medications used to treat migraine. Good sources include nuts, whole grains, and tomatoes. Side Effects: loose stool/diarrhea  Riboflavin (vitamin B 2) 200 mg twice a day. This vitamin assists nerve cells in the production of ATP a principal energy storing molecule.  It is necessary for many chemical reactions in the body.  There have been at least 3 clinical trials of riboflavin using 400 mg per day all of which suggested that migraine frequency can be decreased.  All 3 trials showed significant improvement in over half of migraine sufferers.  The supplement is found  in bread, cereal, milk, meat, and poultry.  Most Americans get more riboflavin than the recommended daily allowance, however riboflavin deficiency is not necessary for the supplements to help prevent headache. Side effects: energizing, green urine   Coenzyme Q10: This is present in almost all cells in the body and is critical component for the conversion of energy.  Recent studies have  shown that a nutritional supplement of CoQ10 can reduce the frequency of migraine attacks by improving the energy production of cells as with riboflavin.  Doses of 150 mg twice a day have been shown to be effective.   Melatonin: Increasing evidence shows correlation between melatonin secretion and headache conditions.  Melatonin supplementation has decreased headache intensity and duration.  It is widely used as a sleep aid.  Sleep is natures way of dealing with migraine.  A dose of 3 mg is recommended to start for headaches including cluster headache. Higher doses up to 15 mg has been reviewed for use in Cluster headache and have been used. The rationale behind using melatonin for cluster is that many theories regarding the cause of Cluster headache center around the disruption of the normal circadian rhythm in the brain.  This helps restore the normal circadian rhythm.   HEADACHE DIET: Foods and beverages which may trigger migraine Note that only 20% of headache patients are food sensitive. You will know if you are food sensitive if you get a headache consistently 20 minutes to 2 hours after eating a certain food. Only cut out a food if it causes headaches, otherwise you might remove foods you enjoy! What matters most for diet is to eat a well balanced healthy diet full of vegetables and low fat protein, and to not miss meals.   Chocolate, other sweets ALL cheeses except cottage and cream cheese Dairy products, yogurt, sour cream, ice cream Liver Meat extracts (Bovril, Marmite, meat tenderizers) Meats or fish which have undergone aging, fermenting, pickling or smoking. These include: Hotdogs,salami,Lox,sausage, mortadellas,smoked salmon, pepperoni, Pickled herring Pods of broad bean (English beans, Chinese pea pods, [place] (fava) beans, lima and navy beans Ripe avocado, ripe banana Yeast extracts or active yeast preparations such as Brewer's or Fleishman's (commercial bakes goods are  permitted) Tomato based foods, pizza (lasagna, etc.)   MSG (monosodium glutamate) is disguised as many things; look for these common aliases: Monopotassium glutamate Autolysed yeast Hydrolysed protein Sodium caseinate "flavorings" "all natural preservatives" Nutrasweet   Avoid all other foods that convincingly provoke headaches.   Resources: The Dizzy [place] Laundry Your Headache Diet, migrainestrong.com  https://zamora-andrews.com/   Caffeine and Migraine For patients that have migraine, caffeine intake more than 3 days per week can lead to dependency and increased migraine frequency. I would recommend cutting back on your caffeine intake as best you can. The recommended amount of caffeine is 200-300 mg daily, although migraine patients may experience dependency at even lower doses. While you may notice an increase in headache temporarily, cutting back will be helpful for headaches in the long run. For more information on caffeine and migraine, visit: https://americanmigrainefoundation.org/resource-library/caffeine-and-migraine/   Headache Prevention Strategies:   1. Maintain a headache diary; learn to identify and avoid triggers.  - This can be a simple note where you log when you had a headache, associated symptoms, and medications used - There are several smartphone apps developed to help track migraines: Migraine Buddy, Migraine Monitor, Curelator N1-Headache App   Common triggers include: Emotional triggers: Emotional/Upset family or friends Emotional/Upset occupation Business reversal/success Anticipation anxiety Crisis-serious Post-crisis  periodNew job/position   Physical triggers: Vacation Day Weekend Strenuous Exercise High Altitude Location New Move Menstrual Day Physical Illness Oversleep/Not enough sleep Weather changes Light: Photophobia or light sesnitivity treatment involves a balance between desensitization and  reduction in overly strong input. Use dark polarized glasses outside, but not inside. Avoid bright or fluorescent light, but do not dim environment to the point that going into a normally lit room hurts. Consider FL-41 tint lenses, which reduce the most irritating wavelengths without blocking too much light.  These can be obtained at axonoptics.com or theraspecs.com Foods: see list above.   2. Limit use of acute treatments (over-the-counter medications, triptans, etc.) to no more than 2 days per week or 10 days per month to prevent medication overuse headache (rebound headache).     3. Follow a regular schedule (including weekends and holidays): Don't skip meals. Eat a balanced diet. 8 hours of sleep nightly. Minimize stress. Exercise 30 minutes per day. Being overweight is associated with a 5 times increased risk of chronic migraine. Keep well hydrated and drink 6-8 glasses of water per day.   4. Initiate non-pharmacologic measures at the earliest onset of your headache. Rest and quiet environment. Relax and reduce stress. Breathe2Relax is a free app that can instruct you on    some simple relaxtion and breathing techniques. Http://Dawnbuse.com is a    free website that provides teaching videos on relaxation.  Also, there are  many apps that   can be downloaded for "mindful" relaxation.  An app called YOGA NIDRA will help walk you through mindfulness. Another app called Calm can be downloaded to give you a structured mindfulness guide with daily reminders and skill development. Headspace for guided meditation Mindfulness Based Stress Reduction Online Course: www.palousemindfulness.com Cold compresses.   5. Don't wait!! Take the maximum allowable dosage of prescribed medication at the first sign of migraine.   6. Compliance:  Take prescribed medication regularly as directed and at the first sign of a migraine.   7. Communicate:  Call your physician when problems arise, especially if your  headaches change, increase in frequency/severity, or become associated with neurological symptoms (weakness, numbness, slurred speech, etc.). Proceed to emergency room if you experience new or worsening symptoms or symptoms do not resolve, if you have new neurologic symptoms or if headache is severe, or for any concerning symptom.   8. Headache/pain management therapies: Consider various complementary methods, including medication, behavioral therapy, psychological counselling, biofeedback, massage therapy, acupuncture, dry needling, and other modalities.  Such measures may reduce the need for medications. Counseling for pain management, where patients learn to function and ignore/minimize their pain, seems to work very well.   9. Recommend changing family's attention and focus away from patient's headaches. Instead, emphasize daily activities. If first question of day is 'How are your headaches/Do you have a headache today?', then patient will constantly think about headaches, thus making them worse. Goal is to re-direct attention away from headaches, toward daily activities and other distractions.   10. Helpful Websites: www.AmericanHeadacheSociety.org PatentHood.ch www.headaches.org TightMarket.nl www.achenet.org

## 2023-11-14 ENCOUNTER — Encounter: Payer: Self-pay | Admitting: Family Medicine

## 2023-11-14 ENCOUNTER — Telehealth (INDEPENDENT_AMBULATORY_CARE_PROVIDER_SITE_OTHER): Payer: Medicare Other | Admitting: Family Medicine

## 2023-11-14 DIAGNOSIS — G43009 Migraine without aura, not intractable, without status migrainosus: Secondary | ICD-10-CM | POA: Diagnosis not present

## 2023-11-14 MED ORDER — RIZATRIPTAN BENZOATE 10 MG PO TBDP: 10.0000 mg | ORAL_TABLET | ORAL | 0 refills | Status: DC | PRN

## 2023-11-14 MED ORDER — GABAPENTIN 300 MG PO CAPS: ORAL_CAPSULE | ORAL | 3 refills | Status: DC

## 2023-11-14 MED ORDER — EMGALITY 120 MG/ML ~~LOC~~ SOAJ: 120.0000 mg | SUBCUTANEOUS | 3 refills | Status: DC

## 2023-11-14 MED ORDER — DIVALPROEX SODIUM 500 MG PO DR TAB: DELAYED_RELEASE_TABLET | ORAL | 3 refills | Status: DC

## 2023-11-29 ENCOUNTER — Ambulatory Visit (INDEPENDENT_AMBULATORY_CARE_PROVIDER_SITE_OTHER): Payer: Medicare Other | Admitting: Family Medicine

## 2023-11-29 ENCOUNTER — Encounter: Payer: Self-pay | Admitting: Family Medicine

## 2023-11-29 VITALS — BP 126/85 | HR 73 | Temp 98.8°F | Ht 64.0 in | Wt 177.0 lb

## 2023-11-29 DIAGNOSIS — K219 Gastro-esophageal reflux disease without esophagitis: Secondary | ICD-10-CM

## 2023-11-29 DIAGNOSIS — E559 Vitamin D deficiency, unspecified: Secondary | ICD-10-CM

## 2023-11-29 DIAGNOSIS — E119 Type 2 diabetes mellitus without complications: Secondary | ICD-10-CM

## 2023-11-29 DIAGNOSIS — I152 Hypertension secondary to endocrine disorders: Secondary | ICD-10-CM | POA: Diagnosis not present

## 2023-11-29 DIAGNOSIS — E785 Hyperlipidemia, unspecified: Secondary | ICD-10-CM | POA: Diagnosis not present

## 2023-11-29 DIAGNOSIS — E1169 Type 2 diabetes mellitus with other specified complication: Secondary | ICD-10-CM | POA: Diagnosis not present

## 2023-11-29 DIAGNOSIS — E039 Hypothyroidism, unspecified: Secondary | ICD-10-CM | POA: Diagnosis not present

## 2023-11-29 DIAGNOSIS — E1159 Type 2 diabetes mellitus with other circulatory complications: Secondary | ICD-10-CM | POA: Diagnosis not present

## 2023-11-29 DIAGNOSIS — J069 Acute upper respiratory infection, unspecified: Secondary | ICD-10-CM | POA: Diagnosis not present

## 2023-11-29 DIAGNOSIS — Z5181 Encounter for therapeutic drug level monitoring: Secondary | ICD-10-CM

## 2023-11-29 DIAGNOSIS — M62830 Muscle spasm of back: Secondary | ICD-10-CM | POA: Diagnosis not present

## 2023-11-29 DIAGNOSIS — E538 Deficiency of other specified B group vitamins: Secondary | ICD-10-CM

## 2023-11-29 DIAGNOSIS — Z20822 Contact with and (suspected) exposure to covid-19: Secondary | ICD-10-CM

## 2023-11-29 LAB — BAYER DCA HB A1C WAIVED: HB A1C (BAYER DCA - WAIVED): 5.8 % — ABNORMAL HIGH (ref 4.8–5.6)

## 2023-11-29 MED ORDER — METHOCARBAMOL 750 MG PO TABS: 750.0000 mg | ORAL_TABLET | Freq: Three times a day (TID) | ORAL | 0 refills | Status: DC | PRN

## 2023-11-29 MED ORDER — NYSTATIN 100000 UNIT/GM EX POWD: CUTANEOUS | 1 refills | Status: DC

## 2023-11-29 NOTE — Progress Notes (Signed)
Subjective: CC:DM PCP: Raliegh Ip, DO ZOX:WRUEA R Morgan [name] is a 57 y.o. female presenting to clinic today for:  1. Type 2 Diabetes with hypertension, hyperlipidemia:  Sugar has been diet controlled.  In fact she has episodes of hypoglycemia.  She is under the care of endocrinology and now has a CGM to monitor blood sugars.  She still has occasional blood sugars in the 40s and 60s but these have become less and less frequent.  Her cholesterol is being managed with Zetia.  Blood pressure has been diet controlled since her gastric bypass  Diabetes Health Maintenance Due  Topic Date Due   HEMOGLOBIN A1C  12/30/2023 (Originally 11/28/2023)   FOOT EXAM  01/28/2024   OPHTHALMOLOGY EXAM  06/22/2024    Last A1c:  Lab Results  Component Value Date   HGBA1C 5.2 05/29/2023    ROS: Denies dizziness, LOC, polyuria, polydipsia, unintended weight loss/gain, foot ulcerations, numbness or tingling in extremities, shortness of breath or chest pain.  2.  Low back pain Patient reports that she bent over to pick up her niece about 2 weeks ago and injured her back.  Symptoms are refractory to Tylenol and she is not able to take NSAIDs secondary to history of Roux-en-Y.  Asking for renewal on her muscle relaxer  3.  URI Patient reports that she has been having a couple day history of congestion, drainage.  No purulence from nares.  No facial tenderness or dental pain.  She denies any hemoptysis, shortness of breath or fevers.  Utilizing her home allergy medicines.  Has had an exposure to COVID and would like to be tested  ROS: Per HPI  Allergies  Allergen Reactions   Ketoconazole Hives and Swelling    SWELLING REACTION UNSPECIFIED    Pravachol [Pravastatin Sodium] Shortness Of Breath, Swelling and Anaphylaxis    Throat swelling   Statins Other (See Comments)    Leg cramps   Tape Dermatitis and Other (See Comments)    Steri-Strips   Codeine Hypertension    increased BP   Past Medical  History:  Diagnosis Date   Allergic rhinitis    Anemia     after gastric bypass in 2018   Anxiety    Barrett's esophagus    Bipolar affective disorder (HCC)    CAP (community acquired pneumonia) 07/20/2018   Chronic respiratory failure (HCC)    Constipation, chronic    Degenerative joint disease of spine    Depression    Diabetes mellitus without complication (HCC)    type 2    DM (diabetes mellitus) (HCC)    "hypoglycemic" per pt - no longer takes DM meds due to weight loss from gastric bypass in 2018   Dry eye    Family history of adverse reaction to anesthesia    nausea and vomiting   Gastroparesis    GERD (gastroesophageal reflux disease)    H/O shoulder replacement 08/11/2020   left shoulder   History of bariatric surgery 03/2017   History of hiatal hernia    HLD (hyperlipidemia) 03/12/2013   Hyperlipidemia    Hypertension    No HTN meds since weight loss from Gastric Bypass in 2018   Intractable chronic migraine without aura 06/04/2015   Migraine headache    Morbid obesity (HCC)    OSA (obstructive sleep apnea)    No cpap since gastric surgery   Osteoarthritis    bilateral knee   SBO (small bowel obstruction) (HCC) 03/19/2019   Sleep apnea  Unspecified hypothyroidism    Vitamin B 12 deficiency    Vitamin D deficiency     Current Outpatient Medications:    Calcium-Vitamin D-Vitamin K 650-12.5-40 MG-MCG-MCG CHEW, Chew 1 each by mouth 2 (two) times daily., Disp: , Rfl:    desloratadine (CLARINEX) 5 MG tablet, Take 1 tablet (5 mg total) by mouth daily. For allergies, Disp: 90 tablet, Rfl: 3   dicyclomine (BENTYL) 20 MG tablet, TAKE 1 TABLET EVERY 8 HOURS AS NEEDED FOR ABDOMINAL CRAMPING, Disp: 30 tablet, Rfl: 1   divalproex (DEPAKOTE) 500 MG DR tablet, Take 500 mg by mouth in the morning and 1000 mg at bedtime, Disp: 270 tablet, Rfl: 3   ezetimibe (ZETIA) 10 MG tablet, Take 1 tablet (10 mg total) by mouth daily., Disp: 90 tablet, Rfl: 3   gabapentin (NEURONTIN) 300 MG  capsule, Takes 2 capsules in morning, one cap at lunch, 2 caps at bedtime, Disp: 450 capsule, Rfl: 3   Galcanezumab-gnlm (EMGALITY) 120 MG/ML SOAJ, Inject 120 mg into the skin every 30 (thirty) days., Disp: 3 mL, Rfl: 3   Glucagon, rDNA, (GLUCAGON EMERGENCY IJ), Inject 1 Syringe as directed daily as needed (emergency low blood sugar). , Disp: , Rfl:    glucose blood (ONETOUCH ULTRA) test strip, Check BS four times a day Dx E11.9, Disp: 400 strip, Rfl: 3   levothyroxine (SYNTHROID) 112 MCG tablet, TAKE ONE TABLET ONCE DAILY BEFORE BREAKFAST, Disp: 90 tablet, Rfl: 3   LINZESS 145 MCG CAPS capsule, TAKE (1) CAPSULE DAILY BEFORE BREAKFAST., Disp: 90 capsule, Rfl: 3   loratadine (CLARITIN) 10 MG tablet, Take 10 mg by mouth daily as needed for allergies. , Disp: , Rfl:    mirabegron ER (MYRBETRIQ) 25 MG TB24 tablet, Take 1 tablet (25 mg total) by mouth daily., Disp: 90 tablet, Rfl: 3   mometasone (NASONEX) 50 MCG/ACT nasal spray, Place 2 sprays into the nose daily. To replace flonase, Disp: 1 each, Rfl: 12   Multiple Vitamins-Minerals (BARIATRIC MULTIVITAMINS/IRON PO), Take 1 tablet by mouth daily., Disp: , Rfl:    nystatin (MYCOSTATIN/NYSTOP) powder, APPLY TO AFFECTED AREA OF GROIN RASH TWICE A DAY FOR 7 TO 10 DAYS PER FLARE, Disp: 30 g, Rfl: 1   pantoprazole (PROTONIX) 40 MG tablet, Take 1 tablet (40 mg total) by mouth daily., Disp: 90 tablet, Rfl: 3   polyethylene glycol (MIRALAX / GLYCOLAX) packet, Take 17 g by mouth at bedtime., Disp: , Rfl:    Probiotic Product (PROBIOTIC DAILY PO), Take 1 capsule by mouth daily., Disp: , Rfl:    rizatriptan (MAXALT-MLT) 10 MG disintegrating tablet, Take 1 tablet (10 mg total) by mouth as needed (take 1 tablet as needed for migraine. may repeat in 2 hours if needed)., Disp: 10 tablet, Rfl: 0   sertraline (ZOLOFT) 100 MG tablet, TAKE 2 TABLETS DAILY AS DIRECTED, Disp: 180 tablet, Rfl: 3   Simethicone (GAS-X EXTRA STRENGTH) 125 MG CAPS, Take 125 mg by mouth in the  morning, at noon, and at bedtime., Disp: , Rfl:    ziprasidone (GEODON) 60 MG capsule, Take 1 capsule (60 mg total) by mouth at bedtime., Disp: 90 capsule, Rfl: 3   methocarbamol (ROBAXIN-750) 750 MG tablet, Take 1 tablet (750 mg total) by mouth every 8 (eight) hours as needed for muscle spasms., Disp: 30 tablet, Rfl: 0 Social History   Socioeconomic History   Marital status: Single    Spouse name: Not on file   Number of children: 0   Years of education: HS  Highest education level: 12th grade  Occupational History   Occupation: disabled    Employer: UNEMPLOYED  Tobacco Use   Smoking status: Never   Smokeless tobacco: Never  Vaping Use   Vaping status: Never Used  Substance and Sexual Activity   Alcohol use: No   Drug use: No   Sexual activity: Never    Birth control/protection: Post-menopausal    Comment: LMP 10/2017  Other Topics Concern   Not on file  Social History Narrative   Patient is right handed.   Patient drinks 2 glasses of caffeine daily.   Lives at home. Her niece is living with her right now.   Social Drivers of Corporate investment banker Strain: Low Risk  (03/22/2023)   Overall Financial Resource Strain (CARDIA)    Difficulty of Paying Living Expenses: Not hard at all  Food Insecurity: No Food Insecurity (03/22/2023)   Hunger Vital Sign    Worried About Running Out of Food in the Last Year: Never true    Ran Out of Food in the Last Year: Never true  Transportation Needs: No Transportation Needs (03/22/2023)   PRAPARE - Administrator, Civil Service (Medical): No    Lack of Transportation (Non-Medical): No  Physical Activity: Insufficiently Active (03/22/2023)   Exercise Vital Sign    Days of Exercise per Week: 3 days    Minutes of Exercise per Session: 30 min  Stress: No Stress Concern Present (03/22/2023)   [employer] of Occupational Health - Occupational Stress Questionnaire    Feeling of Stress : Not at all  Social Connections:  Moderately Isolated (03/22/2023)   Social Connection and Isolation Panel [NHANES]    Frequency of Communication with Friends and Family: More than three times a week    Frequency of Social Gatherings with Friends and Family: More than three times a week    Attends Religious Services: More than 4 times per year    Active Member of [employer] or Organizations: No    Attends Banker Meetings: Never    Marital Status: Never married  Intimate Partner Violence: Not At Risk (03/22/2023)   Humiliation, Afraid, Rape, and Kick questionnaire    Fear of Current or Ex-Partner: No    Emotionally Abused: No    Physically Abused: No    Sexually Abused: No   Family History  Problem Relation Age of Onset   Asthma Father    Allergies Father    Heart disease Father        enlarged heart   Peripheral vascular disease Father    Diabetes Father    Hyperlipidemia Father    Arthritis Father    Asthma Sister    Cancer Sister        colon at 50 yr old.   Colon cancer Sister    Allergies Mother    Stroke Mother 24       with hemi paralysis   Diabetes Mother    Hyperlipidemia Mother    Hypertension Mother    GI problems Mother    Arthritis Mother    Allergies Brother    Early death Brother 48       congenital abormality   Allergies Sister    Diabetes Sister    Asthma Sister    Colon polyps Sister    Hyperlipidemia Sister    GI problems Sister        gastroporesis    Liver disease Sister  fatty liver   Stroke Sister        intercrandial bleed   Diabetes Brother    Hypertension Brother    Hyperlipidemia Brother    Heart disease Paternal Aunt    Migraines Neg Hx     Objective: Office vital signs reviewed. BP 126/85   Pulse 73   Temp 98.8 F (37.1 C)   Ht 5\' 4"  (1.626 m)   Wt 177 lb (80.3 kg)   LMP 10/17/2017 (Approximate)   SpO2 97%   BMI 30.38 kg/m   Physical Examination:  General: Awake, alert, well nourished, No acute distress HEENT: Normal    Neck: No masses  palpated.  Mildly enlarged left anterior cervical lymph node    Ears: Tympanic membranes intact, normal light reflex, no erythema, no bulging    Eyes: PERRLA, extraocular membranes intact, sclera white    Nose: nasal turbinates moist, clear nasal discharge    Throat: moist mucus membranes, no erythema, no tonsillar exudate.  Airway is patent Cardio: regular rate and rhythm, S1S2 heard, no murmurs appreciated Pulm: clear to auscultation bilaterally, no wheezes, rhonchi or rales; normal work of breathing on room air   Assessment/ Plan: 57 y.o. female   Type 2 diabetes mellitus with other specified complication, without long-term current use of insulin (HCC) - Plan: Bayer DCA Hb A1c Waived, CMP14+EGFR  Hyperlipidemia associated with type 2 diabetes mellitus (HCC) - Plan: Lipid Panel, CMP14+EGFR  Hypertension associated with diabetes (HCC) - Plan: CMP14+EGFR  Acquired hypothyroidism - Plan: TSH, T4, Free  Viral URI - Plan: Novel Coronavirus, NAA ([facility])  Close exposure to COVID-19 virus - Plan: Novel Coronavirus, NAA ([facility])  Lumbar paraspinal muscle spasm - Plan: methocarbamol (ROBAXIN-750) 750 MG tablet  Medication monitoring encounter - Plan: Valproic acid level, CANCELED: Valproic acid level  Sugar remains controlled with A1c of 5.8.  Continue CGM.  Fasting labs collected today.  Continue current regimen.  Blood pressure diet controlled.  Check thyroid levels.  Suspect viral URI but will evaluate for COVID given reports of exposure.  Outside of the treatment window  Robaxin renewed for paraspinal muscle spasm.  I have added Depakote level and she will return to have this drawn separately.  Will CC to neurology  Raliegh Ip, DO [facility] [phone]

## 2023-11-30 ENCOUNTER — Encounter: Payer: Self-pay | Admitting: Family Medicine

## 2023-11-30 LAB — CMP14+EGFR
ALT: 13 [IU]/L (ref 0–32)
AST: 25 [IU]/L (ref 0–40)
Albumin: 3.9 g/dL (ref 3.8–4.9)
Alkaline Phosphatase: 69 [IU]/L (ref 44–121)
BUN/Creatinine Ratio: 23 (ref 9–23)
BUN: 16 mg/dL (ref 6–24)
Bilirubin Total: <0.2 mg/dL (ref 0.0–1.2)
CO2: 25 mmol/L (ref 20–29)
Calcium: 8.9 mg/dL (ref 8.7–10.2)
Chloride: 100 mmol/L (ref 96–106)
Creatinine, Ser: 0.69 mg/dL (ref 0.57–1.00)
Globulin, Total: 2.2 g/dL (ref 1.5–4.5)
Glucose: 84 mg/dL (ref 70–99)
Potassium: 5.2 mmol/L (ref 3.5–5.2)
Sodium: 144 mmol/L (ref 134–144)
Total Protein: 6.1 g/dL (ref 6.0–8.5)
eGFR: 102 mL/min/{1.73_m2} (ref >59)

## 2023-11-30 LAB — LIPID PANEL
Chol/HDL Ratio: 2.7 {ratio} (ref 0.0–4.4)
Cholesterol, Total: 211 mg/dL — ABNORMAL HIGH (ref 100–199)
HDL: 77 mg/dL (ref >39)
LDL Chol Calc (NIH): 121 mg/dL — ABNORMAL HIGH (ref 0–99)
Triglycerides: 73 mg/dL (ref 0–149)
VLDL Cholesterol Cal: 13 mg/dL (ref 5–40)

## 2023-11-30 LAB — VALPROIC ACID LEVEL: Valproic Acid Lvl: 50 ug/mL (ref 50–100)

## 2023-11-30 LAB — TSH: TSH: 2.21 u[IU]/mL (ref 0.450–4.500)

## 2023-11-30 LAB — NOVEL CORONAVIRUS, NAA: SARS-CoV-2, NAA: NOT DETECTED

## 2023-11-30 LAB — T4, FREE: Free T4: 1.16 ng/dL (ref 0.82–1.77)

## 2023-12-01 ENCOUNTER — Encounter: Payer: Self-pay | Admitting: Family Medicine

## 2023-12-15 ENCOUNTER — Other Ambulatory Visit: Payer: Self-pay | Admitting: Family Medicine

## 2023-12-15 ENCOUNTER — Encounter: Payer: Self-pay | Admitting: Family Medicine

## 2023-12-15 DIAGNOSIS — M62830 Muscle spasm of back: Secondary | ICD-10-CM

## 2023-12-15 NOTE — Telephone Encounter (Signed)
 Last office visit 11/29/23 Last refill 11/29/23 #30, no refills

## 2023-12-25 DIAGNOSIS — E162 Hypoglycemia, unspecified: Secondary | ICD-10-CM | POA: Diagnosis not present

## 2023-12-25 DIAGNOSIS — Z9884 Bariatric surgery status: Secondary | ICD-10-CM | POA: Diagnosis not present

## 2023-12-25 DIAGNOSIS — E161 Other hypoglycemia: Secondary | ICD-10-CM | POA: Diagnosis not present

## 2023-12-29 ENCOUNTER — Other Ambulatory Visit: Payer: Self-pay | Admitting: Family Medicine

## 2023-12-29 DIAGNOSIS — Z1231 Encounter for screening mammogram for malignant neoplasm of breast: Secondary | ICD-10-CM | POA: Diagnosis not present

## 2024-01-16 ENCOUNTER — Encounter: Payer: Self-pay | Admitting: Family Medicine

## 2024-01-22 ENCOUNTER — Other Ambulatory Visit: Payer: Self-pay | Admitting: Family Medicine

## 2024-01-22 DIAGNOSIS — M62830 Muscle spasm of back: Secondary | ICD-10-CM

## 2024-01-27 ENCOUNTER — Encounter: Payer: Self-pay | Admitting: Family Medicine

## 2024-01-27 ENCOUNTER — Telehealth: Payer: Medicare Other | Admitting: Physician Assistant

## 2024-01-27 DIAGNOSIS — J111 Influenza due to unidentified influenza virus with other respiratory manifestations: Secondary | ICD-10-CM | POA: Diagnosis not present

## 2024-01-27 MED ORDER — BENZONATATE 100 MG PO CAPS: 100.0000 mg | ORAL_CAPSULE | Freq: Two times a day (BID) | ORAL | 0 refills | Status: DC | PRN

## 2024-01-27 MED ORDER — OSELTAMIVIR PHOSPHATE 75 MG PO CAPS: 75.0000 mg | ORAL_CAPSULE | Freq: Two times a day (BID) | ORAL | 0 refills | Status: AC

## 2024-01-27 NOTE — Progress Notes (Signed)

## 2024-01-27 NOTE — Progress Notes (Signed)
 I have spent 5 minutes in review of e-visit questionnaire, review and updating patient chart, medical decision making and response to patient.   Laure Kidney, PA-C

## 2024-01-28 ENCOUNTER — Encounter: Payer: Self-pay | Admitting: Family Medicine

## 2024-02-12 ENCOUNTER — Other Ambulatory Visit: Payer: Self-pay | Admitting: Family Medicine

## 2024-02-12 DIAGNOSIS — M62830 Muscle spasm of back: Secondary | ICD-10-CM

## 2024-03-18 ENCOUNTER — Ambulatory Visit (INDEPENDENT_AMBULATORY_CARE_PROVIDER_SITE_OTHER): Admitting: Family Medicine

## 2024-03-18 ENCOUNTER — Encounter: Payer: Self-pay | Admitting: Family Medicine

## 2024-03-18 ENCOUNTER — Ambulatory Visit: Payer: Self-pay | Admitting: *Deleted

## 2024-03-18 VITALS — BP 118/73 | HR 71 | Temp 97.9°F | Ht 64.0 in | Wt 170.0 lb

## 2024-03-18 DIAGNOSIS — L723 Sebaceous cyst: Secondary | ICD-10-CM | POA: Diagnosis not present

## 2024-03-18 DIAGNOSIS — L02212 Cutaneous abscess of back [any part, except buttock]: Secondary | ICD-10-CM

## 2024-03-18 DIAGNOSIS — L089 Local infection of the skin and subcutaneous tissue, unspecified: Secondary | ICD-10-CM | POA: Diagnosis not present

## 2024-03-18 MED ORDER — TRAMADOL HCL 50 MG PO TABS: 50.0000 mg | ORAL_TABLET | Freq: Four times a day (QID) | ORAL | 0 refills | Status: AC | PRN

## 2024-03-18 MED ORDER — AMOXICILLIN-POT CLAVULANATE 875-125 MG PO TABS: 1.0000 | ORAL_TABLET | Freq: Two times a day (BID) | ORAL | 0 refills | Status: DC

## 2024-03-18 NOTE — Telephone Encounter (Signed)
  Chief Complaint: cyst- upper back Symptoms: golf ball sized cyst- upper back- red,painful Frequency: started changing Friday Pertinent Negatives: Patient denies open,drainage Disposition: [] ED /[] Urgent Care (no appt availability in office) / [x] Appointment(In office/virtual)/ []  [facility]/ [] Home Care/ [] Refused Recommended Disposition /[] [facility]/ []  Follow-up with PCP Additional Notes: Appointment scheduled  Copied from CRM [id number]. Topic: Clinical - Red Word Triage >> Mar 18, 2024  8:04 AM Franchot Heidelberg wrote: Red Word that prompted transfer to Nurse Triage: Bright red cyst on back, painful, getting worse. Diabetic. Reason for Disposition  SEVERE pain (e.g., excruciating)  Answer Assessment - Initial Assessment Questions 1. APPEARANCE of BOIL: "What does the boil look like?"      Red irritated- swelling 2. LOCATION: "Where is the boil located?"      Upper R 3. NUMBER: "How many boils are there?"      1 4. SIZE: "How big is the boil?" (e.g., inches, cm; compare to size of a coin or other object)     Golf ball size 5. ONSET: "When did the boil start?"     Has been there for a while- Friday night became inflamed 6. PAIN: "Is there any pain?" If Yes, ask: "How bad is the pain?"   (Scale 1-10; or mild, moderate, severe)     Yes- 8/10 7. FEVER: "Do you have a fever?" If Yes, ask: "What is it, how was it measured, and when did it start?"      No- will get hot at time  Protocols used: Boil (Skin Abscess)-A-AH

## 2024-03-18 NOTE — Progress Notes (Signed)
 Chief Complaint  Patient presents with   Cyst    Cyst on back that has been there for  awhile. Golf ball size, red, and painful. No drainage.     HPI  Patient presents today for infected sebaceous cyst. Painful, mid back increasing in size for 4-5 days. Had to have it lanced once a few years ago.   PMH: Smoking status noted Review of Systems  Objective: BP 118/73   Pulse 71   Temp 97.9 F (36.6 C)   Ht 5\' 4"  (1.626 m)   Wt 170 lb (77.1 kg)   LMP 10/17/2017 (Approximate)   SpO2 95%   BMI 29.18 kg/m  Gen: NAD, alert, cooperative with exam HEENT: NCAT, EOMI, PERRL CV: RRR, good S1/S2, no murmur Resp: CTABL, no wheezes, non-labored Skin: 3 cm raised fluctuant mass with 2 cm surrounding erythema. The lesion is erythematous as well. It is tender to touch.  The pt was prepped with betadine and draped in sterile fashion. Local anesthesia obtained using 4 cc 2% lidocaine with epi. A straight 2 cm incision was made over the lesion following the skin lines. The lesion was then lanced and drained of copious pus and sebum. A nu gauze wick was inserted . This was covered with plain guaze and tape dressing.  Wound care reviewed. Signs and symptoms of infection also reviewed with pt / significant other.  Neuro: Alert and oriented, No gross deficits  Cutaneous abscess of back excluding buttocks  Infected sebaceous cyst of skin  Other orders -     Amoxicillin-Pot Clavulanate; Take 1 tablet by mouth 2 (two) times daily. Take all of this medication  Dispense: 20 tablet; Refill: 0 -     traMADol HCl; Take 1 tablet (50 mg total) by mouth 4 (four) times daily as needed for up to 5 days for moderate pain (pain score 4-6).  Dispense: 20 tablet; Refill: 0

## 2024-03-18 NOTE — Telephone Encounter (Signed)
 Appt made

## 2024-03-20 ENCOUNTER — Ambulatory Visit (INDEPENDENT_AMBULATORY_CARE_PROVIDER_SITE_OTHER): Admitting: Family Medicine

## 2024-03-20 ENCOUNTER — Encounter: Payer: Self-pay | Admitting: Family Medicine

## 2024-03-20 VITALS — BP 139/82 | HR 66 | Temp 97.7°F | Ht 64.0 in | Wt 167.0 lb

## 2024-03-20 DIAGNOSIS — L089 Local infection of the skin and subcutaneous tissue, unspecified: Secondary | ICD-10-CM

## 2024-03-20 DIAGNOSIS — L723 Sebaceous cyst: Secondary | ICD-10-CM

## 2024-03-20 NOTE — Progress Notes (Signed)
 Subjective:  Patient ID: Morgan [name], female    DOB: 08/22/1966  Age: 58 y.o. MRN: [id number]  CC: Wound Check (Doing okay and less sore today. It is draining. )   HPI [name] presents for abscess of the upper back.  This was incised and drained and irrigated 2 days ago.  Shonna Chock had been placed.  She is here today for reevaluation for progress of healing.  She says it is hurting much less.  No complaints.  She has not changed the dressing.  This is as directed.     03/18/2024    9:45 AM 11/29/2023    8:22 AM 05/29/2023    8:05 AM  Depression screen PHQ 2/9  Decreased Interest 2 1 3   Down, Depressed, Hopeless 3 3 2   PHQ - 2 Score 5 4 5   Altered sleeping 1 1 3   Tired, decreased energy 2 1 3   Change in appetite 0 1 0  Feeling bad or failure about yourself  3 1 2   Trouble concentrating 2 1 2   Moving slowly or fidgety/restless 0 0 0  Suicidal thoughts 0 0 0  PHQ-9 Score 13 9 15   Difficult doing work/chores Somewhat difficult Somewhat difficult Somewhat difficult    History Rogan has a past medical history of Allergic rhinitis, Anemia, Anxiety, Barrett's esophagus, Bipolar affective disorder (HCC), CAP (community acquired pneumonia) (07/20/2018), Chronic respiratory failure (HCC), Constipation, chronic, Degenerative joint disease of spine, Depression, Diabetes mellitus without complication (HCC), DM (diabetes mellitus) (HCC), Dry eye, Family history of adverse reaction to anesthesia, Gastroparesis, GERD (gastroesophageal reflux disease), H/O shoulder replacement (08/11/2020), History of bariatric surgery (03/2017), History of hiatal hernia, HLD (hyperlipidemia) (03/12/2013), Hyperlipidemia, Hypertension, Intractable chronic migraine without aura (06/04/2015), Migraine headache, Morbid obesity (HCC), OSA (obstructive sleep apnea), Osteoarthritis, SBO (small bowel obstruction) (HCC) (03/19/2019), Sleep apnea, Unspecified hypothyroidism, Vitamin B 12 deficiency, and Vitamin D deficiency.    She has a past surgical history that includes Cholecystectomy (2005); Appendectomy (1978); Tonsillectomy (at age 7); Shoulder arthroscopy (06/2011); Trigger finger release (12/20/2012); Gastric Roux-En-Y (N/A, 03/21/2017); laparoscopy (N/A, 06/19/2018); Gastrostomy (N/A, 06/19/2018); Hiatal hernia repair (N/A, 06/19/2018); Gastrostomy tube placement (Left); Colonoscopy; Lumbar laminectomy/decompression microdiscectomy (Right, 10/11/2018); laparoscopy (N/A, 03/19/2019); Laparoscopic lysis of adhesions (03/19/2019); disc repair (05/17/2019); Ankle arthroscopy with reconstruction (Right, 09/24/2019); Total shoulder arthroplasty (Left, 08/11/2020); Mass excision (Right, 09/23/2021); and Trigger finger release (Right, 09/23/2021).   Her family history includes Allergies in her brother, father, mother, and sister; Arthritis in her father and mother; Asthma in her father, sister, and sister; Cancer in her sister; Colon cancer in her sister; Colon polyps in her sister; Diabetes in her brother, father, mother, and sister; Early death (age of onset: 69) in her brother; GI problems in her mother and sister; Heart disease in her father and paternal aunt; Hyperlipidemia in her brother, father, mother, and sister; Hypertension in her brother and mother; Liver disease in her sister; Peripheral vascular disease in her father; Stroke in her sister; Stroke (age of onset: 81) in her mother.She reports that she has never smoked. She has never used smokeless tobacco. She reports that she does not drink alcohol and does not use drugs.    ROS Review of Systems  Objective:  BP 139/82   Pulse 66   Temp 97.7 F (36.5 C)   Ht 5\' 4"  (1.626 m)   Wt 167 lb (75.8 kg)   LMP 10/17/2017 (Approximate)   SpO2 94%   BMI 28.67 kg/m   BP Readings from Last  3 Encounters:  03/20/24 139/82  03/18/24 118/73  11/29/23 126/85    Wt Readings from Last 3 Encounters:  03/20/24 167 lb (75.8 kg)  03/18/24 170 lb (77.1 kg)  11/29/23  177 lb (80.3 kg)     Physical Exam Skin:    General: Skin is warm and dry.     Findings: Lesion present.   The bandage was removed from the mid thoracic region of the back near the spine on the left side.  This revealed a 3 cm slightly raised, swollen erythematous lesion with a central cut from the lancing procedure.  The wick is in place and was removed without difficulty.  It was coated with purulent matter.  Minimal sebum could be expressed from the wound.  It was then covered with Neosporin and 4 x 4 gauze.  She should start changing it daily and can wash it with warm soapy water rinse with clear water.  Do not submerge.  Apply Neosporin and gauze.   Assessment & Plan:  Infected sebaceous cyst of skin   Wound care reviewed.  Signs and symptoms of infection reviewed as well.  Continue the antibiotics until they are gone.  Follow-up: Return if signs of infection occur, as reviewed.  Mechele Claude, M.D.

## 2024-03-22 ENCOUNTER — Ambulatory Visit (INDEPENDENT_AMBULATORY_CARE_PROVIDER_SITE_OTHER)

## 2024-03-22 VITALS — BP 135/80 | Ht 64.0 in | Wt 167.0 lb

## 2024-03-22 DIAGNOSIS — Z0000 Encounter for general adult medical examination without abnormal findings: Secondary | ICD-10-CM

## 2024-03-22 NOTE — Patient Instructions (Signed)
 Morgan [name] , Thank you for taking time to come for your Medicare Wellness Visit. I appreciate your ongoing commitment to your health goals. Please review the following plan we discussed and let me know if I can assist you in the future.   Please see any Referrals/Orders/Follow-Ups/Clinician Recommendations Below: Next Medicare Annual Wellness Visit:March 28, 2025 at 1:10pm video visit.  Please have your Diabetic Urine Kidney Screen completed at [facility]. You do not have to schedule an appointment for this   Clinician Recommendations: Aim for 30 minutes of exercise or brisk walking, 6-8 glasses of water, and 5 servings of fruits and vegetables each day.  This is a list of the screening recommended for you and due dates:  Health Maintenance  Topic Date Due   COVID-19 Vaccine (6 - 2024-25 season) 08/13/2023   Yearly kidney health urinalysis for diabetes  01/28/2024   Complete foot exam   01/28/2024   Pap with HPV screening  11/28/2024*   Hemoglobin A1C  05/29/2024   Eye exam for diabetics  06/22/2024   Flu Shot  07/12/2024   Yearly kidney function blood test for diabetes  11/28/2024   Mammogram  12/28/2024   Medicare Annual Wellness Visit  03/22/2025   DTaP/Tdap/Td vaccine (3 - Td or Tdap) 07/08/2025   Colon Cancer Screening  12/15/2025   Pneumococcal Vaccination  Completed   Hepatitis C Screening  Completed   HIV Screening  Completed   Zoster (Shingles) Vaccine  Completed   HPV Vaccine  Aged Out   Meningitis B Vaccine  Aged Out  *Topic was postponed. The date shown is not the original due date.    Advanced directives: (In Chart) A copy of your advanced directives are scanned into your chart should your provider ever need it.  Advance Care Planning is important because it:  [x]  Makes sure you receive the medical care that is consistent with your values, goals, and preferences  [x]  It provides guidance to your family and loved ones and it also reduces  their decisional burden about whether or not they are making the right decisions based on what you want done  Follow the link provided in your after visit summary or read over the paperwork we have mailed to you to help you started getting your Advance Directives in place. If you need assistance in completing these, please reach out to [place] so that we can help you!   Next Medicare Annual Wellness Visit scheduled for next year: yes  Understanding Your Risk for Falls Millions of people have serious injuries from falls each year. It is important to understand your risk of falling. Talk with your health care provider about your risk and what you can do to lower it. If you do have a serious fall, make sure to tell your provider. Falling once raises your risk of falling again. How can falls affect me? Serious injuries from falls are common. These include: Broken bones, such as hip fractures. Head injuries, such as traumatic brain injuries (TBI) or concussions. A fear of falling can cause you to avoid activities and stay at home. This can make your muscles weaker and raise your risk for a fall. What can increase my risk? There are a number of risk factors that increase your risk for falling. The more risk factors you have, the higher your risk of falling. Serious injuries from a fall happen most often to people who are older than 57 years old. Teenagers and young adults ages 75-29  are also at higher risk. Common risk factors include: Weakness in the lower body. Being generally weak or confused due to long-term (chronic) illness. Dizziness or balance problems. Poor vision. Medicines that cause dizziness or drowsiness. These may include: Medicines for your blood pressure, heart, anxiety, insomnia, or swelling (edema). Pain medicines. Muscle relaxants. Other risk factors include: Drinking alcohol. Having had a fall in the past. Having foot pain or wearing improper footwear. Working at a dangerous  job. Having any of the following in your home: Tripping hazards, such as floor clutter or loose rugs. Poor lighting. Pets. Having dementia or memory loss. What actions can I take to lower my risk of falling?     Physical activity Stay physically fit. Do strength and balance exercises. Consider taking a regular class to build strength and balance. Yoga and tai chi are good options. Vision Have your eyes checked every year and your prescription for glasses or contacts updated as needed. Shoes and walking aids Wear non-skid shoes. Wear shoes that have rubber soles and low heels. Do not wear high heels. Do not walk around the house in socks or slippers. Use a cane or walker as told by your provider. Home safety Attach secure railings on both sides of your stairs. Install grab bars for your bathtub, shower, and toilet. Use a non-skid mat in your bathtub or shower. Attach bath mats securely with double-sided, non-slip rug tape. Use good lighting in all rooms. Keep a flashlight near your bed. Make sure there is a clear path from your bed to the bathroom. Use night-lights. Do not use throw rugs. Make sure all carpeting is taped or tacked down securely. Remove all clutter from walkways and stairways, including extension cords. Repair uneven or broken steps and floors. Avoid walking on icy or slippery surfaces. Walk on the grass instead of on icy or slick sidewalks. Use ice melter to get rid of ice on walkways in the winter. Use a cordless phone. Questions to ask your health care provider Can you help me check my risk for a fall? Do any of my medicines make me more likely to fall? Should I take a vitamin D supplement? What exercises can I do to improve my strength and balance? Should I make an appointment to have my vision checked? Do I need a bone density test to check for weak bones (osteoporosis)? Would it help to use a cane or a walker? Where to find more information Centers for  Disease Control and Prevention, [employer]: TonerPromos.no Community-Based Fall Prevention Programs: TonerPromos.no [employer] on Aging: BaseRingTones.pl Contact a health care provider if: You fall at home. You are afraid of falling at home. You feel weak, drowsy, or dizzy. This information is not intended to replace advice given to you by your health care provider. Make sure you discuss any questions you have with your health care provider. Document Revised: 08/01/2022 Document Reviewed: 08/01/2022 Elsevier Patient Education  2024 [employer]  Managing Pain Without Opioids  Opioids are strong medicines used to treat moderate to severe pain. For some people, especially those who have long-term (chronic) pain, opioids may not be the best choice for pain management due to: Side effects like nausea, constipation, and sleepiness. The risk of addiction (opioid use disorder). The longer you take opioids, the greater your risk of addiction. Pain that lasts for more than 3 months is called chronic pain. Managing chronic pain usually requires more than one approach and is often provided by a team of health care  providers working together (multidisciplinary approach). Pain management may be done at a pain management center or pain clinic. How to manage pain without the use of opioids Use non-opioid medicines Non-opioid medicines for pain may include: Over-the-counter or prescription non-steroidal anti-inflammatory drugs (NSAIDs). These may be the first medicines used for pain. They work well for muscle and bone pain, and they reduce swelling. Acetaminophen. This over-the-counter medicine may work well for milder pain but not swelling. Antidepressants. These may be used to treat chronic pain. A certain type of antidepressant (tricyclics) is often used. These medicines are given in lower doses for pain than when used for depression. Anticonvulsants. These are usually used to treat seizures but may also reduce nerve  (neuropathic) pain. Muscle relaxants. These relieve pain caused by sudden muscle tightening (spasms). You may also use a pain medicine that is applied to the skin as a patch, cream, or gel (topical analgesic), such as a numbing medicine. These may cause fewer side effects than medicines taken by mouth. Do certain therapies as directed Some therapies can help with pain management. They include: Physical therapy. You will do exercises to gain strength and flexibility. A physical therapist may teach you exercises to move and stretch parts of your body that are weak, stiff, or painful. You can learn these exercises at physical therapy visits and practice them at home. Physical therapy may also involve: Massage. Heat wraps or applying heat or cold to affected areas. Electrical signals that interrupt pain signals (transcutaneous electrical nerve stimulation, TENS). Weak lasers that reduce pain and swelling (low-level laser therapy). Signals from your body that help you learn to regulate pain (biofeedback). Occupational therapy. This helps you to learn ways to function at home and work with less pain. Recreational therapy. This involves trying new activities or hobbies, such as a physical activity or drawing. Mental health therapy, including: Cognitive behavioral therapy (CBT). This helps you learn coping skills for dealing with pain. Acceptance and commitment therapy (ACT) to change the way you think and react to pain. Relaxation therapies, including muscle relaxation exercises and mindfulness-based stress reduction. Pain management counseling. This may be individual, family, or group counseling.  Receive medical treatments Medical treatments for pain management include: Nerve block injections. These may include a pain blocker and anti-inflammatory medicines. You may have injections: Near the spine to relieve chronic back or neck pain. Into joints to relieve back or joint pain. Into nerve areas  that supply a painful area to relieve body pain. Into muscles (trigger point injections) to relieve some painful muscle conditions. A medical device placed near your spine to help block pain signals and relieve nerve pain or chronic back pain (spinal cord stimulation device). Acupuncture. Follow these instructions at home Medicines Take over-the-counter and prescription medicines only as told by your health care provider. If you are taking pain medicine, ask your health care providers about possible side effects to watch out for. Do not drive or use heavy machinery while taking prescription opioid pain medicine. Lifestyle  Do not use drugs or alcohol to reduce pain. If you drink alcohol, limit how much you have to: 0-1 drink a day for women who are not pregnant. 0-2 drinks a day for men. Know how much alcohol is in a drink. In the [place] one drink equals one 12 oz bottle of beer (355 mL), one 5 oz glass of wine (148 mL), or one 1 oz glass of hard liquor (44 mL). Do not use any products that contain nicotine or tobacco.  These products include cigarettes, chewing tobacco, and vaping devices, such as e-cigarettes. If you need help quitting, ask your health care provider. Eat a healthy diet and maintain a healthy weight. Poor diet and excess weight may make pain worse. Eat foods that are high in fiber. These include fresh fruits and vegetables, whole grains, and beans. Limit foods that are high in fat and processed sugars, such as fried and sweet foods. Exercise regularly. Exercise lowers stress and may help relieve pain. Ask your health care provider what activities and exercises are safe for you. If your health care provider approves, join an exercise class that combines movement and stress reduction. Examples include yoga and tai chi. Get enough sleep. Lack of sleep may make pain worse. Lower stress as much as possible. Practice stress reduction techniques as told by your therapist. General  instructions Work with all your pain management providers to find the treatments that work best for you. You are an important member of your pain management team. There are many things you can do to reduce pain on your own. Consider joining an online or in-person support group for people who have chronic pain. Keep all follow-up visits. This is important. Where to find more information You can find more information about managing pain without opioids from: American Academy of Pain Medicine: painmed.org Institute for Chronic Pain: instituteforchronicpain.org American Chronic Pain Association: theacpa.org Contact a health care provider if: You have side effects from pain medicine. Your pain gets worse or does not get better with treatments or home therapy. You are struggling with anxiety or depression. Summary Many types of pain can be managed without opioids. Chronic pain may respond better to pain management without opioids. Pain is best managed when you and a team of health care providers work together. Pain management without opioids may include non-opioid medicines, medical treatments, physical therapy, mental health therapy, and lifestyle changes. Tell your health care providers if your pain gets worse or is not being managed well enough. This information is not intended to replace advice given to you by your health care provider. Make sure you discuss any questions you have with your health care provider. Document Revised: 03/10/2021 Document Reviewed: 03/10/2021 Elsevier Patient Education  2023 [employer].

## 2024-03-22 NOTE — Progress Notes (Signed)
 Please attest and cosign this visit due to patients primary care provider not being in the office at the time the visit was completed.  Because this visit was a virtual/telehealth visit,  certain criteria was not obtained, such a blood pressure, CBG if applicable, and timed get up and go. Any medications not marked as "taking" were not mentioned during the medication reconciliation part of the visit. Any vitals not documented were not able to be obtained due to this being a telehealth visit or patient was unable to self-report a recent blood pressure reading due to a lack of equipment at home via telehealth. Vitals that have been documented are verbally provided by the patient.   Subjective:   Morgan [name] is a 58 y.o. who presents for a Medicare Wellness preventive visit.  Visit Complete: Virtual I connected with  [name] on 03/22/24 by a audio enabled telemedicine application and verified that I am speaking with the correct person using two identifiers.  Patient Location: Home  Provider Location: Home Office  I discussed the limitations of evaluation and management by telemedicine. The patient expressed understanding and agreed to proceed.  Vital Signs: Because this visit was a virtual/telehealth visit, some criteria may be missing or patient reported. Any vitals not documented were not able to be obtained and vitals that have been documented are patient reported.  VideoDeclined- This patient declined Librarian, academic. Therefore the visit was completed with audio only.  Persons Participating in Visit: Patient.  AWV Questionnaire: No: Patient Medicare AWV questionnaire was not completed prior to this visit.  Cardiac Risk Factors include: advanced age (>3men, >26 women);diabetes mellitus;dyslipidemia;hypertension     Objective:    Today's Vitals   03/22/24 0804 03/22/24 0807  BP: 135/80   Weight: 167 lb (75.8 kg)   Height: 5\' 4"  (1.626 m)    PainSc:  8    Body mass index is 28.67 kg/m.     03/22/2024    8:14 AM 03/22/2023    9:55 AM 08/31/2022    8:21 AM 06/22/2022    8:38 AM 03/14/2022   10:02 AM 09/14/2021   12:24 PM 03/10/2021    9:57 AM  Advanced Directives  Does Patient Have a Medical Advance Directive? Yes Yes Yes Yes Yes Yes No  Type of Estate agent of [place];Living will Healthcare Power of [place];Living will Healthcare Power of [employer] of [place];Living will Healthcare Power of Attorney   Does patient want to make changes to medical advance directive? No - Patient declined No - Patient declined No - Patient declined   No - Patient declined   Copy of Healthcare Power of Attorney in Chart? Yes - validated most recent copy scanned in chart (See row information) Yes - validated most recent copy scanned in chart (See row information) Yes - validated most recent copy scanned in chart (See row information)  Yes - validated most recent copy scanned in chart (See row information) Yes - validated most recent copy scanned in chart (See row information)   Would patient like information on creating a medical advance directive?       No - Patient declined    Current Medications (verified) Outpatient Encounter Medications as of 03/22/2024  Medication Sig   amoxicillin-clavulanate (AUGMENTIN) 875-125 MG tablet Take 1 tablet by mouth 2 (two) times daily. Take all of this medication   Calcium-Vitamin D-Vitamin K 650-12.5-40 MG-MCG-MCG CHEW Chew 1 each by mouth 2 (two) times daily.  desloratadine (CLARINEX) 5 MG tablet Take 1 tablet (5 mg total) by mouth daily. For allergies   dicyclomine (BENTYL) 20 MG tablet TAKE 1 TABLET EVERY 8 HOURS AS NEEDED FOR ABDOMINAL CRAMPING   ezetimibe (ZETIA) 10 MG tablet Take 1 tablet (10 mg total) by mouth daily.   gabapentin (NEURONTIN) 300 MG capsule Takes 2 capsules in morning, one cap at lunch, 2 caps at bedtime   Galcanezumab-gnlm (EMGALITY) 120 MG/ML SOAJ  Inject 120 mg into the skin every 30 (thirty) days.   Glucagon, rDNA, (GLUCAGON EMERGENCY IJ) Inject 1 Syringe as directed daily as needed (emergency low blood sugar).    glucose blood (ONETOUCH ULTRA) test strip Check BS four times a day Dx E11.9   levothyroxine (SYNTHROID) 112 MCG tablet TAKE ONE TABLET ONCE DAILY BEFORE BREAKFAST   LINZESS 145 MCG CAPS capsule TAKE (1) CAPSULE DAILY BEFORE BREAKFAST.   loratadine (CLARITIN) 10 MG tablet Take 10 mg by mouth daily as needed for allergies.   methocarbamol (ROBAXIN) 750 MG tablet TAKE ONE TABLET EVERY 8 HOURS AS NEEDED FOR MUSCLE SPASMS   mirabegron ER (MYRBETRIQ) 25 MG TB24 tablet Take 1 tablet (25 mg total) by mouth daily.   mometasone (NASONEX) 50 MCG/ACT nasal spray Place 2 sprays into the nose daily. To replace flonase   Multiple Vitamins-Minerals (BARIATRIC MULTIVITAMINS/IRON PO) Take 1 tablet by mouth daily.   nystatin (MYCOSTATIN/NYSTOP) powder APPLY TO AFFECTED AREA OF GROIN RASH TWICE A DAY FOR 7 TO 10 DAYS PER FLARE   pantoprazole (PROTONIX) 40 MG tablet Take 1 tablet (40 mg total) by mouth daily.   polyethylene glycol (MIRALAX / GLYCOLAX) packet Take 17 g by mouth at bedtime.   Probiotic Product (PROBIOTIC DAILY PO) Take 1 capsule by mouth daily.   rizatriptan (MAXALT-MLT) 10 MG disintegrating tablet Take 1 tablet (10 mg total) by mouth as needed (take 1 tablet as needed for migraine. may repeat in 2 hours if needed).   sertraline (ZOLOFT) 100 MG tablet TAKE 2 TABLETS DAILY AS DIRECTED   Simethicone (GAS-X EXTRA STRENGTH) 125 MG CAPS Take 125 mg by mouth in the morning, at noon, and at bedtime.   traMADol (ULTRAM) 50 MG tablet Take 1 tablet (50 mg total) by mouth 4 (four) times daily as needed for up to 5 days for moderate pain (pain score 4-6).   ziprasidone (GEODON) 60 MG capsule Take 1 capsule (60 mg total) by mouth at bedtime.   divalproex (DEPAKOTE) 500 MG DR tablet Take 500 mg by mouth in the morning and 1000 mg at bedtime  (Patient not taking: Reported on 03/22/2024)   No facility-administered encounter medications on file as of 03/22/2024.    Allergies (verified) Ketoconazole, Pravachol [pravastatin sodium], Statins, Tape, and Codeine   History: Past Medical History:  Diagnosis Date   Allergic rhinitis    Anemia     after gastric bypass in 2018   Anxiety    Barrett's esophagus    Bipolar affective disorder (HCC)    CAP (community acquired pneumonia) 07/20/2018   Chronic respiratory failure (HCC)    Constipation, chronic    Degenerative joint disease of spine    Depression    Diabetes mellitus without complication (HCC)    type 2    DM (diabetes mellitus) (HCC)    "hypoglycemic" per pt - no longer takes DM meds due to weight loss from gastric bypass in 2018   Dry eye    Family history of adverse reaction to anesthesia    nausea  and vomiting   Gastroparesis    GERD (gastroesophageal reflux disease)    H/O shoulder replacement 08/11/2020   left shoulder   History of bariatric surgery 03/2017   History of hiatal hernia    HLD (hyperlipidemia) 03/12/2013   Hyperlipidemia    Hypertension    No HTN meds since weight loss from Gastric Bypass in 2018   Intractable chronic migraine without aura 06/04/2015   Migraine headache    Morbid obesity (HCC)    OSA (obstructive sleep apnea)    No cpap since gastric surgery   Osteoarthritis    bilateral knee   SBO (small bowel obstruction) (HCC) 03/19/2019   Sleep apnea    Unspecified hypothyroidism    Vitamin B 12 deficiency    Vitamin D deficiency    Past Surgical History:  Procedure Laterality Date   ANKLE ARTHROSCOPY WITH RECONSTRUCTION Right 09/24/2019   Procedure: ANKLE ARTHROSCOPY DEBRIDEMENT TREATMENT OF OSTEOCHONDRAL LESION TALUS. PRONEAL TENDON DEBRIDEMENT;  Surgeon: Terance Hart, MD;  Location: [facility];  Service: Orthopedics;  Laterality: Right;  SURGERY REQUEST TIME 2 HOURS   APPENDECTOMY  1978   CHOLECYSTECTOMY  2005    COLONOSCOPY     disc repair  05/17/2019   with rods, lumbar spine   GASTRIC ROUX-EN-Y N/A 03/21/2017   Procedure: LAPAROSCOPIC ROUX-EN-Y GASTRIC, UPPER ENDO;  Surgeon: Gaynelle Adu, MD;  Location: [facility];  Service: General;  Laterality: N/A;   GASTROSTOMY N/A 06/19/2018   Procedure: LAPRASCOPIC INSERTION OF GASTROSTOMY TUBE;  Surgeon: Gaynelle Adu, MD;  Location: [facility];  Service: General;  Laterality: N/A;   GASTROSTOMY TUBE PLACEMENT Left    06/2018   HIATAL HERNIA REPAIR N/A 06/19/2018   Procedure: LAPAROSCOPIC REPAIR OF HIATAL HERNIA;  Surgeon: Gaynelle Adu, MD;  Location: [facility];  Service: General;  Laterality: N/A;   LAPAROSCOPIC LYSIS OF ADHESIONS  03/19/2019   Dr. Phylliss Blakes   LAPAROSCOPY N/A 06/19/2018   Procedure: LAPAROSCOPY DIAGNOSTIC;  Surgeon: Gaynelle Adu, MD;  Location: [facility];  Service: General;  Laterality: N/A;   LAPAROSCOPY N/A 03/19/2019   Procedure: LAPAROSCOPY DIAGNOSTIC lysis of adhesions;  Surgeon: Berna Bue, MD;  Location: [facility];  Service: General;  Laterality: N/A;   LUMBAR LAMINECTOMY/DECOMPRESSION MICRODISCECTOMY Right 10/11/2018   Procedure: LAMINECTOMY AND FORAMINOTOMY RIGHT LUMBAR FOUR- LUMBAR FIVE;  Surgeon: Lisbeth Renshaw, MD;  Location: [facility];  Service: Neurosurgery;  Laterality: Right;   MASS EXCISION Right 09/23/2021   Procedure: EXCISION MASS VOLAR ASPECT RIGHT HAND;  Surgeon: Betha Loa, MD;  Location: [facility];  Service: Orthopedics;  Laterality: Right;  45 MIN   SHOULDER ARTHROSCOPY  06/2011   left-dsc   TONSILLECTOMY  at age 12   TOTAL SHOULDER ARTHROPLASTY Left 08/11/2020   Procedure: TOTAL SHOULDER ARTHROPLASTY;  Surgeon: Teryl Lucy, MD;  Location: [facility];  Service: Orthopedics;  Laterality: Left;   TRIGGER FINGER RELEASE  12/20/2012   Procedure: RELEASE TRIGGER FINGER/A-1 PULLEY;  Surgeon: Tami Ribas, MD;  Location: [facility];  Service: Orthopedics;  Laterality: Left;   LEFT TRIGGER THUMB RELEASE   TRIGGER FINGER RELEASE Right 09/23/2021   Procedure: RELEASE A-1 PULLEY RIGHT INDEX FINGER;  Surgeon: Betha Loa, MD;  Location: [facility];  Service: Orthopedics;  Laterality: Right;   Family History  Problem Relation Age of Onset   Asthma Father    Allergies Father    Heart disease Father        enlarged  heart   Peripheral vascular disease Father    Diabetes Father    Hyperlipidemia Father    Arthritis Father    Asthma Sister    Cancer Sister        colon at 7 yr old.   Colon cancer Sister    Allergies Mother    Stroke Mother 42       with hemi paralysis   Diabetes Mother    Hyperlipidemia Mother    Hypertension Mother    GI problems Mother    Arthritis Mother    Allergies Brother    Early death Brother 9       congenital abormality   Allergies Sister    Diabetes Sister    Asthma Sister    Colon polyps Sister    Hyperlipidemia Sister    GI problems Sister        gastroporesis    Liver disease Sister        fatty liver   Stroke Sister        intercrandial bleed   Diabetes Brother    Hypertension Brother    Hyperlipidemia Brother    Heart disease Paternal Aunt    Migraines Neg Hx    Social History   Socioeconomic History   Marital status: Single    Spouse name: Not on file   Number of children: 0   Years of education: HS   Highest education level: 12th grade  Occupational History   Occupation: disabled    Associate Professor: UNEMPLOYED  Tobacco Use   Smoking status: Never   Smokeless tobacco: Never  Vaping Use   Vaping status: Never Used  Substance and Sexual Activity   Alcohol use: No   Drug use: No   Sexual activity: Never    Birth control/protection: Post-menopausal    Comment: LMP 10/2017  Other Topics Concern   Not on file  Social History Narrative   Patient is right handed.   Patient drinks 2 glasses of caffeine daily.   Lives at home. Her niece is living with her right now.   Social Drivers of  Corporate investment banker Strain: Low Risk  (03/22/2024)   Overall Financial Resource Strain (CARDIA)    Difficulty of Paying Living Expenses: Not hard at all  Food Insecurity: No Food Insecurity (03/22/2024)   Hunger Vital Sign    Worried About Running Out of Food in the Last Year: Never true    Ran Out of Food in the Last Year: Never true  Transportation Needs: No Transportation Needs (03/22/2024)   PRAPARE - Administrator, Civil Service (Medical): No    Lack of Transportation (Non-Medical): No  Physical Activity: Insufficiently Active (03/22/2024)   Exercise Vital Sign    Days of Exercise per Week: 3 days    Minutes of Exercise per Session: 30 min  Stress: No Stress Concern Present (03/22/2024)   [employer] of Occupational Health - Occupational Stress Questionnaire    Feeling of Stress : Only a little  Social Connections: Unknown (03/22/2024)   Social Connection and Isolation Panel [NHANES]    Frequency of Communication with Friends and Family: More than three times a week    Frequency of Social Gatherings with Friends and Family: More than three times a week    Attends Religious Services: More than 4 times per year    Active Member of [employer] or Organizations: Patient declined    Attends Banker Meetings: Patient declined  Marital Status: Never married    Tobacco Counseling Counseling given: Yes    Clinical Intake:  Pre-visit preparation completed: Yes  Pain : 0-10 Pain Score: 8  Pain Type: Chronic pain Pain Location: Back Pain Orientation: Lower Pain Descriptors / Indicators: Sharp Pain Onset: Yesterday (pt has a history of spinal fusions. states her back was feeling good, then pain woke her during her the night last night.) Pain Frequency: Constant     Nutritional Risks: None Diabetes: Yes CBG done?: No (telehealth visit. unable to obtain cbg) Did pt. bring in CBG monitor from home?: No  Lab Results  Component Value Date    HGBA1C 5.8 (H) 11/29/2023   HGBA1C 5.2 05/29/2023   HGBA1C 6.1 (H) 01/27/2023     How often do you need to have someone help you when you read instructions, pamphlets, or other written materials from your doctor or pharmacy?: 1 - Never  Interpreter Needed?: No  Information entered by ::  W   Activities of Daily Living     03/22/2024    8:15 AM  In your present state of health, do you have any difficulty performing the following activities:  Hearing? 0  Vision? 0  Difficulty concentrating or making decisions? 0  Walking or climbing stairs? 1  Dressing or bathing? 0  Doing errands, shopping? 0  Preparing Food and eating ? N  Using the Toilet? N  In the past six months, have you accidently leaked urine? N  Do you have problems with loss of bowel control? N  Managing your Medications? N  Managing your Finances? N  Housekeeping or managing your Housekeeping? N    Patient Care Team: Raliegh Ip, DO as PCP - General (Family Medicine) Chilton Si, MD as PCP - Cardiology (Cardiology) Philip Aspen, DO as Attending Physician (Obstetrics and Gynecology) Teryl Lucy, MD as Attending Physician (Orthopedic Surgery) Mixon, Kateri Mc as Consulting Physician (Unknown Physician Specialty) Michaelle Copas, MD as Referring Physician (Optometry) Waymon Budge, MD as Consulting Physician (Pulmonary Disease) Lunette Stands, MD as Consulting Physician (Orthopedic Surgery) Gaynelle Adu, MD as Consulting Physician (General Surgery) Lisbeth Renshaw, MD as Consulting Physician (Neurosurgery) Rowell, Benedetto Coons, MD as Referring Physician (Endocrinology) Shawnie Dapper, NP as Nurse Practitioner (Neurology)  Indicate any recent Medical Services you may have received from other than [facility] providers in the past year (date may be approximate).     Assessment:   This is a routine wellness examination for Earma.  Hearing/Vision screen Hearing Screening - Comments:: Patient  denies any hearing difficulties.   Vision Screening - Comments:: Patient wears reading glasses only. Up to date with yearly exams.  Sees [facility] (inside [facility])   Goals Addressed             This Visit's Progress    Patient Stated       I wanna go on a vacation to the beach.        Depression Screen     03/22/2024    8:18 AM 03/18/2024    9:45 AM 11/29/2023    8:22 AM 05/29/2023    8:05 AM 05/29/2023    8:02 AM 03/22/2023    9:54 AM 03/06/2023   10:42 AM  PHQ 2/9 Scores  PHQ - 2 Score 1 5 4 5  0 0 6  PHQ- 9 Score 4 13 9 15   0 17    Fall Risk     03/22/2024    8:14 AM 11/29/2023  8:32 AM 05/29/2023    8:02 AM 03/22/2023    9:53 AM 07/26/2022    8:39 AM  Fall Risk   Falls in the past year? 0 0 0 0 0  Number falls in past yr: 0 0  0   Injury with Fall? 0 0  0   Risk for fall due to : No Fall Risks;Impaired balance/gait;Impaired mobility;Orthopedic patient No Fall Risks  No Fall Risks   Follow up Falls prevention discussed;Falls evaluation completed Falls evaluation completed  Falls prevention discussed     MEDICARE RISK AT HOME:  Medicare Risk at Home Any stairs in or around the home?: Yes If so, are there any without handrails?: No Home free of loose throw rugs in walkways, pet beds, electrical cords, etc?: Yes Adequate lighting in your home to reduce risk of falls?: Yes Life alert?: No Use of a cane, walker or w/c?: Yes Grab bars in the bathroom?: Yes Shower chair or bench in shower?: Yes Elevated toilet seat or a handicapped toilet?: Yes  TIMED UP AND GO:  Was the test performed?  No  Cognitive Function: 6CIT completed    07/19/2018    9:21 AM 07/10/2017    9:00 AM 06/29/2016    9:19 AM  MMSE - Mini Mental State Exam  Orientation to time 5 5 5   Orientation to Place 5 5 5   Registration 3 3 3   Attention/ Calculation 5 5 5   Recall 3 3 3   Language- name 2 objects 2 2 2   Language- repeat 1 1 1   Language- follow 3 step command 3 3 3    Language- read & follow direction 1 1 1   Write a sentence 1 1 1   Copy design 1 1 1   Total score 30 30 30         03/22/2024    8:11 AM 03/22/2023    9:56 AM 03/14/2022   10:06 AM 03/10/2021    9:58 AM 03/05/2020    9:46 AM  6CIT Screen  What Year? 0 points 0 points 0 points 0 points 0 points  What month? 0 points 0 points 0 points 0 points 0 points  What time? 0 points 0 points 0 points 0 points 0 points  Count back from 20 0 points 0 points 0 points 0 points 0 points  Months in reverse 0 points 0 points 0 points 0 points 0 points  Repeat phrase 0 points 0 points 4 points 0 points 0 points  Total Score 0 points 0 points 4 points 0 points 0 points    Immunizations Immunization History  Administered Date(s) Administered   Influenza Inj Mdck Quad With Preservative 09/11/2017   Influenza Split 09/11/2020, 08/31/2023   Influenza,inj,Quad PF,6+ Mos 09/29/2016, 09/07/2017, 09/11/2018, 09/01/2021, 09/05/2022   Influenza,inj,quad, With Preservative 09/17/2019   Influenza-Unspecified 09/11/2016   Moderna Covid-19 Fall Seasonal Vaccine 60yrs & older 12/14/2022   Moderna Covid-19 Vaccine Bivalent Booster 39yrs & up 11/23/2021   Moderna Sars-Covid-2 Vaccination 03/02/2020, 03/30/2020, 10/14/2020   PNEUMOCOCCAL CONJUGATE-20 01/26/2022   Pneumococcal Polysaccharide-23 06/29/2016   Td 07/09/2015   Tdap 07/09/2015   Zoster Recombinant(Shingrix) 10/06/2017, 01/04/2018    Screening Tests Health Maintenance  Topic Date Due   COVID-19 Vaccine (6 - 2024-25 season) 08/13/2023   Diabetic kidney evaluation - Urine ACR  01/28/2024   FOOT EXAM  01/28/2024   Cervical Cancer Screening (HPV/Pap Cotest)  11/28/2024 (Originally 11/24/2023)   HEMOGLOBIN A1C  05/29/2024   OPHTHALMOLOGY EXAM  06/22/2024   INFLUENZA VACCINE  07/12/2024   Diabetic kidney evaluation - eGFR measurement  11/28/2024   MAMMOGRAM  12/28/2024   Medicare Annual Wellness (AWV)  03/22/2025   DTaP/Tdap/Td (3 - Td or Tdap)  07/08/2025   Colonoscopy  12/15/2025   Pneumococcal Vaccine 29-32 Years old  Completed   Hepatitis C Screening  Completed   HIV Screening  Completed   Zoster Vaccines- Shingrix  Completed   HPV VACCINES  Aged Out   Meningococcal B Vaccine  Aged Out    Health Maintenance  Health Maintenance Due  Topic Date Due   COVID-19 Vaccine (6 - 2024-25 season) 08/13/2023   Diabetic kidney evaluation - Urine ACR  01/28/2024   FOOT EXAM  01/28/2024   Health Maintenance Items Addressed: UACR (Urine Albumin:Creatinine Ratio) patient advised that her diabetic foot exam is due. Will send provider a message.   Additional Screening:  Vision Screening: Recommended annual ophthalmology exams for early detection of glaucoma and other disorders of the eye.  Dental Screening: Recommended annual dental exams for proper oral hygiene  Community Resource Referral / Chronic Care Management: CRR required this visit?  No   CCM required this visit?  No     Plan:     I have personally reviewed and noted the following in the patient's chart:   Medical and social history Use of alcohol, tobacco or illicit drugs  Current medications and supplements including opioid prescriptions. Patient is currently taking opioid prescriptions. Information provided to patient regarding non-opioid alternatives. Patient advised to discuss non-opioid treatment plan with their provider. Functional ability and status Nutritional status Physical activity Advanced directives List of other physicians Hospitalizations, surgeries, and ER visits in previous 12 months Vitals Screenings to include cognitive, depression, and falls Referrals and appointments  In addition, I have reviewed and discussed with patient certain preventive protocols, quality metrics, and best practice recommendations. A written personalized care plan for preventive services as well as general preventive health recommendations were provided to patient.      Jordan Hawks Grant Henkes, CMA   03/22/2024   After Visit Summary: (MyChart) Due to this being a telephonic visit, the after visit summary with patients personalized plan was offered to patient via MyChart   Notes: Please refer to Routing Comments.s

## 2024-04-18 DIAGNOSIS — Z9884 Bariatric surgery status: Secondary | ICD-10-CM | POA: Diagnosis not present

## 2024-04-18 DIAGNOSIS — E161 Other hypoglycemia: Secondary | ICD-10-CM | POA: Diagnosis not present

## 2024-04-18 DIAGNOSIS — E162 Hypoglycemia, unspecified: Secondary | ICD-10-CM | POA: Diagnosis not present

## 2024-05-13 ENCOUNTER — Other Ambulatory Visit: Payer: Self-pay | Admitting: Family Medicine

## 2024-05-13 DIAGNOSIS — J301 Allergic rhinitis due to pollen: Secondary | ICD-10-CM

## 2024-05-14 ENCOUNTER — Ambulatory Visit: Payer: Self-pay

## 2024-05-14 DIAGNOSIS — R112 Nausea with vomiting, unspecified: Secondary | ICD-10-CM | POA: Diagnosis not present

## 2024-05-14 DIAGNOSIS — E119 Type 2 diabetes mellitus without complications: Secondary | ICD-10-CM | POA: Diagnosis not present

## 2024-05-14 DIAGNOSIS — Z1152 Encounter for screening for COVID-19: Secondary | ICD-10-CM | POA: Diagnosis not present

## 2024-05-14 DIAGNOSIS — R109 Unspecified abdominal pain: Secondary | ICD-10-CM | POA: Diagnosis not present

## 2024-05-14 DIAGNOSIS — R10819 Abdominal tenderness, unspecified site: Secondary | ICD-10-CM | POA: Diagnosis not present

## 2024-05-14 DIAGNOSIS — R1010 Upper abdominal pain, unspecified: Secondary | ICD-10-CM | POA: Diagnosis not present

## 2024-05-14 DIAGNOSIS — Z9884 Bariatric surgery status: Secondary | ICD-10-CM | POA: Diagnosis not present

## 2024-05-14 DIAGNOSIS — E039 Hypothyroidism, unspecified: Secondary | ICD-10-CM | POA: Diagnosis not present

## 2024-05-14 NOTE — Telephone Encounter (Signed)
 Copied from CRM [id number]. Topic: Clinical - Red Word Triage >> May 14, 2024  9:24 AM Antwanette L wrote: Red Word that prompted transfer to Nurse Triage: Patient is diabetic and is having severe stomach pain, nausea    Chief Complaint: Upper abdominal pain, pain is constant Symptoms: above Frequency: Last night Pertinent Negatives: Patient denies  Disposition: [x] ED /[] Urgent Care (no appt availability in office) / [] Appointment(In office/virtual)/ []  [facility]/ [] Home Care/ [] Refused Recommended Disposition /[] [facility]/ []  Follow-up with PCP Additional Notes: States she can't wait until this afternoon to be seen.  Reason for Disposition  [1] SEVERE pain (e.g., excruciating) AND [2] present > 1 hour  Answer Assessment - Initial Assessment Questions 1. LOCATION: "Where does it hurt?"      Above belly button 2. RADIATION: "Does the pain shoot anywhere else?" (e.g., chest, back)     no 3. ONSET: "When did the pain begin?" (e.g., minutes, hours or days ago)      Last night 4. SUDDEN: "Gradual or sudden onset?"     sudden 5. PATTERN "Does the pain come and go, or is it constant?"    - If it comes and goes: "How long does it last?" "Do you have pain now?"     (Note: Comes and goes means the pain is intermittent. It goes away completely between bouts.)    - If constant: "Is it getting better, staying the same, or getting worse?"      (Note: Constant means the pain never goes away completely; most serious pain is constant and gets worse.)      constant 6. SEVERITY: "How bad is the pain?"  (e.g., Scale 1-10; mild, moderate, or severe)    - MILD (1-3): Doesn't interfere with normal activities, abdomen soft and not tender to touch.     - MODERATE (4-7): Interferes with normal activities or awakens from sleep, abdomen tender to touch.     - SEVERE (8-10): Excruciating pain, doubled over, unable to do any normal activities.       10 7. RECURRENT SYMPTOM: "Have you  ever had this type of stomach pain before?" If Yes, ask: "When was the last time?" and "What happened that time?"      no 8. CAUSE: "What do you think is causing the stomach pain?"     unsure 9. RELIEVING/AGGRAVATING FACTORS: "What makes it better or worse?" (e.g., antacids, bending or twisting motion, bowel movement)     no 10. OTHER SYMPTOMS: "Do you have any other symptoms?" (e.g., back pain, diarrhea, fever, urination pain, vomiting)       nausea 11. PREGNANCY: "Is there any chance you are pregnant?" "When was your last menstrual period?"       no  Protocols used: Abdominal Pain - [facility]

## 2024-05-14 NOTE — Telephone Encounter (Signed)
 FYI

## 2024-05-15 DIAGNOSIS — R112 Nausea with vomiting, unspecified: Secondary | ICD-10-CM | POA: Diagnosis not present

## 2024-05-15 DIAGNOSIS — E039 Hypothyroidism, unspecified: Secondary | ICD-10-CM | POA: Diagnosis not present

## 2024-05-15 DIAGNOSIS — R109 Unspecified abdominal pain: Secondary | ICD-10-CM | POA: Diagnosis not present

## 2024-05-30 ENCOUNTER — Encounter: Payer: Self-pay | Admitting: Family Medicine

## 2024-06-03 ENCOUNTER — Ambulatory Visit: Admitting: Family Medicine

## 2024-06-03 ENCOUNTER — Encounter: Payer: Self-pay | Admitting: Family Medicine

## 2024-06-03 VITALS — BP 124/80 | HR 64 | Ht 64.0 in | Wt 154.8 lb

## 2024-06-03 DIAGNOSIS — G43109 Migraine with aura, not intractable, without status migrainosus: Secondary | ICD-10-CM | POA: Diagnosis not present

## 2024-06-03 MED ORDER — QULIPTA 60 MG PO TABS
60.0000 mg | ORAL_TABLET | Freq: Every day | ORAL | 3 refills | Status: DC
Start: 2024-06-03 — End: 2024-06-24

## 2024-06-03 MED ORDER — SUMATRIPTAN SUCCINATE 100 MG PO TABS
100.0000 mg | ORAL_TABLET | Freq: Once | ORAL | 11 refills | Status: AC | PRN
Start: 2024-06-03 — End: ?

## 2024-06-03 MED ORDER — GABAPENTIN 300 MG PO CAPS: ORAL_CAPSULE | ORAL | 3 refills | Status: AC

## 2024-06-03 NOTE — Progress Notes (Signed)
 Chief Complaint  Patient presents with   Follow-up    RM 2, alone. Last seen 11/14/23. Here to talk about alternate migraine meds.    HISTORY OF PRESENT ILLNESS:  06/03/24 ALL:  Morgan [name] is a 58 y.o. female here today for follow up for migraines. She was last seen 11/2023 and doing well on Emgality , divalproex  500/1000 and gabapentin  600/300/600. We decided to wean divalproex  due to tremor and she was able to stop this medication around toward the end of December. She reports doing pretty good until 02/2024. Since headaches have worsened in frequency. She feels Emgality  is not lasting the whole month. She has a week or so where headaches get more frequent. She estimates 8-10 headache days with 6-7 of these being migrainous. She sees black spots before headache, sometimes without headache. Recently, rizatriptan  seems to make headache worse. Tremor has improved. She is drinking at least 60 ounces of water  daily. Mood is good.   No estrogen.   Tried and failed: Emgality  (lost effectiveness), Ajovy  (not effective), gabapentin  (taking now), divalproex  (tremor), valsartan , topiramate , propranolol , sertraline , sumatriptan , rizatriptan , tizanidine   11/14/23 ALL (Mychart):  Morgan [name] returns for follow up for migraines. She continues Emgality , divalproex  500/100 and gabapentin  600/300/600. Rizatriptan  used for abortive therapy. She continues to do well. She is having about 3-4 headache days a month. Rizatriptan  works well. She does note that Emgality  seems less effective just prior to next injection. She has sharp pains the day of the injection and 2-3 days afterward. She feels this is mild and wishes to monitor for now. She has a mild tremor of right hand at times.   VPA checked with PCP and normal per patient. CBC and chem reviewed in Epic and unremarkable.   11/01/2022 ALL (Mychart):  Morgan [name] returns for follow up for migraines. She continues divalproex  500/1000, gabapentin  600/300/600,  Emgality  and rizatriptan . She reports having an increase in headaches after back surgery in 08/2022. She contributes this to pain medications. She may have 1-5 migraines per month. Rizatriptan  works well. She is feeling well and without concerns.   10/26/2021 ALL (Mychart): Morgan [name] returns for migraine follow up. She continues divalproex , gabapentin  and Emgality . Rizatriptan  used for abortive therapy. She reports headaches are fairly well managed. She has had a few more headaches over the past couple of months. She has 3-5 migraines per month. Rizatriptan  works well for abortive therapy. She reports that CBGs have been fluctuating. She is scheduling follow up with PCP and endo to discuss.   10/20/2020 ALL (Mychart):  Morgan [name] is a 58 y.o. female here today for follow up for migraines. We continues divalproex , gabapentin  and Emgality . She was started on rizatriptan  for abortive therapy at last follow up. She reports that she is doing very well. She has 2-3 headache days per month on average. Rizatriptan  has worked very well for abortive therapy. She is tolerating medications well with no obvious adverse effects. She had left shoulder replacement in 07/2020 and is recovering well.   04/14/2020 ALL (Mychart): Morgan [name] is a 58 y.o. female here today for follow up of migraines.  She continues divalproex  and gabapentin  as prescribed.  We switched her to Emgality  in February she reports that headaches have decreased significantly in intensity and frequency.  She had 4 headaches in the month of April.  She continues to follow with orthopedics closely for neck pain.  She has been dissipated in physical therapy and received 1 steroid injection.  She does not  feel that either were very beneficial.  She has tried dry needling with no success.   01/14/2020 ALL (Mychart):  Morgan [name] is a 58 y.o. female here today for follow up.She continues divalproex  500mg  and 1000mg  at night, gabapentin  600mg  in the  am, 300mg  at lunch and 600mg  at bedtime and Ajovy  monthly. She does feel that headaches are less severe but continue daily. She feels that pounding headaches are much less severe. She is tolerating medications well, however, insurance is requiring that she try Emgality . They will no longer pay for Ajovy . Insurance will cover Emgality . She is willing to switch medications. She continues to have chronic neck pain managed by orthopedics at this time. She is current treating with steroid injections.    History (copied from Dr Sharion note on 05/22/2019)   Interval history: 24 headache days a month. Most are migraines. 15 are severe. Ajovy  may be helping a little, she has had so much going on she doesn't know if that is contributing. She had emergency surgery in April and recent back surgery. She is taking oxycontin  and 325mg  tylenol . She will keep on the Ajovy  and when all the issues are calmed down she will call us  and discuss nerve blocks.    REVIEW OF SYSTEMS: Out of a complete 14 system review of symptoms, the patient complains only of the following symptoms, migraines, depression, and all other reviewed systems are negative.   ALLERGIES: Allergies  Allergen Reactions   Ketoconazole Hives and Swelling    SWELLING REACTION UNSPECIFIED    Pravachol  [Pravastatin  Sodium] Shortness Of Breath, Swelling and Anaphylaxis    Throat swelling   Statins Other (See Comments)    Leg cramps   Tape Dermatitis and Other (See Comments)    Steri-Strips   Codeine Hypertension    increased BP     HOME MEDICATIONS: Outpatient Medications Prior to Visit  Medication Sig Dispense Refill   Calcium -Vitamin D -Vitamin K  650-12.5-40 MG-MCG-MCG CHEW Chew 1 each by mouth 2 (two) times daily.     desloratadine  (CLARINEX ) 5 MG tablet TAKE ONE TABLET DAILY 90 tablet 1   dicyclomine  (BENTYL ) 20 MG tablet TAKE 1 TABLET EVERY 8 HOURS AS NEEDED FOR ABDOMINAL CRAMPING 30 tablet 5   ezetimibe  (ZETIA ) 10 MG tablet Take 1 tablet  (10 mg total) by mouth daily. 90 tablet 3   Glucagon , rDNA, (GLUCAGON  EMERGENCY IJ) Inject 1 Syringe as directed daily as needed (emergency low blood sugar).      glucose blood (ONETOUCH ULTRA) test strip Check BS four times a day Dx E11.9 400 strip 3   levothyroxine  (SYNTHROID ) 112 MCG tablet TAKE ONE TABLET ONCE DAILY BEFORE BREAKFAST 90 tablet 3   LINZESS  145 MCG CAPS capsule TAKE (1) CAPSULE DAILY BEFORE BREAKFAST. 90 capsule 3   loratadine  (CLARITIN ) 10 MG tablet Take 10 mg by mouth daily as needed for allergies.     methocarbamol  (ROBAXIN ) 750 MG tablet TAKE ONE TABLET EVERY 8 HOURS AS NEEDED FOR MUSCLE SPASMS 30 tablet 2   mirabegron  ER (MYRBETRIQ ) 25 MG TB24 tablet Take 1 tablet (25 mg total) by mouth daily. 90 tablet 3   mometasone  (NASONEX ) 50 MCG/ACT nasal spray Place 2 sprays into the nose daily. To replace flonase  1 each 12   Multiple Vitamins-Minerals (BARIATRIC MULTIVITAMINS/IRON  PO) Take 1 tablet by mouth daily.     nystatin  (MYCOSTATIN /NYSTOP ) powder APPLY TO AFFECTED AREA OF GROIN RASH TWICE A DAY FOR 7 TO 10 DAYS PER FLARE 30 g 1  pantoprazole  (PROTONIX ) 40 MG tablet Take 1 tablet (40 mg total) by mouth daily. 90 tablet 3   polyethylene glycol (MIRALAX  / GLYCOLAX ) packet Take 17 g by mouth at bedtime.     Probiotic Product (PROBIOTIC DAILY PO) Take 1 capsule by mouth daily.     sertraline  (ZOLOFT ) 100 MG tablet TAKE 2 TABLETS DAILY AS DIRECTED 180 tablet 3   Simethicone  (GAS-X EXTRA STRENGTH) 125 MG CAPS Take 125 mg by mouth in the morning, at noon, and at bedtime.     ziprasidone  (GEODON ) 60 MG capsule Take 1 capsule (60 mg total) by mouth at bedtime. 90 capsule 3   amoxicillin -clavulanate (AUGMENTIN ) 875-125 MG tablet Take 1 tablet by mouth 2 (two) times daily. Take all of this medication 20 tablet 0   divalproex  (DEPAKOTE ) 500 MG DR tablet Take 500 mg by mouth in the morning and 1000 mg at bedtime (Patient not taking: Reported on 03/22/2024) 270 tablet 3   gabapentin   (NEURONTIN ) 300 MG capsule Takes 2 capsules in morning, one cap at lunch, 2 caps at bedtime 450 capsule 3   Galcanezumab -gnlm (EMGALITY ) 120 MG/ML SOAJ Inject 120 mg into the skin every 30 (thirty) days. 3 mL 3   rizatriptan  (MAXALT -MLT) 10 MG disintegrating tablet Take 1 tablet (10 mg total) by mouth as needed (take 1 tablet as needed for migraine. may repeat in 2 hours if needed). 10 tablet 0   No facility-administered medications prior to visit.     PAST MEDICAL HISTORY: Past Medical History:  Diagnosis Date   Allergic rhinitis    Anemia     after gastric bypass in 2018   Anxiety    Barrett's esophagus    Bipolar affective disorder (HCC)    CAP (community acquired pneumonia) 07/20/2018   Chronic respiratory failure (HCC)    Constipation, chronic    Degenerative joint disease of spine    Depression    Diabetes mellitus without complication (HCC)    type 2    DM (diabetes mellitus) (HCC)    hypoglycemic per pt - no longer takes DM meds due to weight loss from gastric bypass in 2018   Dry eye    Family history of adverse reaction to anesthesia    nausea and vomiting   Gastroparesis    GERD (gastroesophageal reflux disease)    H/O shoulder replacement 08/11/2020   left shoulder   History of bariatric surgery 03/2017   History of hiatal hernia    HLD (hyperlipidemia) 03/12/2013   Hyperlipidemia    Hypertension    No HTN meds since weight loss from Gastric Bypass in 2018   Intractable chronic migraine without aura 06/04/2015   Migraine headache    Morbid obesity (HCC)    OSA (obstructive sleep apnea)    No cpap since gastric surgery   Osteoarthritis    bilateral knee   SBO (small bowel obstruction) (HCC) 03/19/2019   Sleep apnea    Unspecified hypothyroidism    Vitamin B 12 deficiency    Vitamin D  deficiency      PAST SURGICAL HISTORY: Past Surgical History:  Procedure Laterality Date   ANKLE ARTHROSCOPY WITH RECONSTRUCTION Right 09/24/2019   Procedure: ANKLE  ARTHROSCOPY DEBRIDEMENT TREATMENT OF OSTEOCHONDRAL LESION TALUS. PRONEAL TENDON DEBRIDEMENT;  Surgeon: Elsa Lonni SAUNDERS, MD;  Location: [facility];  Service: Orthopedics;  Laterality: Right;  SURGERY REQUEST TIME 2 HOURS   APPENDECTOMY  1978   CHOLECYSTECTOMY  2005   COLONOSCOPY     disc repair  05/17/2019   with rods, lumbar spine   GASTRIC ROUX-EN-Y N/A 03/21/2017   Procedure: LAPAROSCOPIC ROUX-EN-Y GASTRIC, UPPER ENDO;  Surgeon: Camellia Blush, MD;  Location: [facility];  Service: General;  Laterality: N/A;   GASTROSTOMY N/A 06/19/2018   Procedure: LAPRASCOPIC INSERTION OF GASTROSTOMY TUBE;  Surgeon: Blush Camellia, MD;  Location: [facility];  Service: General;  Laterality: N/A;   GASTROSTOMY TUBE PLACEMENT Left    06/2018   HIATAL HERNIA REPAIR N/A 06/19/2018   Procedure: LAPAROSCOPIC REPAIR OF HIATAL HERNIA;  Surgeon: Blush Camellia, MD;  Location: [facility];  Service: General;  Laterality: N/A;   LAPAROSCOPIC LYSIS OF ADHESIONS  03/19/2019   Dr. Mitzie Freund   LAPAROSCOPY N/A 06/19/2018   Procedure: LAPAROSCOPY DIAGNOSTIC;  Surgeon: Blush Camellia, MD;  Location: [facility];  Service: General;  Laterality: N/A;   LAPAROSCOPY N/A 03/19/2019   Procedure: LAPAROSCOPY DIAGNOSTIC lysis of adhesions;  Surgeon: Freund Mitzie LABOR, MD;  Location: [facility];  Service: General;  Laterality: N/A;   LUMBAR LAMINECTOMY/DECOMPRESSION MICRODISCECTOMY Right 10/11/2018   Procedure: LAMINECTOMY AND FORAMINOTOMY RIGHT LUMBAR FOUR- LUMBAR FIVE;  Surgeon: Lanis Pupa, MD;  Location: [facility];  Service: Neurosurgery;  Laterality: Right;   MASS EXCISION Right 09/23/2021   Procedure: EXCISION MASS VOLAR ASPECT RIGHT HAND;  Surgeon: Murrell Drivers, MD;  Location: [facility];  Service: Orthopedics;  Laterality: Right;  45 MIN   SHOULDER ARTHROSCOPY  06/2011   left-dsc   TONSILLECTOMY  at age 61   TOTAL SHOULDER ARTHROPLASTY Left 08/11/2020   Procedure: TOTAL SHOULDER ARTHROPLASTY;  Surgeon: Josefina Chew, MD;  Location: [facility];  Service: Orthopedics;  Laterality: Left;   TRIGGER FINGER RELEASE  12/20/2012   Procedure: RELEASE TRIGGER FINGER/A-1 PULLEY;  Surgeon: Drivers JONELLE Murrell, MD;  Location: [name];  Service: Orthopedics;  Laterality: Left;  LEFT TRIGGER THUMB RELEASE   TRIGGER FINGER RELEASE Right 09/23/2021   Procedure: RELEASE A-1 PULLEY RIGHT INDEX FINGER;  Surgeon: Murrell Drivers, MD;  Location: [facility];  Service: Orthopedics;  Laterality: Right;     FAMILY HISTORY: Family History  Problem Relation Age of Onset   Asthma Father    Allergies Father    Heart disease Father        enlarged heart   Peripheral vascular disease Father    Diabetes Father    Hyperlipidemia Father    Arthritis Father    Asthma Sister    Cancer Sister        colon at 53 yr old.   Colon cancer Sister    Allergies Mother    Stroke Mother 30       with hemi paralysis   Diabetes Mother    Hyperlipidemia Mother    Hypertension Mother    GI problems Mother    Arthritis Mother    Allergies Brother    Early death Brother 79       congenital abormality   Allergies Sister    Diabetes Sister    Asthma Sister    Colon polyps Sister    Hyperlipidemia Sister    GI problems Sister        gastroporesis    Liver disease Sister        fatty liver   Stroke Sister        intercrandial bleed   Diabetes Brother    Hypertension Brother    Hyperlipidemia Brother    Heart disease Paternal Aunt    Migraines Neg  Hx      SOCIAL HISTORY: Social History   Socioeconomic History   Marital status: Single    Spouse name: Not on file   Number of children: 0   Years of education: HS   Highest education level: 12th grade  Occupational History   Occupation: disabled    Associate Professor: UNEMPLOYED  Tobacco Use   Smoking status: Never   Smokeless tobacco: Never  Vaping Use   Vaping status: Never Used  Substance and Sexual Activity   Alcohol  use: No   Drug  use: No   Sexual activity: Never    Birth control/protection: Post-menopausal    Comment: LMP 10/2017  Other Topics Concern   Not on file  Social History Narrative   Patient is right handed.   Patient drinks 2 glasses of caffeine  daily.   Lives at home. Her niece is living with her right now.   Social Drivers of Corporate investment banker Strain: Low Risk  (03/22/2024)   Overall Financial Resource Strain (CARDIA)    Difficulty of Paying Living Expenses: Not hard at all  Food Insecurity: No Food Insecurity (03/22/2024)   Hunger Vital Sign    Worried About Running Out of Food in the Last Year: Never true    Ran Out of Food in the Last Year: Never true  Transportation Needs: No Transportation Needs (03/22/2024)   PRAPARE - Administrator, Civil Service (Medical): No    Lack of Transportation (Non-Medical): No  Physical Activity: Insufficiently Active (03/22/2024)   Exercise Vital Sign    Days of Exercise per Week: 3 days    Minutes of Exercise per Session: 30 min  Stress: No Stress Concern Present (03/22/2024)   [employer] of Occupational Health - Occupational Stress Questionnaire    Feeling of Stress : Only a little  Social Connections: Unknown (03/22/2024)   Social Connection and Isolation Panel    Frequency of Communication with Friends and Family: More than three times a week    Frequency of Social Gatherings with Friends and Family: More than three times a week    Attends Religious Services: More than 4 times per year    Active Member of [employer] or Organizations: Patient declined    Attends Banker Meetings: Patient declined    Marital Status: Never married  Intimate Partner Violence: Not At Risk (03/22/2024)   Humiliation, Afraid, Rape, and Kick questionnaire    Fear of Current or Ex-Partner: No    Emotionally Abused: No    Physically Abused: No    Sexually Abused: No     PHYSICAL EXAM  Vitals:   06/03/24 0821  BP: 124/80  Pulse: 64   SpO2: 95%  Weight: 154 lb 12.8 oz (70.2 kg)  Height: 5' 4 (1.626 m)   Body mass index is 26.57 kg/m.  Generalized: Well developed, in no acute distress  Cardiology: normal rate and rhythm, no murmur auscultated  Respiratory: clear to auscultation bilaterally    Neurological examination  Mentation: Alert oriented to time, place, history taking. Follows all commands speech and language fluent Cranial nerve II-XII: Pupils were equal round reactive to light. Extraocular movements were full, visual field were full on confrontational test. Facial sensation and strength were normal. Uvula tongue midline. Head turning and shoulder shrug  were normal and symmetric. Motor: The motor testing reveals 5 over 5 strength of all 4 extremities. Good symmetric motor tone is noted throughout.  Gait and station: Gait is normal.  DIAGNOSTIC DATA (LABS, IMAGING, TESTING) - I reviewed patient records, labs, notes, testing and imaging myself where available.  Lab Results  Component Value Date   WBC 3.2 (L) 05/29/2023   HGB 12.0 05/29/2023   HCT 36.9 05/29/2023   MCV 92 05/29/2023   PLT 228 05/29/2023      Component Value Date/Time   NA 144 11/29/2023 0826   K 5.2 11/29/2023 0826   CL 100 11/29/2023 0826   CO2 25 11/29/2023 0826   GLUCOSE 84 11/29/2023 0826   GLUCOSE 90 08/31/2022 0827   BUN 16 11/29/2023 0826   CREATININE 0.69 11/29/2023 0826   CREATININE 0.82 06/18/2013 1009   CALCIUM  8.9 11/29/2023 0826   PROT 6.1 11/29/2023 0826   ALBUMIN  3.9 11/29/2023 0826   AST 25 11/29/2023 0826   ALT 13 11/29/2023 0826   ALKPHOS 69 11/29/2023 0826   BILITOT <0.2 11/29/2023 0826   GFRNONAA >60 08/31/2022 0827   GFRNONAA 87 06/18/2013 1009   GFRAA >60 08/04/2020 1038   GFRAA >89 06/18/2013 1009   Lab Results  Component Value Date   CHOL 211 (H) 11/29/2023   HDL 77 11/29/2023   LDLCALC 121 (H) 11/29/2023   LDLDIRECT 122 (H) 09/29/2016   TRIG 73 11/29/2023   CHOLHDL 2.7 11/29/2023   Lab  Results  Component Value Date   HGBA1C 5.8 (H) 11/29/2023   Lab Results  Component Value Date   VITAMINB12 1,968 (H) 01/27/2023   Lab Results  Component Value Date   TSH 2.210 11/29/2023       07/19/2018    9:21 AM 07/10/2017    9:00 AM 06/29/2016    9:19 AM  MMSE - Mini Mental State Exam  Orientation to time 5 5  5    Orientation to Place 5 5  5    Registration 3 3  3    Attention/ Calculation 5 5  5    Recall 3 3  3    Language- name 2 objects 2 2  2    Language- repeat 1 1 1   Language- follow 3 step command 3 3  3    Language- read & follow direction 1 1  1    Write a sentence 1 1  1    Copy design 1 1  1    Total score 30 30  30       Data saved with a previous flowsheet row definition         No data to display           ASSESSMENT AND PLAN  58 y.o. year old female  has a past medical history of Allergic rhinitis, Anemia, Anxiety, Barrett's esophagus, Bipolar affective disorder (HCC), CAP (community acquired pneumonia) (07/20/2018), Chronic respiratory failure (HCC), Constipation, chronic, Degenerative joint disease of spine, Depression, Diabetes mellitus without complication (HCC), DM (diabetes mellitus) (HCC), Dry eye, Family history of adverse reaction to anesthesia, Gastroparesis, GERD (gastroesophageal reflux disease), H/O shoulder replacement (08/11/2020), History of bariatric surgery (03/2017), History of hiatal hernia, HLD (hyperlipidemia) (03/12/2013), Hyperlipidemia, Hypertension, Intractable chronic migraine without aura (06/04/2015), Migraine headache, Morbid obesity (HCC), OSA (obstructive sleep apnea), Osteoarthritis, SBO (small bowel obstruction) (HCC) (03/19/2019), Sleep apnea, Unspecified hypothyroidism, Vitamin B 12 deficiency, and Vitamin D  deficiency. here with    Migraine with aura and without status migrainosus, not intractable  NITIKA JACKOWSKI reports improved tremor after weaning divalproex , however, headaches have increased. Emgality  not lasting the full  month. We will switch her to Qulipta 60mg  daily. Switch rizatriptan  to sumatriptan . Possible side effects and appropriate  dosing discussed. Advised to avoid estrogen. Healthy lifestyle habits encouraged. She will follow up with PCP as directed. She will return to see me in 6 months, sooner if needed. She verbalizes understanding and agreement with this plan.   No orders of the defined types were placed in this encounter.    Meds ordered this encounter  Medications   Atogepant (QULIPTA) 60 MG TABS    Sig: Take 1 tablet (60 mg total) by mouth daily.    Dispense:  90 tablet    Refill:  3    Supervising Provider:   AHERN, ANTONIA B [[id number]]   SUMAtriptan  (IMITREX ) 100 MG tablet    Sig: Take 1 tablet (100 mg total) by mouth once as needed for up to 1 dose for migraine. May repeat in 2 hours if headache persists or recurs.    Dispense:  10 tablet    Refill:  11    Supervising Provider:   AHERN, ANTONIA B [[id number]]   gabapentin  (NEURONTIN ) 300 MG capsule    Sig: Takes 2 capsules in morning, one cap at lunch, 2 caps at bedtime    Dispense:  450 capsule    Refill:  3    Supervising Provider:   AHERN, ANTONIA B [[id number]]   I personally spent a total of 30 minutes in the care of the patient today including preparing to see the patient, getting/reviewing separately obtained history, performing a medically appropriate exam/evaluation, counseling and educating, placing orders, and documenting clinical information in the EHR.   Greig Forbes, MSN, FNP-C 06/03/2024, 8:57 AM  [facility] [address] [place], [place] [address] [phone]

## 2024-06-03 NOTE — Patient Instructions (Signed)
 Below is our plan:  We will switch Emgality  injections to Qulipta 60mg  daily. Switch rizatriptan  to sumatriptan . Please take 1 tablet at onset of headache. May take 1 additional tablet in 2 hours if needed. Do not take more than 2 tablets in 24 hours or more than 10 in a month.   I advise against use of estrogen due to history of migraine with aura   Please make sure you are staying well hydrated. I recommend 50-60 ounces daily. Well balanced diet and regular exercise encouraged. Consistent sleep schedule with 6-8 hours recommended.   Please continue follow up with care team as directed.   Follow up with me in 6 months. If doing well, we can push follow up to 1 year   You may receive a survey regarding today's visit. I encourage you to leave honest feed back as I do use this information to improve patient care. Thank you for seeing me today!   GENERAL HEADACHE INFORMATION:   Natural supplements: Magnesium  Oxide or Magnesium  Glycinate 500 mg at bed (up to 800 mg daily) Coenzyme Q10 300 mg in AM Vitamin B2- 200 mg twice a day   Add 1 supplement at a time since even natural supplements can have undesirable side effects. You can sometimes buy supplements cheaper (especially Coenzyme Q10) at www.WebmailGuide.co.za or at [facility].  Migraine with aura: There is increased risk for stroke in women with migraine with aura and a contraindication for the combined contraceptive pill for use by women who have migraine with aura. The risk for women with migraine without aura is lower. However other risk factors like smoking are far more likely to increase stroke risk than migraine. There is a recommendation for no smoking and for the use of OCPs without estrogen such as progestogen only pills particularly for women with migraine with aura.SABRA People who have migraine headaches with auras may be 3 times more likely to have a stroke caused by a blood clot, compared to migraine patients who don't see auras. Women who take  hormone-replacement therapy may be 30 percent more likely to suffer a clot-based stroke than women not taking medication containing estrogen. Other risk factors like smoking and high blood pressure may be  much more important.    Vitamins and herbs that show potential:   Magnesium : Magnesium  (250 mg twice a day or 500 mg at bed) has a relaxant effect on smooth muscles such as blood vessels. Individuals suffering from frequent or daily headache usually have low magnesium  levels which can be increase with daily supplementation of 400-750 mg. Three trials found 40-90% average headache reduction  when used as a preventative. Magnesium  may help with headaches are aura, the best evidence for magnesium  is for migraine with aura is its thought to stop the cortical spreading depression we believe is the pathophysiology of migraine aura.Magnesium  also demonstrated the benefit in menstrually related migraine.  Magnesium  is part of the messenger system in the serotonin cascade and it is a good muscle relaxant.  It is also useful for constipation which can be a side effect of other medications used to treat migraine. Good sources include nuts, whole grains, and tomatoes. Side Effects: loose stool/diarrhea  Riboflavin (vitamin B 2) 200 mg twice a day. This vitamin assists nerve cells in the production of ATP a principal energy storing molecule.  It is necessary for many chemical reactions in the body.  There have been at least 3 clinical trials of riboflavin using 400 mg per day all of  which suggested that migraine frequency can be decreased.  All 3 trials showed significant improvement in over half of migraine sufferers.  The supplement is found in bread, cereal, milk, meat, and poultry.  Most Americans get more riboflavin than the recommended daily allowance, however riboflavin deficiency is not necessary for the supplements to help prevent headache. Side effects: energizing, green urine   Coenzyme Q10: This is present  in almost all cells in the body and is critical component for the conversion of energy.  Recent studies have shown that a nutritional supplement of CoQ10 can reduce the frequency of migraine attacks by improving the energy production of cells as with riboflavin.  Doses of 150 mg twice a day have been shown to be effective.   Melatonin: Increasing evidence shows correlation between melatonin secretion and headache conditions.  Melatonin supplementation has decreased headache intensity and duration.  It is widely used as a sleep aid.  Sleep is natures way of dealing with migraine.  A dose of 3 mg is recommended to start for headaches including cluster headache. Higher doses up to 15 mg has been reviewed for use in Cluster headache and have been used. The rationale behind using melatonin for cluster is that many theories regarding the cause of Cluster headache center around the disruption of the normal circadian rhythm in the brain.  This helps restore the normal circadian rhythm.   HEADACHE DIET: Foods and beverages which may trigger migraine Note that only 20% of headache patients are food sensitive. You will know if you are food sensitive if you get a headache consistently 20 minutes to 2 hours after eating a certain food. Only cut out a food if it causes headaches, otherwise you might remove foods you enjoy! What matters most for diet is to eat a well balanced healthy diet full of vegetables and low fat protein, and to not miss meals.   Chocolate, other sweets ALL cheeses except cottage and cream cheese Dairy products, yogurt, sour cream, ice cream Liver Meat extracts (Bovril, Marmite, meat tenderizers) Meats or fish which have undergone aging, fermenting, pickling or smoking. These include: Hotdogs,salami,Lox,sausage, mortadellas,smoked salmon, pepperoni, Pickled herring Pods of broad bean (English beans, Chinese pea pods, [place] (fava) beans, lima and navy beans Ripe avocado, ripe banana Yeast  extracts or active yeast preparations such as Brewer's or Fleishman's (commercial bakes goods are permitted) Tomato based foods, pizza (lasagna, etc.)   MSG (monosodium glutamate) is disguised as many things; look for these common aliases: Monopotassium glutamate Autolysed yeast Hydrolysed protein Sodium caseinate "flavorings" "all natural preservatives Nutrasweet   Avoid all other foods that convincingly provoke headaches.   Resources: The Dizzy Bluford Aid Your Headache Diet, migrainestrong.com  https://zamora-andrews.com/   Caffeine  and Migraine For patients that have migraine, caffeine  intake more than 3 days per week can lead to dependency and increased migraine frequency. I would recommend cutting back on your caffeine  intake as best you can. The recommended amount of caffeine  is 200-300 mg daily, although migraine patients may experience dependency at even lower doses. While you may notice an increase in headache temporarily, cutting back will be helpful for headaches in the long run. For more information on caffeine  and migraine, visit: https://americanmigrainefoundation.org/resource-library/caffeine -and-migraine/   Headache Prevention Strategies:   1. Maintain a headache diary; learn to identify and avoid triggers.  - This can be a simple note where you log when you had a headache, associated symptoms, and medications used - There are several smartphone apps developed to help track migraines:  Migraine Buddy, Migraine Monitor, Curelator N1-Headache App   Common triggers include: Emotional triggers: Emotional/Upset family or friends Emotional/Upset occupation Business reversal/success Anticipation anxiety Crisis-serious Post-crisis periodNew job/position   Physical triggers: Vacation Day Weekend Strenuous Exercise High Altitude Location New Move Menstrual Day Physical Illness Oversleep/Not enough sleep Weather  changes Light: Photophobia or light sesnitivity treatment involves a balance between desensitization and reduction in overly strong input. Use dark polarized glasses outside, but not inside. Avoid bright or fluorescent light, but do not dim environment to the point that going into a normally lit room hurts. Consider FL-41 tint lenses, which reduce the most irritating wavelengths without blocking too much light.  These can be obtained at axonoptics.com or theraspecs.com Foods: see list above.   2. Limit use of acute treatments (over-the-counter medications, triptans, etc.) to no more than 2 days per week or 10 days per month to prevent medication overuse headache (rebound headache).     3. Follow a regular schedule (including weekends and holidays): Don't skip meals. Eat a balanced diet. 8 hours of sleep nightly. Minimize stress. Exercise 30 minutes per day. Being overweight is associated with a 5 times increased risk of chronic migraine. Keep well hydrated and drink 6-8 glasses of water  per day.   4. Initiate non-pharmacologic measures at the earliest onset of your headache. Rest and quiet environment. Relax and reduce stress. Breathe2Relax is a free app that can instruct you on    some simple relaxtion and breathing techniques. Http://Dawnbuse.com is a    free website that provides teaching videos on relaxation.  Also, there are  many apps that   can be downloaded for "mindful" relaxation.  An app called YOGA NIDRA will help walk you through mindfulness. Another app called Calm can be downloaded to give you a structured mindfulness guide with daily reminders and skill development. Headspace for guided meditation Mindfulness Based Stress Reduction Online Course: www.palousemindfulness.com Cold compresses.   5. Don't wait!! Take the maximum allowable dosage of prescribed medication at the first sign of migraine.   6. Compliance:  Take prescribed medication regularly as directed and at the first  sign of a migraine.   7. Communicate:  Call your physician when problems arise, especially if your headaches change, increase in frequency/severity, or become associated with neurological symptoms (weakness, numbness, slurred speech, etc.). Proceed to emergency room if you experience new or worsening symptoms or symptoms do not resolve, if you have new neurologic symptoms or if headache is severe, or for any concerning symptom.   8. Headache/pain management therapies: Consider various complementary methods, including medication, behavioral therapy, psychological counselling, biofeedback, massage therapy, acupuncture, dry needling, and other modalities.  Such measures may reduce the need for medications. Counseling for pain management, where patients learn to function and ignore/minimize their pain, seems to work very well.   9. Recommend changing family's attention and focus away from patient's headaches. Instead, emphasize daily activities. If first question of day is 'How are your headaches/Do you have a headache today?', then patient will constantly think about headaches, thus making them worse. Goal is to re-direct attention away from headaches, toward daily activities and other distractions.   10. Helpful Websites: www.AmericanHeadacheSociety.org PatentHood.ch www.headaches.org TightMarket.nl www.achenet.org

## 2024-06-04 ENCOUNTER — Telehealth: Payer: Self-pay | Admitting: Pharmacist

## 2024-06-04 NOTE — Telephone Encounter (Signed)
 Pharmacy Patient Advocate Encounter  Received notification from [employer] that Prior Authorization for Qulipta 60MG  tablets has been APPROVED from 06/04/2024 to 12/11/2024   PA #/Case ID/Reference #: [id number]

## 2024-06-04 NOTE — Telephone Encounter (Signed)
 Pharmacy Patient Advocate Encounter   Received notification from Patient Pharmacy that prior authorization for Qulipta 60MG  tablets is required/requested.   Insurance verification completed.   The patient is insured through [facility] .   Per test claim: PA required; PA submitted to above mentioned insurance via CoverMyMeds Key/confirmation #/EOC [facility] Status is pending

## 2024-06-17 ENCOUNTER — Encounter: Payer: Self-pay | Admitting: Family Medicine

## 2024-06-24 ENCOUNTER — Other Ambulatory Visit: Payer: Self-pay | Admitting: Family Medicine

## 2024-06-24 ENCOUNTER — Encounter: Payer: Self-pay | Admitting: Family Medicine

## 2024-06-24 MED ORDER — ERENUMAB-AOOE 140 MG/ML ~~LOC~~ SOAJ: 140.0000 mg | SUBCUTANEOUS | 3 refills | Status: AC

## 2024-06-24 NOTE — Telephone Encounter (Signed)
 Per previous my chart message

## 2024-06-25 ENCOUNTER — Encounter: Payer: Medicare Other | Admitting: Family Medicine

## 2024-06-26 ENCOUNTER — Other Ambulatory Visit (HOSPITAL_COMMUNITY): Payer: Self-pay

## 2024-06-26 ENCOUNTER — Telehealth: Payer: Self-pay

## 2024-06-26 NOTE — Telephone Encounter (Signed)
 Morgan [name]

## 2024-06-26 NOTE — Telephone Encounter (Signed)
   Pharmacy Patient Advocate Encounter   Received notification from Fax that prior authorization for  is required/requested.   Insurance verification completed.   The patient is insured through [facility] .   Per test claim: Refill too soon. PA is not needed at this time. Medication was filled 06/24/2024. Next eligible fill date is 07/17/2024.

## 2024-06-27 ENCOUNTER — Encounter: Payer: Self-pay | Admitting: Family Medicine

## 2024-06-27 NOTE — Telephone Encounter (Signed)
 FYI

## 2024-06-28 ENCOUNTER — Encounter: Payer: Self-pay | Admitting: Family Medicine

## 2024-06-28 ENCOUNTER — Ambulatory Visit: Payer: Medicare Other | Admitting: Family Medicine

## 2024-06-28 VITALS — BP 108/76 | HR 68 | Temp 98.6°F | Ht 64.0 in | Wt 149.0 lb

## 2024-06-28 DIAGNOSIS — Z789 Other specified health status: Secondary | ICD-10-CM

## 2024-06-28 DIAGNOSIS — E039 Hypothyroidism, unspecified: Secondary | ICD-10-CM | POA: Diagnosis not present

## 2024-06-28 DIAGNOSIS — E538 Deficiency of other specified B group vitamins: Secondary | ICD-10-CM | POA: Diagnosis not present

## 2024-06-28 DIAGNOSIS — Z0001 Encounter for general adult medical examination with abnormal findings: Secondary | ICD-10-CM | POA: Diagnosis not present

## 2024-06-28 DIAGNOSIS — E785 Hyperlipidemia, unspecified: Secondary | ICD-10-CM | POA: Diagnosis not present

## 2024-06-28 DIAGNOSIS — E1169 Type 2 diabetes mellitus with other specified complication: Secondary | ICD-10-CM | POA: Diagnosis not present

## 2024-06-28 DIAGNOSIS — M4306 Spondylolysis, lumbar region: Secondary | ICD-10-CM

## 2024-06-28 DIAGNOSIS — E1159 Type 2 diabetes mellitus with other circulatory complications: Secondary | ICD-10-CM | POA: Diagnosis not present

## 2024-06-28 DIAGNOSIS — K5909 Other constipation: Secondary | ICD-10-CM

## 2024-06-28 DIAGNOSIS — E559 Vitamin D deficiency, unspecified: Secondary | ICD-10-CM | POA: Diagnosis not present

## 2024-06-28 DIAGNOSIS — Z0000 Encounter for general adult medical examination without abnormal findings: Secondary | ICD-10-CM

## 2024-06-28 DIAGNOSIS — Z9884 Bariatric surgery status: Secondary | ICD-10-CM

## 2024-06-28 DIAGNOSIS — I152 Hypertension secondary to endocrine disorders: Secondary | ICD-10-CM | POA: Diagnosis not present

## 2024-06-28 LAB — BAYER DCA HB A1C WAIVED: HB A1C (BAYER DCA - WAIVED): 5.1 % (ref 4.8–5.6)

## 2024-06-28 MED ORDER — METHYLPREDNISOLONE ACETATE 40 MG/ML IJ SUSP: 40.0000 mg | Freq: Once | INTRAMUSCULAR | Status: DC

## 2024-06-28 MED ORDER — CYCLOBENZAPRINE HCL 10 MG PO TABS: 10.0000 mg | ORAL_TABLET | Freq: Three times a day (TID) | ORAL | 0 refills | Status: AC | PRN

## 2024-06-28 NOTE — Patient Instructions (Signed)
 Preventive Care 16-57 Years Old, Female  Preventive care refers to lifestyle choices and visits with your health care provider that can promote health and wellness. Preventive care visits are also called wellness exams.  What can I expect for my preventive care visit?  Counseling  Your health care provider may ask you questions about your:  Medical history, including:  Past medical problems.  Family medical history.  Pregnancy history.  Current health, including:  Menstrual cycle.  Method of birth control.  Emotional well-being.  Home life and relationship well-being.  Sexual activity and sexual health.  Lifestyle, including:  Alcohol, nicotine or tobacco, and drug use.  Access to firearms.  Diet, exercise, and sleep habits.  Work and work Astronomer.  Sunscreen use.  Safety issues such as seatbelt and bike helmet use.  Physical exam  Your health care provider will check your:  Height and weight. These may be used to calculate your BMI (body mass index). BMI is a measurement that tells if you are at a healthy weight.  Waist circumference. This measures the distance around your waistline. This measurement also tells if you are at a healthy weight and may help predict your risk of certain diseases, such as type 2 diabetes and high blood pressure.  Heart rate and blood pressure.  Body temperature.  Skin for abnormal spots.  What immunizations do I need?    Vaccines are usually given at various ages, according to a schedule. Your health care provider will recommend vaccines for you based on your age, medical history, and lifestyle or other factors, such as travel or where you work.  What tests do I need?  Screening  Your health care provider may recommend screening tests for certain conditions. This may include:  Lipid and cholesterol levels.  Diabetes screening. This is done by checking your blood sugar (glucose) after you have not eaten for a while (fasting).  Pelvic exam and Pap test.  Hepatitis B test.  Hepatitis C  test.  HIV (human immunodeficiency virus) test.  STI (sexually transmitted infection) testing, if you are at risk.  Lung cancer screening.  Colorectal cancer screening.  Mammogram. Talk with your health care provider about when you should start having regular mammograms. This may depend on whether you have a family history of breast cancer.  BRCA-related cancer screening. This may be done if you have a family history of breast, ovarian, tubal, or peritoneal cancers.  Bone density scan. This is done to screen for osteoporosis.  Talk with your health care provider about your test results, treatment options, and if necessary, the need for more tests.  Follow these instructions at home:  Eating and drinking    Eat a diet that includes fresh fruits and vegetables, whole grains, lean protein, and low-fat dairy products.  Take vitamin and mineral supplements as recommended by your health care provider.  Do not drink alcohol if:  Your health care provider tells you not to drink.  You are pregnant, may be pregnant, or are planning to become pregnant.  If you drink alcohol:  Limit how much you have to 0-1 drink a day.  Know how much alcohol is in your drink. In the [place] one drink equals one 12 oz bottle of beer (355 mL), one 5 oz glass of wine (148 mL), or one 1 oz glass of hard liquor (44 mL).  Lifestyle  Brush your teeth every morning and night with fluoride toothpaste. Floss one time each day.  Exercise for at least  30 minutes 5 or more days each week.  Do not use any products that contain nicotine or tobacco. These products include cigarettes, chewing tobacco, and vaping devices, such as e-cigarettes. If you need help quitting, ask your health care provider.  Do not use drugs.  If you are sexually active, practice safe sex. Use a condom or other form of protection to prevent STIs.  If you do not wish to become pregnant, use a form of birth control. If you plan to become pregnant, see your health care provider for a  prepregnancy visit.  Take aspirin only as told by your health care provider. Make sure that you understand how much to take and what form to take. Work with your health care provider to find out whether it is safe and beneficial for you to take aspirin daily.  Find healthy ways to manage stress, such as:  Meditation, yoga, or listening to music.  Journaling.  Talking to a trusted person.  Spending time with friends and family.  Minimize exposure to UV radiation to reduce your risk of skin cancer.  Safety  Always wear your seat belt while driving or riding in a vehicle.  Do not drive:  If you have been drinking alcohol. Do not ride with someone who has been drinking.  When you are tired or distracted.  While texting.  If you have been using any mind-altering substances or drugs.  Wear a helmet and other protective equipment during sports activities.  If you have firearms in your house, make sure you follow all gun safety procedures.  Seek help if you have been physically or sexually abused.  What's next?  Visit your health care provider once a year for an annual wellness visit.  Ask your health care provider how often you should have your eyes and teeth checked.  Stay up to date on all vaccines.  This information is not intended to replace advice given to you by your health care provider. Make sure you discuss any questions you have with your health care provider.  Document Revised: 05/26/2021 Document Reviewed: 05/26/2021  Elsevier Patient Education  2024 [employer].

## 2024-06-28 NOTE — Progress Notes (Signed)
 Morgan [name] is a 58 y.o. female presents to office today for annual physical exam examination.    Concerns today include: 1. Type 2 Diabetes with hypertension, hyperlipidemia w/ h.o gastric bypass/ Vit Deficiency:  Glucometer: Freestyle libre Has been diet controlled and she in fact monitors blood sugar very closely because she has had issues with hypoglycemia after gastric bypass.  She brings her sugars in today on average they are running anywhere between 80s and low 100s.  Nothing below 70 and nothing above 115.  She has been trying to watch her carb intake and increase exercise.  She has lost some weight as a result of this  Last eye exam: needs.  Has scheduled tomorrow Last foot exam: needs Last A1c:  Lab Results  Component Value Date   HGBA1C 5.8 (H) 11/29/2023   Nephropathy screen indicated?: needs Last flu, zoster and/or pneumovax:  Immunization History  Administered Date(s) Administered   Influenza Inj Mdck Quad With Preservative 09/11/2017   Influenza Split 09/11/2020, 08/31/2023   Influenza,inj,Quad PF,6+ Mos 09/29/2016, 09/07/2017, 09/11/2018, 09/01/2021, 09/05/2022   Influenza,inj,quad, With Preservative 09/17/2019   Influenza-Unspecified 09/11/2016   Moderna Covid-19 Fall Seasonal Vaccine 43yrs & older 12/14/2022   Moderna Covid-19 Vaccine Bivalent Booster 74yrs & up 11/23/2021   Moderna Sars-Covid-2 Vaccination 03/02/2020, 03/30/2020, 10/14/2020   PNEUMOCOCCAL CONJUGATE-20 01/26/2022   Pneumococcal Polysaccharide-23 06/29/2016   Td 07/09/2015   Tdap 07/09/2015   Zoster Recombinant(Shingrix ) 10/06/2017, 01/04/2018    ROS: denies dizziness, LOC, polyuria, polydipsia, unintended weight loss/gain, foot ulcerations, numbness or tingling in extremities, shortness of breath or chest pain.  2.  Low back pain She reports low back pain is not improved with Robaxin -750 she would like to switch over to an alternative muscle relaxant and maybe get a shot of steroid  today.  She is considering seeing a back specialist for back injections but wanted to try this route first  3.  Constipation She reports that she is been having some constipation and GI upset after having med change recently.  She is compliant with her Linzess  145 but may want to increase dose at some point    Substance use: none Health Maintenance Due  Topic Date Due   Hepatitis B Vaccines (1 of 3 - 19+ 3-dose series) Never done   Diabetic kidney evaluation - Urine ACR  01/28/2024   HEMOGLOBIN A1C  05/29/2024   OPHTHALMOLOGY EXAM  06/22/2024   Refills needed today: none  Immunization History  Administered Date(s) Administered   Influenza Inj Mdck Quad With Preservative 09/11/2017   Influenza Split 09/11/2020, 08/31/2023   Influenza,inj,Quad PF,6+ Mos 09/29/2016, 09/07/2017, 09/11/2018, 09/01/2021, 09/05/2022   Influenza,inj,quad, With Preservative 09/17/2019   Influenza-Unspecified 09/11/2016   Moderna Covid-19 Fall Seasonal Vaccine 26yrs & older 12/14/2022   Moderna Covid-19 Vaccine Bivalent Booster 33yrs & up 11/23/2021   Moderna Sars-Covid-2 Vaccination 03/02/2020, 03/30/2020, 10/14/2020   PNEUMOCOCCAL CONJUGATE-20 01/26/2022   Pneumococcal Polysaccharide-23 06/29/2016   Td 07/09/2015   Tdap 07/09/2015   Zoster Recombinant(Shingrix ) 10/06/2017, 01/04/2018   Past Medical History:  Diagnosis Date   Allergic rhinitis    Anemia     after gastric bypass in 2018   Anxiety    Barrett's esophagus    Bipolar affective disorder (HCC)    CAP (community acquired pneumonia) 07/20/2018   Chronic respiratory failure (HCC)    Constipation, chronic    Degenerative joint disease of spine    Depression    Diabetes mellitus without complication (HCC)    type  2    DM (diabetes mellitus) (HCC)    hypoglycemic per pt - no longer takes DM meds due to weight loss from gastric bypass in 2018   Dry eye    Family history of adverse reaction to anesthesia    nausea and vomiting    Gastroparesis    GERD (gastroesophageal reflux disease)    H/O shoulder replacement 08/11/2020   left shoulder   History of bariatric surgery 03/2017   History of hiatal hernia    HLD (hyperlipidemia) 03/12/2013   Hyperlipidemia    Hypertension    No HTN meds since weight loss from Gastric Bypass in 2018   Intractable chronic migraine without aura 06/04/2015   Migraine headache    Morbid obesity (HCC)    OSA (obstructive sleep apnea)    No cpap since gastric surgery   Osteoarthritis    bilateral knee   SBO (small bowel obstruction) (HCC) 03/19/2019   Sleep apnea    Unspecified hypothyroidism    Vitamin B 12 deficiency    Vitamin D  deficiency    Social History   Socioeconomic History   Marital status: Single    Spouse name: Not on file   Number of children: 0   Years of education: HS   Highest education level: 12th grade  Occupational History   Occupation: disabled    Associate Professor: UNEMPLOYED  Tobacco Use   Smoking status: Never   Smokeless tobacco: Never  Vaping Use   Vaping status: Never Used  Substance and Sexual Activity   Alcohol  use: No   Drug use: No   Sexual activity: Never    Birth control/protection: Post-menopausal    Comment: LMP 10/2017  Other Topics Concern   Not on file  Social History Narrative   Patient is right handed.   Patient drinks 2 glasses of caffeine  daily.   Lives at home. Her niece is living with her right now.   Social Drivers of Health   Financial Resource Strain: Medium Risk (06/24/2024)   Overall Financial Resource Strain (CARDIA)    Difficulty of Paying Living Expenses: Somewhat hard  Food Insecurity: Patient Declined (06/24/2024)   Hunger Vital Sign    Worried About Running Out of Food in the Last Year: Patient declined    Ran Out of Food in the Last Year: Patient declined  Transportation Needs: No Transportation Needs (06/24/2024)   PRAPARE - Administrator, Civil Service (Medical): No    Lack of Transportation  (Non-Medical): No  Physical Activity: Insufficiently Active (06/24/2024)   Exercise Vital Sign    Days of Exercise per Week: 4 days    Minutes of Exercise per Session: 30 min  Stress: No Stress Concern Present (06/24/2024)   [employer] of Occupational Health - Occupational Stress Questionnaire    Feeling of Stress: Not at all  Social Connections: Moderately Isolated (06/24/2024)   Social Connection and Isolation Panel    Frequency of Communication with Friends and Family: More than three times a week    Frequency of Social Gatherings with Friends and Family: More than three times a week    Attends Religious Services: More than 4 times per year    Active Member of [employer] or Organizations: No    Attends Banker Meetings: Not on file    Marital Status: Never married  Intimate Partner Violence: Not At Risk (03/22/2024)   Humiliation, Afraid, Rape, and Kick questionnaire    Fear of Current or Ex-Partner: No  Emotionally Abused: No    Physically Abused: No    Sexually Abused: No   Past Surgical History:  Procedure Laterality Date   ANKLE ARTHROSCOPY WITH RECONSTRUCTION Right 09/24/2019   Procedure: ANKLE ARTHROSCOPY DEBRIDEMENT TREATMENT OF OSTEOCHONDRAL LESION TALUS. PRONEAL TENDON DEBRIDEMENT;  Surgeon: Elsa Lonni SAUNDERS, MD;  Location: [facility];  Service: Orthopedics;  Laterality: Right;  SURGERY REQUEST TIME 2 HOURS   APPENDECTOMY  1978   CHOLECYSTECTOMY  2005   COLONOSCOPY     disc repair  05/17/2019   with rods, lumbar spine   GASTRIC ROUX-EN-Y N/A 03/21/2017   Procedure: LAPAROSCOPIC ROUX-EN-Y GASTRIC, UPPER ENDO;  Surgeon: Camellia Blush, MD;  Location: [facility];  Service: General;  Laterality: N/A;   GASTROSTOMY N/A 06/19/2018   Procedure: LAPRASCOPIC INSERTION OF GASTROSTOMY TUBE;  Surgeon: Blush Camellia, MD;  Location: [facility];  Service: General;  Laterality: N/A;   GASTROSTOMY TUBE PLACEMENT Left    06/2018   HIATAL HERNIA REPAIR N/A  06/19/2018   Procedure: LAPAROSCOPIC REPAIR OF HIATAL HERNIA;  Surgeon: Blush Camellia, MD;  Location: [facility];  Service: General;  Laterality: N/A;   LAPAROSCOPIC LYSIS OF ADHESIONS  03/19/2019   Dr. Mitzie Freund   LAPAROSCOPY N/A 06/19/2018   Procedure: LAPAROSCOPY DIAGNOSTIC;  Surgeon: Blush Camellia, MD;  Location: [facility];  Service: General;  Laterality: N/A;   LAPAROSCOPY N/A 03/19/2019   Procedure: LAPAROSCOPY DIAGNOSTIC lysis of adhesions;  Surgeon: Freund Mitzie LABOR, MD;  Location: [facility];  Service: General;  Laterality: N/A;   LUMBAR LAMINECTOMY/DECOMPRESSION MICRODISCECTOMY Right 10/11/2018   Procedure: LAMINECTOMY AND FORAMINOTOMY RIGHT LUMBAR FOUR- LUMBAR FIVE;  Surgeon: Lanis Pupa, MD;  Location: [facility];  Service: Neurosurgery;  Laterality: Right;   MASS EXCISION Right 09/23/2021   Procedure: EXCISION MASS VOLAR ASPECT RIGHT HAND;  Surgeon: Murrell Drivers, MD;  Location: [facility];  Service: Orthopedics;  Laterality: Right;  45 MIN   SHOULDER ARTHROSCOPY  06/2011   left-dsc   TONSILLECTOMY  at age 14   TOTAL SHOULDER ARTHROPLASTY Left 08/11/2020   Procedure: TOTAL SHOULDER ARTHROPLASTY;  Surgeon: Josefina Chew, MD;  Location: [facility];  Service: Orthopedics;  Laterality: Left;   TRIGGER FINGER RELEASE  12/20/2012   Procedure: RELEASE TRIGGER FINGER/A-1 PULLEY;  Surgeon: Drivers SAUNDERS Murrell, MD;  Location: [facility];  Service: Orthopedics;  Laterality: Left;  LEFT TRIGGER THUMB RELEASE   TRIGGER FINGER RELEASE Right 09/23/2021   Procedure: RELEASE A-1 PULLEY RIGHT INDEX FINGER;  Surgeon: Murrell Drivers, MD;  Location: [facility];  Service: Orthopedics;  Laterality: Right;   Family History  Problem Relation Age of Onset   Asthma Father    Allergies Father    Heart disease Father        enlarged heart   Peripheral vascular disease Father    Diabetes Father    Hyperlipidemia Father    Arthritis Father    Asthma Sister     Cancer Sister        colon at 46 yr old.   Colon cancer Sister    Allergies Mother    Stroke Mother 37       with hemi paralysis   Diabetes Mother    Hyperlipidemia Mother    Hypertension Mother    GI problems Mother    Arthritis Mother    Allergies Brother    Early death Brother 9       congenital abormality   Allergies Sister  Diabetes Sister    Asthma Sister    Colon polyps Sister    Hyperlipidemia Sister    GI problems Sister        gastroporesis    Liver disease Sister        fatty liver   Stroke Sister        intercrandial bleed   Diabetes Brother    Hypertension Brother    Hyperlipidemia Brother    Heart disease Paternal Aunt    Migraines Neg Hx     Current Outpatient Medications:    cyclobenzaprine (FLEXERIL) 10 MG tablet, Take 1 tablet (10 mg total) by mouth 3 (three) times daily as needed for muscle spasms., Disp: 90 tablet, Rfl: 0   Calcium -Vitamin D -Vitamin K  650-12.5-40 MG-MCG-MCG CHEW, Chew 1 each by mouth 2 (two) times daily., Disp: , Rfl:    desloratadine  (CLARINEX ) 5 MG tablet, TAKE ONE TABLET DAILY, Disp: 90 tablet, Rfl: 1   dicyclomine  (BENTYL ) 20 MG tablet, TAKE 1 TABLET EVERY 8 HOURS AS NEEDED FOR ABDOMINAL CRAMPING, Disp: 30 tablet, Rfl: 5   Erenumab -aooe 140 MG/ML SOAJ, Inject 140 mg into the skin every 30 (thirty) days., Disp: 3 mL, Rfl: 3   ezetimibe  (ZETIA ) 10 MG tablet, Take 1 tablet (10 mg total) by mouth daily., Disp: 90 tablet, Rfl: 3   gabapentin  (NEURONTIN ) 300 MG capsule, Takes 2 capsules in morning, one cap at lunch, 2 caps at bedtime, Disp: 450 capsule, Rfl: 3   Glucagon , rDNA, (GLUCAGON  EMERGENCY IJ), Inject 1 Syringe as directed daily as needed (emergency low blood sugar). , Disp: , Rfl:    glucose blood (ONETOUCH ULTRA) test strip, Check BS four times a day Dx E11.9, Disp: 400 strip, Rfl: 3   levothyroxine  (SYNTHROID ) 112 MCG tablet, TAKE ONE TABLET ONCE DAILY BEFORE BREAKFAST, Disp: 90 tablet, Rfl: 3   LINZESS  145 MCG CAPS  capsule, TAKE (1) CAPSULE DAILY BEFORE BREAKFAST., Disp: 90 capsule, Rfl: 3   loratadine  (CLARITIN ) 10 MG tablet, Take 10 mg by mouth daily as needed for allergies., Disp: , Rfl:    mirabegron  ER (MYRBETRIQ ) 25 MG TB24 tablet, Take 1 tablet (25 mg total) by mouth daily., Disp: 90 tablet, Rfl: 3   mometasone  (NASONEX ) 50 MCG/ACT nasal spray, Place 2 sprays into the nose daily. To replace flonase , Disp: 1 each, Rfl: 12   Multiple Vitamins-Minerals (BARIATRIC MULTIVITAMINS/IRON  PO), Take 1 tablet by mouth daily., Disp: , Rfl:    nystatin  (MYCOSTATIN /NYSTOP ) powder, APPLY TO AFFECTED AREA OF GROIN RASH TWICE A DAY FOR 7 TO 10 DAYS PER FLARE, Disp: 30 g, Rfl: 1   pantoprazole  (PROTONIX ) 40 MG tablet, Take 1 tablet (40 mg total) by mouth daily., Disp: 90 tablet, Rfl: 3   polyethylene glycol (MIRALAX  / GLYCOLAX ) packet, Take 17 g by mouth at bedtime., Disp: , Rfl:    Probiotic Product (PROBIOTIC DAILY PO), Take 1 capsule by mouth daily., Disp: , Rfl:    sertraline  (ZOLOFT ) 100 MG tablet, TAKE 2 TABLETS DAILY AS DIRECTED, Disp: 180 tablet, Rfl: 3   Simethicone  (GAS-X EXTRA STRENGTH) 125 MG CAPS, Take 125 mg by mouth in the morning, at noon, and at bedtime., Disp: , Rfl:    SUMAtriptan  (IMITREX ) 100 MG tablet, Take 1 tablet (100 mg total) by mouth once as needed for up to 1 dose for migraine. May repeat in 2 hours if headache persists or recurs., Disp: 10 tablet, Rfl: 11   ziprasidone  (GEODON ) 60 MG capsule, Take 1 capsule (60 mg total) by  mouth at bedtime., Disp: 90 capsule, Rfl: 3  Current Facility-Administered Medications:    methylPREDNISolone  acetate (DEPO-MEDROL ) injection 40 mg, 40 mg, Intramuscular, Once,   Allergies  Allergen Reactions   Ketoconazole Hives and Swelling    SWELLING REACTION UNSPECIFIED    Pravachol  [Pravastatin  Sodium] Shortness Of Breath, Swelling and Anaphylaxis    Throat swelling   Statins Other (See Comments)    Leg cramps   Tape Dermatitis and Other (See Comments)     Steri-Strips   Codeine Hypertension    increased BP     ROS: Review of Systems Pertinent items noted in HPI and remainder of comprehensive ROS otherwise negative.    Physical exam BP 108/76   Pulse 68   Temp 98.6 F (37 C)   Ht 5' 4 (1.626 m)   LMP 10/17/2017 (Approximate)   SpO2 97%   BMI 26.57 kg/m  General appearance: alert, cooperative, appears stated age, and no distress Head: Normocephalic, without obvious abnormality, atraumatic Eyes: negative findings: lids and lashes normal, conjunctivae and sclerae normal, corneas clear, and pupils equal, round, reactive to light and accomodation Ears: normal TM's and external ear canals both ears Nose: Nares normal. Septum midline. Mucosa normal. No drainage or sinus tenderness. Throat: lips, mucosa, and tongue normal; teeth and gums normal Neck: no adenopathy, no carotid bruit, supple, symmetrical, trachea midline, and thyroid  not enlarged, symmetric, no tenderness/mass/nodules Back: Increased kyphosis of thoracic spine Lungs: clear to auscultation bilaterally Heart: regular rate and rhythm, S1, S2 normal, no murmur, click, rub or gallop Abdomen: soft, non-tender; bowel sounds normal; no masses,  no organomegaly Extremities: extremities normal, atraumatic, no cyanosis or edema Pulses: 2+ and symmetric Skin: She has a postsurgical scar on the mid back with central pore but no palpable cystic formation. Lymph nodes: Cervical, supraclavicular, and axillary nodes normal. Neurologic: Grossly normal      06/28/2024    8:05 AM 03/22/2024    8:18 AM 03/18/2024    9:45 AM  Depression screen PHQ 2/9  Decreased Interest 2  2  Down, Depressed, Hopeless 2 1 3   PHQ - 2 Score 4 1 5   Altered sleeping 1 2 1   Tired, decreased energy 1 1 2   Change in appetite 0 0 0  Feeling bad or failure about yourself  2  3  Trouble concentrating 1  2  Moving slowly or fidgety/restless 0  0  Suicidal thoughts 0  0  PHQ-9 Score 9 4 13   Difficult doing  work/chores Somewhat difficult Somewhat difficult Somewhat difficult      06/28/2024    7:56 AM 03/18/2024    9:45 AM 11/29/2023    8:20 AM 05/29/2023    8:06 AM  GAD 7 : Generalized Anxiety Score  Nervous, Anxious, on Edge 2 1 1 1   Control/stop worrying 2 2 3 3   Worry too much - different things 2 2 3 3   Trouble relaxing 2 1 2 2   Restless 2 0 0 0  Easily annoyed or irritable 2 0 1 0  Afraid - awful might happen 2 0 0 0  Total GAD 7 Score 14 6 10 9   Anxiety Difficulty Somewhat difficult Somewhat difficult Somewhat difficult Somewhat difficult     Assessment/ Plan: Darice JONELLE Quan here for annual physical exam.   Annual physical exam  Spondylolysis of lumbar region - Plan: methylPREDNISolone  acetate (DEPO-MEDROL ) injection 40 mg, cyclobenzaprine (FLEXERIL) 10 MG tablet  Constipation, chronic  Type 2 diabetes mellitus with other specified complication, without long-term current  use of insulin  (HCC) - Plan: CMP14+EGFR, Bayer DCA Hb A1c Waived, CBC, Microalbumin / creatinine urine ratio  Hyperlipidemia associated with type 2 diabetes mellitus (HCC) - Plan: Lipid Panel, CMP14+EGFR  Hypertension associated with diabetes (HCC) - Plan: CMP14+EGFR  Acquired hypothyroidism - Plan: CMP14+EGFR, TSH + free T4  Vitamin B 12 deficiency - Plan: Vitamin B12, CMP14+EGFR, CBC  Vitamin D  deficiency - Plan: VITAMIN D  25 Hydroxy (Vit-D Deficiency, Fractures), CMP14+EGFR  History of Roux-en-Y gastric bypass - Plan: CMP14+EGFR, CBC  Hepatitis B vaccination status unknown - Plan: Hepatitis B surface antibody,quantitative  Depo-Medrol  administered for back pain.  Stop Robaxin , start Flexeril as needed.  She will contact me if she wants referral  Discussed doubling up on her Linzess  and if the 290 mcg that helps then we can switch over med  Fasting labs collected today.  Check urine microalbumin.  Diabetic foot exam performed.  She has diabetic eye exam tomorrow we will have records sent  Check  for anemia, check B12, vitamin D  level given history of Roux-en-Y gastric bypass.  Continue all medications as prescribed.  Will look for hepatitis B immunity and vaccinate if not immune  Counseled on healthy lifestyle choices, including diet (rich in fruits, vegetables and lean meats and low in salt and simple carbohydrates) and exercise (at least 30 minutes of moderate physical activity daily).  Patient to follow up 23m  Lorren Splawn M. Jolinda, DO

## 2024-06-29 DIAGNOSIS — H2513 Age-related nuclear cataract, bilateral: Secondary | ICD-10-CM | POA: Diagnosis not present

## 2024-06-29 DIAGNOSIS — H40033 Anatomical narrow angle, bilateral: Secondary | ICD-10-CM | POA: Diagnosis not present

## 2024-06-29 LAB — CBC
Hematocrit: 44 % (ref 34.0–46.6)
Hemoglobin: 13.4 g/dL (ref 11.1–15.9)
MCH: 29.6 pg (ref 26.6–33.0)
MCHC: 30.5 g/dL — ABNORMAL LOW (ref 31.5–35.7)
MCV: 97 fL (ref 79–97)
Platelets: 260 x10E3/uL (ref 150–450)
RBC: 4.52 x10E6/uL (ref 3.77–5.28)
RDW: 13 % (ref 11.7–15.4)
WBC: 4.3 x10E3/uL (ref 3.4–10.8)

## 2024-06-29 LAB — CMP14+EGFR
ALT: 13 IU/L (ref 0–32)
AST: 22 IU/L (ref 0–40)
Albumin: 4.5 g/dL (ref 3.8–4.9)
Alkaline Phosphatase: 80 IU/L (ref 44–121)
BUN/Creatinine Ratio: 16 (ref 9–23)
BUN: 12 mg/dL (ref 6–24)
Bilirubin Total: 0.3 mg/dL (ref 0.0–1.2)
CO2: 25 mmol/L (ref 20–29)
Calcium: 10.2 mg/dL (ref 8.7–10.2)
Chloride: 97 mmol/L (ref 96–106)
Creatinine, Ser: 0.73 mg/dL (ref 0.57–1.00)
Globulin, Total: 2 g/dL (ref 1.5–4.5)
Glucose: 79 mg/dL (ref 70–99)
Potassium: 4 mmol/L (ref 3.5–5.2)
Sodium: 141 mmol/L (ref 134–144)
Total Protein: 6.5 g/dL (ref 6.0–8.5)
eGFR: 96 mL/min/1.73 (ref >59)

## 2024-06-29 LAB — HEPATITIS B SURFACE ANTIBODY, QUANTITATIVE: Hepatitis B Surf Ab Quant: <3.5 m[IU]/mL — ABNORMAL LOW

## 2024-06-29 LAB — TSH+FREE T4
Free T4: 1.12 ng/dL (ref 0.82–1.77)
TSH: 2 u[IU]/mL (ref 0.450–4.500)

## 2024-06-29 LAB — LIPID PANEL
Chol/HDL Ratio: 3.3 ratio (ref 0.0–4.4)
Cholesterol, Total: 237 mg/dL — ABNORMAL HIGH (ref 100–199)
HDL: 72 mg/dL (ref >39)
LDL Chol Calc (NIH): 145 mg/dL — ABNORMAL HIGH (ref 0–99)
Triglycerides: 117 mg/dL (ref 0–149)
VLDL Cholesterol Cal: 20 mg/dL (ref 5–40)

## 2024-06-29 LAB — MICROALBUMIN / CREATININE URINE RATIO
Creatinine, Urine: 178.5 mg/dL
Microalb/Creat Ratio: 5 mg/g{creat} (ref 0–29)
Microalbumin, Urine: 8.6 ug/mL

## 2024-06-29 LAB — VITAMIN D 25 HYDROXY (VIT D DEFICIENCY, FRACTURES): Vit D, 25-Hydroxy: 65.2 ng/mL (ref 30.0–100.0)

## 2024-06-29 LAB — VITAMIN B12: Vitamin B-12: 1778 pg/mL — ABNORMAL HIGH (ref 232–1245)

## 2024-07-01 ENCOUNTER — Ambulatory Visit: Payer: Self-pay | Admitting: Family Medicine

## 2024-07-02 ENCOUNTER — Encounter: Payer: Self-pay | Admitting: Family Medicine

## 2024-07-02 ENCOUNTER — Other Ambulatory Visit: Payer: Self-pay | Admitting: Family Medicine

## 2024-07-02 DIAGNOSIS — M4306 Spondylolysis, lumbar region: Secondary | ICD-10-CM

## 2024-07-02 DIAGNOSIS — F3170 Bipolar disorder, currently in remission, most recent episode unspecified: Secondary | ICD-10-CM

## 2024-07-02 DIAGNOSIS — K5909 Other constipation: Secondary | ICD-10-CM

## 2024-07-02 DIAGNOSIS — E039 Hypothyroidism, unspecified: Secondary | ICD-10-CM

## 2024-07-02 MED ORDER — PREDNISONE 10 MG (21) PO TBPK: ORAL_TABLET | ORAL | 0 refills | Status: DC

## 2024-07-02 MED ORDER — LINACLOTIDE 290 MCG PO CAPS: 290.0000 ug | ORAL_CAPSULE | Freq: Every day | ORAL | 4 refills | Status: AC

## 2024-07-05 NOTE — Telephone Encounter (Unsigned)
 Copied from CRM [id number]. Topic: General - Other >> Jul 05, 2024  2:37 PM Myrick T wrote: Reason for CRM: patient said she was returning providers call. Please f/u with patient

## 2024-07-08 ENCOUNTER — Other Ambulatory Visit: Payer: Self-pay | Admitting: Family Medicine

## 2024-07-08 DIAGNOSIS — E1169 Type 2 diabetes mellitus with other specified complication: Secondary | ICD-10-CM

## 2024-07-10 ENCOUNTER — Other Ambulatory Visit: Payer: Self-pay | Admitting: Family Medicine

## 2024-07-20 ENCOUNTER — Other Ambulatory Visit: Payer: Self-pay | Admitting: Family Medicine

## 2024-07-20 DIAGNOSIS — K219 Gastro-esophageal reflux disease without esophagitis: Secondary | ICD-10-CM

## 2024-07-22 ENCOUNTER — Ambulatory Visit: Payer: Self-pay | Admitting: Family Medicine

## 2024-07-22 ENCOUNTER — Ambulatory Visit (HOSPITAL_COMMUNITY)
Admission: RE | Admit: 2024-07-22 | Discharge: 2024-07-22 | Disposition: A | Discharge status: Home / Self Care | Payer: Self-pay | Source: Ambulatory Visit | Attending: Family Medicine | Admitting: Family Medicine

## 2024-07-22 DIAGNOSIS — R931 Abnormal findings on diagnostic imaging of heart and coronary circulation: Secondary | ICD-10-CM

## 2024-07-22 DIAGNOSIS — E1169 Type 2 diabetes mellitus with other specified complication: Secondary | ICD-10-CM | POA: Insufficient documentation

## 2024-07-22 DIAGNOSIS — E785 Hyperlipidemia, unspecified: Secondary | ICD-10-CM | POA: Insufficient documentation

## 2024-07-30 ENCOUNTER — Other Ambulatory Visit: Payer: Self-pay | Admitting: Family Medicine

## 2024-07-30 DIAGNOSIS — J301 Allergic rhinitis due to pollen: Secondary | ICD-10-CM

## 2024-08-13 ENCOUNTER — Other Ambulatory Visit: Payer: Self-pay | Admitting: Family Medicine

## 2024-08-13 DIAGNOSIS — F3170 Bipolar disorder, currently in remission, most recent episode unspecified: Secondary | ICD-10-CM

## 2024-08-13 DIAGNOSIS — N3281 Overactive bladder: Secondary | ICD-10-CM

## 2024-08-13 DIAGNOSIS — E1169 Type 2 diabetes mellitus with other specified complication: Secondary | ICD-10-CM

## 2024-08-14 DIAGNOSIS — M25511 Pain in right shoulder: Secondary | ICD-10-CM | POA: Diagnosis not present

## 2024-08-28 DIAGNOSIS — M170 Bilateral primary osteoarthritis of knee: Secondary | ICD-10-CM | POA: Diagnosis not present

## 2024-09-23 ENCOUNTER — Ambulatory Visit (INDEPENDENT_AMBULATORY_CARE_PROVIDER_SITE_OTHER): Admitting: Nurse Practitioner

## 2024-09-23 ENCOUNTER — Encounter (HOSPITAL_BASED_OUTPATIENT_CLINIC_OR_DEPARTMENT_OTHER): Payer: Self-pay | Admitting: Nurse Practitioner

## 2024-09-23 VITALS — BP 138/84 | HR 64 | Ht 64.0 in | Wt 136.9 lb

## 2024-09-23 DIAGNOSIS — R9431 Abnormal electrocardiogram [ECG] [EKG]: Secondary | ICD-10-CM

## 2024-09-23 DIAGNOSIS — E1169 Type 2 diabetes mellitus with other specified complication: Secondary | ICD-10-CM

## 2024-09-23 DIAGNOSIS — E118 Type 2 diabetes mellitus with unspecified complications: Secondary | ICD-10-CM | POA: Diagnosis not present

## 2024-09-23 DIAGNOSIS — R931 Abnormal findings on diagnostic imaging of heart and coronary circulation: Secondary | ICD-10-CM | POA: Diagnosis not present

## 2024-09-23 DIAGNOSIS — E785 Hyperlipidemia, unspecified: Secondary | ICD-10-CM | POA: Diagnosis not present

## 2024-09-23 DIAGNOSIS — Z789 Other specified health status: Secondary | ICD-10-CM | POA: Diagnosis not present

## 2024-09-23 MED ORDER — REPATHA SURECLICK 140 MG/ML ~~LOC~~ SOAJ: 140.0000 mg | SUBCUTANEOUS | 3 refills | Status: DC

## 2024-09-23 MED ORDER — METOPROLOL TARTRATE 50 MG PO TABS: 50.0000 mg | ORAL_TABLET | Freq: Once | ORAL | 0 refills | Status: DC

## 2024-09-23 NOTE — Patient Instructions (Signed)
 Medication Instructions:   Your physician recommends that you continue on your current medications as directed. Please refer to the Current Medication list given to you today.   *If you need a refill on your cardiac medications before your next appointment, please call your pharmacy*  Lab Work:  TODAY!!!! LPA/BMET  If you have labs (blood work) drawn today and your tests are completely normal, you will receive your results only by: MyChart Message (if you have MyChart) OR A paper copy in the mail If you have any lab test that is abnormal or we need to change your treatment, we will call you to review the results.  Testing/Procedures:    Your cardiac CT will be scheduled at one of the below locations:   [facility] [address]  [place], [place] [address] [phone]  If scheduled at the Heart and Vascular Tower at [facility] street, please enter the parking lot using the [address] entrance and use the FREE valet service at the patient drop-off area. Enter the building and check-in with registration on the main floor.   Please follow these instructions carefully (unless otherwise directed):  An IV will be required for this test and Nitroglycerin will be given.    On the Night Before the Test: Be sure to Drink plenty of water . Do not consume any caffeinated/decaffeinated beverages or chocolate 12 hours prior to your test. Do not take any antihistamines 12 hours prior to your test.   On the Day of the Test: Drink plenty of water  until 1 hour prior to the test. Do not eat any food 1 hour prior to test. You may take your regular medications prior to the test.  Take metoprolol (Lopressor) one (1) tablet by mouth ( 50 mg)  two hours prior to test. Patients who wear a continuous glucose monitor MUST remove the device prior to scanning. FEMALES- please wear underwire-free bra if available, avoid dresses & tight clothing      After the  Test: Drink plenty of water . After receiving IV contrast, you may experience a mild flushed feeling. This is normal. On occasion, you may experience a mild rash up to 24 hours after the test. This is not dangerous. If this occurs, you can take Benadryl  25 mg, Zyrtec, Claritin , or Allegra and increase your fluid intake. (Patients taking Tikosyn should avoid Benadryl , and may take Zyrtec, Claritin , or Allegra) If you experience trouble breathing, this can be serious. If it is severe call 911 IMMEDIATELY. If it is mild, please call our office.  We will call to schedule your test 2-4 weeks out understanding that some insurance companies will need an authorization prior to the service being performed.   For more information and frequently asked questions, please visit our website : [email or web address]  For non-scheduling related questions, please contact the cardiac imaging nurse navigator should you have any questions/concerns: Cardiac Imaging Nurse Navigators Direct Office Dial: [phone]   For scheduling needs, including cancellations and rescheduling, please call [place], [phone].   Follow-Up: At [facility], you and your health needs are our priority.  As part of our continuing mission to provide you with exceptional heart care, our providers are all part of one team.  This team includes your primary Cardiologist (physician) and Advanced Practice Providers or APPs (Physician Assistants and Nurse Practitioners) who all work together to provide you with the care you need, when you need it.  Your next appointment:   3 month(s)  Provider:  Rosaline Bane, NP    We recommend signing up for the patient portal called MyChart.  Sign up information is provided on this After Visit Summary.  MyChart is used to connect with patients for Virtual Visits (Telemedicine).  Patients are able to view lab/test results, encounter notes, upcoming appointments, etc.   Non-urgent messages can be sent to your provider as well.   To learn more about what you can do with MyChart, go to [email or web address].   Other Instructions  HOW TO TAKE YOUR BLOOD PRESSURE  Rest 5 minutes before taking your blood pressure. Don't  smoke or drink caffeinated beverages for at least 30 minutes before. Take your blood pressure before (not after) you eat. Sit comfortably with your back supported and both feet on the floor ( don't cross your legs). Elevate your arm to heart level on a table or a desk. Use the proper sized cuff.  It should fit smoothly and snugly around your bare upper arm.  There should be  Enough room to slip a fingertip under the cuff.  The bottom edge of the cuff should be 1 inch above the crease Of the elbow. Please monitor your blood pressure once daily 2 hours after your am medication. Your goal blood pressure Is below 130 (systolic) top number or over 80 ( diastolic) bottom number  ----Avoid cold medicines with D or DM at the end of them----  Adopting a Healthy Lifestyle.   Weight: Know what a healthy weight is for you (roughly BMI <25) and aim to maintain this. You can calculate your body mass index on your smart phone. Unfortunately, this is not the most accurate measure of healthy weight, but it is the simplest measurement to use. A more accurate measurement involves body scanning which measures lean muscle, fat tissue and bony density. We do not have this equipment at [facility].    Diet: Aim for 7+ servings of fruits and vegetables daily Limit animal fats in diet for cholesterol and heart health - choose grass fed whenever available Avoid highly processed foods (fast food burgers, tacos, fried chicken, pizza, hot dogs, french fries)  Saturated fat comes in the form of butter, lard, coconut oil, margarine, partially hydrogenated oils, and fat in meat. These increase your risk of cardiovascular disease.  Use healthy plant oils, such as olive,  canola, soy, corn, sunflower and peanut.  Whole foods such as fruits, vegetables and whole grains have fiber  Men need > 38 grams of fiber per day Women need > 25 grams of fiber per day  Load up on vegetables and fruits - one-half of your plate: Aim for color and variety, and remember that potatoes dont count. Go for whole grains - one-quarter of your plate: Whole wheat, barley, wheat berries, quinoa, oats, brown rice, and foods made with them. If you want pasta, go with whole wheat pasta. Protein power - one-quarter of your plate: Fish, chicken, beans, and nuts are all healthy, versatile protein sources. Limit red meat. You need carbohydrates for energy! The type of carbohydrate is more important than the amount. Choose carbohydrates such as vegetables, fruits, whole grains, beans, and nuts in the place of white rice, white pasta, potatoes (baked or fried), macaroni and cheese, cakes, cookies, and donuts.  If youre thirsty, drink water . Coffee and tea are good in moderation, but skip sugary drinks and limit milk and dairy products to one or two daily servings. Keep sugar intake at 6 teaspoons or 24 grams or LESS  Exercise: Aim for 150 min of moderate intensity exercise weekly for heart health, and weights twice weekly for bone health Stay active - any steps are better than no steps! Aim for 7-9 hours of sleep daily

## 2024-09-23 NOTE — Progress Notes (Signed)
 Cardiology Office Note:  .   Date:  09/23/2024 ID:  Morgan [name], DOB 1966-02-18, MRN [id number] PCP: [name], DO [facility] Providers Cardiologist:  None   Patient Profile: .      PMH Coronary artery calcification CT Calcium  score 07/22/24 CAC Score 176 (97th percentile) LM 0, LAD 89.7, LCx 0, RCA 85.9 Low risk stress test 09/2016 OSA on CPAP Hyperlipidemia Gastroparesis s/p gastric bypass surgery  Seen in 2017 by Dr. Raford for cardiac clearance for bariatric surgery.  She had low risk Myoview  09/2016 and echo 07/2018 that revealed normal LVEF 55 to 60%, G1 DD, no significant valve disease.       History of Present Illness: .   Discussed the use of AI scribe software for clinical note transcription with the patient, who gave verbal consent to proceed.  History of Present Illness Morgan [name] is a very pleasant 58 year old female who is here today for follow-up of  hyperlipidemia and coronary artery calcification. CT calcium  score is 176, with calcification primarily in the left anterior descending artery and the right coronary artery, placing her in 97th percentile for age/sex matched controls. She is active with caring for 2 young children and does some walking outside.  She exercises in her nephew's pool during summer months and engages in online exercises several days per week. Has been unable to tolerate statins due to myalgia and also had an allergic reaction to pravastatin . She is currently taking Zetia  and tolerates it well. Family history is significant for cardiovascular disease, with her father having high cholesterol and her mother having a myocardial infarction at age 32. Her diet includes air-fried meats and she avoids fast food. She uses Pam spray instead of vegetable oil and does not bread her chicken. She often consumes pork sausage, eggs, or bologna for breakfast. She is hypoglycemic and experiences low blood sugar episodes, limiting her  intake of certain carbohydrates.  She notes significant spikes in blood sugar followed by hypoglycemia when eating instant oatmeal, but she has not tried consuming complex carbohydrates and monitoring blood sugar changes afterwards. She experiences occasional lightheadedness, which she attributes to her medications. Her blood pressure was recently noted to be 117/80. She has gastroparesis, which prevents her from taking aspirin  or NSAIDs.  She denies chest pain, shortness of breath, orthopnea, PND, edema, presyncope, syncope, palpitations.  Prior history of hypertension, however she has been off antihypertensive medications since gastric bypass surgery.   Family history: Her family history includes Allergies in her brother, father, mother, and sister; Arthritis in her father and mother; Asthma in her father, sister, and sister; Cancer in her sister; Colon cancer in her sister; Colon polyps in her sister; Diabetes in her brother, father, mother, and sister; Early death (age of onset: 49) in her brother; GI problems in her mother and sister; Heart disease in her father and paternal aunt; Hyperlipidemia in her brother, father, mother, and sister; Hypertension in her brother and mother; Liver disease in her sister; Peripheral vascular disease in her father; Stroke in her sister; Stroke (age of onset: 44) in her mother.   Discussed the use of AI scribe software for clinical note transcription with the patient, who gave verbal consent to proceed.  ASCVD Risk Score: The 10-year ASCVD risk score (Arnett DK, et al., 2019) is: 6.8%   Values used to calculate the score:     Age: 58 years     Clincally relevant sex: Female  Is Non-Hispanic African American: No     Diabetic: Yes     Tobacco smoker: No     Systolic Blood Pressure: 138 mmHg     Is BP treated: Yes     HDL Cholesterol: 72 mg/dL     Total Cholesterol: 237 mg/dL    Diet: Air fryer - chicken tenders no breading with Pam spray [employer] bear  Surveyor, mining sausage, egg, bologna most mornings   Activity: Cares for great-niece who is 1 1/2 y/o and a 2 y/o on 2 days per week Exercises sitting down 3 x per week Walking around outside with children Pool exercise in summer   No results found for: LIPOA    ROS: See HPI       Studies Reviewed: SABRA   EKG Interpretation Date/Time:  Monday September 23 2024 09:25:43 EDT Ventricular Rate:  64 PR Interval:  186 QRS Duration:  102 QT Interval:  400 QTC Calculation: 412 R Axis:   57  Text Interpretation: Normal sinus rhythm Low voltage QRS When compared with ECG of 15-Sep-2021 07:25, No significant change was found Confirmed by Percy Browning [id number]) on 09/23/2024 9:43:38 AM      Risk Assessment/Calculations:             Physical Exam:   VS: BP 138/84   Pulse 64   Ht 5' 4 (1.626 m)   Wt 136 lb 14.4 oz (62.1 kg)   LMP 10/17/2017 (Approximate)   SpO2 99%   BMI 23.50 kg/m   Wt Readings from Last 3 Encounters:  09/23/24 136 lb 14.4 oz (62.1 kg)  06/28/24 149 lb (67.6 kg)  06/03/24 154 lb 12.8 oz (70.2 kg)     GEN: Well nourished, well developed in no acute distress NECK: No JVD; No carotid bruits CARDIAC: RRR, no murmurs, rubs, gallops RESPIRATORY:  Clear to auscultation without rales, wheezing or rhonchi  ABDOMEN: Soft, non-tender, non-distended EXTREMITIES:  No edema; No deformity     ASSESSMENT AND PLAN: .     Assessment & Plan Coronary artery calcification Abnormal EKG CT calcium  score completed 07/22/2024 with CAC of 176 (97th percentile) with calcification in LAD and RCA.  She is generally active and denies chest pain, dyspnea, or other symptoms concerning for angina.  EKG shows stable low voltage QRS, present on previous EKG. family history is significant for stroke in her mother at age 25, and heart disease in both her mother's and father's families.  She is unable to take aspirin  given history of gastroparesis.  LDL 145 on 06/28/2024.  Her PCP mention  starting PCSK9 inhibitor which she is agreeable to consider. -Coronary CTA for ischemia evaluation and mapping of coronary anatomy -Lopressor 50 mg 2 hours prior to CT -Check LP(a) for further risk stratification -Consider PCSK9 inhibitor for management of hyperlipidemia -Monitor blood pressure at home, targeting 130/80 mmHg or lower  Hyperlipidemia LDL goal < 70 Statin intolerance Lipid profile 06/28/2024 with total cholesterol 237, triglycerides 117, HDL 72, LDL-C 145. History of statin myalgia and allergic reaction to pravastatin .  Zetia  is well tolerated. Family history of high cholesterol and early cardiovascular events.  -Check lipoprotein A levels for genetic risk -Initiate PCSK9 inhibitor injections every 14 days, pending insurance approval -Continue Zetia  10 mg daily   Type 2 diabetes mellitus History of type 2 diabetes with episodes of hypoglycemia. Dietary management is challenging due to episodes of hyperglycemia followed by hypoglycemia.  Lengthy discussion about whole-grains as potential option for stabilizing blood sugar.  She is willing to try this and monitor glucose accordingly.  Specifically encouraged her to reduce intake of sausage and bologna. -Encourage dietary modifications to reduce saturated fat - Eat a mostly whole food diet avoiding saturated fat, processed foods, sugar, and other simple carbohydrates  -Aim for at least 150 minutes of moderate exercise weekly, including walking and pool exercises. -Monitor blood sugar levels to manage hypoglycemia -Management per PCP   Plan/Goals: 1: Aim to increase moderate intensity exercise for a goal of at least 150 minutes per week 2: Limit bologna, pork sausage, and other foods high in saturated fat         Dispo: 3 months with me  Signed, Rosaline Bane, NP-C

## 2024-09-24 ENCOUNTER — Telehealth: Payer: Self-pay | Admitting: Pharmacy Technician

## 2024-09-24 LAB — BASIC METABOLIC PANEL WITH GFR
BUN/Creatinine Ratio: 23 (ref 9–23)
BUN: 17 mg/dL (ref 6–24)
CO2: 25 mmol/L (ref 20–29)
Calcium: 9.3 mg/dL (ref 8.7–10.2)
Chloride: 102 mmol/L (ref 96–106)
Creatinine, Ser: 0.73 mg/dL (ref 0.57–1.00)
Glucose: 86 mg/dL (ref 70–99)
Potassium: 4.7 mmol/L (ref 3.5–5.2)
Sodium: 142 mmol/L (ref 134–144)
eGFR: 96 mL/min/1.73 (ref >59)

## 2024-09-24 LAB — LIPOPROTEIN A (LPA): Lipoprotein (a): 16.4 nmol/L

## 2024-09-24 NOTE — Telephone Encounter (Signed)
   Pharmacy Patient Advocate Encounter   Received notification from CoverMyMeds that prior authorization for REPATHA is required/requested.   Insurance verification completed.   The patient is insured through [facility].   Per test claim: PA required; PA submitted to above mentioned insurance via Latent Key/confirmation #/EOC [id number] Status is pending

## 2024-09-24 NOTE — Telephone Encounter (Signed)
 Pharmacy Patient Advocate Encounter  Received notification from [employer] that Prior Authorization for repatha has been APPROVED from 09/24/24 to 03/25/25   PA #/Case ID/Reference #: [id number]

## 2024-09-25 ENCOUNTER — Ambulatory Visit: Payer: Self-pay | Admitting: Nurse Practitioner

## 2024-09-30 ENCOUNTER — Encounter (HOSPITAL_COMMUNITY): Payer: Self-pay

## 2024-10-02 ENCOUNTER — Ambulatory Visit (HOSPITAL_COMMUNITY)
Admission: RE | Admit: 2024-10-02 | Discharge: 2024-10-02 | Disposition: A | Discharge status: Home / Self Care | Source: Ambulatory Visit | Attending: Nurse Practitioner | Admitting: Nurse Practitioner

## 2024-10-02 DIAGNOSIS — R931 Abnormal findings on diagnostic imaging of heart and coronary circulation: Secondary | ICD-10-CM | POA: Diagnosis not present

## 2024-10-02 DIAGNOSIS — I700 Atherosclerosis of aorta: Secondary | ICD-10-CM | POA: Diagnosis not present

## 2024-10-02 DIAGNOSIS — E1169 Type 2 diabetes mellitus with other specified complication: Secondary | ICD-10-CM | POA: Diagnosis not present

## 2024-10-02 DIAGNOSIS — E785 Hyperlipidemia, unspecified: Secondary | ICD-10-CM | POA: Diagnosis not present

## 2024-10-02 DIAGNOSIS — I2510 Atherosclerotic heart disease of native coronary artery without angina pectoris: Secondary | ICD-10-CM | POA: Insufficient documentation

## 2024-10-02 DIAGNOSIS — Z136 Encounter for screening for cardiovascular disorders: Secondary | ICD-10-CM | POA: Diagnosis not present

## 2024-10-02 MED ORDER — IOHEXOL 350 MG/ML SOLN
100.0000 mL | Freq: Once | INTRAVENOUS | Status: AC | PRN
  Administered 2024-10-02: 100 mL via INTRAVENOUS

## 2024-10-02 MED ORDER — NITROGLYCERIN 0.4 MG SL SUBL
0.8000 mg | SUBLINGUAL_TABLET | Freq: Once | SUBLINGUAL | Status: AC
  Administered 2024-10-02: 0.8 mg via SUBLINGUAL

## 2024-10-03 DIAGNOSIS — Z9884 Bariatric surgery status: Secondary | ICD-10-CM | POA: Diagnosis not present

## 2024-10-03 DIAGNOSIS — E162 Hypoglycemia, unspecified: Secondary | ICD-10-CM | POA: Diagnosis not present

## 2024-10-03 DIAGNOSIS — E161 Other hypoglycemia: Secondary | ICD-10-CM | POA: Diagnosis not present

## 2024-10-03 MED ORDER — BLOOD PRESSURE MONITORING KIT: PACK | 0 refills | Status: DC

## 2024-10-14 ENCOUNTER — Encounter (HOSPITAL_BASED_OUTPATIENT_CLINIC_OR_DEPARTMENT_OTHER): Payer: Self-pay

## 2024-10-29 ENCOUNTER — Emergency Department (HOSPITAL_COMMUNITY): Admitting: Certified Registered"

## 2024-10-29 ENCOUNTER — Encounter (HOSPITAL_COMMUNITY): Admission: EM | Disposition: A | Discharge status: Skilled Nursing Facility | Payer: Self-pay | Source: Home / Self Care

## 2024-10-29 ENCOUNTER — Encounter (HOSPITAL_COMMUNITY): Payer: Self-pay

## 2024-10-29 ENCOUNTER — Inpatient Hospital Stay (HOSPITAL_COMMUNITY)
Admission: EM | Admit: 2024-10-29 | Discharge: 2024-11-18 | DRG: 853 | Disposition: A | Discharge status: Skilled Nursing Facility | Attending: Internal Medicine | Admitting: Internal Medicine

## 2024-10-29 ENCOUNTER — Emergency Department (HOSPITAL_COMMUNITY)

## 2024-10-29 ENCOUNTER — Other Ambulatory Visit: Payer: Self-pay

## 2024-10-29 DIAGNOSIS — Z79890 Hormone replacement therapy (postmenopausal): Secondary | ICD-10-CM

## 2024-10-29 DIAGNOSIS — Z59868 Other specified financial insecurity: Secondary | ICD-10-CM

## 2024-10-29 DIAGNOSIS — A419 Sepsis, unspecified organism: Secondary | ICD-10-CM | POA: Diagnosis not present

## 2024-10-29 DIAGNOSIS — X58XXXA Exposure to other specified factors, initial encounter: Secondary | ICD-10-CM | POA: Diagnosis not present

## 2024-10-29 DIAGNOSIS — I110 Hypertensive heart disease with heart failure: Secondary | ICD-10-CM | POA: Diagnosis present

## 2024-10-29 DIAGNOSIS — Z8249 Family history of ischemic heart disease and other diseases of the circulatory system: Secondary | ICD-10-CM

## 2024-10-29 DIAGNOSIS — I2510 Atherosclerotic heart disease of native coronary artery without angina pectoris: Secondary | ICD-10-CM | POA: Diagnosis present

## 2024-10-29 DIAGNOSIS — R6521 Severe sepsis with septic shock: Secondary | ICD-10-CM | POA: Diagnosis not present

## 2024-10-29 DIAGNOSIS — K56609 Unspecified intestinal obstruction, unspecified as to partial versus complete obstruction: Secondary | ICD-10-CM

## 2024-10-29 DIAGNOSIS — Z833 Family history of diabetes mellitus: Secondary | ICD-10-CM

## 2024-10-29 DIAGNOSIS — J9601 Acute respiratory failure with hypoxia: Secondary | ICD-10-CM | POA: Diagnosis present

## 2024-10-29 DIAGNOSIS — K3184 Gastroparesis: Secondary | ICD-10-CM | POA: Diagnosis present

## 2024-10-29 DIAGNOSIS — E039 Hypothyroidism, unspecified: Secondary | ICD-10-CM | POA: Diagnosis present

## 2024-10-29 DIAGNOSIS — Z9049 Acquired absence of other specified parts of digestive tract: Secondary | ICD-10-CM

## 2024-10-29 DIAGNOSIS — Z823 Family history of stroke: Secondary | ICD-10-CM

## 2024-10-29 DIAGNOSIS — E440 Moderate protein-calorie malnutrition: Secondary | ICD-10-CM | POA: Diagnosis present

## 2024-10-29 DIAGNOSIS — G4730 Sleep apnea, unspecified: Secondary | ICD-10-CM | POA: Diagnosis not present

## 2024-10-29 DIAGNOSIS — Z800 Family history of malignant neoplasm of digestive organs: Secondary | ICD-10-CM

## 2024-10-29 DIAGNOSIS — Y831 Surgical operation with implant of artificial internal device as the cause of abnormal reaction of the patient, or of later complication, without mention of misadventure at the time of the procedure: Secondary | ICD-10-CM | POA: Diagnosis not present

## 2024-10-29 DIAGNOSIS — E119 Type 2 diabetes mellitus without complications: Secondary | ICD-10-CM

## 2024-10-29 DIAGNOSIS — E875 Hyperkalemia: Secondary | ICD-10-CM | POA: Diagnosis not present

## 2024-10-29 DIAGNOSIS — F419 Anxiety disorder, unspecified: Secondary | ICD-10-CM | POA: Diagnosis present

## 2024-10-29 DIAGNOSIS — I5041 Acute combined systolic (congestive) and diastolic (congestive) heart failure: Secondary | ICD-10-CM | POA: Diagnosis present

## 2024-10-29 DIAGNOSIS — E1165 Type 2 diabetes mellitus with hyperglycemia: Secondary | ICD-10-CM | POA: Diagnosis present

## 2024-10-29 DIAGNOSIS — I96 Gangrene, not elsewhere classified: Secondary | ICD-10-CM | POA: Diagnosis not present

## 2024-10-29 DIAGNOSIS — E876 Hypokalemia: Secondary | ICD-10-CM | POA: Diagnosis present

## 2024-10-29 DIAGNOSIS — D72818 Other decreased white blood cell count: Secondary | ICD-10-CM | POA: Diagnosis present

## 2024-10-29 DIAGNOSIS — D62 Acute posthemorrhagic anemia: Secondary | ICD-10-CM | POA: Diagnosis not present

## 2024-10-29 DIAGNOSIS — Z560 Unemployment, unspecified: Secondary | ICD-10-CM

## 2024-10-29 DIAGNOSIS — Z885 Allergy status to narcotic agent status: Secondary | ICD-10-CM

## 2024-10-29 DIAGNOSIS — Z91048 Other nonmedicinal substance allergy status: Secondary | ICD-10-CM

## 2024-10-29 DIAGNOSIS — I5181 Takotsubo syndrome: Secondary | ICD-10-CM | POA: Diagnosis present

## 2024-10-29 DIAGNOSIS — Z825 Family history of asthma and other chronic lower respiratory diseases: Secondary | ICD-10-CM

## 2024-10-29 DIAGNOSIS — D6959 Other secondary thrombocytopenia: Secondary | ICD-10-CM | POA: Diagnosis not present

## 2024-10-29 DIAGNOSIS — Z888 Allergy status to other drugs, medicaments and biological substances status: Secondary | ICD-10-CM

## 2024-10-29 DIAGNOSIS — R339 Retention of urine, unspecified: Secondary | ICD-10-CM | POA: Diagnosis present

## 2024-10-29 DIAGNOSIS — Z83719 Family history of colon polyps, unspecified: Secondary | ICD-10-CM

## 2024-10-29 DIAGNOSIS — Z883 Allergy status to other anti-infective agents status: Secondary | ICD-10-CM

## 2024-10-29 DIAGNOSIS — E11649 Type 2 diabetes mellitus with hypoglycemia without coma: Secondary | ICD-10-CM | POA: Diagnosis not present

## 2024-10-29 DIAGNOSIS — S1090XA Unspecified superficial injury of unspecified part of neck, initial encounter: Secondary | ICD-10-CM | POA: Diagnosis not present

## 2024-10-29 DIAGNOSIS — E1152 Type 2 diabetes mellitus with diabetic peripheral angiopathy with gangrene: Secondary | ICD-10-CM | POA: Diagnosis present

## 2024-10-29 DIAGNOSIS — K7200 Acute and subacute hepatic failure without coma: Secondary | ICD-10-CM | POA: Diagnosis present

## 2024-10-29 DIAGNOSIS — R570 Cardiogenic shock: Secondary | ICD-10-CM | POA: Diagnosis present

## 2024-10-29 DIAGNOSIS — K565 Intestinal adhesions [bands], unspecified as to partial versus complete obstruction: Principal | ICD-10-CM

## 2024-10-29 DIAGNOSIS — Z79899 Other long term (current) drug therapy: Secondary | ICD-10-CM

## 2024-10-29 DIAGNOSIS — E785 Hyperlipidemia, unspecified: Secondary | ICD-10-CM | POA: Diagnosis present

## 2024-10-29 DIAGNOSIS — L89156 Pressure-induced deep tissue damage of sacral region: Secondary | ICD-10-CM | POA: Diagnosis present

## 2024-10-29 DIAGNOSIS — D696 Thrombocytopenia, unspecified: Secondary | ICD-10-CM | POA: Diagnosis not present

## 2024-10-29 DIAGNOSIS — K2270 Barrett's esophagus without dysplasia: Secondary | ICD-10-CM | POA: Diagnosis present

## 2024-10-29 DIAGNOSIS — K219 Gastro-esophageal reflux disease without esophagitis: Secondary | ICD-10-CM | POA: Diagnosis present

## 2024-10-29 DIAGNOSIS — I10 Essential (primary) hypertension: Secondary | ICD-10-CM | POA: Diagnosis not present

## 2024-10-29 DIAGNOSIS — F319 Bipolar disorder, unspecified: Secondary | ICD-10-CM | POA: Diagnosis present

## 2024-10-29 DIAGNOSIS — A4151 Sepsis due to Escherichia coli [E. coli]: Principal | ICD-10-CM | POA: Diagnosis present

## 2024-10-29 DIAGNOSIS — Z6821 Body mass index (BMI) 21.0-21.9, adult: Secondary | ICD-10-CM

## 2024-10-29 DIAGNOSIS — I742 Embolism and thrombosis of arteries of the upper extremities: Secondary | ICD-10-CM | POA: Diagnosis not present

## 2024-10-29 DIAGNOSIS — D638 Anemia in other chronic diseases classified elsewhere: Secondary | ICD-10-CM | POA: Diagnosis present

## 2024-10-29 DIAGNOSIS — Z9884 Bariatric surgery status: Secondary | ICD-10-CM

## 2024-10-29 DIAGNOSIS — E872 Acidosis, unspecified: Secondary | ICD-10-CM | POA: Diagnosis present

## 2024-10-29 DIAGNOSIS — K5651 Intestinal adhesions [bands], with partial obstruction: Secondary | ICD-10-CM | POA: Diagnosis present

## 2024-10-29 DIAGNOSIS — R188 Other ascites: Secondary | ICD-10-CM | POA: Diagnosis present

## 2024-10-29 DIAGNOSIS — Z7901 Long term (current) use of anticoagulants: Secondary | ICD-10-CM

## 2024-10-29 DIAGNOSIS — J690 Pneumonitis due to inhalation of food and vomit: Secondary | ICD-10-CM | POA: Diagnosis not present

## 2024-10-29 DIAGNOSIS — E43 Unspecified severe protein-calorie malnutrition: Secondary | ICD-10-CM | POA: Insufficient documentation

## 2024-10-29 DIAGNOSIS — E1143 Type 2 diabetes mellitus with diabetic autonomic (poly)neuropathy: Secondary | ICD-10-CM | POA: Diagnosis present

## 2024-10-29 DIAGNOSIS — N170 Acute kidney failure with tubular necrosis: Secondary | ICD-10-CM | POA: Diagnosis present

## 2024-10-29 DIAGNOSIS — K659 Peritonitis, unspecified: Secondary | ICD-10-CM | POA: Diagnosis present

## 2024-10-29 DIAGNOSIS — T82868A Thrombosis of vascular prosthetic devices, implants and grafts, initial encounter: Secondary | ICD-10-CM | POA: Diagnosis not present

## 2024-10-29 DIAGNOSIS — K859 Acute pancreatitis without necrosis or infection, unspecified: Secondary | ICD-10-CM | POA: Diagnosis present

## 2024-10-29 DIAGNOSIS — Z8261 Family history of arthritis: Secondary | ICD-10-CM

## 2024-10-29 DIAGNOSIS — I82502 Chronic embolism and thrombosis of unspecified deep veins of left lower extremity: Secondary | ICD-10-CM | POA: Diagnosis present

## 2024-10-29 DIAGNOSIS — Z83438 Family history of other disorder of lipoprotein metabolism and other lipidemia: Secondary | ICD-10-CM

## 2024-10-29 DIAGNOSIS — E871 Hypo-osmolality and hyponatremia: Secondary | ICD-10-CM | POA: Diagnosis not present

## 2024-10-29 DIAGNOSIS — Z96612 Presence of left artificial shoulder joint: Secondary | ICD-10-CM | POA: Diagnosis present

## 2024-10-29 DIAGNOSIS — Z9911 Dependence on respirator [ventilator] status: Secondary | ICD-10-CM | POA: Diagnosis not present

## 2024-10-29 HISTORY — PX: LAPAROTOMY: SHX154

## 2024-10-29 LAB — URINALYSIS, W/ REFLEX TO CULTURE (INFECTION SUSPECTED)
Bilirubin Urine: NEGATIVE
Glucose, UA: NEGATIVE mg/dL
Hgb urine dipstick: NEGATIVE
Ketones, ur: NEGATIVE mg/dL
Leukocytes,Ua: NEGATIVE
Nitrite: NEGATIVE
Protein, ur: 30 mg/dL — AB
Specific Gravity, Urine: 1.024 (ref 1.005–1.030)
pH: 5 (ref 5.0–8.0)

## 2024-10-29 LAB — CBC WITH DIFFERENTIAL/PLATELET
Abs Immature Granulocytes: 0.01 K/uL (ref 0.00–0.07)
Basophils Absolute: 0 K/uL (ref 0.0–0.1)
Basophils Relative: 0 %
Eosinophils Absolute: 0 K/uL (ref 0.0–0.5)
Eosinophils Relative: 0 %
HCT: 42.4 % (ref 36.0–46.0)
Hemoglobin: 13 g/dL (ref 12.0–15.0)
Immature Granulocytes: 1 %
Lymphocytes Relative: 9 %
Lymphs Abs: 0.1 K/uL — ABNORMAL LOW (ref 0.7–4.0)
MCH: 29 pg (ref 26.0–34.0)
MCHC: 30.7 g/dL (ref 30.0–36.0)
MCV: 94.4 fL (ref 80.0–100.0)
Monocytes Absolute: 0 K/uL — ABNORMAL LOW (ref 0.1–1.0)
Monocytes Relative: 2 %
Neutro Abs: 1 K/uL — ABNORMAL LOW (ref 1.7–7.7)
Neutrophils Relative %: 88 %
Platelets: 362 K/uL (ref 150–400)
RBC: 4.49 MIL/uL (ref 3.87–5.11)
RDW: 14.6 % (ref 11.5–15.5)
Smear Review: NORMAL
WBC: 1.1 K/uL — CL (ref 4.0–10.5)
nRBC: 0 % (ref 0.0–0.2)

## 2024-10-29 LAB — COMPREHENSIVE METABOLIC PANEL WITH GFR
ALT: 272 U/L — ABNORMAL HIGH (ref 0–44)
AST: 383 U/L — ABNORMAL HIGH (ref 15–41)
Albumin: 3.4 g/dL — ABNORMAL LOW (ref 3.5–5.0)
Alkaline Phosphatase: 127 U/L — ABNORMAL HIGH (ref 38–126)
Anion gap: 14 (ref 5–15)
BUN: 25 mg/dL — ABNORMAL HIGH (ref 6–20)
CO2: 21 mmol/L — ABNORMAL LOW (ref 22–32)
Calcium: 7.9 mg/dL — ABNORMAL LOW (ref 8.9–10.3)
Chloride: 108 mmol/L (ref 98–111)
Creatinine, Ser: 1.12 mg/dL — ABNORMAL HIGH (ref 0.44–1.00)
GFR, Estimated: 57 mL/min — ABNORMAL LOW (ref >60)
Glucose, Bld: 141 mg/dL — ABNORMAL HIGH (ref 70–99)
Potassium: 3.3 mmol/L — ABNORMAL LOW (ref 3.5–5.1)
Sodium: 142 mmol/L (ref 135–145)
Total Bilirubin: 0.3 mg/dL (ref 0.0–1.2)
Total Protein: 5.6 g/dL — ABNORMAL LOW (ref 6.5–8.1)

## 2024-10-29 LAB — I-STAT CG4 LACTIC ACID, ED: Lactic Acid, Venous: 5.1 mmol/L (ref 0.5–1.9)

## 2024-10-29 LAB — MAGNESIUM: Magnesium: 1.7 mg/dL (ref 1.7–2.4)

## 2024-10-29 LAB — PROTIME-INR
INR: 1 (ref 0.8–1.2)
Prothrombin Time: 13.3 s (ref 11.4–15.2)

## 2024-10-29 LAB — LIPASE, BLOOD: Lipase: >2800 U/L — ABNORMAL HIGH (ref 11–51)

## 2024-10-29 MED ORDER — EPINEPHRINE HCL 5 MG/250ML IV SOLN IN NS
0.5000 ug/min | INTRAVENOUS | Status: DC
  Administered 2024-10-29 – 2024-10-30 (×2): 0.5 ug/min via INTRAVENOUS
  Administered 2024-10-31: 4 ug/min via INTRAVENOUS
  Filled 2024-10-29: qty 250

## 2024-10-29 MED ORDER — POTASSIUM CHLORIDE IN NACL 20-0.9 MEQ/L-% IV SOLN: INTRAVENOUS | Status: DC

## 2024-10-29 MED ORDER — ROCURONIUM BROMIDE 10 MG/ML (PF) SYRINGE
PREFILLED_SYRINGE | INTRAVENOUS | Status: DC | PRN
  Administered 2024-10-29: 50 mg via INTRAVENOUS
  Administered 2024-10-29: 20 mg via INTRAVENOUS
  Administered 2024-10-29: 50 mg via INTRAVENOUS
  Administered 2024-10-29: 30 mg via INTRAVENOUS

## 2024-10-29 MED ORDER — ROCURONIUM BROMIDE 10 MG/ML (PF) SYRINGE
PREFILLED_SYRINGE | INTRAVENOUS | Status: AC
  Filled 2024-10-29: qty 10

## 2024-10-29 MED ORDER — LIDOCAINE HCL (PF) 2 % IJ SOLN
INTRAMUSCULAR | Status: AC
  Filled 2024-10-29: qty 5

## 2024-10-29 MED ORDER — ENOXAPARIN SODIUM 40 MG/0.4ML IJ SOSY
40.0000 mg | PREFILLED_SYRINGE | INTRAMUSCULAR | Status: DC
  Administered 2024-10-30: 40 mg via SUBCUTANEOUS
  Filled 2024-10-29: qty 0.4

## 2024-10-29 MED ORDER — SUCCINYLCHOLINE CHLORIDE 200 MG/10ML IV SOSY
PREFILLED_SYRINGE | INTRAVENOUS | Status: DC | PRN
  Administered 2024-10-29: 140 mg via INTRAVENOUS

## 2024-10-29 MED ORDER — ETOMIDATE 2 MG/ML IV SOLN
INTRAVENOUS | Status: AC
Start: 2024-10-29 — End: 2024-10-29
  Filled 2024-10-29: qty 10

## 2024-10-29 MED ORDER — SODIUM CHLORIDE 0.9 % IV BOLUS
1000.0000 mL | Freq: Once | INTRAVENOUS | Status: AC
  Administered 2024-10-29: 1000 mL via INTRAVENOUS

## 2024-10-29 MED ORDER — ALBUMIN HUMAN 5 % IV SOLN: INTRAVENOUS | Status: DC | PRN

## 2024-10-29 MED ORDER — CHLORHEXIDINE GLUCONATE 0.12 % MT SOLN
15.0000 mL | Freq: Once | OROMUCOSAL | Status: AC
  Administered 2024-10-29: 15 mL via OROMUCOSAL

## 2024-10-29 MED ORDER — NOREPINEPHRINE 4 MG/250ML-% IV SOLN
0.0000 ug/min | INTRAVENOUS | Status: DC
  Administered 2024-10-29: 10 ug/min via INTRAVENOUS
  Administered 2024-10-29: 35 ug/min via INTRAVENOUS
  Administered 2024-10-30: 40 ug/min via INTRAVENOUS
  Administered 2024-10-30: 38 ug/min via INTRAVENOUS
  Administered 2024-10-30 (×3): 40 ug/min via INTRAVENOUS
  Filled 2024-10-29: qty 250
  Filled 2024-10-29 (×2): qty 500
  Filled 2024-10-29 (×2): qty 250

## 2024-10-29 MED ORDER — CHLORHEXIDINE GLUCONATE CLOTH 2 % EX PADS
6.0000 | MEDICATED_PAD | Freq: Every day | CUTANEOUS | Status: DC
  Administered 2024-10-29: 6 via TOPICAL

## 2024-10-29 MED ORDER — LIDOCAINE HCL (CARDIAC) PF 100 MG/5ML IV SOSY
PREFILLED_SYRINGE | INTRAVENOUS | Status: DC | PRN
  Administered 2024-10-29: 50 mg via INTRATRACHEAL

## 2024-10-29 MED ORDER — SUCCINYLCHOLINE CHLORIDE 200 MG/10ML IV SOSY
PREFILLED_SYRINGE | INTRAVENOUS | Status: AC
  Filled 2024-10-29: qty 10

## 2024-10-29 MED ORDER — SODIUM CHLORIDE 0.9 % IV SOLN
2.0000 g | Freq: Once | INTRAVENOUS | Status: AC
  Administered 2024-10-29: 2 g via INTRAVENOUS

## 2024-10-29 MED ORDER — 0.9 % SODIUM CHLORIDE (POUR BTL) OPTIME
TOPICAL | Status: DC | PRN
  Administered 2024-10-29: 2000 mL

## 2024-10-29 MED ORDER — PIPERACILLIN-TAZOBACTAM 3.375 G IVPB 30 MIN
3.3750 g | Freq: Once | INTRAVENOUS | Status: AC
  Administered 2024-10-29: 3.375 g via INTRAVENOUS
  Filled 2024-10-29: qty 50

## 2024-10-29 MED ORDER — FAMOTIDINE 20 MG PO TABS: 20.0000 mg | ORAL_TABLET | Freq: Two times a day (BID) | ORAL | Status: DC

## 2024-10-29 MED ORDER — CALCIUM CHLORIDE 10 % IV SOLN
INTRAVENOUS | Status: DC | PRN
  Administered 2024-10-29 (×2): 500 mg via INTRAVENOUS

## 2024-10-29 MED ORDER — SODIUM CHLORIDE (PF) 0.9 % IJ SOLN
INTRAMUSCULAR | Status: AC
  Filled 2024-10-29: qty 20

## 2024-10-29 MED ORDER — FENTANYL CITRATE (PF) 50 MCG/ML IJ SOSY
50.0000 ug | PREFILLED_SYRINGE | Freq: Once | INTRAMUSCULAR | Status: AC
  Administered 2024-10-29: 50 ug via INTRAVENOUS
  Filled 2024-10-29: qty 1

## 2024-10-29 MED ORDER — SODIUM CHLORIDE 0.9 % IV SOLN
1.0000 g | Freq: Once | INTRAVENOUS | Status: DC
  Filled 2024-10-29: qty 10

## 2024-10-29 MED ORDER — IOHEXOL 300 MG/ML  SOLN
100.0000 mL | Freq: Once | INTRAMUSCULAR | Status: AC | PRN
  Administered 2024-10-29: 100 mL via INTRAVENOUS

## 2024-10-29 MED ORDER — PROPOFOL 10 MG/ML IV BOLUS
INTRAVENOUS | Status: DC | PRN
Start: 2024-10-29 — End: 2024-10-29
  Administered 2024-10-29: 50 ug/kg/min via INTRAVENOUS

## 2024-10-29 MED ORDER — PANTOPRAZOLE SODIUM 40 MG IV SOLR
40.0000 mg | Freq: Every day | INTRAVENOUS | Status: DC
  Administered 2024-10-30: 40 mg via INTRAVENOUS
  Filled 2024-10-29: qty 10

## 2024-10-29 MED ORDER — SODIUM CHLORIDE 0.9 % IV BOLUS
1000.0000 mL | Freq: Once | INTRAVENOUS | Status: AC
Start: 2024-10-29 — End: 2024-10-29
  Administered 2024-10-29: 1000 mL via INTRAVENOUS

## 2024-10-29 MED ORDER — LACTATED RINGERS IV SOLN
INTRAVENOUS | Status: DC | PRN
Start: 2024-10-29 — End: 2024-10-29

## 2024-10-29 MED ORDER — VASOPRESSIN 20 UNITS/100 ML INFUSION FOR SHOCK
0.0000 [IU]/min | INTRAVENOUS | Status: DC
Start: 2024-10-29 — End: 2024-11-02
  Administered 2024-10-29: 0.03 [IU]/min via INTRAVENOUS
  Administered 2024-10-30 – 2024-11-01 (×7): 0.04 [IU]/min via INTRAVENOUS
  Filled 2024-10-29 (×8): qty 100

## 2024-10-29 MED ORDER — LACTATED RINGERS IV SOLN: INTRAVENOUS | Status: DC

## 2024-10-29 MED ORDER — PIPERACILLIN-TAZOBACTAM 3.375 G IVPB
3.3750 g | Freq: Three times a day (TID) | INTRAVENOUS | Status: DC
Start: 2024-10-30 — End: 2024-11-04
  Administered 2024-10-30 – 2024-10-31 (×4): 3.375 g via INTRAVENOUS
  Filled 2024-10-29 (×4): qty 50

## 2024-10-29 MED ORDER — LACTATED RINGERS IV BOLUS (SEPSIS)
1000.0000 mL | Freq: Once | INTRAVENOUS | Status: AC
  Administered 2024-10-29: 1000 mL via INTRAVENOUS

## 2024-10-29 MED ORDER — VASOPRESSIN 20 UNIT/ML IV SOLN
INTRAVENOUS | Status: AC
  Filled 2024-10-29: qty 1

## 2024-10-29 MED ORDER — ETOMIDATE 2 MG/ML IV SOLN
INTRAVENOUS | Status: DC | PRN
  Administered 2024-10-29: 12 mg via INTRAVENOUS

## 2024-10-29 MED ORDER — PHENOL 1.4 % MT LIQD: 1.0000 | OROMUCOSAL | Status: DC | PRN

## 2024-10-29 MED ORDER — EPINEPHRINE HCL 5 MG/250ML IV SOLN IN NS
INTRAVENOUS | Status: AC
  Filled 2024-10-29: qty 250

## 2024-10-29 MED ORDER — CALCIUM CHLORIDE 10 % IV SOLN
INTRAVENOUS | Status: AC
  Filled 2024-10-29: qty 10

## 2024-10-29 MED ORDER — HYDROMORPHONE HCL 1 MG/ML IJ SOLN: 1.0000 mg | Freq: Once | INTRAMUSCULAR | Status: DC

## 2024-10-29 MED ORDER — PROPOFOL 1000 MG/100ML IV EMUL
INTRAVENOUS | Status: AC
  Filled 2024-10-29: qty 100

## 2024-10-29 MED ORDER — ALBUMIN HUMAN 5 % IV SOLN
INTRAVENOUS | Status: AC
  Filled 2024-10-29: qty 250

## 2024-10-29 MED ORDER — DEXAMETHASONE SOD PHOSPHATE PF 10 MG/ML IJ SOLN
INTRAMUSCULAR | Status: DC | PRN
  Administered 2024-10-29: 6 mg via INTRAVENOUS

## 2024-10-29 MED ORDER — METRONIDAZOLE 500 MG/100ML IV SOLN
500.0000 mg | Freq: Once | INTRAVENOUS | Status: AC
  Administered 2024-10-29: 500 mg via INTRAVENOUS
  Filled 2024-10-29: qty 100

## 2024-10-29 NOTE — Op Note (Signed)
 Morgan [name] 10/29/2024   Pre-op Diagnosis: SMALL BOWEL OBSTRUCTION WITH POSSIBLE PERFORATION     Post-op Diagnosis: SMALL BOWEL OBSTRUCTION WITH PERFORATION  Procedure(s): EXPLORATORY LAPAROTOMY LYSIS OF ADHESION PLACEMENT OF GASTROSTOMY TUBE PLACEMENT OF ABTHERA VAC  Surgeon(s): Vernetta Berg, MD Eletha Boas, MD  Anesthesia: General  Staff:  Circulator: Terri Eleanor PARAS, RN Relief Scrub: Demetria Marinda BRAVO Scrub Person: Dearman, Deric W  Estimated Blood Loss: Minimal               Indication: This is a 58 year old female with a previous history of Roux-en-Y gastric bypass who presents with severe abdominal pain and intractable nausea and vomiting.  She was acutely ill with a lactic acid of 5.1 and a diffusely tender abdomen.  The decision was made to proceed to the operating for an exploratory laparotomy  Findings: The patient was found to have a dense adhesive band which was likely the remanent of the gastrostomy tube that he created a tight obstruction in the proximal small bowel near the Roux anastomosis.  There was a significant amount of intra-abdominal contamination.  The anastomosis to the gastric pouch appeared intact and the Roux anastomosis was intact as well with no evidence of ischemia or perforation.  The gastric remnant was significantly dilated and slightly ischemic in appearance.  No hole in the gastric remanent, either anastomosis, the proximal stomach, or the duodenum could be identified.  Procedure: The patient was brought to the operating room identified as the correct patient.  She was placed upon the operating table and general anesthesia was induced.  Her abdomen was prepped and draped in the usual sterile fashion.  A midline incision was then created with a #10 blade.  We took this down through the subcutaneous tissue to the fascia which was then open the entire length of the incision.  The patient had extensively dilated small bowel.  We  identified the cecum and distal ileum and then started running the small bowel back proximally.  We reached a very tight anastomosis with a very thick band coming from the gastric remanent and crossing across the small bowel and transverse colon and going down to the mesentery.  The band was transected with the cautery relieving the obstruction.  There was minimal ischemic changes in the small bowel from the band.  The gastric was massively dilated.  The adhesion again appeared like it was from the previous gastrostomy tube and it was leaking bile.  The Roux anastomosis appeared widely patent with no evidence of perforation.  We evaluated the anastomosis to the stomach and this appeared patent as well without evidence of perforation.  We could not completely mobilized the entire gastric ruminative as it was quite dilated but saw no perforation and only mild ischemic changes.  We did take down some adhesions from the loop of the duodenum to the liver looking for an area of perforation.  A small capsular tear in the liver was controlled with a piece of Surgicel and the cautery.  At this point, we could not identify any other signs of perforation.  Again there was extensive fibrinous exudate and fluid in the abdomen.  We decided to place a gastrostomy tube at the area of the remanent from the previous gastrostomy tube.  A 20 French gastrostomy tube was brought to the field.  We made an incision in the patient's left upper quadrant and then using a Kelly clamp created a tract from the peritoneum to the abdominal wall and then  pulled the gastrostomy tube through this.  We tested the balloon and it was patent.  I placed a 2-0 silk pursing suture around the base of the gastric remanent adhesion.  I then excised the remaining adhesion to close to the stomach wall with the cautery.  We then placed the gastrostomy tube through this opening into the stomach.  The balloon was then inflated and I tied the pursestring suture  down.  We then secured the stomach around the gastrostomy tube to the abdominal wall with 2-0 silk sutures.  We then sutured the flange from the gastrostomy tube to the abdominal wall with 2-0 nylon sutures.  We placed the gastrostomy tube to suction decompressing the gastric remanent.  At this point we copiously irrigated the abdomen normal saline.  Again no other site of perforation could be identified.  At this point we decided to leave the abdomen open and place a wound VAC with the plans for reexploration with potential upper endoscopy as well as a contrast study through the G-tube in 24 to 48 hours.  The ABThera wound VAC was placed.  Sponges were placed over this and then the vacuum device was applied and good suction was achieved. All counts were correct at the end of the procedure.  The patient was then taken, still intubated from the operating room to the intensive care unit          [name]   Date: 10/29/2024  Time: 10:37 PM

## 2024-10-29 NOTE — Anesthesia Procedure Notes (Signed)
 Arterial Line Insertion Start/End11/18/2025 8:54 PM, 10/29/2024 8:59 PM Performed by: Leopoldo Bruckner, MD  Patient location: Pre-op. Preanesthetic checklist: patient identified, IV checked, site marked, risks and benefits discussed, surgical consent, monitors and equipment checked, pre-op evaluation, timeout performed and anesthesia consent Left, radial was placed Catheter size: 20 G Hand hygiene performed  and maximum sterile barriers used   Attempts: 1 Following insertion, dressing applied and Biopatch. Post procedure assessment: normal and unchanged  Patient tolerated the procedure well with no immediate complications.

## 2024-10-29 NOTE — H&P (Signed)
 Morgan [name] is an 58 y.o. female.   Chief Complaint: Abdominal pain HPI: This is a 58 year old female with a prior history of a laparoscopic Roux-en-Y gastric bypass in 2018 as well as surgery for a bowel obstruction secondary adhesions in 2020 who presents with abdominal pain with intractable nausea and vomiting.  She reports she is doing doing well until yesterday when this started.  She was seen yesterday in another facility and a CT scan was performed which normal.  She presented's afternoon as her condition was worsening.  Her last bowel movement was yesterday.  Pain is moderate to severe.  She denies fevers or chills.  She has been hypotensive since arrival and is currently receiving fluids.  Past Medical History:  Diagnosis Date   Allergic rhinitis    Anemia     after gastric bypass in 2018   Anxiety    Barrett's esophagus    Bipolar affective disorder (HCC)    CAP (community acquired pneumonia) 07/20/2018   Chronic respiratory failure (HCC)    Constipation, chronic    Degenerative joint disease of spine    Depression    Diabetes mellitus without complication (HCC)    type 2    DM (diabetes mellitus) (HCC)    hypoglycemic per pt - no longer takes DM meds due to weight loss from gastric bypass in 2018   Dry eye    Family history of adverse reaction to anesthesia    nausea and vomiting   Gastroparesis    GERD (gastroesophageal reflux disease)    H/O shoulder replacement 08/11/2020   left shoulder   History of bariatric surgery 03/2017   History of hiatal hernia    HLD (hyperlipidemia) 03/12/2013   Hyperlipidemia    Hypertension    No HTN meds since weight loss from Gastric Bypass in 2018   Intractable chronic migraine without aura 06/04/2015   Migraine headache    Morbid obesity (HCC)    OSA (obstructive sleep apnea)    No cpap since gastric surgery   Osteoarthritis    bilateral knee   SBO (small bowel obstruction) (HCC) 03/19/2019   Sleep apnea    Unspecified  hypothyroidism    Vitamin B 12 deficiency    Vitamin D  deficiency     Past Surgical History:  Procedure Laterality Date   ANKLE ARTHROSCOPY WITH RECONSTRUCTION Right 09/24/2019   Procedure: ANKLE ARTHROSCOPY DEBRIDEMENT TREATMENT OF OSTEOCHONDRAL LESION TALUS. PRONEAL TENDON DEBRIDEMENT;  Surgeon: Elsa Lonni JONELLE, MD;  Location: [facility];  Service: Orthopedics;  Laterality: Right;  SURGERY REQUEST TIME 2 HOURS   APPENDECTOMY  1978   CHOLECYSTECTOMY  2005   COLONOSCOPY     disc repair  05/17/2019   with rods, lumbar spine   GASTRIC ROUX-EN-Y N/A 03/21/2017   Procedure: LAPAROSCOPIC ROUX-EN-Y GASTRIC, UPPER ENDO;  Surgeon: Camellia Blush, MD;  Location: [facility];  Service: General;  Laterality: N/A;   GASTROSTOMY N/A 06/19/2018   Procedure: LAPRASCOPIC INSERTION OF GASTROSTOMY TUBE;  Surgeon: Blush Camellia, MD;  Location: [facility];  Service: General;  Laterality: N/A;   GASTROSTOMY TUBE PLACEMENT Left    06/2018   HIATAL HERNIA REPAIR N/A 06/19/2018   Procedure: LAPAROSCOPIC REPAIR OF HIATAL HERNIA;  Surgeon: Blush Camellia, MD;  Location: [facility];  Service: General;  Laterality: N/A;   LAPAROSCOPIC LYSIS OF ADHESIONS  03/19/2019   Dr. Mitzie Freund   LAPAROSCOPY N/A 06/19/2018   Procedure: LAPAROSCOPY DIAGNOSTIC;  Surgeon: Blush Camellia, MD;  Location: [facility];  Service: General;  Laterality: N/A;   LAPAROSCOPY N/A 03/19/2019   Procedure: LAPAROSCOPY DIAGNOSTIC lysis of adhesions;  Surgeon: Signe Mitzie LABOR, MD;  Location: [facility];  Service: General;  Laterality: N/A;   LUMBAR LAMINECTOMY/DECOMPRESSION MICRODISCECTOMY Right 10/11/2018   Procedure: LAMINECTOMY AND FORAMINOTOMY RIGHT LUMBAR FOUR- LUMBAR FIVE;  Surgeon: Lanis Pupa, MD;  Location: [facility];  Service: Neurosurgery;  Laterality: Right;   MASS EXCISION Right 09/23/2021   Procedure: EXCISION MASS VOLAR ASPECT RIGHT HAND;  Surgeon: Murrell Drivers, MD;  Location: [facility];  Service: Orthopedics;   Laterality: Right;  45 MIN   SHOULDER ARTHROSCOPY  06/2011   left-dsc   TONSILLECTOMY  at age 4   TOTAL SHOULDER ARTHROPLASTY Left 08/11/2020   Procedure: TOTAL SHOULDER ARTHROPLASTY;  Surgeon: Josefina Chew, MD;  Location: [facility];  Service: Orthopedics;  Laterality: Left;   TRIGGER FINGER RELEASE  12/20/2012   Procedure: RELEASE TRIGGER FINGER/A-1 PULLEY;  Surgeon: Drivers JONELLE Murrell, MD;  Location: [facility];  Service: Orthopedics;  Laterality: Left;  LEFT TRIGGER THUMB RELEASE   TRIGGER FINGER RELEASE Right 09/23/2021   Procedure: RELEASE A-1 PULLEY RIGHT INDEX FINGER;  Surgeon: Murrell Drivers, MD;  Location: [facility];  Service: Orthopedics;  Laterality: Right;    Family History  Problem Relation Age of Onset   Asthma Father    Allergies Father    Heart disease Father        enlarged heart   Peripheral vascular disease Father    Diabetes Father    Hyperlipidemia Father    Arthritis Father    Asthma Sister    Cancer Sister        colon at 57 yr old.   Colon cancer Sister    Allergies Mother    Stroke Mother 76       with hemi paralysis   Diabetes Mother    Hyperlipidemia Mother    Hypertension Mother    GI problems Mother    Arthritis Mother    Allergies Brother    Early death Brother 13       congenital abormality   Allergies Sister    Diabetes Sister    Asthma Sister    Colon polyps Sister    Hyperlipidemia Sister    GI problems Sister        gastroporesis    Liver disease Sister        fatty liver   Stroke Sister        intercrandial bleed   Diabetes Brother    Hypertension Brother    Hyperlipidemia Brother    Heart disease Paternal Aunt    Migraines Neg Hx    Social History:  reports that she has never smoked. She has been exposed to tobacco smoke. She has never used smokeless tobacco. She reports that she does not drink alcohol  and does not use drugs.  Allergies:  Allergies  Allergen Reactions    Ketoconazole Hives and Swelling    SWELLING REACTION UNSPECIFIED    Pravachol  [Pravastatin  Sodium] Shortness Of Breath, Swelling and Anaphylaxis    Throat swelling   Statins Other (See Comments)    Leg cramps   Tape Dermatitis and Other (See Comments)    Steri-Strips   Codeine Hypertension    increased BP    (Not in a hospital admission)   Results for orders placed or performed during the hospital encounter of 10/29/24 (from the past 48 hours)  CBC with Differential  Status: Abnormal   Collection Time: 10/29/24  3:41 PM  Result Value Ref Range   WBC 1.1 (LL) 4.0 - 10.5 K/uL    Comment: This critical result has been called to RONAL BLUSH, RN by Delon Hoit on 10/29/2024 16:09:46, and has been read back.   RBC 4.49 3.87 - 5.11 MIL/uL   Hemoglobin 13.0 12.0 - 15.0 g/dL   HCT 57.5 63.9 - 53.9 %   MCV 94.4 80.0 - 100.0 fL   MCH 29.0 26.0 - 34.0 pg   MCHC 30.7 30.0 - 36.0 g/dL   RDW 85.3 88.4 - 84.4 %   Platelets 362 150 - 400 K/uL   nRBC 0.0 0.0 - 0.2 %   Neutrophils Relative % 88 %   Neutro Abs 1.0 (L) 1.7 - 7.7 K/uL   Lymphocytes Relative 9 %   Lymphs Abs 0.1 (L) 0.7 - 4.0 K/uL   Monocytes Relative 2 %   Monocytes Absolute 0.0 (L) 0.1 - 1.0 K/uL   Eosinophils Relative 0 %   Eosinophils Absolute 0.0 0.0 - 0.5 K/uL   Basophils Relative 0 %   Basophils Absolute 0.0 0.0 - 0.1 K/uL   WBC Morphology See Note     Comment: Mild Left Shift (1-5% metas, occ myelo)   RBC Morphology MORPHOLOGY UNREMARKABLE    Smear Review Normal platelet morphology    Immature Granulocytes 1 %   Abs Immature Granulocytes 0.01 0.00 - 0.07 K/uL    Comment: Performed at [facility], [address] [place], [place] [address]  Comprehensive metabolic panel     Status: Abnormal   Collection Time: 10/29/24  3:41 PM  Result Value Ref Range   Sodium 142 135 - 145 mmol/L   Potassium 3.3 (L) 3.5 - 5.1 mmol/L   Chloride 108 98 - 111 mmol/L   CO2 21 (L) 22 - 32 mmol/L   Glucose, Bld  141 (H) 70 - 99 mg/dL    Comment: Glucose reference range applies only to samples taken after fasting for at least 8 hours.   BUN 25 (H) 6 - 20 mg/dL   Creatinine, Ser 8.87 (H) 0.44 - 1.00 mg/dL   Calcium  7.9 (L) 8.9 - 10.3 mg/dL   Total Protein 5.6 (L) 6.5 - 8.1 g/dL   Albumin  3.4 (L) 3.5 - 5.0 g/dL   AST 616 (H) 15 - 41 U/L   ALT 272 (H) 0 - 44 U/L   Alkaline Phosphatase 127 (H) 38 - 126 U/L   Total Bilirubin 0.3 0.0 - 1.2 mg/dL   GFR, Estimated 57 (L) >60 mL/min    Comment: (NOTE) Calculated using the CKD-EPI Creatinine Equation (2021)    Anion gap 14 5 - 15    Comment: Performed at [facility], [address], [place], [place] [address]  Lipase, blood     Status: Abnormal   Collection Time: 10/29/24  3:41 PM  Result Value Ref Range   Lipase >2,800 (H) 11 - 51 U/L    Comment: Performed at [facility], [address] [place], [place] [address]  Magnesium      Status: None   Collection Time: 10/29/24  3:41 PM  Result Value Ref Range   Magnesium  1.7 1.7 - 2.4 mg/dL    Comment: Performed at [facility], [address], [place], [place] [address]  Protime-INR     Status: None   Collection Time: 10/29/24  5:10 PM  Result Value Ref Range  Prothrombin Time 13.3 11.4 - 15.2 seconds   INR 1.0 0.8 - 1.2    Comment: (NOTE) INR goal varies based on device and disease states. Performed at [facility], [address] [place], [place] [address]   I-Stat Lactic Acid, ED     Status: Abnormal   Collection Time: 10/29/24  5:14 PM  Result Value Ref Range   Lactic Acid, Venous 5.1 (HH) 0.5 - 1.9 mmol/L   Comment NOTIFIED PHYSICIAN   Urinalysis, w/ Reflex to Culture (Infection Suspected) -Urine, Clean Catch     Status: Abnormal   Collection Time: 10/29/24  5:23 PM  Result Value Ref Range   Specimen Source URINE, CATHETERIZED    Color, Urine AMBER (A) YELLOW    Comment: BIOCHEMICALS MAY BE AFFECTED BY COLOR    APPearance HAZY (A) CLEAR   Specific Gravity, Urine 1.024 1.005 - 1.030   pH 5.0 5.0 - 8.0   Glucose, UA NEGATIVE NEGATIVE mg/dL   Hgb urine dipstick NEGATIVE NEGATIVE   Bilirubin Urine NEGATIVE NEGATIVE   Ketones, ur NEGATIVE NEGATIVE mg/dL   Protein, ur 30 (A) NEGATIVE mg/dL   Nitrite NEGATIVE NEGATIVE   Leukocytes,Ua NEGATIVE NEGATIVE   RBC / HPF 0-5 0 - 5 RBC/hpf   WBC, UA 0-5 0 - 5 WBC/hpf    Comment:        Reflex urine culture not performed if WBC <=10, OR if Squamous epithelial cells >5. If Squamous epithelial cells >5 suggest recollection.    Bacteria, UA RARE (A) NONE SEEN   Squamous Epithelial / HPF 0-5 0 - 5 /HPF   Mucus PRESENT    Hyaline Casts, UA PRESENT     Comment: Performed at [facility], [address], [place], [place] [address]  Culture, blood (Routine x 2)     Status: None (Preliminary result)   Collection Time: 10/29/24  5:36 PM   Specimen: BLOOD LEFT WRIST  Result Value Ref Range   Specimen Description      BLOOD LEFT WRIST Performed at [facility], [address], [place], [place] [address]    Special Requests      BOTTLES DRAWN AEROBIC ONLY Blood Culture results may not be optimal due to an inadequate volume of blood received in culture bottles Performed at [facility], [address] [place], [place] [address]    Culture PENDING    Report Status PENDING   Culture, blood (Routine x 2)     Status: None (Preliminary result)   Collection Time: 10/29/24  5:37 PM   Specimen: BLOOD RIGHT HAND  Result Value Ref Range   Specimen Description      BLOOD RIGHT HAND Performed at [facility], [address] [place], [place] [address]    Special Requests      BOTTLES DRAWN AEROBIC ONLY Blood Culture results may not be optimal due to an inadequate volume of blood received in culture bottles Performed at [facility], [address] [place], [place] [address]    Culture PENDING     Report Status PENDING    CT ABDOMEN PELVIS W CONTRAST Result Date: 10/29/2024 CLINICAL DATA:  Mid abdominal pain, nausea, and vomiting EXAM: CT ABDOMEN AND PELVIS WITH CONTRAST TECHNIQUE: Multidetector CT imaging of the abdomen and pelvis was performed using the standard protocol following bolus administration of intravenous contrast. RADIATION DOSE REDUCTION: This exam was performed according to the departmental dose-optimization program which includes automated exposure  control, adjustment of the mA and/or kV according to patient size and/or use of iterative reconstruction technique. CONTRAST:  OMNIPAQUE  IOHEXOL  300 MG/ML  SOLN COMPARISON:  CT abdomen and pelvis dated 03/19/2019 FINDINGS: Lower chest: Bilateral lower lobe subsegmental atelectasis. Trace bilateral pleural effusions. Partially imaged heart size is normal. Hepatobiliary: Atrophic left hepatic lobe. No intra or extrahepatic biliary ductal dilation. Cholecystectomy. Pancreas: No focal lesions or main ductal dilation. Spleen: 9 mm hypodensity in the spleen (2:29), likely benign cyst. Adrenals/Urinary Tract: No adrenal nodules. No suspicious renal mass, calculi or hydronephrosis. 10 mm minimally complicated cyst in the anterior interpolar left kidney (11:12). No focal bladder wall thickening. Stomach/Bowel: Postsurgical changes of Roux-en-Y gastric bypass. Marked dilation of the excluded stomach and duodenum to the level of the duodenojejunal junction, where there is abrupt luminal caliber transition (9:118). There is also apparent tethering of the gastric body at this level (7:50) as well as marked luminal narrowing of the transverse colon (2:42). Mural thickening and enhancement of the additional small bowel loops throughout the abdomen, many of which are underdistended, predominantly in the left lower quadrant. Distal transverse, descending, and rectosigmoid colon are underdistended. Appendix is not discretely seen. Vascular/Lymphatic:  Aortic atherosclerosis. No enlarged abdominal or pelvic lymph nodes. Reproductive: Subtly delineated right adnexal cystic structure measures 3.4 cm (2:74). No left adnexal mass. Other: Moderate volume ascites.  No free air. Musculoskeletal: No acute or abnormal lytic or blastic osseous lesions. Postsurgical changes of L3-5 spinal fusion. Hardware appears intact. Multilevel degenerative changes of the partially imaged thoracic and lumbar spine. IMPRESSION: 1. Marked dilation of the excluded stomach and duodenum to the level of the duodenojejunal junction, where there is abrupt luminal caliber transition associated with apparent tethering of the gastric body as well as marked luminal narrowing of the transverse colon. Findings are suspicious for obstruction of the excluded stomach and duodenum, likely secondary to adhesions. Of note, an adhesion was demonstrated causing obstruction in this area when the patient last presented with similar CT appearance on 03/19/2019. Recommend surgical consultation. 2. Mural thickening and enhancement of the additional small bowel loops throughout the abdomen, predominantly in the left lower quadrant, which may be reactive or reflect enteritis. 3. Moderate volume ascites. 4. Subtly delineated right adnexal cystic structure measures 3.4 cm. Recommend nonemergent pelvic ultrasound for further evaluation. 5. Trace bilateral pleural effusions. 6.  Aortic Atherosclerosis (ICD10-I70.0). Electronically Signed   By: Limin  Xu M.D.   On: 10/29/2024 19:17   DG Abdomen Acute W/Chest Result Date: 10/29/2024 EXAM: UPRIGHT AND SUPINE XRAY VIEWS OF THE ABDOMEN AND 4 VIEW(S) OF THE CHEST 10/29/2024 04:31:00 PM COMPARISON: 07/21/2018 CLINICAL HISTORY: vomiting with epigastric abdominal pain FINDINGS: LUNGS AND PLEURA: No consolidation or pulmonary edema. No pleural effusion or pneumothorax. HEART AND MEDIASTINUM: No acute abnormality of the cardiac and mediastinal silhouettes. BOWEL: The bowel gas  pattern is nonspecific. No bowel obstruction. PERITONEUM AND SOFT TISSUES: Status post cholecystectomy. Possible free air is noted along the left abdominal wall versus air-filled colon. CT scan is recommended for further evaluation. No abnormal calcifications. BONES: No acute osseous abnormality. IMPRESSION: 1. Possible free intraperitoneal air along the left abdominal wall versus air within the colon. 2. CT abdomen and pelvis is recommended for further evaluation. Electronically signed by: Lynwood Seip MD 10/29/2024 04:46 PM EST RP Workstation: [id number]    Review of Systems  All other systems reviewed and are negative.   Blood pressure (!) 80/56, pulse (!) 102, temperature 97.8 F (36.6 C), temperature source Oral,  resp. rate 19, height 5' 4 (1.626 m), weight 59.9 kg, last menstrual period 10/17/2017, SpO2 97%. Physical Exam Constitutional:      General: She is in acute distress.     Appearance: She is ill-appearing.  HENT:     Head: Normocephalic and atraumatic.  Eyes:     General: No scleral icterus. Cardiovascular:     Rate and Rhythm: Regular rhythm. Tachycardia present.  Pulmonary:     Effort: Pulmonary effort is normal.     Breath sounds: Normal breath sounds.  Abdominal:     Comments: Abdomen is distended and diffusely tender with guarding throughout  Skin:    General: Skin is warm and dry.  Neurological:     General: No focal deficit present.  Psychiatric:        Mood and Affect: Mood is anxious.        Behavior: Behavior normal.      Assessment/Plan Small bowel obstruction with an acute abdomen  I have examined the patient and reviewed the CT scan of the abdomen pelvis.  I discussed the findings with the patient.  This appears to be high-grade obstruction at potentially the duodenal jejunal junction.  Given her exam, the CT findings, and her laboratory abnormalities with lactic acid of 5.1, and emergent exploratory laparotomy is recommended.  I discussed the  reasonings for this with the patient.  I have discussed this with her niece by phone.  I discussed the risk of surgery which includes but is not limited to bleeding, infection, the need for bowel resection, injury to surrounding structures, the potential need for feeding tube or gastrostomy tube, prolonged intubation, the need for leaving the abdomen open, cardiopulmonary issues, DVT, and even death.  She understands and agrees to proceed with surgery which again is scheduled emergently  Complex medical decision making  [name], MD 10/29/2024, 8:16 PM

## 2024-10-29 NOTE — ED Notes (Signed)
 RN is aware of pt's BP reading. RN at bedside.

## 2024-10-29 NOTE — ED Notes (Signed)
Blood cultures collected before abx started

## 2024-10-29 NOTE — Anesthesia Procedure Notes (Signed)
 Procedure Name: Intubation Date/Time: 10/29/2024 9:20 PM  Performed by: Pheobe Adine CROME, CRNAPre-anesthesia Checklist: Patient identified, Emergency Drugs available, Suction available, Patient being monitored and Timeout performed Patient Re-evaluated:Patient Re-evaluated prior to induction Oxygen  Delivery Method: Circle system utilized Preoxygenation: Pre-oxygenation with 100% oxygen  Induction Type: IV induction Laryngoscope Size: Miller and 2 Grade View: Grade II Tube type: Oral Tube size: 7.0 mm Number of attempts: 1 Airway Equipment and Method: Stylet Placement Confirmation: ETT inserted through vocal cords under direct vision, positive ETCO2, CO2 detector and breath sounds checked- equal and bilateral Secured at: 21 cm Tube secured with: Tape Dental Injury: Teeth and Oropharynx as per pre-operative assessment

## 2024-10-29 NOTE — ED Triage Notes (Addendum)
 BIB EMS from home for mid abdominal pain with N/V. Hx of gastric bypass., Had CT scan yesterday at another hospital, CT scan was normal and was sent home.  EMS administered 900mL NS and 10mg  of Reglan 

## 2024-10-29 NOTE — Anesthesia Preprocedure Evaluation (Signed)
 Anesthesia Evaluation  Patient identified by MRN, date of birth, ID band Patient awake    Reviewed: Unable to perform ROS - Chart review onlyPreop documentation limited or incomplete due to emergent nature of procedure.  History of Anesthesia Complications Negative for: history of anesthetic complications  Airway Mallampati: III  TM Distance: >3 FB Neck ROM: Full  Mouth opening: Limited Mouth Opening  Dental  (+) Teeth Intact, Dental Advisory Given   Pulmonary sleep apnea and Continuous Positive Airway Pressure Ventilation     + decreased breath sounds      Cardiovascular hypertension,  Rhythm:Regular Rate:Tachycardia  Left ventricle: The cavity size was normal. Wall thickness was    normal. Systolic function was normal. The estimated ejection    fraction was in the range of 55% to 60%. Doppler parameters are    consistent with abnormal left ventricular relaxation (grade 1    diastolic dysfunction).     Neuro/Psych  Headaches PSYCHIATRIC DISORDERS       Neuromuscular disease    GI/Hepatic Neg liver ROS, hiatal hernia,GERD  Medicated,,bowel obstruction   Endo/Other  diabetesHypothyroidism    Renal/GU      Musculoskeletal  (+) Arthritis ,    Abdominal   Peds  Hematology Lab Results      Component                Value               Date                      WBC                      1.1 (LL)            10/29/2024                HGB                      13.0                10/29/2024                HCT                      42.4                10/29/2024                MCV                      94.4                10/29/2024                PLT                      362                 10/29/2024              Anesthesia Other Findings   Reproductive/Obstetrics                              Anesthesia Physical Anesthesia Plan  ASA: 4 and emergent  Anesthesia Plan: General   Post-op Pain  Management:    Induction: Intravenous, Rapid sequence  and Cricoid pressure planned  PONV Risk Score and Plan: 4 or greater and Ondansetron   Airway Management Planned: Oral ETT  Additional Equipment: Arterial line, CVP and Ultrasound Guidance Line Placement  Intra-op Plan:   Post-operative Plan: Post-operative intubation/ventilation  Informed Consent: I have reviewed the patients History and Physical, chart, labs and discussed the procedure including the risks, benefits and alternatives for the proposed anesthesia with the patient or authorized representative who has indicated his/her understanding and acceptance.     Only emergency history available  Plan Discussed with: CRNA and Surgeon  Anesthesia Plan Comments:          Anesthesia Quick Evaluation

## 2024-10-29 NOTE — Transfer of Care (Signed)
 Immediate Anesthesia Transfer of Care Note  Patient: Morgan [name]  Procedure(s) Performed: LAPAROTOMY, EXPLORATORY, PLACEMENT OF G-TUBE, WOUND VAC  Patient Location: ICU  Anesthesia Type:General  Level of Consciousness: sedated and Patient remains intubated per anesthesia plan  Airway & Oxygen  Therapy: Patient remains intubated per anesthesia plan  Post-op Assessment: Post -op Vital signs reviewed and stable  Post vital signs: Reviewed and stable  Last Vitals:  Vitals Value Taken Time  BP 102/68   Temp 98.9   Pulse 88   Resp 20   SpO2 98     Last Pain:  Vitals:   10/29/24 2023  TempSrc:   PainSc: 10-Worst pain ever         Complications: No notable events documented.

## 2024-10-29 NOTE — Anesthesia Procedure Notes (Signed)
 Central Venous Catheter Insertion Performed by: Leopoldo Bruckner, MD, anesthesiologist Start/End11/18/2025 9:02 PM, 10/29/2024 9:10 PM Patient location: Pre-op. Emergency situation Preanesthetic checklist: patient identified, IV checked, site marked, risks and benefits discussed, surgical consent, monitors and equipment checked, pre-op evaluation, timeout performed and anesthesia consent Position: supine Patient sedated Hand hygiene performed  and maximum sterile barriers used  Catheter size: 8 Fr Total catheter length 16. Central line was placed.Double lumen Procedure performed using ultrasound to evaluate access site. Ultrasound Notes:relevant anatomy identified, ultrasound used to visualize needle entry, vessel patent under ultrasound and image(s) printed for medical record. Attempts: 1 Following insertion, dressing applied, line sutured and Biopatch. Post procedure assessment: blood return through all ports, free fluid flow and no air  Patient tolerated the procedure well with no immediate complications.

## 2024-10-29 NOTE — ED Provider Notes (Cosign Needed Addendum)
 [place] EMERGENCY DEPARTMENT AT [facility] Provider Note   CSN: [id number] Arrival date & time: 10/29/24  1422     Patient presents with: Abdominal Pain   Morgan [name] is a 58 y.o. female patient with history of small bowel obstruction, Roux-en-Y gastric bypass, diabetes who presents to the emergency department today for further evaluation of intractable nausea and vomiting.  Nausea and vomiting started yesterday.  She is also endorsing some epigastric abdominal pain.  Patient had a CT scan at an outside facility which was normal.  No evidence of bowel obstruction at that time.  She states pain has gotten worse.  Patient had a bowel movement yesterday but none today.  Patient unable to tolerate any p.o. fluids or solids.  She states this feels similar to when she had a small bowel obstruction in the past.  She also notes decreased urine output.  No vaginal symptoms.  No fever or chills.    Abdominal Pain      Prior to Admission medications   Medication Sig Start Date End Date Taking? Authorizing Provider  Blood Pressure Monitoring KIT Check blood pressure daily 10/03/24   Swinyer, Rosaline HERO, NP  Calcium -Vitamin D -Vitamin K  650-12.5-40 MG-MCG-MCG CHEW Chew 1 each by mouth 2 (two) times daily.    [provider]  cyclobenzaprine  (FLEXERIL ) 10 MG tablet Take 1 tablet (10 mg total) by mouth 3 (three) times daily as needed for muscle spasms. 06/28/24   [name], DO  desloratadine  (CLARINEX ) 5 MG tablet TAKE ONE TABLET DAILY 05/13/24   Jolinda Norene M, DO  dicyclomine  (BENTYL ) 20 MG tablet TAKE 1 TABLET EVERY 8 HOURS AS NEEDED FOR ABDOMINAL CRAMPING 12/29/23   Jolinda Norene M, DO  Erenumab -aooe 140 MG/ML SOAJ Inject 140 mg into the skin every 30 (thirty) days. 06/24/24   Lomax, Amy, NP  Evolocumab (REPATHA SURECLICK) 140 MG/ML SOAJ Inject 140 mg into the skin every 14 (fourteen) days. 09/23/24   Swinyer, Rosaline HERO, NP  ezetimibe  (ZETIA ) 10 MG tablet  TAKE ONE TABLET DAILY 08/13/24   Jolinda Norene M, DO  gabapentin  (NEURONTIN ) 300 MG capsule Takes 2 capsules in morning, one cap at lunch, 2 caps at bedtime 06/03/24   Lomax, Amy, NP  Glucagon , rDNA, (GLUCAGON  EMERGENCY IJ) Inject 1 Syringe as directed daily as needed (emergency low blood sugar).     [provider]  glucose blood (ONETOUCH ULTRA) test strip Check BS four times a day Dx E11.9 09/11/23   Jolinda Norene M, DO  levothyroxine  (SYNTHROID ) 112 MCG tablet TAKE ONE TABLET ONCE DAILY BEFORE BREAKFAST 07/02/24   Jolinda Norene M, DO  linaclotide  (LINZESS ) 290 MCG CAPS capsule Take 1 capsule (290 mcg total) by mouth daily before breakfast. 07/02/24   Jolinda Norene M, DO  loratadine  (CLARITIN ) 10 MG tablet Take 10 mg by mouth daily as needed for allergies.    [provider]  metoprolol tartrate (LOPRESSOR) 50 MG tablet Take 1 tablet (50 mg total) by mouth once for 1 dose. Take 90-120 minutes prior to scan. Hold for SBP less than 110. 09/23/24 09/23/24  Swinyer, Rosaline HERO, NP  mometasone  (NASONEX ) 50 MCG/ACT nasal spray USE 2 SPRAYS IN EACH NOSTRIL ONCE DAILY (REPLACES FLONASE ) 07/30/24   [name], DO  Multiple Vitamins-Minerals (BARIATRIC MULTIVITAMINS/IRON  PO) Take 1 tablet by mouth daily.    [provider]  MYRBETRIQ  25 MG TB24 tablet TAKE ONE TABLET DAILY 08/13/24   Jolinda Norene M, DO  nystatin  (MYCOSTATIN /NYSTOP ) powder  APPLY TO AFFECTED AREA OF GROIN RASH TWICE A DAY FOR 7 TO 10 DAYS PER FLARE 11/29/23   Jolinda Potter M, DO  pantoprazole  (PROTONIX ) 40 MG tablet TAKE ONE TABLET DAILY 07/22/24   Jolinda Potter M, DO  polyethylene glycol (MIRALAX  / GLYCOLAX ) packet Take 17 g by mouth at bedtime.    [provider]  Probiotic Product (PROBIOTIC DAILY PO) Take 1 capsule by mouth daily.    [provider]  sertraline  (ZOLOFT ) 100 MG tablet TAKE TWO TABLETS DAILY AS DIRECTED 07/02/24   Jolinda Potter M, DO  Simethicone   (GAS-X EXTRA STRENGTH) 125 MG CAPS Take 125 mg by mouth in the morning, at noon, and at bedtime.    [provider]  SUMAtriptan  (IMITREX ) 100 MG tablet Take 1 tablet (100 mg total) by mouth once as needed for up to 1 dose for migraine. May repeat in 2 hours if headache persists or recurs. 06/03/24   Lomax, Amy, NP  ziprasidone  (GEODON ) 60 MG capsule TAKE ONE CAPSULE AT BEDTIME 08/13/24   Jolinda Potter M, DO    Allergies: Ketoconazole, Pravachol  [pravastatin  sodium], Statins, Tape, and Codeine    Review of Systems  Gastrointestinal:  Positive for abdominal pain.  All other systems reviewed and are negative.   Updated Vital Signs BP 92/68   Pulse (!) 111   Temp 97.9 F (36.6 C)   Resp (!) 31   Ht 5' 4 (1.626 m)   Wt 59.9 kg   LMP 10/17/2017 (Approximate)   SpO2 94%   BMI 22.66 kg/m   Physical Exam Vitals and nursing note reviewed.  Constitutional:      General: She is in acute distress.     Appearance: Normal appearance.  HENT:     Head: Normocephalic and atraumatic.  Eyes:     General:        Right eye: No discharge.        Left eye: No discharge.  Cardiovascular:     Comments: Regular rate and rhythm.  S1/S2 are distinct without any evidence of murmur, rubs, or gallops.  Radial pulses are 2+ bilaterally.  Dorsalis pedis pulses are 2+ bilaterally.  No evidence of pedal edema. Pulmonary:     Comments: Clear to auscultation bilaterally.  Normal effort.  No respiratory distress.  No evidence of wheezes, rales, or rhonchi heard throughout. Abdominal:     General: Abdomen is flat. Bowel sounds are decreased. There is no distension.     Tenderness: There is generalized abdominal tenderness. There is no guarding or rebound.  Musculoskeletal:        General: Normal range of motion.     Cervical back: Neck supple.  Skin:    General: Skin is warm and dry.     Findings: No rash.  Neurological:     General: No focal deficit present.     Mental Status: She is alert.   Psychiatric:        Mood and Affect: Mood normal.        Behavior: Behavior normal.     (all labs ordered are listed, but only abnormal results are displayed) Labs Reviewed  CBC WITH DIFFERENTIAL/PLATELET - Abnormal; Notable for the following components:      Result Value   WBC 1.1 (*)    Neutro Abs 1.0 (*)    Lymphs Abs 0.1 (*)    Monocytes Absolute 0.0 (*)    All other components within normal limits  COMPREHENSIVE METABOLIC PANEL WITH GFR - Abnormal;  Notable for the following components:   Potassium 3.3 (*)    CO2 21 (*)    Glucose, Bld 141 (*)    BUN 25 (*)    Creatinine, Ser 1.12 (*)    Calcium  7.9 (*)    Total Protein 5.6 (*)    Albumin  3.4 (*)    AST 383 (*)    ALT 272 (*)    Alkaline Phosphatase 127 (*)    GFR, Estimated 57 (*)    All other components within normal limits  LIPASE, BLOOD - Abnormal; Notable for the following components:   Lipase >2,800 (*)    All other components within normal limits  URINALYSIS, W/ REFLEX TO CULTURE (INFECTION SUSPECTED) - Abnormal; Notable for the following components:   Color, Urine AMBER (*)    APPearance HAZY (*)    Protein, ur 30 (*)    Bacteria, UA RARE (*)    All other components within normal limits  I-STAT CG4 LACTIC ACID, ED - Abnormal; Notable for the following components:   Lactic Acid, Venous 5.1 (*)    All other components within normal limits  CULTURE, BLOOD (ROUTINE X 2)  CULTURE, BLOOD (ROUTINE X 2)  MAGNESIUM   PROTIME-INR  CBC  BASIC METABOLIC PANEL WITH GFR  I-STAT CG4 LACTIC ACID, ED    EKG: None  Radiology: CT ABDOMEN PELVIS W CONTRAST Result Date: 10/29/2024 CLINICAL DATA:  Mid abdominal pain, nausea, and vomiting EXAM: CT ABDOMEN AND PELVIS WITH CONTRAST TECHNIQUE: Multidetector CT imaging of the abdomen and pelvis was performed using the standard protocol following bolus administration of intravenous contrast. RADIATION DOSE REDUCTION: This exam was performed according to the departmental  dose-optimization program which includes automated exposure control, adjustment of the mA and/or kV according to patient size and/or use of iterative reconstruction technique. CONTRAST:  OMNIPAQUE  IOHEXOL  300 MG/ML  SOLN COMPARISON:  CT abdomen and pelvis dated 03/19/2019 FINDINGS: Lower chest: Bilateral lower lobe subsegmental atelectasis. Trace bilateral pleural effusions. Partially imaged heart size is normal. Hepatobiliary: Atrophic left hepatic lobe. No intra or extrahepatic biliary ductal dilation. Cholecystectomy. Pancreas: No focal lesions or main ductal dilation. Spleen: 9 mm hypodensity in the spleen (2:29), likely benign cyst. Adrenals/Urinary Tract: No adrenal nodules. No suspicious renal mass, calculi or hydronephrosis. 10 mm minimally complicated cyst in the anterior interpolar left kidney (11:12). No focal bladder wall thickening. Stomach/Bowel: Postsurgical changes of Roux-en-Y gastric bypass. Marked dilation of the excluded stomach and duodenum to the level of the duodenojejunal junction, where there is abrupt luminal caliber transition (9:118). There is also apparent tethering of the gastric body at this level (7:50) as well as marked luminal narrowing of the transverse colon (2:42). Mural thickening and enhancement of the additional small bowel loops throughout the abdomen, many of which are underdistended, predominantly in the left lower quadrant. Distal transverse, descending, and rectosigmoid colon are underdistended. Appendix is not discretely seen. Vascular/Lymphatic: Aortic atherosclerosis. No enlarged abdominal or pelvic lymph nodes. Reproductive: Subtly delineated right adnexal cystic structure measures 3.4 cm (2:74). No left adnexal mass. Other: Moderate volume ascites.  No free air. Musculoskeletal: No acute or abnormal lytic or blastic osseous lesions. Postsurgical changes of L3-5 spinal fusion. Hardware appears intact. Multilevel degenerative changes of the partially imaged  thoracic and lumbar spine. IMPRESSION: 1. Marked dilation of the excluded stomach and duodenum to the level of the duodenojejunal junction, where there is abrupt luminal caliber transition associated with apparent tethering of the gastric body as well as marked luminal narrowing of  the transverse colon. Findings are suspicious for obstruction of the excluded stomach and duodenum, likely secondary to adhesions. Of note, an adhesion was demonstrated causing obstruction in this area when the patient last presented with similar CT appearance on 03/19/2019. Recommend surgical consultation. 2. Mural thickening and enhancement of the additional small bowel loops throughout the abdomen, predominantly in the left lower quadrant, which may be reactive or reflect enteritis. 3. Moderate volume ascites. 4. Subtly delineated right adnexal cystic structure measures 3.4 cm. Recommend nonemergent pelvic ultrasound for further evaluation. 5. Trace bilateral pleural effusions. 6.  Aortic Atherosclerosis (ICD10-I70.0). Electronically Signed   By: Limin  Xu M.D.   On: 10/29/2024 19:17   DG Abdomen Acute W/Chest Result Date: 10/29/2024 EXAM: UPRIGHT AND SUPINE XRAY VIEWS OF THE ABDOMEN AND 4 VIEW(S) OF THE CHEST 10/29/2024 04:31:00 PM COMPARISON: 07/21/2018 CLINICAL HISTORY: vomiting with epigastric abdominal pain FINDINGS: LUNGS AND PLEURA: No consolidation or pulmonary edema. No pleural effusion or pneumothorax. HEART AND MEDIASTINUM: No acute abnormality of the cardiac and mediastinal silhouettes. BOWEL: The bowel gas pattern is nonspecific. No bowel obstruction. PERITONEUM AND SOFT TISSUES: Status post cholecystectomy. Possible free air is noted along the left abdominal wall versus air-filled colon. CT scan is recommended for further evaluation. No abnormal calcifications. BONES: No acute osseous abnormality. IMPRESSION: 1. Possible free intraperitoneal air along the left abdominal wall versus air within the colon. 2. CT abdomen  and pelvis is recommended for further evaluation. Electronically signed by: Lynwood Seip MD 10/29/2024 04:46 PM EST RP Workstation: [id number]     .Critical Care  Performed by: Theotis Cameron HERO, PA-C Authorized by: Theotis Cameron HERO, PA-C   Critical care provider statement:    Critical care time (minutes):  35   Critical care time was exclusive of:  Separately billable procedures and treating other patients   Critical care was necessary to treat or prevent imminent or life-threatening deterioration of the following conditions:  Sepsis   Critical care was time spent personally by me on the following activities:  Blood draw for specimens, development of treatment plan with patient or surrogate, discussions with consultants, ordering and performing treatments and interventions, ordering and review of laboratory studies, ordering and review of radiographic studies, pulse oximetry, re-evaluation of patient's condition and review of old charts    Medications Ordered in the ED  lactated ringers  infusion ( Intravenous Infusion Verify 10/29/24 2346)  norepinephrine  (LEVOPHED ) 4mg  in (0.016 mg/mL) premix infusion (35 mcg/min Intravenous New Bag/Given 10/29/24 2348)  HYDROmorphone  (DILAUDID ) injection 1 mg ( Intravenous MAR Unhold 10/29/24 2353)  vasopressin (PITRESSIN) 20 Units in 100 mL (0.2 unit/mL) infusion-*FOR SHOCK* (0.03 Units/min Intravenous Infusion Verify 10/29/24 2346)  enoxaparin  (LOVENOX ) injection 40 mg (has no administration in time range)  0.9 % NaCl with KCl 20 mEq/ L  infusion (has no administration in time range)  pantoprazole  (PROTONIX ) injection 40 mg (has no administration in time range)  piperacillin -tazobactam (ZOSYN ) IVPB 3.375 g (has no administration in time range)  sodium chloride  0.9 % bolus 1,000 mL (0 mLs Intravenous Stopped 10/29/24 1643)  fentaNYL  (SUBLIMAZE ) injection 50 mcg (50 mcg Intravenous Given 10/29/24 1524)  sodium chloride  0.9 % bolus 1,000 mL (1,000  mLs Intravenous Transfusing/Transfer 10/29/24 2029)  metroNIDAZOLE (FLAGYL) IVPB 500 mg (0 mg Intravenous Stopped 10/29/24 1809)  cefTRIAXone  (ROCEPHIN ) 2 g in sodium chloride  0.9 % 100 mL IVPB (0 g Intravenous Stopped 10/29/24 1733)  lactated ringers  bolus 1,000 mL (1,000 mLs Intravenous Transfusing/Transfer 10/29/24 2029)    And  lactated  ringers  bolus 1,000 mL (1,000 mLs Intravenous Transfusing/Transfer 10/29/24 2029)  fentaNYL  (SUBLIMAZE ) injection 50 mcg (50 mcg Intravenous Given 10/29/24 1735)  iohexol  (OMNIPAQUE ) 300 MG/ML solution 100 mL (100 mLs Intravenous Contrast Given 10/29/24 1806)  fentaNYL  (SUBLIMAZE ) injection 50 mcg (50 mcg Intravenous Given 10/29/24 1858)  chlorhexidine  (PERIDEX ) 0.12 % solution 15 mL (15 mLs Mouth/Throat Given 10/29/24 2123)  piperacillin -tazobactam (ZOSYN ) IVPB 3.375 g (3.375 g Intravenous Given 10/29/24 2202)    Clinical Course as of 10/29/24 2354  Tue Oct 29, 2024  1632 CBC with Differential(!!) Significant leukopenia. [CF]  1719 Lipase, blood(!) Greater than 2,800.  [CF]  1720 I-Stat Lactic Acid, ED(!!) Initial lactic is very high.  [CF]  1720 Comprehensive metabolic panel(!) New transaminitis.  Elevated glucose.  Elevated creatinine.  Hypokalemia. [CF]  1946 I spoke with Dr. Vernetta with general surgery who will come evaluate the patient at the bedside. He recommends, fluids, NPO, and antibiotics.   [CF]    Clinical Course User Index [CF] Theotis Cameron HERO, PA-C    Medical Decision Making EMMA-LEE ODDO is a 58 y.o. female patient who presents to the emergency department today for further evaluation of epigastric abdominal pain and intractable nausea and vomiting.  Certainly concern for repeat small bowel obstruction given the patient's Roux-en-Y gastric bypass.  She states that she has had adhesions in the past.  Given the normal CT scan yesterday I will start with a acute abdominal series and get some lab work and additional some fluids as  the patient is hypotensive.  Patient continues to be hypotensive despite aggressive fluid resuscitation.  Given her leukopenia I broaden the workup to cover for possible sepsis.  She was started on broad-spectrum antibiotics to cover for intra-abdominal infection as this is likely the source.  She was given aggressive fluid resuscitation after 2 L of fluid following sepsis guidelines.  Pain was controlled with fentanyl .  CT scan did show bowel obstruction.  After reviewing the scan myself I am concerned for possible perforation.  After speaking with Dr. Vernetta with general surgery I started the patient on pressors to help improve her blood pressure.  Patient will be taken to the OR emergently.  Amount and/or Complexity of Data Reviewed Labs: ordered. Decision-making details documented in ED Course. Radiology: ordered.  Risk Prescription drug management.    Final diagnoses:  Intestinal adhesions with obstruction, unspecified whether partial or complete [facility])    ED Discharge Orders     None          Theotis Cameron HERO, [place] 10/29/24 2354    Theotis Cameron HERO, PA-C 10/29/24 2354    Gennaro Duwaine CROME, DO 10/31/24 1713

## 2024-10-29 NOTE — ED Notes (Signed)
 Surgery came and got patient for emergency surgery

## 2024-10-29 NOTE — ED Notes (Signed)
 [phone] Harlene, Pt nieces wishes to be contacted by pt doctor with an update after sx consult

## 2024-10-30 ENCOUNTER — Inpatient Hospital Stay (HOSPITAL_COMMUNITY)

## 2024-10-30 ENCOUNTER — Encounter (HOSPITAL_COMMUNITY): Payer: Self-pay | Admitting: Surgery

## 2024-10-30 DIAGNOSIS — K859 Acute pancreatitis without necrosis or infection, unspecified: Secondary | ICD-10-CM

## 2024-10-30 DIAGNOSIS — N179 Acute kidney failure, unspecified: Secondary | ICD-10-CM

## 2024-10-30 DIAGNOSIS — E1165 Type 2 diabetes mellitus with hyperglycemia: Secondary | ICD-10-CM

## 2024-10-30 DIAGNOSIS — R7401 Elevation of levels of liver transaminase levels: Secondary | ICD-10-CM | POA: Diagnosis not present

## 2024-10-30 DIAGNOSIS — K56609 Unspecified intestinal obstruction, unspecified as to partial versus complete obstruction: Secondary | ICD-10-CM | POA: Diagnosis not present

## 2024-10-30 DIAGNOSIS — E8721 Acute metabolic acidosis: Secondary | ICD-10-CM

## 2024-10-30 DIAGNOSIS — R6521 Severe sepsis with septic shock: Secondary | ICD-10-CM

## 2024-10-30 DIAGNOSIS — D62 Acute posthemorrhagic anemia: Secondary | ICD-10-CM | POA: Diagnosis not present

## 2024-10-30 DIAGNOSIS — A419 Sepsis, unspecified organism: Secondary | ICD-10-CM

## 2024-10-30 DIAGNOSIS — E440 Moderate protein-calorie malnutrition: Secondary | ICD-10-CM | POA: Insufficient documentation

## 2024-10-30 DIAGNOSIS — J9601 Acute respiratory failure with hypoxia: Secondary | ICD-10-CM

## 2024-10-30 LAB — BLOOD CULTURE ID PANEL (REFLEXED) - BCID2

## 2024-10-30 LAB — GLUCOSE, CAPILLARY
Glucose-Capillary: 146 mg/dL — ABNORMAL HIGH (ref 70–99)
Glucose-Capillary: 191 mg/dL — ABNORMAL HIGH (ref 70–99)
Glucose-Capillary: 240 mg/dL — ABNORMAL HIGH (ref 70–99)
Glucose-Capillary: 261 mg/dL — ABNORMAL HIGH (ref 70–99)
Glucose-Capillary: 286 mg/dL — ABNORMAL HIGH (ref 70–99)
Glucose-Capillary: 231 mg/dL — ABNORMAL HIGH (ref 70–99)
Glucose-Capillary: 194 mg/dL — ABNORMAL HIGH (ref 70–99)
Glucose-Capillary: 156 mg/dL — ABNORMAL HIGH (ref 70–99)

## 2024-10-30 LAB — BLOOD GAS, ARTERIAL
Acid-base deficit: 6.7 mmol/L — ABNORMAL HIGH (ref 0.0–2.0)
Acid-base deficit: 8.6 mmol/L — ABNORMAL HIGH (ref 0.0–2.0)
Acid-base deficit: 8.9 mmol/L — ABNORMAL HIGH (ref 0.0–2.0)
Bicarbonate: 16 mmol/L — ABNORMAL LOW (ref 20.0–28.0)
Bicarbonate: 17.9 mmol/L — ABNORMAL LOW (ref 20.0–28.0)
Bicarbonate: 18.5 mmol/L — ABNORMAL LOW (ref 20.0–28.0)
Drawn by: 21338
Drawn by: 27027
Drawn by: 27027
FIO2: 100 %
FIO2: 50 %
FIO2: 70 %
MECHVT: 480 mL
MECHVT: 480 mL
MECHVT: 480 mL
O2 Saturation: 100 %
O2 Saturation: 100 %
O2 Saturation: 99.3 %
PEEP: 5 cmH2O
PEEP: 5 cmH2O
PEEP: 5 cmH2O
Patient temperature: 36.5
Patient temperature: 36.7
Patient temperature: 37.1
RATE: 26 {breaths}/min
RATE: 26 {breaths}/min
RATE: 26 {breaths}/min
pCO2 arterial: 31 mmHg — ABNORMAL LOW (ref 32–48)
pCO2 arterial: 34 mmHg (ref 32–48)
pCO2 arterial: 39 mmHg (ref 32–48)
pH, Arterial: 7.26 — ABNORMAL LOW (ref 7.35–7.45)
pH, Arterial: 7.32 — ABNORMAL LOW (ref 7.35–7.45)
pH, Arterial: 7.34 — ABNORMAL LOW (ref 7.35–7.45)
pO2, Arterial: 142 mmHg — ABNORMAL HIGH (ref 83–108)
pO2, Arterial: 142 mmHg — ABNORMAL HIGH (ref 83–108)
pO2, Arterial: 87 mmHg (ref 83–108)

## 2024-10-30 LAB — HEPATIC FUNCTION PANEL
ALT: 130 U/L — ABNORMAL HIGH (ref 0–44)
AST: 123 U/L — ABNORMAL HIGH (ref 15–41)
Albumin: 2.2 g/dL — ABNORMAL LOW (ref 3.5–5.0)
Alkaline Phosphatase: 48 U/L (ref 38–126)
Bilirubin, Direct: <0.1 mg/dL (ref 0.0–0.2)
Total Bilirubin: <0.2 mg/dL (ref 0.0–1.2)
Total Protein: 3.4 g/dL — ABNORMAL LOW (ref 6.5–8.1)

## 2024-10-30 LAB — POCT I-STAT 7, (LYTES, BLD GAS, ICA,H+H)
Acid-base deficit: 10 mmol/L — ABNORMAL HIGH (ref 0.0–2.0)
Bicarbonate: 17.9 mmol/L — ABNORMAL LOW (ref 20.0–28.0)
Calcium, Ion: 1.18 mmol/L (ref 1.15–1.40)
HCT: 33 % — ABNORMAL LOW (ref 36.0–46.0)
Hemoglobin: 11.2 g/dL — ABNORMAL LOW (ref 12.0–15.0)
O2 Saturation: 98 %
Potassium: 3.5 mmol/L (ref 3.5–5.1)
Sodium: 141 mmol/L (ref 135–145)
TCO2: 19 mmol/L — ABNORMAL LOW (ref 22–32)
pCO2 arterial: 48.7 mmHg — ABNORMAL HIGH (ref 32–48)
pH, Arterial: 7.174 — CL (ref 7.35–7.45)
pO2, Arterial: 123 mmHg — ABNORMAL HIGH (ref 83–108)

## 2024-10-30 LAB — BASIC METABOLIC PANEL WITH GFR
Anion gap: 11 (ref 5–15)
BUN: 27 mg/dL — ABNORMAL HIGH (ref 6–20)
CO2: 19 mmol/L — ABNORMAL LOW (ref 22–32)
Calcium: 7.5 mg/dL — ABNORMAL LOW (ref 8.9–10.3)
Chloride: 108 mmol/L (ref 98–111)
Creatinine, Ser: 1.47 mg/dL — ABNORMAL HIGH (ref 0.44–1.00)
GFR, Estimated: 41 mL/min — ABNORMAL LOW (ref >60)
Glucose, Bld: 241 mg/dL — ABNORMAL HIGH (ref 70–99)
Potassium: 3.2 mmol/L — ABNORMAL LOW (ref 3.5–5.1)
Sodium: 137 mmol/L (ref 135–145)

## 2024-10-30 LAB — CBC
HCT: 35 % — ABNORMAL LOW (ref 36.0–46.0)
Hemoglobin: 11.3 g/dL — ABNORMAL LOW (ref 12.0–15.0)
MCH: 29.6 pg (ref 26.0–34.0)
MCHC: 32.3 g/dL (ref 30.0–36.0)
MCV: 91.6 fL (ref 80.0–100.0)
Platelets: 323 K/uL (ref 150–400)
RBC: 3.82 MIL/uL — ABNORMAL LOW (ref 3.87–5.11)
RDW: 14.9 % (ref 11.5–15.5)
WBC: 2 K/uL — ABNORMAL LOW (ref 4.0–10.5)
nRBC: 1 % — ABNORMAL HIGH (ref 0.0–0.2)

## 2024-10-30 LAB — LACTIC ACID, PLASMA: Lactic Acid, Venous: 3.3 mmol/L (ref 0.5–1.9)

## 2024-10-30 LAB — COOXEMETRY PANEL
Carboxyhemoglobin: 1.2 % (ref 0.5–1.5)
Methemoglobin: 1.3 % (ref 0.0–1.5)
O2 Saturation: 70.8 %
Total hemoglobin: 12.3 g/dL (ref 12.0–16.0)

## 2024-10-30 LAB — VITAMIN B12: Vitamin B-12: 2693 pg/mL — ABNORMAL HIGH (ref 180–914)

## 2024-10-30 LAB — HEMOGLOBIN A1C
Hgb A1c MFr Bld: 5.3 % (ref 4.8–5.6)
Mean Plasma Glucose: 105.41 mg/dL

## 2024-10-30 LAB — HIV ANTIBODY (ROUTINE TESTING W REFLEX): HIV Screen 4th Generation wRfx: NONREACTIVE

## 2024-10-30 LAB — LIPASE, BLOOD: Lipase: 882 U/L — ABNORMAL HIGH (ref 11–51)

## 2024-10-30 LAB — MAGNESIUM: Magnesium: 1.5 mg/dL — ABNORMAL LOW (ref 1.7–2.4)

## 2024-10-30 LAB — FERRITIN: Ferritin: 81 ng/mL (ref 11–307)

## 2024-10-30 MED ORDER — INSULIN GLARGINE-YFGN 100 UNIT/ML ~~LOC~~ SOLN
5.0000 [IU] | Freq: Two times a day (BID) | SUBCUTANEOUS | Status: DC
  Administered 2024-10-30 – 2024-10-31 (×3): 5 [IU] via SUBCUTANEOUS
  Filled 2024-10-30 (×5): qty 0.05

## 2024-10-30 MED ORDER — MAGNESIUM SULFATE 4 GM/100ML IV SOLN
4.0000 g | Freq: Once | INTRAVENOUS | Status: AC
  Administered 2024-10-30: 4 g via INTRAVENOUS
  Filled 2024-10-30: qty 100

## 2024-10-30 MED ORDER — POTASSIUM CHLORIDE 10 MEQ/100ML IV SOLN
10.0000 meq | INTRAVENOUS | Status: AC
Start: 2024-10-30 — End: 2024-10-30
  Administered 2024-10-30 (×6): 10 meq via INTRAVENOUS
  Filled 2024-10-30 (×6): qty 100

## 2024-10-30 MED ORDER — ACETAMINOPHEN 10 MG/ML IV SOLN
1000.0000 mg | Freq: Once | INTRAVENOUS | Status: AC
  Administered 2024-10-30: 1000 mg via INTRAVENOUS
  Filled 2024-10-30: qty 100

## 2024-10-30 MED ORDER — PANTOPRAZOLE SODIUM 40 MG IV SOLR
40.0000 mg | Freq: Two times a day (BID) | INTRAVENOUS | Status: DC
  Administered 2024-10-30 – 2024-11-13 (×29): 40 mg via INTRAVENOUS
  Filled 2024-10-30 (×29): qty 10

## 2024-10-30 MED ORDER — INSULIN ASPART 100 UNIT/ML IJ SOLN
0.0000 [IU] | INTRAMUSCULAR | Status: DC
  Administered 2024-10-30: 8 [IU] via SUBCUTANEOUS
  Administered 2024-10-30: 3 [IU] via SUBCUTANEOUS
  Administered 2024-10-30: 5 [IU] via SUBCUTANEOUS
  Filled 2024-10-30: qty 3
  Filled 2024-10-30: qty 5
  Filled 2024-10-30: qty 8

## 2024-10-30 MED ORDER — CHLORHEXIDINE GLUCONATE CLOTH 2 % EX PADS
6.0000 | MEDICATED_PAD | Freq: Every day | CUTANEOUS | Status: DC
  Administered 2024-10-30 – 2024-11-18 (×20): 6 via TOPICAL

## 2024-10-30 MED ORDER — THIAMINE HCL 100 MG/ML IJ SOLN
100.0000 mg | INTRAMUSCULAR | Status: AC
  Administered 2024-10-30 – 2024-11-10 (×12): 100 mg via INTRAVENOUS
  Filled 2024-10-30 (×12): qty 2

## 2024-10-30 MED ORDER — ACETAMINOPHEN 650 MG RE SUPP
650.0000 mg | RECTAL | Status: DC | PRN
  Administered 2024-10-31 (×2): 650 mg via RECTAL
  Filled 2024-10-30 (×3): qty 1

## 2024-10-30 MED ORDER — SODIUM BICARBONATE 8.4 % IV SOLN
50.0000 meq | Freq: Once | INTRAVENOUS | Status: AC
  Administered 2024-10-30: 50 meq via INTRAVENOUS
  Filled 2024-10-30: qty 50

## 2024-10-30 MED ORDER — LACTATED RINGERS IV BOLUS
1000.0000 mL | Freq: Once | INTRAVENOUS | Status: AC
  Administered 2024-10-30: 1000 mL via INTRAVENOUS

## 2024-10-30 MED ORDER — FENTANYL CITRATE (PF) 50 MCG/ML IJ SOSY: 50.0000 ug | PREFILLED_SYRINGE | INTRAMUSCULAR | Status: DC | PRN

## 2024-10-30 MED ORDER — INSULIN ASPART 100 UNIT/ML IJ SOLN
4.0000 [IU] | INTRAMUSCULAR | Status: DC
  Administered 2024-10-31: 4 [IU] via SUBCUTANEOUS
  Filled 2024-10-30 (×2): qty 4

## 2024-10-30 MED ORDER — ORAL CARE MOUTH RINSE
15.0000 mL | OROMUCOSAL | Status: DC
  Administered 2024-10-30 – 2024-11-05 (×73): 15 mL via OROMUCOSAL

## 2024-10-30 MED ORDER — FENTANYL CITRATE (PF) 50 MCG/ML IJ SOSY
50.0000 ug | PREFILLED_SYRINGE | INTRAMUSCULAR | Status: DC | PRN
  Administered 2024-10-30 – 2024-10-31 (×2): 50 ug via INTRAVENOUS
  Filled 2024-10-30 (×2): qty 1

## 2024-10-30 MED ORDER — INSULIN ASPART 100 UNIT/ML IJ SOLN
0.0000 [IU] | INTRAMUSCULAR | Status: DC
  Administered 2024-10-30: 4 [IU] via SUBCUTANEOUS
  Administered 2024-10-30: 7 [IU] via SUBCUTANEOUS
  Administered 2024-10-30: 4 [IU] via SUBCUTANEOUS
  Administered 2024-10-31 (×2): 3 [IU] via SUBCUTANEOUS
  Filled 2024-10-30 (×2): qty 4
  Filled 2024-10-30: qty 3
  Filled 2024-10-30: qty 4
  Filled 2024-10-30: qty 7
  Filled 2024-10-30: qty 3

## 2024-10-30 MED ORDER — NOREPINEPHRINE 16 MG/250ML-% IV SOLN
0.0000 ug/min | INTRAVENOUS | Status: DC
Start: 2024-10-30 — End: 2024-11-02
  Administered 2024-10-30 – 2024-10-31 (×3): 40 ug/min via INTRAVENOUS
  Administered 2024-10-31: 35 ug/min via INTRAVENOUS
  Administered 2024-10-31: 40 ug/min via INTRAVENOUS
  Administered 2024-10-31: 26 ug/min via INTRAVENOUS
  Filled 2024-10-30 (×6): qty 250

## 2024-10-30 MED ORDER — ORAL CARE MOUTH RINSE
15.0000 mL | OROMUCOSAL | Status: DC | PRN
Start: 2024-10-30 — End: 2024-11-02

## 2024-10-30 MED ORDER — HYDROCORTISONE SOD SUC (PF) 100 MG IJ SOLR
100.0000 mg | Freq: Two times a day (BID) | INTRAMUSCULAR | Status: DC
  Administered 2024-10-30 – 2024-11-04 (×11): 100 mg via INTRAVENOUS
  Filled 2024-10-30 (×11): qty 2

## 2024-10-30 MED ORDER — PROPOFOL 1000 MG/100ML IV EMUL
0.0000 ug/kg/min | INTRAVENOUS | Status: DC
  Administered 2024-10-30: 50 ug/kg/min via INTRAVENOUS
  Administered 2024-10-30: 35 ug/kg/min via INTRAVENOUS
  Administered 2024-10-30: 50 ug/kg/min via INTRAVENOUS
  Administered 2024-10-30: 40 ug/kg/min via INTRAVENOUS
  Administered 2024-10-30: 35 ug/kg/min via INTRAVENOUS
  Administered 2024-10-31: 40 ug/kg/min via INTRAVENOUS
  Administered 2024-10-31: 35 ug/kg/min via INTRAVENOUS
  Administered 2024-10-31: 70 ug/kg/min via INTRAVENOUS
  Administered 2024-10-31: 40 ug/kg/min via INTRAVENOUS
  Administered 2024-11-01 – 2024-11-02 (×4): 20 ug/kg/min via INTRAVENOUS
  Filled 2024-10-30 (×16): qty 100

## 2024-10-30 MED ORDER — SODIUM BICARBONATE 8.4 % IV SOLN
INTRAVENOUS | Status: DC
  Filled 2024-10-30 (×5): qty 150

## 2024-10-30 NOTE — Progress Notes (Signed)
 eLink Physician-Brief Progress Note Patient Name: Morgan [name] DOB: [birth year] MRN: [id number]   Date of Service  10/30/2024  HPI/Events of Note  58 year old female with a history of laparoscopic Roux-en-Y in 2018 bowel obstruction secondary to adhesions in 2020 who presents with intractable nausea and vomiting found to have a small bowel obstruction with perforation status post exploratory laparotomy, lysis of adhesions, left open for likely secondary second look returning to the ICU mechanically ventilated in shock.  Patient has normal vitals saturating 94% on mechanical ventilation.  Propofol  and intermittent fentanyl .  Patient is now on norepinephrine , vasopressin  and starting epinephrine .  Status post Zosyn .  Preoperative results show hypokalemia, elevated creatinine, elevated lipase and transaminitis.  Elevated lactate with leukopenia.  eICU Interventions  Checking postoperative labs including ABG.    Initiate epinephrine  infusion.  Anticipate initiation of bicarb infusion.  Trend LFTs, monitor for hypoglycemia, IVF in the setting of likely pancreatitis  Chest radiograph to verify ET tube and central line.  Maintain mechanical ventilation, daily spontaneous awakening/breathing trials.  RASS goal -3 with open abdomen  Maintain Zosyn  for perioperative coverage  Full ground team evaluation pending  DVT prophylaxis with enoxaparin  GI prophylaxis with pantoprazole      Intervention Category Evaluation Type: New Patient Evaluation  Joran Kallal 10/30/2024, 12:03 AM

## 2024-10-30 NOTE — Inpatient Diabetes Management (Signed)
 Inpatient Diabetes Program Recommendations  AACE/ADA: New Consensus Statement on Inpatient Glycemic Control (2015)  Target Ranges:  Prepandial:   less than 140 mg/dL      Peak postprandial:   less than 180 mg/dL (1-2 hours)      Critically ill patients:  140 - 180 mg/dL   Lab Results  Component Value Date   GLUCAP 286 (H) 10/30/2024   HGBA1C 5.3 10/30/2024    Review of Glycemic Control  Latest Reference Range & Units 10/30/24 01:01 10/30/24 03:42 10/30/24 07:30 10/30/24 11:19 10/30/24 11:29  Glucose-Capillary 70 - 99 mg/dL 853 (H) 808 (H) 759 (H) 261 (H) 286 (H)   Diabetes history: DM 2 Outpatient Diabetes medications:  No longer takes meds after gastric bypass Current orders for Inpatient glycemic control:  Novolog  0-15 units q 4 hours Solucortef 100 mg bid Inpatient Diabetes Program Recommendations:    If appropriate, consider ICU glycemic control orders (Novolog  2-6 units q 4 hours).  Consider adding Semglee 5 units bid.   Thanks,  Randall Bullocks, RN, BC-ADM Inpatient Diabetes Coordinator Pager [phone]  (8a-5p)

## 2024-10-30 NOTE — Plan of Care (Signed)
  Problem: Clinical Measurements: Goal: Respiratory complications will improve Outcome: Not Progressing Goal: Cardiovascular complication will be avoided Outcome: Not Progressing   Problem: Elimination: Goal: Will not experience complications related to bowel motility Outcome: Not Progressing Goal: Will not experience complications related to urinary retention Outcome: Not Progressing

## 2024-10-30 NOTE — TOC Initial Note (Signed)
 Transition of Care [facility]) - Initial/Assessment Note    Patient Details  Name: Morgan [name] MRN: [id number] Date of Birth: Sep 26, 1966  Transition of Care [facility]) CM/SW Contact:    Bascom Service, RN Phone Number: 10/30/2024, 9:36 AM  Clinical Narrative: Beatris to Harlene James(HCPOA,niece) will place copy HCPOA in shadow chart. D/c plan home.s/p ex lap LOA. On vent, abd wound vac, GT. For surgery closure of abd wound.Monitor for d/c plans.                Expected Discharge Plan: Home/Self Care Barriers to Discharge: Continued Medical Work up   Patient Goals and CMS Choice Patient states their goals for this hospitalization and ongoing recovery are:: Home CMS Medicare.gov Compare Post Acute Care list provided to:: Patient Represenative (must comment) (Jessica(HCPOA, niece)) Choice offered to / list presented to : [facility] POA / Guardian [place] ownership interest in [facility].provided to:: [facility] POA / Guardian    Expected Discharge Plan and Services In-house Referral:  (rw) Discharge Planning Services: CM Consult   Living arrangements for the past 2 months: Single Family Home                                      Prior Living Arrangements/Services Living arrangements for the past 2 months: Single Family Home Lives with:: Relatives              Current home services: DME    Activities of Daily Living   ADL Screening (condition at time of admission) Independently performs ADLs?: Yes (appropriate for developmental age) Is the patient deaf or have difficulty hearing?: No Does the patient have difficulty seeing, even when wearing glasses/contacts?: No Does the patient have difficulty concentrating, remembering, or making decisions?: No  Permission Sought/Granted                  Emotional Assessment              Admission diagnosis:  SBO (small bowel obstruction) (HCC) [K56.609] Patient Active Problem List   Diagnosis Date Noted    Spondylolysis of lumbar region 09/09/2022   Osteoarthritis of left shoulder 08/11/2020   S/P shoulder replacement, left 08/11/2020   AMS (altered mental status) 05/19/2019   SBO (small bowel obstruction) s/p lap LOA 03/19/2019 03/19/2019   Reactive hypoglycemia 02/12/2019   Chronic migraine without aura, with intractable migraine, so stated, with status migrainosus 11/20/2018   Lumbar radiculopathy 10/11/2018   Atelectasis    Hypoglycemia    Hypotension    Bradycardia    Epigastric pain 06/19/2018   History of adenomatous polyp of colon 10/23/2017   Hyperlipidemia associated with type 2 diabetes mellitus (HCC) 07/06/2017   GERD (gastroesophageal reflux disease) 03/21/2017   History of Roux-en-Y gastric bypass 2018 03/21/2017   Intractable chronic migraine without aura 06/04/2015   Abnormal uterine bleeding 05/13/2015   Pancreatitis 02/27/2014   Fatty liver disease, nonalcoholic 02/25/2014   Metabolic syndrome 02/20/2014   Vitamin D  deficiency    DM (diabetes mellitus) (HCC) 03/12/2013   Surgery, elective 06/12/2011   Hypothyroidism    Constipation, chronic    Vitamin B 12 deficiency    Allergic rhinitis 12/31/2010   BARRETTS ESOPHAGUS 12/31/2010   Gastroparesis 12/31/2010   Bilateral chronic knee pain 12/31/2010   Obstructive sleep apnea 09/27/2010   Migraine 09/27/2010   Cyst of ovary 10/19/2009   PCP:  [name], DO  Pharmacy:   [facility] [place], [place] - [address] [place] [address] Phone: [phone] Fax: [phone]     Social Drivers of Health (SDOH) Social History: SDOH Screenings   Food Insecurity: No Food Insecurity (10/30/2024)  Housing: Low Risk  (10/30/2024)  Transportation Needs: No Transportation Needs (10/30/2024)  Utilities: Not At Risk (10/30/2024)  Alcohol  Screen: Low Risk  (09/23/2024)  Depression (PHQ2-9): Medium Risk (06/28/2024)  Financial Resource Strain: Medium Risk (09/23/2024)   Physical Activity: Insufficiently Active (09/23/2024)  Social Connections: Moderately Isolated (09/23/2024)  Stress: Stress Concern Present (09/23/2024)  Tobacco Use: Low Risk  (10/29/2024)  Health Literacy: Adequate Health Literacy (09/23/2024)   SDOH Interventions:     Readmission Risk Interventions     No data to display

## 2024-10-30 NOTE — Progress Notes (Addendum)
 HR is 110 or so. Also had low grade temp given tylenol . Tried to bedside POCUS echo to evaluation IVC but poor windows. LV looks somewhat down.  She is very cold to touch and clammy. Glucose is fine. Query if there is cardiac component to this refractory shock state.  Will have nursing grab coox off of central line, unable to continuously transduce CVP as she only has 2 lumens running multiple meds.   Will also give amp bicarb to help with persistent acidosis   Tinnie FORBES Furth, PA-C [facility] 10/30/24 1:11 PM  Please see [email or web address] for pager details.  From 7A-7P if no response, please call [phone] After hours, please call ELink [phone]

## 2024-10-30 NOTE — Progress Notes (Addendum)
 Interval CCM note  Briefly, 58 year old female with history of Roux-en-Y gastric bypass who presented to the emergency department yesterday evening with abdominal pain, lactic acidosis and lipase greater than 2800.  She had CCS evaluation and went for exploratory laparotomy.  Found to have dense adhesive band that was obstructing her proximal small bowel as well as biliary leak without evidence of perforation.  She had a gastrostomy tube placed in the left open with a wound VAC.  She is in continuity. Also in septic shock, she returned from OR to ICU on Levophed , vasopressin , intubated on bicarb drip.  Exam Critically ill-appearing middle-age female, laying in bed PERRLA, NCAT, ET tube, OG tube S1-S2, no murmur or rub, no peripheral edema Lungs clear to auscultation bilaterally, vented on minimal settings Abdomen is rounded and soft, midline wound VAC with serosanguineous drainage, gastrostomy tube with bilious drainage Foley Sedated  Labs WBC 2, hemoglobin 11.3, platelets 323 K3.2, bicarb 19, BUN 27, SCR 1.47 Lipase greater than 2800 > 882 ABG pH 7.32, pCO2 31, pO2 142, HCO3 16 Mag 1.5 Lactate 5.1> 3.3  On Levophed  40, vasopressin  0.04, epi 6, propofol  50, sodium bicarb 150  Assessment and plan SBO with possible perforation s/p ex lap, G-tube placement E. coli bacteremia Septic shock Acute kidney injury secondary to septic ATN, hypoperfusion from shock state Leukopenia Pancreatitis Shock liver  - CCS primary, appreciate management - Plan for OR tomorrow for reexploration and possible closure - CCS checking vitamin levels this morning - IV thiamine  for 3 days - Strict n.p.o. - Continue Zosyn  for E. coli bacteremia - Start stress dose steroids (CCS okay with this) for refractory septic shock - Titrate vasopressors for MAP greater than 65 - Will try to titrate off epinephrine  first, unclear why this was started overnight other than if hypotensive on levo and vaso -repeat LFTs  this morning -Leukopenia may be just from her critical illness, however on chart review, has had some leukopenia in the past.  Checking her B12 and folate.  Will add on HIV. - Daily BMP, mag, Phos and replete as needed, strict INO, ensure renal perfusion, renally dose meds - Will keep intubated until she goes back for OR tomorrow - Repeat ABG now - Trend white blood cell fever and lactic curve   CC time: 40  Tinnie FORBES Furth, PA-C [facility] 10/30/24 10:36 AM  Please see [email or web address] for pager details.  From 7A-7P if no response, please call [phone] After hours, please call ELink [phone]

## 2024-10-30 NOTE — Consult Note (Addendum)
 NAME:  Morgan [name], MRN:  [id number], DOB:  08-22-1966, LOS: 1 ADMISSION DATE:  10/29/2024, CONSULTATION DATE:  10/30/2024 REFERRING MD:  Dr. Vernetta CHIEF COMPLAINT:  Small bowel Obstruction   History of Present Illness:  This 58 year old female woman with a history of Roux-en-Y gastric bypass in the past presents to us  with severe abdominal pain and intractable nausea and vomiting.  She also had lactic acidosis.  She went down for an expiratory laparotomy.  A dense adhesive band was found obstructing proximal small bowel near Roux-en-Y anastomosis.  There was no evidence of ischemia or perforation.  The adhesions were taken down.  New gastrostomy tube was placed.  Abdomen was left open with a wound VAC.  Patient is to return to OR for closure.  Postoperatively patient was remained intubated she is eating and effective doses of vasopressor therapy with Levophed  and vasopressin.  Her lactic acid came down from 5.1 to 3.3.  Patient is sedated with propofol  and vasopressin currently.  Bicarb drip is running because of metabolic acidosis.  Pertinent  Medical History   Past Medical History:  Diagnosis Date   Allergic rhinitis    Anemia     after gastric bypass in 2018   Anxiety    Barrett's esophagus    Bipolar affective disorder (HCC)    CAP (community acquired pneumonia) 07/20/2018   Chronic respiratory failure (HCC)    Constipation, chronic    Degenerative joint disease of spine    Depression    Diabetes mellitus without complication (HCC)    type 2    DM (diabetes mellitus) (HCC)    hypoglycemic per pt - no longer takes DM meds due to weight loss from gastric bypass in 2018   Dry eye    Family history of adverse reaction to anesthesia    nausea and vomiting   Gastroparesis    GERD (gastroesophageal reflux disease)    H/O shoulder replacement 08/11/2020   left shoulder   History of bariatric surgery 03/2017   History of hiatal hernia    HLD (hyperlipidemia) 03/12/2013    Hyperlipidemia    Hypertension    No HTN meds since weight loss from Gastric Bypass in 2018   Intractable chronic migraine without aura 06/04/2015   Migraine headache    Morbid obesity (HCC)    OSA (obstructive sleep apnea)    No cpap since gastric surgery   Osteoarthritis    bilateral knee   SBO (small bowel obstruction) (HCC) 03/19/2019   Sleep apnea    Unspecified hypothyroidism    Vitamin B 12 deficiency    Vitamin D  deficiency      Significant Hospital Events: Including procedures, antibiotic start and stop dates in addition to other pertinent events     Interim History / Subjective:  Patient is admitted postoperatively intubated on various vasopressors.  Objective    Blood pressure (!) 88/65, pulse (!) 111, temperature 97.9 F (36.6 C), resp. rate (!) 31, height 5' 4 (1.626 m), weight 59.9 kg, last menstrual period 10/17/2017, SpO2 97%.    Vent Mode: PRVC FiO2 (%):  [100 %] 100 % Set Rate:  [26 bmp] 26 bmp Vt Set:  [480 mL] 480 mL PEEP:  [5 cmH20] 5 cmH20 Plateau Pressure:  [8 cmH20] 8 cmH20   Intake/Output Summary (Last 24 hours) at 10/30/2024 0139 Last data filed at 10/30/2024 0125 Gross per 24 hour  Intake 3904.25 ml  Output --  Net 3904.25 ml   [employer]  10/29/24 1434  Weight: 59.9 kg    Examination:   Physical exam: General: Crtitically ill-appearing female, orally intubated HEENT: [place]/AT, eyes anicteric.  ETT in place. Neuro: Sedated, not following commands.  Eyes are closed.  Pupils 3 mm bilateral reactive to light Chest: Coarse breath sounds, no wheezes or rhonchi Heart: Regular rate and rhythm, no murmurs or gallops Abdomen: Soft, nondistended, Gtube post op present draining      Resolved problem list   Assessment and Plan  Small bowel obstruction status post exploratory laparotomy with lysis of adhesions Status post gastrostomy tube Status post wound VAC. Pancreatitis Elevated LFTs likely shock liver Acute blood loss anemia  -  Monitor gastrostomy output - VAC management and timing of closure of abdomen per general surgery - Hemoglobin dropped from 13-11.3.  Will monitor hemoglobin and transfuse if needed.  Acute hypoxic respiratory failure postoperatively intubated. Septic shock  - ABG reviewed - Decreased FiO2 to 70% repeat ABG in a few hours. - Ventilator bundle is in place - Maintain P plat is less than 30 - Maintain driving pressure 15 - Continue Zosyn  - Continue Levophed  and vasopressin.  Wean down Levophed  as able. - 3 L IV fluids given postoperatively. - Patient is currently on bicarb drip to continue for now.  Type 2 diabetes mellitus Hyperglycemia - Monitor blood sugars.  Is high we will add Lantus.  Currently on SSI every 4 hours.  Acute kidney injury Metabolic acidosis Lactic acidosis - Patient has started to produce urine. - Monitor BMPs repeat ABG. - Repeat lactic acid in the morning.  Full code On Lovenox  On pantoprazole   Labs   CBC: Recent Labs  Lab 10/29/24 1541  WBC 1.1*  NEUTROABS 1.0*  HGB 13.0  HCT 42.4  MCV 94.4  PLT 362    Basic Metabolic Panel: Recent Labs  Lab 10/29/24 1541  NA 142  K 3.3*  CL 108  CO2 21*  GLUCOSE 141*  BUN 25*  CREATININE 1.12*  CALCIUM  7.9*  MG 1.7   GFR: Estimated Creatinine Clearance: 47.9 mL/min (A) (by C-G formula based on SCr of 1.12 mg/dL (H)). Recent Labs  Lab 10/29/24 1541 10/29/24 1714 10/30/24 0031  WBC 1.1*  --   --   LATICACIDVEN  --  5.1* 3.3*    Liver Function Tests: Recent Labs  Lab 10/29/24 1541  AST 383*  ALT 272*  ALKPHOS 127*  BILITOT 0.3  PROT 5.6*  ALBUMIN  3.4*   Recent Labs  Lab 10/29/24 1541  LIPASE >2,800*   No results for input(s): AMMONIA in the last 168 hours.  ABG    Component Value Date/Time   PHART 7.26 (L) 10/30/2024 0005   PCO2ART 39 10/30/2024 0005   PO2ART 142 (H) 10/30/2024 0005   HCO3 17.9 (L) 10/30/2024 0005   ACIDBASEDEF 8.6 (H) 10/30/2024 0005   O2SAT 100  10/30/2024 0005     Coagulation Profile: Recent Labs  Lab 10/29/24 1710  INR 1.0    Cardiac Enzymes: No results for input(s): CKTOTAL, CKMB, CKMBINDEX, TROPONINI in the last 168 hours.  HbA1C: HB A1C (BAYER DCA - WAIVED)  Date/Time Value Ref Range Status  06/28/2024 08:40 AM 5.1 4.8 - 5.6 % Final    Comment:             Prediabetes: 5.7 - 6.4          Diabetes: >6.4          Glycemic control for adults with diabetes: <7.0   11/29/2023 08:24  AM 5.8 (H) 4.8 - 5.6 % Final    Comment:             Prediabetes: 5.7 - 6.4          Diabetes: >6.4          Glycemic control for adults with diabetes: <7.0     CBG: Recent Labs  Lab 10/30/24 0101  GLUCAP 146*    Review of Systems:   Review of systems cannot be done as the patient is sedated.  Past Medical History:  She,  has a past medical history of Allergic rhinitis, Anemia, Anxiety, Barrett's esophagus, Bipolar affective disorder (HCC), CAP (community acquired pneumonia) (07/20/2018), Chronic respiratory failure (HCC), Constipation, chronic, Degenerative joint disease of spine, Depression, Diabetes mellitus without complication (HCC), DM (diabetes mellitus) (HCC), Dry eye, Family history of adverse reaction to anesthesia, Gastroparesis, GERD (gastroesophageal reflux disease), H/O shoulder replacement (08/11/2020), History of bariatric surgery (03/2017), History of hiatal hernia, HLD (hyperlipidemia) (03/12/2013), Hyperlipidemia, Hypertension, Intractable chronic migraine without aura (06/04/2015), Migraine headache, Morbid obesity (HCC), OSA (obstructive sleep apnea), Osteoarthritis, SBO (small bowel obstruction) (HCC) (03/19/2019), Sleep apnea, Unspecified hypothyroidism, Vitamin B 12 deficiency, and Vitamin D  deficiency.   Surgical History:   Past Surgical History:  Procedure Laterality Date   ANKLE ARTHROSCOPY WITH RECONSTRUCTION Right 09/24/2019   Procedure: ANKLE ARTHROSCOPY DEBRIDEMENT TREATMENT OF OSTEOCHONDRAL LESION  TALUS. PRONEAL TENDON DEBRIDEMENT;  Surgeon: Elsa Lonni SAUNDERS, MD;  Location: [facility];  Service: Orthopedics;  Laterality: Right;  SURGERY REQUEST TIME 2 HOURS   APPENDECTOMY  1978   CHOLECYSTECTOMY  2005   COLONOSCOPY     disc repair  05/17/2019   with rods, lumbar spine   GASTRIC ROUX-EN-Y N/A 03/21/2017   Procedure: LAPAROSCOPIC ROUX-EN-Y GASTRIC, UPPER ENDO;  Surgeon: Camellia Blush, MD;  Location: [facility];  Service: General;  Laterality: N/A;   GASTROSTOMY N/A 06/19/2018   Procedure: LAPRASCOPIC INSERTION OF GASTROSTOMY TUBE;  Surgeon: Blush Camellia, MD;  Location: [facility];  Service: General;  Laterality: N/A;   GASTROSTOMY TUBE PLACEMENT Left    06/2018   HIATAL HERNIA REPAIR N/A 06/19/2018   Procedure: LAPAROSCOPIC REPAIR OF HIATAL HERNIA;  Surgeon: Blush Camellia, MD;  Location: [facility];  Service: General;  Laterality: N/A;   LAPAROSCOPIC LYSIS OF ADHESIONS  03/19/2019   Dr. Mitzie Freund   LAPAROSCOPY N/A 06/19/2018   Procedure: LAPAROSCOPY DIAGNOSTIC;  Surgeon: Blush Camellia, MD;  Location: [facility];  Service: General;  Laterality: N/A;   LAPAROSCOPY N/A 03/19/2019   Procedure: LAPAROSCOPY DIAGNOSTIC lysis of adhesions;  Surgeon: Freund Mitzie LABOR, MD;  Location: [facility];  Service: General;  Laterality: N/A;   LUMBAR LAMINECTOMY/DECOMPRESSION MICRODISCECTOMY Right 10/11/2018   Procedure: LAMINECTOMY AND FORAMINOTOMY RIGHT LUMBAR FOUR- LUMBAR FIVE;  Surgeon: Lanis Pupa, MD;  Location: [facility];  Service: Neurosurgery;  Laterality: Right;   MASS EXCISION Right 09/23/2021   Procedure: EXCISION MASS VOLAR ASPECT RIGHT HAND;  Surgeon: Murrell Drivers, MD;  Location: [facility];  Service: Orthopedics;  Laterality: Right;  45 MIN   SHOULDER ARTHROSCOPY  06/2011   left-dsc   TONSILLECTOMY  at age 79   TOTAL SHOULDER ARTHROPLASTY Left 08/11/2020   Procedure: TOTAL SHOULDER ARTHROPLASTY;  Surgeon: Josefina Chew, MD;  Location: [facility];   Service: Orthopedics;  Laterality: Left;   TRIGGER FINGER RELEASE  12/20/2012   Procedure: RELEASE TRIGGER FINGER/A-1 PULLEY;  Surgeon: Drivers SAUNDERS Murrell, MD;  Location: [facility];  Service: Orthopedics;  Laterality:  Left;  LEFT TRIGGER THUMB RELEASE   TRIGGER FINGER RELEASE Right 09/23/2021   Procedure: RELEASE A-1 PULLEY RIGHT INDEX FINGER;  Surgeon: Murrell Drivers, MD;  Location: [facility];  Service: Orthopedics;  Laterality: Right;     Social History:   reports that she has never smoked. She has been exposed to tobacco smoke. She has never used smokeless tobacco. She reports that she does not drink alcohol  and does not use drugs.   Family History:  Her family history includes Allergies in her brother, father, mother, and sister; Arthritis in her father and mother; Asthma in her father, sister, and sister; Cancer in her sister; Colon cancer in her sister; Colon polyps in her sister; Diabetes in her brother, father, mother, and sister; Early death (age of onset: 66) in her brother; GI problems in her mother and sister; Heart disease in her father and paternal aunt; Hyperlipidemia in her brother, father, mother, and sister; Hypertension in her brother and mother; Liver disease in her sister; Peripheral vascular disease in her father; Stroke in her sister; Stroke (age of onset: 32) in her mother. There is no history of Migraines.   Allergies Allergies  Allergen Reactions   Ketoconazole Hives and Swelling    SWELLING REACTION UNSPECIFIED    Pravachol  [Pravastatin  Sodium] Shortness Of Breath, Swelling and Anaphylaxis    Throat swelling   Statins Other (See Comments)    Leg cramps   Tape Dermatitis and Other (See Comments)    Steri-Strips   Codeine Hypertension    increased BP     Home Medications  Prior to Admission medications   Medication Sig Start Date End Date Taking? Authorizing Provider  Blood Pressure Monitoring KIT Check blood pressure daily 10/03/24    Swinyer, Rosaline HERO, NP  Calcium -Vitamin D -Vitamin K  650-12.5-40 MG-MCG-MCG CHEW Chew 1 each by mouth 2 (two) times daily.    [provider]  cyclobenzaprine  (FLEXERIL ) 10 MG tablet Take 1 tablet (10 mg total) by mouth 3 (three) times daily as needed for muscle spasms. 06/28/24   Jolinda Potter M, DO  desloratadine  (CLARINEX ) 5 MG tablet TAKE ONE TABLET DAILY 05/13/24   Jolinda Potter M, DO  dicyclomine  (BENTYL ) 20 MG tablet TAKE 1 TABLET EVERY 8 HOURS AS NEEDED FOR ABDOMINAL CRAMPING 12/29/23   Jolinda Potter M, DO  Erenumab -aooe 140 MG/ML SOAJ Inject 140 mg into the skin every 30 (thirty) days. 06/24/24   Lomax, Amy, NP  Evolocumab (REPATHA SURECLICK) 140 MG/ML SOAJ Inject 140 mg into the skin every 14 (fourteen) days. 09/23/24   Swinyer, Rosaline HERO, NP  ezetimibe  (ZETIA ) 10 MG tablet TAKE ONE TABLET DAILY 08/13/24   Jolinda Potter M, DO  gabapentin  (NEURONTIN ) 300 MG capsule Takes 2 capsules in morning, one cap at lunch, 2 caps at bedtime 06/03/24   Lomax, Amy, NP  Glucagon , rDNA, (GLUCAGON  EMERGENCY IJ) Inject 1 Syringe as directed daily as needed (emergency low blood sugar).     [provider]  glucose blood (ONETOUCH ULTRA) test strip Check BS four times a day Dx E11.9 09/11/23   Jolinda Potter M, DO  levothyroxine  (SYNTHROID ) 112 MCG tablet TAKE ONE TABLET ONCE DAILY BEFORE BREAKFAST 07/02/24   Jolinda Potter M, DO  linaclotide  (LINZESS ) 290 MCG CAPS capsule Take 1 capsule (290 mcg total) by mouth daily before breakfast. 07/02/24   Jolinda Potter HERO, DO  loratadine  (CLARITIN ) 10 MG tablet Take 10 mg by mouth daily as needed for allergies.    [provider]  metoprolol tartrate (LOPRESSOR) 50 MG tablet Take 1 tablet (50 mg total) by mouth once for 1 dose. Take 90-120 minutes prior to scan. Hold for SBP less than 110. 09/23/24 09/23/24  Swinyer, Rosaline HERO, NP  mometasone  (NASONEX ) 50 MCG/ACT nasal spray USE 2 SPRAYS IN EACH NOSTRIL ONCE DAILY (REPLACES  FLONASE ) 07/30/24   [name], DO  Multiple Vitamins-Minerals (BARIATRIC MULTIVITAMINS/IRON  PO) Take 1 tablet by mouth daily.    [provider]  MYRBETRIQ  25 MG TB24 tablet TAKE ONE TABLET DAILY 08/13/24   Jolinda Norene M, DO  nystatin  (MYCOSTATIN /NYSTOP ) powder APPLY TO AFFECTED AREA OF GROIN RASH TWICE A DAY FOR 7 TO 10 DAYS PER FLARE 11/29/23   Jolinda Norene M, DO  pantoprazole  (PROTONIX ) 40 MG tablet TAKE ONE TABLET DAILY 07/22/24   Jolinda Norene M, DO  polyethylene glycol (MIRALAX  / GLYCOLAX ) packet Take 17 g by mouth at bedtime.    [provider]  Probiotic Product (PROBIOTIC DAILY PO) Take 1 capsule by mouth daily.    [provider]  sertraline  (ZOLOFT ) 100 MG tablet TAKE TWO TABLETS DAILY AS DIRECTED 07/02/24   Jolinda Norene M, DO  Simethicone  (GAS-X EXTRA STRENGTH) 125 MG CAPS Take 125 mg by mouth in the morning, at noon, and at bedtime.    [provider]  SUMAtriptan  (IMITREX ) 100 MG tablet Take 1 tablet (100 mg total) by mouth once as needed for up to 1 dose for migraine. May repeat in 2 hours if headache persists or recurs. 06/03/24   Lomax, Amy, NP  ziprasidone  (GEODON ) 60 MG capsule TAKE ONE CAPSULE AT BEDTIME 08/13/24   [name], DO     Critical care time: 80 Minutes      Tamela Stakes, MD  Attending Physician, Critical Care Medicine [facility] See Amion for pager If no response to pager, please call [phone] until 7pm After 7pm, Please call E-link [phone]

## 2024-10-30 NOTE — Progress Notes (Signed)
 PHARMACY - PHYSICIAN COMMUNICATION CRITICAL VALUE ALERT - BLOOD CULTURE IDENTIFICATION (BCID)  Morgan [name] is an 58 y.o. female who presented to [facility] on 10/29/2024 with a chief complaint of abdominal pain.  Assessment:  S/p emergent ex lap with G-tube placement on 11/18 due to SBO with possible perforation. Pt remains intubated on multiple pressors.   BCID + 2/4 E.coli   Name of physician (or Provider) Contacted: Burnard Banter, PA-C  Current antibiotics: piperacillin /tazobactam  Changes to prescribed antibiotics recommended: Could consider narrowing to ceftriaxone  + metronidazole to continue to cover for anaerobes as well. However, 95% of isolates sensitive to piperacillin /tazobactam according to antibiogram data. CCS would like to continue with piperacillin /tazobactam given critical illness.   Results for orders placed or performed during the hospital encounter of 10/29/24  Blood Culture ID Panel (Reflexed) (Collected: 10/29/2024  5:37 PM)  Result Value Ref Range   Enterococcus faecalis NOT DETECTED NOT DETECTED   Enterococcus Faecium NOT DETECTED NOT DETECTED   Listeria monocytogenes NOT DETECTED NOT DETECTED   Staphylococcus species NOT DETECTED NOT DETECTED   Staphylococcus aureus (BCID) NOT DETECTED NOT DETECTED   Staphylococcus epidermidis NOT DETECTED NOT DETECTED   Staphylococcus lugdunensis NOT DETECTED NOT DETECTED   Streptococcus species NOT DETECTED NOT DETECTED   Streptococcus agalactiae NOT DETECTED NOT DETECTED   Streptococcus pneumoniae NOT DETECTED NOT DETECTED   Streptococcus pyogenes NOT DETECTED NOT DETECTED   A.calcoaceticus-baumannii NOT DETECTED NOT DETECTED   Bacteroides fragilis NOT DETECTED NOT DETECTED   Enterobacterales DETECTED (A) NOT DETECTED   Enterobacter cloacae complex NOT DETECTED NOT DETECTED   Escherichia coli DETECTED (A) NOT DETECTED   Klebsiella aerogenes NOT DETECTED NOT DETECTED   Klebsiella oxytoca NOT DETECTED NOT DETECTED    Klebsiella pneumoniae NOT DETECTED NOT DETECTED   Proteus species NOT DETECTED NOT DETECTED   Salmonella species NOT DETECTED NOT DETECTED   Serratia marcescens NOT DETECTED NOT DETECTED   Haemophilus influenzae NOT DETECTED NOT DETECTED   Neisseria meningitidis NOT DETECTED NOT DETECTED   Pseudomonas aeruginosa NOT DETECTED NOT DETECTED   Stenotrophomonas maltophilia NOT DETECTED NOT DETECTED   Candida albicans NOT DETECTED NOT DETECTED   Candida auris NOT DETECTED NOT DETECTED   Candida glabrata NOT DETECTED NOT DETECTED   Candida krusei NOT DETECTED NOT DETECTED   Candida parapsilosis NOT DETECTED NOT DETECTED   Candida tropicalis NOT DETECTED NOT DETECTED   Cryptococcus neoformans/gattii NOT DETECTED NOT DETECTED   CTX-M ESBL NOT DETECTED NOT DETECTED   Carbapenem resistance IMP NOT DETECTED NOT DETECTED   Carbapenem resistance KPC NOT DETECTED NOT DETECTED   Carbapenem resistance NDM NOT DETECTED NOT DETECTED   Carbapenem resist OXA 48 LIKE NOT DETECTED NOT DETECTED   Carbapenem resistance VIM NOT DETECTED NOT DETECTED    Ronal CHRISTELLA Rav, PharmD 10/30/2024  9:28 AM

## 2024-10-30 NOTE — Progress Notes (Addendum)
 1 Day Post-Op  Subjective: Sedated on vent, critically ill on 3 pressors.  Aline in place  ROS: unable, on vent  Objective: Vital signs in last 24 hours: Temp:  [97.8 F (36.6 C)-99.2 F (37.3 C)] 99.2 F (37.3 C) (11/19 0730) Pulse Rate:  [93-111] 102 (11/19 0600) Resp:  [16-31] 28 (11/19 0313) BP: (69-103)/(53-79) 90/70 (11/19 0600) SpO2:  [87 %-100 %] 100 % (11/19 0801) Arterial Line BP: (71-256)/(45-70) 87/65 (11/19 0700) FiO2 (%):  [50 %-100 %] 50 % (11/19 0801) Weight:  [59.9 kg] 59.9 kg (11/18 1434)    Intake/Output from previous day: 11/18 0701 - 11/19 0700 In: 9344.7 [I.V.:3843.7; IV Piggyback:5501] Out: 875 [Urine:150; Drains:725] Intake/Output this shift: Total I/O In: 1006.8 [I.V.:940.6; IV Piggyback:66.2] Out: -   PE: Gen: critically ill, sedated on vent Heart: regular, mildly tachy Lungs: on vent Abd: soft, ABthera in place with serosang output, g-tube with bilious output GU: foley in place with minimal yellow urine output  Lab Results:  Recent Labs    10/29/24 1541 10/29/24 2131 10/30/24 0220  WBC 1.1*  --  2.0*  HGB 13.0 11.2* 11.3*  HCT 42.4 33.0* 35.0*  PLT 362  --  323   BMET Recent Labs    10/29/24 1541 10/29/24 2131 10/30/24 0220  NA 142 141 137  K 3.3* 3.5 3.2*  CL 108  --  108  CO2 21*  --  19*  GLUCOSE 141*  --  241*  BUN 25*  --  27*  CREATININE 1.12*  --  1.47*  CALCIUM  7.9*  --  7.5*   PT/INR Recent Labs    10/29/24 1710  LABPROT 13.3  INR 1.0   CMP     Component Value Date/Time   NA 137 10/30/2024 0220   NA 142 09/23/2024 1030   K 3.2 (L) 10/30/2024 0220   CL 108 10/30/2024 0220   CO2 19 (L) 10/30/2024 0220   GLUCOSE 241 (H) 10/30/2024 0220   BUN 27 (H) 10/30/2024 0220   BUN 17 09/23/2024 1030   CREATININE 1.47 (H) 10/30/2024 0220   CREATININE 0.82 06/18/2013 1009   CALCIUM  7.5 (L) 10/30/2024 0220   PROT 5.6 (L) 10/29/2024 1541   PROT 6.5 06/28/2024 0842   ALBUMIN  3.4 (L) 10/29/2024 1541   ALBUMIN   4.5 06/28/2024 0842   AST 383 (H) 10/29/2024 1541   ALT 272 (H) 10/29/2024 1541   ALKPHOS 127 (H) 10/29/2024 1541   BILITOT 0.3 10/29/2024 1541   BILITOT 0.3 06/28/2024 0842   GFRNONAA 41 (L) 10/30/2024 0220   GFRNONAA 87 06/18/2013 1009   GFRAA >60 08/04/2020 1038   GFRAA >89 06/18/2013 1009   Lipase     Component Value Date/Time   LIPASE 882 (H) 10/30/2024 0220       Studies/Results: DG CHEST PORT 1 VIEW Result Date: 10/30/2024 EXAM: 1 VIEW(S) XRAY OF THE CHEST 10/30/2024 12:22:00 AM COMPARISON: 10/29/2024 CLINICAL HISTORY: Intubation of airway performed without difficulty. FINDINGS: LINES, TUBES AND DEVICES: Endotracheal tube in place with tip 4 cm above the carina. Right IJ central venous catheter in place with tip overlying the cavoatrial junction region. LUNGS AND PLEURA: Low lung volume. Hazy opacities in left lower lung zone, likely representing atelectasis and/or pneumonia. Small left pleural effusion. No pneumothorax. HEART AND MEDIASTINUM: No acute abnormality of the cardiac and mediastinal silhouettes. BONES AND SOFT TISSUES: Left shoulder arthroplasty noted. Surgical clips in right upper quadrant, consistent with prior cholecystectomy. IMPRESSION: 1. Hazy opacities in the left  lower lung zone, likely representing atelectasis and/or pneumonia. 2. Small left pleural effusion. Electronically signed by: Morgane Naveau MD 10/30/2024 12:32 AM EST RP Workstation: [id number]   CT ABDOMEN PELVIS W CONTRAST Result Date: 10/29/2024 CLINICAL DATA:  Mid abdominal pain, nausea, and vomiting EXAM: CT ABDOMEN AND PELVIS WITH CONTRAST TECHNIQUE: Multidetector CT imaging of the abdomen and pelvis was performed using the standard protocol following bolus administration of intravenous contrast. RADIATION DOSE REDUCTION: This exam was performed according to the departmental dose-optimization program which includes automated exposure control, adjustment of the mA and/or kV according to patient size  and/or use of iterative reconstruction technique. CONTRAST:  OMNIPAQUE  IOHEXOL  300 MG/ML  SOLN COMPARISON:  CT abdomen and pelvis dated 03/19/2019 FINDINGS: Lower chest: Bilateral lower lobe subsegmental atelectasis. Trace bilateral pleural effusions. Partially imaged heart size is normal. Hepatobiliary: Atrophic left hepatic lobe. No intra or extrahepatic biliary ductal dilation. Cholecystectomy. Pancreas: No focal lesions or main ductal dilation. Spleen: 9 mm hypodensity in the spleen (2:29), likely benign cyst. Adrenals/Urinary Tract: No adrenal nodules. No suspicious renal mass, calculi or hydronephrosis. 10 mm minimally complicated cyst in the anterior interpolar left kidney (11:12). No focal bladder wall thickening. Stomach/Bowel: Postsurgical changes of Roux-en-Y gastric bypass. Marked dilation of the excluded stomach and duodenum to the level of the duodenojejunal junction, where there is abrupt luminal caliber transition (9:118). There is also apparent tethering of the gastric body at this level (7:50) as well as marked luminal narrowing of the transverse colon (2:42). Mural thickening and enhancement of the additional small bowel loops throughout the abdomen, many of which are underdistended, predominantly in the left lower quadrant. Distal transverse, descending, and rectosigmoid colon are underdistended. Appendix is not discretely seen. Vascular/Lymphatic: Aortic atherosclerosis. No enlarged abdominal or pelvic lymph nodes. Reproductive: Subtly delineated right adnexal cystic structure measures 3.4 cm (2:74). No left adnexal mass. Other: Moderate volume ascites.  No free air. Musculoskeletal: No acute or abnormal lytic or blastic osseous lesions. Postsurgical changes of L3-5 spinal fusion. Hardware appears intact. Multilevel degenerative changes of the partially imaged thoracic and lumbar spine. IMPRESSION: 1. Marked dilation of the excluded stomach and duodenum to the level of the duodenojejunal  junction, where there is abrupt luminal caliber transition associated with apparent tethering of the gastric body as well as marked luminal narrowing of the transverse colon. Findings are suspicious for obstruction of the excluded stomach and duodenum, likely secondary to adhesions. Of note, an adhesion was demonstrated causing obstruction in this area when the patient last presented with similar CT appearance on 03/19/2019. Recommend surgical consultation. 2. Mural thickening and enhancement of the additional small bowel loops throughout the abdomen, predominantly in the left lower quadrant, which may be reactive or reflect enteritis. 3. Moderate volume ascites. 4. Subtly delineated right adnexal cystic structure measures 3.4 cm. Recommend nonemergent pelvic ultrasound for further evaluation. 5. Trace bilateral pleural effusions. 6.  Aortic Atherosclerosis (ICD10-I70.0). Electronically Signed   By: Limin  Xu M.D.   On: 10/29/2024 19:17   DG Abdomen Acute W/Chest Result Date: 10/29/2024 EXAM: UPRIGHT AND SUPINE XRAY VIEWS OF THE ABDOMEN AND 4 VIEW(S) OF THE CHEST 10/29/2024 04:31:00 PM COMPARISON: 07/21/2018 CLINICAL HISTORY: vomiting with epigastric abdominal pain FINDINGS: LUNGS AND PLEURA: No consolidation or pulmonary edema. No pleural effusion or pneumothorax. HEART AND MEDIASTINUM: No acute abnormality of the cardiac and mediastinal silhouettes. BOWEL: The bowel gas pattern is nonspecific. No bowel obstruction. PERITONEUM AND SOFT TISSUES: Status post cholecystectomy. Possible free air is noted along the left  abdominal wall versus air-filled colon. CT scan is recommended for further evaluation. No abnormal calcifications. BONES: No acute osseous abnormality. IMPRESSION: 1. Possible free intraperitoneal air along the left abdominal wall versus air within the colon. 2. CT abdomen and pelvis is recommended for further evaluation. Electronically signed by: Lynwood Seip MD 10/29/2024 04:46 PM EST RP Workstation:  [id number]    Anti-infectives: Anti-infectives (From admission, onward)    Start     Dose/Rate Route Frequency Ordered Stop   10/30/24 0600  piperacillin -tazobactam (ZOSYN ) IVPB 3.375 g        3.375 g 12.5 mL/hr over 240 Minutes Intravenous Every 8 hours 10/29/24 2350 11/04/24 0559   10/29/24 2145  piperacillin -tazobactam (ZOSYN ) IVPB 3.375 g        3.375 g 100 mL/hr over 30 Minutes Intravenous  Once 10/29/24 2137 10/29/24 2205   10/29/24 1700  cefTRIAXone  (ROCEPHIN ) 1 g in sodium chloride  0.9 % 100 mL IVPB  Status:  Discontinued        1 g 200 mL/hr over 30 Minutes Intravenous  Once 10/29/24 1646 10/29/24 1656   10/29/24 1700  metroNIDAZOLE (FLAGYL) IVPB 500 mg        500 mg 100 mL/hr over 60 Minutes Intravenous  Once 10/29/24 1646 10/29/24 1809   10/29/24 1700  cefTRIAXone  (ROCEPHIN ) 2 g in sodium chloride  0.9 % 100 mL IVPB        2 g 200 mL/hr over 30 Minutes Intravenous  Once 10/29/24 1656 10/29/24 1733        Assessment/Plan POD 1, s/p ex lap with LOA, g-tube placement in gastric remnant Dr. Vernetta 11/18 secondary to SBO with possible perforation -cont pressors for septic shock, add steroids today per CCM -cont zosyn , BC 2/4 positive for E coli.  Continue broad spectrum abx for now given how critically ill patient it -cont with open abdomen and plan to return to OR tomorrow -check vitamin levels, B12, B1, A, Co, Fe, Zinc -IV thiamine for 3 days -check mag -replace K today for hypokalemia -cont foley for urinary monitoring -Called brother, Charlie, who is the patient's next of kin.  He gave up his decision making rights to his niece, Harlene.  When I spoke to [place], she stated she was the HCPOA, but unsure where the paperwork is.  Either way, Harlene is the decision maker for the patient.  We discussed plan to return to the OR tomorrow and expected procedure.  She is in agreement.  FEN - strict NPO/IVFs per CCM, check mag, replace K VTE - lovenox  ID - zosyn   E coli  bacteremia - continue zosyn  Septic shock - secondary to all of the above, on 3 pressors.  CCM to add steroids today.  Appreciate their assistance AKI - cr 1.47 today with low UOP.  Cont to monitor.  Check labs in am DM - SSI Leukopenia - secondary to septic shock Pancreatitis - lipase improving to 800s today.  Suspect this is secondary to above.  monitor  I reviewed nursing notes, Consultant CCM notes, last 24 h vitals and pain scores, last 48 h intake and output, last 24 h labs and trends, and last 24 h imaging results.   LOS: 1 day    Burnard FORBES Banter , [facility] Surgery 10/30/2024, 8:50 AM Please see Amion for pager number during day hours 7:00am-4:30pm or 7:00am -11:30am on weekends

## 2024-10-30 NOTE — Progress Notes (Signed)
 Chaplain visited pt Morgan [name]'s room due to high MEWS. No family present. Morgan [name] was unresponsive at the time. RN present who also expressed no needs. Chaplains will continue to follow up and remain available.

## 2024-10-30 NOTE — Progress Notes (Signed)
 Nutrition Follow-up  DOCUMENTATION CODES:   Non-severe (moderate) malnutrition in context of chronic illness  INTERVENTION:  - Will monitor for nutrition plans.   - If able to start tube feeds via G-tube after abdomen closure and bowel function returns, would recommend: Vital 1.5 at 50 ml/h (1200 ml per day) *Would recommend slow advancement of 10mL Q12H (or per CCS) Prosource TF20 60 ml daily Provides 1880 kcal, 101 gm protein, 917 ml free water  daily  - Monitor magnesium , potassium, and phosphorus daily for at least 3 days, MD to replete as needed, as pt is at risk for refeeding syndrome given malnutrition with significant weight loss. - Continue 100mg  thiamine  daily.   - Vitamin/mineral labs being checked by CCS:  - Vitamin B12, B1, Copper , Zinc , Vitamin A   - Daily weights.  -  If not able to within the next 2-3 days, would recommend initiation of TPN if medically appropriate.  NUTRITION DIAGNOSIS:   Moderate Malnutrition related to chronic illness as evidenced by moderate fat depletion, moderate muscle depletion, percent weight loss (25% in 10 months).  GOAL:   Patient will meet greater than or equal to 90% of their needs  MONITOR:   Vent status, Labs, Weight trends  REASON FOR ASSESSMENT:   Ventilator    ASSESSMENT:   58 y.o. female with PMH of Roux-en-Y gastric bypass (2018) who presented with abdominal pain, lactic acidosis and lipase greater than 2800. Admitted for SBO.   11/18 Admit; s/p ex-lap, lysis of adhesion, placement of G-tube, placement of wound VAC; returned to ICU intubated  Patient is currently intubated on ventilator support MV: 12.4 L/min Temp (24hrs), Avg:98.6 F (37 C), Min:97.8 F (36.6 C), Max:100.4 F (38 C)  No family or visitors at bedside at time of visit.  Per EMR, patient weighed at 177# in December 2024 and has since continued to lose to current weight of 132#. This is a 45# or 25% weight loss in 10 months, which is significant  for the time frame.  Patient noted to be admitted with N/V so suspect intake has likely been poor.  Patient is s/p ex-lap and G-tube placement yesterday. She remains with an open abdomen at this time, wound vac in place. Patient to remain intubated due to plan to return to OR tomorrow.  Patient on 3 pressors at this time as well as propofol . G-tube to gravity. No nutrition plans at this time.   Will provide tube feed recs once able to hopefully start enteral nutrition. If not able to within the next 2-3 days, would recommend initiation of TPN if medically appropriate. Suspect patient at high risk for refeeding syndrome.  Of note, surgery checking several vitamin/mineral labs due to history of gastric bypass. Will monitor.    Medications reviewed and include: Protonix , 100mg  thiamine  Epi @ 3 mcg/min Levophed  @ 35 mcg/min Vasopressin  @ 0.04 units/min Propofol  @ 16.22mL/hr (provides 428 kcals over 24 hours)  Labs reviewed:  K+ 3.2 Creatinine 1.47 Magnesium  1.5  Vitamin B12 2693 (H) Thiamine  pending Zinc  pending Copper  pending Vitamin A  pending   NUTRITION - FOCUSED PHYSICAL EXAM:  Flowsheet Row Most Recent Value  Orbital Region Moderate depletion  Upper Arm Region Moderate depletion  Thoracic and Lumbar Region Mild depletion  Buccal Region Unable to assess  Temple Region Severe depletion  Clavicle Bone Region Mild depletion  Clavicle and Acromion Bone Region Moderate depletion  Scapular Bone Region Unable to assess  Dorsal Hand Unable to assess  Patellar Region Mild depletion  Anterior  Thigh Region Mild depletion  Posterior Calf Region Moderate depletion  Edema (RD Assessment) None  Hair Reviewed  Eyes Unable to assess  Mouth Unable to assess  Skin Reviewed  Nails Reviewed    Diet Order:   Diet Order             Diet NPO time specified  Diet effective now                   EDUCATION NEEDS:  Not appropriate for education at this time  Skin:  Skin  Assessment: Skin Integrity Issues: Skin Integrity Issues:: Incisions Incisions: Surgical on abdomen  Last BM:  PTA  Height:  Ht Readings from Last 1 Encounters:  10/29/24 5' 4 (1.626 m)   Weight:  Wt Readings from Last 1 Encounters:  10/29/24 59.9 kg   Ideal Body Weight:  54.55 kg  BMI:  Body mass index is 22.66 kg/m.  Estimated Nutritional Needs:  Kcal:  1800-2000 kcals Protein:  90-110 grams Fluid:  >/= 1.8L    Trude Ned RD, LDN Contact via Secure Chat.

## 2024-10-30 NOTE — Plan of Care (Signed)
  Problem: Education: Goal: Knowledge of General Education information will improve Description: Including pain rating scale, medication(s)/side effects and non-pharmacologic comfort measures Outcome: Progressing   Problem: Health Behavior/Discharge Planning: Goal: Ability to manage health-related needs will improve Outcome: Progressing   Problem: Clinical Measurements: Goal: Will remain free from infection Outcome: Progressing Goal: Respiratory complications will improve Outcome: Progressing Goal: Cardiovascular complication will be avoided Outcome: Progressing   Problem: Coping: Goal: Level of anxiety will decrease Outcome: Progressing   Problem: Elimination: Goal: Will not experience complications related to urinary retention Outcome: Progressing   Problem: Pain Managment: Goal: General experience of comfort will improve and/or be controlled Outcome: Progressing   Problem: Safety: Goal: Ability to remain free from injury will improve Outcome: Progressing

## 2024-10-31 ENCOUNTER — Encounter (HOSPITAL_COMMUNITY): Admission: EM | Disposition: A | Discharge status: Skilled Nursing Facility | Payer: Self-pay | Source: Home / Self Care

## 2024-10-31 ENCOUNTER — Inpatient Hospital Stay (HOSPITAL_COMMUNITY)

## 2024-10-31 ENCOUNTER — Other Ambulatory Visit: Payer: Self-pay

## 2024-10-31 DIAGNOSIS — R6521 Severe sepsis with septic shock: Secondary | ICD-10-CM

## 2024-10-31 DIAGNOSIS — R7881 Bacteremia: Secondary | ICD-10-CM | POA: Diagnosis not present

## 2024-10-31 DIAGNOSIS — A419 Sepsis, unspecified organism: Secondary | ICD-10-CM | POA: Diagnosis not present

## 2024-10-31 DIAGNOSIS — I10 Essential (primary) hypertension: Secondary | ICD-10-CM | POA: Diagnosis not present

## 2024-10-31 DIAGNOSIS — F418 Other specified anxiety disorders: Secondary | ICD-10-CM

## 2024-10-31 DIAGNOSIS — E876 Hypokalemia: Secondary | ICD-10-CM

## 2024-10-31 DIAGNOSIS — K56609 Unspecified intestinal obstruction, unspecified as to partial versus complete obstruction: Secondary | ICD-10-CM | POA: Diagnosis not present

## 2024-10-31 DIAGNOSIS — K660 Peritoneal adhesions (postprocedural) (postinfection): Secondary | ICD-10-CM | POA: Diagnosis not present

## 2024-10-31 DIAGNOSIS — K859 Acute pancreatitis without necrosis or infection, unspecified: Secondary | ICD-10-CM | POA: Diagnosis not present

## 2024-10-31 HISTORY — PX: LAPAROTOMY: SHX154

## 2024-10-31 LAB — BASIC METABOLIC PANEL WITH GFR
Anion gap: 9 (ref 5–15)
Anion gap: 8 (ref 5–15)
BUN: 26 mg/dL — ABNORMAL HIGH (ref 6–20)
BUN: 25 mg/dL — ABNORMAL HIGH (ref 6–20)
CO2: 27 mmol/L (ref 22–32)
CO2: 30 mmol/L (ref 22–32)
Calcium: 6.3 mg/dL — CL (ref 8.9–10.3)
Calcium: 7 mg/dL — ABNORMAL LOW (ref 8.9–10.3)
Chloride: 96 mmol/L — ABNORMAL LOW (ref 98–111)
Chloride: 91 mmol/L — ABNORMAL LOW (ref 98–111)
Creatinine, Ser: 1.12 mg/dL — ABNORMAL HIGH (ref 0.44–1.00)
Creatinine, Ser: 0.99 mg/dL (ref 0.44–1.00)
GFR, Estimated: 57 mL/min — ABNORMAL LOW (ref >60)
GFR, Estimated: >60 mL/min (ref >60)
Glucose, Bld: 136 mg/dL — ABNORMAL HIGH (ref 70–99)
Glucose, Bld: 65 mg/dL — ABNORMAL LOW (ref 70–99)
Potassium: 2.9 mmol/L — ABNORMAL LOW (ref 3.5–5.1)
Potassium: 3.5 mmol/L (ref 3.5–5.1)
Sodium: 133 mmol/L — ABNORMAL LOW (ref 135–145)
Sodium: 128 mmol/L — ABNORMAL LOW (ref 135–145)

## 2024-10-31 LAB — BLOOD GAS, ARTERIAL
Acid-Base Excess: 9.4 mmol/L — ABNORMAL HIGH (ref 0.0–2.0)
Bicarbonate: 32.4 mmol/L — ABNORMAL HIGH (ref 20.0–28.0)
Drawn by: 331471
FIO2: 50 %
MECHVT: 480 mL
O2 Saturation: 98.4 %
PEEP: 5 cmH2O
Patient temperature: 37
RATE: 26 {breaths}/min
pCO2 arterial: 37 mmHg (ref 32–48)
pH, Arterial: 7.55 — ABNORMAL HIGH (ref 7.35–7.45)
pO2, Arterial: 79 mmHg — ABNORMAL LOW (ref 83–108)

## 2024-10-31 LAB — LACTIC ACID, PLASMA
Lactic Acid, Venous: 3 mmol/L (ref 0.5–1.9)
Lactic Acid, Venous: 3.3 mmol/L (ref 0.5–1.9)

## 2024-10-31 LAB — POCT I-STAT 7, (LYTES, BLD GAS, ICA,H+H)
Acid-Base Excess: 6 mmol/L — ABNORMAL HIGH (ref 0.0–2.0)
Bicarbonate: 28.7 mmol/L — ABNORMAL HIGH (ref 20.0–28.0)
Calcium, Ion: 0.93 mmol/L — ABNORMAL LOW (ref 1.15–1.40)
HCT: 35 % — ABNORMAL LOW (ref 36.0–46.0)
Hemoglobin: 11.9 g/dL — ABNORMAL LOW (ref 12.0–15.0)
O2 Saturation: 92 %
Patient temperature: 102.4
Potassium: 2.9 mmol/L — ABNORMAL LOW (ref 3.5–5.1)
Sodium: 130 mmol/L — ABNORMAL LOW (ref 135–145)
TCO2: 30 mmol/L (ref 22–32)
pCO2 arterial: 39 mmHg (ref 32–48)
pH, Arterial: 7.481 — ABNORMAL HIGH (ref 7.35–7.45)
pO2, Arterial: 65 mmHg — ABNORMAL LOW (ref 83–108)

## 2024-10-31 LAB — TRIGLYCERIDES: Triglycerides: 120 mg/dL (ref <150)

## 2024-10-31 LAB — CBC WITH DIFFERENTIAL/PLATELET
Abs Immature Granulocytes: 0.33 K/uL — ABNORMAL HIGH (ref 0.00–0.07)
Basophils Absolute: 0 K/uL (ref 0.0–0.1)
Basophils Relative: 0 %
Eosinophils Absolute: 0.1 K/uL (ref 0.0–0.5)
Eosinophils Relative: 0 %
HCT: 38 % (ref 36.0–46.0)
Hemoglobin: 12.4 g/dL (ref 12.0–15.0)
Immature Granulocytes: 3 %
Lymphocytes Relative: 4 %
Lymphs Abs: 0.4 K/uL — ABNORMAL LOW (ref 0.7–4.0)
MCH: 29 pg (ref 26.0–34.0)
MCHC: 32.6 g/dL (ref 30.0–36.0)
MCV: 88.8 fL (ref 80.0–100.0)
Monocytes Absolute: 0.3 K/uL (ref 0.1–1.0)
Monocytes Relative: 3 %
Neutro Abs: 10.5 K/uL — ABNORMAL HIGH (ref 1.7–7.7)
Neutrophils Relative %: 90 %
Platelets: 45 K/uL — ABNORMAL LOW (ref 150–400)
RBC: 4.28 MIL/uL (ref 3.87–5.11)
RDW: 15.4 % (ref 11.5–15.5)
Smear Review: NORMAL
WBC: 11.6 K/uL — ABNORMAL HIGH (ref 4.0–10.5)
nRBC: 0 % (ref 0.0–0.2)

## 2024-10-31 LAB — DIC (DISSEMINATED INTRAVASCULAR COAGULATION)PANEL
D-Dimer, Quant: 6.22 ug{FEU}/mL — ABNORMAL HIGH (ref 0.00–0.50)
Fibrinogen: 389 mg/dL (ref 210–475)
INR: 1.8 — ABNORMAL HIGH (ref 0.8–1.2)
Platelets: 19 K/uL — CL (ref 150–400)
Prothrombin Time: 21.9 s — ABNORMAL HIGH (ref 11.4–15.2)
Smear Review: NONE SEEN
aPTT: 53 s — ABNORMAL HIGH (ref 24–36)

## 2024-10-31 LAB — GLUCOSE, CAPILLARY
Glucose-Capillary: 101 mg/dL — ABNORMAL HIGH (ref 70–99)
Glucose-Capillary: 133 mg/dL — ABNORMAL HIGH (ref 70–99)
Glucose-Capillary: 134 mg/dL — ABNORMAL HIGH (ref 70–99)
Glucose-Capillary: 135 mg/dL — ABNORMAL HIGH (ref 70–99)
Glucose-Capillary: 147 mg/dL — ABNORMAL HIGH (ref 70–99)
Glucose-Capillary: 58 mg/dL — ABNORMAL LOW (ref 70–99)
Glucose-Capillary: 65 mg/dL — ABNORMAL LOW (ref 70–99)
Glucose-Capillary: 84 mg/dL (ref 70–99)

## 2024-10-31 LAB — CBC
HCT: 35.7 % — ABNORMAL LOW (ref 36.0–46.0)
HCT: 33.3 % — ABNORMAL LOW (ref 36.0–46.0)
Hemoglobin: 12.1 g/dL (ref 12.0–15.0)
Hemoglobin: 11.3 g/dL — ABNORMAL LOW (ref 12.0–15.0)
MCH: 28.8 pg (ref 26.0–34.0)
MCH: 28.9 pg (ref 26.0–34.0)
MCHC: 33.9 g/dL (ref 30.0–36.0)
MCHC: 33.9 g/dL (ref 30.0–36.0)
MCV: 85 fL (ref 80.0–100.0)
MCV: 85.2 fL (ref 80.0–100.0)
Platelets: 29 K/uL — CL (ref 150–400)
Platelets: 37 K/uL — ABNORMAL LOW (ref 150–400)
RBC: 4.2 MIL/uL (ref 3.87–5.11)
RBC: 3.91 MIL/uL (ref 3.87–5.11)
RDW: 14.3 % (ref 11.5–15.5)
RDW: 14.7 % (ref 11.5–15.5)
WBC: 11.6 K/uL — ABNORMAL HIGH (ref 4.0–10.5)
WBC: 12.7 K/uL — ABNORMAL HIGH (ref 4.0–10.5)
nRBC: 0 % (ref 0.0–0.2)
nRBC: 0 % (ref 0.0–0.2)

## 2024-10-31 LAB — TYPE AND SCREEN
ABO/RH(D): AB POS
Antibody Screen: NEGATIVE

## 2024-10-31 LAB — LIPASE, BLOOD: Lipase: 78 U/L — ABNORMAL HIGH (ref 11–51)

## 2024-10-31 LAB — MAGNESIUM: Magnesium: 2 mg/dL (ref 1.7–2.4)

## 2024-10-31 LAB — PHOSPHORUS: Phosphorus: 3.6 mg/dL (ref 2.5–4.6)

## 2024-10-31 MED ORDER — VASOPRESSIN 20 UNIT/ML IV SOLN
INTRAVENOUS | Status: AC
  Filled 2024-10-31: qty 1

## 2024-10-31 MED ORDER — PIPERACILLIN-TAZOBACTAM 3.375 G IVPB
3.3750 g | Freq: Three times a day (TID) | INTRAVENOUS | Status: AC
  Administered 2024-10-31 – 2024-11-07 (×21): 3.375 g via INTRAVENOUS
  Filled 2024-10-31 (×21): qty 50

## 2024-10-31 MED ORDER — CALCIUM GLUCONATE-NACL 1-0.675 GM/50ML-% IV SOLN
1.0000 g | Freq: Once | INTRAVENOUS | Status: AC
  Administered 2024-10-31: 1000 mg via INTRAVENOUS
  Filled 2024-10-31: qty 50

## 2024-10-31 MED ORDER — POTASSIUM CHLORIDE 10 MEQ/100ML IV SOLN
10.0000 meq | INTRAVENOUS | Status: AC
  Administered 2024-10-31: 10 meq via INTRAVENOUS

## 2024-10-31 MED ORDER — 0.9 % SODIUM CHLORIDE (POUR BTL) OPTIME
TOPICAL | Status: DC | PRN
  Administered 2024-10-31: 1000 mL
  Administered 2024-10-31: 2000 mL

## 2024-10-31 MED ORDER — POTASSIUM CHLORIDE 10 MEQ/100ML IV SOLN
10.0000 meq | INTRAVENOUS | Status: AC
  Administered 2024-10-31 (×7): 10 meq via INTRAVENOUS
  Filled 2024-10-31 (×7): qty 100

## 2024-10-31 MED ORDER — INSULIN ASPART 100 UNIT/ML IJ SOLN: 2.0000 [IU] | INTRAMUSCULAR | Status: DC

## 2024-10-31 MED ORDER — SODIUM CHLORIDE 0.9% IV SOLUTION: Freq: Once | INTRAVENOUS | Status: DC

## 2024-10-31 MED ORDER — FENTANYL 2500MCG IN NS 250ML (10MCG/ML) PREMIX INFUSION
0.0000 ug/h | INTRAVENOUS | Status: DC
  Administered 2024-10-31: 25 ug/h via INTRAVENOUS
  Administered 2024-11-02: 100 ug/h via INTRAVENOUS
  Filled 2024-10-31 (×2): qty 250

## 2024-10-31 MED ORDER — DEXTROSE 50 % IV SOLN
INTRAVENOUS | Status: AC
  Administered 2024-10-31: 50 mL via INTRAVENOUS
  Filled 2024-10-31: qty 50

## 2024-10-31 MED ORDER — POTASSIUM CHLORIDE 10 MEQ/100ML IV SOLN
10.0000 meq | INTRAVENOUS | Status: AC
  Administered 2024-10-31 – 2024-11-01 (×4): 10 meq via INTRAVENOUS
  Filled 2024-10-31 (×4): qty 100

## 2024-10-31 MED ORDER — ACETAMINOPHEN 10 MG/ML IV SOLN
1000.0000 mg | Freq: Four times a day (QID) | INTRAVENOUS | Status: AC
  Administered 2024-10-31 – 2024-11-01 (×4): 1000 mg via INTRAVENOUS
  Filled 2024-10-31 (×4): qty 100

## 2024-10-31 MED ORDER — FENTANYL BOLUS VIA INFUSION
25.0000 ug | INTRAVENOUS | Status: DC | PRN
  Administered 2024-10-31 – 2024-11-01 (×4): 50 ug via INTRAVENOUS
  Administered 2024-11-01: 100 ug via INTRAVENOUS
  Administered 2024-11-01: 25 ug via INTRAVENOUS
  Administered 2024-11-01: 100 ug via INTRAVENOUS
  Administered 2024-11-01: 25 ug via INTRAVENOUS
  Administered 2024-11-02: 100 ug via INTRAVENOUS

## 2024-10-31 MED ORDER — DEXTROSE 50 % IV SOLN: 12.5000 g | INTRAVENOUS | Status: AC

## 2024-10-31 MED ORDER — PERFLUTREN LIPID MICROSPHERE
1.0000 mL | INTRAVENOUS | Status: AC | PRN
  Administered 2024-10-31: 2 mL via INTRAVENOUS

## 2024-10-31 MED ORDER — ROCURONIUM BROMIDE 10 MG/ML (PF) SYRINGE
PREFILLED_SYRINGE | INTRAVENOUS | Status: DC | PRN
  Administered 2024-10-31: 60 mg via INTRAVENOUS

## 2024-10-31 MED ORDER — DEXTROSE 50 % IV SOLN
12.5000 g | INTRAVENOUS | Status: AC
  Administered 2024-10-31: 12.5 g via INTRAVENOUS
  Filled 2024-10-31: qty 50

## 2024-10-31 MED ORDER — LACTATED RINGERS IV SOLN: INTRAVENOUS | Status: DC | PRN

## 2024-10-31 MED ORDER — SODIUM CHLORIDE 0.9 % IV SOLN
100.0000 mg | INTRAVENOUS | Status: AC
  Administered 2024-10-31 – 2024-11-04 (×5): 100 mg via INTRAVENOUS
  Filled 2024-10-31 (×5): qty 5

## 2024-10-31 NOTE — Progress Notes (Signed)
 PHARMACY - TOTAL PARENTERAL NUTRITION CONSULT NOTE   Indication: intolerance to enteral feeding  Patient Measurements: Height: 5' 4 (162.6 cm) Weight: 59.9 kg (132 lb) IBW/kg (Calculated) : 54.7 TPN AdjBW (KG): 59.9 Body mass index is 22.66 kg/m.  Assessment: 58 year old female history of gastric bypass presented with abdominal pain found to have small bowel obstruction. With worsening exam, hypotension and rising lactic acid, pt was taken for emergent ex lap with lysis of adhesions, placement of G tube and abthera vac. Transported to CCU with open abdomen, sedated on mechanical ventilation and requiring 3 vasopressors for shock. Return to OR 11/20 for re-exploration - no evidence of ischemia, necrosis or perforation noted; placement of abthera open abdomen wound vac. Some improvement in vasopressor requirements following return from OR.  Pharmacy consulted for TPN management in setting of anticipated prolonged intolerance to enteral feeding.  Glucose / Insulin : hx DM2, no longer on medication following gastric bypass (A1c 5.3) -CBGs 101-286 (currently on stress dose steroids) -Semglee 5 units BID + SSI Electrolytes: K 2.9 (suspect bicarb contributing), Ca 6.3 (corr 7.7), Na 133 Renal: Scr mildly elevated at 1.12 (significant shock), BUN 26; bicarb infusion stopped post OR Hepatic: AST/ALT elevated but improved, albumin  low Intake / Output; MIVF:  -Off IVF, remains on vasopressors -UOP ~ 1300 ml previous 24 hrs -Drains ~ 1600 ml yesterday; ~ 1200 ml this morning GI Imaging: 11/18 CT a/p: findings suspicious for obstruction of the excluded stomach and duodenum, likely secondary to adhesions GI Surgeries / Procedures:  11/18 ex lap, lysis of adhesions, placement of G tube, placement of abthera vac 11/20 re-exploration of abdomen and closure of mesentery defect, placement of abthera open abdomen wound vac  Central access: pending PICC placement TPN start date: anticipated  11/21  Nutritional Goals: Per d/w RD - attempt to target higher end of protein needs Pt on propofol  which will contribute to patient's caloric needs  RD Assessment: Estimated Needs Total Energy Estimated Needs: 1800-2000 kcals Total Protein Estimated Needs: 90-110 grams Total Fluid Estimated Needs: >/= 1.8L  Current Nutrition: NPO  Plan:  -Receiving replacement for potassium and calcium  -Labs have been ordered for tomorrow morning -Reassess propofol  needs in the morning for TPN -Will need PICC placement -Thiamine 100 mg IV extended to 6 days, pt at significant refeeding risk -Pt on Semglee 5 units BID + SSI  Stefano MARLA Bologna, PharmD, BCPS Clinical Pharmacist 10/31/2024 2:46 PM

## 2024-10-31 NOTE — Op Note (Addendum)
 10/29/2024 - 10/31/2024  12:04 PM  PATIENT:  Morgan [name]  58 y.o. female  PRE-OPERATIVE DIAGNOSIS:  SEPTIC SHOCK; open abdomen, h/o exploratory laparotomy, lysis of adhesion, placement of gastrostomy tube, placement of Abthera vac 10/29/24  POST-OPERATIVE DIAGNOSIS:  SEPTIC SHOCK, same  PROCEDURE:  Procedure(s): REEXPLORATION OF ABDOMEN AND CLOSURE OF MESSENTARY DEFECT   Negative pressure wound therapy (vacuum-assisted drainage collection), utilizing nondisposable durable medical equipment (DME) on an open Abdominal wound (ABTHERA wound vac) including topical application, wound assessment, and instructions for ongoing care; total wound surface area greater than 50 cm   SURGEON:  Surgeon(s): Tanda Locus, MD  ASSISTANTS: Burnard Banter, PA-C   ANESTHESIA:   general  EBL: minimal  DRAINS: Urinary Catheter (Foley) and Gastrostomy Tube   LOCAL MEDICATIONS USED:  NONE  SPECIMEN:  No Specimen  DISPOSITION OF SPECIMEN:  N/A  COUNTS:  YES  INDICATION FOR PROCEDURE: Patient is a 58 year old female with a remote history of Roux-en-Y gastric bypass who presented the other day with severe abdominal pain with intractable nausea vomiting.  She was taken emergently to the operating room by my partner and underwent exploratory laparotomy.  She was found to have bilious ascites as well as a dense adhesive band between the gastric remnant over the transverse colon and over the proximal small bowel to the root of the small bowel mesentery.  This was lysed and a gastrotomy tube was placed in her abdomen is left over since she was in septic shock.  The patient remains in florid septic shock on 3 vasopressors.  Because she has refractory shock I was concerned that she may have some type of ischemic bowel as the source of her ongoing sepsis.  Even though her platelet count acutely dropped this morning I had a long conversation with the niece, the CCM physician and anesthesia and we felt that it was  probably the best option to evaluate her abdomen to rule it out as a source of ongoing sepsis.  Patient was typed and screened and platelets were available for transfusion and they actually were started to transfuse when we brought her into the OR.  PROCEDURE: After obtaining informed consent from the patient's niece that she was taken directly from the ICU to [facility] [place] for and placed upon on the operating table.  Her indwelling endotracheal tube was connected to the anesthesia circuit.  She already had sequential compression devices on.  The patient was on scheduled therapeutic broad-spectrum IV antibiotics.  Her abdomen was prepped with Betadine .  The old wound VAC dressing had been removed.  Surgical timeout was performed.  The wound VAC sponge was removed.  We then explored her abdomen.  There was no succus in her abdomen.  There was no bile staining or bilious ascites.  She had some trace purulence in her pelvis only.  I identified the gastric remnant.  I could see her G-tube anchored in the gastric and it to the abdominal wall all that was viable.  Her distal gastric remnant was viable and well-perfused.  Identified her gastric pouch and her Roux limb all that looked normal.  There was no signs of staining or fibrinous exudate around the gastric pouch or gastrojejunal anastomosis suggesting of perforation.  The transverse colon was identified and lifted up.  The biliopancreatic limb as it came out at the ligament of Treitz was identified.  You could see the impression of where the adhesive band had been going down onto the Roux the small bowel mesentery  again all this looked normal.  I identified her jejunojejunostomy that was healthy and viable.  We then ran the common channel all the way down to the terminal ileum.  There was a tongue of omentum adhered to the right lateral abdominal wall and this was taken down with Kellys and cut and tied off with 2-0 silk ties.  There was a small mesenteric defect  near the terminal ileum and this was closed with 2 interrupted 2-0 silk sutures.  There was no evidence of necrotic bowel.  There was no signs of intestinal perforation.  There is no inflammatory rind in the abdomen except for a loop of small bowel that had been sitting in the pelvis where that purulence was.  But there is no evidence of bowel perforation or any type of defect.  The uterus appeared grossly normal.  The descending colon sigmoid colon and upper rectum appeared normal as well.  I irrigated the abdomen.  I reran the bowel to additional times and saw no evidence of perforation or ischemia.  There was obviously a small amount of erythema to a section of the small bowel probably from her being on 3 vasopressors but nothing that looked ischemic or nonviable.  The open abdominal wound VAC ABThera was placed in the abdomen.   A negative pressure wound VAC (Vacuum Assisted drainage Collection device) using nondisposable durable medical equipment (DME) was placed onto this open abdomen with a total wound surface area of (20 x 6 cm)  We then placed the typical plastic sheets over and connected the wound VAC to continuous suction had a good seal.  The patient was taken directly back to the ICU in critical condition.  I updated the niece at the conclusion of the procedure.  All needle, instrument, and sponge counts were correct x 2.  PLAN OF CARE: already inpatient  PATIENT DISPOSITION:  ICU - intubated and critically ill.   Delay start of Pharmacological VTE agent (>24hrs) due to surgical blood loss or risk of bleeding:  no  [name]. Tanda, MD, FACS General, Bariatric, & Minimally Invasive Surgery [facility]

## 2024-10-31 NOTE — Progress Notes (Signed)
 Nutrition Follow-up  DOCUMENTATION CODES:   Non-severe (moderate) malnutrition in context of chronic illness  INTERVENTION:  - Plan to start TPN tomorrow.   - TPN management per pharmacy.  - Monitor magnesium , potassium, and phosphorus daily for at least 3 days, MD to replete as needed, as pt is at risk for refeeding syndrome given malnutrition with significant weight loss. - Continue 100mg  thiamine daily.    - Vitamin/mineral labs being checked by [facility]:             - Vitamin B12 (WNL) - Thiamine, Copper, Zinc, Vitamin A  - pending   - Daily weights.   NUTRITION DIAGNOSIS:   Moderate Malnutrition related to chronic illness as evidenced by moderate fat depletion, moderate muscle depletion, percent weight loss (25% in 10 months). *ongoing  GOAL:   Patient will meet greater than or equal to 90% of their needs *progressing, plan to start TPN  MONITOR:   Vent status, Labs, Weight trends  REASON FOR ASSESSMENT:   Ventilator    ASSESSMENT:   58 y.o. female with PMH of Roux-en-Y gastric bypass (2018) who presented with abdominal pain, lactic acidosis and lipase greater than 2800. Admitted for SBO.  11/18 Admit; s/p ex-lap, lysis of adhesion, placement of G-tube, placement of wound VAC; returned to ICU intubated 11/20 s/p reexploration of abdomen and closure of mesentery defect, placement of wound VAC   Patient is currently intubated on ventilator support MV: 11.5 L/min Temp (24hrs), Avg:101.3 F (38.5 C), Min:99.5 F (37.5 C), Max:104 F (40 C)  Niece at bedside at time of visit. She confirms the patient has lost significant weight over the past few months due to not being able to eat well.  Notes the patient has been eating very small meals over the past 4-5 months. Would only have sausage and 1 egg for breakfast, 1/2 a peanut butter sandwich, and a very small dinner. Did have Premier Protein at home that she was drinking.  Niece notes the patient hates fruits and  vegetables so was more so surviving on processed foods and peanut butter. Niece reports that patient was very good about taking her vitamins. Was taking a bariatric MVI, vitamin D +calcium +vitamin K , and a probiotic at home.   Patient went back to the OR today. [facility] note indicates they found on evidence of necrotic bowel or perforation. Abdomen now closed.  [facility] wanting to start TPN. Dicussed with pharmacy. Plan to start tomorrow as patient unlikely to get PICC placed today.  Patient at very high risk of refeeding syndrome given significant weight loss and inadequate oral intake for several months. Patient already receiving thiamine, this has been extended by pharmacy. Will need to monitor electrolytes closely.     Medications reviewed and include: Protonix , 100mg  thiamine Fentanyl  Epi @ 4 mcg/min Levophed  @ 35 mcg/min Vasopressin @ 0.04 units/min Propofol  @ 17.63mL/hr (provides 474 kcals over 24 hours)   Labs reviewed:  Na 133 K+ 2.9 Creatinine 1.12   Vitamin B12 2693 (H) Thiamine pending Zinc pending Copper pending Vitamin A  pending  Diet Order:   Diet Order             Diet NPO time specified  Diet effective now                   EDUCATION NEEDS:  Not appropriate for education at this time  Skin:  Skin Assessment: Skin Integrity Issues: Skin Integrity Issues:: Incisions Incisions: Surgical on abdomen  Last BM:  PTA  Height:  Ht Readings from Last 1 Encounters:  10/29/24 5' 4 (1.626 m)   Weight:  Wt Readings from Last 1 Encounters:  10/29/24 59.9 kg   Ideal Body Weight:  54.55 kg  BMI:  Body mass index is 22.66 kg/m.  Estimated Nutritional Needs:  Kcal:  1800-2000 kcals Protein:  90-110 grams Fluid:  >/= 1.8L    Trude Ned RD, LDN Contact via Secure Chat.

## 2024-10-31 NOTE — Transfer of Care (Signed)
 Immediate Anesthesia Transfer of Care Note  Patient: Morgan [name]  Procedure(s) Performed: REEXPLORATION OF ABDOMEN AND CLOSURE OF MESSENTARY DEFECT AND PLACEMENT OF THERA-VAC (Abdomen)  Patient Location: ICU  Anesthesia Type:General  Level of Consciousness: sedated and Patient remains intubated per anesthesia plan  Airway & Oxygen  Therapy: Patient remains intubated per anesthesia plan  Post-op Assessment: Report given to RN and Post -op Vital signs reviewed and stable  Post vital signs: Reviewed and stable  Last Vitals:  Vitals Value Taken Time  BP    Temp 38.4 C 10/31/24 12:35  Pulse    Resp 26 10/31/24 12:35  SpO2    Vitals shown include unfiled device data.  Last Pain:  Vitals:   10/31/24 1015  TempSrc: Esophageal  PainSc:          Complications: No notable events documented.

## 2024-10-31 NOTE — Anesthesia Postprocedure Evaluation (Signed)
 Anesthesia Post Note  Patient: Morgan [name]  Procedure(s) Performed: REEXPLORATION OF ABDOMEN AND CLOSURE OF MESSENTARY DEFECT AND PLACEMENT OF THERA-VAC (Abdomen)     Patient location during evaluation: PACU Anesthesia Type: General Level of consciousness: patient remains intubated per anesthesia plan and sedated Pain management: pain level controlled Vital Signs Assessment: vitals unstable Respiratory status: patient on ventilator - see flowsheet for VS and patient remains intubated per anesthesia plan Cardiovascular status: unstable and tachycardic Postop Assessment: no apparent nausea or vomiting Anesthetic complications: no Comments: Patient taken from OR 4 directly to ICU with full monitors and ventilatory support. She requires Vasopressin, norepi and epinephrine  hemodynamic support. Report given to ICU RN and PA.   No notable events documented.               Ysabella Babiarz A.

## 2024-10-31 NOTE — Progress Notes (Addendum)
 2 Days Post-Op  Subjective: Sedated on vent, critically ill on 3 pressors.  A line just replaced.  UOP picked up and 1900 in last 24 hrs.  Febrile to 102.4 currently.  ROS: unable, on vent  Objective: Vital signs in last 24 hours: Temp:  [99.5 F (37.5 C)-101.9 F (38.8 C)] 100.2 F (37.9 C) (11/20 0550) Pulse Rate:  [29-118] 114 (11/20 0700) Resp:  [23-29] 27 (11/20 0715) BP: (66-135)/(41-98) 127/87 (11/20 0715) SpO2:  [92 %-99 %] 98 % (11/20 0752) Arterial Line BP: (69-122)/(52-96) 89/85 (11/20 0630) FiO2 (%):  [40 %-60 %] 50 % (11/20 0752) Last BM Date : 10/28/24  Intake/Output from previous day: 11/19 0701 - 11/20 0700 In: 7174.3 [I.V.:6107; IV Piggyback:1067.3] Out: 3600 [Urine:1950; Drains:1650] Intake/Output this shift: No intake/output data recorded.  PE: Gen: critically ill, sedated on vent Neck: almost looks like a burn on the left anterior side of her neck with top skin layer that has separated from inferior aspect.  No lines, dressing near this.  CL in place on right side of neck  Heart: regular, tachy Lungs: on vent Abd: soft, ABthera in place with serosang output, g-tube with thin brown output GU: foley in place with yellow urine output  Lab Results:  Recent Labs    10/30/24 0220 10/31/24 0446  WBC 2.0* 11.6*  HGB 11.3* 12.1  HCT 35.0* 35.7*  PLT 323 29*   BMET Recent Labs    10/30/24 0220 10/31/24 0446  NA 137 133*  K 3.2* 2.9*  CL 108 96*  CO2 19* 27  GLUCOSE 241* 136*  BUN 27* 26*  CREATININE 1.47* 1.12*  CALCIUM  7.5* 6.3*   PT/INR Recent Labs    10/29/24 1710  LABPROT 13.3  INR 1.0   CMP     Component Value Date/Time   NA 133 (L) 10/31/2024 0446   NA 142 09/23/2024 1030   K 2.9 (L) 10/31/2024 0446   CL 96 (L) 10/31/2024 0446   CO2 27 10/31/2024 0446   GLUCOSE 136 (H) 10/31/2024 0446   BUN 26 (H) 10/31/2024 0446   BUN 17 09/23/2024 1030   CREATININE 1.12 (H) 10/31/2024 0446   CREATININE 0.82 06/18/2013 1009    CALCIUM  6.3 (LL) 10/31/2024 0446   PROT 3.4 (L) 10/30/2024 1141   PROT 6.5 06/28/2024 0842   ALBUMIN  2.2 (L) 10/30/2024 1141   ALBUMIN  4.5 06/28/2024 0842   AST 123 (H) 10/30/2024 1141   ALT 130 (H) 10/30/2024 1141   ALKPHOS 48 10/30/2024 1141   BILITOT <0.2 10/30/2024 1141   BILITOT 0.3 06/28/2024 0842   GFRNONAA 57 (L) 10/31/2024 0446   GFRNONAA 87 06/18/2013 1009   GFRAA >60 08/04/2020 1038   GFRAA >89 06/18/2013 1009   Lipase     Component Value Date/Time   LIPASE 78 (H) 10/31/2024 0446       Studies/Results: DG CHEST PORT 1 VIEW Result Date: 10/30/2024 EXAM: 1 VIEW(S) XRAY OF THE CHEST 10/30/2024 12:22:00 AM COMPARISON: 10/29/2024 CLINICAL HISTORY: Intubation of airway performed without difficulty. FINDINGS: LINES, TUBES AND DEVICES: Endotracheal tube in place with tip 4 cm above the carina. Right IJ central venous catheter in place with tip overlying the cavoatrial junction region. LUNGS AND PLEURA: Low lung volume. Hazy opacities in left lower lung zone, likely representing atelectasis and/or pneumonia. Small left pleural effusion. No pneumothorax. HEART AND MEDIASTINUM: No acute abnormality of the cardiac and mediastinal silhouettes. BONES AND SOFT TISSUES: Left shoulder arthroplasty noted. Surgical clips in right upper  quadrant, consistent with prior cholecystectomy. IMPRESSION: 1. Hazy opacities in the left lower lung zone, likely representing atelectasis and/or pneumonia. 2. Small left pleural effusion. Electronically signed by: Morgane Naveau MD 10/30/2024 12:32 AM EST RP Workstation: [id number]   CT ABDOMEN PELVIS W CONTRAST Result Date: 10/29/2024 CLINICAL DATA:  Mid abdominal pain, nausea, and vomiting EXAM: CT ABDOMEN AND PELVIS WITH CONTRAST TECHNIQUE: Multidetector CT imaging of the abdomen and pelvis was performed using the standard protocol following bolus administration of intravenous contrast. RADIATION DOSE REDUCTION: This exam was performed according to the  departmental dose-optimization program which includes automated exposure control, adjustment of the mA and/or kV according to patient size and/or use of iterative reconstruction technique. CONTRAST:  OMNIPAQUE  IOHEXOL  300 MG/ML  SOLN COMPARISON:  CT abdomen and pelvis dated 03/19/2019 FINDINGS: Lower chest: Bilateral lower lobe subsegmental atelectasis. Trace bilateral pleural effusions. Partially imaged heart size is normal. Hepatobiliary: Atrophic left hepatic lobe. No intra or extrahepatic biliary ductal dilation. Cholecystectomy. Pancreas: No focal lesions or main ductal dilation. Spleen: 9 mm hypodensity in the spleen (2:29), likely benign cyst. Adrenals/Urinary Tract: No adrenal nodules. No suspicious renal mass, calculi or hydronephrosis. 10 mm minimally complicated cyst in the anterior interpolar left kidney (11:12). No focal bladder wall thickening. Stomach/Bowel: Postsurgical changes of Roux-en-Y gastric bypass. Marked dilation of the excluded stomach and duodenum to the level of the duodenojejunal junction, where there is abrupt luminal caliber transition (9:118). There is also apparent tethering of the gastric body at this level (7:50) as well as marked luminal narrowing of the transverse colon (2:42). Mural thickening and enhancement of the additional small bowel loops throughout the abdomen, many of which are underdistended, predominantly in the left lower quadrant. Distal transverse, descending, and rectosigmoid colon are underdistended. Appendix is not discretely seen. Vascular/Lymphatic: Aortic atherosclerosis. No enlarged abdominal or pelvic lymph nodes. Reproductive: Subtly delineated right adnexal cystic structure measures 3.4 cm (2:74). No left adnexal mass. Other: Moderate volume ascites.  No free air. Musculoskeletal: No acute or abnormal lytic or blastic osseous lesions. Postsurgical changes of L3-5 spinal fusion. Hardware appears intact. Multilevel degenerative changes of the partially  imaged thoracic and lumbar spine. IMPRESSION: 1. Marked dilation of the excluded stomach and duodenum to the level of the duodenojejunal junction, where there is abrupt luminal caliber transition associated with apparent tethering of the gastric body as well as marked luminal narrowing of the transverse colon. Findings are suspicious for obstruction of the excluded stomach and duodenum, likely secondary to adhesions. Of note, an adhesion was demonstrated causing obstruction in this area when the patient last presented with similar CT appearance on 03/19/2019. Recommend surgical consultation. 2. Mural thickening and enhancement of the additional small bowel loops throughout the abdomen, predominantly in the left lower quadrant, which may be reactive or reflect enteritis. 3. Moderate volume ascites. 4. Subtly delineated right adnexal cystic structure measures 3.4 cm. Recommend nonemergent pelvic ultrasound for further evaluation. 5. Trace bilateral pleural effusions. 6.  Aortic Atherosclerosis (ICD10-I70.0). Electronically Signed   By: Limin  Xu M.D.   On: 10/29/2024 19:17   DG Abdomen Acute W/Chest Result Date: 10/29/2024 EXAM: UPRIGHT AND SUPINE XRAY VIEWS OF THE ABDOMEN AND 4 VIEW(S) OF THE CHEST 10/29/2024 04:31:00 PM COMPARISON: 07/21/2018 CLINICAL HISTORY: vomiting with epigastric abdominal pain FINDINGS: LUNGS AND PLEURA: No consolidation or pulmonary edema. No pleural effusion or pneumothorax. HEART AND MEDIASTINUM: No acute abnormality of the cardiac and mediastinal silhouettes. BOWEL: The bowel gas pattern is nonspecific. No bowel obstruction. PERITONEUM AND SOFT  TISSUES: Status post cholecystectomy. Possible free air is noted along the left abdominal wall versus air-filled colon. CT scan is recommended for further evaluation. No abnormal calcifications. BONES: No acute osseous abnormality. IMPRESSION: 1. Possible free intraperitoneal air along the left abdominal wall versus air within the colon. 2. CT  abdomen and pelvis is recommended for further evaluation. Electronically signed by: Lynwood Seip MD 10/29/2024 04:46 PM EST RP Workstation: [id number]    Anti-infectives: Anti-infectives (From admission, onward)    Start     Dose/Rate Route Frequency Ordered Stop   10/31/24 1400  piperacillin -tazobactam (ZOSYN ) IVPB 3.375 g        3.375 g 12.5 mL/hr over 240 Minutes Intravenous Every 8 hours 10/31/24 0751     10/30/24 0600  piperacillin -tazobactam (ZOSYN ) IVPB 3.375 g  Status:  Discontinued        3.375 g 12.5 mL/hr over 240 Minutes Intravenous Every 8 hours 10/29/24 2350 10/31/24 0751   10/29/24 2145  piperacillin -tazobactam (ZOSYN ) IVPB 3.375 g        3.375 g 100 mL/hr over 30 Minutes Intravenous  Once 10/29/24 2137 10/29/24 2205   10/29/24 1700  cefTRIAXone  (ROCEPHIN ) 1 g in sodium chloride  0.9 % 100 mL IVPB  Status:  Discontinued        1 g 200 mL/hr over 30 Minutes Intravenous  Once 10/29/24 1646 10/29/24 1656   10/29/24 1700  metroNIDAZOLE (FLAGYL) IVPB 500 mg        500 mg 100 mL/hr over 60 Minutes Intravenous  Once 10/29/24 1646 10/29/24 1809   10/29/24 1700  cefTRIAXone  (ROCEPHIN ) 2 g in sodium chloride  0.9 % 100 mL IVPB        2 g 200 mL/hr over 30 Minutes Intravenous  Once 10/29/24 1656 10/29/24 1733        Assessment/Plan POD 2, s/p ex lap with LOA, g-tube placement in gastric remnant Dr. Vernetta 11/18 secondary to SBO with possible perforation -cont pressors and steroids for septic shock per CCM -cont zosyn , BC 2/4 positive for E coli.  Continue broad spectrum abx for now given how critically ill patient it.  Could consider adding antifungal given foregut etiology  -return to OR today -check vitamin levels, B12, B1, A, Co, Fe, Zinc -IV thiamine for 3 days -mag improved today -hypokalemia persists, replacing again today -cont foley for urinary monitoring -will need picc and TNA given unlikely to use gut for nutrition for a while -plts came back on cbc this am at  20 from 323 yesterday.  Repeat cbc now to see if this is accurate and if so, we will need to give plts prior to/in OR  FEN - strict NPO/IVFs per CCM, electrolytes being replaced, TNA VTE - lovenox  ID - zosyn , add antifungal coverage today  E coli bacteremia - continue zosyn  Septic shock - secondary to all of the above, on 3 pressors and steroids.  Appreciate CCM assistance AKI - cr 1.12 today with much better UOP DM - SSI Leukopenia - secondary to septic shock, improved to 11K today Pancreatitis - lipase 78 today, suspect this is reactive Thrombocytopenia - see above, repeat CBC pending, DIC panel Hypocalcemia - per CCM, ionized calcium  pending PCM - picc/TNA Neck wound - unclear etiology, have asked nursing to place mepilex over this to protect it.  Avoid dressings or lines in this area.  I reviewed nursing notes, Consultant CCM notes, last 24 h vitals and pain scores, last 48 h intake and output, last 24 h labs and trends, and  last 24 h imaging results.   LOS: 2 days    Burnard FORBES Banter , [facility] 10/31/2024, 8:30 AM Please see Amion for pager number during day hours 7:00am-4:30pm or 7:00am -11:30am on weekends

## 2024-10-31 NOTE — Progress Notes (Signed)
 Attending note: I have seen and examined the patient. History, labs and imaging reviewed.  58 year old with history of Rou en Y presenting with small bowel obstruction.  Underwent exploratory laparotomy yesterday Remains on the ventilator with open abdomen On pressors and bicarb  Blood pressure 127/87, pulse (!) 114, temperature (!) 102.4 F (39.1 C), temperature source Oral, resp. rate (!) 27, height 5' 4 (1.626 m), weight 59.9 kg, last menstrual period 10/17/2017, SpO2 98%. Chronically ill-appearing Sedated on the ventilator, unresponsiveness Lungs are clear to auscultation Abdominal wound VAC dressing   Labs/Imaging personally reviewed, significant for WBC upto 11.6, plts down to 29  Assessment/plan: Small small bowel obstruction status post exploratory laparotomy Septic shock secondary to E. coli bacteremia Acute hypoxic respiratory failure, multiorgan failure  Remains on the vent with open abdomen Continue Zosyn  for now.  Add micafungin  coverage as she remains in shock Stress dose steroids, wean down pressors as tolerated Place PICC line, replace A-line Discussed with surgery.  Will go back to the OR for another look today  Family updated at bedside  The patient is critically ill with multiple organ systems failure and requires high complexity decision making for assessment and support, frequent evaluation and titration of therapies, application of advanced monitoring technologies and extensive interpretation of multiple databases.  Critical care time - 35 mins. This represents my time independent of the NPs time taking care of the pt.  Tashala Cumbo MD [facility] 10/31/2024, 8:41 AM

## 2024-10-31 NOTE — Progress Notes (Signed)
 Platelets retrieved from the blood bank, signed off with this RN & Cathryne Sharps RN. Hold platelets for OR per Camellia Blush MD. Platelets returned to blood bank.

## 2024-10-31 NOTE — Progress Notes (Signed)
 eLink Physician-Brief Progress Note Patient Name: Morgan [name] DOB: 04-06-1966 MRN: [id number]   Date of Service  10/31/2024  HPI/Events of Note  Platelet count down to 29 K from 323 on prior draw without obvious explanation.  eICU Interventions  Will recheck CBC to verify result.        Citlalli Weikel U Jhanvi Drakeford 10/31/2024, 6:16 AM

## 2024-10-31 NOTE — Progress Notes (Signed)
 RN flushed arterial line and attempted to draw blood for new CBC per order. No blood return from art line. Respiratory therapist contacted with no response. RN re-attempted in addition the the Charge RN attempting to flush and draw blood from line. Attempt was unsuccessful. Elink RN made aware

## 2024-10-31 NOTE — Progress Notes (Signed)
*  PRELIMINARY RESULTS* Echocardiogram 2D Echocardiogram attempted. Patient going to the OR.   Morgan [name] 10/31/2024, 10:24 AM

## 2024-10-31 NOTE — Progress Notes (Signed)
  Echocardiogram 2D Echocardiogram was attempted.  Patient went to OR.  Will attempt at another time.  Jennene Downie 10/31/2024, 11:59 AM

## 2024-10-31 NOTE — Progress Notes (Signed)
 eLink Physician-Brief Progress Note Patient Name: Morgan [name] DOB: December 23, 1965 MRN: [id number]   Date of Service  10/31/2024  HPI/Events of Note  K+ 2.9, Calcium  6.3.  eICU Interventions  Electrolytes replaced per protocol.        Joeseph Verville U Johni Narine 10/31/2024, 6:04 AM

## 2024-10-31 NOTE — Progress Notes (Signed)
 Delanna Metro, RN made aware that PICC will be placed tomorrow.  Patient currently has adequate IV access for the prescribed therapies.

## 2024-10-31 NOTE — Progress Notes (Signed)
 Hypoglycemic Event  CBG: 65  Treatment: D50 25 mL (12.5 gm)  Symptoms: None, pt intubated and sedated  Follow-up CBG: Time:2111 CBG Result:133  Possible Reasons for Event: Inadequate meal intake  Comments/MD notified:Shelby Dasie, MD notified     Delanna LOISE Metro

## 2024-10-31 NOTE — Progress Notes (Signed)
 NAME:  Morgan [name], MRN:  [id number], DOB:  Sep 29, 1966, LOS: 2 ADMISSION DATE:  10/29/2024, CONSULTATION DATE:  10/30/2024 REFERRING MD:  Dr. Vernetta CHIEF COMPLAINT:  Small bowel Obstruction   Brief HPI  Briefly, 58 year old female with history of Roux-en-Y gastric bypass who presented to the emergency department yesterday evening with abdominal pain, lactic acidosis and lipase greater than 2800.  She had CCS evaluation and went for exploratory laparotomy.  Found to have dense adhesive band that was obstructing her proximal small bowel as well as biliary leak without evidence of perforation.  She had a gastrostomy tube placed in the left open with a wound VAC.  She is in continuity.  Also in septic shock, she returned from OR to ICU on Levophed , vasopressin , intubated on bicarb drip.  Pertinent  Medical History   Past Medical History:  Diagnosis Date   Allergic rhinitis    Anemia     after gastric bypass in 2018   Anxiety    Barrett's esophagus    Bipolar affective disorder (HCC)    CAP (community acquired pneumonia) 07/20/2018   Chronic respiratory failure (HCC)    Constipation, chronic    Degenerative joint disease of spine    Depression    Diabetes mellitus without complication (HCC)    type 2    DM (diabetes mellitus) (HCC)    hypoglycemic per pt - no longer takes DM meds due to weight loss from gastric bypass in 2018   Dry eye    Family history of adverse reaction to anesthesia    nausea and vomiting   Gastroparesis    GERD (gastroesophageal reflux disease)    H/O shoulder replacement 08/11/2020   left shoulder   History of bariatric surgery 03/2017   History of hiatal hernia    HLD (hyperlipidemia) 03/12/2013   Hyperlipidemia    Hypertension    No HTN meds since weight loss from Gastric Bypass in 2018   Intractable chronic migraine without aura 06/04/2015   Migraine headache    Morbid obesity (HCC)    OSA (obstructive sleep apnea)    No cpap since gastric  surgery   Osteoarthritis    bilateral knee   SBO (small bowel obstruction) (HCC) 03/19/2019   Sleep apnea    Unspecified hypothyroidism    Vitamin B 12 deficiency    Vitamin D  deficiency      Significant Hospital Events: Including procedures, antibiotic start and stop dates in addition to other pertinent events   11/18  Interim History / Subjective:  Remains on vaso 0.04, NE 40, epi 3, propofol  35, bicarb gtt Aline not working overnight  Objective    Blood pressure 127/87, pulse (!) 114, temperature 100.2 F (37.9 C), temperature source Axillary, resp. rate (!) 27, height 5' 4 (1.626 m), weight 59.9 kg, last menstrual period 10/17/2017, SpO2 95%.    Vent Mode: PRVC FiO2 (%):  [40 %-60 %] 60 % Set Rate:  [26 bmp] 26 bmp Vt Set:  [480 mL] 480 mL PEEP:  [5 cmH20] 5 cmH20 Plateau Pressure:  [15 cmH20-16 cmH20] 16 cmH20   Intake/Output Summary (Last 24 hours) at 10/31/2024 0732 Last data filed at 10/31/2024 9341 Gross per 24 hour  Intake 6572.96 ml  Output 3600 ml  Net 2972.96 ml   Filed Weights   10/29/24 1434  Weight: 59.9 kg    Examination: Propofol  35 General:  critically ill adult female sedated on MV, NAD HEENT: MM pink/dry, ETT 7 at 19, pupils 4/r,  has small ?blister to anterior neck  Neuro: sedated, RASS -4 CV: rr, tachy 119, distant, R internal jugular CVL, non functioning left radial, +pt PULM:  MV supported, occ breathing over set rate, clear, plat 16, dp 11 GI: soft, bs not appreciated, Gtube to straight drain, light brown, midline incision with wound VAC w/ light serosang  Extremities: hand/ feet cool otherwise warm/dry, no LE edema,  Skin: no rashes  Tmax 101.9 UOP 1.9L/ 24hrs Gtube 250 ml/ 24hrs  Wound VAC 731ml/ 24hr +3.5L  Labs> Na 133, K 2.9, bicarb 27, BUN/ sCr 26/ 1.12, calcium  3.6, mag 2, phos 3.6, lipase 78, triglycerides 120, WBC 2> 11.6, H/H 12.1/ 35, plts 323> ?29  Resolved problem list   Assessment and Plan  Small bowel obstruction  status post exploratory laparotomy with lysis of adhesions Status post gastrostomy tube and wound VAC Septic shock  Lactic acidosis E. Coli bacteremia  Pancreatitis Elevated LFTs likely shock liver Acute blood loss anemia Leukopenia Moderate PCM P:  - per CCS - plans for back to OR pending repeat CBC to verify thrombocytopenia/ if coags needs be optimized prior to surgery.  T&S also ordered  - cont NE, vaso and Epi for MAP goal > 65 - aline replaced - cont stress dose steroids  - cont bicarb gtt - coox reassuring 11/19, echo ordered  - recheck lactic  - cont zosyn , add antifungal  - monitor gastrostomy to straight drain and wound vac output - trend CBC - will need PICC line for more access and TPN when ok with CCS - cont thiamine, B12 2693, ferritin 81.   Vit A,cooper, zinc, and B1 pending  Acute hypoxic respiratory failure postoperatively - cont full LTVV support, 4-8cc/kg IBW with goal Pplat <30 and DP<15  - VAP prevention protocol/ PPI - PAD protocol for sedation> propofol / prn fentanyl  for RASS goal -4/-5  - intermittent CXR/ ABG - wean FiO2 as able for SpO2 >92% - not a candidate for SAT & SBT with open abd - aggressive pulmonary hygiene - prn albuterol   Type 2 diabetes mellitus Hyperglycemia - improving - cont CBG q 4/ prn  - prn rSSI, stop novolog  q4hrs, and semglee 5u BID  Acute kidney injury Metabolic acidosis Hypokalemia Hypocalcemia  - improving sCr and good UOP, cont to monitor closely - cont foley - cont bicarb gtt as above - recheck BMET today after KCL replete - s/p calcium  replete> check iCa - trend renal indices  - strict I/Os, daily wts - avoid nephrotoxins, renal dose meds, hemodynamic support as above   Full code On Lovenox / SCDs  Labs   CBC: Recent Labs  Lab 10/29/24 1541 10/29/24 2131 10/30/24 0220 10/31/24 0446  WBC 1.1*  --  2.0* 11.6*  NEUTROABS 1.0*  --   --   --   HGB 13.0 11.2* 11.3* 12.1  HCT 42.4 33.0* 35.0* 35.7*   MCV 94.4  --  91.6 85.0  PLT 362  --  323 29*    Basic Metabolic Panel: Recent Labs  Lab 10/29/24 1541 10/29/24 2131 10/30/24 0220 10/30/24 0921 10/31/24 0446 10/31/24 0614  NA 142 141 137  --  133*  --   K 3.3* 3.5 3.2*  --  2.9*  --   CL 108  --  108  --  96*  --   CO2 21*  --  19*  --  27  --   GLUCOSE 141*  --  241*  --  136*  --  BUN 25*  --  27*  --  26*  --   CREATININE 1.12*  --  1.47*  --  1.12*  --   CALCIUM  7.9*  --  7.5*  --  6.3*  --   MG 1.7  --   --  1.5* 2.0  --   PHOS  --   --   --   --   --  3.6   GFR: Estimated Creatinine Clearance: 47.9 mL/min (A) (by C-G formula based on SCr of 1.12 mg/dL (H)). Recent Labs  Lab 10/29/24 1541 10/29/24 1714 10/30/24 0031 10/30/24 0220 10/31/24 0446  WBC 1.1*  --   --  2.0* 11.6*  LATICACIDVEN  --  5.1* 3.3*  --   --     Liver Function Tests: Recent Labs  Lab 10/29/24 1541 10/30/24 1141  AST 383* 123*  ALT 272* 130*  ALKPHOS 127* 48  BILITOT 0.3 <0.2  PROT 5.6* 3.4*  ALBUMIN  3.4* 2.2*   Recent Labs  Lab 10/29/24 1541 10/30/24 0220 10/31/24 0446  LIPASE >2,800* 882* 78*   No results for input(s): AMMONIA in the last 168 hours.  ABG    Component Value Date/Time   PHART 7.34 (L) 10/30/2024 1055   PCO2ART 34 10/30/2024 1055   PO2ART 87 10/30/2024 1055   HCO3 18.5 (L) 10/30/2024 1055   TCO2 19 (L) 10/29/2024 2131   ACIDBASEDEF 6.7 (H) 10/30/2024 1055   O2SAT 70.8 10/30/2024 1338     Coagulation Profile: Recent Labs  Lab 10/29/24 1710  INR 1.0    Cardiac Enzymes: No results for input(s): CKTOTAL, CKMB, CKMBINDEX, TROPONINI in the last 168 hours.  HbA1C: HB A1C (BAYER DCA - WAIVED)  Date/Time Value Ref Range Status  06/28/2024 08:40 AM 5.1 4.8 - 5.6 % Final    Comment:             Prediabetes: 5.7 - 6.4          Diabetes: >6.4          Glycemic control for adults with diabetes: <7.0   11/29/2023 08:24 AM 5.8 (H) 4.8 - 5.6 % Final    Comment:             Prediabetes: 5.7  - 6.4          Diabetes: >6.4          Glycemic control for adults with diabetes: <7.0    Hgb A1c MFr Bld  Date/Time Value Ref Range Status  10/30/2024 02:19 AM 5.3 4.8 - 5.6 % Final    Comment:    (NOTE) Diagnosis of Diabetes The following HbA1c ranges recommended by the American Diabetes Association (ADA) may be used as an aid in the diagnosis of diabetes mellitus.  Hemoglobin             Suggested A1C NGSP%              Diagnosis  <5.7                   Non Diabetic  5.7-6.4                Pre-Diabetic  >6.4                   Diabetic  <7.0                   Glycemic control for  adults with diabetes.      CBG: Recent Labs  Lab 10/30/24 1129 10/30/24 1528 10/30/24 1940 10/30/24 2324 10/31/24 0427  GLUCAP 286* 231* 194* 156* 147*    Allergies Allergies  Allergen Reactions   Ketoconazole Hives, Swelling and Other (See Comments)    SWELLING REACTION UNSPECIFIED    Pravachol  [Pravastatin  Sodium] Shortness Of Breath, Swelling and Anaphylaxis    Throat swelling   Statins Other (See Comments)    Leg cramps   Tape Dermatitis and Other (See Comments)    Steri-Strips   Repatha [Evolocumab] Other (See Comments)    Made the stomach hurt badly.   Codeine Other (See Comments) and Hypertension    Increased B/P     Home Medications  Prior to Admission medications   Medication Sig Start Date End Date Taking? Authorizing Provider  Blood Pressure Monitoring KIT Check blood pressure daily 10/03/24   Swinyer, Rosaline HERO, NP  Calcium -Vitamin D -Vitamin K  650-12.5-40 MG-MCG-MCG CHEW Chew 1 each by mouth 2 (two) times daily.    [provider]  cyclobenzaprine  (FLEXERIL ) 10 MG tablet Take 1 tablet (10 mg total) by mouth 3 (three) times daily as needed for muscle spasms. 06/28/24   Jolinda Potter M, DO  desloratadine  (CLARINEX ) 5 MG tablet TAKE ONE TABLET DAILY 05/13/24   Jolinda Potter M, DO  dicyclomine  (BENTYL ) 20 MG tablet TAKE 1  TABLET EVERY 8 HOURS AS NEEDED FOR ABDOMINAL CRAMPING 12/29/23   Jolinda Potter M, DO  Erenumab -aooe 140 MG/ML SOAJ Inject 140 mg into the skin every 30 (thirty) days. 06/24/24   Lomax, Amy, NP  Evolocumab (REPATHA SURECLICK) 140 MG/ML SOAJ Inject 140 mg into the skin every 14 (fourteen) days. 09/23/24   Swinyer, Rosaline HERO, NP  ezetimibe  (ZETIA ) 10 MG tablet TAKE ONE TABLET DAILY 08/13/24   Jolinda Potter M, DO  gabapentin  (NEURONTIN ) 300 MG capsule Takes 2 capsules in morning, one cap at lunch, 2 caps at bedtime 06/03/24   Lomax, Amy, NP  Glucagon , rDNA, (GLUCAGON  EMERGENCY IJ) Inject 1 Syringe as directed daily as needed (emergency low blood sugar).     [provider]  glucose blood (ONETOUCH ULTRA) test strip Check BS four times a day Dx E11.9 09/11/23   Jolinda Potter M, DO  levothyroxine  (SYNTHROID ) 112 MCG tablet TAKE ONE TABLET ONCE DAILY BEFORE BREAKFAST 07/02/24   Jolinda Potter M, DO  linaclotide  (LINZESS ) 290 MCG CAPS capsule Take 1 capsule (290 mcg total) by mouth daily before breakfast. 07/02/24   Jolinda Potter M, DO  loratadine  (CLARITIN ) 10 MG tablet Take 10 mg by mouth daily as needed for allergies.    [provider]  metoprolol tartrate (LOPRESSOR) 50 MG tablet Take 1 tablet (50 mg total) by mouth once for 1 dose. Take 90-120 minutes prior to scan. Hold for SBP less than 110. 09/23/24 09/23/24  Swinyer, Rosaline HERO, NP  mometasone  (NASONEX ) 50 MCG/ACT nasal spray USE 2 SPRAYS IN EACH NOSTRIL ONCE DAILY (REPLACES FLONASE ) 07/30/24   Jolinda Potter M, DO  Multiple Vitamins-Minerals (BARIATRIC MULTIVITAMINS/IRON  PO) Take 1 tablet by mouth daily.    [provider]  MYRBETRIQ  25 MG TB24 tablet TAKE ONE TABLET DAILY 08/13/24   Jolinda Potter M, DO  nystatin  (MYCOSTATIN /NYSTOP ) powder APPLY TO AFFECTED AREA OF GROIN RASH TWICE A DAY FOR 7 TO 10 DAYS PER FLARE 11/29/23   Jolinda Potter M, DO  pantoprazole  (PROTONIX ) 40 MG tablet TAKE ONE TABLET  DAILY 07/22/24   Jolinda Potter M, DO  polyethylene glycol (  MIRALAX  / GLYCOLAX ) packet Take 17 g by mouth at bedtime.    [provider]  Probiotic Product (PROBIOTIC DAILY PO) Take 1 capsule by mouth daily.    [provider]  sertraline  (ZOLOFT ) 100 MG tablet TAKE TWO TABLETS DAILY AS DIRECTED 07/02/24   Jolinda Potter M, DO  Simethicone  (GAS-X EXTRA STRENGTH) 125 MG CAPS Take 125 mg by mouth in the morning, at noon, and at bedtime.    [provider]  SUMAtriptan  (IMITREX ) 100 MG tablet Take 1 tablet (100 mg total) by mouth once as needed for up to 1 dose for migraine. May repeat in 2 hours if headache persists or recurs. 06/03/24   Lomax, Amy, NP  ziprasidone  (GEODON ) 60 MG capsule TAKE ONE CAPSULE AT BEDTIME 08/13/24   Jolinda Potter HERO, DO          CRITICAL CARE Performed by: Lyle Pesa   Total critical care time: 45 minutes  Critical care time was exclusive of separately billable procedures and treating other patients.  Critical care was necessary to treat or prevent imminent or life-threatening deterioration.  Critical care was time spent personally by me on the following activities: development of treatment plan with patient and/or surrogate as well as nursing, discussions with consultants, evaluation of patient's response to treatment, examination of patient, obtaining history from patient or surrogate, ordering and performing treatments and interventions, ordering and review of laboratory studies, ordering and review of radiographic studies, pulse oximetry and re-evaluation of patient's condition.    Lyle Pesa, NP [facility] 10/31/2024, 7:32 AM  See Amion for pager If no response to pager , please call [phone] until 7pm After 7:00 pm call Elink  [phone]

## 2024-10-31 NOTE — Procedures (Signed)
 Arterial Catheter Insertion Procedure Note  Morgan [name]  [id number]  14-May-1966  Date:10/31/24  Time:8:27 AM    Provider Performing: Lyle Pesa    Procedure: Insertion of Arterial Line (63379) with US  guidance (23062)   Indication(s) Blood pressure monitoring and/or need for frequent ABGs  Consent Risks of the procedure as well as the alternatives and risks of each were explained to the patient and/or caregiver.  Consent for the procedure was obtained and is signed in the bedside chart  Anesthesia None, topical lidocaine  1%   Time Out Verified patient identification, verified procedure, site/side was marked, verified correct patient position, special equipment/implants available, medications/allergies/relevant history reviewed, required imaging and test results available.   Sterile Technique Maximal sterile technique including full sterile barrier drape, hand hygiene, sterile gown, sterile gloves, mask, hair covering, sterile ultrasound probe cover (if used).   Procedure Description Area of catheter insertion was cleaned with chlorhexidine  and draped in sterile fashion. With real-time ultrasound guidance an arterial catheter was placed into the left axillary artery.  Appropriate arterial tracings confirmed on monitor.  Sutured with biopatch and sterile dressing applied.    Complications/Tolerance None; patient tolerated the procedure well.   EBL Minimal   Specimen(s) None

## 2024-10-31 NOTE — Progress Notes (Signed)
 eLink Physician-Brief Progress Note Patient Name: NEVENA ROZENBERG DOB: [birth year] MRN: [id number]   Date of Service  10/31/2024  HPI/Events of Note  potassium 3.5, sodium 128, cret 0.99, GFR >60  eICU Interventions  Kcl     Intervention Category Minor Interventions: Electrolytes abnormality - evaluation and management  Fotios Amos 10/31/2024, 8:10 PM

## 2024-10-31 NOTE — Anesthesia Preprocedure Evaluation (Addendum)
 Anesthesia Evaluation  Patient identified by MRN, date of birth, ID band Patient awake    Reviewed: NPO status , Patient's Chart, lab work & pertinent test results, Unable to perform ROS - Chart review only  History of Anesthesia Complications (+) Family history of anesthesia reactionNegative for: history of anesthetic complications  Airway Mallampati: Intubated       Dental  (+) Teeth Intact, Dental Advisory Given   Pulmonary sleep apnea , pneumonia, resolved Intubated on mechanical ventilation Off CPAP since weight loss   breath sounds clear to auscultation + decreased breath sounds      Cardiovascular hypertension,  Rhythm:Regular Rate:Tachycardia  Left ventricle: The cavity size was normal. Wall thickness was    normal. Systolic function was normal. The estimated ejection    fraction was in the range of 55% to 60%. Doppler parameters are    consistent with abnormal left ventricular relaxation (grade 1    diastolic dysfunction).    On pressors to maintain B/P Has been off antihypertensive medications after gastric bypass    Neuro/Psych  Headaches PSYCHIATRIC DISORDERS Anxiety Depression Bipolar Disorder    Neuromuscular disease    GI/Hepatic Neg liver ROS, hiatal hernia,GERD  Medicated,,Lab Results      Component                Value               Date                      ALT                      130 (H)             10/30/2024                AST                      123 (H)             10/30/2024                ALKPHOS                  48                  10/30/2024                BILITOT                  <0.2                10/30/2024            Bowel obstruction S/P Exploratory laparotomy, lysis of adhesions, G-tube 11/18 Remote Hx/o gastric sleeve Hx/o gastroparesis   Endo/Other  diabetes, Well Controlled, Type 2Hypothyroidism    Renal/GU On Bicarb gtt Lab Results      Component                Value                Date                      NA                       133 (L)             10/31/2024  CL                       96 (L)              10/31/2024                K                        2.9 (L)             10/31/2024                CO2                      27                  10/31/2024                BUN                      26 (H)              10/31/2024                CREATININE               1.12 (H)            10/31/2024                GFRNONAA                 57 (L)              10/31/2024                CALCIUM                   6.3 (LL)            10/31/2024                PHOS                     3.6                 10/31/2024                ALBUMIN                   2.2 (L)             10/30/2024                GLUCOSE                  136 (H)             10/31/2024             negative genitourinary   Musculoskeletal  (+) Arthritis ,  DJD   Abdominal   Peds  Hematology  (+) Blood dyscrasia, anemia Lab Results      Component                Value               Date                      WBC  11.6 (H)            10/31/2024                HGB                      12.1                10/31/2024                HCT                      35.7 (L)            10/31/2024                MCV                      85.0                10/31/2024                PLT                      29 (LL)             10/31/2024              Anesthesia Other Findings   Reproductive/Obstetrics                              Anesthesia Physical Anesthesia Plan  ASA: 4  Anesthesia Plan: General   Post-op Pain Management:    Induction: Intravenous  PONV Risk Score and Plan: 4 or greater and Ondansetron   Airway Management Planned: Oral ETT  Additional Equipment: Arterial line and CVP  Intra-op Plan:   Post-operative Plan: Post-operative intubation/ventilation  Informed Consent: I have reviewed the patients History and Physical, chart,  labs and discussed the procedure including the risks, benefits and alternatives for the proposed anesthesia with the patient or authorized representative who has indicated his/her understanding and acceptance.     Only emergency history available  Plan Discussed with: CRNA, Surgeon and Anesthesiologist  Anesthesia Plan Comments:          Anesthesia Quick Evaluation

## 2024-11-01 ENCOUNTER — Inpatient Hospital Stay (HOSPITAL_COMMUNITY)

## 2024-11-01 ENCOUNTER — Encounter (HOSPITAL_COMMUNITY): Payer: Self-pay | Admitting: Anesthesiology

## 2024-11-01 ENCOUNTER — Encounter (HOSPITAL_COMMUNITY): Payer: Self-pay | Admitting: General Surgery

## 2024-11-01 ENCOUNTER — Inpatient Hospital Stay: Payer: Self-pay

## 2024-11-01 DIAGNOSIS — I5020 Unspecified systolic (congestive) heart failure: Secondary | ICD-10-CM

## 2024-11-01 DIAGNOSIS — A4151 Sepsis due to Escherichia coli [E. coli]: Secondary | ICD-10-CM | POA: Diagnosis not present

## 2024-11-01 DIAGNOSIS — R6521 Severe sepsis with septic shock: Secondary | ICD-10-CM

## 2024-11-01 DIAGNOSIS — R0989 Other specified symptoms and signs involving the circulatory and respiratory systems: Secondary | ICD-10-CM | POA: Diagnosis not present

## 2024-11-01 DIAGNOSIS — R509 Fever, unspecified: Secondary | ICD-10-CM | POA: Diagnosis not present

## 2024-11-01 DIAGNOSIS — K56609 Unspecified intestinal obstruction, unspecified as to partial versus complete obstruction: Secondary | ICD-10-CM | POA: Diagnosis not present

## 2024-11-01 DIAGNOSIS — A419 Sepsis, unspecified organism: Secondary | ICD-10-CM | POA: Diagnosis not present

## 2024-11-01 DIAGNOSIS — K859 Acute pancreatitis without necrosis or infection, unspecified: Secondary | ICD-10-CM | POA: Diagnosis not present

## 2024-11-01 DIAGNOSIS — D696 Thrombocytopenia, unspecified: Secondary | ICD-10-CM

## 2024-11-01 DIAGNOSIS — I742 Embolism and thrombosis of arteries of the upper extremities: Secondary | ICD-10-CM

## 2024-11-01 DIAGNOSIS — Z9911 Dependence on respirator [ventilator] status: Secondary | ICD-10-CM

## 2024-11-01 DIAGNOSIS — R578 Other shock: Secondary | ICD-10-CM

## 2024-11-01 LAB — CBC
HCT: 32.6 % — ABNORMAL LOW (ref 36.0–46.0)
HCT: 30.2 % — ABNORMAL LOW (ref 36.0–46.0)
Hemoglobin: 11 g/dL — ABNORMAL LOW (ref 12.0–15.0)
Hemoglobin: 10 g/dL — ABNORMAL LOW (ref 12.0–15.0)
MCH: 28.9 pg (ref 26.0–34.0)
MCH: 29.2 pg (ref 26.0–34.0)
MCHC: 33.7 g/dL (ref 30.0–36.0)
MCHC: 33.1 g/dL (ref 30.0–36.0)
MCV: 85.8 fL (ref 80.0–100.0)
MCV: 88 fL (ref 80.0–100.0)
Platelets: 10 K/uL — CL (ref 150–400)
Platelets: 15 K/uL — CL (ref 150–400)
RBC: 3.8 MIL/uL — ABNORMAL LOW (ref 3.87–5.11)
RBC: 3.43 MIL/uL — ABNORMAL LOW (ref 3.87–5.11)
RDW: 14.6 % (ref 11.5–15.5)
RDW: 14.7 % (ref 11.5–15.5)
WBC: 14.3 K/uL — ABNORMAL HIGH (ref 4.0–10.5)
WBC: 9.6 K/uL (ref 4.0–10.5)
nRBC: 0.1 % (ref 0.0–0.2)
nRBC: 0 % (ref 0.0–0.2)

## 2024-11-01 LAB — CULTURE, BLOOD (ROUTINE X 2)

## 2024-11-01 LAB — RETICULOCYTES
Immature Retic Fract: 18.3 % — ABNORMAL HIGH (ref 2.3–15.9)
RBC.: 3.72 MIL/uL — ABNORMAL LOW (ref 3.87–5.11)
Retic Count, Absolute: 27.2 K/uL (ref 19.0–186.0)
Retic Ct Pct: 0.7 % (ref 0.4–3.1)

## 2024-11-01 LAB — SODIUM, URINE, RANDOM: Sodium, Ur: <30 mmol/L

## 2024-11-01 LAB — POCT I-STAT 7, (LYTES, BLD GAS, ICA,H+H)
Acid-Base Excess: 4 mmol/L — ABNORMAL HIGH (ref 0.0–2.0)
Bicarbonate: 27.3 mmol/L (ref 20.0–28.0)
Calcium, Ion: 0.96 mmol/L — ABNORMAL LOW (ref 1.15–1.40)
HCT: 33 % — ABNORMAL LOW (ref 36.0–46.0)
Hemoglobin: 11.2 g/dL — ABNORMAL LOW (ref 12.0–15.0)
O2 Saturation: 98 %
Patient temperature: 35.4
Potassium: 3.5 mmol/L (ref 3.5–5.1)
Sodium: 128 mmol/L — ABNORMAL LOW (ref 135–145)
TCO2: 28 mmol/L (ref 22–32)
pCO2 arterial: 31.8 mmHg — ABNORMAL LOW (ref 32–48)
pH, Arterial: 7.536 — ABNORMAL HIGH (ref 7.35–7.45)
pO2, Arterial: 93 mmHg (ref 83–108)

## 2024-11-01 LAB — COOXEMETRY PANEL
Carboxyhemoglobin: 0.9 % (ref 0.5–1.5)
Carboxyhemoglobin: 1.6 % — ABNORMAL HIGH (ref 0.5–1.5)
Carboxyhemoglobin: 2 % — ABNORMAL HIGH (ref 0.5–1.5)
Methemoglobin: <0.7 % (ref 0.0–1.5)
Methemoglobin: <0.7 % (ref 0.0–1.5)
Methemoglobin: <0.7 % (ref 0.0–1.5)
O2 Saturation: 49.2 %
O2 Saturation: 65.4 %
O2 Saturation: 74.9 %
Total hemoglobin: 11 g/dL — ABNORMAL LOW (ref 12.0–16.0)
Total hemoglobin: 10.5 g/dL — ABNORMAL LOW (ref 12.0–16.0)
Total hemoglobin: 10.5 g/dL — ABNORMAL LOW (ref 12.0–16.0)

## 2024-11-01 LAB — COMPREHENSIVE METABOLIC PANEL WITH GFR
ALT: 103 U/L — ABNORMAL HIGH (ref 0–44)
AST: 124 U/L — ABNORMAL HIGH (ref 15–41)
Albumin: 2.1 g/dL — ABNORMAL LOW (ref 3.5–5.0)
Alkaline Phosphatase: 62 U/L (ref 38–126)
Anion gap: 8 (ref 5–15)
BUN: 28 mg/dL — ABNORMAL HIGH (ref 6–20)
CO2: 28 mmol/L (ref 22–32)
Calcium: 7.1 mg/dL — ABNORMAL LOW (ref 8.9–10.3)
Chloride: 90 mmol/L — ABNORMAL LOW (ref 98–111)
Creatinine, Ser: 1.02 mg/dL — ABNORMAL HIGH (ref 0.44–1.00)
GFR, Estimated: >60 mL/min (ref >60)
Glucose, Bld: 83 mg/dL (ref 70–99)
Potassium: 3.9 mmol/L (ref 3.5–5.1)
Sodium: 126 mmol/L — ABNORMAL LOW (ref 135–145)
Total Bilirubin: 0.7 mg/dL (ref 0.0–1.2)
Total Protein: 4.3 g/dL — ABNORMAL LOW (ref 6.5–8.1)

## 2024-11-01 LAB — ECHOCARDIOGRAM COMPLETE
Calc EF: 16.7 %
Height: 64 in
Single Plane A2C EF: 21.3 %
Single Plane A4C EF: 13.8 %
Weight: 2112 [oz_av]

## 2024-11-01 LAB — PREPARE PLATELET PHERESIS
Unit division: 0
Unit division: 0

## 2024-11-01 LAB — GLUCOSE, CAPILLARY
Glucose-Capillary: 84 mg/dL (ref 70–99)
Glucose-Capillary: 89 mg/dL (ref 70–99)
Glucose-Capillary: 85 mg/dL (ref 70–99)
Glucose-Capillary: 97 mg/dL (ref 70–99)
Glucose-Capillary: 71 mg/dL (ref 70–99)

## 2024-11-01 LAB — MAGNESIUM: Magnesium: 2.1 mg/dL (ref 1.7–2.4)

## 2024-11-01 LAB — BLOOD GAS, VENOUS
Acid-Base Excess: 5.7 mmol/L — ABNORMAL HIGH (ref 0.0–2.0)
Bicarbonate: 30.6 mmol/L — ABNORMAL HIGH (ref 20.0–28.0)
O2 Saturation: 85.7 %
Patient temperature: 37
pCO2, Ven: 45 mmHg (ref 44–60)
pH, Ven: 7.44 — ABNORMAL HIGH (ref 7.25–7.43)
pO2, Ven: 54 mmHg — ABNORMAL HIGH (ref 32–45)

## 2024-11-01 LAB — LACTIC ACID, PLASMA
Lactic Acid, Venous: 1.7 mmol/L (ref 0.5–1.9)
Lactic Acid, Venous: 1.4 mmol/L (ref 0.5–1.9)

## 2024-11-01 LAB — BPAM PLATELET PHERESIS
Blood Product Expiration Date: 202511202359
Blood Product Expiration Date: 202511232359
ISSUE DATE / TIME: 202511201119
ISSUE DATE / TIME: 202511201137
Unit Type and Rh: 5100
Unit Type and Rh: 6200

## 2024-11-01 LAB — PHOSPHORUS: Phosphorus: 4.8 mg/dL — ABNORMAL HIGH (ref 2.5–4.6)

## 2024-11-01 LAB — OSMOLALITY: Osmolality: 275 mosm/kg (ref 275–295)

## 2024-11-01 LAB — FOLATE: Folate: 15.8 ng/mL (ref >5.9)

## 2024-11-01 LAB — BILIRUBIN, FRACTIONATED(TOT/DIR/INDIR)
Bilirubin, Direct: 0.4 mg/dL — ABNORMAL HIGH (ref 0.0–0.2)
Indirect Bilirubin: 0.3 mg/dL (ref 0.3–0.9)
Total Bilirubin: 0.7 mg/dL (ref 0.0–1.2)

## 2024-11-01 LAB — LIPASE, BLOOD: Lipase: 22 U/L (ref 11–51)

## 2024-11-01 LAB — VITAMIN B1: Vitamin B1 (Thiamine): 198.9 nmol/L (ref 66.5–200.0)

## 2024-11-01 LAB — PROTIME-INR
INR: 1 (ref 0.8–1.2)
Prothrombin Time: 13.9 s (ref 11.4–15.2)

## 2024-11-01 LAB — PRO BRAIN NATRIURETIC PEPTIDE: Pro Brain Natriuretic Peptide: >35000 pg/mL — ABNORMAL HIGH (ref <300.0)

## 2024-11-01 LAB — TROPONIN T, HIGH SENSITIVITY
Troponin T High Sensitivity: 555 ng/L (ref 0–19)
Troponin T High Sensitivity: 481 ng/L (ref 0–19)

## 2024-11-01 LAB — COPPER, SERUM: Copper: 67 ug/dL — ABNORMAL LOW (ref 80–158)

## 2024-11-01 LAB — DIRECT ANTIGLOBULIN TEST (NOT AT ARMC)
DAT, IgG: NEGATIVE
DAT, complement: NEGATIVE

## 2024-11-01 LAB — LACTATE DEHYDROGENASE: LDH: 418 U/L — ABNORMAL HIGH (ref 105–235)

## 2024-11-01 MED ORDER — TRAVASOL 10 % IV SOLN: INTRAVENOUS | Status: DC

## 2024-11-01 MED ORDER — CALCIUM GLUCONATE-NACL 1-0.675 GM/50ML-% IV SOLN
1.0000 g | Freq: Once | INTRAVENOUS | Status: AC
  Administered 2024-11-01: 1000 mg via INTRAVENOUS
  Filled 2024-11-01: qty 50

## 2024-11-01 MED ORDER — DOBUTAMINE-DEXTROSE 4-5 MG/ML-% IV SOLN
1.0000 ug/kg/min | INTRAVENOUS | Status: DC
  Administered 2024-11-01 (×2): 2.5 ug/kg/min via INTRAVENOUS
  Filled 2024-11-01 (×2): qty 250

## 2024-11-01 MED ORDER — SODIUM CHLORIDE 0.9% FLUSH: 10.0000 mL | INTRAVENOUS | Status: DC | PRN

## 2024-11-01 MED ORDER — INSULIN ASPART 100 UNIT/ML IJ SOLN: 0.0000 [IU] | INTRAMUSCULAR | Status: DC

## 2024-11-01 MED ORDER — SODIUM CHLORIDE 0.9% IV SOLUTION: Freq: Once | INTRAVENOUS | Status: AC

## 2024-11-01 NOTE — Progress Notes (Signed)
 Upper and lower venous duplex studies completed. Ricka Elder Molt, RDMS, RVT

## 2024-11-01 NOTE — Anesthesia Postprocedure Evaluation (Signed)
 Anesthesia Post Note  Patient: Morgan [name]  Procedure(s) Performed: LAPAROTOMY, EXPLORATORY, PLACEMENT OF G-TUBE, WOUND VAC     Patient location during evaluation: SICU Anesthesia Type: General Level of consciousness: sedated Pain management: pain level controlled Vital Signs Assessment: post-procedure vital signs reviewed and stable Respiratory status: patient remains intubated per anesthesia plan Cardiovascular status: stable Postop Assessment: no apparent nausea or vomiting Anesthetic complications: no   No notable events documented.               Ayush Boulet

## 2024-11-01 NOTE — Progress Notes (Signed)
 Patient's niece called and informed by RN that patient would be transported to [facility] hospital room [facility]

## 2024-11-01 NOTE — Progress Notes (Signed)
 CVP line occluded. RN unable to draw blood from CVP for labs or get an accurate CVP reading on the monitor

## 2024-11-01 NOTE — Progress Notes (Addendum)
 PCCM Interval Note  Afternoon rounding:   - off pressors since around lunch, MAP remains stable > 65 - remains afebrile today - s/p PICC placement> will hold off on d/c R internal jugular for now given severe thrombocytopenia - repeat coox (on PICC) 74 on dobutamine  2.5 - CVP 6 - lactic 1.3 - post plt transfusion 10> 15, H/H 11/ 32> 10/30 - coombs neg, unfractionated bili normal, LDH 418, folate normal, normal retic count - dopplers show chronic LLE DVT of left posterior tibial and left peroneal veins - left distal radial artery occlusion. Mottling of left hand improved after removal of left axillary aline this am and titration off vasopressor support.  Vascular formally consulted, appreciate assistance.  Spoke with Dr. Lanis, currently not a candidate for any intervention at this time given critical illness  - VBG post vent changes of rate 21> 14 now 7.44/ 45 - tentative plans to return to OR 11/22 for abd closure with CCS    Add CCT 25 mins  Lyle Pesa, NP [facility] 11/01/2024, 5:18 PM  See Amion for pager If no response to pager , please call [phone] until 7pm After 7:00 pm call Elink  [phone]

## 2024-11-01 NOTE — Progress Notes (Signed)
 Peripherally Inserted Central Catheter Placement  The IV Nurse has discussed with the patient and/or persons authorized to consent for the patient, the purpose of this procedure and the potential benefits and risks involved with this procedure.  The benefits include less needle sticks, lab draws from the catheter, and the patient may be discharged home with the catheter. Risks include, but not limited to, infection, bleeding, blood clot (thrombus formation), and puncture of an artery; nerve damage and irregular heartbeat and possibility to perform a PICC exchange if needed/ordered by physician.  Alternatives to this procedure were also discussed.  Bard Power PICC patient education guide, fact sheet on infection prevention and patient information card has been provided to patient /or left at bedside.    PICC Placement Documentation  PICC Triple Lumen 11/01/24 Right Basilic 40 cm 0 cm (Active)  Indication for Insertion or Continuance of Line Vasoactive infusions 11/01/24 1335  Exposed Catheter (cm) 0 cm 11/01/24 1335  Site Assessment Clean, Dry, Intact 11/01/24 1335  Lumen #1 Status Saline locked;Blood return noted 11/01/24 1335  Lumen #2 Status Flushed;Saline locked;Blood return noted 11/01/24 1335  Lumen #3 Status Flushed;Saline locked;Blood return noted 11/01/24 1335  Dressing Type Transparent;Securing device 11/01/24 1335  Dressing Status Antimicrobial disc/dressing in place;Clean, Dry, Intact 11/01/24 1335  Line Care Connections checked and tightened 11/01/24 1335  Line Adjustment (NICU/IV Team Only) No 11/01/24 1335  Dressing Intervention New dressing 11/01/24 1335  Dressing Change Due 11/08/24 11/01/24 1335   Telephone consent obtained from [name], [place].    Morgan [name] 11/01/2024, 1:35 PM

## 2024-11-01 NOTE — Progress Notes (Addendum)
 PHARMACY - TOTAL PARENTERAL NUTRITION CONSULT NOTE   Indication: intolerance to enteral feeding  Patient Measurements: Height: 5' 4 (162.6 cm) Weight: 78.7 kg (173 lb 8 oz) IBW/kg (Calculated) : 54.7 TPN AdjBW (KG): 59.9 Body mass index is 29.78 kg/m.  Assessment: 58 year old female history of gastric bypass presented with abdominal pain found to have small bowel obstruction. With worsening exam, hypotension and rising lactic acid, pt was taken for emergent ex lap with lysis of adhesions, placement of G tube and abthera vac. Transported to CCU with open abdomen, sedated on mechanical ventilation and requiring 3 vasopressors for shock. Return to OR 11/20 for re-exploration - no evidence of ischemia, necrosis or perforation noted; placement of abthera open abdomen wound vac. Some improvement in vasopressor requirements following return from OR.  Pharmacy consulted for TPN management in setting of anticipated prolonged intolerance to enteral feeding.  Glucose / Insulin : hx DM2, no longer on medication following gastric bypass (A1c 5.3) -CBGs 58 - 133 (currently on stress dose steroids). Goal <150 -Semglee  5 units BID + rSSI and 4u q4h with hypoglycemia Electrolytes: Na (126) low, CorrCa (8.6) slightly low; Phos (4.8) high. K/Mg WNL -Cl low Renal: SCr 1.02, BUN slightly elevated Hepatic: AST/ALT elevated but trending down, albumin  low -Alk Phos, T.bili WNL Intake / Output; MIVF: No mIVF, remains on pressors -UOP: 1125 mL, drain output: 1775 mL -I/O Net: +1465 mL GI Imaging: 11/18 CT a/p: findings suspicious for obstruction of the excluded stomach and duodenum, likely secondary to adhesions GI Surgeries / Procedures:  11/18 ex lap, lysis of adhesions, placement of G tube, placement of abthera vac 11/20 re-exploration of abdomen and closure of mesentery defect, placement of abthera open abdomen wound vac  Central access: Needs PICC placement TPN start date: 11/21 pending PICC  placement  Nutritional Goals: Per d/w RD - attempt to target higher end of protein needs Pt on propofol  which will contribute to patient's caloric needs  RD Assessment: Estimated Needs Total Energy Estimated Needs: 1800-2000 kcals Total Protein Estimated Needs: 90-110 grams Total Fluid Estimated Needs: >/= 1.8L  Current Nutrition: NPO, TPN -Propofol  currently infusing at 14.38 mL/hour  Plan:  Now: Semglee  discontinued Reduce q4h SSI from resistant to moderate  At 18:00: Start TPN at 30 mL/hr Will provide 528 kcal, 40 protein On propofol , current rate provides 345 kcal/day. Will withhold lipids in TPN Electrolytes in TPN: No Phos Na 100 mEq/L, K 50 mEq/L, Ca 5 mEq/L, Mg 5 mEq/L, and Phos 0 mmol/L.  Cl:Ac 1:1 Add standard MVI and trace elements to TPN Thiamine  100 mg IV daily x 6 days Continue Moderate q4h SSI and adjust as needed  MIVF management per MD Monitor TPN labs on Mon/Thurs, daily throughout the weekend given risk of refeeding.   Ronal CHRISTELLA Rav, PharmD, BCPS Clinical Pharmacist 11/01/2024 7:55 AM  Addendum: TPN consult cancelled by CCS. Will leave in orders to check electrolytes through the weekend per d/w CCS and CCM.   Ronal CHRISTELLA Rav, PharmD 11/01/24 9:36 AM

## 2024-11-01 NOTE — Progress Notes (Signed)
 Left upper ext arterial duplex  has been completed. Refer to [facility] under chart review to view preliminary results. Results given to patient's nurse.     11/01/2024  2:53 PM Heydy Montilla, Ricka BIRCH

## 2024-11-01 NOTE — Progress Notes (Addendum)
 NAME:  Morgan [name], MRN:  [id number], DOB:  Mar 16, 1966, LOS: 3 ADMISSION DATE:  10/29/2024, CONSULTATION DATE:  10/30/2024 REFERRING MD:  Dr. Vernetta CHIEF COMPLAINT:  Small bowel Obstruction   Brief HPI  Briefly, 58 year old female with history of Roux-en-Y gastric bypass who presented to the emergency department yesterday evening with abdominal pain, lactic acidosis and lipase greater than 2800.  She had CCS evaluation and went for exploratory laparotomy.  Found to have dense adhesive band that was obstructing her proximal small bowel as well as biliary leak without evidence of perforation.  She had a gastrostomy tube placed in the left open with a wound VAC.  She is in continuity.  Also in septic shock, she returned from OR to ICU on Levophed , vasopressin , intubated on bicarb drip.  Pertinent  Medical History   Past Medical History:  Diagnosis Date   Allergic rhinitis    Anemia     after gastric bypass in 2018   Anxiety    Barrett's esophagus    Bipolar affective disorder (HCC)    CAP (community acquired pneumonia) 07/20/2018   Chronic respiratory failure (HCC)    Constipation, chronic    Degenerative joint disease of spine    Depression    Diabetes mellitus without complication (HCC)    type 2    DM (diabetes mellitus) (HCC)    hypoglycemic per pt - no longer takes DM meds due to weight loss from gastric bypass in 2018   Dry eye    Family history of adverse reaction to anesthesia    nausea and vomiting   Gastroparesis    GERD (gastroesophageal reflux disease)    H/O shoulder replacement 08/11/2020   left shoulder   History of bariatric surgery 03/2017   History of hiatal hernia    HLD (hyperlipidemia) 03/12/2013   Hyperlipidemia    Hypertension    No HTN meds since weight loss from Gastric Bypass in 2018   Intractable chronic migraine without aura 06/04/2015   Migraine headache    Morbid obesity (HCC)    OSA (obstructive sleep apnea)    No cpap since gastric  surgery   Osteoarthritis    bilateral knee   SBO (small bowel obstruction) (HCC) 03/19/2019   Sleep apnea    Unspecified hypothyroidism    Vitamin B 12 deficiency    Vitamin D  deficiency      Significant Hospital Events: Including procedures, antibiotic start and stop dates in addition to other pertinent events   11/18 admit 11/20 back to OR for look, weaning pressors   Interim History / Subjective:  Off epi, remains on NE 11, vaso 0.4 Tmax 102.2 Plts 10 Worsening mottling in left hand  Objective    Blood pressure (!) 85/71, pulse 86, temperature (!) 96.6 F (35.9 C), resp. rate (!) 23, height 5' 4 (1.626 m), weight 78.7 kg, last menstrual period 10/17/2017, SpO2 100%. CVP:  [6 mmHg-7 mmHg] 6 mmHg  Vent Mode: PRVC FiO2 (%):  [40 %-50 %] 40 % Set Rate:  [21 bmp-26 bmp] 21 bmp Vt Set:  [480 mL] 480 mL PEEP:  [5 cmH20] 5 cmH20 Plateau Pressure:  [15 cmH20-16 cmH20] 15 cmH20   Intake/Output Summary (Last 24 hours) at 11/01/2024 0728 Last data filed at 11/01/2024 0458 Gross per 24 hour  Intake 4370.51 ml  Output 2905 ml  Net 1465.51 ml   Filed Weights   10/29/24 1434 11/01/24 0500  Weight: 59.9 kg 78.7 kg    Examination: Propofol  40,  fent 25 General:  critically ill adult female sedated on MV in NAD HEENT: MM pink/moist, ETT 7 at19, pupils 3/r Neuro: sedated CV: rr, SR/ SB PULM:  MV supported, clear, no secretions, no cough GI: soft, no appreciated bowel sounds, midline incision with wound vac- mostly serous drainage, Gtube to straight drain Extremities: warm except hands/ feet cool/dry, no pitting LE edema, significant mottling of left hand, absent radial signals, +brachial with doppler.  Remaining extremities with doppler signals   Tmax 102.2 UOP 1.1L/ 24hrs Gtube 50 ml/ 24hrs  Wound VAC 828ml/ 12hr Net +13.5L  Labs> Na 133> 126, K 3.9, bicarb 28, BUN/ sCr 26/ 1.12> 28/ 1.02, WBC 11.6> 14.3, H/H 12.1/ 35> 11/ 32.6, plts 29 > 45> 19> 37>10  11/20 TTE > EF  20-25%, +RWMA (lateral wall, anterior septum, inferoseptal apical anterior segment, apical inferior), normal RV, trivial MR  Resolved problem list   Assessment and Plan  Small bowel obstruction status post exploratory laparotomy with lysis of adhesions Status post gastrostomy tube and wound VAC Pancreatitis Elevated LFTs likely shock liver Moderate PCM - s/p washout 11/20 P:  - per CCS - abx as below, remains on zosyn  and micafungin  - plans to return to OR 11/22 for hopeful closure - stable LFTs, cont to trend - Gtube to straight drain for now.  CCS decide TPN vs using Gtube - cont thiamine  - pending vitamin levels  - optimize electrolytes   Shock - septic, r/o cardiogenic   Acute systolic HF- concerning given CAD hx vs septic cardiomyopathy.  Lactic acidosis E. Coli bacteremia  P:  - continuing to wean vasopressors, NE and vaso for MAP goal >65 - PICC pending - CVP 6/7 last evening however distal port on CVL  - lactic 1.7 reassuring - recheck EKG and cook, and send trop hs and BNP - consult cardiology for further recs. Recent CT calcium  score 8/25 w/ CAC 176 (97th percentile) with calcification in LAD and RCA  - not a candidate for heparin  currently given severe thrombocytopenia - given ongoing fevers and thrombocytopenia- check extremities dopplers  Acute blood loss anemia Severe thrombocytopenia Leukopenia, resolved  - s/p transfusion 2 plts 1/20 P:  - H/H remains overall stable - platelets down to 10> no obvious signs of bleeding.  Left hand is ischemic- this could be related to prior left radial aline which was removed 11/20 am given non-functional.  No other ischemic extremities at this time.  - 1 unit of platelets to be transfused today, recheck labs post transfusion - 4H score intermediate but timing does not fit, no known recent exposure.  Lovenox  held 11/20.  - suspect related to septic shock but will send LDH, unconjugated bili, reticulocytes, coombs test and  consider hematology consult  Acute hypoxic respiratory failure postoperatively LLL PNA- suspect aspiration from SBO - cont full LTVV support, 4-8cc/kg IBW with goal Pplat <30 and DP<15  - VAP prevention protocol/ PPI - PAD protocol for sedation> propofol / fent gtt with open abd, RASS goal -4/-5 - intermittent CXR - ABG this am> 7.5/ 31/ 93/ 27> will adjust RR - wean FiO2 as able for SpO2 >92%  - defer SAT & SBT w/ open abd - aggressive pulmonary hygiene - prn albuterol - abx as above.  No secretions for cx  Type 2 diabetes mellitus - hypoglycemic event overnight, likely from coming off bicarb gtt which had D5 yest - stop semglee  - cont CBG q 4hrs and prn  - prn SSI - goal 140-180  Acute  kidney injury Metabolic acidosis, resolved Hypokalemia, resolved Hypocalcemia  Hyponatremia - bicarb w/ D5 stopped 11/21.  Remains Net +13L but does not appear clinically overloaded on exam - stable sCr and UOP - check osmolality and uNa - replete electrolytes as needed - trend renal indices  - strict I/Os, daily wts - avoid nephrotoxins, renal dose meds, hemodynamic support as above  Left hand ischemia - previous left radial aline, d/c'd 11/20 after non-functioning with progressive ischemia and absent radial signals, brachial signals present.  Check arterial dupplex and will d/c left axillary aline and cont to monitor, low threshold for vascular consult, but currently with critically illness, shock, and plts 10, do not suspect she would be candidate for intervention at this time.   Full code SCDs only  Pending update with niece 11/21  Labs   CBC: Recent Labs  Lab 10/29/24 1541 10/29/24 2131 10/30/24 0220 10/31/24 0446 10/31/24 0736 10/31/24 0931 10/31/24 0945 10/31/24 1407 11/01/24 0450  WBC 1.1*  --  2.0* 11.6* 11.6*  --   --  12.7* 14.3*  NEUTROABS 1.0*  --   --   --  10.5*  --   --   --   --   HGB 13.0   < > 11.3* 12.1 12.4 11.9*  --  11.3* 11.0*  HCT 42.4   < > 35.0*  35.7* 38.0 35.0*  --  33.3* 32.6*  MCV 94.4  --  91.6 85.0 88.8  --   --  85.2 85.8  PLT 362  --  323 29* 45*  --  19* 37* 10*   < > = values in this interval not displayed.    Basic Metabolic Panel: Recent Labs  Lab 10/29/24 1541 10/29/24 2131 10/30/24 0220 10/30/24 0921 10/31/24 0446 10/31/24 0614 10/31/24 0931 10/31/24 1837 11/01/24 0450  NA 142   < > 137  --  133*  --  130* 128* 126*  K 3.3*   < > 3.2*  --  2.9*  --  2.9* 3.5 3.9  CL 108  --  108  --  96*  --   --  91* 90*  CO2 21*  --  19*  --  27  --   --  30 28  GLUCOSE 141*  --  241*  --  136*  --   --  65* 83  BUN 25*  --  27*  --  26*  --   --  25* 28*  CREATININE 1.12*  --  1.47*  --  1.12*  --   --  0.99 1.02*  CALCIUM  7.9*  --  7.5*  --  6.3*  --   --  7.0* 7.1*  MG 1.7  --   --  1.5* 2.0  --   --   --  2.1  PHOS  --   --   --   --   --  3.6  --   --  4.8*   < > = values in this interval not displayed.   GFR: Estimated Creatinine Clearance: 61.8 mL/min (A) (by C-G formula based on SCr of 1.02 mg/dL (H)). Recent Labs  Lab 10/30/24 0031 10/30/24 0220 10/31/24 0446 10/31/24 0736 10/31/24 1008 10/31/24 1240 10/31/24 1407 11/01/24 0450  WBC  --    < > 11.6* 11.6*  --   --  12.7* 14.3*  LATICACIDVEN 3.3*  --   --   --  3.0* 3.3*  --  1.7   < > = values  in this interval not displayed.    Liver Function Tests: Recent Labs  Lab 10/29/24 1541 10/30/24 1141 11/01/24 0450  AST 383* 123* 124*  ALT 272* 130* 103*  ALKPHOS 127* 48 62  BILITOT 0.3 <0.2 0.7  PROT 5.6* 3.4* 4.3*  ALBUMIN  3.4* 2.2* 2.1*   Recent Labs  Lab 10/29/24 1541 10/30/24 0220 10/31/24 0446 11/01/24 0450  LIPASE >2,800* 882* 78* 22   No results for input(s): AMMONIA in the last 168 hours.  ABG    Component Value Date/Time   PHART 7.55 (H) 10/31/2024 1411   PCO2ART 37 10/31/2024 1411   PO2ART 79 (L) 10/31/2024 1411   HCO3 32.4 (H) 10/31/2024 1411   TCO2 30 10/31/2024 0931   ACIDBASEDEF 6.7 (H) 10/30/2024 1055   O2SAT  98.4 10/31/2024 1411     Coagulation Profile: Recent Labs  Lab 10/29/24 1710 10/31/24 0945 11/01/24 0450  INR 1.0 1.8* 1.0    Cardiac Enzymes: No results for input(s): CKTOTAL, CKMB, CKMBINDEX, TROPONINI in the last 168 hours.  HbA1C: HB A1C (BAYER DCA - WAIVED)  Date/Time Value Ref Range Status  06/28/2024 08:40 AM 5.1 4.8 - 5.6 % Final    Comment:             Prediabetes: 5.7 - 6.4          Diabetes: >6.4          Glycemic control for adults with diabetes: <7.0   11/29/2023 08:24 AM 5.8 (H) 4.8 - 5.6 % Final    Comment:             Prediabetes: 5.7 - 6.4          Diabetes: >6.4          Glycemic control for adults with diabetes: <7.0    Hgb A1c MFr Bld  Date/Time Value Ref Range Status  10/30/2024 02:19 AM 5.3 4.8 - 5.6 % Final    Comment:    (NOTE) Diagnosis of Diabetes The following HbA1c ranges recommended by the American Diabetes Association (ADA) may be used as an aid in the diagnosis of diabetes mellitus.  Hemoglobin             Suggested A1C NGSP%              Diagnosis  <5.7                   Non Diabetic  5.7-6.4                Pre-Diabetic  >6.4                   Diabetic  <7.0                   Glycemic control for                       adults with diabetes.      CBG: Recent Labs  Lab 10/31/24 1537 10/31/24 2044 10/31/24 2111 10/31/24 2357 11/01/24 0442  GLUCAP 134* 65* 133* 84 84    Allergies Allergies  Allergen Reactions   Ketoconazole Hives, Swelling and Other (See Comments)    SWELLING REACTION UNSPECIFIED    Pravachol  [Pravastatin  Sodium] Shortness Of Breath, Swelling and Anaphylaxis    Throat swelling   Statins Other (See Comments)    Leg cramps   Tape Dermatitis and Other (See Comments)    Steri-Strips   Repatha  [Evolocumab ] Other (See  Comments)    Made the stomach hurt badly.   Codeine Other (See Comments) and Hypertension    Increased B/P     Home Medications  Prior to Admission medications    Medication Sig Start Date End Date Taking? Authorizing Provider  Blood Pressure Monitoring KIT Check blood pressure daily 10/03/24   Swinyer, Rosaline HERO, NP  Calcium -Vitamin D -Vitamin K  650-12.5-40 MG-MCG-MCG CHEW Chew 1 each by mouth 2 (two) times daily.    [provider]  cyclobenzaprine  (FLEXERIL ) 10 MG tablet Take 1 tablet (10 mg total) by mouth 3 (three) times daily as needed for muscle spasms. 06/28/24   [name], DO  desloratadine  (CLARINEX ) 5 MG tablet TAKE ONE TABLET DAILY 05/13/24   Jolinda Norene M, DO  dicyclomine  (BENTYL ) 20 MG tablet TAKE 1 TABLET EVERY 8 HOURS AS NEEDED FOR ABDOMINAL CRAMPING 12/29/23   Jolinda Norene M, DO  Erenumab -aooe 140 MG/ML SOAJ Inject 140 mg into the skin every 30 (thirty) days. 06/24/24   Lomax, Amy, NP  Evolocumab  (REPATHA  SURECLICK) 140 MG/ML SOAJ Inject 140 mg into the skin every 14 (fourteen) days. 09/23/24   Swinyer, Rosaline HERO, NP  ezetimibe  (ZETIA ) 10 MG tablet TAKE ONE TABLET DAILY 08/13/24   Jolinda Norene M, DO  gabapentin  (NEURONTIN ) 300 MG capsule Takes 2 capsules in morning, one cap at lunch, 2 caps at bedtime 06/03/24   Lomax, Amy, NP  Glucagon , rDNA, (GLUCAGON  EMERGENCY IJ) Inject 1 Syringe as directed daily as needed (emergency low blood sugar).     [provider]  glucose blood (ONETOUCH ULTRA) test strip Check BS four times a day Dx E11.9 09/11/23   Jolinda Norene M, DO  levothyroxine  (SYNTHROID ) 112 MCG tablet TAKE ONE TABLET ONCE DAILY BEFORE BREAKFAST 07/02/24   Jolinda Norene M, DO  linaclotide  (LINZESS ) 290 MCG CAPS capsule Take 1 capsule (290 mcg total) by mouth daily before breakfast. 07/02/24   Jolinda Norene M, DO  loratadine  (CLARITIN ) 10 MG tablet Take 10 mg by mouth daily as needed for allergies.    [provider]  metoprolol  tartrate (LOPRESSOR ) 50 MG tablet Take 1 tablet (50 mg total) by mouth once for 1 dose. Take 90-120 minutes prior to scan. Hold for SBP less than 110.  09/23/24 09/23/24  Swinyer, Rosaline HERO, NP  mometasone  (NASONEX ) 50 MCG/ACT nasal spray USE 2 SPRAYS IN EACH NOSTRIL ONCE DAILY (REPLACES FLONASE ) 07/30/24   [name], DO  Multiple Vitamins-Minerals (BARIATRIC MULTIVITAMINS/IRON  PO) Take 1 tablet by mouth daily.    [provider]  MYRBETRIQ  25 MG TB24 tablet TAKE ONE TABLET DAILY 08/13/24   Jolinda Norene M, DO  nystatin  (MYCOSTATIN /NYSTOP ) powder APPLY TO AFFECTED AREA OF GROIN RASH TWICE A DAY FOR 7 TO 10 DAYS PER FLARE 11/29/23   Jolinda Norene M, DO  pantoprazole  (PROTONIX ) 40 MG tablet TAKE ONE TABLET DAILY 07/22/24   Jolinda Norene M, DO  polyethylene glycol (MIRALAX  / GLYCOLAX ) packet Take 17 g by mouth at bedtime.    [provider]  Probiotic Product (PROBIOTIC DAILY PO) Take 1 capsule by mouth daily.    [provider]  sertraline  (ZOLOFT ) 100 MG tablet TAKE TWO TABLETS DAILY AS DIRECTED 07/02/24   Jolinda Norene M, DO  Simethicone  (GAS-X EXTRA STRENGTH) 125 MG CAPS Take 125 mg by mouth in the morning, at noon, and at bedtime.    [provider]  SUMAtriptan  (IMITREX ) 100 MG tablet Take 1 tablet (100 mg total) by mouth once as needed for  up to 1 dose for migraine. May repeat in 2 hours if headache persists or recurs. 06/03/24   Lomax, Amy, NP  ziprasidone  (GEODON ) 60 MG capsule TAKE ONE CAPSULE AT BEDTIME 08/13/24   [name], DO          CRITICAL CARE Performed by: Lyle Pesa   Total critical care time: 60 minutes  Critical care time was exclusive of separately billable procedures and treating other patients.  Critical care was necessary to treat or prevent imminent or life-threatening deterioration.  Critical care was time spent personally by me on the following activities: development of treatment plan with patient and/or surrogate as well as nursing, discussions with consultants, evaluation of patient's response to treatment, examination of patient, obtaining  history from patient or surrogate, ordering and performing treatments and interventions, ordering and review of laboratory studies, ordering and review of radiographic studies, pulse oximetry and re-evaluation of patient's condition.    Lyle Pesa, NP [facility] 11/01/2024, 7:28 AM  See Amion for pager If no response to pager , please call [phone] until 7pm After 7:00 pm call Elink  [phone]

## 2024-11-01 NOTE — Progress Notes (Signed)
 1 Day Post-Op  Subjective: Tmax 104 overnight.  Echo reviewed.  Pressor need coming down this morning.  Left hand with no palpable radial.  Site of previous a-line.    ROS: unable, on vent  Objective: Vital signs in last 24 hours: Temp:  [96.6 F (35.9 C)-104 F (40 C)] 96.6 F (35.9 C) (11/21 0700) Pulse Rate:  [85-115] 86 (11/20 2245) Resp:  [16-27] 23 (11/21 0700) BP: (85)/(71) 85/71 (11/20 0845) SpO2:  [93 %-100 %] 100 % (11/21 0412) Arterial Line BP: (84-131)/(56-82) 104/72 (11/21 0700) FiO2 (%):  [30 %-50 %] 30 % (11/21 0747) Weight:  [78.7 kg] 78.7 kg (11/21 0500) Last BM Date : 10/28/24  Intake/Output from previous day: 11/20 0701 - 11/21 0700 In: 4370.5 [I.V.:2252; Blood:600; IV Piggyback:1518.5] Out: 2905 [Urine:1125; Drains:1775; Blood:5] Intake/Output this shift: No intake/output data recorded.  PE: Gen: critically ill, sedated on vent Heart: regular, now in 50-60s Lungs: on vent Abd: soft, ABthera in place with serosang output, g-tube with no output currently GU: foley in place with yellow urine output Ext: left hand with ischemic changes and radial pulse isn't palpable, nursing planning to doppler this.  Lab Results:  Recent Labs    10/31/24 1407 11/01/24 0450  WBC 12.7* 14.3*  HGB 11.3* 11.0*  HCT 33.3* 32.6*  PLT 37* 10*   BMET Recent Labs    10/31/24 1837 11/01/24 0450  NA 128* 126*  K 3.5 3.9  CL 91* 90*  CO2 30 28  GLUCOSE 65* 83  BUN 25* 28*  CREATININE 0.99 1.02*  CALCIUM  7.0* 7.1*   PT/INR Recent Labs    10/31/24 0945 11/01/24 0450  LABPROT 21.9* 13.9  INR 1.8* 1.0   CMP     Component Value Date/Time   NA 126 (L) 11/01/2024 0450   NA 142 09/23/2024 1030   K 3.9 11/01/2024 0450   CL 90 (L) 11/01/2024 0450   CO2 28 11/01/2024 0450   GLUCOSE 83 11/01/2024 0450   BUN 28 (H) 11/01/2024 0450   BUN 17 09/23/2024 1030   CREATININE 1.02 (H) 11/01/2024 0450   CREATININE 0.82 06/18/2013 1009   CALCIUM  7.1 (L) 11/01/2024  0450   PROT 4.3 (L) 11/01/2024 0450   PROT 6.5 06/28/2024 0842   ALBUMIN  2.1 (L) 11/01/2024 0450   ALBUMIN  4.5 06/28/2024 0842   AST 124 (H) 11/01/2024 0450   ALT 103 (H) 11/01/2024 0450   ALKPHOS 62 11/01/2024 0450   BILITOT 0.7 11/01/2024 0450   BILITOT 0.3 06/28/2024 0842   GFRNONAA >60 11/01/2024 0450   GFRNONAA 87 06/18/2013 1009   GFRAA >60 08/04/2020 1038   GFRAA >89 06/18/2013 1009   Lipase     Component Value Date/Time   LIPASE 22 11/01/2024 0450       Studies/Results: ECHOCARDIOGRAM COMPLETE Result Date: 11/01/2024    ECHOCARDIOGRAM REPORT   Patient Name:   Morgan [name] [facility] Date of Exam: 10/31/2024 Medical Rec #:  [id number]        Height:       64.0 in Accession #:    [id number]       Weight:       132.0 lb Date of Birth:  11-06-1966        BSA:          1.640 m Patient Age:    58 years         BP:           130//80 mmHg Patient Gender: F  HR:           93 bpm. Exam Location:  Inpatient Procedure: 2D Echo and Intracardiac Opacification Agent (Both Spectral and Color            Flow Doppler were utilized during procedure). Indications:    Bacteremia  History:        Patient has prior history of Echocardiogram examinations.  Sonographer:    Charmaine Gaskins Referring Phys: [phone] Domanick Cuccia  Sonographer Comments: Technically difficult study due to poor echo windows, Technically challenging study due to limited acoustic windows and echo performed with patient supine and on artificial respirator. IMPRESSIONS  1. Left ventricular ejection fraction, by estimation, is 20 to 25%. The left ventricle has severely decreased function. The left ventricle demonstrates regional wall motion abnormalities (see scoring diagram/findings for description). The left ventricular internal cavity size was mildly dilated. Left ventricular diastolic function could not be evaluated.  2. Right ventricular systolic function is normal. The right ventricular size is normal.  3. The mitral valve is  grossly normal. Trivial mitral valve regurgitation. No evidence of mitral stenosis.  4. The aortic valve is grossly normal. Aortic valve regurgitation is not visualized. No aortic stenosis is present. Conclusion(s)/Recommendation(s): Regional wall motion abnormalities may be consistent with stress cardiomyopathy in the absence of obstructive CAD. FINDINGS  Left Ventricle: Left ventricular ejection fraction, by estimation, is 20 to 25%. The left ventricle has severely decreased function. The left ventricle demonstrates regional wall motion abnormalities. Definity  contrast agent was given IV to delineate the left ventricular endocardial borders. The left ventricular internal cavity size was mildly dilated. There is no left ventricular hypertrophy. Left ventricular diastolic function could not be evaluated.  LV Wall Scoring: The mid and distal lateral wall, mid and distal anterior septum, mid inferoseptal segment, apical anterior segment, and apical inferior segment are akinetic. The mid anterolateral segment, mid anterior segment, and mid inferior segment are hypokinetic. The basal anteroseptal segment, basal inferolateral segment, basal anterolateral segment, basal anterior segment, basal inferior segment, and basal inferoseptal segment are normal. Right Ventricle: The right ventricular size is normal. No increase in right ventricular wall thickness. Right ventricular systolic function is normal. Left Atrium: Left atrial size was normal in size. Right Atrium: Right atrial size was normal in size. Pericardium: Trivial pericardial effusion is present. Mitral Valve: The mitral valve is grossly normal. Trivial mitral valve regurgitation. No evidence of mitral valve stenosis. Tricuspid Valve: The tricuspid valve is grossly normal. Tricuspid valve regurgitation is trivial. No evidence of tricuspid stenosis. Aortic Valve: The aortic valve is grossly normal. Aortic valve regurgitation is not visualized. No aortic stenosis is  present. Pulmonic Valve: The pulmonic valve was not well visualized. Pulmonic valve regurgitation is trivial. No evidence of pulmonic stenosis. Aorta: The aortic root was not well visualized. Venous: IVC assessment for right atrial pressure unable to be performed due to mechanical ventilation. IAS/Shunts: The interatrial septum was not well visualized.   LV Volumes (MOD) LV vol d, MOD A2C: 119.0 ml LV vol d, MOD A4C: 116.0 ml LV vol s, MOD A2C: 93.7 ml LV vol s, MOD A4C: 100.0 ml LV SV MOD A2C:     25.3 ml LV SV MOD A4C:     116.0 ml LV SV MOD BP:      19.8 ml RIGHT VENTRICLE RV Basal diam:  2.60 cm RV Mid diam:    2.50 cm TAPSE (M-mode): 2.0 cm LEFT ATRIUM  Index        RIGHT ATRIUM           Index LA Vol (A2C):   42.4 ml 25.86 ml/m  RA Area:     10.60 cm LA Vol (A4C):   41.5 ml 25.31 ml/m  RA Volume:   24.90 ml  15.19 ml/m LA Biplane Vol: 42.3 ml 25.80 ml/m  TRICUSPID VALVE TR Peak grad:   18.8 mmHg TR Vmax:        217.00 cm/s Soyla Merck MD Electronically signed by Soyla Merck MD Signature Date/Time: 11/01/2024/12:16:40 AM    Final    US  EKG SITE RITE Result Date: 10/31/2024 If Site Rite image not attached, placement could not be confirmed due to current cardiac rhythm.   Anti-infectives: Anti-infectives (From admission, onward)    Start     Dose/Rate Route Frequency Ordered Stop   10/31/24 1400  piperacillin -tazobactam (ZOSYN ) IVPB 3.375 g        3.375 g 12.5 mL/hr over 240 Minutes Intravenous Every 8 hours 10/31/24 0751     10/31/24 1100  micafungin  (MYCAMINE ) 100 mg in sodium chloride  0.9 % 100 mL IVPB        100 mg 105 mL/hr over 1 Hours Intravenous Every 24 hours 10/31/24 0901     10/30/24 0600  piperacillin -tazobactam (ZOSYN ) IVPB 3.375 g  Status:  Discontinued        3.375 g 12.5 mL/hr over 240 Minutes Intravenous Every 8 hours 10/29/24 2350 10/31/24 0751   10/29/24 2145  piperacillin -tazobactam (ZOSYN ) IVPB 3.375 g        3.375 g 100 mL/hr over 30 Minutes  Intravenous  Once 10/29/24 2137 10/29/24 2205   10/29/24 1700  cefTRIAXone  (ROCEPHIN ) 1 g in sodium chloride  0.9 % 100 mL IVPB  Status:  Discontinued        1 g 200 mL/hr over 30 Minutes Intravenous  Once 10/29/24 1646 10/29/24 1656   10/29/24 1700  metroNIDAZOLE  (FLAGYL ) IVPB 500 mg        500 mg 100 mL/hr over 60 Minutes Intravenous  Once 10/29/24 1646 10/29/24 1809   10/29/24 1700  cefTRIAXone  (ROCEPHIN ) 2 g in sodium chloride  0.9 % 100 mL IVPB        2 g 200 mL/hr over 30 Minutes Intravenous  Once 10/29/24 1656 10/29/24 1733        Assessment/Plan POD 3, s/p ex lap with LOA, g-tube placement in gastric remnant Dr. Vernetta 11/18 secondary to SBO with possible perforation POD 1, s/p ex lap with washout, Dr. Tanda 11/20 -cont pressors and steroids for septic shock per CCM, but pressors weaning -cont zosyn , BC 2/4 positive for E coli.  Continue broad spectrum abx for now given how critically ill patient it. Antifungal coverage added 11/20 -return to OR hopefully tomorrow for abdominal closure -check vitamin levels, B12, B1, A, Co, Fe, Zinc , most still pending. -IV thiamine  for 3 days, completed -hypokalemia improved -cont foley for urinary monitoring -may be able to start using g-tube soon for TFs -plts at 10K this am.  1 pack of plts ordered.  Continue to monitor  FEN - NPO/IVFs per CCM, electrolytes being replaced, may  hold on TNA given we may start using her g-tube soon for TFs VTE - lovenox  on hold due to thrombocytopenia ID - zosyn , micafungin   E coli bacteremia - continue zosyn  Septic shock - secondary to all of the above, pressors are weaning CAD/Heart failure - EF 20-25% on echo yesterday.  D/w CCM.  May get cards to see patient.  Recent CT coronary with significant disease.  Coox pending AKI - resolved DM - SSI Leukopenia - resolved, secondary to sepsis Pancreatitis - lipase 22 today, suspect this is reactive Thrombocytopenia - plts 10K, give 1 pack, follow  labs Hypocalcemia - per CCM PCM - picc/TNA on hold as we may start using her g-tube soon Neck wound - unclear etiology, have asked nursing to place mepilex over this to protect it.  Avoid dressings or lines in this area. L hand ischemic changes - radial pulse not palpable, RN to doppler.  Possible clot secondary to A-line.  D/w CCM, may need VVS  I reviewed nursing notes, Consultant CCM notes, last 24 h vitals and pain scores, last 48 h intake and output, last 24 h labs and trends, and last 24 h imaging results.   LOS: 3 days    Burnard FORBES Banter , [facility] 11/01/2024, 8:32 AM Please see Amion for pager number during day hours 7:00am-4:30pm or 7:00am -11:30am on weekends

## 2024-11-01 NOTE — Consult Note (Signed)
 Hospital Consult    Reason for Consult: Ischemic left hand Requesting Physician: ICU MRN #:  [id number]  History of Present Illness: This is a 58 y.o. female postop day 3 with LOA, G-tube placement and gastric remnant secondary to SBO with possible perforation.  She developed septic shock with blood cultures positive for E. coli bacteremia.  She continues on antibiotics.  Fungal coverage was added yesterday. Patient currently with an open abdomen.  Yesterday the left sided A-line was not working appropriately.  Patient was on 3 pressors.  The radial A-line was pulled.  Radial artery occluded.  This was first appreciated roughly 36 hours ago.  Patient was critical and septic shock.  I was called today regarding the left hand due to mottling at the thumb and first finger.  The mottling was first appreciated yesterday at the time of left radial artery A-line removal.  A left sided axillary artery A-line was placed, but this was pulled when the mottling was appreciated.  Over the last 36 hours, the mottling is worsened.  I was called to assess hand viability, and, possible revascularization.  On exam, Morgan [name] was intubated, sedated.  She was off of pressor medication.  Platelet count 15,000. Her niece was at bedside whom I had a nice conversation with.  Past Medical History:  Diagnosis Date   Allergic rhinitis    Anemia     after gastric bypass in 2018   Anxiety    Barrett's esophagus    Bipolar affective disorder (HCC)    CAP (community acquired pneumonia) 07/20/2018   Chronic respiratory failure (HCC)    Constipation, chronic    Degenerative joint disease of spine    Depression    Diabetes mellitus without complication (HCC)    type 2    DM (diabetes mellitus) (HCC)    hypoglycemic per pt - no longer takes DM meds due to weight loss from gastric bypass in 2018   Dry eye    Family history of adverse reaction to anesthesia    nausea and vomiting   Gastroparesis    GERD  (gastroesophageal reflux disease)    H/O shoulder replacement 08/11/2020   left shoulder   History of bariatric surgery 03/2017   History of hiatal hernia    HLD (hyperlipidemia) 03/12/2013   Hyperlipidemia    Hypertension    No HTN meds since weight loss from Gastric Bypass in 2018   Intractable chronic migraine without aura 06/04/2015   Migraine headache    Morbid obesity (HCC)    OSA (obstructive sleep apnea)    No cpap since gastric surgery   Osteoarthritis    bilateral knee   SBO (small bowel obstruction) (HCC) 03/19/2019   Sleep apnea    Unspecified hypothyroidism    Vitamin B 12 deficiency    Vitamin D  deficiency     Past Surgical History:  Procedure Laterality Date   ANKLE ARTHROSCOPY WITH RECONSTRUCTION Right 09/24/2019   Procedure: ANKLE ARTHROSCOPY DEBRIDEMENT TREATMENT OF OSTEOCHONDRAL LESION TALUS. PRONEAL TENDON DEBRIDEMENT;  Surgeon: Elsa Lonni SAUNDERS, MD;  Location: [facility];  Service: Orthopedics;  Laterality: Right;  SURGERY REQUEST TIME 2 HOURS   APPENDECTOMY  1978   CHOLECYSTECTOMY  2005   COLONOSCOPY     disc repair  05/17/2019   with rods, lumbar spine   GASTRIC ROUX-EN-Y N/A 03/21/2017   Procedure: LAPAROSCOPIC ROUX-EN-Y GASTRIC, UPPER ENDO;  Surgeon: Camellia Blush, MD;  Location: [facility];  Service: General;  Laterality: N/A;   GASTROSTOMY  N/A 06/19/2018   Procedure: LAPRASCOPIC INSERTION OF GASTROSTOMY TUBE;  Surgeon: Tanda Locus, MD;  Location: [facility];  Service: General;  Laterality: N/A;   GASTROSTOMY TUBE PLACEMENT Left    06/2018   HIATAL HERNIA REPAIR N/A 06/19/2018   Procedure: LAPAROSCOPIC REPAIR OF HIATAL HERNIA;  Surgeon: Tanda Locus, MD;  Location: [facility];  Service: General;  Laterality: N/A;   LAPAROSCOPIC LYSIS OF ADHESIONS  03/19/2019   Dr. Mitzie Freund   LAPAROSCOPY N/A 06/19/2018   Procedure: LAPAROSCOPY DIAGNOSTIC;  Surgeon: Tanda Locus, MD;  Location: [facility];  Service: General;  Laterality: N/A;   LAPAROSCOPY N/A  03/19/2019   Procedure: LAPAROSCOPY DIAGNOSTIC lysis of adhesions;  Surgeon: Freund Mitzie LABOR, MD;  Location: [facility];  Service: General;  Laterality: N/A;   LAPAROTOMY N/A 10/29/2024   Procedure: LAPAROTOMY, EXPLORATORY, PLACEMENT OF G-TUBE, WOUND VAC;  Surgeon: Vernetta Berg, MD;  Location: [facility];  Service: General;  Laterality: N/A;   LAPAROTOMY N/A 10/31/2024   Procedure: REEXPLORATION OF ABDOMEN AND CLOSURE OF MESSENTARY DEFECT AND PLACEMENT OF THERA-VAC;  Surgeon: Tanda Locus, MD;  Location: [facility];  Service: General;  Laterality: N/A;  RE-EXPLORATION OF ABDOMEN   LUMBAR LAMINECTOMY/DECOMPRESSION MICRODISCECTOMY Right 10/11/2018   Procedure: LAMINECTOMY AND FORAMINOTOMY RIGHT LUMBAR FOUR- LUMBAR FIVE;  Surgeon: Lanis Pupa, MD;  Location: [facility];  Service: Neurosurgery;  Laterality: Right;   MASS EXCISION Right 09/23/2021   Procedure: EXCISION MASS VOLAR ASPECT RIGHT HAND;  Surgeon: Murrell Drivers, MD;  Location: [facility];  Service: Orthopedics;  Laterality: Right;  45 MIN   SHOULDER ARTHROSCOPY  06/2011   left-dsc   TONSILLECTOMY  at age 16   TOTAL SHOULDER ARTHROPLASTY Left 08/11/2020   Procedure: TOTAL SHOULDER ARTHROPLASTY;  Surgeon: Josefina Chew, MD;  Location: [facility];  Service: Orthopedics;  Laterality: Left;   TRIGGER FINGER RELEASE  12/20/2012   Procedure: RELEASE TRIGGER FINGER/A-1 PULLEY;  Surgeon: Drivers JONELLE Murrell, MD;  Location: [facility];  Service: Orthopedics;  Laterality: Left;  LEFT TRIGGER THUMB RELEASE   TRIGGER FINGER RELEASE Right 09/23/2021   Procedure: RELEASE A-1 PULLEY RIGHT INDEX FINGER;  Surgeon: Murrell Drivers, MD;  Location: [facility];  Service: Orthopedics;  Laterality: Right;    Allergies  Allergen Reactions   Ketoconazole Hives, Swelling and Other (See Comments)    SWELLING REACTION UNSPECIFIED    Pravachol  [Pravastatin  Sodium] Shortness Of Breath, Swelling and Anaphylaxis     Throat swelling   Statins Other (See Comments)    Leg cramps   Tape Dermatitis and Other (See Comments)    Steri-Strips   Repatha  [Evolocumab ] Other (See Comments)    Made the stomach hurt badly.   Codeine Other (See Comments) and Hypertension    Increased B/P    Prior to Admission medications   Medication Sig Start Date End Date Taking? Authorizing Provider  Calcium -Vitamin D -Vitamin K  650-12.5-40 MG-MCG-MCG CHEW Chew 1 each by mouth 2 (two) times daily.   Yes [provider]  Continuous Glucose Sensor (DEXCOM G7 SENSOR) MISC Inject 1 Device into the skin every 14 (fourteen) days.   Yes [provider]  cyclobenzaprine  (FLEXERIL ) 10 MG tablet Take 1 tablet (10 mg total) by mouth 3 (three) times daily as needed for muscle spasms. 06/28/24  Yes Gottschalk, Norene M, DO  desloratadine  (CLARINEX ) 5 MG tablet TAKE ONE TABLET DAILY Patient taking differently: Take 5 mg by mouth See admin instructions. Take 5 mg by mouth once a day  05/13/24  Yes Gottschalk, Ashly M, DO  dicyclomine  (BENTYL ) 20 MG tablet TAKE 1 TABLET EVERY 8 HOURS AS NEEDED FOR ABDOMINAL CRAMPING Patient taking differently: Take 20 mg by mouth every 8 (eight) hours as needed (for abdominal cramping). 12/29/23  Yes Gottschalk, Norene M, DO  Erenumab -aooe 140 MG/ML SOAJ Inject 140 mg into the skin every 30 (thirty) days. 06/24/24  Yes Lomax, Amy, NP  ezetimibe  (ZETIA ) 10 MG tablet TAKE ONE TABLET DAILY 08/13/24  Yes Jolinda Norene M, DO  gabapentin  (NEURONTIN ) 300 MG capsule Takes 2 capsules in morning, one cap at lunch, 2 caps at bedtime Patient taking differently: Take 300-600 mg by mouth See admin instructions. Take 600 mg by mouth in the morning & at bedtime and 300 mg at lunchtime 06/03/24  Yes Lomax, Amy, NP  Glucagon  3 MG/DOSE POWD Place 3 mg into the nose once as needed (for a severe diabetic emergency). 10/03/24  Yes [provider]  levothyroxine  (SYNTHROID ) 112 MCG tablet TAKE ONE TABLET ONCE DAILY  BEFORE BREAKFAST Patient taking differently: Take 112 mcg by mouth daily before breakfast. 07/02/24  Yes Gottschalk, Ashly M, DO  linaclotide  (LINZESS ) 290 MCG CAPS capsule Take 1 capsule (290 mcg total) by mouth daily before breakfast. 07/02/24  Yes Gottschalk, Ashly M, DO  mometasone  (NASONEX ) 50 MCG/ACT nasal spray USE 2 SPRAYS IN EACH NOSTRIL ONCE DAILY (REPLACES FLONASE ) Patient taking differently: Place 2 sprays into the nose daily. 07/30/24  Yes Jolinda Norene M, DO  Multiple Vitamins-Minerals (BARIATRIC MULTIVITAMINS/IRON  PO) Take 1 tablet by mouth daily.   Yes [provider]  MYRBETRIQ  25 MG TB24 tablet TAKE ONE TABLET DAILY 08/13/24  Yes Gottschalk, Ashly M, DO  nystatin  (MYCOSTATIN /NYSTOP ) powder APPLY TO AFFECTED AREA OF GROIN RASH TWICE A DAY FOR 7 TO 10 DAYS PER FLARE Patient taking differently: Apply 1 Application topically See admin instructions. APPLY TO AFFECTED AREA OF GROIN RASH TWICE A DAY FOR 7 TO 10 DAYS, PER FLARES 11/29/23  Yes Gottschalk, Ashly M, DO  pantoprazole  (PROTONIX ) 40 MG tablet TAKE ONE TABLET DAILY Patient taking differently: Take 40 mg by mouth daily before breakfast. 07/22/24  Yes Gottschalk, Ashly M, DO  polyethylene glycol (MIRALAX  / GLYCOLAX ) packet Take 17 g by mouth at bedtime.   Yes [provider]  Probiotic Product (PROBIOTIC DAILY PO) Take 1 capsule by mouth daily.   Yes [provider]  sertraline  (ZOLOFT ) 100 MG tablet TAKE TWO TABLETS DAILY AS DIRECTED Patient taking differently: Take 200 mg by mouth daily. 07/02/24  Yes Gottschalk, Ashly M, DO  Simethicone  (GAS-X EXTRA STRENGTH) 125 MG CAPS Take 125 mg by mouth in the morning, at noon, and at bedtime.   Yes [provider]  ziprasidone  (GEODON ) 60 MG capsule TAKE ONE CAPSULE AT BEDTIME Patient taking differently: Take 60 mg by mouth See admin instructions. Take 60 mg by mouth at bedtime 08/13/24  Yes Gottschalk, Ashly M, DO  Evolocumab  (REPATHA  SURECLICK) 140 MG/ML  SOAJ Inject 140 mg into the skin every 14 (fourteen) days. Patient not taking: Reported on 10/30/2024 09/23/24   Swinyer, Rosaline HERO, NP  Glucagon , rDNA, (GLUCAGON  EMERGENCY IJ) Inject 1 Syringe as directed daily as needed (emergency low blood sugar).     [provider]  glucose blood (ONETOUCH ULTRA) test strip Check BS four times a day Dx E11.9 09/11/23   Jolinda Norene M, DO  metoprolol  tartrate (LOPRESSOR ) 50 MG tablet Take 1 tablet (50 mg total) by mouth once for 1 dose. Take 90-120  minutes prior to scan. Hold for SBP less than 110. Patient not taking: Reported on 10/30/2024 09/23/24 10/30/24  Swinyer, Rosaline HERO, NP  SUMAtriptan  (IMITREX ) 100 MG tablet Take 1 tablet (100 mg total) by mouth once as needed for up to 1 dose for migraine. May repeat in 2 hours if headache persists or recurs. Patient not taking: Reported on 10/30/2024 06/03/24   Cary No, NP    Social History   Socioeconomic History   Marital status: Single    Spouse name: Not on file   Number of children: 0   Years of education: HS   Highest education level: 12th grade  Occupational History   Occupation: disabled    Employer: UNEMPLOYED  Tobacco Use   Smoking status: Never    Passive exposure: Past   Smokeless tobacco: Never  Vaping Use   Vaping status: Never Used  Substance and Sexual Activity   Alcohol  use: No   Drug use: No   Sexual activity: Never    Birth control/protection: Post-menopausal    Comment: LMP 10/2017  Other Topics Concern   Not on file  Social History Narrative   Patient is right handed.   Patient drinks 2 glasses of caffeine  daily.   Lives at home. Her niece is living with her right now.   Social Drivers of Health   Financial Resource Strain: Medium Risk (09/23/2024)   Overall Financial Resource Strain (CARDIA)    Difficulty of Paying Living Expenses: Somewhat hard  Food Insecurity: No Food Insecurity (10/30/2024)   Hunger Vital Sign    Worried About Running Out of  Food in the Last Year: Never true    Ran Out of Food in the Last Year: Never true  Transportation Needs: No Transportation Needs (10/30/2024)   PRAPARE - Administrator, Civil Service (Medical): No    Lack of Transportation (Non-Medical): No  Physical Activity: Insufficiently Active (09/23/2024)   Exercise Vital Sign    Days of Exercise per Week: 3 days    Minutes of Exercise per Session: 20 min  Stress: Stress Concern Present (09/23/2024)   [employer] of Occupational Health - Occupational Stress Questionnaire    Feeling of Stress: Rather much  Social Connections: Moderately Isolated (09/23/2024)   Social Connection and Isolation Panel    Frequency of Communication with Friends and Family: More than three times a week    Frequency of Social Gatherings with Friends and Family: Three times a week    Attends Religious Services: More than 4 times per year    Active Member of Clubs or Organizations: No    Attends Banker Meetings: Never    Marital Status: Never married  Intimate Partner Violence: Not At Risk (10/30/2024)   Humiliation, Afraid, Rape, and Kick questionnaire    Fear of Current or Ex-Partner: No    Emotionally Abused: No    Physically Abused: No    Sexually Abused: No   Family History  Problem Relation Age of Onset   Asthma Father    Allergies Father    Heart disease Father        enlarged heart   Peripheral vascular disease Father    Diabetes Father    Hyperlipidemia Father    Arthritis Father    Asthma Sister    Cancer Sister        colon at 49 yr old.   Colon cancer Sister    Allergies Mother    Stroke Mother 85  with hemi paralysis   Diabetes Mother    Hyperlipidemia Mother    Hypertension Mother    GI problems Mother    Arthritis Mother    Allergies Brother    Early death Brother 74       congenital abormality   Allergies Sister    Diabetes Sister    Asthma Sister    Colon polyps Sister    Hyperlipidemia Sister     GI problems Sister        gastroporesis    Liver disease Sister        fatty liver   Stroke Sister        intercrandial bleed   Diabetes Brother    Hypertension Brother    Hyperlipidemia Brother    Heart disease Paternal Aunt    Migraines Neg Hx     ROS: Otherwise negative unless mentioned in HPI  Physical Examination  Vitals:   11/01/24 1830 11/01/24 1845  BP: (!) 99/53 (!) 102/56  Pulse:    Resp: 15 13  Temp: (!) 95.5 F (35.3 C)   SpO2:     Body mass index is 29.78 kg/m.  General: Critical, ICU, intubated, sedated Gait: Not observed HENT: Intubated Pulmonary: Intubated Cardiac: Soft pressure, tachycardic with normal sinus rhythm Abdomen: Open with VAC Skin: without rashes Vascular Exam/Pulses: Phasic signal in the left ulnar artery, left palm.  No signal in the right radial. Extremities: with ischemic changes, without Gangrene , without cellulitis; without open wounds;   there is capillary thrombosis appreciated on the left hand, indicative of prolonged ischemic time.  The thumb is mottled.  There appears to be some mottling in the index finger as well.  Other fingers with delayed cap refill time.  Musculoskeletal: no muscle wasting or atrophy  Neurologic: Sedated Psychiatric: Not assessed Lymph:  Unremarkable  CBC    Component Value Date/Time   WBC 9.6 11/01/2024 1544   RBC 3.43 (L) 11/01/2024 1544   HGB 10.0 (L) 11/01/2024 1544   HGB 13.4 06/28/2024 0842   HCT 30.2 (L) 11/01/2024 1544   HCT 44.0 06/28/2024 0842   PLT 15 (LL) 11/01/2024 1544   PLT 260 06/28/2024 0842   MCV 88.0 11/01/2024 1544   MCV 97 06/28/2024 0842   MCH 29.2 11/01/2024 1544   MCHC 33.1 11/01/2024 1544   RDW 14.7 11/01/2024 1544   RDW 13.0 06/28/2024 0842   LYMPHSABS 0.4 (L) 10/31/2024 0736   LYMPHSABS 1.4 08/16/2019 0824   MONOABS 0.3 10/31/2024 0736   EOSABS 0.1 10/31/2024 0736   EOSABS 0.1 08/16/2019 0824   BASOSABS 0.0 10/31/2024 0736   BASOSABS 0.0 08/16/2019 0824     BMET    Component Value Date/Time   NA 128 (L) 11/01/2024 0807   NA 142 09/23/2024 1030   K 3.5 11/01/2024 0807   CL 90 (L) 11/01/2024 0450   CO2 28 11/01/2024 0450   GLUCOSE 83 11/01/2024 0450   BUN 28 (H) 11/01/2024 0450   BUN 17 09/23/2024 1030   CREATININE 1.02 (H) 11/01/2024 0450   CREATININE 0.82 06/18/2013 1009   CALCIUM  7.1 (L) 11/01/2024 0450   GFRNONAA >60 11/01/2024 0450   GFRNONAA 87 06/18/2013 1009   GFRAA >60 08/04/2020 1038   GFRAA >89 06/18/2013 1009    COAGS: Lab Results  Component Value Date   INR 1.0 11/01/2024   INR 1.8 (H) 10/31/2024   INR 1.0 10/29/2024     ASSESSMENT/PLAN: This is a 58 y.o. female who remains critical  in the ICU after ex lap for SBO requiring lysis of adhesions and G-tube placement.  Patient with septic shock from E. coli bacteremia.  Currently on broad-spectrum antibiotics.  Need for aggressive pressor requirement, now weaned.  Hand ischemia prolonged-36 hours. Continues to have severe thrombocytopenia with platelet count of 15,000.  Current EF 20%.  I had a long conversation with Morgan [name]'s niece regarding her left upper extremity.  I do not think that the left thumb is viable on physical exam.  Per ICU in the neice, there has been some improvement throughout the day.  We discussed that I cannot take her to the operating room with a platelet count of 15,000 for radial artery thrombectomy as this would lead to hemorrhage.  Furthermore I cannot promise that radial artery thrombectomy will be beneficial, and will likely thrombose again as I am worried that the outflow is now thrombosed in the thumb.  Judging by the distribution of ischemia, I assume that the palmar arch is not intact.  My plan is to reevaluate Morgan [name] in the morning.  I spoke to my general surgery colleagues.  I think that she would be best served with a transfer to [facility].  Should platelet count improve with administration of platelet packs, and she continue to  improve, my plan is to assess the hand in the morning, and possibly perform a radial artery thrombectomy pending physical exam findings.  I think that the thrombectomy will be more important for wound healing after amputation, and less so for salvage of the thumb at this point.  Please perform a new type and screen when the patient arrives to [facility] as this information is not shared between hospitals.  Patient needs multiple platelet backs prior to a.m.  [name] MD MS [facility] [phone] 11/01/2024  7:30 PM

## 2024-11-01 NOTE — Consult Note (Signed)
 Cardiology Consultation   Patient ID: Morgan [name]: [id number]; DOB: 06-15-1966  Admit date: 10/29/2024 Date of Consult: 11/01/2024  PCP:  [name], DO   [facility] Providers Cardiologist:  None  Cardiology APP:  Percy Rosaline HERO, NP       Patient Profile: Morgan [name] is a 58 y.o. female with a history of nonobstructive CAD on coronary CTA in 09/2024, hypertension, hyperlipidemia, type 2 diabetes, GERD with Barrett's esophagus, obstructive sleep apnea, obesity s/p bariatric surgery in 03/2017 with subsequent small bowel obstruction in 2020, anxiety/depression, and bipolar disorder who is being seen 11/01/2024 for the evaluation of new cardiomyopathy at the request of Vina Pesa, NP (Critical Care).  History of Present Illness: Morgan [name] is a 58 year old female with the above history.  She was recently referred to Cardiology in 09/2024 for further evaluation of an elevated coronary calcium  score of 176 (97 percentile for age and sex)  in 07/2024.  She denied any anginal symptoms at that time.  A coronary CTA was ordered further evaluation and showed a coronary calcium  score of 315 (90th percentile for age and sex) and mild mixed nonobstructive CAD with 1-24% stenosis of the proximal RCA and 25-49% stenosis of LAD.  Presented to the [facility] ED on 10/29/2024 via EMS for further evaluation of abdominal pain with nausea/vomiting and was found to have a small bowel obstruction possible perforation and septic shock. She was taken to the OR for exploratory laparotomy was found to have a densities at the end obstructing the proximal small bowel near Roux-en-Y anastomosis.  The adhesions were taken down and a new gastrostomy tube was placed.  Abdomen was left open with a wound VAC.  Postoperatively, she has remained intubated and on vasopressor therapy with Levophed  and Vasopressin .  Blood cultures came back for E. coli.  He continued to run fevers and was  taken back to the OR on 10/31/2024 for reexploration and washout.  Hospitalization complicated by acute hypoxic respiratory failure secondary to left lower lobe pneumonia (suspect aspiration), AKI, multiple electrolyte abnormalities, acute blood loss anemia, severe thrombocytopenia with platelets down to 10,000 chronic transfusion of platelets, and left hand ischemic changes. Echo on 10/31/2024 showed LVEF of 20-25% with multiple wall motion abnormalities which may be consistent with stress cardiomyopathy.  Cardiology consulted for further evaluation.  Patient is currently intubated and sedated. No family is at bedside so history obtained through chart review.   Past Medical History:  Diagnosis Date   Allergic rhinitis    Anemia     after gastric bypass in 2018   Anxiety    Barrett's esophagus    Bipolar affective disorder (HCC)    CAP (community acquired pneumonia) 07/20/2018   Chronic respiratory failure (HCC)    Constipation, chronic    Degenerative joint disease of spine    Depression    Diabetes mellitus without complication (HCC)    type 2    DM (diabetes mellitus) (HCC)    hypoglycemic per pt - no longer takes DM meds due to weight loss from gastric bypass in 2018   Dry eye    Family history of adverse reaction to anesthesia    nausea and vomiting   Gastroparesis    GERD (gastroesophageal reflux disease)    H/O shoulder replacement 08/11/2020   left shoulder   History of bariatric surgery 03/2017   History of hiatal hernia    HLD (hyperlipidemia) 03/12/2013   Hyperlipidemia    Hypertension  No HTN meds since weight loss from Gastric Bypass in 2018   Intractable chronic migraine without aura 06/04/2015   Migraine headache    Morbid obesity (HCC)    OSA (obstructive sleep apnea)    No cpap since gastric surgery   Osteoarthritis    bilateral knee   SBO (small bowel obstruction) (HCC) 03/19/2019   Sleep apnea    Unspecified hypothyroidism    Vitamin B 12 deficiency     Vitamin D  deficiency     Past Surgical History:  Procedure Laterality Date   ANKLE ARTHROSCOPY WITH RECONSTRUCTION Right 09/24/2019   Procedure: ANKLE ARTHROSCOPY DEBRIDEMENT TREATMENT OF OSTEOCHONDRAL LESION TALUS. PRONEAL TENDON DEBRIDEMENT;  Surgeon: Elsa Lonni SAUNDERS, MD;  Location: [facility];  Service: Orthopedics;  Laterality: Right;  SURGERY REQUEST TIME 2 HOURS   APPENDECTOMY  1978   CHOLECYSTECTOMY  2005   COLONOSCOPY     disc repair  05/17/2019   with rods, lumbar spine   GASTRIC ROUX-EN-Y N/A 03/21/2017   Procedure: LAPAROSCOPIC ROUX-EN-Y GASTRIC, UPPER ENDO;  Surgeon: Camellia Blush, MD;  Location: [facility];  Service: General;  Laterality: N/A;   GASTROSTOMY N/A 06/19/2018   Procedure: LAPRASCOPIC INSERTION OF GASTROSTOMY TUBE;  Surgeon: Blush Camellia, MD;  Location: [facility];  Service: General;  Laterality: N/A;   GASTROSTOMY TUBE PLACEMENT Left    06/2018   HIATAL HERNIA REPAIR N/A 06/19/2018   Procedure: LAPAROSCOPIC REPAIR OF HIATAL HERNIA;  Surgeon: Blush Camellia, MD;  Location: [facility];  Service: General;  Laterality: N/A;   LAPAROSCOPIC LYSIS OF ADHESIONS  03/19/2019   Dr. Mitzie Freund   LAPAROSCOPY N/A 06/19/2018   Procedure: LAPAROSCOPY DIAGNOSTIC;  Surgeon: Blush Camellia, MD;  Location: [facility];  Service: General;  Laterality: N/A;   LAPAROSCOPY N/A 03/19/2019   Procedure: LAPAROSCOPY DIAGNOSTIC lysis of adhesions;  Surgeon: Freund Mitzie LABOR, MD;  Location: [facility];  Service: General;  Laterality: N/A;   LAPAROTOMY N/A 10/29/2024   Procedure: LAPAROTOMY, EXPLORATORY, PLACEMENT OF G-TUBE, WOUND VAC;  Surgeon: Vernetta Berg, MD;  Location: [facility];  Service: General;  Laterality: N/A;   LAPAROTOMY N/A 10/31/2024   Procedure: REEXPLORATION OF ABDOMEN AND CLOSURE OF MESSENTARY DEFECT AND PLACEMENT OF THERA-VAC;  Surgeon: Blush Camellia, MD;  Location: [facility];  Service: General;  Laterality: N/A;  RE-EXPLORATION OF ABDOMEN   LUMBAR LAMINECTOMY/DECOMPRESSION  MICRODISCECTOMY Right 10/11/2018   Procedure: LAMINECTOMY AND FORAMINOTOMY RIGHT LUMBAR FOUR- LUMBAR FIVE;  Surgeon: Lanis Pupa, MD;  Location: [facility];  Service: Neurosurgery;  Laterality: Right;   MASS EXCISION Right 09/23/2021   Procedure: EXCISION MASS VOLAR ASPECT RIGHT HAND;  Surgeon: Murrell Drivers, MD;  Location: [facility];  Service: Orthopedics;  Laterality: Right;  45 MIN   SHOULDER ARTHROSCOPY  06/2011   left-dsc   TONSILLECTOMY  at age 5   TOTAL SHOULDER ARTHROPLASTY Left 08/11/2020   Procedure: TOTAL SHOULDER ARTHROPLASTY;  Surgeon: Josefina Chew, MD;  Location: [facility];  Service: Orthopedics;  Laterality: Left;   TRIGGER FINGER RELEASE  12/20/2012   Procedure: RELEASE TRIGGER FINGER/A-1 PULLEY;  Surgeon: Drivers SAUNDERS Murrell, MD;  Location: [facility];  Service: Orthopedics;  Laterality: Left;  LEFT TRIGGER THUMB RELEASE   TRIGGER FINGER RELEASE Right 09/23/2021   Procedure: RELEASE A-1 PULLEY RIGHT INDEX FINGER;  Surgeon: Murrell Drivers, MD;  Location:  SURGERY CENTER;  Service: Orthopedics;  Laterality: Right;       Scheduled Meds:  sodium chloride    Intravenous Once  Chlorhexidine  Gluconate Cloth  6 each Topical Daily   hydrocortisone  sod succinate (SOLU-CORTEF ) inj  100 mg Intravenous BID   insulin  aspart  0-15 Units Subcutaneous Q4H   mouth rinse  15 mL Mouth Rinse Q2H   pantoprazole  (PROTONIX ) IV  40 mg Intravenous Q12H   thiamine  (VITAMIN B1) injection  100 mg Intravenous Q24H   Continuous Infusions:  calcium  gluconate 50 mL/hr at 11/01/24 1103   DOBUTamine      fentaNYL  infusion INTRAVENOUS 25 mcg/hr (11/01/24 1103)   micafungin  (MYCAMINE ) 100 mg in sodium chloride  0.9 % 100 mL IVPB 100 mg (11/01/24 1103)   norepinephrine  (LEVOPHED ) Adult infusion 3 mcg/min (11/01/24 1103)   piperacillin -tazobactam (ZOSYN )  IV Stopped (11/01/24 1050)   propofol  (DIPRIVAN ) infusion 20 mcg/kg/min (11/01/24 1103)   vasopressin   0.04 Units/min (11/01/24 1103)   PRN Meds: acetaminophen , fentaNYL , mouth rinse  Allergies:    Allergies  Allergen Reactions   Ketoconazole Hives, Swelling and Other (See Comments)    SWELLING REACTION UNSPECIFIED    Pravachol  [Pravastatin  Sodium] Shortness Of Breath, Swelling and Anaphylaxis    Throat swelling   Statins Other (See Comments)    Leg cramps   Tape Dermatitis and Other (See Comments)    Steri-Strips   Repatha  [Evolocumab ] Other (See Comments)    Made the stomach hurt badly.   Codeine Other (See Comments) and Hypertension    Increased B/P    Social History:   Social History   Socioeconomic History   Marital status: Single    Spouse name: Not on file   Number of children: 0   Years of education: HS   Highest education level: 12th grade  Occupational History   Occupation: disabled    Associate Professor: UNEMPLOYED  Tobacco Use   Smoking status: Never    Passive exposure: Past   Smokeless tobacco: Never  Vaping Use   Vaping status: Never Used  Substance and Sexual Activity   Alcohol  use: No   Drug use: No   Sexual activity: Never    Birth control/protection: Post-menopausal    Comment: LMP 10/2017  Other Topics Concern   Not on file  Social History Narrative   Patient is right handed.   Patient drinks 2 glasses of caffeine  daily.   Lives at home. Her niece is living with her right now.   Social Drivers of Health   Financial Resource Strain: Medium Risk (09/23/2024)   Overall Financial Resource Strain (CARDIA)    Difficulty of Paying Living Expenses: Somewhat hard  Food Insecurity: No Food Insecurity (10/30/2024)   Hunger Vital Sign    Worried About Running Out of Food in the Last Year: Never true    Ran Out of Food in the Last Year: Never true  Transportation Needs: No Transportation Needs (10/30/2024)   PRAPARE - Administrator, Civil Service (Medical): No    Lack of Transportation (Non-Medical): No  Physical Activity: Insufficiently  Active (09/23/2024)   Exercise Vital Sign    Days of Exercise per Week: 3 days    Minutes of Exercise per Session: 20 min  Stress: Stress Concern Present (09/23/2024)   [employer] of Occupational Health - Occupational Stress Questionnaire    Feeling of Stress: Rather much  Social Connections: Moderately Isolated (09/23/2024)   Social Connection and Isolation Panel    Frequency of Communication with Friends and Family: More than three times a week    Frequency of Social Gatherings with Friends and Family: Three times a week  Attends Religious Services: More than 4 times per year    Active Member of Clubs or Organizations: No    Attends Banker Meetings: Never    Marital Status: Never married  Intimate Partner Violence: Not At Risk (10/30/2024)   Humiliation, Afraid, Rape, and Kick questionnaire    Fear of Current or Ex-Partner: No    Emotionally Abused: No    Physically Abused: No    Sexually Abused: No    Family History:   Family History  Problem Relation Age of Onset   Asthma Father    Allergies Father    Heart disease Father        enlarged heart   Peripheral vascular disease Father    Diabetes Father    Hyperlipidemia Father    Arthritis Father    Asthma Sister    Cancer Sister        colon at 30 yr old.   Colon cancer Sister    Allergies Mother    Stroke Mother 60       with hemi paralysis   Diabetes Mother    Hyperlipidemia Mother    Hypertension Mother    GI problems Mother    Arthritis Mother    Allergies Brother    Early death Brother 81       congenital abormality   Allergies Sister    Diabetes Sister    Asthma Sister    Colon polyps Sister    Hyperlipidemia Sister    GI problems Sister        gastroporesis    Liver disease Sister        fatty liver   Stroke Sister        intercrandial bleed   Diabetes Brother    Hypertension Brother    Hyperlipidemia Brother    Heart disease Paternal Aunt    Migraines Neg Hx      ROS:   Please see the history of present illness.  Review of Systems  Unable to perform ROS: Intubated     Physical Exam/Data: Vitals:   11/01/24 1007 11/01/24 1015 11/01/24 1022 11/01/24 1030  BP:  132/76 115/73 115/73  Pulse:   67   Resp: 13 14 14 13   Temp: (!) 96.3 F (35.7 C) (!) 96.3 F (35.7 C) (!) 96.4 F (35.8 C) (!) 96.3 F (35.7 C)  TempSrc:      SpO2:      Weight:      Height:        Intake/Output Summary (Last 24 hours) at 11/01/2024 1104 Last data filed at 11/01/2024 1103 Gross per 24 hour  Intake 3963.78 ml  Output 2055 ml  Net 1908.78 ml      11/01/2024    5:00 AM 10/29/2024    2:34 PM 09/23/2024    9:23 AM  Last 3 Weights  Weight (lbs) 173 lb 8 oz 132 lb 136 lb 14.4 oz  Weight (kg) 78.7 kg 59.875 kg 62.097 kg     Body mass index is 29.78 kg/m.  General: 58 y.o. female who is intubated and sedated. HEENT: Normocephalic and atraumatic. Sclera clear. EOMs intact. Neck: Supple. No JVD. Heart: RRR.  No murmurs, gallops, or rubs. Radial and distal pedal pulses 2+ and equal bilaterally. Lungs: Intubated. Clear to auscultation anteriorly.  Abdomen: Open abdomen with wound vac in place.  Extremities: No lower extremity edema.  Ischemic changes of left hand. Skin: Cold and dry. Neuro: Intubated and Sedated. Psych: Intubated and Sedated.  EKG:  EKG was personally reviewed and demonstrates: Normal sinus rhythm, rate 61 bpm, with low voltage QRS and mild T wave inversions in pre-cordial leads.  Telemetry:  Telemetry was personally reviewed and demonstrates:  Normal sinus rhythm with rates in the 60s.  Relevant CV Studies:  Coronary CTA 10/02/2024: Impressions: 1. Mild mixed non-obstructive CAD, CADRADS = 1. 2. Normal coronary origin with right dominance. 3. Coronary artery calcium  score is 315, which places the patient in the 98th percentile for age/race and sex-matched controls (MESA). 4. Dilated main pulmonary artery to 35 mm, suggestive of  pulmonary hypertension. 5. Aortic atherosclerosis. _______________  Echocardiogram 10/31/2024: Impressions: 1. Left ventricular ejection fraction, by estimation, is 20 to 25%. The  left ventricle has severely decreased function. The left ventricle  demonstrates regional wall motion abnormalities (see scoring  diagram/findings for description). The left  ventricular internal cavity size was mildly dilated. Left ventricular  diastolic function could not be evaluated.   2. Right ventricular systolic function is normal. The right ventricular  size is normal.   3. The mitral valve is grossly normal. Trivial mitral valve  regurgitation. No evidence of mitral stenosis.   4. The aortic valve is grossly normal. Aortic valve regurgitation is not  visualized. No aortic stenosis is present.   Conclusion(s)/Recommendation(s): Regional wall motion abnormalities may be  consistent with stress cardiomyopathy in the absence of obstructive CAD.    Laboratory Data: High Sensitivity Troponin:  No results for input(s): TROPONINIHS in the last 720 hours.   Chemistry Recent Labs  Lab 10/30/24 0921 10/31/24 0446 10/31/24 0931 10/31/24 1837 11/01/24 0450 11/01/24 0807  NA  --  133*   < > 128* 126* 128*  K  --  2.9*   < > 3.5 3.9 3.5  CL  --  96*  --  91* 90*  --   CO2  --  27  --  30 28  --   GLUCOSE  --  136*  --  65* 83  --   BUN  --  26*  --  25* 28*  --   CREATININE  --  1.12*  --  0.99 1.02*  --   CALCIUM   --  6.3*  --  7.0* 7.1*  --   MG 1.5* 2.0  --   --  2.1  --   GFRNONAA  --  57*  --  >60 >60  --   ANIONGAP  --  9  --  8 8  --    < > = values in this interval not displayed.    Recent Labs  Lab 10/29/24 1541 10/30/24 1141 11/01/24 0450 11/01/24 0920  PROT 5.6* 3.4* 4.3*  --   ALBUMIN  3.4* 2.2* 2.1*  --   AST 383* 123* 124*  --   ALT 272* 130* 103*  --   ALKPHOS 127* 48 62  --   BILITOT 0.3 <0.2 0.7 0.7   Lipids  Recent Labs  Lab 10/31/24 0446  TRIG 120     Hematology Recent Labs  Lab 10/31/24 0736 10/31/24 0931 10/31/24 0945 10/31/24 1407 11/01/24 0450 11/01/24 0807 11/01/24 0920  WBC 11.6*  --   --  12.7* 14.3*  --   --   RBC 4.28  --   --  3.91 3.80*  --  3.72*  HGB 12.4   < >  --  11.3* 11.0* 11.2*  --   HCT 38.0   < >  --  33.3* 32.6* 33.0*  --  MCV 88.8  --   --  85.2 85.8  --   --   MCH 29.0  --   --  28.9 28.9  --   --   MCHC 32.6  --   --  33.9 33.7  --   --   RDW 15.4  --   --  14.7 14.6  --   --   PLT 45*  --  19* 37* 10*  --   --    < > = values in this interval not displayed.   Thyroid  No results for input(s): TSH, FREET4 in the last 168 hours.  BNP Recent Labs  Lab 11/01/24 0920  PROBNP >35,000.0*    DDimer  Recent Labs  Lab 10/31/24 0945  DDIMER 6.22*    Radiology/Studies:  US  EKG SITE RITE Result Date: 11/01/2024 If Site Rite image not attached, placement could not be confirmed due to current cardiac rhythm.  ECHOCARDIOGRAM COMPLETE Result Date: 11/01/2024    ECHOCARDIOGRAM REPORT   Patient Name:   TAIYANA KISSLER [facility] Date of Exam: 10/31/2024 Medical Rec #:  [id number]        Height:       64.0 in Accession #:    [id number]       Weight:       132.0 lb Date of Birth:  04/14/1966        BSA:          1.640 m Patient Age:    58 years         BP:           130//80 mmHg Patient Gender: F                HR:           93 bpm. Exam Location:  Inpatient Procedure: 2D Echo and Intracardiac Opacification Agent (Both Spectral and Color            Flow Doppler were utilized during procedure). Indications:    Bacteremia  History:        Patient has prior history of Echocardiogram examinations.  Sonographer:    Charmaine Gaskins Referring Phys: [phone] KELLY OSBORNE  Sonographer Comments: Technically difficult study due to poor echo windows, Technically challenging study due to limited acoustic windows and echo performed with patient supine and on artificial respirator. IMPRESSIONS  1. Left ventricular ejection fraction, by  estimation, is 20 to 25%. The left ventricle has severely decreased function. The left ventricle demonstrates regional wall motion abnormalities (see scoring diagram/findings for description). The left ventricular internal cavity size was mildly dilated. Left ventricular diastolic function could not be evaluated.  2. Right ventricular systolic function is normal. The right ventricular size is normal.  3. The mitral valve is grossly normal. Trivial mitral valve regurgitation. No evidence of mitral stenosis.  4. The aortic valve is grossly normal. Aortic valve regurgitation is not visualized. No aortic stenosis is present. Conclusion(s)/Recommendation(s): Regional wall motion abnormalities may be consistent with stress cardiomyopathy in the absence of obstructive CAD. FINDINGS  Left Ventricle: Left ventricular ejection fraction, by estimation, is 20 to 25%. The left ventricle has severely decreased function. The left ventricle demonstrates regional wall motion abnormalities. Definity  contrast agent was given IV to delineate the left ventricular endocardial borders. The left ventricular internal cavity size was mildly dilated. There is no left ventricular hypertrophy. Left ventricular diastolic function could not be evaluated.  LV Wall Scoring: The mid and distal lateral wall, mid and distal anterior  septum, mid inferoseptal segment, apical anterior segment, and apical inferior segment are akinetic. The mid anterolateral segment, mid anterior segment, and mid inferior segment are hypokinetic. The basal anteroseptal segment, basal inferolateral segment, basal anterolateral segment, basal anterior segment, basal inferior segment, and basal inferoseptal segment are normal. Right Ventricle: The right ventricular size is normal. No increase in right ventricular wall thickness. Right ventricular systolic function is normal. Left Atrium: Left atrial size was normal in size. Right Atrium: Right atrial size was normal in size.  Pericardium: Trivial pericardial effusion is present. Mitral Valve: The mitral valve is grossly normal. Trivial mitral valve regurgitation. No evidence of mitral valve stenosis. Tricuspid Valve: The tricuspid valve is grossly normal. Tricuspid valve regurgitation is trivial. No evidence of tricuspid stenosis. Aortic Valve: The aortic valve is grossly normal. Aortic valve regurgitation is not visualized. No aortic stenosis is present. Pulmonic Valve: The pulmonic valve was not well visualized. Pulmonic valve regurgitation is trivial. No evidence of pulmonic stenosis. Aorta: The aortic root was not well visualized. Venous: IVC assessment for right atrial pressure unable to be performed due to mechanical ventilation. IAS/Shunts: The interatrial septum was not well visualized.   LV Volumes (MOD) LV vol d, MOD A2C: 119.0 ml LV vol d, MOD A4C: 116.0 ml LV vol s, MOD A2C: 93.7 ml LV vol s, MOD A4C: 100.0 ml LV SV MOD A2C:     25.3 ml LV SV MOD A4C:     116.0 ml LV SV MOD BP:      19.8 ml RIGHT VENTRICLE RV Basal diam:  2.60 cm RV Mid diam:    2.50 cm TAPSE (M-mode): 2.0 cm LEFT ATRIUM             Index        RIGHT ATRIUM           Index LA Vol (A2C):   42.4 ml 25.86 ml/m  RA Area:     10.60 cm LA Vol (A4C):   41.5 ml 25.31 ml/m  RA Volume:   24.90 ml  15.19 ml/m LA Biplane Vol: 42.3 ml 25.80 ml/m  TRICUSPID VALVE TR Peak grad:   18.8 mmHg TR Vmax:        217.00 cm/s Soyla Merck MD Electronically signed by Soyla Merck MD Signature Date/Time: 11/01/2024/12:16:40 AM    Final    US  EKG SITE RITE Result Date: 10/31/2024 If Site Rite image not attached, placement could not be confirmed due to current cardiac rhythm.  DG CHEST PORT 1 VIEW Result Date: 10/30/2024 EXAM: 1 VIEW(S) XRAY OF THE CHEST 10/30/2024 12:22:00 AM COMPARISON: 10/29/2024 CLINICAL HISTORY: Intubation of airway performed without difficulty. FINDINGS: LINES, TUBES AND DEVICES: Endotracheal tube in place with tip 4 cm above the carina. Right  IJ central venous catheter in place with tip overlying the cavoatrial junction region. LUNGS AND PLEURA: Low lung volume. Hazy opacities in left lower lung zone, likely representing atelectasis and/or pneumonia. Small left pleural effusion. No pneumothorax. HEART AND MEDIASTINUM: No acute abnormality of the cardiac and mediastinal silhouettes. BONES AND SOFT TISSUES: Left shoulder arthroplasty noted. Surgical clips in right upper quadrant, consistent with prior cholecystectomy. IMPRESSION: 1. Hazy opacities in the left lower lung zone, likely representing atelectasis and/or pneumonia. 2. Small left pleural effusion. Electronically signed by: Morgane Naveau MD 10/30/2024 12:32 AM EST RP Workstation: [id number]   CT ABDOMEN PELVIS W CONTRAST Result Date: 10/29/2024 CLINICAL DATA:  Mid abdominal pain, nausea, and vomiting EXAM: CT ABDOMEN AND PELVIS WITH CONTRAST TECHNIQUE:  Multidetector CT imaging of the abdomen and pelvis was performed using the standard protocol following bolus administration of intravenous contrast. RADIATION DOSE REDUCTION: This exam was performed according to the departmental dose-optimization program which includes automated exposure control, adjustment of the mA and/or kV according to patient size and/or use of iterative reconstruction technique. CONTRAST:  OMNIPAQUE  IOHEXOL  300 MG/ML  SOLN COMPARISON:  CT abdomen and pelvis dated 03/19/2019 FINDINGS: Lower chest: Bilateral lower lobe subsegmental atelectasis. Trace bilateral pleural effusions. Partially imaged heart size is normal. Hepatobiliary: Atrophic left hepatic lobe. No intra or extrahepatic biliary ductal dilation. Cholecystectomy. Pancreas: No focal lesions or main ductal dilation. Spleen: 9 mm hypodensity in the spleen (2:29), likely benign cyst. Adrenals/Urinary Tract: No adrenal nodules. No suspicious renal mass, calculi or hydronephrosis. 10 mm minimally complicated cyst in the anterior interpolar left kidney (11:12). No  focal bladder wall thickening. Stomach/Bowel: Postsurgical changes of Roux-en-Y gastric bypass. Marked dilation of the excluded stomach and duodenum to the level of the duodenojejunal junction, where there is abrupt luminal caliber transition (9:118). There is also apparent tethering of the gastric body at this level (7:50) as well as marked luminal narrowing of the transverse colon (2:42). Mural thickening and enhancement of the additional small bowel loops throughout the abdomen, many of which are underdistended, predominantly in the left lower quadrant. Distal transverse, descending, and rectosigmoid colon are underdistended. Appendix is not discretely seen. Vascular/Lymphatic: Aortic atherosclerosis. No enlarged abdominal or pelvic lymph nodes. Reproductive: Subtly delineated right adnexal cystic structure measures 3.4 cm (2:74). No left adnexal mass. Other: Moderate volume ascites.  No free air. Musculoskeletal: No acute or abnormal lytic or blastic osseous lesions. Postsurgical changes of L3-5 spinal fusion. Hardware appears intact. Multilevel degenerative changes of the partially imaged thoracic and lumbar spine. IMPRESSION: 1. Marked dilation of the excluded stomach and duodenum to the level of the duodenojejunal junction, where there is abrupt luminal caliber transition associated with apparent tethering of the gastric body as well as marked luminal narrowing of the transverse colon. Findings are suspicious for obstruction of the excluded stomach and duodenum, likely secondary to adhesions. Of note, an adhesion was demonstrated causing obstruction in this area when the patient last presented with similar CT appearance on 03/19/2019. Recommend surgical consultation. 2. Mural thickening and enhancement of the additional small bowel loops throughout the abdomen, predominantly in the left lower quadrant, which may be reactive or reflect enteritis. 3. Moderate volume ascites. 4. Subtly delineated right adnexal  cystic structure measures 3.4 cm. Recommend nonemergent pelvic ultrasound for further evaluation. 5. Trace bilateral pleural effusions. 6.  Aortic Atherosclerosis (ICD10-I70.0). Electronically Signed   By: Limin  Xu M.D.   On: 10/29/2024 19:17   DG Abdomen Acute W/Chest Result Date: 10/29/2024 EXAM: UPRIGHT AND SUPINE XRAY VIEWS OF THE ABDOMEN AND 4 VIEW(S) OF THE CHEST 10/29/2024 04:31:00 PM COMPARISON: 07/21/2018 CLINICAL HISTORY: vomiting with epigastric abdominal pain FINDINGS: LUNGS AND PLEURA: No consolidation or pulmonary edema. No pleural effusion or pneumothorax. HEART AND MEDIASTINUM: No acute abnormality of the cardiac and mediastinal silhouettes. BOWEL: The bowel gas pattern is nonspecific. No bowel obstruction. PERITONEUM AND SOFT TISSUES: Status post cholecystectomy. Possible free air is noted along the left abdominal wall versus air-filled colon. CT scan is recommended for further evaluation. No abnormal calcifications. BONES: No acute osseous abnormality. IMPRESSION: 1. Possible free intraperitoneal air along the left abdominal wall versus air within the colon. 2. CT abdomen and pelvis is recommended for further evaluation. Electronically signed by: Lynwood Seip MD 10/29/2024  04:46 PM EST RP Workstation: [id number]     Assessment and Plan:  New Cardiomyopathy - Suspect Stress-Induced Cardiomyopathy Shock - Septic +/- Cardiogenic  Patient was admitted with small bowel obstruction and septic shock. Patient underwent exploratory laparotomy exploratory laparotomy was found to have a densities at the end obstructing the proximal small bowel near Roux-en-Y anastomosis.  The adhesions were taken down and a new gastrostomy tube was placed.  Abdomen was left open with a wound VAC.  He was taken back to the OR on 10/31/2024 for reexploration and washout.  Echo showed LVEF of 20-25% with multiple wall motion abnormalities (akinesis of the mid and distal lateral wall, mid and distal anterior septum,  mid inferoseptal segment, apical anterior segment, and apical inferior segment as well as hypokinesis of the mid anterolateral segment, mid anterior segment, and mid inferior segment). Pr BNP >35,000.  - She does look volume overloaded on exam. However, she is very cold on exam. CVP 11 this morning. - CO-OX has been running at least >70. CO-OX at 8:07 was 98 but repeat at 9:20 was 49.2. Suspect this may not be right although she is very cold on exam. Will repeat this. Discussed with Dr. Azobou and given how cold she is on exam, will go ahead and start Dobutamine  2.5mcg/kg/hr. - Currently on Levophed  5mcg/min and Vasopressin  12 mL/hr. Will defer this to PCCM.  - Unable to add any GDMT given shock.  - Suspect new cardiomyopathy is likely stress-induced given she had a coronary CTA last month that showed only mild non-obstructive CAD. No plans for repeat ischemic evaluation right now given her acute illness including open abdomen and severe thrombocytopenia with platelets of 10,000.  Non-Obstructive CAD Coronary CTA in 09/2024 showed coronary calcium  score of 315 (90th percentile for age and sex) and mild mixed nonobstructive CAD with 1-24% stenosis of the proximal RCA and 25-49% stenosis of LAD. - No Aspirin  given severe thrombocytopenia.  - Intolerant to statins in the past.   Elevated Troponin  High-sensitivity troponin 555 this morning. Repeat pending. EKG shows mild T wave inversions in pre-cordial leads. Echo shows newly reduced EF and multiple wall motion abnormalities as above. - Suspect troponin elevation is due to demand ischemia in setting of acute illness rather than ACS. She is not a candidate for IV Heparin  or cardiac catheterization right now with open abdomen and severe thrombocytopenia.   Ischemic Left Hand Patient noted to have ischemic changes to left hand today. Radial pulses were not palpable. There is concern that this was due to the arterial line which was removed on 11/20.  -  Vascular surgery has been consulted.  Otherwise, per primary team: - Small bowel obstruction s/p exploratory laparotomy - Pancreatitis - Elevated LFTs (likely shock liver) - Acute hypoxic respiratory failure - Left lower lobe pneumonia - Type 2 diabetes mellitus - AKI - Multiple electrolyte abnormalities    For questions or updates, please contact [facility] Please consult [email or web address] for contact info under      Signed, Deseray Daponte E Ardyn Forge, PA-C  11/01/2024 11:04 AM

## 2024-11-01 NOTE — Plan of Care (Signed)
  Problem: Elimination: Goal: Will not experience complications related to urinary retention Outcome: Progressing   Problem: Education: Goal: Knowledge of General Education information will improve Description: Including pain rating scale, medication(s)/side effects and non-pharmacologic comfort measures Outcome: Not Progressing   Problem: Health Behavior/Discharge Planning: Goal: Ability to manage health-related needs will improve Outcome: Not Progressing   Problem: Elimination: Goal: Will not experience complications related to bowel motility Outcome: Not Progressing   Problem: Activity: Goal: Ability to tolerate increased activity will improve Outcome: Not Progressing   Problem: Role Relationship: Goal: Method of communication will improve Outcome: Not Progressing   Problem: Health Behavior/Discharge Planning: Goal: Ability to identify and utilize available resources and services will improve Outcome: Not Progressing   Problem: Skin Integrity: Goal: Risk for impaired skin integrity will decrease Outcome: Not Progressing   Problem: Tissue Perfusion: Goal: Adequacy of tissue perfusion will improve Outcome: Not Progressing

## 2024-11-02 ENCOUNTER — Inpatient Hospital Stay (HOSPITAL_COMMUNITY)

## 2024-11-02 ENCOUNTER — Encounter (HOSPITAL_COMMUNITY): Admission: EM | Disposition: A | Discharge status: Skilled Nursing Facility | Payer: Self-pay | Source: Home / Self Care

## 2024-11-02 ENCOUNTER — Inpatient Hospital Stay (HOSPITAL_COMMUNITY): Admitting: Registered Nurse

## 2024-11-02 ENCOUNTER — Encounter (HOSPITAL_COMMUNITY)
Admission: EM | Disposition: A | Discharge status: Skilled Nursing Facility | Payer: Self-pay | Source: Home / Self Care | Attending: Internal Medicine

## 2024-11-02 DIAGNOSIS — K859 Acute pancreatitis without necrosis or infection, unspecified: Secondary | ICD-10-CM | POA: Diagnosis not present

## 2024-11-02 DIAGNOSIS — E039 Hypothyroidism, unspecified: Secondary | ICD-10-CM | POA: Diagnosis not present

## 2024-11-02 DIAGNOSIS — I5020 Unspecified systolic (congestive) heart failure: Secondary | ICD-10-CM | POA: Diagnosis not present

## 2024-11-02 DIAGNOSIS — I5021 Acute systolic (congestive) heart failure: Secondary | ICD-10-CM

## 2024-11-02 DIAGNOSIS — I742 Embolism and thrombosis of arteries of the upper extremities: Secondary | ICD-10-CM | POA: Diagnosis not present

## 2024-11-02 DIAGNOSIS — E119 Type 2 diabetes mellitus without complications: Secondary | ICD-10-CM | POA: Diagnosis not present

## 2024-11-02 DIAGNOSIS — R7989 Other specified abnormal findings of blood chemistry: Secondary | ICD-10-CM

## 2024-11-02 DIAGNOSIS — K56609 Unspecified intestinal obstruction, unspecified as to partial versus complete obstruction: Secondary | ICD-10-CM | POA: Diagnosis not present

## 2024-11-02 DIAGNOSIS — D696 Thrombocytopenia, unspecified: Secondary | ICD-10-CM | POA: Diagnosis not present

## 2024-11-02 DIAGNOSIS — B9620 Unspecified Escherichia coli [E. coli] as the cause of diseases classified elsewhere: Secondary | ICD-10-CM

## 2024-11-02 DIAGNOSIS — A4151 Sepsis due to Escherichia coli [E. coli]: Secondary | ICD-10-CM | POA: Diagnosis not present

## 2024-11-02 DIAGNOSIS — E440 Moderate protein-calorie malnutrition: Secondary | ICD-10-CM | POA: Diagnosis not present

## 2024-11-02 DIAGNOSIS — I10 Essential (primary) hypertension: Secondary | ICD-10-CM

## 2024-11-02 DIAGNOSIS — R7401 Elevation of levels of liver transaminase levels: Secondary | ICD-10-CM | POA: Diagnosis not present

## 2024-11-02 DIAGNOSIS — E8729 Other acidosis: Secondary | ICD-10-CM

## 2024-11-02 DIAGNOSIS — R578 Other shock: Secondary | ICD-10-CM | POA: Diagnosis not present

## 2024-11-02 HISTORY — PX: LAPAROTOMY: SHX154

## 2024-11-02 HISTORY — PX: THROMBECTOMY BRACHIAL ARTERY: SHX6649

## 2024-11-02 HISTORY — PX: WOUND DEBRIDEMENT: SHX247

## 2024-11-02 HISTORY — PX: APPLICATION OF WOUND VAC: SHX5189

## 2024-11-02 LAB — COMPREHENSIVE METABOLIC PANEL WITH GFR
ALT: 72 U/L — ABNORMAL HIGH (ref 0–44)
ALT: 72 U/L — ABNORMAL HIGH (ref 0–44)
AST: 78 U/L — ABNORMAL HIGH (ref 15–41)
AST: 78 U/L — ABNORMAL HIGH (ref 15–41)
Albumin: <1.5 g/dL — ABNORMAL LOW (ref 3.5–5.0)
Albumin: <1.5 g/dL — ABNORMAL LOW (ref 3.5–5.0)
Alkaline Phosphatase: 44 U/L (ref 38–126)
Alkaline Phosphatase: 44 U/L (ref 38–126)
Anion gap: 11 (ref 5–15)
Anion gap: 11 (ref 5–15)
BUN: 34 mg/dL — ABNORMAL HIGH (ref 6–20)
BUN: 34 mg/dL — ABNORMAL HIGH (ref 6–20)
CO2: 30 mmol/L (ref 22–32)
CO2: 30 mmol/L (ref 22–32)
Calcium: 6.9 mg/dL — ABNORMAL LOW (ref 8.9–10.3)
Calcium: 6.9 mg/dL — ABNORMAL LOW (ref 8.9–10.3)
Chloride: 88 mmol/L — ABNORMAL LOW (ref 98–111)
Chloride: 88 mmol/L — ABNORMAL LOW (ref 98–111)
Creatinine, Ser: 1.13 mg/dL — ABNORMAL HIGH (ref 0.44–1.00)
Creatinine, Ser: 1.13 mg/dL — ABNORMAL HIGH (ref 0.44–1.00)
GFR, Estimated: 57 mL/min — ABNORMAL LOW (ref >60)
GFR, Estimated: 57 mL/min — ABNORMAL LOW (ref >60)
Glucose, Bld: 95 mg/dL (ref 70–99)
Glucose, Bld: 95 mg/dL (ref 70–99)
Potassium: 3.6 mmol/L (ref 3.5–5.1)
Potassium: 3.6 mmol/L (ref 3.5–5.1)
Sodium: 129 mmol/L — ABNORMAL LOW (ref 135–145)
Sodium: 129 mmol/L — ABNORMAL LOW (ref 135–145)
Total Bilirubin: 0.8 mg/dL (ref 0.0–1.2)
Total Bilirubin: 0.8 mg/dL (ref 0.0–1.2)
Total Protein: 3.8 g/dL — ABNORMAL LOW (ref 6.5–8.1)
Total Protein: 3.8 g/dL — ABNORMAL LOW (ref 6.5–8.1)

## 2024-11-02 LAB — COOXEMETRY PANEL
Carboxyhemoglobin: 1.2 % (ref 0.5–1.5)
Methemoglobin: <0.7 % (ref 0.0–1.5)
O2 Saturation: 80.5 %
Total hemoglobin: 9.8 g/dL — ABNORMAL LOW (ref 12.0–16.0)

## 2024-11-02 LAB — BASIC METABOLIC PANEL WITH GFR
Anion gap: 17 — ABNORMAL HIGH (ref 5–15)
BUN: 35 mg/dL — ABNORMAL HIGH (ref 6–20)
CO2: 25 mmol/L (ref 22–32)
Calcium: 7.2 mg/dL — ABNORMAL LOW (ref 8.9–10.3)
Chloride: 90 mmol/L — ABNORMAL LOW (ref 98–111)
Creatinine, Ser: 1.07 mg/dL — ABNORMAL HIGH (ref 0.44–1.00)
GFR, Estimated: >60 mL/min (ref >60)
Glucose, Bld: 76 mg/dL (ref 70–99)
Potassium: 3.7 mmol/L (ref 3.5–5.1)
Sodium: 132 mmol/L — ABNORMAL LOW (ref 135–145)

## 2024-11-02 LAB — POCT I-STAT EG7
Acid-Base Excess: 7 mmol/L — ABNORMAL HIGH (ref 0.0–2.0)
Acid-Base Excess: 7 mmol/L — ABNORMAL HIGH (ref 0.0–2.0)
Bicarbonate: 33.2 mmol/L — ABNORMAL HIGH (ref 20.0–28.0)
Bicarbonate: 32.7 mmol/L — ABNORMAL HIGH (ref 20.0–28.0)
Calcium, Ion: 0.98 mmol/L — ABNORMAL LOW (ref 1.15–1.40)
Calcium, Ion: 0.99 mmol/L — ABNORMAL LOW (ref 1.15–1.40)
HCT: 30 % — ABNORMAL LOW (ref 36.0–46.0)
HCT: 31 % — ABNORMAL LOW (ref 36.0–46.0)
Hemoglobin: 10.2 g/dL — ABNORMAL LOW (ref 12.0–15.0)
Hemoglobin: 10.5 g/dL — ABNORMAL LOW (ref 12.0–15.0)
O2 Saturation: 76 %
O2 Saturation: 78 %
Potassium: 3.4 mmol/L — ABNORMAL LOW (ref 3.5–5.1)
Potassium: 3.5 mmol/L (ref 3.5–5.1)
Sodium: 131 mmol/L — ABNORMAL LOW (ref 135–145)
Sodium: 132 mmol/L — ABNORMAL LOW (ref 135–145)
TCO2: 35 mmol/L — ABNORMAL HIGH (ref 22–32)
TCO2: 34 mmol/L — ABNORMAL HIGH (ref 22–32)
pCO2, Ven: 52.8 mmHg (ref 44–60)
pCO2, Ven: 53.7 mmHg (ref 44–60)
pH, Ven: 7.406 (ref 7.25–7.43)
pH, Ven: 7.394 (ref 7.25–7.43)
pO2, Ven: 41 mmHg (ref 32–45)
pO2, Ven: 44 mmHg (ref 32–45)

## 2024-11-02 LAB — CBC
HCT: 29.2 % — ABNORMAL LOW (ref 36.0–46.0)
HCT: 29.5 % — ABNORMAL LOW (ref 36.0–46.0)
Hemoglobin: 9.8 g/dL — ABNORMAL LOW (ref 12.0–15.0)
Hemoglobin: 9.8 g/dL — ABNORMAL LOW (ref 12.0–15.0)
MCH: 28.7 pg (ref 26.0–34.0)
MCH: 28.6 pg (ref 26.0–34.0)
MCHC: 33.6 g/dL (ref 30.0–36.0)
MCHC: 33.2 g/dL (ref 30.0–36.0)
MCV: 85.6 fL (ref 80.0–100.0)
MCV: 86 fL (ref 80.0–100.0)
Platelets: 44 K/uL — ABNORMAL LOW (ref 150–400)
Platelets: 84 K/uL — ABNORMAL LOW (ref 150–400)
RBC: 3.41 MIL/uL — ABNORMAL LOW (ref 3.87–5.11)
RBC: 3.43 MIL/uL — ABNORMAL LOW (ref 3.87–5.11)
RDW: 14.6 % (ref 11.5–15.5)
RDW: 14.8 % (ref 11.5–15.5)
WBC: 13.7 K/uL — ABNORMAL HIGH (ref 4.0–10.5)
WBC: 12.9 K/uL — ABNORMAL HIGH (ref 4.0–10.5)
nRBC: 0.1 % (ref 0.0–0.2)
nRBC: 0.2 % (ref 0.0–0.2)

## 2024-11-02 LAB — TRIGLYCERIDES: Triglycerides: 429 mg/dL — ABNORMAL HIGH (ref <150)

## 2024-11-02 LAB — DIC (DISSEMINATED INTRAVASCULAR COAGULATION)PANEL
D-Dimer, Quant: 3.88 ug{FEU}/mL — ABNORMAL HIGH (ref 0.00–0.50)
Fibrinogen: 593 mg/dL — ABNORMAL HIGH (ref 210–475)
INR: 1 (ref 0.8–1.2)
Platelets: 8 K/uL — CL (ref 150–400)
Prothrombin Time: 13.3 s (ref 11.4–15.2)
Smear Review: NONE SEEN
aPTT: 29 s (ref 24–36)

## 2024-11-02 LAB — GLUCOSE, CAPILLARY
Glucose-Capillary: 102 mg/dL — ABNORMAL HIGH (ref 70–99)
Glucose-Capillary: 128 mg/dL — ABNORMAL HIGH (ref 70–99)
Glucose-Capillary: 132 mg/dL — ABNORMAL HIGH (ref 70–99)
Glucose-Capillary: 50 mg/dL — ABNORMAL LOW (ref 70–99)
Glucose-Capillary: 76 mg/dL (ref 70–99)
Glucose-Capillary: 80 mg/dL (ref 70–99)
Glucose-Capillary: 85 mg/dL (ref 70–99)
Glucose-Capillary: 88 mg/dL (ref 70–99)
Glucose-Capillary: 89 mg/dL (ref 70–99)
Glucose-Capillary: 91 mg/dL (ref 70–99)
Glucose-Capillary: 92 mg/dL (ref 70–99)

## 2024-11-02 LAB — VITAMIN A: Vitamin A (Retinoic Acid): 9.9 ug/dL — ABNORMAL LOW (ref 20.1–62.0)

## 2024-11-02 LAB — HEPARIN LEVEL (UNFRACTIONATED): Heparin Unfractionated: <0.1 [IU]/mL — ABNORMAL LOW (ref 0.30–0.70)

## 2024-11-02 LAB — CALCIUM, IONIZED: Calcium, Ionized, Serum: 4.1 mg/dL — ABNORMAL LOW (ref 4.5–5.6)

## 2024-11-02 LAB — PROTIME-INR
INR: 0.9 (ref 0.8–1.2)
INR: 0.9 (ref 0.8–1.2)
Prothrombin Time: 12.7 s (ref 11.4–15.2)
Prothrombin Time: 12.2 s (ref 11.4–15.2)

## 2024-11-02 LAB — ZINC: Zinc: 15 ug/dL — ABNORMAL LOW (ref 44–115)

## 2024-11-02 LAB — MAGNESIUM
Magnesium: 2.1 mg/dL (ref 1.7–2.4)
Magnesium: 2.1 mg/dL (ref 1.7–2.4)

## 2024-11-02 LAB — MRSA NEXT GEN BY PCR, NASAL: MRSA by PCR Next Gen: NOT DETECTED

## 2024-11-02 LAB — LACTIC ACID, PLASMA: Lactic Acid, Venous: 1.1 mmol/L (ref 0.5–1.9)

## 2024-11-02 LAB — PHOSPHORUS
Phosphorus: 5.8 mg/dL — ABNORMAL HIGH (ref 2.5–4.6)
Phosphorus: 5.8 mg/dL — ABNORMAL HIGH (ref 2.5–4.6)

## 2024-11-02 SURGERY — LAPAROTOMY, EXPLORATORY
Anesthesia: General

## 2024-11-02 MED ORDER — HEPARIN 6000 UNIT IRRIGATION SOLUTION
Status: AC
  Filled 2024-11-02: qty 500

## 2024-11-02 MED ORDER — 0.9 % SODIUM CHLORIDE (POUR BTL) OPTIME
TOPICAL | Status: DC | PRN
  Administered 2024-11-02: 3000 mL

## 2024-11-02 MED ORDER — POTASSIUM CHLORIDE 10 MEQ/100ML IV SOLN
10.0000 meq | INTRAVENOUS | Status: DC
  Administered 2024-11-02 (×4): 10 meq via INTRAVENOUS
  Filled 2024-11-02 (×2): qty 100

## 2024-11-02 MED ORDER — STERILE WATER FOR INJECTION IJ SOLN
INTRAMUSCULAR | Status: AC
  Filled 2024-11-02: qty 10

## 2024-11-02 MED ORDER — SODIUM CHLORIDE 0.9 % IV SOLN
20.0000 ug | Freq: Once | INTRAVENOUS | Status: AC
  Administered 2024-11-02: 20 ug via INTRAVENOUS
  Filled 2024-11-02: qty 5

## 2024-11-02 MED ORDER — HEPARIN (PORCINE) 25000 UT/250ML-% IV SOLN
1950.0000 [IU]/h | INTRAVENOUS | Status: DC
  Administered 2024-11-02: 500 [IU]/h via INTRAVENOUS
  Administered 2024-11-04: 700 [IU]/h via INTRAVENOUS
  Administered 2024-11-05: 1150 [IU]/h via INTRAVENOUS
  Administered 2024-11-06: 1400 [IU]/h via INTRAVENOUS
  Administered 2024-11-07: 1600 [IU]/h via INTRAVENOUS
  Administered 2024-11-07: 1700 [IU]/h via INTRAVENOUS
  Administered 2024-11-08 – 2024-11-10 (×5): 1750 [IU]/h via INTRAVENOUS
  Administered 2024-11-11: 1950 [IU]/h via INTRAVENOUS
  Filled 2024-11-02 (×12): qty 250

## 2024-11-02 MED ORDER — ROCURONIUM BROMIDE 10 MG/ML (PF) SYRINGE
PREFILLED_SYRINGE | INTRAVENOUS | Status: DC | PRN
  Administered 2024-11-02: 20 mg via INTRAVENOUS
  Administered 2024-11-02: 30 mg via INTRAVENOUS
  Administered 2024-11-02: 10 mg via INTRAVENOUS

## 2024-11-02 MED ORDER — DEXTROSE 50 % IV SOLN
INTRAVENOUS | Status: AC
  Administered 2024-11-02: 25 g via INTRAVENOUS
  Filled 2024-11-02: qty 50

## 2024-11-02 MED ORDER — FENTANYL CITRATE (PF) 100 MCG/2ML IJ SOLN
INTRAMUSCULAR | Status: AC
  Filled 2024-11-02: qty 2

## 2024-11-02 MED ORDER — SODIUM CHLORIDE 0.9% IV SOLUTION: Freq: Once | INTRAVENOUS | Status: AC

## 2024-11-02 MED ORDER — FENTANYL CITRATE (PF) 250 MCG/5ML IJ SOLN
INTRAMUSCULAR | Status: DC | PRN
  Administered 2024-11-02: 100 ug via INTRAVENOUS
  Administered 2024-11-02 (×2): 50 ug via INTRAVENOUS

## 2024-11-02 MED ORDER — HEPARIN SODIUM (PORCINE) 1000 UNIT/ML IJ SOLN
INTRAMUSCULAR | Status: DC | PRN
  Administered 2024-11-02: 7000 [IU] via INTRAVENOUS
  Administered 2024-11-02: 2000 [IU] via INTRAVENOUS

## 2024-11-02 MED ORDER — POTASSIUM CHLORIDE 10 MEQ/100ML IV SOLN
10.0000 meq | INTRAVENOUS | Status: AC
  Administered 2024-11-02 (×3): 10 meq via INTRAVENOUS
  Filled 2024-11-02: qty 100

## 2024-11-02 MED ORDER — LACTATED RINGERS IV SOLN: INTRAVENOUS | Status: DC | PRN

## 2024-11-02 MED ORDER — DEXTROSE 50 % IV SOLN: 25.0000 g | Freq: Once | INTRAVENOUS | Status: AC

## 2024-11-02 MED ORDER — OXIDIZED CELLULOSE EX PADS
MEDICATED_PAD | CUTANEOUS | Status: DC | PRN
  Administered 2024-11-02: 2 via TOPICAL

## 2024-11-02 NOTE — Progress Notes (Signed)
 Advanced Heart Failure Rounding Note   Subjective:    Back from OR after ab wound closure and revasc of LUE   Remains intubated/sedated.  Hemodynamics stable on DBA  POCUS echo today EF ~35% concern for LAD WMA    Objective:   Weight Range:  Vital Signs:   Temp:  [93.7 F (34.3 C)-97.6 F (36.4 C)] 96.3 F (35.7 C) (11/22 1015) Pulse Rate:  [71-80] 79 (11/22 1015) Resp:  [11-19] 12 (11/22 1015) BP: (88-147)/(47-83) 112/70 (11/22 1015) SpO2:  [94 %-100 %] 97 % (11/22 1015) FiO2 (%):  [30 %-60 %] 40 % (11/22 1015) Weight:  [77.8 kg] 77.8 kg (11/22 0030) Last BM Date :  (PTA)  Weight change: Filed Weights   10/29/24 1434 11/01/24 0500 11/02/24 0030  Weight: 59.9 kg 78.7 kg 77.8 kg    Intake/Output:   Intake/Output Summary (Last 24 hours) at 11/02/2024 1035 Last data filed at 11/02/2024 0800 Gross per 24 hour  Intake 1680.16 ml  Output 1870 ml  Net -189.84 ml     Physical Exam: General:  intubated/sedated HEENT: normal +ETT Neck: supple. no JVD.  Cor: Regular rate & rhythm. No rubs, gallops or murmurs. Lungs: clear Abdomen: soft, midline wound vac Extremities: no edema. LUE ischemic Neuro:sedated   Telemetry: Sinus 70s Personally reviewed  Labs: Basic Metabolic Panel: Recent Labs  Lab 10/29/24 1541 10/29/24 2131 10/30/24 0220 10/30/24 0921 10/31/24 0446 10/31/24 0614 10/31/24 0931 10/31/24 1837 11/01/24 0450 11/01/24 0807 11/02/24 0136 11/02/24 0247 11/02/24 0355  NA 142   < > 137  --  133*  --    < > 128* 126* 128* 131* 132* 129*  K 3.3*   < > 3.2*  --  2.9*  --    < > 3.5 3.9 3.5 3.4* 3.5 3.6  CL 108  --  108  --  96*  --   --  91* 90*  --   --   --  88*  CO2 21*  --  19*  --  27  --   --  30 28  --   --   --  30  GLUCOSE 141*  --  241*  --  136*  --   --  65* 83  --   --   --  95  BUN 25*  --  27*  --  26*  --   --  25* 28*  --   --   --  34*  CREATININE 1.12*  --  1.47*  --  1.12*  --   --  0.99 1.02*  --   --   --  1.13*   CALCIUM  7.9*  --  7.5*  --  6.3*  --   --  7.0* 7.1*  --   --   --  6.9*  MG 1.7  --   --  1.5* 2.0  --   --   --  2.1  --   --   --  2.1  PHOS  --   --   --   --   --  3.6  --   --  4.8*  --   --   --  5.8*   < > = values in this interval not displayed.    Liver Function Tests: Recent Labs  Lab 10/29/24 1541 10/30/24 1141 11/01/24 0450 11/01/24 0920 11/02/24 0355  AST 383* 123* 124*  --  78*  ALT 272* 130* 103*  --  72*  ALKPHOS 127* 48 62  --  44  BILITOT 0.3 <0.2 0.7 0.7 0.8  PROT 5.6* 3.4* 4.3*  --  3.8*  ALBUMIN  3.4* 2.2* 2.1*  --  <1.5*   Recent Labs  Lab 10/29/24 1541 10/30/24 0220 10/31/24 0446 11/01/24 0450  LIPASE >2,800* 882* 78* 22   No results for input(s): AMMONIA in the last 168 hours.  CBC: Recent Labs  Lab 10/29/24 1541 10/29/24 2131 10/31/24 0736 10/31/24 0931 10/31/24 1407 11/01/24 0450 11/01/24 0807 11/01/24 1544 11/02/24 0136 11/02/24 0147 11/02/24 0247 11/02/24 0853  WBC 1.1*   < > 11.6*  --  12.7* 14.3*  --  9.6  --   --   --  13.7*  NEUTROABS 1.0*  --  10.5*  --   --   --   --   --   --   --   --   --   HGB 13.0   < > 12.4   < > 11.3* 11.0* 11.2* 10.0* 10.2*  --  10.5* 9.8*  HCT 42.4   < > 38.0   < > 33.3* 32.6* 33.0* 30.2* 30.0*  --  31.0* 29.2*  MCV 94.4   < > 88.8  --  85.2 85.8  --  88.0  --   --   --  85.6  PLT 362   < > 45*   < > 37* 10*  --  15*  --  8*  --  44*   < > = values in this interval not displayed.    Cardiac Enzymes: No results for input(s): CKTOTAL, CKMB, CKMBINDEX, TROPONINI in the last 168 hours.  BNP: BNP (last 3 results) No results for input(s): BNP in the last 8760 hours.  ProBNP (last 3 results) Recent Labs    11/01/24 0920  PROBNP >35,000.0*      Other results:  Imaging: DG Chest Port 1 View Result Date: 11/02/2024 EXAM: 1 VIEW(S) XRAY OF THE CHEST 11/02/2024 01:48:08 AM COMPARISON: Earlier today. CLINICAL HISTORY: Acute respiratory failure (HCC). FINDINGS: LINES, TUBES AND  DEVICES: The endotracheal tube is 6.4 cm above the carina. Right PICC line tip in the SVC. Right internal jugular line tip in the SVC. LUNGS AND PLEURA: Bilateral pleural effusions with bibasilar airspace opacities, right greater than left, unchanged since prior study. No pneumothorax. HEART AND MEDIASTINUM: No acute abnormality of the cardiac and mediastinal silhouettes. BONES AND SOFT TISSUES: No acute osseous abnormality. IMPRESSION: 1. Bilateral pleural effusions with bibasilar airspace opacities, right greater than left, unchanged since prior study. Electronically signed by: Franky Crease MD 11/02/2024 02:05 AM EST RP Workstation: [id number]   VAS US  UPPER EXTREMITY ARTERIAL DUPLEX Result Date: 11/01/2024  UPPER EXTREMITY DUPLEX STUDY Patient Name:  ANAMARI GALEAS [facility]  Date of Exam:   11/01/2024 Medical Rec #: [id number]         Accession #:    [id number] Date of Birth: November 03, 1966         Patient Gender: F Patient Age:   58 years Exam Location:  [facility] Procedure:      VAS US  UPPER EXTREMITY ARTERIAL DUPLEX Referring Phys: PAULA SIMPSON --------------------------------------------------------------------------------  Indications: Bruising and No radial artery doppler pulse.  Performing Technologist: Ricka Sturdivant-Jones RDMS, RVT  Examination Guidelines: A complete evaluation includes B-mode imaging, spectral Doppler, color Doppler, and power Doppler as needed of all accessible portions of each vessel. Bilateral testing is considered an integral part of a complete examination. Limited examinations for reoccurring indications may  be performed as noted.  Left Doppler Findings: +-------------+----------+----------+--------+----------------+ Site         PSV (cm/s)Waveform  StenosisComments         +-------------+----------+----------+--------+----------------+ Brachial Dist31        triphasic                          +-------------+----------+----------+--------+----------------+ Radial  Prox  24        triphasic                          +-------------+----------+----------+--------+----------------+ Radial Mid   12        monophasic                         +-------------+----------+----------+--------+----------------+ Radial Dist  0         occluded          appears occluded +-------------+----------+----------+--------+----------------+ Ulnar Prox   34        triphasic                          +-------------+----------+----------+--------+----------------+ Ulnar Mid    22        triphasic                          +-------------+----------+----------+--------+----------------+ Ulnar Dist   19        triphasic                          +-------------+----------+----------+--------+----------------+   Summary:  Left: Left radial artery distal segment appears occluded. Doppler       flow absent. *See table(s) above for measurements and observations. Electronically signed by Fonda Rim on 11/01/2024 at 7:24:58 PM.    Final    VAS US  UPPER EXTREMITY VENOUS DUPLEX Result Date: 11/01/2024 UPPER VENOUS STUDY  Patient Name:  FRANCINA BEERY [facility]  Date of Exam:   11/01/2024 Medical Rec #: [id number]         Accession #:    [id number] Date of Birth: 1966/10/24         Patient Gender: F Patient Age:   77 years Exam Location:  [facility] Procedure:      VAS US  UPPER EXTREMITY VENOUS DUPLEX Referring Phys: VINA SIMPSON --------------------------------------------------------------------------------  Indications: ICU patient - fever, septic shock Other Indications: Underwent exploratory laparotomy 10/31/24. Limitations: Line and bandages. Performing Technologist: Ricka Sturdivant-Jones RDMS, RVT  Examination Guidelines: A complete evaluation includes B-mode imaging, spectral Doppler, color Doppler, and power Doppler as needed of all accessible portions of each vessel. Bilateral testing is considered an integral part of a complete examination. Limited examinations for  reoccurring indications may be performed as noted.  Right Findings: +----------+------------+---------+-----------+----------+--------------+ RIGHT     CompressiblePhasicitySpontaneousProperties   Summary     +----------+------------+---------+-----------+----------+--------------+ IJV                                                 Not visualized +----------+------------+---------+-----------+----------+--------------+ Subclavian    Full       Yes       Yes                             +----------+------------+---------+-----------+----------+--------------+  Axillary      Full       Yes       Yes                             +----------+------------+---------+-----------+----------+--------------+ Brachial      Full                                                 +----------+------------+---------+-----------+----------+--------------+ Radial        Full                                                 +----------+------------+---------+-----------+----------+--------------+ Ulnar         Full                                                 +----------+------------+---------+-----------+----------+--------------+ Cephalic    Partial                                    Chronic     +----------+------------+---------+-----------+----------+--------------+ Basilic       Full                                                 +----------+------------+---------+-----------+----------+--------------+ IJV not visualized due to tape.  Left Findings: +----------+------------+---------+-----------+----------+-------+ LEFT      CompressiblePhasicitySpontaneousPropertiesSummary +----------+------------+---------+-----------+----------+-------+ IJV           Full       Yes       Yes                      +----------+------------+---------+-----------+----------+-------+ Subclavian    Full       Yes       Yes                       +----------+------------+---------+-----------+----------+-------+ Axillary      Full       Yes       Yes                      +----------+------------+---------+-----------+----------+-------+ Brachial      Full                                          +----------+------------+---------+-----------+----------+-------+ Radial        Full                                          +----------+------------+---------+-----------+----------+-------+ Ulnar         Full                                          +----------+------------+---------+-----------+----------+-------+  Cephalic    Partial                                 Chronic +----------+------------+---------+-----------+----------+-------+ Basilic       Full                                          +----------+------------+---------+-----------+----------+-------+  Summary:  Right: No evidence of deep vein thrombosis in the upper extremity. Findings consistent with chronic superficial vein thrombosis involving the right cephalic vein.  Left: No evidence of deep vein thrombosis in the upper extremity. Findings consistent with chronic superficial vein thrombosis involving the left cephalic vein.  *See table(s) above for measurements and observations.  Diagnosing physician: Fonda Rim Electronically signed by Fonda Rim on 11/01/2024 at 7:24:42 PM.    Final    VAS US  LOWER EXTREMITY VENOUS (DVT) Result Date: 11/01/2024  Lower Venous DVT Study Patient Name:  HENNA DERDERIAN [facility]  Date of Exam:   11/01/2024 Medical Rec #: [id number]         Accession #:    [id number] Date of Birth: 1966-02-24         Patient Gender: F Patient Age:   79 years Exam Location:  [facility] Procedure:      VAS US  LOWER EXTREMITY VENOUS (DVT) Referring Phys: VINA SIMPSON --------------------------------------------------------------------------------  Indications: Fever, septic shock, and Edema.  Comparison Study: 05/24/19 - negative  Performing Technologist: Ricka Sturdivant-Jones RDMS, RVT  Examination Guidelines: A complete evaluation includes B-mode imaging, spectral Doppler, color Doppler, and power Doppler as needed of all accessible portions of each vessel. Bilateral testing is considered an integral part of a complete examination. Limited examinations for reoccurring indications may be performed as noted. The reflux portion of the exam is performed with the patient in reverse Trendelenburg.  +---------+---------------+---------+-----------+----------+--------------+ RIGHT    CompressibilityPhasicitySpontaneityPropertiesThrombus Aging +---------+---------------+---------+-----------+----------+--------------+ CFV      Full           Yes      Yes                                 +---------+---------------+---------+-----------+----------+--------------+ SFJ      Full                                                        +---------+---------------+---------+-----------+----------+--------------+ FV Prox  Full                                                        +---------+---------------+---------+-----------+----------+--------------+ FV Mid   Full           Yes      Yes                                 +---------+---------------+---------+-----------+----------+--------------+ FV DistalFull                                                        +---------+---------------+---------+-----------+----------+--------------+  PFV      Full                                                        +---------+---------------+---------+-----------+----------+--------------+ POP      Full           Yes      Yes                                 +---------+---------------+---------+-----------+----------+--------------+ PTV      Partial                                      Chronic        +---------+---------------+---------+-----------+----------+--------------+ PERO     Partial                                       Chronic        +---------+---------------+---------+-----------+----------+--------------+    Summary: RIGHT: - There is no evidence of deep vein thrombosis in the lower extremity.  - No cystic structure found in the popliteal fossa.  LEFT: - Findings consistent with chronic deep vein thrombosis involving the left posterior tibial veins, and left peroneal veins.  - No cystic structure found in the popliteal fossa.  *See table(s) above for measurements and observations. Electronically signed by Fonda Rim on 11/01/2024 at 7:24:25 PM.    Final    DG CHEST PORT 1 VIEW Result Date: 11/01/2024 EXAM: 1 VIEW(S) XRAY OF THE CHEST 11/01/2024 11:08:00 AM COMPARISON: 10/30/2024 CLINICAL HISTORY: Acute hypoxic respiratory failure (HCC) [id number] FINDINGS: LINES, TUBES AND DEVICES: Right IJ CVC in place with tip overlying distal SVC. Endotracheal tube in place with tip 7.6 cm above carina. LUNGS AND PLEURA: Right basilar atelectasis or consolidation, new from prior exam. New Small to moderate right pleural effusion. Unchanged changed small left pleural effusions. No pneumothorax. HEART AND MEDIASTINUM: No acute abnormality of the cardiac and mediastinal silhouettes. BONES AND SOFT TISSUES: Left shoulder prosthesis noted. Right upper quadrant surgical clips noted. No acute osseous abnormality. IMPRESSION: 1. New right basilar atelectasis or consolidation with small to moderate right pleural effusion. 2. Unchanged small left pleural effusion. Electronically signed by: Waddell Calk MD 11/01/2024 02:10 PM EST RP Workstation: [id number]   US  EKG SITE RITE Result Date: 11/01/2024 If Site Rite image not attached, placement could not be confirmed due to current cardiac rhythm.  ECHOCARDIOGRAM COMPLETE Result Date: 11/01/2024    ECHOCARDIOGRAM REPORT   Patient Name:   TENIA GOH [facility] Date of Exam: 10/31/2024 Medical Rec #:  [id number]        Height:       64.0 in Accession #:    [id number]        Weight:       132.0 lb Date of Birth:  January 07, 1966        BSA:          1.640 m Patient Age:    58 years         BP:           130//80 mmHg Patient Gender: F  HR:           93 bpm. Exam Location:  Inpatient Procedure: 2D Echo and Intracardiac Opacification Agent (Both Spectral and Color            Flow Doppler were utilized during procedure). Indications:    Bacteremia  History:        Patient has prior history of Echocardiogram examinations.  Sonographer:    Charmaine Gaskins Referring Phys: [phone] KELLY OSBORNE  Sonographer Comments: Technically difficult study due to poor echo windows, Technically challenging study due to limited acoustic windows and echo performed with patient supine and on artificial respirator. IMPRESSIONS  1. Left ventricular ejection fraction, by estimation, is 20 to 25%. The left ventricle has severely decreased function. The left ventricle demonstrates regional wall motion abnormalities (see scoring diagram/findings for description). The left ventricular internal cavity size was mildly dilated. Left ventricular diastolic function could not be evaluated.  2. Right ventricular systolic function is normal. The right ventricular size is normal.  3. The mitral valve is grossly normal. Trivial mitral valve regurgitation. No evidence of mitral stenosis.  4. The aortic valve is grossly normal. Aortic valve regurgitation is not visualized. No aortic stenosis is present. Conclusion(s)/Recommendation(s): Regional wall motion abnormalities may be consistent with stress cardiomyopathy in the absence of obstructive CAD. FINDINGS  Left Ventricle: Left ventricular ejection fraction, by estimation, is 20 to 25%. The left ventricle has severely decreased function. The left ventricle demonstrates regional wall motion abnormalities. Definity  contrast agent was given IV to delineate the left ventricular endocardial borders. The left ventricular internal cavity size was mildly dilated. There is no left  ventricular hypertrophy. Left ventricular diastolic function could not be evaluated.  LV Wall Scoring: The mid and distal lateral wall, mid and distal anterior septum, mid inferoseptal segment, apical anterior segment, and apical inferior segment are akinetic. The mid anterolateral segment, mid anterior segment, and mid inferior segment are hypokinetic. The basal anteroseptal segment, basal inferolateral segment, basal anterolateral segment, basal anterior segment, basal inferior segment, and basal inferoseptal segment are normal. Right Ventricle: The right ventricular size is normal. No increase in right ventricular wall thickness. Right ventricular systolic function is normal. Left Atrium: Left atrial size was normal in size. Right Atrium: Right atrial size was normal in size. Pericardium: Trivial pericardial effusion is present. Mitral Valve: The mitral valve is grossly normal. Trivial mitral valve regurgitation. No evidence of mitral valve stenosis. Tricuspid Valve: The tricuspid valve is grossly normal. Tricuspid valve regurgitation is trivial. No evidence of tricuspid stenosis. Aortic Valve: The aortic valve is grossly normal. Aortic valve regurgitation is not visualized. No aortic stenosis is present. Pulmonic Valve: The pulmonic valve was not well visualized. Pulmonic valve regurgitation is trivial. No evidence of pulmonic stenosis. Aorta: The aortic root was not well visualized. Venous: IVC assessment for right atrial pressure unable to be performed due to mechanical ventilation. IAS/Shunts: The interatrial septum was not well visualized.   LV Volumes (MOD) LV vol d, MOD A2C: 119.0 ml LV vol d, MOD A4C: 116.0 ml LV vol s, MOD A2C: 93.7 ml LV vol s, MOD A4C: 100.0 ml LV SV MOD A2C:     25.3 ml LV SV MOD A4C:     116.0 ml LV SV MOD BP:      19.8 ml RIGHT VENTRICLE RV Basal diam:  2.60 cm RV Mid diam:    2.50 cm TAPSE (M-mode): 2.0 cm LEFT ATRIUM  Index        RIGHT ATRIUM           Index LA Vol  (A2C):   42.4 ml 25.86 ml/m  RA Area:     10.60 cm LA Vol (A4C):   41.5 ml 25.31 ml/m  RA Volume:   24.90 ml  15.19 ml/m LA Biplane Vol: 42.3 ml 25.80 ml/m  TRICUSPID VALVE TR Peak grad:   18.8 mmHg TR Vmax:        217.00 cm/s Soyla Merck MD Electronically signed by Soyla Merck MD Signature Date/Time: 11/01/2024/12:16:40 AM    Final      Medications:     Scheduled Medications:  sodium chloride    Intravenous Once   Chlorhexidine  Gluconate Cloth  6 each Topical Daily   hydrocortisone  sod succinate (SOLU-CORTEF ) inj  100 mg Intravenous BID   insulin  aspart  0-15 Units Subcutaneous Q4H   mouth rinse  15 mL Mouth Rinse Q2H   pantoprazole  (PROTONIX ) IV  40 mg Intravenous Q12H   thiamine  (VITAMIN B1) injection  100 mg Intravenous Q24H    Infusions:  desmopressin  (DDAVP ) 20 mcg in sodium chloride  0.9 % 50 mL IVPB     DOBUTamine  2.5 mcg/kg/min (11/02/24 0957)   fentaNYL  infusion INTRAVENOUS 50 mcg/hr (11/02/24 0957)   micafungin  (MYCAMINE ) 100 mg in sodium chloride  0.9 % 100 mL IVPB 100 mg (11/02/24 1018)   piperacillin -tazobactam (ZOSYN )  IV 12.5 mL/hr at 11/02/24 0600   potassium chloride  10 mEq (11/02/24 0952)   propofol  (DIPRIVAN ) infusion 20 mcg/kg/min (11/02/24 0957)    PRN Medications: acetaminophen , fentaNYL , mouth rinse, sodium chloride  flush   Assessment:   1. Septic shock with e.coli bacteremia +/- component of cardiogenic shock New Cardiomyopathy -  - Patient was admitted with small bowel obstruction and septic shock. Patient underwent exploratory laparotomy exploratory laparotomy was found to have a densities at the end obstructing the proximal small bowel near Roux-en-Y anastomosis.   - Bcx + for e.coli - abx and stress-dose steroids per CCM - ab closure on 11/22 - remains on DBA for cardiac support - Echo showed LVEF of 20-25% with multiple wall motion abnormalities (akinesis of the mid and distal lateral wall, mid and distal anterior septum, mid inferoseptal  segment, apical anterior segment, and apical inferior segment as well as hypokinesis of the mid anterolateral segment, mid anterior segment, and mid inferior segment). Pr BNP >35,000.  - POCUS ECHO today EF ~35% with RWMA concerning for Tako-tsubo vs CAD - Continue DBA. Can add GDMT soon  - May need cath prior to d/c if EF not improving (pending improvement of thrombocytopenia and overall condition)   2. Acute systolic HF - plan as above.  - continue DBA  3. Acute hypoxic resp failure - wean vent as tolerated  4. Non-Obstructive CAD - Coronary CTA in 09/2024 showed coronary calcium  score of 315 (90th percentile for age and sex) and mild mixed nonobstructive CAD with 1-24% stenosis of the proximal RCA and 25-49% stenosis of LAD. - No Aspirin  given severe thrombocytopenia.  - Intolerant to statins in the past.    5. Ischemic Left Hand - VVS following. S/p revasc 11/22   6. Severe thrombocytopenia - PLTS 8k this am  - due to sepsis - supportive care   CRITICAL CARE Performed by: Jaydn Fincher  Total critical care time: 45 minutes  Critical care time was exclusive of separately billable procedures and treating other patients.  Critical care was necessary to treat or prevent imminent or  life-threatening deterioration.  Critical care was time spent personally by me (independent of midlevel providers or residents) on the following activities: development of treatment plan with patient and/or surrogate as well as nursing, discussions with consultants, evaluation of patient's response to treatment, examination of patient, obtaining history from patient or surrogate, ordering and performing treatments and interventions, ordering and review of laboratory studies, ordering and review of radiographic studies, pulse oximetry and re-evaluation of patient's condition.    Length of Stay: 4   Toribio Fuel MD 11/02/2024, 10:35 AM  Advanced Heart Failure Team Pager [phone] (M-F; 7a  - 4p)  Please contact [facility] for night-coverage after hours (4p -7a ) and weekends on [email or web address]

## 2024-11-02 NOTE — Op Note (Signed)
    NAME: Morgan [name]    MRN: [id number] DOB: December 03, 1966    DATE OF OPERATION: 11/02/2024  PREOP DIAGNOSIS:    Left hand ischemia  POSTOP DIAGNOSIS:    Same  PROCEDURE:    Left radial artery cutdown #2 Fogarty embolectomy radial artery, superficial palmar arch, deep palmar arch Primary repair of the left radial artery  SURGEON: [name]  ASSIST: Donnice Sender, PA  ANESTHESIA: General  EBL: 15 mL  INDICATIONS:    Morgan [name] is a 58 y.o. female who presented with bowel ischemia now status post lysis of adhesions, E. coli bacteremia which required significant pressor, left-sided A-line which was pulled, radial artery occluded with left hand ischemia.  She is now off pressors.  Platelet count elevated to 44,000.  We discussed left radial embolectomy in effort to salvage tissue in the hand.  After discussing risks and benefits, Morgan [name] elected to proceed.  FINDINGS:   Thrombus throughout the radial artery.  The superficial and deep branches of the radial arch were controlled with Vesseloops and a #2 Fogarty embolectomy catheter taken through the entirety of the radial artery, both superficial and deep branches of the radial arch.  Significant thrombotic return.  Patient palpable at case completion  TECHNIQUE:   Patient is brought to the OR laid in supine position.  General anesthesia was induced and patient prepped draped standard fashion.  The case began with ultrasound insonation of the left wrist.  The radial artery was marked and a longitudinal incision made at the level of the wrist.  This was taken down to the radial artery.  The radial artery was controlled proximally distally.  The patient was heparinized, and a small, transverse arteriotomy made at the previous A-line access site.  Thrombus was encountered.  #2 Fogarty embolectomy catheter was brought to the field and first passed proximally.  This yielded a significant amount of thrombus.  Another pass was  made to ensure there is no residual thrombus.  There was not.  Next, we moved distally.  There were 2 branches both the superficial palmar arch and deep palmar arch branches were controlled with Vesseloops and the Fogarty embolectomy catheter run through each.  There was thrombus in each of these branches.  In total, several passes were made until there were 2 clean passes each.  There was excellent backbleeding.  The arteriotomy was closed using interrupted 7-0 Prolene suture the wound bed was irrigated with copious amounts of saline and closed in layers using 3-0 Vicryl suture with staples at the level of the skin.  Excellent Doppler signal distally at case completion.  Patient has been maximally revascularized.  If the radial artery rethromboses, there will be no intervention as I remain concerned that there could be outflow thrombosis.   [name], MD [facility] DATE OF DICTATION:   11/02/2024

## 2024-11-02 NOTE — Brief Op Note (Signed)
 11/02/2024  12:53 PM  PATIENT:  Morgan [name]  59 y.o. female  PRE-OPERATIVE DIAGNOSIS:  OPEN ABDOMEN  POST-OPERATIVE DIAGNOSIS:  OPEN ABDOMEN  PROCEDURE:  Procedure(s) with comments: LAPAROTOMY, RE-EXPLORATION (N/A) - RE- EX LAP W/ POSSIBLE ABDOMINAL CLOSURE THROMBECTOMY, ARTERY, RADIAL (Left) IRRIGATION AND DEBRIDEMENT, WOUND, ABDOMEN, WITH CLOSURE (N/A) APPLICATION, WOUND VAC (N/A)  SURGEON:  Surgeons and Role: Panel 1:    DEWAINE Tanda Locus, MD - Primary    * Ann Fine, MD - Assisting Panel 2:    * Lanis Fonda BRAVO, MD - Primary  PHYSICIAN ASSISTANT:   ASSISTANTS: Dr Fine Ann    ANESTHESIA:   general  EBL:  minimal   BLOOD ADMINISTERED:finished plts from ICU PLTS  DRAINS: Urinary Catheter (Foley) and Gastrostomy Tube   LOCAL MEDICATIONS USED:  NONE  SPECIMEN:  No Specimen  DISPOSITION OF SPECIMEN:  N/A  COUNTS:  YES  TOURNIQUET:  * No tourniquets in log *  DICTATION: .Dragon Dictation  PLAN OF CARE: icu  PATIENT DISPOSITION:  ICU - intubated and hemodynamically stable.   Delay start of Pharmacological VTE agent (>24hrs) due to surgical blood loss or risk of bleeding: yes  Locus HERO. Tanda, MD, FACS General, Bariatric, & Minimally Invasive Surgery [facility],  [facility]

## 2024-11-02 NOTE — Progress Notes (Signed)
  Daily Progress Note  S/p: SBO, LOA, E. coli bacteremia, open abdomen, significant pressor requirement, occluded left radial artery, hand ischemia, thrombocytopenia.  Subjective: Intubated sedated  Objective: Vitals:   11/02/24 0720 11/02/24 0758  BP:    Pulse:    Resp:  11  Temp: (!) 96.1 F (35.6 C) (!) 95.2 F (35.1 C)  SpO2:      Physical Examination Intubated, sedated Left thumb mottled, does not appear salvageable. Mildly continues into the hand Multiphasic ulnar signal, multiphasic palmar arch signal  ASSESSMENT/PLAN:  Patient is a 58 year old female currently with open abdomen status post SBO, LOA, E. coli bacteremia which required significant pressor.  Left-sided A-line site occluded.  Continues to have severe thrombocytopenia with a platelet count of 8.  At this point, I do not think that the thumb is salvageable.  I think a new CBC and PT/INR are pending.  Last platelet count was 8000.    Attempted revascularization of the radial artery would be to save any marginal tissue present.  Will follow and offer when platelet count improves.   [name] MD MS [facility] [phone] 11/02/2024  8:47 AM

## 2024-11-02 NOTE — Progress Notes (Signed)
 PHARMACY - ANTICOAGULATION CONSULT NOTE  Pharmacy Consult for heparin  infusion Indication: postop vascular procedure  Allergies  Allergen Reactions   Ketoconazole Hives, Swelling and Other (See Comments)    SWELLING REACTION UNSPECIFIED    Pravachol  [Pravastatin  Sodium] Shortness Of Breath, Swelling and Anaphylaxis    Throat swelling   Statins Other (See Comments)    Leg cramps   Tape Dermatitis and Other (See Comments)    Steri-Strips   Repatha  [Evolocumab ] Other (See Comments)    Made the stomach hurt badly.   Codeine Other (See Comments) and Hypertension    Increased B/P    Patient Measurements: Height: 5' 4 (162.6 cm) Weight: 77.8 kg (171 lb 8.3 oz) IBW/kg (Calculated) : 54.7 HEPARIN  DW (KG): 59.9  Vital Signs: Temp: 96.1 F (35.6 C) (11/22 1320) Temp Source: Esophageal (11/22 1305) BP: 104/84 (11/22 1320) Pulse Rate: 83 (11/22 1320)  Labs: Recent Labs    10/31/24 0945 10/31/24 1407 10/31/24 1837 11/01/24 0450 11/01/24 0807 11/01/24 1544 11/02/24 0136 11/02/24 0147 11/02/24 0247 11/02/24 0355 11/02/24 0853  HGB  --    < >  --  11.0*   < > 10.0* 10.2*  --  10.5*  --  9.8*  HCT  --    < >  --  32.6*   < > 30.2* 30.0*  --  31.0*  --  29.2*  PLT 19*   < >  --  10*  --  15*  --  8*  --   --  44*  APTT 53*  --   --   --   --   --   --  29  --   --   --   LABPROT 21.9*  --   --  13.9  --   --   --  13.3  --   --  12.2  INR 1.8*  --   --  1.0  --   --   --  1.0  --   --  0.9  CREATININE  --   --  0.99 1.02*  --   --   --   --   --  1.13*  --    < > = values in this interval not displayed.    Estimated Creatinine Clearance: 55.4 mL/min (A) (by C-G formula based on SCr of 1.13 mg/dL (H)).   Medical History: Past Medical History:  Diagnosis Date   Allergic rhinitis    Anemia     after gastric bypass in 2018   Anxiety    Barrett's esophagus    Bipolar affective disorder (HCC)    CAP (community acquired pneumonia) 07/20/2018   Chronic respiratory  failure (HCC)    Constipation, chronic    Degenerative joint disease of spine    Depression    Diabetes mellitus without complication (HCC)    type 2    DM (diabetes mellitus) (HCC)    hypoglycemic per pt - no longer takes DM meds due to weight loss from gastric bypass in 2018   Dry eye    Family history of adverse reaction to anesthesia    nausea and vomiting   Gastroparesis    GERD (gastroesophageal reflux disease)    H/O shoulder replacement 08/11/2020   left shoulder   History of bariatric surgery 03/2017   History of hiatal hernia    HLD (hyperlipidemia) 03/12/2013   Hyperlipidemia    Hypertension    No HTN meds since weight loss from Gastric Bypass in  2018   Intractable chronic migraine without aura 06/04/2015   Migraine headache    Morbid obesity (HCC)    OSA (obstructive sleep apnea)    No cpap since gastric surgery   Osteoarthritis    bilateral knee   SBO (small bowel obstruction) (HCC) 03/19/2019   Sleep apnea    Unspecified hypothyroidism    Vitamin B 12 deficiency    Vitamin D  deficiency     Medications:  Scheduled:   sodium chloride    Intravenous Once   Chlorhexidine  Gluconate Cloth  6 each Topical Daily   hydrocortisone  sod succinate (SOLU-CORTEF ) inj  100 mg Intravenous BID   insulin  aspart  0-15 Units Subcutaneous Q4H   mouth rinse  15 mL Mouth Rinse Q2H   pantoprazole  (PROTONIX ) IV  40 mg Intravenous Q12H   thiamine  (VITAMIN B1) injection  100 mg Intravenous Q24H   Infusions:   DOBUTamine  2.5 mcg/kg/min (11/02/24 1300)   fentaNYL  infusion INTRAVENOUS 100 mcg/hr (11/02/24 1315)   micafungin  (MYCAMINE ) 100 mg in sodium chloride  0.9 % 100 mL IVPB 100 mg (11/02/24 1018)   piperacillin -tazobactam (ZOSYN )  IV 3.375 g (11/02/24 1324)   propofol  (DIPRIVAN ) infusion 40 mcg/kg/min (11/02/24 1300)    Assessment: 58 yo F presenting with E. Coli bacteremia 2/2 SBO with perforation. On 11/22 underwent closure of abdomen in OR and repair of left radial artery. Not  on AC PTA. Pharmacy consulted for heparin  management.  Per vascular surgery, plan to keep heparin  infusion at 500 units/hr and monitor platelets closely with significant thrombocytopenia this admission. Received 2 units of platelets 11/22.   Goal of Therapy:  Heparin  level < 0.1 units/ml Monitor platelets by anticoagulation protocol: Yes   Plan:  Start heparin  infusion at 500 units/hr with no titration Check 6 hour heparin  level Monitor daily heparin  level, CBC, and signs/symptoms of bleeding   Thank you for allowing pharmacy to be a part of this patient's care.   Nidia Schaffer, PharmD PGY2 Cardiology Pharmacy Resident  Please check AMION for all [facility] Pharmacy phone numbers After 10:00 PM, call Main Pharmacy [phone] 11/02/2024,2:10 PM

## 2024-11-02 NOTE — Transfer of Care (Signed)
 Immediate Anesthesia Transfer of Care Note  Patient: Morgan [name]  Procedure(s) Performed: LAPAROTOMY, RE-EXPLORATION IRRIGATION AND DEBRIDEMENT, WOUND, ABDOMEN, WITH CLOSURE APPLICATION, WOUND VAC THROMBECTOMY, ARTERY, RADIAL (Left: Arm Lower)  Patient Location: ICU   Anesthesia Type:General  Level of Consciousness: Patient remains intubated per anesthesia plan  Airway & Oxygen  Therapy: Patient remains intubated per anesthesia plan and Patient placed on Ventilator (see vital sign flow sheet for setting)  Post-op Assessment: Post -op Vital signs reviewed and stable  Post vital signs: Reviewed and stable  Last Vitals:  Vitals Value Taken Time  BP 106/76 11/02/24 13:30  Temp 35.5 C 11/02/24 13:41  Pulse 83 11/02/24 13:30  Resp 15 11/02/24 13:41  SpO2 100 % 11/02/24 13:30  Vitals shown include unfiled device data.  Last Pain:  Vitals:   11/02/24 1305  TempSrc: Esophageal  PainSc:          Complications: No notable events documented.

## 2024-11-02 NOTE — Op Note (Signed)
 11/02/2024  1:16 PM  PATIENT:  Morgan [name]  58 y.o. female  PRE-OPERATIVE DIAGNOSIS:  OPEN ABDOMEN; h/o exploratory laparotomy, lysis of adhesion, placement of gastrostomy tube, placement of Abthera vac 10/29/24   POST-OPERATIVE DIAGNOSIS:  same  PROCEDURE:  Procedure(s): 1) LAPAROTOMY, RE-EXPLORATION 2)ABDOMINAL CLOSURE 3) Negative pressure wound therapy (vacuum-assisted drainage collection), utilizing nondisposable durable medical equipment (DME) on an open wound, including topical application, wound assessment, and instructions for ongoing care; total wound surface area greater than 50 cm   THROMBECTOMY, ARTERY, RADIAL - Dr Lanis - (dictated separately)  SURGEON:  Surgeon(s): Morgan Locus, MD  ASSISTANTS: Morgan Silversmith MD    ANESTHESIA:   general  EBL: minimal  DRAINS: Urinary Catheter (Foley) and Gastrostomy Tube   LOCAL MEDICATIONS USED:  NONE  SPECIMEN:  No Specimen  DISPOSITION OF SPECIMEN:  N/A  COUNTS:  YES  INDICATION FOR PROCEDURE: 58 year old female with a remote history of laparoscopic Roux-en-Y gastric bypass came in earlier this week in septic shock with an acute abdomen.  She was taken emergently by one of my partners on November 18 where she underwent lysis of adhesion, placement of a gastrotomy tube and placement of an ABThera wound VAC and left open due to her septic shock.    She was reexplored 2 days ago because of persistent refractory shock and the development of acute thrombocytopenia.  At that time there was no contamination and no signs of ischemic or nonviable bowel or viscera.  She was left open and returned to the ICU.  Thereafter her left hand became cold and lost a pulse and it was felt to be due to a combination of vasopressors as well as having an arterial line in the left wrist.  Vascular surgery was consulted and they recommended transfer to [facility] for definitive management.  Please see vascular surgery's notes for additional  details.  Her sepsis had remarkably improved and she was off all vasopressors except for some dobutamine  for cardiogenic mild shock.  Risk and benefits of reexploration were explained to the niece who provided consent  PROCEDURE: The patient was taken to operating room 11 at [facility] by vascular surgery.  Please see Dr. Francina operative note detailing his portion of the procedure.  Once he was done I started my portion of procedure.  The external dressing for the open abdominal wound VAC was removed along with the internal wound VAC sponge.  The abdomen was then prepped with a combination of ChloraPrep and Betadine .  The patient was on scheduled therapeutic antibiotics.  A surgical timeout was performed.  I reexplored her abdomen.  The gastric pouch looked normal along with the gastrojejunostomy.  There was a little bit of bile staining in the right upper quadrant along the lesser curvature of the stomach at the pylorus and in the gallbladder fossa.  I asked Dr. Silversmith to join me in the operating room.  I continued to explore the rest the abdomen.  The rest of the excluded stomach appeared normal.  The G-tube in the gastric remnant appeared attached to the abdominal wall and in good condition.  Transverse colon was lifted up.  Identified the biliary pancreatic limb and ran it to the jejunojejunostomy.  The biliopancreatic limb was probably about 30 cm long.  You could see where there had been an adhesive band going over the biliary pancreatic limb.  This had been visualized on her takeback on Thursday.  The section of the small bowel was viable.  There  is no evidence of stricture.  The jejunojejunostomy was viable.  There was no mesenteric defect.  The Roux limb was run from the gastric pouch all the way down to the jejunojejunostomy that appeared viable.  I then ran the common channel starting at the jejunojejunostomy all the way down to the terminal ileum and right colon.  The mesenteric defect that  I had closed at the terminal ileum was still intact.  The cecum and ascending colon was viable.  The transverse colon was viable.  The descending colon and sigmoid colon and upper rectum were viable.  We turned our attention back into the right upper quadrant.  I took down some omentum off the pylorus.  I was then able to visualize the entire portion of the antrum and pylorus.  There was no additional bile staining.  There was no evidence of a perforation.  The gastric wall and first portion of the duodenum appeared intact.  We irrigated the right upper quadrant and again there was no evidence of bile leak or defect in the wall of the gastric remnant nor proximal duodenum.  There is no bile staining in the retroperitoneum.  I irrigated the abdomen with 2 L of saline.  I reran the entire small bowel 1 more time.  There was no evidence of a Petersons defect.  There was no mesenteric defect at the jejunojejunostomy.  There is no signs of bile staining or fibrinous exudate around the gastrojejunostomy.  I did place a small piece of surgical snow over the lesser curvature of the stomach where we had mobilized some of that tissue since her platelet count was so low.  I then closed the fascia with a #1 looped PDS 1 from above and 1 from below and tied centrally.  I have placed 5 interrupted Novafil sutures as internal retention sutures.  A negative pressure wound VAC (Vacuum Assisted drainage Collection device) using nondisposable durable medical equipment (DME) was placed onto this open wound with a total wound surface area of (20 x 6 cm).  The wound VAC sponge was connected to the machine and we had a good seal with no air leak  All needle, instrument, and sponge counts were correct x 2.  There were no immediate complications.  The patient tolerated the procedure well.  She was taken back to the ICU intubated  PLAN OF CARE: Already inpatient  PATIENT DISPOSITION:  ICU - intubated and hemodynamically stable.    Delay start of Pharmacological VTE agent (>24hrs) due to surgical blood loss or risk of bleeding:  yes  [name]. Tanda, MD, FACS General, Bariatric, & Minimally Invasive Surgery [facility], A [facility]

## 2024-11-02 NOTE — Progress Notes (Addendum)
 eLink Physician-Brief Progress Note Patient Name: MURRELL ELIZONDO DOB: 01-04-1966 MRN: [id number]   Date of Service  11/02/2024  HPI/Events of Note  platelets 8, suboptimal response to platelets with last transfusion  eICU Interventions  Transfuse 2u platelets   0647 -KCl  Intervention Category Intermediate Interventions: Coagulopathy - evaluation and management  Ermine Stebbins 11/02/2024, 2:43 AM

## 2024-11-02 NOTE — Progress Notes (Addendum)
 PHARMACY - ANTICOAGULATION CONSULT NOTE  Pharmacy Consult for heparin  infusion Indication: postop vascular procedure  Allergies  Allergen Reactions   Ketoconazole Hives, Swelling and Other (See Comments)    SWELLING REACTION UNSPECIFIED    Pravachol  [Pravastatin  Sodium] Shortness Of Breath, Swelling and Anaphylaxis    Throat swelling   Statins Other (See Comments)    Leg cramps   Tape Dermatitis and Other (See Comments)    Steri-Strips   Repatha  [Evolocumab ] Other (See Comments)    Made the stomach hurt badly.   Codeine Other (See Comments) and Hypertension    Increased B/P    Patient Measurements: Height: 5' 4 (162.6 cm) Weight: 77.8 kg (171 lb 8.3 oz) IBW/kg (Calculated) : 54.7 HEPARIN  DW (KG): 59.9  Vital Signs: Temp: 98.1 F (36.7 C) (11/22 2100) Temp Source: Esophageal (11/22 2000) BP: 109/69 (11/22 2100) Pulse Rate: 97 (11/22 2100)  Labs: Recent Labs    10/31/24 0945 10/31/24 1407 11/01/24 0450 11/01/24 0807 11/02/24 0147 11/02/24 0247 11/02/24 0355 11/02/24 0853 11/02/24 1631 11/02/24 2053  HGB  --    < > 11.0*   < >  --  10.5*  --  9.8* 9.8*  --   HCT  --    < > 32.6*   < >  --  31.0*  --  29.2* 29.5*  --   PLT 19*   < > 10*   < > 8*  --   --  44* 84*  --   APTT 53*  --   --   --  29  --   --   --   --   --   LABPROT 21.9*  --  13.9  --  13.3  --   --  12.2  --   --   INR 1.8*  --  1.0  --  1.0  --   --  0.9  --   --   HEPARINUNFRC  --   --   --   --   --   --   --   --   --  <0.10*  CREATININE  --    < > 1.02*  --   --   --  1.13*  --  1.07*  --    < > = values in this interval not displayed.    Estimated Creatinine Clearance: 58.5 mL/min (A) (by C-G formula based on SCr of 1.07 mg/dL (H)).   Medical History: Past Medical History:  Diagnosis Date   Allergic rhinitis    Anemia     after gastric bypass in 2018   Anxiety    Barrett's esophagus    Bipolar affective disorder (HCC)    CAP (community acquired pneumonia) 07/20/2018   Chronic  respiratory failure (HCC)    Constipation, chronic    Degenerative joint disease of spine    Depression    Diabetes mellitus without complication (HCC)    type 2    DM (diabetes mellitus) (HCC)    hypoglycemic per pt - no longer takes DM meds due to weight loss from gastric bypass in 2018   Dry eye    Family history of adverse reaction to anesthesia    nausea and vomiting   Gastroparesis    GERD (gastroesophageal reflux disease)    H/O shoulder replacement 08/11/2020   left shoulder   History of bariatric surgery 03/2017   History of hiatal hernia    HLD (hyperlipidemia) 03/12/2013   Hyperlipidemia    Hypertension  No HTN meds since weight loss from Gastric Bypass in 2018   Intractable chronic migraine without aura 06/04/2015   Migraine headache    Morbid obesity (HCC)    OSA (obstructive sleep apnea)    No cpap since gastric surgery   Osteoarthritis    bilateral knee   SBO (small bowel obstruction) (HCC) 03/19/2019   Sleep apnea    Unspecified hypothyroidism    Vitamin B 12 deficiency    Vitamin D  deficiency     Medications:  Scheduled:   sodium chloride    Intravenous Once   Chlorhexidine  Gluconate Cloth  6 each Topical Daily   hydrocortisone  sod succinate (SOLU-CORTEF ) inj  100 mg Intravenous BID   insulin  aspart  0-15 Units Subcutaneous Q4H   mouth rinse  15 mL Mouth Rinse Q2H   pantoprazole  (PROTONIX ) IV  40 mg Intravenous Q12H   sterile water  (preservative free)       thiamine  (VITAMIN B1) injection  100 mg Intravenous Q24H   Infusions:   DOBUTamine  2.5 mcg/kg/min (11/02/24 2100)   fentaNYL  infusion INTRAVENOUS Stopped (11/02/24 1542)   heparin  500 Units/hr (11/02/24 2100)   micafungin  (MYCAMINE ) 100 mg in sodium chloride  0.9 % 100 mL IVPB 100 mg (11/02/24 1018)   piperacillin -tazobactam (ZOSYN )  IV 3.375 g (11/02/24 2112)   potassium chloride  10 mEq (11/02/24 2104)   propofol  (DIPRIVAN ) infusion 40 mcg/kg/min (11/02/24 2100)    Assessment: 58 yo F  presenting with E. Coli bacteremia 2/2 SBO with perforation. On 11/22 underwent closure of abdomen in OR and repair of left radial artery. Not on AC PTA. Pharmacy consulted for heparin  management.  Per vascular surgery, plan to keep heparin  infusion at 500 units/hr and monitor platelets closely with significant thrombocytopenia this admission. Received 2 units of platelets 11/22 -heparin  level < 0.1.  -plt 44> 84  Goal of Therapy:  Heparin  level < 0.1 units/ml Monitor platelets by anticoagulation protocol: Yes   Plan:  Continue heparin  infusion at 500 units/hr with no titration Monitor daily heparin  level, CBC, and signs/symptoms of bleeding  Prentice Poisson, PharmD Clinical Pharmacist **Pharmacist phone directory can now be found on [email or web address] ([email or web address]).  Listed under [facility].

## 2024-11-02 NOTE — Anesthesia Preprocedure Evaluation (Addendum)
 Anesthesia Evaluation  Patient identified by MRN, date of birth, ID band  Reviewed: NPO status , Patient's Chart, lab work & pertinent test results, Unable to perform ROS - Chart review only  History of Anesthesia Complications (+) Family history of anesthesia reactionNegative for: history of anesthetic complications  Airway Mallampati: Intubated       Dental  (+) Teeth Intact, Dental Advisory Given   Pulmonary sleep apnea , pneumonia, resolved Intubated on mechanical ventilation    breath sounds clear to auscultation       Cardiovascular hypertension,  Rhythm:Regular Rate:Tachycardia  Historically normal biventricular function, but now with new LVEF of 20% in the setting of septic shock Coox nadir mid 40s prior to cards starting dobutamine .   Remains on dobutamine  at 2.5, off NE and Vaso,  Mixed septic / cardiogenic shock picture.     Neuro/Psych  Headaches PSYCHIATRIC DISORDERS Anxiety Depression Bipolar Disorder   Sedated with prop / fent  Neuromuscular disease    GI/Hepatic hiatal hernia,GERD  Medicated,,Bowel obstruction S/P Exploratory laparotomy, lysis of adhesions, G-tube 11/18 Remote Hx/o gastric sleeve Hx/o gastroparesis  Plan for ex lap closure today   Endo/Other  diabetes, Well Controlled, Type 2Hypothyroidism    Renal/GU   negative genitourinary   Musculoskeletal  (+) Arthritis ,  DJD   Abdominal Open abdomin s/p ex-lap   Peds  Hematology  (+) Blood dyscrasia, anemia Plt 8 overnight, s/p 2 units now up to 44. Plan for ongoing platelet transfusion perioperatively with possible DDAVP    Anesthesia Other Findings   Reproductive/Obstetrics                              Anesthesia Physical Anesthesia Plan  ASA: 4  Anesthesia Plan: General   Post-op Pain Management:    Induction: Intravenous and Inhalational  PONV Risk Score and Plan: 4 or greater and Ondansetron  and  Treatment may vary due to age or medical condition  Airway Management Planned: Oral ETT  Additional Equipment: Arterial line  Intra-op Plan:   Post-operative Plan: Post-operative intubation/ventilation  Informed Consent: I have reviewed the patients History and Physical, chart, labs and discussed the procedure including the risks, benefits and alternatives for the proposed anesthesia with the patient or authorized representative who has indicated his/her understanding and acceptance.     Consent reviewed with POA  Plan Discussed with: CRNA, Surgeon and Anesthesiologist  Anesthesia Plan Comments: (Phone consent with niece Harlene who has been the main point of contact throughout the patient's hospital stay)         Anesthesia Quick Evaluation

## 2024-11-02 NOTE — Progress Notes (Signed)
 Platelet count 44k Discussed right radial thrombectomy in an effort to salvage marginal tissue with Amazin's POA, her niece. After discussing her benefits, she elected to proceed.  This will be coordinated with Dr. Tanda due to the open abdomen.  She is aware that Dr. Tanda will join me in the operating room, and that my consent form will also involve abdominal closure.  After discussing risks and benefits, Harlene elected to proceed.  Rosalva Neary E Chuckie Mccathern

## 2024-11-02 NOTE — Anesthesia Postprocedure Evaluation (Signed)
 Anesthesia Post Note  Patient: Morgan [name]  Procedure(s) Performed: LAPAROTOMY, RE-EXPLORATION IRRIGATION AND DEBRIDEMENT, WOUND, ABDOMEN, WITH CLOSURE APPLICATION, WOUND VAC THROMBECTOMY, ARTERY, RADIAL (Left: Arm Lower)     Patient location during evaluation: SICU Anesthesia Type: General Level of consciousness: sedated Pain management: pain level controlled Vital Signs Assessment: post-procedure vital signs reviewed and stable Respiratory status: patient remains intubated per anesthesia plan Cardiovascular status: stable Postop Assessment: no apparent nausea or vomiting Anesthetic complications: no   No notable events documented.  Last Vitals:  Vitals:   11/02/24 1305 11/02/24 1320  BP:  104/84  Pulse: 83 83  Resp: 15 15  Temp: (!) 35.5 C (!) 35.6 C  SpO2: 100%     Last Pain:  Vitals:   11/02/24 1305  TempSrc: Esophageal  PainSc:                  Thom JONELLE Peoples

## 2024-11-02 NOTE — Progress Notes (Addendum)
 NAME:  Morgan [name], MRN:  [id number], DOB:  05-05-1966, LOS: 4 ADMISSION DATE:  10/29/2024, CONSULTATION DATE:  10/30/2024 REFERRING MD:  Dr. Vernetta CHIEF COMPLAINT:  Small bowel Obstruction   Brief HPI  Briefly, 58 year old female with history of Roux-en-Y gastric bypass who presented to the emergency department yesterday evening with abdominal pain, lactic acidosis and lipase greater than 2800.  She had CCS evaluation and went for exploratory laparotomy.  Found to have dense adhesive band that was obstructing her proximal small bowel as well as biliary leak without evidence of perforation.  She had a gastrostomy tube placed in the left open with a wound VAC.  She is in continuity.  Also in septic shock, she returned from OR to ICU on Levophed , vasopressin , intubated on bicarb drip.  Pertinent  Medical History   Past Medical History:  Diagnosis Date   Allergic rhinitis    Anemia     after gastric bypass in 2018   Anxiety    Barrett's esophagus    Bipolar affective disorder (HCC)    CAP (community acquired pneumonia) 07/20/2018   Chronic respiratory failure (HCC)    Constipation, chronic    Degenerative joint disease of spine    Depression    Diabetes mellitus without complication (HCC)    type 2    DM (diabetes mellitus) (HCC)    hypoglycemic per pt - no longer takes DM meds due to weight loss from gastric bypass in 2018   Dry eye    Family history of adverse reaction to anesthesia    nausea and vomiting   Gastroparesis    GERD (gastroesophageal reflux disease)    H/O shoulder replacement 08/11/2020   left shoulder   History of bariatric surgery 03/2017   History of hiatal hernia    HLD (hyperlipidemia) 03/12/2013   Hyperlipidemia    Hypertension    No HTN meds since weight loss from Gastric Bypass in 2018   Intractable chronic migraine without aura 06/04/2015   Migraine headache    Morbid obesity (HCC)    OSA (obstructive sleep apnea)    No cpap since gastric  surgery   Osteoarthritis    bilateral knee   SBO (small bowel obstruction) (HCC) 03/19/2019   Sleep apnea    Unspecified hypothyroidism    Vitamin B 12 deficiency    Vitamin D  deficiency      Significant Hospital Events: Including procedures, antibiotic start and stop dates in addition to other pertinent events   11/18 admit 11/20 back to OR for look, weaning pressors  11/22 Trx to 2H due to left hand ischemia, tentative place for abdominal closure later today   Interim History / Subjective:   Patient transferred from [facility] ICU to 2H per Vascular Team's request for management of left hand ischemia.   O2 Sats 88% upon arrival, in no respiratory distress on vent  RT increased vent from 30>50>60%   Hypoglycemic on arrival- CBG 50     Objective    Blood pressure 108/66, pulse 72, temperature (!) 95.4 F (35.2 C), resp. rate 14, height 5' 4 (1.626 m), weight 77.8 kg, last menstrual period 10/17/2017, SpO2 100%. CVP:  [3 mmHg-11 mmHg] 3 mmHg  Vent Mode: PRVC FiO2 (%):  [30 %-60 %] 60 % Set Rate:  [14 bmp-21 bmp] 14 bmp Vt Set:  [480 mL] 480 mL PEEP:  [5 cmH20] 5 cmH20 Plateau Pressure:  [13 cmH20-15 cmH20] 13 cmH20   Intake/Output Summary (Last 24 hours) at 11/02/2024  9947 Last data filed at 11/01/2024 2325 Gross per 24 hour  Intake 1904.87 ml  Output 2075 ml  Net -170.13 ml   Filed Weights   10/29/24 1434 11/01/24 0500 11/02/24 0030  Weight: 59.9 kg 78.7 kg 77.8 kg    Examination: General: acute on chronic critically ill female, lying in icu bed on vent  HEENT: Normocephalic, PERRLA intact, ETT, OG  Pink MM CV: s1,s2, RRR, no MRG, No JVD, right internal jugular in place, cap refill diminished > 3 secs distal upper and lower extremities   pulm: clear, diminished, no distress on vent, Fio2 increased from 40 to 60%, O2 Sats on arrival 88  Abs: bs absent, wound vac midline- to suction, serous output, GI tube to gravity, right lower abdomen purpura   Extremities: mottling  left hand, moves purposefully to painful stimuli throughout  Skin: no rash  Neuro: Rass -3, responds to painful stimuli, cough gag reflex present  GU: foley intact     Resolved problem list   Assessment and Plan   Small bowel obstruction status post exploratory laparotomy with lysis of adhesions Status post gastrostomy tube and wound VAC Pancreatitis Elevated LFTs likely shock liver Moderate PCM - s/p washout 11/20 P:  Plan to return to OR On 11/22 for abdominal closure Continue zosyn  and micafungin   LFTs trending down, continue to trend, repeat CMP  Continue Gtube to gravity/straight drain Possible will need TPN vs tube feeds via Gtube- defer to CCS Continue thiamine   Follow up with vitamin levels  Continue to trend and replace electrolytes as needed   Shock - septic, r/o cardiogenic Acute systolic HF- concerning given CAD hx vs septic cardiomyopathy.  Lactic acidosis- clearing 3.3>1.7>1.4  E. Coli bacteremia  Elevated Troponins- 11/21 555>481  BNP > 35,000 Recent CT calcium  score 8/25 w/ CAC 176 (97th percentile) with calcification in LAD and RCA  11/20 TTE > EF 20-25%, +RWMA (lateral wall, anterior septum, inferoseptal apical anterior segment, apical inferior), normal RV, trivial MR P:  Off levo and vaso, continue MAP >65  Continue monitoring CVP q 4- current CVP 5  If pressors have to be restarted, recheck lactic acid STAT Cardiology following appreciate recs, notify in AM of patient now at [facility]  Due to thrombocytopenia, patient unable to utilize heparin   SCDs for VTE ppx- has chronic LLE DVT- place SCD on right, hold left for now  Repeat Coox in AM, continue dobutamine    Left hand ischemia -previous left radial aline, d/c'd 11/20 after non-functioning with progressive ischemia and absent radial signals, brachial signals present.  Per report, documentation- mottling has improved since afternoon of 11/21  11/21- VAS US  of upper extremity (L)- left radial artery distal  segment occluded -VAS US  of upper extremities No DVT in R/L but chronic B/L superficial venous thrombi -VAS US  of lower extremities- no DVT in right, but chronic DVT involving posterior tibial and peroneal veins  P: Patient transferred from [facility] ICU to [facility] 2H for further workup and possible intervention per Vascular team- if thrombocytopenia improves  Hold off arterial sticks in setting of thrombocytopenia, obtain VBG Vascular following appreciate assistance Repeat CBC, Type and Screen- may need additional plt admin   Acute blood loss anemia Severe thrombocytopenia- suspect related to overall shock/consumptive process of sepsis  Leukopenia, resolved  - s/p transfusion 2 plts 1/20- plts now 15,000 Combs neg Unfractionated bili 0.4  LDH 418  Folate WNL Reticulocyte count normal  No signs of bleeding, not had exposure to heparin  P:  Obtain  type and screen Repeat CBC in AM- trend hgb, plts Repeat DIC panel  Monitor for signs of bleeding HIT antibody still in process  Acute hypoxic respiratory failure postoperatively LLL PNA- suspect aspiration from SBO Repeat  P:  Continue ventilator support and lung protective strategies  Continue LTVV  Wean PEEP and Fio2 requirements to sat goal of >92%  HOB > 30 degrees Plat < 30  Aim for Driving pressures < 15  Intermittent Chest X-rays  VAP and PAD protocols in place  Hold off weaning sedation, SBT- due to open abdomen- once CCS clears, should be able to begin wean  Continue prop and fentanyl  Repeat Chest X-ray, Obtain VBG, hold of arterial sticks due to severe thrombocytopenia at this time O2 Sats have improved since arrival, can decrease Fio2 per O2 Sats   Hypoglycemia  Type 2 diabetes mellitus Atleast hypoglycemic events x2, CBGs consistently below goal  P: Continue to trend CBGs, q4 DC insulin  for now, if CBGs increase and not within goal of 140-180, restart SSI  Treat hypoglycemia per protocol    Acute kidney injury Metabolic  acidosis, resolved Hypokalemia, resolved Hypocalcemia  Hyponatremia bicarb w/ D5 stopped 11/21.  Remains Net +13L but does not appear clinically overloaded on exam 11/22 Still >13.3 L positive, CVP 5 P: Continue to trend renal function daily  Continue to monitor and optimize electrolytes daily Continue to monitor urine output Continue strict I/Os Continue Adequate renal perfusion  Avoid nephrotoxic agents       Labs   CBC: Recent Labs  Lab 10/29/24 1541 10/29/24 2131 10/31/24 0446 10/31/24 0736 10/31/24 0931 10/31/24 0945 10/31/24 1407 11/01/24 0450 11/01/24 0807 11/01/24 1544  WBC 1.1*   < > 11.6* 11.6*  --   --  12.7* 14.3*  --  9.6  NEUTROABS 1.0*  --   --  10.5*  --   --   --   --   --   --   HGB 13.0   < > 12.1 12.4 11.9*  --  11.3* 11.0* 11.2* 10.0*  HCT 42.4   < > 35.7* 38.0 35.0*  --  33.3* 32.6* 33.0* 30.2*  MCV 94.4   < > 85.0 88.8  --   --  85.2 85.8  --  88.0  PLT 362   < > 29* 45*  --  19* 37* 10*  --  15*   < > = values in this interval not displayed.    Basic Metabolic Panel: Recent Labs  Lab 10/29/24 1541 10/29/24 2131 10/30/24 0220 10/30/24 0921 10/31/24 0446 10/31/24 0614 10/31/24 0931 10/31/24 1837 11/01/24 0450 11/01/24 0807  NA 142   < > 137  --  133*  --  130* 128* 126* 128*  K 3.3*   < > 3.2*  --  2.9*  --  2.9* 3.5 3.9 3.5  CL 108  --  108  --  96*  --   --  91* 90*  --   CO2 21*  --  19*  --  27  --   --  30 28  --   GLUCOSE 141*  --  241*  --  136*  --   --  65* 83  --   BUN 25*  --  27*  --  26*  --   --  25* 28*  --   CREATININE 1.12*  --  1.47*  --  1.12*  --   --  0.99 1.02*  --   CALCIUM   7.9*  --  7.5*  --  6.3*  --   --  7.0* 7.1*  --   MG 1.7  --   --  1.5* 2.0  --   --   --  2.1  --   PHOS  --   --   --   --   --  3.6  --   --  4.8*  --    < > = values in this interval not displayed.   GFR: Estimated Creatinine Clearance: 61.4 mL/min (A) (by C-G formula based on SCr of 1.02 mg/dL (H)). Recent Labs  Lab  10/31/24 0736 10/31/24 1008 10/31/24 1240 10/31/24 1407 11/01/24 0450 11/01/24 1544  WBC 11.6*  --   --  12.7* 14.3* 9.6  LATICACIDVEN  --  3.0* 3.3*  --  1.7 1.4    Liver Function Tests: Recent Labs  Lab 10/29/24 1541 10/30/24 1141 11/01/24 0450 11/01/24 0920  AST 383* 123* 124*  --   ALT 272* 130* 103*  --   ALKPHOS 127* 48 62  --   BILITOT 0.3 <0.2 0.7 0.7  PROT 5.6* 3.4* 4.3*  --   ALBUMIN  3.4* 2.2* 2.1*  --    Recent Labs  Lab 10/29/24 1541 10/30/24 0220 10/31/24 0446 11/01/24 0450  LIPASE >2,800* 882* 78* 22   No results for input(s): AMMONIA in the last 168 hours.  ABG    Component Value Date/Time   PHART 7.536 (H) 11/01/2024 0807   PCO2ART 31.8 (L) 11/01/2024 0807   PO2ART 93 11/01/2024 0807   HCO3 30.6 (H) 11/01/2024 1544   TCO2 28 11/01/2024 0807   ACIDBASEDEF 6.7 (H) 10/30/2024 1055   O2SAT 74.9 11/01/2024 1544   O2SAT 85.7 11/01/2024 1544     Coagulation Profile: Recent Labs  Lab 10/29/24 1710 10/31/24 0945 11/01/24 0450  INR 1.0 1.8* 1.0    Cardiac Enzymes: No results for input(s): CKTOTAL, CKMB, CKMBINDEX, TROPONINI in the last 168 hours.  HbA1C: HB A1C (BAYER DCA - WAIVED)  Date/Time Value Ref Range Status  06/28/2024 08:40 AM 5.1 4.8 - 5.6 % Final    Comment:             Prediabetes: 5.7 - 6.4          Diabetes: >6.4          Glycemic control for adults with diabetes: <7.0   11/29/2023 08:24 AM 5.8 (H) 4.8 - 5.6 % Final    Comment:             Prediabetes: 5.7 - 6.4          Diabetes: >6.4          Glycemic control for adults with diabetes: <7.0    Hgb A1c MFr Bld  Date/Time Value Ref Range Status  10/30/2024 02:19 AM 5.3 4.8 - 5.6 % Final    Comment:    (NOTE) Diagnosis of Diabetes The following HbA1c ranges recommended by the American Diabetes Association (ADA) may be used as an aid in the diagnosis of diabetes mellitus.  Hemoglobin             Suggested A1C NGSP%              Diagnosis  <5.7                    Non Diabetic  5.7-6.4                Pre-Diabetic  >6.4  Diabetic  <7.0                   Glycemic control for                       adults with diabetes.      CBG: Recent Labs  Lab 11/01/24 1058 11/01/24 1552 11/01/24 1945 11/02/24 0017 11/02/24 0035  GLUCAP 85 97 71 50* 132*    Allergies Allergies  Allergen Reactions   Ketoconazole Hives, Swelling and Other (See Comments)    SWELLING REACTION UNSPECIFIED    Pravachol  [Pravastatin  Sodium] Shortness Of Breath, Swelling and Anaphylaxis    Throat swelling   Statins Other (See Comments)    Leg cramps   Tape Dermatitis and Other (See Comments)    Steri-Strips   Repatha  [Evolocumab ] Other (See Comments)    Made the stomach hurt badly.   Codeine Other (See Comments) and Hypertension    Increased B/P     Home Medications  Prior to Admission medications   Medication Sig Start Date End Date Taking? Authorizing Provider  Blood Pressure Monitoring KIT Check blood pressure daily 10/03/24   Swinyer, Rosaline HERO, NP  Calcium -Vitamin D -Vitamin K  650-12.5-40 MG-MCG-MCG CHEW Chew 1 each by mouth 2 (two) times daily.    [provider]  cyclobenzaprine  (FLEXERIL ) 10 MG tablet Take 1 tablet (10 mg total) by mouth 3 (three) times daily as needed for muscle spasms. 06/28/24   Jolinda Potter M, DO  desloratadine  (CLARINEX ) 5 MG tablet TAKE ONE TABLET DAILY 05/13/24   Jolinda Potter M, DO  dicyclomine  (BENTYL ) 20 MG tablet TAKE 1 TABLET EVERY 8 HOURS AS NEEDED FOR ABDOMINAL CRAMPING 12/29/23   Jolinda Potter M, DO  Erenumab -aooe 140 MG/ML SOAJ Inject 140 mg into the skin every 30 (thirty) days. 06/24/24   Lomax, Amy, NP  Evolocumab  (REPATHA  SURECLICK) 140 MG/ML SOAJ Inject 140 mg into the skin every 14 (fourteen) days. 09/23/24   Swinyer, Rosaline HERO, NP  ezetimibe  (ZETIA ) 10 MG tablet TAKE ONE TABLET DAILY 08/13/24   Jolinda Potter M, DO  gabapentin  (NEURONTIN ) 300 MG capsule Takes 2 capsules in  morning, one cap at lunch, 2 caps at bedtime 06/03/24   Lomax, Amy, NP  Glucagon , rDNA, (GLUCAGON  EMERGENCY IJ) Inject 1 Syringe as directed daily as needed (emergency low blood sugar).     [provider]  glucose blood (ONETOUCH ULTRA) test strip Check BS four times a day Dx E11.9 09/11/23   Jolinda Potter M, DO  levothyroxine  (SYNTHROID ) 112 MCG tablet TAKE ONE TABLET ONCE DAILY BEFORE BREAKFAST 07/02/24   Jolinda Potter M, DO  linaclotide  (LINZESS ) 290 MCG CAPS capsule Take 1 capsule (290 mcg total) by mouth daily before breakfast. 07/02/24   Jolinda Potter M, DO  loratadine  (CLARITIN ) 10 MG tablet Take 10 mg by mouth daily as needed for allergies.    [provider]  metoprolol  tartrate (LOPRESSOR ) 50 MG tablet Take 1 tablet (50 mg total) by mouth once for 1 dose. Take 90-120 minutes prior to scan. Hold for SBP less than 110. 09/23/24 09/23/24  Swinyer, Rosaline HERO, NP  mometasone  (NASONEX ) 50 MCG/ACT nasal spray USE 2 SPRAYS IN EACH NOSTRIL ONCE DAILY (REPLACES FLONASE ) 07/30/24   Jolinda Potter HERO, DO  Multiple Vitamins-Minerals (BARIATRIC MULTIVITAMINS/IRON  PO) Take 1 tablet by mouth daily.    [provider]  MYRBETRIQ  25 MG TB24 tablet TAKE ONE TABLET DAILY 08/13/24   Jolinda Potter M, DO  nystatin  (MYCOSTATIN /NYSTOP )  powder APPLY TO AFFECTED AREA OF GROIN RASH TWICE A DAY FOR 7 TO 10 DAYS PER FLARE 11/29/23   Jolinda Potter M, DO  pantoprazole  (PROTONIX ) 40 MG tablet TAKE ONE TABLET DAILY 07/22/24   Jolinda Potter M, DO  polyethylene glycol (MIRALAX  / GLYCOLAX ) packet Take 17 g by mouth at bedtime.    [provider]  Probiotic Product (PROBIOTIC DAILY PO) Take 1 capsule by mouth daily.    [provider]  sertraline  (ZOLOFT ) 100 MG tablet TAKE TWO TABLETS DAILY AS DIRECTED 07/02/24   Jolinda Potter M, DO  Simethicone  (GAS-X EXTRA STRENGTH) 125 MG CAPS Take 125 mg by mouth in the morning, at noon, and at bedtime.    [provider]  SUMAtriptan  (IMITREX ) 100 MG tablet Take 1 tablet (100 mg total) by mouth once as needed for up to 1 dose for migraine. May repeat in 2 hours if headache persists or recurs. 06/03/24   Lomax, Amy, NP  ziprasidone  (GEODON ) 60 MG capsule TAKE ONE CAPSULE AT BEDTIME 08/13/24   Jolinda Potter HERO, DO      CC: 50 mins     Christian Smith AGACNP-BC   [facility] 11/02/2024, 1:24 AM  Please see [email or web address] for pager details.  From 7A-7P if no response, please call [phone]. After hours, please call ELink [phone].     Attending attestation:  Ms. Diviney is a 58 y/o woman with a history of obesity and bariatric surgery who presented with SBO requiring ex-lap. She had profound septic shock requiring high dose pressors and developed digital ischemia distal to her L radial arterial line. She was transferred for VVS management. Platelets given overnight; able to go to OR with VVS & GS today.  BP 110/78   Pulse 83   Temp (!) 95.9 F (35.5 C) (Esophageal)   Resp 15   Ht 5' 4 (1.626 m)   Wt 77.8 kg   LMP 10/17/2017 (Approximate)   SpO2 100%   BMI 29.44 kg/m  Critically ill appearing woman lying in bed intubated, sedated [place]/AT, eyes anicteric Breathing comfortably on MV, CTAB S1S2, RRR Abd with wound vac, NT Left hand with purple thumb, induration/ sponginess to the thenar eminance. Areas of ecchymoses over several MCP joints and proximally over medial wrist. R hand mottled but not ecchymotic. RASS -5  Na+ 129 BUN 34 Cr 1.13 Phos 5.8 WBC 13.7 H/H 9.8/29.2 Platelets 44 Blood culture: E coli, pansensitive  Assessment & plan: Septic shock due to E coli bacteremia. Presume this was due to peritonitis and bowel obstruction from adhesions, s/p LOA, G-tube placement in gastric remnant, and possible perforation.  -second washout today, closed-- appreciate surgery's management -con't micafungin  and zosyn  -vasopressors as required to maintain  MAP>65 -trying to avoid peripheral vasodilators; con't DBA. Vasopressin  weaned off yesterday -con't SDS; trying to limit all peripheral vasoconstrictors  Acute HFrEF 20-25%; new since 2019 echo, suspect stress CM -DBA -appreciate cardiology's management -serial coox  Critical limb ischemia, nonviable thumb. Not sure how much of her hand will be viable.  Arterial line and high dose pressors are the unfortunate culprits here.  -appreciate VVS management-- OR today-- had embolectomy proximally and distally from Aline access site -monitor hand, may rethrombose -can start heparin  once stable enough platelets  Severe thrombocytopenia due to sepsis -platelets given before OR -trend -con't treatment of sepsis  Acute respiratory failure with hypoxia requiring MV -LTVV -VAP prevention protocol -PAD protocol for sedation -daily SAT & SBT as appropriate> ok  post-op today once paralytics wear off  Anemia of critical ilness -transfuse for Hb <7 or hemodynamically significant bleeding  Hyperphosphatemia -monitor  Hyponatremia -avoid hypotonic fluids -eventually needs diuresis  History of bariatric surgery, high risk for malnutrition -will advance diet as able post-op  Hyperglycemia -SSI PRN -goal BG 140-180  This patient is critically ill with multiple organ system failure which requires frequent high complexity decision making, assessment, support, evaluation, and titration of therapies. This was completed through the application of advanced monitoring technologies and extensive interpretation of multiple databases. During this encounter critical care time was devoted to patient care services described in this note for 44 minutes.  Leita SHAUNNA Gaskins, DO 11/02/24 1:35 PM [facility]  For contact information, see Amion. If no response to pager, please call PCCM consult pager. After hours, 7PM- 7AM, please call Elink.

## 2024-11-02 NOTE — Plan of Care (Signed)
  Problem: Clinical Measurements: Goal: Ability to maintain clinical measurements within normal limits will improve Outcome: Progressing Goal: Diagnostic test results will improve Outcome: Progressing Goal: Respiratory complications will improve Outcome: Progressing Goal: Cardiovascular complication will be avoided Outcome: Progressing   Problem: Coping: Goal: Level of anxiety will decrease Outcome: Progressing   

## 2024-11-03 DIAGNOSIS — R7401 Elevation of levels of liver transaminase levels: Secondary | ICD-10-CM | POA: Diagnosis not present

## 2024-11-03 DIAGNOSIS — I5020 Unspecified systolic (congestive) heart failure: Secondary | ICD-10-CM | POA: Diagnosis not present

## 2024-11-03 DIAGNOSIS — K56609 Unspecified intestinal obstruction, unspecified as to partial versus complete obstruction: Secondary | ICD-10-CM | POA: Diagnosis not present

## 2024-11-03 DIAGNOSIS — R578 Other shock: Secondary | ICD-10-CM | POA: Diagnosis not present

## 2024-11-03 DIAGNOSIS — K859 Acute pancreatitis without necrosis or infection, unspecified: Secondary | ICD-10-CM | POA: Diagnosis not present

## 2024-11-03 DIAGNOSIS — E440 Moderate protein-calorie malnutrition: Secondary | ICD-10-CM | POA: Diagnosis not present

## 2024-11-03 DIAGNOSIS — Z48812 Encounter for surgical aftercare following surgery on the circulatory system: Secondary | ICD-10-CM

## 2024-11-03 LAB — BASIC METABOLIC PANEL WITH GFR
Anion gap: 12 (ref 5–15)
BUN: 36 mg/dL — ABNORMAL HIGH (ref 6–20)
CO2: 27 mmol/L (ref 22–32)
Calcium: 7.2 mg/dL — ABNORMAL LOW (ref 8.9–10.3)
Chloride: 96 mmol/L — ABNORMAL LOW (ref 98–111)
Creatinine, Ser: 0.95 mg/dL (ref 0.44–1.00)
GFR, Estimated: >60 mL/min (ref >60)
Glucose, Bld: 180 mg/dL — ABNORMAL HIGH (ref 70–99)
Potassium: 4 mmol/L (ref 3.5–5.1)
Sodium: 135 mmol/L (ref 135–145)

## 2024-11-03 LAB — PREPARE PLATELET PHERESIS
Unit division: 0
Unit division: 0
Unit division: 0

## 2024-11-03 LAB — COMPREHENSIVE METABOLIC PANEL WITH GFR
ALT: 66 U/L — ABNORMAL HIGH (ref 0–44)
AST: 80 U/L — ABNORMAL HIGH (ref 15–41)
Albumin: 1.6 g/dL — ABNORMAL LOW (ref 3.5–5.0)
Alkaline Phosphatase: 54 U/L (ref 38–126)
Anion gap: 13 (ref 5–15)
BUN: 40 mg/dL — ABNORMAL HIGH (ref 6–20)
CO2: 29 mmol/L (ref 22–32)
Calcium: 7.2 mg/dL — ABNORMAL LOW (ref 8.9–10.3)
Chloride: 91 mmol/L — ABNORMAL LOW (ref 98–111)
Creatinine, Ser: 1.2 mg/dL — ABNORMAL HIGH (ref 0.44–1.00)
GFR, Estimated: 53 mL/min — ABNORMAL LOW (ref >60)
Glucose, Bld: 86 mg/dL (ref 70–99)
Potassium: 3.8 mmol/L (ref 3.5–5.1)
Sodium: 133 mmol/L — ABNORMAL LOW (ref 135–145)
Total Bilirubin: 1.3 mg/dL — ABNORMAL HIGH (ref 0.0–1.2)
Total Protein: 4.2 g/dL — ABNORMAL LOW (ref 6.5–8.1)

## 2024-11-03 LAB — BPAM PLATELET PHERESIS
Blood Product Expiration Date: 202511222359
Blood Product Expiration Date: 202511242359
Blood Product Expiration Date: 202511222359
Blood Product Expiration Date: 202511242359
ISSUE DATE / TIME: 202511220257
ISSUE DATE / TIME: 202511220524
ISSUE DATE / TIME: 202511220944
ISSUE DATE / TIME: 202511221038
Unit Type and Rh: 5100
Unit Type and Rh: 6200
Unit Type and Rh: 5100
Unit Type and Rh: 5100

## 2024-11-03 LAB — GLUCOSE, CAPILLARY
Glucose-Capillary: 95 mg/dL (ref 70–99)
Glucose-Capillary: 94 mg/dL (ref 70–99)
Glucose-Capillary: 123 mg/dL — ABNORMAL HIGH (ref 70–99)
Glucose-Capillary: 113 mg/dL — ABNORMAL HIGH (ref 70–99)
Glucose-Capillary: 134 mg/dL — ABNORMAL HIGH (ref 70–99)
Glucose-Capillary: 165 mg/dL — ABNORMAL HIGH (ref 70–99)

## 2024-11-03 LAB — MAGNESIUM: Magnesium: 2.2 mg/dL (ref 1.7–2.4)

## 2024-11-03 LAB — CBC
HCT: 27.5 % — ABNORMAL LOW (ref 36.0–46.0)
Hemoglobin: 9.2 g/dL — ABNORMAL LOW (ref 12.0–15.0)
MCH: 28.5 pg (ref 26.0–34.0)
MCHC: 33.5 g/dL (ref 30.0–36.0)
MCV: 85.1 fL (ref 80.0–100.0)
Platelets: 64 K/uL — ABNORMAL LOW (ref 150–400)
RBC: 3.23 MIL/uL — ABNORMAL LOW (ref 3.87–5.11)
RDW: 14.6 % (ref 11.5–15.5)
WBC: 11.1 K/uL — ABNORMAL HIGH (ref 4.0–10.5)
nRBC: 0.2 % (ref 0.0–0.2)

## 2024-11-03 LAB — HEPARIN INDUCED PLATELET AB (HIT ANTIBODY): Heparin Induced Plt Ab: 0.039 {OD_unit} (ref 0.000–0.400)

## 2024-11-03 LAB — TRIGLYCERIDES: Triglycerides: 205 mg/dL — ABNORMAL HIGH (ref <150)

## 2024-11-03 LAB — PHOSPHORUS: Phosphorus: 4.6 mg/dL (ref 2.5–4.6)

## 2024-11-03 LAB — HEPARIN LEVEL (UNFRACTIONATED): Heparin Unfractionated: <0.1 [IU]/mL — ABNORMAL LOW (ref 0.30–0.70)

## 2024-11-03 MED ORDER — LACTATED RINGERS IV BOLUS
500.0000 mL | Freq: Once | INTRAVENOUS | Status: AC
  Administered 2024-11-03: 500 mL via INTRAVENOUS

## 2024-11-03 MED ORDER — INSULIN ASPART 100 UNIT/ML IJ SOLN
0.0000 [IU] | INTRAMUSCULAR | Status: DC
  Administered 2024-11-03: 1 [IU] via SUBCUTANEOUS
  Administered 2024-11-03: 2 [IU] via SUBCUTANEOUS
  Administered 2024-11-03: 1 [IU] via SUBCUTANEOUS
  Administered 2024-11-04 (×2): 3 [IU] via SUBCUTANEOUS
  Administered 2024-11-04: 2 [IU] via SUBCUTANEOUS
  Administered 2024-11-04: 3 [IU] via SUBCUTANEOUS
  Administered 2024-11-04: 2 [IU] via SUBCUTANEOUS
  Administered 2024-11-05: 1 [IU] via SUBCUTANEOUS
  Administered 2024-11-05: 5 [IU] via SUBCUTANEOUS
  Administered 2024-11-05: 2 [IU] via SUBCUTANEOUS
  Filled 2024-11-03: qty 1
  Filled 2024-11-03: qty 2
  Filled 2024-11-03: qty 5
  Filled 2024-11-03: qty 1
  Filled 2024-11-03 (×2): qty 2
  Filled 2024-11-03: qty 3
  Filled 2024-11-03: qty 2
  Filled 2024-11-03: qty 1
  Filled 2024-11-03: qty 2
  Filled 2024-11-03: qty 3

## 2024-11-03 MED ORDER — FUROSEMIDE 10 MG/ML IJ SOLN
40.0000 mg | Freq: Once | INTRAMUSCULAR | Status: AC
  Administered 2024-11-03: 40 mg via INTRAVENOUS
  Filled 2024-11-03: qty 4

## 2024-11-03 MED ORDER — FENTANYL CITRATE (PF) 50 MCG/ML IJ SOSY
50.0000 ug | PREFILLED_SYRINGE | INTRAMUSCULAR | Status: DC | PRN
  Administered 2024-11-03: 100 ug via INTRAVENOUS

## 2024-11-03 MED ORDER — FENTANYL BOLUS VIA INFUSION
100.0000 ug | INTRAVENOUS | Status: DC | PRN
  Administered 2024-11-03 – 2024-11-04 (×3): 100 ug via INTRAVENOUS

## 2024-11-03 MED ORDER — TRACE MINERALS CU-MN-SE-ZN 300-55-60-3000 MCG/ML IV SOLN
INTRAVENOUS | Status: AC
  Filled 2024-11-03: qty 345.6

## 2024-11-03 MED ORDER — FENTANYL CITRATE (PF) 50 MCG/ML IJ SOSY
50.0000 ug | PREFILLED_SYRINGE | INTRAMUSCULAR | Status: AC | PRN
  Administered 2024-11-03 (×3): 50 ug via INTRAVENOUS
  Filled 2024-11-03 (×3): qty 1

## 2024-11-03 MED ORDER — DOCUSATE SODIUM 50 MG/5ML PO LIQD: 100.0000 mg | Freq: Two times a day (BID) | ORAL | Status: DC

## 2024-11-03 MED ORDER — POTASSIUM CHLORIDE 10 MEQ/50ML IV SOLN
10.0000 meq | INTRAVENOUS | Status: AC
Start: 2024-11-03 — End: 2024-11-03
  Administered 2024-11-03 (×2): 10 meq via INTRAVENOUS
  Filled 2024-11-03 (×2): qty 50

## 2024-11-03 MED ORDER — FENTANYL BOLUS VIA INFUSION: 50.0000 ug | INTRAVENOUS | Status: DC | PRN

## 2024-11-03 MED ORDER — VITAL HP 1.0 CAL PO LIQD
1000.0000 mL | ORAL | Status: DC
  Administered 2024-11-03: 1000 mL

## 2024-11-03 MED ORDER — FENTANYL 2500MCG IN NS 250ML (10MCG/ML) PREMIX INFUSION
0.0000 ug/h | INTRAVENOUS | Status: DC
  Administered 2024-11-03: 50 ug/h via INTRAVENOUS
  Administered 2024-11-04: 125 ug/h via INTRAVENOUS
  Filled 2024-11-03: qty 250

## 2024-11-03 MED ORDER — POLYETHYLENE GLYCOL 3350 17 G PO PACK: 17.0000 g | PACK | Freq: Every day | ORAL | Status: DC

## 2024-11-03 NOTE — Progress Notes (Signed)
 RT advanced ETT 4cm to 26@lips  per MD order. Patient tolerated well. RT re-secured ETT. RT will continue to monitor.

## 2024-11-03 NOTE — Progress Notes (Signed)
 PHARMACY - ANTICOAGULATION CONSULT NOTE  Pharmacy Consult for heparin  infusion Indication: postop vascular procedure  Allergies  Allergen Reactions   Ketoconazole Hives, Swelling and Other (See Comments)    SWELLING REACTION UNSPECIFIED    Pravachol  [Pravastatin  Sodium] Shortness Of Breath, Swelling and Anaphylaxis    Throat swelling   Statins Other (See Comments)    Leg cramps   Tape Dermatitis and Other (See Comments)    Steri-Strips   Repatha  [Evolocumab ] Other (See Comments)    Made the stomach hurt badly.   Codeine Other (See Comments) and Hypertension    Increased B/P    Patient Measurements: Height: 5' 4 (162.6 cm) Weight: 80.1 kg (176 lb 9.4 oz) IBW/kg (Calculated) : 54.7 HEPARIN  DW (KG): 59.9  Vital Signs: Temp: 99.3 F (37.4 C) (11/23 0745) Temp Source: Esophageal (11/23 0400) BP: 108/69 (11/23 0745) Pulse Rate: 103 (11/23 0745)  Labs: Recent Labs    10/31/24 0945 10/31/24 1407 11/01/24 0450 11/01/24 0807 11/02/24 0147 11/02/24 0247 11/02/24 0355 11/02/24 0853 11/02/24 1631 11/02/24 2053 11/03/24 0353  HGB  --    < > 11.0*   < >  --    < >  --  9.8* 9.8*  --  9.2*  HCT  --    < > 32.6*   < >  --    < >  --  29.2* 29.5*  --  27.5*  PLT 19*   < > 10*   < > 8*  --   --  44* 84*  --  64*  APTT 53*  --   --   --  29  --   --   --   --   --   --   LABPROT 21.9*  --  13.9  --  13.3  --   --  12.2  --   --   --   INR 1.8*  --  1.0  --  1.0  --   --  0.9  --   --   --   HEPARINUNFRC  --   --   --   --   --   --   --   --   --  <0.10* <0.10*  CREATININE  --    < > 1.02*  --   --   --  1.13*  --  1.07*  --  1.20*   < > = values in this interval not displayed.    Estimated Creatinine Clearance: 53 mL/min (A) (by C-G formula based on SCr of 1.2 mg/dL (H)).   Medical History: Past Medical History:  Diagnosis Date   Allergic rhinitis    Anemia     after gastric bypass in 2018   Anxiety    Barrett's esophagus    Bipolar affective disorder (HCC)     CAP (community acquired pneumonia) 07/20/2018   Chronic respiratory failure (HCC)    Constipation, chronic    Degenerative joint disease of spine    Depression    Diabetes mellitus without complication (HCC)    type 2    DM (diabetes mellitus) (HCC)    hypoglycemic per pt - no longer takes DM meds due to weight loss from gastric bypass in 2018   Dry eye    Family history of adverse reaction to anesthesia    nausea and vomiting   Gastroparesis    GERD (gastroesophageal reflux disease)    H/O shoulder replacement 08/11/2020   left shoulder   History of  bariatric surgery 03/2017   History of hiatal hernia    HLD (hyperlipidemia) 03/12/2013   Hyperlipidemia    Hypertension    No HTN meds since weight loss from Gastric Bypass in 2018   Intractable chronic migraine without aura 06/04/2015   Migraine headache    Morbid obesity (HCC)    OSA (obstructive sleep apnea)    No cpap since gastric surgery   Osteoarthritis    bilateral knee   SBO (small bowel obstruction) (HCC) 03/19/2019   Sleep apnea    Unspecified hypothyroidism    Vitamin B 12 deficiency    Vitamin D  deficiency     Medications:  Scheduled:   sodium chloride    Intravenous Once   Chlorhexidine  Gluconate Cloth  6 each Topical Daily   hydrocortisone  sod succinate (SOLU-CORTEF ) inj  100 mg Intravenous BID   insulin  aspart  0-15 Units Subcutaneous Q4H   mouth rinse  15 mL Mouth Rinse Q2H   pantoprazole  (PROTONIX ) IV  40 mg Intravenous Q12H   thiamine  (VITAMIN B1) injection  100 mg Intravenous Q24H   Infusions:   DOBUTamine  2.5 mcg/kg/min (11/03/24 0600)   fentaNYL  infusion INTRAVENOUS Stopped (11/02/24 1542)   heparin  500 Units/hr (11/03/24 0600)   micafungin  (MYCAMINE ) 100 mg in sodium chloride  0.9 % 100 mL IVPB 100 mg (11/02/24 1018)   piperacillin -tazobactam (ZOSYN )  IV 12.5 mL/hr at 11/03/24 0600   potassium chloride  10 mEq (11/03/24 0756)   propofol  (DIPRIVAN ) infusion Stopped (11/02/24 1901)    Assessment: 58  yo F presenting with E. Coli bacteremia 2/2 SBO with perforation. On 11/22 underwent closure of abdomen in OR and repair of left radial artery. Not on AC PTA. Pharmacy consulted for heparin  management.  Heparin  level today is < 0.1 as expected with low-dose fixed heparin  infusion. Per vascular surgery, plan to keep heparin  infusion at 500 units/hr and monitor platelets closely with significant thrombocytopenia this admission. Received 2 units of platelets 11/22. Hgb (9.2) is stable and platelets (64) remain low but slowly increasing. Per RN, no signs of bleeding.  Goal of Therapy:  Heparin  level < 0.1 units/ml Monitor platelets by anticoagulation protocol: Yes   Plan:  Continue heparin  infusion at 500 units/hr with no titration Monitor daily heparin  level, CBC, and signs/symptoms of bleeding F/u daily with vascular surgery to determine plan for titration   Thank you for allowing pharmacy to be a part of this patient's care.   Nidia Schaffer, PharmD PGY2 Cardiology Pharmacy Resident  Please check AMION for all [facility] Pharmacy phone numbers After 10:00 PM, call Main Pharmacy [phone] 11/03/2024,8:16 AM

## 2024-11-03 NOTE — Progress Notes (Signed)
 eLink Physician-Brief Progress Note Patient Name: Morgan [name] DOB: 1966-11-28 MRN: [id number]   Date of Service  11/03/2024  HPI/Events of Note  Mechanically ventilated on sedation  eICU Interventions  Switch fentanyl  IV pushes to bolus from bag   0630 - kcl, magnesium , tpn per primary team  Intervention Category Minor Interventions: Routine modifications to care plan (e.g. PRN medications for pain, fever)  Saylor Sheckler 11/03/2024, 10:40 PM

## 2024-11-03 NOTE — Plan of Care (Signed)
  Problem: Clinical Measurements: Goal: Diagnostic test results will improve Outcome: Progressing Goal: Respiratory complications will improve Outcome: Progressing Goal: Cardiovascular complication will be avoided Outcome: Progressing   Problem: Activity: Goal: Risk for activity intolerance will decrease Outcome: Progressing   Problem: Nutrition: Goal: Adequate nutrition will be maintained Outcome: Progressing   Problem: Elimination: Goal: Will not experience complications related to urinary retention Outcome: Progressing   Problem: Pain Managment: Goal: General experience of comfort will improve and/or be controlled Outcome: Progressing

## 2024-11-03 NOTE — Progress Notes (Signed)
 Advanced Heart Failure Rounding Note   Subjective:    11/22 To OR after ab wound closure and revasc of LUE   Remains intubated/sedated. Failed vent wean today  POCUS ECHO EF 30-35% with RWMA   On DBA 4. No co-ox today     Objective:   Weight Range:  Vital Signs:   Temp:  [95.9 F (35.5 C)-99.3 F (37.4 C)] 97.3 F (36.3 C) (11/23 1000) Pulse Rate:  [83-106] 90 (11/23 1000) Resp:  [5-35] 17 (11/23 1000) BP: (100-121)/(66-84) 113/66 (11/23 1000) SpO2:  [94 %-100 %] 99 % (11/23 1000) FiO2 (%):  [40 %] 40 % (11/23 0745) Weight:  [80.1 kg] 80.1 kg (11/23 0500) Last BM Date :  (PTA)  Weight change: Filed Weights   11/01/24 0500 11/02/24 0030 11/03/24 0500  Weight: 78.7 kg 77.8 kg 80.1 kg    Intake/Output:   Intake/Output Summary (Last 24 hours) at 11/03/2024 1030 Last data filed at 11/03/2024 1000 Gross per 24 hour  Intake 2454.29 ml  Output 1068 ml  Net 1386.29 ml     Physical Exam: General:  intubated/sedated HEENT: normal +ETT Neck: supple. no JVD.  Cor: Regular rate & rhythm. No rubs, gallops or murmurs. Lungs: clear Abdomen: soft, midline wound vac Extremities: no edema. L thumb ischemic with blisters Neuro:sedated   Telemetry: Sinus 70s Personally reviewed  Labs: Basic Metabolic Panel: Recent Labs  Lab 10/30/24 0921 10/31/24 0446 10/31/24 0614 10/31/24 0931 10/31/24 1837 11/01/24 0450 11/01/24 0807 11/02/24 0136 11/02/24 0247 11/02/24 0355 11/02/24 1631 11/03/24 0353  NA  --  133*  --    < > 128* 126*   < > 131* 132* 129* 132* 133*  K  --  2.9*  --    < > 3.5 3.9   < > 3.4* 3.5 3.6 3.7 3.8  CL  --  96*  --   --  91* 90*  --   --   --  88* 90* 91*  CO2  --  27  --   --  30 28  --   --   --  30 25 29   GLUCOSE  --  136*  --   --  65* 83  --   --   --  95 76 86  BUN  --  26*  --   --  25* 28*  --   --   --  34* 35* 40*  CREATININE  --  1.12*  --   --  0.99 1.02*  --   --   --  1.13* 1.07* 1.20*  CALCIUM   --  6.3*  --   --  7.0* 7.1*   --   --   --  6.9* 7.2* 7.2*  MG 1.5* 2.0  --   --   --  2.1  --   --   --  2.1  --  2.2  PHOS  --   --  3.6  --   --  4.8*  --   --   --  5.8*  --  4.6   < > = values in this interval not displayed.    Liver Function Tests: Recent Labs  Lab 10/29/24 1541 10/30/24 1141 11/01/24 0450 11/01/24 0920 11/02/24 0355 11/03/24 0353  AST 383* 123* 124*  --  78* 80*  ALT 272* 130* 103*  --  72* 66*  ALKPHOS 127* 48 62  --  44 54  BILITOT 0.3 <0.2 0.7 0.7 0.8 1.3*  PROT 5.6* 3.4* 4.3*  --  3.8* 4.2*  ALBUMIN  3.4* 2.2* 2.1*  --  <1.5* 1.6*   Recent Labs  Lab 10/29/24 1541 10/30/24 0220 10/31/24 0446 11/01/24 0450  LIPASE >2,800* 882* 78* 22   No results for input(s): AMMONIA in the last 168 hours.  CBC: Recent Labs  Lab 10/29/24 1541 10/29/24 2131 10/31/24 0736 10/31/24 0931 11/01/24 0450 11/01/24 0807 11/01/24 1544 11/02/24 0136 11/02/24 0147 11/02/24 0247 11/02/24 0853 11/02/24 1631 11/03/24 0353  WBC 1.1*   < > 11.6*   < > 14.3*  --  9.6  --   --   --  13.7* 12.9* 11.1*  NEUTROABS 1.0*  --  10.5*  --   --   --   --   --   --   --   --   --   --   HGB 13.0   < > 12.4   < > 11.0*   < > 10.0* 10.2*  --  10.5* 9.8* 9.8* 9.2*  HCT 42.4   < > 38.0   < > 32.6*   < > 30.2* 30.0*  --  31.0* 29.2* 29.5* 27.5*  MCV 94.4   < > 88.8   < > 85.8  --  88.0  --   --   --  85.6 86.0 85.1  PLT 362   < > 45*   < > 10*  --  15*  --  8*  --  44* 84* 64*   < > = values in this interval not displayed.    Cardiac Enzymes: No results for input(s): CKTOTAL, CKMB, CKMBINDEX, TROPONINI in the last 168 hours.  BNP: BNP (last 3 results) No results for input(s): BNP in the last 8760 hours.  ProBNP (last 3 results) Recent Labs    11/01/24 0920  PROBNP >35,000.0*      Other results:  Imaging: DG Chest Port 1 View Result Date: 11/02/2024 EXAM: 1 VIEW(S) XRAY OF THE CHEST 11/02/2024 01:48:08 AM COMPARISON: Earlier today. CLINICAL HISTORY: Acute respiratory failure (HCC).  FINDINGS: LINES, TUBES AND DEVICES: The endotracheal tube is 6.4 cm above the carina. Right PICC line tip in the SVC. Right internal jugular line tip in the SVC. LUNGS AND PLEURA: Bilateral pleural effusions with bibasilar airspace opacities, right greater than left, unchanged since prior study. No pneumothorax. HEART AND MEDIASTINUM: No acute abnormality of the cardiac and mediastinal silhouettes. BONES AND SOFT TISSUES: No acute osseous abnormality. IMPRESSION: 1. Bilateral pleural effusions with bibasilar airspace opacities, right greater than left, unchanged since prior study. Electronically signed by: Franky Crease MD 11/02/2024 02:05 AM EST RP Workstation: [id number]   VAS US  UPPER EXTREMITY ARTERIAL DUPLEX Result Date: 11/01/2024  UPPER EXTREMITY DUPLEX STUDY Patient Name:  LIVIA TARR [facility]  Date of Exam:   11/01/2024 Medical Rec #: [id number]         Accession #:    [id number] Date of Birth: 23-Apr-1968         Patient Gender: F Patient Age:   58 years Exam Location:  [facility] Procedure:      VAS US  UPPER EXTREMITY ARTERIAL DUPLEX Referring Phys: PAULA SIMPSON --------------------------------------------------------------------------------  Indications: Bruising and No radial artery doppler pulse.  Performing Technologist: Ricka Sturdivant-Jones RDMS, RVT  Examination Guidelines: A complete evaluation includes B-mode imaging, spectral Doppler, color Doppler, and power Doppler as needed of all accessible portions of each vessel. Bilateral testing is considered an integral part of a complete examination. Limited examinations for reoccurring  indications may be performed as noted.  Left Doppler Findings: +-------------+----------+----------+--------+----------------+ Site         PSV (cm/s)Waveform  StenosisComments         +-------------+----------+----------+--------+----------------+ Brachial Dist31        triphasic                           +-------------+----------+----------+--------+----------------+ Radial Prox  24        triphasic                          +-------------+----------+----------+--------+----------------+ Radial Mid   12        monophasic                         +-------------+----------+----------+--------+----------------+ Radial Dist  0         occluded          appears occluded +-------------+----------+----------+--------+----------------+ Ulnar Prox   34        triphasic                          +-------------+----------+----------+--------+----------------+ Ulnar Mid    22        triphasic                          +-------------+----------+----------+--------+----------------+ Ulnar Dist   19        triphasic                          +-------------+----------+----------+--------+----------------+   Summary:  Left: Left radial artery distal segment appears occluded. Doppler       flow absent. *See table(s) above for measurements and observations. Electronically signed by Fonda Rim on 11/01/2024 at 7:24:58 PM.    Final    VAS US  UPPER EXTREMITY VENOUS DUPLEX Result Date: 11/01/2024 UPPER VENOUS STUDY  Patient Name:  JASIMINE SIMMS [facility]  Date of Exam:   11/01/2024 Medical Rec #: [id number]         Accession #:    [id number] Date of Birth: July 03, 1966         Patient Gender: F Patient Age:   60 years Exam Location:  [facility] Procedure:      VAS US  UPPER EXTREMITY VENOUS DUPLEX Referring Phys: VINA SIMPSON --------------------------------------------------------------------------------  Indications: ICU patient - fever, septic shock Other Indications: Underwent exploratory laparotomy 10/31/24. Limitations: Line and bandages. Performing Technologist: Ricka Sturdivant-Jones RDMS, RVT  Examination Guidelines: A complete evaluation includes B-mode imaging, spectral Doppler, color Doppler, and power Doppler as needed of all accessible portions of each vessel. Bilateral testing is  considered an integral part of a complete examination. Limited examinations for reoccurring indications may be performed as noted.  Right Findings: +----------+------------+---------+-----------+----------+--------------+ RIGHT     CompressiblePhasicitySpontaneousProperties   Summary     +----------+------------+---------+-----------+----------+--------------+ IJV                                                 Not visualized +----------+------------+---------+-----------+----------+--------------+ Subclavian    Full       Yes       Yes                             +----------+------------+---------+-----------+----------+--------------+  Axillary      Full       Yes       Yes                             +----------+------------+---------+-----------+----------+--------------+ Brachial      Full                                                 +----------+------------+---------+-----------+----------+--------------+ Radial        Full                                                 +----------+------------+---------+-----------+----------+--------------+ Ulnar         Full                                                 +----------+------------+---------+-----------+----------+--------------+ Cephalic    Partial                                    Chronic     +----------+------------+---------+-----------+----------+--------------+ Basilic       Full                                                 +----------+------------+---------+-----------+----------+--------------+ IJV not visualized due to tape.  Left Findings: +----------+------------+---------+-----------+----------+-------+ LEFT      CompressiblePhasicitySpontaneousPropertiesSummary +----------+------------+---------+-----------+----------+-------+ IJV           Full       Yes       Yes                      +----------+------------+---------+-----------+----------+-------+ Subclavian     Full       Yes       Yes                      +----------+------------+---------+-----------+----------+-------+ Axillary      Full       Yes       Yes                      +----------+------------+---------+-----------+----------+-------+ Brachial      Full                                          +----------+------------+---------+-----------+----------+-------+ Radial        Full                                          +----------+------------+---------+-----------+----------+-------+ Ulnar         Full                                          +----------+------------+---------+-----------+----------+-------+  Cephalic    Partial                                 Chronic +----------+------------+---------+-----------+----------+-------+ Basilic       Full                                          +----------+------------+---------+-----------+----------+-------+  Summary:  Right: No evidence of deep vein thrombosis in the upper extremity. Findings consistent with chronic superficial vein thrombosis involving the right cephalic vein.  Left: No evidence of deep vein thrombosis in the upper extremity. Findings consistent with chronic superficial vein thrombosis involving the left cephalic vein.  *See table(s) above for measurements and observations.  Diagnosing physician: Fonda Rim Electronically signed by Fonda Rim on 11/01/2024 at 7:24:42 PM.    Final    VAS US  LOWER EXTREMITY VENOUS (DVT) Result Date: 11/01/2024  Lower Venous DVT Study Patient Name:  YOANNA JURCZYK [facility]  Date of Exam:   11/01/2024 Medical Rec #: [id number]         Accession #:    [id number] Date of Birth: 06-04-1966         Patient Gender: F Patient Age:   14 years Exam Location:  [facility] Procedure:      VAS US  LOWER EXTREMITY VENOUS (DVT) Referring Phys: VINA SIMPSON --------------------------------------------------------------------------------  Indications: Fever, septic  shock, and Edema.  Comparison Study: 05/24/19 - negative Performing Technologist: Ricka Sturdivant-Jones RDMS, RVT  Examination Guidelines: A complete evaluation includes B-mode imaging, spectral Doppler, color Doppler, and power Doppler as needed of all accessible portions of each vessel. Bilateral testing is considered an integral part of a complete examination. Limited examinations for reoccurring indications may be performed as noted. The reflux portion of the exam is performed with the patient in reverse Trendelenburg.  +---------+---------------+---------+-----------+----------+--------------+ RIGHT    CompressibilityPhasicitySpontaneityPropertiesThrombus Aging +---------+---------------+---------+-----------+----------+--------------+ CFV      Full           Yes      Yes                                 +---------+---------------+---------+-----------+----------+--------------+ SFJ      Full                                                        +---------+---------------+---------+-----------+----------+--------------+ FV Prox  Full                                                        +---------+---------------+---------+-----------+----------+--------------+ FV Mid   Full           Yes      Yes                                 +---------+---------------+---------+-----------+----------+--------------+ FV DistalFull                                                        +---------+---------------+---------+-----------+----------+--------------+  PFV      Full                                                        +---------+---------------+---------+-----------+----------+--------------+ POP      Full           Yes      Yes                                 +---------+---------------+---------+-----------+----------+--------------+ PTV      Partial                                      Chronic         +---------+---------------+---------+-----------+----------+--------------+ PERO     Partial                                      Chronic        +---------+---------------+---------+-----------+----------+--------------+    Summary: RIGHT: - There is no evidence of deep vein thrombosis in the lower extremity.  - No cystic structure found in the popliteal fossa.  LEFT: - Findings consistent with chronic deep vein thrombosis involving the left posterior tibial veins, and left peroneal veins.  - No cystic structure found in the popliteal fossa.  *See table(s) above for measurements and observations. Electronically signed by Fonda Rim on 11/01/2024 at 7:24:25 PM.    Final    DG CHEST PORT 1 VIEW Result Date: 11/01/2024 EXAM: 1 VIEW(S) XRAY OF THE CHEST 11/01/2024 11:08:00 AM COMPARISON: 10/30/2024 CLINICAL HISTORY: Acute hypoxic respiratory failure (HCC) [id number] FINDINGS: LINES, TUBES AND DEVICES: Right IJ CVC in place with tip overlying distal SVC. Endotracheal tube in place with tip 7.6 cm above carina. LUNGS AND PLEURA: Right basilar atelectasis or consolidation, new from prior exam. New Small to moderate right pleural effusion. Unchanged changed small left pleural effusions. No pneumothorax. HEART AND MEDIASTINUM: No acute abnormality of the cardiac and mediastinal silhouettes. BONES AND SOFT TISSUES: Left shoulder prosthesis noted. Right upper quadrant surgical clips noted. No acute osseous abnormality. IMPRESSION: 1. New right basilar atelectasis or consolidation with small to moderate right pleural effusion. 2. Unchanged small left pleural effusion. Electronically signed by: Waddell Calk MD 11/01/2024 02:10 PM EST RP Workstation: [id number]     Medications:     Scheduled Medications:  sodium chloride    Intravenous Once   Chlorhexidine  Gluconate Cloth  6 each Topical Daily   hydrocortisone  sod succinate (SOLU-CORTEF ) inj  100 mg Intravenous BID   insulin  aspart  0-15 Units Subcutaneous  Q4H   mouth rinse  15 mL Mouth Rinse Q2H   pantoprazole  (PROTONIX ) IV  40 mg Intravenous Q12H   thiamine  (VITAMIN B1) injection  100 mg Intravenous Q24H    Infusions:  DOBUTamine  2.5 mcg/kg/min (11/03/24 1000)   heparin  500 Units/hr (11/03/24 1000)   micafungin  (MYCAMINE ) 100 mg in sodium chloride  0.9 % 100 mL IVPB 100 mg (11/03/24 1006)   piperacillin -tazobactam (ZOSYN )  IV Stopped (11/03/24 0944)   propofol  (DIPRIVAN ) infusion Stopped (11/02/24 1901)    PRN Medications: acetaminophen , fentaNYL  (SUBLIMAZE ) injection, fentaNYL  (SUBLIMAZE ) injection, sodium chloride   flush   Assessment:   1. Septic shock with e.coli bacteremia +/- component of cardiogenic shock New Cardiomyopathy -  - Patient was admitted with small bowel obstruction and septic shock. Patient underwent exploratory laparotomy exploratory laparotomy was found to have a densities at the end obstructing the proximal small bowel near Roux-en-Y anastomosis.   - Bcx + for e.coli - abx and stress-dose steroids per CCM - ab closure on 11/22 - remains on DBA for cardiac support - Echo showed LVEF of 20-25% with multiple wall motion abnormalities  - POCUS ECHO 11/22 EF ~30-35% with RWMA concerning for underlying CAD - Continue DBA. Can add GDMT soon. Avoid hypotension with LUE ischemia - May need cath prior to d/c if EF not improving (pending improvement of thrombocytopenia and overall condition). Would do cath femorally  2. Acute systolic HF - plan as above.  - continue DBA. Check co-ox  3. Acute hypoxic resp failure - failed vent wean today - CCM managing  4. Non-Obstructive CAD - Coronary CTA in 09/2024 showed coronary calcium  score of 315 (90th percentile for age and sex) and mild mixed nonobstructive CAD with 1-24% stenosis of the proximal RCA and 25-49% stenosis of LAD. - No Aspirin  given severe thrombocytopenia.  - Intolerant to statins in the past.  - Echo concerning for new RWMA. Will need to consider cath  prior to d/c   5. Ischemic Left Hand - VVS following. S/p revasc 11/22   6. Severe thrombocytopenia - PLTS 8k -> 64K - due to sepsis - supportive care   CRITICAL CARE Performed by: Cherrie Sieving  Total critical care time: 38 minutes  Critical care time was exclusive of separately billable procedures and treating other patients.  Critical care was necessary to treat or prevent imminent or life-threatening deterioration.  Critical care was time spent personally by me (independent of midlevel providers or residents) on the following activities: development of treatment plan with patient and/or surrogate as well as nursing, discussions with consultants, evaluation of patient's response to treatment, examination of patient, obtaining history from patient or surrogate, ordering and performing treatments and interventions, ordering and review of laboratory studies, ordering and review of radiographic studies, pulse oximetry and re-evaluation of patient's condition.    Length of Stay: 5   Sieving Cherrie MD 11/03/2024, 10:30 AM  Advanced Heart Failure Team Pager [phone] (M-F; 7a - 4p)  Please contact [facility] for night-coverage after hours (4p -7a ) and weekends on [email or web address]

## 2024-11-03 NOTE — Progress Notes (Signed)
 PHARMACY - TOTAL PARENTERAL NUTRITION CONSULT NOTE   Indication: Small bowel obstruction   Patient Measurements: Height: 5' 4 (162.6 cm) Weight: 80.1 kg (176 lb 9.4 oz) IBW/kg (Calculated) : 54.7 TPN AdjBW (KG): 59.9 Body mass index is 30.31 kg/m.  Assessment: 58 years of age female with history of Roux-en-Y gastric bypass admitted for septic shock and acute abdomen who went emergent on 11/18 for exploratory surgery requiring lysis of adhesions, G-tube placement and wound vac. She continued to be in sepsis and returned to OR for repeat exlap 11/20 but no sources if ischemic bowel found. 11/22 back to OR for exlap and abdominal closure. Day 6 of NPO status inpatient. Pharmacy consulted for TPN. EF from POCUS 11/22 estimated at 35-40% (up from 20% on ECHO 11/20). High refeeding risk due to significant weight loss and inadequate oral intake for several months.    Glucose / Insulin : CBG low 80-90s. Hydrocortisone  100 q8h.  A1c 5.3 Electrolytes: Na low 133, K 3.8, Phos 4.6, Mg 2.2. CoCa ~9.1. Renal: SCr 1.20 (trend up), BUN up 40.  Hepatic: LFTs bumped - trend down. Tbili up 1.3. TG 205. No jaundice. Intake / Output; MIVF: UOP decreased (853 mL last 24 hours, 0.4 mL/kg/hr). Wound vac output 225 mL. G-tube output 170 mL. Net + 14L. GI Imaging: none since start of TPN GI Surgeries / Procedures:  11/18 Exlap, lysis of adhesions, G-tube placement, ABthera VAC placement 11/20 Exlap, left open 11/22 Exlap with abdominal closure  Central access: Triple lumen PICC TPN start date: 11/03/24  Nutritional Goals: Goal concentrated TPN rate is 58 mL/hr (provides 100 g of protein and 1800 kcals per day) Goal dextrose  (18.5%) Goal unconcentrated TPN rate is 75 mL/hr (provides 100 g of protein and 1915 kcals per day)  RD Assessment: Estimated Needs Total Energy Estimated Needs: 1800-2000 kcals Total Protein Estimated Needs: 90-110 grams Total Fluid Estimated Needs: >/= 1.8L  Current Nutrition:   NPO Propofol  off currently   Plan:  Start concentrated TPN at 30 mL/hr at 1800 with reduced dextrose  (15%) due to refeeding risk - this provides ~50% of needs.  Electrolytes in TPN: Na 60mEq/L, K 66mEq/L, reduce Ca 2.5mEq/L, Mg 28mEq/L, and reduce Phos 54mmol/L. Cl:Ac 1.5:1 (entered as max chloride due to bag constituents).  Add standard MVI and trace elements to TPN Initiate Sensitive q4h SSI and adjust as needed  Monitor TPN labs on Mon/Thurs, daily for 3 days at least. On Thiamine  IV outside of TPN.   Harlene Boga, PharmD, BCPS, BCCCP Please refer to [facility] for [facility] Pharmacy numbers 11/03/2024,9:49 AM

## 2024-11-03 NOTE — Progress Notes (Signed)
  Daily Progress Note  S/p: SBO, LOA, E. coli bacteremia, open abdomen, significant pressor requirement, occluded left radial artery, hand ischemia, thrombocytopenia.  1 Day Post-Op Radial artery embolectomy, abdominal closure  Subjective: Intubated sedated  Objective: Vitals:   11/03/24 0745 11/03/24 0800  BP: 108/69 100/72  Pulse: (!) 103 (!) 101  Resp: (!) 21 17  Temp: 99.3 F (37.4 C) 99.1 F (37.3 C)  SpO2: 96% 96%    Physical Examination Intubated, sedated Left thumb with blistering.  Red, indurated Excellent signals throughout the hand and into the thumb.   ASSESSMENT/PLAN:  Patient is a 58 year old female currently with open abdomen status post SBO, LOA, E. coli bacteremia which required significant pressor.    1 Day Post-Op Radial artery embolectomy, abdominal closure  With some signals in the left wrist, left hand, left thumb.  If blistering pops, recommend Xeroform dressing, Kerlix. I discussed the case yesterday with hand surgery, [name], who will see the patient in the coming days  [name] MD [facility] [phone] 11/03/2024  9:44 AM

## 2024-11-03 NOTE — Progress Notes (Addendum)
 [facility] Progress Note  1 Day Post-Op  Subjective: CC:  Alert, FC on vent  AFVSS, off of pressors   Objective: Vital signs in last 24 hours: Temp:  [95.9 F (35.5 C)-99.3 F (37.4 C)] 97.9 F (36.6 C) (11/23 1100) Pulse Rate:  [83-106] 92 (11/23 1100) Resp:  [5-35] 16 (11/23 1100) BP: (100-121)/(66-84) 109/68 (11/23 1100) SpO2:  [94 %-100 %] 97 % (11/23 1100) FiO2 (%):  [40 %] 40 % (11/23 0745) Weight:  [80.1 kg] 80.1 kg (11/23 0500) Last BM Date :  (PTA)  Intake/Output from previous day: 11/22 0701 - 11/23 0700 In: 2478.1 [I.V.:635; Blood:930; IV Piggyback:913.1] Out: 1268 [Urine:853; Drains:395; Blood:20] Intake/Output this shift: Total I/O In: 749.1 [I.V.:31.8; IV Piggyback:717.3] Out: 260 [Urine:235; Drains:25]  PE: Gen:  Alert, ill appearing Card:  Regular rate and rhythm, no lower extremity edema  Pulm:  ventilated respirations Abd: Soft, VAC in place holding suction, minimally bloody drainage in cannister.  Skin: warm and dry, no rashes  Psych: A&Ox3   Lab Results:  Recent Labs    11/02/24 1631 11/03/24 0353  WBC 12.9* 11.1*  HGB 9.8* 9.2*  HCT 29.5* 27.5*  PLT 84* 64*   BMET Recent Labs    11/02/24 1631 11/03/24 0353  NA 132* 133*  K 3.7 3.8  CL 90* 91*  CO2 25 29  GLUCOSE 76 86  BUN 35* 40*  CREATININE 1.07* 1.20*  CALCIUM  7.2* 7.2*   PT/INR Recent Labs    11/02/24 0147 11/02/24 0853  LABPROT 13.3 12.2  INR 1.0 0.9   CMP     Component Value Date/Time   NA 133 (L) 11/03/2024 0353   NA 142 09/23/2024 1030   K 3.8 11/03/2024 0353   CL 91 (L) 11/03/2024 0353   CO2 29 11/03/2024 0353   GLUCOSE 86 11/03/2024 0353   BUN 40 (H) 11/03/2024 0353   BUN 17 09/23/2024 1030   CREATININE 1.20 (H) 11/03/2024 0353   CREATININE 0.82 06/18/2013 1009   CALCIUM  7.2 (L) 11/03/2024 0353   PROT 4.2 (L) 11/03/2024 0353   PROT 6.5 06/28/2024 0842   ALBUMIN  1.6 (L) 11/03/2024 0353   ALBUMIN  4.5 06/28/2024 0842   AST 80 (H)  11/03/2024 0353   ALT 66 (H) 11/03/2024 0353   ALKPHOS 54 11/03/2024 0353   BILITOT 1.3 (H) 11/03/2024 0353   BILITOT 0.3 06/28/2024 0842   GFRNONAA 53 (L) 11/03/2024 0353   GFRNONAA 87 06/18/2013 1009   GFRAA >60 08/04/2020 1038   GFRAA >89 06/18/2013 1009   Lipase     Component Value Date/Time   LIPASE 22 11/01/2024 0450       Studies/Results: DG Chest Port 1 View Result Date: 11/02/2024 EXAM: 1 VIEW(S) XRAY OF THE CHEST 11/02/2024 01:48:08 AM COMPARISON: Earlier today. CLINICAL HISTORY: Acute respiratory failure (HCC). FINDINGS: LINES, TUBES AND DEVICES: The endotracheal tube is 6.4 cm above the carina. Right PICC line tip in the SVC. Right internal jugular line tip in the SVC. LUNGS AND PLEURA: Bilateral pleural effusions with bibasilar airspace opacities, right greater than left, unchanged since prior study. No pneumothorax. HEART AND MEDIASTINUM: No acute abnormality of the cardiac and mediastinal silhouettes. BONES AND SOFT TISSUES: No acute osseous abnormality. IMPRESSION: 1. Bilateral pleural effusions with bibasilar airspace opacities, right greater than left, unchanged since prior study. Electronically signed by: Franky Crease MD 11/02/2024 02:05 AM EST RP Workstation: [id number]   VAS US  UPPER EXTREMITY ARTERIAL DUPLEX Result Date: 11/01/2024  UPPER EXTREMITY DUPLEX STUDY  Patient Name:  Morgan [name] [facility]  Date of Exam:   11/01/2024 Medical Rec #: [id number]         Accession #:    [id number] Date of Birth: 1965/12/24         Patient Gender: F Patient Age:   58 years Exam Location:  [facility] Procedure:      VAS US  UPPER EXTREMITY ARTERIAL DUPLEX Referring Phys: PAULA SIMPSON --------------------------------------------------------------------------------  Indications: Bruising and No radial artery doppler pulse.  Performing Technologist: Ricka Sturdivant-Jones RDMS, RVT  Examination Guidelines: A complete evaluation includes B-mode imaging, spectral Doppler, color Doppler,  and power Doppler as needed of all accessible portions of each vessel. Bilateral testing is considered an integral part of a complete examination. Limited examinations for reoccurring indications may be performed as noted.  Left Doppler Findings: +-------------+----------+----------+--------+----------------+ Site         PSV (cm/s)Waveform  StenosisComments         +-------------+----------+----------+--------+----------------+ Brachial Dist31        triphasic                          +-------------+----------+----------+--------+----------------+ Radial Prox  24        triphasic                          +-------------+----------+----------+--------+----------------+ Radial Mid   12        monophasic                         +-------------+----------+----------+--------+----------------+ Radial Dist  0         occluded          appears occluded +-------------+----------+----------+--------+----------------+ Ulnar Prox   34        triphasic                          +-------------+----------+----------+--------+----------------+ Ulnar Mid    22        triphasic                          +-------------+----------+----------+--------+----------------+ Ulnar Dist   19        triphasic                          +-------------+----------+----------+--------+----------------+   Summary:  Left: Left radial artery distal segment appears occluded. Doppler       flow absent. *See table(s) above for measurements and observations. Electronically signed by Fonda Rim on 11/01/2024 at 7:24:58 PM.    Final    VAS US  UPPER EXTREMITY VENOUS DUPLEX Result Date: 11/01/2024 UPPER VENOUS STUDY  Patient Name:  Morgan [name] [facility]  Date of Exam:   11/01/2024 Medical Rec #: [id number]         Accession #:    [id number] Date of Birth: [birth year]         Patient Gender: F Patient Age:   58 years Exam Location:  [facility] Procedure:      VAS US  UPPER EXTREMITY VENOUS DUPLEX Referring  Phys: VINA SIMPSON --------------------------------------------------------------------------------  Indications: ICU patient - fever, septic shock Other Indications: Underwent exploratory laparotomy 10/31/24. Limitations: Line and bandages. Performing Technologist: Ricka Sturdivant-Jones RDMS, RVT  Examination Guidelines: A complete evaluation includes B-mode imaging, spectral Doppler, color Doppler, and power Doppler as needed of all accessible portions  of each vessel. Bilateral testing is considered an integral part of a complete examination. Limited examinations for reoccurring indications may be performed as noted.  Right Findings: +----------+------------+---------+-----------+----------+--------------+ RIGHT     CompressiblePhasicitySpontaneousProperties   Summary     +----------+------------+---------+-----------+----------+--------------+ IJV                                                 Not visualized +----------+------------+---------+-----------+----------+--------------+ Subclavian    Full       Yes       Yes                             +----------+------------+---------+-----------+----------+--------------+ Axillary      Full       Yes       Yes                             +----------+------------+---------+-----------+----------+--------------+ Brachial      Full                                                 +----------+------------+---------+-----------+----------+--------------+ Radial        Full                                                 +----------+------------+---------+-----------+----------+--------------+ Ulnar         Full                                                 +----------+------------+---------+-----------+----------+--------------+ Cephalic    Partial                                    Chronic     +----------+------------+---------+-----------+----------+--------------+ Basilic       Full                                                  +----------+------------+---------+-----------+----------+--------------+ IJV not visualized due to tape.  Left Findings: +----------+------------+---------+-----------+----------+-------+ LEFT      CompressiblePhasicitySpontaneousPropertiesSummary +----------+------------+---------+-----------+----------+-------+ IJV           Full       Yes       Yes                      +----------+------------+---------+-----------+----------+-------+ Subclavian    Full       Yes       Yes                      +----------+------------+---------+-----------+----------+-------+ Axillary      Full       Yes       Yes                      +----------+------------+---------+-----------+----------+-------+  Brachial      Full                                          +----------+------------+---------+-----------+----------+-------+ Radial        Full                                          +----------+------------+---------+-----------+----------+-------+ Ulnar         Full                                          +----------+------------+---------+-----------+----------+-------+ Cephalic    Partial                                 Chronic +----------+------------+---------+-----------+----------+-------+ Basilic       Full                                          +----------+------------+---------+-----------+----------+-------+  Summary:  Right: No evidence of deep vein thrombosis in the upper extremity. Findings consistent with chronic superficial vein thrombosis involving the right cephalic vein.  Left: No evidence of deep vein thrombosis in the upper extremity. Findings consistent with chronic superficial vein thrombosis involving the left cephalic vein.  *See table(s) above for measurements and observations.  Diagnosing physician: Fonda Rim Electronically signed by Fonda Rim on 11/01/2024 at 7:24:42 PM.    Final    VAS US  LOWER  EXTREMITY VENOUS (DVT) Result Date: 11/01/2024  Lower Venous DVT Study Patient Name:  OTHELIA RIEDERER [facility]  Date of Exam:   11/01/2024 Medical Rec #: [id number]         Accession #:    [id number] Date of Birth: [birth year]         Patient Gender: F Patient Age:   72 years Exam Location:  [facility] Procedure:      VAS US  LOWER EXTREMITY VENOUS (DVT) Referring Phys: VINA SIMPSON --------------------------------------------------------------------------------  Indications: Fever, septic shock, and Edema.  Comparison Study: 05/24/19 - negative Performing Technologist: Ricka Sturdivant-Jones RDMS, RVT  Examination Guidelines: A complete evaluation includes B-mode imaging, spectral Doppler, color Doppler, and power Doppler as needed of all accessible portions of each vessel. Bilateral testing is considered an integral part of a complete examination. Limited examinations for reoccurring indications may be performed as noted. The reflux portion of the exam is performed with the patient in reverse Trendelenburg.  +---------+---------------+---------+-----------+----------+--------------+ RIGHT    CompressibilityPhasicitySpontaneityPropertiesThrombus Aging +---------+---------------+---------+-----------+----------+--------------+ CFV      Full           Yes      Yes                                 +---------+---------------+---------+-----------+----------+--------------+ SFJ      Full                                                        +---------+---------------+---------+-----------+----------+--------------+  FV Prox  Full                                                        +---------+---------------+---------+-----------+----------+--------------+ FV Mid   Full           Yes      Yes                                 +---------+---------------+---------+-----------+----------+--------------+ FV DistalFull                                                         +---------+---------------+---------+-----------+----------+--------------+ PFV      Full                                                        +---------+---------------+---------+-----------+----------+--------------+ POP      Full           Yes      Yes                                 +---------+---------------+---------+-----------+----------+--------------+ PTV      Partial                                      Chronic        +---------+---------------+---------+-----------+----------+--------------+ PERO     Partial                                      Chronic        +---------+---------------+---------+-----------+----------+--------------+    Summary: RIGHT: - There is no evidence of deep vein thrombosis in the lower extremity.  - No cystic structure found in the popliteal fossa.  LEFT: - Findings consistent with chronic deep vein thrombosis involving the left posterior tibial veins, and left peroneal veins.  - No cystic structure found in the popliteal fossa.  *See table(s) above for measurements and observations. Electronically signed by Fonda Rim on 11/01/2024 at 7:24:25 PM.    Final     Anti-infectives: Anti-infectives (From admission, onward)    Start     Dose/Rate Route Frequency Ordered Stop   10/31/24 1400  piperacillin -tazobactam (ZOSYN ) IVPB 3.375 g        3.375 g 12.5 mL/hr over 240 Minutes Intravenous Every 8 hours 10/31/24 0751 11/07/24 1359   10/31/24 1100  micafungin  (MYCAMINE ) 100 mg in sodium chloride  0.9 % 100 mL IVPB        100 mg 105 mL/hr over 1 Hours Intravenous Every 24 hours 10/31/24 0901 12/05/24 2359   10/30/24 0600  piperacillin -tazobactam (ZOSYN ) IVPB 3.375 g  Status:  Discontinued        3.375 g 12.5 mL/hr over 240 Minutes  Intravenous Every 8 hours 10/29/24 2350 10/31/24 0751   10/29/24 2145  piperacillin -tazobactam (ZOSYN ) IVPB 3.375 g        3.375 g 100 mL/hr over 30 Minutes Intravenous  Once 10/29/24 2137 10/29/24 2205    10/29/24 1700  cefTRIAXone  (ROCEPHIN ) 1 g in sodium chloride  0.9 % 100 mL IVPB  Status:  Discontinued        1 g 200 mL/hr over 30 Minutes Intravenous  Once 10/29/24 1646 10/29/24 1656   10/29/24 1700  metroNIDAZOLE  (FLAGYL ) IVPB 500 mg        500 mg 100 mL/hr over 60 Minutes Intravenous  Once 10/29/24 1646 10/29/24 1809   10/29/24 1700  cefTRIAXone  (ROCEPHIN ) 2 g in sodium chloride  0.9 % 100 mL IVPB        2 g 200 mL/hr over 30 Minutes Intravenous  Once 10/29/24 1656 10/29/24 1733        Assessment/Plan  POD 5, s/p ex lap with LOA, g-tube placement in gastric remnant Dr. Vernetta 11/18 secondary to SBO with possible perforation POD 3, s/p ex lap with washout, Dr. Tanda 11/20 POD 1, s/p washout, abdominal closure Dr. Tanda 11/23 -cont zosyn , BC 2/4 positive for E coli.  Antifungal coverage added 11/20, on stress dose steroids -return to OR hopefully tomorrow for abdominal closure -IV thiamine  for 3 days, completed -cont foley for urinary monitoring -may be able to start using g-tube soon for TFs -plts at 10K this am.  1 pack of plts ordered.  Continue to monitor   FEN - NPO/IVFs per CCM, TNA starting today, start trickle TF today VTE - lovenox  on hold due to thrombocytopenia ID - zosyn , micafungin    E coli bacteremia - continue zosyn  Septic shock - secondary to all of the above, pressors are weaning CAD/Heart failure - EF 20-25% on echo yesterday.  D/w CCM.  May get cards to see patient.  Recent CT coronary with significant disease.  Coox pending AKI - resolved DM - SSI Leukopenia - resolved, secondary to sepsis Pancreatitis - lipase 22 today, suspect this is reactive Thrombocytopenia - plts 10K, give 1 pack, follow labs Hypocalcemia - per CCM PCM - picc/TNA on hold as we may start using her g-tube soon Neck wound - unclear etiology, have asked nursing to place mepilex over this to protect it.  Avoid dressings or lines in this area. L hand ischemic changes -  VVS following,  s/p embolectomy radial artery and repair, likely will need more surgery.     LOS: 5 days   I reviewed nursing notes, Consultant VVS notes, hospitalist notes, last 24 h vitals and pain scores, last 48 h intake and output, last 24 h labs and trends, and last 24 h imaging results.  This care required moderate level of medical decision making.   Almarie Pringle, PA-C [facility] Please see Amion for pager number during day hours 7:00am-4:30pm

## 2024-11-03 NOTE — Progress Notes (Signed)
 Nutrition Follow-up  DOCUMENTATION CODES:   Non-severe (moderate) malnutrition in context of chronic illness  INTERVENTION:  - Starting trickle tube feeds: Vital High Protein @ 68mL/hr  - Plan to start TPN tonight @ 68mL/hr.              - TPN management per pharmacy.   - Monitor magnesium , potassium, and phosphorus BID for at least 3 days, MD to replete as needed, as pt is at risk for refeeding syndrome given malnutrition with significant weight loss. - Continue 100mg  thiamine  daily for at least another x7 days.   - Vitamin/mineral labs being checked by [facility]:             - Vitamin B12 and Thiamine  WNL - Copper , Zinc , Vitamin A  - LOW, recommend starting supplementation once patient able to get enteral medications    - Daily weights.   NUTRITION DIAGNOSIS:   Moderate Malnutrition related to chronic illness as evidenced by moderate fat depletion, moderate muscle depletion, percent weight loss (25% in 10 months). *ongoing  GOAL:   Patient will meet greater than or equal to 90% of their needs *progressing, starting TPN and TF  MONITOR:   Vent status, Labs, Weight trends  REASON FOR ASSESSMENT:   Consult Enteral/tube feeding initiation and management, New TPN/TNA  ASSESSMENT:   58 y.o. female with PMH of Roux-en-Y gastric bypass (2018) who presented with abdominal pain, lactic acidosis and lipase greater than 2800. Admitted for SBO.  11/18 Admit; s/p ex-lap, lysis of adhesion, placement of G-tube, placement of wound VAC; returned to ICU intubated 11/20 s/p reexploration of abdomen and closure of mesentery defect, placement of wound VAC 11/21 TPN was supposed to be started but cancelled by [facility] due to plan to try and start TF via G-tube eventually 11/22 Transferred to [facility]; s/p re-exploration of abdomen, abdominal closure  Patient remains intubated on ventilator support MV: 8.3 L/min Temp (24hrs), Avg:98.3 F (36.8 C), Min:97 F (36.1 C), Max:99.3 F (37.4 C)  Patient  returned to OR yesterday and abdomen closed. Patient now off pressors.  Plan to start TPN and TF today. Discussed with CCM, Surgery, Pharmacist, and RN. Expressed concern for refeeding syndrome as patient has now gone 5 days since admission with no nutrition with reported inadequate intake for several months and significant weight loss.  Per pharmacy, patient's TPN is already a reduced dose of Dextrose  % to help w/ concern for high risk refeeding (108g Dextrose  total in TPN tonight) at rate of 31mL/hr.  CCM and GI okay with plan to start Vital HP @ 63mL/hr only for now, not advancing at this time.   Patient has been on thiamine  since 11/19. Dicussed with pharmacy about extending another 7 days.  Will add orders to check Mag and Phos BID for at least 3 days.     Medications reviewed and include: Protonix , 100mg  thiamine  Fentanyl    Labs reviewed:  Na 133 Creatinine 1.20 Triglycerides 205 (as of 11/23)   Vitamin B12 2693 (H) Thiamine  198.9 (WNL) Zinc : 15 (LOW) Copper : 67 (LOW) Vitamin A : 9.9 (LOW)    Diet Order:   Diet Order             Diet NPO time specified  Diet effective now                   EDUCATION NEEDS:  Not appropriate for education at this time  Skin:  Skin Assessment: Skin Integrity Issues: Skin Integrity Issues:: Incisions Incisions: Surgical on abdomen  Last BM:  PTA  Height:  Ht Readings from Last 1 Encounters:  10/29/24 5' 4 (1.626 m)   Weight:  Wt Readings from Last 1 Encounters:  11/03/24 80.1 kg   Ideal Body Weight:  54.55 kg  BMI:  Body mass index is 30.31 kg/m.  Estimated Nutritional Needs:  Kcal:  1800-2000 kcals Protein:  90-110 grams Fluid:  >/= 1.8L    Trude Ned RD, LDN Contact via Secure Chat.

## 2024-11-03 NOTE — Plan of Care (Signed)
  Problem: Pain Managment: Goal: General experience of comfort will improve and/or be controlled Outcome: Progressing   Problem: Safety: Goal: Ability to remain free from injury will improve Outcome: Progressing   Problem: Respiratory: Goal: Ability to maintain a clear airway and adequate ventilation will improve Outcome: Progressing

## 2024-11-03 NOTE — Progress Notes (Signed)
 NAME:  Morgan [name], MRN:  [id number], DOB:  [birth year], LOS: 5 ADMISSION DATE:  10/29/2024, CONSULTATION DATE:  10/30/2024 REFERRING MD:  Dr. Vernetta CHIEF COMPLAINT:  Small bowel Obstruction   Brief HPI  Briefly, 58 year old female with history of Roux-en-Y gastric bypass who presented to the emergency department yesterday evening with abdominal pain, lactic acidosis and lipase greater than 2800.  She had CCS evaluation and went for exploratory laparotomy.  Found to have dense adhesive band that was obstructing her proximal small bowel as well as biliary leak without evidence of perforation.  She had a gastrostomy tube placed in the left open with a wound VAC.  She is in continuity.  Also in septic shock, she returned from OR to ICU on Levophed , vasopressin , intubated on bicarb drip.  Pertinent  Medical History   Past Medical History:  Diagnosis Date   Allergic rhinitis    Anemia     after gastric bypass in 2018   Anxiety    Barrett's esophagus    Bipolar affective disorder (HCC)    CAP (community acquired pneumonia) 07/20/2018   Chronic respiratory failure (HCC)    Constipation, chronic    Degenerative joint disease of spine    Depression    Diabetes mellitus without complication (HCC)    type 2    DM (diabetes mellitus) (HCC)    hypoglycemic per pt - no longer takes DM meds due to weight loss from gastric bypass in 2018   Dry eye    Family history of adverse reaction to anesthesia    nausea and vomiting   Gastroparesis    GERD (gastroesophageal reflux disease)    H/O shoulder replacement 08/11/2020   left shoulder   History of bariatric surgery 03/2017   History of hiatal hernia    HLD (hyperlipidemia) 03/12/2013   Hyperlipidemia    Hypertension    No HTN meds since weight loss from Gastric Bypass in 2018   Intractable chronic migraine without aura 06/04/2015   Migraine headache    Morbid obesity (HCC)    OSA (obstructive sleep apnea)    No cpap since gastric  surgery   Osteoarthritis    bilateral knee   SBO (small bowel obstruction) (HCC) 03/19/2019   Sleep apnea    Unspecified hypothyroidism    Vitamin B 12 deficiency    Vitamin D  deficiency      Significant Hospital Events: Including procedures, antibiotic start and stop dates in addition to other pertinent events   11/18 admit 11/20 back to OR for look, weaning pressors  11/22 Trx to 2H due to left hand ischemia. Had radial artery embolectomy and abdominal washout & closure.   Interim History / Subjective:   Sedation off since yesterday afternoon after OR. Overnight no acute events.  Objective    Blood pressure 100/72, pulse (!) 101, temperature 99.1 F (37.3 C), resp. rate 17, height 5' 4 (1.626 m), weight 80.1 kg, last menstrual period 10/17/2017, SpO2 96%. CVP:  [0 mmHg-9 mmHg] 7 mmHg  Vent Mode: CPAP;PSV FiO2 (%):  [40 %] 40 % Set Rate:  [15 bmp] 15 bmp Vt Set:  [480 mL] 480 mL PEEP:  [5 cmH20] 5 cmH20 Pressure Support:  [8 cmH20] 8 cmH20 Plateau Pressure:  [14 cmH20-18 cmH20] 14 cmH20   Intake/Output Summary (Last 24 hours) at 11/03/2024 0901 Last data filed at 11/03/2024 0800 Gross per 24 hour  Intake 2029.68 ml  Output 1343 ml  Net 686.68 ml   [employer]  11/01/24 0500 11/02/24 0030 11/03/24 0500  Weight: 78.7 kg 77.8 kg 80.1 kg    Examination: General: critically ill appearing woman lying in bed in NAD HEENT: [place]/AT, eyes anicteric, ETT in place CV:  S1S2, RRR pulm: breathing comfortably on MV; tachypnea on 8/5 Abs: soft, minimally TTP. Gastric drain with bilious output.  Extremities: left hand with discolored thumb- now with blister. Rest of hand has eccymotic/ thrombosed appearance, but no mottling of uninvolved areas. PICC RUE. Skin: diaphoretic Neuro:  awake, nodding to answer questions, globally weak but able to move all extremities somewhat-- mostly moving arms with shoulder shrug. GU: foley with amber urine  Na+  133 K+ 3.8 BUN 40 Cr 1.2 AST  80 ALT 66 T bili 1.3 WBC 11.1 H/H 9.2/27.5 Platelets 64   Resolved problem list   Leukopenia Metabolic acidosis Hypokalemia Hyperphosphatemia  Assessment and Plan   Septic shock due to perforated SBO with peritonitis, E Coli bacteremia SBO due to adhesion, s/p ex lap with LOA on 11/18; has had washouts on 11/20 and abdominal closure on 11/22. Has G-tube in gastric remnant History of Roux-en-Y bariatric surgery -post-op care per surgery -con't gastric drain to suction' -Defer timing of initiation of TF to surgery; favor starting TPN with low albumin , h/o bariatric surgery, and likely several days until she is able to get full nutritional requirements -needs 7 days of coverage for E coli bacteremia, needs 5 total of micafungin  for peritonitis -con't trying to avoid peripheral vasoconstrictors with hand ischemia -con't stress dose steroids  Acute HFrEF- concerning given CAD hx vs septic cardiomyopathy  -con't DBA; have been trying to avoid all vasopressors  -serial coox, CVP  Critical left hand ischemia due to thrombosed radial artery, s/p thrombectomy-previous left radial aline, d/c'd 11/20 after non-functioning with progressive ischemia and absent radial signals  -appreciate vascular surgery's management -fixed dose heparin  -need to be careful with diuresis to prevent poor hand perfusion  Thrombocytopenia due to sepsis, improved today -no need for platelet transfusion; monitor  Acute blood loss anemia -transfuse for Hb <7 or hemodynamically significant bleeding  Acute hypoxic respiratory failure postoperatively RLL PNA- suspect aspiration from SBO -advance ETT 4 cm -LTVV -VAP prevention protocol -PAD protocol for sedation -daily SAT & SBT; mentation improved enough for extubation, but failed on 8/5 due to tachypnea, WOB   Hypoglycemia  H/o DM2 but  A1c 5.3 and not on meds PTA-- resolved after Roux-en-Y? -monitor; not needing insulin    Hypothyroidism -needs IV  repletion if not able to take PO soon  Acute kidney injury -500cc LR bolus -strict I/O -renally dose meds, avoid nephrotoxic meds  Hyponatremia -avoid hypotonic fluids  Elevated transaminases, hyperbilirubinemia -trend; may need biliary imaging if continues to trend up    Labs   CBC: Recent Labs  Lab 10/29/24 1541 10/29/24 2131 10/31/24 0736 10/31/24 0931 11/01/24 0450 11/01/24 0807 11/01/24 1544 11/02/24 0136 11/02/24 0147 11/02/24 0247 11/02/24 0853 11/02/24 1631 11/03/24 0353  WBC 1.1*   < > 11.6*   < > 14.3*  --  9.6  --   --   --  13.7* 12.9* 11.1*  NEUTROABS 1.0*  --  10.5*  --   --   --   --   --   --   --   --   --   --   HGB 13.0   < > 12.4   < > 11.0*   < > 10.0* 10.2*  --  10.5* 9.8* 9.8* 9.2*  HCT 42.4   < >  38.0   < > 32.6*   < > 30.2* 30.0*  --  31.0* 29.2* 29.5* 27.5*  MCV 94.4   < > 88.8   < > 85.8  --  88.0  --   --   --  85.6 86.0 85.1  PLT 362   < > 45*   < > 10*  --  15*  --  8*  --  44* 84* 64*   < > = values in this interval not displayed.    Basic Metabolic Panel: Recent Labs  Lab 10/30/24 0921 10/31/24 0446 10/31/24 0614 10/31/24 0931 10/31/24 1837 11/01/24 0450 11/01/24 0807 11/02/24 0136 11/02/24 0247 11/02/24 0355 11/02/24 1631 11/03/24 0353  NA  --  133*  --    < > 128* 126*   < > 131* 132* 129* 132* 133*  K  --  2.9*  --    < > 3.5 3.9   < > 3.4* 3.5 3.6 3.7 3.8  CL  --  96*  --   --  91* 90*  --   --   --  88* 90* 91*  CO2  --  27  --   --  30 28  --   --   --  30 25 29   GLUCOSE  --  136*  --   --  65* 83  --   --   --  95 76 86  BUN  --  26*  --   --  25* 28*  --   --   --  34* 35* 40*  CREATININE  --  1.12*  --   --  0.99 1.02*  --   --   --  1.13* 1.07* 1.20*  CALCIUM   --  6.3*  --   --  7.0* 7.1*  --   --   --  6.9* 7.2* 7.2*  MG 1.5* 2.0  --   --   --  2.1  --   --   --  2.1  --  2.2  PHOS  --   --  3.6  --   --  4.8*  --   --   --  5.8*  --  4.6   < > = values in this interval not displayed.   GFR: Estimated  Creatinine Clearance: 53 mL/min (A) (by C-G formula based on SCr of 1.2 mg/dL (H)). Recent Labs  Lab 10/31/24 1240 10/31/24 1407 11/01/24 0450 11/01/24 1544 11/02/24 0355 11/02/24 0853 11/02/24 1631 11/03/24 0353  WBC  --    < > 14.3* 9.6  --  13.7* 12.9* 11.1*  LATICACIDVEN 3.3*  --  1.7 1.4 1.1  --   --   --    < > = values in this interval not displayed.    Liver Function Tests: Recent Labs  Lab 10/29/24 1541 10/30/24 1141 11/01/24 0450 11/01/24 0920 11/02/24 0355 11/03/24 0353  AST 383* 123* 124*  --  78* 80*  ALT 272* 130* 103*  --  72* 66*  ALKPHOS 127* 48 62  --  44 54  BILITOT 0.3 <0.2 0.7 0.7 0.8 1.3*  PROT 5.6* 3.4* 4.3*  --  3.8* 4.2*  ALBUMIN  3.4* 2.2* 2.1*  --  <1.5* 1.6*   Recent Labs  Lab 10/29/24 1541 10/30/24 0220 10/31/24 0446 11/01/24 0450  LIPASE >2,800* 882* 78* 22    CC:     This patient is critically ill with multiple organ system failure which requires frequent  high complexity decision making, assessment, support, evaluation, and titration of therapies. This was completed through the application of advanced monitoring technologies and extensive interpretation of multiple databases. During this encounter critical care time was devoted to patient care services described in this note for 42 minutes.  Leita SHAUNNA Gaskins, DO 11/03/24 9:31 AM [facility]  For contact information, see Amion. If no response to pager, please call PCCM consult pager. After hours, 7PM- 7AM, please call Elink.

## 2024-11-04 ENCOUNTER — Telehealth (HOSPITAL_COMMUNITY): Payer: Self-pay | Admitting: Pharmacy Technician

## 2024-11-04 ENCOUNTER — Encounter (HOSPITAL_COMMUNITY): Payer: Self-pay | Admitting: General Surgery

## 2024-11-04 ENCOUNTER — Other Ambulatory Visit (HOSPITAL_COMMUNITY): Payer: Self-pay

## 2024-11-04 DIAGNOSIS — R579 Shock, unspecified: Secondary | ICD-10-CM

## 2024-11-04 DIAGNOSIS — R578 Other shock: Secondary | ICD-10-CM | POA: Diagnosis not present

## 2024-11-04 DIAGNOSIS — R6521 Severe sepsis with septic shock: Secondary | ICD-10-CM | POA: Diagnosis not present

## 2024-11-04 DIAGNOSIS — A419 Sepsis, unspecified organism: Secondary | ICD-10-CM | POA: Diagnosis not present

## 2024-11-04 DIAGNOSIS — K56609 Unspecified intestinal obstruction, unspecified as to partial versus complete obstruction: Secondary | ICD-10-CM | POA: Diagnosis not present

## 2024-11-04 DIAGNOSIS — F32A Depression, unspecified: Secondary | ICD-10-CM

## 2024-11-04 DIAGNOSIS — I5020 Unspecified systolic (congestive) heart failure: Secondary | ICD-10-CM | POA: Diagnosis not present

## 2024-11-04 DIAGNOSIS — F419 Anxiety disorder, unspecified: Secondary | ICD-10-CM

## 2024-11-04 LAB — PHOSPHORUS
Phosphorus: 2.6 mg/dL (ref 2.5–4.6)
Phosphorus: 2.8 mg/dL (ref 2.5–4.6)

## 2024-11-04 LAB — CBC
HCT: 22.3 % — ABNORMAL LOW (ref 36.0–46.0)
Hemoglobin: 7.3 g/dL — ABNORMAL LOW (ref 12.0–15.0)
MCH: 28.7 pg (ref 26.0–34.0)
MCHC: 32.7 g/dL (ref 30.0–36.0)
MCV: 87.8 fL (ref 80.0–100.0)
Platelets: 87 K/uL — ABNORMAL LOW (ref 150–400)
RBC: 2.54 MIL/uL — ABNORMAL LOW (ref 3.87–5.11)
RDW: 14.9 % (ref 11.5–15.5)
WBC: 10.4 K/uL (ref 4.0–10.5)
nRBC: 0 % (ref 0.0–0.2)

## 2024-11-04 LAB — GLUCOSE, CAPILLARY
Glucose-Capillary: 31 mg/dL — CL (ref 70–99)
Glucose-Capillary: 201 mg/dL — ABNORMAL HIGH (ref 70–99)
Glucose-Capillary: 182 mg/dL — ABNORMAL HIGH (ref 70–99)
Glucose-Capillary: 230 mg/dL — ABNORMAL HIGH (ref 70–99)
Glucose-Capillary: 257 mg/dL — ABNORMAL HIGH (ref 70–99)
Glucose-Capillary: 238 mg/dL — ABNORMAL HIGH (ref 70–99)
Glucose-Capillary: 182 mg/dL — ABNORMAL HIGH (ref 70–99)

## 2024-11-04 LAB — BASIC METABOLIC PANEL WITH GFR
Anion gap: 10 (ref 5–15)
BUN: 27 mg/dL — ABNORMAL HIGH (ref 6–20)
CO2: 35 mmol/L — ABNORMAL HIGH (ref 22–32)
Calcium: 7.1 mg/dL — ABNORMAL LOW (ref 8.9–10.3)
Chloride: 90 mmol/L — ABNORMAL LOW (ref 98–111)
Creatinine, Ser: 0.6 mg/dL (ref 0.44–1.00)
GFR, Estimated: >60 mL/min (ref >60)
Glucose, Bld: 524 mg/dL (ref 70–99)
Potassium: 4.3 mmol/L (ref 3.5–5.1)
Sodium: 135 mmol/L (ref 135–145)

## 2024-11-04 LAB — TRIGLYCERIDES: Triglycerides: 157 mg/dL — ABNORMAL HIGH (ref <150)

## 2024-11-04 LAB — PREPARE PLATELET PHERESIS: Unit division: 0

## 2024-11-04 LAB — COMPREHENSIVE METABOLIC PANEL WITH GFR
ALT: 64 U/L — ABNORMAL HIGH (ref 0–44)
AST: 88 U/L — ABNORMAL HIGH (ref 15–41)
Albumin: <1.5 g/dL — ABNORMAL LOW (ref 3.5–5.0)
Alkaline Phosphatase: 48 U/L (ref 38–126)
Anion gap: 8 (ref 5–15)
BUN: 31 mg/dL — ABNORMAL HIGH (ref 6–20)
CO2: 34 mmol/L — ABNORMAL HIGH (ref 22–32)
Calcium: 7.2 mg/dL — ABNORMAL LOW (ref 8.9–10.3)
Chloride: 94 mmol/L — ABNORMAL LOW (ref 98–111)
Creatinine, Ser: 0.74 mg/dL (ref 0.44–1.00)
GFR, Estimated: >60 mL/min (ref >60)
Glucose, Bld: 188 mg/dL — ABNORMAL HIGH (ref 70–99)
Potassium: 2.9 mmol/L — ABNORMAL LOW (ref 3.5–5.1)
Sodium: 136 mmol/L (ref 135–145)
Total Bilirubin: 0.8 mg/dL (ref 0.0–1.2)
Total Protein: 4.1 g/dL — ABNORMAL LOW (ref 6.5–8.1)

## 2024-11-04 LAB — MAGNESIUM
Magnesium: 1.7 mg/dL (ref 1.7–2.4)
Magnesium: 2.3 mg/dL (ref 1.7–2.4)

## 2024-11-04 LAB — HEPARIN LEVEL (UNFRACTIONATED)
Heparin Unfractionated: <0.1 [IU]/mL — ABNORMAL LOW (ref 0.30–0.70)
Heparin Unfractionated: <0.1 [IU]/mL — ABNORMAL LOW (ref 0.30–0.70)

## 2024-11-04 LAB — BPAM PLATELET PHERESIS
Blood Product Expiration Date: 202511232359
ISSUE DATE / TIME: 202511210959
Unit Type and Rh: 6200

## 2024-11-04 LAB — COOXEMETRY PANEL
Carboxyhemoglobin: 2.1 % — ABNORMAL HIGH (ref 0.5–1.5)
Methemoglobin: <0.7 % (ref 0.0–1.5)
O2 Saturation: 76 %
Total hemoglobin: 7.5 g/dL — ABNORMAL LOW (ref 12.0–16.0)

## 2024-11-04 MED ORDER — MAGNESIUM SULFATE 4 GM/100ML IV SOLN
4.0000 g | Freq: Once | INTRAVENOUS | Status: AC
  Administered 2024-11-04: 4 g via INTRAVENOUS
  Filled 2024-11-04: qty 100

## 2024-11-04 MED ORDER — FUROSEMIDE 10 MG/ML IJ SOLN
40.0000 mg | Freq: Once | INTRAMUSCULAR | Status: AC
  Administered 2024-11-04: 40 mg via INTRAVENOUS
  Filled 2024-11-04: qty 4

## 2024-11-04 MED ORDER — ACETAMINOPHEN 325 MG PO TABS
650.0000 mg | ORAL_TABLET | Freq: Four times a day (QID) | ORAL | Status: DC
  Administered 2024-11-04 – 2024-11-14 (×42): 650 mg
  Filled 2024-11-04 (×43): qty 2

## 2024-11-04 MED ORDER — DEXTROSE 5 % IV SOLN
2.0000 mg | Freq: Every day | INTRAVENOUS | Status: AC
  Administered 2024-11-04 – 2024-11-09 (×6): 2 mg via INTRAVENOUS
  Filled 2024-11-04 (×6): qty 5

## 2024-11-04 MED ORDER — TRACE MINERALS CU-MN-SE-ZN 300-55-60-3000 MCG/ML IV SOLN
INTRAVENOUS | Status: AC
  Filled 2024-11-04: qty 345.6

## 2024-11-04 MED ORDER — HYDROMORPHONE HCL 1 MG/ML IJ SOLN
0.5000 mg | INTRAMUSCULAR | Status: DC | PRN
  Administered 2024-11-04 – 2024-11-09 (×20): 0.5 mg via INTRAVENOUS
  Filled 2024-11-04 (×20): qty 0.5

## 2024-11-04 MED ORDER — POTASSIUM PHOSPHATES 15 MMOLE/5ML IV SOLN
15.0000 mmol | Freq: Once | INTRAVENOUS | Status: AC
  Administered 2024-11-04: 15 mmol via INTRAVENOUS
  Filled 2024-11-04: qty 5

## 2024-11-04 MED ORDER — SERTRALINE HCL 100 MG PO TABS
100.0000 mg | ORAL_TABLET | Freq: Every day | ORAL | Status: DC
  Administered 2024-11-04 – 2024-11-06 (×3): 100 mg
  Filled 2024-11-04 (×3): qty 1

## 2024-11-04 MED ORDER — HYDROCORTISONE SOD SUC (PF) 100 MG IJ SOLR: 50.0000 mg | Freq: Two times a day (BID) | INTRAMUSCULAR | Status: DC

## 2024-11-04 MED ORDER — OXYCODONE HCL 5 MG PO TABS
5.0000 mg | ORAL_TABLET | ORAL | Status: DC | PRN
  Administered 2024-11-05: 5 mg
  Filled 2024-11-04: qty 1

## 2024-11-04 MED ORDER — POTASSIUM CHLORIDE 10 MEQ/50ML IV SOLN
10.0000 meq | INTRAVENOUS | Status: AC
  Administered 2024-11-04 (×6): 10 meq via INTRAVENOUS
  Filled 2024-11-04 (×6): qty 50

## 2024-11-04 MED ORDER — LEVOTHYROXINE SODIUM 112 MCG PO TABS
112.0000 ug | ORAL_TABLET | Freq: Every day | ORAL | Status: DC
  Administered 2024-11-05 – 2024-11-14 (×10): 112 ug
  Filled 2024-11-04 (×13): qty 1

## 2024-11-04 MED ORDER — SPIRONOLACTONE 12.5 MG HALF TABLET
12.5000 mg | ORAL_TABLET | Freq: Every day | ORAL | Status: DC
  Administered 2024-11-04 – 2024-11-11 (×8): 12.5 mg
  Filled 2024-11-04 (×8): qty 1

## 2024-11-04 MED ORDER — ZINC SULFATE 220 (50 ZN) MG PO CAPS
220.0000 mg | ORAL_CAPSULE | Freq: Every day | ORAL | Status: DC
  Administered 2024-11-04 – 2024-11-10 (×7): 220 mg
  Filled 2024-11-04 (×7): qty 1

## 2024-11-04 MED ORDER — VITAL AF 1.2 CAL PO LIQD
1000.0000 mL | ORAL | Status: DC
  Administered 2024-11-04: 1000 mL

## 2024-11-04 NOTE — Progress Notes (Signed)
 Afternoon round: Extubated this morning without incident. On room air. Mentation is clearing slowly. Waiting for repeat labs -> concern for refeeding syndrome this morning. Nephew came today. States that she is more child like in her behavior at baseline for reference. Diuresed well today with 40 Lasix . May move out of unit tomorrow if still progressing pending okay from surgery

## 2024-11-04 NOTE — Plan of Care (Signed)
  Problem: Nutrition: Goal: Adequate nutrition will be maintained Outcome: Progressing   Problem: Elimination: Goal: Will not experience complications related to urinary retention Outcome: Progressing   Problem: Pain Managment: Goal: General experience of comfort will improve and/or be controlled Outcome: Progressing   Problem: Respiratory: Goal: Ability to maintain a clear airway and adequate ventilation will improve Outcome: Progressing

## 2024-11-04 NOTE — Progress Notes (Addendum)
 PHARMACY - ANTICOAGULATION CONSULT NOTE  Pharmacy Consult for heparin  infusion Indication: postop vascular procedure  Allergies  Allergen Reactions   Ketoconazole Hives, Swelling and Other (See Comments)    SWELLING REACTION UNSPECIFIED    Pravachol  [Pravastatin  Sodium] Shortness Of Breath, Swelling and Anaphylaxis    Throat swelling   Statins Other (See Comments)    Leg cramps   Tape Dermatitis and Other (See Comments)    Steri-Strips   Repatha  [Evolocumab ] Other (See Comments)    Made the stomach hurt badly.   Codeine Other (See Comments) and Hypertension    Increased B/P    Patient Measurements: Height: 5' 4 (162.6 cm) Weight: 71.6 kg (157 lb 13.6 oz) IBW/kg (Calculated) : 54.7 HEPARIN  DW (KG): 59.9  Vital Signs: Temp: 99.6 F (37.6 C) (11/24 1100) Temp Source: Axillary (11/24 1100) BP: 108/67 (11/24 1801) Pulse Rate: 71 (11/24 1801)  Labs: Recent Labs    11/02/24 0147 11/02/24 0247 11/02/24 0355 11/02/24 0853 11/02/24 1631 11/02/24 2053 11/03/24 0353 11/03/24 1935 11/04/24 0500 11/04/24 1729  HGB  --    < >  --  9.8* 9.8*  --  9.2*  --  7.3*  --   HCT  --    < >  --  29.2* 29.5*  --  27.5*  --  22.3*  --   PLT 8*  --   --  44* 84*  --  64*  --  87*  --   APTT 29  --   --   --   --   --   --   --   --   --   LABPROT 13.3  --  12.7 12.2  --   --   --   --   --   --   INR 1.0  --  0.9 0.9  --   --   --   --   --   --   HEPARINUNFRC  --   --   --   --   --    < > <0.10*  --  <0.10* <0.10*  CREATININE  --    < > 1.13*  1.13*  --  1.07*  --  1.20* 0.95 0.74 0.60   < > = values in this interval not displayed.    Estimated Creatinine Clearance: 75.3 mL/min (by C-G formula based on SCr of 0.6 mg/dL).   Assessment: 58 yo F presenting with E. Coli bacteremia 2/2 SBO with perforation. On 11/22 underwent closure of abdomen in OR and repair of left radial artery. Not on AC PTA. Pharmacy consulted for heparin  management.  Received 3 units of platelets  11/22.   Hgb is low 7.3, platelets up to 87. No s/sx of bleeding or infusion issues.  Okay per vascular and CCM to titrate up on heparin  infusion for critical L hand ischemia on 11/04/24.    Of note, HIT AB = 0.039 , negative HL <0.10 - subtherapeutic on 700 u/hr  No issues with line/infusion and no s/sx bleeding per bedside RN  Goal of Therapy:  Heparin  level 0.3-0.5 units/ml Monitor platelets by anticoagulation protocol: Yes   Plan:  Increase heparin  infusion to 850 units/hr  Check HL in 6 hours  Monitor daily heparin  level, CBC, and signs/symptoms of bleeding  Sharyne Glatter, PharmD, BCCCP Critical Care Clinical Pharmacist 11/04/2024 6:43 PM

## 2024-11-04 NOTE — Progress Notes (Signed)
 PHARMACY - TOTAL PARENTERAL NUTRITION CONSULT NOTE   Indication: Small bowel obstruction   Patient Measurements: Height: 5' 4 (162.6 cm) Weight: 71.6 kg (157 lb 13.6 oz) IBW/kg (Calculated) : 54.7 TPN AdjBW (KG): 59.9 Body mass index is 27.09 kg/m.  Assessment: 58 years of age female with history of Roux-en-Y gastric bypass admitted for septic shock and acute abdomen who went emergently on 11/18 for exploratory surgery requiring lysis of adhesions, G-tube placement and wound vac. She continued to be in sepsis and returned to OR for repeat exlap 11/20 but no sources of ischemic bowel found. 11/22 back to OR for exlap and abdominal closure. Day 6 of NPO status inpatient. Pharmacy consulted for TPN. EF from POCUS 11/22 estimated at 35-40% (up from 20% on ECHO 11/20). High refeeding risk due to significant weight loss and inadequate oral intake for several months.    Low electrolytes noted 11/24 AM after starting TPN yesterday.   Glucose / Insulin : CBG 123-201 (7 units sSSI used). Off of hydrocortisone  now 11/24 AM.  A1c 5.3 Electrolytes: Na 136, K 2.9 (down), Phos 2.6 (down), Mg 1.7 (down). CoCa ~9.2. Renal: SCr 0.74 (down), BUN 31 (down).  Hepatic: LFTs trending down. Tbili 0.8 (down). TG 157 off prop. No jaundice. Intake / Output; MIVF: UOP 2.2 L (1.3 mL/kg/hr) last 24 hours after 40 mg IV lasix  yesterday (repeated today), Wound vac output 25 mL. G-tube output 25 mL. Net IO Since Admission: 13,453.5 mL [11/04/24 1120] GI Imaging: none since start of TPN GI Surgeries / Procedures:  11/18 Exlap, lysis of adhesions, G-tube placement, ABthera VAC placement 11/20 Exlap, left open 11/22 Exlap with abdominal closure  Central access: Triple lumen PICC TPN start date: 11/03/24  Nutritional Goals: Goal concentrated TPN rate is 58 mL/hr (provides 100 g of protein and 1800 kcals per day) Goal dextrose  (18.5%) Goal unconcentrated TPN rate is 75 mL/hr (provides 100 g of protein and 1915 kcals  per day)  RD Assessment: Estimated Needs Total Energy Estimated Needs: 1800-2000 kcals Total Protein Estimated Needs: 90-110 grams Total Fluid Estimated Needs: >/= 1.8L  Current Nutrition:  NPO Propofol  off currently  Vital high protein @ 10   Plan: 60 mEq KCL, 4 g mag ordered overnight. Give 15 mmol Kphos after KCL runs.  Continue concentrated TPN at 30 mL/hr at 1800 with reduced dextrose  (15%) due to refeeding risk - this provides ~50% of needs.  Electrolytes in TPN: Na 61mEq/L, K 60 mEq/L, Ca 2.5mEq/L, Mg 76mEq/L, and phos 15 mmol/L. Cl:Ac 1.5:1 (entered as max chloride due to bag constituents).  Add standard MVI and trace elements to TPN Continue Sensitive q4h SSI and adjust as needed. Since hydrocortisone  discontinued, will not increase scale at this time.  Monitor TPN labs on Mon/Thurs, daily for 3 days at least (ordered BID now) On Thiamine  IV outside of TPN.   Rankin Sams, PharmD, BCPS, BCCCP Clinical Pharmacist

## 2024-11-04 NOTE — Progress Notes (Addendum)
 Initial Nutrition Assessment  DOCUMENTATION CODES:   Non-severe (moderate) malnutrition in context of chronic illness  INTERVENTION:   Continue TPN; discussed plan with TPN Pharmacist and plan to keep at current rate today. Recommend pt demonstrate tolerance of TF at goal rate prior to full discontinuation of TPN  Tube Feeding via G-tube: Change to Vital AF 1.2 at 20 ml/hr, small increase in rate with additional increase in kcals from change of TF formula.. Plan to keep here until tomorrow secondary to refeeding  Goal TF:  Vital AF 1.2 at 60 ml/hr TF at goal provides 1728 kcals, 108 g of protein and 1166 mL of free water   Refeeding: Phosphorus dropped by >40% in 24 hours post TPN initiation (phosphorus 2.6)-severe refeeding, K+ dropped to 2.9 (L)-28% drop. Noted pt received 15 mmol KPhos and 10 mEQ of KCL. Continue to closely monitor and trend electrolytes.   Continue IV Thiamine  Recommend addition of  IV copper  2mg  daily x 6 days Add Zinc  Sulfate 220 mg daily per tube for at least 14 days.   Plan to recheck values post supplementation with CRP  Serum Vitamin A  level returned low; assess for repletion once able to take meds by mouth  NUTRITION DIAGNOSIS:   Moderate Malnutrition related to chronic illness as evidenced by moderate fat depletion, moderate muscle depletion, percent weight loss (25% in 10 months).  Being addressed via nutrition support  GOAL:   Patient will meet greater than or equal to 90% of their needs  Progressing   MONITOR:   Vent status, Labs, Weight trends  REASON FOR ASSESSMENT:   Consult Enteral/tube feeding initiation and management, New TPN/TNA  ASSESSMENT:   58 y.o. female with PMH of Roux-en-Y gastric bypass (2018) who presented with abdominal pain, lactic acidosis and lipase greater than 2800. Admitted for SBO.  11/18 Admitted to [facility], OR: Ex Lap with LOA, G-tube placement in gastric remnant, wound VAC, vent postop 11/20 OR: Ex Lap with  washout, wound VAC 11/22 OR: Washout with abdominal closure, wound VAC to open wound; Ischemic hand with occluded L radial artery s/p revascularization by VVS 11/23 TPN initiated  Extubated this AM  Pt indicating loose teeth with difficulty chewing; RN also notes that pt has a hard time opening her mouth very wide, RD observed. Pt denies problems chewing or swallowingat baseline  NPO day 6, TPN initiated on NPO day 5 (11/23) TPN at 30 ml/hr Vital High Protein at 10 ml/hr ordered yesterday, currently infusing and pt appears to be tolerating  Wound VAC to abdomen with 25 mL today, 25 mL in previous 24 hours UOP 2.2 L in 24 hours  No BM since admission; noted pt with hx of constipation at baseline. Utilizes miralax  at home  Per previous RD note, Pt was taking Bariatric MVI in addition to Vit D + Calcium  +Vit K supplement and probiotic.Pt and niece confirm this on visit today.   Niece not present on visit this AM, RD later circled back this afternoon when Niece present. Niece, Harlene, reports pt lives at home with Thedora Sharper (Niece's 19 year old son). Niece also reports that pt watches Niece's youngest child during the week. Pt drives at baseline. Both nephew and niece report   They report pt was told to walk 150 minutes per week and has been walking 30 minutes most days. Pt and niece report pt has been stable on her feet, did have a stumble but this was related to hypoglycemia. Per report, pt not taking any meds  for DM but has been utilizing Dexcom to check her blood sugars.   +DTI to right heel and coccyx present on admission per RN.  L hand currently wrapped; lots of blistering per RN, better perfusion, thumb purple with tip dark.   Pt with hx of Roux-En-Y gastric bypass in April of 2018. Initial wt of 309 pounds with pre-op weight of 258 pounds per CCS MD notes. Noted pt required return to OR in 2017 for lap repair of recurrent hiatal hernia and placement of G-tube.G-tube fell out and was  not replaced.  Lots of loose skin from weight loss on exam.   Current wt 71.6 kg, admission wt 60 kg (132 pounds), weight up post op. Noted wt of 62.1 kg October of this year. +edema still present on exam. Niece reports pt with 40 pound wt loss in 6 months due to poor intake. Pt has been unable to eat more than bites at a time for months with episode of vomiting. >20% wt loss in 6 months  Micronutrient Labs (10/30/24): CRP: not checked Copper : 67 (L) Folate: 15.8(wdl) Vitamin A : 9.9 (L) Vitamin B12: 2693 (H)-elevated in presence of inflammation Zinc : 15(L)  Labs: Potassium 2.9 (L) Magnesium  1.7 (wdl)-trending down Phosphorus 2.6 (wdl)-trending down, >40% drop in value BUN 31 Creatinine wdl Sodium 136 (wdl) CBGs 05-798 (goal 140-180) TG 157 Hgb 7.3 (wdl) WBC 10.4 (wdl) Platelets 87K Albumin  <1.5   Meds: SS novolog  Mag Sulfate KCL Potassium phosphate  IV thiamine  IV Zosyn  Spironolactone  Lasix  40 mg IV x 1  NUTRITION - FOCUSED PHYSICAL EXAM:  Flowsheet Row Most Recent Value  Orbital Region Moderate depletion  Upper Arm Region Unable to assess  Thoracic and Lumbar Region Moderate depletion  Buccal Region Moderate depletion  Temple Region Severe depletion  Clavicle Bone Region Mild depletion  Clavicle and Acromion Bone Region Moderate depletion  Scapular Bone Region Severe depletion  Dorsal Hand Unable to assess  [edema]  Patellar Region Severe depletion  Anterior Thigh Region Severe depletion  Posterior Calf Region Severe depletion  Edema (RD Assessment) None  Hair Reviewed  Eyes Unable to assess  Mouth Other (Comment)  [loose teeth per pt,  signifcant white spots]  Skin Reviewed  Nails Reviewed    Diet Order:   Diet Order     None       EDUCATION NEEDS:   Not appropriate for education at this time  Skin:  Skin Assessment: Skin Integrity Issues: Skin Integrity Issues:: Incisions Incisions: Surgical on abdomen  Last BM:  PTA  Height:   Ht  Readings from Last 1 Encounters:  10/29/24 5' 4 (1.626 m)    Weight:   Wt Readings from Last 1 Encounters:  11/04/24 71.6 kg    Ideal Body Weight:  54.55 kg  BMI:  Body mass index is 27.09 kg/m.  Estimated Nutritional Needs:   Kcal:  1700-1900 kcals  Protein:  90-110 grams  Fluid:  >/= 1.8L   Betsey Finger MS, RDN, LDN, CNSC Registered Dietitian 3 Clinical Nutrition RD Inpatient Contact Info in Amion

## 2024-11-04 NOTE — Progress Notes (Signed)
 Heart Failure Navigator Progress Note  Assessed for Heart & Vascular TOC clinic readiness.  Patient does not meet criteria due she is being followed by the Advanced Heart Failure Team with Dr. Bensimhon. .   Navigator will sign off at this time.    Stephane Haddock, BSN, Scientist, Clinical (histocompatibility And Immunogenetics) Only

## 2024-11-04 NOTE — Evaluation (Signed)
 Physical Therapy Evaluation Patient Details Name: Morgan [name] MRN: [id number] DOB: Aug 13, 1966 Today's Date: 11/04/2024  History of Present Illness  Pt is 58 yo presenting to [facility] 11/18. Transferred to [facility]. Pt currently s/p exploratory laparotomy with g-tube placement on 11/18 due to SBO with possible perforation. Exploratory laparotomy repeat on 11/22 wound vac placed. Pt intubated on 11/18 until 11/24. PMH: SBO, roux-en-Y gastric bypass, DM, HTN, CAP, OSA.  Clinical Impression  Pt is presenting at Total to Max A +2 for bed mobility and Mod to max A sitting EOB. Pt was previously Mod I living with niece. Pt has family support at home. Due to pt current functional status, home set up and available assistance at home recommending skilled physical therapy services > 3 hours/day in order to address strength, balance and functional mobility to decrease risk for falls, injury, immobility, skin break down and re-hospitalization.          If plan is discharge home, recommend the following: A little help with walking and/or transfers;Assist for transportation;Assistance with cooking/housework;Help with stairs or ramp for entrance     Equipment Recommendations Hospital bed;Hoyer lift;Wheelchair (measurements PT);Wheelchair cushion (measurements PT)  Recommendations for Other Services  Rehab consult    Functional Status Assessment Patient has had a recent decline in their functional status and demonstrates the ability to make significant improvements in function in a reasonable and predictable amount of time.     Precautions / Restrictions Precautions Precautions: Fall Recall of Precautions/Restrictions: Impaired Precaution/Restrictions Comments: abdominal wound vac, g-tube, foley, PICC Restrictions Weight Bearing Restrictions Per Provider Order: No      Mobility  Bed Mobility Overal bed mobility: Needs Assistance Bed Mobility: Supine to Sit, Sit to Supine     Supine to sit: +2 for  safety/equipment, Max assist Sit to supine: Total assist, +2 for physical assistance   General bed mobility comments: Pt was Max A +2 to get to EOB with pt attempting to assist with LE to EOB and with reaching to roll. Pt was Total A to get back to supine.    Transfers   General transfer comment: unable at this time. Attempted scooting EOB pt posterior lean increased. unable to scoot up EOB     Balance Overall balance assessment: Needs assistance Sitting-balance support: Bilateral upper extremity supported, Feet supported Sitting balance-Leahy Scale: Poor Sitting balance - Comments: heavy Mod to max A posterior lean in sitting Postural control: Posterior lean       Pertinent Vitals/Pain Pain Assessment Pain Assessment: Faces Faces Pain Scale: Hurts little more Facial Expression: Grimacing Body Movements: Protection Muscle Tension: Relaxed Compliance with ventilator (intubated pts.): N/A Vocalization (extubated pts.): Sighing, moaning CPOT Total: 4 Pain Location: abdomen Pain Descriptors / Indicators: Grimacing, Guarding Pain Intervention(s): Monitored during session, Limited activity within patient's tolerance, Repositioned    Home Living Family/patient expects to be discharged to:: Private residence Living Arrangements: Other relatives (niece) Available Help at Discharge: Available 24 hours/day;Family Type of Home: Mobile home Home Access: Ramped entrance       Home Layout: One level Home Equipment: Agricultural Consultant (2 wheels);Toilet riser;BSC/3in1;Shower seat Additional Comments: in taken from previous note, Some was corroborated with pt. Previous note states pt lives in a house pt reports she lives in a double wide trailer.    Prior Function Prior Level of Function : Independent/Modified Independent         Extremity/Trunk Assessment   Upper Extremity Assessment Upper Extremity Assessment: Defer to OT evaluation    Lower Extremity  Assessment Lower Extremity  Assessment: Generalized weakness    Cervical / Trunk Assessment Cervical / Trunk Assessment: Normal  Communication   Communication Communication: Impaired Factors Affecting Communication: Difficulty expressing self    Cognition Arousal: Alert Behavior During Therapy: Flat affect   PT - Cognitive impairments: Sequencing, Problem solving, Safety/Judgement, Awareness, Memory, Orientation, No family/caregiver present to determine baseline   Orientation impairments: Time, Situation     Following commands: Impaired Following commands impaired: Follows one step commands with increased time     Cueing Cueing Techniques: Verbal cues, Tactile cues, Visual cues     General Comments General comments (skin integrity, edema, etc.): vital signs stalbe on 4L O2 via  during session        Assessment/Plan    PT Assessment Patient needs continued PT services  PT Problem List Decreased strength;Decreased activity tolerance;Decreased balance;Decreased mobility;Decreased coordination;Decreased cognition;Decreased safety awareness       PT Treatment Interventions DME instruction;Balance training;Gait training;Stair training;Therapeutic activities;Therapeutic exercise;Patient/family education;Functional mobility training    PT Goals (Current goals can be found in the Care Plan section)  Acute Rehab PT Goals PT Goal Formulation: Patient unable to participate in goal setting Time For Goal Achievement: 11/18/24 Potential to Achieve Goals: Fair    Frequency Min 1X/week     Co-evaluation PT/OT/SLP Co-Evaluation/Treatment: Yes Reason for Co-Treatment: Complexity of the patient's impairments (multi-system involvement);Necessary to address cognition/behavior during functional activity;For patient/therapist safety;To address functional/ADL transfers PT goals addressed during session: Mobility/safety with mobility;Balance         AM-PAC PT 6 Clicks Mobility  Outcome Measure Help needed  turning from your back to your side while in a flat bed without using bedrails?: Total Help needed moving from lying on your back to sitting on the side of a flat bed without using bedrails?: Total Help needed moving to and from a bed to a chair (including a wheelchair)?: Total Help needed standing up from a chair using your arms (e.g., wheelchair or bedside chair)?: Total Help needed to walk in hospital room?: Total Help needed climbing 3-5 steps with a railing? : Total 6 Click Score: 6    End of Session   Activity Tolerance: Patient limited by fatigue Patient left: in bed;with call bell/phone within reach;with bed alarm set Nurse Communication: Mobility status PT Visit Diagnosis: Other abnormalities of gait and mobility (R26.89);Muscle weakness (generalized) (M62.81)    Time: 8499-8476 PT Time Calculation (min) (ACUTE ONLY): 23 min   Charges:   PT Evaluation $PT Eval Low Complexity: 1 Low   PT General Charges $$ ACUTE PT VISIT: 1 Visit        Morgan [name], DPT, CLT  Acute Rehabilitation Services Office: [phone] (Secure chat preferred)   Morgan VEAR [name] 11/04/2024, 3:55 PM

## 2024-11-04 NOTE — Evaluation (Signed)
 Occupational Therapy Evaluation Patient Details Name: Morgan [name] MRN: [id number] DOB: [birth year] Today's Date: 11/04/2024   History of Present Illness   Pt is 58 yo presenting to [facility] 11/18. Transferred to [facility]. Pt currently s/p exploratory laparotomy with g-tube placement on 11/18 due to SBO with possible perforation. Exploratory laparotomy repeat on 11/22 wound vac placed. Underwent L radial artery embolectomy 11/22. Pt intubated on 11/18 until 11/24. PMH: SBO, roux-en-Y gastric bypass, DM, HTN, CAP, OSA.     Clinical Impressions Pt questionable historian, reports she is ind at baseline with ADLs and uses a cane for mobility, lives with her niece. Pt currently needs min-total +2 for ADLs, total +2 for bed mobility and needs mod-max A posterior support to sit EOB. Pt with cervical and truncal extension with sitting EOB. VSS on 4L O2. Pt presenting with impairments listed below, will follow acutely. Patient will benefit from intensive inpatient follow-up therapy, >3 hours/day to maximize safety/ind with ADL/functional mobility.      If plan is discharge home, recommend the following:   Two people to help with walking and/or transfers;A lot of help with bathing/dressing/bathroom;Assistance with cooking/housework;Direct supervision/assist for medications management;Direct supervision/assist for financial management;Assist for transportation;Help with stairs or ramp for entrance     Functional Status Assessment   Patient has had a recent decline in their functional status and demonstrates the ability to make significant improvements in function in a reasonable and predictable amount of time.     Equipment Recommendations   Other (comment) (defer)     Recommendations for Other Services   PT consult;Rehab consult     Precautions/Restrictions   Precautions Precautions: Fall Recall of Precautions/Restrictions: Impaired Precaution/Restrictions Comments: abdominal wound vac,  g-tube, foley, PICC, O2 Restrictions Weight Bearing Restrictions Per Provider Order: No     Mobility Bed Mobility Overal bed mobility: Needs Assistance Bed Mobility: Supine to Sit, Sit to Supine     Supine to sit: +2 for safety/equipment, Max assist Sit to supine: Total assist, +2 for physical assistance   General bed mobility comments: pt with cervical and truncal extension, needs max A for posterior lean EOB    Transfers                   General transfer comment: unable at this time. Attempted scooting EOB pt posterior lean increased. unable to scoot up EOB      Balance Overall balance assessment: Needs assistance Sitting-balance support: Bilateral upper extremity supported, Feet supported Sitting balance-Leahy Scale: Poor Sitting balance - Comments: heavy Mod to max A posterior lean in sitting Postural control: Posterior lean                                 ADL either performed or assessed with clinical judgement   ADL Overall ADL's : Needs assistance/impaired   Eating/Feeding Details (indicate cue type and reason): g-tube Grooming: Minimal assistance;Sitting;Bed level   Upper Body Bathing: Moderate assistance;Bed level   Lower Body Bathing: Maximal assistance;Bed level   Upper Body Dressing : Moderate assistance;Bed level;Sitting   Lower Body Dressing: Bed level;Total assistance   Toilet Transfer: +2 for physical assistance;Total assistance   Toileting- Clothing Manipulation and Hygiene: Total assistance       Functional mobility during ADLs: Total assistance;+2 for physical assistance       Vision   Additional Comments: reports blurred vision, able to accurately identify number of digits held up in different visual  fields     Perception Perception: Not tested       Praxis Praxis: Not tested       Pertinent Vitals/Pain Pain Assessment Pain Assessment: Faces Pain Score: 4  Faces Pain Scale: Hurts little more Pain  Location: abdomen Pain Descriptors / Indicators: Grimacing, Guarding Pain Intervention(s): Limited activity within patient's tolerance, Monitored during session, Repositioned     Extremity/Trunk Assessment Upper Extremity Assessment Upper Extremity Assessment: Generalized weakness (edematous BUE, globally 2/5 on R, 2+/5 on L); incr tone/flexion at DIPs, impaired extension   Lower Extremity Assessment Lower Extremity Assessment: Defer to PT evaluation   Cervical / Trunk Assessment Cervical / Trunk Assessment: Other exceptions Cervical / Trunk Exceptions: cervical extension   Communication Communication Communication: Impaired Factors Affecting Communication: Difficulty expressing self   Cognition Arousal: Alert Behavior During Therapy: Flat affect Cognition: Cognition impaired   Orientation impairments: Situation, Time (states it is 2015) Awareness: Online awareness impaired                         Following commands: Impaired Following commands impaired: Follows one step commands with increased time     Cueing  General Comments   Cueing Techniques: Verbal cues;Tactile cues;Visual cues  VSS   Exercises     Shoulder Instructions      Home Living Family/patient expects to be discharged to:: Private residence Living Arrangements: Other relatives (niece) Available Help at Discharge: Available 24 hours/day;Family Type of Home: Mobile home Home Access: Ramped entrance     Home Layout: One level     Bathroom Shower/Tub: Tub/shower unit;Walk-in shower   Bathroom Toilet: Standard     Home Equipment: Agricultural Consultant (2 wheels);Toilet riser;BSC/3in1;Shower seat;Cane - single point   Additional Comments: pt questionable historian at this time      Prior Functioning/Environment Prior Level of Function : Independent/Modified Independent             Mobility Comments: reports use of cane ADLs Comments: ind with ADL    OT Problem List: Decreased  strength;Decreased range of motion;Decreased activity tolerance;Impaired balance (sitting and/or standing);Impaired vision/perception;Decreased cognition;Decreased safety awareness;Impaired UE functional use;Increased edema   OT Treatment/Interventions: Self-care/ADL training;Therapeutic exercise;Energy conservation;DME and/or AE instruction;Therapeutic activities;Patient/family education;Balance training;Cognitive remediation/compensation;Visual/perceptual remediation/compensation      OT Goals(Current goals can be found in the care plan section)   Acute Rehab OT Goals Patient Stated Goal: none stated OT Goal Formulation: With patient Time For Goal Achievement: 11/18/24 Potential to Achieve Goals: Good ADL Goals Pt Will Perform Upper Body Dressing: with supervision;sitting Pt Will Perform Lower Body Dressing: with min assist;sitting/lateral leans;sit to/from stand Pt Will Transfer to Toilet: with min assist;stand pivot transfer;bedside commode Pt/caregiver will Perform Home Exercise Program: Increased ROM;Increased strength;Both right and left upper extremity;With minimal assist;With written HEP provided Additional ADL Goal #1: Pt will perform bed mobility min A in prep for seated ADLs   OT Frequency:  Min 2X/week    Co-evaluation PT/OT/SLP Co-Evaluation/Treatment: Yes Reason for Co-Treatment: Complexity of the patient's impairments (multi-system involvement);Necessary to address cognition/behavior during functional activity;For patient/therapist safety;To address functional/ADL transfers PT goals addressed during session: Mobility/safety with mobility;Balance OT goals addressed during session: ADL's and self-care      AM-PAC OT 6 Clicks Daily Activity     Outcome Measure Help from another person eating meals?: A Lot Help from another person taking care of personal grooming?: A Lot Help from another person toileting, which includes using toliet, bedpan, or urinal?: Total Help  from  another person bathing (including washing, rinsing, drying)?: A Lot Help from another person to put on and taking off regular upper body clothing?: A Lot Help from another person to put on and taking off regular lower body clothing?: Total 6 Click Score: 10   End of Session Equipment Utilized During Treatment: Oxygen  Nurse Communication: Mobility status  Activity Tolerance: Patient limited by fatigue Patient left: in bed;with call bell/phone within reach;with bed alarm set  OT Visit Diagnosis: Unsteadiness on feet (R26.81);Other abnormalities of gait and mobility (R26.89);Muscle weakness (generalized) (M62.81)                Time: 8496-8473 OT Time Calculation (min): 23 min Charges:  OT General Charges $OT Visit: 1 Visit OT Evaluation $OT Eval Moderate Complexity: 1 Mod  Canaan Prue K, OTD, OTR/L SecureChat Preferred Acute Rehab [phone]   Francena Zender K Koonce 11/04/2024, 4:11 PM

## 2024-11-04 NOTE — Progress Notes (Addendum)
 Advanced Heart Failure Rounding Note  Patient Profile:    58 y/o female admitted w/ SBO and septic shock d/t e.coli bacteremia, s/p exploratory laparotomy w/ lysis of adhesions and placement of gastrostomy tube. Post op course c/b new systolic heart failure w/ CS and acute radial artery occlusion/ischemic left hand requiring radial artery embolectomy.   Subjective:    11/22 To OR after ab wound closure and revasc of LUE   Intubated, awake on vent and calm. Following commands.   Continues on DBA 2.5, Co-ox 76%. Given IV Lasix  yesterday w/ TPN to keep even. Slightly net negative for the day. CVP 6 today.   AKI resolved, SCr 1.20>>0.74   POCUS ECHO EF 30-35% with RWMA    Objective:   Weight Range:  Vital Signs:   Temp:  [97.3 F (36.3 C)-99.1 F (37.3 C)] 98.4 F (36.9 C) (11/24 0745) Pulse Rate:  [74-106] 81 (11/24 0745) Resp:  [12-35] 13 (11/24 0745) BP: (77-146)/(33-95) 96/70 (11/24 0745) SpO2:  [93 %-99 %] 98 % (11/24 0745) FiO2 (%):  [40 %] 40 % (11/24 0745) Weight:  [71.6 kg] 71.6 kg (11/24 0500) Last BM Date :  (PTA)  Weight change: Filed Weights   11/02/24 0030 11/03/24 0500 11/04/24 0500  Weight: 77.8 kg 80.1 kg 71.6 kg    Intake/Output:   Intake/Output Summary (Last 24 hours) at 11/04/2024 0751 Last data filed at 11/04/2024 0700 Gross per 24 hour  Intake 1800.31 ml  Output 2251 ml  Net -450.69 ml     Physical Exam: CVP 6  GENERAL: intubated, awake and calm  Lungs- intubated and clear  CARDIAC:  JVP 6-7 cm         Normal rate with regular rhythm. No MRG. Trace-1+ b/l LEE  ABDOMEN: Soft, non-tender, non-distended + surgical wound w/ presence of wound vac  EXTREMITIES: Warm and well perfused.  NEUROLOGIC: intubated, follows commands    Telemetry: NSR 80s Personally reviewed  Labs: Basic Metabolic Panel: Recent Labs  Lab 10/31/24 0446 10/31/24 0614 10/31/24 0931 11/01/24 0450 11/01/24 0807 11/02/24 0355 11/02/24 1631 11/03/24 0353  11/03/24 1935 11/04/24 0500  NA 133*  --    < > 126*   < > 129* 132* 133* 135 136  K 2.9*  --    < > 3.9   < > 3.6 3.7 3.8 4.0 2.9*  CL 96*  --    < > 90*  --  88* 90* 91* 96* 94*  CO2 27  --    < > 28  --  30 25 29 27  34*  GLUCOSE 136*  --    < > 83  --  95 76 86 180* 188*  BUN 26*  --    < > 28*  --  34* 35* 40* 36* 31*  CREATININE 1.12*  --    < > 1.02*  --  1.13* 1.07* 1.20* 0.95 0.74  CALCIUM  6.3*  --    < > 7.1*  --  6.9* 7.2* 7.2* 7.2* 7.2*  MG 2.0  --   --  2.1  --  2.1  --  2.2  --  1.7  PHOS  --  3.6  --  4.8*  --  5.8*  --  4.6  --  2.6   < > = values in this interval not displayed.    Liver Function Tests: Recent Labs  Lab 10/30/24 1141 11/01/24 0450 11/01/24 0920 11/02/24 0355 11/03/24 0353 11/04/24 0500  AST 123* 124*  --  78* 80* 88*  ALT 130* 103*  --  72* 66* 64*  ALKPHOS 48 62  --  44 54 48  BILITOT <0.2 0.7 0.7 0.8 1.3* 0.8  PROT 3.4* 4.3*  --  3.8* 4.2* 4.1*  ALBUMIN  2.2* 2.1*  --  <1.5* 1.6* <1.5*   Recent Labs  Lab 10/29/24 1541 10/30/24 0220 10/31/24 0446 11/01/24 0450  LIPASE >2,800* 882* 78* 22   No results for input(s): AMMONIA in the last 168 hours.  CBC: Recent Labs  Lab 10/29/24 1541 10/29/24 2131 10/31/24 0736 10/31/24 0931 11/01/24 1544 11/02/24 0136 11/02/24 0147 11/02/24 0247 11/02/24 0853 11/02/24 1631 11/03/24 0353 11/04/24 0500  WBC 1.1*   < > 11.6*   < > 9.6  --   --   --  13.7* 12.9* 11.1* 10.4  NEUTROABS 1.0*  --  10.5*  --   --   --   --   --   --   --   --   --   HGB 13.0   < > 12.4   < > 10.0*   < >  --  10.5* 9.8* 9.8* 9.2* 7.3*  HCT 42.4   < > 38.0   < > 30.2*   < >  --  31.0* 29.2* 29.5* 27.5* 22.3*  MCV 94.4   < > 88.8   < > 88.0  --   --   --  85.6 86.0 85.1 87.8  PLT 362   < > 45*   < > 15*  --  8*  --  44* 84* 64* 87*   < > = values in this interval not displayed.    Cardiac Enzymes: No results for input(s): CKTOTAL, CKMB, CKMBINDEX, TROPONINI in the last 168 hours.  BNP: BNP (last 3  results) No results for input(s): BNP in the last 8760 hours.  ProBNP (last 3 results) Recent Labs    11/01/24 0920  PROBNP >35,000.0*      Other results:  Imaging: No results found.    Medications:     Scheduled Medications:  sodium chloride    Intravenous Once   Chlorhexidine  Gluconate Cloth  6 each Topical Daily   feeding supplement (VITAL HIGH PROTEIN)  1,000 mL Per Tube Q24H   hydrocortisone  sod succinate (SOLU-CORTEF ) inj  100 mg Intravenous BID   insulin  aspart  0-9 Units Subcutaneous Q4H   mouth rinse  15 mL Mouth Rinse Q2H   pantoprazole  (PROTONIX ) IV  40 mg Intravenous Q12H   thiamine  (VITAMIN B1) injection  100 mg Intravenous Q24H    Infusions:  DOBUTamine  2.5 mcg/kg/min (11/04/24 0700)   fentaNYL  infusion INTRAVENOUS 125 mcg/hr (11/04/24 0700)   heparin  500 Units/hr (11/04/24 0700)   magnesium  sulfate bolus IVPB 50 mL/hr at 11/04/24 0700   micafungin  (MYCAMINE ) 100 mg in sodium chloride  0.9 % 100 mL IVPB Stopped (11/03/24 1106)   piperacillin -tazobactam (ZOSYN )  IV 12.5 mL/hr at 11/04/24 0700   potassium chloride  50 mL/hr at 11/04/24 0700   potassium PHOSPHATE  IVPB (in mmol)     propofol  (DIPRIVAN ) infusion Stopped (11/02/24 1901)   TPN ADULT (ION) 30 mL/hr at 11/04/24 0700    PRN Medications: acetaminophen , fentaNYL , sodium chloride  flush   Assessment:   1. Septic shock with e.coli bacteremia +/- component of cardiogenic shock New Cardiomyopathy -  - Patient was admitted with small bowel obstruction and septic shock. Patient underwent exploratory laparotomy exploratory laparotomy was found to have a densities at the end obstructing the proximal small bowel  near Roux-en-Y anastomosis.   - Bcx + for e.coli - abx and stress-dose steroids per CCM - ab closure on 11/22 - Echo showed LVEF of 20-25% with multiple wall motion abnormalities  - POCUS ECHO 11/22 EF ~30-35% with RWMA concerning for underlying CAD - remains on DBA 2.5 for cardiac support,  Co-ox 76%. Wean DBA down to 1 mcg - CVP 6, continue IV Lasix  40 mg daily w/ TPN to keep I/Os even  - Add spiro 12.5 mg daily  - May need cath prior to d/c if EF not improving (pending improvement of thrombocytopenia and overall condition). Would do cath femorally  2. Acute systolic HF - plan as above.  - wean DBA to 1 mcg and follow co-ox  - continue IV Lasix  40 mg x 1 - add spiro 12.5. mg daily   3. Acute hypoxic resp failure - CCM managing - failed vent wean yesterday, retrial planned today   4. Non-Obstructive CAD - Coronary CTA in 09/2024 showed coronary calcium  score of 315 (90th percentile for age and sex) and mild mixed nonobstructive CAD with 1-24% stenosis of the proximal RCA and 25-49% stenosis of LAD. - No Aspirin  given severe thrombocytopenia.  - Intolerant to statins in the past.  - Echo concerning for new RWMA. Will need to consider cath prior to d/c   5. Ischemic Left Hand - VVS following. S/p revasc 11/22   6. Severe thrombocytopenia - PLTS 8k -> 64->87K - due to sepsis - supportive care  7. Hypokalemia/Hypomagnesemia - K 2.9, Mg 1.7  - getting IV supp  - add spiro to help w/ K    CRITICAL CARE Performed by: Caffie Shed   Total critical care time: 15 minutes  Critical care time was exclusive of separately billable procedures and treating other patients.  Critical care was necessary to treat or prevent imminent or life-threatening deterioration.  Critical care was time spent personally by me on the following activities: development of treatment plan with patient and/or surrogate as well as nursing, discussions with consultants, evaluation of patient's response to treatment, examination of patient, obtaining history from patient or surrogate, ordering and performing treatments and interventions, ordering and review of laboratory studies, ordering and review of radiographic studies, pulse oximetry and re-evaluation of patient's condition.     Length  of Stay: 6   Brittainy Shed RIGGERS  11/04/2024, 7:51 AM  Advanced Heart Failure Team Pager [phone] (M-F; 7a - 4p)  Please contact [facility] for night-coverage after hours (4p -7a ) and weekends on [email or web address]   Agree with above.   Remains intubated. Awake on vent  On DBA 2.5 co-ox looks good. Responded well to IV lasix  overnight.   On TPN  General:  Awake on vent HEENT: normal + ETT Neck: supple. JVP up Cor: Regular rate & rhythm. No rubs, gallops or murmurs. Lungs: clear Abdomen: soft, nontender, nondistended+ wound vac Extremities: no cyanosis, clubbing, rash, 2+ edema L thumb wrapped  Neuro: awake on vent  Co-ox looks good on DBA 2.5. Will drop to 1. Continue diuresis. Hopefully can extubate today   Supp electroytes. Will add spiro.   Consider eventual cath (from femoral) prior to d/c   CRITICAL CARE Performed by: Cherrie Sieving  Total critical care time: 41 minutes  Critical care time was exclusive of separately billable procedures and treating other patients.  Critical care was necessary to treat or prevent imminent or life-threatening deterioration.  Critical care was time spent personally by me (independent of midlevel providers  or residents) on the following activities: development of treatment plan with patient and/or surrogate as well as nursing, discussions with consultants, evaluation of patient's response to treatment, examination of patient, obtaining history from patient or surrogate, ordering and performing treatments and interventions, ordering and review of laboratory studies, ordering and review of radiographic studies, pulse oximetry and re-evaluation of patient's condition.  Toribio Fuel, MD  11:50 AM

## 2024-11-04 NOTE — Progress Notes (Signed)
  Daily Progress Note  S/p: SBO, LOA, E. coli bacteremia, open abdomen, significant pressor requirement, occluded left radial artery, hand ischemia, thrombocytopenia.  2 Days Post-Op  Radial artery embolectomy, abdominal closure  Subjective: Intubated, alert, follow commands  Objective: Vitals:   11/04/24 0600 11/04/24 0615  BP: 103/60 99/63  Pulse: 76 74  Resp: 15 15  Temp: 97.7 F (36.5 C) 97.7 F (36.5 C)  SpO2: 97% 97%    Physical Examination Intubated, sedated Left thumb with blistering.  Red, indurated Excellent signals throughout the hand and into the thumb.   ASSESSMENT/PLAN:  Patient is a 58 year old female currently with open abdomen status post SBO, LOA, E. coli bacteremia which required significant pressor.    1 Day Post-Op Radial artery embolectomy, abdominal closure  Excellent signals in the left wrist, left hand, left thumb. Pt maximally revascularization. Will defer further hand management to hand surgery.   I discussed the case Saturday with hand surgery, [name]. Recommend ASA and continued heparin  gtt (can increase to therapeutic at ICU discretion)  [name] MD MS [facility] [phone] 11/04/2024  6:43 AM

## 2024-11-04 NOTE — Progress Notes (Addendum)
 PHARMACY - ANTICOAGULATION CONSULT NOTE  Pharmacy Consult for heparin  infusion Indication: postop vascular procedure  Allergies  Allergen Reactions   Ketoconazole Hives, Swelling and Other (See Comments)    SWELLING REACTION UNSPECIFIED    Pravachol  [Pravastatin  Sodium] Shortness Of Breath, Swelling and Anaphylaxis    Throat swelling   Statins Other (See Comments)    Leg cramps   Tape Dermatitis and Other (See Comments)    Steri-Strips   Repatha  [Evolocumab ] Other (See Comments)    Made the stomach hurt badly.   Codeine Other (See Comments) and Hypertension    Increased B/P    Patient Measurements: Height: 5' 4 (162.6 cm) Weight: 71.6 kg (157 lb 13.6 oz) IBW/kg (Calculated) : 54.7 HEPARIN  DW (KG): 59.9  Vital Signs: Temp: 97.7 F (36.5 C) (11/24 0615) Temp Source: Esophageal (11/24 0400) BP: 99/63 (11/24 0615) Pulse Rate: 74 (11/24 0615)  Labs: Recent Labs    11/02/24 0147 11/02/24 0247 11/02/24 0853 11/02/24 1631 11/02/24 2053 11/03/24 0353 11/03/24 1935 11/04/24 0500  HGB  --    < > 9.8* 9.8*  --  9.2*  --  7.3*  HCT  --    < > 29.2* 29.5*  --  27.5*  --  22.3*  PLT 8*  --  44* 84*  --  64*  --  87*  APTT 29  --   --   --   --   --   --   --   LABPROT 13.3  --  12.2  --   --   --   --   --   INR 1.0  --  0.9  --   --   --   --   --   HEPARINUNFRC  --   --   --   --  <0.10* <0.10*  --  <0.10*  CREATININE  --    < >  --  1.07*  --  1.20* 0.95 0.74   < > = values in this interval not displayed.    Estimated Creatinine Clearance: 75.3 mL/min (by C-G formula based on SCr of 0.74 mg/dL).   Medical History: Past Medical History:  Diagnosis Date   Allergic rhinitis    Anemia     after gastric bypass in 2018   Anxiety    Barrett's esophagus    Bipolar affective disorder (HCC)    CAP (community acquired pneumonia) 07/20/2018   Chronic respiratory failure (HCC)    Constipation, chronic    Degenerative joint disease of spine    Depression    Diabetes  mellitus without complication (HCC)    type 2    DM (diabetes mellitus) (HCC)    hypoglycemic per pt - no longer takes DM meds due to weight loss from gastric bypass in 2018   Dry eye    Family history of adverse reaction to anesthesia    nausea and vomiting   Gastroparesis    GERD (gastroesophageal reflux disease)    H/O shoulder replacement 08/11/2020   left shoulder   History of bariatric surgery 03/2017   History of hiatal hernia    HLD (hyperlipidemia) 03/12/2013   Hyperlipidemia    Hypertension    No HTN meds since weight loss from Gastric Bypass in 2018   Intractable chronic migraine without aura 06/04/2015   Migraine headache    Morbid obesity (HCC)    OSA (obstructive sleep apnea)    No cpap since gastric surgery   Osteoarthritis    bilateral  knee   SBO (small bowel obstruction) (HCC) 03/19/2019   Sleep apnea    Unspecified hypothyroidism    Vitamin B 12 deficiency    Vitamin D  deficiency     Medications:  Scheduled:   sodium chloride    Intravenous Once   Chlorhexidine  Gluconate Cloth  6 each Topical Daily   feeding supplement (VITAL HIGH PROTEIN)  1,000 mL Per Tube Q24H   hydrocortisone  sod succinate (SOLU-CORTEF ) inj  100 mg Intravenous BID   insulin  aspart  0-9 Units Subcutaneous Q4H   mouth rinse  15 mL Mouth Rinse Q2H   pantoprazole  (PROTONIX ) IV  40 mg Intravenous Q12H   thiamine  (VITAMIN B1) injection  100 mg Intravenous Q24H   Infusions:   DOBUTamine  2.5 mcg/kg/min (11/04/24 0641)   fentaNYL  infusion INTRAVENOUS 125 mcg/hr (11/04/24 0641)   heparin  500 Units/hr (11/04/24 0641)   magnesium  sulfate bolus IVPB 4 g (11/04/24 0659)   micafungin  (MYCAMINE ) 100 mg in sodium chloride  0.9 % 100 mL IVPB Stopped (11/03/24 1106)   piperacillin -tazobactam (ZOSYN )  IV 12.5 mL/hr at 11/04/24 0641   potassium chloride  10 mEq (11/04/24 0654)   potassium PHOSPHATE  IVPB (in mmol)     propofol  (DIPRIVAN ) infusion Stopped (11/02/24 1901)   TPN ADULT (ION) 30 mL/hr at  11/04/24 9358    Assessment: 58 yo F presenting with E. Coli bacteremia 2/2 SBO with perforation. On 11/22 underwent closure of abdomen in OR and repair of left radial artery. Not on AC PTA. Pharmacy consulted for heparin  management.  Heparin  level today is < 0.1 as expected with low-dose fixed heparin  infusion. Per vascular surgery, plan to keep heparin  infusion at 500 units/hr and monitor platelets closely with significant thrombocytopenia this admission. Received 3 units of platelets 11/22. Hgb is low 7.3, platelets is up to 87. No s/sx of bleeding or infusion issues.   Goal of Therapy:  Heparin  level < 0.1 units/ml Monitor platelets by anticoagulation protocol: Yes   Plan:  Continue heparin  infusion at 500 units/hr with no titration Monitor daily heparin  level, CBC, and signs/symptoms of bleeding F/u daily with vascular surgery to determine plan for titration  ADDENDUM Okay per vascular and CCM to titrate up on heparin  infusion for critical L hand ischemia - will increase heparin  infusion to 700 units/hr and order level in 6 hours.   Thank you for allowing pharmacy to participate in this patient's care,  Suzen Sour, PharmD, BCCCP Clinical Pharmacist  Phone: [phone] 11/04/2024 9:55 AM  Please check AMION for all [facility] Pharmacy phone numbers After 10:00 PM, call Main Pharmacy [phone]

## 2024-11-04 NOTE — Telephone Encounter (Addendum)
 Patient Product/process Development Scientist completed.    The patient is insured through [facility]. Patient has Medicare and is not eligible for a copay card, but may be able to apply for patient assistance or Medicare RX Payment Plan (Patient Must reach out to their plan, if eligible for payment plan), if available.    Ran test claim for Farxiga 10 mg and the current 30 day co-pay is $0.00.  Ran test claim for Jardiance 10 mg and the current 30 day co-pay is $0.00.  Ran test claim for Entresto 24-26 mg and the current 30 day co-pay is $0.00.  This test claim was processed through [facility]- copay amounts may vary at other pharmacies due to pharmacy/plan contracts, or as the patient moves through the different stages of their insurance plan.     Reyes Sharps, CPHT Pharmacy Technician Patient Advocate Specialist Lead [facility] Patient Advocate Team Direct Number: [phone]  Fax: [phone]

## 2024-11-04 NOTE — Progress Notes (Signed)
 General Surgery Follow Up Note  Subjective:    Overnight Issues:   Objective:  Vital signs for last 24 hours: Temp:  [97.3 F (36.3 C)-99.1 F (37.3 C)] 98.4 F (36.9 C) (11/24 0730) Pulse Rate:  [74-106] 80 (11/24 0730) Resp:  [12-35] 19 (11/24 0730) BP: (77-146)/(33-95) 120/66 (11/24 0730) SpO2:  [93 %-99 %] 98 % (11/24 0730) FiO2 (%):  [40 %] 40 % (11/24 0400) Weight:  [71.6 kg] 71.6 kg (11/24 0500)  Hemodynamic parameters for last 24 hours: CVP:  [0 mmHg-7 mmHg] 5 mmHg  Intake/Output from previous day: 11/23 0701 - 11/24 0700 In: 1772.3 [I.V.:777.8; NG/GT:136.3; IV Piggyback:848.2] Out: 2251 [Urine:2201; Drains:50]  Intake/Output this shift: No intake/output data recorded.  Vent settings for last 24 hours: Vent Mode: PRVC FiO2 (%):  [40 %] 40 % Set Rate:  [15 bmp] 15 bmp Vt Set:  [480 mL] 480 mL PEEP:  [5 cmH20] 5 cmH20 Plateau Pressure:  [12 cmH20-14 cmH20] 12 cmH20  Physical Exam:  Gen: comfortable, no distress Neuro: not following commands HEENT: PERRL Neck: supple CV: RRR Pulm: unlabored breathing on mechanical ventilation Abd: soft, NT, midline vac in place  GU: urine clear and yellow, +Foley Extr: wwp, no edema  Results for orders placed or performed during the hospital encounter of 10/29/24 (from the past 24 hours)  Glucose, capillary     Status: Abnormal   Collection Time: 11/03/24 11:00 AM  Result Value Ref Range   Glucose-Capillary 123 (H) 70 - 99 mg/dL  Glucose, capillary     Status: Abnormal   Collection Time: 11/03/24  3:56 PM  Result Value Ref Range   Glucose-Capillary 113 (H) 70 - 99 mg/dL  Glucose, capillary     Status: Abnormal   Collection Time: 11/03/24  7:24 PM  Result Value Ref Range   Glucose-Capillary 134 (H) 70 - 99 mg/dL  Basic metabolic panel     Status: Abnormal   Collection Time: 11/03/24  7:35 PM  Result Value Ref Range   Sodium 135 135 - 145 mmol/L   Potassium 4.0 3.5 - 5.1 mmol/L   Chloride 96 (L) 98 - 111 mmol/L    CO2 27 22 - 32 mmol/L   Glucose, Bld 180 (H) 70 - 99 mg/dL   BUN 36 (H) 6 - 20 mg/dL   Creatinine, Ser 9.04 0.44 - 1.00 mg/dL   Calcium  7.2 (L) 8.9 - 10.3 mg/dL   GFR, Estimated >39 >39 mL/min   Anion gap 12 5 - 15  Glucose, capillary     Status: Abnormal   Collection Time: 11/03/24 11:06 PM  Result Value Ref Range   Glucose-Capillary 165 (H) 70 - 99 mg/dL  Glucose, capillary     Status: Abnormal   Collection Time: 11/04/24  3:30 AM  Result Value Ref Range   Glucose-Capillary 201 (H) 70 - 99 mg/dL  Cooxemetry Panel (carboxy, met, total hgb, O2 sat)     Status: Abnormal   Collection Time: 11/04/24  4:59 AM  Result Value Ref Range   Total hemoglobin 7.5 (L) 12.0 - 16.0 g/dL   O2 Saturation 76 %   Carboxyhemoglobin 2.1 (H) 0.5 - 1.5 %   Methemoglobin <0.7 0.0 - 1.5 %  Heparin  level (unfractionated)     Status: Abnormal   Collection Time: 11/04/24  5:00 AM  Result Value Ref Range   Heparin  Unfractionated <0.10 (L) 0.30 - 0.70 IU/mL  CBC     Status: Abnormal   Collection Time: 11/04/24  5:00  AM  Result Value Ref Range   WBC 10.4 4.0 - 10.5 K/uL   RBC 2.54 (L) 3.87 - 5.11 MIL/uL   Hemoglobin 7.3 (L) 12.0 - 15.0 g/dL   HCT 77.6 (L) 63.9 - 53.9 %   MCV 87.8 80.0 - 100.0 fL   MCH 28.7 26.0 - 34.0 pg   MCHC 32.7 30.0 - 36.0 g/dL   RDW 85.0 88.4 - 84.4 %   Platelets 87 (L) 150 - 400 K/uL   nRBC 0.0 0.0 - 0.2 %  Comprehensive metabolic panel     Status: Abnormal   Collection Time: 11/04/24  5:00 AM  Result Value Ref Range   Sodium 136 135 - 145 mmol/L   Potassium 2.9 (L) 3.5 - 5.1 mmol/L   Chloride 94 (L) 98 - 111 mmol/L   CO2 34 (H) 22 - 32 mmol/L   Glucose, Bld 188 (H) 70 - 99 mg/dL   BUN 31 (H) 6 - 20 mg/dL   Creatinine, Ser 9.25 0.44 - 1.00 mg/dL   Calcium  7.2 (L) 8.9 - 10.3 mg/dL   Total Protein 4.1 (L) 6.5 - 8.1 g/dL   Albumin  <1.5 (L) 3.5 - 5.0 g/dL   AST 88 (H) 15 - 41 U/L   ALT 64 (H) 0 - 44 U/L   Alkaline Phosphatase 48 38 - 126 U/L   Total Bilirubin 0.8 0.0 -  1.2 mg/dL   GFR, Estimated >39 >39 mL/min   Anion gap 8 5 - 15  Magnesium      Status: None   Collection Time: 11/04/24  5:00 AM  Result Value Ref Range   Magnesium  1.7 1.7 - 2.4 mg/dL  Phosphorus     Status: None   Collection Time: 11/04/24  5:00 AM  Result Value Ref Range   Phosphorus 2.6 2.5 - 4.6 mg/dL  Triglycerides     Status: Abnormal   Collection Time: 11/04/24  5:00 AM  Result Value Ref Range   Triglycerides 157 (H) <150 mg/dL    Assessment & Plan:  Present on Admission:  SBO (small bowel obstruction) s/p lap LOA 03/19/2019    LOS: 6 days   Additional comments:I reviewed the patient's new clinical lab test results.   and I reviewed the patients new imaging test results.    POD 6, s/p ex lap with LOA, g-tube placement in gastric remnant Dr. Vernetta 11/18 secondary to SBO with possible perforation POD 4, s/p ex lap with washout, Dr. Tanda 11/20 POD 2, s/p washout, abdominal closure Dr. Tanda 11/23 -cont zosyn , BC 2/4 positive for E coli.  Antifungal coverage added 11/20, on stress dose steroids -IV thiamine  for 3 days, completed -foley for urinary monitoring -plts 87K   FEN - NPO/IVFs per CCM, trickle TF VTE - lovenox  on hold due to thrombocytopenia ID - zosyn , micafungin    E coli bacteremia - continue zosyn  Septic shock - secondary to all of the above, pressors are weaning CAD/Heart failure - EF 20-25% on echo yesterday.  CCM engaged AKI - resolved DM - SSI Leukopenia - resolved, secondary to sepsis Pancreatitis - lipase 22, suspect reactive Thrombocytopenia - improving Hypocalcemia - per CCM Neck wound - unclear etiology, have asked nursing to place mepilex over this to protect it.  Avoid dressings or lines in this area. L hand ischemic changes -  VVS following, s/p embolectomy radial artery and repair, likely will need more surgery.  Morgan GEANNIE Hanger, MD Trauma & General Surgery Please use [email or web address] to contact on call provider  11/04/2024  *Care  during the described time interval was provided by me. I have reviewed this patient's available data, including medical history, events of note, physical examination and test results as part of my evaluation.

## 2024-11-04 NOTE — Procedures (Signed)
 Extubation Procedure Note  Patient Details:   Name: TOTIANA EVERSON DOB: [birth year] MRN: [id number]   Airway Documentation:    Vent end date: 11/04/24 Vent end time: 0912   Evaluation  O2 sats: stable throughout Complications: No apparent complications Patient did tolerate procedure well. Bilateral Breath Sounds: Clear   No  RT extubated patient to 4L [place] per MD order with RN at bedside. Positive cuff leak noted. No stridor or distress noted at this time. RT will continue to monitor as needed.   Paulla ONEIDA Gaskins 11/04/2024, 9:16 AM

## 2024-11-04 NOTE — Progress Notes (Addendum)
 NAME:  Morgan [name], MRN:  [id number], DOB:  06/16/1966, LOS: 6 ADMISSION DATE:  10/29/2024, CONSULTATION DATE:  10/30/2024 REFERRING MD:  Dr. Vernetta CHIEF COMPLAINT:  Small bowel Obstruction   Brief HPI  Briefly, 58 year old female with history of Roux-en-Y gastric bypass who presented to the emergency department yesterday evening with abdominal pain, lactic acidosis and lipase greater than 2800.  She had CCS evaluation and went for exploratory laparotomy.  Found to have dense adhesive band that was obstructing her proximal small bowel as well as biliary leak without evidence of perforation.  She had a gastrostomy tube placed in the left open with a wound VAC.  She is in continuity.  Also in septic shock, she returned from OR to ICU on Levophed , vasopressin , intubated on bicarb drip.  Pertinent  Medical History   Past Medical History:  Diagnosis Date   Allergic rhinitis    Anemia     after gastric bypass in 2018   Anxiety    Barrett's esophagus    Bipolar affective disorder (HCC)    CAP (community acquired pneumonia) 07/20/2018   Chronic respiratory failure (HCC)    Constipation, chronic    Degenerative joint disease of spine    Depression    Diabetes mellitus without complication (HCC)    type 2    DM (diabetes mellitus) (HCC)    hypoglycemic per pt - no longer takes DM meds due to weight loss from gastric bypass in 2018   Dry eye    Family history of adverse reaction to anesthesia    nausea and vomiting   Gastroparesis    GERD (gastroesophageal reflux disease)    H/O shoulder replacement 08/11/2020   left shoulder   History of bariatric surgery 03/2017   History of hiatal hernia    HLD (hyperlipidemia) 03/12/2013   Hyperlipidemia    Hypertension    No HTN meds since weight loss from Gastric Bypass in 2018   Intractable chronic migraine without aura 06/04/2015   Migraine headache    Morbid obesity (HCC)    OSA (obstructive sleep apnea)    No cpap since gastric  surgery   Osteoarthritis    bilateral knee   SBO (small bowel obstruction) (HCC) 03/19/2019   Sleep apnea    Unspecified hypothyroidism    Vitamin B 12 deficiency    Vitamin D  deficiency      Significant Hospital Events: Including procedures, antibiotic start and stop dates in addition to other pertinent events   11/18 admit 11/20 back to OR for look, weaning pressors  11/22 Trx to 2H due to left hand ischemia. Had radial artery embolectomy and abdominal washout & closure.  11/24: wean to extubate today  Interim History / Subjective:  NAEON; weaning sedation as she was too sleepy for early morning SBT - doing better now. Stopping stress dose steroids.   Objective    Blood pressure 99/63, pulse 74, temperature 97.7 F (36.5 C), resp. rate 15, height 5' 4 (1.626 m), weight 71.6 kg, last menstrual period 10/17/2017, SpO2 97%. CVP:  [0 mmHg-7 mmHg] 6 mmHg  Vent Mode: PRVC FiO2 (%):  [40 %] 40 % Set Rate:  [15 bmp] 15 bmp Vt Set:  [480 mL] 480 mL PEEP:  [5 cmH20] 5 cmH20 Pressure Support:  [8 cmH20] 8 cmH20 Plateau Pressure:  [12 cmH20-14 cmH20] 12 cmH20   Intake/Output Summary (Last 24 hours) at 11/04/2024 0645 Last data filed at 11/04/2024 0641 Gross per 24 hour  Intake 1803.69 ml  Output 2251 ml  Net -447.31 ml   Filed Weights   11/02/24 0030 11/03/24 0500 11/04/24 0500  Weight: 77.8 kg 80.1 kg 71.6 kg    Examination: General: middle aged female, critically ill appearing, laying in bed  HEENT: ncat, perrla, anicteric sclera, ett  CV:  s1s2, no m/r/g, rrr  pulm: resp even and unlabored; CTAB. On PSV 8/5 with ~ 6L MV Abs: rounded, soft, no TTP but on sedation/fentanyl . Gastric drain with bilious output, midline wound vac, absent bowel sounds  Extremities: left hand and thumb wrapped in dressing, warm; ischemic L thumb with surrounding mottleing of ventral hand and left 1st digit; RUE PICC line, no significant putting edema of lower extremities  Neuro: eyes are open on  exam but she is gazing upward; follows commands with wiggling toes; does not squeeze R hand but has some edema there GU: foley   Coox 76%  CVP 6 Hgb 7.3 (9.2) Plt 87 (64) K 2.9 Albumin  <1.5 AST/ALT 88/64  Resolved problem list   Leukopenia Metabolic acidosis Hypokalemia Hyperphosphatemia  Assessment and Plan   Septic shock due to perforated SBO with peritonitis, E Coli bacteremia SBO due to adhesion, s/p ex lap with LOA on 11/18; has had washouts on 11/20 and abdominal closure on 11/22. Has G-tube in gastric remnant History of Roux-en-Y bariatric surgery -post-op care per surgery -con't gastric drain to suction -Defer timing of initiation of TF to surgery - do not appreciate bowel sounds on exam today  - TPN started on 11/23 with low albumin , hx bariatric  - zosyn  until 11/27 for ecoli bactermia; micafungin  until 11/25 for peritonitis  - con't trying to avoid peripheral vasoconstrictors with hand ischemia - stop stress dose steroids today   Acute HFrEF- concerning given CAD hx vs septic cardiomyopathy  - on DBA @ 2.102mcg/kg/min  - avoid pressors  - CVP 7, Coox 76%  - HF following, appreciate help in management  - add GDMT per AHF  - may need cath prior to DC given CAD hx   Critical left hand ischemia due to thrombosed radial artery, s/p thrombectomy-previous left radial aline, d/c'd 11/20 after non-functioning with progressive ischemia and absent radial signals  - VVS following  - fixed dose heparin  - per VVS can increase to therapeutic levels - may hold today with 2g drop in hgb) - need to be careful with diuresis to prevent poor hand perfusion - further management per hand surgery   Thrombocytopenia due to sepsis,  - continuing to stabilize  - no need for platelet transfusion; monitor  Acute blood loss anemia - 2g drop overnight - no noted dark stools or increased wound vac output - monitor  -transfuse for Hb <7 or hemodynamically significant bleeding  Acute  hypoxic respiratory failure postoperatively RLL PNA- suspect aspiration from SBO - SAT/SBT - hope to extubate this morning  -LTVV -VAP prevention protocol -PAD protocol for sedation  Hypoglycemia  H/o DM2 but  A1c 5.3 and not on meds PTA-- resolved after Roux-en-Y? -monitor; not needing insulin  - watch with stopping steroids   Hypothyroidism - restart home synthroid  today   Acute kidney injury, improving -strict I/O -renally dose meds, avoid nephrotoxic meds  Elevated transaminases, hyperbilirubinemia - stabilizing  - bilirubin back to normal   Hypomagnesemia  - repleting   Depression/Anxiety  - restart home Zoloft  @ 100, can increase in next few days; if she has nausea - may be related to GI/serotonin stimulation  Labs   CBC: Recent Labs  Lab  10/29/24 1541 10/29/24 2131 10/31/24 0736 10/31/24 0931 11/01/24 1544 11/02/24 0136 11/02/24 0147 11/02/24 0247 11/02/24 0853 11/02/24 1631 11/03/24 0353 11/04/24 0500  WBC 1.1*   < > 11.6*   < > 9.6  --   --   --  13.7* 12.9* 11.1* 10.4  NEUTROABS 1.0*  --  10.5*  --   --   --   --   --   --   --   --   --   HGB 13.0   < > 12.4   < > 10.0*   < >  --  10.5* 9.8* 9.8* 9.2* 7.3*  HCT 42.4   < > 38.0   < > 30.2*   < >  --  31.0* 29.2* 29.5* 27.5* 22.3*  MCV 94.4   < > 88.8   < > 88.0  --   --   --  85.6 86.0 85.1 87.8  PLT 362   < > 45*   < > 15*  --  8*  --  44* 84* 64* 87*   < > = values in this interval not displayed.    Basic Metabolic Panel: Recent Labs  Lab 10/31/24 0446 10/31/24 0614 10/31/24 0931 11/01/24 0450 11/01/24 0807 11/02/24 0355 11/02/24 1631 11/03/24 0353 11/03/24 1935 11/04/24 0500  NA 133*  --    < > 126*   < > 129* 132* 133* 135 136  K 2.9*  --    < > 3.9   < > 3.6 3.7 3.8 4.0 2.9*  CL 96*  --    < > 90*  --  88* 90* 91* 96* 94*  CO2 27  --    < > 28  --  30 25 29 27  34*  GLUCOSE 136*  --    < > 83  --  95 76 86 180* 188*  BUN 26*  --    < > 28*  --  34* 35* 40* 36* 31*  CREATININE 1.12*   --    < > 1.02*  --  1.13* 1.07* 1.20* 0.95 0.74  CALCIUM  6.3*  --    < > 7.1*  --  6.9* 7.2* 7.2* 7.2* 7.2*  MG 2.0  --   --  2.1  --  2.1  --  2.2  --  1.7  PHOS  --  3.6  --  4.8*  --  5.8*  --  4.6  --  2.6   < > = values in this interval not displayed.   GFR: Estimated Creatinine Clearance: 75.3 mL/min (by C-G formula based on SCr of 0.74 mg/dL). Recent Labs  Lab 10/31/24 1240 10/31/24 1407 11/01/24 0450 11/01/24 1544 11/02/24 0355 11/02/24 0853 11/02/24 1631 11/03/24 0353 11/04/24 0500  WBC  --    < > 14.3* 9.6  --  13.7* 12.9* 11.1* 10.4  LATICACIDVEN 3.3*  --  1.7 1.4 1.1  --   --   --   --    < > = values in this interval not displayed.    Liver Function Tests: Recent Labs  Lab 10/30/24 1141 11/01/24 0450 11/01/24 0920 11/02/24 0355 11/03/24 0353 11/04/24 0500  AST 123* 124*  --  78* 80* 88*  ALT 130* 103*  --  72* 66* 64*  ALKPHOS 48 62  --  44 54 48  BILITOT <0.2 0.7 0.7 0.8 1.3* 0.8  PROT 3.4* 4.3*  --  3.8* 4.2* 4.1*  ALBUMIN  2.2* 2.1*  --  <  1.5* 1.6* <1.5*   Recent Labs  Lab 10/29/24 1541 10/30/24 0220 10/31/24 0446 11/01/24 0450  LIPASE >2,800* 882* 78* 22    CC: 34    The patient is critically ill with multiple organ system failure and requires high complexity decision making for assessment and support, frequent evaluation and titration of therapies, advanced monitoring, review of radiographic studies and interpretation of complex data.    Critical Care Time devoted to patient care services, exclusive of separately billable procedures, described in this note is 34  Tinnie FORBES Adolph DEVONNA [facility] 11/04/24 8:42 AM  Please see [email or web address] for pager details.  From 7A-7P if no response, please call [phone] After hours, please call ELink [phone]

## 2024-11-04 NOTE — Progress Notes (Signed)
 OT Cancellation Note  Patient Details Name: Morgan [name] MRN: [id number] DOB: 1966-01-20   Cancelled Treatment:    Reason Eval/Treat Not Completed: Medical issues which prohibited therapy (remains intubated, plan for extubation today, will follow up for OT eval as schedule permits)  Alya Smaltz K, OTD, OTR/L [facility]Chat [facility] [phone]   Laneta POUR Koonce 11/04/2024, 9:09 AM

## 2024-11-04 NOTE — Progress Notes (Deleted)
 PHARMACY - ANTICOAGULATION CONSULT NOTE  Pharmacy Consult for heparin  infusion Indication: postop vascular procedure  Allergies  Allergen Reactions   Ketoconazole Hives, Swelling and Other (See Comments)    SWELLING REACTION UNSPECIFIED    Pravachol  [Pravastatin  Sodium] Shortness Of Breath, Swelling and Anaphylaxis    Throat swelling   Statins Other (See Comments)    Leg cramps   Tape Dermatitis and Other (See Comments)    Steri-Strips   Repatha  [Evolocumab ] Other (See Comments)    Made the stomach hurt badly.   Codeine Other (See Comments) and Hypertension    Increased B/P    Patient Measurements: Height: 5' 4 (162.6 cm) Weight: 71.6 kg (157 lb 13.6 oz) IBW/kg (Calculated) : 54.7 HEPARIN  DW (KG): 59.9  Vital Signs: Temp: 97.7 F (36.5 C) (11/24 0615) Temp Source: Esophageal (11/24 0400) BP: 99/63 (11/24 0615) Pulse Rate: 74 (11/24 0615)  Labs: Recent Labs    11/02/24 0147 11/02/24 0247 11/02/24 0853 11/02/24 1631 11/02/24 2053 11/03/24 0353 11/03/24 1935 11/04/24 0500  HGB  --    < > 9.8* 9.8*  --  9.2*  --  7.3*  HCT  --    < > 29.2* 29.5*  --  27.5*  --  22.3*  PLT 8*  --  44* 84*  --  64*  --  87*  APTT 29  --   --   --   --   --   --   --   LABPROT 13.3  --  12.2  --   --   --   --   --   INR 1.0  --  0.9  --   --   --   --   --   HEPARINUNFRC  --   --   --   --  <0.10* <0.10*  --  <0.10*  CREATININE  --    < >  --  1.07*  --  1.20* 0.95 0.74   < > = values in this interval not displayed.    Estimated Creatinine Clearance: 75.3 mL/min (by C-G formula based on SCr of 0.74 mg/dL).   Medical History: Past Medical History:  Diagnosis Date   Allergic rhinitis    Anemia     after gastric bypass in 2018   Anxiety    Barrett's esophagus    Bipolar affective disorder (HCC)    CAP (community acquired pneumonia) 07/20/2018   Chronic respiratory failure (HCC)    Constipation, chronic    Degenerative joint disease of spine    Depression    Diabetes  mellitus without complication (HCC)    type 2    DM (diabetes mellitus) (HCC)    hypoglycemic per pt - no longer takes DM meds due to weight loss from gastric bypass in 2018   Dry eye    Family history of adverse reaction to anesthesia    nausea and vomiting   Gastroparesis    GERD (gastroesophageal reflux disease)    H/O shoulder replacement 08/11/2020   left shoulder   History of bariatric surgery 03/2017   History of hiatal hernia    HLD (hyperlipidemia) 03/12/2013   Hyperlipidemia    Hypertension    No HTN meds since weight loss from Gastric Bypass in 2018   Intractable chronic migraine without aura 06/04/2015   Migraine headache    Morbid obesity (HCC)    OSA (obstructive sleep apnea)    No cpap since gastric surgery   Osteoarthritis    bilateral  knee   SBO (small bowel obstruction) (HCC) 03/19/2019   Sleep apnea    Unspecified hypothyroidism    Vitamin B 12 deficiency    Vitamin D  deficiency     Medications:  Scheduled:   sodium chloride    Intravenous Once   Chlorhexidine  Gluconate Cloth  6 each Topical Daily   feeding supplement (VITAL HIGH PROTEIN)  1,000 mL Per Tube Q24H   hydrocortisone  sod succinate (SOLU-CORTEF ) inj  100 mg Intravenous BID   insulin  aspart  0-9 Units Subcutaneous Q4H   mouth rinse  15 mL Mouth Rinse Q2H   pantoprazole  (PROTONIX ) IV  40 mg Intravenous Q12H   thiamine  (VITAMIN B1) injection  100 mg Intravenous Q24H   Infusions:   DOBUTamine  2.5 mcg/kg/min (11/04/24 0641)   fentaNYL  infusion INTRAVENOUS 125 mcg/hr (11/04/24 0641)   heparin  500 Units/hr (11/04/24 0641)   magnesium  sulfate bolus IVPB 4 g (11/04/24 0659)   micafungin  (MYCAMINE ) 100 mg in sodium chloride  0.9 % 100 mL IVPB Stopped (11/03/24 1106)   piperacillin -tazobactam (ZOSYN )  IV 12.5 mL/hr at 11/04/24 0641   potassium chloride  10 mEq (11/04/24 0654)   potassium PHOSPHATE  IVPB (in mmol)     propofol  (DIPRIVAN ) infusion Stopped (11/02/24 1901)   TPN ADULT (ION) 30 mL/hr at  11/04/24 9358    Assessment: 58 yo F presenting with E. Coli bacteremia 2/2 SBO with perforation. On 11/22 underwent closure of abdomen in OR and repair of left radial artery. Not on AC PTA. Pharmacy consulted for heparin  management.  Heparin  level today is < 0.1 as expected with low-dose fixed heparin  infusion. Per vascular surgery, plan to keep heparin  infusion at 500 units/hr and monitor platelets closely with significant thrombocytopenia this admission. Received 3 units of platelets 11/22. Hgb is low 7.3, platelets is up to 87. No s/sx of bleeding or infusion issues.   Goal of Therapy:  Heparin  level < 0.1 units/ml Monitor platelets by anticoagulation protocol: Yes   Plan:  Continue heparin  infusion at 500 units/hr with no titration Monitor daily heparin  level, CBC, and signs/symptoms of bleeding F/u daily with vascular surgery to determine if plan for titration  Thank you for allowing pharmacy to participate in this patient's care,  Suzen Sour, PharmD, BCCCP Clinical Pharmacist  Phone: [phone] 11/04/2024 7:26 AM  Please check AMION for all [facility] Pharmacy phone numbers After 10:00 PM, call Main Pharmacy [phone]

## 2024-11-04 NOTE — Consult Note (Signed)
 WOC consulted for NPWT midline line, orders update for Tu/Friday changes based on Dr. Luigi orders.  Notified bedside nursing and CCS PA  Parrish Bonn [facility], CNS, CWON-AP [phone]

## 2024-11-05 DIAGNOSIS — R6521 Severe sepsis with septic shock: Secondary | ICD-10-CM | POA: Diagnosis not present

## 2024-11-05 DIAGNOSIS — E43 Unspecified severe protein-calorie malnutrition: Secondary | ICD-10-CM | POA: Insufficient documentation

## 2024-11-05 DIAGNOSIS — K56609 Unspecified intestinal obstruction, unspecified as to partial versus complete obstruction: Secondary | ICD-10-CM | POA: Diagnosis not present

## 2024-11-05 DIAGNOSIS — R579 Shock, unspecified: Secondary | ICD-10-CM | POA: Diagnosis not present

## 2024-11-05 DIAGNOSIS — A419 Sepsis, unspecified organism: Secondary | ICD-10-CM | POA: Diagnosis not present

## 2024-11-05 LAB — MAGNESIUM
Magnesium: 2 mg/dL (ref 1.7–2.4)
Magnesium: 1.8 mg/dL (ref 1.7–2.4)

## 2024-11-05 LAB — GLUCOSE, CAPILLARY
Glucose-Capillary: 135 mg/dL — ABNORMAL HIGH (ref 70–99)
Glucose-Capillary: 260 mg/dL — ABNORMAL HIGH (ref 70–99)
Glucose-Capillary: 152 mg/dL — ABNORMAL HIGH (ref 70–99)
Glucose-Capillary: 138 mg/dL — ABNORMAL HIGH (ref 70–99)
Glucose-Capillary: 391 mg/dL — ABNORMAL HIGH (ref 70–99)
Glucose-Capillary: 109 mg/dL — ABNORMAL HIGH (ref 70–99)
Glucose-Capillary: 123 mg/dL — ABNORMAL HIGH (ref 70–99)

## 2024-11-05 LAB — PREPARE PLATELET PHERESIS: Unit division: 0

## 2024-11-05 LAB — CBC
HCT: 21.6 % — ABNORMAL LOW (ref 36.0–46.0)
Hemoglobin: 6.9 g/dL — CL (ref 12.0–15.0)
MCH: 28.4 pg (ref 26.0–34.0)
MCHC: 31.9 g/dL (ref 30.0–36.0)
MCV: 88.9 fL (ref 80.0–100.0)
Platelets: 135 K/uL — ABNORMAL LOW (ref 150–400)
RBC: 2.43 MIL/uL — ABNORMAL LOW (ref 3.87–5.11)
RDW: 15.1 % (ref 11.5–15.5)
WBC: 9.1 K/uL (ref 4.0–10.5)
nRBC: 0.2 % (ref 0.0–0.2)

## 2024-11-05 LAB — PREPARE RBC (CROSSMATCH)

## 2024-11-05 LAB — PHOSPHORUS
Phosphorus: 2.2 mg/dL — ABNORMAL LOW (ref 2.5–4.6)
Phosphorus: 3 mg/dL (ref 2.5–4.6)

## 2024-11-05 LAB — BPAM PLATELET PHERESIS
Blood Product Expiration Date: 202511252359
Unit Type and Rh: 7300

## 2024-11-05 LAB — HEPARIN LEVEL (UNFRACTIONATED)
Heparin Unfractionated: <0.1 [IU]/mL — ABNORMAL LOW (ref 0.30–0.70)
Heparin Unfractionated: <0.1 [IU]/mL — ABNORMAL LOW (ref 0.30–0.70)
Heparin Unfractionated: 0.13 [IU]/mL — ABNORMAL LOW (ref 0.30–0.70)

## 2024-11-05 LAB — BASIC METABOLIC PANEL WITH GFR
Anion gap: 7 (ref 5–15)
BUN: 23 mg/dL — ABNORMAL HIGH (ref 6–20)
CO2: 34 mmol/L — ABNORMAL HIGH (ref 22–32)
Calcium: 7 mg/dL — ABNORMAL LOW (ref 8.9–10.3)
Chloride: 92 mmol/L — ABNORMAL LOW (ref 98–111)
Creatinine, Ser: 0.56 mg/dL (ref 0.44–1.00)
GFR, Estimated: >60 mL/min (ref >60)
Glucose, Bld: 414 mg/dL — ABNORMAL HIGH (ref 70–99)
Potassium: 4 mmol/L (ref 3.5–5.1)
Sodium: 133 mmol/L — ABNORMAL LOW (ref 135–145)

## 2024-11-05 LAB — HEMOGLOBIN AND HEMATOCRIT, BLOOD
HCT: 24.7 % — ABNORMAL LOW (ref 36.0–46.0)
Hemoglobin: 7.9 g/dL — ABNORMAL LOW (ref 12.0–15.0)

## 2024-11-05 LAB — COOXEMETRY PANEL
Carboxyhemoglobin: 1.5 % (ref 0.5–1.5)
Methemoglobin: <0.7 % (ref 0.0–1.5)
O2 Saturation: 66.3 %
Total hemoglobin: 7.1 g/dL — ABNORMAL LOW (ref 12.0–16.0)

## 2024-11-05 LAB — CALCIUM, IONIZED: Calcium, Ionized, Serum: 4.4 mg/dL — ABNORMAL LOW (ref 4.5–5.6)

## 2024-11-05 MED ORDER — INSULIN ASPART 100 UNIT/ML IJ SOLN
0.0000 [IU] | INTRAMUSCULAR | Status: DC
  Administered 2024-11-05: 3 [IU] via SUBCUTANEOUS
  Administered 2024-11-05 (×2): 2 [IU] via SUBCUTANEOUS
  Administered 2024-11-06: 3 [IU] via SUBCUTANEOUS
  Administered 2024-11-06: 2 [IU] via SUBCUTANEOUS
  Administered 2024-11-07: 3 [IU] via SUBCUTANEOUS
  Administered 2024-11-07 – 2024-11-10 (×10): 2 [IU] via SUBCUTANEOUS
  Administered 2024-11-10: 12 [IU] via SUBCUTANEOUS
  Administered 2024-11-11: 2 [IU] via SUBCUTANEOUS
  Administered 2024-11-12 – 2024-11-13 (×6): 3 [IU] via SUBCUTANEOUS
  Filled 2024-11-05 (×2): qty 2
  Filled 2024-11-05 (×2): qty 3
  Filled 2024-11-05: qty 2
  Filled 2024-11-05 (×2): qty 3
  Filled 2024-11-05 (×3): qty 2
  Filled 2024-11-05: qty 3
  Filled 2024-11-05 (×4): qty 2
  Filled 2024-11-05: qty 3
  Filled 2024-11-05: qty 2
  Filled 2024-11-05: qty 3
  Filled 2024-11-05 (×4): qty 2
  Filled 2024-11-05 (×2): qty 3
  Filled 2024-11-05: qty 2

## 2024-11-05 MED ORDER — VITAL AF 1.2 CAL PO LIQD
1000.0000 mL | ORAL | Status: DC
  Administered 2024-11-05 (×2): 1000 mL

## 2024-11-05 MED ORDER — SODIUM CHLORIDE 0.9% IV SOLUTION: Freq: Once | INTRAVENOUS | Status: AC

## 2024-11-05 MED ORDER — ORAL CARE MOUTH RINSE
15.0000 mL | OROMUCOSAL | Status: DC | PRN
  Administered 2024-11-05: 15 mL via OROMUCOSAL

## 2024-11-05 MED ORDER — ORAL CARE MOUTH RINSE
15.0000 mL | OROMUCOSAL | Status: DC
  Administered 2024-11-05 – 2024-11-14 (×32): 15 mL via OROMUCOSAL

## 2024-11-05 MED ORDER — TRACE MINERALS CU-MN-SE-ZN 300-55-60-3000 MCG/ML IV SOLN
INTRAVENOUS | Status: DC
  Filled 2024-11-05: qty 345.6

## 2024-11-05 MED ORDER — OXYCODONE HCL 5 MG PO TABS
5.0000 mg | ORAL_TABLET | ORAL | Status: DC | PRN
  Administered 2024-11-05: 5 mg
  Administered 2024-11-05 – 2024-11-09 (×6): 10 mg
  Filled 2024-11-05: qty 2
  Filled 2024-11-05: qty 1
  Filled 2024-11-05 (×5): qty 2
  Filled 2024-11-05: qty 1

## 2024-11-05 MED ORDER — POTASSIUM PHOSPHATES 15 MMOLE/5ML IV SOLN
15.0000 mmol | Freq: Once | INTRAVENOUS | Status: AC
  Administered 2024-11-05: 15 mmol via INTRAVENOUS
  Filled 2024-11-05: qty 5

## 2024-11-05 MED ORDER — MAGNESIUM SULFATE 2 GM/50ML IV SOLN
2.0000 g | Freq: Once | INTRAVENOUS | Status: AC
  Administered 2024-11-05: 2 g via INTRAVENOUS
  Filled 2024-11-05: qty 50

## 2024-11-05 NOTE — Consult Note (Signed)
 Orthopedic Hand Surgery Consultation:  Reason for Consult: Left hand ischemia Referring Physician: Dr. Toribio Sharps   HPI: Morgan [name] is a(an) 58 y.o. female admitted to the ICU with septic shock, acute abdomen requiring significant pressors who underwent radial artery embolectomy with vascular surgery Hand surgery consulted. Patient endorses pain in the hand. She denies numbness or tingling.   Physical Exam: Left wrist/hand Kerlix and xeroform removed and redressed Thumb is necrotic distal to MP joint, proximal to MP joint and in the 1st webspace is erythematous and indurated She is able to extend the MP joints fully Lacks terminal extension of the index, middle and ring PIPs FPL, FDS/FDP to all digits intact  Motor intact M/R/AIN/PIN, weak ulnar nerve Brisk cap refill to the tips of the index, middle, ring and small fingers Palpable radial pulse   Assessment/Plan: S/p left radial artery embolectomy. Patient has been maximally revascularized. Left thumb necrotic distal to MP joint, allow this to declare then plan for amputation. Encouraged elevation and finger range of motion to index-small fingers.  Heather Quale PA-C Orthopedic Hand Surgery [facility] Office number: [phone] [address], [address] [place], [place] [address]    Past Medical History:  Diagnosis Date   Allergic rhinitis    Anemia     after gastric bypass in 2018   Anxiety    Barrett's esophagus    Bipolar affective disorder (HCC)    CAP (community acquired pneumonia) 07/20/2018   Chronic respiratory failure (HCC)    Constipation, chronic    Degenerative joint disease of spine    Depression    Diabetes mellitus without complication (HCC)    type 2    DM (diabetes mellitus) (HCC)    hypoglycemic per pt - no longer takes DM meds due to weight loss from gastric bypass in 2018   Dry eye    Family history of adverse reaction to anesthesia    nausea and vomiting   Gastroparesis    GERD  (gastroesophageal reflux disease)    H/O shoulder replacement 08/11/2020   left shoulder   History of bariatric surgery 03/2017   History of hiatal hernia    HLD (hyperlipidemia) 03/12/2013   Hyperlipidemia    Hypertension    No HTN meds since weight loss from Gastric Bypass in 2018   Intractable chronic migraine without aura 06/04/2015   Migraine headache    Morbid obesity (HCC)    OSA (obstructive sleep apnea)    No cpap since gastric surgery   Osteoarthritis    bilateral knee   SBO (small bowel obstruction) (HCC) 03/19/2019   Sleep apnea    Unspecified hypothyroidism    Vitamin B 12 deficiency    Vitamin D  deficiency     Past Surgical History:  Procedure Laterality Date   ANKLE ARTHROSCOPY WITH RECONSTRUCTION Right 09/24/2019   Procedure: ANKLE ARTHROSCOPY DEBRIDEMENT TREATMENT OF OSTEOCHONDRAL LESION TALUS. PRONEAL TENDON DEBRIDEMENT;  Surgeon: Elsa Lonni JONELLE, MD;  Location: [facility];  Service: Orthopedics;  Laterality: Right;  SURGERY REQUEST TIME 2 HOURS   APPENDECTOMY  1978   APPLICATION OF WOUND VAC N/A 11/02/2024   Procedure: APPLICATION, WOUND VAC;  Surgeon: Tanda Locus, MD;  Location: [facility] OR;  Service: General;  Laterality: N/A;   CHOLECYSTECTOMY  2005   COLONOSCOPY     disc repair  05/17/2019   with rods, lumbar spine   GASTRIC ROUX-EN-Y N/A 03/21/2017   Procedure: LAPAROSCOPIC ROUX-EN-Y GASTRIC, UPPER ENDO;  Surgeon: Locus Tanda, MD;  Location: [facility];  Service: General;  Laterality: N/A;   GASTROSTOMY N/A 06/19/2018   Procedure: LAPRASCOPIC INSERTION OF GASTROSTOMY TUBE;  Surgeon: Tanda Locus, MD;  Location: [facility];  Service: General;  Laterality: N/A;   GASTROSTOMY TUBE PLACEMENT Left    06/2018   HIATAL HERNIA REPAIR N/A 06/19/2018   Procedure: LAPAROSCOPIC REPAIR OF HIATAL HERNIA;  Surgeon: Tanda Locus, MD;  Location: [facility];  Service: General;  Laterality: N/A;   LAPAROSCOPIC LYSIS OF ADHESIONS  03/19/2019   Dr. Mitzie Freund    LAPAROSCOPY N/A 06/19/2018   Procedure: LAPAROSCOPY DIAGNOSTIC;  Surgeon: Tanda Locus, MD;  Location: [facility];  Service: General;  Laterality: N/A;   LAPAROSCOPY N/A 03/19/2019   Procedure: LAPAROSCOPY DIAGNOSTIC lysis of adhesions;  Surgeon: Freund Mitzie LABOR, MD;  Location: [facility];  Service: General;  Laterality: N/A;   LAPAROTOMY N/A 10/29/2024   Procedure: LAPAROTOMY, EXPLORATORY, PLACEMENT OF G-TUBE, WOUND VAC;  Surgeon: Vernetta Berg, MD;  Location: [facility];  Service: General;  Laterality: N/A;   LAPAROTOMY N/A 10/31/2024   Procedure: REEXPLORATION OF ABDOMEN AND CLOSURE OF MESSENTARY DEFECT AND PLACEMENT OF THERA-VAC;  Surgeon: Tanda Locus, MD;  Location: [facility];  Service: General;  Laterality: N/A;  RE-EXPLORATION OF ABDOMEN   LAPAROTOMY N/A 11/02/2024   Procedure: LAPAROTOMY, RE-EXPLORATION;  Surgeon: Tanda Locus, MD;  Location: [facility] OR;  Service: General;  Laterality: N/A;  RE- EX LAP W/ POSSIBLE ABDOMINAL CLOSURE   LUMBAR LAMINECTOMY/DECOMPRESSION MICRODISCECTOMY Right 10/11/2018   Procedure: LAMINECTOMY AND FORAMINOTOMY RIGHT LUMBAR FOUR- LUMBAR FIVE;  Surgeon: Lanis Pupa, MD;  Location: [facility];  Service: Neurosurgery;  Laterality: Right;   MASS EXCISION Right 09/23/2021   Procedure: EXCISION MASS VOLAR ASPECT RIGHT HAND;  Surgeon: Murrell Drivers, MD;  Location: [facility];  Service: Orthopedics;  Laterality: Right;  45 MIN   SHOULDER ARTHROSCOPY  06/2011   left-dsc   THROMBECTOMY BRACHIAL ARTERY Left 11/02/2024   Procedure: THROMBECTOMY, ARTERY, RADIAL;  Surgeon: Lanis Fonda BRAVO, MD;  Location: [facility] OR;  Service: Vascular;  Laterality: Left;   TONSILLECTOMY  at age 25   TOTAL SHOULDER ARTHROPLASTY Left 08/11/2020   Procedure: TOTAL SHOULDER ARTHROPLASTY;  Surgeon: Josefina Fonda, MD;  Location: [facility];  Service: Orthopedics;  Laterality: Left;   TRIGGER FINGER RELEASE  12/20/2012   Procedure: RELEASE TRIGGER FINGER/A-1 PULLEY;  Surgeon: Drivers JONELLE Murrell, MD;  Location: [facility];  Service: Orthopedics;  Laterality: Left;  LEFT TRIGGER THUMB RELEASE   TRIGGER FINGER RELEASE Right 09/23/2021   Procedure: RELEASE A-1 PULLEY RIGHT INDEX FINGER;  Surgeon: Murrell Drivers, MD;  Location: [facility];  Service: Orthopedics;  Laterality: Right;   WOUND DEBRIDEMENT N/A 11/02/2024   Procedure: IRRIGATION AND DEBRIDEMENT, WOUND, ABDOMEN, WITH CLOSURE;  Surgeon: Tanda Locus, MD;  Location: [facility] OR;  Service: General;  Laterality: N/A;    Family History  Problem Relation Age of Onset   Asthma Father    Allergies Father    Heart disease Father        enlarged heart   Peripheral vascular disease Father    Diabetes Father    Hyperlipidemia Father    Arthritis Father    Asthma Sister    Cancer Sister        colon at 96 yr old.   Colon cancer Sister    Allergies Mother    Stroke Mother 81       with hemi paralysis   Diabetes Mother  Hyperlipidemia Mother    Hypertension Mother    GI problems Mother    Arthritis Mother    Allergies Brother    Early death Brother 87       congenital abormality   Allergies Sister    Diabetes Sister    Asthma Sister    Colon polyps Sister    Hyperlipidemia Sister    GI problems Sister        gastroporesis    Liver disease Sister        fatty liver   Stroke Sister        intercrandial bleed   Diabetes Brother    Hypertension Brother    Hyperlipidemia Brother    Heart disease Paternal Aunt    Migraines Neg Hx     Social History:  reports that she has never smoked. She has been exposed to tobacco smoke. She has never used smokeless tobacco. She reports that she does not drink alcohol  and does not use drugs.  Allergies:  Allergies  Allergen Reactions   Ketoconazole Hives, Swelling and Other (See Comments)    SWELLING REACTION UNSPECIFIED    Pravachol  [Pravastatin  Sodium] Shortness Of Breath, Swelling and Anaphylaxis    Throat swelling   Statins Other (See Comments)     Leg cramps   Tape Dermatitis and Other (See Comments)    Steri-Strips   Repatha  [Evolocumab ] Other (See Comments)    Made the stomach hurt badly.   Codeine Other (See Comments) and Hypertension    Increased B/P    Medications: reviewed, no changes to patient's home medications  Results for orders placed or performed during the hospital encounter of 10/29/24 (from the past 48 hours)  Glucose, capillary     Status: Abnormal   Collection Time: 11/03/24  3:56 PM  Result Value Ref Range   Glucose-Capillary 113 (H) 70 - 99 mg/dL    Comment: Glucose reference range applies only to samples taken after fasting for at least 8 hours.  Glucose, capillary     Status: Abnormal   Collection Time: 11/03/24  7:24 PM  Result Value Ref Range   Glucose-Capillary 134 (H) 70 - 99 mg/dL    Comment: Glucose reference range applies only to samples taken after fasting for at least 8 hours.  Basic metabolic panel     Status: Abnormal   Collection Time: 11/03/24  7:35 PM  Result Value Ref Range   Sodium 135 135 - 145 mmol/L   Potassium 4.0 3.5 - 5.1 mmol/L   Chloride 96 (L) 98 - 111 mmol/L   CO2 27 22 - 32 mmol/L   Glucose, Bld 180 (H) 70 - 99 mg/dL    Comment: Glucose reference range applies only to samples taken after fasting for at least 8 hours.   BUN 36 (H) 6 - 20 mg/dL   Creatinine, Ser 9.04 0.44 - 1.00 mg/dL   Calcium  7.2 (L) 8.9 - 10.3 mg/dL   GFR, Estimated >39 >39 mL/min    Comment: (NOTE) Calculated using the CKD-EPI Creatinine Equation (2021)    Anion gap 12 5 - 15    Comment: Performed at [facility], [address], [place], [place] [address]  Glucose, capillary     Status: Abnormal   Collection Time: 11/03/24 11:06 PM  Result Value Ref Range   Glucose-Capillary 165 (H) 70 - 99 mg/dL    Comment: Glucose reference range applies only to samples taken after fasting for at least 8 hours.  Glucose, capillary  Status: Abnormal   Collection Time: 11/04/24  3:30 AM  Result  Value Ref Range   Glucose-Capillary 201 (H) 70 - 99 mg/dL    Comment: Glucose reference range applies only to samples taken after fasting for at least 8 hours.  Cooxemetry Panel (carboxy, met, total hgb, O2 sat)     Status: Abnormal   Collection Time: 11/04/24  4:59 AM  Result Value Ref Range   Total hemoglobin 7.5 (L) 12.0 - 16.0 g/dL   O2 Saturation 76 %   Carboxyhemoglobin 2.1 (H) 0.5 - 1.5 %   Methemoglobin <0.7 0.0 - 1.5 %    Comment: Performed at [facility], [address], [place], [place] [address]  Heparin  level (unfractionated)     Status: Abnormal   Collection Time: 11/04/24  5:00 AM  Result Value Ref Range   Heparin  Unfractionated <0.10 (L) 0.30 - 0.70 IU/mL    Comment: (NOTE) The clinical reportable range upper limit is being lowered to >1.10 to align with the FDA approved guidance for the current laboratory assay.  If heparin  results are below expected values, and patient dosage has  been confirmed, suggest follow up testing of antithrombin III levels. Performed at [facility], [address], [place], [place] [address]   CBC     Status: Abnormal   Collection Time: 11/04/24  5:00 AM  Result Value Ref Range   WBC 10.4 4.0 - 10.5 K/uL   RBC 2.54 (L) 3.87 - 5.11 MIL/uL   Hemoglobin 7.3 (L) 12.0 - 15.0 g/dL   HCT 77.6 (L) 63.9 - 53.9 %   MCV 87.8 80.0 - 100.0 fL   MCH 28.7 26.0 - 34.0 pg   MCHC 32.7 30.0 - 36.0 g/dL   RDW 85.0 88.4 - 84.4 %   Platelets 87 (L) 150 - 400 K/uL    Comment: REPEATED TO VERIFY SPECIMEN CHECKED FOR CLOTS Immature Platelet Fraction may be clinically indicated, consider ordering this additional test [id number]    nRBC 0.0 0.0 - 0.2 %    Comment: Performed at [facility], [address], [place], [place] [address]  Comprehensive metabolic panel     Status: Abnormal   Collection Time: 11/04/24  5:00 AM  Result Value Ref Range   Sodium 136 135 - 145 mmol/L   Potassium 2.9 (L) 3.5 - 5.1 mmol/L   Chloride 94 (L) 98 -  111 mmol/L   CO2 34 (H) 22 - 32 mmol/L   Glucose, Bld 188 (H) 70 - 99 mg/dL    Comment: Glucose reference range applies only to samples taken after fasting for at least 8 hours.   BUN 31 (H) 6 - 20 mg/dL   Creatinine, Ser 9.25 0.44 - 1.00 mg/dL   Calcium  7.2 (L) 8.9 - 10.3 mg/dL   Total Protein 4.1 (L) 6.5 - 8.1 g/dL   Albumin  <1.5 (L) 3.5 - 5.0 g/dL   AST 88 (H) 15 - 41 U/L   ALT 64 (H) 0 - 44 U/L   Alkaline Phosphatase 48 38 - 126 U/L   Total Bilirubin 0.8 0.0 - 1.2 mg/dL   GFR, Estimated >39 >39 mL/min    Comment: (NOTE) Calculated using the CKD-EPI Creatinine Equation (2021)    Anion gap 8 5 - 15    Comment: Performed at [facility] Lab, [address], [place], [place] [address]  Magnesium      Status: None   Collection Time: 11/04/24  5:00 AM  Result  Value Ref Range   Magnesium  1.7 1.7 - 2.4 mg/dL    Comment: Performed at [facility], [address], [place], [place] [address]  Phosphorus     Status: None   Collection Time: 11/04/24  5:00 AM  Result Value Ref Range   Phosphorus 2.6 2.5 - 4.6 mg/dL    Comment: Performed at [facility], [address], [place], [place] [address]  Triglycerides     Status: Abnormal   Collection Time: 11/04/24  5:00 AM  Result Value Ref Range   Triglycerides 157 (H) <150 mg/dL    Comment: Performed at [facility], [address] [place], [place] [address]  Glucose, capillary     Status: Abnormal   Collection Time: 11/04/24  7:28 AM  Result Value Ref Range   Glucose-Capillary 182 (H) 70 - 99 mg/dL    Comment: Glucose reference range applies only to samples taken after fasting for at least 8 hours.  Glucose, capillary     Status: Abnormal   Collection Time: 11/04/24 11:39 AM  Result Value Ref Range   Glucose-Capillary 230 (H) 70 - 99 mg/dL    Comment: Glucose reference range applies only to samples taken after fasting for at least 8 hours.  Magnesium      Status: None   Collection Time: 11/04/24  5:29 PM  Result  Value Ref Range   Magnesium  2.3 1.7 - 2.4 mg/dL    Comment: Performed at [facility], [address], [place], [place] [address]  Phosphorus     Status: None   Collection Time: 11/04/24  5:29 PM  Result Value Ref Range   Phosphorus 2.8 2.5 - 4.6 mg/dL    Comment: Performed at [facility], [address], [place], [place] [address]  Heparin  level (unfractionated)     Status: Abnormal   Collection Time: 11/04/24  5:29 PM  Result Value Ref Range   Heparin  Unfractionated <0.10 (L) 0.30 - 0.70 IU/mL    Comment: (NOTE) The clinical reportable range upper limit is being lowered to >1.10 to align with the FDA approved guidance for the current laboratory assay.  If heparin  results are below expected values, and patient dosage has  been confirmed, suggest follow up testing of antithrombin III levels. Performed at [facility], [address] [place], [place] [address]   Basic metabolic panel     Status: Abnormal   Collection Time: 11/04/24  5:29 PM  Result Value Ref Range   Sodium 135 135 - 145 mmol/L   Potassium 4.3 3.5 - 5.1 mmol/L   Chloride 90 (L) 98 - 111 mmol/L   CO2 35 (H) 22 - 32 mmol/L   Glucose, Bld 524 (HH) 70 - 99 mg/dL    Comment: CRITICAL RESULT CALLED TO, READ BACK BY AND VERIFIED WITH L. BRADISH, RN AT 1821 11.24.25 D. BLU Glucose reference range applies only to samples taken after fasting for at least 8 hours.    BUN 27 (H) 6 - 20 mg/dL   Creatinine, Ser 9.39 0.44 - 1.00 mg/dL   Calcium  7.1 (L) 8.9 - 10.3 mg/dL   GFR, Estimated >39 >39 mL/min    Comment: (NOTE) Calculated using the CKD-EPI Creatinine Equation (2021)    Anion gap 10 5 - 15    Comment: Performed at [facility], [address] [place], [place] [address]  Glucose, capillary     Status: Abnormal   Collection Time: 11/04/24  5:35 PM  Result Value Ref Range   Glucose-Capillary 257 (H) 70 - 99 mg/dL    Comment: Glucose reference range applies only to samples taken after  fasting for at least 8 hours.   Comment 1 Notify RN    Comment 2 Document in Chart   Glucose, capillary     Status: Abnormal   Collection Time: 11/04/24  7:20 PM  Result Value Ref Range   Glucose-Capillary 238 (H) 70 - 99 mg/dL    Comment: Glucose reference range applies only to samples taken after fasting for at least 8 hours.  Glucose, capillary     Status: Abnormal   Collection Time: 11/04/24 11:15 PM  Result Value Ref Range   Glucose-Capillary 182 (H) 70 - 99 mg/dL    Comment: Glucose reference range applies only to samples taken after fasting for at least 8 hours.  Heparin  level (unfractionated)     Status: Abnormal   Collection Time: 11/05/24 12:55 AM  Result Value Ref Range   Heparin  Unfractionated <0.10 (L) 0.30 - 0.70 IU/mL    Comment: (NOTE) The clinical reportable range upper limit is being lowered to >1.10 to align with the FDA approved guidance for the current laboratory assay.  If heparin  results are below expected values, and patient dosage has  been confirmed, suggest follow up testing of antithrombin III levels. Performed at [facility], [address] [place], [place] [address]   CBC     Status: Abnormal   Collection Time: 11/05/24  3:37 AM  Result Value Ref Range   WBC 9.1 4.0 - 10.5 K/uL   RBC 2.43 (L) 3.87 - 5.11 MIL/uL   Hemoglobin 6.9 (LL) 12.0 - 15.0 g/dL    Comment: REPEATED TO VERIFY This critical result has been called to K. Aboagye, RN by Miguel Reveal on 11/05/2024 04:07:20, and has been read back.    HCT 21.6 (L) 36.0 - 46.0 %   MCV 88.9 80.0 - 100.0 fL   MCH 28.4 26.0 - 34.0 pg   MCHC 31.9 30.0 - 36.0 g/dL   RDW 84.8 88.4 - 84.4 %   Platelets 135 (L) 150 - 400 K/uL   nRBC 0.2 0.0 - 0.2 %    Comment: Performed at [facility], [address], [place], [place] [address]  Magnesium      Status: None   Collection Time: 11/05/24  3:37 AM  Result Value Ref Range   Magnesium  2.0 1.7 - 2.4 mg/dL    Comment: Performed at [facility], [address] [place], [place] [address]  Phosphorus     Status: Abnormal   Collection Time: 11/05/24  3:37 AM  Result Value Ref Range   Phosphorus 2.2 (L) 2.5 - 4.6 mg/dL    Comment: Performed at [facility], [address] [place], [place] [address]  Cooxemetry Panel (carboxy, met, total hgb, O2 sat)     Status: Abnormal   Collection Time: 11/05/24  3:37 AM  Result Value Ref Range   Total hemoglobin 7.1 (L) 12.0 - 16.0 g/dL   O2 Saturation 33.6 %   Carboxyhemoglobin 1.5 0.5 - 1.5 %   Methemoglobin <0.7 0.0 - 1.5 %    Comment: Performed at [facility], [address], [place], [place] [address]  Glucose, capillary     Status: Abnormal   Collection Time: 11/05/24  3:46 AM  Result Value Ref Range   Glucose-Capillary 135 (H) 70 - 99 mg/dL  Comment: Glucose reference range applies only to samples taken after fasting for at least 8 hours.  Prepare RBC (crossmatch)     Status: None   Collection Time: 11/05/24  4:55 AM  Result Value Ref Range   Order Confirmation      ORDER PROCESSED BY BLOOD BANK Performed at [facility], [address] [place] [address] [place], [place] [address]   Heparin  level (unfractionated)     Status: Abnormal   Collection Time: 11/05/24  8:14 AM  Result Value Ref Range   Heparin  Unfractionated <0.10 (L) 0.30 - 0.70 IU/mL    Comment: (NOTE) The clinical reportable range upper limit is being lowered to >1.10 to align with the FDA approved guidance for the current laboratory assay.  If heparin  results are below expected values, and patient dosage has  been confirmed, suggest follow up testing of antithrombin III levels. Performed at [facility], [address] [place], [place] [address]   Glucose, capillary     Status: Abnormal   Collection Time: 11/05/24  8:17 AM  Result Value Ref Range   Glucose-Capillary 260 (H) 70 - 99 mg/dL    Comment: Glucose reference range applies only to samples taken after fasting for at least 8 hours.   Hemoglobin and hematocrit, blood     Status: Abnormal   Collection Time: 11/05/24 10:55 AM  Result Value Ref Range   Hemoglobin 7.9 (L) 12.0 - 15.0 g/dL   HCT 75.2 (L) 63.9 - 53.9 %    Comment: Performed at [facility], [address], [place], [place] [address]  Basic metabolic panel     Status: Abnormal   Collection Time: 11/05/24 10:55 AM  Result Value Ref Range   Sodium 133 (L) 135 - 145 mmol/L   Potassium 4.0 3.5 - 5.1 mmol/L   Chloride 92 (L) 98 - 111 mmol/L   CO2 34 (H) 22 - 32 mmol/L   Glucose, Bld 414 (H) 70 - 99 mg/dL    Comment: Glucose reference range applies only to samples taken after fasting for at least 8 hours.   BUN 23 (H) 6 - 20 mg/dL   Creatinine, Ser 9.43 0.44 - 1.00 mg/dL   Calcium  7.0 (L) 8.9 - 10.3 mg/dL   GFR, Estimated >39 >39 mL/min    Comment: (NOTE) Calculated using the CKD-EPI Creatinine Equation (2021)    Anion gap 7 5 - 15    Comment: Performed at [facility], [address] [place], [place] [address]  Glucose, capillary     Status: Abnormal   Collection Time: 11/05/24 11:22 AM  Result Value Ref Range   Glucose-Capillary 152 (H) 70 - 99 mg/dL    Comment: Glucose reference range applies only to samples taken after fasting for at least 8 hours.    No results found.  ROS: 14 point review of systems negative except per HPI

## 2024-11-05 NOTE — Progress Notes (Signed)
 PHARMACY - ANTICOAGULATION CONSULT NOTE  Pharmacy Consult for heparin  infusion Indication: postop vascular procedure  Allergies  Allergen Reactions   Ketoconazole Hives, Swelling and Other (See Comments)    SWELLING REACTION UNSPECIFIED    Pravachol  [Pravastatin  Sodium] Shortness Of Breath, Swelling and Anaphylaxis    Throat swelling   Statins Other (See Comments)    Leg cramps   Tape Dermatitis and Other (See Comments)    Steri-Strips   Repatha  [Evolocumab ] Other (See Comments)    Made the stomach hurt badly.   Codeine Other (See Comments) and Hypertension    Increased B/P    Patient Measurements: Height: 5' 4 (162.6 cm) Weight: 73 kg (160 lb 15 oz) IBW/kg (Calculated) : 54.7 HEPARIN  DW (KG): 59.9  Vital Signs: Temp: 97.4 F (36.3 C) (11/25 1515) Temp Source: Axillary (11/25 1515) BP: 100/59 (11/25 1630) Pulse Rate: 63 (11/25 1630)  Labs: Recent Labs    11/03/24 0353 11/03/24 1935 11/04/24 0500 11/04/24 1729 11/05/24 0055 11/05/24 0337 11/05/24 0814 11/05/24 1055 11/05/24 1526  HGB 9.2*  --  7.3*  --   --  6.9*  --  7.9*  --   HCT 27.5*  --  22.3*  --   --  21.6*  --  24.7*  --   PLT 64*  --  87*  --   --  135*  --   --   --   HEPARINUNFRC <0.10*  --  <0.10* <0.10* <0.10*  --  <0.10*  --  0.13*  CREATININE 1.20*   < > 0.74 0.60  --   --   --  0.56  --    < > = values in this interval not displayed.    Estimated Creatinine Clearance: 75.9 mL/min (by C-G formula based on SCr of 0.56 mg/dL).   Assessment: 58 yo F presenting with E. Coli bacteremia 2/2 SBO with perforation. On 11/22 underwent closure of abdomen in OR and repair of left radial artery. Not on AC PTA. Pharmacy consulted for heparin  management.  Received 3 units of platelets 11/22.  Heparin  level 0.13, heparin  infusing.- subtherapeutic  Goal of Therapy:  Heparin  level 0.3-0.5 units/ml Monitor platelets by anticoagulation protocol: Yes   Plan:  Increase heparin  1150 units/h for  now Repeat heparin  level in 6h  Sharyne Glatter, PharmD, BCCCP Critical Care Clinical Pharmacist 11/05/2024 4:37 PM

## 2024-11-05 NOTE — Progress Notes (Signed)
 Progress Note  3 Days Post-Op  Subjective: Pt denies nausea with trickle TF. Seen with WOC RN for [facility] change today   Objective: Vital signs in last 24 hours: Temp:  [97.9 F (36.6 C)-99.6 F (37.6 C)] 98.1 F (36.7 C) (11/25 0800) Pulse Rate:  [62-82] 62 (11/25 1030) Resp:  [6-18] 10 (11/25 1030) BP: (91-124)/(55-103) 95/63 (11/25 1030) SpO2:  [91 %-100 %] 97 % (11/25 1030) Weight:  [73 kg] 73 kg (11/25 0700) Last BM Date :  (PTA)  Intake/Output from previous day: 11/24 0701 - 11/25 0700 In: 2301.4 [I.V.:927.2; NG/GT:386.3; IV Piggyback:987.9] Out: 2900 [Urine:2875; Drains:25] Intake/Output this shift: Total I/O In: 595.2 [I.V.:141.4; Blood:315; NG/GT:74.3; IV Piggyback:64.5] Out: 215 [Urine:215]  PE: General: pleasant, WD female who is laying in bed in NAD HEENT: sclera anicteric Heart: regular, rate, and rhythm.   Lungs: Respiratory effort nonlabored Abd: soft, appropriately ttp, G-tube present with sutures in place, midline wound as pictured below, ND     Lab Results:  Recent Labs    11/04/24 0500 11/05/24 0337  WBC 10.4 9.1  HGB 7.3* 6.9*  HCT 22.3* 21.6*  PLT 87* 135*   BMET Recent Labs    11/04/24 0500 11/04/24 1729  NA 136 135  K 2.9* 4.3  CL 94* 90*  CO2 34* 35*  GLUCOSE 188* 524*  BUN 31* 27*  CREATININE 0.74 0.60  CALCIUM  7.2* 7.1*   PT/INR No results for input(s): LABPROT, INR in the last 72 hours. CMP     Component Value Date/Time   NA 135 11/04/2024 1729   NA 142 09/23/2024 1030   K 4.3 11/04/2024 1729   CL 90 (L) 11/04/2024 1729   CO2 35 (H) 11/04/2024 1729   GLUCOSE 524 (HH) 11/04/2024 1729   BUN 27 (H) 11/04/2024 1729   BUN 17 09/23/2024 1030   CREATININE 0.60 11/04/2024 1729   CREATININE 0.82 06/18/2013 1009   CALCIUM  7.1 (L) 11/04/2024 1729   PROT 4.1 (L) 11/04/2024 0500   PROT 6.5 06/28/2024 0842   ALBUMIN  <1.5 (L) 11/04/2024 0500   ALBUMIN  4.5 06/28/2024 0842   AST 88 (H) 11/04/2024 0500   ALT 64 (H)  11/04/2024 0500   ALKPHOS 48 11/04/2024 0500   BILITOT 0.8 11/04/2024 0500   BILITOT 0.3 06/28/2024 0842   GFRNONAA >60 11/04/2024 1729   GFRNONAA 87 06/18/2013 1009   GFRAA >60 08/04/2020 1038   GFRAA >89 06/18/2013 1009   Lipase     Component Value Date/Time   LIPASE 22 11/01/2024 0450       Studies/Results: No results found.  Anti-infectives: Anti-infectives (From admission, onward)    Start     Dose/Rate Route Frequency Ordered Stop   10/31/24 1400  piperacillin -tazobactam (ZOSYN ) IVPB 3.375 g        3.375 g 12.5 mL/hr over 240 Minutes Intravenous Every 8 hours 10/31/24 0751 11/07/24 1359   10/31/24 1100  micafungin  (MYCAMINE ) 100 mg in sodium chloride  0.9 % 100 mL IVPB        100 mg 105 mL/hr over 1 Hours Intravenous Every 24 hours 10/31/24 0901 11/04/24 1354   10/30/24 0600  piperacillin -tazobactam (ZOSYN ) IVPB 3.375 g  Status:  Discontinued        3.375 g 12.5 mL/hr over 240 Minutes Intravenous Every 8 hours 10/29/24 2350 10/31/24 0751   10/29/24 2145  piperacillin -tazobactam (ZOSYN ) IVPB 3.375 g        3.375 g 100 mL/hr over 30 Minutes Intravenous  Once 10/29/24 2137 10/29/24  2205   10/29/24 1700  cefTRIAXone  (ROCEPHIN ) 1 g in sodium chloride  0.9 % 100 mL IVPB  Status:  Discontinued        1 g 200 mL/hr over 30 Minutes Intravenous  Once 10/29/24 1646 10/29/24 1656   10/29/24 1700  metroNIDAZOLE  (FLAGYL ) IVPB 500 mg        500 mg 100 mL/hr over 60 Minutes Intravenous  Once 10/29/24 1646 10/29/24 1809   10/29/24 1700  cefTRIAXone  (ROCEPHIN ) 2 g in sodium chloride  0.9 % 100 mL IVPB        2 g 200 mL/hr over 30 Minutes Intravenous  Once 10/29/24 1656 10/29/24 1733        Assessment/Plan POD 7, s/p ex lap with LOA, g-tube placement in gastric remnant Dr. Vernetta 11/18 secondary to SBO with possible perforation POD 5, s/p ex lap with washout, Dr. Tanda 11/20 POD 3, s/p washout, abdominal closure Dr. Tanda 11/23 - cont zosyn , BC 2/4 positive for E coli.   Antifungal coverage added 11/20, on stress dose steroids - IV thiamine  for 3 days, completed - ok to remove foley from surgical standpoint  - plts up to 135K this AM - tol trickle TF, ok to advance - SLP eval  - continue VAC T/F - hgb 6.9 this AM but no signs of active bleeding, CCM ordered 1 PRBC   FEN - NPO/IVFs per CCM, trickle TF VTE - heparin  gtt ID - micafungin  5 doses completed; Zosyn  11/20>>   - per CCM -  E coli bacteremia - continue zosyn  Septic shock - off pressors, improving  CAD/Heart failure - EF 20-25% on echo 11/21 AKI - resolved DM - SSI Leukopenia - resolved, secondary to sepsis Thrombocytopenia - improving Hypocalcemia - per CCM Neck wound - unclear etiology, have asked nursing to place mepilex over this to protect it.  Avoid dressings or lines in this area. L hand ischemic changes -  VVS following, s/p embolectomy radial artery and repair, likely will need more surgery. Hand surgery consulted     LOS: 7 days   Burnard JONELLE Louder, [facility] 11/05/2024, 10:59 AM Please see Amion for pager number during day hours 7:00am-4:30pm

## 2024-11-05 NOTE — Progress Notes (Addendum)
 Advanced Heart Failure Rounding Note  Patient Profile:    58 y/o female admitted w/ SBO and septic shock d/t e.coli bacteremia, s/p exploratory laparotomy w/ lysis of adhesions and placement of gastrostomy tube. Post op course c/b new systolic heart failure w/ CS and acute radial artery occlusion/ischemic left hand requiring radial artery embolectomy.   Subjective:    11/22 To OR after ab wound closure and revasc of LUE   Extubated yesterday. Diuresed well, 2.9L in UOP. CVP 3.  SCr 0.74>>0.60. Co-ox 66% on DBA 1.  SBPs 90s-low 100s.   Hgb 6.9, Pts 87>>135.   Feels tried and weak. Denies dyspnea.   POCUS ECHO EF 30-35% with RWMA    Objective:   Weight Range:  Vital Signs:   Temp:  [97.9 F (36.6 C)-99.6 F (37.6 C)] 97.9 F (36.6 C) (11/25 0615) Pulse Rate:  [62-88] 69 (11/25 0700) Resp:  [6-27] 13 (11/25 0700) BP: (91-124)/(55-103) 102/60 (11/25 0700) SpO2:  [91 %-100 %] 95 % (11/25 0700) FiO2 (%):  [40 %] 40 % (11/24 0800) Last BM Date :  (PTA)  Weight change: Filed Weights   11/02/24 0030 11/03/24 0500 11/04/24 0500  Weight: 77.8 kg 80.1 kg 71.6 kg    Intake/Output:   Intake/Output Summary (Last 24 hours) at 11/05/2024 0748 Last data filed at 11/05/2024 0500 Gross per 24 hour  Intake 2178.27 ml  Output 2900 ml  Net -721.73 ml     Physical Exam:  CVP 3  GENERAL: fatigued, chronically ill appearing, NAD  Lungs- clear  CARDIAC:  JVP not elevated.          Normal rate with regular rhythm. No MRG. No LEE  ABDOMEN: Soft, non-tender, non-distended.  EXTREMITIES: Warm and well perfused.  NEUROLOGIC: No obvious FND   Telemetry: NSR 80s Personally reviewed  Labs: Basic Metabolic Panel: Recent Labs  Lab 11/02/24 0355 11/02/24 1631 11/03/24 0353 11/03/24 1935 11/04/24 0500 11/04/24 1729 11/05/24 0337  NA 129*  129* 132* 133* 135 136 135  --   K 3.6  3.6 3.7 3.8 4.0 2.9* 4.3  --   CL 88*  88* 90* 91* 96* 94* 90*  --   CO2 30  30 25 29 27   34* 35*  --   GLUCOSE 95  95 76 86 180* 188* 524*  --   BUN 34*  34* 35* 40* 36* 31* 27*  --   CREATININE 1.13*  1.13* 1.07* 1.20* 0.95 0.74 0.60  --   CALCIUM  6.9*  6.9* 7.2* 7.2* 7.2* 7.2* 7.1*  --   MG 2.1  2.1  --  2.2  --  1.7 2.3 2.0  PHOS 5.8*  5.8*  --  4.6  --  2.6 2.8 2.2*    Liver Function Tests: Recent Labs  Lab 10/30/24 1141 11/01/24 0450 11/01/24 0920 11/02/24 0355 11/03/24 0353 11/04/24 0500  AST 123* 124*  --  78*  78* 80* 88*  ALT 130* 103*  --  72*  72* 66* 64*  ALKPHOS 48 62  --  44  44 54 48  BILITOT <0.2 0.7 0.7 0.8  0.8 1.3* 0.8  PROT 3.4* 4.3*  --  3.8*  3.8* 4.2* 4.1*  ALBUMIN  2.2* 2.1*  --  <1.5*  <1.5* 1.6* <1.5*   Recent Labs  Lab 10/29/24 1541 10/30/24 0220 10/31/24 0446 11/01/24 0450  LIPASE >2,800* 882* 78* 22   No results for input(s): AMMONIA in the last 168 hours.  CBC: Recent Labs  Lab 10/29/24 1541 10/29/24 2131 10/31/24 0736 10/31/24 0931 11/02/24 0853 11/02/24 1631 11/03/24 0353 11/04/24 0500 11/05/24 0337  WBC 1.1*   < > 11.6*   < > 13.7* 12.9* 11.1* 10.4 9.1  NEUTROABS 1.0*  --  10.5*  --   --   --   --   --   --   HGB 13.0   < > 12.4   < > 9.8* 9.8* 9.2* 7.3* 6.9*  HCT 42.4   < > 38.0   < > 29.2* 29.5* 27.5* 22.3* 21.6*  MCV 94.4   < > 88.8   < > 85.6 86.0 85.1 87.8 88.9  PLT 362   < > 45*   < > 44* 84* 64* 87* 135*   < > = values in this interval not displayed.    Cardiac Enzymes: No results for input(s): CKTOTAL, CKMB, CKMBINDEX, TROPONINI in the last 168 hours.  BNP: BNP (last 3 results) No results for input(s): BNP in the last 8760 hours.  ProBNP (last 3 results) Recent Labs    11/01/24 0920  PROBNP >35,000.0*      Other results:  Imaging: No results found.    Medications:     Scheduled Medications:  sodium chloride    Intravenous Once   sodium chloride    Intravenous Once   acetaminophen   650 mg Per Tube Q6H   Chlorhexidine  Gluconate Cloth  6 each Topical Daily    insulin  aspart  0-9 Units Subcutaneous Q4H   levothyroxine   112 mcg Per Tube QAC breakfast   mouth rinse  15 mL Mouth Rinse Q2H   pantoprazole  (PROTONIX ) IV  40 mg Intravenous Q12H   sertraline   100 mg Per Tube Daily   spironolactone   12.5 mg Per Tube Daily   thiamine  (VITAMIN B1) injection  100 mg Intravenous Q24H   zinc  sulfate (50mg  elemental zinc )  220 mg Per Tube Daily    Infusions:  copper  chloride 2 mg in dextrose  5 % 100 mL IVPB Stopped (11/04/24 1755)   DOBUTamine  1 mcg/kg/min (11/05/24 0500)   feeding supplement (VITAL AF 1.2 CAL) 20 mL/hr at 11/05/24 0500   heparin  1,000 Units/hr (11/05/24 0500)   piperacillin -tazobactam (ZOSYN )  IV 3.375 g (11/05/24 0655)   TPN ADULT (ION) 30 mL/hr at 11/05/24 0500    PRN Medications: HYDROmorphone  (DILAUDID ) injection, oxyCODONE , sodium chloride  flush   Assessment:   1. Septic shock with e.coli bacteremia +/- component of cardiogenic shock New Cardiomyopathy -  - Patient was admitted with small bowel obstruction and septic shock. Patient underwent exploratory laparotomy exploratory laparotomy was found to have a densities at the end obstructing the proximal small bowel near Roux-en-Y anastomosis.   - Bcx + for e.coli - abx and stress-dose steroids per CCM - ab closure on 11/22 - Echo showed LVEF of 20-25% with multiple wall motion abnormalities  - POCUS ECHO 11/22 EF ~30-35% with RWMA concerning for underlying CAD - remains on DBA 1 for cardiac support, Co-ox 66%. Stop DBA  - CVP 3. Switch to PO Lasix  40 mg daily to keep net even w/ TPN - Continue spiro 12.5 mg daily  - BMP too soft for additional GDMT  - May need cath prior to d/c if EF not improving (pending improvement of thrombocytopenia and overall condition). Would do cath femorally  2. Acute systolic HF - plan as above.  - Stop DBA and follow co-ox  - Start PO Lasix  40 mg daily  - Continue Spiro 12.5 mg daily  -  check BMP   3. Acute hypoxic resp failure - extubated   - stable on 2L [place]   4. Non-Obstructive CAD - Coronary CTA in 09/2024 showed coronary calcium  score of 315 (90th percentile for age and sex) and mild mixed nonobstructive CAD with 1-24% stenosis of the proximal RCA and 25-49% stenosis of LAD. - No Aspirin  given severe thrombocytopenia.  - Intolerant to statins in the past.  - Echo concerning for new RWMA. Will need to consider cath prior to d/c   5. Ischemic Left Hand - VVS following. S/p revasc 11/22   6. Severe thrombocytopenia - PLTS 8k -> 64->87->135K - due to sepsis - supportive care  7. Hypokalemia/Hypomagnesemia - BMP pending, Mg improved 2.0 today   8. Anemia: -Hgb 6.9 -1u RBCs ordered     CRITICAL CARE Performed by: Caffie Shed   Total critical care time: 15 minutes  Critical care time was exclusive of separately billable procedures and treating other patients.  Critical care was necessary to treat or prevent imminent or life-threatening deterioration.  Critical care was time spent personally by me on the following activities: development of treatment plan with patient and/or surrogate as well as nursing, discussions with consultants, evaluation of patient's response to treatment, examination of patient, obtaining history from patient or surrogate, ordering and performing treatments and interventions, ordering and review of laboratory studies, ordering and review of radiographic studies, pulse oximetry and re-evaluation of patient's condition.   Length of Stay: 7   Brittainy Shed RIGGERS  11/05/2024, 7:48 AM  Advanced Heart Failure Team Pager [phone] (M-F; 7a - 4p)  Please contact [facility] for night-coverage after hours (4p -7a ) and weekends on [email or web address]    Agree with above.  Remains on DBA 1.  Co-ox 66% Extubated yesterday. Feels ok. Weak. On TPN Diuresed well with IV lasix    General:  Sitting up in bed. No resp difficulty HEENT: normal Neck: supple. no JVD.  Cor: Regular rate & rhythm.  No rubs, gallops or murmurs. Lungs: clear Abdomen: soft, nontender, nondistended.Good bowel sounds.+ wound vac Extremities: no cyanosis, clubbing, rash, edema  L thumb wrapped Neuro: alert & orientedx3, cranial nerves grossly intact. moves all 4 extremities w/o difficulty. Affect pleasant  Co-ox stable. Can stop DBA. Can start po diuretics as needed.   Would consider cardiac cath prior to d/c (from femoral approach) to assess for CAD given persistent RWMA one cho  Toribio Fuel, MD  1:06 PM

## 2024-11-05 NOTE — Progress Notes (Signed)
 Inpatient Rehab Admissions Coordinator:   Per therapy recommendations pt was screened for CIR by Reche Lowers, PT, DPT.  Note extubated yesterday and evaluations completed post extubation, limited to bed level eval.  Will see how she does in her next therapy session and re-screen for candidacy at that time.   Reche Lowers, PT, DPT Admissions Coordinator [phone] 11/05/24 9:31 AM

## 2024-11-05 NOTE — Inpatient Diabetes Management (Signed)
 Inpatient Diabetes Program Recommendations  AACE/ADA: New Consensus Statement on Inpatient Glycemic Control (2015)  Target Ranges:  Prepandial:   less than 140 mg/dL      Peak postprandial:   less than 180 mg/dL (1-2 hours)      Critically ill patients:  140 - 180 mg/dL   Lab Results  Component Value Date   GLUCAP 260 (H) 11/05/2024   HGBA1C 5.3 10/30/2024    Review of Glycemic Control  Latest Reference Range & Units 11/04/24 07:28 11/04/24 11:39 11/04/24 17:35 11/04/24 19:20 11/04/24 23:15 11/05/24 03:46 11/05/24 08:17  Glucose-Capillary 70 - 99 mg/dL 817 (H) 769 (H) 742 (H) 238 (H) 182 (H) 135 (H) 260 (H)  (H): Data is abnormally high  Diabetes history: DM2 Outpatient Diabetes medications: None Current orders for Inpatient glycemic control: Novolog  0-9 units Q4H  Inpatient Diabetes Program Recommendations:    May need some tube feed coverage as feeds increase and TPN weaned.    Thank you, Wyvonna Pinal, MSN, CDCES Diabetes Coordinator Inpatient Diabetes Program [phone] (team pager from 8a-5p)

## 2024-11-05 NOTE — Evaluation (Signed)
 Clinical/Bedside Swallow Evaluation Patient Details  Name: Morgan [name] MRN: [id number] Date of Birth: 1966/08/22  Today's Date: 11/05/2024 Time: SLP Start Time (ACUTE ONLY): 1254 SLP Stop Time (ACUTE ONLY): 1304 SLP Time Calculation (min) (ACUTE ONLY): 10 min  Past Medical History:  Past Medical History:  Diagnosis Date   Allergic rhinitis    Anemia     after gastric bypass in 2018   Anxiety    Barrett's esophagus    Bipolar affective disorder (HCC)    CAP (community acquired pneumonia) 07/20/2018   Chronic respiratory failure (HCC)    Constipation, chronic    Degenerative joint disease of spine    Depression    Diabetes mellitus without complication (HCC)    type 2    DM (diabetes mellitus) (HCC)    hypoglycemic per pt - no longer takes DM meds due to weight loss from gastric bypass in 2018   Dry eye    Family history of adverse reaction to anesthesia    nausea and vomiting   Gastroparesis    GERD (gastroesophageal reflux disease)    H/O shoulder replacement 08/11/2020   left shoulder   History of bariatric surgery 03/2017   History of hiatal hernia    HLD (hyperlipidemia) 03/12/2013   Hyperlipidemia    Hypertension    No HTN meds since weight loss from Gastric Bypass in 2018   Intractable chronic migraine without aura 06/04/2015   Migraine headache    Morbid obesity (HCC)    OSA (obstructive sleep apnea)    No cpap since gastric surgery   Osteoarthritis    bilateral knee   SBO (small bowel obstruction) (HCC) 03/19/2019   Sleep apnea    Unspecified hypothyroidism    Vitamin B 12 deficiency    Vitamin D  deficiency    Past Surgical History:  Past Surgical History:  Procedure Laterality Date   ANKLE ARTHROSCOPY WITH RECONSTRUCTION Right 09/24/2019   Procedure: ANKLE ARTHROSCOPY DEBRIDEMENT TREATMENT OF OSTEOCHONDRAL LESION TALUS. PRONEAL TENDON DEBRIDEMENT;  Surgeon: Elsa Lonni JONELLE, MD;  Location:  SURGERY CENTER;  Service: Orthopedics;   Laterality: Right;  SURGERY REQUEST TIME 2 HOURS   APPENDECTOMY  1978   APPLICATION OF WOUND VAC N/A 11/02/2024   Procedure: APPLICATION, WOUND VAC;  Surgeon: Tanda Locus, MD;  Location: [facility] OR;  Service: General;  Laterality: N/A;   CHOLECYSTECTOMY  2005   COLONOSCOPY     disc repair  05/17/2019   with rods, lumbar spine   GASTRIC ROUX-EN-Y N/A 03/21/2017   Procedure: LAPAROSCOPIC ROUX-EN-Y GASTRIC, UPPER ENDO;  Surgeon: Locus Tanda, MD;  Location: [facility];  Service: General;  Laterality: N/A;   GASTROSTOMY N/A 06/19/2018   Procedure: LAPRASCOPIC INSERTION OF GASTROSTOMY TUBE;  Surgeon: Tanda Locus, MD;  Location: [facility];  Service: General;  Laterality: N/A;   GASTROSTOMY TUBE PLACEMENT Left    06/2018   HIATAL HERNIA REPAIR N/A 06/19/2018   Procedure: LAPAROSCOPIC REPAIR OF HIATAL HERNIA;  Surgeon: Tanda Locus, MD;  Location: [facility];  Service: General;  Laterality: N/A;   LAPAROSCOPIC LYSIS OF ADHESIONS  03/19/2019   Dr. Mitzie Freund   LAPAROSCOPY N/A 06/19/2018   Procedure: LAPAROSCOPY DIAGNOSTIC;  Surgeon: Tanda Locus, MD;  Location: [facility];  Service: General;  Laterality: N/A;   LAPAROSCOPY N/A 03/19/2019   Procedure: LAPAROSCOPY DIAGNOSTIC lysis of adhesions;  Surgeon: Freund Mitzie LABOR, MD;  Location: [facility];  Service: General;  Laterality: N/A;   LAPAROTOMY N/A 10/29/2024   Procedure: LAPAROTOMY, EXPLORATORY, PLACEMENT  OF G-TUBE, WOUND VAC;  Surgeon: Vernetta Berg, MD;  Location: [facility];  Service: General;  Laterality: N/A;   LAPAROTOMY N/A 10/31/2024   Procedure: REEXPLORATION OF ABDOMEN AND CLOSURE OF MESSENTARY DEFECT AND PLACEMENT OF THERA-VAC;  Surgeon: Tanda Locus, MD;  Location: [facility];  Service: General;  Laterality: N/A;  RE-EXPLORATION OF ABDOMEN   LAPAROTOMY N/A 11/02/2024   Procedure: LAPAROTOMY, RE-EXPLORATION;  Surgeon: Tanda Locus, MD;  Location: [facility] OR;  Service: General;  Laterality: N/A;  RE- EX LAP W/ POSSIBLE ABDOMINAL CLOSURE   LUMBAR  LAMINECTOMY/DECOMPRESSION MICRODISCECTOMY Right 10/11/2018   Procedure: LAMINECTOMY AND FORAMINOTOMY RIGHT LUMBAR FOUR- LUMBAR FIVE;  Surgeon: Lanis Pupa, MD;  Location: [facility] OR;  Service: Neurosurgery;  Laterality: Right;   MASS EXCISION Right 09/23/2021   Procedure: EXCISION MASS VOLAR ASPECT RIGHT HAND;  Surgeon: Murrell Drivers, MD;  Location: [facility];  Service: Orthopedics;  Laterality: Right;  45 MIN   SHOULDER ARTHROSCOPY  06/2011   left-dsc   THROMBECTOMY BRACHIAL ARTERY Left 11/02/2024   Procedure: THROMBECTOMY, ARTERY, RADIAL;  Surgeon: Lanis Fonda BRAVO, MD;  Location[facility] OR;  Service: Vascular;  Laterality: Left;   TONSILLECTOMY  at age 20   TOTAL SHOULDER ARTHROPLASTY Left 08/11/2020   Procedure: TOTAL SHOULDER ARTHROPLASTY;  Surgeon: Josefina Fonda, MD;  Location: [facility];  Service: Orthopedics;  Laterality: Left;   TRIGGER FINGER RELEASE  12/20/2012   Procedure: RELEASE TRIGGER FINGER/A-1 PULLEY;  Surgeon: Drivers JONELLE Murrell, MD;  Location: [facility];  Service: Orthopedics;  Laterality: Left;  LEFT TRIGGER THUMB RELEASE   TRIGGER FINGER RELEASE Right 09/23/2021   Procedure: RELEASE A-1 PULLEY RIGHT INDEX FINGER;  Surgeon: Murrell Drivers, MD;  Location: [facility];  Service: Orthopedics;  Laterality: Right;   WOUND DEBRIDEMENT N/A 11/02/2024   Procedure: IRRIGATION AND DEBRIDEMENT, WOUND, ABDOMEN, WITH CLOSURE;  Surgeon: Tanda Locus, MD;  Location: [facility] OR;  Service: General;  Laterality: N/A;   HPI:  58 yo female presenting 11/18 with abdominal pain. Found to have SBO with possible perforation. S/p ex lap with LOA, G-tube placement 11/18, ex lap with washout 11/20, and washout with abdominal closure and wound vac placement 11/22. Noted ischemic changes of the L hand, orthopedics following. ETT 11/18-11/24. PMH: SBO (2020), roux-en-Y gastric bypass, DM, GERD, Barrett's esophagus    Assessment / Plan / Recommendation   Clinical Impression  Given prolonged intubation with dysphonia, recommend NPO status be maintained with the exception of ice chips or small sips of water  following oral care. G-tube is in place for med administration. Will continue following for further assessment.   Subjectively, pt's voice appears hoarse but she states it is similar to baseline. TF have been started at a trickle and MD note states diet can be advanced to sips of clear liquids s/p SLP evaluation. She is attentive to POs, masticating ice chips and clearing her oral cavity completely. Sips of water  consistently resulted in throat clearance and hydrophonia, seeming to require multiple swallows to clear. Pt cannot consistently be cued to use a more forceful cough.   SLP Visit Diagnosis: Dysphagia, unspecified (R13.10)    Aspiration Risk  Moderate aspiration risk    Diet Recommendation NPO;Ice chips PRN after oral care    Medication Administration: Via alternative means    Other  Recommendations Oral Care Recommendations: Oral care QID;Oral care prior to ice chip/H20     Assistance Recommended at Discharge    Functional Status Assessment Patient has had a  recent decline in their functional status and demonstrates the ability to make significant improvements in function in a reasonable and predictable amount of time.  Frequency and Duration min 2x/week  2 weeks       Prognosis Prognosis for improved oropharyngeal function: Good Barriers to Reach Goals: Time post onset      Swallow Study   General HPI: 58 yo female presenting 11/18 with abdominal pain. Found to have SBO with possible perforation. S/p ex lap with LOA, G-tube placement 11/18, ex lap with washout 11/20, and washout with abdominal closure and wound vac placement 11/22. Noted ischemic changes of the L hand, orthopedics following. ETT 11/18-11/24. PMH: SBO (2020), roux-en-Y gastric bypass, DM, GERD, Barrett's esophagus Type of Study: Bedside Swallow  Evaluation Previous Swallow Assessment: none in chart Diet Prior to this Study: NPO;G-tube Temperature Spikes Noted: No Respiratory Status: Nasal cannula History of Recent Intubation: Yes Total duration of intubation (days): 7 days Date extubated: 11/04/24 Behavior/Cognition: Alert;Requires cueing Oral Cavity Assessment: Within Functional Limits Oral Care Completed by SLP: No Oral Cavity - Dentition: Adequate natural dentition Vision: Functional for self-feeding Self-Feeding Abilities: Needs assist Patient Positioning: Upright in bed Baseline Vocal Quality: Hoarse Volitional Cough: Strong Volitional Swallow: Able to elicit    Oral/Motor/Sensory Function Overall Oral Motor/Sensory Function: Within functional limits   Ice Chips Ice chips: Within functional limits Presentation: Spoon   Thin Liquid Thin Liquid: Impaired Presentation: Straw Pharyngeal  Phase Impairments: Multiple swallows;Wet Vocal Quality;Throat Clearing - Immediate    Nectar Thick Nectar Thick Liquid: Not tested   Honey Thick Honey Thick Liquid: Not tested   Puree Puree: Not tested   Solid     Solid: Not tested      Damien Blumenthal, M.A., CCC-SLP Speech Language Pathology, Acute Rehabilitation Services  Secure Chat preferred [phone]  11/05/2024,1:54 PM

## 2024-11-05 NOTE — Progress Notes (Signed)
 PHARMACY - ANTICOAGULATION CONSULT NOTE  Pharmacy Consult for heparin  infusion Indication: postop vascular procedure  Allergies  Allergen Reactions   Ketoconazole Hives, Swelling and Other (See Comments)    SWELLING REACTION UNSPECIFIED    Pravachol  [Pravastatin  Sodium] Shortness Of Breath, Swelling and Anaphylaxis    Throat swelling   Statins Other (See Comments)    Leg cramps   Tape Dermatitis and Other (See Comments)    Steri-Strips   Repatha  [Evolocumab ] Other (See Comments)    Made the stomach hurt badly.   Codeine Other (See Comments) and Hypertension    Increased B/P    Patient Measurements: Height: 5' 4 (162.6 cm) Weight: 71.6 kg (157 lb 13.6 oz) IBW/kg (Calculated) : 54.7 HEPARIN  DW (KG): 59.9  Vital Signs: Temp: 98.5 F (36.9 C) (11/24 2300) Temp Source: Oral (11/24 2300) BP: 109/62 (11/25 0045) Pulse Rate: 68 (11/25 0045)  Labs: Recent Labs    11/02/24 0147 11/02/24 0247 11/02/24 0355 11/02/24 0853 11/02/24 1631 11/02/24 2053 11/03/24 0353 11/03/24 1935 11/04/24 0500 11/04/24 1729 11/05/24 0055  HGB  --    < >  --  9.8* 9.8*  --  9.2*  --  7.3*  --   --   HCT  --    < >  --  29.2* 29.5*  --  27.5*  --  22.3*  --   --   PLT 8*  --   --  44* 84*  --  64*  --  87*  --   --   APTT 29  --   --   --   --   --   --   --   --   --   --   LABPROT 13.3  --  12.7 12.2  --   --   --   --   --   --   --   INR 1.0  --  0.9 0.9  --   --   --   --   --   --   --   HEPARINUNFRC  --   --   --   --   --    < > <0.10*  --  <0.10* <0.10* <0.10*  CREATININE  --    < > 1.13*  1.13*  --  1.07*  --  1.20* 0.95 0.74 0.60  --    < > = values in this interval not displayed.    Estimated Creatinine Clearance: 75.3 mL/min (by C-G formula based on SCr of 0.6 mg/dL).   Assessment: 58 yo F presenting with E. Coli bacteremia 2/2 SBO with perforation. On 11/22 underwent closure of abdomen in OR and repair of left radial artery. Not on AC PTA. Pharmacy consulted for  heparin  management.  Received 3 units of platelets 11/22.   Hgb is low 7.3, platelets up to 87. No s/sx of bleeding or infusion issues.  Okay per vascular and CCM to titrate up on heparin  infusion for critical L hand ischemia on 11/04/24.    Of note, HIT AB = 0.039 , negative HL <0.10 - subtherapeutic on 850 u/hr  No issues with line/infusion and no s/sx bleeding per bedside RN  Goal of Therapy:  Heparin  level 0.3-0.5 units/ml Monitor platelets by anticoagulation protocol: Yes   Plan:  Increase heparin  infusion to 1000 units/hr  Check heparin  level in 6 hours  Monitor daily heparin  level, CBC, and signs/symptoms of bleeding  Lynwood Poplar, PharmD, BCPS Clinical Pharmacist 11/05/2024 1:43 AM

## 2024-11-05 NOTE — Consult Note (Signed)
 WOC Nurse Consult Note: Reason for Consult: NPWT midline  Wound type: Surgical  Pressure Injury POA: NA, however see notes about sacral pressure injury Measurement: 14cm x 9cm x 4cm  Wound azi:rozjw, subcutaneous tissues  Drainage (amount, consistency, odor) serosanguinous in canister  Periwound: intact Dressing procedure/placement/frequency Removed old NPWT dressing (1pc black) Filled wound with ___2_ piece of black foam  Sealed NPWT dressing at HG Patient received IV pain medication per bedside nurse prior to dressing change Patient tolerated procedure well  WOC nurse will continue to provide NPWT dressing changed due to the complexity of the dressing change.    WOC Nurse wound follow up Wound type: Sacral pressure Injury  Evolving DTPI; appears more like Stage 2 at this time Measurement: see nursing flow sheets Wound bed: partial thickness, 100% clean and pink Drainage (amount, consistency, odor) none Periwound: intact  Dressing procedure/placement/frequency: Cleanse with saline, pat dry.  Apply xeroform to the wound, top with foam change every other day.  LALM in place in the ICU, will ask mattress replacement to be ordered for receiving unit  Trickle TF started will make RD aware of wounds.   Plans for next NPWT dressing Friday, discussed with PA, will notified CCS if acute changes otherwise WOC nursing will plan to see patient independently.   WOC Nurse team will follow with you and see patient within 10 days for wound assessments of the sacral wound.  Please notify WOC nurses of any acute changes in the wounds or any new areas of concern Morgan [name] [facility] MSN, RN,CWOCN, CNS, CWON-AP [phone]

## 2024-11-05 NOTE — Progress Notes (Signed)
 Brief Nutrition Note:   NPO, evaluated by SLP today, moderate aspiration risk Tolerating Vital AF 1.2 at 20 ml/hr, increased to 30 ml/hr by MD this AM TPN at 30 ml/hr  Recommend rechecking BMP given TPN infusing when lab drawn skewing results. HIGH Refeeding risk Serum glucose at 10:55 414 (CBGs 152-260 around this time), potassium 4.0 (falsely elevated?-TPN contains potassium), sodium 133. RN confirms TPN not paused when labs drawn. Afternoon BMP from 11/24 also seems to be contaminated with TPN Phosphorus 2.2 (L), Magnesium  wdl  +constipation. Pt with hx of chronic constipation at baseline (takes Miralax  at home). Recommend considering addition of bowel regimen if remains constipated despite increase in bowel regimen.   Seen by Ortho today, L thumb necrotic distal to MP joint, plan for amputation at some point Large abdominal wound at surgical incision site (image noted in the chart).  Interventions:  1) Recommend continuing TPN at 30 ml/hr; TPN order per TPN Pharmacist 2) Tube Feeding via G-tube: Vital AF 1.2 at 30 ml/hr, goal rate of 60 ml/hr. Add Pro-Source TF20 60 mL daily TF at goal rate provides 1808 kcals, 128 g of protein and 1166 mL of free water  3) Continue IV Thiamine  100 mg 4) Continue IV copper  2mg  daily x 6 days and Zinc  Sulfate 220 mg daily per tube for at least 14 days.  Plan to recheck values post supplementation with CRP 5) Serum Vitamin A  level returned low; assess for repletion once able to take meds by mouth   If continues to tolerate TF titration to goal, ok to discontinue TPN from RD perspective.   Betsey Finger MS, RDN, LDN, CNSC Registered Dietitian 3 Clinical Nutrition RD Inpatient Contact Info in Amion

## 2024-11-05 NOTE — Consult Note (Signed)
 Reason for Consult:Left hand ischemia Referring Physician: Toribio Sharps Time called: 9164 Time at bedside: 9047   Morgan [name] is an 58 y.o. female.  HPI: Shamiyah developed septic shock 2/2 SBO and biliary leak. She required pressors and developed a thrombosed radial artery and underwent thrombectomy. She developed ischemic changes in the hand and hand surgery was consulted. She is RHD.  Past Medical History:  Diagnosis Date   Allergic rhinitis    Anemia     after gastric bypass in 2018   Anxiety    Barrett's esophagus    Bipolar affective disorder (HCC)    CAP (community acquired pneumonia) 07/20/2018   Chronic respiratory failure (HCC)    Constipation, chronic    Degenerative joint disease of spine    Depression    Diabetes mellitus without complication (HCC)    type 2    DM (diabetes mellitus) (HCC)    hypoglycemic per pt - no longer takes DM meds due to weight loss from gastric bypass in 2018   Dry eye    Family history of adverse reaction to anesthesia    nausea and vomiting   Gastroparesis    GERD (gastroesophageal reflux disease)    H/O shoulder replacement 08/11/2020   left shoulder   History of bariatric surgery 03/2017   History of hiatal hernia    HLD (hyperlipidemia) 03/12/2013   Hyperlipidemia    Hypertension    No HTN meds since weight loss from Gastric Bypass in 2018   Intractable chronic migraine without aura 06/04/2015   Migraine headache    Morbid obesity (HCC)    OSA (obstructive sleep apnea)    No cpap since gastric surgery   Osteoarthritis    bilateral knee   SBO (small bowel obstruction) (HCC) 03/19/2019   Sleep apnea    Unspecified hypothyroidism    Vitamin B 12 deficiency    Vitamin D  deficiency     Past Surgical History:  Procedure Laterality Date   ANKLE ARTHROSCOPY WITH RECONSTRUCTION Right 09/24/2019   Procedure: ANKLE ARTHROSCOPY DEBRIDEMENT TREATMENT OF OSTEOCHONDRAL LESION TALUS. PRONEAL TENDON DEBRIDEMENT;  Surgeon: Elsa Lonni JONELLE, MD;  Location: [facility];  Service: Orthopedics;  Laterality: Right;  SURGERY REQUEST TIME 2 HOURS   APPENDECTOMY  1978   APPLICATION OF WOUND VAC N/A 11/02/2024   Procedure: APPLICATION, WOUND VAC;  Surgeon: Tanda Locus, MD;  Location: [facility];  Service: General;  Laterality: N/A;   CHOLECYSTECTOMY  2005   COLONOSCOPY     disc repair  05/17/2019   with rods, lumbar spine   GASTRIC ROUX-EN-Y N/A 03/21/2017   Procedure: LAPAROSCOPIC ROUX-EN-Y GASTRIC, UPPER ENDO;  Surgeon: Locus Tanda, MD;  Location: [facility];  Service: General;  Laterality: N/A;   GASTROSTOMY N/A 06/19/2018   Procedure: LAPRASCOPIC INSERTION OF GASTROSTOMY TUBE;  Surgeon: Tanda Locus, MD;  Location: [facility];  Service: General;  Laterality: N/A;   GASTROSTOMY TUBE PLACEMENT Left    06/2018   HIATAL HERNIA REPAIR N/A 06/19/2018   Procedure: LAPAROSCOPIC REPAIR OF HIATAL HERNIA;  Surgeon: Tanda Locus, MD;  Location: [facility];  Service: General;  Laterality: N/A;   LAPAROSCOPIC LYSIS OF ADHESIONS  03/19/2019   Dr. Mitzie Freund   LAPAROSCOPY N/A 06/19/2018   Procedure: LAPAROSCOPY DIAGNOSTIC;  Surgeon: Tanda Locus, MD;  Location: [facility];  Service: General;  Laterality: N/A;   LAPAROSCOPY N/A 03/19/2019   Procedure: LAPAROSCOPY DIAGNOSTIC lysis of adhesions;  Surgeon: Freund Mitzie LABOR, MD;  Location: [facility];  Service: General;  Laterality: N/A;   LAPAROTOMY N/A 10/29/2024   Procedure: LAPAROTOMY, EXPLORATORY, PLACEMENT OF G-TUBE, WOUND VAC;  Surgeon: Vernetta Berg, MD;  Location: [facility];  Service: General;  Laterality: N/A;   LAPAROTOMY N/A 10/31/2024   Procedure: REEXPLORATION OF ABDOMEN AND CLOSURE OF MESSENTARY DEFECT AND PLACEMENT OF THERA-VAC;  Surgeon: Tanda Locus, MD;  Location: [facility];  Service: General;  Laterality: N/A;  RE-EXPLORATION OF ABDOMEN   LAPAROTOMY N/A 11/02/2024   Procedure: LAPAROTOMY, RE-EXPLORATION;  Surgeon: Tanda Locus, MD;  Location: [facility] OR;  Service: General;   Laterality: N/A;  RE- EX LAP W/ POSSIBLE ABDOMINAL CLOSURE   LUMBAR LAMINECTOMY/DECOMPRESSION MICRODISCECTOMY Right 10/11/2018   Procedure: LAMINECTOMY AND FORAMINOTOMY RIGHT LUMBAR FOUR- LUMBAR FIVE;  Surgeon: Lanis Pupa, MD;  Location: [facility];  Service: Neurosurgery;  Laterality: Right;   MASS EXCISION Right 09/23/2021   Procedure: EXCISION MASS VOLAR ASPECT RIGHT HAND;  Surgeon: Murrell Drivers, MD;  Location: [facility];  Service: Orthopedics;  Laterality: Right;  45 MIN   SHOULDER ARTHROSCOPY  06/2011   left-dsc   THROMBECTOMY BRACHIAL ARTERY Left 11/02/2024   Procedure: THROMBECTOMY, ARTERY, RADIAL;  Surgeon: Lanis Fonda BRAVO, MD;  Location: [facility];  Service: Vascular;  Laterality: Left;   TONSILLECTOMY  at age 45   TOTAL SHOULDER ARTHROPLASTY Left 08/11/2020   Procedure: TOTAL SHOULDER ARTHROPLASTY;  Surgeon: Josefina Fonda, MD;  Location: [facility];  Service: Orthopedics;  Laterality: Left;   TRIGGER FINGER RELEASE  12/20/2012   Procedure: RELEASE TRIGGER FINGER/A-1 PULLEY;  Surgeon: Drivers JONELLE Murrell, MD;  Location: [facility];  Service: Orthopedics;  Laterality: Left;  LEFT TRIGGER THUMB RELEASE   TRIGGER FINGER RELEASE Right 09/23/2021   Procedure: RELEASE A-1 PULLEY RIGHT INDEX FINGER;  Surgeon: Murrell Drivers, MD;  Location: [facility];  Service: Orthopedics;  Laterality: Right;   WOUND DEBRIDEMENT N/A 11/02/2024   Procedure: IRRIGATION AND DEBRIDEMENT, WOUND, ABDOMEN, WITH CLOSURE;  Surgeon: Tanda Locus, MD;  Location: [facility] OR;  Service: General;  Laterality: N/A;    Family History  Problem Relation Age of Onset   Asthma Father    Allergies Father    Heart disease Father        enlarged heart   Peripheral vascular disease Father    Diabetes Father    Hyperlipidemia Father    Arthritis Father    Asthma Sister    Cancer Sister        colon at 55 yr old.   Colon cancer Sister    Allergies Mother    Stroke Mother 8        with hemi paralysis   Diabetes Mother    Hyperlipidemia Mother    Hypertension Mother    GI problems Mother    Arthritis Mother    Allergies Brother    Early death Brother 6       congenital abormality   Allergies Sister    Diabetes Sister    Asthma Sister    Colon polyps Sister    Hyperlipidemia Sister    GI problems Sister        gastroporesis    Liver disease Sister        fatty liver   Stroke Sister        intercrandial bleed   Diabetes Brother    Hypertension Brother    Hyperlipidemia Brother    Heart disease Paternal Aunt    Migraines Neg Hx     Social  History:  reports that she has never smoked. She has been exposed to tobacco smoke. She has never used smokeless tobacco. She reports that she does not drink alcohol  and does not use drugs.  Allergies:  Allergies  Allergen Reactions   Ketoconazole Hives, Swelling and Other (See Comments)    SWELLING REACTION UNSPECIFIED    Pravachol  [Pravastatin  Sodium] Shortness Of Breath, Swelling and Anaphylaxis    Throat swelling   Statins Other (See Comments)    Leg cramps   Tape Dermatitis and Other (See Comments)    Steri-Strips   Repatha  [Evolocumab ] Other (See Comments)    Made the stomach hurt badly.   Codeine Other (See Comments) and Hypertension    Increased B/P    Medications: I have reviewed the patient's current medications.  Results for orders placed or performed during the hospital encounter of 10/29/24 (from the past 48 hours)  Glucose, capillary     Status: Abnormal   Collection Time: 11/03/24 11:00 AM  Result Value Ref Range   Glucose-Capillary 123 (H) 70 - 99 mg/dL    Comment: Glucose reference range applies only to samples taken after fasting for at least 8 hours.  Glucose, capillary     Status: Abnormal   Collection Time: 11/03/24  3:56 PM  Result Value Ref Range   Glucose-Capillary 113 (H) 70 - 99 mg/dL    Comment: Glucose reference range applies only to samples taken after fasting for  at least 8 hours.  Glucose, capillary     Status: Abnormal   Collection Time: 11/03/24  7:24 PM  Result Value Ref Range   Glucose-Capillary 134 (H) 70 - 99 mg/dL    Comment: Glucose reference range applies only to samples taken after fasting for at least 8 hours.  Basic metabolic panel     Status: Abnormal   Collection Time: 11/03/24  7:35 PM  Result Value Ref Range   Sodium 135 135 - 145 mmol/L   Potassium 4.0 3.5 - 5.1 mmol/L   Chloride 96 (L) 98 - 111 mmol/L   CO2 27 22 - 32 mmol/L   Glucose, Bld 180 (H) 70 - 99 mg/dL    Comment: Glucose reference range applies only to samples taken after fasting for at least 8 hours.   BUN 36 (H) 6 - 20 mg/dL   Creatinine, Ser 9.04 0.44 - 1.00 mg/dL   Calcium  7.2 (L) 8.9 - 10.3 mg/dL   GFR, Estimated >39 >39 mL/min    Comment: (NOTE) Calculated using the CKD-EPI Creatinine Equation (2021)    Anion gap 12 5 - 15    Comment: Performed at [facility], [address] [place], [place] [address]  Glucose, capillary     Status: Abnormal   Collection Time: 11/03/24 11:06 PM  Result Value Ref Range   Glucose-Capillary 165 (H) 70 - 99 mg/dL    Comment: Glucose reference range applies only to samples taken after fasting for at least 8 hours.  Glucose, capillary     Status: Abnormal   Collection Time: 11/04/24  3:30 AM  Result Value Ref Range   Glucose-Capillary 201 (H) 70 - 99 mg/dL    Comment: Glucose reference range applies only to samples taken after fasting for at least 8 hours.  Cooxemetry Panel (carboxy, met, total hgb, O2 sat)     Status: Abnormal   Collection Time: 11/04/24  4:59 AM  Result Value Ref Range   Total hemoglobin 7.5 (L) 12.0 - 16.0 g/dL   O2  Saturation 76 %   Carboxyhemoglobin 2.1 (H) 0.5 - 1.5 %   Methemoglobin <0.7 0.0 - 1.5 %    Comment: Performed at [facility], [address], [place], [place] [address]  Heparin  level (unfractionated)     Status: Abnormal   Collection Time: 11/04/24  5:00 AM  Result Value  Ref Range   Heparin  Unfractionated <0.10 (L) 0.30 - 0.70 IU/mL    Comment: (NOTE) The clinical reportable range upper limit is being lowered to >1.10 to align with the FDA approved guidance for the current laboratory assay.  If heparin  results are below expected values, and patient dosage has  been confirmed, suggest follow up testing of antithrombin III levels. Performed at [facility], [address], [place], [place] [address]   CBC     Status: Abnormal   Collection Time: 11/04/24  5:00 AM  Result Value Ref Range   WBC 10.4 4.0 - 10.5 K/uL   RBC 2.54 (L) 3.87 - 5.11 MIL/uL   Hemoglobin 7.3 (L) 12.0 - 15.0 g/dL   HCT 77.6 (L) 63.9 - 53.9 %   MCV 87.8 80.0 - 100.0 fL   MCH 28.7 26.0 - 34.0 pg   MCHC 32.7 30.0 - 36.0 g/dL   RDW 85.0 88.4 - 84.4 %   Platelets 87 (L) 150 - 400 K/uL    Comment: REPEATED TO VERIFY SPECIMEN CHECKED FOR CLOTS Immature Platelet Fraction may be clinically indicated, consider ordering this additional test [id number]    nRBC 0.0 0.0 - 0.2 %    Comment: Performed at [facility], [address] [place], [place] [address]  Comprehensive metabolic panel     Status: Abnormal   Collection Time: 11/04/24  5:00 AM  Result Value Ref Range   Sodium 136 135 - 145 mmol/L   Potassium 2.9 (L) 3.5 - 5.1 mmol/L   Chloride 94 (L) 98 - 111 mmol/L   CO2 34 (H) 22 - 32 mmol/L   Glucose, Bld 188 (H) 70 - 99 mg/dL    Comment: Glucose reference range applies only to samples taken after fasting for at least 8 hours.   BUN 31 (H) 6 - 20 mg/dL   Creatinine, Ser 9.25 0.44 - 1.00 mg/dL   Calcium  7.2 (L) 8.9 - 10.3 mg/dL   Total Protein 4.1 (L) 6.5 - 8.1 g/dL   Albumin  <1.5 (L) 3.5 - 5.0 g/dL   AST 88 (H) 15 - 41 U/L   ALT 64 (H) 0 - 44 U/L   Alkaline Phosphatase 48 38 - 126 U/L   Total Bilirubin 0.8 0.0 - 1.2 mg/dL   GFR, Estimated >39 >39 mL/min    Comment: (NOTE) Calculated using the CKD-EPI Creatinine Equation (2021)    Anion gap 8 5 - 15     Comment: Performed at [facility], [address] [place], [place] [address]  Magnesium      Status: None   Collection Time: 11/04/24  5:00 AM  Result Value Ref Range   Magnesium  1.7 1.7 - 2.4 mg/dL    Comment: Performed at [facility], [address], [place], [place] [address]  Phosphorus     Status: None   Collection Time: 11/04/24  5:00 AM  Result Value Ref Range   Phosphorus 2.6 2.5 - 4.6 mg/dL    Comment: Performed at [facility], [address], [place], [place] [address]  Triglycerides     Status: Abnormal  Collection Time: 11/04/24  5:00 AM  Result Value Ref Range   Triglycerides 157 (H) <150 mg/dL    Comment: Performed at [facility], [address], [place], [place] [address]  Glucose, capillary     Status: Abnormal   Collection Time: 11/04/24  7:28 AM  Result Value Ref Range   Glucose-Capillary 182 (H) 70 - 99 mg/dL    Comment: Glucose reference range applies only to samples taken after fasting for at least 8 hours.  Glucose, capillary     Status: Abnormal   Collection Time: 11/04/24 11:39 AM  Result Value Ref Range   Glucose-Capillary 230 (H) 70 - 99 mg/dL    Comment: Glucose reference range applies only to samples taken after fasting for at least 8 hours.  Magnesium      Status: None   Collection Time: 11/04/24  5:29 PM  Result Value Ref Range   Magnesium  2.3 1.7 - 2.4 mg/dL    Comment: Performed at [facility], [address], [place], [place] [address]  Phosphorus     Status: None   Collection Time: 11/04/24  5:29 PM  Result Value Ref Range   Phosphorus 2.8 2.5 - 4.6 mg/dL    Comment: Performed at [facility], [address] [place], [place] [address]  Heparin  level (unfractionated)     Status: Abnormal   Collection Time: 11/04/24  5:29 PM  Result Value Ref Range   Heparin  Unfractionated <0.10 (L) 0.30 - 0.70 IU/mL    Comment: (NOTE) The clinical reportable range upper limit is being lowered to >1.10 to align with  the FDA approved guidance for the current laboratory assay.  If heparin  results are below expected values, and patient dosage has  been confirmed, suggest follow up testing of antithrombin III levels. Performed at [facility], [address], [place], [place] [address]   Basic metabolic panel     Status: Abnormal   Collection Time: 11/04/24  5:29 PM  Result Value Ref Range   Sodium 135 135 - 145 mmol/L   Potassium 4.3 3.5 - 5.1 mmol/L   Chloride 90 (L) 98 - 111 mmol/L   CO2 35 (H) 22 - 32 mmol/L   Glucose, Bld 524 (HH) 70 - 99 mg/dL    Comment: CRITICAL RESULT CALLED TO, READ BACK BY AND VERIFIED WITH L. BRADISH, RN AT 1821 11.24.25 D. BLU Glucose reference range applies only to samples taken after fasting for at least 8 hours.    BUN 27 (H) 6 - 20 mg/dL   Creatinine, Ser 9.39 0.44 - 1.00 mg/dL   Calcium  7.1 (L) 8.9 - 10.3 mg/dL   GFR, Estimated >39 >39 mL/min    Comment: (NOTE) Calculated using the CKD-EPI Creatinine Equation (2021)    Anion gap 10 5 - 15    Comment: Performed at [facility], [address], [place], [place] [address]  Glucose, capillary     Status: Abnormal   Collection Time: 11/04/24  5:35 PM  Result Value Ref Range   Glucose-Capillary 257 (H) 70 - 99 mg/dL    Comment: Glucose reference range applies only to samples taken after fasting for at least 8 hours.   Comment 1 Notify RN    Comment 2 Document in Chart   Glucose, capillary     Status: Abnormal   Collection Time: 11/04/24  7:20 PM  Result Value Ref Range   Glucose-Capillary 238 (H) 70 - 99 mg/dL  Comment: Glucose reference range applies only to samples taken after fasting for at least 8 hours.  Glucose, capillary     Status: Abnormal   Collection Time: 11/04/24 11:15 PM  Result Value Ref Range   Glucose-Capillary 182 (H) 70 - 99 mg/dL    Comment: Glucose reference range applies only to samples taken after fasting for at least 8 hours.  Heparin  level (unfractionated)     Status:  Abnormal   Collection Time: 11/05/24 12:55 AM  Result Value Ref Range   Heparin  Unfractionated <0.10 (L) 0.30 - 0.70 IU/mL    Comment: (NOTE) The clinical reportable range upper limit is being lowered to >1.10 to align with the FDA approved guidance for the current laboratory assay.  If heparin  results are below expected values, and patient dosage has  been confirmed, suggest follow up testing of antithrombin III levels. Performed at [facility], [address] [place], [place] [address]   CBC     Status: Abnormal   Collection Time: 11/05/24  3:37 AM  Result Value Ref Range   WBC 9.1 4.0 - 10.5 K/uL   RBC 2.43 (L) 3.87 - 5.11 MIL/uL   Hemoglobin 6.9 (LL) 12.0 - 15.0 g/dL    Comment: REPEATED TO VERIFY This critical result has been called to K. Aboagye, RN by Miguel Reveal on 11/05/2024 04:07:20, and has been read back.    HCT 21.6 (L) 36.0 - 46.0 %   MCV 88.9 80.0 - 100.0 fL   MCH 28.4 26.0 - 34.0 pg   MCHC 31.9 30.0 - 36.0 g/dL   RDW 84.8 88.4 - 84.4 %   Platelets 135 (L) 150 - 400 K/uL   nRBC 0.2 0.0 - 0.2 %    Comment: Performed at [facility], [address] [place], [place] [address]  Magnesium      Status: None   Collection Time: 11/05/24  3:37 AM  Result Value Ref Range   Magnesium  2.0 1.7 - 2.4 mg/dL    Comment: Performed at [facility], [address], [place], [place] [address]  Phosphorus     Status: Abnormal   Collection Time: 11/05/24  3:37 AM  Result Value Ref Range   Phosphorus 2.2 (L) 2.5 - 4.6 mg/dL    Comment: Performed at [facility], [address] [place], [place] [address]  Cooxemetry Panel (carboxy, met, total hgb, O2 sat)     Status: Abnormal   Collection Time: 11/05/24  3:37 AM  Result Value Ref Range   Total hemoglobin 7.1 (L) 12.0 - 16.0 g/dL   O2 Saturation 33.6 %   Carboxyhemoglobin 1.5 0.5 - 1.5 %   Methemoglobin <0.7 0.0 - 1.5 %    Comment: Performed at [facility], [address] [place],  [place] [address]  Glucose, capillary     Status: Abnormal   Collection Time: 11/05/24  3:46 AM  Result Value Ref Range   Glucose-Capillary 135 (H) 70 - 99 mg/dL    Comment: Glucose reference range applies only to samples taken after fasting for at least 8 hours.  Prepare RBC (crossmatch)     Status: None   Collection Time: 11/05/24  4:55 AM  Result Value Ref Range   Order Confirmation      ORDER PROCESSED BY BLOOD BANK Performed at [facility], [address], [place], [place] [address]   Heparin  level (unfractionated)     Status: Abnormal   Collection Time:  11/05/24  8:14 AM  Result Value Ref Range   Heparin  Unfractionated <0.10 (L) 0.30 - 0.70 IU/mL    Comment: (NOTE) The clinical reportable range upper limit is being lowered to >1.10 to align with the FDA approved guidance for the current laboratory assay.  If heparin  results are below expected values, and patient dosage has  been confirmed, suggest follow up testing of antithrombin III levels. Performed at [facility], [address], [place], [place] [address]   Glucose, capillary     Status: Abnormal   Collection Time: 11/05/24  8:17 AM  Result Value Ref Range   Glucose-Capillary 260 (H) 70 - 99 mg/dL    Comment: Glucose reference range applies only to samples taken after fasting for at least 8 hours.    No results found.  Review of Systems  HENT:  Negative for ear discharge, ear pain, hearing loss and tinnitus.   Eyes:  Negative for photophobia and pain.  Respiratory:  Negative for cough and shortness of breath.   Cardiovascular:  Negative for chest pain.  Gastrointestinal:  Negative for abdominal pain, nausea and vomiting.  Genitourinary:  Negative for dysuria, flank pain, frequency and urgency.  Musculoskeletal:  Negative for arthralgias, back pain, myalgias and neck pain.  Neurological:  Negative for dizziness and headaches.  Hematological:  Does not bruise/bleed easily.  Psychiatric/Behavioral:  The  patient is not nervous/anxious.    Blood pressure 104/65, pulse 63, temperature 98.1 F (36.7 C), temperature source Oral, resp. rate 12, height 5' 4 (1.626 m), weight 73 kg, last menstrual period 10/17/2017, SpO2 96%. Physical Exam Constitutional:      General: She is not in acute distress.    Appearance: She is well-developed. She is ill-appearing. She is not diaphoretic.  HENT:     Head: Normocephalic and atraumatic.  Eyes:     General: No scleral icterus.       Right eye: No discharge.        Left eye: No discharge.     Conjunctiva/sclera: Conjunctivae normal.  Cardiovascular:     Rate and Rhythm: Normal rate and regular rhythm.  Pulmonary:     Effort: Pulmonary effort is normal. No respiratory distress.  Musculoskeletal:     Cervical back: Normal range of motion.     Comments: Left shoulder, elbow, wrist, digits- no skin wounds, nontender, no instability, no blocks to motion, ischemic changes involving the radial distal FA, thumb, 1st webspace, and radial index finger primarily  Sens  Ax/R/M/U intact  Mot   Ax/ R/ PIN/ M intact, AIN/U weak  Rad 2+  Skin:    General: Skin is warm and dry.  Neurological:     Mental Status: She is alert.  Psychiatric:        Mood and Affect: Mood normal.        Behavior: Behavior normal.     Assessment/Plan: Left hand ischemia -- Will need to wait for hand to declare and demarcate, process will likely take many weeks. Recommend OT involvement to ensure hand stays mobile and to work on function. F/u with [name] in 3-4 weeks.    Ozell DOROTHA Ned, PA-C Orthopedic Surgery [phone] 11/05/2024, 9:57 AM

## 2024-11-05 NOTE — Progress Notes (Signed)
 PHARMACY - TOTAL PARENTERAL NUTRITION CONSULT NOTE   Indication: Small bowel obstruction   Patient Measurements: Height: 5' 4 (162.6 cm) Weight: 73 kg (160 lb 15 oz) IBW/kg (Calculated) : 54.7 TPN AdjBW (KG): 59.9 Body mass index is 27.62 kg/m.  Assessment: 58 years of age female with history of Roux-en-Y gastric bypass admitted for septic shock and acute abdomen who went emergently on 11/18 for exploratory surgery requiring lysis of adhesions, G-tube placement and wound vac. She continued to be in sepsis and returned to OR for repeat exlap 11/20 but no sources of ischemic bowel found. 11/22 back to OR for exlap and abdominal closure. Day 6 of NPO status inpatient. Pharmacy consulted for TPN. EF from POCUS 11/22 estimated at 35-40% (up from 20% on ECHO 11/20). High refeeding risk due to significant weight loss and inadequate oral intake for several months.    Low electrolytes noted 11/24 AM after starting TPN yesterday.   Glucose / Insulin : CBG 135-260 (13 units sSSI used). Off of hydrocortisone  11/24 AM.  A1c 5.3 Electrolytes: TPN not paused before labs drawn, so numbers are skewed on BMP. Phos 2.2, Mag 2.0.  Renal: SCr 0.56 (down), BUN 23 (down).  Hepatic: LFTs trending down. Tbili 0.8 (down). TG 157 off prop. No jaundice. Intake / Output; MIVF: UOP 2.9 L (1.6 mL/kg/hr) last 24 hours after 40 mg IV lasix  (11/23-11/24), Wound vac output 25 mL. Net IO Since Admission: 13,700.39 mL [11/05/24 1002] GI Imaging: none since start of TPN GI Surgeries / Procedures:  11/18 Exlap, lysis of adhesions, G-tube placement, ABthera VAC placement 11/20 Exlap, left open 11/22 Exlap with abdominal closure  Central access: Triple lumen PICC TPN start date: 11/03/24  Nutritional Goals: Goal concentrated TPN rate is 58 mL/hr (provides 100 g of protein and 1800 kcals per day) Goal dextrose  (18.5%) Goal unconcentrated TPN rate is 75 mL/hr (provides 100 g of protein and 1915 kcals per day)  RD  Assessment: Estimated Needs Total Energy Estimated Needs: 1700-1900 kcals Total Protein Estimated Needs: 90-110 grams Total Fluid Estimated Needs: >/= 1.8L  Current Nutrition:  Vital AF 1.2 @ 30 today (864 kcal)   Plan: Give 15 mmol Kphos again today.  Continue concentrated TPN at 30 mL/hr at 1800 with reduced dextrose  (15%) due to refeeding risk - this provides ~50% of needs.  Electrolytes in TPN: Na 47mEq/L, K 60 mEq/L, Ca 2.5mEq/L, Mg 25mEq/L, and phos 15 mmol/L. Cl:Ac 1.5:1 (entered as max chloride due to bag constituents).  Add standard MVI and trace elements to TPN Increase to Moderate q4h SSI and adjust as needed.  Monitor TPN labs on Mon/Thurs, daily for 3 days at least (ordered BID now) On Thiamine  IV outside of TPN.   Rankin Sams, PharmD, BCPS, BCCCP Clinical Pharmacist

## 2024-11-05 NOTE — Progress Notes (Signed)
 NAME:  Morgan [name], MRN:  [id number], DOB:  1966-06-14, LOS: 7 ADMISSION DATE:  10/29/2024, CONSULTATION DATE:  10/30/2024 REFERRING MD:  Dr. Vernetta CHIEF COMPLAINT:  Small bowel Obstruction   Brief HPI  Briefly, 58 year old female with history of Roux-en-Y gastric bypass who presented to the emergency department yesterday evening with abdominal pain, lactic acidosis and lipase greater than 2800.  She had CCS evaluation and went for exploratory laparotomy.  Found to have dense adhesive band that was obstructing her proximal small bowel as well as biliary leak without evidence of perforation.  She had a gastrostomy tube placed in the left open with a wound VAC.  She is in continuity.  Also in septic shock, she returned from OR to ICU on Levophed , vasopressin , intubated on bicarb drip.  Pertinent  Medical History   Past Medical History:  Diagnosis Date   Allergic rhinitis    Anemia     after gastric bypass in 2018   Anxiety    Barrett's esophagus    Bipolar affective disorder (HCC)    CAP (community acquired pneumonia) 07/20/2018   Chronic respiratory failure (HCC)    Constipation, chronic    Degenerative joint disease of spine    Depression    Diabetes mellitus without complication (HCC)    type 2    DM (diabetes mellitus) (HCC)    hypoglycemic per pt - no longer takes DM meds due to weight loss from gastric bypass in 2018   Dry eye    Family history of adverse reaction to anesthesia    nausea and vomiting   Gastroparesis    GERD (gastroesophageal reflux disease)    H/O shoulder replacement 08/11/2020   left shoulder   History of bariatric surgery 03/2017   History of hiatal hernia    HLD (hyperlipidemia) 03/12/2013   Hyperlipidemia    Hypertension    No HTN meds since weight loss from Gastric Bypass in 2018   Intractable chronic migraine without aura 06/04/2015   Migraine headache    Morbid obesity (HCC)    OSA (obstructive sleep apnea)    No cpap since gastric  surgery   Osteoarthritis    bilateral knee   SBO (small bowel obstruction) (HCC) 03/19/2019   Sleep apnea    Unspecified hypothyroidism    Vitamin B 12 deficiency    Vitamin D  deficiency      Significant Hospital Events: Including procedures, antibiotic start and stop dates in addition to other pertinent events   11/18 admit 11/20 back to OR for look, weaning pressors  11/22 Trx to 2H due to left hand ischemia. Had radial artery embolectomy and abdominal washout & closure.  11/24: wean to extubate today  Interim History / Subjective:  Hgb drop to 6.9 overnight, getting 1U PRBC now. Plt continue to recover. Coox 66 and turning off DBA this morning. Will transfer out  Objective    Blood pressure 102/60, pulse 69, temperature 97.9 F (36.6 C), temperature source Axillary, resp. rate 13, height 5' 4 (1.626 m), weight 73 kg, last menstrual period 10/17/2017, SpO2 95%. CVP:  [2 mmHg-8 mmHg] 5 mmHg  FiO2 (%):  [40 %] 40 %   Intake/Output Summary (Last 24 hours) at 11/05/2024 0756 Last data filed at 11/05/2024 0500 Gross per 24 hour  Intake 2178.27 ml  Output 2900 ml  Net -721.73 ml   Filed Weights   11/03/24 0500 11/04/24 0500 11/05/24 0700  Weight: 80.1 kg 71.6 kg 73 kg    Examination:  General: middle aged female, acute on chronically ill appearing no distress  HEENT: ncat, perrla, anicteric sclera, mucous membranes dry  CV:  s1s2, no m/r/g, rrr  pulm: resp even and unlabored; CTAB Abs: rounded, soft, mild ttp generalized, midline wound vac and gastrostomy tube  Extremities: left hand and thumb wrapped in dressing, warm; good radial pulse. RUE PICC line, no significant putting edema of lower extremities  Neuro: awake, alert, tells me her name and hospital; complaining of mild non localized abdominal pain  GU: foley   Coox 66 CVP 5 Hgb 6.9 Plt 135  Resolved problem list   Leukopenia Metabolic acidosis Hypokalemia Hyperphosphatemia  Assessment and Plan   Septic  shock due to perforated SBO with peritonitis, E Coli bacteremia SBO due to adhesion, s/p ex lap with LOA on 11/18; has had washouts on 11/20 and abdominal closure on 11/22. Has G-tube in gastric remnant History of Roux-en-Y bariatric surgery -post-op care per surgery -con't gastric drain to suction -restarted TF 11/24, with TF and TPN concern for refeeding syndrome with low phos  -TPN started on 11/23 with low albumin , hx bariatric -> titrate down with increasing TF - zosyn  until 11/27 for ecoli bactermia; micafungin  until 11/25 for peritonitis  - con't trying to avoid peripheral vasoconstrictors with hand ischemia  Acute HFrEF- concerning given CAD hx vs septic cardiomyopathy  - coox 66%, cvp 5. Appears euvolemic. BP good. Will stop dobutamine   - avoid pressors  - AHF following - add GDMT per AHF - started on spironolactone  11/24 - may need cath prior to DC given CAD hx   Critical left hand ischemia due to thrombosed radial artery, s/p thrombectomy-previous left radial aline, d/c'd 11/20 after non-functioning with progressive ischemia and absent radial signals  - VVS following  - increased heparin  to therapeutic after okay from VVS 11/24 (hemoglobin drop but with stable labs and no evidence of bleeding will continue heparin  gtt for now) - need to be careful with diuresis to prevent poor hand perfusion - further management per hand surgery   Thrombocytopenia due to sepsis, recovering - no need for platelet transfusion; monitor  Acute blood loss anemia Hgb 6.9 - 1U PRBC currently running  - recheck hgb post transfusion  Acute hypoxic respiratory failure postoperatively RLL PNA- suspect aspiration from SBO - extubated successfully 11/24 - wean O2 for sat >92%  - IS and mobilize as able  Hypoglycemia  H/o DM2 but  A1c 5.3 and not on meds PTA-- resolved after Roux-en-Y? -monitor; not needing insulin  - watch with stopping steroids   Hypothyroidism - con't synthroid   Acute  kidney injury, improving -strict I/O -renally dose meds, avoid nephrotoxic meds  Elevated transaminases, hyperbilirubinemia - stabilizing  - bilirubin back to normal   Depression/Anxiety  - restarted home zoloft  100mg  11/25, can increase to home 200mg  on 11/27  Labs   CBC: Recent Labs  Lab 10/29/24 1541 10/29/24 2131 10/31/24 0736 10/31/24 0931 11/02/24 0853 11/02/24 1631 11/03/24 0353 11/04/24 0500 11/05/24 0337  WBC 1.1*   < > 11.6*   < > 13.7* 12.9* 11.1* 10.4 9.1  NEUTROABS 1.0*  --  10.5*  --   --   --   --   --   --   HGB 13.0   < > 12.4   < > 9.8* 9.8* 9.2* 7.3* 6.9*  HCT 42.4   < > 38.0   < > 29.2* 29.5* 27.5* 22.3* 21.6*  MCV 94.4   < > 88.8   < >  85.6 86.0 85.1 87.8 88.9  PLT 362   < > 45*   < > 44* 84* 64* 87* 135*   < > = values in this interval not displayed.    Basic Metabolic Panel: Recent Labs  Lab 11/02/24 0355 11/02/24 1631 11/03/24 0353 11/03/24 1935 11/04/24 0500 11/04/24 1729 11/05/24 0337  NA 129*  129* 132* 133* 135 136 135  --   K 3.6  3.6 3.7 3.8 4.0 2.9* 4.3  --   CL 88*  88* 90* 91* 96* 94* 90*  --   CO2 30  30 25 29 27  34* 35*  --   GLUCOSE 95  95 76 86 180* 188* 524*  --   BUN 34*  34* 35* 40* 36* 31* 27*  --   CREATININE 1.13*  1.13* 1.07* 1.20* 0.95 0.74 0.60  --   CALCIUM  6.9*  6.9* 7.2* 7.2* 7.2* 7.2* 7.1*  --   MG 2.1  2.1  --  2.2  --  1.7 2.3 2.0  PHOS 5.8*  5.8*  --  4.6  --  2.6 2.8 2.2*   GFR: Estimated Creatinine Clearance: 75.9 mL/min (by C-G formula based on SCr of 0.6 mg/dL). Recent Labs  Lab 10/31/24 1240 10/31/24 1407 11/01/24 0450 11/01/24 1544 11/02/24 0355 11/02/24 0853 11/02/24 1631 11/03/24 0353 11/04/24 0500 11/05/24 0337  WBC  --    < > 14.3* 9.6  --    < > 12.9* 11.1* 10.4 9.1  LATICACIDVEN 3.3*  --  1.7 1.4 1.1  --   --   --   --   --    < > = values in this interval not displayed.    Liver Function Tests: Recent Labs  Lab 10/30/24 1141 11/01/24 0450 11/01/24 0920  11/02/24 0355 11/03/24 0353 11/04/24 0500  AST 123* 124*  --  78*  78* 80* 88*  ALT 130* 103*  --  72*  72* 66* 64*  ALKPHOS 48 62  --  44  44 54 48  BILITOT <0.2 0.7 0.7 0.8  0.8 1.3* 0.8  PROT 3.4* 4.3*  --  3.8*  3.8* 4.2* 4.1*  ALBUMIN  2.2* 2.1*  --  <1.5*  <1.5* 1.6* <1.5*   Recent Labs  Lab 10/29/24 1541 10/30/24 0220 10/31/24 0446 11/01/24 0450  LIPASE >2,800* 882* 78* 22    CC: na     Tinnie FORBES Adolph DEVONNA [facility] 11/05/24 7:56 AM  Please see [email or web address] for pager details.  From 7A-7P if no response, please call [phone] After hours, please call ELink [phone]

## 2024-11-05 NOTE — Progress Notes (Addendum)
 PHARMACY - ANTICOAGULATION CONSULT NOTE  Pharmacy Consult for heparin  infusion Indication: postop vascular procedure  Allergies  Allergen Reactions   Ketoconazole Hives, Swelling and Other (See Comments)    SWELLING REACTION UNSPECIFIED    Pravachol  [Pravastatin  Sodium] Shortness Of Breath, Swelling and Anaphylaxis    Throat swelling   Statins Other (See Comments)    Leg cramps   Tape Dermatitis and Other (See Comments)    Steri-Strips   Repatha  [Evolocumab ] Other (See Comments)    Made the stomach hurt badly.   Codeine Other (See Comments) and Hypertension    Increased B/P    Patient Measurements: Height: 5' 4 (162.6 cm) Weight: 73 kg (160 lb 15 oz) IBW/kg (Calculated) : 54.7 HEPARIN  DW (KG): 59.9  Vital Signs: Temp: 98.1 F (36.7 C) (11/25 0800) Temp Source: Oral (11/25 0800) BP: 104/65 (11/25 0815) Pulse Rate: 63 (11/25 0815)  Labs: Recent Labs    11/03/24 0353 11/03/24 1935 11/04/24 0500 11/04/24 1729 11/05/24 0055 11/05/24 0337 11/05/24 0814  HGB 9.2*  --  7.3*  --   --  6.9*  --   HCT 27.5*  --  22.3*  --   --  21.6*  --   PLT 64*  --  87*  --   --  135*  --   HEPARINUNFRC <0.10*  --  <0.10* <0.10* <0.10*  --  <0.10*  CREATININE 1.20* 0.95 0.74 0.60  --   --   --     Estimated Creatinine Clearance: 75.9 mL/min (by C-G formula based on SCr of 0.6 mg/dL).   Assessment: 58 yo F presenting with E. Coli bacteremia 2/2 SBO with perforation. On 11/22 underwent closure of abdomen in OR and repair of left radial artery. Not on AC PTA. Pharmacy consulted for heparin  management.  Received 3 units of platelets 11/22.  Heparin  level <0.1, heparin  not infusing. Hgb down this am but now overt S/Sx bleeding, ok to keep therapeutic heparin  per PCCM.  Goal of Therapy:  Heparin  level 0.3-0.5 units/ml Monitor platelets by anticoagulation protocol: Yes   Plan:  Continue heparin  1000 units/h for now Repeat heparin  level in 6h  Ozell Jamaica, PharmD, [place],  [facility] Clinical Pharmacist [phone] Please check AMION for all [facility] Pharmacy numbers 11/05/2024

## 2024-11-05 NOTE — Progress Notes (Signed)
 eLink Physician-Brief Progress Note Patient Name: Morgan [name] DOB: 1966-10-06 MRN: [id number]   Date of Service  11/05/2024  HPI/Events of Note  Hemoglobin 6.9 gm / dl. No evidence of active bleeding and vitals are stable.  eICU Interventions  Will transfuse one unit of PRBC.        Byanca Kasper U Joevanni Roddey 11/05/2024, 4:49 AM

## 2024-11-06 DIAGNOSIS — E440 Moderate protein-calorie malnutrition: Secondary | ICD-10-CM | POA: Diagnosis not present

## 2024-11-06 DIAGNOSIS — D62 Acute posthemorrhagic anemia: Secondary | ICD-10-CM

## 2024-11-06 DIAGNOSIS — K56609 Unspecified intestinal obstruction, unspecified as to partial versus complete obstruction: Secondary | ICD-10-CM | POA: Diagnosis not present

## 2024-11-06 DIAGNOSIS — E878 Other disorders of electrolyte and fluid balance, not elsewhere classified: Secondary | ICD-10-CM

## 2024-11-06 DIAGNOSIS — I5020 Unspecified systolic (congestive) heart failure: Secondary | ICD-10-CM | POA: Diagnosis not present

## 2024-11-06 LAB — PREPARE FRESH FROZEN PLASMA: Unit division: 0

## 2024-11-06 LAB — BASIC METABOLIC PANEL WITH GFR
Anion gap: 7 (ref 5–15)
BUN: 17 mg/dL (ref 6–20)
CO2: 33 mmol/L — ABNORMAL HIGH (ref 22–32)
Calcium: 7.3 mg/dL — ABNORMAL LOW (ref 8.9–10.3)
Chloride: 97 mmol/L — ABNORMAL LOW (ref 98–111)
Creatinine, Ser: 0.46 mg/dL (ref 0.44–1.00)
GFR, Estimated: >60 mL/min (ref >60)
Glucose, Bld: 116 mg/dL — ABNORMAL HIGH (ref 70–99)
Potassium: 3.6 mmol/L (ref 3.5–5.1)
Sodium: 137 mmol/L (ref 135–145)

## 2024-11-06 LAB — TYPE AND SCREEN
ABO/RH(D): AB POS
Antibody Screen: NEGATIVE
Unit division: 0

## 2024-11-06 LAB — CBC
HCT: 26.8 % — ABNORMAL LOW (ref 36.0–46.0)
Hemoglobin: 8.6 g/dL — ABNORMAL LOW (ref 12.0–15.0)
MCH: 28.6 pg (ref 26.0–34.0)
MCHC: 32.1 g/dL (ref 30.0–36.0)
MCV: 89 fL (ref 80.0–100.0)
Platelets: 199 K/uL (ref 150–400)
RBC: 3.01 MIL/uL — ABNORMAL LOW (ref 3.87–5.11)
RDW: 15.2 % (ref 11.5–15.5)
WBC: 11.3 K/uL — ABNORMAL HIGH (ref 4.0–10.5)
nRBC: 0 % (ref 0.0–0.2)

## 2024-11-06 LAB — GLUCOSE, CAPILLARY
Glucose-Capillary: 116 mg/dL — ABNORMAL HIGH (ref 70–99)
Glucose-Capillary: 141 mg/dL — ABNORMAL HIGH (ref 70–99)
Glucose-Capillary: 120 mg/dL — ABNORMAL HIGH (ref 70–99)
Glucose-Capillary: 162 mg/dL — ABNORMAL HIGH (ref 70–99)
Glucose-Capillary: 115 mg/dL — ABNORMAL HIGH (ref 70–99)
Glucose-Capillary: 134 mg/dL — ABNORMAL HIGH (ref 70–99)

## 2024-11-06 LAB — HEPARIN LEVEL (UNFRACTIONATED)
Heparin Unfractionated: 0.1 [IU]/mL — ABNORMAL LOW (ref 0.30–0.70)
Heparin Unfractionated: 0.18 [IU]/mL — ABNORMAL LOW (ref 0.30–0.70)
Heparin Unfractionated: 0.24 [IU]/mL — ABNORMAL LOW (ref 0.30–0.70)
Heparin Unfractionated: 1.1 [IU]/mL — ABNORMAL HIGH (ref 0.30–0.70)

## 2024-11-06 LAB — MAGNESIUM
Magnesium: 2 mg/dL (ref 1.7–2.4)
Magnesium: 1.6 mg/dL — ABNORMAL LOW (ref 1.7–2.4)

## 2024-11-06 LAB — PHOSPHORUS
Phosphorus: 3.5 mg/dL (ref 2.5–4.6)
Phosphorus: 2.9 mg/dL (ref 2.5–4.6)

## 2024-11-06 LAB — BPAM FFP
Blood Product Expiration Date: 202511252359
Blood Product Expiration Date: 202511252359
Unit Type and Rh: 8400
Unit Type and Rh: 8400

## 2024-11-06 LAB — BPAM RBC
Blood Product Expiration Date: 202512192359
ISSUE DATE / TIME: 202511250539
Unit Type and Rh: 8400

## 2024-11-06 LAB — COOXEMETRY PANEL
Carboxyhemoglobin: 3.6 % — ABNORMAL HIGH (ref 0.5–1.5)
Methemoglobin: 1 % (ref 0.0–1.5)
O2 Saturation: 88.1 %
Total hemoglobin: 9.1 g/dL — ABNORMAL LOW (ref 12.0–16.0)

## 2024-11-06 MED ORDER — POTASSIUM CHLORIDE 20 MEQ PO PACK
40.0000 meq | PACK | Freq: Once | ORAL | Status: AC
  Administered 2024-11-06: 40 meq
  Filled 2024-11-06: qty 2

## 2024-11-06 MED ORDER — FUROSEMIDE 40 MG PO TABS
40.0000 mg | ORAL_TABLET | Freq: Every day | ORAL | Status: DC
  Administered 2024-11-06 – 2024-11-14 (×9): 40 mg
  Filled 2024-11-06 (×9): qty 1

## 2024-11-06 MED ORDER — PROSOURCE TF20 ENFIT COMPATIBL EN LIQD
60.0000 mL | Freq: Every day | ENTERAL | Status: DC
  Administered 2024-11-06 – 2024-11-10 (×5): 60 mL
  Filled 2024-11-06 (×5): qty 60

## 2024-11-06 MED ORDER — SERTRALINE HCL 100 MG PO TABS
200.0000 mg | ORAL_TABLET | Freq: Every day | ORAL | Status: DC
  Administered 2024-11-07 – 2024-11-14 (×8): 200 mg
  Filled 2024-11-06 (×8): qty 2

## 2024-11-06 MED ORDER — VITAL AF 1.2 CAL PO LIQD
1000.0000 mL | ORAL | Status: DC
  Administered 2024-11-06: 1000 mL
  Filled 2024-11-06 (×2): qty 1000

## 2024-11-06 NOTE — Plan of Care (Signed)
   Problem: Education: Goal: Knowledge of General Education information will improve Description: Including pain rating scale, medication(s)/side effects and non-pharmacologic comfort measures Outcome: Progressing   Problem: Health Behavior/Discharge Planning: Goal: Ability to manage health-related needs will improve Outcome: Progressing   Problem: Clinical Measurements: Goal: Ability to maintain clinical measurements within normal limits will improve Outcome: Progressing Goal: Will remain free from infection Outcome: Progressing Goal: Diagnostic test results will improve Outcome: Progressing Goal: Respiratory complications will improve Outcome: Progressing Goal: Cardiovascular complication will be avoided Outcome: Progressing   Problem: Activity: Goal: Risk for activity intolerance will decrease Outcome: Progressing   Problem: Nutrition: Goal: Adequate nutrition will be maintained Outcome: Progressing   Problem: Coping: Goal: Level of anxiety will decrease Outcome: Progressing   Problem: Elimination: Goal: Will not experience complications related to bowel motility Outcome: Progressing Goal: Will not experience complications related to urinary retention Outcome: Progressing   Problem: Pain Managment: Goal: General experience of comfort will improve and/or be controlled Outcome: Progressing   Problem: Safety: Goal: Ability to remain free from injury will improve Outcome: Progressing   Problem: Skin Integrity: Goal: Risk for impaired skin integrity will decrease Outcome: Progressing   Problem: Activity: Goal: Ability to tolerate increased activity will improve Outcome: Progressing   Problem: Respiratory: Goal: Ability to maintain a clear airway and adequate ventilation will improve Outcome: Progressing   Problem: Role Relationship: Goal: Method of communication will improve Outcome: Progressing   Problem: Education: Goal: Ability to describe self-care  measures that may prevent or decrease complications (Diabetes Survival Skills Education) will improve Outcome: Progressing Goal: Individualized Educational Video(s) Outcome: Progressing   Problem: Coping: Goal: Ability to adjust to condition or change in health will improve Outcome: Progressing   Problem: Fluid Volume: Goal: Ability to maintain a balanced intake and output will improve Outcome: Progressing   Problem: Health Behavior/Discharge Planning: Goal: Ability to identify and utilize available resources and services will improve Outcome: Progressing Goal: Ability to manage health-related needs will improve Outcome: Progressing   Problem: Metabolic: Goal: Ability to maintain appropriate glucose levels will improve Outcome: Progressing   Problem: Nutritional: Goal: Maintenance of adequate nutrition will improve Outcome: Progressing Goal: Progress toward achieving an optimal weight will improve Outcome: Progressing   Problem: Skin Integrity: Goal: Risk for impaired skin integrity will decrease Outcome: Progressing   Problem: Tissue Perfusion: Goal: Adequacy of tissue perfusion will improve Outcome: Progressing

## 2024-11-06 NOTE — Progress Notes (Signed)
 PROGRESS NOTE    Morgan [name]  FMW:[id number] DOB: 1965-12-14 DOA: 10/29/2024 PCP: [name], DO   Brief Narrative:  Morgan [name] is a 58 year old female with history of Roux-en-Y gastric bypass who presented to the emergency department with abdominal pain, lactic acidosis and lipase greater than 2800.  She had CCS evaluation and went for exploratory laparotomy.  Found to have dense adhesive band that was obstructing her proximal small bowel as well as biliary leak without evidence of perforation.  She had a gastrostomy tube placed with a wound VAC.  Initially on Levophed , vasopressin , intubated on bicarb drip postoperatively.  Patient's septic shock continue to improve, off pressors, NG tube remains in place, postoperative care per general surgery including diet advancement.  Patient to complete antibiotics on 11/27, completed antifungals 11/25.  Left thumb ischemia status post thrombectomy appears to be stable, vascular surgery following. Patient's chronic comorbid conditions otherwise appear to be improving if not stable.  Assessment & Plan:   Principal Problem:   SBO (small bowel obstruction) s/p lap LOA 03/19/2019 Active Problems:   Malnutrition of moderate degree   Protein-calorie malnutrition, severe  Septic shock due to perforated SBO with peritonitis, E Coli bacteremia SBO due to adhesion Status post ex lap with lysis of adhesions 11/18; washouts on 11/20 and abdominal closure on 11/22.  G-tube in gastric remnant History of Roux-en-Y bariatric surgery - Currently on TPN, tube feeds with clear diet, defer to general surgery for ongoing diet management - zosyn  stop date 11/27 for ecoli bactermia; micafungin  completed 11/25 for peritonitis    Acute HFrEF- concerning given CAD hx vs septic cardiomyopathy  - Off dobutamine , avoid pressors given above - GDMT on hold due to hypotension per heart failure team -start low dose Lasix  - Cardiac cath pending further recovery    Critical left hand ischemia due to thrombosed radial artery, s/p thrombectomy - Left radial arterial line discontinued 11/20, status post radial artery embolectomy with vascular surgery who notes left thumb necrosis distal to MP joint, allow this to declare then plan for amputation. - Continue heparin  drip   Thrombocytopenia due to sepsis, resolved - no need for platelet transfusion; monitor   Acute anemia, recovering - Presumed blood loss vs worsening chronic anemia of chronic disease/aplastic anemia - Hemoglobin improved to 8.6 after transfusion, 1 unit 11/25   Acute hypoxic respiratory failure postoperatively RLL PNA- suspect aspiration from SBO - extubated successfully 11/24 - wean O2 for sat >92%  - IS and mobilize as able   Hypoglycemia  H/o DM2 but  A1c 5.3 and not on meds PTA-- resolved after Roux-en-Y? -monitor; not needing insulin  - watch with stopping steroids   Hypothyroidism - con't synthroid    Acute kidney injury, improving -strict I/O -renally dose meds, avoid nephrotoxic meds   Elevated transaminases, hyperbilirubinemia - stabilizing  - bilirubin back to normal    Depression/Anxiety  - Increase sertraline  back to home dose  Pressure injury noted, POA Wound 10/31/24 1716 Pressure Injury Coccyx Medial Deep Tissue Pressure Injury - Purple or maroon localized area of discolored intact skin or blood-filled blister due to damage of underlying soft tissue from pressure and/or shear. (Active)   DVT prophylaxis: SCD's Start: 10/29/24 2346 continues on heparin  drip Code Status:   Code Status: Full Code Family Communication: None present  Status is: Inpatient  Dispo: The patient is from: Home              Anticipated d/c is to: To be determined  Anticipated d/c date is: To be determined              Patient currently not medically stable for discharge  Consultants:  PCCM, general surgery, vascular surgery  Antimicrobials:  Micafungin   completed Zosyn  stop date 1127  Subjective:   Objective: Vitals:   11/06/24 0200 11/06/24 0300 11/06/24 0326 11/06/24 0400  BP: (!) 91/57 91/70  100/63  Pulse: 65 66  65  Resp: 11 11  12   Temp:   97.8 F (36.6 C)   TempSrc:   Axillary   SpO2: 95% 95%  96%  Weight:      Height:        Intake/Output Summary (Last 24 hours) at 11/06/2024 0713 Last data filed at 11/06/2024 0700 Gross per 24 hour  Intake 2508.25 ml  Output 1005 ml  Net 1503.25 ml   Filed Weights   11/03/24 0500 11/04/24 0500 11/05/24 0700  Weight: 80.1 kg 71.6 kg 73 kg    Examination:  General:  Pleasantly resting in bed, No acute distress. HEENT:  Normocephalic atraumatic.  Sclerae nonicteric, noninjected.  Extraocular movements intact bilaterally. Neck:  Without mass or deformity.  Trachea is midline. Lungs: Diminished without wheeze rales or rhonchi. Heart:  Regular rate and rhythm.  Without murmurs, rubs, or gallops. Abdomen: Nontender, G-tube noted, postoperative bandage clean dry intact Extremities: Without cyanosis, clubbing, edema, or obvious deformity.  Right upper extremity PICC line noted Skin:  Warm and dry, no erythema. Wound 10/31/24 1716 Pressure Injury Coccyx Medial Deep Tissue Pressure Injury - Purple or maroon localized area of discolored intact skin or blood-filled blister due to damage of underlying soft tissue from pressure and/or shear. (Active)    Data Reviewed: I have personally reviewed following labs and imaging studies  CBC: Recent Labs  Lab 10/31/24 0736 10/31/24 0931 11/02/24 1631 11/03/24 0353 11/04/24 0500 11/05/24 0337 11/05/24 1055 11/06/24 0436  WBC 11.6*   < > 12.9* 11.1* 10.4 9.1  --  11.3*  NEUTROABS 10.5*  --   --   --   --   --   --   --   HGB 12.4   < > 9.8* 9.2* 7.3* 6.9* 7.9* 8.6*  HCT 38.0   < > 29.5* 27.5* 22.3* 21.6* 24.7* 26.8*  MCV 88.8   < > 86.0 85.1 87.8 88.9  --  89.0  PLT 45*   < > 84* 64* 87* 135*  --  199   < > = values in this interval  not displayed.   Basic Metabolic Panel: Recent Labs  Lab 11/03/24 1935 11/04/24 0500 11/04/24 1729 11/05/24 0337 11/05/24 1055 11/05/24 1807 11/06/24 0436  NA 135 136 135  --  133*  --  137  K 4.0 2.9* 4.3  --  4.0  --  3.6  CL 96* 94* 90*  --  92*  --  97*  CO2 27 34* 35*  --  34*  --  33*  GLUCOSE 180* 188* 524*  --  414*  --  116*  BUN 36* 31* 27*  --  23*  --  17  CREATININE 0.95 0.74 0.60  --  0.56  --  0.46  CALCIUM  7.2* 7.2* 7.1*  --  7.0*  --  7.3*  MG  --  1.7 2.3 2.0  --  1.8 2.0  PHOS  --  2.6 2.8 2.2*  --  3.0 3.5   GFR: Estimated Creatinine Clearance: 75.9 mL/min (by C-G formula based on SCr of  0.46 mg/dL). Liver Function Tests: Recent Labs  Lab 10/30/24 1141 11/01/24 0450 11/01/24 0920 11/02/24 0355 11/03/24 0353 11/04/24 0500  AST 123* 124*  --  78*  78* 80* 88*  ALT 130* 103*  --  72*  72* 66* 64*  ALKPHOS 48 62  --  44  44 54 48  BILITOT <0.2 0.7 0.7 0.8  0.8 1.3* 0.8  PROT 3.4* 4.3*  --  3.8*  3.8* 4.2* 4.1*  ALBUMIN  2.2* 2.1*  --  <1.5*  <1.5* 1.6* <1.5*   Recent Labs  Lab 10/31/24 0446 11/01/24 0450  LIPASE 78* 22   No results for input(s): AMMONIA in the last 168 hours. Coagulation Profile: Recent Labs  Lab 10/31/24 0945 11/01/24 0450 11/02/24 0147 11/02/24 0355 11/02/24 0853  INR 1.8* 1.0 1.0 0.9 0.9   Cardiac Enzymes: No results for input(s): CKTOTAL, CKMB, CKMBINDEX, TROPONINI in the last 168 hours. BNP (last 3 results) Recent Labs    11/01/24 0920  PROBNP >35,000.0*   HbA1C: No results for input(s): HGBA1C in the last 72 hours. CBG: Recent Labs  Lab 11/05/24 1517 11/05/24 1520 11/05/24 1932 11/05/24 2323 11/06/24 0325  GLUCAP 391* 138* 109* 123* 116*   Lipid Profile: Recent Labs    11/04/24 0500  TRIG 157*   Thyroid  Function Tests: No results for input(s): TSH, T4TOTAL, FREET4, T3FREE, THYROIDAB in the last 72 hours. Anemia Panel: No results for input(s): VITAMINB12, FOLATE,  FERRITIN, TIBC, IRON , RETICCTPCT in the last 72 hours. Sepsis Labs: Recent Labs  Lab 10/31/24 1240 11/01/24 0450 11/01/24 1544 11/02/24 0355  LATICACIDVEN 3.3* 1.7 1.4 1.1    Recent Results (from the past 240 hours)  Culture, blood (Routine x 2)     Status: Abnormal   Collection Time: 10/29/24  5:36 PM   Specimen: BLOOD LEFT WRIST  Result Value Ref Range Status   Specimen Description   Final    BLOOD LEFT WRIST Performed at [facility], [address] [place], [place] [address]    Special Requests   Final    BOTTLES DRAWN AEROBIC ONLY Blood Culture results may not be optimal due to an inadequate volume of blood received in culture bottles Performed at [facility], [address] [place], [place] [address]    Culture  Setup Time   Final    GRAM NEGATIVE RODS AEROBIC BOTTLE ONLY CRITICAL VALUE NOTED.  VALUE IS CONSISTENT WITH PREVIOUSLY REPORTED AND CALLED VALUE.    Culture (A)  Final    ESCHERICHIA COLI SUSCEPTIBILITIES PERFORMED ON PREVIOUS CULTURE WITHIN THE LAST 5 DAYS. Performed at [facility], [address] [place], [place] [address]    Report Status 11/01/2024 FINAL  Final  Culture, blood (Routine x 2)     Status: Abnormal   Collection Time: 10/29/24  5:37 PM   Specimen: BLOOD RIGHT HAND  Result Value Ref Range Status   Specimen Description   Final    BLOOD RIGHT HAND Performed at [facility], [address] [place], [place] [address]    Special Requests   Final    BOTTLES DRAWN AEROBIC ONLY Blood Culture results may not be optimal due to an inadequate volume of blood received in culture bottles Performed at [facility], [address], [place], [place] [address]    Culture  Setup Time   Final    GRAM NEGATIVE RODS AEROBIC BOTTLE ONLY CRITICAL RESULT CALLED TO, READ BACK BY AND VERIFIED WITH:  PHARMD J.LEGGE AT 0810 ON 10/30/2024 BY T.SAAD. Performed at [facility], [address] [place], [place] [address]    Culture ESCHERICHIA COLI (A)  Final   Report Status 11/01/2024 FINAL  Final   Organism ID, Bacteria ESCHERICHIA COLI  Final      Susceptibility   Escherichia coli - MIC*    AMPICILLIN 4 SENSITIVE Sensitive     CEFAZOLIN  (NON-URINE) 2 SENSITIVE Sensitive     CEFEPIME <=0.12 SENSITIVE Sensitive     ERTAPENEM <=0.12 SENSITIVE Sensitive     CEFTRIAXONE  <=0.25 SENSITIVE Sensitive     CIPROFLOXACIN <=0.06 SENSITIVE Sensitive     GENTAMICIN <=1 SENSITIVE Sensitive     MEROPENEM <=0.25 SENSITIVE Sensitive     TRIMETH /SULFA  <=20 SENSITIVE Sensitive     AMPICILLIN/SULBACTAM <=2 SENSITIVE Sensitive     PIP/TAZO Value in next row Sensitive      <=4 SENSITIVEThis is a modified FDA-approved test that has been validated and its performance characteristics determined by the reporting laboratory.  This laboratory is certified under the Clinical Laboratory Improvement Amendments CLIA as qualified to perform high complexity clinical laboratory testing.    * ESCHERICHIA COLI  Blood Culture ID Panel (Reflexed)     Status: Abnormal   Collection Time: 10/29/24  5:37 PM  Result Value Ref Range Status   Enterococcus faecalis NOT DETECTED NOT DETECTED Final   Enterococcus Faecium NOT DETECTED NOT DETECTED Final   Listeria monocytogenes NOT DETECTED NOT DETECTED Final   Staphylococcus species NOT DETECTED NOT DETECTED Final   Staphylococcus aureus (BCID) NOT DETECTED NOT DETECTED Final   Staphylococcus epidermidis NOT DETECTED NOT DETECTED Final   Staphylococcus lugdunensis NOT DETECTED NOT DETECTED Final   Streptococcus species NOT DETECTED NOT DETECTED Final   Streptococcus agalactiae NOT DETECTED NOT DETECTED Final   Streptococcus pneumoniae NOT DETECTED NOT DETECTED Final   Streptococcus pyogenes NOT DETECTED NOT DETECTED Final   A.calcoaceticus-baumannii NOT DETECTED NOT DETECTED Final   Bacteroides fragilis NOT DETECTED NOT DETECTED Final   Enterobacterales DETECTED (A)  NOT DETECTED Final    Comment: Enterobacterales represent a large order of gram negative bacteria, not a single organism. CRITICAL RESULT CALLED TO, READ BACK BY AND VERIFIED WITH: PHARMD J.LEGGE AT 0810 ON 10/30/2024 BY T.SAAD.    Enterobacter cloacae complex NOT DETECTED NOT DETECTED Final   Escherichia coli DETECTED (A) NOT DETECTED Final    Comment: CRITICAL RESULT CALLED TO, READ BACK BY AND VERIFIED WITH: PHARMD J.LEGGE AT 0810 ON 10/30/2024 BY T.SAAD.    Klebsiella aerogenes NOT DETECTED NOT DETECTED Final   Klebsiella oxytoca NOT DETECTED NOT DETECTED Final   Klebsiella pneumoniae NOT DETECTED NOT DETECTED Final   Proteus species NOT DETECTED NOT DETECTED Final   Salmonella species NOT DETECTED NOT DETECTED Final   Serratia marcescens NOT DETECTED NOT DETECTED Final   Haemophilus influenzae NOT DETECTED NOT DETECTED Final   Neisseria meningitidis NOT DETECTED NOT DETECTED Final   Pseudomonas aeruginosa NOT DETECTED NOT DETECTED Final   Stenotrophomonas maltophilia NOT DETECTED NOT DETECTED Final   Candida albicans NOT DETECTED NOT DETECTED Final   Candida auris NOT DETECTED NOT DETECTED Final   Candida glabrata NOT DETECTED NOT DETECTED Final   Candida krusei NOT DETECTED NOT DETECTED Final   Candida parapsilosis NOT DETECTED NOT DETECTED Final   Candida tropicalis NOT DETECTED NOT DETECTED Final   Cryptococcus neoformans/gattii NOT DETECTED NOT DETECTED Final   CTX-M ESBL NOT DETECTED NOT DETECTED Final   Carbapenem resistance IMP NOT  DETECTED NOT DETECTED Final   Carbapenem resistance KPC NOT DETECTED NOT DETECTED Final   Carbapenem resistance NDM NOT DETECTED NOT DETECTED Final   Carbapenem resist OXA 48 LIKE NOT DETECTED NOT DETECTED Final   Carbapenem resistance VIM NOT DETECTED NOT DETECTED Final    Comment: Performed at [facility], [address], [place], [place] [address]  MRSA Next Gen by PCR, Nasal     Status: None   Collection Time: 11/02/24  8:54 AM    Specimen: Nasal Mucosa; Nasal Swab  Result Value Ref Range Status   MRSA by PCR Next Gen NOT DETECTED NOT DETECTED Final    Comment: (NOTE) The GeneXpert MRSA Assay (FDA approved for NASAL specimens only), is one component of a comprehensive MRSA colonization surveillance program. It is not intended to diagnose MRSA infection nor to guide or monitor treatment for MRSA infections. Test performance is not FDA approved in patients less than 20 years old. Performed at [facility], [address] [place], [place] [address]          Radiology Studies: No results found.      Scheduled Meds:  acetaminophen   650 mg Per Tube Q6H   Chlorhexidine  Gluconate Cloth  6 each Topical Daily   insulin  aspart  0-15 Units Subcutaneous Q4H   levothyroxine   112 mcg Per Tube QAC breakfast   mouth rinse  15 mL Mouth Rinse 4 times per day   pantoprazole  (PROTONIX ) IV  40 mg Intravenous Q12H   sertraline   100 mg Per Tube Daily   spironolactone   12.5 mg Per Tube Daily   thiamine  (VITAMIN B1) injection  100 mg Intravenous Q24H   zinc  sulfate (50mg  elemental zinc )  220 mg Per Tube Daily   Continuous Infusions:  copper  chloride 2 mg in dextrose  5 % 100 mL IVPB Stopped (11/05/24 1127)   feeding supplement (VITAL AF 1.2 CAL) 30 mL/hr at 11/06/24 0700   heparin  1,300 Units/hr (11/06/24 0700)   piperacillin -tazobactam (ZOSYN )  IV 12.5 mL/hr at 11/06/24 0700   TPN ADULT (ION) 30 mL/hr at 11/06/24 0700     LOS: 8 days   Time spent:  Elsie JAYSON Montclair, DO [facility]  If 7PM-7AM, please contact night-coverage [email or web address]  11/06/2024, 7:13 AM

## 2024-11-06 NOTE — Progress Notes (Signed)
 PHARMACY - ANTICOAGULATION CONSULT NOTE  Pharmacy Consult for heparin  infusion Indication: acute radial artery occlusion/ischemic L hand  Patient Measurements: Height: 5' 4 (162.6 cm) Weight: 73 kg (160 lb 15 oz) IBW/kg (Calculated) : 54.7 HEPARIN  DW (KG): 59.9  Vital Signs: Temp: 97.7 F (36.5 C) (11/26 1900) Temp Source: Oral (11/26 1900) BP: 90/54 (11/26 2000) Pulse Rate: 75 (11/26 2000)  Labs: Recent Labs    11/04/24 0500 11/04/24 1729 11/05/24 0055 11/05/24 0337 11/05/24 0814 11/05/24 1055 11/05/24 1526 11/06/24 0436 11/06/24 0824 11/06/24 1600 11/06/24 1934  HGB 7.3*  --   --  6.9*  --  7.9*  --  8.6*  --   --   --   HCT 22.3*  --   --  21.6*  --  24.7*  --  26.8*  --   --   --   PLT 87*  --   --  135*  --   --   --  199  --   --   --   HEPARINUNFRC <0.10* <0.10*   < >  --    < >  --    < >  --  0.24* 1.10* 0.18*  CREATININE 0.74 0.60  --   --   --  0.56  --  0.46  --   --   --    < > = values in this interval not displayed.    Estimated Creatinine Clearance: 75.9 mL/min (by C-G formula based on SCr of 0.46 mg/dL).   Assessment: 58 yo F presenting with E. Coli bacteremia 2/2 SBO with perforation. On 11/22 underwent closure of abdomen in OR and repair of left radial artery. Not on AC PTA. Pharmacy consulted for heparin  management.  Received 3 units of platelets 11/22.  11/26 PM Heparin  level is subtherapeutic at 0.18, on 1400 units/hr. Hgb 8.6, plt 199. No s/sx of bleeding or infusion issues. (Lab drawn at 1600 falsely elevated, likely drawn from heparin  infusion line)  Goal of Therapy:  Heparin  level 0.3-0.5 units/ml Monitor platelets by anticoagulation protocol: Yes   Plan:  Increase heparin  infusion to 1500 units/hr Repeat heparin  level with AM labs Monitor daily HL, CBC, and for s/sx of bleeding   Thank you for allowing pharmacy to participate in this patient's care,  Larraine Brazier, PharmD Clinical Pharmacist 11/06/2024  8:52 PM **Pharmacist  phone directory can now be found on [email or web address] ([email or web address]).  Listed under [facility].

## 2024-11-06 NOTE — Progress Notes (Signed)
 Progress Note  4 Days Post-Op  Subjective: Pt denies nausea with trickle TF, some cramping, endorses flatus but no BM yet.    Objective: Vital signs in last 24 hours: Temp:  [97.4 F (36.3 C)-98.2 F (36.8 C)] 97.8 F (36.6 C) (11/26 0326) Pulse Rate:  [61-194] 72 (11/26 0700) Resp:  [8-18] 17 (11/26 0700) BP: (89-112)/(57-82) 93/59 (11/26 0700) SpO2:  [90 %-100 %] 94 % (11/26 0700) Last BM Date :  (PTA)  Intake/Output from previous day: 11/25 0701 - 11/26 0700 In: 2508.3 [I.V.:1001.4; Blood:315; NG/GT:682.8; IV Piggyback:509.1] Out: 1005 [Urine:1005] Intake/Output this shift: No intake/output data recorded.  PE: General: pleasant, WD female who is laying in bed in NAD HEENT: sclera anicteric Heart: regular, rate, and rhythm.   Lungs: Respiratory effort nonlabored Abd: soft, appropriately ttp, G-tube present with sutures in place, midline wound with vac in place good seal, photo below from 11/25, ND     Lab Results:  Recent Labs    11/05/24 0337 11/05/24 1055 11/06/24 0436  WBC 9.1  --  11.3*  HGB 6.9* 7.9* 8.6*  HCT 21.6* 24.7* 26.8*  PLT 135*  --  199   BMET Recent Labs    11/05/24 1055 11/06/24 0436  NA 133* 137  K 4.0 3.6  CL 92* 97*  CO2 34* 33*  GLUCOSE 414* 116*  BUN 23* 17  CREATININE 0.56 0.46  CALCIUM  7.0* 7.3*   PT/INR No results for input(s): LABPROT, INR in the last 72 hours. CMP     Component Value Date/Time   NA 137 11/06/2024 0436   NA 142 09/23/2024 1030   K 3.6 11/06/2024 0436   CL 97 (L) 11/06/2024 0436   CO2 33 (H) 11/06/2024 0436   GLUCOSE 116 (H) 11/06/2024 0436   BUN 17 11/06/2024 0436   BUN 17 09/23/2024 1030   CREATININE 0.46 11/06/2024 0436   CREATININE 0.82 06/18/2013 1009   CALCIUM  7.3 (L) 11/06/2024 0436   PROT 4.1 (L) 11/04/2024 0500   PROT 6.5 06/28/2024 0842   ALBUMIN  <1.5 (L) 11/04/2024 0500   ALBUMIN  4.5 06/28/2024 0842   AST 88 (H) 11/04/2024 0500   ALT 64 (H) 11/04/2024 0500   ALKPHOS 48  11/04/2024 0500   BILITOT 0.8 11/04/2024 0500   BILITOT 0.3 06/28/2024 0842   GFRNONAA >60 11/06/2024 0436   GFRNONAA 87 06/18/2013 1009   GFRAA >60 08/04/2020 1038   GFRAA >89 06/18/2013 1009   Lipase     Component Value Date/Time   LIPASE 22 11/01/2024 0450       Studies/Results: No results found.  Anti-infectives: Anti-infectives (From admission, onward)    Start     Dose/Rate Route Frequency Ordered Stop   10/31/24 1400  piperacillin -tazobactam (ZOSYN ) IVPB 3.375 g        3.375 g 12.5 mL/hr over 240 Minutes Intravenous Every 8 hours 10/31/24 0751 11/07/24 1359   10/31/24 1100  micafungin  (MYCAMINE ) 100 mg in sodium chloride  0.9 % 100 mL IVPB        100 mg 105 mL/hr over 1 Hours Intravenous Every 24 hours 10/31/24 0901 11/04/24 1354   10/30/24 0600  piperacillin -tazobactam (ZOSYN ) IVPB 3.375 g  Status:  Discontinued        3.375 g 12.5 mL/hr over 240 Minutes Intravenous Every 8 hours 10/29/24 2350 10/31/24 0751   10/29/24 2145  piperacillin -tazobactam (ZOSYN ) IVPB 3.375 g        3.375 g 100 mL/hr over 30 Minutes Intravenous  Once 10/29/24 2137  10/29/24 2205   10/29/24 1700  cefTRIAXone  (ROCEPHIN ) 1 g in sodium chloride  0.9 % 100 mL IVPB  Status:  Discontinued        1 g 200 mL/hr over 30 Minutes Intravenous  Once 10/29/24 1646 10/29/24 1656   10/29/24 1700  metroNIDAZOLE  (FLAGYL ) IVPB 500 mg        500 mg 100 mL/hr over 60 Minutes Intravenous  Once 10/29/24 1646 10/29/24 1809   10/29/24 1700  cefTRIAXone  (ROCEPHIN ) 2 g in sodium chloride  0.9 % 100 mL IVPB        2 g 200 mL/hr over 30 Minutes Intravenous  Once 10/29/24 1656 10/29/24 1733        Assessment/Plan POD 8, s/p ex lap with LOA, g-tube placement in gastric remnant Dr. Vernetta 11/18 secondary to SBO with possible perforation POD 6, s/p ex lap with washout, Dr. Tanda 11/20 POD 4, s/p washout, abdominal closure Dr. Tanda 11/22 - cont zosyn , BC 2/4 positive for E coli.  Completed antifungal course and  completed stress dose steroid course - continue VAC T/F - hgb 8.6 this am, rec'd 1 U PRBC 11/25   FEN - NPO/IVFs per CCM, increase TF to 19mL/hr and add prosource per RD recs. Ice chips only PO per SLP VTE - heparin  gtt ID - micafungin  5 doses completed; Zosyn  11/20>>   - per CCM -  E coli bacteremia - continue zosyn  Septic shock - off pressors, improving  CAD/Heart failure - EF 20-25% on echo 11/21 AKI - resolved DM - SSI Leukopenia - resolved, secondary to sepsis Thrombocytopenia - resolved Hypocalcemia - per CCM Neck wound - unclear etiology, have asked nursing to place mepilex over this to protect it.  Avoid dressings or lines in this area. L hand ischemic changes -  VVS following, s/p embolectomy radial artery and repair, likely will need more surgery. Hand surgery consulted     LOS: 8 days   Morgan DELENA Freund, MD  [facility] 11/06/2024, 7:45 AM Please see Amion for pager number during day hours 7:00am-4:30pm

## 2024-11-06 NOTE — Progress Notes (Signed)
 Physical Therapy Treatment Patient Details Name: Morgan [name] MRN: [id number] DOB: 1966/06/04 Today's Date: 11/06/2024   History of Present Illness Pt is 58 yo presenting to [facility] 11/18. Transferred to [facility] Pt currently s/p exploratory laparotomy with g-tube placement on 11/18 due to SBO with possible perforation. Exploratory laparotomy repeat on 11/22 wound vac placed. Pt intubated on 11/18 until 11/24.Underwent L radial artery embolectomy 11/22.  PMH: SBO, roux-en-Y gastric bypass, DM, HTN, CAP, OSA.    PT Comments  Pt is progressing towards goals. Currently pt is Mod A for bed mobility and 2 person Mod A for sit to stand with 2 person HHA for step pivot transfers. Pt was able to stand for ~ 3 min and march in place by recliner prior to sitting. Pt is motivated to participate and has good family support. Due to pt current functional status, home set up and available assistance at home recommending skilled physical therapy services > 3 hours/day in order to address strength, balance and functional mobility to decrease risk for falls, injury, immobility, skin break down and re-hospitalization.      If plan is discharge home, recommend the following: A little help with walking and/or transfers;Assist for transportation;Assistance with cooking/housework;Help with stairs or ramp for entrance     Equipment Recommendations  Hospital bed;Wheelchair cushion (measurements PT);Wheelchair (measurements PT);Rolling walker (2 wheels)       Precautions / Restrictions Precautions Precautions: Fall Recall of Precautions/Restrictions: Impaired Precaution/Restrictions Comments: abdominal wound vac, g-tube, foley, PICC, O2 Restrictions Other Position/Activity Restrictions: did not weight bear through LUE, however no formal orders from embolectomy     Mobility  Bed Mobility Overal bed mobility: Needs Assistance Bed Mobility: Sidelying to Sit   Sidelying to sit: Mod assist             Transfers Overall transfer level: Needs assistance Equipment used: 2 person hand held assist Transfers: Sit to/from Stand, Bed to chair/wheelchair/BSC Sit to Stand: Mod assist, +2 physical assistance   Step pivot transfers: Mod assist, +2 physical assistance            Ambulation/Gait     Pre-gait activities: pt able to march in place with 2 person Min A for balance standing at recliner. Pt able to take steps from EOB to recliner with 2 person  HHA.        Balance Overall balance assessment: Needs assistance Sitting-balance support: Bilateral upper extremity supported, Feet supported Sitting balance-Leahy Scale: Poor Sitting balance - Comments: min A at EOB for posterior lean Postural control: Posterior lean   Standing balance-Leahy Scale: Poor Standing balance comment: reliant on external support      Communication Communication Communication: Impaired Factors Affecting Communication: Difficulty expressing self  Cognition Arousal: Alert Behavior During Therapy: Flat affect   PT - Cognitive impairments: Sequencing, Problem solving, Safety/Judgement, Awareness, Memory, Orientation, No family/caregiver present to determine baseline   Orientation impairments: Place, Time     Following commands: Impaired Following commands impaired: Follows one step commands with increased time    Cueing Cueing Techniques: Verbal cues, Tactile cues, Visual cues     General Comments General comments (skin integrity, edema, etc.): vital signs stable on 1 L O2 via [place]      Pertinent Vitals/Pain Pain Assessment Pain Assessment: No/denies pain     PT Goals (current goals can now be found in the care plan section) Acute Rehab PT Goals PT Goal Formulation: Patient unable to participate in goal setting Time For Goal Achievement: 11/18/24 Potential to Achieve  Goals: Fair Progress towards PT goals: Progressing toward goals    Frequency    Min 2X/week      PT Plan   Continue with current POC     Co-evaluation PT/OT/SLP Co-Evaluation/Treatment: Yes Reason for Co-Treatment: Complexity of the patient's impairments (multi-system involvement);Necessary to address cognition/behavior during functional activity;For patient/therapist safety;To address functional/ADL transfers PT goals addressed during session: Mobility/safety with mobility;Balance OT goals addressed during session: ADL's and self-care      AM-PAC PT 6 Clicks Mobility   Outcome Measure  Help needed turning from your back to your side while in a flat bed without using bedrails?: A Lot Help needed moving from lying on your back to sitting on the side of a flat bed without using bedrails?: A Lot Help needed moving to and from a bed to a chair (including a wheelchair)?: Total Help needed standing up from a chair using your arms (e.g., wheelchair or bedside chair)?: Total Help needed to walk in hospital room?: Total Help needed climbing 3-5 steps with a railing? : Total 6 Click Score: 8    End of Session Equipment Utilized During Treatment: Gait belt Activity Tolerance: Patient limited by fatigue;Patient tolerated treatment well Patient left: in chair;with call bell/phone within reach;with nursing/sitter in room Nurse Communication: Mobility status PT Visit Diagnosis: Other abnormalities of gait and mobility (R26.89);Muscle weakness (generalized) (M62.81)     Time: 8455-8384 PT Time Calculation (min) (ACUTE ONLY): 31 min  Charges:    $Therapeutic Activity: 8-22 mins PT General Charges $$ ACUTE PT VISIT: 1 Visit                     Dorothyann Maier, DPT, CLT  Acute Rehabilitation Services Office: [phone] (Secure chat preferred)    [name] 11/06/2024, 5:15 PM

## 2024-11-06 NOTE — Progress Notes (Signed)
   Inpatient Rehabilitation Admissions Coordinator   I will a place consult to assess for candidacy for CIR admit.  Heron Leavell, RN, MSN Rehab Admissions Coordinator [phone] 11/06/2024 5:23 PM

## 2024-11-06 NOTE — TOC Initial Note (Signed)
 Transition of Care [facility]) - Initial/Assessment Note    Patient Details  Name: Morgan [name] MRN: [id number] Date of Birth: [birth year]  Transition of Care [facility]) CM/SW Contact:    Arlana JINNY Moats, LCSWA Phone [phone] 11/06/2024, 1:13 PM  Clinical Narrative:   HF CSW called and spoke to [facility])  over the phone. Harlene stated that the patient lives with her great nephew. Harlene stated that she has history of HH services, but not current. Harlene stated that patient uses a walker. Harlene stated that she is unsure if they have a scale at home. Harlene stated that the patient has a PCP. CSW explained that a hospital follow up appointment typically scheduled closer towards dc. Harlene is agreeable. Harlene stated that the patient still drives. Harlene stated that family will provide transportation at dc.   HF CSW/CM will continue to follow and monitor for dc readiness.               Expected Discharge Plan: Home/Self Care Barriers to Discharge: Continued Medical Work up   Patient Goals and CMS Choice Patient states their goals for this hospitalization and ongoing recovery are:: Home CMS Medicare.gov Compare Post Acute Care list provided to:: Patient Represenative (must comment) (Jessica(HCPOA, niece)) Choice offered to / list presented to : [facility] POA / Guardian [place] ownership interest in [facility].provided to:: [facility] POA / Guardian    Expected Discharge Plan and Services In-house Referral:  (rw) Discharge Planning Services: CM Consult   Living arrangements for the past 2 months: Single Family Home                                      Prior Living Arrangements/Services Living arrangements for the past 2 months: Single Family Home Lives with:: Relatives              Current home services: DME    Activities of Daily Living   ADL Screening (condition at time of admission) Independently performs ADLs?: Yes (appropriate for  developmental age) Is the patient deaf or have difficulty hearing?: No Does the patient have difficulty seeing, even when wearing glasses/contacts?: No Does the patient have difficulty concentrating, remembering, or making decisions?: No  Permission Sought/Granted                  Emotional Assessment              Admission diagnosis:  SBO (small bowel obstruction) (HCC) [K56.609] Patient Active Problem List   Diagnosis Date Noted   Protein-calorie malnutrition, severe 11/05/2024   Malnutrition of moderate degree 10/30/2024   Spondylolysis of lumbar region 09/09/2022   Osteoarthritis of left shoulder 08/11/2020   S/P shoulder replacement, left 08/11/2020   AMS (altered mental status) 05/19/2019   SBO (small bowel obstruction) s/p lap LOA 03/19/2019 03/19/2019   Reactive hypoglycemia 02/12/2019   Chronic migraine without aura, with intractable migraine, so stated, with status migrainosus 11/20/2018   Lumbar radiculopathy 10/11/2018   Atelectasis    Hypoglycemia    Hypotension    Bradycardia    Epigastric pain 06/19/2018   History of adenomatous polyp of colon 10/23/2017   Hyperlipidemia associated with type 2 diabetes mellitus (HCC) 07/06/2017   GERD (gastroesophageal reflux disease) 03/21/2017   History of Roux-en-Y gastric bypass 2018 03/21/2017   Intractable chronic migraine without aura 06/04/2015   Abnormal uterine bleeding 05/13/2015   Pancreatitis 02/27/2014  Fatty liver disease, nonalcoholic 02/25/2014   Metabolic syndrome 02/20/2014   Vitamin D  deficiency    DM (diabetes mellitus) (HCC) 03/12/2013   Surgery, elective 06/12/2011   Hypothyroidism    Constipation, chronic    Vitamin B 12 deficiency    Allergic rhinitis 12/31/2010   BARRETTS ESOPHAGUS 12/31/2010   Gastroparesis 12/31/2010   Bilateral chronic knee pain 12/31/2010   Obstructive sleep apnea 09/27/2010   Migraine 09/27/2010   Cyst of ovary 10/19/2009   PCP:  [name],  DO Pharmacy:   [facility] [place], [place] - [address] [place] [address] Phone: [phone] Fax: [phone]     Social Drivers of Health (SDOH) Social History: SDOH Screenings   Food Insecurity: Patient Unable To Answer (11/02/2024)  Housing: Patient Unable To Answer (11/02/2024)  Transportation Needs: Patient Unable To Answer (11/02/2024)  Utilities: Patient Unable To Answer (11/02/2024)  Alcohol  Screen: Low Risk  (09/23/2024)  Depression (PHQ2-9): Medium Risk (06/28/2024)  Financial Resource Strain: Medium Risk (09/23/2024)  Physical Activity: Insufficiently Active (09/23/2024)  Social Connections: Patient Unable To Answer (11/02/2024)  Recent Concern: Social Connections - Moderately Isolated (09/23/2024)  Stress: Stress Concern Present (09/23/2024)  Tobacco Use: Low Risk  (10/29/2024)  Health Literacy: Adequate Health Literacy (09/23/2024)   SDOH Interventions:     Readmission Risk Interventions     No data to display

## 2024-11-06 NOTE — Progress Notes (Addendum)
 Advanced Heart Failure Rounding Note  Patient Profile:    58 y/o female admitted w/ SBO and septic shock d/t e.coli bacteremia, s/p exploratory laparotomy w/ lysis of adhesions and placement of gastrostomy tube. Post op course c/b new systolic heart failure w/ CS and acute radial artery occlusion/ischemic left hand requiring radial artery embolectomy.   Subjective:    11/22 To OR after ab wound closure and revasc of LUE   Off DBA. Co-ox stable 88%.   CVP 5 and Net + 1.5L yesterday.   Feeling better. Denies dyspnea. No CP.   POCUS ECHO EF 30-35% with RWMA    Objective:   Weight Range:  Vital Signs:   Temp:  [97.4 F (36.3 C)-98.2 F (36.8 C)] 97.9 F (36.6 C) (11/26 0801) Pulse Rate:  [61-194] 70 (11/26 0800) Resp:  [8-17] 16 (11/26 0800) BP: (89-112)/(57-82) 98/62 (11/26 0800) SpO2:  [90 %-100 %] 95 % (11/26 0800) Last BM Date :  (PTA)  Weight change: Filed Weights   11/03/24 0500 11/04/24 0500 11/05/24 0700  Weight: 80.1 kg 71.6 kg 73 kg    Intake/Output:   Intake/Output Summary (Last 24 hours) at 11/06/2024 1000 Last data filed at 11/06/2024 0700 Gross per 24 hour  Intake 1927.35 ml  Output 1005 ml  Net 922.35 ml     Physical Exam: CVP 5  GENERAL: fatigued appearing, NAD Lungs- clear CARDIAC:  JVP 6 cm          Normal rate with regular rhythm. No MRG.  ABDOMEN: Soft, non-tender, non-distended.  EXTREMITIES: Warm and well perfused. Trace b/l LEE  NEUROLOGIC: No obvious FND   Telemetry: NSR 70s Personally reviewed  Labs: Basic Metabolic Panel: Recent Labs  Lab 11/03/24 1935 11/04/24 0500 11/04/24 1729 11/05/24 0337 11/05/24 1055 11/05/24 1807 11/06/24 0436  NA 135 136 135  --  133*  --  137  K 4.0 2.9* 4.3  --  4.0  --  3.6  CL 96* 94* 90*  --  92*  --  97*  CO2 27 34* 35*  --  34*  --  33*  GLUCOSE 180* 188* 524*  --  414*  --  116*  BUN 36* 31* 27*  --  23*  --  17  CREATININE 0.95 0.74 0.60  --  0.56  --  0.46  CALCIUM  7.2* 7.2*  7.1*  --  7.0*  --  7.3*  MG  --  1.7 2.3 2.0  --  1.8 2.0  PHOS  --  2.6 2.8 2.2*  --  3.0 3.5    Liver Function Tests: Recent Labs  Lab 10/30/24 1141 11/01/24 0450 11/01/24 0920 11/02/24 0355 11/03/24 0353 11/04/24 0500  AST 123* 124*  --  78*  78* 80* 88*  ALT 130* 103*  --  72*  72* 66* 64*  ALKPHOS 48 62  --  44  44 54 48  BILITOT <0.2 0.7 0.7 0.8  0.8 1.3* 0.8  PROT 3.4* 4.3*  --  3.8*  3.8* 4.2* 4.1*  ALBUMIN  2.2* 2.1*  --  <1.5*  <1.5* 1.6* <1.5*   Recent Labs  Lab 10/31/24 0446 11/01/24 0450  LIPASE 78* 22   No results for input(s): AMMONIA in the last 168 hours.  CBC: Recent Labs  Lab 10/31/24 0736 10/31/24 0931 11/02/24 1631 11/03/24 0353 11/04/24 0500 11/05/24 0337 11/05/24 1055 11/06/24 0436  WBC 11.6*   < > 12.9* 11.1* 10.4 9.1  --  11.3*  NEUTROABS 10.5*  --   --   --   --   --   --   --  HGB 12.4   < > 9.8* 9.2* 7.3* 6.9* 7.9* 8.6*  HCT 38.0   < > 29.5* 27.5* 22.3* 21.6* 24.7* 26.8*  MCV 88.8   < > 86.0 85.1 87.8 88.9  --  89.0  PLT 45*   < > 84* 64* 87* 135*  --  199   < > = values in this interval not displayed.    Cardiac Enzymes: No results for input(s): CKTOTAL, CKMB, CKMBINDEX, TROPONINI in the last 168 hours.  BNP: BNP (last 3 results) No results for input(s): BNP in the last 8760 hours.  ProBNP (last 3 results) Recent Labs    11/01/24 0920  PROBNP >35,000.0*      Other results:  Imaging: No results found.    Medications:     Scheduled Medications:  acetaminophen   650 mg Per Tube Q6H   Chlorhexidine  Gluconate Cloth  6 each Topical Daily   feeding supplement (PROSource TF20)  60 mL Per Tube Daily   feeding supplement (VITAL AF 1.2 CAL)  1,000 mL Per Tube Q24H   insulin  aspart  0-15 Units Subcutaneous Q4H   levothyroxine   112 mcg Per Tube QAC breakfast   mouth rinse  15 mL Mouth Rinse 4 times per day   pantoprazole  (PROTONIX ) IV  40 mg Intravenous Q12H   sertraline   100 mg Per Tube Daily    spironolactone   12.5 mg Per Tube Daily   thiamine  (VITAMIN B1) injection  100 mg Intravenous Q24H   zinc  sulfate (50mg  elemental zinc )  220 mg Per Tube Daily    Infusions:  copper  chloride 2 mg in dextrose  5 % 100 mL IVPB Stopped (11/05/24 1127)   heparin  1,300 Units/hr (11/06/24 0700)   piperacillin -tazobactam (ZOSYN )  IV 12.5 mL/hr at 11/06/24 0700   TPN ADULT (ION) 30 mL/hr at 11/06/24 0700    PRN Medications: HYDROmorphone  (DILAUDID ) injection, mouth rinse, oxyCODONE , sodium chloride  flush   Assessment:   1. Septic shock with e.coli bacteremia +/- component of cardiogenic shock New Cardiomyopathy -  - Patient was admitted with small bowel obstruction and septic shock. Patient underwent exploratory laparotomy exploratory laparotomy was found to have a densities at the end obstructing the proximal small bowel near Roux-en-Y anastomosis.   - Bcx + for e.coli - abx and stress-dose steroids per CCM - ab closure on 11/22 - Echo showed LVEF of 20-25% with multiple wall motion abnormalities  - POCUS ECHO 11/22 EF ~30-35% with RWMA concerning for underlying CAD - Co-ox stable off DBA, 88%  - Mild volume overload on exam, start po Lasix  40 mg daily  - Continue spiro 12.5 mg daily  - BMP too soft for additional GDMT  - May need cath prior to d/c if EF not improving (pending improvement of thrombocytopenia and overall condition). Would do cath femorally  2. Acute systolic HF - plan as above.  - co-ox stable off support - start po Lasix  40 mg daily  - Continue Spiro 12.5 mg daily    3. Acute hypoxic resp failure - extubated  - stable on 2L [place]   4. Non-Obstructive CAD - Coronary CTA in 09/2024 showed coronary calcium  score of 315 (90th percentile for age and sex) and mild mixed nonobstructive CAD with 1-24% stenosis of the proximal RCA and 25-49% stenosis of LAD. - No Aspirin  given severe thrombocytopenia.  - Intolerant to statins in the past.  - Echo concerning for new RWMA. Will  need to consider cath prior to d/c   5. Ischemic Left  Hand - VVS following. S/p revasc 11/22   6. Severe thrombocytopenia - in setting of sepsis, now improved   - PLTS 8k -> 64->87->135->199K   9. Anemia: -improved post transfusion, Hgb 8.6 today    Length of Stay: 8   Brittainy Simmons PA-C  11/06/2024, 10:00 AM  Advanced Heart Failure Team Pager [phone] (M-F; 7a - 4p)  Please contact [facility] for night-coverage after hours (4p -7a ) and weekends on [email or web address]   Patient seen and examined with the above-signed Advanced Practice Provider and/or Housestaff. I personally reviewed laboratory data, imaging studies and relevant notes. I independently examined the patient and formulated the important aspects of the plan. I have edited the note to reflect any of my changes or salient points. I have personally discussed the plan with the patient and/or family.  Co-ox stable of DBA. Feels weak. Denies CP or SOB. No CP   CVP 5  On TPN  General:  Sitting up in bed. No resp difficulty HEENT: normal Neck: supple. no JVD.  Cor: Regular rate & rhythm. No rubs, gallops or murmurs. Lungs: clear Abdomen: soft, nontender, nondistended.+ wound vac Extremities: no cyanosis, clubbing, rash, edema left thumb wrapped Neuro: alert & orientedx3, cranial nerves grossly intact. moves all 4 extremities w/o difficulty. Affect pleasant  Will start low-dose po lasix . BP too soft for additional GDMT at this point. Would consider cardiac cath prior to d/c (via femoral approach) give persistent RWMA.  We will see again Friday.   Toribio Fuel, MD  12:41 PM

## 2024-11-06 NOTE — Progress Notes (Addendum)
 Occupational Therapy Treatment Patient Details Name: Morgan [name] MRN: [id number] DOB: 11/07/1966 Today's Date: 11/06/2024   History of present illness Pt is 58 yo presenting to [facility] 11/18. Transferred to [facility]. Pt currently s/p exploratory laparotomy with g-tube placement on 11/18 due to SBO with possible perforation. Exploratory laparotomy repeat on 11/22 wound vac placed. Pt intubated on 11/18 until 11/24.Underwent L radial artery embolectomy 11/22.  PMH: SBO, roux-en-Y gastric bypass, DM, HTN, CAP, OSA.   OT comments  Pt progressing toward goals this session, overall needs min-mod A for ADLs, incontinent of BM and urine upon arrival needing total A for bed level pericare. Pt needs mod A for bed mobility and mod +2 for transfers with 2 person HHA. Pt able to perform LUE therex seated in chair, and educated on continued mobility and elevation, but will benefit from reinforcement. Pt desatting to 83% on RA, incr to 90s on 1L O2. Pt presenting with impairments listed below, will follow acutely. Patient will benefit from intensive inpatient follow-up therapy, >3 hours/day to maximize safety/ind with ADL/functional mobility.       If plan is discharge home, recommend the following:  Two people to help with walking and/or transfers;A lot of help with bathing/dressing/bathroom;Assistance with cooking/housework;Direct supervision/assist for medications management;Direct supervision/assist for financial management;Assist for transportation;Help with stairs or ramp for entrance   Equipment Recommendations  Other (comment) (defer)    Recommendations for Other Services PT consult;Rehab consult    Precautions / Restrictions Precautions Precautions: Fall Recall of Precautions/Restrictions: Impaired Precaution/Restrictions Comments: abdominal wound vac, g-tube, foley, PICC, O2 Restrictions Other Position/Activity Restrictions: did not weight bear through LUE, however no formal orders from embolectomy        Mobility Bed Mobility Overal bed mobility: Needs Assistance Bed Mobility: Sidelying to Sit   Sidelying to sit: Mod assist            Transfers Overall transfer level: Needs assistance Equipment used: 2 person hand held assist Transfers: Sit to/from Stand, Bed to chair/wheelchair/BSC Sit to Stand: Mod assist, +2 physical assistance     Step pivot transfers: Mod assist, +2 physical assistance           Balance Overall balance assessment: Needs assistance Sitting-balance support: Bilateral upper extremity supported, Feet supported Sitting balance-Leahy Scale: Poor Sitting balance - Comments: min A at EOB for posterior lean Postural control: Posterior lean   Standing balance-Leahy Scale: Poor Standing balance comment: reliant on external support                           ADL either performed or assessed with clinical judgement   ADL Overall ADL's : Needs assistance/impaired                 Upper Body Dressing : Minimal assistance;Sitting       Toilet Transfer: Moderate assistance;+2 for physical assistance;Stand-pivot           Functional mobility during ADLs: Moderate assistance;+2 for physical assistance      Extremity/Trunk Assessment Upper Extremity Assessment Upper Extremity Assessment: Generalized weakness;LUE deficits/detail LUE Deficits / Details: thumb wrapped in gauze bandage, limited flexion at DIP joint, and weak abduction of digits, 3/5 grasp LUE Coordination: decreased fine motor;decreased gross motor   Lower Extremity Assessment Lower Extremity Assessment: Defer to PT evaluation        Vision       Perception Perception Perception: Not tested   Praxis Praxis Praxis: Not tested   Communication  Communication Communication: Impaired Factors Affecting Communication: Difficulty expressing self   Cognition Arousal: Alert Behavior During Therapy: Flat affect Cognition: Cognition impaired   Orientation  impairments: Situation, Time Awareness: Online awareness impaired     Executive functioning impairment (select all impairments): Problem solving                   Following commands: Impaired Following commands impaired: Follows one step commands with increased time      Cueing   Cueing Techniques: Verbal cues, Tactile cues, Visual cues  Exercises Exercises: Other exercises Other Exercises Other Exercises: pink foam cube squeeze x5 Other Exercises: L hand digit adduction x5 AAROM Other Exercises: L hand digit abduction x5    Shoulder Instructions       General Comments VSS on 1L o2    Pertinent Vitals/ Pain       Pain Assessment Pain Assessment: No/denies pain  Home Living                                          Prior Functioning/Environment              Frequency  Min 2X/week        Progress Toward Goals  OT Goals(current goals can now be found in the care plan section)  Progress towards OT goals: Progressing toward goals  Acute Rehab OT Goals Patient Stated Goal: did not state OT Goal Formulation: With patient Time For Goal Achievement: 11/18/24 Potential to Achieve Goals: Good ADL Goals Pt Will Perform Upper Body Dressing: with supervision;sitting Pt Will Perform Lower Body Dressing: with min assist;sitting/lateral leans;sit to/from stand Pt Will Transfer to Toilet: with min assist;stand pivot transfer;bedside commode Pt/caregiver will Perform Home Exercise Program: Increased ROM;Increased strength;Both right and left upper extremity;With minimal assist;With written HEP provided Additional ADL Goal #1: Pt will perform bed mobility min A in prep for seated ADLs  Plan      Co-evaluation    PT/OT/SLP Co-Evaluation/Treatment: Yes Reason for Co-Treatment: Complexity of the patient's impairments (multi-system involvement);Necessary to address cognition/behavior during functional activity;For patient/therapist safety;To address  functional/ADL transfers PT goals addressed during session: Mobility/safety with mobility;Balance OT goals addressed during session: ADL's and self-care      AM-PAC OT 6 Clicks Daily Activity     Outcome Measure   Help from another person eating meals?: A Lot Help from another person taking care of personal grooming?: A Lot Help from another person toileting, which includes using toliet, bedpan, or urinal?: Total Help from another person bathing (including washing, rinsing, drying)?: A Lot Help from another person to put on and taking off regular upper body clothing?: A Lot Help from another person to put on and taking off regular lower body clothing?: Total 6 Click Score: 10    End of Session Equipment Utilized During Treatment: Gait belt;Oxygen  (1L)  OT Visit Diagnosis: Unsteadiness on feet (R26.81);Other abnormalities of gait and mobility (R26.89);Muscle weakness (generalized) (M62.81)   Activity Tolerance Patient limited by fatigue   Patient Left in chair;with call bell/phone within reach;with chair alarm set   Nurse Communication Mobility status        Time: 8455-8384 OT Time Calculation (min): 31 min  Charges: OT General Charges $OT Visit: 1 Visit OT Treatments $Self Care/Home Management : 8-22 mins  Brighten Orndoff K, OTD, OTR/L SecureChat Preferred Acute Rehab [phone]   Laneta MARLA Pereyra  11/06/2024, 4:30 PM

## 2024-11-06 NOTE — Progress Notes (Signed)
 PHARMACY - ANTICOAGULATION CONSULT NOTE  Pharmacy Consult for heparin  infusion Indication: postop vascular procedure  Patient Measurements: Height: 5' 4 (162.6 cm) Weight: 73 kg (160 lb 15 oz) IBW/kg (Calculated) : 54.7 HEPARIN  DW (KG): 59.9  Vital Signs: Temp: 98.1 F (36.7 C) (11/25 2321) Temp Source: Axillary (11/25 2321) BP: 89/61 (11/25 2300) Pulse Rate: 64 (11/25 2300)  Labs: Recent Labs    11/03/24 0353 11/03/24 1935 11/04/24 0500 11/04/24 1729 11/05/24 0055 11/05/24 0337 11/05/24 0814 11/05/24 1055 11/05/24 1526 11/05/24 2358  HGB 9.2*  --  7.3*  --   --  6.9*  --  7.9*  --   --   HCT 27.5*  --  22.3*  --   --  21.6*  --  24.7*  --   --   PLT 64*  --  87*  --   --  135*  --   --   --   --   HEPARINUNFRC <0.10*  --  <0.10* <0.10*   < >  --  <0.10*  --  0.13* 0.10*  CREATININE 1.20*   < > 0.74 0.60  --   --   --  0.56  --   --    < > = values in this interval not displayed.    Estimated Creatinine Clearance: 75.9 mL/min (by C-G formula based on SCr of 0.56 mg/dL).   Assessment: 58 yo F presenting with E. Coli bacteremia 2/2 SBO with perforation. On 11/22 underwent closure of abdomen in OR and repair of left radial artery. Not on AC PTA. Pharmacy consulted for heparin  management.  Received 3 units of platelets 11/22.  -Heparin  level 0.13 > 0.1 (subtherapeutic) after rate increase -Last Hgb 7.9, plts 87 > 135  Goal of Therapy:  Heparin  level 0.3-0.5 units/ml Monitor platelets by anticoagulation protocol: Yes   Plan:  Increase heparin  1300 units/h for now Repeat heparin  level in 6h CBC daily  Lynwood Poplar, PharmD, BCPS Clinical Pharmacist 11/06/2024 12:27 AM

## 2024-11-06 NOTE — Progress Notes (Signed)
 Nutrition Follow-up  DOCUMENTATION CODES:   Severe malnutrition in context of chronic illness  INTERVENTION:   Discussed plan with Dr. Signe; plan to discontinue TPN today. Discussed with Pharmacy as well  Tube Feeding via G-tube: Vital AF 1.2 at 40 ml/hr, goal rate of 60 ml/hr. Add Pro-Source TF20 60 mL daily TF at goal rate provides 1808 kcals, 128 g of protein and 1166 mL of free water   Once tolerating TF at goal rate, recommend trial of standard TF formula  Once TF at goal, recommend considering Juven BID for wound healing  +Constipation; recommend considering bowel regimen, consider enema or suppository  Continue IV Thiamine  100 mg Continue IV copper  2mg  daily x 6 days and Zinc  Sulfate 220 mg daily per tube for at least 14 days.  Plan to recheck values post supplementation with CRP  Serum Vitamin A  level returned low; assess for repletion once tolerating oral diet   NUTRITION DIAGNOSIS:   Severe Malnutrition related to chronic illness as evidenced by moderate muscle depletion, severe muscle depletion, energy intake < or equal to 75% for > or equal to 1 month, edema, percent weight loss.  Continue but being addressed via nutrition support  GOAL:   Patient will meet greater than or equal to 90% of their needs  Progressing  MONITOR:   Vent status, Labs, Weight trends  REASON FOR ASSESSMENT:   Consult Enteral/tube feeding initiation and management, New TPN/TNA  ASSESSMENT:   58 y.o. female with PMH of Roux-en-Y gastric bypass (2018) who presented with abdominal pain, lactic acidosis and lipase greater than 2800. Admitted for SBO.  11/18 Admitted to [facility], OR: Ex Lap with LOA, G-tube placement in gastric remnant, wound VAC, vent postop 11/20 OR: Ex Lap with washout, wound VAC 11/22 OR: Washout with abdominal closure, wound VAC to open wound; Ischemic hand with occluded L radial artery s/p revascularization by VVS 11/23 TPN initiated, VHP at 10 ml/hr  initiated 11/24 Extubated. TPN continues at 30 ml/hr, VAF 1.2 at 20 11/25 TPN continue at 30 ml/hr, VAF 1.2 up to 30 ml/hr   Pt appears weak. Pt reports she feels ok. Denies nausea, reports some abd pain  Vital AF 1.2 increased to 40 ml/hr per Surgery, Pro-source TF20 added. TF goal rate of 60 ml/hr  CL diet ordered this AM, not tried anything yet on visit this AM.   TPN at 30 ml/hr  Still no BM, no documented BM since admission  Wound VAC in place  Micronutrient Labs (10/30/24): CRP: not checked Copper : 67 (L)-supplementing Folate: 15.8(wdl) Vitamin A : 9.9 (L) Vitamin B12: 2693 (H)-elevated in presence of inflammation Zinc : 15(L)-supplementing  Labs: Sodium 137 (wdl) Potassium 3.6 (wdl) BUN 17 Creatinine 0.46  Phosphorus 3.5 (wdl) Magnesium  2.0 (wdl)  Meds: Lasix  SS novolog  IV Thiamine  IV copper  chloride 2mg  x 6 days Zinc  Sulfate 220 mg x 14 days  Diet Order:   Diet Order             Diet clear liquid Room service appropriate? Yes; Fluid consistency: Thin  Diet effective now                   EDUCATION NEEDS:   Not appropriate for education at this time  Skin:  Skin Assessment: Skin Integrity Issues: Skin Integrity Issues:: Other (Comment) DTI: R Heel, Coccyx Incisions: Large abdominal wound at surgical incision Other: necrotic L thumb (seen by Ortho- amputation at some point)  Last BM:  no BM yet  Height:   Ht Readings  from Last 1 Encounters:  10/29/24 5' 4 (1.626 m)    Weight:   Wt Readings from Last 1 Encounters:  11/05/24 73 kg    Ideal Body Weight:  54.55 kg  BMI:  Body mass index is 27.62 kg/m.  Estimated Nutritional Needs:   Kcal:  1700-1900 kcals  Protein:  110-130 g  Fluid:  >/= 1.8L    Betsey Finger MS, RDN, LDN, CNSC Registered Dietitian 3 Clinical Nutrition RD Inpatient Contact Info in Amion

## 2024-11-06 NOTE — Progress Notes (Signed)
 Speech Language Pathology Treatment: Dysphagia  Patient Details Name: Morgan [name] MRN: [id number] DOB: 1966/09/12 Today's Date: 11/06/2024 Time: 9058-9047 SLP Time Calculation (min) (ACUTE ONLY): 11 min  Assessment / Plan / Recommendation Clinical Impression  Pt's vocal quality appears improved compared to previous date. Given assistance, pt held the cup to take sips of water  via cup and straw without coughing or clearing her throat. Multiple swallows persist but she endorses odynophagia which may be secondary to seven day intubation. Recommend starting clear liquid diet with full supervision. Suspect her intake will be limited by disinterest. SLP will continue following to assess further once medically cleared to advance diet.    HPI HPI: 58 yo female presenting 11/18 with abdominal pain. Found to have SBO with possible perforation. S/p ex lap with LOA, G-tube placement 11/18, ex lap with washout 11/20, and washout with abdominal closure and wound vac placement 11/22. Noted ischemic changes of the L hand, orthopedics following. ETT 11/18-11/24. PMH: SBO (2020), roux-en-Y gastric bypass, DM, GERD, Barrett's esophagus      SLP Plan  Continue with current plan of care          Recommendations  Diet recommendations: Other(comment) (clear liquids) Liquids provided via: Teaspoon;Cup;Straw Medication Administration: Via alternative means Supervision: Staff to assist with self feeding;Full supervision/cueing for compensatory strategies Compensations: Minimize environmental distractions;Slow rate;Small sips/bites Postural Changes and/or Swallow Maneuvers: Seated upright 90 degrees                  Oral care BID   Frequent or constant Supervision/Assistance Dysphagia, unspecified (R13.10)     Continue with current plan of care     Damien Blumenthal, M.A., CCC-SLP Speech Language Pathology, Acute Rehabilitation Services  Secure Chat preferred [phone]   11/06/2024,  11:09 AM

## 2024-11-06 NOTE — Progress Notes (Signed)
 PHARMACY - ANTICOAGULATION CONSULT NOTE  Pharmacy Consult for heparin  infusion Indication: acute radial artery occlusion/ischemic L hand  Patient Measurements: Height: 5' 4 (162.6 cm) Weight: 73 kg (160 lb 15 oz) IBW/kg (Calculated) : 54.7 HEPARIN  DW (KG): 59.9  Vital Signs: Temp: 97.9 F (36.6 C) (11/26 0801) Temp Source: Oral (11/26 0801) BP: 98/62 (11/26 0800) Pulse Rate: 70 (11/26 0800)  Labs: Recent Labs    11/04/24 0500 11/04/24 1729 11/05/24 0055 11/05/24 0337 11/05/24 0814 11/05/24 1055 11/05/24 1526 11/05/24 2358 11/06/24 0436 11/06/24 0824  HGB 7.3*  --   --  6.9*  --  7.9*  --   --  8.6*  --   HCT 22.3*  --   --  21.6*  --  24.7*  --   --  26.8*  --   PLT 87*  --   --  135*  --   --   --   --  199  --   HEPARINUNFRC <0.10* <0.10*   < >  --    < >  --  0.13* 0.10*  --  0.24*  CREATININE 0.74 0.60  --   --   --  0.56  --   --  0.46  --    < > = values in this interval not displayed.    Estimated Creatinine Clearance: 75.9 mL/min (by C-G formula based on SCr of 0.46 mg/dL).   Assessment: 58 yo F presenting with E. Coli bacteremia 2/2 SBO with perforation. On 11/22 underwent closure of abdomen in OR and repair of left radial artery. Not on AC PTA. Pharmacy consulted for heparin  management.  Received 3 units of platelets 11/22.  Heparin  level is slightly subtherapeutic but trending upwards at 0.24, on 1300 units/hr. Hgb 8.6, plt 199. No s/sx of bleeding or infusion issues.   Goal of Therapy:  Heparin  level 0.3-0.5 units/ml Monitor platelets by anticoagulation protocol: Yes   Plan:  Increase heparin  infusion to 1400 units/hr Repeat heparin  level in 6h Monitor daily HL, CBC, and for s/sx of bleeding   Thank you for allowing pharmacy to participate in this patient's care,  Suzen Sour, PharmD, BCCCP Clinical Pharmacist  Phone: [phone] 11/06/2024 9:15 AM  Please check AMION for all [facility] Pharmacy phone numbers After 10:00 PM, call Main Pharmacy  [phone]

## 2024-11-07 DIAGNOSIS — I5020 Unspecified systolic (congestive) heart failure: Secondary | ICD-10-CM | POA: Diagnosis not present

## 2024-11-07 DIAGNOSIS — E440 Moderate protein-calorie malnutrition: Secondary | ICD-10-CM | POA: Diagnosis not present

## 2024-11-07 DIAGNOSIS — E878 Other disorders of electrolyte and fluid balance, not elsewhere classified: Secondary | ICD-10-CM | POA: Diagnosis not present

## 2024-11-07 DIAGNOSIS — K56609 Unspecified intestinal obstruction, unspecified as to partial versus complete obstruction: Secondary | ICD-10-CM | POA: Diagnosis not present

## 2024-11-07 LAB — CBC
HCT: 23.9 % — ABNORMAL LOW (ref 36.0–46.0)
Hemoglobin: 7.9 g/dL — ABNORMAL LOW (ref 12.0–15.0)
MCH: 29 pg (ref 26.0–34.0)
MCHC: 33.1 g/dL (ref 30.0–36.0)
MCV: 87.9 fL (ref 80.0–100.0)
Platelets: 246 K/uL (ref 150–400)
RBC: 2.72 MIL/uL — ABNORMAL LOW (ref 3.87–5.11)
RDW: 15 % (ref 11.5–15.5)
WBC: 11.3 K/uL — ABNORMAL HIGH (ref 4.0–10.5)
nRBC: 0 % (ref 0.0–0.2)

## 2024-11-07 LAB — GLUCOSE, CAPILLARY
Glucose-Capillary: 117 mg/dL — ABNORMAL HIGH (ref 70–99)
Glucose-Capillary: 166 mg/dL — ABNORMAL HIGH (ref 70–99)
Glucose-Capillary: 115 mg/dL — ABNORMAL HIGH (ref 70–99)
Glucose-Capillary: 109 mg/dL — ABNORMAL HIGH (ref 70–99)
Glucose-Capillary: 128 mg/dL — ABNORMAL HIGH (ref 70–99)

## 2024-11-07 LAB — BASIC METABOLIC PANEL WITH GFR
Anion gap: 7 (ref 5–15)
BUN: 18 mg/dL (ref 6–20)
CO2: 32 mmol/L (ref 22–32)
Calcium: 7.4 mg/dL — ABNORMAL LOW (ref 8.9–10.3)
Chloride: 97 mmol/L — ABNORMAL LOW (ref 98–111)
Creatinine, Ser: 0.47 mg/dL (ref 0.44–1.00)
GFR, Estimated: >60 mL/min (ref >60)
Glucose, Bld: 120 mg/dL — ABNORMAL HIGH (ref 70–99)
Potassium: 4.2 mmol/L (ref 3.5–5.1)
Sodium: 136 mmol/L (ref 135–145)

## 2024-11-07 LAB — HEPARIN LEVEL (UNFRACTIONATED)
Heparin Unfractionated: 0.22 [IU]/mL — ABNORMAL LOW (ref 0.30–0.70)
Heparin Unfractionated: 0.27 [IU]/mL — ABNORMAL LOW (ref 0.30–0.70)
Heparin Unfractionated: 0.31 [IU]/mL (ref 0.30–0.70)

## 2024-11-07 LAB — COOXEMETRY PANEL
Carboxyhemoglobin: 2 % — ABNORMAL HIGH (ref 0.5–1.5)
Methemoglobin: <0.7 % (ref 0.0–1.5)
O2 Saturation: 69.4 %
Total hemoglobin: 8.1 g/dL — ABNORMAL LOW (ref 12.0–16.0)

## 2024-11-07 MED ORDER — MAGNESIUM SULFATE 2 GM/50ML IV SOLN
2.0000 g | Freq: Once | INTRAVENOUS | Status: AC
  Administered 2024-11-07: 2 g via INTRAVENOUS
  Filled 2024-11-07: qty 50

## 2024-11-07 MED ORDER — VITAL AF 1.2 CAL PO LIQD
1000.0000 mL | ORAL | Status: DC
  Administered 2024-11-07: 1000 mL
  Filled 2024-11-07 (×2): qty 1000

## 2024-11-07 NOTE — Progress Notes (Signed)
 Progress Note  5 Days Post-Op  Subjective: No complaints this morning.  Does endorse some abdominal cramping when asked, but no nausea and tolerating tube feeds at 40.  Having some bowel movements now  Objective: Vital signs in last 24 hours: Temp:  [97.4 F (36.3 C)-98.2 F (36.8 C)] 98.1 F (36.7 C) (11/27 0736) Pulse Rate:  [75-83] 83 (11/27 0736) Resp:  [13-20] 20 (11/27 0736) BP: (79-102)/(50-80) 95/68 (11/27 0736) SpO2:  [87 %-97 %] 92 % (11/27 0736) Last BM Date : 11/06/24  Intake/Output from previous day: 11/26 0701 - 11/27 0700 In: 1516.8 [I.V.:250.3; NG/GT:975; IV Piggyback:231.5] Out: 800 [Urine:800] Intake/Output this shift: No intake/output data recorded.  PE: General: NAD HEENT: sclera anicteric Heart: regular, rate, and rhythm.   Lungs: Respiratory effort nonlabored Abd: soft, nondistended, appropriately ttp, G-tube present with sutures in place, midline wound with vac in place good seal, photo below from 11/25     Lab Results:  Recent Labs    11/06/24 0436 11/07/24 0440  WBC 11.3* 11.3*  HGB 8.6* 7.9*  HCT 26.8* 23.9*  PLT 199 246   BMET Recent Labs    11/06/24 0436 11/07/24 0440  NA 137 136  K 3.6 4.2  CL 97* 97*  CO2 33* 32  GLUCOSE 116* 120*  BUN 17 18  CREATININE 0.46 0.47  CALCIUM  7.3* 7.4*   PT/INR No results for input(s): LABPROT, INR in the last 72 hours. CMP     Component Value Date/Time   NA 136 11/07/2024 0440   NA 142 09/23/2024 1030   K 4.2 11/07/2024 0440   CL 97 (L) 11/07/2024 0440   CO2 32 11/07/2024 0440   GLUCOSE 120 (H) 11/07/2024 0440   BUN 18 11/07/2024 0440   BUN 17 09/23/2024 1030   CREATININE 0.47 11/07/2024 0440   CREATININE 0.82 06/18/2013 1009   CALCIUM  7.4 (L) 11/07/2024 0440   PROT 4.1 (L) 11/04/2024 0500   PROT 6.5 06/28/2024 0842   ALBUMIN  <1.5 (L) 11/04/2024 0500   ALBUMIN  4.5 06/28/2024 0842   AST 88 (H) 11/04/2024 0500   ALT 64 (H) 11/04/2024 0500   ALKPHOS 48 11/04/2024 0500    BILITOT 0.8 11/04/2024 0500   BILITOT 0.3 06/28/2024 0842   GFRNONAA >60 11/07/2024 0440   GFRNONAA 87 06/18/2013 1009   GFRAA >60 08/04/2020 1038   GFRAA >89 06/18/2013 1009   Lipase     Component Value Date/Time   LIPASE 22 11/01/2024 0450       Studies/Results: No results found.  Anti-infectives: Anti-infectives (From admission, onward)    Start     Dose/Rate Route Frequency Ordered Stop   10/31/24 1400  piperacillin -tazobactam (ZOSYN ) IVPB 3.375 g        3.375 g 12.5 mL/hr over 240 Minutes Intravenous Every 8 hours 10/31/24 0751 11/07/24 1359   10/31/24 1100  micafungin  (MYCAMINE ) 100 mg in sodium chloride  0.9 % 100 mL IVPB        100 mg 105 mL/hr over 1 Hours Intravenous Every 24 hours 10/31/24 0901 11/04/24 1354   10/30/24 0600  piperacillin -tazobactam (ZOSYN ) IVPB 3.375 g  Status:  Discontinued        3.375 g 12.5 mL/hr over 240 Minutes Intravenous Every 8 hours 10/29/24 2350 10/31/24 0751   10/29/24 2145  piperacillin -tazobactam (ZOSYN ) IVPB 3.375 g        3.375 g 100 mL/hr over 30 Minutes Intravenous  Once 10/29/24 2137 10/29/24 2205   10/29/24 1700  cefTRIAXone  (ROCEPHIN ) 1 g  in sodium chloride  0.9 % 100 mL IVPB  Status:  Discontinued        1 g 200 mL/hr over 30 Minutes Intravenous  Once 10/29/24 1646 10/29/24 1656   10/29/24 1700  metroNIDAZOLE  (FLAGYL ) IVPB 500 mg        500 mg 100 mL/hr over 60 Minutes Intravenous  Once 10/29/24 1646 10/29/24 1809   10/29/24 1700  cefTRIAXone  (ROCEPHIN ) 2 g in sodium chloride  0.9 % 100 mL IVPB        2 g 200 mL/hr over 30 Minutes Intravenous  Once 10/29/24 1656 10/29/24 1733        Assessment/Plan POD 9, s/p ex lap with LOA, g-tube placement in gastric remnant Dr. Vernetta 11/18 secondary to SBO with possible perforation POD 7, s/p ex lap with washout, Dr. Tanda 11/20 POD 5, s/p washout, abdominal closure Dr. Tanda 11/22 - cont zosyn , BC 2/4 positive for E coli.  Completed antifungal course and completed stress  dose steroid course - continue VAC T/F - hgb 7.9 this am, rec'd 1 U PRBC 11/25   FEN - IVFs per CCM, increase TF to 26mL/hr . Prosource/supplements per RD recs.  Clear liquids only PO per SLP VTE - heparin  gtt ID - micafungin  5 doses completed; Zosyn  11/20>>   - per CCM -  E coli bacteremia - continue zosyn  Septic shock - off pressors, improving  CAD/Heart failure - EF 20-25% on echo 11/21 AKI - resolved DM - SSI Leukopenia - resolved, secondary to sepsis Thrombocytopenia - resolved Hypocalcemia - per CCM Neck wound - unclear etiology, have asked nursing to place mepilex over this to protect it.  Avoid dressings or lines in this area. L hand ischemic changes -  VVS following, s/p embolectomy radial artery and repair, likely will need more surgery. Hand surgery consulted   DISPO- CIR evaluating   LOS: 9 days   Mitzie DELENA Freund, MD  [facility] Surgery 11/07/2024, 9:15 AM Please see Amion for pager number during day hours 7:00am-4:30pm

## 2024-11-07 NOTE — Progress Notes (Addendum)
 PHARMACY - ANTICOAGULATION CONSULT NOTE  Pharmacy Consult for heparin  infusion Indication: acute radial artery occlusion/ischemic L hand  Patient Measurements: Height: 5' 4 (162.6 cm) Weight: 73 kg (160 lb 15 oz) IBW/kg (Calculated) : 54.7 HEPARIN  DW (KG): 59.9  Vital Signs: Temp: 98.1 F (36.7 C) (11/27 0736) Temp Source: Oral (11/27 0736) BP: 95/68 (11/27 0736) Pulse Rate: 83 (11/27 0736)  Labs: Recent Labs    11/05/24 0337 11/05/24 0814 11/05/24 1055 11/05/24 1526 11/06/24 0436 11/06/24 0824 11/06/24 1934 11/07/24 0440 11/07/24 1350  HGB 6.9*  --  7.9*  --  8.6*  --   --  7.9*  --   HCT 21.6*  --  24.7*  --  26.8*  --   --  23.9*  --   PLT 135*  --   --   --  199  --   --  246  --   HEPARINUNFRC  --    < >  --    < >  --    < > 0.18* 0.22* 0.27*  CREATININE  --   --  0.56  --  0.46  --   --  0.47  --    < > = values in this interval not displayed.    Estimated Creatinine Clearance: 75.9 mL/min (by C-G formula based on SCr of 0.47 mg/dL).   Assessment: 58 yo F presenting with E. Coli bacteremia 2/2 SBO with perforation. On 11/22 underwent closure of abdomen in OR and repair of left radial artery. Not on AC PTA. Pharmacy consulted for heparin  management.  Received 3 units of platelets 11/22.  Heparin  level is subtherapeutic at 0.27, on 1600 units/hr. Hgb 7.9, plt 246. No s/sx of bleeding or infusion issues. Heparin  running through PICC where level being drawn - RN appropriately paused/flushed > has PIV so will change to heparin  in PIV and draw from PICC if possible.  Goal of Therapy:  Heparin  level 0.3-0.5 units/ml Monitor platelets by anticoagulation protocol: Yes   Plan:  Increase heparin  infusion to 1700 units/hr Order heparin  level in 6 hrs  Monitor daily HL, CBC, and for s/sx of bleeding   Thank you for allowing pharmacy to participate in this patient's care,  Suzen Sour, PharmD, BCCCP Clinical Pharmacist  Phone: [phone] 11/07/2024 2:17  PM  Please check AMION for all [facility] Pharmacy phone numbers After 10:00 PM, call Main Pharmacy [phone]

## 2024-11-07 NOTE — Progress Notes (Addendum)
 PROGRESS NOTE    KITZIA CAMUS  FMW:[id number] DOB: 1966-01-13 DOA: 10/29/2024 PCP: [name], DO   Brief Narrative:  Morgan [name] is a 58 year old female with history of Roux-en-Y gastric bypass who presented to the emergency department with abdominal pain, lactic acidosis and lipase greater than 2800.  She had CCS evaluation and went for exploratory laparotomy.  Found to have dense adhesive band that was obstructing her proximal small bowel as well as biliary leak without evidence of perforation.  She had a gastrostomy tube placed with a wound VAC.  Initially on Levophed , vasopressin , intubated on bicarb drip postoperatively.  Patient's septic shock continue to improve, off pressors, NG tube remains in place, postoperative care per general surgery including diet advancement.  Patient to complete antibiotics on 11/27, completed antifungals 11/25. Left thumb ischemia status post thrombectomy appears to be stable, vascular surgery following. Patient's chronic comorbid conditions otherwise appear to be improving if not stable.  Assessment & Plan:   Principal Problem:   SBO (small bowel obstruction) s/p lap LOA 03/19/2019 Active Problems:   Malnutrition of moderate degree   Protein-calorie malnutrition, severe  Septic shock due to perforated SBO with peritonitis, E Coli bacteremia SBO due to adhesion Status post ex lap with lysis of adhesions 11/18; washouts on 11/20 and abdominal closure on 11/22.  G-tube in gastric remnant History of Roux-en-Y bariatric surgery - TPN off as of 11/27, tube feeds ongoing with clear diet, defer to general surgery for ongoing diet management - zosyn  completed for ecoli bactermia; micafungin  completed 11/25 for peritonitis    Acute HFrEF- concerning given CAD hx vs septic cardiomyopathy  - Off dobutamine , avoid pressors given above - GDMT on hold due to hypotension per heart failure team -start low dose Lasix  - Cardiac cath pending further  recovery   Critical left hand ischemia due to thrombosed radial artery, s/p thrombectomy - Left radial arterial line discontinued 11/20, status post radial artery embolectomy with vascular surgery who notes left thumb necrosis distal to MP joint, per vascular surgery will allow this to declare then plan for amputation. - Continue heparin  drip   Urinary retention, improving Acute kidney injury, resolved - Foley previously removed with noted difficulty voiding over the past 24h - plan to replace foley catheter later today if still unable to void/requiring repeated in/out cath - Strict I/O -renally dose meds, avoid nephrotoxic meds  Thrombocytopenia due to sepsis, resolved - no need for platelet transfusion; monitor   Acute anemia, recovering - Presumed blood loss vs worsening chronic anemia of chronic disease/aplastic anemia - Hemoglobin improved to 8.6 after transfusion, 1 unit 11/25   Acute hypoxic respiratory failure postoperatively RLL PNA- suspect aspiration from SBO - extubated successfully 11/24 - wean O2 for sat >92%  - IS and mobilize as able   Hypoglycemia  H/o DM2 but  A1c 5.3 and not on meds PTA-- resolved after Roux-en-Y? -monitor; not needing insulin  - watch with stopping steroids   Hypothyroidism - con't synthroid    Elevated transaminases, hyperbilirubinemia - stabilizing  - bilirubin back to normal    Depression/Anxiety  - Increase sertraline  back to home dose  Pressure injury noted, POA Wound 10/31/24 1716 Pressure Injury Coccyx Medial Deep Tissue Pressure Injury - Purple or maroon localized area of discolored intact skin or blood-filled blister due to damage of underlying soft tissue from pressure and/or shear. (Active)   DVT prophylaxis: SCD's Start: 10/29/24 2346 continues on heparin  drip Code Status:   Code Status: Full Code Family Communication:  None present  Status is: Inpatient  Dispo: The patient is from: Home              Anticipated d/c is to:  To be determined              Anticipated d/c date is: To be determined              Patient currently not medically stable for discharge  Consultants:  PCCM, general surgery, vascular surgery  Antimicrobials:  Micafungin  completed Zosyn  completed  Subjective:   Objective: Vitals:   11/07/24 0125 11/07/24 0126 11/07/24 0414 11/07/24 0736  BP: 102/63  (!) 86/60 95/68  Pulse:  79 76 83  Resp:  20 19 20   Temp:   97.6 F (36.4 C) 98.1 F (36.7 C)  TempSrc:   Oral Oral  SpO2: 93% 92% 94% 92%  Weight:      Height:        Intake/Output Summary (Last 24 hours) at 11/07/2024 0755 Last data filed at 11/07/2024 0100 Gross per 24 hour  Intake 1516.81 ml  Output 800 ml  Net 716.81 ml   Filed Weights   11/03/24 0500 11/04/24 0500 11/05/24 0700  Weight: 80.1 kg 71.6 kg 73 kg    Examination:  General:  Pleasantly resting in bed, No acute distress. HEENT:  Normocephalic atraumatic.  Sclerae nonicteric, noninjected.  Extraocular movements intact bilaterally. Neck:  Without mass or deformity.  Trachea is midline. Lungs: Diminished without wheeze rales or rhonchi. Heart:  Regular rate and rhythm.  Without murmurs, rubs, or gallops. Abdomen: Nontender, G-tube noted, postoperative bandage clean dry intact Extremities: Without cyanosis, clubbing, edema, or obvious deformity.  Right upper extremity PICC line noted Skin:  Warm and dry, no erythema. Wound 10/31/24 1716 Pressure Injury Coccyx Medial Deep Tissue Pressure Injury - Purple or maroon localized area of discolored intact skin or blood-filled blister due to damage of underlying soft tissue from pressure and/or shear. (Active)    Data Reviewed: I have personally reviewed following labs and imaging studies  CBC: Recent Labs  Lab 11/03/24 0353 11/04/24 0500 11/05/24 0337 11/05/24 1055 11/06/24 0436 11/07/24 0440  WBC 11.1* 10.4 9.1  --  11.3* 11.3*  HGB 9.2* 7.3* 6.9* 7.9* 8.6* 7.9*  HCT 27.5* 22.3* 21.6* 24.7* 26.8*  23.9*  MCV 85.1 87.8 88.9  --  89.0 87.9  PLT 64* 87* 135*  --  199 246   Basic Metabolic Panel: Recent Labs  Lab 11/04/24 0500 11/04/24 1729 11/05/24 0337 11/05/24 1055 11/05/24 1807 11/06/24 0436 11/06/24 1700 11/07/24 0440  NA 136 135  --  133*  --  137  --  136  K 2.9* 4.3  --  4.0  --  3.6  --  4.2  CL 94* 90*  --  92*  --  97*  --  97*  CO2 34* 35*  --  34*  --  33*  --  32  GLUCOSE 188* 524*  --  414*  --  116*  --  120*  BUN 31* 27*  --  23*  --  17  --  18  CREATININE 0.74 0.60  --  0.56  --  0.46  --  0.47  CALCIUM  7.2* 7.1*  --  7.0*  --  7.3*  --  7.4*  MG 1.7 2.3 2.0  --  1.8 2.0 1.6*  --   PHOS 2.6 2.8 2.2*  --  3.0 3.5 2.9  --    GFR: Estimated  Creatinine Clearance: 75.9 mL/min (by C-G formula based on SCr of 0.47 mg/dL). Liver Function Tests: Recent Labs  Lab 11/01/24 0450 11/01/24 0920 11/02/24 0355 11/03/24 0353 11/04/24 0500  AST 124*  --  78*  78* 80* 88*  ALT 103*  --  72*  72* 66* 64*  ALKPHOS 62  --  44  44 54 48  BILITOT 0.7 0.7 0.8  0.8 1.3* 0.8  PROT 4.3*  --  3.8*  3.8* 4.2* 4.1*  ALBUMIN  2.1*  --  <1.5*  <1.5* 1.6* <1.5*   Recent Labs  Lab 11/01/24 0450  LIPASE 22   No results for input(s): AMMONIA in the last 168 hours. Coagulation Profile: Recent Labs  Lab 10/31/24 0945 11/01/24 0450 11/02/24 0147 11/02/24 0355 11/02/24 0853  INR 1.8* 1.0 1.0 0.9 0.9   Cardiac Enzymes: No results for input(s): CKTOTAL, CKMB, CKMBINDEX, TROPONINI in the last 168 hours. BNP (last 3 results) Recent Labs    11/01/24 0920  PROBNP >35,000.0*   HbA1C: No results for input(s): HGBA1C in the last 72 hours. CBG: Recent Labs  Lab 11/06/24 1109 11/06/24 1617 11/06/24 2007 11/06/24 2308 11/07/24 0425  GLUCAP 120* 162* 115* 134* 117*   Lipid Profile: No results for input(s): CHOL, HDL, LDLCALC, TRIG, CHOLHDL, LDLDIRECT in the last 72 hours.  Thyroid  Function Tests: No results for input(s): TSH,  T4TOTAL, FREET4, T3FREE, THYROIDAB in the last 72 hours. Anemia Panel: No results for input(s): VITAMINB12, FOLATE, FERRITIN, TIBC, IRON , RETICCTPCT in the last 72 hours. Sepsis Labs: Recent Labs  Lab 10/31/24 1240 11/01/24 0450 11/01/24 1544 11/02/24 0355  LATICACIDVEN 3.3* 1.7 1.4 1.1    Recent Results (from the past 240 hours)  Culture, blood (Routine x 2)     Status: Abnormal   Collection Time: 10/29/24  5:36 PM   Specimen: BLOOD LEFT WRIST  Result Value Ref Range Status   Specimen Description   Final    BLOOD LEFT WRIST Performed at [facility], [address] [place], [place] [address]    Special Requests   Final    BOTTLES DRAWN AEROBIC ONLY Blood Culture results may not be optimal due to an inadequate volume of blood received in culture bottles Performed at [facility], [address] [place], [place] [address]    Culture  Setup Time   Final    GRAM NEGATIVE RODS AEROBIC BOTTLE ONLY CRITICAL VALUE NOTED.  VALUE IS CONSISTENT WITH PREVIOUSLY REPORTED AND CALLED VALUE.    Culture (A)  Final    ESCHERICHIA COLI SUSCEPTIBILITIES PERFORMED ON PREVIOUS CULTURE WITHIN THE LAST 5 DAYS. Performed at [facility], [address], [place], [place] [address]    Report Status 11/01/2024 FINAL  Final  Culture, blood (Routine x 2)     Status: Abnormal   Collection Time: 10/29/24  5:37 PM   Specimen: BLOOD RIGHT HAND  Result Value Ref Range Status   Specimen Description   Final    BLOOD RIGHT HAND Performed at [facility], [address] [place], [place] [address]    Special Requests   Final    BOTTLES DRAWN AEROBIC ONLY Blood Culture results may not be optimal due to an inadequate volume of blood received in culture bottles Performed at [facility], [address], [place], [place] [address]    Culture  Setup Time   Final    GRAM NEGATIVE RODS AEROBIC BOTTLE ONLY CRITICAL RESULT CALLED TO,  READ  BACK BY AND VERIFIED WITH: PHARMD J.LEGGE AT 0810 ON 10/30/2024 BY T.SAAD. Performed at [facility], [address], [place], [place] [address]    Culture ESCHERICHIA COLI (A)  Final   Report Status 11/01/2024 FINAL  Final   Organism ID, Bacteria ESCHERICHIA COLI  Final      Susceptibility   Escherichia coli - MIC*    AMPICILLIN 4 SENSITIVE Sensitive     CEFAZOLIN  (NON-URINE) 2 SENSITIVE Sensitive     CEFEPIME <=0.12 SENSITIVE Sensitive     ERTAPENEM <=0.12 SENSITIVE Sensitive     CEFTRIAXONE  <=0.25 SENSITIVE Sensitive     CIPROFLOXACIN <=0.06 SENSITIVE Sensitive     GENTAMICIN <=1 SENSITIVE Sensitive     MEROPENEM <=0.25 SENSITIVE Sensitive     TRIMETH /SULFA  <=20 SENSITIVE Sensitive     AMPICILLIN/SULBACTAM <=2 SENSITIVE Sensitive     PIP/TAZO Value in next row Sensitive      <=4 SENSITIVEThis is a modified FDA-approved test that has been validated and its performance characteristics determined by the reporting laboratory.  This laboratory is certified under the Clinical Laboratory Improvement Amendments CLIA as qualified to perform high complexity clinical laboratory testing.    * ESCHERICHIA COLI  Blood Culture ID Panel (Reflexed)     Status: Abnormal   Collection Time: 10/29/24  5:37 PM  Result Value Ref Range Status   Enterococcus faecalis NOT DETECTED NOT DETECTED Final   Enterococcus Faecium NOT DETECTED NOT DETECTED Final   Listeria monocytogenes NOT DETECTED NOT DETECTED Final   Staphylococcus species NOT DETECTED NOT DETECTED Final   Staphylococcus aureus (BCID) NOT DETECTED NOT DETECTED Final   Staphylococcus epidermidis NOT DETECTED NOT DETECTED Final   Staphylococcus lugdunensis NOT DETECTED NOT DETECTED Final   Streptococcus species NOT DETECTED NOT DETECTED Final   Streptococcus agalactiae NOT DETECTED NOT DETECTED Final   Streptococcus pneumoniae NOT DETECTED NOT DETECTED Final   Streptococcus pyogenes NOT DETECTED NOT DETECTED Final    A.calcoaceticus-baumannii NOT DETECTED NOT DETECTED Final   Bacteroides fragilis NOT DETECTED NOT DETECTED Final   Enterobacterales DETECTED (A) NOT DETECTED Final    Comment: Enterobacterales represent a large order of gram negative bacteria, not a single organism. CRITICAL RESULT CALLED TO, READ BACK BY AND VERIFIED WITH: PHARMD J.LEGGE AT 0810 ON 10/30/2024 BY T.SAAD.    Enterobacter cloacae complex NOT DETECTED NOT DETECTED Final   Escherichia coli DETECTED (A) NOT DETECTED Final    Comment: CRITICAL RESULT CALLED TO, READ BACK BY AND VERIFIED WITH: PHARMD J.LEGGE AT 0810 ON 10/30/2024 BY T.SAAD.    Klebsiella aerogenes NOT DETECTED NOT DETECTED Final   Klebsiella oxytoca NOT DETECTED NOT DETECTED Final   Klebsiella pneumoniae NOT DETECTED NOT DETECTED Final   Proteus species NOT DETECTED NOT DETECTED Final   Salmonella species NOT DETECTED NOT DETECTED Final   Serratia marcescens NOT DETECTED NOT DETECTED Final   Haemophilus influenzae NOT DETECTED NOT DETECTED Final   Neisseria meningitidis NOT DETECTED NOT DETECTED Final   Pseudomonas aeruginosa NOT DETECTED NOT DETECTED Final   Stenotrophomonas maltophilia NOT DETECTED NOT DETECTED Final   Candida albicans NOT DETECTED NOT DETECTED Final   Candida auris NOT DETECTED NOT DETECTED Final   Candida glabrata NOT DETECTED NOT DETECTED Final   Candida krusei NOT DETECTED NOT DETECTED Final   Candida parapsilosis NOT DETECTED NOT DETECTED Final   Candida tropicalis NOT DETECTED NOT DETECTED Final   Cryptococcus neoformans/gattii NOT DETECTED NOT DETECTED Final   CTX-M ESBL NOT DETECTED NOT DETECTED Final  Carbapenem resistance IMP NOT DETECTED NOT DETECTED Final   Carbapenem resistance KPC NOT DETECTED NOT DETECTED Final   Carbapenem resistance NDM NOT DETECTED NOT DETECTED Final   Carbapenem resist OXA 48 LIKE NOT DETECTED NOT DETECTED Final   Carbapenem resistance VIM NOT DETECTED NOT DETECTED Final    Comment: Performed at  [facility], [address], [place], [place] [address]  MRSA Next Gen by PCR, Nasal     Status: None   Collection Time: 11/02/24  8:54 AM   Specimen: Nasal Mucosa; Nasal Swab  Result Value Ref Range Status   MRSA by PCR Next Gen NOT DETECTED NOT DETECTED Final    Comment: (NOTE) The GeneXpert MRSA Assay (FDA approved for NASAL specimens only), is one component of a comprehensive MRSA colonization surveillance program. It is not intended to diagnose MRSA infection nor to guide or monitor treatment for MRSA infections. Test performance is not FDA approved in patients less than 25 years old. Performed at [facility], [address] [name], [place] [address]          Radiology Studies: No results found.      Scheduled Meds:  acetaminophen   650 mg Per Tube Q6H   Chlorhexidine  Gluconate Cloth  6 each Topical Daily   feeding supplement (PROSource TF20)  60 mL Per Tube Daily   feeding supplement (VITAL AF 1.2 CAL)  1,000 mL Per Tube Q24H   furosemide   40 mg Per Tube Daily   insulin  aspart  0-15 Units Subcutaneous Q4H   levothyroxine   112 mcg Per Tube QAC breakfast   mouth rinse  15 mL Mouth Rinse 4 times per day   pantoprazole  (PROTONIX ) IV  40 mg Intravenous Q12H   sertraline   200 mg Per Tube Daily   spironolactone   12.5 mg Per Tube Daily   thiamine  (VITAMIN B1) injection  100 mg Intravenous Q24H   zinc  sulfate (50mg  elemental zinc )  220 mg Per Tube Daily   Continuous Infusions:  copper  chloride 2 mg in dextrose  5 % 100 mL IVPB Stopped (11/06/24 1257)   heparin  1,600 Units/hr (11/07/24 9392)   piperacillin -tazobactam (ZOSYN )  IV 3.375 g (11/07/24 0607)     LOS: 9 days   Time spent:  Elsie JAYSON Montclair, DO [facility]  If 7PM-7AM, please contact night-coverage [email or web address]  11/07/2024, 7:55 AM

## 2024-11-07 NOTE — Progress Notes (Addendum)
 PHARMACY - ANTICOAGULATION CONSULT NOTE  Pharmacy Consult for heparin  infusion Indication: acute radial artery occlusion/ischemic L hand  Patient Measurements: Height: 5' 4 (162.6 cm) Weight: 73 kg (160 lb 15 oz) IBW/kg (Calculated) : 54.7 HEPARIN  DW (KG): 59.9  Vital Signs: Temp: 99 F (37.2 C) (11/27 1912) Temp Source: Oral (11/27 1912) BP: 95/59 (11/27 1912) Pulse Rate: 73 (11/27 1912)  Labs: Recent Labs    11/05/24 0337 11/05/24 0814 11/05/24 1055 11/05/24 1526 11/06/24 0436 11/06/24 0824 11/07/24 0440 11/07/24 1350 11/07/24 2059  HGB 6.9*  --  7.9*  --  8.6*  --  7.9*  --   --   HCT 21.6*  --  24.7*  --  26.8*  --  23.9*  --   --   PLT 135*  --   --   --  199  --  246  --   --   HEPARINUNFRC  --    < >  --    < >  --    < > 0.22* 0.27* 0.31  CREATININE  --   --  0.56  --  0.46  --  0.47  --   --    < > = values in this interval not displayed.    Estimated Creatinine Clearance: 75.9 mL/min (by C-G formula based on SCr of 0.47 mg/dL).   Assessment: 58 yo F presenting with E. Coli bacteremia 2/2 SBO with perforation. On 11/22 underwent closure of abdomen in OR and repair of left radial artery. Not on AC PTA. Pharmacy consulted for heparin  management.  Received 3 units of platelets 11/22.  Heparin  level is subtherapeutic at 0.27, on 1600 units/hr. Hgb 7.9, plt 246. No s/sx of bleeding or infusion issues. Heparin  running through PICC where level being drawn - RN appropriately paused/flushed > has PIV so will change to heparin  in PIV and draw from PICC if possible.  11/27 PM - heparin  level 0.31 on lower end of therapeutic range.  Running through PIV, drawn from PICC.   Goal of Therapy:  Heparin  level 0.3-0.5 units/ml Monitor platelets by anticoagulation protocol: Yes   Plan:  Increase heparin  infusion slightly to 1750 units/hr Confirmatory 6h heparin  level (~AM labs) Monitor daily HL, CBC, and for s/sx of bleeding   Thank you for allowing pharmacy to  participate in this patient's care,  Maurilio Fila, PharmD Clinical Pharmacist 11/07/2024  9:32 PM

## 2024-11-07 NOTE — Progress Notes (Signed)
 PHARMACY - ANTICOAGULATION CONSULT NOTE  Pharmacy Consult for heparin  infusion Indication: acute radial artery occlusion/ischemic L hand  Patient Measurements: Height: 5' 4 (162.6 cm) Weight: 73 kg (160 lb 15 oz) IBW/kg (Calculated) : 54.7 HEPARIN  DW (KG): 59.9  Vital Signs: Temp: 97.8 F (36.6 C) (11/26 2245) Temp Source: Oral (11/26 2245) BP: 102/63 (11/27 0125) Pulse Rate: 79 (11/27 0126)  Labs: Recent Labs    11/04/24 1729 11/05/24 0055 11/05/24 0337 11/05/24 0814 11/05/24 1055 11/05/24 1526 11/06/24 0436 11/06/24 0824 11/06/24 1600 11/06/24 1934 11/07/24 0440  HGB  --    < > 6.9*  --  7.9*  --  8.6*  --   --   --  7.9*  HCT  --    < > 21.6*  --  24.7*  --  26.8*  --   --   --  23.9*  PLT  --   --  135*  --   --   --  199  --   --   --  246  HEPARINUNFRC <0.10*   < >  --    < >  --    < >  --    < > 1.10* 0.18* 0.22*  CREATININE 0.60  --   --   --  0.56  --  0.46  --   --   --   --    < > = values in this interval not displayed.    Estimated Creatinine Clearance: 75.9 mL/min (by C-G formula based on SCr of 0.46 mg/dL).   Assessment: 58 yo F presenting with E. Coli bacteremia 2/2 SBO with perforation. On 11/22 underwent closure of abdomen in OR and repair of left radial artery. Not on AC PTA. Pharmacy consulted for heparin  management.  Received 3 units of platelets 11/22.  11/26 PM Heparin  level is subtherapeutic at 0.18, on 1400 units/hr. Hgb 8.6, plt 199. No s/sx of bleeding or infusion issues. (Lab drawn at 1600 falsely elevated, likely drawn from heparin  infusion line)  11/27 AM update:  Heparin  level sub-therapeutic but trending up Plts improving   Goal of Therapy:  Heparin  level 0.3-0.5 units/ml Monitor platelets by anticoagulation protocol: Yes   Plan:  Increase heparin  infusion to 1600 units/hr Repeat heparin  level in 6-8 hours Monitor daily HL, CBC, and for s/sx of bleeding   Lynwood Mckusick, PharmD, BCPS Clinical Pharmacist Phone:  [phone]

## 2024-11-08 DIAGNOSIS — I5020 Unspecified systolic (congestive) heart failure: Secondary | ICD-10-CM | POA: Diagnosis not present

## 2024-11-08 DIAGNOSIS — K56609 Unspecified intestinal obstruction, unspecified as to partial versus complete obstruction: Secondary | ICD-10-CM | POA: Diagnosis not present

## 2024-11-08 DIAGNOSIS — E440 Moderate protein-calorie malnutrition: Secondary | ICD-10-CM | POA: Diagnosis not present

## 2024-11-08 DIAGNOSIS — E878 Other disorders of electrolyte and fluid balance, not elsewhere classified: Secondary | ICD-10-CM | POA: Diagnosis not present

## 2024-11-08 LAB — GLUCOSE, CAPILLARY
Glucose-Capillary: 113 mg/dL — ABNORMAL HIGH (ref 70–99)
Glucose-Capillary: 126 mg/dL — ABNORMAL HIGH (ref 70–99)
Glucose-Capillary: 96 mg/dL (ref 70–99)
Glucose-Capillary: 115 mg/dL — ABNORMAL HIGH (ref 70–99)
Glucose-Capillary: 121 mg/dL — ABNORMAL HIGH (ref 70–99)
Glucose-Capillary: 104 mg/dL — ABNORMAL HIGH (ref 70–99)

## 2024-11-08 LAB — COOXEMETRY PANEL
Carboxyhemoglobin: 2.2 % — ABNORMAL HIGH (ref 0.5–1.5)
Methemoglobin: 2.7 % — ABNORMAL HIGH (ref 0.0–1.5)
O2 Saturation: 76.7 %
Total hemoglobin: 8.2 g/dL — ABNORMAL LOW (ref 12.0–16.0)

## 2024-11-08 LAB — BASIC METABOLIC PANEL WITH GFR
Anion gap: 10 (ref 5–15)
BUN: 14 mg/dL (ref 6–20)
CO2: 28 mmol/L (ref 22–32)
Calcium: 7.5 mg/dL — ABNORMAL LOW (ref 8.9–10.3)
Chloride: 98 mmol/L (ref 98–111)
Creatinine, Ser: 0.37 mg/dL — ABNORMAL LOW (ref 0.44–1.00)
GFR, Estimated: >60 mL/min (ref >60)
Glucose, Bld: 129 mg/dL — ABNORMAL HIGH (ref 70–99)
Potassium: 4.1 mmol/L (ref 3.5–5.1)
Sodium: 136 mmol/L (ref 135–145)

## 2024-11-08 LAB — HEPARIN LEVEL (UNFRACTIONATED): Heparin Unfractionated: 0.55 [IU]/mL (ref 0.30–0.70)

## 2024-11-08 LAB — CBC
HCT: 23 % — ABNORMAL LOW (ref 36.0–46.0)
Hemoglobin: 7.6 g/dL — ABNORMAL LOW (ref 12.0–15.0)
MCH: 29 pg (ref 26.0–34.0)
MCHC: 33 g/dL (ref 30.0–36.0)
MCV: 87.8 fL (ref 80.0–100.0)
Platelets: 282 K/uL (ref 150–400)
RBC: 2.62 MIL/uL — ABNORMAL LOW (ref 3.87–5.11)
RDW: 14.6 % (ref 11.5–15.5)
WBC: 10.5 K/uL (ref 4.0–10.5)
nRBC: 0 % (ref 0.0–0.2)

## 2024-11-08 LAB — MAGNESIUM: Magnesium: 1.7 mg/dL (ref 1.7–2.4)

## 2024-11-08 MED ORDER — VITAL AF 1.2 CAL PO LIQD
1000.0000 mL | ORAL | Status: DC
  Administered 2024-11-08 – 2024-11-10 (×3): 1000 mL
  Filled 2024-11-08 (×4): qty 1000

## 2024-11-08 MED ORDER — CARMEX CLASSIC LIP BALM EX OINT
TOPICAL_OINTMENT | CUTANEOUS | Status: DC | PRN
  Administered 2024-11-08: 1 via TOPICAL
  Filled 2024-11-08: qty 10

## 2024-11-08 MED ORDER — MAGNESIUM SULFATE 4 GM/100ML IV SOLN
4.0000 g | Freq: Once | INTRAVENOUS | Status: AC
  Administered 2024-11-08: 4 g via INTRAVENOUS
  Filled 2024-11-08: qty 100

## 2024-11-08 MED ORDER — MAGNESIUM SULFATE 2 GM/50ML IV SOLN
2.0000 g | Freq: Once | INTRAVENOUS | Status: AC
  Administered 2024-11-08: 2 g via INTRAVENOUS
  Filled 2024-11-08: qty 50

## 2024-11-08 NOTE — [place] FL2 (Signed)
 [place]  MEDICAID FL2 LEVEL OF CARE FORM     IDENTIFICATION  Patient Name: Morgan [name] Birthdate: 1966/08/31 Sex: female Admission Date (Current Location): 10/29/2024  [facility] and [place] Number:  Producer, Television/film/video and Address:         Provider Number: [id number]  Attending Physician Name and Address:  Lue Elsie BROCKS, MD  Relative Name and Phone Number:  Lynwood Harlene BROCKS (Niece)  [phone]    Current Level of Care: Hospital Recommended Level of Care: Skilled Nursing Facility Prior Approval Number:    Date Approved/Denied:   PASRR Number: [id number]  Discharge Plan:      Current Diagnoses: Patient Active Problem List   Diagnosis Date Noted   Protein-calorie malnutrition, severe 11/05/2024   Malnutrition of moderate degree 10/30/2024   Spondylolysis of lumbar region 09/09/2022   Osteoarthritis of left shoulder 08/11/2020   S/P shoulder replacement, left 08/11/2020   AMS (altered mental status) 05/19/2019   SBO (small bowel obstruction) s/p lap LOA 03/19/2019 03/19/2019   Reactive hypoglycemia 02/12/2019   Chronic migraine without aura, with intractable migraine, so stated, with status migrainosus 11/20/2018   Lumbar radiculopathy 10/11/2018   Atelectasis    Hypoglycemia    Hypotension    Bradycardia    Epigastric pain 06/19/2018   History of adenomatous polyp of colon 10/23/2017   Hyperlipidemia associated with type 2 diabetes mellitus (HCC) 07/06/2017   GERD (gastroesophageal reflux disease) 03/21/2017   History of Roux-en-Y gastric bypass 2018 03/21/2017   Intractable chronic migraine without aura 06/04/2015   Abnormal uterine bleeding 05/13/2015   Pancreatitis 02/27/2014   Fatty liver disease, nonalcoholic 02/25/2014   Metabolic syndrome 02/20/2014   Vitamin D  deficiency    DM (diabetes mellitus) (HCC) 03/12/2013   Surgery, elective 06/12/2011   Hypothyroidism    Constipation, chronic    Vitamin B 12 deficiency    Allergic rhinitis  12/31/2010   BARRETTS ESOPHAGUS 12/31/2010   Gastroparesis 12/31/2010   Bilateral chronic knee pain 12/31/2010   Obstructive sleep apnea 09/27/2010   Migraine 09/27/2010   Cyst of ovary 10/19/2009    Orientation RESPIRATION BLADDER Height & Weight     Self, Time, Situation, Place  Normal Continent (oliguria) Weight: 158 lb 4.6 oz (71.8 kg) Height:  5' 4 (162.6 cm)  BEHAVIORAL SYMPTOMS/MOOD NEUROLOGICAL BOWEL NUTRITION STATUS      Incontinent Diet, Feeding tube (peg tube)  AMBULATORY STATUS COMMUNICATION OF NEEDS Skin     Verbally Other (Comment), Wound Vac (wound vac - abdomen ; deep tissue pressure injury - coccyx - foam ; surgical incision Lwrist - gauze ; surgical incision L arm - honeycomb ; wound - throat)                       Personal Care Assistance Level of Assistance  Bathing, Feeding, Dressing Bathing Assistance: Limited assistance Feeding assistance: Limited assistance Dressing Assistance: Limited assistance     Functional Limitations Info             SPECIAL CARE FACTORS FREQUENCY  PT (By licensed PT), OT (By licensed OT)     PT Frequency: 5 times per week OT Frequency: 5 times per week            Contractures      Additional Factors Info  Code Status, Allergies Code Status Info: full Allergies Info: Ketoconazole, Pravachol  (Pravastatin  Sodium), Statins, Tape, Repatha  (Evolocumab ), Codeine           Current Medications (11/08/2024):  This is the current hospital active medication list Current Facility-Administered Medications  Medication Dose Route Frequency Provider Last Rate Last Admin   acetaminophen  (TYLENOL ) tablet 650 mg  650 mg Per Tube Q6H Claudene Toribio BROCKS, MD   650 mg at 11/08/24 1223   Chlorhexidine  Gluconate Cloth 2 % PADS 6 each  6 each Topical Daily Tanda Locus, MD   6 each at 11/08/24 0849   copper  chloride 2 mg in dextrose  5 % 100 mL IVPB  2 mg Intravenous Daily Claudene Toribio BROCKS, MD 50 mL/hr at 11/08/24 1046 2 mg at 11/08/24  1046   feeding supplement (PROSource TF20) liquid 60 mL  60 mL Per Tube Daily Signe Cree A, MD   60 mL at 11/08/24 0848   feeding supplement (VITAL AF 1.2 CAL) liquid 1,000 mL  1,000 mL Per Tube Continuous Signe Cree LABOR, MD 60 mL/hr at 11/08/24 0910 1,000 mL at 11/08/24 0910   furosemide  (LASIX ) tablet 40 mg  40 mg Per Tube Daily Bensimhon, Daniel R, MD   40 mg at 11/08/24 0848   heparin  ADULT infusion 100 units/mL (25000 units/250mL)  1,750 Units/hr Intravenous Continuous Lola Maurilio PENNER, RPH 17.5 mL/hr at 11/08/24 9367 1,750 Units/hr at 11/08/24 9367   HYDROmorphone  (DILAUDID ) injection 0.5 mg  0.5 mg Intravenous Q3H PRN Claudene Toribio BROCKS, MD   0.5 mg at 11/08/24 1352   insulin  aspart (novoLOG ) injection 0-15 Units  0-15 Units Subcutaneous Q4H Claudene Toribio BROCKS, MD   2 Units at 11/08/24 9149   levothyroxine  (SYNTHROID ) tablet 112 mcg  112 mcg Per Tube QAC breakfast Autry, Lauren E, PA-C   112 mcg at 11/08/24 9367   magnesium  sulfate IVPB 4 g 100 mL  4 g Intravenous Once Colletta Manuelita Garre, PA-C 50 mL/hr at 11/08/24 1355 4 g at 11/08/24 1355   Oral care mouth rinse  15 mL Mouth Rinse 4 times per day Claudene Toribio BROCKS, MD   15 mL at 11/08/24 1228   Oral care mouth rinse  15 mL Mouth Rinse PRN Claudene Toribio BROCKS, MD   15 mL at 11/05/24 2023   oxyCODONE  (Oxy IR/ROXICODONE ) immediate release tablet 5-10 mg  5-10 mg Per Tube Q4H PRN Johnson, Kelly R, PA-C   10 mg at 11/08/24 9150   pantoprazole  (PROTONIX ) injection 40 mg  40 mg Intravenous Q12H Wilson, Eric, MD   40 mg at 11/08/24 9085   sertraline  (ZOLOFT ) tablet 200 mg  200 mg Per Tube Daily Lue Elsie BROCKS, MD   200 mg at 11/08/24 0848   sodium chloride  flush (NS) 0.9 % injection 10-40 mL  10-40 mL Intracatheter PRN Tanda Locus, MD       spironolactone  (ALDACTONE ) tablet 12.5 mg  12.5 mg Per Tube Daily Bensimhon, Toribio SAUNDERS, MD   12.5 mg at 11/08/24 0848   thiamine  (VITAMIN B1) injection 100 mg  100 mg Intravenous Q24H Gretta Doffing P, DO   100  mg at 11/08/24 9149   zinc  sulfate (50mg  elemental zinc ) capsule 220 mg  220 mg Per Tube Daily Claudene Toribio BROCKS, MD   220 mg at 11/08/24 0848     Discharge Medications: Please see discharge summary for a list of discharge medications.  Relevant Imaging Results:  Relevant Lab Results:   Additional Information SS #: [id number]  Farouk Vivero E Roosvelt Churchwell, LCSW

## 2024-11-08 NOTE — Progress Notes (Signed)
 Occupational Therapy Treatment Patient Details Name: Morgan [name] MRN: [id number] DOB: 11/19/1966 Today's Date: 11/08/2024   History of present illness 58 yo F adm 10/29/24 with SBO s/p ex lap with g-tube placement same date. 11/20 and 11/22 repeat ex lap with VAC. VDRF 11/18-11/24. Pressor support with thrombosed radial artery s/p Lt radial artery embolectomy11/22. PMH: SBO, gastric bypass, DM, OSA, HLD, hypotension, bradycardia, Lt TSA   OT comments  Focus of session on L hand ROM and further assessment of use of L hand. Pt with apparent motor dysfunction in the median n distribution and is developing claw hand  in addition to tightness in MP flexion. Pt would most likely benefit from L MP blocking splint and resting hand splint in intrinsic plus position - will reassess and possibly fabricate splints next week (received verbal OK form Dr Lue). Nsg to keep L hand elevated and encourage use of foam and self ROM into L MP and composite flexion.       If plan is discharge home, recommend the following:  Two people to help with walking and/or transfers;A lot of help with bathing/dressing/bathroom;Assistance with cooking/housework;Direct supervision/assist for medications management;Direct supervision/assist for financial management;Assist for transportation;Help with stairs or ramp for entrance   Equipment Recommendations  Other (comment)    Recommendations for Other Services      Precautions / Restrictions Precautions Precautions: Fall;Other (comment) Recall of Precautions/Restrictions: Impaired Precaution/Restrictions Comments: Abd VAC, g-tube, foley, PICC Restrictions Weight Bearing Restrictions Per Provider Order: No              ADL either performed or assessed with clinical judgement   ADL Overall ADL's : Needs assistance/impaired     Grooming: Minimal assistance Grooming Details (indicate cue type and reason): states nsg is brushing her teeth. Encouraged pt  to do more activities for herself                                    Extremity/Trunk Assessment Upper Extremity Assessment Upper Extremity Assessment: LUE deficits/detail LUE Deficits / Details: thumb wrapped in gauze bandage, per MD, discoloration to MP of thumb; apparent dysfunction in median n distribution; unable to adduvt, oppose thumb/ index/middle, no DIP/PIP flexion of index and middle fingers, developing tight MP joints and claw hand. Full composite ROM after intervention.encouraged use of foam cube LUE Coordination: decreased fine motor   Lower Extremity Assessment Lower Extremity Assessment: Defer to PT evaluation                 Communication Communication Communication: No apparent difficulties   Cognition Arousal: Alert Behavior During Therapy: Flat affect Cognition: Cognition impaired     Awareness: Intellectual awareness intact, Online awareness impaired Memory impairment (select all impairments): Working civil service fast streamer, Conservation officer, historic buildings Attention impairment (select first level of impairment): Selective attention Executive functioning impairment (select all impairments): Reasoning, Problem solving                            Cueing      Exercises Exercises: Hand exercises Hand Exercises Wrist Flexion: AROM, Left, 10 reps Wrist Extension: AROM, AAROM, 10 reps (prayer stretch) Wrist Ulnar Deviation: AROM, AAROM, Left, 10 reps Wrist Radial Deviation: AROM, AAROM, Left, 10 reps Composite Extension: AROM, AAROM, Left, 10 reps (claw hand) Digit Composite Abduction: AROM, Left, 10 reps Digit Composite Adduction: AROM, AAROM, PROM, Left, 15 reps Digit Lifts: AROM, Left,  10 reps Thumb Abduction: AROM, Left, 10 reps Thumb Adduction: PROM, AAROM, Left, 15 reps Opposition: PROM, Left, 10 reps (unable to oppose ring or little) Other Exercises Other Exercises: pink foam squeeze x 10- 3 sets    Shoulder Instructions       General  Comments      Pertinent Vitals/ Pain       Pain Assessment Pain Assessment: Faces Faces Pain Scale: Hurts a little bit Pain Location: abdomen Pain Descriptors / Indicators: Grimacing, Guarding Pain Intervention(s): Limited activity within patient's tolerance  Home Living                                          Prior Functioning/Environment              Frequency  Min 3X/week        Progress Toward Goals  OT Goals(current goals can now be found in the care plan section)  Progress towards OT goals: Progressing toward goals  Acute Rehab OT Goals Patient Stated Goal: imiprove use of L hand OT Goal Formulation: With patient Time For Goal Achievement: 11/18/24 Potential to Achieve Goals: Good ADL Goals Pt Will Perform Upper Body Dressing: with supervision;sitting Pt Will Perform Lower Body Dressing: with min assist;sitting/lateral leans;sit to/from stand Pt Will Transfer to Toilet: with min assist;stand pivot transfer;bedside commode Pt/caregiver will Perform Home Exercise Program: Increased ROM;Increased strength;Both right and left upper extremity;With minimal assist;With written HEP provided Additional ADL Goal #1: Pt will perform bed mobility min A in prep for seated ADLs  Plan      Co-evaluation                 AM-PAC OT 6 Clicks Daily Activity     Outcome Measure   Help from another person eating meals?: A Lot Help from another person taking care of personal grooming?: A Lot Help from another person toileting, which includes using toliet, bedpan, or urinal?: Total Help from another person bathing (including washing, rinsing, drying)?: A Lot Help from another person to put on and taking off regular upper body clothing?: A Lot Help from another person to put on and taking off regular lower body clothing?: Total 6 Click Score: 10    End of Session Equipment Utilized During Treatment: Oxygen   OT Visit Diagnosis: Unsteadiness on feet  (R26.81);Other abnormalities of gait and mobility (R26.89);Muscle weakness (generalized) (M62.81)   Activity Tolerance Patient tolerated treatment well   Patient Left in bed;with call bell/phone within reach;with bed alarm set   Nurse Communication Other (comment) (positioning L hand - keep elevated ; encourage use of squeeze foam)        Time: 9077-9052 OT Time Calculation (min): 25 min  Charges: OT General Charges $OT Visit: 1 Visit OT Treatments $Therapeutic Exercise: 23-37 mins  Kreg Sink, OT/L   Acute OT Clinical Specialist Acute Rehabilitation Services Pager [phone] Office [phone]   [facility] 11/08/2024, 10:13 AM

## 2024-11-08 NOTE — Progress Notes (Addendum)
 Progress Note  6 Days Post-Op  Subjective: Continues to have bowel movements, tolerating tube feeds without any nausea.  Mild abdominal cramping still.  Objective: Vital signs in last 24 hours: Temp:  [98 F (36.7 C)-99 F (37.2 C)] 99 F (37.2 C) (11/27 1912) Pulse Rate:  [73-82] 74 (11/28 0400) Resp:  [14-19] 14 (11/28 0400) BP: (81-99)/(57-62) 88/57 (11/28 0400) SpO2:  [95 %-100 %] 100 % (11/28 0400) Weight:  [71.8 kg] 71.8 kg (11/28 0451) Last BM Date : 11/07/24  Intake/Output from previous day: 11/27 0701 - 11/28 0700 In: 1759.9 [I.V.:428.2; NG/GT:1231.7; IV Piggyback:100] Out: 1600 [Urine:1600] Intake/Output this shift: No intake/output data recorded.  PE: General: NAD HEENT: sclera anicteric Heart: regular, rate, and rhythm.   Lungs: Respiratory effort nonlabored Abd: soft, nondistended, appropriately ttp, G-tube present with sutures in place, midline wound with vac in place good seal, photo below from 11/25     Lab Results:  Recent Labs    11/07/24 0440 11/08/24 0501  WBC 11.3* 10.5  HGB 7.9* 7.6*  HCT 23.9* 23.0*  PLT 246 282   BMET Recent Labs    11/07/24 0440 11/08/24 0501  NA 136 136  K 4.2 4.1  CL 97* 98  CO2 32 28  GLUCOSE 120* 129*  BUN 18 14  CREATININE 0.47 0.37*  CALCIUM  7.4* 7.5*   PT/INR No results for input(s): LABPROT, INR in the last 72 hours. CMP     Component Value Date/Time   NA 136 11/08/2024 0501   NA 142 09/23/2024 1030   K 4.1 11/08/2024 0501   CL 98 11/08/2024 0501   CO2 28 11/08/2024 0501   GLUCOSE 129 (H) 11/08/2024 0501   BUN 14 11/08/2024 0501   BUN 17 09/23/2024 1030   CREATININE 0.37 (L) 11/08/2024 0501   CREATININE 0.82 06/18/2013 1009   CALCIUM  7.5 (L) 11/08/2024 0501   PROT 4.1 (L) 11/04/2024 0500   PROT 6.5 06/28/2024 0842   ALBUMIN  <1.5 (L) 11/04/2024 0500   ALBUMIN  4.5 06/28/2024 0842   AST 88 (H) 11/04/2024 0500   ALT 64 (H) 11/04/2024 0500   ALKPHOS 48 11/04/2024 0500   BILITOT 0.8  11/04/2024 0500   BILITOT 0.3 06/28/2024 0842   GFRNONAA >60 11/08/2024 0501   GFRNONAA 87 06/18/2013 1009   GFRAA >60 08/04/2020 1038   GFRAA >89 06/18/2013 1009   Lipase     Component Value Date/Time   LIPASE 22 11/01/2024 0450       Studies/Results: No results found.  Anti-infectives: Anti-infectives (From admission, onward)    Start     Dose/Rate Route Frequency Ordered Stop   10/31/24 1400  piperacillin -tazobactam (ZOSYN ) IVPB 3.375 g        3.375 g 12.5 mL/hr over 240 Minutes Intravenous Every 8 hours 10/31/24 0751 11/08/24 0029   10/31/24 1100  micafungin  (MYCAMINE ) 100 mg in sodium chloride  0.9 % 100 mL IVPB        100 mg 105 mL/hr over 1 Hours Intravenous Every 24 hours 10/31/24 0901 11/04/24 1354   10/30/24 0600  piperacillin -tazobactam (ZOSYN ) IVPB 3.375 g  Status:  Discontinued        3.375 g 12.5 mL/hr over 240 Minutes Intravenous Every 8 hours 10/29/24 2350 10/31/24 0751   10/29/24 2145  piperacillin -tazobactam (ZOSYN ) IVPB 3.375 g        3.375 g 100 mL/hr over 30 Minutes Intravenous  Once 10/29/24 2137 10/29/24 2205   10/29/24 1700  cefTRIAXone  (ROCEPHIN ) 1 g in sodium chloride   0.9 % 100 mL IVPB  Status:  Discontinued        1 g 200 mL/hr over 30 Minutes Intravenous  Once 10/29/24 1646 10/29/24 1656   10/29/24 1700  metroNIDAZOLE  (FLAGYL ) IVPB 500 mg        500 mg 100 mL/hr over 60 Minutes Intravenous  Once 10/29/24 1646 10/29/24 1809   10/29/24 1700  cefTRIAXone  (ROCEPHIN ) 2 g in sodium chloride  0.9 % 100 mL IVPB        2 g 200 mL/hr over 30 Minutes Intravenous  Once 10/29/24 1656 10/29/24 1733        Assessment/Plan POD 10, s/p ex lap with LOA, g-tube placement in gastric remnant Dr. Vernetta 11/18 secondary to SBO with possible perforation POD 8, s/p ex lap with washout, Dr. Tanda 11/20 POD 6, s/p washout, abdominal closure Dr. Tanda 11/22 - cont zosyn , BC 2/4 positive for E coli.  Completed antifungal course and completed stress dose steroid  course - continue VAC T/F- VAC change due today  - hgb 7.6 this am, rec'd 1 U PRBC 11/25   FEN - IVFs per CCM, increase TF to 60mL/hr (goal). Prosource/supplements per RD recs.  Clear liquids only PO per SLP.  VTE - heparin  gtt ID - micafungin  5 doses completed; Zosyn  11/20>> 11/28   - per CCM -  E coli bacteremia -completed 1 week of Zosyn  Septic shock - off pressors, improving  CAD/Heart failure - EF 20-25% on echo 11/21 AKI - resolved DM - SSI Leukopenia - resolved, secondary to sepsis Thrombocytopenia - resolved Hypocalcemia - per CCM Neck wound - unclear etiology, have asked nursing to place mepilex over this to protect it.  Avoid dressings or lines in this area. L hand ischemic changes -  VVS following, s/p embolectomy radial artery and repair, likely will need more surgery. Hand surgery consulted   DISPO- CIR evaluating   LOS: 10 days   Mitzie DELENA Freund, MD  [facility] Surgery 11/08/2024, 7:45 AM Please see Amion for pager number during day hours 7:00am-4:30pm

## 2024-11-08 NOTE — TOC Progression Note (Signed)
 Transition of Care [facility]) - Progression Note    Patient Details  Name: [name] MRN: [id number] Date of Birth: 1966/01/28  [facility] CM/SW Contact  Tarig Zimmers E Wyndham Santilli, LCSW Phone Number: 11/08/2024, 2:27 PM  Clinical Narrative:    CIR declined.  SNF work up started per patient/family request.   Expected Discharge Plan: Home/Self Care Barriers to Discharge: Continued Medical Work up               Expected Discharge Plan and Services In-house Referral:  (rw) Discharge Planning Services: CM Consult   Living arrangements for the past 2 months: Single Family Home                                       Social Drivers of Health (SDOH) Interventions SDOH Screenings   Food Insecurity: Patient Unable To Answer (11/02/2024)  Housing: Patient Unable To Answer (11/02/2024)  Transportation Needs: Patient Unable To Answer (11/02/2024)  Utilities: Patient Unable To Answer (11/02/2024)  Alcohol  Screen: Low Risk  (09/23/2024)  Depression (PHQ2-9): Medium Risk (06/28/2024)  Financial Resource Strain: Medium Risk (09/23/2024)  Physical Activity: Insufficiently Active (09/23/2024)  Social Connections: Patient Unable To Answer (11/02/2024)  Recent Concern: Social Connections - Moderately Isolated (09/23/2024)  Stress: Stress Concern Present (09/23/2024)  Tobacco Use: Low Risk  (10/29/2024)  Health Literacy: Adequate Health Literacy (09/23/2024)    Readmission Risk Interventions     No data to display

## 2024-11-08 NOTE — Progress Notes (Signed)
 PHARMACY - ANTICOAGULATION CONSULT NOTE  Pharmacy Consult for heparin  infusion Indication: acute radial artery occlusion/ischemic L hand  Patient Measurements: Height: 5' 4 (162.6 cm) Weight: 71.8 kg (158 lb 4.6 oz) IBW/kg (Calculated) : 54.7 HEPARIN  DW (KG): 59.9  Vital Signs: Temp: 98.6 F (37 C) (11/28 0758) Temp Source: Oral (11/28 0758) BP: 148/99 (11/28 0904) Pulse Rate: 89 (11/28 0904)  Labs: Recent Labs    11/06/24 0436 11/06/24 0824 11/07/24 0440 11/07/24 1350 11/07/24 2059 11/08/24 0501 11/08/24 0502  HGB 8.6*  --  7.9*  --   --  7.6*  --   HCT 26.8*  --  23.9*  --   --  23.0*  --   PLT 199  --  246  --   --  282  --   HEPARINUNFRC  --    < > 0.22* 0.27* 0.31  --  0.55  CREATININE 0.46  --  0.47  --   --  0.37*  --    < > = values in this interval not displayed.    Estimated Creatinine Clearance: 75.3 mL/min (A) (by C-G formula based on SCr of 0.37 mg/dL (L)).   Assessment: 58 yo F presenting with E. Coli bacteremia 2/2 SBO with perforation. On 11/22 underwent closure of abdomen in OR and repair of left radial artery. Not on AC PTA. Pharmacy consulted for heparin  management.  Received 3 units of platelets 11/22.  Heparin  level is subtherapeutic at 0.55, on 1750 units/hr. Hgb 7.6, plt 282. No s/sx of bleeding or infusion issues.   Goal of Therapy:  Heparin  level 0.3-0.5 units/ml Monitor platelets by anticoagulation protocol: Yes   Plan:  Continue IV heparin  at 1750 units/hr Monitor daily heparin  level, CBC, and for s/sx of bleeding   Thank you for allowing pharmacy to participate in this patient's care,  Harlene Denna Berdine JONETTA ARABELLA, [facility] Clinical Pharmacist  11/08/2024 11:28 AM   [facility] pharmacy phone numbers are listed on [email or web address]

## 2024-11-08 NOTE — Progress Notes (Signed)
 Physical Therapy Treatment Patient Details Name: Morgan [name] MRN: [id number] DOB: 06/30/1966 Today's Date: 11/08/2024   History of Present Illness 58 yo F adm 10/29/24 with SBO s/p ex lap with g-tube placement same date. 11/20 and 11/22 repeat ex lap with VAC. VDRF 11/18-11/24. Pressor support with thrombosed radial artery s/p Lt radial artery embolectomy11/22. PMH: SBO, gastric bypass, DM, OSA, HLD, hypotension, bradycardia, Lt TSA    PT Comments  Pt pleasant and incontinent of loose stool on arrival. Min assist to roll with total assist for pericare. Pt with improved transfers and able to stand and start stepping away from surface this date with RUE support. Pt fatigued end of session with return to bed per RN request due to pending VAC change. Encouraged continued mobility and progression with staff and will continue to follow. Patient will benefit from intensive inpatient follow-up therapy, >3 hours/day   BP supine 107/61 (75) Sitting 148/99 (113) HR 89 SPo2 93% RA    If plan is discharge home, recommend the following: A little help with walking and/or transfers;Assist for transportation;Assistance with cooking/housework;Help with stairs or ramp for entrance;A little help with bathing/dressing/bathroom   Can travel by private vehicle        Equipment Recommendations  BSC/3in1;Rollator (4 wheels)    Recommendations for Other Services       Precautions / Restrictions Precautions Precautions: Fall;Other (comment) Recall of Precautions/Restrictions: Impaired Precaution/Restrictions Comments: Abd VAC, g-tube, foley, PICC Restrictions Weight Bearing Restrictions Per Provider Order: No Other Position/Activity Restrictions: did not weight bear through LUE, however no formal orders from embolectomy     Mobility  Bed Mobility Overal bed mobility: Needs Assistance Bed Mobility: Rolling Rolling: Min assist Sidelying to sit: Mod assist   Sit to supine: Mod assist   General  bed mobility comments: roll min assist with sequential cues and rail x 2 bil, mod assist to clear legs and elevate trunk, assist to lift legs back to surface    Transfers Overall transfer level: Needs assistance   Transfers: Sit to/from Stand Sit to Stand: Min assist, +2 safety/equipment           General transfer comment: min assist with RUE hooking therapist to stand from EOb x 2 trials. Pt able to side step along bed right and left. 2nd trials stepped forward and back grossly 4' each trial. +2 assist for lines and safety, chair follow for progressive gait encouraged. RN denied OOB at this time due to incontinent stool and pending VAC change    Ambulation/Gait                   Stairs             Wheelchair Mobility     Tilt Bed    Modified Rankin (Stroke Patients Only)       Balance Overall balance assessment: Needs assistance Sitting-balance support: Feet supported, No upper extremity supported Sitting balance-Leahy Scale: Fair Sitting balance - Comments: CGA for EOB   Standing balance support: Single extremity supported Standing balance-Leahy Scale: Poor Standing balance comment: UE support in standing                            Communication Communication Communication: No apparent difficulties  Cognition Arousal: Alert Behavior During Therapy: Flat affect   PT - Cognitive impairments: Orientation   Orientation impairments: Time  Following commands: Impaired Following commands impaired: Follows one step commands with increased time    Cueing Cueing Techniques: Verbal cues, Tactile cues, Gestural cues  Exercises      General Comments        Pertinent Vitals/Pain Pain Assessment Pain Score: 3  Pain Location: abdomen Pain Descriptors / Indicators: Grimacing, Guarding Pain Intervention(s): Limited activity within patient's tolerance, Monitored during session, Repositioned    Home Living                           Prior Function            PT Goals (current goals can now be found in the care plan section) Progress towards PT goals: Progressing toward goals    Frequency    Min 3X/week      PT Plan      Co-evaluation              AM-PAC PT 6 Clicks Mobility   Outcome Measure  Help needed turning from your back to your side while in a flat bed without using bedrails?: A Little Help needed moving from lying on your back to sitting on the side of a flat bed without using bedrails?: A Lot Help needed moving to and from a bed to a chair (including a wheelchair)?: A Lot Help needed standing up from a chair using your arms (e.g., wheelchair or bedside chair)?: A Little Help needed to walk in hospital room?: A Lot Help needed climbing 3-5 steps with a railing? : Total 6 Click Score: 13    End of Session   Activity Tolerance: Patient tolerated treatment well;Patient limited by fatigue Patient left: in bed;with call bell/phone within reach;with bed alarm set;with nursing/sitter in room Nurse Communication: Mobility status;Precautions PT Visit Diagnosis: Other abnormalities of gait and mobility (R26.89);Muscle weakness (generalized) (M62.81);Difficulty in walking, not elsewhere classified (R26.2)     Time: 9194-9168 PT Time Calculation (min) (ACUTE ONLY): 26 min  Charges:    $Therapeutic Activity: 23-37 mins PT General Charges $$ ACUTE PT VISIT: 1 Visit                     Lenoard SQUIBB, PT Acute Rehabilitation Services Office: [phone]    Lenoard NOVAK Raylan Troiani 11/08/2024, 9:55 AM

## 2024-11-08 NOTE — Progress Notes (Signed)
 Speech Language Pathology Treatment: Dysphagia  Patient Details Name: Morgan [name] MRN: [id number] DOB: 18-Jul-1966 Today's Date: 11/08/2024 Time: 8744-8694 SLP Time Calculation (min) (ACUTE ONLY): 10 min  Assessment / Plan / Recommendation Clinical Impression  Pt's mentation continues to improve, actively interested in POs and making her needs known. She grows uncomfortable when sitting upright due to abdominal pain so she was partially reclined. She fed herself pudding and water  with prompt oral transit. No s/s of aspiration were observed with large volumes. Discussed with MD and will advance to full liquids. SLP will sign off acutely and diet can be advanced as medically able.    HPI HPI: 58 yo female presenting 11/18 with abdominal pain. Found to have SBO with possible perforation. S/p ex lap with LOA, G-tube placement 11/18, ex lap with washout 11/20, and washout with abdominal closure and wound vac placement 11/22. Noted ischemic changes of the L hand, orthopedics following. ETT 11/18-11/24. PMH: SBO (2020), roux-en-Y gastric bypass, DM, GERD, Barrett's esophagus      SLP Plan  All goals met          Recommendations  Diet recommendations: Other(comment) (full liquids) Liquids provided via: Teaspoon;Cup;Straw Medication Administration: Whole meds with puree Supervision: Staff to assist with self feeding;Full supervision/cueing for compensatory strategies Compensations: Minimize environmental distractions;Slow rate;Small sips/bites Postural Changes and/or Swallow Maneuvers: Seated upright 90 degrees                  Oral care BID   PRN Dysphagia, unspecified (R13.10)     All goals met     Damien Blumenthal, M.A., CCC-SLP Speech Language Pathology, Acute Rehabilitation Services  Secure Chat preferred [phone]   11/08/2024, 2:08 PM

## 2024-11-08 NOTE — Consult Note (Addendum)
 WOC Nurse Consult follow-up Note: Reason for Consult:Vac dressing changed to midline abd full thickness post-op wound. Refer to previous notes for measurements, and sacrum wound was assessed on 11/25.  Pt was medicated for pain prior to the procedure and tolerated with mod amt discomfort.  Wound is approx 85% pale red with 15% yellow interspersed throughout.  Applied one piece black foam to cont suction.    3 sets of Vac supplies ordered to the room for use. WOC team will plan to change again on Tues.   Thank-you,  Stephane Fought MSN, RN, CWOCN, CWCN-AP, CNS Contact Mon-Fri 0700-1500: [phone]

## 2024-11-08 NOTE — Progress Notes (Signed)
 PROGRESS NOTE    Morgan [name]  FMW:[id number] DOB: August 13, 1966 DOA: 10/29/2024 PCP: [name], DO   Brief Narrative:  Morgan [name] is a 58 year old female with history of Roux-en-Y gastric bypass who presented to the emergency department with abdominal pain, lactic acidosis and lipase greater than 2800.  She had CCS evaluation and went for exploratory laparotomy.  Found to have dense adhesive band that was obstructing her proximal small bowel as well as biliary leak without evidence of perforation.  She had a gastrostomy tube placed with a wound VAC.  Initially on Levophed , vasopressin , intubated on bicarb drip postoperatively.  Patient's septic shock continue to improve, off pressors, NG tube remains in place, postoperative care per general surgery including diet advancement.  Patient to complete antibiotics on 11/27, completed antifungals 11/25. Left thumb ischemia status post thrombectomy appears to be stable, vascular surgery following. Patient's chronic comorbid conditions otherwise appear to be improving if not stable.  Assessment & Plan:   Principal Problem:   SBO (small bowel obstruction) s/p lap LOA 03/19/2019 Active Problems:   Malnutrition of moderate degree   Protein-calorie malnutrition, severe  Septic shock due to perforated SBO with peritonitis, E Coli bacteremia SBO due to adhesion Status post ex lap with lysis of adhesions 11/18; washouts on 11/20 and abdominal closure on 11/22 -wound VAC replacement/exchange 11/28 G-tube in gastric remnant History of Roux-en-Y bariatric surgery - TPN off as of 11/27, tube feeds ongoing with clear diet, defer to general surgery for ongoing diet management - zosyn  completed for ecoli bactermia; micafungin  completed 11/25 for peritonitis    Acute HFrEF- concerning given CAD hx vs septic cardiomyopathy  - Off dobutamine , avoid pressors given above - GDMT on hold due to hypotension per heart failure team -start low dose Lasix  -  Cardiac cath pending further recovery   Critical left hand ischemia due to thrombosed radial artery, s/p thrombectomy - Left radial arterial line discontinued 11/20, status post radial artery embolectomy with vascular surgery who notes left thumb necrosis distal to MP joint, defer to vascular surgery will allow this to declare then plan for amputation. - Continue heparin  drip   Urinary retention, improving Acute kidney injury, resolved - Foley previously removed with noted difficulty voiding over the past 24h - plan to replace foley catheter later today if still unable to void/requiring repeated in/out cath - Strict I/O -renally dose meds, avoid nephrotoxic meds  Thrombocytopenia due to sepsis, resolved - no need for platelet transfusion; monitor   Acute anemia, recovering - Presumed blood loss vs worsening chronic anemia of chronic disease/aplastic anemia - Hemoglobin improved to 8.6 after transfusion, 1 unit 11/25   Acute hypoxic respiratory failure postoperatively RLL PNA- suspect aspiration from SBO - extubated successfully 11/24 - wean O2 for sat >92%  - IS and mobilize as able   Hypoglycemia  H/o DM2 but  A1c 5.3 and not on meds PTA-- resolved after Roux-en-Y? -monitor; not needing insulin  - watch with stopping steroids   Hypothyroidism - con't synthroid    Elevated transaminases, hyperbilirubinemia - stabilizing  - bilirubin back to normal    Depression/Anxiety  - Increase sertraline  back to home dose  Pressure injury noted, POA Wound 10/31/24 1716 Pressure Injury Coccyx Medial Deep Tissue Pressure Injury - Purple or maroon localized area of discolored intact skin or blood-filled blister due to damage of underlying soft tissue from pressure and/or shear. (Active)   DVT prophylaxis: SCD's Start: 10/29/24 2346 continues on heparin  drip Code Status:   Code Status:  Full Code Family Communication: None present  Status is: Inpatient  Dispo: The patient is from: Home               Anticipated d/c is to: To be determined              Anticipated d/c date is: To be determined              Patient currently not medically stable for discharge  Consultants:  PCCM, general surgery, vascular surgery  Antimicrobials:  Micafungin  completed Zosyn  completed  Subjective:   Objective: Vitals:   11/08/24 0020 11/08/24 0400 11/08/24 0451 11/08/24 0758  BP: (!) 81/62 (!) 88/57  105/70  Pulse:  74  79  Resp: 18 14  20   Temp:    98.6 F (37 C)  TempSrc:    Oral  SpO2: 95% 100%  97%  Weight:   71.8 kg   Height:        Intake/Output Summary (Last 24 hours) at 11/08/2024 0843 Last data filed at 11/08/2024 9367 Gross per 24 hour  Intake 1759.9 ml  Output 1600 ml  Net 159.9 ml   Filed Weights   11/04/24 0500 11/05/24 0700 11/08/24 0451  Weight: 71.6 kg 73 kg 71.8 kg    Examination:  General:  Pleasantly resting in bed, No acute distress. HEENT:  Normocephalic atraumatic.  Sclerae nonicteric, noninjected.  Extraocular movements intact bilaterally. Neck:  Without mass or deformity.  Trachea is midline. Lungs: Diminished without wheeze rales or rhonchi. Heart:  Regular rate and rhythm.  Without murmurs, rubs, or gallops. Abdomen: Nontender, G-tube noted, postoperative bandage clean dry intact Extremities: Without cyanosis, clubbing, edema, or obvious deformity.  Right upper extremity PICC line noted Skin:  Warm and dry, no erythema. Wound 10/31/24 1716 Pressure Injury Coccyx Medial Deep Tissue Pressure Injury - Purple or maroon localized area of discolored intact skin or blood-filled blister due to damage of underlying soft tissue from pressure and/or shear. (Active)    Data Reviewed: I have personally reviewed following labs and imaging studies  CBC: Recent Labs  Lab 11/04/24 0500 11/05/24 0337 11/05/24 1055 11/06/24 0436 11/07/24 0440 11/08/24 0501  WBC 10.4 9.1  --  11.3* 11.3* 10.5  HGB 7.3* 6.9* 7.9* 8.6* 7.9* 7.6*  HCT 22.3* 21.6* 24.7*  26.8* 23.9* 23.0*  MCV 87.8 88.9  --  89.0 87.9 87.8  PLT 87* 135*  --  199 246 282   Basic Metabolic Panel: Recent Labs  Lab 11/04/24 1729 11/05/24 0337 11/05/24 1055 11/05/24 1807 11/06/24 0436 11/06/24 1700 11/07/24 0440 11/08/24 0501  NA 135  --  133*  --  137  --  136 136  K 4.3  --  4.0  --  3.6  --  4.2 4.1  CL 90*  --  92*  --  97*  --  97* 98  CO2 35*  --  34*  --  33*  --  32 28  GLUCOSE 524*  --  414*  --  116*  --  120* 129*  BUN 27*  --  23*  --  17  --  18 14  CREATININE 0.60  --  0.56  --  0.46  --  0.47 0.37*  CALCIUM  7.1*  --  7.0*  --  7.3*  --  7.4* 7.5*  MG 2.3 2.0  --  1.8 2.0 1.6*  --  1.7  PHOS 2.8 2.2*  --  3.0 3.5 2.9  --   --  GFR: Estimated Creatinine Clearance: 75.3 mL/min (A) (by C-G formula based on SCr of 0.37 mg/dL (L)). Liver Function Tests: Recent Labs  Lab 11/01/24 0920 11/02/24 0355 11/03/24 0353 11/04/24 0500  AST  --  78*  78* 80* 88*  ALT  --  72*  72* 66* 64*  ALKPHOS  --  44  44 54 48  BILITOT 0.7 0.8  0.8 1.3* 0.8  PROT  --  3.8*  3.8* 4.2* 4.1*  ALBUMIN   --  <1.5*  <1.5* 1.6* <1.5*   No results for input(s): LIPASE, AMYLASE in the last 168 hours.  No results for input(s): AMMONIA in the last 168 hours. Coagulation Profile: Recent Labs  Lab 11/02/24 0147 11/02/24 0355 11/02/24 0853  INR 1.0 0.9 0.9   Cardiac Enzymes: No results for input(s): CKTOTAL, CKMB, CKMBINDEX, TROPONINI in the last 168 hours. BNP (last 3 results) Recent Labs    11/01/24 0920  PROBNP >35,000.0*   HbA1C: No results for input(s): HGBA1C in the last 72 hours. CBG: Recent Labs  Lab 11/07/24 1201 11/07/24 1621 11/07/24 2052 11/08/24 0454 11/08/24 0758  GLUCAP 115* 109* 128* 113* 126*   Lipid Profile: No results for input(s): CHOL, HDL, LDLCALC, TRIG, CHOLHDL, LDLDIRECT in the last 72 hours.  Thyroid  Function Tests: No results for input(s): TSH, T4TOTAL, FREET4, T3FREE, THYROIDAB in the  last 72 hours. Anemia Panel: No results for input(s): VITAMINB12, FOLATE, FERRITIN, TIBC, IRON , RETICCTPCT in the last 72 hours. Sepsis Labs: Recent Labs  Lab 11/01/24 1544 11/02/24 0355  LATICACIDVEN 1.4 1.1    Recent Results (from the past 240 hours)  Culture, blood (Routine x 2)     Status: Abnormal   Collection Time: 10/29/24  5:36 PM   Specimen: BLOOD LEFT WRIST  Result Value Ref Range Status   Specimen Description   Final    BLOOD LEFT WRIST Performed at [facility], [address], [place], [place] [address]    Special Requests   Final    BOTTLES DRAWN AEROBIC ONLY Blood Culture results may not be optimal due to an inadequate volume of blood received in culture bottles Performed at [facility], [address], [place], [place] [address]    Culture  Setup Time   Final    GRAM NEGATIVE RODS AEROBIC BOTTLE ONLY CRITICAL VALUE NOTED.  VALUE IS CONSISTENT WITH PREVIOUSLY REPORTED AND CALLED VALUE.    Culture (A)  Final    ESCHERICHIA COLI SUSCEPTIBILITIES PERFORMED ON PREVIOUS CULTURE WITHIN THE LAST 5 DAYS. Performed at [facility], [address] [place], [place] [address]    Report Status 11/01/2024 FINAL  Final  Culture, blood (Routine x 2)     Status: Abnormal   Collection Time: 10/29/24  5:37 PM   Specimen: BLOOD RIGHT HAND  Result Value Ref Range Status   Specimen Description   Final    BLOOD RIGHT HAND Performed at [facility], [address] [place], [place] [address]    Special Requests   Final    BOTTLES DRAWN AEROBIC ONLY Blood Culture results may not be optimal due to an inadequate volume of blood received in culture bottles Performed at [facility], [address] [place], [place] [address]    Culture  Setup Time   Final    GRAM NEGATIVE RODS AEROBIC BOTTLE ONLY CRITICAL RESULT CALLED TO, READ BACK BY AND VERIFIED WITH: PHARMD J.LEGGE AT 0810 ON 10/30/2024 BY  T.SAAD. Performed at  [facility], [address], [place], [place] [address]    Culture ESCHERICHIA COLI (A)  Final   Report Status 11/01/2024 FINAL  Final   Organism ID, Bacteria ESCHERICHIA COLI  Final      Susceptibility   Escherichia coli - MIC*    AMPICILLIN 4 SENSITIVE Sensitive     CEFAZOLIN  (NON-URINE) 2 SENSITIVE Sensitive     CEFEPIME <=0.12 SENSITIVE Sensitive     ERTAPENEM <=0.12 SENSITIVE Sensitive     CEFTRIAXONE  <=0.25 SENSITIVE Sensitive     CIPROFLOXACIN <=0.06 SENSITIVE Sensitive     GENTAMICIN <=1 SENSITIVE Sensitive     MEROPENEM <=0.25 SENSITIVE Sensitive     TRIMETH /SULFA  <=20 SENSITIVE Sensitive     AMPICILLIN/SULBACTAM <=2 SENSITIVE Sensitive     PIP/TAZO Value in next row Sensitive      <=4 SENSITIVEThis is a modified FDA-approved test that has been validated and its performance characteristics determined by the reporting laboratory.  This laboratory is certified under the Clinical Laboratory Improvement Amendments CLIA as qualified to perform high complexity clinical laboratory testing.    * ESCHERICHIA COLI  Blood Culture ID Panel (Reflexed)     Status: Abnormal   Collection Time: 10/29/24  5:37 PM  Result Value Ref Range Status   Enterococcus faecalis NOT DETECTED NOT DETECTED Final   Enterococcus Faecium NOT DETECTED NOT DETECTED Final   Listeria monocytogenes NOT DETECTED NOT DETECTED Final   Staphylococcus species NOT DETECTED NOT DETECTED Final   Staphylococcus aureus (BCID) NOT DETECTED NOT DETECTED Final   Staphylococcus epidermidis NOT DETECTED NOT DETECTED Final   Staphylococcus lugdunensis NOT DETECTED NOT DETECTED Final   Streptococcus species NOT DETECTED NOT DETECTED Final   Streptococcus agalactiae NOT DETECTED NOT DETECTED Final   Streptococcus pneumoniae NOT DETECTED NOT DETECTED Final   Streptococcus pyogenes NOT DETECTED NOT DETECTED Final   A.calcoaceticus-baumannii NOT DETECTED NOT DETECTED Final   Bacteroides fragilis NOT  DETECTED NOT DETECTED Final   Enterobacterales DETECTED (A) NOT DETECTED Final    Comment: Enterobacterales represent a large order of gram negative bacteria, not a single organism. CRITICAL RESULT CALLED TO, READ BACK BY AND VERIFIED WITH: PHARMD J.LEGGE AT 0810 ON 10/30/2024 BY T.SAAD.    Enterobacter cloacae complex NOT DETECTED NOT DETECTED Final   Escherichia coli DETECTED (A) NOT DETECTED Final    Comment: CRITICAL RESULT CALLED TO, READ BACK BY AND VERIFIED WITH: PHARMD J.LEGGE AT 0810 ON 10/30/2024 BY T.SAAD.    Klebsiella aerogenes NOT DETECTED NOT DETECTED Final   Klebsiella oxytoca NOT DETECTED NOT DETECTED Final   Klebsiella pneumoniae NOT DETECTED NOT DETECTED Final   Proteus species NOT DETECTED NOT DETECTED Final   Salmonella species NOT DETECTED NOT DETECTED Final   Serratia marcescens NOT DETECTED NOT DETECTED Final   Haemophilus influenzae NOT DETECTED NOT DETECTED Final   Neisseria meningitidis NOT DETECTED NOT DETECTED Final   Pseudomonas aeruginosa NOT DETECTED NOT DETECTED Final   Stenotrophomonas maltophilia NOT DETECTED NOT DETECTED Final   Candida albicans NOT DETECTED NOT DETECTED Final   Candida auris NOT DETECTED NOT DETECTED Final   Candida glabrata NOT DETECTED NOT DETECTED Final   Candida krusei NOT DETECTED NOT DETECTED Final   Candida parapsilosis NOT DETECTED NOT DETECTED Final   Candida tropicalis NOT DETECTED NOT DETECTED Final   Cryptococcus neoformans/gattii NOT DETECTED NOT DETECTED Final   CTX-M ESBL NOT DETECTED NOT DETECTED Final   Carbapenem resistance IMP NOT DETECTED NOT DETECTED Final   Carbapenem resistance KPC NOT DETECTED  NOT DETECTED Final   Carbapenem resistance NDM NOT DETECTED NOT DETECTED Final   Carbapenem resist OXA 48 LIKE NOT DETECTED NOT DETECTED Final   Carbapenem resistance VIM NOT DETECTED NOT DETECTED Final    Comment: Performed at [facility], [address], [place], [place] [address]  MRSA Next Gen by PCR,  Nasal     Status: None   Collection Time: 11/02/24  8:54 AM   Specimen: Nasal Mucosa; Nasal Swab  Result Value Ref Range Status   MRSA by PCR Next Gen NOT DETECTED NOT DETECTED Final    Comment: (NOTE) The GeneXpert MRSA Assay (FDA approved for NASAL specimens only), is one component of a comprehensive MRSA colonization surveillance program. It is not intended to diagnose MRSA infection nor to guide or monitor treatment for MRSA infections. Test performance is not FDA approved in patients less than 86 years old. Performed at [facility], [address] [place], [place] [address]          Radiology Studies: No results found.      Scheduled Meds:  acetaminophen   650 mg Per Tube Q6H   Chlorhexidine  Gluconate Cloth  6 each Topical Daily   feeding supplement (PROSource TF20)  60 mL Per Tube Daily   furosemide   40 mg Per Tube Daily   insulin  aspart  0-15 Units Subcutaneous Q4H   levothyroxine   112 mcg Per Tube QAC breakfast   mouth rinse  15 mL Mouth Rinse 4 times per day   pantoprazole  (PROTONIX ) IV  40 mg Intravenous Q12H   sertraline   200 mg Per Tube Daily   spironolactone   12.5 mg Per Tube Daily   thiamine  (VITAMIN B1) injection  100 mg Intravenous Q24H   zinc  sulfate (50mg  elemental zinc )  220 mg Per Tube Daily   Continuous Infusions:  copper  chloride 2 mg in dextrose  5 % 100 mL IVPB Stopped (11/07/24 1340)   feeding supplement (VITAL AF 1.2 CAL)     heparin  1,750 Units/hr (11/08/24 9367)   magnesium  sulfate bolus IVPB       LOS: 10 days   Time spent:  Elsie JAYSON Montclair, DO [facility]  If 7PM-7AM, please contact night-coverage [email or web address]  11/08/2024, 8:43 AM

## 2024-11-08 NOTE — Progress Notes (Signed)
 Nutrition Follow-up  DOCUMENTATION CODES:   Severe malnutrition in context of chronic illness  INTERVENTION:   Tube Feeding via G-tube: Vital AF 1.2 at 60 ml/hr Pro-Source TF20 60 mL daily TF at goal rate provides 1808 kcals, 128 g of protein and 1166 mL of free water    Once tolerating TF at goal rate, recommend trial of standard TF formula   Once TF at goal, recommend considering Juven BID for wound healing   Monitor bowel frequency and consider addition of Banatrol to bulk stool; noted w/ recent hx of constipation - continue to monitor   Continue IV Thiamine  100 mg  Continue IV copper  2mg  daily x 6 days and Zinc  Sulfate 220 mg daily per tube for at least 14 days.  Plan to recheck values post supplementation with CRP   Serum Vitamin A  level returned low; assess for repletion once tolerating oral diet  Consider vitamin C lab draw and/or supplementation due to presence of other deficiencies noted that can impact wound healing    NUTRITION DIAGNOSIS:  Severe Malnutrition related to chronic illness as evidenced by moderate muscle depletion, severe muscle depletion, energy intake < or equal to 75% for > or equal to 1 month, edema, percent weight loss.   GOAL:  Patient will meet greater than or equal to 90% of their needs   MONITOR:  Vent status, Labs, Weight trends  REASON FOR ASSESSMENT:   Consult Enteral/tube feeding initiation and management, New TPN/TNA  ASSESSMENT:   58 y.o. female with PMH of Roux-en-Y gastric bypass (2018) who presented with abdominal pain, lactic acidosis and lipase greater than 2800. Admitted for SBO.  11/18 Admitted to [facility], OR: Ex Lap with LOA, G-tube placement in gastric remnant, wound VAC, vent postop 11/20 OR: Ex Lap with washout, wound VAC 11/22 OR: Washout with abdominal closure, wound VAC to open wound; Ischemic hand with occluded L radial artery s/p revascularization by VVS 11/23 TPN initiated, VHP at 10 ml/hr initiated 11/24 Extubated.  TPN continues at 30 ml/hr, VAF 1.2 at 20 11/25 TPN continue at 30 ml/hr, VAF 1.2 up to 30 ml/hr 11/27 TPN off 11/28 up to goal rate (49ml/hr) TF today; diet advanced to full liquids   Met with patient at bedside this morning. Up to goal TF rate as of this morning and tolerating well so far. Reports some loose stools. Will continue to monitor and add stool bulking agent, if indicated. Notably, she was recently with significant constipation (no BM x7 days).  Had consumed some bites of her clear liquid diet. Reports tolerating. Encouraged continued intake. SLP saw later today and has advanced patient to full liquid diet with instructions to advance as medically able thereafter.   Wound VAC in place. Given inability of patient's family to provide level of care required post discharge, CIR has declined. SNF work up pending.  Drains/Lines: R brachial: PICC, triple lumen LUQ: G-tube (20Fr) Mid abdomen: wound vac UOP: 1600 x24 hours   Micronutrient Labs (10/30/24): CRP: not checked Copper : 67 (L)-supplementing Folate: 15.8(wdl) Vitamin A : 9.9 (L) Vitamin B12: 2693 (H)-elevated in presence of inflammation Zinc : 15(L)-supplementing   Labs: Sodium 136 (wdl) Potassium 4.1 (wdl) Calcium  7.5 (L) BUN 14 Creatinine 0.37  Magnesium  1.7 (wdl) Hgb 8.6>7.9>7.6 (L) CBGs 120-129 x24 hours A1c 5.3 (10/2024)   Meds: Lasix  SS novolog  Levothyroxine  Pantoprazole  Spironolactone  Thiamine  IV copper  chloride 2mg  x 6 days - ends 11/30 Zinc  Sulfate 220 mg x 14 days - ends 12/08  Diet Order:   Diet Order  Diet full liquid Room service appropriate? Yes with Assist; Fluid consistency: Thin  Diet effective now                   EDUCATION NEEDS:   Not appropriate for education at this time  Skin:  Skin Assessment: Skin Integrity Issues: Skin Integrity Issues:: Other (Comment) DTI: R Heel, Coccyx Incisions: Large abdominal wound at surgical incision Other: necrotic L thumb  (seen by Ortho- amputation at some point)  Last BM:  11/27 - type 7 x2  Height:  Ht Readings from Last 1 Encounters:  10/29/24 5' 4 (1.626 m)   Weight:  Wt Readings from Last 1 Encounters:  11/08/24 71.8 kg   Ideal Body Weight:  54.55 kg  BMI:  Body mass index is 27.17 kg/m.  Estimated Nutritional Needs:   Kcal:  1700-1900 kcals  Protein:  110-130 g  Fluid:  >/= 1.8L  Blair Deaner MS, RD, LDN Registered Dietitian Clinical Nutrition RD Inpatient Contact Info in Amion

## 2024-11-08 NOTE — Progress Notes (Addendum)
  Inpatient Rehabilitation Admissions Coordinator   Met with patient at bedside  and spoke with her niece, Coralee, by phone for rehab assessment. We discussed goals and expectations of a possible CIR admit. Jessica's 57 yo son, Thedora lives with patient and works remotely from home. He would not be able to provide 24/7 assist. Harlene is a CNA  with 6 children and unable to provide 24/7 care, but would like to talk to patient and other family members before deciding rehab venue dispo. She will follow up with me with that preference. Please call me with any questions.   Heron Leavell, RN, MSN Rehab Admissions Coordinator [phone]    Received return call from [place], Harlene, niece. She has checked with family members and no one will be able to provide her 24/7 care after a CIR admit and her son, Thedora, who lives with patient ,cannot provide the care she will need. Harlene is recommending SNF. I will alert TOC and sign off.  Heron Leavell, RN, MSN Rehab Admissions Coordinator [phone] 11/08/2024 2:06 PM

## 2024-11-08 NOTE — Progress Notes (Addendum)
 Advanced Heart Failure Rounding Note  Patient Profile:    58 y/o female admitted w/ SBO and septic shock d/t e.coli bacteremia, s/p exploratory laparotomy w/ lysis of adhesions and placement of gastrostomy tube. Post op course c/b new systolic heart failure w/ CS and acute radial artery occlusion/ischemic left hand requiring radial artery embolectomy.   Subjective:    11/22 To OR after ab wound closure and revasc of LUE   Remains off DBA with co-ox 77%.   Reports discomfort at abdominal incision. No shortness of breath or chest pain. Able to take a few steps with PT.   Objective:     Vital Signs:   Temp:  [98 F (36.7 C)-99 F (37.2 C)] 98.6 F (37 C) (11/28 0758) Pulse Rate:  [73-89] 89 (11/28 0904) Resp:  [14-20] 20 (11/28 0758) BP: (81-148)/(57-99) 148/99 (11/28 0904) SpO2:  [93 %-100 %] 93 % (11/28 0904) Weight:  [71.8 kg] 71.8 kg (11/28 0451) Last BM Date : 11/07/24  Weight change: Filed Weights   11/04/24 0500 11/05/24 0700 11/08/24 0451  Weight: 71.6 kg 73 kg 71.8 kg    Intake/Output:   Intake/Output Summary (Last 24 hours) at 11/08/2024 0940 Last data filed at 11/08/2024 9367 Gross per 24 hour  Intake 1759.9 ml  Output 1600 ml  Net 159.9 ml     Physical Exam: General:  Fatigued appearing.  Cor: No JVD. Regular rate & rhythm. No murmurs. Lungs: clear Abdomen: soft, incisional tenderness, nondistended. Extremities: no edema  Telemetry: NSR 70s  Labs: Basic Metabolic Panel: Recent Labs  Lab 11/04/24 1729 11/05/24 0337 11/05/24 1055 11/05/24 1807 11/06/24 0436 11/06/24 1700 11/07/24 0440 11/08/24 0501  NA 135  --  133*  --  137  --  136 136  K 4.3  --  4.0  --  3.6  --  4.2 4.1  CL 90*  --  92*  --  97*  --  97* 98  CO2 35*  --  34*  --  33*  --  32 28  GLUCOSE 524*  --  414*  --  116*  --  120* 129*  BUN 27*  --  23*  --  17  --  18 14  CREATININE 0.60  --  0.56  --  0.46  --  0.47 0.37*  CALCIUM  7.1*  --  7.0*  --  7.3*  --  7.4*  7.5*  MG 2.3 2.0  --  1.8 2.0 1.6*  --  1.7  PHOS 2.8 2.2*  --  3.0 3.5 2.9  --   --     Liver Function Tests: Recent Labs  Lab 11/02/24 0355 11/03/24 0353 11/04/24 0500  AST 78*  78* 80* 88*  ALT 72*  72* 66* 64*  ALKPHOS 44  44 54 48  BILITOT 0.8  0.8 1.3* 0.8  PROT 3.8*  3.8* 4.2* 4.1*  ALBUMIN  <1.5*  <1.5* 1.6* <1.5*   No results for input(s): LIPASE, AMYLASE in the last 168 hours.  No results for input(s): AMMONIA in the last 168 hours.  CBC: Recent Labs  Lab 11/04/24 0500 11/05/24 0337 11/05/24 1055 11/06/24 0436 11/07/24 0440 11/08/24 0501  WBC 10.4 9.1  --  11.3* 11.3* 10.5  HGB 7.3* 6.9* 7.9* 8.6* 7.9* 7.6*  HCT 22.3* 21.6* 24.7* 26.8* 23.9* 23.0*  MCV 87.8 88.9  --  89.0 87.9 87.8  PLT 87* 135*  --  199 246 282    Cardiac Enzymes: No results for  input(s): CKTOTAL, CKMB, CKMBINDEX, TROPONINI in the last 168 hours.  BNP: BNP (last 3 results) No results for input(s): BNP in the last 8760 hours.  ProBNP (last 3 results) Recent Labs    11/01/24 0920  PROBNP >35,000.0*      Other results:  Imaging: No results found.    Medications:     Scheduled Medications:  acetaminophen   650 mg Per Tube Q6H   Chlorhexidine  Gluconate Cloth  6 each Topical Daily   feeding supplement (PROSource TF20)  60 mL Per Tube Daily   furosemide   40 mg Per Tube Daily   insulin  aspart  0-15 Units Subcutaneous Q4H   levothyroxine   112 mcg Per Tube QAC breakfast   mouth rinse  15 mL Mouth Rinse 4 times per day   pantoprazole  (PROTONIX ) IV  40 mg Intravenous Q12H   sertraline   200 mg Per Tube Daily   spironolactone   12.5 mg Per Tube Daily   thiamine  (VITAMIN B1) injection  100 mg Intravenous Q24H   zinc  sulfate (50mg  elemental zinc )  220 mg Per Tube Daily    Infusions:  copper  chloride 2 mg in dextrose  5 % 100 mL IVPB Stopped (11/07/24 1340)   feeding supplement (VITAL AF 1.2 CAL) 1,000 mL (11/08/24 0910)   heparin  1,750 Units/hr (11/08/24  9367)   magnesium  sulfate bolus IVPB 2 g (11/08/24 0852)    PRN Medications: HYDROmorphone  (DILAUDID ) injection, mouth rinse, oxyCODONE , sodium chloride  flush   Assessment:   1. Septic shock with e.coli bacteremia +/- component of cardiogenic shock New Cardiomyopathy -  - Patient was admitted with small bowel obstruction and septic shock. Patient underwent exploratory laparotomy exploratory laparotomy was found to have a densities at the end obstructing the proximal small bowel near Roux-en-Y anastomosis.   - Bcx + for e.coli - Completed abx and stress-dose steroids per CCM - ab closure on 11/22 - Echo showed LVEF of 20-25% with multiple wall motion abnormalities  - POCUS ECHO 11/22 EF ~30-35% with RWMA concerning for underlying CAD - Co-ox stable off DBA - Volume looks okay on exam. Continue lasix  40 mg daily. - Continue spiro 12.5 mg daily - BP too soft for further GDMT titration. Most BP 80s-90s, 1 reading 140s/90s but suspect this may be erroneous. BP checked on LLE - May need cath prior to d/c if EF not improving (pending improvement of thrombocytopenia and overall condition). Would do cath femorally  2. Acute systolic HF - plan as above.  - co-ox stable off support - Lasix  40 mg daily  - Continue Spiro 12.5 mg daily   3. Acute hypoxic resp failure - extubated  - stable on  RA  4. Non-Obstructive CAD - Coronary CTA in 09/2024 showed coronary calcium  score of 315 (90th percentile for age and sex) and mild mixed nonobstructive CAD with 1-24% stenosis of the proximal RCA and 25-49% stenosis of LAD. - Off aspirin  with severe thrombocytopenia this admit  - Intolerant to statins in the past.  - Echo concerning for new RWMA. Will need to consider cath via femoral approach prior to d/c   5. Ischemic Left Hand - VVS following. S/p revasc 11/22  - On heparin    6. Severe thrombocytopenia - in setting of sepsis, now improved    9. Anemia: -Hgb slowly drifting down after  transfusion, 8.6>7.6 -Trend, if continues to drift down will likely need another u RBCs  Length of Stay: 10   FINCH, LINDSAY N PA-C  11/08/2024, 9:40 AM  Advanced Heart  Failure Team Pager [phone] (M-F; 7a - 4p)  Please contact [facility] for night-coverage after hours (4p -7a ) and weekends on [email or web address]    Patient seen and examined with the above-signed Advanced Practice Provider and/or Housestaff. I personally reviewed laboratory data, imaging studies and relevant notes. I independently examined the patient and formulated the important aspects of the plan. I have edited the note to reflect any of my changes or salient points. I have personally discussed the plan with the patient and/or family.  Feeling much better.  Off TPN and now on TFs.   General:  Sitting up in bed. No resp difficulty HEENT: normal Neck: supple. no JVD.  Cor: Regular rate & rhythm. No rubs, gallops or murmurs. Lungs: clear Abdomen: soft, + dressing Extremities: no cyanosis, clubbing, rash, edema  left thumb wrapped Neuro: alert & orientedx3, cranial nerves grossly intact. moves all 4 extremities w/o difficulty. Affect pleasant  Looks great from cardiac perspective. Volume ok on lasix . Tolerating spiro. Add low-dose losartan as tolerated  Consider coronary angio (femoral approach) next week.  We will see again Monday. Please call with questions.   Toribio Fuel, MD  11:19 AM

## 2024-11-09 DIAGNOSIS — K56609 Unspecified intestinal obstruction, unspecified as to partial versus complete obstruction: Secondary | ICD-10-CM | POA: Diagnosis not present

## 2024-11-09 LAB — CBC
HCT: 22.2 % — ABNORMAL LOW (ref 36.0–46.0)
Hemoglobin: 7.2 g/dL — ABNORMAL LOW (ref 12.0–15.0)
MCH: 28.7 pg (ref 26.0–34.0)
MCHC: 32.4 g/dL (ref 30.0–36.0)
MCV: 88.4 fL (ref 80.0–100.0)
Platelets: 323 K/uL (ref 150–400)
RBC: 2.51 MIL/uL — ABNORMAL LOW (ref 3.87–5.11)
RDW: 14.6 % (ref 11.5–15.5)
WBC: 11.2 K/uL — ABNORMAL HIGH (ref 4.0–10.5)
nRBC: 0 % (ref 0.0–0.2)

## 2024-11-09 LAB — BASIC METABOLIC PANEL WITH GFR
Anion gap: 7 (ref 5–15)
BUN: 14 mg/dL (ref 6–20)
CO2: 30 mmol/L (ref 22–32)
Calcium: 7.4 mg/dL — ABNORMAL LOW (ref 8.9–10.3)
Chloride: 95 mmol/L — ABNORMAL LOW (ref 98–111)
Creatinine, Ser: 0.43 mg/dL — ABNORMAL LOW (ref 0.44–1.00)
GFR, Estimated: >60 mL/min (ref >60)
Glucose, Bld: 126 mg/dL — ABNORMAL HIGH (ref 70–99)
Potassium: 4.2 mmol/L (ref 3.5–5.1)
Sodium: 132 mmol/L — ABNORMAL LOW (ref 135–145)

## 2024-11-09 LAB — GLUCOSE, CAPILLARY
Glucose-Capillary: 127 mg/dL — ABNORMAL HIGH (ref 70–99)
Glucose-Capillary: 136 mg/dL — ABNORMAL HIGH (ref 70–99)
Glucose-Capillary: 84 mg/dL (ref 70–99)
Glucose-Capillary: 128 mg/dL — ABNORMAL HIGH (ref 70–99)
Glucose-Capillary: 116 mg/dL — ABNORMAL HIGH (ref 70–99)
Glucose-Capillary: 114 mg/dL — ABNORMAL HIGH (ref 70–99)

## 2024-11-09 LAB — MAGNESIUM: Magnesium: 2.1 mg/dL (ref 1.7–2.4)

## 2024-11-09 LAB — HEPARIN LEVEL (UNFRACTIONATED): Heparin Unfractionated: 0.47 [IU]/mL (ref 0.30–0.70)

## 2024-11-09 MED ORDER — OXYCODONE HCL 5 MG PO TABS
5.0000 mg | ORAL_TABLET | Freq: Four times a day (QID) | ORAL | Status: DC | PRN
  Administered 2024-11-09: 5 mg
  Administered 2024-11-09 – 2024-11-11 (×6): 10 mg
  Filled 2024-11-09 (×8): qty 2

## 2024-11-09 MED ORDER — HYDROMORPHONE HCL 1 MG/ML IJ SOLN
0.5000 mg | INTRAMUSCULAR | Status: DC | PRN
  Administered 2024-11-09 – 2024-11-11 (×7): 0.5 mg via INTRAVENOUS
  Filled 2024-11-09 (×7): qty 0.5

## 2024-11-09 NOTE — Plan of Care (Signed)

## 2024-11-09 NOTE — Treatment Plan (Signed)
 Occupational Therapy Treatment Patient Details Name: Morgan [name] MRN: [id number] DOB: 10-31-1966 Today's Date: 11/09/2024   History of present illness 58 yo F adm 10/29/24 with SBO s/p ex lap with g-tube placement same date. 11/20 and 11/22 repeat ex lap with VAC. VDRF 11/18-11/24. Pressor support with thrombosed radial artery s/p Lt radial artery embolectomy11/22. PMH: SBO, gastric bypass, DM, OSA, HLD, hypotension, bradycardia, Lt TSA   OT comments  Pt completed bed level L hand HEP program with HOB increased to more chair like position HOB >45 degrees. Pt tolerating well. Pt recognized brother and greeting him appropriately. Pt continues to require max cues and hand over hand to complete hand exercises. Pt currently with black thumb tip with dressing covering the thumb so unable to visualize the remainder of the skin. Recommendations for acute Ot to follow remains appropriate.       If plan is discharge home, recommend the following:  Two people to help with walking and/or transfers;A lot of help with bathing/dressing/bathroom;Assistance with cooking/housework;Direct supervision/assist for medications management;Direct supervision/assist for financial management;Assist for transportation;Help with stairs or ramp for entrance   Equipment Recommendations  Other (comment)    Recommendations for Other Services PT consult;Rehab consult    Precautions / Restrictions Precautions Precautions: Fall;Other (comment) Recall of Precautions/Restrictions: Impaired Precaution/Restrictions Comments: Abd VAC, g-tube, foley, PICC       Mobility Bed Mobility                    Transfers                         Balance                                           ADL either performed or assessed with clinical judgement   ADL                                              Extremity/Trunk Assessment Upper Extremity Assessment Upper  Extremity Assessment: LUE deficits/detail LUE Deficits / Details: thumb wrapped with DIP color black in appears. pt with AROM for thumb abduction with decreased adduction due to wrap. pt with mcp flexion to all digits with decreased pip flexion in 3rd and 4th digits. LUE Coordination: decreased fine motor;decreased gross Systems Analyst Communication Communication: No apparent difficulties   Cognition Arousal: Alert Behavior During Therapy: Flat affect Cognition: Cognition impaired     Awareness: Online awareness impaired Memory impairment (select all impairments): Working Biochemist, Clinical functioning impairment (select all impairments): Sequencing, Reasoning OT - Cognition Comments: pt needs tactile input to attempt hand movements with poor return demo at times. pt with visual attention and over hand better return demo                 Following commands: Impaired Following commands impaired: Follows one step commands with increased time      Cueing   Cueing Techniques: Verbal cues, Tactile cues, Gestural cues  Exercises Hand Exercises Digit Composite Flexion: AROM, 10 reps, Supine, Left Composite Extension: AROM, AAROM,  Left, 10 reps, Supine Digit Composite Abduction: AROM, Left, 10 reps, Supine Digit Composite Adduction: AROM, AAROM, PROM, Left, 15 reps Digit Lifts: AROM, Left, 10 reps Thumb Abduction: AROM, Left, 10 reps Thumb Adduction: PROM, AAROM, Left, 15 reps Opposition: PROM, Left, 10 reps Other Exercises Other Exercises: pt with bil hands using red and green foams with flexion. pt with no recall of prior session instructions. pt with handout in room for HEP with max (A) required. medbridge access code [email or web address] Other Exercises: completed tendon gliding exercises on left x10 reps x2 sets    Shoulder Instructions       General Comments      Pertinent Vitals/ Pain       Pain Assessment Pain  Assessment: Faces Faces Pain Scale: Hurts a little bit Pain Location: 2nd digit MCP with hand exercises Pain Descriptors / Indicators: Sore Pain Intervention(s): Monitored during session, Repositioned  Home Living                                          Prior Functioning/Environment              Frequency  Min 3X/week        Progress Toward Goals  OT Goals(current goals can now be found in the care plan section)  Progress towards OT goals: Progressing toward goals  Acute Rehab OT Goals Patient Stated Goal: improve L hand OT Goal Formulation: With patient Time For Goal Achievement: 11/18/24 Potential to Achieve Goals: Good ADL Goals Pt Will Perform Upper Body Dressing: with supervision;sitting Pt Will Perform Lower Body Dressing: with min assist;sitting/lateral leans;sit to/from stand Pt Will Transfer to Toilet: with min assist;stand pivot transfer;bedside commode Pt/caregiver will Perform Home Exercise Program: Increased ROM;Increased strength;Both right and left upper extremity;With minimal assist;With written HEP provided Additional ADL Goal #1: Pt will perform bed mobility min A in prep for seated ADLs  Plan      Co-evaluation                 AM-PAC OT 6 Clicks Daily Activity     Outcome Measure   Help from another person eating meals?: A Lot Help from another person taking care of personal grooming?: A Lot Help from another person toileting, which includes using toliet, bedpan, or urinal?: Total Help from another person bathing (including washing, rinsing, drying)?: A Lot Help from another person to put on and taking off regular upper body clothing?: A Lot Help from another person to put on and taking off regular lower body clothing?: Total 6 Click Score: 10    End of Session    OT Visit Diagnosis: Unsteadiness on feet (R26.81);Other abnormalities of gait and mobility (R26.89);Muscle weakness (generalized) (M62.81)   Activity  Tolerance Patient tolerated treatment well   Patient Left in bed;with call bell/phone within reach;with bed alarm set   Nurse Communication Other (comment) (positioning L hand - keep elevated ; encourage use of squeeze foam)        Time: 8971-8954 OT Time Calculation (min): 17 min  Charges: OT General Charges $OT Visit: 1 Visit OT Treatments $Self Care/Home Management : 8-22 mins   Brynn, OTR/L  Acute Rehabilitation Services Office: [phone] .   Ely Molt 11/09/2024, 11:03 AM

## 2024-11-09 NOTE — Progress Notes (Signed)
 PHARMACY - ANTICOAGULATION CONSULT NOTE  Pharmacy Consult for heparin  infusion Indication: acute radial artery occlusion/ischemic L hand  Patient Measurements: Height: 5' 4 (162.6 cm) Weight: 71.8 kg (158 lb 4.6 oz) IBW/kg (Calculated) : 54.7 HEPARIN  DW (KG): 59.9  Vital Signs: Temp: 98.3 F (36.8 C) (11/29 0353) Temp Source: Oral (11/29 0353) BP: 130/61 (11/29 0353) Pulse Rate: 73 (11/29 0353)  Labs: Recent Labs    11/07/24 0440 11/07/24 1350 11/07/24 2059 11/08/24 0501 11/08/24 0502 11/09/24 0424  HGB 7.9*  --   --  7.6*  --  7.2*  HCT 23.9*  --   --  23.0*  --  22.2*  PLT 246  --   --  282  --  323  HEPARINUNFRC 0.22*   < > 0.31  --  0.55 0.47  CREATININE 0.47  --   --  0.37*  --  0.43*   < > = values in this interval not displayed.    Estimated Creatinine Clearance: 75.3 mL/min (A) (by C-G formula based on SCr of 0.43 mg/dL (L)).   Assessment: 58 yo F presenting with E. Coli bacteremia 2/2 SBO with perforation. On 11/22 underwent closure of abdomen in OR and repair of left radial artery. Not on AC PTA. Pharmacy consulted for heparin  management.  Received 3 units of platelets 11/22.  Heparin  level is therapeutic at 0.47, on 1750 units/hr. Hgb 7.2, plt 323. No s/sx of bleeding or infusion issues.   Goal of Therapy:  Heparin  level 0.3-0.5 units/ml Monitor platelets by anticoagulation protocol: Yes   Plan:  Continue IV heparin  at 1750 units/hr Monitor daily heparin  level, CBC, and for s/sx of bleeding   Thank you for allowing pharmacy to participate in this patient's care,  Suzen Sour, PharmD, BCCCP Clinical Pharmacist  Phone: [phone] 11/09/2024 11:33 AM  Please check AMION for all [facility] Pharmacy phone numbers After 10:00 PM, call Main Pharmacy [phone]

## 2024-11-09 NOTE — Progress Notes (Signed)
 Progress Note  7 Days Post-Op  Subjective: No complaints this morning, feeling pretty well.  She started some clear liquids and tolerating this without any issue.  Objective: Vital signs in last 24 hours: Temp:  [98.3 F (36.8 C)-98.9 F (37.2 C)] 98.3 F (36.8 C) (11/29 0353) Pulse Rate:  [73-89] 73 (11/29 0353) Resp:  [15-20] 20 (11/29 0353) BP: (114-148)/(53-99) 130/61 (11/29 0353) SpO2:  [93 %-97 %] 97 % (11/29 0353) Last BM Date : 11/08/24  Intake/Output from previous day: 11/28 0701 - 11/29 0700 In: 1821.4 [P.O.:220; I.V.:373.4; NG/GT:1228] Out: 400 [Urine:400] Intake/Output this shift: No intake/output data recorded.  PE: General: NAD HEENT: sclera anicteric Heart: regular, rate, and rhythm.   Lungs: Respiratory effort nonlabored Abd: soft, nondistended, appropriately ttp, G-tube present with sutures in place, midline wound with vac in place good seal; changed 11/28 see photo and WOC note   Lab Results:  Recent Labs    11/08/24 0501 11/09/24 0424  WBC 10.5 11.2*  HGB 7.6* 7.2*  HCT 23.0* 22.2*  PLT 282 323   BMET Recent Labs    11/08/24 0501 11/09/24 0424  NA 136 132*  K 4.1 4.2  CL 98 95*  CO2 28 30  GLUCOSE 129* 126*  BUN 14 14  CREATININE 0.37* 0.43*  CALCIUM  7.5* 7.4*   PT/INR No results for input(s): LABPROT, INR in the last 72 hours. CMP     Component Value Date/Time   NA 132 (L) 11/09/2024 0424   NA 142 09/23/2024 1030   K 4.2 11/09/2024 0424   CL 95 (L) 11/09/2024 0424   CO2 30 11/09/2024 0424   GLUCOSE 126 (H) 11/09/2024 0424   BUN 14 11/09/2024 0424   BUN 17 09/23/2024 1030   CREATININE 0.43 (L) 11/09/2024 0424   CREATININE 0.82 06/18/2013 1009   CALCIUM  7.4 (L) 11/09/2024 0424   PROT 4.1 (L) 11/04/2024 0500   PROT 6.5 06/28/2024 0842   ALBUMIN  <1.5 (L) 11/04/2024 0500   ALBUMIN  4.5 06/28/2024 0842   AST 88 (H) 11/04/2024 0500   ALT 64 (H) 11/04/2024 0500   ALKPHOS 48 11/04/2024 0500   BILITOT 0.8 11/04/2024 0500    BILITOT 0.3 06/28/2024 0842   GFRNONAA >60 11/09/2024 0424   GFRNONAA 87 06/18/2013 1009   GFRAA >60 08/04/2020 1038   GFRAA >89 06/18/2013 1009   Lipase     Component Value Date/Time   LIPASE 22 11/01/2024 0450       Studies/Results: No results found.  Anti-infectives: Anti-infectives (From admission, onward)    Start     Dose/Rate Route Frequency Ordered Stop   10/31/24 1400  piperacillin -tazobactam (ZOSYN ) IVPB 3.375 g        3.375 g 12.5 mL/hr over 240 Minutes Intravenous Every 8 hours 10/31/24 0751 11/08/24 0029   10/31/24 1100  micafungin  (MYCAMINE ) 100 mg in sodium chloride  0.9 % 100 mL IVPB        100 mg 105 mL/hr over 1 Hours Intravenous Every 24 hours 10/31/24 0901 11/04/24 1354   10/30/24 0600  piperacillin -tazobactam (ZOSYN ) IVPB 3.375 g  Status:  Discontinued        3.375 g 12.5 mL/hr over 240 Minutes Intravenous Every 8 hours 10/29/24 2350 10/31/24 0751   10/29/24 2145  piperacillin -tazobactam (ZOSYN ) IVPB 3.375 g        3.375 g 100 mL/hr over 30 Minutes Intravenous  Once 10/29/24 2137 10/29/24 2205   10/29/24 1700  cefTRIAXone  (ROCEPHIN ) 1 g in sodium chloride  0.9 % 100  mL IVPB  Status:  Discontinued        1 g 200 mL/hr over 30 Minutes Intravenous  Once 10/29/24 1646 10/29/24 1656   10/29/24 1700  metroNIDAZOLE  (FLAGYL ) IVPB 500 mg        500 mg 100 mL/hr over 60 Minutes Intravenous  Once 10/29/24 1646 10/29/24 1809   10/29/24 1700  cefTRIAXone  (ROCEPHIN ) 2 g in sodium chloride  0.9 % 100 mL IVPB        2 g 200 mL/hr over 30 Minutes Intravenous  Once 10/29/24 1656 10/29/24 1733        Assessment/Plan POD 11, s/p ex lap with LOA, g-tube placement in gastric remnant Dr. Vernetta 11/18 secondary to SBO with possible perforation POD 9, s/p ex lap with washout, Dr. Tanda 11/20 POD 7, s/p washout, abdominal closure Dr. Tanda 11/22 - continue VAC T/F - Continue advancing diet as tolerated and as approved by SLP, currently cleared for full liquids     FEN - IVFs per CCM, TF to 32mL/hr (goal). Prosource/supplements per RD recs.  FLD per SLP.  VTE - heparin  gtt ID - micafungin  5 doses completed; Zosyn  11/20>> 11/28   - per CCM -  E coli bacteremia -completed 1 week of Zosyn  Septic shock - off pressors, improving  CAD/Heart failure - EF 20-25% on echo 11/21 AKI - resolved DM - SSI Leukopenia - resolved, secondary to sepsis Thrombocytopenia - resolved Hypocalcemia - per CCM Neck wound - unclear etiology, have asked nursing to place mepilex over this to protect it.  Avoid dressings or lines in this area. L hand ischemic changes -  VVS following, s/p embolectomy radial artery and repair, likely will need more surgery. Hand surgery consulted   DISPO- CIR declined, family working towards SNF.    LOS: 11 days   Morgan DELENA Freund, MD  [facility] 11/09/2024, 9:02 AM Please see Amion for pager number during day hours 7:00am-4:30pm

## 2024-11-09 NOTE — Progress Notes (Signed)
 PROGRESS NOTE    Morgan [name]  FMW:[id number] DOB: 1966/06/05 DOA: 10/29/2024 PCP: [name], DO   Brief Narrative:  Morgan [name] is a 58 year old female with history of Roux-en-Y gastric bypass who presented to the emergency department with abdominal pain, lactic acidosis and lipase greater than 2800.  She had CCS evaluation and went for exploratory laparotomy.  Found to have dense adhesive band that was obstructing her proximal small bowel as well as biliary leak without evidence of perforation.  She had a gastrostomy tube placed with a wound VAC.  Initially on Levophed , vasopressin , intubated on bicarb drip postoperatively.  Patient's septic shock continue to improve, off pressors, NG tube remains in place, postoperative care per general surgery including diet advancement.  Patient to complete antibiotics on 11/27, completed antifungals 11/25. Left thumb ischemia status post thrombectomy appears to be stable, vascular surgery following. Patient's chronic comorbid conditions otherwise appear to be improving if not stable.  Assessment & Plan:   Principal Problem:   SBO (small bowel obstruction) s/p lap LOA 03/19/2019 Active Problems:   Malnutrition of moderate degree   Protein-calorie malnutrition, severe  Septic shock due to perforated SBO with peritonitis, E Coli bacteremia, resolved SBO due to adhesion Status post ex lap with lysis of adhesions 11/18; washouts on 11/20 and abdominal closure on 11/22 -wound VAC replacement/exchange 11/28 G-tube in gastric remnant History of Roux-en-Y bariatric surgery - TPN off as of 11/27, tube feeds ongoing with advancing diet, advanced per general surgery - zosyn  completed for ecoli bactermia; micafungin  completed 11/25 for peritonitis    Acute HFrEF- concerning given CAD hx vs septic cardiomyopathy, improving - Off dobutamine , avoid pressors given above - GDMT on hold due to hypotension per heart failure team -start low dose Lasix  -  Cardiac cath pending further recovery   Critical left hand ischemia due to thrombosed radial artery, s/p thrombectomy - Left radial arterial line discontinued 11/20, status post radial artery embolectomy with vascular surgery who notes left thumb necrosis distal to MP joint, defer to hand surgery will allow this to declare then plan for amputation. - Continue heparin  drip   Urinary retention, improving Acute kidney injury, resolved - Foley previously removed with noted difficulty voiding over the past 24h - plan to replace foley catheter later today if still unable to void/requiring repeated in/out cath - Strict I/O -renally dose meds, avoid nephrotoxic meds  Thrombocytopenia due to sepsis, resolved - no need for platelet transfusion; monitor   Acute anemia, recovering - Presumed blood loss vs worsening chronic anemia of chronic disease/aplastic anemia - Hemoglobin improved to 8.6 after transfusion, 1 unit 11/25   Acute hypoxic respiratory failure postoperatively RLL PNA- suspect aspiration from SBO - extubated successfully 11/24 - wean O2 for sat >92%  - IS and mobilize as able   Hypoglycemia  H/o DM2 but  A1c 5.3 and not on meds PTA-- resolved after Roux-en-Y? -monitor; not needing insulin  - watch with stopping steroids   Hypothyroidism - con't synthroid    Elevated transaminases, hyperbilirubinemia - stabilizing  - bilirubin back to normal    Depression/Anxiety  - Increase sertraline  back to home dose  Pressure injury noted, POA Wound 10/31/24 1716 Pressure Injury Coccyx Medial Deep Tissue Pressure Injury - Purple or maroon localized area of discolored intact skin or blood-filled blister due to damage of underlying soft tissue from pressure and/or shear. (Active)   DVT prophylaxis: SCD's Start: 10/29/24 2346 continues on heparin  drip Code Status:   Code Status: Full Code Family  Communication: None present  Status is: Inpatient  Dispo: The patient is from: Home               Anticipated d/c is to: To be determined              Anticipated d/c date is: To be determined              Patient currently not medically stable for discharge  Consultants:  PCCM, general surgery, vascular surgery  Antimicrobials:  Micafungin  completed Zosyn  completed  Subjective:   Objective: Vitals:   11/08/24 1640 11/08/24 1924 11/08/24 2325 11/09/24 0353  BP: (!) 136/53 (!) 120/53 114/63 130/61  Pulse: 85 82 78 73  Resp: 20 17 15 20   Temp: 98.6 F (37 C) 98.9 F (37.2 C) 98.4 F (36.9 C) 98.3 F (36.8 C)  TempSrc: Oral Oral Oral Oral  SpO2: 93% 95% 96% 97%  Weight:      Height:        Intake/Output Summary (Last 24 hours) at 11/09/2024 0658 Last data filed at 11/09/2024 0353 Gross per 24 hour  Intake 1821.42 ml  Output 400 ml  Net 1421.42 ml   Filed Weights   11/04/24 0500 11/05/24 0700 11/08/24 0451  Weight: 71.6 kg 73 kg 71.8 kg    Examination:  General:  Pleasantly resting in bed, No acute distress. HEENT:  Normocephalic atraumatic.  Sclerae nonicteric, noninjected.  Extraocular movements intact bilaterally. Neck:  Without mass or deformity.  Trachea is midline. Lungs: Diminished without wheeze rales or rhonchi. Heart:  Regular rate and rhythm.  Without murmurs, rubs, or gallops. Abdomen: Nontender, G-tube noted, postoperative bandage clean dry intact Extremities: Without cyanosis, clubbing, edema, or obvious deformity.  Right upper extremity PICC line noted Skin:  Warm and dry, no erythema. Wound 10/31/24 1716 Pressure Injury Coccyx Medial Deep Tissue Pressure Injury - Purple or maroon localized area of discolored intact skin or blood-filled blister due to damage of underlying soft tissue from pressure and/or shear. (Active)    Data Reviewed: I have personally reviewed following labs and imaging studies  CBC: Recent Labs  Lab 11/05/24 0337 11/05/24 1055 11/06/24 0436 11/07/24 0440 11/08/24 0501 11/09/24 0424  WBC 9.1  --  11.3* 11.3*  10.5 11.2*  HGB 6.9* 7.9* 8.6* 7.9* 7.6* 7.2*  HCT 21.6* 24.7* 26.8* 23.9* 23.0* 22.2*  MCV 88.9  --  89.0 87.9 87.8 88.4  PLT 135*  --  199 246 282 323   Basic Metabolic Panel: Recent Labs  Lab 11/04/24 1729 11/05/24 0337 11/05/24 1055 11/05/24 1807 11/06/24 0436 11/06/24 1700 11/07/24 0440 11/08/24 0501 11/09/24 0424  NA 135  --  133*  --  137  --  136 136 132*  K 4.3  --  4.0  --  3.6  --  4.2 4.1 4.2  CL 90*  --  92*  --  97*  --  97* 98 95*  CO2 35*  --  34*  --  33*  --  32 28 30  GLUCOSE 524*  --  414*  --  116*  --  120* 129* 126*  BUN 27*  --  23*  --  17  --  18 14 14   CREATININE 0.60  --  0.56  --  0.46  --  0.47 0.37* 0.43*  CALCIUM  7.1*  --  7.0*  --  7.3*  --  7.4* 7.5* 7.4*  MG 2.3 2.0  --  1.8 2.0 1.6*  --  1.7  2.1  PHOS 2.8 2.2*  --  3.0 3.5 2.9  --   --   --    GFR: Estimated Creatinine Clearance: 75.3 mL/min (A) (by C-G formula based on SCr of 0.43 mg/dL (L)). Liver Function Tests: Recent Labs  Lab 11/03/24 0353 11/04/24 0500  AST 80* 88*  ALT 66* 64*  ALKPHOS 54 48  BILITOT 1.3* 0.8  PROT 4.2* 4.1*  ALBUMIN  1.6* <1.5*   No results for input(s): LIPASE, AMYLASE in the last 168 hours.  No results for input(s): AMMONIA in the last 168 hours. Coagulation Profile: Recent Labs  Lab 11/02/24 0853  INR 0.9   Cardiac Enzymes: No results for input(s): CKTOTAL, CKMB, CKMBINDEX, TROPONINI in the last 168 hours. BNP (last 3 results) Recent Labs    11/01/24 0920  PROBNP >35,000.0*   HbA1C: No results for input(s): HGBA1C in the last 72 hours. CBG: Recent Labs  Lab 11/08/24 1128 11/08/24 1638 11/08/24 1926 11/08/24 2321 11/09/24 0349  GLUCAP 96 115* 121* 104* 127*   Lipid Profile: No results for input(s): CHOL, HDL, LDLCALC, TRIG, CHOLHDL, LDLDIRECT in the last 72 hours.  Thyroid  Function Tests: No results for input(s): TSH, T4TOTAL, FREET4, T3FREE, THYROIDAB in the last 72 hours. Anemia Panel: No  results for input(s): VITAMINB12, FOLATE, FERRITIN, TIBC, IRON , RETICCTPCT in the last 72 hours. Sepsis Labs: No results for input(s): PROCALCITON, LATICACIDVEN in the last 168 hours.   Recent Results (from the past 240 hours)  MRSA Next Gen by PCR, Nasal     Status: None   Collection Time: 11/02/24  8:54 AM   Specimen: Nasal Mucosa; Nasal Swab  Result Value Ref Range Status   MRSA by PCR Next Gen NOT DETECTED NOT DETECTED Final    Comment: (NOTE) The GeneXpert MRSA Assay (FDA approved for NASAL specimens only), is one component of a comprehensive MRSA colonization surveillance program. It is not intended to diagnose MRSA infection nor to guide or monitor treatment for MRSA infections. Test performance is not FDA approved in patients less than 34 years old. Performed at [facility], [address], [place], [place] [address]          Radiology Studies: No results found.      Scheduled Meds:  acetaminophen   650 mg Per Tube Q6H   Chlorhexidine  Gluconate Cloth  6 each Topical Daily   feeding supplement (PROSource TF20)  60 mL Per Tube Daily   furosemide   40 mg Per Tube Daily   insulin  aspart  0-15 Units Subcutaneous Q4H   levothyroxine   112 mcg Per Tube QAC breakfast   mouth rinse  15 mL Mouth Rinse 4 times per day   pantoprazole  (PROTONIX ) IV  40 mg Intravenous Q12H   sertraline   200 mg Per Tube Daily   spironolactone   12.5 mg Per Tube Daily   thiamine  (VITAMIN B1) injection  100 mg Intravenous Q24H   zinc  sulfate (50mg  elemental zinc )  220 mg Per Tube Daily   Continuous Infusions:  copper  chloride 2 mg in dextrose  5 % 100 mL IVPB 2 mg (11/08/24 1046)   feeding supplement (VITAL AF 1.2 CAL) 1,000 mL (11/09/24 0430)   heparin  1,750 Units/hr (11/09/24 0353)     LOS: 11 days   Time spent:  Elsie JAYSON Montclair, DO [facility]  If 7PM-7AM, please contact night-coverage [email or web address]  11/09/2024, 6:58 AM

## 2024-11-10 DIAGNOSIS — K56609 Unspecified intestinal obstruction, unspecified as to partial versus complete obstruction: Secondary | ICD-10-CM | POA: Diagnosis not present

## 2024-11-10 LAB — BASIC METABOLIC PANEL WITH GFR
Anion gap: 6 (ref 5–15)
BUN: 15 mg/dL (ref 6–20)
CO2: 30 mmol/L (ref 22–32)
Calcium: 7.8 mg/dL — ABNORMAL LOW (ref 8.9–10.3)
Chloride: 97 mmol/L — ABNORMAL LOW (ref 98–111)
Creatinine, Ser: 0.39 mg/dL — ABNORMAL LOW (ref 0.44–1.00)
GFR, Estimated: >60 mL/min (ref >60)
Glucose, Bld: 143 mg/dL — ABNORMAL HIGH (ref 70–99)
Potassium: 4.5 mmol/L (ref 3.5–5.1)
Sodium: 133 mmol/L — ABNORMAL LOW (ref 135–145)

## 2024-11-10 LAB — GLUCOSE, CAPILLARY
Glucose-Capillary: 121 mg/dL — ABNORMAL HIGH (ref 70–99)
Glucose-Capillary: 123 mg/dL — ABNORMAL HIGH (ref 70–99)
Glucose-Capillary: 92 mg/dL (ref 70–99)
Glucose-Capillary: 133 mg/dL — ABNORMAL HIGH (ref 70–99)
Glucose-Capillary: 92 mg/dL (ref 70–99)
Glucose-Capillary: 147 mg/dL — ABNORMAL HIGH (ref 70–99)

## 2024-11-10 LAB — HEPARIN LEVEL (UNFRACTIONATED): Heparin Unfractionated: 0.32 [IU]/mL (ref 0.30–0.70)

## 2024-11-10 MED ORDER — VITAMIN A 3 MG (10000 UNIT) PO CAPS
10000.0000 [IU] | ORAL_CAPSULE | Freq: Every day | ORAL | Status: DC
  Administered 2024-11-11 – 2024-11-18 (×8): 10000 [IU] via ORAL
  Filled 2024-11-10 (×8): qty 1

## 2024-11-10 MED ORDER — OSMOLITE 1.5 CAL PO LIQD
1200.0000 mL | ORAL | Status: AC
  Administered 2024-11-10: 1000 mL

## 2024-11-10 MED ORDER — VITAMIN C 500 MG PO TABS
500.0000 mg | ORAL_TABLET | Freq: Two times a day (BID) | ORAL | Status: DC
  Administered 2024-11-10 – 2024-11-18 (×16): 500 mg via ORAL
  Filled 2024-11-10 (×16): qty 1

## 2024-11-10 MED ORDER — POLYETHYLENE GLYCOL 3350 17 G PO PACK
17.0000 g | PACK | Freq: Two times a day (BID) | ORAL | Status: DC
  Administered 2024-11-10 – 2024-11-13 (×8): 17 g via ORAL
  Filled 2024-11-10 (×9): qty 1

## 2024-11-10 MED ORDER — PROSOURCE TF20 ENFIT COMPATIBL EN LIQD
60.0000 mL | Freq: Two times a day (BID) | ENTERAL | Status: DC
  Administered 2024-11-10 – 2024-11-18 (×15): 60 mL
  Filled 2024-11-10 (×18): qty 60

## 2024-11-10 MED ORDER — ADULT MULTIVITAMIN W/MINERALS CH
1.0000 | ORAL_TABLET | Freq: Every day | ORAL | Status: DC
  Administered 2024-11-11 – 2024-11-18 (×8): 1 via ORAL
  Filled 2024-11-10 (×8): qty 1

## 2024-11-10 MED ORDER — ZINC SULFATE 220 (50 ZN) MG PO CAPS
220.0000 mg | ORAL_CAPSULE | Freq: Every day | ORAL | Status: DC
  Administered 2024-11-11 – 2024-11-14 (×4): 220 mg
  Filled 2024-11-10 (×4): qty 1

## 2024-11-10 MED ORDER — COPPER 2 MG PO TABS
2.0000 mg | Freq: Every day | Status: DC
  Administered 2024-11-11 – 2024-11-13 (×3): 2 mg via ORAL
  Filled 2024-11-10 (×3): qty 1

## 2024-11-10 MED ORDER — JUVEN PO PACK
1.0000 | PACK | Freq: Two times a day (BID) | ORAL | Status: DC
  Administered 2024-11-10 – 2024-11-11 (×2): 1
  Filled 2024-11-10 (×2): qty 1

## 2024-11-10 MED ORDER — OSMOLITE 1.5 CAL PO LIQD
1440.0000 mL | ORAL | Status: DC
  Administered 2024-11-11 – 2024-11-12 (×2): 1440 mL

## 2024-11-10 MED ORDER — FREE WATER
30.0000 mL | Status: DC
  Administered 2024-11-10 – 2024-11-18 (×41): 30 mL

## 2024-11-10 NOTE — Progress Notes (Addendum)
 Nutrition Follow-up  DOCUMENTATION CODES:   Severe malnutrition in context of chronic illness  INTERVENTION:   Change to nocturnal feeds of:  Osmolite 1.5@90ml /hr x 16 hrs overnight (from 1600-0800)  ProSource TF 20- Give 60ml BID via tube, each supplement provides 80kcal and 20g of protein.   Free water  flushes 30ml q4 hours to maintain tube patency   Regimen provides 2320kcal/day, 130g/day protein and 1263ml/day of free water    Juven Fruit Punch BID via tube, each serving provides 95kcal and 2.5g of protein (amino acids  glutamine and arginine)  MVI daily via tube   Weekly weights   Vitamin C 500mg  po BID x 30 days   Zinc  220mg  (50mg  elemental) po daily x 30 days   Copper  2mg  po daily x 30 days  Recheck copper  and CRP levels  Vitamin A  10,000 units po daily x 30 days   NUTRITION DIAGNOSIS:   Severe Malnutrition related to chronic illness as evidenced by moderate muscle depletion, severe muscle depletion, energy intake < or equal to 75% for > or equal to 1 month, edema, percent weight loss. -ongoing   GOAL:   Patient will meet greater than or equal to 90% of their needs -met   MONITOR:   PO intake, Diet advancement, Weight trends, Labs, TF tolerance, Skin, I & O's  ASSESSMENT:   58 y/o female with h/o appendectomy; cholecystectomy, OSA, GERD, Barrett's esophagus, hypothyroidism, chronic constipation, pancreatitis, HLD, GERD, NAFLD, bipolar disorder, depression, anxiety, HLD, morbid obesity s/p roux-en-y gastric bypass (2018) complicated by FTT, late dumping syndrome and marginal ulcer, hiatal hernia s/p lap repair with G-tube placement (2019 now removed), SBO secondary to internal hernia s/p diagnostic laparoscopy with lysis of adhesions (2020) and who is admitted with bacteremia, septic shock and SBO with biliary leak s/p ex lap with LOA, G-tube (32F) and VAC placement with open abdomen 11/18, s/p reexploration 11/20 and s/p laparotomy, re-exploration and abdominal  closure 11/22 complicated by left hand ischemia s/p embolectomy 11/22, aspiration PNA and AKI.  RD working remotely.  RD received consult to change pt to nocturnal tube feeds. Pt has been tolerating tube feeds well at goal rate. Pt initiated on a full liquid diet 11/28. Pt is documented to be eating 25% of most meals. RD will change pt over to nocturnal feeds per MD request. Pt does have a h/o late dumping syndrome with post prandial hypoglycemia so will need to monitor blood glucoses carefully. Recommend continuing tube feeds until wound healing is complete and pt is able to meet at least 75% of her needs via oral intake. RD will change pt over to a standard formula to assess how she tolerates. Will initiate tube feeds at 75ml/hr for tonight and increase to 44ml/hr tomorrow night if patient is tolerating. Pt with numerous vitamin deficiencies; supplementation is being provided. Pt completed 5 days of IV copper ; RD will provide oral supplementation until repeat copper  level returns. Will recheck zinc  and vitamin A  levels after 30 days of supplementation. RD will add Juven and vitamin C to support wound healing. No BM since 11/27; MD aware. Per chart, pt is down ~17lbs since admit but remains up ~10lbs from her UBW. Will order weekly weights.    Medications reviewed and include: lasix , insulin , synthroid , protonix , aldactone , zinc , heparin   Labs reviewed: Na 133(L), K 4.5 wnl, creat 0.39(L) Mg 2.1 wnl- 11/29 Copper  67(L), vitamin A  9.9(L), zinc  15(L)- 11/19 Wbc- 11.2(H), Hgb 7.2(L), Hct 22.2(L)-11/29 Cbgs- 92, 123, 121 x 24 hrs   UOP-  Diet Order:   Diet Order             Diet full liquid Room service appropriate? Yes with Assist; Fluid consistency: Thin  Diet effective now                  EDUCATION NEEDS:   Not appropriate for education at this time  Skin:  Skin Assessment: Skin Integrity Issues: Skin Integrity Issues:: Other (Comment) DTI: R Heel, Coccyx Incisions: Large  abdominal wound at surgical incision Other: necrotic L thumb (seen by Ortho- amputation at some point), neck wound  Last BM:  11/27 - type 7 x2  Height:   Ht Readings from Last 1 Encounters:  10/29/24 5' 4 (1.626 m)    Weight:   Wt Readings from Last 1 Encounters:  11/08/24 71.8 kg    Ideal Body Weight:  54.55 kg  BMI:  Body mass index is 27.17 kg/m.  Estimated Nutritional Needs:   Kcal:  2000-2300kcal/day  Protein:  110-125g/day  Fluid:  1.7-2.0L/day  Augustin Shams MS, RD, LDN If unable to be reached, please send secure chat to RD inpatient available from 8:00a-4:00p daily

## 2024-11-10 NOTE — Progress Notes (Signed)
 PHARMACY - ANTICOAGULATION CONSULT NOTE  Pharmacy Consult for heparin  infusion Indication: acute radial artery occlusion/ischemic L hand  Patient Measurements: Height: 5' 4 (162.6 cm) Weight: 71.8 kg (158 lb 4.6 oz) IBW/kg (Calculated) : 54.7 HEPARIN  DW (KG): 59.9  Vital Signs: Temp: 98.5 F (36.9 C) (11/30 0712) Temp Source: Oral (11/30 0712) BP: 118/78 (11/30 0712) Pulse Rate: 83 (11/30 0712)  Labs: Recent Labs    11/08/24 0501 11/08/24 0502 11/09/24 0424 11/10/24 0340  HGB 7.6*  --  7.2*  --   HCT 23.0*  --  22.2*  --   PLT 282  --  323  --   HEPARINUNFRC  --  0.55 0.47 0.32  CREATININE 0.37*  --  0.43* 0.39*    Estimated Creatinine Clearance: 75.3 mL/min (A) (by C-G formula based on SCr of 0.39 mg/dL (L)).   Assessment: 58 yo F presenting with E. Coli bacteremia 2/2 SBO with perforation. On 11/22 underwent closure of abdomen in OR and repair of left radial artery. Not on AC PTA. Pharmacy consulted for heparin  management.  Received 3 units of platelets 11/22.  Heparin  level is therapeutic at 0.32, on 1750 units/hr. Hgb 7.2, plt 323 on last check yesterday; plan to recheck CBC 12/1. No s/sx of bleeding or infusion issues.   Goal of Therapy:  Heparin  level 0.3-0.5 units/ml Monitor platelets by anticoagulation protocol: Yes   Plan:  Continue IV heparin  at 1750 units/hr Monitor daily heparin  level, CBC, and for s/sx of bleeding   Thank you for allowing pharmacy to participate in this patient's care,  Suzen Sour, PharmD, BCCCP Clinical Pharmacist  Phone: [phone] 11/10/2024 10:40 AM  Please check AMION for all [facility] phone numbers After 10:00 PM, call Main Pharmacy [phone]

## 2024-11-10 NOTE — Plan of Care (Signed)

## 2024-11-10 NOTE — Progress Notes (Signed)
 Progress Note  8 Days Post-Op  Subjective: No complaints this morning.  Tolerating full liquid diet, at goal rate tube feeds  Objective: Vital signs in last 24 hours: Temp:  [97.6 F (36.4 C)-98.8 F (37.1 C)] 98.5 F (36.9 C) (11/30 0712) Pulse Rate:  [79-87] 83 (11/30 0712) Resp:  [15-20] 15 (11/30 0712) BP: (115-131)/(55-82) 118/78 (11/30 0712) SpO2:  [96 %-98 %] 98 % (11/30 0712) Last BM Date : 11/08/24  Intake/Output from previous day: 11/29 0701 - 11/30 0700 In: 1449.8 [I.V.:472.8; NG/GT:917] Out: 3450 [Urine:3450] Intake/Output this shift: Total I/O In: -  Out: 300 [Urine:300]  PE: General: NAD HEENT: sclera anicteric Heart: regular, rate, and rhythm.   Lungs: Respiratory effort nonlabored Abd: soft, nondistended, appropriately ttp, G-tube present with sutures in place, midline wound with vac in place good seal; changed 11/28 see photo and WOC note   Lab Results:  Recent Labs    11/08/24 0501 11/09/24 0424  WBC 10.5 11.2*  HGB 7.6* 7.2*  HCT 23.0* 22.2*  PLT 282 323   BMET Recent Labs    11/09/24 0424 11/10/24 0340  NA 132* 133*  K 4.2 4.5  CL 95* 97*  CO2 30 30  GLUCOSE 126* 143*  BUN 14 15  CREATININE 0.43* 0.39*  CALCIUM  7.4* 7.8*   PT/INR No results for input(s): LABPROT, INR in the last 72 hours. CMP     Component Value Date/Time   NA 133 (L) 11/10/2024 0340   NA 142 09/23/2024 1030   K 4.5 11/10/2024 0340   CL 97 (L) 11/10/2024 0340   CO2 30 11/10/2024 0340   GLUCOSE 143 (H) 11/10/2024 0340   BUN 15 11/10/2024 0340   BUN 17 09/23/2024 1030   CREATININE 0.39 (L) 11/10/2024 0340   CREATININE 0.82 06/18/2013 1009   CALCIUM  7.8 (L) 11/10/2024 0340   PROT 4.1 (L) 11/04/2024 0500   PROT 6.5 06/28/2024 0842   ALBUMIN  <1.5 (L) 11/04/2024 0500   ALBUMIN  4.5 06/28/2024 0842   AST 88 (H) 11/04/2024 0500   ALT 64 (H) 11/04/2024 0500   ALKPHOS 48 11/04/2024 0500   BILITOT 0.8 11/04/2024 0500   BILITOT 0.3 06/28/2024 0842    GFRNONAA >60 11/10/2024 0340   GFRNONAA 87 06/18/2013 1009   GFRAA >60 08/04/2020 1038   GFRAA >89 06/18/2013 1009   Lipase     Component Value Date/Time   LIPASE 22 11/01/2024 0450       Studies/Results: No results found.  Anti-infectives: Anti-infectives (From admission, onward)    Start     Dose/Rate Route Frequency Ordered Stop   10/31/24 1400  piperacillin -tazobactam (ZOSYN ) IVPB 3.375 g        3.375 g 12.5 mL/hr over 240 Minutes Intravenous Every 8 hours 10/31/24 0751 11/08/24 0029   10/31/24 1100  micafungin  (MYCAMINE ) 100 mg in sodium chloride  0.9 % 100 mL IVPB        100 mg 105 mL/hr over 1 Hours Intravenous Every 24 hours 10/31/24 0901 11/04/24 1354   10/30/24 0600  piperacillin -tazobactam (ZOSYN ) IVPB 3.375 g  Status:  Discontinued        3.375 g 12.5 mL/hr over 240 Minutes Intravenous Every 8 hours 10/29/24 2350 10/31/24 0751   10/29/24 2145  piperacillin -tazobactam (ZOSYN ) IVPB 3.375 g        3.375 g 100 mL/hr over 30 Minutes Intravenous  Once 10/29/24 2137 10/29/24 2205   10/29/24 1700  cefTRIAXone  (ROCEPHIN ) 1 g in sodium chloride  0.9 % 100 mL IVPB  Status:  Discontinued        1 g 200 mL/hr over 30 Minutes Intravenous  Once 10/29/24 1646 10/29/24 1656   10/29/24 1700  metroNIDAZOLE  (FLAGYL ) IVPB 500 mg        500 mg 100 mL/hr over 60 Minutes Intravenous  Once 10/29/24 1646 10/29/24 1809   10/29/24 1700  cefTRIAXone  (ROCEPHIN ) 2 g in sodium chloride  0.9 % 100 mL IVPB        2 g 200 mL/hr over 30 Minutes Intravenous  Once 10/29/24 1656 10/29/24 1733        Assessment/Plan POD 12, s/p ex lap with LOA, g-tube placement in gastric remnant Dr. Vernetta 11/18 secondary to SBO with possible perforation POD 10, s/p ex lap with washout, Dr. Tanda 11/20 POD 8, s/p washout, abdominal closure Dr. Tanda 11/22 - continue VAC T/F - Continue advancing diet as tolerated and as approved by SLP, currently cleared for full liquids - Consider switching to cycled tube  feeds overnight to help with mobilization/therapies    FEN - IVFs per CCM, TF to 60mL/hr (goal). Prosource/supplements per RD recs.  FLD per SLP.  VTE - heparin  gtt ID - micafungin  5 doses completed; Zosyn  11/20>> 11/28   - per CCM -  E coli bacteremia -completed 1 week of Zosyn  Septic shock -resolved CAD/Heart failure - EF 20-25% on echo 11/21 AKI - resolved DM - SSI Leukopenia - resolved, secondary to sepsis Thrombocytopenia - resolved Hypocalcemia - per CCM Neck wound - unclear etiology, have asked nursing to place mepilex over this to protect it.  Avoid dressings or lines in this area. L hand ischemic changes -  VVS following, s/p embolectomy radial artery and repair, likely will need more surgery. Hand surgery consulted   DISPO- CIR declined, family working towards SNF.    LOS: 12 days   Mitzie DELENA Freund, MD  [facility] 11/10/2024, 9:09 AM Please see Amion for pager number during day hours 7:00am-4:30pm

## 2024-11-10 NOTE — Progress Notes (Signed)
 PROGRESS NOTE    Morgan [name]  FMW:[id number] DOB: Sep 08, 1966 DOA: 10/29/2024 PCP: [name], DO   Brief Narrative:  Morgan [name] is a 58 year old female with history of Roux-en-Y gastric bypass who presented to the emergency department with abdominal pain, lactic acidosis and lipase greater than 2800.  She had CCS evaluation and went for exploratory laparotomy.  Found to have dense adhesive band that was obstructing her proximal small bowel as well as biliary leak without evidence of perforation.  She had a gastrostomy tube placed with a wound VAC.  Initially on Levophed , vasopressin , intubated on bicarb drip postoperatively.  Patient's septic shock continue to improve, off pressors, NG tube remains in place, postoperative care per general surgery including diet advancement.  Patient to complete antibiotics on 11/27, completed antifungals 11/25. Left thumb ischemia status post thrombectomy appears to be stable, vascular surgery following. Patient's chronic comorbid conditions otherwise appear to be improving if not stable.  Assessment & Plan:   Principal Problem:   SBO (small bowel obstruction) s/p lap LOA 03/19/2019 Active Problems:   Malnutrition of moderate degree   Protein-calorie malnutrition, severe  Septic shock due to perforated SBO with peritonitis, E Coli bacteremia, resolved SBO due to adhesion Status post ex lap with lysis of adhesions 11/18; washouts on 11/20 and abdominal closure on 11/22 -wound VAC replacement/exchange 11/28 G-tube in gastric remnant History of Roux-en-Y bariatric surgery - TPN off as of 11/27, tube feeds ongoing with advancing diet, advanced per general surgery - zosyn  completed for ecoli bactermia; micafungin  completed 11/25 for peritonitis    Acute HFrEF- concerning given CAD hx vs septic cardiomyopathy, improving - Off dobutamine , avoid pressors given above - GDMT on hold due to hypotension per heart failure team -start low dose Lasix  -  Cardiac cath pending further recovery   Critical left hand ischemia due to thrombosed radial artery, s/p thrombectomy - Left radial arterial line discontinued 11/20, status post radial artery embolectomy with vascular surgery who notes left thumb necrosis distal to MP joint, defer to hand surgery will allow this to declare then plan for amputation. - Continue heparin  drip   Urinary retention, improving Acute kidney injury, resolved - Foley previously removed with noted difficulty voiding over the past 24h - plan to replace foley catheter later today if still unable to void/requiring repeated in/out cath - Strict I/O -renally dose meds, avoid nephrotoxic meds  Thrombocytopenia due to sepsis, resolved - no need for platelet transfusion; monitor   Acute anemia, recovering - Presumed blood loss vs worsening chronic anemia of chronic disease/aplastic anemia - Hemoglobin improved to 8.6 after transfusion, 1 unit 11/25   Acute hypoxic respiratory failure postoperatively RLL PNA- suspect aspiration from SBO - extubated successfully 11/24 - wean O2 for sat >92%  - IS and mobilize as able   Hypoglycemia  H/o DM2 but  A1c 5.3 and not on meds PTA-- resolved after Roux-en-Y? -monitor; not needing insulin  - watch with stopping steroids   Hypothyroidism - con't synthroid    Elevated transaminases, hyperbilirubinemia - stabilizing  - bilirubin back to normal    Depression/Anxiety  - Increase sertraline  back to home dose  Pressure injury noted, POA Wound 10/31/24 1716 Pressure Injury Coccyx Medial Deep Tissue Pressure Injury - Purple or maroon localized area of discolored intact skin or blood-filled blister due to damage of underlying soft tissue from pressure and/or shear. (Active)   DVT prophylaxis: SCD's Start: 10/29/24 2346 continues on heparin  drip Code Status:   Code Status: Full Code Family  Communication: None present  Status is: Inpatient  Dispo: The patient is from: Home               Anticipated d/c is to: To be determined              Anticipated d/c date is: To be determined              Patient currently not medically stable for discharge  Consultants:  PCCM, general surgery, vascular surgery  Antimicrobials:  Micafungin  completed Zosyn  completed  Subjective:   Objective: Vitals:   11/09/24 1910 11/09/24 2313 11/10/24 0308 11/10/24 0712  BP: (!) 127/59 (!) 130/58 (!) 131/55 118/78  Pulse: 82 79 80 83  Resp: 20 17 15 15   Temp: 98.8 F (37.1 C) 98.2 F (36.8 C) 97.6 F (36.4 C) 98.5 F (36.9 C)  TempSrc: Oral Oral Oral Oral  SpO2: 98% 98% 96% 98%  Weight:      Height:        Intake/Output Summary (Last 24 hours) at 11/10/2024 0727 Last data filed at 11/10/2024 0716 Gross per 24 hour  Intake 1389.78 ml  Output 3750 ml  Net -2360.22 ml   Filed Weights   11/04/24 0500 11/05/24 0700 11/08/24 0451  Weight: 71.6 kg 73 kg 71.8 kg    Examination:  General:  Pleasantly resting in bed, No acute distress. HEENT:  Normocephalic atraumatic.  Sclerae nonicteric, noninjected.  Extraocular movements intact bilaterally. Neck:  Without mass or deformity.  Trachea is midline. Lungs: Diminished without wheeze rales or rhonchi. Heart:  Regular rate and rhythm.  Without murmurs, rubs, or gallops. Abdomen: Nontender, G-tube noted, postoperative bandage clean dry intact Extremities: Without cyanosis, clubbing, edema, or obvious deformity.  Right upper extremity PICC line noted Skin:  Warm and dry, no erythema. Wound 10/31/24 1716 Pressure Injury Coccyx Medial Deep Tissue Pressure Injury - Purple or maroon localized area of discolored intact skin or blood-filled blister due to damage of underlying soft tissue from pressure and/or shear. (Active)    Data Reviewed: I have personally reviewed following labs and imaging studies  CBC: Recent Labs  Lab 11/05/24 0337 11/05/24 1055 11/06/24 0436 11/07/24 0440 11/08/24 0501 11/09/24 0424  WBC 9.1  --   11.3* 11.3* 10.5 11.2*  HGB 6.9* 7.9* 8.6* 7.9* 7.6* 7.2*  HCT 21.6* 24.7* 26.8* 23.9* 23.0* 22.2*  MCV 88.9  --  89.0 87.9 87.8 88.4  PLT 135*  --  199 246 282 323   Basic Metabolic Panel: Recent Labs  Lab 11/04/24 1729 11/05/24 0337 11/05/24 1055 11/05/24 1807 11/06/24 0436 11/06/24 1700 11/07/24 0440 11/08/24 0501 11/09/24 0424 11/10/24 0340  NA 135  --    < >  --  137  --  136 136 132* 133*  K 4.3  --    < >  --  3.6  --  4.2 4.1 4.2 4.5  CL 90*  --    < >  --  97*  --  97* 98 95* 97*  CO2 35*  --    < >  --  33*  --  32 28 30 30   GLUCOSE 524*  --    < >  --  116*  --  120* 129* 126* 143*  BUN 27*  --    < >  --  17  --  18 14 14 15   CREATININE 0.60  --    < >  --  0.46  --  0.47 0.37* 0.43* 0.39*  CALCIUM  7.1*  --    < >  --  7.3*  --  7.4* 7.5* 7.4* 7.8*  MG 2.3 2.0  --  1.8 2.0 1.6*  --  1.7 2.1  --   PHOS 2.8 2.2*  --  3.0 3.5 2.9  --   --   --   --    < > = values in this interval not displayed.   GFR: Estimated Creatinine Clearance: 75.3 mL/min (A) (by C-G formula based on SCr of 0.39 mg/dL (L)). Liver Function Tests: Recent Labs  Lab 11/04/24 0500  AST 88*  ALT 64*  ALKPHOS 48  BILITOT 0.8  PROT 4.1*  ALBUMIN  <1.5*   No results for input(s): LIPASE, AMYLASE in the last 168 hours.  No results for input(s): AMMONIA in the last 168 hours. Coagulation Profile: No results for input(s): INR, PROTIME in the last 168 hours.  Cardiac Enzymes: No results for input(s): CKTOTAL, CKMB, CKMBINDEX, TROPONINI in the last 168 hours. BNP (last 3 results) Recent Labs    11/01/24 0920  PROBNP >35,000.0*   HbA1C: No results for input(s): HGBA1C in the last 72 hours. CBG: Recent Labs  Lab 11/09/24 1625 11/09/24 1908 11/09/24 2310 11/10/24 0309 11/10/24 0714  GLUCAP 128* 116* 114* 121* 123*   Lipid Profile: No results for input(s): CHOL, HDL, LDLCALC, TRIG, CHOLHDL, LDLDIRECT in the last 72 hours.  Thyroid  Function  Tests: No results for input(s): TSH, T4TOTAL, FREET4, T3FREE, THYROIDAB in the last 72 hours. Anemia Panel: No results for input(s): VITAMINB12, FOLATE, FERRITIN, TIBC, IRON , RETICCTPCT in the last 72 hours. Sepsis Labs: No results for input(s): PROCALCITON, LATICACIDVEN in the last 168 hours.   Recent Results (from the past 240 hours)  MRSA Next Gen by PCR, Nasal     Status: None   Collection Time: 11/02/24  8:54 AM   Specimen: Nasal Mucosa; Nasal Swab  Result Value Ref Range Status   MRSA by PCR Next Gen NOT DETECTED NOT DETECTED Final    Comment: (NOTE) The GeneXpert MRSA Assay (FDA approved for NASAL specimens only), is one component of a comprehensive MRSA colonization surveillance program. It is not intended to diagnose MRSA infection nor to guide or monitor treatment for MRSA infections. Test performance is not FDA approved in patients less than 34 years old. Performed at [facility], [address] [place], [place] [address]          Radiology Studies: No results found.      Scheduled Meds:  acetaminophen   650 mg Per Tube Q6H   Chlorhexidine  Gluconate Cloth  6 each Topical Daily   feeding supplement (PROSource TF20)  60 mL Per Tube Daily   furosemide   40 mg Per Tube Daily   insulin  aspart  0-15 Units Subcutaneous Q4H   levothyroxine   112 mcg Per Tube QAC breakfast   mouth rinse  15 mL Mouth Rinse 4 times per day   pantoprazole  (PROTONIX ) IV  40 mg Intravenous Q12H   sertraline   200 mg Per Tube Daily   spironolactone   12.5 mg Per Tube Daily   thiamine  (VITAMIN B1) injection  100 mg Intravenous Q24H   zinc  sulfate (50mg  elemental zinc )  220 mg Per Tube Daily   Continuous Infusions:  feeding supplement (VITAL AF 1.2 CAL) 1,000 mL (11/10/24 0036)   heparin  1,750 Units/hr (11/10/24 0339)     LOS: 12 days   Time spent:  Elsie JAYSON Montclair, DO [facility]  If 7PM-7AM, please contact  night-coverage [email or web address]  11/10/2024, 7:27 AM

## 2024-11-11 ENCOUNTER — Telehealth (HOSPITAL_COMMUNITY): Payer: Self-pay | Admitting: Pharmacy Technician

## 2024-11-11 ENCOUNTER — Other Ambulatory Visit (HOSPITAL_COMMUNITY): Payer: Self-pay

## 2024-11-11 ENCOUNTER — Inpatient Hospital Stay (HOSPITAL_COMMUNITY)

## 2024-11-11 DIAGNOSIS — E878 Other disorders of electrolyte and fluid balance, not elsewhere classified: Secondary | ICD-10-CM | POA: Diagnosis not present

## 2024-11-11 DIAGNOSIS — I5020 Unspecified systolic (congestive) heart failure: Secondary | ICD-10-CM | POA: Diagnosis not present

## 2024-11-11 DIAGNOSIS — I5021 Acute systolic (congestive) heart failure: Secondary | ICD-10-CM

## 2024-11-11 DIAGNOSIS — E440 Moderate protein-calorie malnutrition: Secondary | ICD-10-CM | POA: Diagnosis not present

## 2024-11-11 DIAGNOSIS — K56609 Unspecified intestinal obstruction, unspecified as to partial versus complete obstruction: Secondary | ICD-10-CM | POA: Diagnosis not present

## 2024-11-11 LAB — BASIC METABOLIC PANEL WITH GFR
Anion gap: 5 (ref 5–15)
BUN: 23 mg/dL — ABNORMAL HIGH (ref 6–20)
CO2: 37 mmol/L — ABNORMAL HIGH (ref 22–32)
Calcium: 8 mg/dL — ABNORMAL LOW (ref 8.9–10.3)
Chloride: 92 mmol/L — ABNORMAL LOW (ref 98–111)
Creatinine, Ser: 0.38 mg/dL — ABNORMAL LOW (ref 0.44–1.00)
GFR, Estimated: >60 mL/min (ref >60)
Glucose, Bld: 131 mg/dL — ABNORMAL HIGH (ref 70–99)
Potassium: 4.9 mmol/L (ref 3.5–5.1)
Sodium: 134 mmol/L — ABNORMAL LOW (ref 135–145)

## 2024-11-11 LAB — CBC
HCT: 23.1 % — ABNORMAL LOW (ref 36.0–46.0)
Hemoglobin: 7.4 g/dL — ABNORMAL LOW (ref 12.0–15.0)
MCH: 28.9 pg (ref 26.0–34.0)
MCHC: 32 g/dL (ref 30.0–36.0)
MCV: 90.2 fL (ref 80.0–100.0)
Platelets: 496 K/uL — ABNORMAL HIGH (ref 150–400)
RBC: 2.56 MIL/uL — ABNORMAL LOW (ref 3.87–5.11)
RDW: 14.2 % (ref 11.5–15.5)
WBC: 13 K/uL — ABNORMAL HIGH (ref 4.0–10.5)
nRBC: 0 % (ref 0.0–0.2)

## 2024-11-11 LAB — C-REACTIVE PROTEIN: CRP: 38.3 mg/dL — ABNORMAL HIGH (ref <1.0)

## 2024-11-11 LAB — GLUCOSE, CAPILLARY
Glucose-Capillary: 106 mg/dL — ABNORMAL HIGH (ref 70–99)
Glucose-Capillary: 117 mg/dL — ABNORMAL HIGH (ref 70–99)
Glucose-Capillary: 119 mg/dL — ABNORMAL HIGH (ref 70–99)
Glucose-Capillary: 122 mg/dL — ABNORMAL HIGH (ref 70–99)
Glucose-Capillary: 181 mg/dL — ABNORMAL HIGH (ref 70–99)
Glucose-Capillary: 96 mg/dL (ref 70–99)

## 2024-11-11 LAB — ECHOCARDIOGRAM LIMITED
Area-P 1/2: 4.21 cm2
Calc EF: 64.2 %
Height: 64 in
S' Lateral: 2.9 cm
Single Plane A2C EF: 62.6 %
Single Plane A4C EF: 67.3 %
Weight: 2500.9 [oz_av]

## 2024-11-11 LAB — MAGNESIUM: Magnesium: 1.7 mg/dL (ref 1.7–2.4)

## 2024-11-11 LAB — HEPARIN LEVEL (UNFRACTIONATED): Heparin Unfractionated: 0.18 [IU]/mL — ABNORMAL LOW (ref 0.30–0.70)

## 2024-11-11 LAB — PHOSPHORUS: Phosphorus: 3.9 mg/dL (ref 2.5–4.6)

## 2024-11-11 LAB — PREPARE RBC (CROSSMATCH)

## 2024-11-11 MED ORDER — LOSARTAN POTASSIUM 25 MG PO TABS
25.0000 mg | ORAL_TABLET | Freq: Every day | ORAL | Status: DC
  Administered 2024-11-11 – 2024-11-18 (×8): 25 mg via ORAL
  Filled 2024-11-11 (×8): qty 1

## 2024-11-11 MED ORDER — ONDANSETRON HCL 4 MG/2ML IJ SOLN
4.0000 mg | Freq: Four times a day (QID) | INTRAMUSCULAR | Status: DC | PRN
  Filled 2024-11-11: qty 2

## 2024-11-11 MED ORDER — OXYCODONE HCL 5 MG PO TABS
5.0000 mg | ORAL_TABLET | ORAL | Status: DC | PRN
  Administered 2024-11-11 – 2024-11-14 (×11): 10 mg
  Filled 2024-11-11 (×11): qty 2

## 2024-11-11 MED ORDER — SODIUM CHLORIDE 0.9% IV SOLUTION: Freq: Once | INTRAVENOUS | Status: AC

## 2024-11-11 MED ORDER — MAGNESIUM SULFATE 2 GM/50ML IV SOLN
2.0000 g | Freq: Once | INTRAVENOUS | Status: AC
  Administered 2024-11-11: 2 g via INTRAVENOUS
  Filled 2024-11-11: qty 50

## 2024-11-11 MED ORDER — ENSURE PLUS HIGH PROTEIN PO LIQD
237.0000 mL | Freq: Two times a day (BID) | ORAL | Status: DC
  Administered 2024-11-11: 237 mL via ORAL

## 2024-11-11 MED ORDER — APIXABAN 5 MG PO TABS
5.0000 mg | ORAL_TABLET | Freq: Two times a day (BID) | ORAL | Status: DC
  Administered 2024-11-11 – 2024-11-18 (×15): 5 mg via ORAL
  Filled 2024-11-11 (×15): qty 1

## 2024-11-11 MED ORDER — ONDANSETRON HCL 4 MG PO TABS: 4.0000 mg | ORAL_TABLET | Freq: Four times a day (QID) | ORAL | Status: DC | PRN

## 2024-11-11 NOTE — Progress Notes (Signed)
 SLP Cancellation Note  Patient Details Name: MACHAELA CATERINO MRN: [id number] DOB: [birth year]   Cancelled treatment:       Reason Eval/Treat Not Completed: New orders received as pt is medically able to advance beyond FLD. Most recent visit 11/28 recommends advancing diet to regular once able without ongoing SLP f/u. Discussed with PA, who will advance pt's diet without repeating an SLP evaluation.    Damien Blumenthal, M.A., CCC-SLP Speech Language Pathology, Acute Rehabilitation Services  Secure Chat preferred [phone]  11/11/2024, 11:48 AM

## 2024-11-11 NOTE — Progress Notes (Signed)
 Advanced Heart Failure Rounding Note  Patient Profile:    58 y/o female admitted w/ SBO and septic shock d/t e.coli bacteremia, s/p exploratory laparotomy w/ lysis of adhesions and placement of gastrostomy tube. Post op course c/b new systolic heart failure w/ CS and acute radial artery occlusion/ischemic left hand requiring radial artery embolectomy.   Subjective:    11/22 To OR after ab wound closure and revasc of LUE   Up in the chair with PT. Feeling ok. Some pain at incision. No SOB.  Objective:    Vital Signs:   Temp:  [97.9 F (36.6 C)-98.4 F (36.9 C)] 98.4 F (36.9 C) (12/01 0715) Pulse Rate:  [80-97] 97 (12/01 0421) Resp:  [13-20] 13 (12/01 0715) BP: (124-140)/(52-69) 138/68 (12/01 0715) SpO2:  [94 %-97 %] 97 % (12/01 0715) Weight:  [70.9 kg] 70.9 kg (11/30 2312) Last BM Date : 11/08/24  Weight change: Filed Weights   11/05/24 0700 11/08/24 0451 11/10/24 2312  Weight: 73 kg 71.8 kg 70.9 kg   Intake/Output:  Intake/Output Summary (Last 24 hours) at 11/11/2024 1041 Last data filed at 11/11/2024 0800 Gross per 24 hour  Intake 240 ml  Output 2350 ml  Net -2110 ml    Physical Exam: General: Haggard appearing. No distress  Cardiac: No murmurs. S1 and S2 present Abdomen: Soft, non-distended.  Extremities: Warm and dry.  No peripheral edema.  Neuro: A&O x3. Affect flat  Telemetry: SR in 70s (personally reviewed)  Labs: Basic Metabolic Panel: Recent Labs  Lab 11/05/24 0337 11/05/24 1055 11/05/24 1807 11/06/24 0436 11/06/24 1700 11/07/24 0440 11/08/24 0501 11/09/24 0424 11/10/24 0340 11/11/24 0430  NA  --    < >  --  137  --  136 136 132* 133* 134*  K  --    < >  --  3.6  --  4.2 4.1 4.2 4.5 4.9  CL  --    < >  --  97*  --  97* 98 95* 97* 92*  CO2  --    < >  --  33*  --  32 28 30 30  37*  GLUCOSE  --    < >  --  116*  --  120* 129* 126* 143* 131*  BUN  --    < >  --  17  --  18 14 14 15  23*  CREATININE  --    < >  --  0.46  --  0.47 0.37* 0.43*  0.39* 0.38*  CALCIUM   --    < >  --  7.3*  --  7.4* 7.5* 7.4* 7.8* 8.0*  MG 2.0  --  1.8 2.0 1.6*  --  1.7 2.1  --  1.7  PHOS 2.2*  --  3.0 3.5 2.9  --   --   --   --  3.9   < > = values in this interval not displayed.   CBC: Recent Labs  Lab 11/06/24 0436 11/07/24 0440 11/08/24 0501 11/09/24 0424 11/11/24 0430  WBC 11.3* 11.3* 10.5 11.2* 13.0*  HGB 8.6* 7.9* 7.6* 7.2* 7.4*  HCT 26.8* 23.9* 23.0* 22.2* 23.1*  MCV 89.0 87.9 87.8 88.4 90.2  PLT 199 246 282 323 496*   BNP (last 3 results) No results for input(s): BNP in the last 8760 hours.  ProBNP (last 3 results) Recent Labs    11/01/24 0920  PROBNP >35,000.0*    Medications:    Scheduled Medications:  acetaminophen   650 mg Per  Tube Q6H   ascorbic acid  500 mg Oral BID   Chlorhexidine  Gluconate Cloth  6 each Topical Daily   copper   2 mg Oral Daily   feeding supplement (OSMOLITE 1.5 CAL)  1,440 mL Per Tube Q24H   feeding supplement (PROSource TF20)  60 mL Per Tube BID   free water   30 mL Per Tube Q4H   furosemide   40 mg Per Tube Daily   insulin  aspart  0-15 Units Subcutaneous Q4H   levothyroxine   112 mcg Per Tube QAC breakfast   multivitamin with minerals  1 tablet Oral Daily   nutrition supplement (JUVEN)  1 packet Per Tube BID BM   mouth rinse  15 mL Mouth Rinse 4 times per day   pantoprazole  (PROTONIX ) IV  40 mg Intravenous Q12H   polyethylene glycol  17 g Oral BID   sertraline   200 mg Per Tube Daily   spironolactone   12.5 mg Per Tube Daily   vitamin A   10,000 Units Oral Daily   zinc  sulfate (50mg  elemental zinc )  220 mg Per Tube Daily    Infusions:  heparin  1,950 Units/hr (11/11/24 0952)    PRN Medications: HYDROmorphone  (DILAUDID ) injection, lip balm, mouth rinse, oxyCODONE , sodium chloride  flush  Assessment:   1. Septic shock with e.coli bacteremia +/-  Cardiogenic shock, New Cardiomyopathy, Shock resolved  - Patient was admitted with small bowel obstruction and septic shock. Patient underwent  exploratory laparotomy exploratory laparotomy was found to have a densities at the end obstructing the proximal small bowel near Roux-en-Y anastomosis.   - Bcx + for e.coli - Completed abx and stress-dose steroids per CCM - ab closure on 11/22 - Echo showed LVEF of 20-25% with multiple wall motion abnormalities  - POCUS ECHO 11/22 EF ~30-35% with RWMA concerning for underlying CAD - Suspect stress CM. - Co-ox stable off DBA - Volume looks okay on exam. Continue lasix  40 mg daily. - Continue spiro 12.5 mg daily - Check ltd echo. If EF not improving, dicuss cath with Dr. Zenaida.   2. Acute systolic HF - plan as above.  - co-ox stable off support - continue lasix  40 mg daily  - continue spiro 12.5 mg daily  - BP improved. Start losartan 25 mg daily. CTM K  3. Acute hypoxic resp failure - extubated  - stable on  RA  4. Non-Obstructive CAD - Coronary CTA in 09/2024 showed coronary calcium  score of 315 (90th percentile for age and sex) and mild mixed nonobstructive CAD with 1-24% stenosis of the proximal RCA and 25-49% stenosis of LAD. - Off aspirin  with severe thrombocytopenia this admit  - Intolerant to statins in the past.  - Echo concerning for new RWMA. Will need to consider cath via femoral approach prior to d/c   5. Ischemic Left Hand - VVS following. S/p revasc 11/22  - On heparin   - need surgery to weigh in on AC vs. procedure  6. Severe thrombocytopenia - resolved    9. Anemia: - Hgb slowly drifting down after transfusion, 8.6>7.6>7.4 - give 1u RBCs  Working on SNF placement   Length of Stay: 13  Chantele Corado, NP 11/11/2024, 10:41 AM  Advanced Heart Failure Team Pager [phone] (M-F; 7a - 4p)  Please contact [facility] for night-coverage after hours (4p -7a ) and weekends on [email or web address]   Consider coronary angio (femoral approach) next week.

## 2024-11-11 NOTE — Progress Notes (Signed)
 Occupational Therapy Treatment Patient Details Name: Morgan [name] MRN: [id number] DOB: 03/28/1966 Today's Date: 11/11/2024   History of present illness 58 yo F adm 10/29/24 with SBO s/p ex lap with g-tube placement same date. 11/20 and 11/22 repeat ex lap with VAC. VDRF 11/18-11/24. Pressor support with thrombosed radial artery s/p Lt radial artery embolectomy11/22. PMH: SBO, gastric bypass, DM, OSA, HLD, hypotension, bradycardia, Lt TSA   OT comments  Pt progressing toward goals, able to complete bed mobility and transfer bed > chair with min A HHA this session. Pt participating in LUE therex and reiterated importance of mobility/elevation for edema control. Pt with limited extension in DIP/PIP joints in digits 2-5, impaired [facility] for opening toothpaste tube, but is able to hold empty cup during oral care. Pt presenting with impairments listed below, will follow acutely. Patient will benefit from intensive inpatient follow-up therapy, >3 hours/day to maximize safety/ind with ADL/functional mobility.       If plan is discharge home, recommend the following:  Two people to help with walking and/or transfers;A lot of help with bathing/dressing/bathroom;Assistance with cooking/housework;Direct supervision/assist for medications management;Direct supervision/assist for financial management;Assist for transportation;Help with stairs or ramp for entrance   Equipment Recommendations  Other (comment) (defer)    Recommendations for Other Services PT consult;Rehab consult    Precautions / Restrictions Precautions Precautions: Fall;Other (comment) Recall of Precautions/Restrictions: Impaired Precaution/Restrictions Comments: Abd VAC, g-tube, foley, PICC Restrictions Other Position/Activity Restrictions: did not weight bear through LUE, however no formal orders from embolectomy       Mobility Bed Mobility Overal bed mobility: Needs Assistance Bed Mobility: Rolling   Sidelying to sit: Min  assist       General bed mobility comments: cues to scoot to EOB    Transfers Overall transfer level: Needs assistance Equipment used: 1 person hand held assist Transfers: Sit to/from Stand Sit to Stand: Min assist     Step pivot transfers: Min assist           Balance Overall balance assessment: Needs assistance Sitting-balance support: Feet supported, No upper extremity supported Sitting balance-Leahy Scale: Good Sitting balance - Comments: EOB unsupported   Standing balance support: Bilateral upper extremity supported Standing balance-Leahy Scale: Poor Standing balance comment: UE support in standing                           ADL either performed or assessed with clinical judgement   ADL Overall ADL's : Needs assistance/impaired     Grooming: Oral care;Sitting;Minimal assistance Grooming Details (indicate cue type and reason): min A to squeeze toothpaste from toothbrush                 Toilet Transfer: Minimal assistance;Stand-pivot;BSC/3in1 Toilet Transfer Details (indicate cue type and reason): simulated bed  >chair         Functional mobility during ADLs: Minimal assistance      Extremity/Trunk Assessment Upper Extremity Assessment LUE Deficits / Details: grasp 3/5, denies sensation changs, limited extension in DIP/PIP joints in digits 2-5 LUE Coordination: decreased fine motor;decreased gross motor   Lower Extremity Assessment Lower Extremity Assessment: Defer to PT evaluation        Vision   Vision Assessment?: No apparent visual deficits   Perception Perception Perception: Not tested   Praxis Praxis Praxis: Not tested   Communication Communication Communication: No apparent difficulties   Cognition Arousal: Alert Behavior During Therapy: Flat affect Cognition: Cognition impaired   Orientation impairments: Situation, Time  Awareness: Online awareness impaired Memory impairment (select all impairments): Working  Biochemist, Clinical functioning impairment (select all impairments): Sequencing, Reasoning                   Following commands: Impaired Following commands impaired: Follows one step commands with increased time      Cueing   Cueing Techniques: Verbal cues, Tactile cues, Gestural cues  Exercises Hand Exercises Wrist Flexion: AROM, Left, 10 reps Wrist Extension: AROM, Left, AAROM, 10 reps Digit Composite Flexion: AROM, Left, 10 reps, Seated Composite Extension: AROM, Left, 10 reps, Seated Digit Composite Abduction: Left, AROM, 10 reps, Seated Digit Composite Adduction: Left, AAROM, 10 reps, Seated Digit Lifts: Left, AROM, 10 reps, Seated Opposition: AAROM, PROM, Left, 5 reps, Seated Other Exercises Other Exercises: isolated DIP extension L digits 2-5 Other Exercises: Isolated PIP extension L digits 2-5    Shoulder Instructions       General Comments VSS    Pertinent Vitals/ Pain       Pain Assessment Pain Assessment: No/denies pain  Home Living                                          Prior Functioning/Environment              Frequency  Min 3X/week        Progress Toward Goals  OT Goals(current goals can now be found in the care plan section)  Progress towards OT goals: Progressing toward goals  Acute Rehab OT Goals Patient Stated Goal: did not state OT Goal Formulation: With patient Time For Goal Achievement: 11/18/24 Potential to Achieve Goals: Good ADL Goals Pt Will Perform Upper Body Dressing: with supervision;sitting Pt Will Perform Lower Body Dressing: with min assist;sitting/lateral leans;sit to/from stand Pt Will Transfer to Toilet: with min assist;stand pivot transfer;bedside commode Pt/caregiver will Perform Home Exercise Program: Increased ROM;Increased strength;Both right and left upper extremity;With minimal assist;With written HEP provided Additional ADL Goal #1: Pt will perform bed mobility min A in prep for  seated ADLs  Plan      Co-evaluation                 AM-PAC OT 6 Clicks Daily Activity     Outcome Measure   Help from another person eating meals?: A Little Help from another person taking care of personal grooming?: A Little Help from another person toileting, which includes using toliet, bedpan, or urinal?: A Lot Help from another person bathing (including washing, rinsing, drying)?: A Lot Help from another person to put on and taking off regular upper body clothing?: A Lot Help from another person to put on and taking off regular lower body clothing?: A Lot 6 Click Score: 14    End of Session Equipment Utilized During Treatment: Oxygen   OT Visit Diagnosis: Unsteadiness on feet (R26.81);Other abnormalities of gait and mobility (R26.89);Muscle weakness (generalized) (M62.81)   Activity Tolerance Patient tolerated treatment well   Patient Left in chair;with call bell/phone within reach;with chair alarm set   Nurse Communication Mobility status        Time: 9160-9096 OT Time Calculation (min): 24 min  Charges: OT General Charges $OT Visit: 1 Visit OT Treatments $Self Care/Home Management : 8-22 mins $Therapeutic Exercise: 8-22 mins  Leilanny Fluitt K, OTD, OTR/L SecureChat Preferred Acute Rehab [phone]   Laneta POUR Koonce 11/11/2024, 9:16 AM

## 2024-11-11 NOTE — Telephone Encounter (Signed)
 Patient Product/process development scientist completed.    The patient is insured through [facility]. Patient has Medicare and is not eligible for a copay card, but may be able to apply for patient assistance or Medicare RX Payment Plan (Patient Must reach out to their plan, if eligible for payment plan), if available.    Ran test claim for Eliquis 5 mg and the current 30 day co-pay is $0.00.   This test claim was processed through [facility]- copay amounts may vary at other pharmacies due to pharmacy/plan contracts, or as the patient moves through the different stages of their insurance plan.     Reyes Sharps, CPHT Pharmacy Technician Patient Advocate Specialist Lead [facility] Patient Advocate Team Direct Number: [phone]  Fax: [phone]

## 2024-11-11 NOTE — Progress Notes (Addendum)
 Nutrition Brief Note  Patient diet advanced to full liquids on 11/28 and GI soft this morning. MD request to transition patient to nocturnal feedings over the weekend (11/30). Also transitioned to standard TF formula at that time. Patient ran at 98ml/hr last night and tolerated well. Scheduled to increase to 90ml/hr x16 hours this evening.   Will assess PO intake and ability to modify TF regimen as tolerance of GI soft diet assessed. Spoke with RN about accurate PO intake documentation. She continues on Juven BID for wound healing. Has completed copper  repletion. Lab redraw entered along with CRP to assess repletion efforts.   Spoke with patient at bedside who was nauseated. This has not been her recent baseline. RN reports she ate approximately 75% of her breakfast this morning, which consisted of Jell-O, yogurt, and grits. Last BM documented 11/28. Monitor bowel frequency and need for modification to bowel regimen. Noted with Miralax  ordered BID and started 11/30.    INTERVENTION:    Continue nocturnal feedings via G-tube:  Osmolite 1.5 @90ml /hr x 16 hrs overnight (from 1600-0800) ProSource TF 20- Give 60ml BID via tube, each supplement provides 80kcal and 20g of protein.  Free water  flushes 30ml q4 hours to maintain tube patency  Regimen provides 2320kcal/day, 130g/day protein and 1253ml/day of free water    Juven Fruit Punch BID via tube, each serving provides 95kcal and 2.5g of protein (amino acids  glutamine and arginine)  MVI daily via tube   Weekly weights   Vitamin/Mineral Repletion Vitamin C 500mg  po BID x 30 days (ends 12/30)  Zinc  220mg  (50mg  elemental) po daily x 30 days (ends 12/08)  Copper  2mg  po daily x 30 days (ended 11/30)  Recheck copper  and CRP levels  Vitamin A  10,000 units po daily x 30 days (ends 12/12/2024)  NUTRITION DIAGNOSIS:  Severe Malnutrition related to chronic illness as evidenced by moderate muscle depletion, severe muscle depletion, energy intake <  or equal to 75% for > or equal to 1 month, edema, percent weight loss. -ongoing    GOAL:  Patient will meet greater than or equal to 90% of their needs -met   Blair Deaner MS, RD, LDN Registered Dietitian Clinical Nutrition RD Inpatient Contact Info in Amion

## 2024-11-11 NOTE — Progress Notes (Signed)
  Echocardiogram 2D Echocardiogram has been performed.  Koleen KANDICE Popper, RDCS 11/11/2024, 2:19 PM

## 2024-11-11 NOTE — Patient Instructions (Incomplete)
Below is our plan:  We will ***  Please make sure you are staying well hydrated. I recommend 50-60 ounces daily. Well balanced diet and regular exercise encouraged. Consistent sleep schedule with 6-8 hours recommended.   Please continue follow up with care team as directed.   Follow up with *** in ***  You may receive a survey regarding today's visit. I encourage you to leave honest feed back as I do use this information to improve patient care. Thank you for seeing me today!   GENERAL HEADACHE INFORMATION:   Natural supplements: Magnesium Oxide or Magnesium Glycinate 500 mg at bed (up to 800 mg daily) Coenzyme Q10 300 mg in AM Vitamin B2- 200 mg twice a day   Add 1 supplement at a time since even natural supplements can have undesirable side effects. You can sometimes buy supplements cheaper (especially Coenzyme Q10) at www.puritan.com or at [employer].  Migraine with aura: There is increased risk for stroke in women with migraine with aura and a contraindication for the combined contraceptive pill for use by women who have migraine with aura. The risk for women with migraine without aura is lower. However other risk factors like smoking are far more likely to increase stroke risk than migraine. There is a recommendation for no smoking and for the use of OCPs without estrogen such as progestogen only pills particularly for women with migraine with aura.. People who have migraine headaches with auras may be 3 times more likely to have a stroke caused by a blood clot, compared to migraine patients who don't see auras. Women who take hormone-replacement therapy may be 30 percent more likely to suffer a clot-based stroke than women not taking medication containing estrogen. Other risk factors like smoking and high blood pressure may be  much more important.    Vitamins and herbs that show potential:   Magnesium: Magnesium (250 mg twice a day or 500 mg at bed) has a relaxant effect on smooth muscles  such as blood vessels. Individuals suffering from frequent or daily headache usually have low magnesium levels which can be increase with daily supplementation of 400-750 mg. Three trials found 40-90% average headache reduction  when used as a preventative. Magnesium may help with headaches are aura, the best evidence for magnesium is for migraine with aura is its thought to stop the cortical spreading depression we believe is the pathophysiology of migraine aura.Magnesium also demonstrated the benefit in menstrually related migraine.  Magnesium is part of the messenger system in the serotonin cascade and it is a good muscle relaxant.  It is also useful for constipation which can be a side effect of other medications used to treat migraine. Good sources include nuts, whole grains, and tomatoes. Side Effects: loose stool/diarrhea  Riboflavin (vitamin B 2) 200 mg twice a day. This vitamin assists nerve cells in the production of ATP a principal energy storing molecule.  It is necessary for many chemical reactions in the body.  There have been at least 3 clinical trials of riboflavin using 400 mg per day all of which suggested that migraine frequency can be decreased.  All 3 trials showed significant improvement in over half of migraine sufferers.  The supplement is found in bread, cereal, milk, meat, and poultry.  Most Americans get more riboflavin than the recommended daily allowance, however riboflavin deficiency is not necessary for the supplements to help prevent headache. Side effects: energizing, green urine   Coenzyme Q10: This is present in almost all cells in   the body and is critical component for the conversion of energy.  Recent studies have shown that a nutritional supplement of CoQ10 can reduce the frequency of migraine attacks by improving the energy production of cells as with riboflavin.  Doses of 150 mg twice a day have been shown to be effective.   Melatonin: Increasing evidence shows  correlation between melatonin secretion and headache conditions.  Melatonin supplementation has decreased headache intensity and duration.  It is widely used as a sleep aid.  Sleep is natures way of dealing with migraine.  A dose of 3 mg is recommended to start for headaches including cluster headache. Higher doses up to 15 mg has been reviewed for use in Cluster headache and have been used. The rationale behind using melatonin for cluster is that many theories regarding the cause of Cluster headache center around the disruption of the normal circadian rhythm in the brain.  This helps restore the normal circadian rhythm.   HEADACHE DIET: Foods and beverages which may trigger migraine Note that only 20% of headache patients are food sensitive. You will know if you are food sensitive if you get a headache consistently 20 minutes to 2 hours after eating a certain food. Only cut out a food if it causes headaches, otherwise you might remove foods you enjoy! What matters most for diet is to eat a well balanced healthy diet full of vegetables and low fat protein, and to not miss meals.   Chocolate, other sweets ALL cheeses except cottage and cream cheese Dairy products, yogurt, sour cream, ice cream Liver Meat extracts (Bovril, Marmite, meat tenderizers) Meats or fish which have undergone aging, fermenting, pickling or smoking. These include: Hotdogs,salami,Lox,sausage, mortadellas,smoked salmon, pepperoni, Pickled herring Pods of broad bean (English beans, Chinese pea pods, Italian (fava) beans, lima and navy beans Ripe avocado, ripe banana Yeast extracts or active yeast preparations such as Brewer's or Fleishman's (commercial bakes goods are permitted) Tomato based foods, pizza (lasagna, etc.)   MSG (monosodium glutamate) is disguised as many things; look for these common aliases: Monopotassium glutamate Autolysed yeast Hydrolysed protein Sodium caseinate "flavorings" "all natural  preservatives" Nutrasweet   Avoid all other foods that convincingly provoke headaches.   Resources: The Dizzy Cook, Heal Your Headache Diet, migrainestrong.com  https://www.migrainestrong.com/heal-your-headache-diet-the-migraine-diet/   Caffeine and Migraine For patients that have migraine, caffeine intake more than 3 days per week can lead to dependency and increased migraine frequency. I would recommend cutting back on your caffeine intake as best you can. The recommended amount of caffeine is 200-300 mg daily, although migraine patients may experience dependency at even lower doses. While you may notice an increase in headache temporarily, cutting back will be helpful for headaches in the long run. For more information on caffeine and migraine, visit: https://americanmigrainefoundation.org/resource-library/caffeine-and-migraine/   Headache Prevention Strategies:   1. Maintain a headache diary; learn to identify and avoid triggers.  - This can be a simple note where you log when you had a headache, associated symptoms, and medications used - There are several smartphone apps developed to help track migraines: Migraine Buddy, Migraine Monitor, Curelator N1-Headache App   Common triggers include: Emotional triggers: Emotional/Upset family or friends Emotional/Upset occupation Business reversal/success Anticipation anxiety Crisis-serious Post-crisis periodNew job/position   Physical triggers: Vacation Day Weekend Strenuous Exercise High Altitude Location New Move Menstrual Day Physical Illness Oversleep/Not enough sleep Weather changes Light: Photophobia or light sesnitivity treatment involves a balance between desensitization and reduction in overly strong input. Use dark polarized glasses outside, but   not inside. Avoid bright or fluorescent light, but do not dim environment to the point that going into a normally lit room hurts. Consider FL-41 tint lenses, which reduce the most  irritating wavelengths without blocking too much light.  These can be obtained at axonoptics.com or theraspecs.com Foods: see list above.   2. Limit use of acute treatments (over-the-counter medications, triptans, etc.) to no more than 2 days per week or 10 days per month to prevent medication overuse headache (rebound headache).     3. Follow a regular schedule (including weekends and holidays): Don't skip meals. Eat a balanced diet. 8 hours of sleep nightly. Minimize stress. Exercise 30 minutes per day. Being overweight is associated with a 5 times increased risk of chronic migraine. Keep well hydrated and drink 6-8 glasses of water per day.   4. Initiate non-pharmacologic measures at the earliest onset of your headache. Rest and quiet environment. Relax and reduce stress. Breathe2Relax is a free app that can instruct you on    some simple relaxtion and breathing techniques. Http://Dawnbuse.com is a    free website that provides teaching videos on relaxation.  Also, there are  many apps that   can be downloaded for "mindful" relaxation.  An app called YOGA NIDRA will help walk you through mindfulness. Another app called Calm can be downloaded to give you a structured mindfulness guide with daily reminders and skill development. Headspace for guided meditation Mindfulness Based Stress Reduction Online Course: www.palousemindfulness.com Cold compresses.   5. Don't wait!! Take the maximum allowable dosage of prescribed medication at the first sign of migraine.   6. Compliance:  Take prescribed medication regularly as directed and at the first sign of a migraine.   7. Communicate:  Call your physician when problems arise, especially if your headaches change, increase in frequency/severity, or become associated with neurological symptoms (weakness, numbness, slurred speech, etc.). Proceed to emergency room if you experience new or worsening symptoms or symptoms do not resolve, if you have new  neurologic symptoms or if headache is severe, or for any concerning symptom.   8. Headache/pain management therapies: Consider various complementary methods, including medication, behavioral therapy, psychological counselling, biofeedback, massage therapy, acupuncture, dry needling, and other modalities.  Such measures may reduce the need for medications. Counseling for pain management, where patients learn to function and ignore/minimize their pain, seems to work very well.   9. Recommend changing family's attention and focus away from patient's headaches. Instead, emphasize daily activities. If first question of day is 'How are your headaches/Do you have a headache today?', then patient will constantly think about headaches, thus making them worse. Goal is to re-direct attention away from headaches, toward daily activities and other distractions.   10. Helpful Websites: www.AmericanHeadacheSociety.org www.migrainetrust.org www.headaches.org www.migraine.org.uk www.achenet.org  

## 2024-11-11 NOTE — Progress Notes (Deleted)
 PATIENT: Morgan [name] DOB: 28-Dec-1965  REASON FOR VISIT: follow up HISTORY FROM: patient  Virtual Visit via Mychart video Note  I connected with Morgan [name] on 11/11/24 at  9:30 AM EST by Mychart video and verified that I am speaking with the correct person using two identifiers.   I discussed the limitations, risks, security and privacy concerns of performing an evaluation and management service by video and the availability of in person appointments. I also discussed with the patient that there may be a patient responsible charge related to this service. The patient expressed understanding and agreed to proceed.   History of Present Illness:  11/11/24 ALL (Mychart):  Morgan [name] returns for follow up for migraines. She was last seen 05/2024 and reported more headaches on Emgality . We tried to switch to Qulipta  but insurance preferred Amovig. She started Amovig 06/2024. Since,   Gabapentin    06/03/24 ALL:  Morgan [name] is a 58 y.o. female here today for follow up for migraines. She was last seen 11/2023 and doing well on Emgality , divalproex  500/1000 and gabapentin  600/300/600. We decided to wean divalproex  due to tremor and she was able to stop this medication around toward the end of December. She reports doing pretty good until 02/2024. Since headaches have worsened in frequency. She feels Emgality  is not lasting the whole month. She has a week or so where headaches get more frequent. She estimates 8-10 headache days with 6-7 of these being migrainous. She sees black spots before headache, sometimes without headache. Recently, rizatriptan  seems to make headache worse. Tremor has improved. She is drinking at least 60 ounces of water  daily. Mood is good.   No estrogen.   Tried and failed: Emgality  (lost effectiveness), Ajovy  (not effective), gabapentin  (taking now), divalproex  (tremor), valsartan , topiramate , propranolol , sertraline , sumatriptan , rizatriptan , tizanidine   11/14/2023  ALL (Mychart):  Morgan [name] returns for follow up for migraines. She continues Emgality , divalproex  500/100 and gabapentin  600/300/600. Rizatriptan  used for abortive therapy. She continues to do well. She is having about 3-4 headache days a month. Rizatriptan  works well. She does note that Emgality  seems less effective just prior to next injection. She has sharp pains the day of the injection and 2-3 days afterward. She feels this is mild and wishes to monitor for now. She has a mild tremor of right hand at times.   VPA checked with PCP and normal per patient. CBC and chem reviewed in Epic and unremarkable.   11/01/2022 ALL (Mychart):  Morgan [name] returns for follow up for migraines. She continues divalproex  500/1000, gabapentin  600/300/600, Emgality  and rizatriptan . She reports having an increase in headaches after back surgery in 08/2022. She contributes this to pain medications. She may have 1-5 migraines per month. Rizatriptan  works well. She is feeling well and without concerns.   10/26/2021 ALL (Mychart): Morgan [name] returns for migraine follow up. She continues divalproex , gabapentin  and Emgality . Rizatriptan  used for abortive therapy. She reports headaches are fairly well managed. She has had a few more headaches over the past couple of months. She has 3-5 migraines per month. Rizatriptan  works well for abortive therapy. She reports that CBGs have been fluctuating. She is scheduling follow up with PCP and endo to discuss.   10/20/2020 ALL:  Morgan [name] is a 58 y.o. female here today for follow up for migraines. We continues divalproex , gabapentin  and Emgality . She was started on rizatriptan  for abortive therapy at last follow up. She reports that she is doing very well. She has 2-3 headache  days per month on average. Rizatriptan  has worked very well for abortive therapy. She is tolerating medications well with no obvious adverse effects. She had left shoulder replacement in 07/2020 and is recovering well.    History (copied from my note on 04/14/2020)  Morgan [name] is a 58 y.o. female here today for follow up of migraines.  She continues divalproex  and gabapentin  as prescribed.  We switched her to Emgality  in February she reports that headaches have decreased significantly in intensity and frequency.  She had 4 headaches in the month of April.  She continues to follow with orthopedics closely for neck pain.  She has been dissipated in physical therapy and received 1 steroid injection.  She does not feel that either were very beneficial.  She has tried dry needling with no success.   History (copied from my note on 01/14/2020)   Morgan [name] is a 58 y.o. female here today for follow up.She continues divalproex  500mg  and 1000mg  at night, gabapentin  600mg  in the am, 300mg  at lunch and 600mg  at bedtime and Ajovy  monthly. She does feel that headaches are less severe but continue daily. She feels that pounding headaches are much less severe. She is tolerating medications well, however, insurance is requiring that she try Emgality . They will no longer pay for Ajovy . Insurance will cover Emgality . She is willing to switch medications. She continues to have chronic neck pain managed by orthopedics at this time. She is current treating with steroid injections.    History (copied from Dr Sharion note on 05/22/2019)   Interval history: 24 headache days a month. Most are migraines. 15 are severe. Ajovy  may be helping a little, she has had so much going on she doesn't know if that is contributing. She had emergency surgery in April and recent back surgery. She is taking oxycontin  and 325mg  tylenol . She will keep on the Ajovy  and when all the issues are calmed down she will call us  and discuss nerve blocks.    Observations/Objective:  Generalized: Well developed, in no acute distress  Mentation: Alert oriented to time, place, history taking. Follows all commands speech and language fluent   Assessment and  Plan:  58 y.o. year old female  has a past medical history of Allergic rhinitis, Anemia, Anxiety, Barrett's esophagus, Bipolar affective disorder (HCC), CAP (community acquired pneumonia) (07/20/2018), Chronic respiratory failure (HCC), Constipation, chronic, Degenerative joint disease of spine, Depression, Diabetes mellitus without complication (HCC), DM (diabetes mellitus) (HCC), Dry eye, Family history of adverse reaction to anesthesia, Gastroparesis, GERD (gastroesophageal reflux disease), H/O shoulder replacement (08/11/2020), History of bariatric surgery (03/2017), History of hiatal hernia, HLD (hyperlipidemia) (03/12/2013), Hyperlipidemia, Hypertension, Intractable chronic migraine without aura (06/04/2015), Migraine headache, Morbid obesity (HCC), OSA (obstructive sleep apnea), Osteoarthritis, SBO (small bowel obstruction) (HCC) (03/19/2019), Sleep apnea, Unspecified hypothyroidism, Vitamin B 12 deficiency, and Vitamin D  deficiency. here with  No diagnosis found.    Morgan [name] is doing very well, today.  Migraines are well managed on current treatment plan. She will continue gabapentin  600/300/600mg  and Emgality  as prescribed. I will have her decrease dose of divalproex  to 500mg  BID to assess ability to wean this medication. She will reach out with progress report in 6-8 weeks. May continue to wean dose if appropriate.  She will continue rizatriptan  as needed for abortive therapy.  She will continue to focus on healthy lifestyle habits. Follow-up with care team as advised.  She will return to see me in 1 year for migraine follow-up.  She is  aware she may call sooner if needed.  She verbalizes understanding and agreement with this plan.   No orders of the defined types were placed in this encounter.    No orders of the defined types were placed in this encounter.     Follow Up Instructions:  I discussed the assessment and treatment plan with the patient. The patient was provided an opportunity to  ask questions and all were answered. The patient agreed with the plan and demonstrated an understanding of the instructions.   The patient was advised to call back or seek an in-person evaluation if the symptoms worsen or if the condition fails to improve as anticipated.  I provided 15 minutes of non-face-to-face time during this Mychart video encounter. Patient is located at her place of residence, provider is in the office.    Morgan Brummond, NP

## 2024-11-11 NOTE — Progress Notes (Signed)
 PHARMACY - ANTICOAGULATION CONSULT NOTE  Pharmacy Consult for heparin  infusion > Eliquis Indication: acute radial artery occlusion/ischemic L hand  Patient Measurements: Height: 5' 4 (162.6 cm) Weight: 70.9 kg (156 lb 4.9 oz) IBW/kg (Calculated) : 54.7 HEPARIN  DW (KG): 59.9  Vital Signs: Temp: 98.6 F (37 C) (12/01 1046) Temp Source: Oral (12/01 1046) BP: 156/74 (12/01 1124) Pulse Rate: 86 (12/01 1124)  Labs: Recent Labs    11/09/24 0424 11/10/24 0340 11/11/24 0430  HGB 7.2*  --  7.4*  HCT 22.2*  --  23.1*  PLT 323  --  496*  HEPARINUNFRC 0.47 0.32 0.18*  CREATININE 0.43* 0.39* 0.38*    Estimated Creatinine Clearance: 75 mL/min (A) (by C-G formula based on SCr of 0.38 mg/dL (L)).   Assessment: 58 yo F presenting with E. Coli bacteremia 2/2 SBO with perforation. On 11/22 underwent closure of abdomen in OR and repair of left radial artery. Not on AC PTA. Pharmacy consulted for heparin  management.  Received 3 units of platelets 11/22.  Heparin  level is subtherapeutic at 0.18, on 1750 units/hr. Hgb 7.4 plt 496 on CBC today. No s/sx of bleeding or infusion issues. Will increase slightly and defer bolus in the setting of labile Hgb, requiring recent platelet transfusion, and lower goal.  Per discussion with Dr. Lue, okay to transition to po Eliquis today.  No overt bleeding or complications noted.  Goal of Therapy:  Heparin  level 0.3-0.5 units/ml Monitor platelets by anticoagulation protocol: Yes   Plan:  Stop IV heparin   Start Eliquis 5 mg PO BID.  Harlene Barlow, [employer], BCCP Clinical Pharmacist  11/11/2024 12:43 PM   [facility] pharmacy phone numbers are listed on [email or web address]

## 2024-11-11 NOTE — Progress Notes (Addendum)
 Physical Therapy Treatment Patient Details Name: Morgan [name] MRN: [id number] DOB: January 04, 1966 Today's Date: 11/11/2024   History of Present Illness 58 yo F adm 10/29/24 with SBO s/p ex lap with g-tube placement same date. 11/20 and 11/22 repeat ex lap with VAC. VDRF 11/18-11/24. Pressor support with thrombosed radial artery s/p Lt radial artery embolectomy11/22. PMH: SBO, gastric bypass, DM, OSA, HLD, hypotension, bradycardia, Lt TSA    PT Comments  Pt received in recliner, agreeable to therapy session with encouragement, pt limited due to fatigue and c/o nausea, RN notified and to notify MD as pt does not have anti-nausea meds on order. Pt needing up to minA +2 for sit<>stand and close chair follow for safety with a few forward steps with RW support. Standing tolerance limited due to c/o nausea, SpO2 91% and above on 2L O2 [place] with standing and seated activity. Patient will benefit from continued inpatient follow up therapy, <3 hours/day, discussed with supervising PT Dorothyann H as per chart review, pt does not have consistent 24/7 supervision/assist at home from someone able to provide the level of care that she will need.     If plan is discharge home, recommend the following: Assist for transportation;Assistance with cooking/housework;Help with stairs or ramp for entrance;A little help with bathing/dressing/bathroom;A lot of help with walking and/or transfers;Supervision due to cognitive status   Can travel by private vehicle     No  Equipment Recommendations  BSC/3in1;Rollator (4 wheels) (pending progress; may need regular RW)    Recommendations for Other Services       Precautions / Restrictions Precautions Precautions: Fall;Other (comment) Recall of Precautions/Restrictions: Impaired Precaution/Restrictions Comments: Abd VAC, G-tube, PICC Restrictions Weight Bearing Restrictions Per Provider Order: No Other Position/Activity Restrictions: No formal orders from embolectomy      Mobility  Bed Mobility Overal bed mobility: Needs Assistance             General bed mobility comments: pt received up in chair    Transfers Overall transfer level: Needs assistance Equipment used: Rolling walker (2 wheels) Transfers: Sit to/from Stand Sit to Stand: Min assist, +2 physical assistance   Step pivot transfers: Min assist, +2 safety/equipment       General transfer comment: from chair>RW and RW>chair, ~72ft small shuffled steps forward, close chair follow with pt having to sit due to evolving nausea after 31ft.    Ambulation/Gait                   Stairs             Wheelchair Mobility     Tilt Bed    Modified Rankin (Stroke Patients Only)       Balance Overall balance assessment: Needs assistance Sitting-balance support: Feet supported, Single extremity supported, No upper extremity supported Sitting balance-Leahy Scale: Fair Sitting balance - Comments: forward in chair   Standing balance support: Bilateral upper extremity supported Standing balance-Leahy Scale: Poor Standing balance comment: UE support in standing                            Communication Communication Communication: No apparent difficulties  Cognition Arousal: Alert Behavior During Therapy: Flat affect   PT - Cognitive impairments: Initiation, Problem solving, Safety/Judgement, Attention                       PT - Cognition Comments: slow processing and initiation today; nausea limiting her tolerance for standing  and sitting forward in chair Following commands: Impaired Following commands impaired: Follows one step commands with increased time    Cueing Cueing Techniques: Verbal cues, Tactile cues, Gestural cues  Exercises Other Exercises Other Exercises: IS x 2 reps for teachback, pt achieves 200-416mL, PTA encouraged hourly use Other Exercises: seated BLE AROM: ankle pumps, LAQ, hip flexion x10 reps ea Other Exercises: reclined  posture pulling to long sitting sit-ups with RUE assist x3 reps for strengthening    General Comments General comments (skin integrity, edema, etc.): SpO2 WFL on 2L O2 [place], HR WFL; BP 156/74 (97) reclined in chair on LLE      Pertinent Vitals/Pain Pain Assessment Pain Assessment: 0-10 Pain Location: LUE and abdomen Pain Descriptors / Indicators: Sore, Discomfort, Grimacing, Guarding, Operative site guarding Pain Intervention(s): Limited activity within patient's tolerance, Monitored during session, Premedicated before session, Repositioned, Other (comment) (requesting anti-nausea meds)    Home Living                          Prior Function            PT Goals (current goals can now be found in the care plan section) Acute Rehab PT Goals Patient Stated Goal: none stated PT Goal Formulation: Patient unable to participate in goal setting Time For Goal Achievement: 11/18/24 Progress towards PT goals: Progressing toward goals    Frequency    Min 3X/week      PT Plan      Co-evaluation              AM-PAC PT 6 Clicks Mobility   Outcome Measure  Help needed turning from your back to your side while in a flat bed without using bedrails?: A Little Help needed moving from lying on your back to sitting on the side of a flat bed without using bedrails?: A Lot Help needed moving to and from a bed to a chair (including a wheelchair)?: A Lot Help needed standing up from a chair using your arms (e.g., wheelchair or bedside chair)?: A Little Help needed to walk in hospital room?: Total Help needed climbing 3-5 steps with a railing? : Total 6 Click Score: 12    End of Session Equipment Utilized During Treatment: Oxygen  (defer gait belt 2/2 lines, close chair follow for pt safety) Activity Tolerance: Patient tolerated treatment well;Patient limited by fatigue;Treatment limited secondary to medical complications (Comment);Other (comment) (nausea limiting upright  sitting and standing tolerance) Patient left: in chair;with call bell/phone within reach;with chair alarm set;Other (comment) (heels floated, LUE elevated) Nurse Communication: Mobility status;Other (comment) (nausea) PT Visit Diagnosis: Other abnormalities of gait and mobility (R26.89);Muscle weakness (generalized) (M62.81);Difficulty in walking, not elsewhere classified (R26.2)     Time: 8940-8864 PT Time Calculation (min) (ACUTE ONLY): 36 min  Charges:    $Therapeutic Exercise: 8-22 mins $Therapeutic Activity: 8-22 mins PT General Charges $$ ACUTE PT VISIT: 1 Visit                     Iyesha Such P., PTA Acute Rehabilitation Services Secure Chat Preferred 9a-5:30pm Office: [phone]    [facility] 11/11/2024, 12:25 PM

## 2024-11-11 NOTE — Progress Notes (Signed)
 Progress Note  9 Days Post-Op  Subjective: No complaints this morning.  Tolerating full liquid diet, at goal rate tube feeds and these have been transitioned to cyclical.  Objective: Vital signs in last 24 hours: Temp:  [97.9 F (36.6 C)-98.4 F (36.9 C)] 98.4 F (36.9 C) (12/01 0715) Pulse Rate:  [80-97] 97 (12/01 0421) Resp:  [13-20] 13 (12/01 0715) BP: (124-140)/(52-69) 138/68 (12/01 0715) SpO2:  [94 %-97 %] 97 % (12/01 0715) Weight:  [70.9 kg] 70.9 kg (11/30 2312) Last BM Date : 11/08/24  Intake/Output from previous day: 11/30 0701 - 12/01 0700 In: 210 [P.O.:120; NG/GT:90] Out: 2650 [Urine:2600; Drains:50] Intake/Output this shift: Total I/O In: 30 [NG/GT:30] Out: 0   PE: General: NAD Heart: regular, rate, and rhythm.   Lungs: Respiratory effort nonlabored Abd: soft, nondistended, appropriately ttp, G-tube present with sutures in place, midline wound with vac in place good seal   Lab Results:  Recent Labs    11/09/24 0424 11/11/24 0430  WBC 11.2* 13.0*  HGB 7.2* 7.4*  HCT 22.2* 23.1*  PLT 323 496*   BMET Recent Labs    11/10/24 0340 11/11/24 0430  NA 133* 134*  K 4.5 4.9  CL 97* 92*  CO2 30 37*  GLUCOSE 143* 131*  BUN 15 23*  CREATININE 0.39* 0.38*  CALCIUM  7.8* 8.0*   PT/INR No results for input(s): LABPROT, INR in the last 72 hours. CMP     Component Value Date/Time   NA 134 (L) 11/11/2024 0430   NA 142 09/23/2024 1030   K 4.9 11/11/2024 0430   CL 92 (L) 11/11/2024 0430   CO2 37 (H) 11/11/2024 0430   GLUCOSE 131 (H) 11/11/2024 0430   BUN 23 (H) 11/11/2024 0430   BUN 17 09/23/2024 1030   CREATININE 0.38 (L) 11/11/2024 0430   CREATININE 0.82 06/18/2013 1009   CALCIUM  8.0 (L) 11/11/2024 0430   PROT 4.1 (L) 11/04/2024 0500   PROT 6.5 06/28/2024 0842   ALBUMIN  <1.5 (L) 11/04/2024 0500   ALBUMIN  4.5 06/28/2024 0842   AST 88 (H) 11/04/2024 0500   ALT 64 (H) 11/04/2024 0500   ALKPHOS 48 11/04/2024 0500   BILITOT 0.8 11/04/2024  0500   BILITOT 0.3 06/28/2024 0842   GFRNONAA >60 11/11/2024 0430   GFRNONAA 87 06/18/2013 1009   GFRAA >60 08/04/2020 1038   GFRAA >89 06/18/2013 1009   Lipase     Component Value Date/Time   LIPASE 22 11/01/2024 0450       Studies/Results: No results found.  Anti-infectives: Anti-infectives (From admission, onward)    Start     Dose/Rate Route Frequency Ordered Stop   10/31/24 1400  piperacillin -tazobactam (ZOSYN ) IVPB 3.375 g        3.375 g 12.5 mL/hr over 240 Minutes Intravenous Every 8 hours 10/31/24 0751 11/08/24 0029   10/31/24 1100  micafungin  (MYCAMINE ) 100 mg in sodium chloride  0.9 % 100 mL IVPB        100 mg 105 mL/hr over 1 Hours Intravenous Every 24 hours 10/31/24 0901 11/04/24 1354   10/30/24 0600  piperacillin -tazobactam (ZOSYN ) IVPB 3.375 g  Status:  Discontinued        3.375 g 12.5 mL/hr over 240 Minutes Intravenous Every 8 hours 10/29/24 2350 10/31/24 0751   10/29/24 2145  piperacillin -tazobactam (ZOSYN ) IVPB 3.375 g        3.375 g 100 mL/hr over 30 Minutes Intravenous  Once 10/29/24 2137 10/29/24 2205   10/29/24 1700  cefTRIAXone  (ROCEPHIN ) 1 g  in sodium chloride  0.9 % 100 mL IVPB  Status:  Discontinued        1 g 200 mL/hr over 30 Minutes Intravenous  Once 10/29/24 1646 10/29/24 1656   10/29/24 1700  metroNIDAZOLE  (FLAGYL ) IVPB 500 mg        500 mg 100 mL/hr over 60 Minutes Intravenous  Once 10/29/24 1646 10/29/24 1809   10/29/24 1700  cefTRIAXone  (ROCEPHIN ) 2 g in sodium chloride  0.9 % 100 mL IVPB        2 g 200 mL/hr over 30 Minutes Intravenous  Once 10/29/24 1656 10/29/24 1733        Assessment/Plan POD 13, s/p ex lap with LOA, g-tube placement in gastric remnant Dr. Vernetta 11/18 secondary to SBO with possible perforation POD 11, s/p ex lap with washout, Dr. Tanda 11/20 POD 9, s/p washout, abdominal closure Dr. Tanda 11/22 - continue VAC T/F - Continue advancing diet as tolerated and as approved by SLP, currently cleared for full  liquids.  Will have SLP resee her today for evaluation and hopefully can advance past FLD. - Cyclical tube feeds overnight to help with mobilization/therapies.  Can hopefully stop TFs as diet is advanced and she is eating enough.    FEN - IVFs per CCM, TF to 55mL/hr (goal), cyclical. Prosource/supplements per RD recs.  FLD per SLP.  VTE - heparin  gtt ID - micafungin  5 doses completed; Zosyn  11/20>> 11/28   - per CCM -  E coli bacteremia -completed 1 week of Zosyn  Septic shock -resolved CAD/Heart failure - EF 20-25% on echo 11/21 AKI - resolved DM - SSI Leukopenia - resolved, secondary to sepsis Thrombocytopenia - resolved Hypocalcemia - per CCM Neck wound - unclear etiology, have asked nursing to place mepilex over this to protect it.  Avoid dressings or lines in this area. L hand ischemic changes -  VVS following, s/p embolectomy radial artery and repair, likely will need more surgery. Hand surgery consulted   DISPO- CIR declined, family working towards SNF.    LOS: 13 days   Burnard FORBES Banter, [facility] 11/11/2024, 10:36 AM Please see Amion for pager number during day hours 7:00am-4:30pm

## 2024-11-11 NOTE — Plan of Care (Signed)
  Problem: Education: Goal: Knowledge of General Education information will improve Description: Including pain rating scale, medication(s)/side effects and non-pharmacologic comfort measures Outcome: Progressing   Problem: Clinical Measurements: Goal: Ability to maintain clinical measurements within normal limits will improve Outcome: Progressing Goal: Diagnostic test results will improve Outcome: Progressing Goal: Cardiovascular complication will be avoided Outcome: Progressing   Problem: Activity: Goal: Risk for activity intolerance will decrease Outcome: Progressing   Problem: Nutrition: Goal: Adequate nutrition will be maintained Outcome: Progressing   Problem: Pain Managment: Goal: General experience of comfort will improve and/or be controlled Outcome: Progressing

## 2024-11-11 NOTE — Care Management Important Message (Signed)
 Important Message  Patient Details  Name: Morgan [name] MRN: [id number] Date of Birth: 07-13-1966   Important Message Given:  Yes - Medicare IM     Claretta Deed 11/11/2024, 12:44 PM

## 2024-11-11 NOTE — Progress Notes (Signed)
 PHARMACY - ANTICOAGULATION CONSULT NOTE  Pharmacy Consult for heparin  infusion Indication: acute radial artery occlusion/ischemic L hand  Patient Measurements: Height: 5' 4 (162.6 cm) Weight: 70.9 kg (156 lb 4.9 oz) IBW/kg (Calculated) : 54.7 HEPARIN  DW (KG): 59.9  Vital Signs: Temp: 98.4 F (36.9 C) (12/01 0715) Temp Source: Oral (12/01 0715) BP: 138/68 (12/01 0715) Pulse Rate: 97 (12/01 0421)  Labs: Recent Labs    11/09/24 0424 11/10/24 0340 11/11/24 0430  HGB 7.2*  --  7.4*  HCT 22.2*  --  23.1*  PLT 323  --  496*  HEPARINUNFRC 0.47 0.32 0.18*  CREATININE 0.43* 0.39*  --     Estimated Creatinine Clearance: 75 mL/min (A) (by C-G formula based on SCr of 0.39 mg/dL (L)).   Assessment: 58 yo F presenting with E. Coli bacteremia 2/2 SBO with perforation. On 11/22 underwent closure of abdomen in OR and repair of left radial artery. Not on AC PTA. Pharmacy consulted for heparin  management.  Received 3 units of platelets 11/22.  Heparin  level is subtherapeutic at 0.18, on 1750 units/hr. Hgb 7.4 plt 496 on CBC today. No s/sx of bleeding or infusion issues. Will increase slightly and defer bolus in the setting of labile Hgb, requiring recent platelet transfusion, and lower goal.  Goal of Therapy:  Heparin  level 0.3-0.5 units/ml Monitor platelets by anticoagulation protocol: Yes   Plan:  Increase IV heparin  to 1950 units/hr Check heparin  level in 6 hours Monitor daily heparin  level, CBC, and for s/sx of bleeding   Thank you for allowing pharmacy to participate in this patient's care,  Elma Fail, PharmD PGY1 Clinical Pharmacist [facility]  11/11/2024 8:59 AM

## 2024-11-11 NOTE — Progress Notes (Addendum)
 PROGRESS NOTE    Morgan [name]  FMW:[id number] DOB: 15-Oct-1966 DOA: 10/29/2024 PCP: [name], DO   Brief Narrative:  Morgan [name] is a 58 year old female with history of Roux-en-Y gastric bypass who presented to the emergency department with abdominal pain, lactic acidosis and lipase greater than 2800.  She had CCS evaluation and went for exploratory laparotomy.  Found to have dense adhesive band that was obstructing her proximal small bowel as well as biliary leak without evidence of perforation.  She had a gastrostomy tube placed with a wound VAC.  Initially on Levophed , vasopressin , intubated on bicarb drip postoperatively.  Patient's septic shock continue to improve, off pressors, NG tube remains in place, postoperative care per general surgery including diet advancement.  Patient to complete antibiotics on 11/27, completed antifungals 11/25. Left thumb ischemia status post thrombectomy appears to be stable, vascular surgery following. Patient's chronic comorbid conditions otherwise appear to be improving if not stable.  Assessment & Plan:   Principal Problem:   SBO (small bowel obstruction) s/p lap LOA 03/19/2019 Active Problems:   Malnutrition of moderate degree   Protein-calorie malnutrition, severe  Septic shock due to perforated SBO with peritonitis, E Coli bacteremia, resolved SBO due to adhesion Status post ex lap with lysis of adhesions 11/18; washouts on 11/20 and abdominal closure on 11/22 -wound VAC replacement/exchange 11/28 G-tube in gastric remnant History of Roux-en-Y bariatric surgery - TPN off as of 11/27, tube feeds ongoing with advancing diet, advanced per general surgery - zosyn  completed for ecoli bactermia; micafungin  completed 11/25 for peritonitis    Acute HFrEF- concerning given CAD hx vs septic cardiomyopathy, improving - Off dobutamine , avoid pressors given above - GDMT on hold due to hypotension per heart failure team -start low dose Lasix  -  Cardiac cath pending further recovery   Critical left hand ischemia due to thrombosed radial artery, s/p thrombectomy - Left radial arterial line discontinued 11/20, status post radial artery embolectomy with vascular surgery who notes left thumb necrosis distal to MP joint, defer to hand surgery will allow this to declare then plan for amputation (suspected to take 3-4 weeks). - Transition to p.o. DOAC   Urinary retention, improving Acute kidney injury, resolved - Foley previously removed with noted difficulty voiding over the past 24h - plan to replace foley catheter later today if still unable to void/requiring repeated in/out cath - Strict I/O -renally dose meds, avoid nephrotoxic meds  Thrombocytopenia due to sepsis, resolved - no need for platelet transfusion; monitor   Acute anemia, recovering - Presumed blood loss vs worsening chronic anemia of chronic disease/aplastic anemia - Hemoglobin improved to 8.6 after transfusion, 1 unit 11/25   Acute hypoxic respiratory failure postoperatively RLL PNA- suspect aspiration from SBO - extubated successfully 11/24 - wean O2 for sat >92%  - IS and mobilize as able   Hypoglycemia  H/o DM2 but  A1c 5.3 and not on meds PTA-- resolved after Roux-en-Y? -monitor; not needing insulin  - Stable after steroid cessation   Hypothyroidism - con't synthroid    Elevated transaminases, hyperbilirubinemia - stabilizing  - bilirubin back to normal    Depression/Anxiety  - Increase sertraline  back to home dose  Pressure injury noted, POA Wound 10/31/24 1716 Pressure Injury Coccyx Medial Deep Tissue Pressure Injury - Purple or maroon localized area of discolored intact skin or blood-filled blister due to damage of underlying soft tissue from pressure and/or shear. (Active)   DVT prophylaxis: SCD's Start: 10/29/24 2346 transition to Eliquis  Code Status:  Code Status: Full Code Family Communication: None present  Status is: Inpatient  Dispo:  The patient is from: Home              Anticipated d/c is to: To be determined              Anticipated d/c date is: To be determined              Patient currently not medically stable for discharge  Consultants:  PCCM, general surgery, vascular surgery  Antimicrobials:  Micafungin  completed Zosyn  completed  Subjective:   Objective: Vitals:   11/10/24 1547 11/10/24 1917 11/10/24 2312 11/11/24 0421  BP: (!) 134/54 124/65 133/62 (!) 140/69  Pulse: 80 80 90 97  Resp: 20 16 18 17   Temp: 98.2 F (36.8 C) 98.3 F (36.8 C) 98 F (36.7 C) 97.9 F (36.6 C)  TempSrc: Oral Oral Oral Oral  SpO2: 97% 94% 96% 97%  Weight:   70.9 kg   Height:        Intake/Output Summary (Last 24 hours) at 11/11/2024 0701 Last data filed at 11/11/2024 0439 Gross per 24 hour  Intake 210 ml  Output 2650 ml  Net -2440 ml   Filed Weights   11/05/24 0700 11/08/24 0451 11/10/24 2312  Weight: 73 kg 71.8 kg 70.9 kg    Examination:  General:  Pleasantly resting in bed, No acute distress. HEENT:  Normocephalic atraumatic.  Sclerae nonicteric, noninjected.  Extraocular movements intact bilaterally. Neck:  Without mass or deformity.  Trachea is midline. Lungs: Diminished without wheeze rales or rhonchi. Heart:  Regular rate and rhythm.  Without murmurs, rubs, or gallops. Abdomen: Nontender, G-tube noted, postoperative bandage clean dry intact Extremities: Without cyanosis, clubbing, edema, or obvious deformity.  Right upper extremity PICC line noted Skin:  Warm and dry, no erythema. Wound 10/31/24 1716 Pressure Injury Coccyx Medial Deep Tissue Pressure Injury - Purple or maroon localized area of discolored intact skin or blood-filled blister due to damage of underlying soft tissue from pressure and/or shear. (Active)    Data Reviewed: I have personally reviewed following labs and imaging studies  CBC: Recent Labs  Lab 11/06/24 0436 11/07/24 0440 11/08/24 0501 11/09/24 0424 11/11/24 0430  WBC  11.3* 11.3* 10.5 11.2* 13.0*  HGB 8.6* 7.9* 7.6* 7.2* 7.4*  HCT 26.8* 23.9* 23.0* 22.2* 23.1*  MCV 89.0 87.9 87.8 88.4 90.2  PLT 199 246 282 323 496*   Basic Metabolic Panel: Recent Labs  Lab 11/05/24 0337 11/05/24 1055 11/05/24 1807 11/06/24 0436 11/06/24 1700 11/07/24 0440 11/08/24 0501 11/09/24 0424 11/10/24 0340 11/11/24 0430  NA  --    < >  --  137  --  136 136 132* 133*  --   K  --    < >  --  3.6  --  4.2 4.1 4.2 4.5  --   CL  --    < >  --  97*  --  97* 98 95* 97*  --   CO2  --    < >  --  33*  --  32 28 30 30   --   GLUCOSE  --    < >  --  116*  --  120* 129* 126* 143*  --   BUN  --    < >  --  17  --  18 14 14 15   --   CREATININE  --    < >  --  0.46  --  0.47  0.37* 0.43* 0.39*  --   CALCIUM   --    < >  --  7.3*  --  7.4* 7.5* 7.4* 7.8*  --   MG 2.0  --  1.8 2.0 1.6*  --  1.7 2.1  --  1.7  PHOS 2.2*  --  3.0 3.5 2.9  --   --   --   --  3.9   < > = values in this interval not displayed.   GFR: Estimated Creatinine Clearance: 75 mL/min (A) (by C-G formula based on SCr of 0.39 mg/dL (L)). Liver Function Tests: No results for input(s): AST, ALT, ALKPHOS, BILITOT, PROT, ALBUMIN  in the last 168 hours.  No results for input(s): LIPASE, AMYLASE in the last 168 hours.  No results for input(s): AMMONIA in the last 168 hours. Coagulation Profile: No results for input(s): INR, PROTIME in the last 168 hours.  Cardiac Enzymes: No results for input(s): CKTOTAL, CKMB, CKMBINDEX, TROPONINI in the last 168 hours. BNP (last 3 results) Recent Labs    11/01/24 0920  PROBNP >35,000.0*   HbA1C: No results for input(s): HGBA1C in the last 72 hours. CBG: Recent Labs  Lab 11/10/24 1105 11/10/24 1549 11/10/24 1915 11/10/24 2310 11/11/24 0423  GLUCAP 92 133* 92 147* 119*   Lipid Profile: No results for input(s): CHOL, HDL, LDLCALC, TRIG, CHOLHDL, LDLDIRECT in the last 72 hours.  Thyroid  Function Tests: No results for input(s):  TSH, T4TOTAL, FREET4, T3FREE, THYROIDAB in the last 72 hours. Anemia Panel: No results for input(s): VITAMINB12, FOLATE, FERRITIN, TIBC, IRON , RETICCTPCT in the last 72 hours. Sepsis Labs: No results for input(s): PROCALCITON, LATICACIDVEN in the last 168 hours.   Recent Results (from the past 240 hours)  MRSA Next Gen by PCR, Nasal     Status: None   Collection Time: 11/02/24  8:54 AM   Specimen: Nasal Mucosa; Nasal Swab  Result Value Ref Range Status   MRSA by PCR Next Gen NOT DETECTED NOT DETECTED Final    Comment: (NOTE) The GeneXpert MRSA Assay (FDA approved for NASAL specimens only), is one component of a comprehensive MRSA colonization surveillance program. It is not intended to diagnose MRSA infection nor to guide or monitor treatment for MRSA infections. Test performance is not FDA approved in patients less than 87 years old. Performed at [facility], [address], [place], [place] [address]          Radiology Studies: No results found.      Scheduled Meds:  acetaminophen   650 mg Per Tube Q6H   ascorbic acid  500 mg Oral BID   Chlorhexidine  Gluconate Cloth  6 each Topical Daily   copper   2 mg Oral Daily   feeding supplement (OSMOLITE 1.5 CAL)  1,200 mL Per Tube Q24H   feeding supplement (OSMOLITE 1.5 CAL)  1,440 mL Per Tube Q24H   feeding supplement (PROSource TF20)  60 mL Per Tube BID   free water   30 mL Per Tube Q4H   furosemide   40 mg Per Tube Daily   insulin  aspart  0-15 Units Subcutaneous Q4H   levothyroxine   112 mcg Per Tube QAC breakfast   multivitamin with minerals  1 tablet Oral Daily   nutrition supplement (JUVEN)  1 packet Per Tube BID BM   mouth rinse  15 mL Mouth Rinse 4 times per day   pantoprazole  (PROTONIX ) IV  40 mg Intravenous Q12H   polyethylene glycol  17 g Oral BID   sertraline   200  mg Per Tube Daily   spironolactone   12.5 mg Per Tube Daily   vitamin A   10,000 Units Oral Daily   zinc  sulfate (50mg   elemental zinc )  220 mg Per Tube Daily   Continuous Infusions:  heparin  1,750 Units/hr (11/10/24 2011)     LOS: 13 days   Time spent:  Elsie JAYSON Montclair, DO [facility]  If 7PM-7AM, please contact night-coverage [email or web address]  11/11/2024, 7:01 AM

## 2024-11-11 NOTE — TOC Progression Note (Addendum)
 Transition of Care [facility]) - Progression Note    Patient Details  Name: Morgan [name] MRN: [id number] Date of Birth: [birth year]  [facility] CM/SW Contact  Arlana JINNY Nicholaus ISRAEL Phone Number: [phone] 11/11/2024, 10:15 AM  Clinical Narrative:   Current accepted SNF bed list:                   Skilled Nursing Rehab Facilities-   [email or web address]     Ratings out of 5 stars (5 the highest)    Name Address  Phone # Quality Care Staffing Health Inspection Overall  [facility] [address] [place], [place] [phone] 1 2 2   [facility] ([facility] [address] [place] [phone] 1 3 3 2   [place] [facility] [address], [place] [phone] 2 1 1 1   [facility] [address] [place] [phone] 4 1 3 2   [facility]  [address] [place] [address]   [phone]        [facility] [address] [place] [address]   [phone]         HF CSW called and spoke with Harlene [facility], niece) to share updated accepted bed list offers. CSW sent an emailed bed list to her at [email or web address] for review. Harlene stated that she will follow up with SNF choice soon. HF CSW also left a list at bedside.   HF CSW/CM will continue to follow and monitor for dc readiness.    Expected Discharge Plan: Home/Self Care Barriers to Discharge: Continued Medical Work up               Expected Discharge Plan and Services In-house Referral:  (rw) Discharge Planning Services: CM Consult   Living arrangements for the past 2 months: Single Family Home                                       Social Drivers of Health (SDOH) Interventions SDOH Screenings   Food Insecurity: Patient Unable To Answer (11/02/2024)  Housing: Patient Unable To Answer (11/02/2024)  Transportation Needs: Patient Unable To Answer (11/02/2024)  Utilities: Patient Unable To Answer  (11/02/2024)  Alcohol  Screen: Low Risk  (09/23/2024)  Depression (PHQ2-9): Medium Risk (06/28/2024)  Financial Resource Strain: Medium Risk (09/23/2024)  Physical Activity: Insufficiently Active (09/23/2024)  Social Connections: Patient Unable To Answer (11/02/2024)  Recent Concern: Social Connections - Moderately Isolated (09/23/2024)  Stress: Stress Concern Present (09/23/2024)  Tobacco Use: Low Risk  (10/29/2024)  Health Literacy: Adequate Health Literacy (09/23/2024)    Readmission Risk Interventions     No data to display

## 2024-11-12 LAB — BPAM RBC
Blood Product Expiration Date: 202512272359
ISSUE DATE / TIME: 202512012117
Unit Type and Rh: 8400

## 2024-11-12 LAB — BASIC METABOLIC PANEL WITH GFR
Anion gap: 6 (ref 5–15)
BUN: 27 mg/dL — ABNORMAL HIGH (ref 6–20)
CO2: 36 mmol/L — ABNORMAL HIGH (ref 22–32)
Calcium: 8.1 mg/dL — ABNORMAL LOW (ref 8.9–10.3)
Chloride: 93 mmol/L — ABNORMAL LOW (ref 98–111)
Creatinine, Ser: 0.43 mg/dL — ABNORMAL LOW (ref 0.44–1.00)
GFR, Estimated: >60 mL/min (ref >60)
Glucose, Bld: 203 mg/dL — ABNORMAL HIGH (ref 70–99)
Potassium: 5 mmol/L (ref 3.5–5.1)
Sodium: 135 mmol/L (ref 135–145)

## 2024-11-12 LAB — TYPE AND SCREEN
ABO/RH(D): AB POS
Antibody Screen: NEGATIVE
Unit division: 0

## 2024-11-12 LAB — CBC
HCT: 25.9 % — ABNORMAL LOW (ref 36.0–46.0)
Hemoglobin: 8.1 g/dL — ABNORMAL LOW (ref 12.0–15.0)
MCH: 27.9 pg (ref 26.0–34.0)
MCHC: 31.3 g/dL (ref 30.0–36.0)
MCV: 89.3 fL (ref 80.0–100.0)
Platelets: 601 K/uL — ABNORMAL HIGH (ref 150–400)
RBC: 2.9 MIL/uL — ABNORMAL LOW (ref 3.87–5.11)
RDW: 16.6 % — ABNORMAL HIGH (ref 11.5–15.5)
WBC: 11.3 K/uL — ABNORMAL HIGH (ref 4.0–10.5)
nRBC: 0 % (ref 0.0–0.2)

## 2024-11-12 LAB — GLUCOSE, CAPILLARY
Glucose-Capillary: 168 mg/dL — ABNORMAL HIGH (ref 70–99)
Glucose-Capillary: 152 mg/dL — ABNORMAL HIGH (ref 70–99)
Glucose-Capillary: 100 mg/dL — ABNORMAL HIGH (ref 70–99)
Glucose-Capillary: 83 mg/dL (ref 70–99)
Glucose-Capillary: 95 mg/dL (ref 70–99)
Glucose-Capillary: 163 mg/dL — ABNORMAL HIGH (ref 70–99)

## 2024-11-12 LAB — C-REACTIVE PROTEIN: CRP: 35.5 mg/dL — ABNORMAL HIGH (ref <1.0)

## 2024-11-12 MED ORDER — METOPROLOL SUCCINATE ER 25 MG PO TB24
12.5000 mg | ORAL_TABLET | Freq: Every day | ORAL | Status: DC
  Administered 2024-11-12 – 2024-11-17 (×6): 12.5 mg via ORAL
  Filled 2024-11-12 (×7): qty 1

## 2024-11-12 MED ORDER — EZETIMIBE 10 MG PO TABS
10.0000 mg | ORAL_TABLET | Freq: Every day | ORAL | Status: DC
  Administered 2024-11-12 – 2024-11-18 (×7): 10 mg via ORAL
  Filled 2024-11-12 (×7): qty 1

## 2024-11-12 NOTE — Progress Notes (Signed)
 Pt arrived to 6 north room 9 alert and oriented x4. Pain level 5/10. Pictures of wounds taken on arrival to unit. Bed in lowest position. Call light in reach. Nephew at bedside. All needs met at this time.

## 2024-11-12 NOTE — Consult Note (Addendum)
 WOC Nurse Consult follow-up Note: Reason for Consult: Vac dressing changed to midline abd; 12X5X.3cm, full thickness post-op wound.  Pt was medicated for pain prior to the procedure and tolerated with mod amt discomfort.  Wound is approx 80% pale red with 15% yellow, 5% brown at the top of the wound.  Surgical PA in to assess wound appearance.  Small amt pink drainage in the cannister. Applied one piece black foam to cont suction.     WOC team will plan to change again on Fri.  Wound is fairly shallow and Vac might not be needed much longer.   Refer to previous WOC notes on 11/25; Pt was previously noted to have a deep tissue pressure injury to the sacrum.  This is evolving into 50% Stage 3 pressure injury with Unstageable 50% loose slough to the edges. 3.5X2.5X.2cm.  Mod amt yellow drainage. Continue present plan of care with Xeroform gauze.  WOC team will reassess Q 7-10 days to determine if a change in the plan of care is indicated at that time.     Thank-you,  Stephane Fought MSN, RN, CWOCN, CWCN-AP, CNS Contact Mon-Fri 0700-1500: [phone]              Revision History

## 2024-11-12 NOTE — Progress Notes (Signed)
 Patient seen and examined at bedside.  Finger continues to demarcate.  I think that the distal tip will likely not be salvageable, but the rest of the thumb appears to be doing quite nicely.  She has a palpable radial pulse.  Staple line intact.  Plan for staple removal likely on 12/10.  Please call if patient is discharged prior.  Luismanuel Corman E [place] Mc[place]ough

## 2024-11-12 NOTE — Progress Notes (Signed)
 Mobility Specialist Progress Note:    11/12/24 1000  Mobility  Activity Pivoted/transferred from bed to chair  Level of Assistance Minimal assist, patient does 75% or more  Assistive Device Front wheel walker  Distance Ambulated (ft) 3 ft  Activity Response Tolerated well  Mobility Referral Yes  Mobility visit 1 Mobility  Mobility Specialist Start Time (ACUTE ONLY) 1020  Mobility Specialist Stop Time (ACUTE ONLY) 1031  Mobility Specialist Time Calculation (min) (ACUTE ONLY) 11 min   Pt received in bed agreeable to mobility. C/o mild nausea throughout session otherwise tolerated well. Required MinA for bed mobility and STS. Was able to take a few steps towards the chair w/o fault. Distance limited d/t only having one person to assist. Left in chair w/ call bell and personal belongings in reach. All needs met.  Thersia Minder Mobility Specialist  Please contact vis Secure Chat or  Rehab Office [phone]

## 2024-11-12 NOTE — Progress Notes (Signed)
 Advanced Heart Failure Rounding Note  Patient Profile:    58 y/o female admitted w/ SBO and septic shock d/t e.coli bacteremia, s/p exploratory laparotomy w/ lysis of adhesions and placement of gastrostomy tube. Post op course c/b new systolic heart failure w/ CS and acute radial artery occlusion/ischemic left hand requiring radial artery embolectomy.   Subjective:    11/22: OR after ab wound closure and revasc of LUE  12/1: Echo  with EF 60-65%, RV ok  Sitting up in chair. Feeling ok. Has been working with PT, appetite is improving. No SOB or CP. Had BM this am.  Objective:    Vital Signs:   Temp:  [97 F (36.1 C)-98.7 F (37.1 C)] 97.7 F (36.5 C) (12/02 0400) Pulse Rate:  [84-90] 87 (12/02 0453) Resp:  [10-20] 15 (12/02 0453) BP: (90-156)/(48-78) 133/53 (12/02 0400) SpO2:  [91 %-98 %] 97 % (12/02 0453) Weight:  [67.5 kg] 67.5 kg (12/02 0453) Last BM Date : 11/08/24  Weight change: Filed Weights   11/08/24 0451 11/10/24 2312 11/12/24 0453  Weight: 71.8 kg 70.9 kg 67.5 kg   Intake/Output:  Intake/Output Summary (Last 24 hours) at 11/12/2024 0741 Last data filed at 11/12/2024 0602 Gross per 24 hour  Intake 4688.56 ml  Output 850 ml  Net 3838.56 ml    Physical Exam: General: Haggard appearing. No distress  Cardiac: JVP flat. No murmurs  Extremities: Warm and dry.  No edema.  Neuro: A&O x3. Affect pleasant.   Telemetry: SR in 80s (personally reviewed)  Labs: Basic Metabolic Panel: Recent Labs  Lab 11/05/24 1807 11/06/24 0436 11/06/24 1700 11/07/24 0440 11/08/24 0501 11/09/24 0424 11/10/24 0340 11/11/24 0430 11/12/24 0405  NA  --  137  --    < > 136 132* 133* 134* 135  K  --  3.6  --    < > 4.1 4.2 4.5 4.9 5.0  CL  --  97*  --    < > 98 95* 97* 92* 93*  CO2  --  33*  --    < > 28 30 30  37* 36*  GLUCOSE  --  116*  --    < > 129* 126* 143* 131* 203*  BUN  --  17  --    < > 14 14 15  23* 27*  CREATININE  --  0.46  --    < > 0.37* 0.43* 0.39* 0.38* 0.43*   CALCIUM   --  7.3*  --    < > 7.5* 7.4* 7.8* 8.0* 8.1*  MG 1.8 2.0 1.6*  --  1.7 2.1  --  1.7  --   PHOS 3.0 3.5 2.9  --   --   --   --  3.9  --    < > = values in this interval not displayed.   CBC: Recent Labs  Lab 11/07/24 0440 11/08/24 0501 11/09/24 0424 11/11/24 0430 11/12/24 0407  WBC 11.3* 10.5 11.2* 13.0* 11.3*  HGB 7.9* 7.6* 7.2* 7.4* 8.1*  HCT 23.9* 23.0* 22.2* 23.1* 25.9*  MCV 87.9 87.8 88.4 90.2 89.3  PLT 246 282 323 496* 601*   BNP (last 3 results) No results for input(s): BNP in the last 8760 hours.  ProBNP (last 3 results) Recent Labs    11/01/24 0920  PROBNP >35,000.0*    Medications:    Scheduled Medications:  acetaminophen   650 mg Per Tube Q6H   apixaban  5 mg Oral BID   ascorbic acid  500 mg Oral BID  Chlorhexidine  Gluconate Cloth  6 each Topical Daily   copper   2 mg Oral Daily   feeding supplement  237 mL Oral BID BM   feeding supplement (OSMOLITE 1.5 CAL)  1,440 mL Per Tube Q24H   feeding supplement (PROSource TF20)  60 mL Per Tube BID   free water   30 mL Per Tube Q4H   furosemide   40 mg Per Tube Daily   insulin  aspart  0-15 Units Subcutaneous Q4H   levothyroxine   112 mcg Per Tube QAC breakfast   losartan  25 mg Oral Daily   multivitamin with minerals  1 tablet Oral Daily   mouth rinse  15 mL Mouth Rinse 4 times per day   pantoprazole  (PROTONIX ) IV  40 mg Intravenous Q12H   polyethylene glycol  17 g Oral BID   sertraline   200 mg Per Tube Daily   vitamin A   10,000 Units Oral Daily   zinc  sulfate (50mg  elemental zinc )  220 mg Per Tube Daily    Infusions:    PRN Medications: lip balm, ondansetron  (ZOFRAN ) IV, ondansetron , mouth rinse, oxyCODONE , sodium chloride  flush  Assessment:   1. Septic shock with e.coli bacteremia +/-  Cardiogenic shock, Shock resolved  - Patient was admitted with small bowel obstruction and septic shock. Patient underwent exploratory laparotomy exploratory laparotomy was found to have a densities at the end  obstructing the proximal small bowel near Roux-en-Y anastomosis. S/p abdominal closure 11/22.   - Bcx + for e.coli. Completed abx and stress-dose steroids. - Suspect stress CM. Off inotrope  2. Acute HFrEF>HFimpEF - Echo 11/20 EF of 20-25% with multiple wall motion abnormalities  - POCUS ECHO 11/22 EF ~30-35% with RWMA concerning for underlying CAD - Echo 12/1: EF improved. 60-65%, RV ok - stress induced CM - co-ox stable off support - continue lasix  40 mg daily  - off spiro with hyperkalemia - continue losartan 25 mg daily - start toprolXL 12.5 mg daily at bedtime.  3. Acute hypoxic resp failure - extubated  - stable on   4. Non-Obstructive CAD - Coronary CTA in 09/2024 showed coronary calcium  score of 315 (90th percentile for age and sex) and mild mixed nonobstructive CAD with 1-24% stenosis of the proximal RCA and 25-49% stenosis of LAD. - Off aspirin  with severe thrombocytopenia this admit  - Intolerant to statins in the past. Did not tolerate rapatha d/t myalgias - continue zetia  10 mg daily   5. Ischemic Left Hand - VVS following. S/p revasc 11/22  - On eliquis  6. Severe thrombocytopenia - resolved    7. Anemia: - Hgb slowly drifting down after transfusion, 8.6>7.6>7.4>1u RBC>8.1 - stable  Heart failure team will sign off as of 11/12/24  HF Team Medication Recommendations for Home: - eliquis 5 mg bid - lasix  40 mg daily - losartan 25 mg daily - toprol  XL 12.5 mg daily at bedtime  Will make a post hospital cardiology appointment.  Length of Stay: 86  Ovetta Bazzano, NP 11/12/2024, 7:41 AM  Advanced Heart Failure Team Pager [phone] (M-F; 7a - 4p)  Please contact [facility] for night-coverage after hours (4p -7a ) and weekends on [email or web address]

## 2024-11-12 NOTE — Progress Notes (Signed)
 PROGRESS NOTE    Morgan [name]  FMW:[id number] DOB: Dec 23, 1965 DOA: 10/29/2024 PCP: Morgan Norene HERO, DO   Brief Narrative:  Morgan [name] is a 58 year old female with history of Roux-en-Y gastric bypass who presented to the emergency department with abdominal pain, lactic acidosis and lipase greater than 2800.  She had CCS evaluation and went for exploratory laparotomy.  Found to have dense adhesive band that was obstructing her proximal small bowel as well as biliary leak without evidence of perforation.  She had a gastrostomy tube placed with a wound VAC.  Initially on Levophed , vasopressin , intubated on bicarb drip postoperatively.  Patient's septic shock continue to improve, off pressors, NG tube remains in place, postoperative care per general surgery including diet advancement.  Patient to complete antibiotics on 11/27, completed antifungals 11/25. Left thumb ischemia status post thrombectomy appears to be stable, vascular surgery following. Patient's chronic comorbid conditions otherwise appear to be improving if not stable.  Assessment & Plan:   Principal Problem:   SBO (small bowel obstruction) s/p lap LOA 03/19/2019 Active Problems:   Malnutrition of moderate degree   Protein-calorie malnutrition, severe  Septic shock due to perforated SBO with peritonitis, E Coli bacteremia, resolved SBO due to adhesion Status post ex lap with lysis of adhesions 11/18; washouts on 11/20 and abdominal closure on 11/22  -wound VAC ongoing, last bandage exchange 11/12/2024 -may be able to discontinue this week - G-tube in gastric remnant -May be pulled in the next few days per surgery History of Roux-en-Y bariatric surgery - TPN off as of 11/27, tube feeds ongoing with advancing diet, advanced per general surgery - zosyn  completed for ecoli bactermia; micafungin  completed 11/25 for peritonitis    Acute HFrEF- concerning given CAD hx vs septic cardiomyopathy, improving - Off dobutamine  - GDMT  on hold due to hypotension per heart failure team -continue furosemide  - Cardiac cath pending further recovery per cardiology expertise   Critical left hand ischemia due to thrombosed radial artery, s/p thrombectomy - Left radial arterial line discontinued 11/20, status post radial artery embolectomy with vascular surgery  - Allow this to declare then plan for amputation (suspected to take 3-4 weeks) - 12/2 reevaluated by vascular surgery, tip appears to be nonsalvageable but thumb improving - Transition to p.o. DOAC - Staple removal planned 12/10 -notify vascular surgery if patient discharges before this date   Urinary retention, improving Acute kidney injury, resolved - Foley previously removed -currently with external urinary cath while in bed - Strict I/O - Renally dose meds, avoid nephrotoxic meds  Thrombocytopenia due to sepsis, resolved - no need for platelet transfusion; monitor   Acute anemia, recovering - Presumed blood loss vs worsening chronic anemia of chronic disease/aplastic anemia - Hemoglobin improved to 8.6 after transfusion, 1 unit 11/25   Acute hypoxic respiratory failure postoperatively RLL PNA- suspect aspiration from SBO - extubated successfully 11/24 - wean O2 for sat >92%  - IS and mobilize as able   Hypoglycemia  H/o DM2 but  A1c 5.3 and not on meds PTA-- resolved after Roux-en-Y? -monitor; not needing insulin  - Stable after steroid cessation   Hypothyroidism - con't synthroid    Elevated transaminases, hyperbilirubinemia - stabilizing  - bilirubin back to normal    Depression/Anxiety  - Increase sertraline  back to home dose  Pressure injury noted, POA Wound 10/31/24 1716 Pressure Injury Coccyx Medial Deep Tissue Pressure Injury - Purple or maroon localized area of discolored intact skin or blood-filled blister due to damage of underlying soft tissue  from pressure and/or shear. (Active)   DVT prophylaxis: SCD's Start: 10/29/24 2346 transition to  Eliquis Code Status:   Code Status: Full Code Family Communication: None present  Status is: Inpatient  Dispo: The patient is from: Home              Anticipated d/c is to: To be determined              Anticipated d/c date is: To be determined              Patient currently not medically stable for discharge  Consultants:  PCCM, general surgery, vascular surgery  Antimicrobials:  Micafungin  completed Zosyn  completed  Subjective: No acute issues or events overnight, improving daily denies nausea vomiting diarrhea constipation any fevers chills or chest pain.  Still complains of profound weakness with ambulation even with assistance from staff  Objective: Vitals:   11/11/24 2308 11/12/24 0013 11/12/24 0400 11/12/24 0453  BP:  129/66 (!) 133/53   Pulse: 89 84 86 87  Resp: 13 15 15 15   Temp: 98 F (36.7 C) 98.1 F (36.7 C) 97.7 F (36.5 C)   TempSrc: Axillary Axillary Axillary   SpO2: 91% 96% 96% 97%  Weight:    67.5 kg  Height:        Intake/Output Summary (Last 24 hours) at 11/12/2024 0718 Last data filed at 11/12/2024 0602 Gross per 24 hour  Intake 4688.56 ml  Output 850 ml  Net 3838.56 ml   Filed Weights   11/08/24 0451 11/10/24 2312 11/12/24 0453  Weight: 71.8 kg 70.9 kg 67.5 kg    Examination:  General:  Pleasantly resting in bed, No acute distress. HEENT:  Normocephalic atraumatic.  Sclerae nonicteric, noninjected.  Extraocular movements intact bilaterally. Neck:  Without mass or deformity.  Trachea is midline. Lungs: Diminished without wheeze rales or rhonchi. Heart:  Regular rate and rhythm.  Without murmurs, rubs, or gallops. Abdomen: Nontender, G-tube noted, postoperative wound VAC bandage clean dry intact Extremities: Without cyanosis, clubbing, edema, or obvious deformity.  Right upper extremity PICC line noted Skin:  Warm and dry, no erythema. Wound 10/31/24 1716 Pressure Injury Coccyx Medial Deep Tissue Pressure Injury - Purple or maroon localized  area of discolored intact skin or blood-filled blister due to damage of underlying soft tissue from pressure and/or shear. (Active)    Data Reviewed: I have personally reviewed following labs and imaging studies  CBC: Recent Labs  Lab 11/06/24 0436 11/07/24 0440 11/08/24 0501 11/09/24 0424 11/11/24 0430  WBC 11.3* 11.3* 10.5 11.2* 13.0*  HGB 8.6* 7.9* 7.6* 7.2* 7.4*  HCT 26.8* 23.9* 23.0* 22.2* 23.1*  MCV 89.0 87.9 87.8 88.4 90.2  PLT 199 246 282 323 496*   Basic Metabolic Panel: Recent Labs  Lab 11/05/24 1807 11/06/24 0436 11/06/24 1700 11/07/24 0440 11/08/24 0501 11/09/24 0424 11/10/24 0340 11/11/24 0430 11/12/24 0405  NA  --  137  --    < > 136 132* 133* 134* 135  K  --  3.6  --    < > 4.1 4.2 4.5 4.9 5.0  CL  --  97*  --    < > 98 95* 97* 92* 93*  CO2  --  33*  --    < > 28 30 30  37* 36*  GLUCOSE  --  116*  --    < > 129* 126* 143* 131* 203*  BUN  --  17  --    < > 14 14 15  23* 27*  CREATININE  --  0.46  --    < > 0.37* 0.43* 0.39* 0.38* 0.43*  CALCIUM   --  7.3*  --    < > 7.5* 7.4* 7.8* 8.0* 8.1*  MG 1.8 2.0 1.6*  --  1.7 2.1  --  1.7  --   PHOS 3.0 3.5 2.9  --   --   --   --  3.9  --    < > = values in this interval not displayed.   GFR: Estimated Creatinine Clearance: 73.2 mL/min (A) (by C-G formula based on SCr of 0.43 mg/dL (L)). Liver Function Tests: No results for input(s): AST, ALT, ALKPHOS, BILITOT, PROT, ALBUMIN  in the last 168 hours.  No results for input(s): LIPASE, AMYLASE in the last 168 hours.  No results for input(s): AMMONIA in the last 168 hours. Coagulation Profile: No results for input(s): INR, PROTIME in the last 168 hours.  Cardiac Enzymes: No results for input(s): CKTOTAL, CKMB, CKMBINDEX, TROPONINI in the last 168 hours. BNP (last 3 results) Recent Labs    11/01/24 0920  PROBNP >35,000.0*   HbA1C: No results for input(s): HGBA1C in the last 72 hours. CBG: Recent Labs  Lab 11/11/24 1044  11/11/24 1546 11/11/24 1957 11/11/24 2351 11/12/24 0449  GLUCAP 96 106* 117* 181* 168*   Lipid Profile: No results for input(s): CHOL, HDL, LDLCALC, TRIG, CHOLHDL, LDLDIRECT in the last 72 hours.  Thyroid  Function Tests: No results for input(s): TSH, T4TOTAL, FREET4, T3FREE, THYROIDAB in the last 72 hours. Anemia Panel: No results for input(s): VITAMINB12, FOLATE, FERRITIN, TIBC, IRON , RETICCTPCT in the last 72 hours. Sepsis Labs: No results for input(s): PROCALCITON, LATICACIDVEN in the last 168 hours.   Recent Results (from the past 240 hours)  MRSA Next Gen by PCR, Nasal     Status: None   Collection Time: 11/02/24  8:54 AM   Specimen: Nasal Mucosa; Nasal Swab  Result Value Ref Range Status   MRSA by PCR Next Gen NOT DETECTED NOT DETECTED Final    Comment: (NOTE) The GeneXpert MRSA Assay (FDA approved for NASAL specimens only), is one component of a comprehensive MRSA colonization surveillance program. It is not intended to diagnose MRSA infection nor to guide or monitor treatment for MRSA infections. Test performance is not FDA approved in patients less than 71 years old. Performed at [facility], [address], [place], [place] [address]          Radiology Studies: ECHOCARDIOGRAM LIMITED Result Date: 11/11/2024    ECHOCARDIOGRAM LIMITED REPORT   Patient Name:   ASHONTI LEANDRO [facility] Date of Exam: 11/11/2024 Medical Rec #:  [id number]        Height:       64.0 in Accession #:    [id number]       Weight:       156.3 lb Date of Birth:  05-26-1966        BSA:          1.762 m Patient Age:    58 years         BP:           156/74 mmHg Patient Gender: F                HR:           85 bpm. Exam Location:  Inpatient Procedure: Limited Echo, Limited Color Doppler and Cardiac Doppler (Both            Spectral and  Color Flow Doppler were utilized during procedure). Indications:    Congestive Heart Failure I50.9  History:        Patient has  prior history of Echocardiogram examinations, most                 recent 10/31/2024. CHF, Signs/Symptoms:Altered Mental Status;                 Risk Factors:Sleep Apnea, Diabetes and Dyslipidemia.  Sonographer:    Koleen Popper RDCS Referring Phys: [id number] JORDAN LEE IMPRESSIONS  1. Left ventricular ejection fraction, by estimation, is 60 to 65%. The left ventricle has normal function. The left ventricle has no regional wall motion abnormalities.  2. Right ventricular systolic function is normal. The right ventricular size is normal.  3. The mitral valve is normal in structure. Trivial mitral valve regurgitation. No evidence of mitral stenosis.  4. The aortic valve is grossly normal.  5. The inferior vena cava is normal in size with <50% respiratory variability, suggesting right atrial pressure of 8 mmHg. Comparison(s): Prior images reviewed side by side. Changes from prior study are noted. EF has normalized compared to prior. FINDINGS  Left Ventricle: Left ventricular ejection fraction, by estimation, is 60 to 65%. The left ventricle has normal function. The left ventricle has no regional wall motion abnormalities. The left ventricular internal cavity size was normal in size. There is  no left ventricular hypertrophy. Right Ventricle: The right ventricular size is normal. Right ventricular systolic function is normal. Mitral Valve: The mitral valve is normal in structure. Trivial mitral valve regurgitation. No evidence of mitral valve stenosis. Tricuspid Valve: The tricuspid valve is normal in structure. Tricuspid valve regurgitation is not demonstrated. No evidence of tricuspid stenosis. Aortic Valve: The aortic valve is grossly normal. Venous: The inferior vena cava is normal in size with less than 50% respiratory variability, suggesting right atrial pressure of 8 mmHg. Additional Comments: Spectral Doppler performed. Color Doppler performed.  LEFT VENTRICLE PLAX 2D LVIDd:         4.90 cm LVIDs:         2.90 cm  LV PW:         1.00 cm LV IVS:        0.80 cm LVOT diam:     1.80 cm LV SV:         60 LV SV Index:   34 LVOT Area:     2.54 cm  LV Volumes (MOD) LV vol d, MOD A2C: 121.0 ml LV vol d, MOD A4C: 115.0 ml LV vol s, MOD A2C: 45.2 ml LV vol s, MOD A4C: 37.6 ml LV SV MOD A2C:     75.8 ml LV SV MOD A4C:     115.0 ml LV SV MOD BP:      76.4 ml RIGHT VENTRICLE             IVC RV S prime:     18.60 cm/s  IVC diam: 1.70 cm TAPSE (M-mode): 1.9 cm LEFT ATRIUM           Index LA diam:      3.40 cm 1.93 cm/m LA Vol (A2C): 27.3 ml 15.50 ml/m  AORTIC VALVE LVOT Vmax:   132.00 cm/s LVOT Vmean:  89.100 cm/s LVOT VTI:    0.237 m  AORTA Ao Root diam: 2.80 cm MITRAL VALVE MV Area (PHT): 4.21 cm     SHUNTS MV Decel Time: 180 msec     Systemic VTI:  0.24 m  MV E velocity: 72.30 cm/s   Systemic Diam: 1.80 cm MV A velocity: 104.00 cm/s MV E/A ratio:  0.70 Shelda Bruckner MD Electronically signed by Shelda Bruckner MD Signature Date/Time: 11/11/2024/5:50:26 PM    Final         Scheduled Meds:  acetaminophen   650 mg Per Tube Q6H   apixaban  5 mg Oral BID   ascorbic acid  500 mg Oral BID   Chlorhexidine  Gluconate Cloth  6 each Topical Daily   copper   2 mg Oral Daily   feeding supplement  237 mL Oral BID BM   feeding supplement (OSMOLITE 1.5 CAL)  1,440 mL Per Tube Q24H   feeding supplement (PROSource TF20)  60 mL Per Tube BID   free water   30 mL Per Tube Q4H   furosemide   40 mg Per Tube Daily   insulin  aspart  0-15 Units Subcutaneous Q4H   levothyroxine   112 mcg Per Tube QAC breakfast   losartan  25 mg Oral Daily   multivitamin with minerals  1 tablet Oral Daily   mouth rinse  15 mL Mouth Rinse 4 times per day   pantoprazole  (PROTONIX ) IV  40 mg Intravenous Q12H   polyethylene glycol  17 g Oral BID   sertraline   200 mg Per Tube Daily   spironolactone   12.5 mg Per Tube Daily   vitamin A   10,000 Units Oral Daily   zinc  sulfate (50mg  elemental zinc )  220 mg Per Tube Daily   Continuous Infusions:      LOS: 14 days   Time spent:  Elsie JAYSON Mont[place], DO [facility]  If 7PM-7AM, please contact night-coverage [email or web address]  11/12/2024, 7:18 AM

## 2024-11-12 NOTE — Progress Notes (Signed)
 Progress Note  10 Days Post-Op  Subjective: No complaints this morning.  Tolerating soft diet. VAC changed this AM.   Objective: Vital signs in last 24 hours: Temp:  [97 F (36.1 C)-98.7 F (37.1 C)] 97.8 F (36.6 C) (12/02 0700) Pulse Rate:  [84-90] 87 (12/02 0453) Resp:  [10-20] 17 (12/02 0700) BP: (90-156)/(48-78) 118/51 (12/02 0700) SpO2:  [91 %-98 %] 97 % (12/02 0453) Weight:  [67.5 kg] 67.5 kg (12/02 0453) Last BM Date : 11/08/24  Intake/Output from previous day: 12/01 0701 - 12/02 0700 In: 4688.6 [P.O.:440; I.V.:530.6; Blood:314; WH/HU:6735; IV Piggyback:50] Out: 850 [Urine:850] Intake/Output this shift: Total I/O In: 30 [NG/GT:30] Out: -   PE: General: NAD Heart: regular, rate, and rhythm.   Lungs: Respiratory effort nonlabored Abd: soft, nondistended, appropriately ttp, G-tube present with sutures in place, midline wound mostly clean and shallow with small amount fibrinous tissue at superior wound   Lab Results:  Recent Labs    11/11/24 0430 11/12/24 0407  WBC 13.0* 11.3*  HGB 7.4* 8.1*  HCT 23.1* 25.9*  PLT 496* 601*   BMET Recent Labs    11/11/24 0430 11/12/24 0405  NA 134* 135  K 4.9 5.0  CL 92* 93*  CO2 37* 36*  GLUCOSE 131* 203*  BUN 23* 27*  CREATININE 0.38* 0.43*  CALCIUM  8.0* 8.1*   PT/INR No results for input(s): LABPROT, INR in the last 72 hours. CMP     Component Value Date/Time   NA 135 11/12/2024 0405   NA 142 09/23/2024 1030   K 5.0 11/12/2024 0405   CL 93 (L) 11/12/2024 0405   CO2 36 (H) 11/12/2024 0405   GLUCOSE 203 (H) 11/12/2024 0405   BUN 27 (H) 11/12/2024 0405   BUN 17 09/23/2024 1030   CREATININE 0.43 (L) 11/12/2024 0405   CREATININE 0.82 06/18/2013 1009   CALCIUM  8.1 (L) 11/12/2024 0405   PROT 4.1 (L) 11/04/2024 0500   PROT 6.5 06/28/2024 0842   ALBUMIN  <1.5 (L) 11/04/2024 0500   ALBUMIN  4.5 06/28/2024 0842   AST 88 (H) 11/04/2024 0500   ALT 64 (H) 11/04/2024 0500   ALKPHOS 48 11/04/2024 0500    BILITOT 0.8 11/04/2024 0500   BILITOT 0.3 06/28/2024 0842   GFRNONAA >60 11/12/2024 0405   GFRNONAA 87 06/18/2013 1009   GFRAA >60 08/04/2020 1038   GFRAA >89 06/18/2013 1009   Lipase     Component Value Date/Time   LIPASE 22 11/01/2024 0450       Studies/Results: ECHOCARDIOGRAM LIMITED Result Date: 11/11/2024    ECHOCARDIOGRAM LIMITED REPORT   Patient Name:   GEARL KIMBROUGH [facility] Date of Exam: 11/11/2024 Medical Rec #:  [id number]        Height:       64.0 in Accession #:    [id number]       Weight:       156.3 lb Date of Birth:  09-07-1966        BSA:          1.762 m Patient Age:    58 years         BP:           156/74 mmHg Patient Gender: F                HR:           85 bpm. Exam Location:  Inpatient Procedure: Limited Echo, Limited Color Doppler and Cardiac Doppler (Both  Spectral and Color Flow Doppler were utilized during procedure). Indications:    Congestive Heart Failure I50.9  History:        Patient has prior history of Echocardiogram examinations, most                 recent 10/31/2024. CHF, Signs/Symptoms:Altered Mental Status;                 Risk Factors:Sleep Apnea, Diabetes and Dyslipidemia.  Sonographer:    Koleen Popper RDCS Referring Phys: [id number] JORDAN LEE IMPRESSIONS  1. Left ventricular ejection fraction, by estimation, is 60 to 65%. The left ventricle has normal function. The left ventricle has no regional wall motion abnormalities.  2. Right ventricular systolic function is normal. The right ventricular size is normal.  3. The mitral valve is normal in structure. Trivial mitral valve regurgitation. No evidence of mitral stenosis.  4. The aortic valve is grossly normal.  5. The inferior vena cava is normal in size with <50% respiratory variability, suggesting right atrial pressure of 8 mmHg. Comparison(s): Prior images reviewed side by side. Changes from prior study are noted. EF has normalized compared to prior. FINDINGS  Left Ventricle: Left ventricular ejection  fraction, by estimation, is 60 to 65%. The left ventricle has normal function. The left ventricle has no regional wall motion abnormalities. The left ventricular internal cavity size was normal in size. There is  no left ventricular hypertrophy. Right Ventricle: The right ventricular size is normal. Right ventricular systolic function is normal. Mitral Valve: The mitral valve is normal in structure. Trivial mitral valve regurgitation. No evidence of mitral valve stenosis. Tricuspid Valve: The tricuspid valve is normal in structure. Tricuspid valve regurgitation is not demonstrated. No evidence of tricuspid stenosis. Aortic Valve: The aortic valve is grossly normal. Venous: The inferior vena cava is normal in size with less than 50% respiratory variability, suggesting right atrial pressure of 8 mmHg. Additional Comments: Spectral Doppler performed. Color Doppler performed.  LEFT VENTRICLE PLAX 2D LVIDd:         4.90 cm LVIDs:         2.90 cm LV PW:         1.00 cm LV IVS:        0.80 cm LVOT diam:     1.80 cm LV SV:         60 LV SV Index:   34 LVOT Area:     2.54 cm  LV Volumes (MOD) LV vol d, MOD A2C: 121.0 ml LV vol d, MOD A4C: 115.0 ml LV vol s, MOD A2C: 45.2 ml LV vol s, MOD A4C: 37.6 ml LV SV MOD A2C:     75.8 ml LV SV MOD A4C:     115.0 ml LV SV MOD BP:      76.4 ml RIGHT VENTRICLE             IVC RV S prime:     18.60 cm/s  IVC diam: 1.70 cm TAPSE (M-mode): 1.9 cm LEFT ATRIUM           Index LA diam:      3.40 cm 1.93 cm/m LA Vol (A2C): 27.3 ml 15.50 ml/m  AORTIC VALVE LVOT Vmax:   132.00 cm/s LVOT Vmean:  89.100 cm/s LVOT VTI:    0.237 m  AORTA Ao Root diam: 2.80 cm MITRAL VALVE MV Area (PHT): 4.21 cm     SHUNTS MV Decel Time: 180 msec     Systemic VTI:  0.24  m MV E velocity: 72.30 cm/s   Systemic Diam: 1.80 cm MV A velocity: 104.00 cm/s MV E/A ratio:  0.70 Shelda Bruckner MD Electronically signed by Shelda Bruckner MD Signature Date/Time: 11/11/2024/5:50:26 PM    Final      Anti-infectives: Anti-infectives (From admission, onward)    Start     Dose/Rate Route Frequency Ordered Stop   10/31/24 1400  piperacillin -tazobactam (ZOSYN ) IVPB 3.375 g        3.375 g 12.5 mL/hr over 240 Minutes Intravenous Every 8 hours 10/31/24 0751 11/08/24 0029   10/31/24 1100  micafungin  (MYCAMINE ) 100 mg in sodium chloride  0.9 % 100 mL IVPB        100 mg 105 mL/hr over 1 Hours Intravenous Every 24 hours 10/31/24 0901 11/04/24 1354   10/30/24 0600  piperacillin -tazobactam (ZOSYN ) IVPB 3.375 g  Status:  Discontinued        3.375 g 12.5 mL/hr over 240 Minutes Intravenous Every 8 hours 10/29/24 2350 10/31/24 0751   10/29/24 2145  piperacillin -tazobactam (ZOSYN ) IVPB 3.375 g        3.375 g 100 mL/hr over 30 Minutes Intravenous  Once 10/29/24 2137 10/29/24 2205   10/29/24 1700  cefTRIAXone  (ROCEPHIN ) 1 g in sodium chloride  0.9 % 100 mL IVPB  Status:  Discontinued        1 g 200 mL/hr over 30 Minutes Intravenous  Once 10/29/24 1646 10/29/24 1656   10/29/24 1700  metroNIDAZOLE  (FLAGYL ) IVPB 500 mg        500 mg 100 mL/hr over 60 Minutes Intravenous  Once 10/29/24 1646 10/29/24 1809   10/29/24 1700  cefTRIAXone  (ROCEPHIN ) 2 g in sodium chloride  0.9 % 100 mL IVPB        2 g 200 mL/hr over 30 Minutes Intravenous  Once 10/29/24 1656 10/29/24 1733        Assessment/Plan POD 14, s/p ex lap with LOA, g-tube placement in gastric remnant Dr. Vernetta 11/18 secondary to SBO with possible perforation POD 12, s/p ex lap with washout, Dr. Tanda 11/20 POD 10, s/p washout, abdominal closure Dr. Tanda 11/22 - continue VAC T/F - may DC at next change or prior to DC given how shallow wound is - soft diet  - Cyclical tube feeds overnight to help with mobilization/therapies.  Can hopefully stop TFs as diet is advanced and she is eating enough.    FEN - soft diet. Ok to wean cyclic TF as PO intake improves, ensure  VTE - eliquis started yesterday  ID - micafungin  5 doses completed; Zosyn   11/20>11/28   - per CCM -  E coli bacteremia -completed 1 week of Zosyn  Septic shock -resolved CAD/Heart failure - EF 20-25% on echo 11/21 AKI - resolved DM - SSI Leukopenia - resolved, secondary to sepsis Thrombocytopenia - resolved Hypocalcemia - per CCM Neck wound - unclear etiology, have asked nursing to place mepilex over this to protect it.  Avoid dressings or lines in this area. L hand ischemic changes -  VVS following, s/p embolectomy radial artery and repair, likely will need more surgery. Hand surgery consulted   DISPO- CIR declined, family working towards SNF.    LOS: 14 days   Burnard JONELLE Louder, [facility] 11/12/2024, 10:21 AM Please see Amion for pager number during day hours 7:00am-4:30pm

## 2024-11-12 NOTE — Progress Notes (Signed)
 Occupational Therapy Treatment Patient Details Name: Morgan [name] MRN: [id number] DOB: 1966/11/26 Today's Date: 11/12/2024   History of present illness 58 yo F adm 10/29/24 with SBO s/p ex lap with g-tube placement same date. 11/20 and 11/22 repeat ex lap with VAC. VDRF 11/18-11/24. Pressor support with thrombosed radial artery s/p Lt radial artery embolectomy11/22. PMH: SBO, gastric bypass, DM, OSA, HLD, hypotension, bradycardia, Lt TSA   OT comments  Focus of session on L hand ROM and functional use of L hand. Intrinsic weakness in median n distribution. Unable to oppose thumb to digits or adduct fingers. PIP/DIP AROM improved as well as overall ROM. Will continue to monitor for splint needs. Encourage pt to use foam cube, complete prayer stretches and complete ROM L hand throughout the day. Will continue to follow.       If plan is discharge home, recommend the following:  Two people to help with walking and/or transfers;A lot of help with bathing/dressing/bathroom;Assistance with cooking/housework;Direct supervision/assist for medications management;Direct supervision/assist for financial management;Assist for transportation;Help with stairs or ramp for entrance   Equipment Recommendations  Other (comment)    Recommendations for Other Services PT consult;Rehab consult    Precautions / Restrictions Precautions Precautions: Fall;Other (comment) Recall of Precautions/Restrictions: Impaired Precaution/Restrictions Comments: Abd VAC, PICC Restrictions Weight Bearing Restrictions Per Provider Order: No       Mobility                       Balance                                           ADL either performed or assessed with clinical judgement   ADL Overall ADL's : Needs assistance/impaired     Grooming: Set up;Sitting   Upper Body Bathing: Minimal assistance;Sitting                                  Extremity/Trunk Assessment  Upper Extremity Assessment Upper Extremity Assessment: LUE deficits/detail LUE Deficits / Details: guaze removed; large blister @ medial aspect of IP; tip of thumb black adn hardened; good isolated movemetn of IP; limited MP and CMC movemetn however functional; kerlex replaced adn secured at wrist. Apparent dysfunction in median n distribution; unable to adduct, oppose thumb/ index/middle, no adduction of fingers, IP flexion index/middle limited but improving; developing tight MP joints and claw hand. Full composite ROM after intervention.encouraged use of foam cube for grip and pinch strengthening LUE Coordination: decreased fine motor            Vision       Perception     Praxis     Communication     Cognition Arousal: Alert Behavior During Therapy: Flat affect Cognition: Cognition impaired     Awareness: Online awareness impaired Memory impairment (select all impairments): Working memory Attention impairment (select first level of impairment): Selective attention Executive functioning impairment (select all impairments): Sequencing, Reasoning                   Following commands: Impaired Following commands impaired: Follows one step commands with increased time      Cueing      Exercises Hand Exercises Wrist Flexion: AROM, Left, 10 reps Wrist Extension: AROM, Left, AAROM, 10 reps Wrist Ulnar Deviation: AROM, AAROM, Left, 10  reps Wrist Radial Deviation: AROM, AAROM, Left, 10 reps Digit Composite Flexion: AROM, Left, 10 reps, Seated Composite Extension: AROM, Left, 10 reps, Seated Digit Composite Abduction: Left, AROM, 10 reps, Seated Digit Composite Adduction: Left, AAROM, 10 reps, Seated Digit Lifts: Left, AROM, 10 reps, Seated Thumb Abduction: AROM, Left, 10 reps Thumb Adduction: PROM, AAROM, Left, 15 reps Opposition: AAROM, PROM, Left, 5 reps, Seated Other Exercises Other Exercises: prayer stretch x 5, hold x 10 seconds Other Exercises: IPblocking  exercises for DIP adn PIP ROM Other Exercises: nerve glides L hand    Shoulder Instructions       General Comments      Pertinent Vitals/ Pain       Pain Assessment Pain Assessment: Faces Faces Pain Scale: Hurts little more Pain Location: LUE and abdomen Pain Descriptors / Indicators: Sore, Discomfort, Grimacing, Guarding, Operative site guarding Pain Intervention(s): Limited activity within patient's tolerance  Home Living                                          Prior Functioning/Environment              Frequency  Min 3X/week        Progress Toward Goals  OT Goals(current goals can now be found in the care plan section)  Progress towards OT goals: Progressing toward goals  Acute Rehab OT Goals Patient Stated Goal: improve the use of her hand OT Goal Formulation: With patient Time For Goal Achievement: 11/18/24 Potential to Achieve Goals: Good ADL Goals Pt Will Perform Upper Body Dressing: with supervision;sitting Pt Will Perform Lower Body Dressing: with min assist;sitting/lateral leans;sit to/from stand Pt Will Transfer to Toilet: with min assist;stand pivot transfer;bedside commode Pt/caregiver will Perform Home Exercise Program: Increased ROM;Increased strength;Both right and left upper extremity;With minimal assist;With written HEP provided Additional ADL Goal #1: Pt will perform bed mobility min A in prep for seated ADLs  Plan      Co-evaluation                 AM-PAC OT 6 Clicks Daily Activity     Outcome Measure   Help from another person eating meals?: A Little Help from another person taking care of personal grooming?: A Little Help from another person toileting, which includes using toliet, bedpan, or urinal?: A Lot Help from another person bathing (including washing, rinsing, drying)?: A Lot Help from another person to put on and taking off regular upper body clothing?: A Lot Help from another person to put on and  taking off regular lower body clothing?: A Lot 6 Click Score: 14    End of Session    OT Visit Diagnosis: Unsteadiness on feet (R26.81);Other abnormalities of gait and mobility (R26.89);Muscle weakness (generalized) (M62.81)   Activity Tolerance Patient tolerated treatment well   Patient Left in bed;with call bell/phone within reach;with bed alarm set   Nurse Communication Mobility status        Time: 8676-8660 OT Time Calculation (min): 16 min  Charges: OT General Charges $OT Visit: 1 Visit OT Treatments $Therapeutic Exercise: 8-22 mins  Kreg Sink, OT/L   Acute OT Clinical Specialist Acute Rehabilitation Services Pager [phone] Office [phone]   [facility] 11/12/2024, 2:30 PM

## 2024-11-12 NOTE — Plan of Care (Signed)
  Problem: Education: Goal: Knowledge of General Education information will improve Description: Including pain rating scale, medication(s)/side effects and non-pharmacologic comfort measures Outcome: Progressing   Problem: Clinical Measurements: Goal: Will remain free from infection Outcome: Progressing Goal: Respiratory complications will improve Outcome: Progressing Goal: Cardiovascular complication will be avoided Outcome: Progressing   Problem: Nutrition: Goal: Adequate nutrition will be maintained Outcome: Progressing   Problem: Coping: Goal: Level of anxiety will decrease Outcome: Progressing   Problem: Safety: Goal: Ability to remain free from injury will improve Outcome: Progressing   Problem: Health Behavior/Discharge Planning: Goal: Ability to manage health-related needs will improve Outcome: Not Progressing   Problem: Activity: Goal: Risk for activity intolerance will decrease Outcome: Not Progressing   Problem: Skin Integrity: Goal: Risk for impaired skin integrity will decrease Outcome: Not Progressing

## 2024-11-13 ENCOUNTER — Telehealth: Payer: Medicare Other | Admitting: Family Medicine

## 2024-11-13 ENCOUNTER — Other Ambulatory Visit: Payer: Self-pay | Admitting: Family Medicine

## 2024-11-13 DIAGNOSIS — J301 Allergic rhinitis due to pollen: Secondary | ICD-10-CM

## 2024-11-13 DIAGNOSIS — G43109 Migraine with aura, not intractable, without status migrainosus: Secondary | ICD-10-CM

## 2024-11-13 LAB — GLUCOSE, CAPILLARY
Glucose-Capillary: 127 mg/dL — ABNORMAL HIGH (ref 70–99)
Glucose-Capillary: 156 mg/dL — ABNORMAL HIGH (ref 70–99)
Glucose-Capillary: 173 mg/dL — ABNORMAL HIGH (ref 70–99)

## 2024-11-13 LAB — COPPER, SERUM
Copper: 113 ug/dL (ref 80–158)
Copper: 137 ug/dL (ref 80–158)

## 2024-11-13 MED ORDER — PANTOPRAZOLE SODIUM 40 MG PO TBEC
40.0000 mg | DELAYED_RELEASE_TABLET | Freq: Two times a day (BID) | ORAL | Status: DC
  Administered 2024-11-13 – 2024-11-18 (×10): 40 mg via ORAL
  Filled 2024-11-13 (×10): qty 1

## 2024-11-13 MED ORDER — ZIPRASIDONE HCL 40 MG PO CAPS
60.0000 mg | ORAL_CAPSULE | Freq: Every day | ORAL | Status: DC
  Administered 2024-11-13 – 2024-11-17 (×5): 60 mg via ORAL
  Filled 2024-11-13 (×7): qty 1

## 2024-11-13 MED ORDER — OSMOLITE 1.5 CAL PO LIQD
996.0000 mL | ORAL | Status: DC
  Administered 2024-11-13 – 2024-11-14 (×2): 996 mL
  Filled 2024-11-13: qty 1000

## 2024-11-13 MED ORDER — KATE FARMS STANDARD 1.4 EN LIQD
325.0000 mL | Freq: Two times a day (BID) | ENTERAL | Status: DC
  Administered 2024-11-15 – 2024-11-17 (×4): 325 mL via ORAL
  Filled 2024-11-13 (×12): qty 325

## 2024-11-13 NOTE — Progress Notes (Addendum)
 Nutrition Follow-up  DOCUMENTATION CODES:   Severe malnutrition in context of chronic illness  INTERVENTION:  Continue nocturnal feedings via G-tube: Osmolite 1.5 @ 83 ml/hr x 12 hrs overnight from 1800-0600 (1,000 ml)  ProSource TF 20- Give 60ml BID via tube, each supplement provides 80kcal and 20g of protein.  Free water  flushes 30ml q4 hours to maintain tube patency  Regimen provides 1660 kcal/day, 102 g/day protein and 942 ml/day of free water     Encourage PO intake - on GI soft diet Assist with meal ordering  Nursing to assist with meals as needed [employer] 1.4 PO BID, each supplement provides 455 kcal and 20 grams protein.  Juven Fruit Punch BID via tube, each serving provides 95kcal and 2.5g of protein (amino acids  glutamine and arginine) MVI daily via tube  Weekly weights  Start 48 hour calorie count to access intake Vitamin/Mineral Repletion MVI daily po Vitamin C  500mg  po BID x 30 days (ends 12/30) Zinc  220mg  (50mg  elemental) po daily x 30 days (ends 12/08) Copper  repletion ended 11/30, now normal, however CRP elevated which may be falsely increasing copper  levels. May need to recheck copper  and CRP again at a later date. Vitamin A  10,000 units po daily x 30 days (ends 12/31)  NUTRITION DIAGNOSIS:   Severe Malnutrition related to chronic illness as evidenced by moderate muscle depletion, severe muscle depletion, energy intake < or equal to 75% for > or equal to 1 month, edema, percent weight loss. - Ongoing   GOAL:   Patient will meet greater than or equal to 90% of their needs - Meeting via TF  MONITOR:   PO intake, Diet advancement, Weight trends, Labs, TF tolerance, Skin, I & O's  REASON FOR ASSESSMENT:   Consult Enteral/tube feeding initiation and management, New TPN/TNA  ASSESSMENT:  58 y.o. female with PMH of Roux-en-Y gastric bypass (2018) who presented with abdominal pain, lactic acidosis and lipase greater than 2800. Admitted for SBO.  11/18  Admitted to [facility], OR: Ex Lap with LOA, G-tube placement in gastric remnant, wound VAC, vent postop 11/20 OR: Ex Lap with washout, wound VAC 11/22 OR: Washout with abdominal closure, wound VAC to open wound; Ischemic hand with occluded L radial artery s/p revascularization by VVS 11/23 TPN initiated, VHP at 10 ml/hr initiated 11/24 Extubated. TPN continues at 30 ml/hr, VAF 1.2 at 20 11/25 TPN continue at 30 ml/hr, VAF 1.2 up to 30 ml/hr 11/27 TPN off 11/28 up to goal rate (43ml/hr) TF today; diet advanced to full liquids 11/30 Changed to nocturnal tube feeds 12/01 GI soft diet   Pt doing well this morning, tolerating soft diet and nocturnal tube feeds. Does complain of lower back pain last night. Appetite is still poor but seems to be improving. Only had bites of dinner last night, 50% of sausage and potatoes this morning (130 kcal, 3 gm protein). Has not been drinking Ensures as pt reports they upset her stomach. Pt states she is lactose intolerant but still eats cheese ice cream etc. Willing to try [employer]. No Nausea this morning but pt reports usually she gets nauseous after eating. Trying to work with PT/OT but gets tired easily.   Start calorie count to access intake and modify tube feeding regimen as appropriate. Will decrease tube feeding rate during calorie count to promote appetite.   Coper levels now normal, however CRP elevated which may be falsely increasing copper  levels. May need to recheck copper  and CRP again at a later date.  Has  wound vac T/F. May DC prior to DC. Has L hand splint. Nursing to assist with meals as needed.   Dispo: SNF  Admit weight: 59.9 kg - Accuracy? Current weight: 67.5 kg   Average Meal Intake: 11/28-12/3: 27% intake x 7 recorded meals  Nutritionally Relevant Medications: Scheduled Meds:  ascorbic acid  500 mg Oral BID   feeding supplement (KATE FARMS STANDARD ENT 1.4)  325 mL Oral BID BM   feeding supplement (OSMOLITE 1.5 CAL)  996 mL Per Tube  Q24H   feeding supplement (PROSource TF20)  60 mL Per Tube BID   free water   30 mL Per Tube Q4H   furosemide   40 mg Per Tube Daily   levothyroxine   112 mcg Per Tube QAC breakfast   multivitamin with minerals  1 tablet Oral Daily   vitamin A   10,000 Units Oral Daily   zinc  sulfate (50mg  elemental zinc )  220 mg Per Tube Daily   Labs Reviewed: BUN 27 Creatinine 0.43 Zinc  15 (10/30/24) Vitamin A  9.9 (11/09/24) Copper  113 (11/11/24) CRP 35.5 (11/12/24) CBG range from 83-173 mg/dL over the last 24 hours HgbA1c 5.3  Diet Order:   Diet Order             DIET SOFT Room service appropriate? Yes; Fluid consistency: Thin  Diet effective now                   EDUCATION NEEDS:   Not appropriate for education at this time  Skin:  Skin Assessment: Skin Integrity Issues: Skin Integrity Issues:: Stage III DTI: R Heel, Coccyx Stage III: Coccyx Incisions: Large abdominal wound at surgical incision Other: necrotic L thumb (seen by Ortho- amputation at some point), neck wound  Last BM:  11/27 - type 7 x2  Height:   Ht Readings from Last 1 Encounters:  10/29/24 5' 4 (1.626 m)    Weight:   Wt Readings from Last 1 Encounters:  11/12/24 67.5 kg    Ideal Body Weight:  54.55 kg  BMI:  Body mass index is 25.54 kg/m.  Estimated Nutritional Needs:   Kcal:  1900-2100 kcal  Protein:  110-130 gm  Fluid:  >2L/day   Olivia Kenning, RD Registered Dietitian  See Amion for more information

## 2024-11-13 NOTE — Progress Notes (Signed)
 PROGRESS NOTE    Morgan [name]  FMW:[id number] DOB: 05/26/1966 DOA: 10/29/2024 PCP: [name], DO  Subjective: No acute events overnight. Patient reports feeling okay. Had pain on the right side of her abdomen/back that resolved as of this morning. She states she has been able to eat well, but doesn't like the Ensure much.     Hospital Course: 58 year old female with history of Roux-en-Y gastric bypass who presented to the emergency department with abdominal pain, lactic acidosis and lipase greater than 2800.  She had CCS evaluation and went for exploratory laparotomy.  Found to have dense adhesive band that was obstructing her proximal small bowel as well as biliary leak without evidence of perforation.  She had a gastrostomy tube placed with a wound VAC.  Initially on Levophed , vasopressin , intubated on bicarb drip postoperatively. Patient's septic shock continue to improve, off pressors, NG tube remains in place, postoperative care per general surgery including diet advancement.  Patient completed antibiotics on 11/27, completed antifungals 11/25. Left thumb ischemia status post thrombectomy appears to be stable, vascular surgery following. Patient's chronic comorbid conditions otherwise appear to be improving if not stable.   Assessment and Plan:  Septic shock due to perforated SBO with peritonitis, E Coli bacteremia, resolved SBO due to adhesion Status post ex lap with lysis of adhesions 11/18; washouts on 11/20 and abdominal closure on 11/22  -wound VAC ongoing, last bandage exchange 11/12/2024 -may be able to discontinue this week - zosyn  completed for ecoli bactermia; micafungin  completed 11/25 for peritonitis  - G-tube in gastric remnant -May be pulled in the next few days per surgery - TPN off as of 11/27, still on cyclic feeds with daytime PO intake - started on 48 hour calorie count to monitor daytime intake and can hopefully dc the nocturnal TF   Acute HFrEF-  concerning given CAD hx vs septic cardiomyopathy, improving - Off dobutamine  - GDMT on hold due to hypotension per heart failure team -continue furosemide  - Cardiac cath pending further recovery per cardiology expertise   Critical left hand ischemia due to thrombosed radial artery, s/p thrombectomy - Left radial arterial line discontinued 11/20, status post radial artery embolectomy with vascular surgery  - Allow this to declare then plan for amputation (suspected to take 3-4 weeks) - 12/2 reevaluated by vascular surgery, tip appears to be nonsalvageable but thumb improving - Transition to p.o. DOAC - Staple removal planned 12/10 -notify vascular surgery if patient discharges before this date   Urinary retention, improving Acute kidney injury, resolved - Foley previously removed -currently with external urinary cath while in bed - Strict I/O - Renally dose meds, avoid nephrotoxic meds   Thrombocytopenia due to sepsis, resolved - no need for platelet transfusion; monitor   Acute anemia, recovering - Presumed blood loss vs worsening chronic anemia of chronic disease/aplastic anemia - Hemoglobin improved to 8.6 after transfusion, 1 unit 11/25 - Hb stable at 8.1 as of 11/12/24   Acute hypoxic respiratory failure postoperatively RLL PNA- suspect aspiration from SBO - extubated successfully 11/24 - wean O2 for sat >92%  - IS and mobilize as able   Hypoglycemia  H/o DM2 but  A1c 5.3 and not on meds PTA-- resolved after Roux-en-Y? -monitor; not needing insulin  and discontinued   Hypothyroidism - con't synthroid    Elevated transaminases, hyperbilirubinemia - stabilizing  - bilirubin back to normal    Depression/Anxiety  - Increase sertraline  back to home dose - resumed ziprasidone     Pressure injury noted, POA  Wound 10/31/24 1716 Pressure Injury Coccyx Medial Deep Tissue Pressure Injury - Purple or maroon localized area of discolored intact skin or blood-filled blister due to  damage of underlying soft tissue from pressure and/or shear. (Active)     DVT prophylaxis: SCD's Start: 10/29/24 2346 apixaban (ELIQUIS) tablet 5 mg     Code Status: Full Code Disposition Plan: TBD Reason for continuing need for hospitalization: Not medically stable   Objective: Vitals:   11/12/24 1559 11/12/24 1639 11/12/24 2120 11/13/24 0504  BP: 131/60 125/60 (!) 123/55 131/62  Pulse: 79 81 79 76  Resp: 15 16 17 17   Temp: 98.3 F (36.8 C) 97.8 F (36.6 C) 97.9 F (36.6 C) 97.8 F (36.6 C)  TempSrc: Oral Oral Oral Oral  SpO2: 99% 96% 99% 99%  Weight:      Height:        Intake/Output Summary (Last 24 hours) at 11/13/2024 1556 Last data filed at 11/13/2024 1300 Gross per 24 hour  Intake 390 ml  Output 1200 ml  Net -810 ml   Filed Weights   11/08/24 0451 11/10/24 2312 11/12/24 0453  Weight: 71.8 kg 70.9 kg 67.5 kg    Examination:  Physical Exam Vitals and nursing note reviewed.  Constitutional:      General: She is not in acute distress. Cardiovascular:     Rate and Rhythm: Normal rate.  Pulmonary:     Effort: No respiratory distress.  Abdominal:     General: There is no distension.     Tenderness: There is no abdominal tenderness.     Comments: PEG in place  Skin:    Comments: Left thumb with demarcated necrotic tip      Data Reviewed: I have personally reviewed following labs and imaging studies  CBC: Recent Labs  Lab 11/07/24 0440 11/08/24 0501 11/09/24 0424 11/11/24 0430 11/12/24 0407  WBC 11.3* 10.5 11.2* 13.0* 11.3*  HGB 7.9* 7.6* 7.2* 7.4* 8.1*  HCT 23.9* 23.0* 22.2* 23.1* 25.9*  MCV 87.9 87.8 88.4 90.2 89.3  PLT 246 282 323 496* 601*   Basic Metabolic Panel: Recent Labs  Lab 11/06/24 1700 11/07/24 0440 11/08/24 0501 11/09/24 0424 11/10/24 0340 11/11/24 0430 11/12/24 0405  NA  --    < > 136 132* 133* 134* 135  K  --    < > 4.1 4.2 4.5 4.9 5.0  CL  --    < > 98 95* 97* 92* 93*  CO2  --    < > 28 30 30  37* 36*  GLUCOSE  --     < > 129* 126* 143* 131* 203*  BUN  --    < > 14 14 15  23* 27*  CREATININE  --    < > 0.37* 0.43* 0.39* 0.38* 0.43*  CALCIUM   --    < > 7.5* 7.4* 7.8* 8.0* 8.1*  MG 1.6*  --  1.7 2.1  --  1.7  --   PHOS 2.9  --   --   --   --  3.9  --    < > = values in this interval not displayed.   GFR: Estimated Creatinine Clearance: 73.2 mL/min (A) (by C-G formula based on SCr of 0.43 mg/dL (L)). Liver Function Tests: No results for input(s): AST, ALT, ALKPHOS, BILITOT, PROT, ALBUMIN  in the last 168 hours. No results for input(s): LIPASE, AMYLASE in the last 168 hours. No results for input(s): AMMONIA in the last 168 hours. Coagulation Profile: No results for input(s): INR,  PROTIME in the last 168 hours. Cardiac Enzymes: No results for input(s): CKTOTAL, CKMB, CKMBINDEX, TROPONINI in the last 168 hours. ProBNP, BNP (last 5 results) Recent Labs    11/01/24 0920  PROBNP >35,000.0*   HbA1C: No results for input(s): HGBA1C in the last 72 hours. CBG: Recent Labs  Lab 11/12/24 1637 11/12/24 2119 11/13/24 0025 11/13/24 0458 11/13/24 0806  GLUCAP 95 163* 127* 156* 173*   Lipid Profile: No results for input(s): CHOL, HDL, LDLCALC, TRIG, CHOLHDL, LDLDIRECT in the last 72 hours. Thyroid  Function Tests: No results for input(s): TSH, T4TOTAL, FREET4, T3FREE, THYROIDAB in the last 72 hours. Anemia Panel: No results for input(s): VITAMINB12, FOLATE, FERRITIN, TIBC, IRON , RETICCTPCT in the last 72 hours. Sepsis Labs: No results for input(s): PROCALCITON, LATICACIDVEN in the last 168 hours.  No results found for this or any previous visit (from the past 240 hours).   Radiology Studies: No results found.  Scheduled Meds:  acetaminophen   650 mg Per Tube Q6H   apixaban   5 mg Oral BID   ascorbic acid   500 mg Oral BID   Chlorhexidine  Gluconate Cloth  6 each Topical Daily   ezetimibe   10 mg Oral Daily   feeding supplement (KATE  FARMS STANDARD ENT 1.4)  325 mL Oral BID BM   feeding supplement (OSMOLITE 1.5 CAL)  996 mL Per Tube Q24H   feeding supplement (PROSource TF20)  60 mL Per Tube BID   free water   30 mL Per Tube Q4H   furosemide   40 mg Per Tube Daily   levothyroxine   112 mcg Per Tube QAC breakfast   losartan   25 mg Oral Daily   metoprolol  succinate  12.5 mg Oral QHS   multivitamin with minerals  1 tablet Oral Daily   mouth rinse  15 mL Mouth Rinse 4 times per day   pantoprazole   40 mg Oral BID   polyethylene glycol  17 g Oral BID   sertraline   200 mg Per Tube Daily   vitamin A   10,000 Units Oral Daily   zinc  sulfate (50mg  elemental zinc )  220 mg Per Tube Daily   Continuous Infusions:   LOS: 15 days   Time spent: 35 minutes  Casimer Dare, MD  [facility]  11/13/2024, 3:56 PM

## 2024-11-13 NOTE — Progress Notes (Signed)
 Physical Therapy Treatment Patient Details Name: Morgan [name] MRN: [id number] DOB: 08/27/1966 Today's Date: 11/13/2024   History of Present Illness 58 yo F adm 10/29/24 with SBO s/p ex lap with g-tube placement same date. 11/20 and 11/22 repeat ex lap with VAC. VDRF 11/18-11/24. Pressor support with thrombosed radial artery s/p Lt radial artery embolectomy11/22. PMH: SBO, gastric bypass, DM, OSA, HLD, hypotension, bradycardia, Lt TSA    PT Comments  Pt resting in bed on arrival, agreeable to session and demonstrating continued progress towards acute goals. Pt with flat affect throughout session, requiring increased time to initiate all mobility with cues for sequencing and safety. Pt requiring grossly min A to complete bed mobility, transfers sit<>stand and progress gait with RW for support. Pt continues to be limited in safe mobility by decreased activity tolerance, weakness, impaired balance/postural reactions and impaired cognition. Current plan remains appropriate to address deficits and maximize functional independence and decrease caregiver burden. Pt continues to benefit from skilled PT services to progress toward functional mobility goals.       If plan is discharge home, recommend the following: Assist for transportation;Assistance with cooking/housework;Help with stairs or ramp for entrance;A little help with bathing/dressing/bathroom;A lot of help with walking and/or transfers;Supervision due to cognitive status   Can travel by private vehicle     No  Equipment Recommendations  BSC/3in1;Rollator (4 wheels) (pending progress; may need regular RW)    Recommendations for Other Services       Precautions / Restrictions Precautions Precautions: Fall;Other (comment) Recall of Precautions/Restrictions: Impaired Precaution/Restrictions Comments: Abd VAC, PICC Restrictions Weight Bearing Restrictions Per Provider Order: No     Mobility  Bed Mobility Overal bed mobility: Needs  Assistance Bed Mobility: Sidelying to Sit, Rolling Rolling: Contact guard assist Sidelying to sit: Min assist       General bed mobility comments: light min A to bring trunk to midline    Transfers Overall transfer level: Needs assistance Equipment used: Rolling walker (2 wheels) Transfers: Sit to/from Stand, Bed to chair/wheelchair/BSC Sit to Stand: Min assist, Contact guard assist   Step pivot transfers: Contact guard assist       General transfer comment: cues for hand placement with pt continuing to pull up on RW, increased time to initaite but good power up    Ambulation/Gait Ambulation/Gait assistance: Min assist Gait Distance (Feet): 10 Feet Assistive device: Rolling walker (2 wheels) Gait Pattern/deviations: Step-through pattern, Decreased stride length, Shuffle, Trunk flexed Gait velocity: decr     General Gait Details: pt taking short shuffling steps with RW for support, x1 standing rest due to fatigue, anterior flexed posture suspect due to abdominal pain/guarding   Stairs             Wheelchair Mobility     Tilt Bed    Modified Rankin (Stroke Patients Only)       Balance Overall balance assessment: Needs assistance Sitting-balance support: Feet supported, Single extremity supported, No upper extremity supported Sitting balance-Leahy Scale: Fair Sitting balance - Comments: forward in chair   Standing balance support: Bilateral upper extremity supported Standing balance-Leahy Scale: Poor Standing balance comment: UE support in standing                            Communication Communication Communication: No apparent difficulties Factors Affecting Communication: Difficulty expressing self  Cognition Arousal: Alert Behavior During Therapy: Flat affect   PT - Cognitive impairments: Initiation, Problem solving, Safety/Judgement, Attention  PT - Cognition Comments: slow processing and initiation  today Following commands: Impaired Following commands impaired: Follows one step commands with increased time    Cueing Cueing Techniques: Verbal cues, Tactile cues, Gestural cues  Exercises      General Comments General comments (skin integrity, edema, etc.): Splint fabricated using ezeform for L hand in intrinsic plus position to increase MP flexion/functional positioning of L hand. n      Pertinent Vitals/Pain Pain Assessment Pain Assessment: Faces Faces Pain Scale: Hurts even more Pain Location: abdomen Pain Descriptors / Indicators: Sore, Discomfort, Grimacing, Guarding, Operative site guarding Pain Intervention(s): Monitored during session, Limited activity within patient's tolerance    Home Living                          Prior Function            PT Goals (current goals can now be found in the care plan section) Acute Rehab PT Goals Patient Stated Goal: to get up PT Goal Formulation: Patient unable to participate in goal setting Time For Goal Achievement: 11/18/24 Progress towards PT goals: Progressing toward goals    Frequency    Min 3X/week      PT Plan      Co-evaluation              AM-PAC PT 6 Clicks Mobility   Outcome Measure  Help needed turning from your back to your side while in a flat bed without using bedrails?: A Little Help needed moving from lying on your back to sitting on the side of a flat bed without using bedrails?: A Lot Help needed moving to and from a bed to a chair (including a wheelchair)?: A Lot Help needed standing up from a chair using your arms (e.g., wheelchair or bedside chair)?: A Little Help needed to walk in hospital room?: Total Help needed climbing 3-5 steps with a railing? : Total 6 Click Score: 12    End of Session Equipment Utilized During Treatment: Oxygen  (defer gait belt 2/2 lines, close chair follow for pt safety) Activity Tolerance: Patient tolerated treatment well;Patient limited by  fatigue Patient left: in chair;with call bell/phone within reach;with chair alarm set Nurse Communication: Mobility status PT Visit Diagnosis: Other abnormalities of gait and mobility (R26.89);Muscle weakness (generalized) (M62.81);Difficulty in walking, not elsewhere classified (R26.2)     Time: 8479-8457 PT Time Calculation (min) (ACUTE ONLY): 22 min  Charges:    $Therapeutic Activity: 8-22 mins PT General Charges $$ ACUTE PT VISIT: 1 Visit                     Quianna Avery R. PTA Acute Rehabilitation Services Office: [phone]   Therisa CHRISTELLA Boor 11/13/2024, 4:03 PM

## 2024-11-13 NOTE — Plan of Care (Signed)

## 2024-11-13 NOTE — Progress Notes (Signed)
 Occupational Therapy Treatment Note  L resting hand splint fabricated for pt to wear when sleeping to improve functional positioning and L hand and reduce further loss of ROM. Pt verbalized understanding regarding need to wear splint during night time hours only.  OT will return for splint check. Will continue to follow. If issues with the splint, please call [phone].      11/13/24 1333  OT Visit Information  Last OT Received On 11/13/24  Assistance Needed +1  History of Present Illness 58 yo F adm 10/29/24 with SBO s/p ex lap with g-tube placement same date. 11/20 and 11/22 repeat ex lap with VAC. VDRF 11/18-11/24. Pressor support with thrombosed radial artery s/p Lt radial artery embolectomy11/22. PMH: SBO, gastric bypass, DM, OSA, HLD, hypotension, bradycardia, Lt TSA  Precautions  Precautions Fall;Other (comment)  Recall of Precautions/Restrictions Impaired  Precaution/Restrictions Comments Abd VAC, PICC  Restrictions  Weight Bearing Restrictions Per Provider Order No  Pain Assessment  Pain Assessment Faces  Faces Pain Scale 6  Pain Location abdomen  Pain Descriptors / Indicators Sore;Discomfort;Grimacing;Guarding;Operative site guarding  Pain Intervention(s) Limited activity within patient's tolerance;RN gave pain meds during session  General Comments  General comments (skin integrity, edema, etc.) Splint fabricated using ezeform for L hand in intrinsic plus position to increase MP flexion/functional positioning of L hand. n  OT - End of Session  Activity Tolerance Patient tolerated treatment well  Patient left in bed;with call bell/phone within reach;with bed alarm set  Nurse Communication Other (comment) (splint use)  OT Assessment/Plan  OT Visit Diagnosis Unsteadiness on feet (R26.81);Other abnormalities of gait and mobility (R26.89);Muscle weakness (generalized) (M62.81)  OT Frequency (ACUTE ONLY) Min 3X/week  Follow Up Recommendations Skilled nursing-short term rehab (<3  hours/day)  Patient can return home with the following Two people to help with walking and/or transfers;A lot of help with bathing/dressing/bathroom;Assistance with cooking/housework;Direct supervision/assist for medications management;Direct supervision/assist for financial management;Assist for transportation;Help with stairs or ramp for entrance  OT Equipment Other (comment)  AM-PAC OT 6 Clicks Daily Activity Outcome Measure (Version 2)  Help from another person eating meals? 3  Help from another person taking care of personal grooming? 3  Help from another person toileting, which includes using toliet, bedpan, or urinal? 2  Help from another person bathing (including washing, rinsing, drying)? 2  Help from another person to put on and taking off regular upper body clothing? 2  Help from another person to put on and taking off regular lower body clothing? 2  6 Click Score 14  Progressive Mobility  What is the highest level of mobility based on the mobility assessment? Level 3 (Stands with assistance) - Balance while standing  and cannot march in place  Mobility Referral Yes  Activity Moved bed into chair position  OT Goal Progression  Progress towards OT goals Progressing toward goals  Acute Rehab OT Goals  Patient Stated Goal get stronger  OT Goal Formulation With patient  Time For Goal Achievement 11/18/24  Potential to Achieve Goals Good  ADL Goals  Pt Will Perform Upper Body Dressing with supervision;sitting  Pt Will Perform Lower Body Dressing with min assist;sitting/lateral leans;sit to/from stand  Pt Will Transfer to Toilet with min assist;stand pivot transfer;bedside commode  Pt/caregiver will Perform Home Exercise Program Increased ROM;Increased strength;Both right and left upper extremity;With minimal assist;With written HEP provided  Additional ADL Goal #1 Pt will perform bed mobility min A in prep for seated ADLs  OT Time Calculation  OT Start Time (ACUTE  ONLY) 1117  OT  Stop Time (ACUTE ONLY) 1215  OT Time Calculation (min) 58 min  OT General Charges  $OT Visit 1 Visit  OT Treatments  $Therapeutic Activity 8-22 mins  $Orthotics Fit/Training 38-52 mins  $ Splint materials basic 1 Supply   Kreg Sink, OT/L   Acute OT Clinical Specialist Acute Rehabilitation Services Pager [phone] Office [phone]

## 2024-11-13 NOTE — TOC Progression Note (Addendum)
 Transition of Care [facility]) - Progression Note    Patient Details  Name: Morgan [name] MRN: [id number] Date of Birth: [birth year]  Transition of Care [facility]) CM/SW Contact  Arlana JINNY Nicholaus [place] Phone Number: [phone] 11/13/2024, 10:05 AM  Clinical Narrative:   [facility] CSW spoke with the patients niece, Harlene (HCPOA) who stated that they would like [facility]. CSW confirmed with facility that they do have bed availability.   HF team is not primary, we signed off. Per medical team She may still have surgical concerns. Will start insurance auth when patient is closer to being medically ready for dc.   Unit CSW/CM will continue to follow and monitor for dc readiness.     Expected Discharge Plan: Home/Self Care Barriers to Discharge: Continued Medical Work up               Expected Discharge Plan and Services In-house Referral:  (rw) Discharge Planning Services: CM Consult   Living arrangements for the past 2 months: Single Family Home                                       Social Drivers of Health (SDOH) Interventions SDOH Screenings   Food Insecurity: Patient Unable To Answer (11/02/2024)  Housing: Patient Unable To Answer (11/02/2024)  Transportation Needs: Patient Unable To Answer (11/02/2024)  Utilities: Patient Unable To Answer (11/02/2024)  Alcohol  Screen: Low Risk  (09/23/2024)  Depression (PHQ2-9): Medium Risk (06/28/2024)  Financial Resource Strain: Medium Risk (09/23/2024)  Physical Activity: Insufficiently Active (09/23/2024)  Social Connections: Patient Unable To Answer (11/02/2024)  Recent Concern: Social Connections - Moderately Isolated (09/23/2024)  Stress: Stress Concern Present (09/23/2024)  Tobacco Use: Low Risk  (10/29/2024)  Health Literacy: Adequate Health Literacy (09/23/2024)    Readmission Risk Interventions     No data to display

## 2024-11-13 NOTE — Progress Notes (Signed)
 Occupational Therapy Treatment Patient Details Name: Morgan [name] MRN: [id number] DOB: 01/05/1966 Today's Date: 11/13/2024   History of present illness 58 yo F adm 10/29/24 with SBO s/p ex lap with g-tube placement same date. 11/20 and 11/22 repeat ex lap with VAC. VDRF 11/18-11/24. Pressor support with thrombosed radial artery s/p Lt radial artery embolectomy11/22. PMH: SBO, gastric bypass, DM, OSA, HLD, hypotension, bradycardia, Lt TSA   OT comments  Focus of session on L hand/wrist ROM. Continues to demonstrate weakness with opposition of thumb/finger, digit adduction and IP flexion of index and middle fingers. Will return to fabricate L resting hand splint in intrinsic plus position.       If plan is discharge home, recommend the following:  Two people to help with walking and/or transfers;A lot of help with bathing/dressing/bathroom;Assistance with cooking/housework;Direct supervision/assist for medications management;Direct supervision/assist for financial management;Assist for transportation;Help with stairs or ramp for entrance   Equipment Recommendations  Other (comment)    Recommendations for Other Services      Precautions / Restrictions Precautions Precautions: Fall;Other (comment) Recall of Precautions/Restrictions: Impaired Precaution/Restrictions Comments: Abd VAC, PICC Restrictions Weight Bearing Restrictions Per Provider Order: No       Mobility Bed Mobility                    Transfers                         Balance                                           ADL either performed or assessed with clinical judgement   ADL                                              Extremity/Trunk Assessment Upper Extremity Assessment LUE Deficits / Details: guaze removed; large blister @ medial aspect of IP; tip of thumb black and hardened; good isolated movemetn of IP; limited MP and CMC movemetn however  functional; kerlex replaced with stretch bandage and secured at wrist. Apparent dysfunction in median n distribution; unable to adduct, oppose thumb/ index/middle, no adduction of fingers, IP flexion index/middle limited but improving; developing tight MP joints and claw hand. Full composite ROM after intervention.encouraged use of foam cube for grip and pinch strengthening            Vision       Perception     Praxis     Communication     Cognition Arousal: Alert Behavior During Therapy: Flat affect Cognition: Cognition impaired             OT - Cognition Comments: slow processing; difficulty following through with ROM for hand at times                          Cueing      Exercises Hand Exercises Wrist Flexion: AROM, Left, 10 reps Wrist Extension: AROM, Left, AAROM, 10 reps Wrist Ulnar Deviation: AROM, AAROM, Left, 10 reps Wrist Radial Deviation: AROM, AAROM, Left, 10 reps Digit Composite Flexion: AROM, Left, 10 reps, Seated Composite Extension: AROM, Left, 10 reps, Seated Digit Composite Abduction: Left, AROM, 10 reps, Seated Digit Composite  Adduction: Left, AAROM, 10 reps, Seated Digit Lifts: Left, AROM, 10 reps, Seated Thumb Abduction: AROM, Left, 10 reps Thumb Adduction: PROM, AAROM, Left, 15 reps Opposition: AAROM, PROM, Left, 5 reps, Seated Other Exercises Other Exercises: prayer stretch x 5, hold x 10 seconds Other Exercises: IPblocking exercises for DIP adn PIP ROM Other Exercises: nerve glides L hand    Shoulder Instructions       General Comments      Pertinent Vitals/ Pain       Pain Assessment Pain Assessment: Faces Faces Pain Scale: Hurts even more Pain Location: abdomen Pain Descriptors / Indicators: Sore, Discomfort, Grimacing, Guarding, Operative site guarding Pain Intervention(s): Limited activity within patient's tolerance, Patient requesting pain meds-RN notified  Home Living                                           Prior Functioning/Environment              Frequency  Min 3X/week        Progress Toward Goals  OT Goals(current goals can now be found in the care plan section)  Progress towards OT goals: Progressing toward goals  Acute Rehab OT Goals OT Goal Formulation: With patient Time For Goal Achievement: 11/18/24 Potential to Achieve Goals: Good ADL Goals Pt Will Perform Upper Body Dressing: with supervision;sitting Pt Will Perform Lower Body Dressing: with min assist;sitting/lateral leans;sit to/from stand Pt Will Transfer to Toilet: with min assist;stand pivot transfer;bedside commode Pt/caregiver will Perform Home Exercise Program: Increased ROM;Increased strength;Both right and left upper extremity;With minimal assist;With written HEP provided Additional ADL Goal #1: Pt will perform bed mobility min A in prep for seated ADLs  Plan      Co-evaluation                 AM-PAC OT 6 Clicks Daily Activity     Outcome Measure   Help from another person eating meals?: A Little Help from another person taking care of personal grooming?: A Little Help from another person toileting, which includes using toliet, bedpan, or urinal?: A Lot Help from another person bathing (including washing, rinsing, drying)?: A Lot Help from another person to put on and taking off regular upper body clothing?: A Lot Help from another person to put on and taking off regular lower body clothing?: A Lot 6 Click Score: 14    End of Session Equipment Utilized During Treatment: Oxygen   OT Visit Diagnosis: Unsteadiness on feet (R26.81);Other abnormalities of gait and mobility (R26.89);Muscle weakness (generalized) (M62.81)   Activity Tolerance Patient tolerated treatment well   Patient Left in bed;with call bell/phone within reach;with bed alarm set   Nurse Communication Mobility status        Time: 8960-8945 OT Time Calculation (min): 15 min  Charges: OT General  Charges $OT Visit: 1 Visit OT Treatments $Therapeutic Exercise: 8-22 mins  Kreg Sink, OT/L   Acute OT Clinical Specialist Acute Rehabilitation Services Pager [phone] Office [phone]   [facility] 11/13/2024, 1:30 PM

## 2024-11-14 LAB — BASIC METABOLIC PANEL WITH GFR
Anion gap: 5 (ref 5–15)
BUN: 20 mg/dL (ref 6–20)
CO2: 35 mmol/L — ABNORMAL HIGH (ref 22–32)
Calcium: 7.9 mg/dL — ABNORMAL LOW (ref 8.9–10.3)
Chloride: 95 mmol/L — ABNORMAL LOW (ref 98–111)
Creatinine, Ser: 0.44 mg/dL (ref 0.44–1.00)
GFR, Estimated: >60 mL/min (ref >60)
Glucose, Bld: 131 mg/dL — ABNORMAL HIGH (ref 70–99)
Potassium: 4.7 mmol/L (ref 3.5–5.1)
Sodium: 135 mmol/L (ref 135–145)

## 2024-11-14 LAB — CBC
HCT: 26.8 % — ABNORMAL LOW (ref 36.0–46.0)
Hemoglobin: 8.2 g/dL — ABNORMAL LOW (ref 12.0–15.0)
MCH: 27.4 pg (ref 26.0–34.0)
MCHC: 30.6 g/dL (ref 30.0–36.0)
MCV: 89.6 fL (ref 80.0–100.0)
Platelets: 749 K/uL — ABNORMAL HIGH (ref 150–400)
RBC: 2.99 MIL/uL — ABNORMAL LOW (ref 3.87–5.11)
RDW: 15.5 % (ref 11.5–15.5)
WBC: 8.4 K/uL (ref 4.0–10.5)
nRBC: 0 % (ref 0.0–0.2)

## 2024-11-14 NOTE — Plan of Care (Signed)
 ?  Problem: Education: ?Goal: Knowledge of General Education information will improve ?Description: Including pain rating scale, medication(s)/side effects and non-pharmacologic comfort measures ?Outcome: Progressing ?  ?Problem: Health Behavior/Discharge Planning: ?Goal: Ability to manage health-related needs will improve ?Outcome: Progressing ?  ?Problem: Coping: ?Goal: Level of anxiety will decrease ?Outcome: Progressing ?  ?

## 2024-11-14 NOTE — Progress Notes (Signed)
 Progress Note  12 Days Post-Op  Subjective: No complaints this morning.  Sitting up in her chair.  Tolerating about 50% of her trays each meal.  Doesn't drink the [facility] as she doesn't like it.  Tolerating cyclical TFs at night.  Objective: Vital signs in last 24 hours: Temp:  [97.5 F (36.4 C)-98.3 F (36.8 C)] 98.1 F (36.7 C) (12/04 0900) Pulse Rate:  [72-93] 93 (12/04 0900) Resp:  [12-17] 12 (12/04 0900) BP: (121-156)/(59-70) 156/64 (12/04 0900) SpO2:  [98 %-100 %] 100 % (12/04 0900) Last BM Date : 11/11/24  Intake/Output from previous day: 12/03 0701 - 12/04 0700 In: 180 [NG/GT:180] Out: -  Intake/Output this shift: Total I/O In: 270 [P.O.:240; NG/GT:30] Out: 250 [Urine:250]  PE: General: NAD Lungs: Respiratory effort nonlabored Abd: soft, nondistended, appropriately ttp, G-tube present with sutures in place, midline wound mostly clean and shallow with small amount fibrinous tissue at superior wound.  VAC DC today and WD dressing replaced.   Lab Results:  Recent Labs    11/12/24 0407 11/14/24 0153  WBC 11.3* 8.4  HGB 8.1* 8.2*  HCT 25.9* 26.8*  PLT 601* 749*   BMET Recent Labs    11/12/24 0405 11/14/24 0153  NA 135 135  K 5.0 4.7  CL 93* 95*  CO2 36* 35*  GLUCOSE 203* 131*  BUN 27* 20  CREATININE 0.43* 0.44  CALCIUM  8.1* 7.9*   PT/INR No results for input(s): LABPROT, INR in the last 72 hours. CMP     Component Value Date/Time   NA 135 11/14/2024 0153   NA 142 09/23/2024 1030   K 4.7 11/14/2024 0153   CL 95 (L) 11/14/2024 0153   CO2 35 (H) 11/14/2024 0153   GLUCOSE 131 (H) 11/14/2024 0153   BUN 20 11/14/2024 0153   BUN 17 09/23/2024 1030   CREATININE 0.44 11/14/2024 0153   CREATININE 0.82 06/18/2013 1009   CALCIUM  7.9 (L) 11/14/2024 0153   PROT 4.1 (L) 11/04/2024 0500   PROT 6.5 06/28/2024 0842   ALBUMIN  <1.5 (L) 11/04/2024 0500   ALBUMIN  4.5 06/28/2024 0842   AST 88 (H) 11/04/2024 0500   ALT 64 (H) 11/04/2024 0500    ALKPHOS 48 11/04/2024 0500   BILITOT 0.8 11/04/2024 0500   BILITOT 0.3 06/28/2024 0842   GFRNONAA >60 11/14/2024 0153   GFRNONAA 87 06/18/2013 1009   GFRAA >60 08/04/2020 1038   GFRAA >89 06/18/2013 1009   Lipase     Component Value Date/Time   LIPASE 22 11/01/2024 0450       Studies/Results: No results found.   Anti-infectives: Anti-infectives (From admission, onward)    Start     Dose/Rate Route Frequency Ordered Stop   10/31/24 1400  piperacillin -tazobactam (ZOSYN ) IVPB 3.375 g        3.375 g 12.5 mL/hr over 240 Minutes Intravenous Every 8 hours 10/31/24 0751 11/08/24 0029   10/31/24 1100  micafungin  (MYCAMINE ) 100 mg in sodium chloride  0.9 % 100 mL IVPB        100 mg 105 mL/hr over 1 Hours Intravenous Every 24 hours 10/31/24 0901 11/04/24 1354   10/30/24 0600  piperacillin -tazobactam (ZOSYN ) IVPB 3.375 g  Status:  Discontinued        3.375 g 12.5 mL/hr over 240 Minutes Intravenous Every 8 hours 10/29/24 2350 10/31/24 0751   10/29/24 2145  piperacillin -tazobactam (ZOSYN ) IVPB 3.375 g        3.375 g 100 mL/hr over 30 Minutes Intravenous  Once 10/29/24 2137  10/29/24 2205   10/29/24 1700  cefTRIAXone  (ROCEPHIN ) 1 g in sodium chloride  0.9 % 100 mL IVPB  Status:  Discontinued        1 g 200 mL/hr over 30 Minutes Intravenous  Once 10/29/24 1646 10/29/24 1656   10/29/24 1700  metroNIDAZOLE  (FLAGYL ) IVPB 500 mg        500 mg 100 mL/hr over 60 Minutes Intravenous  Once 10/29/24 1646 10/29/24 1809   10/29/24 1700  cefTRIAXone  (ROCEPHIN ) 2 g in sodium chloride  0.9 % 100 mL IVPB        2 g 200 mL/hr over 30 Minutes Intravenous  Once 10/29/24 1656 10/29/24 1733        Assessment/Plan POD 16, s/p ex lap with LOA, g-tube placement in gastric remnant Dr. Vernetta 11/18 secondary to SBO with possible perforation POD 14, s/p ex lap with washout, Dr. Tanda 11/20 POD 12, s/p washout, abdominal closure Dr. Tanda 11/22 - DC VAC today and transition to WD dressing changes BID -  soft diet  - Cyclical tube feeds overnight, but patient tells me she is eating about 50% of her meals, but dietitian thought maybe only 25%.  She does not like her Mallie Master so she isn't drinking that.  D/w dietitian, we will try to get a better idea today of what she is actually taking in so we can continue to work on increasing her oral intake and weaning her TFs as we are able.    FEN - soft diet. Ok to wean cyclic TF as PO intake improves, [employer]ie [employer] VTE - eliquis started yesterday  ID - micafungin  5 doses completed; Zosyn  11/20>11/28   - per CCM -  E coli bacteremia -completed 1 week of Zosyn  Septic shock -resolved CAD/Heart failure - EF 20-25% on echo 11/21 AKI - resolved DM - SSI Leukopenia - resolved, secondary to sepsis Thrombocytopenia - resolved Hypocalcemia - per CCM Neck wound - unclear etiology, have asked nursing to place mepilex over this to protect it.  Avoid dressings or lines in this area. L hand ischemic changes -  VVS following, s/p embolectomy radial artery and repair, likely will need more surgery. Hand surgery consulted   DISPO- CIR declined, family working towards SNF.    LOS: 16 days   Burnard FORBES Banter, [facility] 11/14/2024, 11:12 AM Please see Amion for pager number during day hours 7:00am-4:30pm

## 2024-11-14 NOTE — Progress Notes (Signed)
 Mobility Specialist Progress Note:    11/14/24 0930  Mobility  Activity Pivoted/transferred from bed to chair  Level of Assistance Contact guard assist, steadying assist  Assistive Device Front wheel walker  Distance Ambulated (ft) 8 ft  Activity Response Tolerated well  Mobility Referral Yes  Mobility visit 1 Mobility  Mobility Specialist Start Time (ACUTE ONLY) [id number]  Mobility Specialist Stop Time (ACUTE ONLY) 0930  Mobility Specialist Time Calculation (min) (ACUTE ONLY) 14 min   Received pt in bed and eager for OOB. Found pt on 3 L/min. Pt required MinG for safety. Pt c/o abdominal discomfort, otherwise tolerated well. VSS throughout. Left pt in chair. Personal belongings and call light within reach. All needs met.  Lavanda Pollack Mobility Specialist  Please contact via [employer] or  Rehab Office [phone]

## 2024-11-14 NOTE — Consult Note (Addendum)
 WOC Nurse follow-up Consult Note: Vac removed to assess wound with surgical PA at the bedside.  Wound is fairly shallow and Vac was ordered to be discontinued and moist gauze dressings to be applied by bedside nurses BID.  Abd full thickness wound is 80% pale red, 15% yellow, 5% brown at the upper edges.  Small amt pink drainage in the previous cannister. 12X4X.3cm   Surgical team will follow for further assessment and plan of care.  WOC team will continue to assess sacrum wound Q 7-10 days; this was recently performed on 12/2.   Thank-you,  Stephane Fought MSN, RN, CWOCN, CWCN-AP, CNS Contact Mon-Fri 0700-1500: [phone]

## 2024-11-14 NOTE — Progress Notes (Addendum)
 Calorie Count Note  Pt refused Mallie [name] yesterday as she thought it contained whey protein in which pt does not drink. RD reiterated to pt that Mallie [name] is plant based, provided a Mallie [name] to pt in which she drank 25% during visit. No Nausea/vomiting today, tolerating GI soft diet.    Pt is a poor historian, went through dietary recall with pt and she reports he was only eating 25% of meals however nurse tech and other members of the care team report pt is eating 50% of her tray.   Recommend continuing tube feeds until pt meeting at least 75% of her energy needs by po. May still need to supplement with enteral feeds as pt with history of Roue-en-Y which may be affecting absorption as well as abdomen wound.   48 hour calorie count ordered.  Diet: GI Soft, thins Supplements: Mallie [name]   Day 1, 12/3:  Breakfast: 50% of sausage and potatoes  (130 kcal, 3 gm protein).  Lunch: 25% Spaghetti with meat sauce (75 kcal, 3 gm protein) - Per pt report, no meal ticket  Dinner: 25% turkey and mashed potatoes (60 kcal, 4 gm protein) - per pt report, no meal ticket  Supplements: None   Total intake: 265 kcal (14% of minimum estimated needs)  10 gm protein (9%% of minimum estimated needs)  Day 2,: 12/4:  Breakfast: 50% of pancakes and sausage (185 kcal, 5 gm protein) 25% [employer]  Estimated Nutritional Needs:  Kcal:  1900-2100 kcal Protein:  110-130 gm Fluid:  >2L/day  INTERVENTION:  Continue nocturnal feedings via G-tube: Osmolite 1.5 @ 83 ml/hr x 12 hrs overnight from 1800-0600 (1,000 ml)  ProSource TF 20- Give 60ml BID via tube, each supplement provides 80kcal and 20g of protein.  Free water  flushes 30ml q4 hours to maintain tube patency  Regimen provides 1660 kcal/day, 102 g/day protein and 942 ml/day of free water  from formula and FWF   Encourage PO intake - on GI soft diet Assist with meal ordering  Nursing to assist with meals as needed [employer]ms 1.4 PO BID, each supplement  provides 455 kcal and 20 grams protein.  Juven Fruit Punch BID via tube, each serving provides 95kcal and 2.5g of protein (amino acids  glutamine and arginine) MVI daily via tube  Weekly weights  Continue 48 hour calorie count to access intake Vitamin/Mineral Repletion MVI daily po Vitamin C 500mg  po BID x 30 days (ends 12/30) Zinc  220mg  (50mg  elemental) po daily x 30 days (ends 12/08) Copper  repletion ended 11/30, now normal, however CRP elevated which may be falsely increasing copper  levels. May need to recheck copper  and CRP again at a later date. Vitamin A  10,000 units po daily x 30 days (ends 12/31)   NUTRITION DIAGNOSIS:    Severe Malnutrition related to chronic illness as evidenced by moderate muscle depletion, severe muscle depletion, energy intake < or equal to 75% for > or equal to 1 month, edema, percent weight loss. - Ongoing    GOAL:    Patient will meet greater than or equal to 90% of their needs - Meeting via TF  Olivia Kenning, RD Registered Dietitian  See Amion for more information

## 2024-11-14 NOTE — Progress Notes (Signed)
 PROGRESS NOTE    Morgan [name]  FMW:[id number] DOB: 1966-01-25 DOA: 10/29/2024 PCP: [name], DO  Subjective: No acute overnight events. Patient reports having some cramping in her abdomen and thinks it's from the Miralax . States she is eating okay, but doesn't like the nutrition supplement drinks.     Hospital Course: 58 year old female with history of Roux-en-Y gastric bypass who presented to the emergency department with abdominal pain, lactic acidosis and lipase greater than 2800.  She had CCS evaluation and went for exploratory laparotomy.  Found to have dense adhesive band that was obstructing her proximal small bowel as well as biliary leak without evidence of perforation.  She had a gastrostomy tube placed with a wound VAC.  Initially on Levophed , vasopressin , intubated on bicarb drip postoperatively. Patient's septic shock continue to improve, off pressors, NG tube remains in place, postoperative care per general surgery including diet advancement.  Patient completed antibiotics on 11/27, completed antifungals 11/25. Left thumb ischemia status post thrombectomy appears to be stable, vascular surgery following. Patient's chronic comorbid conditions otherwise appear to be improving.   Assessment and Plan:  Septic shock due to perforated SBO with peritonitis, E Coli bacteremia, resolved SBO due to adhesion Status post ex lap with lysis of adhesions 11/18; washouts on 11/20 and abdominal closure on 11/22  - zosyn  completed for ecoli bactermia; micafungin  completed 11/25 for peritonitis  - G-tube in gastric remnant -May be pulled in the next few days per surgery - TPN off as of 11/27, still on cyclic feeds with daytime PO intake - started on 48 hour calorie count to monitor daytime intake and can hopefully dc the nocturnal TF - wound VAC now discontinued as of 12/4    Acute HFrEF- concerning given CAD hx vs septic cardiomyopathy, improving - Off dobutamine  - GDMT on hold  due to hypotension per heart failure team -continue furosemide  - Cardiac cath pending further recovery per cardiology expertise   Critical left hand ischemia due to thrombosed radial artery, s/p thrombectomy - Left radial arterial line discontinued 11/20, status post radial artery embolectomy with vascular surgery  - Allow this to declare then plan for amputation (suspected to take 3-4 weeks) - 12/2 reevaluated by vascular surgery, tip appears to be nonsalvageable but thumb improving - Transition to p.o. DOAC - Staple removal planned 12/10 -notify vascular surgery if patient discharges before this date   Urinary retention, improving Acute kidney injury, resolved - Foley previously removed -currently with external urinary cath while in bed - Strict I/O - Renally dose meds, avoid nephrotoxic meds   Thrombocytopenia due to sepsis, resolved    Acute anemia, recovering - Presumed blood loss vs worsening chronic anemia of chronic disease/aplastic anemia - Hemoglobin improved to 8.6 after transfusion, 1 unit 11/25 - Hb stable in the low 8 range    Acute hypoxic respiratory failure postoperatively RLL PNA- suspect aspiration from SBO - extubated successfully 11/24 - wean O2 for sat >92%  - IS and mobilize as able   Hypoglycemia  H/o DM2 but  A1c 5.3 and not on meds PTA-- resolved after Roux-en-Y? -monitor; not needing insulin  and discontinued   Hypothyroidism - con't synthroid    Elevated transaminases, hyperbilirubinemia - stabilizing  - bilirubin back to normal    Depression/Anxiety  - Increase sertraline  back to home dose - resumed ziprasidone     Pressure injury noted, POA Wound 10/31/24 1716 Pressure Injury Coccyx Medial Deep Tissue Pressure Injury - Purple or maroon localized area of discolored intact skin  or blood-filled blister due to damage of underlying soft tissue from pressure and/or shear. (Active)       DVT prophylaxis: SCD's Start: 10/29/24 2346 apixaban  (ELIQUIS) tablet 5 mg     Code Status: Full Code Disposition Plan: TBD Reason for continuing need for hospitalization: Not med ready   Objective: Vitals:   11/13/24 1833 11/13/24 2057 11/14/24 0455 11/14/24 0900  BP: (!) 126/59 121/65 134/70 (!) 156/64  Pulse: 75 72 83 93  Resp: 17   12  Temp: 98 F (36.7 C) (!) 97.5 F (36.4 C) 98.3 F (36.8 C) 98.1 F (36.7 C)  TempSrc: Oral Oral  Axillary  SpO2: 100% 98% 99% 100%  Weight:      Height:        Intake/Output Summary (Last 24 hours) at 11/14/2024 1434 Last data filed at 11/14/2024 1300 Gross per 24 hour  Intake 450 ml  Output 250 ml  Net 200 ml   Filed Weights   11/08/24 0451 11/10/24 2312 11/12/24 0453  Weight: 71.8 kg 70.9 kg 67.5 kg    Examination:  Physical Exam  Data Reviewed: I have personally reviewed following labs and imaging studies  CBC: Recent Labs  Lab 11/08/24 0501 11/09/24 0424 11/11/24 0430 11/12/24 0407 11/14/24 0153  WBC 10.5 11.2* 13.0* 11.3* 8.4  HGB 7.6* 7.2* 7.4* 8.1* 8.2*  HCT 23.0* 22.2* 23.1* 25.9* 26.8*  MCV 87.8 88.4 90.2 89.3 89.6  PLT 282 323 496* 601* 749*   Basic Metabolic Panel: Recent Labs  Lab 11/08/24 0501 11/09/24 0424 11/10/24 0340 11/11/24 0430 11/12/24 0405 11/14/24 0153  NA 136 132* 133* 134* 135 135  K 4.1 4.2 4.5 4.9 5.0 4.7  CL 98 95* 97* 92* 93* 95*  CO2 28 30 30  37* 36* 35*  GLUCOSE 129* 126* 143* 131* 203* 131*  BUN 14 14 15  23* 27* 20  CREATININE 0.37* 0.43* 0.39* 0.38* 0.43* 0.44  CALCIUM  7.5* 7.4* 7.8* 8.0* 8.1* 7.9*  MG 1.7 2.1  --  1.7  --   --   PHOS  --   --   --  3.9  --   --    GFR: Estimated Creatinine Clearance: 73.2 mL/min (by C-G formula based on SCr of 0.44 mg/dL). Liver Function Tests: No results for input(s): AST, ALT, ALKPHOS, BILITOT, PROT, ALBUMIN  in the last 168 hours. No results for input(s): LIPASE, AMYLASE in the last 168 hours. No results for input(s): AMMONIA in the last 168 hours. Coagulation  Profile: No results for input(s): INR, PROTIME in the last 168 hours. Cardiac Enzymes: No results for input(s): CKTOTAL, CKMB, CKMBINDEX, TROPONINI in the last 168 hours. ProBNP, BNP (last 5 results) Recent Labs    11/01/24 0920  PROBNP >35,000.0*   HbA1C: No results for input(s): HGBA1C in the last 72 hours. CBG: Recent Labs  Lab 11/12/24 1637 11/12/24 2119 11/13/24 0025 11/13/24 0458 11/13/24 0806  GLUCAP 95 163* 127* 156* 173*   Lipid Profile: No results for input(s): CHOL, HDL, LDLCALC, TRIG, CHOLHDL, LDLDIRECT in the last 72 hours. Thyroid  Function Tests: No results for input(s): TSH, T4TOTAL, FREET4, T3FREE, THYROIDAB in the last 72 hours. Anemia Panel: No results for input(s): VITAMINB12, FOLATE, FERRITIN, TIBC, IRON , RETICCTPCT in the last 72 hours. Sepsis Labs: No results for input(s): PROCALCITON, LATICACIDVEN in the last 168 hours.  No results found for this or any previous visit (from the past 240 hours).   Radiology Studies: No results found.  Scheduled Meds:  acetaminophen   650  mg Per Tube Q6H   apixaban   5 mg Oral BID   ascorbic acid   500 mg Oral BID   Chlorhexidine  Gluconate Cloth  6 each Topical Daily   ezetimibe   10 mg Oral Daily   feeding supplement (KATE FARMS STANDARD ENT 1.4)  325 mL Oral BID BM   feeding supplement (OSMOLITE 1.5 CAL)  996 mL Per Tube Q24H   feeding supplement (PROSource TF20)  60 mL Per Tube BID   free water   30 mL Per Tube Q4H   furosemide   40 mg Per Tube Daily   levothyroxine   112 mcg Per Tube QAC breakfast   losartan   25 mg Oral Daily   metoprolol  succinate  12.5 mg Oral QHS   multivitamin with minerals  1 tablet Oral Daily   mouth rinse  15 mL Mouth Rinse 4 times per day   pantoprazole   40 mg Oral BID   polyethylene glycol  17 g Oral BID   sertraline   200 mg Per Tube Daily   vitamin A   10,000 Units Oral Daily   zinc  sulfate (50mg  elemental zinc )  220 mg Per Tube  Daily   ziprasidone   60 mg Oral QHS   Continuous Infusions:   LOS: 16 days   Time spent: 36 minutes  Casimer Dare, MD  [facility]  11/14/2024, 2:34 PM

## 2024-11-15 MED ORDER — LEVOTHYROXINE SODIUM 112 MCG PO TABS
112.0000 ug | ORAL_TABLET | Freq: Every day | ORAL | Status: DC
  Administered 2024-11-15 – 2024-11-18 (×4): 112 ug via ORAL
  Filled 2024-11-15 (×4): qty 1

## 2024-11-15 MED ORDER — OXYCODONE HCL 5 MG PO TABS
5.0000 mg | ORAL_TABLET | ORAL | Status: DC | PRN
  Administered 2024-11-15: 10 mg via ORAL
  Administered 2024-11-15 – 2024-11-16 (×2): 5 mg via ORAL
  Administered 2024-11-16: 10 mg via ORAL
  Administered 2024-11-17: 5 mg via ORAL
  Administered 2024-11-17: 10 mg via ORAL
  Administered 2024-11-17 – 2024-11-18 (×2): 5 mg via ORAL
  Filled 2024-11-15 (×2): qty 1
  Filled 2024-11-15: qty 2
  Filled 2024-11-15: qty 1
  Filled 2024-11-15: qty 2
  Filled 2024-11-15: qty 1
  Filled 2024-11-15: qty 2
  Filled 2024-11-15: qty 1

## 2024-11-15 MED ORDER — SERTRALINE HCL 100 MG PO TABS
200.0000 mg | ORAL_TABLET | Freq: Every day | ORAL | Status: DC
  Administered 2024-11-15 – 2024-11-18 (×4): 200 mg via ORAL
  Filled 2024-11-15 (×4): qty 2

## 2024-11-15 MED ORDER — FUROSEMIDE 40 MG PO TABS
40.0000 mg | ORAL_TABLET | Freq: Every day | ORAL | Status: DC
  Administered 2024-11-15 – 2024-11-18 (×4): 40 mg via ORAL
  Filled 2024-11-15 (×4): qty 1

## 2024-11-15 MED ORDER — POLYETHYLENE GLYCOL 3350 17 G PO PACK: 17.0000 g | PACK | Freq: Every day | ORAL | Status: DC | PRN

## 2024-11-15 MED ORDER — OSMOLITE 1.5 CAL PO LIQD
237.0000 mL | Freq: Two times a day (BID) | ORAL | Status: DC
  Administered 2024-11-15 – 2024-11-18 (×6): 237 mL
  Filled 2024-11-15 (×2): qty 237

## 2024-11-15 MED ORDER — ZINC SULFATE 220 (50 ZN) MG PO CAPS
220.0000 mg | ORAL_CAPSULE | Freq: Every day | ORAL | Status: DC
  Administered 2024-11-15 – 2024-11-18 (×4): 220 mg via ORAL
  Filled 2024-11-15 (×4): qty 1

## 2024-11-15 MED ORDER — SENNOSIDES-DOCUSATE SODIUM 8.6-50 MG PO TABS
1.0000 | ORAL_TABLET | Freq: Every day | ORAL | Status: DC
  Administered 2024-11-15 – 2024-11-17 (×3): 1 via ORAL
  Filled 2024-11-15 (×3): qty 1

## 2024-11-15 MED ORDER — ACETAMINOPHEN 325 MG PO TABS
650.0000 mg | ORAL_TABLET | Freq: Four times a day (QID) | ORAL | Status: DC
  Administered 2024-11-15 – 2024-11-18 (×16): 650 mg via ORAL
  Filled 2024-11-15 (×15): qty 2

## 2024-11-15 NOTE — Hospital Course (Signed)
 Morgan [name] is a 58 year old female with history of Roux-en-Y gastric bypass who presented to the emergency department with abdominal pain, lactic acidosis and lipase greater than 2800.  She had CCS evaluation and went for exploratory laparotomy.  Found to have dense adhesive band that was obstructing her proximal small bowel as well as biliary leak without evidence of perforation.  She had a gastrostomy tube placed with a wound VAC.  Initially on Levophed , vasopressin , intubated on bicarb drip postoperatively. Patient's septic shock continued to improve, off pressors, NG tube remains in place, postoperative care per general surgery including diet advancement.  Patient completed antibiotics on 11/27, completed antifungals 11/25. Left thumb ischemia status post thrombectomy appears to be stable, vascular surgery following. Patient's chronic comorbid conditions otherwise appear to be improving.

## 2024-11-15 NOTE — Plan of Care (Signed)
   Problem: Activity: Goal: Risk for activity intolerance will decrease Outcome: Progressing   Problem: Pain Managment: Goal: General experience of comfort will improve and/or be controlled Outcome: Progressing   Problem: Safety: Goal: Ability to remain free from injury will improve Outcome: Progressing

## 2024-11-15 NOTE — Discharge Instructions (Addendum)
 Supplemental TFs to run nocturnally  WOUND CARE: - midline dressing to be changed daily - supplies: sterile saline, gauze, scissors, tape  - remove dressing and all packing carefully, moistening with sterile saline as needed to avoid packing/internal dressing sticking to the wound. - clean edges of skin around the wound with water /gauze, making sure there is no tape debris or leakage left on skin that could cause skin irritation or breakdown. - dampen and clean gauze with sterile saline and pack wound from wound base to skin level, making sure to take note of any possible areas of wound tracking, tunneling and packing appropriately. Wound can be packed loosely. Trim gauze to size if a whole gauze is not required. - cover wound with a dry gauze and secure with tape.  - write the date/time on the dry dressing/tape to better track when the last dressing change occurred. - change dressing as needed if leakage occurs, wound gets contaminated, or patient requests to shower. - patient may shower daily with wound open (i.e. remove all packing) and following the shower the wound should be dried and a clean dressing placed.    [facility]      [facility], [place] [phone]  OPEN ABDOMINAL SURGERY: POST OP INSTRUCTIONS  Always review your discharge instruction sheet given to you by the facility where your surgery was performed.  IF YOU HAVE DISABILITY OR FAMILY LEAVE FORMS, YOU MUST BRING THEM TO THE OFFICE FOR PROCESSING.  PLEASE DO NOT GIVE THEM TO YOUR DOCTOR.  A prescription for pain medication may be given to you upon discharge.  Take your pain medication as prescribed, if needed.  If narcotic pain medicine is not needed, then you may take acetaminophen  (Tylenol ) or ibuprofen (Advil) as needed. Take your usually prescribed medications unless otherwise directed. If you need a refill on your pain medication, please contact your pharmacy. They will contact our office to request authorization.   Prescriptions will not be filled after 5pm or on week-ends. You should follow a light diet the first few days after arrival home, such as soup and crackers, pudding, etc.unless your doctor has advised otherwise. A high-fiber, low fat diet can be resumed as tolerated.   Be sure to include lots of fluids daily. Most patients will experience some swelling and bruising on the chest and neck area.  Ice packs will help.  Swelling and bruising can take several days to resolve Most patients will experience some swelling and bruising in the area of the incision. Ice pack will help. Swelling and bruising can take several days to resolve..  It is common to experience some constipation if taking pain medication after surgery.  Increasing fluid intake and taking a stool softener will usually help or prevent this problem from occurring.  A mild laxative (Milk of Magnesia or Miralax ) should be taken according to package directions if there are no bowel movements after 48 hours.  You may have steri-strips (small skin tapes) in place directly over the incision.  These strips should be left on the skin for 7-10 days.  If your surgeon used skin glue on the incision, you may shower in 24 hours.  The glue will flake off over the next 2-3 weeks.  Any sutures or staples will be removed at the office during your follow-up visit. You may find that a light gauze bandage over your incision may keep your staples from being rubbed or pulled. You may shower and replace the bandage daily. ACTIVITIES:  You may resume  regular (light) daily activities beginning the next day--such as daily self-care, walking, climbing stairs--gradually increasing activities as tolerated.  You may have sexual intercourse when it is comfortable.  Refrain from any heavy lifting or straining until approved by your doctor. You may drive when you no longer are taking prescription pain medication, you can comfortably wear a seatbelt, and you can safely maneuver your  car and apply brakes Return to Work: ___________________________________ Morgan [name] should see your doctor in the office for a follow-up appointment approximately two weeks after your surgery.  Make sure that you call for this appointment within a day or two after you arrive home to insure a convenient appointment time. OTHER INSTRUCTIONS:  _____________________________________________________________ _____________________________________________________________  WHEN TO CALL YOUR DOCTOR: Fever over 101.0 Inability to urinate Nausea and/or vomiting Extreme swelling or bruising Continued bleeding from incision. Increased pain, redness, or drainage from the incision. Difficulty swallowing or breathing Muscle cramping or spasms. Numbness or tingling in hands or feet or around lips.  The clinic staff is available to answer your questions during regular business hours.  Please don't hesitate to call and ask to speak to one of the nurses if you have concerns.  For further questions, please visit [email or web address]    Information on my medicine - ELIQUIS  (apixaban )  This medication education was reviewed with me or my healthcare representative as part of my discharge preparation.    Why was Eliquis  prescribed for you? Eliquis  was prescribed for you to reduce the risk of forming blood clots that can cause a stroke if you have a medical condition called atrial fibrillation (a type of irregular heartbeat) OR to reduce the risk of a blood clots forming after orthopedic surgery.  What do You need to know about Eliquis  ? Take your Eliquis  TWICE DAILY - one tablet in the morning and one tablet in the evening with or without food.  It would be best to take the doses about the same time each day.  If you have difficulty swallowing the tablet whole please discuss with your pharmacist how to take the medication safely.  Take Eliquis  exactly as prescribed by your doctor and DO NOT stop  taking Eliquis  without talking to the doctor who prescribed the medication.  Stopping may increase your risk of developing a new clot or stroke.  Refill your prescription before you run out.  After discharge, you should have regular check-up appointments with your healthcare provider that is prescribing your Eliquis .  In the future your dose may need to be changed if your kidney function or weight changes by a significant amount or as you get older.  What do you do if you miss a dose? If you miss a dose, take it as soon as you remember on the same day and resume taking twice daily.  Do not take more than one dose of ELIQUIS  at the same time.  Important Safety Information A possible side effect of Eliquis  is bleeding. You should call your healthcare provider right away if you experience any of the following: Bleeding from an injury or your nose that does not stop. Unusual colored urine (red or dark brown) or unusual colored stools (red or black). Unusual bruising for unknown reasons. A serious fall or if you hit your head (even if there is no bleeding).  Some medicines may interact with Eliquis  and might increase your risk of bleeding or clotting while on Eliquis . To help avoid this, consult your healthcare provider or pharmacist prior to using any new  prescription or non-prescription medications, including herbals, vitamins, non-steroidal anti-inflammatory drugs (NSAIDs) and supplements.  This website has more information on Eliquis  (apixaban ): [email or web address] .[email or web address] dena

## 2024-11-15 NOTE — Progress Notes (Addendum)
 Calorie Count Note  Pt resting in bed, doing well. Pt reports she had had 50% of eggs and all of bacon, this morning. Drinking a Mallie [name] on visit. Pt continues to improve, eating fairly well, has an appetite. Still experiences early satiety. Encouraged small frequent meals and drinking 2 [employer] per day. Calorie count yesterday showed pt meeting >59% of her calorie needs and >41% of her protein needs. Pt did not eat dinner yesterday as she did not like it, With dinner pt would have done a lot better with her calorie count.   Discussed with surgery, will transition to Bolus tube feeds via G tube.   48 hour calorie count ordered.  Diet: GI Soft, thins Supplements: Mallie [name]    Day 1, 12/3:  Breakfast: 50% of sausage and potatoes  (130 kcal, 3 gm protein).  Lunch: 25% Spaghetti with meat sauce (75 kcal, 3 gm protein) - Per pt report, no meal ticket  Dinner: 25% turkey and mashed potatoes (60 kcal, 4 gm protein) - per pt report, no meal ticket  Supplements: None    Total intake: 265 kcal (14% of minimum estimated needs)  10 gm protein (9%% of minimum estimated needs)   Day 2,: 12/4:  Breakfast: 50% of pancakes, oatmeal, and sausage (185 kcal, 5 gm protein) Lunch: 50% cheeseburger, 100% chocolate ice cream (343 kcal, 17 gm protein) Dinner: 100% pudding (130 kcal, 3 gm rpotein) Supplements: 1 [employer] (455 kcal, 20 grams protein)   Total intake: 1,113 kcal (59% of minimum estimated needs)  45 gm protein (41%% of minimum estimated needs)   Estimated Nutritional Needs:  Kcal:  1900-2100 kcal Protein:  110-130 gm Fluid:  >2L/day   INTERVENTION:  Bolus tube feedings via G-tube: 1 Container (237 ml) Osmolite 1.5 BID, one at 2200 and one at 0600  ProSource TF 20- Give 60ml BID via tube, each supplement provides 80kcal and 20g of protein.  Free water  flushes 30ml q4 hours to maintain tube patency  Regimen provides 870 kcal/day, 70 g/day protein and 542 ml/day of free water  from  formula and FWF   Encourage PO intake - on GI soft diet Assist with meal ordering  Nursing to assist with meals as needed Mall[employer] Farms 1.4 PO BID, each supplement provides 455 kcal and 20 grams protein.  Juven Fruit Punch BID via tube, each serving provides 95kcal and 2.5g of protein (amino acids  glutamine and arginine) MVI daily via tube  Weekly weights  Vitamin/Mineral Repletion MVI daily po Vitamin C  500mg  po BID x 30 days (ends 12/30) Zinc  220mg  (50mg  elemental) po daily x 30 days (ends 12/08) Copper  repletion ended 11/30, now normal, however CRP elevated which may be falsely increasing copper  levels. May need to recheck copper  and CRP again at a later date. Vitamin A  10,000 units po daily x 30 days (ends 12/31)   NUTRITION DIAGNOSIS:    Severe Malnutrition related to chronic illness as evidenced by moderate muscle depletion, severe muscle depletion, energy intake < or equal to 75% for > or equal to 1 month, edema, percent weight loss. - Ongoing    GOAL:    Patient will meet greater than or equal to 90% of their needs - Meeting via TF   Olivia Kenning, RD Registered Dietitian  See Amion for more information

## 2024-11-15 NOTE — Progress Notes (Signed)
 Patient request for [employer] supplement to be administer in G-tube. Notified Dr. Casimer Dare via secure chat and per MD Mallie [name] will continue to be orally. Informed patient and patient said OK she will continue to drink [employer] supplement.

## 2024-11-15 NOTE — Progress Notes (Signed)
 Occupational Therapy Treatment Patient Details Name: Morgan [name] MRN: [id number] DOB: 1966-01-24 Today's Date: 11/15/2024   History of present illness 58 yo F adm 10/29/24 with SBO s/p ex lap with g-tube placement same date. 11/20 and 11/22 repeat ex lap with VAC. VDRF 11/18-11/24. Pressor support with thrombosed radial artery s/p Lt radial artery embolectomy11/22. PMH: SBO, gastric bypass, DM, OSA, HLD, hypotension, bradycardia, Lt TSA   OT comments  Pt making progress with ROM and strength L hand. Pt able to return demonstrate new exercises focusing on thumb abduction/extension and opposition. Continue to recommend post acute rehab. Acute OT to follow.       If plan is discharge home, recommend the following:  Two people to help with walking and/or transfers;A lot of help with bathing/dressing/bathroom;Assistance with cooking/housework;Direct supervision/assist for medications management;Direct supervision/assist for financial management;Assist for transportation;Help with stairs or ramp for entrance   Equipment Recommendations  Other (comment)    Recommendations for Other Services      Precautions / Restrictions Precautions Precautions: Fall;Other (comment) Recall of Precautions/Restrictions: Impaired Required Braces or Orthoses: Other Brace Other Brace: L intrinsic plus resting hand splint - night wear only Restrictions Weight Bearing Restrictions Per Provider Order: No       Mobility                       Balance                                           ADL either performed or assessed with clinical judgement   ADL   Eating/Feeding: Independent   Grooming: Set up                                      Extremity/Trunk Assessment    Improved movement in median nerve distribution; continue to show weakness but able to oppose thumb to index finger actively; skin around thumb from MP to IP becoming more leathery feeling -  stressed importance of thumb CMC, MP and IP ROM. Pt verbalized understanding.   Pt tolerating wearing splint at night without issues        Vision       Perception     Praxis     Communication     Cognition                                              Cueing      Exercises Hand Exercises Wrist Flexion: AROM, Left, 10 reps Wrist Extension: AROM, Left, AAROM, 10 reps Wrist Ulnar Deviation: AROM, AAROM, Left, 10 reps Wrist Radial Deviation: AROM, AAROM, Left, 10 reps Digit Composite Flexion: AROM, Left, 10 reps, Seated Composite Extension: AROM, Left, 10 reps, Seated Digit Composite Abduction: Left, AROM, 10 reps, Seated Digit Composite Adduction: Left, AAROM, 10 reps, Seated Digit Lifts: Left, AROM, 10 reps, Seated Thumb Abduction: AROM, Left, 10 reps Thumb Adduction: PROM, AAROM, Left, 15 reps Opposition: AAROM, PROM, Left, 5 reps, Seated Other Exercises Other Exercises: prayer stretch x 5, hold x 10 seconds Other Exercises: IPblocking exercises for DIP and PIP ROM - all digits Other Exercises: nerve glides L hand Other Exercises: webspace  stretch    Shoulder Instructions       General Comments dressing changed. Tip of thumb balck; skin around thumb, especially medial aspect is tighter; blister intact; minimal drainage noted; rewrapped with non-bulky dressing around thumb using Kerlex. secured at wrist.    Pertinent Vitals/ Pain       Pain Assessment Pain Assessment: No/denies pain  Home Living                                          Prior Functioning/Environment              Frequency  Min 3X/week        Progress Toward Goals  OT Goals(current goals can now be found in the care plan section)  Progress towards OT goals: Progressing toward goals  Acute Rehab OT Goals Patient Stated Goal: get stronger OT Goal Formulation: With patient Time For Goal Achievement: 11/18/24 Potential to Achieve Goals: Good ADL  Goals Pt Will Perform Upper Body Dressing: with supervision;sitting Pt Will Perform Lower Body Dressing: with min assist;sitting/lateral leans;sit to/from stand Pt Will Transfer to Toilet: with min assist;stand pivot transfer;bedside commode Pt/caregiver will Perform Home Exercise Program: Increased ROM;Increased strength;Both right and left upper extremity;With minimal assist;With written HEP provided Additional ADL Goal #1: Pt will perform bed mobility min A in prep for seated ADLs  Plan      Co-evaluation                 AM-PAC OT 6 Clicks Daily Activity     Outcome Measure   Help from another person eating meals?: None Help from another person taking care of personal grooming?: A Little Help from another person toileting, which includes using toliet, bedpan, or urinal?: A Little Help from another person bathing (including washing, rinsing, drying)?: A Little Help from another person to put on and taking off regular upper body clothing?: A Little Help from another person to put on and taking off regular lower body clothing?: A Lot 6 Click Score: 18    End of Session Equipment Utilized During Treatment: Oxygen   OT Visit Diagnosis: Unsteadiness on feet (R26.81);Other abnormalities of gait and mobility (R26.89);Muscle weakness (generalized) (M62.81)   Activity Tolerance Patient tolerated treatment well   Patient Left in bed;with call bell/phone within reach;with bed alarm set   Nurse Communication Other (comment)        Time: 8783-8763 OT Time Calculation (min): 20 min  Charges: OT General Charges $OT Visit: 1 Visit OT Treatments $Therapeutic Exercise: 8-22 mins  Kreg Sink, OT/L   Acute OT Clinical Specialist Acute Rehabilitation Services Pager [phone] Office [phone]   [facility] 11/15/2024, 1:08 PM

## 2024-11-15 NOTE — Plan of Care (Signed)
  Problem: Clinical Measurements: Goal: Respiratory complications will improve Outcome: Progressing   Problem: Elimination: Goal: Will not experience complications related to bowel motility Outcome: Progressing   Problem: Pain Managment: Goal: General experience of comfort will improve and/or be controlled Outcome: Progressing   Problem: Safety: Goal: Ability to remain free from injury will improve Outcome: Progressing   Problem: Coping: Goal: Ability to adjust to condition or change in health will improve Outcome: Progressing   Problem: Metabolic: Goal: Ability to maintain appropriate glucose levels will improve Outcome: Progressing

## 2024-11-15 NOTE — TOC Progression Note (Signed)
 Transition of Care [facility]) - Progression Note    Patient Details  Name: Morgan [name] MRN: [id number] Date of Birth: 04-09-1966  [facility] CM/SW Contact  Anice Wilshire LITTIE [place], [place] Phone Number: 11/15/2024, 1:39 PM  Clinical Narrative:    CSW intiated insurance auth process and received auth approval for [facility]. Auth ID [id number], effective 12/6-12/9. CSW contacted facility to confirm bed availability. Facility does not currently have a bed open but stated they will contact CSW if one becomes available before pt auth expires. CSW will continue to follow.   Expected Discharge Plan: Home/Self Care Barriers to Discharge: Continued Medical Work up               Expected Discharge Plan and Services In-house Referral:  (rw) Discharge Planning Services: CM Consult   Living arrangements for the past 2 months: Single Family Home                                       Social Drivers of Health (SDOH) Interventions SDOH Screenings   Food Insecurity: Patient Unable To Answer (11/02/2024)  Housing: Patient Unable To Answer (11/02/2024)  Transportation Needs: Patient Unable To Answer (11/02/2024)  Utilities: Patient Unable To Answer (11/02/2024)  Alcohol  Screen: Low Risk  (09/23/2024)  Depression (PHQ2-9): Medium Risk (06/28/2024)  Financial Resource Strain: Medium Risk (09/23/2024)  Physical Activity: Insufficiently Active (09/23/2024)  Social Connections: Patient Unable To Answer (11/02/2024)  Recent Concern: Social Connections - Moderately Isolated (09/23/2024)  Stress: Stress Concern Present (09/23/2024)  Tobacco Use: Low Risk  (10/29/2024)  Health Literacy: Adequate Health Literacy (09/23/2024)    Readmission Risk Interventions     No data to display

## 2024-11-15 NOTE — Progress Notes (Signed)
 Physical Therapy Treatment Patient Details Name: Morgan [name] MRN: [id number] DOB: June 05, 1966 Today's Date: 11/15/2024   History of Present Illness 58 yo F adm 10/29/24 with SBO s/p ex lap with g-tube placement same date. 11/20 and 11/22 repeat ex lap with VAC. VDRF 11/18-11/24. Pressor support with thrombosed radial artery s/p Lt radial artery embolectomy11/22. PMH: SBO, gastric bypass, DM, OSA, HLD, hypotension, bradycardia, Lt TSA    PT Comments  Pt pleasant and eager for mobility and demonstrating great progress towards acute goals this session. Pt awake and alert, making appropriate conversation throughout session. Pt performing transfers sit<>stand with min A fading to CGA with good recall for hand placement and good power up with no noted posterior bias this session. Pt progressing ambulation with RW for support and up to min A to maintain balance with chair follow provided for safety as pt does fatigue quickly. Pt performing x2 bouts of ~60' each. Pt up in chair at end of session with all needs met. Continued to encourage nutrition throughout day and importance of frequent mobilization to maximize functional mobility gains. Pt continues to benefit from skilled PT services to progress toward functional mobility goals.     If plan is discharge home, recommend the following: Assist for transportation;Assistance with cooking/housework;Help with stairs or ramp for entrance;A little help with bathing/dressing/bathroom;A lot of help with walking and/or transfers;Supervision due to cognitive status   Can travel by private vehicle     No  Equipment Recommendations  BSC/3in1;Rollator (4 wheels)    Recommendations for Other Services       Precautions / Restrictions Precautions Precautions: Fall;Other (comment) Recall of Precautions/Restrictions: Impaired Required Braces or Orthoses: Other Brace Other Brace: L intrinsic plus resting hand splint - night wear only Restrictions Weight  Bearing Restrictions Per Provider Order: No     Mobility  Bed Mobility Overal bed mobility: Needs Assistance             General bed mobility comments: pt up on BSC on arrival and in chair at end of session    Transfers Overall transfer level: Needs assistance Equipment used: Rolling walker (2 wheels) Transfers: Sit to/from Stand, Bed to chair/wheelchair/BSC Sit to Stand: Min assist, Contact guard assist           General transfer comment: min A to steady on rise from [name], CGA to stand from recliner    Ambulation/Gait Ambulation/Gait assistance: Min assist (with chair follow) Gait Distance (Feet): 60 Feet (x2) Assistive device: Rolling walker (2 wheels) Gait Pattern/deviations: Step-through pattern, Decreased stride length, Shuffle, Trunk flexed Gait velocity: decr     General Gait Details: pt ambulating with low clearance and shuffling steps. cues for increased BOS and upright posture. chair follow for safety with pt needing x1 seated rest. no LOB   Stairs             Wheelchair Mobility     Tilt Bed    Modified Rankin (Stroke Patients Only)       Balance Overall balance assessment: Needs assistance Sitting-balance support: Feet supported, Single extremity supported, No upper extremity supported Sitting balance-Leahy Scale: Fair     Standing balance support: Bilateral upper extremity supported Standing balance-Leahy Scale: Poor Standing balance comment: reliant on BUE support in standing                            Communication Communication Communication: No apparent difficulties  Cognition Arousal: Alert Behavior During Therapy: [name]  for tasks assessed/performed                             Following commands: Intact      Cueing Cueing Techniques: Verbal cues, Tactile cues  Exercises      General Comments General comments (skin integrity, edema, etc.): dressing changed. Tip of thumb balck; skin around thumb,  especially medial aspect is tighter; blister intact; minimal drainage noted; rewrapped with non-bulky dressing around thumb using Kerlex. secured at wrist.      Pertinent Vitals/Pain Pain Assessment Pain Assessment: No/denies pain    Home Living                          Prior Function            PT Goals (current goals can now be found in the care plan section) Acute Rehab PT Goals PT Goal Formulation: Patient unable to participate in goal setting Time For Goal Achievement: 11/18/24 Progress towards PT goals: Progressing toward goals    Frequency    Min 3X/week      PT Plan      Co-evaluation              AM-PAC PT 6 Clicks Mobility   Outcome Measure  Help needed turning from your back to your side while in a flat bed without using bedrails?: A Little Help needed moving from lying on your back to sitting on the side of a flat bed without using bedrails?: A Lot Help needed moving to and from a bed to a chair (including a wheelchair)?: A Lot Help needed standing up from a chair using your arms (e.g., wheelchair or bedside chair)?: A Little Help needed to walk in hospital room?: A Little Help needed climbing 3-5 steps with a railing? : Total 6 Click Score: 14    End of Session   Activity Tolerance: Patient tolerated treatment well;Patient limited by fatigue Patient left: in chair;with call bell/phone within reach;with chair alarm set Nurse Communication: Mobility status PT Visit Diagnosis: Other abnormalities of gait and mobility (R26.89);Muscle weakness (generalized) (M62.81);Difficulty in walking, not elsewhere classified (R26.2)     Time: 8554-8496 PT Time Calculation (min) (ACUTE ONLY): 18 min  Charges:    $Gait Training: 8-22 mins PT General Charges $$ ACUTE PT VISIT: 1 Visit                     Morgan [name] R. PTA Acute Rehabilitation Services Office: [phone]   Morgan [name] 11/15/2024, 3:59 PM

## 2024-11-15 NOTE — Progress Notes (Signed)
 Pt unavailable for am line care

## 2024-11-15 NOTE — Progress Notes (Signed)
 PROGRESS NOTE    Morgan [name]  FMW:[id number] DOB: 1966/01/02 DOA: 10/29/2024 PCP: [name], DO  Subjective: Patient reports that her cramping pain is better today, has been having regular BMs. Overall she feels stable     Hospital Course: 58 year old female with history of Roux-en-Y gastric bypass who presented to the emergency department with abdominal pain, lactic acidosis and lipase greater than 2800.  She had CCS evaluation and went for exploratory laparotomy.  Found to have dense adhesive band that was obstructing her proximal small bowel as well as biliary leak without evidence of perforation.  She had a gastrostomy tube placed with a wound VAC.  Initially on Levophed , vasopressin , intubated on bicarb drip postoperatively. Patient's septic shock continue to improve, off pressors, NG tube remains in place, postoperative care per general surgery including diet advancement.  Patient completed antibiotics on 11/27, completed antifungals 11/25. Left thumb ischemia status post thrombectomy appears to be stable, vascular surgery following. Patient's chronic comorbid conditions otherwise appear to be improving.   Assessment and Plan: Septic shock due to perforated SBO with peritonitis, E Coli bacteremia, resolved SBO due to adhesion Status post ex lap with lysis of adhesions 11/18; washouts on 11/20 and abdominal closure on 11/22  - zosyn  completed for ecoli bactermia; micafungin  completed 11/25 for peritonitis  - G-tube in gastric remnant  - TPN off as of 11/27, adjusting tube feeds to promote daytime intake  - wound VAC discontinued as of 12/4    Acute HFrEF- concerning given CAD hx vs septic cardiomyopathy, improving - Off dobutamine  - GDMT on hold due to hypotension per heart failure team -continue furosemide  - Cardiac cath pending further recovery per cardiology expertise   Critical left hand ischemia due to thrombosed radial artery, s/p thrombectomy - Left radial  arterial line discontinued 11/20, status post radial artery embolectomy with vascular surgery  - Allow this to declare then plan for amputation (suspected to take 3-4 weeks) - 12/2 reevaluated by vascular surgery, tip appears to be nonsalvageable but thumb improving - Transitioned to p.o. DOAC - Staple removal planned 12/10 -notify vascular surgery if patient discharges before this date   Urinary retention, improving Acute kidney injury, resolved - Foley previously removed -currently with external urinary cath while in bed   Thrombocytopenia due to sepsis - resolved    Acute anemia, recovering - Presumed blood loss vs worsening chronic anemia of chronic disease/aplastic anemia - Hemoglobin improved to 8.6 after transfusion, 1 unit 11/25 - Hb stable in the low 8 range    Acute hypoxic respiratory failure postoperatively RLL PNA- suspect aspiration from SBO - extubated successfully 11/24 - wean O2 for sat >92%  - IS and mobilize as able   Hypoglycemia  H/o DM2 but  A1c 5.3 and not on meds PTA-- resolved after Roux-en-Y? -monitor; not needing insulin  and discontinued   Hypothyroidism - con't synthroid    Elevated transaminases, hyperbilirubinemia - bilirubin back to normal    Depression/Anxiety  - Increase sertraline  back to home dose - resumed ziprasidone     Pressure injury noted, POA Wound 10/31/24 1716 Pressure Injury Coccyx Medial Deep Tissue Pressure Injury - Purple or maroon localized area of discolored intact skin or blood-filled blister due to damage of underlying soft tissue from pressure and/or shear. (Active)     DVT prophylaxis: SCD's Start: 10/29/24 2346 apixaban  (ELIQUIS ) tablet 5 mg     Code Status: Full Code Disposition Plan: SNF Reason for continuing need for hospitalization: med ready at this point  Objective: Vitals:   11/14/24 1638 11/14/24 2007 11/15/24 0357 11/15/24 1126  BP: 112/62 117/67 (!) 141/72 125/63  Pulse: 80 73 74 90  Resp: 14 17 16  17   Temp: 98 F (36.7 C) 98 F (36.7 C) (!) 97.4 F (36.3 C) 97.7 F (36.5 C)  TempSrc: Oral Oral Oral Oral  SpO2: 97% 98% 99% 100%  Weight:      Height:        Intake/Output Summary (Last 24 hours) at 11/15/2024 1510 Last data filed at 11/15/2024 1218 Gross per 24 hour  Intake 912 ml  Output 1400 ml  Net -488 ml   Filed Weights   11/08/24 0451 11/10/24 2312 11/12/24 0453  Weight: 71.8 kg 70.9 kg 67.5 kg    Examination:  Physical Exam Vitals and nursing note reviewed.  Constitutional:      General: She is not in acute distress. Cardiovascular:     Rate and Rhythm: Normal rate.  Pulmonary:     Effort: No respiratory distress.  Abdominal:     General: There is no distension.     Palpations: Abdomen is soft.     Comments: PEG in place     Data Reviewed: I have personally reviewed following labs and imaging studies  CBC: Recent Labs  Lab 11/09/24 0424 11/11/24 0430 11/12/24 0407 11/14/24 0153  WBC 11.2* 13.0* 11.3* 8.4  HGB 7.2* 7.4* 8.1* 8.2*  HCT 22.2* 23.1* 25.9* 26.8*  MCV 88.4 90.2 89.3 89.6  PLT 323 496* 601* 749*   Basic Metabolic Panel: Recent Labs  Lab 11/09/24 0424 11/10/24 0340 11/11/24 0430 11/12/24 0405 11/14/24 0153  NA 132* 133* 134* 135 135  K 4.2 4.5 4.9 5.0 4.7  CL 95* 97* 92* 93* 95*  CO2 30 30 37* 36* 35*  GLUCOSE 126* 143* 131* 203* 131*  BUN 14 15 23* 27* 20  CREATININE 0.43* 0.39* 0.38* 0.43* 0.44  CALCIUM  7.4* 7.8* 8.0* 8.1* 7.9*  MG 2.1  --  1.7  --   --   PHOS  --   --  3.9  --   --    GFR: Estimated Creatinine Clearance: 73.2 mL/min (by C-G formula based on SCr of 0.44 mg/dL). Liver Function Tests: No results for input(s): AST, ALT, ALKPHOS, BILITOT, PROT, ALBUMIN  in the last 168 hours. No results for input(s): LIPASE, AMYLASE in the last 168 hours. No results for input(s): AMMONIA in the last 168 hours. Coagulation Profile: No results for input(s): INR, PROTIME in the last 168 hours. Cardiac  Enzymes: No results for input(s): CKTOTAL, CKMB, CKMBINDEX, TROPONINI in the last 168 hours. ProBNP, BNP (last 5 results) Recent Labs    11/01/24 0920  PROBNP >35,000.0*   HbA1C: No results for input(s): HGBA1C in the last 72 hours. CBG: Recent Labs  Lab 11/12/24 1637 11/12/24 2119 11/13/24 0025 11/13/24 0458 11/13/24 0806  GLUCAP 95 163* 127* 156* 173*   Lipid Profile: No results for input(s): CHOL, HDL, LDLCALC, TRIG, CHOLHDL, LDLDIRECT in the last 72 hours. Thyroid  Function Tests: No results for input(s): TSH, T4TOTAL, FREET4, T3FREE, THYROIDAB in the last 72 hours. Anemia Panel: No results for input(s): VITAMINB12, FOLATE, FERRITIN, TIBC, IRON , RETICCTPCT in the last 72 hours. Sepsis Labs: No results for input(s): PROCALCITON, LATICACIDVEN in the last 168 hours.  No results found for this or any previous visit (from the past 240 hours).   Radiology Studies: No results found.  Scheduled Meds:  acetaminophen   650 mg Oral Q6H   apixaban   5 mg Oral BID   ascorbic acid   500 mg Oral BID   Chlorhexidine  Gluconate Cloth  6 each Topical Daily   ezetimibe   10 mg Oral Daily   feeding supplement (KATE FARMS STANDARD ENT 1.4)  325 mL Oral BID BM   feeding supplement (OSMOLITE 1.5 CAL)  237 mL Per Tube BID   feeding supplement (PROSource TF20)  60 mL Per Tube BID   free water   30 mL Per Tube Q4H   furosemide   40 mg Oral Daily   levothyroxine   112 mcg Oral QAC breakfast   losartan   25 mg Oral Daily   metoprolol  succinate  12.5 mg Oral QHS   multivitamin with minerals  1 tablet Oral Daily   pantoprazole   40 mg Oral BID   senna-docusate  1 tablet Oral QHS   sertraline   200 mg Oral Daily   vitamin A   10,000 Units Oral Daily   zinc  sulfate (50mg  elemental zinc )  220 mg Oral Daily   ziprasidone   60 mg Oral QHS   Continuous Infusions:   LOS: 17 days   Time spent: 36 minutes  Casimer Dare, MD  [facility]  11/15/2024,  3:10 PM

## 2024-11-15 NOTE — Progress Notes (Signed)
 Progress Note  13 Days Post-Op  Subjective: No complaints this morning.  Doing well with her diet.  She ate at least 50% of most meals yesterday.  Not much at dinner as she didn't like it.  Ate 3 pieces of bacon this morning and almost all of her eggs.  Drank a full [employer] as well.  Objective: Vital signs in last 24 hours: Temp:  [97.4 F (36.3 C)-98 F (36.7 C)] 97.4 F (36.3 C) (12/05 0357) Pulse Rate:  [73-80] 74 (12/05 0357) Resp:  [14-17] 16 (12/05 0357) BP: (112-141)/(62-72) 141/72 (12/05 0357) SpO2:  [97 %-99 %] 99 % (12/05 0357) Last BM Date : 11/14/24  Intake/Output from previous day: 12/04 0701 - 12/05 0700 In: 1212 [P.O.:240; NG/GT:942] Out: 1250 [Urine:1250] Intake/Output this shift: No intake/output data recorded.  PE: General: NAD Lungs: Respiratory effort nonlabored Abd: soft, nondistended, appropriately ttp, G-tube present with sutures in place, midline wound looks even better today.  Good pink healthy tissue.  Less pale yellow/fibrin tissue at the superior aspect of her wound.   Lab Results:  Recent Labs    11/14/24 0153  WBC 8.4  HGB 8.2*  HCT 26.8*  PLT 749*   BMET Recent Labs    11/14/24 0153  NA 135  K 4.7  CL 95*  CO2 35*  GLUCOSE 131*  BUN 20  CREATININE 0.44  CALCIUM  7.9*   PT/INR No results for input(s): LABPROT, INR in the last 72 hours. CMP     Component Value Date/Time   NA 135 11/14/2024 0153   NA 142 09/23/2024 1030   K 4.7 11/14/2024 0153   CL 95 (L) 11/14/2024 0153   CO2 35 (H) 11/14/2024 0153   GLUCOSE 131 (H) 11/14/2024 0153   BUN 20 11/14/2024 0153   BUN 17 09/23/2024 1030   CREATININE 0.44 11/14/2024 0153   CREATININE 0.82 06/18/2013 1009   CALCIUM  7.9 (L) 11/14/2024 0153   PROT 4.1 (L) 11/04/2024 0500   PROT 6.5 06/28/2024 0842   ALBUMIN  <1.5 (L) 11/04/2024 0500   ALBUMIN  4.5 06/28/2024 0842   AST 88 (H) 11/04/2024 0500   ALT 64 (H) 11/04/2024 0500   ALKPHOS 48 11/04/2024 0500   BILITOT 0.8  11/04/2024 0500   BILITOT 0.3 06/28/2024 0842   GFRNONAA >60 11/14/2024 0153   GFRNONAA 87 06/18/2013 1009   GFRAA >60 08/04/2020 1038   GFRAA >89 06/18/2013 1009   Lipase     Component Value Date/Time   LIPASE 22 11/01/2024 0450       Studies/Results: No results found.   Anti-infectives: Anti-infectives (From admission, onward)    Start     Dose/Rate Route Frequency Ordered Stop   10/31/24 1400  piperacillin -tazobactam (ZOSYN ) IVPB 3.375 g        3.375 g 12.5 mL/hr over 240 Minutes Intravenous Every 8 hours 10/31/24 0751 11/08/24 0029   10/31/24 1100  micafungin  (MYCAMINE ) 100 mg in sodium chloride  0.9 % 100 mL IVPB        100 mg 105 mL/hr over 1 Hours Intravenous Every 24 hours 10/31/24 0901 11/04/24 1354   10/30/24 0600  piperacillin -tazobactam (ZOSYN ) IVPB 3.375 g  Status:  Discontinued        3.375 g 12.5 mL/hr over 240 Minutes Intravenous Every 8 hours 10/29/24 2350 10/31/24 0751   10/29/24 2145  piperacillin -tazobactam (ZOSYN ) IVPB 3.375 g        3.375 g 100 mL/hr over 30 Minutes Intravenous  Once 10/29/24 2137 10/29/24 2205  10/29/24 1700  cefTRIAXone  (ROCEPHIN ) 1 g in sodium chloride  0.9 % 100 mL IVPB  Status:  Discontinued        1 g 200 mL/hr over 30 Minutes Intravenous  Once 10/29/24 1646 10/29/24 1656   10/29/24 1700  metroNIDAZOLE  (FLAGYL ) IVPB 500 mg        500 mg 100 mL/hr over 60 Minutes Intravenous  Once 10/29/24 1646 10/29/24 1809   10/29/24 1700  cefTRIAXone  (ROCEPHIN ) 2 g in sodium chloride  0.9 % 100 mL IVPB        2 g 200 mL/hr over 30 Minutes Intravenous  Once 10/29/24 1656 10/29/24 1733        Assessment/Plan POD 17, s/p ex lap with LOA, g-tube placement in gastric remnant Dr. Vernetta 11/18 secondary to SBO with possible perforation POD 15, s/p ex lap with washout, Dr. Tanda 11/20 POD 13, s/p washout, abdominal closure Dr. Tanda 11/22 - WD dressing changes BID to midline wound, looks much better - soft diet  - Cyclical tube feeds  overnight, patient is doing much better with her diet and supplement intake. Would like to half rate her TFs today and then keep them here until she follows up with us  in the office for further weaning, etc.      FEN - soft diet. Would like to half rate TFs today, [employer] VTE - eliquis   ID - micafungin  5 doses completed; Zosyn  11/20>11/28   - per CCM -  E coli bacteremia -completed 1 week of Zosyn  Septic shock -resolved CAD/Heart failure - EF 20-25% on echo 11/21 AKI - resolved DM - SSI Leukopenia - resolved, secondary to sepsis Thrombocytopenia - resolved Hypocalcemia - per CCM Neck wound - unclear etiology, have asked nursing to place mepilex over this to protect it.  Avoid dressings or lines in this area. L hand ischemic changes -  VVS following, s/p embolectomy radial artery and repair, likely will need more surgery. Hand surgery consulted   DISPO- surgically stable for SNF from our standpoint   LOS: 17 days   Burnard FORBES Banter, [facility] 11/15/2024, 10:54 AM Please see Amion for pager number during day hours 7:00am-4:30pm

## 2024-11-16 DIAGNOSIS — K56609 Unspecified intestinal obstruction, unspecified as to partial versus complete obstruction: Secondary | ICD-10-CM | POA: Diagnosis not present

## 2024-11-16 LAB — BASIC METABOLIC PANEL WITH GFR
Anion gap: 6 (ref 5–15)
BUN: 27 mg/dL — ABNORMAL HIGH (ref 6–20)
CO2: 32 mmol/L (ref 22–32)
Calcium: 8.2 mg/dL — ABNORMAL LOW (ref 8.9–10.3)
Chloride: 99 mmol/L (ref 98–111)
Creatinine, Ser: 0.41 mg/dL — ABNORMAL LOW (ref 0.44–1.00)
GFR, Estimated: >60 mL/min (ref >60)
Glucose, Bld: 124 mg/dL — ABNORMAL HIGH (ref 70–99)
Potassium: 4.1 mmol/L (ref 3.5–5.1)
Sodium: 137 mmol/L (ref 135–145)

## 2024-11-16 NOTE — Progress Notes (Signed)
 Occupational Therapy Note Functional ROM of L hand continues to improve. Increasing ability to complete self care. Acute Ot to follow. Patient will benefit from continued inpatient follow up therapy, <3 hours/day     11/16/24 1800  OT Visit Information  Last OT Received On 11/16/24  Assistance Needed +1  History of Present Illness 58 yo F adm 10/29/24 with SBO s/p ex lap with g-tube placement same date. 11/20 and 11/22 repeat ex lap with VAC. VDRF 11/18-11/24. Pressor support with thrombosed radial artery s/p Lt radial artery embolectomy11/22. PMH: SBO, gastric bypass, DM, OSA, HLD, hypotension, bradycardia, Lt TSA  Precautions  Precautions Fall;Other (comment)  Recall of Precautions/Restrictions Intact  Required Braces or Orthoses Other Brace  Other Brace L intrinsic plus resting hand splint - night wear only  Pain Assessment  Pain Assessment Faces  Faces Pain Scale 2  Pain Location abdomen; L digit ROM  Pain Descriptors / Indicators Discomfort  Pain Intervention(s) Limited activity within patient's tolerance  Upper Extremity Assessment  Upper Extremity Assessment LUE deficits/detail  LUE Deficits / Details guaze removed; large blister @ medial/dorsal aspect of IP; tip of thumb black and hardened; good isolated movement of IP; MP and CMC movement improves after ROM - increasing ability to oppose digits; kerlex replaced and secured at wrist. Apparent dysfunction in median n distribution but iimproving - less claw hand positioning with extension - digit adduction improving; composite flexion index tight however after intervention - full ROM; apparent intrinsic weakness on.encouraged use of foam cube for grip and pinch; using blue tubing for wrist/hand strengthening  LUE Coordination decreased fine motor  Hand Exercises  Forearm Supination AROM;Left;15 reps  Forearm Pronation AROM;Left;15 reps  Wrist Flexion AROM;Left;10 reps  Wrist Extension AROM;Left;AAROM;10 reps  Wrist Ulnar Deviation  AROM;AAROM;Left;10 reps  Wrist Radial Deviation AROM;AAROM;Left;10 reps  Digit Composite Flexion AROM;Left;10 reps;Seated  Composite Extension AROM;Left;10 reps;Seated  Digit Composite Abduction Left;AROM;10 reps;Seated  Digit Composite Adduction Left;AAROM;10 reps;Seated  Digit Lifts Left;AROM;10 reps;Seated  Thumb Abduction AROM;Left;10 reps  Thumb Adduction PROM;AAROM;Left;15 reps  Opposition AAROM;PROM;Left;5 reps;Seated  Other Exercises  Other Exercises prayer stretch x 5, hold x 10 seconds  Other Exercises IPblocking exercises for DIP and PIP ROM - all digits  Other Exercises nerve glides L hand  Other Exercises webspace stretch  OT - End of Session  Activity Tolerance Patient tolerated treatment well  Patient left in bed;with call bell/phone within reach;Other (comment);with bed alarm set (NT working on her hair)  Nurse Communication Mobility status;Other (comment) (encourage ROM; pt wearing splint at night)  OT Assessment/Plan  OT Visit Diagnosis Unsteadiness on feet (R26.81);Other abnormalities of gait and mobility (R26.89);Muscle weakness (generalized) (M62.81)  OT Frequency (ACUTE ONLY) Min 3X/week  Recommendations for Other Services PT consult;Rehab consult  Follow Up Recommendations Skilled nursing-short term rehab (<3 hours/day)  Patient can return home with the following Assistance with cooking/housework;Direct supervision/assist for medications management;Direct supervision/assist for financial management;Assist for transportation;Help with stairs or ramp for entrance;A little help with walking and/or transfers;A little help with bathing/dressing/bathroom  OT Equipment Other (comment)  AM-PAC OT 6 Clicks Daily Activity Outcome Measure (Version 2)  Help from another person eating meals? 4  Help from another person taking care of personal grooming? 3  Help from another person toileting, which includes using toliet, bedpan, or urinal? 3  Help from another person bathing  (including washing, rinsing, drying)? 3  Help from another person to put on and taking off regular upper body clothing? 3  Help from another person  to put on and taking off regular lower body clothing? 2  6 Click Score 18  Progressive Mobility  What is the highest level of mobility based on the mobility assessment? Level 4 (Ambulates with assistance) - Balance while stepping forward/back - Complete  Mobility Referral Yes  Activity Moved bed into chair position  OT Goal Progression  Progress towards OT goals Progressing toward goals  Acute Rehab OT Goals  Patient Stated Goal use L hand  OT Goal Formulation With patient  Time For Goal Achievement 11/18/24  Potential to Achieve Goals Good  ADL Goals  Pt Will Perform Upper Body Dressing with supervision;sitting  Pt Will Perform Lower Body Dressing with min assist;sitting/lateral leans;sit to/from stand  Pt Will Transfer to Toilet with min assist;stand pivot transfer;bedside commode  Pt/caregiver will Perform Home Exercise Program Increased ROM;Increased strength;Both right and left upper extremity;With minimal assist;With written HEP provided  Additional ADL Goal #1 Pt will perform bed mobility min A in prep for seated ADLs  OT Time Calculation  OT Start Time (ACUTE ONLY) 1548 (1548)  OT Stop Time (ACUTE ONLY) 1609  OT Time Calculation (min) 21 min  OT General Charges  $OT Visit 1 Visit  OT Treatments  $Therapeutic Exercise 8-22 mins  Kreg Sink, OT/L   Acute OT Clinical Specialist Acute Rehabilitation Services Pager [phone] Office [phone]

## 2024-11-16 NOTE — Progress Notes (Signed)
 Patient on room air and oxygen  saturation at 99%.

## 2024-11-16 NOTE — Progress Notes (Signed)
 Per MD order to wean patient for sat greater than 92%. This nurse reduced oxygen  from 3L/min to 2L/min. Patient saturation currently at 99% Will continue to wean patient and monitor saturations.

## 2024-11-16 NOTE — Progress Notes (Signed)
 Wound care complete

## 2024-11-16 NOTE — Plan of Care (Signed)
  Problem: Activity: Goal: Risk for activity intolerance will decrease Outcome: Progressing   Problem: Nutrition: Goal: Adequate nutrition will be maintained Outcome: Progressing   Problem: Elimination: Goal: Will not experience complications related to bowel motility Outcome: Progressing Goal: Will not experience complications related to urinary retention Outcome: Progressing   Problem: Pain Managment: Goal: General experience of comfort will improve and/or be controlled Outcome: Progressing   Problem: Safety: Goal: Ability to remain free from injury will improve Outcome: Progressing

## 2024-11-16 NOTE — Progress Notes (Signed)
 Mobility Specialist Progress Note:    11/16/24 1243  Mobility  Activity Ambulated with assistance (In hallway)  Level of Assistance Contact guard assist, steadying assist (+ Chair follow)  Assistive Device Front wheel walker  Distance Ambulated (ft) 40 ft (x3)  Activity Response Tolerated well  Mobility Referral Yes  Mobility visit 1 Mobility  Mobility Specialist Start Time (ACUTE ONLY) 1224  Mobility Specialist Stop Time (ACUTE ONLY) 1243  Mobility Specialist Time Calculation (min) (ACUTE ONLY) 19 min   Received pt in bed and agreeable to mobility. Found pt on 2 L/min O2. Pt required MinG and chair follow for safety. VSS throughout ambulation. Pt required x3 rest breaks d/t fatigue. Pt returned to room via chair d/t dizziness (see BP below). Pt requested to use the [facility]. Left pt on BSC with call light within reach. NT aware.  After ambulation BP: 140/85 (101)  Deniz Eskridge Mobility Specialist  Please contact via [employer] or  Rehab Office [phone]

## 2024-11-16 NOTE — Plan of Care (Signed)
 TF administered by gravity, pt tolerated it well.  Dressing change to abdomen performed. 1 PRN oxycodone  needed for pain management. PureWick changed early this morning. Pt was educated about appropriate meal selection for her current status.  Pt stated that the meatloaf that she had for dinner last night was hurting her stomach.  Problem: Clinical Measurements: Goal: Ability to maintain clinical measurements within normal limits will improve Outcome: Progressing   Problem: Pain Managment: Goal: General experience of comfort will improve and/or be controlled Outcome: Progressing   Problem: Safety: Goal: Ability to remain free from injury will improve Outcome: Progressing   Problem: Skin Integrity: Goal: Risk for impaired skin integrity will decrease Outcome: Progressing   Problem: Respiratory: Goal: Ability to maintain a clear airway and adequate ventilation will improve Outcome: Progressing

## 2024-11-16 NOTE — Progress Notes (Signed)
 Progress Note    Morgan [name]   FMW:[id number]  DOB: 1966/06/19  DOA: 10/29/2024     18 PCP: [name], DO  Initial CC: abdominal pain  Hospital Course: Ms. Bracken is a 58 year old female with history of Roux-en-Y gastric bypass who presented to the emergency department with abdominal pain, lactic acidosis and lipase greater than 2800.  She had CCS evaluation and went for exploratory laparotomy.  Found to have dense adhesive band that was obstructing her proximal small bowel as well as biliary leak without evidence of perforation.  She had a gastrostomy tube placed with a wound VAC.  Initially on Levophed , vasopressin , intubated on bicarb drip postoperatively. Patient's septic shock continued to improve, off pressors, NG tube remains in place, postoperative care per general surgery including diet advancement.  Patient completed antibiotics on 11/27, completed antifungals 11/25. Left thumb ischemia status post thrombectomy appears to be stable, vascular surgery following. Patient's chronic comorbid conditions otherwise appear to be improving.  Interval History:  No events overnight. Resting comfortably and worked with PT walking some yesterday for the first time she says.  Tolerating soft diet and still getting TF via PEG. Having BM and voiding well she says also.   Assessment and Plan:  Septic shock due to perforated SBO with peritonitis - resolved  E Coli bacteremia - resolved SBO due to adhesions - Status post ex lap with lysis of adhesions 11/18; washouts on 11/20 and abdominal closure on 11/22  - zosyn  completed for ecoli bactermia; micafungin  completed 11/25 for peritonitis  - G-tube in gastric remnant  - TPN off as of 11/27, adjusting tube feeds to promote daytime intake; now bolus feeds - also on soft diet  - wound VAC discontinued as of 12/4    Acute HFrEF- concerning given CAD hx vs septic cardiomyopathy, improving - Off dobutamine  - GDMT on hold due to  hypotension per heart failure team -continue furosemide  - EF recovered; rEF considered in setting of stress induced CM from sepsis; no further need for cath per cardiology - continue lasix , toprol , eliquis , losartan , zetia  (intolerant to statins)   Critical left hand ischemia due to thrombosed radial artery, s/p thrombectomy - Left radial arterial line discontinued 11/20, status post radial artery embolectomy with vascular surgery  - Allow this to declare then plan for amputation (suspected to take 3-4 weeks) - 12/2 reevaluated by vascular surgery, tip appears to be nonsalvageable but thumb improving - Transitioned to p.o. DOAC - Staple removal planned 12/10 -notify vascular surgery if patient discharges before this date   Urinary retention, improving Acute kidney injury, resolved - Foley previously removed  -currently with external urinary cath while in bed   Thrombocytopenia due to sepsis - resolved    Normocytic anemia - Presumed blood loss vs worsening chronic anemia of chronic disease/aplastic anemia - Hemoglobin improved to 8.6 after transfusion, 1 unit 11/25 - Hb stable in the low 8 range    Acute hypoxic respiratory failure postoperatively RLL PNA - suspect aspiration from SBO - extubated successfully 11/24 - wean O2 for sat >92%  - IS and mobilize as able - remains on 3L    Hypoglycemia  H/o DM2  - A1c 5.3 and not on meds PTA-- resolved after Roux-en-Y? -monitor; not needing insulin  and discontinued   Hypothyroidism - continue synthroid    Elevated transaminases, hyperbilirubinemia - bilirubin back to normal    Depression/Anxiety  - Increase sertraline  back to home dose - resumed ziprasidone     Pressure injury noted,  POA Wound 10/31/24 1716 Pressure Injury Coccyx Medial Deep Tissue Pressure Injury - Purple or maroon localized area of discolored intact skin or blood-filled blister due to damage of underlying soft tissue from pressure and/or shear. (Active)     DVT prophylaxis:  SCD's Start: 10/29/24 2346 apixaban  (ELIQUIS ) tablet 5 mg   Code Status:   Code Status: Full Code  Mobility Assessment (Last 72 Hours)     Mobility Assessment     Row Name 11/15/24 2116 11/15/24 1554 11/15/24 1300 11/15/24 1106 11/14/24 2055   Does the patient have exclusion criteria? No- Perform mobility assessment -- -- No- Perform mobility assessment Yes- Bedfast (Level 1) - Select exclusion criteria in next row   Mobility Assessment Exclusion Criteria No exclusion criteria present, perform mobility assessment -- -- No exclusion criteria present, perform mobility assessment Continuous sedation that cannot be weaned   What is the highest level of mobility based on the mobility assessment? Level 4 (Ambulates with assistance) - Balance while stepping forward/back - Complete Level 4 (Ambulates with assistance) - Balance while stepping forward/back - Complete Level 4 (Ambulates with assistance) - Balance while stepping forward/back - Complete Level 4 (Ambulates with assistance) - Balance while stepping forward/back - Complete Level 3 (Stands with assistance) - Balance while standing  and cannot march in place   Is the above level different from baseline mobility prior to current illness? Yes - Recommend PT order -- -- Yes - Recommend PT order Yes - Recommend PT order    Row Name 11/14/24 2043 11/14/24 0830 11/14/24 0000 11/13/24 1500 11/13/24 1333   Does the patient have exclusion criteria? Yes- Bedfast (Level 1) - Select exclusion criteria in next row No- Perform mobility assessment No- Perform mobility assessment -- --   Mobility Assessment Exclusion Criteria Continuous sedation that cannot be weaned No exclusion criteria present, perform mobility assessment -- -- --   What is the highest level of mobility based on the mobility assessment? -- Level 3 (Stands with assistance) - Balance while standing  and cannot march in place Level 3 (Stands with assistance) - Balance while  standing  and cannot march in place Level 3 (Stands with assistance) - Balance while standing  and cannot march in place Level 3 (Stands with assistance) - Balance while standing  and cannot march in place   Is the above level different from baseline mobility prior to current illness? -- No - Consider discontinuing PT/OT Yes - Recommend PT order -- --    Row Name 11/13/24 1300 11/13/24 1100         Does the patient have exclusion criteria? -- No- Perform mobility assessment      Mobility Assessment Exclusion Criteria -- No exclusion criteria present, perform mobility assessment      What is the highest level of mobility based on the mobility assessment? Level 3 (Stands with assistance) - Balance while standing  and cannot march in place Level 3 (Stands with assistance) - Balance while standing  and cannot march in place      Is the above level different from baseline mobility prior to current illness? -- Yes - Recommend PT order         Diet: Diet Orders (From admission, onward)     Start     Ordered   11/13/24 1359  DIET SOFT Room service appropriate? Yes with Assist; Fluid consistency: Thin  Diet effective now       Question Answer Comment  Room service appropriate? Yes with Assist  Fluid consistency: Thin      11/13/24 1358            Barriers to discharge: none Disposition Plan:  SNF HH orders placed: n/a Status is: Inpt  Objective: Blood pressure (!) 161/48, pulse 72, temperature 98 F (36.7 C), temperature source Oral, resp. rate 18, height 5' 4 (1.626 m), weight 67.5 kg, last menstrual period 10/17/2017, SpO2 100%.  Examination:  Physical Exam Constitutional:      Appearance: Normal appearance.  HENT:     Head: Normocephalic and atraumatic.     Mouth/Throat:     Mouth: Mucous membranes are moist.  Eyes:     Extraocular Movements: Extraocular movements intact.  Cardiovascular:     Rate and Rhythm: Normal rate and regular rhythm.  Pulmonary:     Effort:  Pulmonary effort is normal. No respiratory distress.     Breath sounds: Normal breath sounds. No wheezing.  Abdominal:     General: Bowel sounds are normal. There is no distension.     Palpations: Abdomen is soft.     Tenderness: There is no abdominal tenderness.     Comments: Midline vertical incision noted covered with gauze; G-tube noted in place  Musculoskeletal:     Cervical back: Normal range of motion and neck supple.     Comments: Distal tip left thumb noted with hard gangrenous tip of ~1/2 distal phalanx  Skin:    General: Skin is warm and dry.  Neurological:     General: No focal deficit present.     Mental Status: She is alert.  Psychiatric:        Mood and Affect: Mood normal.      Consultants:  Cardiology Vascular surgery General surgery    Data Reviewed: Results for orders placed or performed during the hospital encounter of 10/29/24 (from the past 24 hours)  Basic metabolic panel with GFR     Status: Abnormal   Collection Time: 11/16/24  2:08 AM  Result Value Ref Range   Sodium 137 135 - 145 mmol/L   Potassium 4.1 3.5 - 5.1 mmol/L   Chloride 99 98 - 111 mmol/L   CO2 32 22 - 32 mmol/L   Glucose, Bld 124 (H) 70 - 99 mg/dL   BUN 27 (H) 6 - 20 mg/dL   Creatinine, Ser 9.58 (L) 0.44 - 1.00 mg/dL   Calcium  8.2 (L) 8.9 - 10.3 mg/dL   GFR, Estimated >39 >39 mL/min   Anion gap 6 5 - 15    I have reviewed pertinent nursing notes, vitals, labs, and images as necessary. I have ordered labwork to follow up on as indicated.  I have reviewed the last notes from staff over past 24 hours. I have discussed patient's care plan and test results with nursing staff, CM/SW, and other staff as appropriate.  Old records reviewed in assessment of this patient  Time spent: Greater than 50% of the 55 minute visit was spent in counseling/coordination of care for the patient as laid out in the A&P.   LOS: 18 days   Alm Apo, MD [facility] 11/16/2024, 8:47 AM

## 2024-11-17 DIAGNOSIS — K56609 Unspecified intestinal obstruction, unspecified as to partial versus complete obstruction: Secondary | ICD-10-CM | POA: Diagnosis not present

## 2024-11-17 NOTE — Progress Notes (Signed)
 Occupational Therapy Treatment Patient Details Name: Morgan [name] MRN: [id number] DOB: Jan 20, 1966 Today's Date: 11/17/2024   History of present illness 58 yo F adm 10/29/24 with SBO s/p ex lap with g-tube placement same date. 11/20 and 11/22 repeat ex lap with VAC. VDRF 11/18-11/24. Pressor support with thrombosed radial artery s/p Lt radial artery embolectomy11/22. PMH: SBO, gastric bypass, DM, OSA, HLD, hypotension, bradycardia, Lt TSA   OT comments  Pt. Seen for skilled OT treatment session.  Tx. Session focused on continuation of previously introduced ROM and exercises for L digits/hand.  Pt. With good return demo from previous sessions.  Required intermittent cues and instruction to slow pace and complete full desired motion.  Addition of pinch and intrinsic strengthening today with use of foam pieces and rubber band.  Pt. Tolerated well.  Pt. reports completion of all exercises throughout the day.  Reviewed importance of use of both hands during ADLs ie: opening and management of self care items and condiment packages.  Pt. Required encouragement and modification instructions but was able to manage several portions of her lunch preparation today.        If plan is discharge home, recommend the following:  Assistance with cooking/housework;Direct supervision/assist for medications management;Direct supervision/assist for financial management;Assist for transportation;Help with stairs or ramp for entrance;A little help with walking and/or transfers;A little help with bathing/dressing/bathroom   Equipment Recommendations       Recommendations for Other Services      Precautions / Restrictions Precautions Precautions: Fall;Other (comment) Recall of Precautions/Restrictions: Intact Precaution/Restrictions Comments: Abd VAC, PICC Other Brace: L intrinsic plus resting hand splint - night wear only       Mobility Bed Mobility               General bed mobility comments: pt.  declined oob/eob today    Transfers                         Balance                                           ADL either performed or assessed with clinical judgement   ADL Overall ADL's : Needs assistance/impaired Eating/Feeding: Minimal assistance;Bed level;Sitting Eating/Feeding Details (indicate cue type and reason): encouragement for B hand use for functional tasks ie: opening packages for her lunch prep. pt. hesitant asking me to open all packages but with instruction and encouragement was able to open pudding lid with L hand holding the pudding and R hand pulling the lid. assistance for opening mayonase but had pt. squeeze packet onto her sandwich and spread it. assistance also for opening chip bag. pt. declined sitting in recliner to eat and also hob elevated to more of a seated position due to abdomen pain   Grooming Details (indicate cue type and reason): reviewed same tech. listed above for squeezing t.paste                   Toilet Transfer Details (indicate cue type and reason): rn present, pt. asking her to check pure wik suction.  reviewed with pt. benefit of not being on purewik and utilizing b.room and or/bsc pt. stating im just not as steady on my feet reviewed staff would be with her and the benefits of oob and toileting.  pt. deferred this option today  Extremity/Trunk Assessment Upper Extremity Assessment LUE Deficits / Details: pt. able to complete ROM/exercises previously introduced with good recall.  light intervention and cueing to slow pace and complete full ROM during grasp/release.  reports difficulty achieving some of the tendon gliding exercises but encouraged to cont. efforts. (Handout provided with pics)  utilized yellow foam (pink not available) and cut into cubes for focus on pinch.  pt. able to complete multiple times with no dropping of pieces.  introduced rubberband for focus on intrinsic strengthening  with open/closing with spread of fingers.  encouragement for B hand use for functional tasks ie: opening packages for her lunch prep. pt. hesitant asking me to open all packages but with instruction and encouragement was able to open pudding lid with L hand holding the pudding and R hand pulling the lid.  assistance for opening mayonase but had pt. squeeze packet onto her sandwich and spread it. assistance also for opening chip bag.            Vision       Perception     Praxis     Communication Communication Communication: No apparent difficulties   Cognition Arousal: Alert Behavior During Therapy: WFL for tasks assessed/performed             Executive functioning impairment (select all impairments): Sequencing, Reasoning OT - Cognition Comments: slow processing; difficulty following through with ROM for hand at times                 Following commands: Intact Following commands impaired: Follows one step commands with increased time      Cueing   Cueing Techniques: Verbal cues, Tactile cues  Exercises Hand Exercises Forearm Supination: AROM, Left, 15 reps Forearm Pronation: AROM, Left, 15 reps Wrist Flexion: AROM, Left, 10 reps Wrist Extension: AROM, Left, AAROM, 10 reps Wrist Ulnar Deviation: AROM, AAROM, Left, 10 reps Wrist Radial Deviation: AROM, AAROM, Left, 10 reps Digit Composite Flexion: AROM, Left, 10 reps, Seated Composite Extension: AROM, Left, 10 reps, Seated Digit Composite Abduction: Left, AROM, 10 reps, Seated Digit Composite Adduction: Left, AAROM, 10 reps, Seated Digit Lifts: Left, AROM, 10 reps, Seated Thumb Abduction: AROM, Left, 10 reps Thumb Adduction: PROM, AAROM, Left, 15 reps Opposition: AAROM, PROM, Left, 5 reps, Seated Other Exercises Other Exercises: prayer stretch x 5, hold x 10 seconds Other Exercises: IPblocking exercises for DIP and PIP ROM - all digits Other Exercises: nerve glides L hand L hand/digit pinch of block pieces  and lift and carry, drop off across the tray table x 2 in both directions without any dropping Rubberband on left digits for intrinsic strength with open/close    Shoulder Instructions       General Comments      Pertinent Vitals/ Pain       Pain Assessment Pain Assessment: 0-10 Pain Score: 5  Pain Location: abdomen; L digit ROM Pain Descriptors / Indicators: Discomfort Pain Intervention(s): Limited activity within patient's tolerance, Repositioned, RN gave pain meds during session  Home Living                                          Prior Functioning/Environment              Frequency  Min 3X/week        Progress Toward Goals  OT Goals(current goals can now be found in the care plan  section)  Progress towards OT goals: Progressing toward goals     Plan      Co-evaluation                 AM-PAC OT 6 Clicks Daily Activity     Outcome Measure   Help from another person eating meals?: None Help from another person taking care of personal grooming?: A Little Help from another person toileting, which includes using toliet, bedpan, or urinal?: A Little Help from another person bathing (including washing, rinsing, drying)?: A Little Help from another person to put on and taking off regular upper body clothing?: A Little Help from another person to put on and taking off regular lower body clothing?: A Lot 6 Click Score: 18    End of Session    OT Visit Diagnosis: Unsteadiness on feet (R26.81);Other abnormalities of gait and mobility (R26.89);Muscle weakness (generalized) (M62.81)   Activity Tolerance Patient tolerated treatment well   Patient Left in bed;with call bell/phone within reach;with bed alarm set   Nurse Communication Other (comment) (rn present providing pain meds, also suggests pt. use bsc or b.room. rn notified at end of session pt. wanting thumb bandage re applied)        Time: 8866-8848 OT Time Calculation  (min): 18 min  Charges: OT General Charges $OT Visit: 1 Visit OT Treatments $Therapeutic Exercise: 8-22 mins  Randall, COTA/L Acute Rehabilitation [phone]   Morgan [name] Nest [place]-COTA/L  11/17/2024, 12:12 PM

## 2024-11-17 NOTE — Progress Notes (Signed)
 Progress Note    Morgan [name]   FMW:[id number]  DOB: 03-10-1966  DOA: 10/29/2024     19 PCP: Morgan Norene HERO, DO  Initial CC: abdominal pain  Hospital Course: Morgan [name] is a 58 year old female with history of Roux-en-Y gastric bypass who presented to the emergency department with abdominal pain, lactic acidosis and lipase greater than 2800.  She had CCS evaluation and went for exploratory laparotomy.  Found to have dense adhesive band that was obstructing her proximal small bowel as well as biliary leak without evidence of perforation.  She had a gastrostomy tube placed with a wound VAC.  Initially on Levophed , vasopressin , intubated on bicarb drip postoperatively. Patient's septic shock continued to improve, off pressors, NG tube remains in place, postoperative care per general surgery including diet advancement.  Patient completed antibiotics on 11/27, completed antifungals 11/25. Left thumb ischemia status post thrombectomy appears to be stable, vascular surgery following. Patient's chronic comorbid conditions otherwise appear to be improving.  Interval History:  No events overnight. Abdomen a little sore this morning but otherwise doing okay.   Assessment and Plan:  Septic shock due to perforated SBO with peritonitis - resolved  E Coli bacteremia - resolved SBO due to adhesions - Status post ex lap with lysis of adhesions 11/18; washouts on 11/20 and abdominal closure on 11/22  - zosyn  completed for ecoli bactermia; micafungin  completed 11/25 for peritonitis  - G-tube in gastric remnant  - TPN off as of 11/27, adjusting tube feeds to promote daytime intake; now bolus feeds - also on soft diet  - wound VAC discontinued as of 12/4    Acute HFrEF- concerning given CAD hx vs septic cardiomyopathy, improving - Off dobutamine  - GDMT on hold due to hypotension per heart failure team -continue furosemide  - EF recovered; rEF considered in setting of stress induced CM from  sepsis; no further need for cath per cardiology - continue lasix , toprol , eliquis , losartan , zetia  (intolerant to statins)   Critical left hand ischemia due to thrombosed radial artery, s/p thrombectomy - Left radial arterial line discontinued 11/20, status post radial artery embolectomy with vascular surgery  - Allow this to declare then plan for amputation (suspected to take 3-4 weeks) - 12/2 reevaluated by vascular surgery, tip appears to be nonsalvageable but thumb improving - Transitioned to p.o. DOAC - Staple removal planned 12/10 -notify vascular surgery if patient discharges before this date   Urinary retention, improving Acute kidney injury, resolved - Foley previously removed  -currently with external urinary cath while in bed   Thrombocytopenia due to sepsis - resolved    Normocytic anemia - Presumed blood loss vs worsening chronic anemia of chronic disease/aplastic anemia - Hemoglobin improved to 8.6 after transfusion, 1 unit 11/25 - Hb stable in the low 8 range    Acute hypoxic respiratory failure postoperatively RLL PNA - suspect aspiration from SBO - extubated successfully 11/24 - wean O2 for sat >92%  - IS and mobilize as able - remains on 3L    Hypoglycemia  H/o DM2  - A1c 5.3 and not on meds PTA-- resolved after Roux-en-Y? -monitor; not needing insulin  and discontinued   Hypothyroidism - continue synthroid    Elevated transaminases, hyperbilirubinemia - bilirubin back to normal    Depression/Anxiety  - Increase sertraline  back to home dose - resumed ziprasidone     Pressure injury noted, POA Wound 10/31/24 1716 Pressure Injury Coccyx Medial Deep Tissue Pressure Injury - Purple or maroon localized area of discolored intact skin or  blood-filled blister due to damage of underlying soft tissue from pressure and/or shear. (Active)    DVT prophylaxis:  SCD's Start: 10/29/24 2346 apixaban  (ELIQUIS ) tablet 5 mg   Code Status:   Code Status: Full  Code  Mobility Assessment (Last 72 Hours)     Mobility Assessment     Row Name 11/17/24 0934 11/16/24 2042 11/16/24 1800 11/16/24 1106 11/15/24 2116   Does the patient have exclusion criteria? No- Perform mobility assessment No- Perform mobility assessment -- No- Perform mobility assessment No- Perform mobility assessment   Mobility Assessment Exclusion Criteria -- -- -- -- No exclusion criteria present, perform mobility assessment   What is the highest level of mobility based on the mobility assessment? Level 4 (Ambulates with assistance) - Balance while stepping forward/back - Complete Level 4 (Ambulates with assistance) - Balance while stepping forward/back - Complete Level 4 (Ambulates with assistance) - Balance while stepping forward/back - Complete Level 4 (Ambulates with assistance) - Balance while stepping forward/back - Complete Level 4 (Ambulates with assistance) - Balance while stepping forward/back - Complete   Is the above level different from baseline mobility prior to current illness? Yes - Recommend PT order Yes - Recommend PT order -- Yes - Recommend PT order Yes - Recommend PT order    Row Name 11/15/24 1554 11/15/24 1300 11/15/24 1106 11/14/24 2055 11/14/24 2043   Does the patient have exclusion criteria? -- -- No- Perform mobility assessment Yes- Bedfast (Level 1) - Select exclusion criteria in next row Yes- Bedfast (Level 1) - Select exclusion criteria in next row   Mobility Assessment Exclusion Criteria -- -- No exclusion criteria present, perform mobility assessment Continuous sedation that cannot be weaned Continuous sedation that cannot be weaned   What is the highest level of mobility based on the mobility assessment? Level 4 (Ambulates with assistance) - Balance while stepping forward/back - Complete Level 4 (Ambulates with assistance) - Balance while stepping forward/back - Complete Level 4 (Ambulates with assistance) - Balance while stepping forward/back - Complete Level 3  (Stands with assistance) - Balance while standing  and cannot march in place --   Is the above level different from baseline mobility prior to current illness? -- -- Yes - Recommend PT order Yes - Recommend PT order --      Diet: Diet Orders (From admission, onward)     Start     Ordered   11/13/24 1359  DIET SOFT Room service appropriate? Yes with Assist; Fluid consistency: Thin  Diet effective now       Question Answer Comment  Room service appropriate? Yes with Assist   Fluid consistency: Thin      11/13/24 1358            Barriers to discharge: none Disposition Plan:  SNF HH orders placed: n/a Status is: Inpt  Objective: Blood pressure 105/64, pulse 81, temperature 97.9 F (36.6 C), temperature source Oral, resp. rate 16, height 5' 4 (1.626 m), weight 67.5 kg, last menstrual period 10/17/2017, SpO2 96%.  Examination:  Physical Exam Constitutional:      Appearance: Normal appearance.  HENT:     Head: Normocephalic and atraumatic.     Mouth/Throat:     Mouth: Mucous membranes are moist.  Eyes:     Extraocular Movements: Extraocular movements intact.  Cardiovascular:     Rate and Rhythm: Normal rate and regular rhythm.  Pulmonary:     Effort: Pulmonary effort is normal. No respiratory distress.     Breath  sounds: Normal breath sounds. No wheezing.  Abdominal:     General: Bowel sounds are normal. There is no distension.     Palpations: Abdomen is soft.     Tenderness: There is no abdominal tenderness.     Comments: Midline vertical incision noted covered with gauze; G-tube noted in place  Musculoskeletal:     Cervical back: Normal range of motion and neck supple.     Comments: Distal tip left thumb noted with hard gangrenous tip of ~1/2 distal phalanx  Skin:    General: Skin is warm and dry.  Neurological:     General: No focal deficit present.     Mental Status: She is alert.  Psychiatric:        Mood and Affect: Mood normal.      Consultants:   Cardiology Vascular surgery General surgery    Data Reviewed: No results found for this or any previous visit (from the past 24 hours).   I have reviewed pertinent nursing notes, vitals, labs, and images as necessary. I have ordered labwork to follow up on as indicated.  I have reviewed the last notes from staff over past 24 hours. I have discussed patient's care plan and test results with nursing staff, CM/SW, and other staff as appropriate.  Old records reviewed in assessment of this patient  Time spent: Greater than 50% of the 55 minute visit was spent in counseling/coordination of care for the patient as laid out in the A&P.   LOS: 19 days   Alm Apo, MD [facility] 11/17/2024, 12:30 PM

## 2024-11-17 NOTE — Plan of Care (Signed)
  Problem: Coping: Goal: Level of anxiety will decrease Outcome: Progressing   Problem: Elimination: Goal: Will not experience complications related to bowel motility Outcome: Progressing Goal: Will not experience complications related to urinary retention Outcome: Progressing   Problem: Pain Managment: Goal: General experience of comfort will improve and/or be controlled Outcome: Progressing   Problem: Safety: Goal: Ability to remain free from injury will improve Outcome: Progressing   Problem: Respiratory: Goal: Ability to maintain a clear airway and adequate ventilation will improve Outcome: Progressing   Problem: Skin Integrity: Goal: Risk for impaired skin integrity will decrease Outcome: Progressing   Problem: Tissue Perfusion: Goal: Adequacy of tissue perfusion will improve Outcome: Progressing

## 2024-11-17 NOTE — Progress Notes (Signed)
 Mobility Specialist Progress Note:    11/17/24 1242  Mobility  Activity Ambulated with assistance (In hallway)  Level of Assistance Contact guard assist, steadying assist (+ chair follow)  Assistive Device Front wheel walker  Distance Ambulated (ft) 45 ft (x3)  Activity Response Tolerated well  Mobility Referral Yes  Mobility visit 1 Mobility  Mobility Specialist Start Time (ACUTE ONLY) 1228  Mobility Specialist Stop Time (ACUTE ONLY) 1242  Mobility Specialist Time Calculation (min) (ACUTE ONLY) 14 min   Received pt in bed and eager for mobility. Found pt on RA. Pt required MinG and chair follow for safety. VSS throughout ambulation. Pt required x3 rest breaks d/t fatigue. No c/o. Returned to room without fault. Left pt in chair. Personal belongings and call light within reach. All needs met.  Lavanda Pollack Mobility Specialist  Please contact via [employer] or  Rehab Office [phone]

## 2024-11-17 NOTE — Plan of Care (Signed)
  Problem: Clinical Measurements: Goal: Ability to maintain clinical measurements within normal limits will improve Outcome: Progressing   Problem: Coping: Goal: Level of anxiety will decrease Outcome: Progressing   Problem: Elimination: Goal: Will not experience complications related to bowel motility Outcome: Progressing   Problem: Pain Managment: Goal: General experience of comfort will improve and/or be controlled Outcome: Progressing   Problem: Activity: Goal: Ability to tolerate increased activity will improve Outcome: Progressing

## 2024-11-17 NOTE — Progress Notes (Signed)
 Patient refuse additional Mallie [name] supplements and states, I can't drink them because it hurts my stomach. There were two unopened at the bedside. This nurse will communicate with oncoming nurse to communicate with RD in the morning.

## 2024-11-17 NOTE — Progress Notes (Signed)
 Wound care complete

## 2024-11-18 LAB — CBC
HCT: 25.7 % — ABNORMAL LOW (ref 36.0–46.0)
Hemoglobin: 7.9 g/dL — ABNORMAL LOW (ref 12.0–15.0)
MCH: 27.1 pg (ref 26.0–34.0)
MCHC: 30.7 g/dL (ref 30.0–36.0)
MCV: 88.3 fL (ref 80.0–100.0)
Platelets: 677 K/uL — ABNORMAL HIGH (ref 150–400)
RBC: 2.91 MIL/uL — ABNORMAL LOW (ref 3.87–5.11)
RDW: 14.9 % (ref 11.5–15.5)
WBC: 5.5 K/uL (ref 4.0–10.5)
nRBC: 0 % (ref 0.0–0.2)

## 2024-11-18 LAB — GLUCOSE, CAPILLARY: Glucose-Capillary: 103 mg/dL — ABNORMAL HIGH (ref 70–99)

## 2024-11-18 MED ORDER — OXYCODONE HCL 5 MG PO TABS: 5.0000 mg | ORAL_TABLET | ORAL | 0 refills | Status: DC | PRN

## 2024-11-18 MED ORDER — SIMETHICONE 125 MG PO CAPS: 125.0000 mg | ORAL_CAPSULE | Freq: Three times a day (TID) | ORAL | Status: AC | PRN

## 2024-11-18 MED ORDER — APIXABAN 5 MG PO TABS: 5.0000 mg | ORAL_TABLET | Freq: Two times a day (BID) | ORAL | Status: DC

## 2024-11-18 MED ORDER — METOPROLOL SUCCINATE ER 25 MG PO TB24: 12.5000 mg | ORAL_TABLET | Freq: Every day | ORAL | Status: DC

## 2024-11-18 MED ORDER — ACETAMINOPHEN 325 MG PO TABS: 650.0000 mg | ORAL_TABLET | ORAL | Status: AC | PRN

## 2024-11-18 MED ORDER — KATE FARMS STANDARD 1.4 EN LIQD: 325.0000 mL | Freq: Two times a day (BID) | ENTERAL | Status: DC

## 2024-11-18 MED ORDER — FUROSEMIDE 40 MG PO TABS: 40.0000 mg | ORAL_TABLET | Freq: Every day | ORAL | Status: DC

## 2024-11-18 MED ORDER — FREE WATER: 30.0000 mL | Status: DC

## 2024-11-18 MED ORDER — ASCORBIC ACID 500 MG PO TABS: 500.0000 mg | ORAL_TABLET | Freq: Two times a day (BID) | ORAL | Status: DC

## 2024-11-18 MED ORDER — LOSARTAN POTASSIUM 25 MG PO TABS: 25.0000 mg | ORAL_TABLET | Freq: Every day | ORAL | Status: DC

## 2024-11-18 MED ORDER — ZINC SULFATE 220 (50 ZN) MG PO CAPS: 220.0000 mg | ORAL_CAPSULE | Freq: Every day | ORAL | Status: AC

## 2024-11-18 MED ORDER — OSMOLITE 1.5 CAL PO LIQD: 237.0000 mL | Freq: Two times a day (BID) | ORAL | Status: DC

## 2024-11-18 MED ORDER — CARMEX CLASSIC LIP BALM EX OINT: TOPICAL_OINTMENT | CUTANEOUS | Status: DC | PRN

## 2024-11-18 MED ORDER — PROSOURCE TF20 ENFIT COMPATIBL EN LIQD: 60.0000 mL | Freq: Two times a day (BID) | ENTERAL | Status: DC

## 2024-11-18 MED ORDER — VITAMIN A 3 MG (10000 UNIT) PO CAPS: 10000.0000 [IU] | ORAL_CAPSULE | Freq: Every day | ORAL | Status: AC

## 2024-11-18 MED ORDER — ONDANSETRON HCL 4 MG PO TABS: 4.0000 mg | ORAL_TABLET | Freq: Four times a day (QID) | ORAL | Status: DC | PRN

## 2024-11-18 MED ORDER — SENNOSIDES-DOCUSATE SODIUM 8.6-50 MG PO TABS: 1.0000 | ORAL_TABLET | Freq: Every day | ORAL | Status: DC

## 2024-11-18 NOTE — Consult Note (Signed)
 WOC Nurse wound follow up Wound type: Sacrum PI stage 3. Measurement: 2.5 cm x 2 cm x 0.1 cm. Wound bed: 100% yellow. Drainage (amount, consistency, odor) Scant amount, serous. Periwound: Intact. Dressing procedure/placement/frequency: Continue with Xeroform daily and top with silicone foam dressin. The foam can stay up to 3 days if not saturated or soiled. Ok to lift and reapply the Xeroform.   WOC team will follow weekly. Please reconsult if further assistance is needed. Thank-you,  Lela Holm MSN, RN, CWCN, CNS.  (Phone [phone])

## 2024-11-18 NOTE — Discharge Summary (Signed)
**Note Morgan [name]**  Physician Discharge Summary   [name]:[id number] DOB: [birth year] DOA: 10/29/2024  PCP: [name], DO  Admit date: 10/29/2024 Discharge date: 11/18/2024  Admitted From: Home Disposition:  [facility] Discharging physician: Alm Apo, MD Barriers to discharge: none  Recommendations at discharge: Follow up with general surgery Follow up with hand surgery - office should be calling for follow up   Discharge Condition: stable CODE STATUS: Full Diet recommendation:  Diet Orders (From admission, onward)     Start     Ordered   11/13/24 1359  DIET SOFT Room service appropriate? Yes with Assist; Fluid consistency: Thin  Diet effective now       Question Answer Comment  Room service appropriate? Yes with Assist   Fluid consistency: Thin      11/13/24 1358            Hospital Course: [name] is a 58 year old female with history of Roux-en-Y gastric bypass who presented to the emergency department with abdominal pain, lactic acidosis and lipase greater than 2800.  She had CCS evaluation and went for exploratory laparotomy.  Found to have dense adhesive band that was obstructing her proximal small bowel as well as biliary leak without evidence of perforation.  She had a gastrostomy tube placed with a wound VAC.  Initially on Levophed , vasopressin , intubated on bicarb drip postoperatively. Patient's septic shock continued to improve, off pressors, NG tube remains in place, postoperative care per general surgery including diet advancement.  Patient completed antibiotics on 11/27, completed antifungals 11/25. Left thumb ischemia status post thrombectomy appears to be stable, vascular surgery following. Patient's chronic comorbid conditions otherwise appear to be improving.   Assessment and Plan:   Septic shock due to perforated SBO with peritonitis - resolved  E Coli bacteremia - resolved SBO due to adhesions - Status post ex lap with lysis of  adhesions 11/18; washouts on 11/20 and abdominal closure on 11/22  - zosyn  completed for ecoli bactermia; micafungin  completed 11/25 for peritonitis  - G-tube in gastric remnant  - TPN off as of 11/27, adjusting tube feeds to promote daytime intake; now bolus feeds - also on soft diet  - wound VAC discontinued as of 12/4  - outpatient follow up with general surgery   Acute HFrEF- concerning given CAD hx vs septic cardiomyopathy, improving - Off dobutamine  - GDMT on hold due to hypotension per heart failure team -continue furosemide  - EF recovered; rEF considered in setting of stress induced CM from sepsis; no further need for cath per cardiology - continue lasix , toprol , eliquis , losartan , zetia  (intolerant to statins)   Critical left hand ischemia due to thrombosed radial artery, s/p thrombectomy - Left radial arterial line discontinued 11/20, status post radial artery embolectomy with vascular surgery  - Allow this to declare then plan for amputation (suspected to take 3-4 weeks) - 12/2 reevaluated by vascular surgery, tip appears to be nonsalvageable but thumb improving - Transitioned to p.o. DOAC - staples removed by vascular surgery on 12/8; outpatient follow up with hand surgery arranged for follow up of thumb for amputation discussions ([name])   Urinary retention - resolved  Acute kidney injury, resolved - Foley previously removed    Thrombocytopenia due to sepsis - resolved    Normocytic anemia - Presumed blood loss vs worsening chronic anemia of chronic disease/aplastic anemia - Hemoglobin improved to 8.6 after transfusion, 1 unit 11/25 - Hb stable in the low 8 range    Acute hypoxic respiratory failure  postoperatively - resolved  RLL PNA - suspect aspiration from SBO - extubated successfully 11/24 - wean O2 for sat >92%  - IS and mobilize as able - on RA   Hypoglycemia - resolved  H/o DM2  - A1c 5.3 and not on meds PTA-- resolved after Roux-en-Y? -monitor;  not needing insulin  and discontinued   Hypothyroidism - continue synthroid    Elevated transaminases, hyperbilirubinemia - bilirubin back to normal    Depression/Anxiety  - Increase sertraline  back to home dose - resumed ziprasidone     Pressure injury noted, POA Wound 10/31/24 1716 Pressure Injury Coccyx Medial Deep Tissue Pressure Injury - Purple or maroon localized area of discolored intact skin or blood-filled blister due to damage of underlying soft tissue from pressure and/or shear. (Active)     Principal Diagnosis: SBO (small bowel obstruction) (HCC)  Discharge Diagnoses: Active Hospital Problems   Diagnosis Date Noted   SBO (small bowel obstruction) s/p lap LOA 03/19/2019 03/19/2019    Priority: 1.   Protein-calorie malnutrition, severe 11/05/2024   Malnutrition of moderate degree 10/30/2024    Resolved Hospital Problems  No resolved problems to display.     Discharge Instructions     Discharge wound care:   Complete by: As directed    1) Wound care  Every shift: Comments: Apply moist kerlex to abd wound BID, then cover with ABD pad and tape 2) Wound care  Every other day: Comments: Cleanse sacral wound with saline, pat dry. Apply single layer of xeroform gauze to open wound, top with foam. Change every other day   Increase activity slowly   Complete by: As directed       Allergies as of 11/18/2024       Reactions   Ketoconazole Hives, Swelling, Other (See Comments)   SWELLING REACTION UNSPECIFIED    Pravachol  [pravastatin  Sodium] Shortness Of Breath, Swelling, Anaphylaxis   Throat swelling   Statins Other (See Comments)   Leg cramps   Tape Dermatitis, Other (See Comments)   Steri-Strips   Repatha  [evolocumab ] Other (See Comments)   Made the stomach hurt badly.   Codeine Other (See Comments), Hypertension   Increased B/P        Medication List     STOP taking these medications    nystatin  powder Commonly known as: MYCOSTATIN /NYSTOP         TAKE these medications    acetaminophen  325 MG tablet Commonly known as: TYLENOL  Take 2 tablets (650 mg total) by mouth every 4 (four) hours as needed for mild pain (pain score 1-3) or moderate pain (pain score 4-6).   apixaban  5 MG Tabs tablet Commonly known as: ELIQUIS  Take 1 tablet (5 mg total) by mouth 2 (two) times daily.   ascorbic acid  500 MG tablet Commonly known as: VITAMIN C  Take 1 tablet (500 mg total) by mouth 2 (two) times daily.   BARIATRIC MULTIVITAMINS/IRON  PO Take 1 tablet by mouth daily.   Calcium -Vitamin D -Vitamin K  650-12.5-40 MG-MCG-MCG Chew Chew 1 each by mouth 2 (two) times daily.   cyclobenzaprine  10 MG tablet Commonly known as: FLEXERIL  Take 1 tablet (10 mg total) by mouth 3 (three) times daily as needed for muscle spasms.   desloratadine  5 MG tablet Commonly known as: CLARINEX  TAKE ONE TABLET DAILY What changed:  when to take this additional instructions   Dexcom G7 Sensor Misc Inject 1 Device into the skin every 14 (fourteen) days.   dicyclomine  20 MG tablet Commonly known as: BENTYL  TAKE 1 TABLET EVERY 8 HOURS  AS NEEDED FOR ABDOMINAL CRAMPING What changed: See the new instructions.   Erenumab -aooe 140 MG/ML Soaj Inject 140 mg into the skin every 30 (thirty) days.   ezetimibe  10 MG tablet Commonly known as: ZETIA  TAKE ONE TABLET DAILY   feeding supplement (KATE FARMS STANDARD ENT 1.4) Liqd liquid Take 325 mLs by mouth 2 (two) times daily between meals.   feeding supplement (OSMOLITE 1.5 CAL) Liqd Place 237 mLs into feeding tube 2 (two) times daily.   feeding supplement (PROSource TF20) liquid Place 60 mLs into feeding tube 2 (two) times daily.   free water  Soln Place 30 mLs into feeding tube every 4 (four) hours.   furosemide  40 MG tablet Commonly known as: LASIX  Take 1 tablet (40 mg total) by mouth daily. Start taking on: November 19, 2024   gabapentin  300 MG capsule Commonly known as: NEURONTIN  Takes 2 capsules in  morning, one cap at lunch, 2 caps at bedtime What changed:  how much to take how to take this when to take this additional instructions   Glucagon  3 MG/DOSE Powd Place 3 mg into the nose once as needed (for a severe diabetic emergency).   GLUCAGON  EMERGENCY IJ Inject 1 Syringe as directed daily as needed (emergency low blood sugar).   levothyroxine  112 MCG tablet Commonly known as: SYNTHROID  TAKE ONE TABLET ONCE DAILY BEFORE BREAKFAST What changed: See the new instructions.   linaclotide  290 MCG Caps capsule Commonly known as: Linzess  Take 1 capsule (290 mcg total) by mouth daily before breakfast.   lip balm ointment Apply topically as needed.   losartan  25 MG tablet Commonly known as: COZAAR  Take 1 tablet (25 mg total) by mouth daily. Start taking on: November 19, 2024   metoprolol  succinate 25 MG 24 hr tablet Commonly known as: TOPROL -XL Take 0.5 tablets (12.5 mg total) by mouth at bedtime.   mometasone  50 MCG/ACT nasal spray Commonly known as: NASONEX  USE 2 SPRAYS IN EACH NOSTRIL ONCE DAILY (REPLACES FLONASE ) What changed: See the new instructions.   Myrbetriq  25 MG Tb24 tablet Generic drug: mirabegron  ER TAKE ONE TABLET DAILY   ondansetron  4 MG tablet Commonly known as: ZOFRAN  Take 1 tablet (4 mg total) by mouth every 6 (six) hours as needed for nausea or vomiting.   OneTouch Ultra test strip Generic drug: glucose blood Check BS four times a day Dx E11.9   oxyCODONE  5 MG immediate release tablet Commonly known as: Oxy IR/ROXICODONE  Take 1 tablet (5 mg total) by mouth every 4 (four) hours as needed for severe pain (pain score 7-10) or breakthrough pain.   pantoprazole  40 MG tablet Commonly known as: PROTONIX  TAKE ONE TABLET DAILY What changed: when to take this   polyethylene glycol 17 g packet Commonly known as: MIRALAX  / GLYCOLAX  Take 17 g by mouth at bedtime.   PROBIOTIC DAILY PO Take 1 capsule by mouth daily.   Repatha  SureClick 140 MG/ML  Soaj Generic drug: Evolocumab  Inject 140 mg into the skin every 14 (fourteen) days.   senna-docusate 8.6-50 MG tablet Commonly known as: Senokot-S Take 1 tablet by mouth at bedtime.   sertraline  100 MG tablet Commonly known as: ZOLOFT  TAKE TWO TABLETS DAILY AS DIRECTED What changed:  how much to take how to take this when to take this additional instructions   Simethicone  125 MG Caps Commonly known as: Gas-X Extra Strength Take 1 capsule (125 mg total) by mouth 3 (three) times daily as needed (gas). What changed:  when to take this reasons to  take this   SUMAtriptan  100 MG tablet Commonly known as: IMITREX  Take 1 tablet (100 mg total) by mouth once as needed for up to 1 dose for migraine. May repeat in 2 hours if headache persists or recurs.   vitamin A  3 MG (10000 UNITS) capsule Take 1 capsule (10,000 Units total) by mouth daily for 23 days. Start taking on: November 19, 2024   zinc  sulfate (50mg  elemental zinc ) 220 (50 Zn) MG capsule Take 1 capsule (220 mg total) by mouth daily for 27 days. Start taking on: November 19, 2024   ziprasidone  60 MG capsule Commonly known as: GEODON  TAKE ONE CAPSULE AT BEDTIME What changed:  when to take this additional instructions               Discharge Care Instructions  (From admission, onward)           Start     Ordered   11/18/24 0000  Discharge wound care:       Comments: 1) Wound care  Every shift: Comments: Apply moist kerlex to abd wound BID, then cover with ABD pad and tape 2) Wound care  Every other day: Comments: Cleanse sacral wound with saline, pat dry. Apply single layer of xeroform gauze to open wound, top with foam. Change every other day   11/18/24 1012            Contact information for follow-up providers     Tanda Locus, MD Follow up on 12/19/2024.   Specialty: General Surgery Why: 2:15pm, Arrive 30 minutes prior to your appointment time, Please bring your insurance card and photo ID Contact  information: [address] [place] [address] [phone]         Alyse Agent, MD. Schedule an appointment as soon as possible for a visit in 2 week(s).   Specialty: Orthopedic Surgery Contact information: [address] [place] [address] [phone]              Contact information for after-discharge care     Destination     [facility] .   Service: Skilled Nursing Contact information: [address] [place]  [address] [phone]                    Allergies  Allergen Reactions   Ketoconazole Hives, Swelling and Other (See Comments)    SWELLING REACTION UNSPECIFIED    Pravachol  [Pravastatin  Sodium] Shortness Of Breath, Swelling and Anaphylaxis    Throat swelling   Statins Other (See Comments)    Leg cramps   Tape Dermatitis and Other (See Comments)    Steri-Strips   Repatha  [Evolocumab ] Other (See Comments)    Made the stomach hurt badly.   Codeine Other (See Comments) and Hypertension    Increased B/P    Consultations: General surgery Vascular Surgery  Discharge Exam: BP 106/74 (BP Location: Left Arm)   Pulse 84   Temp 98.5 F (36.9 C) (Oral)   Resp 16   Ht 5' 4 (1.626 m)   Wt 67.5 kg   LMP 10/17/2017 (Approximate)   SpO2 100%   BMI 25.54 kg/m  Physical Exam Constitutional:      Appearance: Normal appearance.  HENT:     Head: Normocephalic and atraumatic.     Mouth/Throat:     Mouth: Mucous membranes are moist.  Eyes:     Extraocular Movements: Extraocular movements intact.  Cardiovascular:  Rate and Rhythm: Normal rate and regular rhythm.  Pulmonary:     Effort: Pulmonary effort is normal. No respiratory distress.     Breath sounds: Normal breath sounds. No wheezing.  Abdominal:     General: Bowel sounds are normal. There is no distension.     Palpations: Abdomen is soft.     Tenderness: There is no abdominal  tenderness.     Comments: Midline vertical incision noted covered with gauze; G-tube noted in place  Musculoskeletal:     Cervical back: Normal range of motion and neck supple.     Comments: Distal tip left thumb noted with hard gangrenous tip of ~1/2 distal phalanx  Skin:    General: Skin is warm and dry.  Neurological:     General: No focal deficit present.     Mental Status: She is alert.  Psychiatric:        Mood and Affect: Mood normal.      The results of significant diagnostics from this hospitalization (including imaging, microbiology, ancillary and laboratory) are listed below for reference.   Microbiology: No results found for this or any previous visit (from the past 240 hours).   Labs: BNP (last 3 results) No results for input(s): BNP in the last 8760 hours. Basic Metabolic Panel: Recent Labs  Lab 11/12/24 0405 11/14/24 0153 11/16/24 0208  NA 135 135 137  K 5.0 4.7 4.1  CL 93* 95* 99  CO2 36* 35* 32  GLUCOSE 203* 131* 124*  BUN 27* 20 27*  CREATININE 0.43* 0.44 0.41*  CALCIUM  8.1* 7.9* 8.2*   Liver Function Tests: No results for input(s): AST, ALT, ALKPHOS, BILITOT, PROT, ALBUMIN  in the last 168 hours. No results for input(s): LIPASE, AMYLASE in the last 168 hours. No results for input(s): AMMONIA in the last 168 hours. CBC: Recent Labs  Lab 11/12/24 0407 11/14/24 0153 11/18/24 0405  WBC 11.3* 8.4 5.5  HGB 8.1* 8.2* 7.9*  HCT 25.9* 26.8* 25.7*  MCV 89.3 89.6 88.3  PLT 601* 749* 677*   Cardiac Enzymes: No results for input(s): CKTOTAL, CKMB, CKMBINDEX, TROPONINI in the last 168 hours. BNP: Invalid input(s): POCBNP CBG: Recent Labs  Lab 11/12/24 1637 11/12/24 2119 11/13/24 0025 11/13/24 0458 11/13/24 0806  GLUCAP 95 163* 127* 156* 173*   D-Dimer No results for input(s): DDIMER in the last 72 hours. Hgb A1c No results for input(s): HGBA1C in the last 72 hours. Lipid Profile No results for input(s):  CHOL, HDL, LDLCALC, TRIG, CHOLHDL, LDLDIRECT in the last 72 hours. Thyroid  function studies No results for input(s): TSH, T4TOTAL, T3FREE, THYROIDAB in the last 72 hours.  Invalid input(s): FREET3 Anemia work up No results for input(s): VITAMINB12, FOLATE, FERRITIN, TIBC, IRON , RETICCTPCT in the last 72 hours. Urinalysis    Component Value Date/Time   COLORURINE AMBER (A) 10/29/2024 1723   APPEARANCEUR HAZY (A) 10/29/2024 1723   LABSPEC 1.024 10/29/2024 1723   PHURINE 5.0 10/29/2024 1723   GLUCOSEU NEGATIVE 10/29/2024 1723   HGBUR NEGATIVE 10/29/2024 1723   BILIRUBINUR NEGATIVE 10/29/2024 1723   BILIRUBINUR neg 10/07/2013 0954   KETONESUR NEGATIVE 10/29/2024 1723   PROTEINUR 30 (A) 10/29/2024 1723   UROBILINOGEN negative 10/07/2013 0954   NITRITE NEGATIVE 10/29/2024 1723   LEUKOCYTESUR NEGATIVE 10/29/2024 1723   Sepsis Labs Recent Labs  Lab 11/12/24 0407 11/14/24 0153 11/18/24 0405  WBC 11.3* 8.4 5.5   Microbiology No results found for this or any previous visit (from the past 240 hours).  Procedures/Studies: ECHOCARDIOGRAM  LIMITED Result Date: 11/11/2024    ECHOCARDIOGRAM LIMITED REPORT   Patient Name:   Morgan [name] [facility] Date of Exam: 11/11/2024 Medical Rec #:  [id number]        Height:       64.0 in Accession #:    [id number]       Weight:       156.3 lb Date of Birth:  Nov 14, 1966        BSA:          1.762 m Patient Age:    58 years         BP:           156/74 mmHg Patient Gender: F                HR:           85 bpm. Exam Location:  Inpatient Procedure: Limited Echo, Limited Color Doppler and Cardiac Doppler (Both            Spectral and Color Flow Doppler were utilized during procedure). Indications:    Congestive Heart Failure I50.9  History:        Patient has prior history of Echocardiogram examinations, most                 recent 10/31/2024. CHF, Signs/Symptoms:Altered Mental Status;                 Risk Factors:Sleep Apnea, Diabetes  and Dyslipidemia.  Sonographer:    Koleen Popper RDCS Referring Phys: [id number] JORDAN LEE IMPRESSIONS  1. Left ventricular ejection fraction, by estimation, is 60 to 65%. The left ventricle has normal function. The left ventricle has no regional wall motion abnormalities.  2. Right ventricular systolic function is normal. The right ventricular size is normal.  3. The mitral valve is normal in structure. Trivial mitral valve regurgitation. No evidence of mitral stenosis.  4. The aortic valve is grossly normal.  5. The inferior vena cava is normal in size with <50% respiratory variability, suggesting right atrial pressure of 8 mmHg. Comparison(s): Prior images reviewed side by side. Changes from prior study are noted. EF has normalized compared to prior. FINDINGS  Left Ventricle: Left ventricular ejection fraction, by estimation, is 60 to 65%. The left ventricle has normal function. The left ventricle has no regional wall motion abnormalities. The left ventricular internal cavity size was normal in size. There is  no left ventricular hypertrophy. Right Ventricle: The right ventricular size is normal. Right ventricular systolic function is normal. Mitral Valve: The mitral valve is normal in structure. Trivial mitral valve regurgitation. No evidence of mitral valve stenosis. Tricuspid Valve: The tricuspid valve is normal in structure. Tricuspid valve regurgitation is not demonstrated. No evidence of tricuspid stenosis. Aortic Valve: The aortic valve is grossly normal. Venous: The inferior vena cava is normal in size with less than 50% respiratory variability, suggesting right atrial pressure of 8 mmHg. Additional Comments: Spectral Doppler performed. Color Doppler performed.  LEFT VENTRICLE PLAX 2D LVIDd:         4.90 cm LVIDs:         2.90 cm LV PW:         1.00 cm LV IVS:        0.80 cm LVOT diam:     1.80 cm LV SV:         60 LV SV Index:   34 LVOT Area:     2.54 cm  LV Volumes (MOD) LV vol d,  MOD A2C: 121.0 ml LV  vol d, MOD A4C: 115.0 ml LV vol s, MOD A2C: 45.2 ml LV vol s, MOD A4C: 37.6 ml LV SV MOD A2C:     75.8 ml LV SV MOD A4C:     115.0 ml LV SV MOD BP:      76.4 ml RIGHT VENTRICLE             IVC RV S prime:     18.60 cm/s  IVC diam: 1.70 cm TAPSE (M-mode): 1.9 cm LEFT ATRIUM           Index LA diam:      3.40 cm 1.93 cm/m LA Vol (A2C): 27.3 ml 15.50 ml/m  AORTIC VALVE LVOT Vmax:   132.00 cm/s LVOT Vmean:  89.100 cm/s LVOT VTI:    0.237 m  AORTA Ao Root diam: 2.80 cm MITRAL VALVE MV Area (PHT): 4.21 cm     SHUNTS MV Decel Time: 180 msec     Systemic VTI:  0.24 m MV E velocity: 72.30 cm/s   Systemic Diam: 1.80 cm MV A velocity: 104.00 cm/s MV E/A ratio:  0.70 Shelda Bruckner MD Electronically signed by Shelda Bruckner MD Signature Date/Time: 11/11/2024/5:50:26 PM    Final    DG Chest Port 1 View Result Date: 11/02/2024 EXAM: 1 VIEW(S) XRAY OF THE CHEST 11/02/2024 01:48:08 AM COMPARISON: Earlier today. CLINICAL HISTORY: Acute respiratory failure (HCC). FINDINGS: LINES, TUBES AND DEVICES: The endotracheal tube is 6.4 cm above the carina. Right PICC line tip in the SVC. Right internal jugular line tip in the SVC. LUNGS AND PLEURA: Bilateral pleural effusions with bibasilar airspace opacities, right greater than left, unchanged since prior study. No pneumothorax. HEART AND MEDIASTINUM: No acute abnormality of the cardiac and mediastinal silhouettes. BONES AND SOFT TISSUES: No acute osseous abnormality. IMPRESSION: 1. Bilateral pleural effusions with bibasilar airspace opacities, right greater than left, unchanged since prior study. Electronically signed by: Franky Crease MD 11/02/2024 02:05 AM EST RP Workstation: [id number]   VAS US  UPPER EXTREMITY ARTERIAL DUPLEX Result Date: 11/01/2024  UPPER EXTREMITY DUPLEX STUDY Patient Name:  LORETTO BELINSKY [facility]  Date of Exam:   11/01/2024 Medical Rec #: [id number]         Accession #:    [id number] Date of Birth: 11-16-1966         Patient Gender: F Patient Age:   13  years Exam Location:  [facility] Procedure:      VAS US  UPPER EXTREMITY ARTERIAL DUPLEX Referring Phys: PAULA SIMPSON --------------------------------------------------------------------------------  Indications: Bruising and No radial artery doppler pulse.  Performing Technologist: Ricka Sturdivant-Jones RDMS, RVT  Examination Guidelines: A complete evaluation includes B-mode imaging, spectral Doppler, color Doppler, and power Doppler as needed of all accessible portions of each vessel. Bilateral testing is considered an integral part of a complete examination. Limited examinations for reoccurring indications may be performed as noted.  Left Doppler Findings: +-------------+----------+----------+--------+----------------+ Site         PSV (cm/s)Waveform  StenosisComments         +-------------+----------+----------+--------+----------------+ Brachial Dist31        triphasic                          +-------------+----------+----------+--------+----------------+ Radial Prox  24        triphasic                          +-------------+----------+----------+--------+----------------+ Radial Mid  12        monophasic                         +-------------+----------+----------+--------+----------------+ Radial Dist  0         occluded          appears occluded +-------------+----------+----------+--------+----------------+ Ulnar Prox   34        triphasic                          +-------------+----------+----------+--------+----------------+ Ulnar Mid    22        triphasic                          +-------------+----------+----------+--------+----------------+ Ulnar Dist   19        triphasic                          +-------------+----------+----------+--------+----------------+   Summary:  Left: Left radial artery distal segment appears occluded. Doppler       flow absent. *See table(s) above for measurements and observations. Electronically signed by Fonda Rim on 11/01/2024 at 7:24:58 PM.    Final    VAS US  UPPER EXTREMITY VENOUS DUPLEX Result Date: 11/01/2024 UPPER VENOUS STUDY  Patient Name:  ARLENE BRICKEL [facility]  Date of Exam:   11/01/2024 Medical Rec #: [id number]         Accession #:    [id number] Date of Birth: [birth year]         Patient Gender: F Patient Age:   17 years Exam Location:  [facility] Procedure:      VAS US  UPPER EXTREMITY VENOUS DUPLEX Referring Phys: VINA SIMPSON --------------------------------------------------------------------------------  Indications: ICU patient - fever, septic shock Other Indications: Underwent exploratory laparotomy 10/31/24. Limitations: Line and bandages. Performing Technologist: Ricka Sturdivant-Jones RDMS, RVT  Examination Guidelines: A complete evaluation includes B-mode imaging, spectral Doppler, color Doppler, and power Doppler as needed of all accessible portions of each vessel. Bilateral testing is considered an integral part of a complete examination. Limited examinations for reoccurring indications may be performed as noted.  Right Findings: +----------+------------+---------+-----------+----------+--------------+ RIGHT     CompressiblePhasicitySpontaneousProperties   Summary     +----------+------------+---------+-----------+----------+--------------+ IJV                                                 Not visualized +----------+------------+---------+-----------+----------+--------------+ Subclavian    Full       Yes       Yes                             +----------+------------+---------+-----------+----------+--------------+ Axillary      Full       Yes       Yes                             +----------+------------+---------+-----------+----------+--------------+ Brachial      Full                                                 +----------+------------+---------+-----------+----------+--------------+  Radial        Full                                                  +----------+------------+---------+-----------+----------+--------------+ Ulnar         Full                                                 +----------+------------+---------+-----------+----------+--------------+ Cephalic    Partial                                    Chronic     +----------+------------+---------+-----------+----------+--------------+ Basilic       Full                                                 +----------+------------+---------+-----------+----------+--------------+ IJV not visualized due to tape.  Left Findings: +----------+------------+---------+-----------+----------+-------+ LEFT      CompressiblePhasicitySpontaneousPropertiesSummary +----------+------------+---------+-----------+----------+-------+ IJV           Full       Yes       Yes                      +----------+------------+---------+-----------+----------+-------+ Subclavian    Full       Yes       Yes                      +----------+------------+---------+-----------+----------+-------+ Axillary      Full       Yes       Yes                      +----------+------------+---------+-----------+----------+-------+ Brachial      Full                                          +----------+------------+---------+-----------+----------+-------+ Radial        Full                                          +----------+------------+---------+-----------+----------+-------+ Ulnar         Full                                          +----------+------------+---------+-----------+----------+-------+ Cephalic    Partial                                 Chronic +----------+------------+---------+-----------+----------+-------+ Basilic       Full                                          +----------+------------+---------+-----------+----------+-------+  Summary:  Right: No evidence of deep vein thrombosis in the upper extremity. Findings consistent  with chronic superficial vein thrombosis involving the right cephalic vein.  Left: No evidence of deep vein thrombosis in the upper extremity. Findings consistent with chronic superficial vein thrombosis involving the left cephalic vein.  *See table(s) above for measurements and observations.  Diagnosing physician: Fonda Rim Electronically signed by Fonda Rim on 11/01/2024 at 7:24:42 PM.    Final    VAS US  LOWER EXTREMITY VENOUS (DVT) Result Date: 11/01/2024  Lower Venous DVT Study Patient Name:  MELINE RUSSAW [facility]  Date of Exam:   11/01/2024 Medical Rec #: [id number]         Accession #:    [id number] Date of Birth: 09-20-1966         Patient Gender: F Patient Age:   55 years Exam Location:  [facility] Procedure:      VAS US  LOWER EXTREMITY VENOUS (DVT) Referring Phys: VINA SIMPSON --------------------------------------------------------------------------------  Indications: Fever, septic shock, and Edema.  Comparison Study: 05/24/19 - negative Performing Technologist: Ricka Sturdivant-Jones RDMS, RVT  Examination Guidelines: A complete evaluation includes B-mode imaging, spectral Doppler, color Doppler, and power Doppler as needed of all accessible portions of each vessel. Bilateral testing is considered an integral part of a complete examination. Limited examinations for reoccurring indications may be performed as noted. The reflux portion of the exam is performed with the patient in reverse Trendelenburg.  +---------+---------------+---------+-----------+----------+--------------+ RIGHT    CompressibilityPhasicitySpontaneityPropertiesThrombus Aging +---------+---------------+---------+-----------+----------+--------------+ CFV      Full           Yes      Yes                                 +---------+---------------+---------+-----------+----------+--------------+ SFJ      Full                                                         +---------+---------------+---------+-----------+----------+--------------+ FV Prox  Full                                                        +---------+---------------+---------+-----------+----------+--------------+ FV Mid   Full           Yes      Yes                                 +---------+---------------+---------+-----------+----------+--------------+ FV DistalFull                                                        +---------+---------------+---------+-----------+----------+--------------+ PFV      Full                                                        +---------+---------------+---------+-----------+----------+--------------+  POP      Full           Yes      Yes                                 +---------+---------------+---------+-----------+----------+--------------+ PTV      Partial                                      Chronic        +---------+---------------+---------+-----------+----------+--------------+ PERO     Partial                                      Chronic        +---------+---------------+---------+-----------+----------+--------------+    Summary: RIGHT: - There is no evidence of deep vein thrombosis in the lower extremity.  - No cystic structure found in the popliteal fossa.  LEFT: - Findings consistent with chronic deep vein thrombosis involving the left posterior tibial veins, and left peroneal veins.  - No cystic structure found in the popliteal fossa.  *See table(s) above for measurements and observations. Electronically signed by Fonda Rim on 11/01/2024 at 7:24:25 PM.    Final    DG CHEST PORT 1 VIEW Result Date: 11/01/2024 EXAM: 1 VIEW(S) XRAY OF THE CHEST 11/01/2024 11:08:00 AM COMPARISON: 10/30/2024 CLINICAL HISTORY: Acute hypoxic respiratory failure (HCC) [id number] FINDINGS: LINES, TUBES AND DEVICES: Right IJ CVC in place with tip overlying distal SVC. Endotracheal tube in place with tip 7.6 cm above carina. LUNGS  AND PLEURA: Right basilar atelectasis or consolidation, new from prior exam. New Small to moderate right pleural effusion. Unchanged changed small left pleural effusions. No pneumothorax. HEART AND MEDIASTINUM: No acute abnormality of the cardiac and mediastinal silhouettes. BONES AND SOFT TISSUES: Left shoulder prosthesis noted. Right upper quadrant surgical clips noted. No acute osseous abnormality. IMPRESSION: 1. New right basilar atelectasis or consolidation with small to moderate right pleural effusion. 2. Unchanged small left pleural effusion. Electronically signed by: Waddell Calk MD 11/01/2024 02:10 PM EST RP Workstation: [id number]   US  EKG SITE RITE Result Date: 11/01/2024 If Site Rite image not attached, placement could not be confirmed due to current cardiac rhythm.  ECHOCARDIOGRAM COMPLETE Result Date: 11/01/2024    ECHOCARDIOGRAM REPORT   Patient Name:   Morgan [name] [facility] Date of Exam: 10/31/2024 Medical Rec #:  [id number]        Height:       64.0 in Accession #:    [id number]       Weight:       132.0 lb Date of Birth:  [birth year]        BSA:          1.640 m Patient Age:    58 years         BP:           130//80 mmHg Patient Gender: F                HR:           93 bpm. Exam Location:  Inpatient Procedure: 2D Echo and Intracardiac Opacification Agent (Both Spectral and Color            Flow Doppler were utilized during procedure). Indications:  Bacteremia  History:        Patient has prior history of Echocardiogram examinations.  Sonographer:    Charmaine Gaskins Referring Phys: [phone] KELLY OSBORNE  Sonographer Comments: Technically difficult study due to poor echo windows, Technically challenging study due to limited acoustic windows and echo performed with patient supine and on artificial respirator. IMPRESSIONS  1. Left ventricular ejection fraction, by estimation, is 20 to 25%. The left ventricle has severely decreased function. The left ventricle demonstrates regional wall motion  abnormalities (see scoring diagram/findings for description). The left ventricular internal cavity size was mildly dilated. Left ventricular diastolic function could not be evaluated.  2. Right ventricular systolic function is normal. The right ventricular size is normal.  3. The mitral valve is grossly normal. Trivial mitral valve regurgitation. No evidence of mitral stenosis.  4. The aortic valve is grossly normal. Aortic valve regurgitation is not visualized. No aortic stenosis is present. Conclusion(s)/Recommendation(s): Regional wall motion abnormalities may be consistent with stress cardiomyopathy in the absence of obstructive CAD. FINDINGS  Left Ventricle: Left ventricular ejection fraction, by estimation, is 20 to 25%. The left ventricle has severely decreased function. The left ventricle demonstrates regional wall motion abnormalities. Definity  contrast agent was given IV to delineate the left ventricular endocardial borders. The left ventricular internal cavity size was mildly dilated. There is no left ventricular hypertrophy. Left ventricular diastolic function could not be evaluated.  LV Wall Scoring: The mid and distal lateral wall, mid and distal anterior septum, mid inferoseptal segment, apical anterior segment, and apical inferior segment are akinetic. The mid anterolateral segment, mid anterior segment, and mid inferior segment are hypokinetic. The basal anteroseptal segment, basal inferolateral segment, basal anterolateral segment, basal anterior segment, basal inferior segment, and basal inferoseptal segment are normal. Right Ventricle: The right ventricular size is normal. No increase in right ventricular wall thickness. Right ventricular systolic function is normal. Left Atrium: Left atrial size was normal in size. Right Atrium: Right atrial size was normal in size. Pericardium: Trivial pericardial effusion is present. Mitral Valve: The mitral valve is grossly normal. Trivial mitral valve  regurgitation. No evidence of mitral valve stenosis. Tricuspid Valve: The tricuspid valve is grossly normal. Tricuspid valve regurgitation is trivial. No evidence of tricuspid stenosis. Aortic Valve: The aortic valve is grossly normal. Aortic valve regurgitation is not visualized. No aortic stenosis is present. Pulmonic Valve: The pulmonic valve was not well visualized. Pulmonic valve regurgitation is trivial. No evidence of pulmonic stenosis. Aorta: The aortic root was not well visualized. Venous: IVC assessment for right atrial pressure unable to be performed due to mechanical ventilation. IAS/Shunts: The interatrial septum was not well visualized.   LV Volumes (MOD) LV vol d, MOD A2C: 119.0 ml LV vol d, MOD A4C: 116.0 ml LV vol s, MOD A2C: 93.7 ml LV vol s, MOD A4C: 100.0 ml LV SV MOD A2C:     25.3 ml LV SV MOD A4C:     116.0 ml LV SV MOD BP:      19.8 ml RIGHT VENTRICLE RV Basal diam:  2.60 cm RV Mid diam:    2.50 cm TAPSE (M-mode): 2.0 cm LEFT ATRIUM             Index        RIGHT ATRIUM           Index LA Vol (A2C):   42.4 ml 25.86 ml/m  RA Area:     10.60 cm LA Vol (A4C):   41.5 ml 25.31 ml/m  RA Volume:   24.90 ml  15.19 ml/m LA Biplane Vol: 42.3 ml 25.80 ml/m  TRICUSPID VALVE TR Peak grad:   18.8 mmHg TR Vmax:        217.00 cm/s Soyla Merck MD Electronically signed by Soyla Merck MD Signature Date/Time: 11/01/2024/12:16:40 AM    Final    US  EKG SITE RITE Result Date: 10/31/2024 If Site Rite image not attached, placement could not be confirmed due to current cardiac rhythm.  DG CHEST PORT 1 VIEW Result Date: 10/30/2024 EXAM: 1 VIEW(S) XRAY OF THE CHEST 10/30/2024 12:22:00 AM COMPARISON: 10/29/2024 CLINICAL HISTORY: Intubation of airway performed without difficulty. FINDINGS: LINES, TUBES AND DEVICES: Endotracheal tube in place with tip 4 cm above the carina. Right IJ central venous catheter in place with tip overlying the cavoatrial junction region. LUNGS AND PLEURA: Low lung volume.  Hazy opacities in left lower lung zone, likely representing atelectasis and/or pneumonia. Small left pleural effusion. No pneumothorax. HEART AND MEDIASTINUM: No acute abnormality of the cardiac and mediastinal silhouettes. BONES AND SOFT TISSUES: Left shoulder arthroplasty noted. Surgical clips in right upper quadrant, consistent with prior cholecystectomy. IMPRESSION: 1. Hazy opacities in the left lower lung zone, likely representing atelectasis and/or pneumonia. 2. Small left pleural effusion. Electronically signed by: Morgane Naveau MD 10/30/2024 12:32 AM EST RP Workstation: [id number]   CT ABDOMEN PELVIS W CONTRAST Result Date: 10/29/2024 CLINICAL DATA:  Mid abdominal pain, nausea, and vomiting EXAM: CT ABDOMEN AND PELVIS WITH CONTRAST TECHNIQUE: Multidetector CT imaging of the abdomen and pelvis was performed using the standard protocol following bolus administration of intravenous contrast. RADIATION DOSE REDUCTION: This exam was performed according to the departmental dose-optimization program which includes automated exposure control, adjustment of the mA and/or kV according to patient size and/or use of iterative reconstruction technique. CONTRAST:  OMNIPAQUE  IOHEXOL  300 MG/ML  SOLN COMPARISON:  CT abdomen and pelvis dated 03/19/2019 FINDINGS: Lower chest: Bilateral lower lobe subsegmental atelectasis. Trace bilateral pleural effusions. Partially imaged heart size is normal. Hepatobiliary: Atrophic left hepatic lobe. No intra or extrahepatic biliary ductal dilation. Cholecystectomy. Pancreas: No focal lesions or main ductal dilation. Spleen: 9 mm hypodensity in the spleen (2:29), likely benign cyst. Adrenals/Urinary Tract: No adrenal nodules. No suspicious renal mass, calculi or hydronephrosis. 10 mm minimally complicated cyst in the anterior interpolar left kidney (11:12). No focal bladder wall thickening. Stomach/Bowel: Postsurgical changes of Roux-en-Y gastric bypass. Marked dilation of the  excluded stomach and duodenum to the level of the duodenojejunal junction, where there is abrupt luminal caliber transition (9:118). There is also apparent tethering of the gastric body at this level (7:50) as well as marked luminal narrowing of the transverse colon (2:42). Mural thickening and enhancement of the additional small bowel loops throughout the abdomen, many of which are underdistended, predominantly in the left lower quadrant. Distal transverse, descending, and rectosigmoid colon are underdistended. Appendix is not discretely seen. Vascular/Lymphatic: Aortic atherosclerosis. No enlarged abdominal or pelvic lymph nodes. Reproductive: Subtly delineated right adnexal cystic structure measures 3.4 cm (2:74). No left adnexal mass. Other: Moderate volume ascites.  No free air. Musculoskeletal: No acute or abnormal lytic or blastic osseous lesions. Postsurgical changes of L3-5 spinal fusion. Hardware appears intact. Multilevel degenerative changes of the partially imaged thoracic and lumbar spine. IMPRESSION: 1. Marked dilation of the excluded stomach and duodenum to the level of the duodenojejunal junction, where there is abrupt luminal caliber transition associated with apparent tethering of the gastric body as well as marked luminal narrowing of the transverse  colon. Findings are suspicious for obstruction of the excluded stomach and duodenum, likely secondary to adhesions. Of note, an adhesion was demonstrated causing obstruction in this area when the patient last presented with similar CT appearance on 03/19/2019. Recommend surgical consultation. 2. Mural thickening and enhancement of the additional small bowel loops throughout the abdomen, predominantly in the left lower quadrant, which may be reactive or reflect enteritis. 3. Moderate volume ascites. 4. Subtly delineated right adnexal cystic structure measures 3.4 cm. Recommend nonemergent pelvic ultrasound for further evaluation. 5. Trace bilateral  pleural effusions. 6.  Aortic Atherosclerosis (ICD10-I70.0). Electronically Signed   By: Limin  Xu M.D.   On: 10/29/2024 19:17   DG Abdomen Acute W/Chest Result Date: 10/29/2024 EXAM: UPRIGHT AND SUPINE XRAY VIEWS OF THE ABDOMEN AND 4 VIEW(S) OF THE CHEST 10/29/2024 04:31:00 PM COMPARISON: 07/21/2018 CLINICAL HISTORY: vomiting with epigastric abdominal pain FINDINGS: LUNGS AND PLEURA: No consolidation or pulmonary edema. No pleural effusion or pneumothorax. HEART AND MEDIASTINUM: No acute abnormality of the cardiac and mediastinal silhouettes. BOWEL: The bowel gas pattern is nonspecific. No bowel obstruction. PERITONEUM AND SOFT TISSUES: Status post cholecystectomy. Possible free air is noted along the left abdominal wall versus air-filled colon. CT scan is recommended for further evaluation. No abnormal calcifications. BONES: No acute osseous abnormality. IMPRESSION: 1. Possible free intraperitoneal air along the left abdominal wall versus air within the colon. 2. CT abdomen and pelvis is recommended for further evaluation. Electronically signed by: Lynwood Seip MD 10/29/2024 04:46 PM EST RP Workstation: [id number]     Time coordinating discharge: Over 30 minutes    Alm Apo, MD  [facility] 11/18/2024, 12:45 PM

## 2024-11-18 NOTE — Progress Notes (Signed)
 Physical Therapy Treatment Patient Details Name: Morgan [name] MRN: [id number] DOB: October 17, 1966 Today's Date: 11/18/2024   History of Present Illness 58 yo F adm 10/29/24 with SBO s/p ex lap with g-tube placement same date. 11/20 and 11/22 repeat ex lap with VAC. VDRF 11/18-11/24. Pressor support with thrombosed radial artery s/p Lt radial artery embolectomy11/22. PMH: SBO, gastric bypass, DM, OSA, HLD, hypotension, bradycardia, Lt TSA    PT Comments  Continuing work on functional mobility and activity tolerance;  Session focused on progressive amb with good progress; able to walk in the hallway without supplememtal O2; Overall progressing well; Anticipate continuing good progress at post-acute rehabilitation.     If plan is discharge home, recommend the following: Assist for transportation;Assistance with cooking/housework;Help with stairs or ramp for entrance;A little help with bathing/dressing/bathroom;A lot of help with walking and/or transfers;Supervision due to cognitive status   Can travel by private vehicle        Equipment Recommendations  BSC/3in1;Rollator (4 wheels)    Recommendations for Other Services       Precautions / Restrictions Precautions Precautions: Fall;Other (comment) Recall of Precautions/Restrictions: Intact Precaution/Restrictions Comments: PICC Required Braces or Orthoses: Other Brace Other Brace: L intrinsic plus resting hand splint - night wear only Restrictions Weight Bearing Restrictions Per Provider Order: No Other Position/Activity Restrictions: No formal orders from embolectomy     Mobility  Bed Mobility Overal bed mobility: Needs Assistance Bed Mobility: Sidelying to Sit, Rolling Rolling: Contact guard assist Sidelying to sit: Contact guard assist       General bed mobility comments: Incr time and use of bedrails    Transfers Overall transfer level: Needs assistance Equipment used: Rolling walker (2 wheels) Transfers: Sit to/from  Stand Sit to Stand: Contact guard assist           General transfer comment: Cues for hand placement    Ambulation/Gait Ambulation/Gait assistance: Min assist, Contact guard assist Gait Distance (Feet): 70 Feet (x2) Assistive device: Rolling walker (2 wheels) Gait Pattern/deviations: Step-through pattern Gait velocity: decr     General Gait Details: Decr heel strike bilaterally; short steps; cues for posture   Stairs             Wheelchair Mobility     Tilt Bed    Modified Rankin (Stroke Patients Only)       Balance     Sitting balance-Leahy Scale: Fair       Standing balance-Leahy Scale: Poor                              Communication Communication Communication: No apparent difficulties  Cognition Arousal: Alert Behavior During Therapy: WFL for tasks assessed/performed   PT - Cognitive impairments: Initiation, Problem solving, Safety/Judgement, Attention                       PT - Cognition Comments: Improving initiation Following commands: Intact      Cueing Cueing Techniques: Verbal cues  Exercises      General Comments General comments (skin integrity, edema, etc.): Walked on room air, and O2 sats stayed at or above 90%; noteworthy DOE, 2/4      Pertinent Vitals/Pain Pain Assessment Pain Assessment: Faces Faces Pain Scale: Hurts a little bit Pain Location: abdomen; L digit ROM Pain Descriptors / Indicators: Discomfort Pain Intervention(s): Monitored during session    Home Living  Prior Function            PT Goals (current goals can now be found in the care plan section) Acute Rehab PT Goals Patient Stated Goal: to get up PT Goal Formulation: Patient unable to participate in goal setting Time For Goal Achievement: 11/18/24 Potential to Achieve Goals: Fair Progress towards PT goals: Progressing toward goals    Frequency    Min 3X/week      PT Plan       Co-evaluation              AM-PAC PT 6 Clicks Mobility   Outcome Measure  Help needed turning from your back to your side while in a flat bed without using bedrails?: A Little Help needed moving from lying on your back to sitting on the side of a flat bed without using bedrails?: A Lot Help needed moving to and from a bed to a chair (including a wheelchair)?: A Lot Help needed standing up from a chair using your arms (e.g., wheelchair or bedside chair)?: A Little Help needed to walk in hospital room?: A Little Help needed climbing 3-5 steps with a railing? : Total 6 Click Score: 14    End of Session   Activity Tolerance: Patient tolerated treatment well Patient left: in bed;with call bell/phone within reach Nurse Communication: Mobility status PT Visit Diagnosis: Other abnormalities of gait and mobility (R26.89);Muscle weakness (generalized) (M62.81);Difficulty in walking, not elsewhere classified (R26.2)     Time: 8993-8972 PT Time Calculation (min) (ACUTE ONLY): 21 min  Charges:    $Gait Training: 8-22 mins PT General Charges $$ ACUTE PT VISIT: 1 Visit                     Silvano Currier, PT  Acute Rehabilitation Services Office [phone] Secure Chat welcomed    Silvano VEAR Currier 11/18/2024, 12:47 PM

## 2024-11-18 NOTE — Plan of Care (Signed)
  Problem: Clinical Measurements: Goal: Ability to maintain clinical measurements within normal limits will improve Outcome: Progressing   Problem: Coping: Goal: Level of anxiety will decrease Outcome: Progressing   Problem: Elimination: Goal: Will not experience complications related to bowel motility Outcome: Progressing   Problem: Skin Integrity: Goal: Risk for impaired skin integrity will decrease Outcome: Progressing

## 2024-11-18 NOTE — TOC Transition Note (Signed)
 Transition of Care [facility]) - Discharge Note   Patient Details  Name: Morgan [name] MRN: [id number] Date of Birth: [birth year]  Transition of Care [facility]) CM/SW Contact:  Jeoffrey LITTIE Moose, [place] Phone Number: 11/18/2024, 1:16 PM   Clinical Narrative:    Patient will DC to: [facility] Anticipated DC date: 11/18/24 Family notified: Yes Transport by: [place]   Per MD patient ready for DC to [facility]. RN to call report prior to discharge [phone] . RN, patient, patient's family, and facility notified of DC. Discharge Summary and FL2 sent to facility. DC packet on chart. Ambulance transport requested for patient.   CSW will sign off for now as social work intervention is no longer needed. Please consult us  again if new needs arise.     Final next level of care: Skilled Nursing Facility Barriers to Discharge: Barriers Resolved   Patient Goals and CMS Choice Patient states their goals for this hospitalization and ongoing recovery are:: SNF CMS Medicare.gov Compare Post Acute Care list provided to:: Patient Represenative (must comment) (Jessica(HCPOA, niece)) Choice offered to / list presented to : [facility] POA / Guardian [place] ownership interest in [facility].provided to:: [facility] POA / Guardian    Discharge Placement   Existing PASRR number confirmed : 11/18/24          Patient chooses bed at: [facility] Patient to be transferred to facility by: [facility] Name of family member notified: Harlene Patient and family notified of of transfer: 11/18/24  Discharge Plan and Services Additional resources added to the After Visit Summary for   In-house Referral:  (rw) Discharge Planning Services: CM Consult                                 Social Drivers of Health (SDOH) Interventions SDOH Screenings   Food Insecurity: Patient Unable To Answer (11/02/2024)  Housing: Patient Unable To Answer (11/02/2024)   Transportation Needs: Patient Unable To Answer (11/02/2024)  Utilities: Patient Unable To Answer (11/02/2024)  Alcohol  Screen: Low Risk  (09/23/2024)  Depression (PHQ2-9): Medium Risk (06/28/2024)  Financial Resource Strain: Medium Risk (09/23/2024)  Physical Activity: Insufficiently Active (09/23/2024)  Social Connections: Patient Unable To Answer (11/02/2024)  Recent Concern: Social Connections - Moderately Isolated (09/23/2024)  Stress: Stress Concern Present (09/23/2024)  Tobacco Use: Low Risk  (10/29/2024)  Health Literacy: Adequate Health Literacy (09/23/2024)     Readmission Risk Interventions     No data to display

## 2024-11-18 NOTE — Progress Notes (Signed)
 PICC Removal Note: PICC line removed from RUE. PICC catheter tip visualized and intact. Pressure dressing applied. No redness, ecchymosis, edema, swelling, or drainage noted at site. Instructions provided on post PICC discharge care, including followup notification instructions.  Bedrest until noon.

## 2024-11-18 NOTE — Progress Notes (Signed)
 Progress Note  16 Days Post-Op  Subjective: Patient reports doing well overall. Tolerating soft diet. Having bowel function with flatulence. Having manageable pain with mobility but otherwise no significant concerns.Denies n/v.  ROS  All negative with the exception of above.  Objective: Vital signs in last 24 hours: Temp:  [97.9 F (36.6 C)-98.1 F (36.7 C)] 98 F (36.7 C) (12/08 0603) Pulse Rate:  [67-82] 69 (12/08 0603) Resp:  [16-18] 16 (12/08 0603) BP: (103-126)/(57-64) 103/57 (12/08 0603) SpO2:  [95 %-99 %] 99 % (12/08 0603) Last BM Date : 11/17/24  Intake/Output from previous day: 12/07 0701 - 12/08 0700 In: 540 [P.O.:420; NG/GT:120] Out: 300 [Urine:300] Intake/Output this shift: No intake/output data recorded.  PE: General: Pleasant female who is sitting in chair at bedside in NAD. HEENT: Head is normocephalic, atraumatic. Heart: HR normal during encounter. Lungs: Respiratory effort nonlabored. Abd: Soft, ND, NT. Midline wound covered with dressing that has been exchanged this morning. G-tube present with sutures. No rebound tenderness or guarding.  Skin: Warm and dry.   Lab Results:  Recent Labs    11/18/24 0405  WBC 5.5  HGB 7.9*  HCT 25.7*  PLT 677*   BMET Recent Labs    11/16/24 0208  NA 137  K 4.1  CL 99  CO2 32  GLUCOSE 124*  BUN 27*  CREATININE 0.41*  CALCIUM  8.2*   PT/INR No results for input(s): LABPROT, INR in the last 72 hours. CMP     Component Value Date/Time   NA 137 11/16/2024 0208   NA 142 09/23/2024 1030   K 4.1 11/16/2024 0208   CL 99 11/16/2024 0208   CO2 32 11/16/2024 0208   GLUCOSE 124 (H) 11/16/2024 0208   BUN 27 (H) 11/16/2024 0208   BUN 17 09/23/2024 1030   CREATININE 0.41 (L) 11/16/2024 0208   CREATININE 0.82 06/18/2013 1009   CALCIUM  8.2 (L) 11/16/2024 0208   PROT 4.1 (L) 11/04/2024 0500   PROT 6.5 06/28/2024 0842   ALBUMIN  <1.5 (L) 11/04/2024 0500   ALBUMIN  4.5 06/28/2024 0842   AST 88 (H)  11/04/2024 0500   ALT 64 (H) 11/04/2024 0500   ALKPHOS 48 11/04/2024 0500   BILITOT 0.8 11/04/2024 0500   BILITOT 0.3 06/28/2024 0842   GFRNONAA >60 11/16/2024 0208   GFRNONAA 87 06/18/2013 1009   GFRAA >60 08/04/2020 1038   GFRAA >89 06/18/2013 1009   Lipase     Component Value Date/Time   LIPASE 22 11/01/2024 0450       Studies/Results: No results found.  Anti-infectives: Anti-infectives (From admission, onward)    Start     Dose/Rate Route Frequency Ordered Stop   10/31/24 1400  piperacillin -tazobactam (ZOSYN ) IVPB 3.375 g        3.375 g 12.5 mL/hr over 240 Minutes Intravenous Every 8 hours 10/31/24 0751 11/08/24 0029   10/31/24 1100  micafungin  (MYCAMINE ) 100 mg in sodium chloride  0.9 % 100 mL IVPB        100 mg 105 mL/hr over 1 Hours Intravenous Every 24 hours 10/31/24 0901 11/04/24 1354   10/30/24 0600  piperacillin -tazobactam (ZOSYN ) IVPB 3.375 g  Status:  Discontinued        3.375 g 12.5 mL/hr over 240 Minutes Intravenous Every 8 hours 10/29/24 2350 10/31/24 0751   10/29/24 2145  piperacillin -tazobactam (ZOSYN ) IVPB 3.375 g        3.375 g 100 mL/hr over 30 Minutes Intravenous  Once 10/29/24 2137 10/29/24 2205   10/29/24 1700  cefTRIAXone  (ROCEPHIN ) 1 g in sodium chloride  0.9 % 100 mL IVPB  Status:  Discontinued        1 g 200 mL/hr over 30 Minutes Intravenous  Once 10/29/24 1646 10/29/24 1656   10/29/24 1700  metroNIDAZOLE  (FLAGYL ) IVPB 500 mg        500 mg 100 mL/hr over 60 Minutes Intravenous  Once 10/29/24 1646 10/29/24 1809   10/29/24 1700  cefTRIAXone  (ROCEPHIN ) 2 g in sodium chloride  0.9 % 100 mL IVPB        2 g 200 mL/hr over 30 Minutes Intravenous  Once 10/29/24 1656 10/29/24 1733        Assessment/Plan POD 20, s/p ex lap with LOA, g-tube placement in gastric remnant Dr. Vernetta 11/18 secondary to SBO with possible perforation POD 18, s/p ex lap with washout, Dr. Tanda 11/20 POD 16, s/p washout, abdominal closure Dr. Tanda 11/22 -  Afebrile. - Tolerating soft diet. Having bowel function. Pain manageable and improving. - Continue WD dressing changes BID to midline wound. - Ongoing TF. Continue until follow up with us .    FEN - Soft diet/TF VTE - Eliquis   ID - Micafungin  5 doses completed; Zosyn  11/20>11/28   - per CCM -  E coli bacteremia -completed 1 week of Zosyn  Septic shock -resolved CAD/Heart failure - EF 20-25% on echo 11/21 AKI - resolved DM - SSI Leukopenia - resolved, secondary to sepsis Thrombocytopenia - resolved Hypocalcemia - per CCM Neck wound  L hand ischemic changes - Vascular following     LOS: 20 days   I reviewed specialist notes, nursing notes, physician notes, hospitalist notes, last 24 h vitals and pain scores, last 48 h intake and output, last 24 h labs and trends, and last 24 h imaging results.   Marjorie Carlyon Favre, [facility] 11/18/2024, 9:36 AM Please see Amion for pager number during day hours 7:00am-4:30pm

## 2024-11-18 NOTE — Plan of Care (Signed)
 Problem: Education: Goal: Knowledge of General Education information will improve Description: Including pain rating scale, medication(s)/side effects and non-pharmacologic comfort measures Outcome: Adequate for Discharge   Problem: Health Behavior/Discharge Planning: Goal: Ability to manage health-related needs will improve Outcome: Adequate for Discharge   Problem: Clinical Measurements: Goal: Ability to maintain clinical measurements within normal limits will improve Outcome: Adequate for Discharge Goal: Will remain free from infection Outcome: Adequate for Discharge Goal: Diagnostic test results will improve Outcome: Adequate for Discharge Goal: Respiratory complications will improve Outcome: Adequate for Discharge Goal: Cardiovascular complication will be avoided Outcome: Adequate for Discharge   Problem: Activity: Goal: Risk for activity intolerance will decrease Outcome: Adequate for Discharge   Problem: Clinical Measurements: Goal: Ability to maintain clinical measurements within normal limits will improve Outcome: Adequate for Discharge Goal: Will remain free from infection Outcome: Adequate for Discharge Goal: Diagnostic test results will improve Outcome: Adequate for Discharge Goal: Respiratory complications will improve Outcome: Adequate for Discharge Goal: Cardiovascular complication will be avoided Outcome: Adequate for Discharge   Problem: Activity: Goal: Risk for activity intolerance will decrease Outcome: Adequate for Discharge   Problem: Nutrition: Goal: Adequate nutrition will be maintained Outcome: Adequate for Discharge   Problem: Coping: Goal: Level of anxiety will decrease Outcome: Adequate for Discharge   Problem: Activity: Goal: Risk for activity intolerance will decrease Outcome: Adequate for Discharge   Problem: Nutrition: Goal: Adequate nutrition will be maintained Outcome: Adequate for Discharge   Problem: Coping: Goal: Level of  anxiety will decrease Outcome: Adequate for Discharge   Problem: Elimination: Goal: Will not experience complications related to bowel motility Outcome: Adequate for Discharge Goal: Will not experience complications related to urinary retention Outcome: Adequate for Discharge   Problem: Pain Managment: Goal: General experience of comfort will improve and/or be controlled Outcome: Adequate for Discharge   Problem: Pain Managment: Goal: General experience of comfort will improve and/or be controlled Outcome: Adequate for Discharge   Problem: Safety: Goal: Ability to remain free from injury will improve Outcome: Adequate for Discharge   Problem: Pain Managment: Goal: General experience of comfort will improve and/or be controlled Outcome: Adequate for Discharge   Problem: Safety: Goal: Ability to remain free from injury will improve Outcome: Adequate for Discharge   Problem: Skin Integrity: Goal: Risk for impaired skin integrity will decrease Outcome: Adequate for Discharge   Problem: Activity: Goal: Ability to tolerate increased activity will improve Outcome: Adequate for Discharge   Problem: Skin Integrity: Goal: Risk for impaired skin integrity will decrease Outcome: Adequate for Discharge   Problem: Activity: Goal: Ability to tolerate increased activity will improve Outcome: Adequate for Discharge   Problem: Respiratory: Goal: Ability to maintain a clear airway and adequate ventilation will improve Outcome: Adequate for Discharge   Problem: Respiratory: Goal: Ability to maintain a clear airway and adequate ventilation will improve Outcome: Adequate for Discharge   Problem: Role Relationship: Goal: Method of communication will improve Outcome: Adequate for Discharge   Problem: Respiratory: Goal: Ability to maintain a clear airway and adequate ventilation will improve Outcome: Adequate for Discharge   Problem: Role Relationship: Goal: Method of  communication will improve Outcome: Adequate for Discharge   Problem: Education: Goal: Ability to describe self-care measures that may prevent or decrease complications (Diabetes Survival Skills Education) will improve Outcome: Adequate for Discharge Goal: Individualized Educational Video(s) Outcome: Adequate for Discharge   Problem: Coping: Goal: Ability to adjust to condition or change in health will improve Outcome: Adequate for Discharge   Problem: Coping: Goal:  Ability to adjust to condition or change in health will improve Outcome: Adequate for Discharge   Problem: Fluid Volume: Goal: Ability to maintain a balanced intake and output will improve Outcome: Adequate for Discharge   Problem: Coping: Goal: Ability to adjust to condition or change in health will improve Outcome: Adequate for Discharge   Problem: Fluid Volume: Goal: Ability to maintain a balanced intake and output will improve Outcome: Adequate for Discharge   Problem: Health Behavior/Discharge Planning: Goal: Ability to identify and utilize available resources and services will improve Outcome: Adequate for Discharge Goal: Ability to manage health-related needs will improve Outcome: Adequate for Discharge   Problem: Fluid Volume: Goal: Ability to maintain a balanced intake and output will improve Outcome: Adequate for Discharge   Problem: Health Behavior/Discharge Planning: Goal: Ability to identify and utilize available resources and services will improve Outcome: Adequate for Discharge Goal: Ability to manage health-related needs will improve Outcome: Adequate for Discharge   Problem: Metabolic: Goal: Ability to maintain appropriate glucose levels will improve Outcome: Adequate for Discharge   Problem: Health Behavior/Discharge Planning: Goal: Ability to identify and utilize available resources and services will improve Outcome: Adequate for Discharge Goal: Ability to manage health-related needs  will improve Outcome: Adequate for Discharge   Problem: Metabolic: Goal: Ability to maintain appropriate glucose levels will improve Outcome: Adequate for Discharge   Problem: Health Behavior/Discharge Planning: Goal: Ability to identify and utilize available resources and services will improve Outcome: Adequate for Discharge Goal: Ability to manage health-related needs will improve Outcome: Adequate for Discharge   Problem: Metabolic: Goal: Ability to maintain appropriate glucose levels will improve Outcome: Adequate for Discharge   Problem: Nutritional: Goal: Maintenance of adequate nutrition will improve Outcome: Adequate for Discharge Goal: Progress toward achieving an optimal weight will improve Outcome: Adequate for Discharge   Problem: Nutritional: Goal: Maintenance of adequate nutrition will improve Outcome: Adequate for Discharge Goal: Progress toward achieving an optimal weight will improve Outcome: Adequate for Discharge   Problem: Metabolic: Goal: Ability to maintain appropriate glucose levels will improve Outcome: Adequate for Discharge   Problem: Nutritional: Goal: Maintenance of adequate nutrition will improve Outcome: Adequate for Discharge Goal: Progress toward achieving an optimal weight will improve Outcome: Adequate for Discharge   Problem: Skin Integrity: Goal: Risk for impaired skin integrity will decrease Outcome: Adequate for Discharge   Problem: Nutritional: Goal: Maintenance of adequate nutrition will improve Outcome: Adequate for Discharge Goal: Progress toward achieving an optimal weight will improve Outcome: Adequate for Discharge   Problem: Skin Integrity: Goal: Risk for impaired skin integrity will decrease Outcome: Adequate for Discharge   Problem: Tissue Perfusion: Goal: Adequacy of tissue perfusion will improve Outcome: Adequate for Discharge   Problem: Tissue Perfusion: Goal: Adequacy of tissue perfusion will  improve Outcome: Adequate for Discharge   Problem: Malnutrition  (NI-5.2) Goal: Food and/or nutrient delivery Description: Individualized approach for food/nutrient provision. Outcome: Adequate for Discharge   Problem: Malnutrition  (NI-5.2) Goal: Food and/or nutrient delivery Description: Individualized approach for food/nutrient provision. Outcome: Adequate for Discharge   Problem: Acute Rehab PT Goals(only PT should resolve) Goal: Pt Will Go Supine/Side To Sit Outcome: Adequate for Discharge Goal: Pt Will Go Sit To Supine/Side Outcome: Adequate for Discharge Goal: Patient Will Transfer Sit To/From Stand Outcome: Adequate for Discharge Goal: Pt Will Transfer Bed To Chair/Chair To Bed Outcome: Adequate for Discharge Goal: Pt Will Ambulate Outcome: Adequate for Discharge   Problem: Acute Rehab PT Goals(only PT should resolve) Goal: Pt Will Go Supine/Side  To Sit Outcome: Adequate for Discharge Goal: Pt Will Go Sit To Supine/Side Outcome: Adequate for Discharge Goal: Patient Will Transfer Sit To/From Stand Outcome: Adequate for Discharge Goal: Pt Will Transfer Bed To Chair/Chair To Bed Outcome: Adequate for Discharge Goal: Pt Will Ambulate Outcome: Adequate for Discharge

## 2024-11-18 NOTE — Progress Notes (Signed)
  Progress Note    11/18/2024 10:11 AM 16 Days Post-Op  Subjective: Says she is ready to go to her facility.  Denies any pain in her left thumb.  Endorses some soreness of the left thumb.    Vitals:   11/18/24 0603 11/18/24 1004  BP: (!) 103/57 106/74  Pulse: 69 84  Resp: 16 16  Temp: 98 F (36.7 C) 98.5 F (36.9 C)  SpO2: 99% 100%    Physical Exam: General: Resting in bed, NAD Cardiac: Regular rate and rhythm Lungs: Nonlabored, on room air Incisions: Left wrist incision well-healed without signs of infection, all staples removed Extremities: Palpable left radial pulse.  Intact motor and sensation of left 2nd through 5th fingers.  Demarcating left thumb with some stiffness  CBC    Component Value Date/Time   WBC 5.5 11/18/2024 0405   RBC 2.91 (L) 11/18/2024 0405   HGB 7.9 (L) 11/18/2024 0405   HGB 13.4 06/28/2024 0842   HCT 25.7 (L) 11/18/2024 0405   HCT 44.0 06/28/2024 0842   PLT 677 (H) 11/18/2024 0405   PLT 260 06/28/2024 0842   MCV 88.3 11/18/2024 0405   MCV 97 06/28/2024 0842   MCH 27.1 11/18/2024 0405   MCHC 30.7 11/18/2024 0405   RDW 14.9 11/18/2024 0405   RDW 13.0 06/28/2024 0842   LYMPHSABS 0.4 (L) 10/31/2024 0736   LYMPHSABS 1.4 08/16/2019 0824   MONOABS 0.3 10/31/2024 0736   EOSABS 0.1 10/31/2024 0736   EOSABS 0.1 08/16/2019 0824   BASOSABS 0.0 10/31/2024 0736   BASOSABS 0.0 08/16/2019 0824    BMET    Component Value Date/Time   NA 137 11/16/2024 0208   NA 142 09/23/2024 1030   K 4.1 11/16/2024 0208   CL 99 11/16/2024 0208   CO2 32 11/16/2024 0208   GLUCOSE 124 (H) 11/16/2024 0208   BUN 27 (H) 11/16/2024 0208   BUN 17 09/23/2024 1030   CREATININE 0.41 (L) 11/16/2024 0208   CREATININE 0.82 06/18/2013 1009   CALCIUM  8.2 (L) 11/16/2024 0208   GFRNONAA >60 11/16/2024 0208   GFRNONAA 87 06/18/2013 1009   GFRAA >60 08/04/2020 1038   GFRAA >89 06/18/2013 1009    INR    Component Value Date/Time   INR 0.9 11/02/2024 0853      Intake/Output Summary (Last 24 hours) at 11/18/2024 1011 Last data filed at 11/18/2024 0942 Gross per 24 hour  Intake 540 ml  Output 300 ml  Net 240 ml      Assessment/Plan:  58 y.o. female is 16 days postop, s/p: Left radial artery cutdown, radial artery and palmar arch Fogarty embolectomy, and left radial artery primary repair  -She has made significant progress and is feeling well this morning.  Primary plans on discharging to SNF today -Her left wrist incision is well-healed without signs of infection.  I have removed all staples -Her left hand remains well-perfused with a palpable radial pulse.  Her left thumb continues to demarcate -She will need outpatient hand surgery consultation for her left thumb.  She can follow-up with vascular as needed   Ahmed Holster, PA-C [facility] [phone] 11/18/2024 10:11 AM

## 2024-11-18 NOTE — Progress Notes (Signed)
 DISCHARGE NOTE HOME Morgan [name] to be discharged [facility] per MD order. Transported By [facility] , report called to Morgan [name] At [facility].     IV catheter discontinued intact. Site without signs and symptoms of complications. Dressing and pressure applied. Pt denies pain at the site currently. No complaints noted.      An After Visit Summary (AVS) was printed and place in discharge packet and sent VIA [facility].     Morgan Lango RN

## 2024-11-27 ENCOUNTER — Encounter: Payer: Self-pay | Admitting: Family Medicine

## 2024-11-27 ENCOUNTER — Encounter (HOSPITAL_BASED_OUTPATIENT_CLINIC_OR_DEPARTMENT_OTHER): Payer: Self-pay

## 2024-11-28 ENCOUNTER — Telehealth: Payer: Self-pay | Admitting: Pharmacy Technician

## 2024-11-28 ENCOUNTER — Other Ambulatory Visit (HOSPITAL_COMMUNITY): Payer: Self-pay

## 2024-11-28 MED ORDER — NEXLIZET 180-10 MG PO TABS: 1.0000 | ORAL_TABLET | Freq: Every day | ORAL | 3 refills | Status: AC

## 2024-11-28 NOTE — Telephone Encounter (Signed)
 Pharmacy Patient Advocate Encounter   Received notification from CoverMyMeds that prior authorization for Nexlizet  is required/requested.   Insurance verification completed.   The patient is insured through [employer].   Per test claim: PA required; PA submitted to above mentioned insurance via Latent Key/confirmation #/EOC [id number] Status is pending

## 2024-11-28 NOTE — Telephone Encounter (Signed)
 Pharmacy Patient Advocate Encounter  Received notification from [facility] that Prior Authorization for nexlizet  has been APPROVED from 11/28/24 to 05/29/25   PA #/Case ID/Reference #: [id number]

## 2024-11-29 ENCOUNTER — Encounter: Payer: Self-pay | Admitting: Family Medicine

## 2024-11-29 ENCOUNTER — Telehealth: Payer: Self-pay

## 2024-11-29 DIAGNOSIS — K5909 Other constipation: Secondary | ICD-10-CM

## 2024-11-29 MED ORDER — LINACLOTIDE 145 MCG PO CAPS: 145.0000 ug | ORAL_CAPSULE | Freq: Every day | ORAL | 3 refills | Status: AC

## 2024-11-29 NOTE — Telephone Encounter (Signed)
 Copied from CRM [id number]. Topic: General - Other >> Nov 29, 2024  1:47 PM Roselie BROCKS wrote: Reason for CRM: Izetta at [facility] requests a return call concerning the patient ,

## 2024-11-29 NOTE — Telephone Encounter (Signed)
 Returned [employer], left message for her to call back.

## 2024-12-02 ENCOUNTER — Encounter: Payer: Self-pay | Admitting: Family Medicine

## 2024-12-02 ENCOUNTER — Telehealth: Payer: Self-pay | Admitting: Family Medicine

## 2024-12-02 NOTE — Telephone Encounter (Signed)
 Sounds like she would benefit from a hosp follow up visit?

## 2024-12-02 NOTE — Telephone Encounter (Signed)
 Copied from CRM #[id number]. Topic: Clinical - Medical Advice >> Dec 02, 2024  9:04 AM Cherylann RAMAN wrote: Reason for CRM: Katie with [facility] called to confirm wound care for pressure ulcer for the patient. Please contact Katie at [phone] VM is confidential may leave detailed msg.

## 2024-12-03 ENCOUNTER — Ambulatory Visit (HOSPITAL_BASED_OUTPATIENT_CLINIC_OR_DEPARTMENT_OTHER): Admitting: Nurse Practitioner

## 2024-12-03 NOTE — Telephone Encounter (Signed)
 Returned [employer], Izetta stated that patient has a pressure ulcer that is healing nicely and by Friday may not require wound care. Izetta stated that she has been putting barrier cream and a bandage on the ulcer and patient has been up and about keeping pressure off of the ulcer.

## 2024-12-09 ENCOUNTER — Ambulatory Visit: Admitting: Family Medicine

## 2024-12-09 ENCOUNTER — Encounter: Payer: Self-pay | Admitting: Family Medicine

## 2024-12-09 VITALS — BP 135/73 | HR 68 | Temp 97.7°F | Ht 64.0 in | Wt 129.5 lb

## 2024-12-09 DIAGNOSIS — E1169 Type 2 diabetes mellitus with other specified complication: Secondary | ICD-10-CM

## 2024-12-09 DIAGNOSIS — Z8719 Personal history of other diseases of the digestive system: Secondary | ICD-10-CM | POA: Diagnosis not present

## 2024-12-09 DIAGNOSIS — Z9889 Other specified postprocedural states: Secondary | ICD-10-CM | POA: Diagnosis not present

## 2024-12-09 DIAGNOSIS — I5020 Unspecified systolic (congestive) heart failure: Secondary | ICD-10-CM | POA: Diagnosis not present

## 2024-12-09 DIAGNOSIS — I998 Other disorder of circulatory system: Secondary | ICD-10-CM | POA: Diagnosis not present

## 2024-12-09 LAB — CBC WITH DIFFERENTIAL/PLATELET
Basophils Absolute: 0.1 x10E3/uL (ref 0.0–0.2)
Basos: 1 %
EOS (ABSOLUTE): 0.1 x10E3/uL (ref 0.0–0.4)
Eos: 1 %
Hematocrit: 33.5 % — ABNORMAL LOW (ref 34.0–46.6)
Hemoglobin: 10 g/dL — ABNORMAL LOW (ref 11.1–15.9)
Immature Grans (Abs): 0 x10E3/uL (ref 0.0–0.1)
Immature Granulocytes: 0 %
Lymphocytes Absolute: 1.1 x10E3/uL (ref 0.7–3.1)
Lymphs: 18 %
MCH: 26.5 pg — ABNORMAL LOW (ref 26.6–33.0)
MCHC: 29.9 g/dL — ABNORMAL LOW (ref 31.5–35.7)
MCV: 89 fL (ref 79–97)
Monocytes Absolute: 0.3 x10E3/uL (ref 0.1–0.9)
Monocytes: 4 %
Neutrophils Absolute: 4.7 x10E3/uL (ref 1.4–7.0)
Neutrophils: 76 %
Platelets: 375 x10E3/uL (ref 150–450)
RBC: 3.78 x10E6/uL (ref 3.77–5.28)
RDW: 14.1 % (ref 11.7–15.4)
WBC: 6.2 x10E3/uL (ref 3.4–10.8)

## 2024-12-09 LAB — CMP14+EGFR
ALT: 12 IU/L (ref 0–32)
AST: 16 IU/L (ref 0–40)
Albumin: 3.3 g/dL — ABNORMAL LOW (ref 3.8–4.9)
Alkaline Phosphatase: 100 IU/L (ref 49–135)
BUN/Creatinine Ratio: 33 — ABNORMAL HIGH (ref 9–23)
BUN: 17 mg/dL (ref 6–24)
Bilirubin Total: <0.2 mg/dL (ref 0.0–1.2)
CO2: 26 mmol/L (ref 20–29)
Calcium: 9.3 mg/dL (ref 8.7–10.2)
Chloride: 102 mmol/L (ref 96–106)
Creatinine, Ser: 0.52 mg/dL — ABNORMAL LOW (ref 0.57–1.00)
Globulin, Total: 3 g/dL (ref 1.5–4.5)
Glucose: 81 mg/dL (ref 70–99)
Potassium: 4.6 mmol/L (ref 3.5–5.2)
Sodium: 144 mmol/L (ref 134–144)
Total Protein: 6.3 g/dL (ref 6.0–8.5)
eGFR: 108 mL/min/1.73

## 2024-12-09 LAB — BAYER DCA HB A1C WAIVED: HB A1C (BAYER DCA - WAIVED): 5.3 % (ref 4.8–5.6)

## 2024-12-09 MED ORDER — METOPROLOL SUCCINATE ER 25 MG PO TB24: 12.5000 mg | ORAL_TABLET | Freq: Every day | ORAL | 3 refills | Status: DC

## 2024-12-09 NOTE — Progress Notes (Signed)
 "  Subjective: CC: Hospital discharge follow-up PCP: [name], DO YEP:Morgan [name] is a 58 y.o. female presenting to clinic today for:  Patient here for hospital discharge follow-up after having had small bowel obstruction due to severe adhesions.  She underwent laparoscopic lysis of these adhesions with irrigation and debridement of her wound later due to complications she experienced in the hospital.  She also sustained a thrombosis to the left hand which caused severe hand ischemia in her left thumb and was treated with thrombectomy.  Uncertain if will require amputation per that note.  She was advised to follow-up with both general surgery and hand surgery in the outpatient setting.  She was discharged with wound care and sent to skilled nursing facility for recovery.  She is discharged with oral anticoagulation, metoprolol , losartan  and she notes that she held these starting 1 week ago due to uncertainty that she should continue them.  She will see general surgery on 12/19/2024, cardiology on 01/06/2025.  She notes that since discharge she has seen [name] with hand surgery and had tip of left thumb amputated on 12/03/2024.  She has been monitoring blood pressures closely and typically are running in the systolics of 90s up to 140s.  She discontinued losartan  as above.  She reports resolution of sacral wound that was identified with home health.  She reports good p.o. intake with normal bowel movements.  No blood in stool.  She feels like the wound is healing on the abdomen and she still has drain in place with no complications at this time.   ROS: Per HPI  Allergies[1] Past Medical History:  Diagnosis Date   Allergic rhinitis    Anemia     after gastric bypass in 2018   Anxiety    Barrett's esophagus    Bipolar affective disorder (HCC)    CAP (community acquired pneumonia) 07/20/2018   Chronic respiratory failure (HCC)    Constipation, chronic    Degenerative joint disease  of spine    Depression    Diabetes mellitus without complication (HCC)    type 2    DM (diabetes mellitus) (HCC)    hypoglycemic per pt - no longer takes DM meds due to weight loss from gastric bypass in 2018   Dry eye    Family history of adverse reaction to anesthesia    nausea and vomiting   Gastroparesis    GERD (gastroesophageal reflux disease)    H/O shoulder replacement 08/11/2020   left shoulder   History of bariatric surgery 03/2017   History of hiatal hernia    HLD (hyperlipidemia) 03/12/2013   Hyperlipidemia    Hypertension    No HTN meds since weight loss from Gastric Bypass in 2018   Intractable chronic migraine without aura 06/04/2015   Migraine headache    Morbid obesity (HCC)    OSA (obstructive sleep apnea)    No cpap since gastric surgery   Osteoarthritis    bilateral knee   SBO (small bowel obstruction) (HCC) 03/19/2019   Sleep apnea    Unspecified hypothyroidism    Vitamin B 12 deficiency    Vitamin D  deficiency    Current Medications[2] Social History   Socioeconomic History   Marital status: Single    Spouse name: Not on file   Number of children: 0   Years of education: HS   Highest education level: 12th grade  Occupational History   Occupation: disabled    Associate Professor: UNEMPLOYED  Tobacco Use  Smoking status: Never    Passive exposure: Past   Smokeless tobacco: Never  Vaping Use   Vaping status: Never Used  Substance and Sexual Activity   Alcohol  use: No   Drug use: No   Sexual activity: Never    Birth control/protection: Post-menopausal    Comment: LMP 10/2017  Other Topics Concern   Not on file  Social History Narrative   Patient is right handed.   Patient drinks 2 glasses of caffeine  daily.   Lives at home. Her niece is living with her right now.   Social Drivers of Health   Tobacco Use: Low Risk (10/29/2024)   Patient History    Smoking Tobacco Use: Never    Smokeless Tobacco Use: Never    Passive Exposure: Past  Financial  Resource Strain: Low Risk (12/06/2024)   Overall Financial Resource Strain (CARDIA)    Difficulty of Paying Living Expenses: Not hard at all  Recent Concern: Financial Resource Strain - Medium Risk (09/23/2024)   Overall Financial Resource Strain (CARDIA)    Difficulty of Paying Living Expenses: Somewhat hard  Food Insecurity: No Food Insecurity (12/06/2024)   Epic    Worried About Radiation Protection Practitioner of Food in the Last Year: Never true    Ran Out of Food in the Last Year: Never true  Transportation Needs: No Transportation Needs (12/06/2024)   Epic    Lack of Transportation (Medical): No    Lack of Transportation (Non-Medical): No  Physical Activity: Insufficiently Active (12/06/2024)   Exercise Vital Sign    Days of Exercise per Week: 3 days    Minutes of Exercise per Session: 30 min  Stress: Stress Concern Present (12/06/2024)   [employer] of Occupational Health - Occupational Stress Questionnaire    Feeling of Stress: To some extent  Social Connections: Moderately Isolated (12/06/2024)   Social Connection and Isolation Panel    Frequency of Communication with Friends and Family: More than three times a week    Frequency of Social Gatherings with Friends and Family: More than three times a week    Attends Religious Services: More than 4 times per year    Active Member of [employer] or Organizations: No    Attends Engineer, Structural: Not on file    Marital Status: Never married  Intimate Partner Violence: Patient Unable To Answer (11/02/2024)   Epic    Fear of Current or Ex-Partner: Patient unable to answer    Emotionally Abused: Patient unable to answer    Physically Abused: Patient unable to answer    Sexually Abused: Patient unable to answer  Depression (PHQ2-9): Medium Risk (06/28/2024)   Depression (PHQ2-9)    PHQ-2 Score: 9  Alcohol  Screen: Low Risk (09/23/2024)   Alcohol  Screen    Last Alcohol  Screening Score (AUDIT): 0  Housing: Unknown (12/06/2024)   Epic     Unable to Pay for Housing in the Last Year: No    Number of Times Moved in the Last Year: Not on file    Homeless in the Last Year: No  Utilities: Patient Unable To Answer (11/02/2024)   Epic    Threatened with loss of utilities: Patient unable to answer  Health Literacy: Adequate Health Literacy (09/23/2024)   B1300 Health Literacy    Frequency of need for help with medical instructions: Never   Family History  Problem Relation Age of Onset   Asthma Father    Allergies Father    Heart disease Father  enlarged heart   Peripheral vascular disease Father    Diabetes Father    Hyperlipidemia Father    Arthritis Father    Asthma Sister    Cancer Sister        colon at 82 yr old.   Colon cancer Sister    Allergies Mother    Stroke Mother 59       with hemi paralysis   Diabetes Mother    Hyperlipidemia Mother    Hypertension Mother    GI problems Mother    Arthritis Mother    Allergies Brother    Early death Brother 60       congenital abormality   Allergies Sister    Diabetes Sister    Asthma Sister    Colon polyps Sister    Hyperlipidemia Sister    GI problems Sister        gastroporesis    Liver disease Sister        fatty liver   Stroke Sister        intercrandial bleed   Diabetes Brother    Hypertension Brother    Hyperlipidemia Brother    Heart disease Paternal Aunt    Migraines Neg Hx     Objective: Office vital signs reviewed. BP 135/73   Pulse 68   Temp 97.7 F (36.5 C)   Ht 5' 4 (1.626 m)   Wt 129 lb 8 oz (58.7 kg)   LMP 10/17/2017   SpO2 95%   BMI 22.23 kg/m   Physical Examination:  General: Awake, alert, nontoxic but chronically ill-appearing female, No acute distress HEENT: Sclera white.  Moist mucous membranes.  Dentition fair Cardio: regular rate and rhythm, S1S2 heard, no murmurs appreciated Pulm: clear to auscultation bilaterally, no wheezes, rhonchi or rales; normal work of breathing on room air GI: Bandages in place in the  midline of the abdomen with drain in the left upper abdomen. Extremities: Left thumb with bandage in place.  She has healing ecchymosis along the radial aspect of the distal wrist.  Assessment/ Plan: 58 y.o. female   History of small bowel obstruction - Plan: CMP14+EGFR, CBC with Differential  S/P laparoscopy with lysis of adhesions - Plan: CMP14+EGFR, CBC with Differential  Ischemia of digits of hand - Plan: CMP14+EGFR, CBC with Differential  Type 2 diabetes mellitus with other specified complication, without long-term current use of insulin  (HCC) - Plan: Bayer DCA Hb A1c Waived, CMP14+EGFR  HFrEF (heart failure with reduced ejection fraction) (HCC) - Plan: metoprolol  succinate (TOPROL -XL) 25 MG 24 hr tablet   Check CMP, CBC and A1c so as to not have to come back in just a couple of weeks for diabetes checkup.  Keep appoint with endocrinology, cardiology and general surgery.  I reached out to her hand surgeon with questions about ongoing anticoagulation but they defer this to the vascular surgery who did the thrombectomy.  I am currently awaiting for response from them regarding switching her from Eliquis  to Xarelto and to determine duration of anticoagulation.  She seems to be healing well from a laparoscopic standpoint.  Blood pressure is controlled off of medication but we discussed given HFrEF it is advisable to at least continue the beta-blocker if not low-dose losartan .  My concern is that her blood pressures are so labile she may bottom out.  Keep appoint with cardiology as scheduled.   Norene CHRISTELLA Fielding, DO [facility] [phone]     [1]  Allergies Allergen Reactions  Ketoconazole Hives, Swelling and Other (See Comments)    SWELLING REACTION UNSPECIFIED    Pravachol  [Pravastatin  Sodium] Shortness Of Breath, Swelling and Anaphylaxis    Throat swelling   Statins Other (See Comments)    Leg cramps   Tape Dermatitis and Other (See Comments)     Steri-Strips   Repatha  [Evolocumab ] Other (See Comments)    Made the stomach hurt badly.   Codeine Other (See Comments) and Hypertension    Increased B/P  [2]  Current Outpatient Medications:    acetaminophen  (TYLENOL ) 325 MG tablet, Take 2 tablets (650 mg total) by mouth every 4 (four) hours as needed for mild pain (pain score 1-3) or moderate pain (pain score 4-6)., Disp: , Rfl:    Bempedoic Acid-Ezetimibe  (NEXLIZET ) 180-10 MG TABS, Take 1 tablet by mouth daily at 8 pm., Disp: 90 tablet, Rfl: 3   Calcium -Vitamin D -Vitamin K  650-12.5-40 MG-MCG-MCG CHEW, Chew 1 each by mouth 2 (two) times daily., Disp: , Rfl:    Continuous Glucose Sensor (DEXCOM G7 SENSOR) MISC, Inject 1 Device into the skin every 14 (fourteen) days., Disp: , Rfl:    cyclobenzaprine  (FLEXERIL ) 10 MG tablet, Take 1 tablet (10 mg total) by mouth 3 (three) times daily as needed for muscle spasms., Disp: 90 tablet, Rfl: 0   desloratadine  (CLARINEX ) 5 MG tablet, TAKE ONE TABLET DAILY, Disp: 90 tablet, Rfl: 1   dicyclomine  (BENTYL ) 20 MG tablet, TAKE 1 TABLET EVERY 8 HOURS AS NEEDED FOR ABDOMINAL CRAMPING, Disp: 30 tablet, Rfl: 5   Erenumab -aooe 140 MG/ML SOAJ, Inject 140 mg into the skin every 30 (thirty) days., Disp: 3 mL, Rfl: 3   gabapentin  (NEURONTIN ) 300 MG capsule, Takes 2 capsules in morning, one cap at lunch, 2 caps at bedtime, Disp: 450 capsule, Rfl: 3   Glucagon  3 MG/DOSE POWD, Place 3 mg into the nose once as needed (for a severe diabetic emergency)., Disp: , Rfl:    glucose blood (ONETOUCH ULTRA) test strip, Check BS four times a day Dx E11.9, Disp: 400 strip, Rfl: 3   levothyroxine  (SYNTHROID ) 112 MCG tablet, TAKE ONE TABLET ONCE DAILY BEFORE BREAKFAST (Patient taking differently: Take 112 mcg by mouth daily before breakfast.), Disp: 90 tablet, Rfl: 3   linaclotide  (LINZESS ) 145 MCG CAPS capsule, Take 1 capsule (145 mcg total) by mouth daily before breakfast., Disp: 90 capsule, Rfl: 3   lip balm (CARMEX) ointment,  Apply topically as needed., Disp: , Rfl:    mometasone  (NASONEX ) 50 MCG/ACT nasal spray, USE 2 SPRAYS IN EACH NOSTRIL ONCE DAILY (REPLACES FLONASE ), Disp: 17 g, Rfl: 4   Multiple Vitamins-Minerals (BARIATRIC MULTIVITAMINS/IRON  PO), Take 1 tablet by mouth daily., Disp: , Rfl:    MYRBETRIQ  25 MG TB24 tablet, TAKE ONE TABLET DAILY, Disp: 90 tablet, Rfl: 1   oxyCODONE  (OXY IR/ROXICODONE ) 5 MG immediate release tablet, Take 1 tablet (5 mg total) by mouth every 4 (four) hours as needed for severe pain (pain score 7-10) or breakthrough pain., Disp: 30 tablet, Rfl: 0   pantoprazole  (PROTONIX ) 40 MG tablet, TAKE ONE TABLET DAILY, Disp: 90 tablet, Rfl: 1   polyethylene glycol (MIRALAX  / GLYCOLAX ) packet, Take 17 g by mouth at bedtime., Disp: , Rfl:    Probiotic Product (PROBIOTIC DAILY PO), Take 1 capsule by mouth daily., Disp: , Rfl:    sertraline  (ZOLOFT ) 100 MG tablet, TAKE TWO TABLETS DAILY AS DIRECTED (Patient taking differently: Take 200 mg by mouth daily.), Disp: 180 tablet, Rfl: 1   Simethicone  (GAS-X EXTRA STRENGTH) 125  MG CAPS, Take 1 capsule (125 mg total) by mouth 3 (three) times daily as needed (gas)., Disp: , Rfl:    SUMAtriptan  (IMITREX ) 100 MG tablet, Take 1 tablet (100 mg total) by mouth once as needed for up to 1 dose for migraine. May repeat in 2 hours if headache persists or recurs., Disp: 10 tablet, Rfl: 11   apixaban  (ELIQUIS ) 5 MG TABS tablet, Take 1 tablet (5 mg total) by mouth 2 (two) times daily. (Patient not taking: Reported on 12/09/2024), Disp: , Rfl:    ascorbic acid  (VITAMIN C ) 500 MG tablet, Take 1 tablet (500 mg total) by mouth 2 (two) times daily. (Patient not taking: Reported on 12/09/2024), Disp: , Rfl:    Evolocumab  (REPATHA  SURECLICK) 140 MG/ML SOAJ, Inject 140 mg into the skin every 14 (fourteen) days. (Patient not taking: Reported on 12/09/2024), Disp: 6 mL, Rfl: 3   furosemide  (LASIX ) 40 MG tablet, Take 1 tablet (40 mg total) by mouth daily. (Patient not taking: Reported  on 12/09/2024), Disp: , Rfl:    Glucagon , rDNA, (GLUCAGON  EMERGENCY IJ), Inject 1 Syringe as directed daily as needed (emergency low blood sugar).  (Patient not taking: Reported on 12/09/2024), Disp: , Rfl:    losartan  (COZAAR ) 25 MG tablet, Take 1 tablet (25 mg total) by mouth daily. (Patient not taking: Reported on 12/09/2024), Disp: , Rfl:    metoprolol  succinate (TOPROL -XL) 25 MG 24 hr tablet, Take 0.5 tablets (12.5 mg total) by mouth at bedtime. (Patient not taking: Reported on 12/09/2024), Disp: , Rfl:    Nutritional Supplements (FEEDING SUPPLEMENT, KATE FARMS STANDARD ENT 1.4,) LIQD liquid, Take 325 mLs by mouth 2 (two) times daily between meals. (Patient not taking: Reported on 12/09/2024), Disp: , Rfl:    Nutritional Supplements (FEEDING SUPPLEMENT, OSMOLITE 1.5 CAL,) LIQD, Place 237 mLs into feeding tube 2 (two) times daily. (Patient not taking: Reported on 12/09/2024), Disp: , Rfl:    ondansetron  (ZOFRAN ) 4 MG tablet, Take 1 tablet (4 mg total) by mouth every 6 (six) hours as needed for nausea or vomiting. (Patient not taking: Reported on 12/09/2024), Disp: , Rfl:    Protein (FEEDING SUPPLEMENT, PROSOURCE TF20,) liquid, Place 60 mLs into feeding tube 2 (two) times daily. (Patient not taking: Reported on 12/09/2024), Disp: , Rfl:    senna-docusate (SENOKOT-S) 8.6-50 MG tablet, Take 1 tablet by mouth at bedtime. (Patient not taking: Reported on 12/09/2024), Disp: , Rfl:    vitamin A  3 MG (10000 UNITS) capsule, Take 1 capsule (10,000 Units total) by mouth daily for 23 days. (Patient not taking: Reported on 12/09/2024), Disp: , Rfl:    Water  For Irrigation, Sterile (FREE WATER ) SOLN, Place 30 mLs into feeding tube every 4 (four) hours. (Patient not taking: Reported on 12/09/2024), Disp: , Rfl:    zinc  sulfate, 50mg  elemental zinc , 220 (50 Zn) MG capsule, Take 1 capsule (220 mg total) by mouth daily for 27 days. (Patient not taking: Reported on 12/09/2024), Disp: , Rfl:    ziprasidone  (GEODON ) 60  MG capsule, TAKE ONE CAPSULE AT BEDTIME (Patient taking differently: Take 60 mg by mouth See admin instructions. Take 60 mg by mouth at bedtime), Disp: 90 capsule, Rfl: 1  "

## 2024-12-10 ENCOUNTER — Ambulatory Visit: Payer: Self-pay | Admitting: Family Medicine

## 2024-12-10 ENCOUNTER — Encounter: Payer: Self-pay | Admitting: Family Medicine

## 2024-12-17 ENCOUNTER — Encounter: Payer: Self-pay | Admitting: Family Medicine

## 2024-12-17 DIAGNOSIS — E1169 Type 2 diabetes mellitus with other specified complication: Secondary | ICD-10-CM

## 2024-12-17 DIAGNOSIS — I5020 Unspecified systolic (congestive) heart failure: Secondary | ICD-10-CM

## 2024-12-20 ENCOUNTER — Telehealth: Payer: Self-pay | Admitting: Family Medicine

## 2024-12-20 ENCOUNTER — Other Ambulatory Visit

## 2024-12-20 DIAGNOSIS — M4306 Spondylolysis, lumbar region: Secondary | ICD-10-CM

## 2024-12-20 DIAGNOSIS — Z01419 Encounter for gynecological examination (general) (routine) without abnormal findings: Secondary | ICD-10-CM

## 2024-12-20 DIAGNOSIS — E1169 Type 2 diabetes mellitus with other specified complication: Secondary | ICD-10-CM

## 2024-12-20 DIAGNOSIS — Z9889 Other specified postprocedural states: Secondary | ICD-10-CM

## 2024-12-20 DIAGNOSIS — I5020 Unspecified systolic (congestive) heart failure: Secondary | ICD-10-CM

## 2024-12-20 DIAGNOSIS — E039 Hypothyroidism, unspecified: Secondary | ICD-10-CM

## 2024-12-20 DIAGNOSIS — G43109 Migraine with aura, not intractable, without status migrainosus: Secondary | ICD-10-CM

## 2024-12-20 NOTE — Telephone Encounter (Signed)
 Per insurance pt is requesting referrals to   Rosaline Bane, AGNP, MSN Specialties Cardiology, Nurse Practitioner  Donna Jama Just, DO  Dr. Greig Lomax  Amy Macario Forbes, NP  Delon ALONSO Butts, MD Endocrinologist, Diabetes and Metabolism Specialist  DOROTHA Robynn Rias, MD Specialties Orthopedic Surgery  Camellia Daralyn Blush, MD Specialties Bariatric Surgery, General Surgery  Dr. Fonda Olmsted    Orthopedic Surgeon

## 2024-12-20 NOTE — Telephone Encounter (Signed)
 Referrals placed

## 2024-12-24 ENCOUNTER — Encounter: Payer: Self-pay | Admitting: Family Medicine

## 2024-12-24 DIAGNOSIS — M4300 Spondylolysis, site unspecified: Secondary | ICD-10-CM

## 2024-12-28 ENCOUNTER — Other Ambulatory Visit: Payer: Self-pay | Admitting: Family Medicine

## 2024-12-28 DIAGNOSIS — F3170 Bipolar disorder, currently in remission, most recent episode unspecified: Secondary | ICD-10-CM

## 2024-12-31 ENCOUNTER — Ambulatory Visit: Payer: Self-pay | Admitting: Family Medicine

## 2024-12-31 ENCOUNTER — Ambulatory Visit

## 2025-01-01 ENCOUNTER — Telehealth (HOSPITAL_BASED_OUTPATIENT_CLINIC_OR_DEPARTMENT_OTHER): Payer: Self-pay | Admitting: Nurse Practitioner

## 2025-01-01 NOTE — Telephone Encounter (Signed)
 Patient possibly wants to reschedule appt for 01/26 with Rosaline Bane due to the possible bad weather, patient is concerned because next availability would be 04/10/25, patient would like to speak to a nurse before rescheduling appt.

## 2025-01-01 NOTE — Telephone Encounter (Signed)
 Agree with plan on holding metoprolol  and monitoring home BP and HR. She was prescribed Eliquis  during hospitalization for radial artery thrombosis and was advised to follow-up with vascular surgery. Would recommend she talk to them about recommendations. Recommend not stopping Nexletol at this time given prior intolerances. It is not likely to cause joint pain and she has had a lot of other complications recently that could be contributing to pain. Good hydration and avoiding prolonged sitting may be beneficial for joint pain.

## 2025-01-01 NOTE — Telephone Encounter (Signed)
 Spoke w patient and rescheduled her due to potential weather next Monday.  She reported to me that her BP has been running low especially in the mornings, 87/59 today.  HR 60. She is taking Toprol  12.5 mg daily at bedtime.    BP will increase during the day to 109/69, 126/76 but when low she is dizzy feeling and very weak and fatigued.  I asked her to hold Torprol XL for now and record daily blood pressures and send the readings in next Wed via my chart.  She is not taking losartan .  Also not taking Eliquis .  She said when she was discharged she wasn't told what any of the medications were for.  She took one dose of Eliquis  and it made her feel weak and tired so stopped and one of her other doctors prescribed aspirin  325 mg which she is taking daily.  Also would like to discuss cholesterol medication because her ankles and other joints are hurting.  Routing to [place] to make aware and for any other recommendations to relay to to the patient.

## 2025-01-02 NOTE — Telephone Encounter (Signed)
 Called patient again and relayed the message from M. Swinyer.

## 2025-01-06 ENCOUNTER — Ambulatory Visit (HOSPITAL_BASED_OUTPATIENT_CLINIC_OR_DEPARTMENT_OTHER): Admitting: Nurse Practitioner

## 2025-01-08 ENCOUNTER — Encounter (HOSPITAL_BASED_OUTPATIENT_CLINIC_OR_DEPARTMENT_OTHER): Payer: Self-pay

## 2025-01-13 ENCOUNTER — Other Ambulatory Visit: Payer: Self-pay | Admitting: Family Medicine

## 2025-01-13 DIAGNOSIS — K219 Gastro-esophageal reflux disease without esophagitis: Secondary | ICD-10-CM

## 2025-01-15 NOTE — Progress Notes (Unsigned)
 " Cardiology Office Note:  .   Date:  01/17/2025 ID:  Morgan [name], DOB 1966-03-30, MRN [id number] PCP: [name], DO [facility] Providers Cardiologist:  None   Patient Profile: .      PMH Coronary artery calcification CT Calcium  score 07/22/24 CAC Score 176 (97th percentile) LM 0, LAD 89.7, LCx 0, RCA 85.9 Coronary CTA 10/02/24 CAC 315 (98th percentile) Mild mixed nonobstructive CAD Low risk stress test 09/2016 OSA on CPAP Hyperlipidemia Aortic atherosclerosis Gastroparesis s/p gastric bypass surgery  Seen in 2017 by Dr. Raford for cardiac clearance for bariatric surgery.  She had low risk Myoview  09/2016 and echo 07/2018 that revealed normal LVEF 55 to 60%, G1 DD, no significant valve disease.  Referred to cardiology for evaluation of coronary artery calcification and seen by me on 09/23/2024.  Recent CT calcium  score of 176, with calcification primarily in the left anterior descending artery and the right coronary artery, placing her in 97th percentile for age/sex matched controls. She is active with caring for 2 young children and does some walking outside. Exercises in her nephew's pool during summer months and engages in online exercises several days per week. Has been unable to tolerate statins due to myalgia and also had an allergic reaction to pravastatin . Currently on Zetia  and tolerating well. Family history is significant for cardiovascular disease, with her father having high cholesterol and her mother having a myocardial infarction at age 24. Diet includes air-fried meats and avoidance of fast food. Uses Pam spray instead of vegetable oil and does not bread her chicken. Often consumes pork sausage, eggs, or bologna for breakfast. She is hypoglycemic and experiences low blood sugar episodes, limiting her intake of certain carbohydrates. Notes significant spikes in blood sugar followed by hypoglycemia when eating instant oatmeal, but she has not tried  consuming complex carbohydrates and monitoring blood sugar changes afterwards.Occasional lightheadedness, which she attributes to her medications. BP was recently noted to be 117/80. History of gastroparesis, which prevents her from taking aspirin  or NSAIDs. No chest pain, shortness of breath, orthopnea, PND, edema, presyncope, syncope, or palpitations.  Prior history of hypertension, however she has been off antihypertensive medications since gastric bypass surgery.  She underwent coronary CTA 10/02/2024 which revealed mild mixed proximal stenosis 25 to 49% in LAD, minimal mixed proximal stenosis 1 to 24% in RCA.     Admission 11/2024 with abdominal pain, with septic shock due to perforated SBO with peritonitis.  Exploratory lap found to have dense adhesive band obstructing proximal small bowel as well as biliary leak w/out evidence of perforation.  She had acute HFrEF with EF 20-25% with recovered EF. TTE 11/11/24 with LVEF 60-65%, no rwma, normal RV, no significant valve disease.  She had radial artery thrombosis during admission and was started on Eliquis .  She is being followed by vascular surgery.  She is holding Toprol  XL in the setting of soft BP.  She was advised to start Nexlizet  108-10 mg daily for lipid-lowering therapy.    History of Present Illness: .   Discussed the use of AI scribe software for clinical note transcription with the patient, who gave verbal consent to proceed.  History of Present Illness Morgan [name] is a very pleasant 59 year old female who is here today for follow-up of  hyperlipidemia and coronary artery calcification. She has been feeling well since her hospitalization. Blood sugar has stabilized and she is regaining energy. She stopped Eliquis  secondary to fatigue and sick feeling and was  advised by vascular to continue aspirin  325 mg daily. 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 During her admission, she did not have any chest pain. She had what was thought  to be stress-induced cardiomyopathy from sepsis and was evaluated by cardiology during her admission.  Repeat echo 11/11/2024 reveals normalized EF 60 to 65%, no RWMA, normal RV, no significant valve disease.  She denies shortness of breath, orthopnea, PND, edema, presyncope, syncope. Since discharge she has monitored blood pressure at home twice weekly. Metoprolol  is currently held due to low readings.  Average BP at this time is around 120/70 mmHg.  She had flulike symptoms with Repatha  but is tolerating Nexlizet  without concerning side effects. She plans cholesterol testing on March 2nd. She remains moderately active despite knee and shoulder arthritis and uses light weights and pool walking when able. Her diet focuses on carbohydrate control. She is trying to regain weight after prior loss.   Lipoprotein (a)  Date/Time Value Ref Range Status  09/23/2024 10:30 AM 16.4 <75.0 nmol/L Final    Comment:    Note:  Values greater than or equal to 75.0 nmol/L may        indicate an independent risk factor for CHD,        but must be evaluated with caution when applied        to non-Caucasian populations due to the        influence of genetic factors on Lp(a) across        ethnicities.      ROS: See HPI       Studies Reviewed: .          Risk Assessment/Calculations:             Physical Exam:   VS: BP 126/70 (BP Location: Right Arm, Patient Position: Sitting, Cuff Size: Normal)   Pulse 71   Ht 5' 4 (1.626 m)   Wt 128 lb 9.6 oz (58.3 kg)   LMP 10/17/2017   SpO2 96%   BMI 22.07 kg/m   Wt Readings from Last 3 Encounters:  01/16/25 128 lb 9.6 oz (58.3 kg)  12/09/24 129 lb 8 oz (58.7 kg)  11/12/24 148 lb 13 oz (67.5 kg)     GEN: Well nourished, well developed in no acute distress NECK: No JVD; No carotid bruits CARDIAC: RRR, no murmurs, rubs, gallops RESPIRATORY:  Clear to auscultation without rales, wheezing or rhonchi  ABDOMEN: Soft, non-tender, non-distended EXTREMITIES:  No  edema; No deformity     ASSESSMENT AND PLAN: .     Assessment & Plan Coronary artery disease Abnormal EKG Coronary CTA 10/02/2024 with mild mixed nonobstructive CAD, CAC 315 (98th percentile) and aortic atherosclerosis.  EKG at initial visit 09/23/2024 revealed low voltage QRS which has been present on a previous EKG.  She is generally active and denies chest pain, dyspnea, or other symptoms concerning for angina. Family history is significant for stroke in her mother at age 81, and heart disease in both her mother's and father's families.  LDL was elevated at 145 and she was started on Repatha , however she did not tolerate it with it causing flulike symptoms.  She is tolerating Nexlizet  at without concerning side effect.  No indication for further ischemia evaluation at this time.  BP is well-controlled.  We are rechecking lipids in a few weeks.  She is now on high-dose aspirin  per vascular surgery for thrombosed radial artery during hospitalization. BP is well controlled without anti-hypertensive therapy.  -  Continue aspirin , Nexlizet   Hyperlipidemia LDL goal < 70 Statin intolerance Lipid profile 06/28/2024 with total cholesterol 237, triglycerides 117, HDL 72, LDL-C 145. History of statin myalgia and allergic reaction to pravastatin . Zetia  was well tolerated. Family history of high cholesterol and early cardiovascular events. She did not tolerate Repatha  with flu-like symptoms. Tolerating Nexlizet  without concerning side effects. LPa is negative.  - Repeat lipids in 3-4 weeks  - Continue Nexlizet  daily  Type 2 diabetes mellitus A1C 5.3% on 12/09/24. She is limiting simple carbohydrates and sugar. Is using Dexcom for monitoring blood sugar.  -Encourage dietary modifications to reduce saturated fat - Eat a mostly whole food diet avoiding saturated fat, processed foods, sugar, and other simple carbohydrates  -Aim for at least 150 minutes of moderate exercise weekly, including walking and pool  exercises -Management per PCP        Dispo: 1 year with me  Signed, Rosaline Bane, NP-C "

## 2025-01-16 ENCOUNTER — Ambulatory Visit (HOSPITAL_BASED_OUTPATIENT_CLINIC_OR_DEPARTMENT_OTHER): Admitting: Nurse Practitioner

## 2025-01-16 VITALS — BP 126/70 | HR 71 | Ht 64.0 in | Wt 128.6 lb

## 2025-01-16 DIAGNOSIS — E118 Type 2 diabetes mellitus with unspecified complications: Secondary | ICD-10-CM

## 2025-01-16 DIAGNOSIS — E785 Hyperlipidemia, unspecified: Secondary | ICD-10-CM

## 2025-01-16 DIAGNOSIS — I2510 Atherosclerotic heart disease of native coronary artery without angina pectoris: Secondary | ICD-10-CM

## 2025-01-16 DIAGNOSIS — Z789 Other specified health status: Secondary | ICD-10-CM

## 2025-01-16 DIAGNOSIS — R931 Abnormal findings on diagnostic imaging of heart and coronary circulation: Secondary | ICD-10-CM

## 2025-01-16 NOTE — Patient Instructions (Signed)
 Medication Instructions:   Your physician recommends that you continue on your current medications as directed. Please refer to the Current Medication list given to you today.   *If you need a refill on your cardiac medications before your next appointment, please call your pharmacy*  Lab Work:  In March please get your fasting lab work, fasting after midnight.   If you have labs (blood work) drawn today and your tests are completely normal, you will receive your results only by: MyChart Message (if you have MyChart) OR A paper copy in the mail If you have any lab test that is abnormal or we need to change your treatment, we will call you to review the results.  Testing/Procedures:  None ordered.  Follow-Up: At [facility], you and your health needs are our priority.  As part of our continuing mission to provide you with exceptional heart care, our providers are all part of one team.  This team includes your primary Cardiologist (physician) and Advanced Practice Providers or APPs (Physician Assistants and Nurse Practitioners) who all work together to provide you with the care you need, when you need it.  Your next appointment:   1 year(s)  Provider:   Rosaline Bane, NP    We recommend signing up for the patient portal called MyChart.  Sign up information is provided on this After Visit Summary.  MyChart is used to connect with patients for Virtual Visits (Telemedicine).  Patients are able to view lab/test results, encounter notes, upcoming appointments, etc.  Non-urgent messages can be sent to your provider as well.   To learn more about what you can do with MyChart, go to [email or web address].   Other Instructions  Your physician wants you to follow-up in: 1 year.  You will receive a reminder letter in the mail two months in advance. If you don't receive a letter, please call our office to schedule the follow-up appointment.

## 2025-01-17 ENCOUNTER — Encounter (HOSPITAL_BASED_OUTPATIENT_CLINIC_OR_DEPARTMENT_OTHER): Payer: Self-pay | Admitting: Nurse Practitioner

## 2025-02-04 ENCOUNTER — Telehealth: Admitting: Family Medicine

## 2025-02-10 ENCOUNTER — Other Ambulatory Visit

## 2025-03-28 ENCOUNTER — Ambulatory Visit: Payer: Self-pay

## 2025-07-01 ENCOUNTER — Ambulatory Visit: Admitting: Family Medicine
